# Patient Record
Sex: Male | Born: 1955
Health system: Southern US, Community
[De-identification: ages and names within clinical notes are randomized; demographics above are authoritative.]

## PROBLEM LIST (undated history)

## (undated) DIAGNOSIS — R569 Unspecified convulsions: Secondary | ICD-10-CM

## (undated) DIAGNOSIS — E039 Hypothyroidism, unspecified: Secondary | ICD-10-CM

## (undated) DIAGNOSIS — E109 Type 1 diabetes mellitus without complications: Secondary | ICD-10-CM

## (undated) DIAGNOSIS — N289 Disorder of kidney and ureter, unspecified: Secondary | ICD-10-CM

## (undated) DIAGNOSIS — F329 Major depressive disorder, single episode, unspecified: Secondary | ICD-10-CM

## (undated) DIAGNOSIS — M4800 Spinal stenosis, site unspecified: Secondary | ICD-10-CM

## (undated) DIAGNOSIS — J45909 Unspecified asthma, uncomplicated: Secondary | ICD-10-CM

## (undated) DIAGNOSIS — E785 Hyperlipidemia, unspecified: Secondary | ICD-10-CM

## (undated) DIAGNOSIS — J209 Acute bronchitis, unspecified: Secondary | ICD-10-CM

## (undated) DIAGNOSIS — J189 Pneumonia, unspecified organism: Secondary | ICD-10-CM

## (undated) DIAGNOSIS — N529 Male erectile dysfunction, unspecified: Secondary | ICD-10-CM

## (undated) DIAGNOSIS — G5600 Carpal tunnel syndrome, unspecified upper limb: Secondary | ICD-10-CM

## (undated) DIAGNOSIS — N320 Bladder-neck obstruction: Secondary | ICD-10-CM

## (undated) DIAGNOSIS — M5090 Cervical disc disorder, unspecified, unspecified cervical region: Secondary | ICD-10-CM

## (undated) DIAGNOSIS — I2510 Atherosclerotic heart disease of native coronary artery without angina pectoris: Secondary | ICD-10-CM

## (undated) DIAGNOSIS — Z87442 Personal history of urinary calculi: Secondary | ICD-10-CM

## (undated) DIAGNOSIS — E1029 Type 1 diabetes mellitus with other diabetic kidney complication: Secondary | ICD-10-CM

## (undated) DIAGNOSIS — E1039 Type 1 diabetes mellitus with other diabetic ophthalmic complication: Secondary | ICD-10-CM

## (undated) DIAGNOSIS — F32A Depression, unspecified: Secondary | ICD-10-CM

## (undated) DIAGNOSIS — E08319 Diabetes mellitus due to underlying condition with unspecified diabetic retinopathy without macular edema: Secondary | ICD-10-CM

## (undated) DIAGNOSIS — E1065 Type 1 diabetes mellitus with hyperglycemia: Secondary | ICD-10-CM

## (undated) DIAGNOSIS — F419 Anxiety disorder, unspecified: Secondary | ICD-10-CM

## (undated) DIAGNOSIS — J449 Chronic obstructive pulmonary disease, unspecified: Secondary | ICD-10-CM

## (undated) DIAGNOSIS — F411 Generalized anxiety disorder: Secondary | ICD-10-CM

## (undated) DIAGNOSIS — J42 Unspecified chronic bronchitis: Secondary | ICD-10-CM

## (undated) HISTORY — DX: Disorder of kidney and ureter, unspecified: N28.9

## (undated) HISTORY — DX: Male erectile dysfunction, unspecified: N52.9

## (undated) HISTORY — DX: Carpal tunnel syndrome, unspecified upper limb: G56.00

## (undated) HISTORY — DX: Type 1 diabetes mellitus with other diabetic ophthalmic complication: E10.39

## (undated) HISTORY — PX: LYMPH NODE DISSECTION: SHX5087

## (undated) HISTORY — DX: Cervical disc disorder, unspecified, unspecified cervical region: M50.90

## (undated) HISTORY — PX: VITRECTOMY: SHX106

## (undated) HISTORY — PX: APPENDECTOMY: SHX54

## (undated) HISTORY — PX: CATARACT EXTRACTION W/ INTRAOCULAR LENS  IMPLANT, BILATERAL: SHX1307

## (undated) HISTORY — DX: Major depressive disorder, single episode, unspecified: F32.9

## (undated) HISTORY — DX: Acute bronchitis, unspecified: J20.9

## (undated) HISTORY — DX: Hyperlipidemia, unspecified: E78.5

## (undated) HISTORY — DX: Type 1 diabetes mellitus without complications: E10.9

## (undated) HISTORY — DX: Atherosclerotic heart disease of native coronary artery without angina pectoris: I25.10

## (undated) HISTORY — DX: Bladder-neck obstruction: N32.0

## (undated) HISTORY — DX: Type 1 diabetes mellitus with other diabetic kidney complication: E10.29

## (undated) HISTORY — DX: Type 1 diabetes mellitus with hyperglycemia: E10.65

## (undated) HISTORY — PX: TONSILLECTOMY: SUR1361

## (undated) HISTORY — DX: Generalized anxiety disorder: F41.1

## (undated) HISTORY — PX: BACK SURGERY: SHX140

## (undated) HISTORY — DX: Unspecified asthma, uncomplicated: J45.909

---

## 1989-03-19 HISTORY — PX: CARDIAC CATHETERIZATION: SHX172

## 1998-06-05 ENCOUNTER — Ambulatory Visit (HOSPITAL_COMMUNITY): Admission: RE | Admit: 1998-06-05 | Discharge: 1998-06-05 | Payer: Self-pay | Admitting: Rheumatology

## 1998-06-05 ENCOUNTER — Encounter: Payer: Self-pay | Admitting: Rheumatology

## 1998-07-19 HISTORY — PX: ANTERIOR CERVICAL DECOMP/DISCECTOMY FUSION: SHX1161

## 2000-06-03 ENCOUNTER — Emergency Department (HOSPITAL_COMMUNITY): Admission: EM | Admit: 2000-06-03 | Discharge: 2000-06-03 | Payer: Self-pay | Admitting: Emergency Medicine

## 2000-06-03 ENCOUNTER — Encounter: Payer: Self-pay | Admitting: Emergency Medicine

## 2000-06-09 ENCOUNTER — Encounter: Payer: Self-pay | Admitting: Emergency Medicine

## 2000-06-09 ENCOUNTER — Inpatient Hospital Stay (HOSPITAL_COMMUNITY): Admission: EM | Admit: 2000-06-09 | Discharge: 2000-06-11 | Payer: Self-pay | Admitting: Emergency Medicine

## 2000-06-10 ENCOUNTER — Encounter: Payer: Self-pay | Admitting: Emergency Medicine

## 2000-06-10 ENCOUNTER — Encounter: Payer: Self-pay | Admitting: Internal Medicine

## 2000-06-16 ENCOUNTER — Emergency Department (HOSPITAL_COMMUNITY): Admission: EM | Admit: 2000-06-16 | Discharge: 2000-06-16 | Payer: Self-pay | Admitting: *Deleted

## 2000-06-16 ENCOUNTER — Encounter: Payer: Self-pay | Admitting: Neurological Surgery

## 2000-12-26 ENCOUNTER — Encounter: Admission: RE | Admit: 2000-12-26 | Discharge: 2001-02-15 | Payer: Self-pay | Admitting: Endocrinology

## 2002-09-06 HISTORY — PX: OTHER SURGICAL HISTORY: SHX169

## 2002-09-28 ENCOUNTER — Encounter: Payer: Self-pay | Admitting: Cardiology

## 2002-09-28 ENCOUNTER — Ambulatory Visit (HOSPITAL_COMMUNITY): Admission: RE | Admit: 2002-09-28 | Discharge: 2002-09-28 | Payer: Self-pay | Admitting: Cardiology

## 2002-11-07 ENCOUNTER — Encounter: Admission: RE | Admit: 2002-11-07 | Discharge: 2003-02-05 | Payer: Self-pay | Admitting: Endocrinology

## 2004-06-24 ENCOUNTER — Ambulatory Visit: Payer: Self-pay | Admitting: Endocrinology

## 2004-08-24 ENCOUNTER — Ambulatory Visit: Payer: Self-pay | Admitting: Endocrinology

## 2004-08-25 ENCOUNTER — Ambulatory Visit: Payer: Self-pay | Admitting: Endocrinology

## 2004-08-31 ENCOUNTER — Ambulatory Visit: Payer: Self-pay | Admitting: Endocrinology

## 2004-10-06 ENCOUNTER — Ambulatory Visit: Payer: Self-pay | Admitting: Endocrinology

## 2004-10-08 ENCOUNTER — Ambulatory Visit: Payer: Self-pay | Admitting: Endocrinology

## 2004-10-13 ENCOUNTER — Ambulatory Visit: Payer: Self-pay | Admitting: Endocrinology

## 2004-11-18 ENCOUNTER — Ambulatory Visit: Payer: Self-pay | Admitting: Internal Medicine

## 2004-11-23 ENCOUNTER — Ambulatory Visit: Payer: Self-pay | Admitting: Endocrinology

## 2005-02-23 ENCOUNTER — Ambulatory Visit: Payer: Self-pay | Admitting: Internal Medicine

## 2005-03-10 ENCOUNTER — Ambulatory Visit: Payer: Self-pay | Admitting: Endocrinology

## 2005-03-11 ENCOUNTER — Ambulatory Visit: Payer: Self-pay | Admitting: Endocrinology

## 2005-05-30 ENCOUNTER — Emergency Department (HOSPITAL_COMMUNITY): Admission: EM | Admit: 2005-05-30 | Discharge: 2005-05-30 | Payer: Self-pay | Admitting: Family Medicine

## 2005-06-03 ENCOUNTER — Ambulatory Visit: Payer: Self-pay | Admitting: Pulmonary Disease

## 2005-06-07 ENCOUNTER — Ambulatory Visit: Payer: Self-pay | Admitting: Endocrinology

## 2005-06-21 ENCOUNTER — Ambulatory Visit: Payer: Self-pay | Admitting: Endocrinology

## 2005-07-06 ENCOUNTER — Ambulatory Visit: Payer: Self-pay | Admitting: Critical Care Medicine

## 2005-07-08 ENCOUNTER — Ambulatory Visit: Payer: Self-pay | Admitting: Critical Care Medicine

## 2005-08-24 ENCOUNTER — Ambulatory Visit: Payer: Self-pay | Admitting: Endocrinology

## 2005-08-25 ENCOUNTER — Ambulatory Visit: Payer: Self-pay | Admitting: Endocrinology

## 2005-09-15 ENCOUNTER — Ambulatory Visit: Payer: Self-pay | Admitting: Internal Medicine

## 2005-11-22 ENCOUNTER — Ambulatory Visit: Payer: Self-pay | Admitting: Endocrinology

## 2005-12-08 ENCOUNTER — Ambulatory Visit: Payer: Self-pay | Admitting: Endocrinology

## 2005-12-16 ENCOUNTER — Ambulatory Visit: Payer: Self-pay | Admitting: Endocrinology

## 2005-12-28 ENCOUNTER — Ambulatory Visit: Payer: Self-pay | Admitting: Endocrinology

## 2005-12-30 ENCOUNTER — Ambulatory Visit (HOSPITAL_COMMUNITY): Admission: RE | Admit: 2005-12-30 | Discharge: 2005-12-30 | Payer: Self-pay | Admitting: Endocrinology

## 2006-03-10 ENCOUNTER — Ambulatory Visit: Payer: Self-pay | Admitting: Endocrinology

## 2006-05-06 ENCOUNTER — Ambulatory Visit: Payer: Self-pay | Admitting: Endocrinology

## 2006-05-06 LAB — CONVERTED CEMR LAB: Hgb A1c MFr Bld: 8.6 % — ABNORMAL HIGH (ref 4.6–6.0)

## 2006-10-11 ENCOUNTER — Ambulatory Visit: Payer: Self-pay | Admitting: Endocrinology

## 2006-11-16 ENCOUNTER — Ambulatory Visit: Payer: Self-pay | Admitting: Endocrinology

## 2006-11-16 LAB — CONVERTED CEMR LAB
Basophils Relative: 0.8 % (ref 0.0–1.0)
CO2: 33 meq/L — ABNORMAL HIGH (ref 19–32)
Creatinine, Ser: 0.8 mg/dL (ref 0.4–1.5)
Creatinine,U: 86.2 mg/dL
HCT: 43.5 % (ref 39.0–52.0)
HDL: 38.5 mg/dL — ABNORMAL LOW (ref >39.0)
Hemoglobin, Urine: NEGATIVE
Hemoglobin: 15.2 g/dL (ref 13.0–17.0)
Leukocytes, UA: NEGATIVE
Microalb, Ur: 4.4 mg/dL — ABNORMAL HIGH (ref 0.0–1.9)
Monocytes Absolute: 0.5 10*3/uL (ref 0.2–0.7)
Neutrophils Relative %: 50.7 % (ref 43.0–77.0)
Potassium: 4.5 meq/L (ref 3.5–5.1)
RDW: 11.9 % (ref 11.5–14.6)
Sodium: 138 meq/L (ref 135–145)
Specific Gravity, Urine: 1.02 (ref 1.000–1.03)
TSH: 3.87 microintl units/mL (ref 0.35–5.50)
Total Bilirubin: 1 mg/dL (ref 0.3–1.2)
Total Protein, Urine: NEGATIVE mg/dL
Total Protein: 5.9 g/dL — ABNORMAL LOW (ref 6.0–8.3)
Urobilinogen, UA: 0.2 (ref 0.0–1.0)
VLDL: 9 mg/dL (ref 0–40)
pH: 6 (ref 5.0–8.0)

## 2006-11-29 ENCOUNTER — Ambulatory Visit: Payer: Self-pay | Admitting: Endocrinology

## 2007-03-06 ENCOUNTER — Ambulatory Visit: Payer: Self-pay | Admitting: Endocrinology

## 2007-04-03 ENCOUNTER — Encounter: Payer: Self-pay | Admitting: Endocrinology

## 2007-04-03 DIAGNOSIS — E109 Type 1 diabetes mellitus without complications: Secondary | ICD-10-CM

## 2007-04-03 DIAGNOSIS — F411 Generalized anxiety disorder: Secondary | ICD-10-CM

## 2007-04-03 DIAGNOSIS — E119 Type 2 diabetes mellitus without complications: Secondary | ICD-10-CM

## 2007-04-03 DIAGNOSIS — Z794 Long term (current) use of insulin: Secondary | ICD-10-CM

## 2007-04-03 DIAGNOSIS — F32A Depression, unspecified: Secondary | ICD-10-CM | POA: Insufficient documentation

## 2007-04-03 DIAGNOSIS — E1139 Type 2 diabetes mellitus with other diabetic ophthalmic complication: Secondary | ICD-10-CM | POA: Insufficient documentation

## 2007-04-03 DIAGNOSIS — I2510 Atherosclerotic heart disease of native coronary artery without angina pectoris: Secondary | ICD-10-CM

## 2007-04-03 DIAGNOSIS — L301 Dyshidrosis [pompholyx]: Secondary | ICD-10-CM | POA: Insufficient documentation

## 2007-04-03 DIAGNOSIS — F3289 Other specified depressive episodes: Secondary | ICD-10-CM

## 2007-04-03 DIAGNOSIS — E08319 Diabetes mellitus due to underlying condition with unspecified diabetic retinopathy without macular edema: Secondary | ICD-10-CM

## 2007-04-03 DIAGNOSIS — F329 Major depressive disorder, single episode, unspecified: Secondary | ICD-10-CM

## 2007-04-03 DIAGNOSIS — H9319 Tinnitus, unspecified ear: Secondary | ICD-10-CM | POA: Insufficient documentation

## 2007-04-03 HISTORY — DX: Type 1 diabetes mellitus without complications: E10.9

## 2007-04-03 HISTORY — DX: Atherosclerotic heart disease of native coronary artery without angina pectoris: I25.10

## 2007-04-03 HISTORY — DX: Diabetes mellitus due to underlying condition with unspecified diabetic retinopathy without macular edema: E08.319

## 2007-04-03 HISTORY — DX: Other specified depressive episodes: F32.89

## 2007-04-03 HISTORY — DX: Generalized anxiety disorder: F41.1

## 2007-04-03 HISTORY — DX: Major depressive disorder, single episode, unspecified: F32.9

## 2007-04-04 ENCOUNTER — Ambulatory Visit: Payer: Self-pay | Admitting: Endocrinology

## 2007-04-04 ENCOUNTER — Encounter: Payer: Self-pay | Admitting: Endocrinology

## 2007-04-04 DIAGNOSIS — IMO0002 Reserved for concepts with insufficient information to code with codable children: Secondary | ICD-10-CM | POA: Insufficient documentation

## 2007-04-04 DIAGNOSIS — E11311 Type 2 diabetes mellitus with unspecified diabetic retinopathy with macular edema: Secondary | ICD-10-CM

## 2007-04-04 DIAGNOSIS — E1165 Type 2 diabetes mellitus with hyperglycemia: Secondary | ICD-10-CM

## 2007-04-04 DIAGNOSIS — E785 Hyperlipidemia, unspecified: Secondary | ICD-10-CM | POA: Insufficient documentation

## 2007-04-04 DIAGNOSIS — E1039 Type 1 diabetes mellitus with other diabetic ophthalmic complication: Secondary | ICD-10-CM

## 2007-04-04 DIAGNOSIS — E1029 Type 1 diabetes mellitus with other diabetic kidney complication: Secondary | ICD-10-CM | POA: Insufficient documentation

## 2007-04-04 DIAGNOSIS — E1065 Type 1 diabetes mellitus with hyperglycemia: Secondary | ICD-10-CM | POA: Insufficient documentation

## 2007-04-04 HISTORY — DX: Hyperlipidemia, unspecified: E78.5

## 2007-04-04 HISTORY — DX: Type 1 diabetes mellitus with hyperglycemia: E10.65

## 2007-04-04 HISTORY — DX: Type 1 diabetes mellitus with hyperglycemia: E10.29

## 2007-04-04 HISTORY — DX: Type 1 diabetes mellitus with other diabetic ophthalmic complication: E10.39

## 2007-04-11 ENCOUNTER — Ambulatory Visit (HOSPITAL_COMMUNITY): Admission: RE | Admit: 2007-04-11 | Discharge: 2007-04-12 | Payer: Self-pay | Admitting: Ophthalmology

## 2007-05-11 ENCOUNTER — Telehealth: Payer: Self-pay | Admitting: Endocrinology

## 2007-05-12 ENCOUNTER — Telehealth: Payer: Self-pay | Admitting: Endocrinology

## 2007-05-15 ENCOUNTER — Telehealth (INDEPENDENT_AMBULATORY_CARE_PROVIDER_SITE_OTHER): Payer: Self-pay | Admitting: *Deleted

## 2007-07-31 ENCOUNTER — Ambulatory Visit: Payer: Self-pay | Admitting: Internal Medicine

## 2007-07-31 DIAGNOSIS — J309 Allergic rhinitis, unspecified: Secondary | ICD-10-CM | POA: Insufficient documentation

## 2007-07-31 DIAGNOSIS — G5600 Carpal tunnel syndrome, unspecified upper limb: Secondary | ICD-10-CM

## 2007-07-31 DIAGNOSIS — M65839 Other synovitis and tenosynovitis, unspecified forearm: Secondary | ICD-10-CM | POA: Insufficient documentation

## 2007-07-31 DIAGNOSIS — N259 Disorder resulting from impaired renal tubular function, unspecified: Secondary | ICD-10-CM | POA: Insufficient documentation

## 2007-07-31 DIAGNOSIS — M65849 Other synovitis and tenosynovitis, unspecified hand: Secondary | ICD-10-CM

## 2007-07-31 HISTORY — DX: Carpal tunnel syndrome, unspecified upper limb: G56.00

## 2007-07-31 LAB — CONVERTED CEMR LAB
Creatinine, Ser: 0.8 mg/dL (ref 0.4–1.5)
Glucose, Bld: 287 mg/dL — ABNORMAL HIGH (ref 70–99)
HDL: 37.8 mg/dL — ABNORMAL LOW (ref >39.0)
Hgb A1c MFr Bld: 9.1 % — ABNORMAL HIGH (ref 4.6–6.0)
LDL Cholesterol: 85 mg/dL (ref 0–99)
Potassium: 4.8 meq/L (ref 3.5–5.1)
Sodium: 135 meq/L (ref 135–145)
Triglycerides: 48 mg/dL (ref 0–149)
VLDL: 10 mg/dL (ref 0–40)

## 2007-08-03 ENCOUNTER — Encounter: Payer: Self-pay | Admitting: Endocrinology

## 2007-08-28 ENCOUNTER — Ambulatory Visit: Payer: Self-pay | Admitting: Endocrinology

## 2007-08-28 DIAGNOSIS — R071 Chest pain on breathing: Secondary | ICD-10-CM | POA: Insufficient documentation

## 2007-09-01 ENCOUNTER — Encounter: Payer: Self-pay | Admitting: Endocrinology

## 2007-11-16 ENCOUNTER — Encounter: Payer: Self-pay | Admitting: Endocrinology

## 2007-11-16 ENCOUNTER — Encounter: Payer: Self-pay | Admitting: Internal Medicine

## 2007-12-07 ENCOUNTER — Encounter: Payer: Self-pay | Admitting: Endocrinology

## 2007-12-12 ENCOUNTER — Encounter: Payer: Self-pay | Admitting: Endocrinology

## 2008-01-24 ENCOUNTER — Telehealth (INDEPENDENT_AMBULATORY_CARE_PROVIDER_SITE_OTHER): Payer: Self-pay | Admitting: *Deleted

## 2008-02-05 ENCOUNTER — Telehealth (INDEPENDENT_AMBULATORY_CARE_PROVIDER_SITE_OTHER): Payer: Self-pay | Admitting: *Deleted

## 2008-02-19 ENCOUNTER — Telehealth (INDEPENDENT_AMBULATORY_CARE_PROVIDER_SITE_OTHER): Payer: Self-pay | Admitting: *Deleted

## 2008-02-21 ENCOUNTER — Ambulatory Visit: Payer: Self-pay | Admitting: Endocrinology

## 2008-02-21 ENCOUNTER — Encounter: Payer: Self-pay | Admitting: Endocrinology

## 2008-02-21 DIAGNOSIS — R21 Rash and other nonspecific skin eruption: Secondary | ICD-10-CM | POA: Insufficient documentation

## 2008-04-02 ENCOUNTER — Ambulatory Visit: Payer: Self-pay | Admitting: Endocrinology

## 2008-04-11 ENCOUNTER — Ambulatory Visit (HOSPITAL_COMMUNITY): Admission: RE | Admit: 2008-04-11 | Discharge: 2008-04-12 | Payer: Self-pay | Admitting: Ophthalmology

## 2008-04-15 ENCOUNTER — Ambulatory Visit: Payer: Self-pay | Admitting: Endocrinology

## 2008-04-15 DIAGNOSIS — N320 Bladder-neck obstruction: Secondary | ICD-10-CM | POA: Insufficient documentation

## 2008-04-22 ENCOUNTER — Encounter: Payer: Self-pay | Admitting: Endocrinology

## 2008-06-04 ENCOUNTER — Ambulatory Visit: Payer: Self-pay | Admitting: Endocrinology

## 2008-08-19 ENCOUNTER — Telehealth: Payer: Self-pay | Admitting: Endocrinology

## 2008-08-19 ENCOUNTER — Ambulatory Visit: Payer: Self-pay | Admitting: Endocrinology

## 2008-08-19 DIAGNOSIS — M25519 Pain in unspecified shoulder: Secondary | ICD-10-CM | POA: Insufficient documentation

## 2008-09-03 ENCOUNTER — Telehealth (INDEPENDENT_AMBULATORY_CARE_PROVIDER_SITE_OTHER): Payer: Self-pay | Admitting: *Deleted

## 2008-09-06 ENCOUNTER — Ambulatory Visit: Payer: Self-pay | Admitting: Endocrinology

## 2008-09-06 DIAGNOSIS — R05 Cough: Secondary | ICD-10-CM | POA: Insufficient documentation

## 2008-09-06 DIAGNOSIS — J45909 Unspecified asthma, uncomplicated: Secondary | ICD-10-CM

## 2008-09-06 DIAGNOSIS — R059 Cough, unspecified: Secondary | ICD-10-CM | POA: Insufficient documentation

## 2008-09-06 DIAGNOSIS — J45998 Other asthma: Secondary | ICD-10-CM | POA: Insufficient documentation

## 2008-09-06 HISTORY — DX: Unspecified asthma, uncomplicated: J45.909

## 2008-10-07 ENCOUNTER — Ambulatory Visit: Payer: Self-pay | Admitting: Endocrinology

## 2008-10-22 ENCOUNTER — Emergency Department (HOSPITAL_COMMUNITY): Admission: EM | Admit: 2008-10-22 | Discharge: 2008-10-22 | Payer: Self-pay | Admitting: Emergency Medicine

## 2008-10-23 ENCOUNTER — Telehealth (INDEPENDENT_AMBULATORY_CARE_PROVIDER_SITE_OTHER): Payer: Self-pay | Admitting: *Deleted

## 2008-10-25 ENCOUNTER — Ambulatory Visit: Payer: Self-pay | Admitting: Internal Medicine

## 2008-10-25 DIAGNOSIS — J449 Chronic obstructive pulmonary disease, unspecified: Secondary | ICD-10-CM | POA: Insufficient documentation

## 2008-10-25 DIAGNOSIS — J4489 Other specified chronic obstructive pulmonary disease: Secondary | ICD-10-CM | POA: Insufficient documentation

## 2008-10-25 DIAGNOSIS — J209 Acute bronchitis, unspecified: Secondary | ICD-10-CM

## 2008-10-25 DIAGNOSIS — R079 Chest pain, unspecified: Secondary | ICD-10-CM | POA: Insufficient documentation

## 2008-10-25 HISTORY — DX: Acute bronchitis, unspecified: J20.9

## 2008-11-04 ENCOUNTER — Telehealth (INDEPENDENT_AMBULATORY_CARE_PROVIDER_SITE_OTHER): Payer: Self-pay | Admitting: *Deleted

## 2008-11-11 ENCOUNTER — Ambulatory Visit: Payer: Self-pay | Admitting: Endocrinology

## 2008-11-11 LAB — CONVERTED CEMR LAB: Hgb A1c MFr Bld: 8.7 % — ABNORMAL HIGH (ref 4.6–6.5)

## 2008-11-13 ENCOUNTER — Telehealth: Payer: Self-pay | Admitting: Endocrinology

## 2008-12-01 ENCOUNTER — Emergency Department (HOSPITAL_COMMUNITY): Admission: EM | Admit: 2008-12-01 | Discharge: 2008-12-02 | Payer: Self-pay | Admitting: Emergency Medicine

## 2009-01-30 ENCOUNTER — Telehealth: Payer: Self-pay | Admitting: Endocrinology

## 2009-02-14 ENCOUNTER — Ambulatory Visit: Payer: Self-pay | Admitting: Internal Medicine

## 2009-02-17 ENCOUNTER — Telehealth (INDEPENDENT_AMBULATORY_CARE_PROVIDER_SITE_OTHER): Payer: Self-pay | Admitting: *Deleted

## 2009-04-15 ENCOUNTER — Ambulatory Visit: Payer: Self-pay | Admitting: Endocrinology

## 2009-04-15 ENCOUNTER — Telehealth: Payer: Self-pay | Admitting: Endocrinology

## 2009-04-17 LAB — CONVERTED CEMR LAB
Albumin: 4 g/dL (ref 3.5–5.2)
BUN: 8 mg/dL (ref 6–23)
Basophils Relative: 0.6 % (ref 0.0–3.0)
Bilirubin Urine: NEGATIVE
Calcium: 9.3 mg/dL (ref 8.4–10.5)
Cholesterol: 118 mg/dL (ref 0–200)
Creatinine, Ser: 0.9 mg/dL (ref 0.4–1.5)
Creatinine,U: 65.4 mg/dL
GFR calc non Af Amer: 93.44 mL/min (ref >60)
Glucose, Bld: 252 mg/dL — ABNORMAL HIGH (ref 70–99)
HDL: 35.5 mg/dL — ABNORMAL LOW (ref >39.00)
Hemoglobin, Urine: NEGATIVE
Hemoglobin: 15.7 g/dL (ref 13.0–17.0)
Lymphocytes Relative: 22.8 % (ref 12.0–46.0)
Microalb Creat Ratio: 15.3 mg/g (ref 0.0–30.0)
Microalb, Ur: 1 mg/dL (ref 0.0–1.9)
Monocytes Relative: 6.3 % (ref 3.0–12.0)
Neutro Abs: 4.5 10*3/uL (ref 1.4–7.7)
Neutrophils Relative %: 62.5 % (ref 43.0–77.0)
Nitrite: NEGATIVE
RBC: 4.77 M/uL (ref 4.22–5.81)
Sodium: 139 meq/L (ref 135–145)
TSH: 1.94 microintl units/mL (ref 0.35–5.50)
Triglycerides: 89 mg/dL (ref 0.0–149.0)
Urine Glucose: >=1000 mg/dL
Urobilinogen, UA: 0.2 (ref 0.0–1.0)
VLDL: 17.8 mg/dL (ref 0.0–40.0)
WBC: 7.1 10*3/uL (ref 4.5–10.5)

## 2009-04-23 ENCOUNTER — Telehealth (INDEPENDENT_AMBULATORY_CARE_PROVIDER_SITE_OTHER): Payer: Self-pay | Admitting: *Deleted

## 2009-05-06 ENCOUNTER — Ambulatory Visit: Payer: Self-pay | Admitting: Endocrinology

## 2009-05-09 ENCOUNTER — Telehealth: Payer: Self-pay | Admitting: Endocrinology

## 2009-05-20 ENCOUNTER — Telehealth (INDEPENDENT_AMBULATORY_CARE_PROVIDER_SITE_OTHER): Payer: Self-pay | Admitting: *Deleted

## 2009-07-16 ENCOUNTER — Telehealth (INDEPENDENT_AMBULATORY_CARE_PROVIDER_SITE_OTHER): Payer: Self-pay | Admitting: *Deleted

## 2009-10-21 ENCOUNTER — Encounter: Payer: Self-pay | Admitting: Endocrinology

## 2009-11-27 ENCOUNTER — Emergency Department (HOSPITAL_COMMUNITY): Admission: EM | Admit: 2009-11-27 | Discharge: 2009-11-27 | Payer: Self-pay | Admitting: Family Medicine

## 2009-12-23 ENCOUNTER — Ambulatory Visit: Payer: Self-pay | Admitting: Endocrinology

## 2009-12-24 LAB — CONVERTED CEMR LAB
ALT: 34 units/L (ref 0–53)
AST: 29 units/L (ref 0–37)
Alkaline Phosphatase: 100 units/L (ref 39–117)
CO2: 30 meq/L (ref 19–32)
Calcium: 9.5 mg/dL (ref 8.4–10.5)
Creatinine, Ser: 0.9 mg/dL (ref 0.4–1.5)
GFR calc non Af Amer: 92.02 mL/min (ref >60)
Glucose, Bld: 184 mg/dL — ABNORMAL HIGH (ref 70–99)
Sodium: 140 meq/L (ref 135–145)
Total Bilirubin: 1.1 mg/dL (ref 0.3–1.2)

## 2010-01-02 ENCOUNTER — Encounter: Payer: Self-pay | Admitting: Internal Medicine

## 2010-01-26 ENCOUNTER — Encounter: Payer: Self-pay | Admitting: Internal Medicine

## 2010-01-28 ENCOUNTER — Encounter: Payer: Self-pay | Admitting: Endocrinology

## 2010-01-28 ENCOUNTER — Telehealth: Payer: Self-pay | Admitting: Endocrinology

## 2010-01-29 ENCOUNTER — Telehealth: Payer: Self-pay | Admitting: Endocrinology

## 2010-02-03 ENCOUNTER — Ambulatory Visit: Payer: Self-pay | Admitting: Endocrinology

## 2010-02-05 ENCOUNTER — Telehealth: Payer: Self-pay | Admitting: Endocrinology

## 2010-02-06 ENCOUNTER — Telehealth: Payer: Self-pay | Admitting: Endocrinology

## 2010-02-17 ENCOUNTER — Encounter: Payer: Self-pay | Admitting: Endocrinology

## 2010-02-23 ENCOUNTER — Telehealth: Payer: Self-pay | Admitting: Endocrinology

## 2010-04-02 ENCOUNTER — Emergency Department (HOSPITAL_COMMUNITY): Admission: EM | Admit: 2010-04-02 | Discharge: 2010-04-02 | Payer: Self-pay | Admitting: Family Medicine

## 2010-04-07 ENCOUNTER — Ambulatory Visit: Payer: Self-pay | Admitting: Endocrinology

## 2010-04-07 DIAGNOSIS — S20219A Contusion of unspecified front wall of thorax, initial encounter: Secondary | ICD-10-CM | POA: Insufficient documentation

## 2010-04-22 ENCOUNTER — Telehealth: Payer: Self-pay | Admitting: Internal Medicine

## 2010-05-01 ENCOUNTER — Telehealth: Payer: Self-pay | Admitting: Endocrinology

## 2010-05-08 ENCOUNTER — Encounter: Payer: Self-pay | Admitting: Endocrinology

## 2010-05-08 ENCOUNTER — Ambulatory Visit: Payer: Self-pay | Admitting: Endocrinology

## 2010-05-08 DIAGNOSIS — Z87891 Personal history of nicotine dependence: Secondary | ICD-10-CM | POA: Insufficient documentation

## 2010-05-08 LAB — CONVERTED CEMR LAB
ALT: 41 units/L (ref 0–53)
Albumin: 4.1 g/dL (ref 3.5–5.2)
Basophils Relative: 0.6 % (ref 0.0–3.0)
Bilirubin Urine: NEGATIVE
Bilirubin, Direct: 0.1 mg/dL (ref 0.0–0.3)
CO2: 32 meq/L (ref 19–32)
Chloride: 101 meq/L (ref 96–112)
Creatinine, Ser: 0.8 mg/dL (ref 0.4–1.5)
Creatinine,U: 41.3 mg/dL
Eosinophils Absolute: 0.3 10*3/uL (ref 0.0–0.7)
Eosinophils Relative: 5 % (ref 0.0–5.0)
HCT: 41.7 % (ref 39.0–52.0)
Hemoglobin: 14.6 g/dL (ref 13.0–17.0)
Hgb A1c MFr Bld: 8.5 % — ABNORMAL HIGH (ref 4.6–6.5)
Ketones, ur: NEGATIVE mg/dL
LDL Cholesterol: 62 mg/dL (ref 0–99)
MCHC: 35 g/dL (ref 30.0–36.0)
MCV: 96.7 fL (ref 78.0–100.0)
Microalb, Ur: 2.5 mg/dL — ABNORMAL HIGH (ref 0.0–1.9)
Monocytes Absolute: 0.6 10*3/uL (ref 0.1–1.0)
Neutro Abs: 3.9 10*3/uL (ref 1.4–7.7)
Neutrophils Relative %: 57 % (ref 43.0–77.0)
Potassium: 4.3 meq/L (ref 3.5–5.1)
RBC: 4.32 M/uL (ref 4.22–5.81)
Sodium: 139 meq/L (ref 135–145)
Specific Gravity, Urine: 1.01 (ref 1.000–1.030)
Total CHOL/HDL Ratio: 2
Total Protein: 6.5 g/dL (ref 6.0–8.3)
Triglycerides: 38 mg/dL (ref 0.0–149.0)
Urine Glucose: 500 mg/dL
Urobilinogen, UA: 0.2 (ref 0.0–1.0)
WBC: 6.9 10*3/uL (ref 4.5–10.5)
pH: 6.5 (ref 5.0–8.0)

## 2010-05-26 ENCOUNTER — Telehealth: Payer: Self-pay | Admitting: Endocrinology

## 2010-08-07 ENCOUNTER — Ambulatory Visit
Admission: RE | Admit: 2010-08-07 | Discharge: 2010-08-07 | Payer: Self-pay | Source: Home / Self Care | Attending: Endocrinology | Admitting: Endocrinology

## 2010-08-07 ENCOUNTER — Other Ambulatory Visit: Payer: Self-pay | Admitting: Endocrinology

## 2010-08-07 ENCOUNTER — Telehealth: Payer: Self-pay | Admitting: Endocrinology

## 2010-08-07 LAB — HEMOGLOBIN A1C: Hgb A1c MFr Bld: 8.6 % — ABNORMAL HIGH (ref 4.6–6.5)

## 2010-08-09 ENCOUNTER — Encounter: Payer: Self-pay | Admitting: Endocrinology

## 2010-08-09 ENCOUNTER — Encounter: Payer: Self-pay | Admitting: Critical Care Medicine

## 2010-08-18 NOTE — Progress Notes (Signed)
  Phone Note Outgoing Call Call back at [facility] Phone [phone]   Call placed by: Brenton Grills MA,  January 28, 2010 4:33 PM Call placed to: Patient Details for Reason: Chantix rx Summary of Call: Called pt to inform that Chantix has been increased to two times a Miyamoto per Dr. Hurshel Keys answer--left message on VM to callback office  Follow-up for Phone Call        pt returned call would like rx sent to [facility]. Follow-up by: Brenton Grills MA,  January 29, 2010 9:05 AM    Prescriptions: CHANTIX CONTINUING MONTH PAK 1 MG TABS (VARENICLINE TARTRATE) 1 by mouth two times a [place]  #60 x 11   Entered by:   Brenton Grills MA   Authorized by:   Minus Breeding MD   Signed by:   Brenton Grills MA on 01/29/2010   Method used:   Electronically to        [facility]* (retail)       [address]       [address] Shipping/mailing       [place], [place]  [address]       Ph: [phone]       Fax: [phone]   RxID:   [id number]

## 2010-08-18 NOTE — Progress Notes (Signed)
Summary: medication refill  Phone Note Refill Request Message from:  Fax from Pharmacy on April 22, 2010 1:56 PM  Refills Requested: Medication #1:  ALPRAZOLAM 2 MG  TABS 1 by mouth at bedtime as needed  - pt to please make physical appt for oct 2011 for further refills with Dr Adair Patter   Dosage confirmed as above?Dosage Confirmed   Last Refilled: 02/05/2010   Notes: [facility] [phone] Initial call taken by: Zella Ball Ewing CMA Duncan Dull),  April 22, 2010 1:57 PM  Follow-up for Phone Call        too soon - not due until oct 19 Follow-up by: Corwin Levins MD,  April 22, 2010 5:00 PM

## 2010-08-18 NOTE — Progress Notes (Signed)
  Phone Note Call from Patient Call back at Home Phone [phone]   Caller: Patient Call For: Minus Breeding MD Summary of Call: Pt called requesting refill of ALPRAZOLAM 2 MG as he has 2 days left. Pt sched appt for 10/18 for CPX. Pt requesting 4 pills to get him through till appt for maintainance. Initial call taken by: Verdell Face,  May 01, 2010 3:56 PM  Follow-up for Phone Call        i refilled x 1 Follow-up by: Minus Breeding MD,  May 01, 2010 4:18 PM  Additional Follow-up for Phone Call Additional follow up Details #1::        Rx faxed to pharmacy, pt informed Additional Follow-up by: Margaret Pyle, CMA,  May 01, 2010 4:32 PM    Prescriptions: ALPRAZOLAM 2 MG  TABS (ALPRAZOLAM) 1 by mouth at bedtime as needed  - pt to please make physical appt for oct 2011 for further refills with Dr Adair Patter  #30 x 0   Entered and Authorized by:   Minus Breeding MD   Signed by:   Minus Breeding MD on 05/01/2010   Method used:   Print then Give to Patient   RxID:   [id number]

## 2010-08-18 NOTE — Assessment & Plan Note (Signed)
Summary: cpx/15 min slot ok per dah/cd   Vital Signs:  Patient profile:   55 year old male Height:      69 inches (175.26 cm) Weight:      176.50 pounds (80.23 kg) BMI:     26.16 O2 Sat:      97 % on Room air Temp:     98.6 degrees F (37.00 degrees C) oral Pulse rate:   70 / minute BP sitting:   108 / 64  (left arm) Cuff size:   regular  Vitals Entered By: Brenton Grills MA (May 08, 2010 2:03 PM)  O2 Flow:  Room air CC: Physical/pt is no longer taking Chantix/refill on Alprazolam/aj Is Patient Diabetic? Yes   Referring Alaynah Schutter:  matthews Primary Moritz Lever:  ellison  CC:  Physical/pt is no longer taking Chantix/refill on Alprazolam/aj.  History of Present Illness: no cbg record, but states cbg's are well-controlled, except for steriods given by ortho recently.     Current Medications (verified): 1)  Alprazolam 2 Mg  Tabs (Alprazolam) .[name].. 1 By Mouth At Bedtime As Needed  - Pt To Please Make Physical Appt For Oct 2011 For Further Refills With Dr Adair Patter 2)  Celexa 40 Mg  Tabs (Citalopram Hydrobromide) .[name].. 1 Qd 3)  Bd U/f Iii Short Pen Needle 31g X 8 Mm Misc (Insulin Pen Needle) .... Use As Directed 4)  Zocor 40 Mg  Tabs (Simvastatin) .[name].. 1 By Mouth At Bedtime 5)  Novolog Mix 70/30 Flexpen 70-30 % Susp (Insulin Aspart Prot & Aspart) .... 30 Units Qam    40 Units Qpm 6)  Chantix Continuing Month Pak 1 Mg Tabs (Varenicline Tartrate) .[name].. 1 By Mouth Two Times A Kagel 7)  Onetouch Ultra Test  Strp (Glucose Blood) .... Use 6 Times Daily  To Test Blood Glucose 8)  Onetouch Lancets  Misc (Lancets) .... Use 6 Times Daily To Test Blood Glucose 9)  Hydrocodone-Acetaminophen 5-325 Mg Tabs (Hydrocodone-Acetaminophen) .... 1/2 or 1 Pill Every 4 Hrs As Needed For Pain  Allergies (verified): No Known Drug Allergies  Past History:  Past Medical History: Last updated: 08/19/2008 Renal insufficiency medical non-compliance E.D. 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         chest pain and sob are attributed to injury, and are improving.    Physical Exam  General:  normal appearance.   Pulses:  dorsalis pedis intact bilat.  Extremities:  no deformity.  no ulcer on the feet.  feet are of normal color and temp.  no edema.  Neurologic:  sensation is intact to touch on the legs. Additional Exam:  Hemoglobin A1C       [H]  8.5 %    Impression & Recommendations:  Problem # 1:  DIABETES MELLITUS, TYPE I (ICD-250.01) well-controlled, considering the contribution of the recent steriods to hyperglycemia  Medications Added to Medication List This Visit: 1)  Alprazolam 2 Mg Tabs (Alprazolam) .[name].. 1 by mouth at bedtime as needed for anxiety or sleep  Other  Orders: EKG w/ Interpretation (93000) Admin 1st Vaccine (20254) Flu Vaccine 56yrs + (27062) TLB-Lipid Panel (80061-LIPID) TLB-BMP (Basic Metabolic Panel-BMET) (80048-METABOL) TLB-CBC Platelet - w/Differential (85025-CBCD) TLB-Hepatic/Liver Function Pnl (80076-HEPATIC) TLB-TSH (Thyroid Stimulating Hormone) (84443-TSH) TLB-A1C / Hgb A1C (Glycohemoglobin) (83036-A1C) TLB-PSA (Prostate Specific Antigen) (84153-PSA) TLB-Udip w/ Micro (81001-URINE) TLB-Microalbumin/Creat Ratio, Urine  (82043-MALB) Est. Patient Level III (37628)  Patient Instructions: 1)  blood tests are being ordered for you today.  please call [phone] to hear your test results. 2)  pending the test results, please continue the same medications for now. 3)  Please schedule a physical appointment in 3 months. 4)  (update: i left message on phone-tree:  rx as we discussed) Prescriptions: ALPRAZOLAM 2 MG  TABS (ALPRAZOLAM) 1 by mouth at bedtime as needed for anxiety or sleep  #30 x 5   Entered and Authorized by:   Minus Breeding MD   Signed by:   Minus Breeding MD on 05/08/2010   Method used:   Print then Give to Patient   RxID:   [id number]    Orders Added: 1)  EKG w/ Interpretation [93000] 2)  Admin 1st Vaccine [90471] 3)  Flu Vaccine 32yrs + [27035] 4)  TLB-Lipid Panel [80061-LIPID] 5)  TLB-BMP (Basic Metabolic Panel-BMET) [80048-METABOL] 6)  TLB-CBC Platelet - w/Differential [85025-CBCD] 7)  TLB-Hepatic/Liver Function Pnl [80076-HEPATIC] 8)  TLB-TSH (Thyroid Stimulating Hormone) [84443-TSH] 9)  TLB-A1C / Hgb A1C (Glycohemoglobin) [83036-A1C] 10)  TLB-PSA (Prostate Specific Antigen) [84153-PSA] 11)  TLB-Udip w/ Micro [81001-URINE] 12)  TLB-Microalbumin/Creat Ratio, Urine [82043-MALB] 13)  Est. Patient Level III [00938]  Flu Vaccine Consent Questions     Do you have a history of severe allergic reactions to this vaccine? no    Any prior history of allergic reactions to egg and/or gelatin? no    Do you have a sensitivity to the preservative Thimersol? no    Do you have a past history of Guillan-Barre Syndrome? no    Do you currently have an acute febrile illness? no    Have you ever had a severe reaction to latex? no    Vaccine information given and explained to patient? yes    Are you currently pregnant? no    Lot Number:[id number]   Exp Date:01/16/2011   Site Given  Left Deltoid IM      .[email or web address]

## 2010-08-18 NOTE — Progress Notes (Signed)
Summary: Xanax  ---- Converted from flag ---- ---- 02/04/2010 6:20 PM, Minus Breeding MD wrote: please call [facility].  was xanax rx transferred to a pharmacy at [facility].  if so, which one? ------------------------------  Phone Note Outgoing Call   Call placed by: Margaret Pyle, CMA,  February 05, 2010 10:34 AM Summary of Call: Fax was sent from [facility] stating that pt had Rx for Alprazolam transferred while on Vacation and once a Rx is transferred pt loses remaining refills. Rx was originally written by [facility] 01/01/2010 (SA[facility] out sick) for #30 x 2 and was only filled once on 01/01/2010.  JWJ authorized for a total of 2 fills to [facility] on 01/26/2010, faxed back on 01/27/2010.  *Please see scanned note dated 01/02/2010* Initial call taken by: Margaret Pyle, CMA,  February 05, 2010 10:36 AM  Follow-up for Phone Call        please ask pt where he was getting refills prior to june 2011 --   in retrospect I think I may have given refill on med that I thought he was getting here on regular basis , but may not have been (maybe from psychiatry?)  if was from psychiatry or other provider, I suggest he will need to request ongoing refills from one provider Follow-up by: Corwin Levins MD,  February 05, 2010 12:53 PM  Additional Follow-up for Phone Call Additional follow up Details #1::        pt gets refills here.  i will take care of it. Additional Follow-up by: Minus Breeding MD,  February 05, 2010 1:02 PM

## 2010-08-18 NOTE — Progress Notes (Signed)
Summary: novolog  Phone Note Call from Patient Call back at Home Phone [phone]   Caller: Patient Call For: Dr Everardo All Summary of Call: Pt called and states he has no refills on all of his medications related to his diabetes. [facility]. Pt leaving town tomorrow Initial call taken by: Verdell Face,  February 23, 2010 10:23 AM    Prescriptions: NOVOLOG MIX 70/30 FLEXPEN 70-30 % SUSP (INSULIN ASPART PROT & ASPART) 30 units qam    40 units qpm  #35mo supply x 1   Entered by:   Brenton Grills MA   Authorized by:   Minus Breeding MD   Signed by:   Brenton Grills MA on 02/23/2010   Method used:   Electronically to        [facility]* (retail)       [address]       [address] Shipping/mailing       [place], [place]  [address]       Ph: [phone]       Fax: [phone]   RxID:   [id number] U/F III SHORT PEN NEEDLE 31G X 8 MM MISC (INSULIN PEN NEEDLE) USE AS DIRECTED  #360 x 3   Entered by:   Brenton Grills MA   Authorized by:   Minus Breeding MD   Signed by:   Brenton Grills MA on 02/23/2010   Method used:   Electronically to        [facility]* (retail)       [address]       [address] Shipping/mailing       [place], [place]  [address]       Ph: [phone]       Fax: [phone]   RxID:   [id number]

## 2010-08-18 NOTE — Medication Information (Signed)
Summary: Alprazolam/[place]  Alprazolam/[place]   Imported By: Sherian Rein 02/09/2010 11:20:29  _____________________________________________________________________  External Attachment:    Type:   Image     Comment:   External Document

## 2010-08-18 NOTE — Progress Notes (Signed)
Summary: Chantix  Phone Note From Pharmacy   Caller: [facility]* Summary of Call: Lanora Manis called from pharmacy requesting Rx for Chantix be limited to only a 3 month supply. Pharmacist says this is due to recent study that says use for more that 3 month can increase risks for heart disease and pt has already been using medication for 1 year. I authorized the Rx for a 3 month supply only.  Initial call taken by: Margaret Pyle, CMA,  January 29, 2010 11:19 AM

## 2010-08-18 NOTE — Miscellaneous (Signed)
Summary: Controlled Substances Contract / [facility]  Controlled Substances Contract / [facility]   Imported By: Lennie Odor 05/12/2010 16:03:50  _____________________________________________________________________  External Attachment:    Type:   Image     Comment:   External Document

## 2010-08-18 NOTE — Progress Notes (Signed)
Summary: Rx refill req  Phone Note Refill Request Message from:  Fax from Pharmacy on May 26, 2010 3:40 PM  Refills Requested: Medication #1:  NOVOLOG MIX 70/30 FLEXPEN 70-30 % SUSP 30 units qam    40 units qpm   Dosage confirmed as above?Dosage Confirmed   Last Refilled: 05/26/2010  Medication #2:  ONETOUCH ULTRA TEST  STRP use 6 times daily  to test blood glucose   Dosage confirmed as above?Dosage Confirmed   Last Refilled: 02/23/2010  Method Requested: Electronic Next Appointment Scheduled: 08/17/2010 Initial call taken by: Brenton Grills CMA Duncan Dull),  May 26, 2010 3:42 PM    Prescriptions: NOVOLOG MIX 70/30 FLEXPEN 70-30 % SUSP (INSULIN ASPART PROT & ASPART) 30 units qam    40 units qpm  #50mo supply x 2   Entered by:   Brenton Grills CMA (AAMA)   Authorized by:   Minus Breeding MD   Signed by:   Brenton Grills CMA (AAMA) on 05/26/2010   Method used:   Electronically to        [facility]* (retail)       [address]       [address] Shipping/mailing       [place], [place]  [address]       Ph: [phone]       Fax: [phone]   RxID:   [id number] [place] TEST  STRP (GLUCOSE BLOOD) use 6 times daily  to test blood glucose  #540 x 3   Entered by:   Brenton Grills CMA (AAMA)   Authorized by:   Minus Breeding MD   Signed by:   Brenton Grills CMA (AAMA) on 05/26/2010   Method used:   Electronically to        [facility]* (retail)       [address]       [address] Shipping/mailing       [place], [place]  [address]       Ph: [phone]       Fax: [phone]   RxID:   [id number]
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
## 2010-08-18 NOTE — Assessment & Plan Note (Signed)
Summary: FU-STC   Vital Signs:  Patient profile:   55 year old male Height:      69 inches (175.26 cm) Weight:      169.0 pounds (76.82 kg) BMI:     25.05 O2 Sat:      97 % on Room air Temp:     97.1 degrees F (36.17 degrees C) oral Pulse rate:   76 / minute BP sitting:   102 / 62  (left arm) Cuff size:   regular  Vitals Entered By: Orlan Leavens (December 23, 2009 4:36 PM)  O2 Flow:  Room air CC: follow-up visit Is Patient Diabetic? Yes Did you bring your meter with you today? No   Referring Provider:  matthews Primary Provider:  Quadre Bristol  CC:  follow-up visit.  History of Present Illness: the status of at least 3 ongoing medical problems is addressed today: he is overdue for appointment.   no cbg record, but states cbg's are "pretty good." he has hypoglycemia sxs when he takes extra insulin in the evening.  he does not take his am insulin, as he skips meals often.   hyperkalemia:  he denies muscle cramps. anxiety:  improved recently.  he now works as a Administrator.  Current Medications (verified): 1)  Alprazolam 2 Mg  Tabs (Alprazolam) .[name].. 1 By Mouth At Bedtime Prn 2)  Celexa 40 Mg  Tabs (Citalopram Hydrobromide) .[name].. 1 Qd 3)  Bd U/f Iii Short Pen Needle 31g X 8 Mm Misc (Insulin Pen Needle) .... Use As Directed 4)  Advil 200 Mg  Tabs (Ibuprofen) 5)  Zocor 40 Mg  Tabs (Simvastatin) .[name].. 1 By Mouth At Bedtime 6)  Novolog Mix 70/30 Flexpen 70-30 % Susp (Insulin Aspart Prot & Aspart) .... 30 Units Qam    40 Units Qpm 7)  Chantix Continuing Month Pak 1 Mg Tabs (Varenicline Tartrate) .... Use Asd 1 By Mouth Once Daily 8)  Onetouch Ultra Test  Strp (Glucose Blood) .... Use 6 Times Daily  To Test Blood Glucose 9)  Onetouch Lancets  Misc (Lancets) .... Use 6 Times Daily To Test Blood Glucose 10)  Sudafed .... Twice Daily 11)  Symbicort 160-4.5 Mcg/act Aero (Budesonide-Formoterol Fumarate) .[name].. 1 Puff Bid 12)  Muxinex .[name].. 2 Daily  Allergies (verified): No Known Drug  Allergies  Past History:  Past Medical History: Last updated: 08/19/2008 Renal insufficiency medical non-compliance E.D. 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 brother with DM, CHF, s/p kidney/pancreatic transplant strong FH cerebrovascular disease father with stroke  Review of Systems  The patient denies chest pain and syncope.    Physical Exam  General:  normal appearance.   Pulses:  dorsalis pedis intact bilat.  Extremities:  no deformity.  no ulcer on the feet.  feet are of normal color and temp.  no edema.  Neurologic:  sensation is intact to touch on the feet Psych:  Alert and cooperative; normal mood and affect; normal attention span and concentration.   Additional Exam:  a1c=8.4 at "[facility]" few weeks ago,  per pt.    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  the best remedy for this is to maximally simplify his insulin  schedule.  Problem # 3:  ANXIETY (ICD-300.00) this significantly limits the rx of #1  Problem # 4:  hyperkalemia improved  Other Orders: TLB-Hepatic/Liver Function Pnl (80076-HEPATIC) TLB-BMP (Basic Metabolic Panel-BMET) (80048-METABOL) Est. Patient Level IV (35009)  Patient Instructions: 1)  due to your low-normal blood pressure, the blood pressure medication is not an option for you now.  2)  blood tests are being ordered for you today.  please call [phone] to hear your test results. 3)  for best results, you should eat 3 meals per Stavros, and take insulin as prescribed. 4)  check your blood sugar 4 times a Nesmith--before the 3 meals, and at bedtime.  also check if you have symptoms of your blood sugar being too high or too low.  please keep a record of the readings and bring it to your next appointment here.  please call us sooner if you are having low blood sugar episodes. 5)  good diet and exercise habits significanly improve the control of your diabetes.  please let me know if you wish to be referred to a dietician.  high blood sugar is very risky to your health.  you should see an eye doctor every year. 6)  controlling your cholesterol drastically reduces the damage diabetes does to your body.  this also applies to quitting smoking.  you should take an aspirin every C[place]kowski, unless you have been advised by a doctor not to. 7)  (update: i left message on phone-tree:  rx as we discussed)

## 2010-08-18 NOTE — Progress Notes (Signed)
Summary: Back pain  Phone Note Call from Patient Call back at Home Phone [phone]   Caller: Patient VM OK Summary of Call: Pt called stating that he declined prescription for back pain at last OV but is now having increased pain. Pt is requesting Rx to treat, please advise. Initial call taken by: Margaret Pyle, CMA,  February 06, 2010 10:03 AM  Follow-up for Phone Call        take ibuprofen 3x200 mg three times a Uncapher as needed.  also take prilosec while on this, to protect your stomach.   i chacked several other medications, but you can't take them, as they interact with your other medications Follow-up by: Minus Breeding MD,  February 06, 2010 12:45 PM  Additional Follow-up for Phone Call Additional follow up Details #1::        Pt advised via VM and told to call if any other questions or concerns or is medications don't help. Additional Follow-up by: Margaret Pyle, CMA,  February 06, 2010 3:26 PM

## 2010-08-18 NOTE — Assessment & Plan Note (Signed)
Summary: fall last week/#/cd   Vital Signs:  Patient profile:   55 year old male Height:      69 inches (175.26 cm) Weight:      170 pounds (77.27 kg) BMI:     25.20 O2 Sat:      95 % on Room air Temp:     96.9 degrees F (36.06 degrees C) oral Pulse rate:   65 / minute BP sitting:   100 / 60  (left arm) Cuff size:   regular  Vitals Entered By: Brenton Grills MA (February 03, 2010 1:35 PM)  O2 Flow:  Room air CC: Pt fell last week/lower back pain/aj Is Patient Diabetic? Yes Comments Pt is no longer tkaing Sudafed, Symbicort, or Mucinex   Referring Provider:  matthews Primary Provider:  Kendre Sires  CC:  Pt fell last week/lower back pain/aj.  History of Present Illness: pt states he feels well in general.  no cbg record, but states cbg's are well-controlled.  he takes a varying dosage of insulin. pt says xanax works well, but rx was transferred from [facility] to [facility].   pt states 1 week of pain at lower back, in the context of falling of a riding mower.  no assoc numbness.  he feels better now.     Current Medications (verified): 1)  Alprazolam 2 Mg  Tabs (Alprazolam) .[name].. 1 By Mouth At Bedtime As Needed 2)  Celexa 40 Mg  Tabs (Citalopram Hydrobromide) .[name].. 1 Qd 3)  Bd U/f Iii Short Pen Needle 31g X 8 Mm Misc (Insulin Pen Needle) .... Use As Directed 4)  Advil 200 Mg  Tabs (Ibuprofen) 5)  Zocor 40 Mg  Tabs (Simvastatin) .[name].. 1 By Mouth At Bedtime 6)  Novolog Mix 70/30 Flexpen 70-30 % Susp (Insulin Aspart Prot & Aspart) .... 30 Units Qam    40 Units Qpm 7)  Chantix Continuing Month Pak 1 Mg Tabs (Varenicline Tartrate) .[name].. 1 By Mouth Two Times A Selsor 8)  Onetouch Ultra Test  Strp (Glucose Blood) .... Use 6 Times Daily  To Test Blood Glucose 9)  Onetouch Lancets  Misc (Lancets) .... Use 6 Times Daily To Test Blood Glucose 10)  Sudafed .... Twice Daily 11)  Symbicort 160-4.5 Mcg/act Aero (Budesonide-Formoterol Fumarate) .[name].. 1 Puff Bid 12)  Muxinex .[name].. 2  Daily  Allergies (verified): No Known Drug Allergies  Past History:  Past Medical History: Last updated: 08/19/2008 Renal insufficiency medical non-compliance E.D. 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    Physical Exam  General:  normal appearance.   Msk:  gait is normal and steady back is nontender. Neurologic:  sensation is intact to touch on the legs Skin:  insulin injection sites at anterior abdomen are normal    Impression & Recommendations:  Problem # 1:  DIABETES MELLITUS, TYPE I (ICD-250.01) needs increased rx  Problem # 2:  back contusion new  Problem # 3:  ANXIETY (ICD-300.00) well-controlled  Other Orders: Orthopedic Surgeon Referral (Ortho Surgeon) TLB-A1C / Hgb  A1C (Glycohemoglobin) (83036-A1C) Est. Patient Level IV (82956)  Patient Instructions: 1)  blood tests are being ordered for you today.  please call [phone] to hear your test results. 2)  for best results, you should eat 3 meals per Ruggiero, and take insulin as prescribed. 3)  check your blood sugar 4 times a Kotas--before the 3 meals, and at bedtime.  also check if you have symptoms of your blood sugar being too high or too low.  please keep a record of the readings and bring it to your next  appointment here.  please call us sooner if you are having low blood sugar episodes. 4)  Please schedule a physical appointment in 3 months. 5)  it you take advil, you should take prilosec along with it, to protect your stomach.

## 2010-08-18 NOTE — Letter (Signed)
Summary: [facility]  [facility]   Imported By: Lester Cypress 10/23/2009 10:56:46  _____________________________________________________________________  External Attachment:    Type:   Image     Comment:   External Document

## 2010-08-18 NOTE — Letter (Signed)
Summary: [facility]  [facility]   Imported By: Sherian Rein 02/25/2010 15:02:02  _____________________________________________________________________  External Attachment:    Type:   Image     Comment:   External Document

## 2010-08-18 NOTE — Medication Information (Signed)
Summary: Early refill/[facility]  Early refill/[facility]   Imported By: Lester Brookhaven 01/29/2010 08:12:19  _____________________________________________________________________  External Attachment:    Type:   Image     Comment:   External Document

## 2010-08-18 NOTE — Miscellaneous (Signed)
  Medications Added CHANTIX CONTINUING MONTH PAK 1 MG TABS (VARENICLINE TARTRATE) 1 by mouth two times a Meegan       Clinical Lists Changes  Medications: Changed medication from CHANTIX CONTINUING MONTH PAK 1 MG TABS (VARENICLINE TARTRATE) use asd 1 by mouth once daily to CHANTIX CONTINUING MONTH PAK 1 MG TABS (VARENICLINE TARTRATE) 1 by mouth two times a Morss - Signed Rx of CHANTIX CONTINUING MONTH PAK 1 MG TABS (VARENICLINE TARTRATE) 1 by mouth two times a Widrig;  #60 x 11;  Signed;  Entered by: Minus Breeding MD;  Authorized by: Minus Breeding MD;  Method used: Electronically to [facility]  [phone]*, [address], [place], [place], [place]  [address], Ph: [phone] or [phone], Fax: [phone]    Prescriptions: CHANTIX CONTINUING MONTH PAK 1 MG TABS (VARENICLINE TARTRATE) 1 by mouth two times a Turvey  #60 x 11   Entered and Authorized by:   Minus Breeding MD   Signed by:   Minus Breeding MD on 01/28/2010   Method used:   Electronically to        [facility]  [phone]* (retail)       [address]       [place], [place]  [address]       Ph: [phone] or [phone]       Fax: [phone]   RxID:   [id number]

## 2010-08-20 NOTE — Progress Notes (Signed)
Summary: citalopram  Phone Note Refill Request Message from:  Fax from Pharmacy on August 07, 2010 9:21 AM  Refills Requested: Medication #1:  CELEXA 40 MG  TABS 1 qd [facility]  Initial call taken by: Orlan Leavens RMA,  August 07, 2010 9:21 AM    Prescriptions: CELEXA 40 MG  TABS (CITALOPRAM HYDROBROMIDE) 1 qd  #90 x 2   Entered by:   Orlan Leavens RMA   Authorized by:   Minus Breeding MD   Signed by:   Orlan Leavens RMA on 08/07/2010   Method used:   Electronically to        [facility]* (retail)       [address]       [address] Shipping/mailing       [place], [place]  [address]       Ph: [phone]       Fax: [phone]   RxID:   [id number]

## 2010-08-20 NOTE — Assessment & Plan Note (Signed)
Summary: 3 MTH FU  STC   Vital Signs:  Patient profile:   55 year old male Height:      69 inches (175.26 cm) Weight:      182.13 pounds (82.79 kg) BMI:     26.99 O2 Sat:      93 % on Room air Temp:     98.5 degrees F (36.94 degrees C) oral Pulse rate:   74 / minute Pulse rhythm:   regular BP sitting:   108 / 72  (left arm) Cuff size:   regular  Vitals Entered By: Brenton Grills CMA Duncan Dull) (August 07, 2010 2:56 PM)  O2 Flow:  Room air CC: 3 month F/U/aj Is Patient Diabetic? Yes   Referring Provider:  matthews Primary Provider:  Cecilee Rosner  CC:  3 month F/U/aj.  History of Present Illness: no cbg record, but states cbg's are usually well-controlled.  it is higher in am than leter in the Hutto.  it hits 200 only seldom.  he takes a varying dosage of insulin.   pt states few mos of moderate dry skin of the ankles and hips, and assoc itching.  hc cream works.  no rash at other areas.   celexa works well.  Current Medications (verified): 1)  Alprazolam 2 Mg  Tabs (Alprazolam) .[name].. 1 By Mouth At Bedtime As Needed For Anxiety or Sleep 2)  Celexa 40 Mg  Tabs (Citalopram Hydrobromide) .[name].. 1 Qd 3)  Bd U/f Iii Short Pen Needle 31g X 8 Mm Misc (Insulin Pen Needle) .... Use As Directed 4)  Zocor 40 Mg  Tabs (Simvastatin) .[name].. 1 By Mouth At Bedtime 5)  Novolog Mix 70/30 Flexpen 70-30 % Susp (Insulin Aspart Prot & Aspart) .... 30 Units Qam    40 Units Qpm 6)  Onetouch Ultra Test  Strp (Glucose Blood) .... Use 6 Times Daily  To Test Blood Glucose 7)  Onetouch Lancets  Misc (Lancets) .... Use 6 Times Daily To Test Blood Glucose 8)  Alcohol Preps  Pads (Alcohol Swabs) .... 4/Bruns  Allergies (verified): No Known Drug Allergies  Past History:  Past Medical History: Last updated: 08/19/2008 Renal insufficiency medical non-compliance E.D. 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    Physical Exam  General:  normal appearance.   Pulses:  dorsalis pedis intact bilat.  Extremities:  no deformity.  no ulcer on the feet.  feet are of normal color and temp.  no edema. 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    Neurologic:  sensation is intact to touch on the legs.   Impression & Recommendations:  Problem # 1:  DIABETES MELLITUS, TYPE I (ICD-250.01) therapy limited by pt's need for a simple regimen.  Problem # 2:  RASH AND OTHER NONSPECIFIC SKIN ERUPTION (ICD-782.1) is prob due to dyshidrosis. i don't see any psoriasis,  but this could develop.  Problem # 3:  DEPRESSION (ICD-311) well-controlled  Other Orders: TLB-A1C / Hgb A1C (Glycohemoglobin) (83036-A1C) Est. Patient Level IV ([address])  Patient Instructions: 1)  continue hydrocortisone cream for your rash.   2)  same celexa. 3)  Please schedule a physical appointment in 3 months.   4)  blood tests are being ordered for you today.  please call [phone] to hear your test results. 5)  pending the test results, please continue the same medications for now. 6)  check your  blood sugar 4 times a Gandolfi--before the 3 meals, and at bedtime.  also check if you have symptoms of your blood sugar being too high or too low.  please keep a record of the readings and bring it to your next appointment here.  please call us sooner if you are having low blood sugar episodes. 7)  (update: i left message on phone-tree:  i need cbg info, and consistent insulin dosing in order to safely improve a1c) Prescriptions: CELEXA 40 MG  TABS (CITALOPRAM HYDROBROMIDE) 1 qd  #90 x 3   Entered and Authorized by:   Minus Breeding MD   Signed by:   Minus Breeding MD on 08/07/2010   Method used:   Electronically to        [facility]* (retail)       [address]       [address] Shipping/mailing       [place], [place]  [address]       Ph: [phone]       Fax: [phone]   RxID:   [id number]    Orders Added: 1)  TLB-A1C / Hgb A1C (Glycohemoglobin) [83036-A1C] 2)  Est. Patient Level IV [25366]

## 2010-10-01 LAB — POCT URINALYSIS DIPSTICK
Bilirubin Urine: NEGATIVE
Glucose, UA: 500 mg/dL — AB
Ketones, ur: NEGATIVE mg/dL
Nitrite: NEGATIVE

## 2010-10-12 ENCOUNTER — Encounter: Payer: Self-pay | Admitting: Endocrinology

## 2010-10-12 ENCOUNTER — Ambulatory Visit (INDEPENDENT_AMBULATORY_CARE_PROVIDER_SITE_OTHER): Payer: 59 | Admitting: Endocrinology

## 2010-10-12 VITALS — BP 124/68 | HR 75 | Temp 98.0°F | Ht 69.0 in | Wt 186.4 lb

## 2010-10-12 DIAGNOSIS — J45909 Unspecified asthma, uncomplicated: Secondary | ICD-10-CM

## 2010-10-12 MED ORDER — ALBUTEROL SULFATE HFA 108 (90 BASE) MCG/ACT IN AERS: 2.0000 | INHALATION_SPRAY | RESPIRATORY_TRACT | Status: DC | PRN

## 2010-10-12 MED ORDER — INSULIN ASPART PROT & ASPART (70-30 MIX) 100 UNIT/ML ~~LOC~~ SUSP: 25.0000 [IU] | Freq: Two times a day (BID) | SUBCUTANEOUS | Status: DC

## 2010-10-12 MED ORDER — FLUTICASONE-SALMETEROL 100-50 MCG/DOSE IN AEPB: 1.0000 | INHALATION_SPRAY | Freq: Two times a day (BID) | RESPIRATORY_TRACT | Status: DC

## 2010-10-12 MED ORDER — CITALOPRAM HYDROBROMIDE 20 MG PO TABS: ORAL_TABLET | ORAL | Status: DC

## 2010-10-12 NOTE — Patient Instructions (Addendum)
here is a sample of "advair-250."  take 1 puff 2x a Sotto.  rinse mouth after using.   i have sent prescriptions to your pharmacy for both the advair-100, and for albuterol (as needed inhaler). Please change insulin to 25 units with 1st and last meals of the Lopp.  Call if this causes hypoglycemia. check your blood sugar 5 times a Neuwirth.  vary the time of Kempfer when you check, between before the 3 meals, and at bedtime.  also check if you have symptoms of your blood sugar being too high or too low.  please keep a record of the readings and bring it to your next appointment here.  please call us sooner if you are having low blood sugar episodes. Please return here in 1 month. Increase citalopram to 3x20 mg daily

## 2010-10-12 NOTE — Progress Notes (Signed)
  Subjective:    Patient ID: James [name], male    DOB: 11/28/1955, 55 y.o.   MRN: [id number]  HPI Pt states few weeks of wheezing in the chest, but no assoc nasal congestion.   no cbg record, but states cbg's have increased recently--sometimes as high as the 200's.  He seldom has hypoglycemia, and these episodes are mild.  The hypoglycemia happens in the early hrs of the morning.  He takes 40 units of insulin in the evening, but takes a widely varying dosage in the morning.  Past Medical History  Diagnosis Date  . DIABETES MELLITUS, TYPE I 04/03/2007  . 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 DIABETIC RETINOPATHY, PROLIFERATIVE 04/03/2007  . DM W/EYE MANIFESTATIONS, TYPE I, UNCONTROLLED 04/04/2007  . HYPERLIPIDEMIA 04/04/2007  . ANXIETY 04/03/2007  . DEPRESSION 04/03/2007  . CARPAL TUNNEL SYNDROME, BILATERAL 07/31/2007  . CORONARY ARTERY DISEASE 04/03/2007  . ASTHMATIC BRONCHITIS, ACUTE 10/25/2008  . ASTHMA 09/06/2008  . RENAL INSUFFICIENCY 07/31/2007  . CHEST PAIN 10/25/2008  . Renal insufficiency   . ED (erectile dysfunction)   . Cervical disc disease   . Bladder neck obstruction    Past Surgical History  Procedure Date  . Ptca     stent  . Spine surgery     s/p C-spine disc surgery-?fusion  . Appendectomy   . Tonsillectomy   . Stress cardiolite 09/06/2002    reports that he has quit smoking. He does not have any smokeless tobacco history on file. He reports that he does not drink alcohol or use illicit drugs. family history includes Diabetes in his brother; Heart disease in his brother; and Stroke in his father. Allergies  Allergen Reactions  . Iohexol      Code: HIVES, Desc: PATIENT STATES HE IS ALLERGIC TO IV DYE, HIVES, RASH. 12/28/05 /RM       Review of Systems Denies earache and fever.      Objective:   Physical Exam HEAD: head: no deformity eyes: no periorbital swelling, no proptosis external nose and ears are normal mouth: no lesion seen Chest:  Clear to  a, except for diffuse exp wheezes.    (i reviewed spirometry with pt, and i gave him a copy).      Assessment & Plan:  Asthma, with mild exacerbation. Dm, he needs some adjustment in his therapy.

## 2010-10-13 MED ORDER — INSULIN ASPART PROT & ASPART (70-30 MIX) 100 UNIT/ML ~~LOC~~ SUSP: 25.0000 [IU] | Freq: Two times a day (BID) | SUBCUTANEOUS | Status: DC

## 2010-10-27 LAB — GLUCOSE, CAPILLARY: Glucose-Capillary: 121 mg/dL — ABNORMAL HIGH (ref 70–99)

## 2010-11-06 ENCOUNTER — Other Ambulatory Visit: Payer: Self-pay | Admitting: Endocrinology

## 2010-11-06 MED ORDER — ALPRAZOLAM 2 MG PO TABS: 2.0000 mg | ORAL_TABLET | Freq: Every evening | ORAL | Status: DC | PRN

## 2010-11-06 NOTE — Telephone Encounter (Signed)
i printed 

## 2010-11-06 NOTE — Telephone Encounter (Signed)
Rx faxed to pharmacy  

## 2010-11-06 NOTE — Telephone Encounter (Signed)
Last Filled 05/08/2010 with 5 refills-please advise

## 2010-11-10 ENCOUNTER — Encounter: Payer: Self-pay | Admitting: Endocrinology

## 2010-11-10 ENCOUNTER — Ambulatory Visit (INDEPENDENT_AMBULATORY_CARE_PROVIDER_SITE_OTHER): Payer: 59 | Admitting: Endocrinology

## 2010-11-10 ENCOUNTER — Other Ambulatory Visit (INDEPENDENT_AMBULATORY_CARE_PROVIDER_SITE_OTHER): Payer: 59

## 2010-11-10 DIAGNOSIS — E119 Type 2 diabetes mellitus without complications: Secondary | ICD-10-CM

## 2010-11-10 DIAGNOSIS — E109 Type 1 diabetes mellitus without complications: Secondary | ICD-10-CM

## 2010-11-10 DIAGNOSIS — R1110 Vomiting, unspecified: Secondary | ICD-10-CM

## 2010-11-10 LAB — CBC WITH DIFFERENTIAL/PLATELET
Basophils Absolute: 0.1 10*3/uL (ref 0.0–0.1)
Hemoglobin: 15.8 g/dL (ref 13.0–17.0)
Lymphocytes Relative: 27.3 % (ref 12.0–46.0)
Monocytes Relative: 7.6 % (ref 3.0–12.0)
Neutrophils Relative %: 52.7 % (ref 43.0–77.0)
Platelets: 299 10*3/uL (ref 150.0–400.0)
RDW: 12.1 % (ref 11.5–14.6)

## 2010-11-10 LAB — HEPATIC FUNCTION PANEL
AST: 25 U/L (ref 0–37)
Albumin: 3.9 g/dL (ref 3.5–5.2)
Alkaline Phosphatase: 104 U/L (ref 39–117)
Total Protein: 6.5 g/dL (ref 6.0–8.3)

## 2010-11-10 LAB — AMYLASE: Amylase: 37 U/L (ref 27–131)

## 2010-11-10 MED ORDER — ONDANSETRON HCL 4 MG PO TABS: 4.0000 mg | ORAL_TABLET | Freq: Three times a day (TID) | ORAL | Status: DC | PRN

## 2010-11-10 NOTE — Progress Notes (Signed)
  Subjective:    Patient ID: James [name], male    DOB: Aug 04, 1955, 55 y.o.   MRN: [id number]  HPI Pt states few days of intermittent n/v, and slight pain generalized throughout the abdomen.  He sometimes has assoc diarrhea.  He says this episode lasted just a few days, but he gets it off and on.   no cbg record, but states cbg's are slightly high. Past Medical History  Diagnosis Date  . DIABETES MELLITUS, TYPE I 04/03/2007  . 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 DIABETIC RETINOPATHY, PROLIFERATIVE 04/03/2007  . DM W/EYE MANIFESTATIONS, TYPE I, UNCONTROLLED 04/04/2007  . HYPERLIPIDEMIA 04/04/2007  . ANXIETY 04/03/2007  . DEPRESSION 04/03/2007  . CARPAL TUNNEL SYNDROME, BILATERAL 07/31/2007  . CORONARY ARTERY DISEASE 04/03/2007  . ASTHMATIC BRONCHITIS, ACUTE 10/25/2008  . ASTHMA 09/06/2008  . RENAL INSUFFICIENCY 07/31/2007  . CHEST PAIN 10/25/2008  . Renal insufficiency   . ED (erectile dysfunction)   . Cervical disc disease   . Bladder neck obstruction    Past Surgical History  Procedure Date  . Ptca     stent  . Spine surgery     s/p C-spine disc surgery-?fusion  . Appendectomy   . Tonsillectomy   . Stress cardiolite 09/06/2002    reports that he has quit smoking. He does not have any smokeless tobacco history on file. He reports that he does not drink alcohol or use illicit drugs. family history includes Diabetes in his brother; Heart disease in his brother; and Stroke in his father. Allergies  Allergen Reactions  . Iohexol      Code: HIVES, Desc: PATIENT STATES HE IS ALLERGIC TO IV DYE, HIVES, RASH. 12/28/05 /RM     Review of Systems denies hypoglycemia and brbpr.    Objective:   Physical Exam ABDOMEN: abdomen is soft, nontender.  no hepatosplenomegaly.   not distended.  no hernia    Lab Results  Component Value Date   WBC 6.0 11/10/2010   HGB 15.8 11/10/2010   HCT 45.0 11/10/2010   PLT 299.0 11/10/2010   CHOL 124 05/08/2010   TRIG 38.0 05/08/2010   HDL 54.20  05/08/2010   ALT 36 11/10/2010   AST 25 11/10/2010   NA 139 05/08/2010   K 4.3 05/08/2010   CL 101 05/08/2010   CREATININE 0.8 05/08/2010   BUN 12 05/08/2010   CO2 32 05/08/2010   TSH 3.18 05/08/2010   PSA 0.56 05/08/2010   HGBA1C 9.0* 11/10/2010   MICROALBUR 2.5* 05/08/2010      Assessment & Plan:  abd pain, uncertain etiology.  New problem Dm, needs increased rx

## 2010-11-10 NOTE — Patient Instructions (Addendum)
blood tests are being ordered for you today.  please call [phone] to hear your test results. Refer to gastroenterology.  you will be called with a Patton and time for an appointment Let's also check an ultrasound of the gallbladder.  you will be called with a Blowers and time for an appointment. Please make a regular physical appointment in 3 months. check your blood sugar 4 times a Schmieg--before the 3 meals, and at bedtime.  also check if you have symptoms of your blood sugar being too high or too low.  please keep a record of the readings and bring it to your next appointment here.  please call us sooner if you are having low blood sugar episodes. good diet and exercise habits significanly improve the control of your diabetes.  please let me know if you wish to be referred to a dietician.  high blood sugar is very risky to your health.  you should see an eye doctor every year. controlling your blood pressure and cholesterol drastically reduces the damage diabetes does to your body.  this also applies to quitting smoking.  please discuss these with your doctor.  you should take an aspirin every Mom, unless you have been advised by a doctor not to. Here are some samples of "nexium," to take daily. i have also sent a prescription for anti-nausea medication to your pharmacy.   (update: i left message on phone-tree:  Increase am insulin to 30, if you are not having hypoglycemia)

## 2010-11-17 ENCOUNTER — Ambulatory Visit
Admission: RE | Admit: 2010-11-17 | Discharge: 2010-11-17 | Disposition: A | Discharge status: Home / Self Care | Payer: 59 | Source: Ambulatory Visit | Attending: Endocrinology | Admitting: Endocrinology

## 2010-11-17 DIAGNOSIS — R1110 Vomiting, unspecified: Secondary | ICD-10-CM

## 2010-11-29 ENCOUNTER — Emergency Department (HOSPITAL_COMMUNITY)
Admission: EM | Admit: 2010-11-29 | Discharge: 2010-11-29 | Disposition: A | Discharge status: Home / Self Care | Payer: 59 | Attending: Emergency Medicine | Admitting: Emergency Medicine

## 2010-11-29 DIAGNOSIS — Z794 Long term (current) use of insulin: Secondary | ICD-10-CM | POA: Insufficient documentation

## 2010-11-29 DIAGNOSIS — K089 Disorder of teeth and supporting structures, unspecified: Secondary | ICD-10-CM | POA: Insufficient documentation

## 2010-11-29 DIAGNOSIS — E7800 Pure hypercholesterolemia, unspecified: Secondary | ICD-10-CM | POA: Insufficient documentation

## 2010-11-29 DIAGNOSIS — E119 Type 2 diabetes mellitus without complications: Secondary | ICD-10-CM | POA: Insufficient documentation

## 2010-11-29 DIAGNOSIS — Z79899 Other long term (current) drug therapy: Secondary | ICD-10-CM | POA: Insufficient documentation

## 2010-11-29 LAB — GLUCOSE, CAPILLARY: Glucose-Capillary: 80 mg/dL (ref 70–99)

## 2010-12-01 NOTE — Op Note (Signed)
James [name], James [name]                   ACCOUNT NO.:  [id number]   MEDICAL RECORD NO.:  [id number]          PATIENT TYPE:  OIB   LOCATION:  5731                         FACILITY:  [facility]   PHYSICIAN:  Beulah Gandy. Ashley Royalty, M.D. DATE OF BIRTH:  [birth year]   DATE OF PROCEDURE:  04/11/2007  DATE OF DISCHARGE:                               OPERATIVE REPORT   ADMISSION DIAGNOSIS:  Vitreous hemorrhage, proliferative diabetic  retinopathy, posterior retinal membranes, left eye.   PROCEDURE:  Pars plana vitrectomy with 25 gauge system, retinal  photocoagulation, membrane peel of cellular membrane, left eye.   SURGEON:  Beulah Gandy. Ashley Royalty, M.D.   ASSISTANT:  Rosalie Doctor, MA   ANESTHESIA:  General.   DETAILS:  Usual prep and drape, 25 gauge trocars placed at 10, 2, and 4  o'clock.  Infusion at 4 o'clock.  Provisc placed on the corneal surface.  The Biome viewing system moved into place.  The lighted pick and the  cutter were placed at 10 and 2 o'clock respectively.  Pars plana  vitrectomy was begun just behind the crystalline lens.  Dark red blood  was encountered in the vitreous cavity.  The blood and membranes were  adherent to the macular surface.  The lighted pick was used to engage  the membranes and peel them from their attachment to the macular  surface.  The vitrectomy is carried into the peripheral vitreous area  where clots of dark red and light red blood were encountered.  Scleral  depression was used to identify these areas of bleeding and remove them  from the eye.  The endolaser was positioned in the eye and 342 burns  placed around the retinal periphery and around bleeding points.  The  power was 1000 milliwatts, 1000 microns each, and 0.1 seconds each.  A  washout procedure was performed.  Once all blood was removed, the  instruments were removed from the eye and the trocars were removed.  The  wounds were tested and found to be tight. Polymyxin and gentamicin were  irrigated into  tenon's space.  Atropine solution was applied, Decadron  10 mg was injected into the lower subconjunctival space.  Marcaine was  injected around the globe for postop pain.  TobraDex ophthalmic  ointment, a patch and shield were placed.  Closing pressure was 10 with  a Banker.  Complications none.  Duration 30 minutes.  The  patient is awakened and taken to recovery in satisfactory condition.      Beulah Gandy. Ashley Royalty, M.D.  Electronically Signed     JDM/MEDQ  D:  04/11/2007  T:  04/11/2007  Job:  [id number]

## 2010-12-01 NOTE — Op Note (Signed)
James [name], James [name]                   ACCOUNT NO.:  [id number]   MEDICAL RECORD NO.:  [id number]          PATIENT TYPE:  AMB   LOCATION:  SDS                          FACILITY:  [facility]   PHYSICIAN:  John D. Ashley Royalty, M.D. DATE OF BIRTH:  1956-01-04   DATE OF PROCEDURE:  04/11/2008  DATE OF DISCHARGE:                               OPERATIVE REPORT   ADMISSION DIAGNOSIS:  Proliferative diabetic retinopathy, vitreous  hemorrhage, posterior vitreoretinal membranes, right eye.   PROCEDURES:  Pars plana vitrectomy with membrane peel, retinal  photocoagulation, and gas injection, all right eye.   SURGEON:  Beulah Gandy. Ashley Royalty, MD   ASSISTANT:  Bryan Lemma. Lundquist, PA   ANESTHESIA:  General.   PROCEDURE IN DETAIL:  After usual prep and drape, 25 gauge trocars at 8,  10, and 2 o'clock, infusion at 8 o'clock, the lighted pick and the  cutter were placed at 10 and 2 o'clock respectively.  Provisc was placed  on the corneal surface and the BIOM viewing system was moved into place.  Pars plana vitrectomy was begun just behind the crystalline lens.  Blood  and vitreous was encountered.  This was carefully removed under low  suction and rapid cutting.  Posterior vitreous membranes were attached  to the disk and macula.  These were lifted free with the lighted pick  and removed with the vitreous cutter.  The vitrectomy was carried out  down to the equator where the surface proliferation was removed.  The  vitrectomy was carried down to the vitreous base where the vitreous base  was trimmed per 360 degrees.  Scleral depression was used to gain access  to the extreme vitreous areas.  All blood was removed.  The endolaser  was positioned in the eye, 500 burns were placed around the retinal  periphery with a power of 2000 milliwatts, 1000 microns each and 0.1  seconds each, and 30% gas fluid exchange was performed.  The instruments  were removed from the eye and the trocars were removed from the eye.  Polymyxin and gentamicin were irrigated into [place] space.  Atropine  solution was applied.  Marcaine was injected around the globe for postop  pain.  Decadron 10 mg was injected into the lower subconjunctival space.  The closing pressure was 10 with a Barraquer tonometer.  Polysporin  ophthalmic ointment, patch and shield were placed.  The patient was  awakened and taken to recovery in satisfactory condition.   OPERATIVE TIME:  30 minutes.      Beulah Gandy. Ashley Royalty, M.D.  Electronically Signed     JDM/MEDQ  D:  04/11/2008  T:  04/12/2008  Job:  [id number]

## 2010-12-04 NOTE — Op Note (Signed)
[place]  Patient:    James [name], James [name]                          MRN: [id number] Proc. Date: 06/10/00 Adm. Date:  [id number] Attending:  Jacinta Shoe V                           Operative Report  PREOPERATIVE DIAGNOSIS:  Herniated disk C6-7 left.  POSTOPERATIVE DIAGNOSIS:  Herniated disk C6-7 left.  OPERATION PROCEDURE:  C6-7 anterior cervical diskectomy and fusion.  SURGEON:  Payton Doughty, M.D.  ANESTHESIA:  General endotracheal.  PREP:  Shave and prepped, scrubbed with alcohol wipe.  COMPLICATIONS:  None.  DESCRIPTION OF PROCEDURE:  A 55 year old right-handed, gentleman with left C7 radiculopathy secondary to disk at C6-C7.  He was taken to the operating room, smoothly anesthetized, intubated and placed supine position on the operating table.  Following shave, prepped and draped in usual sterile fashion.  Skin was incised in the midline in the medial border of sternocleidomastoid muscle on the left side.  Platysma was identified, elevated, divided and undermined. The sternocleidomastoid was identified, medial dissection revealed the carotid artery and jugular vein retracted laterally to the left.  Trachea and esophagus retracted laterally to the right exposing the bones of the anterior cervical spine.  Intraoperative x-ray was taken with a marker in place to ensure correctness level.  Diskectomy was then carried out at C6-C7 first under gross observation and then using the operating microscope.  On the left side, there was found to be extensive disk material into the left neural foramen with significant compression of left C7 root.  This was removed without difficulty and the nerve carefully explored in all quadrants to ensure that there were no remaining fragments and the nerve was free.  The right side was similarly explored and found to be unencumbered.  Following complete diskectomy, a 7 mm bone graft was fashioned from patellar  allograft and tapped into place.  An 18 mm tether plate was then placed with 13 mm x 4 mm variable angled screws.  Intraoperative x-ray showed good placement of bone grafts, plate and screws.  The wound was irrigated and hemostasis assured.  The platysma was reapproximated with 3-0 Vicryl interrupted fashion.  Subcutaneous tissue reapproximated with 3-0 Vicryl interrupted fashion.  The skin was closed with 4-0 Vicryl in a running subcuticular fashion.  Benzoin and Steri-Strips were placed and made occlusive with Telfa and OpSite.  The patient then placed in a soft collar and returned recovery room in good condition. DD:  06/10/00 TD:  06/10/00 Job: [id number]

## 2010-12-04 NOTE — Consult Note (Signed)
[place]  Patient:    James [name], James [name]                          MRN: [id number] Proc. Date: 06/10/00 Adm. Date:  [id number] Attending:  Tresa Garter                          Consultation Report  REFERRING PHYSICIAN:  Sonda Primes, M.D.  HISTORY OF PRESENT ILLNESS:  I was called to see this 55 year old, right-handed, white gentleman who was has been hospitalized since yesterday for control of left shoulder pain. He has been having pain for about 2 to 3 weeks in his arm and then over the past 4 or 5 days it has increased intensity. He had had a steroid injection to no avail. He has been on Ultram, Talwin, Vicodin, and Celebrex with no relief, and he is currently in the hospital on a PCA which he is maxing.  PAST MEDICAL HISTORY:  Remarkable for insulin-dependent diabetes. He is on insulin and Ultra Lente 10 units twice a Tegethoff, Humalog 10 units twice a Bhalla, and sliding scale. He also has rheumatoid arthritis.  PAST SURGICAL HISTORY:  Appendectomy and a lymph node in the past.  PHYSICAL EXAMINATION:  HEENT: Within normal limits.  NEUROLOGICAL:  Awake, alert, and oriented. His cranial nerves are intact, except for some mild retinopathy which limits his visual acuity. He has 5/5 strength throughout the upper and lower extremities, except for the left triceps which is 4/5. He has a left C7 sensory deficit. Reflexes are 2 at the biceps bilaterally, 1 at the right triceps, absent at the left triceps, 1 at the brachioradialis. Lower extremities are nonmyopathic and toes downgoing bilaterally. Mikey Bussing is negative.  NECK:  He has limited range of motion in his neck.  CHEST:  Clear.  CARDIAC:  Regular rate and rhythm.  LABORATORY DATA:  MRI shows a herniated disk at C6-C7 eccentric to the left side.  CLINICAL IMPRESSION:  Herniated disk C6-C7 with a severe left C7 radiculopathy and ______ pain. I discussed the risks and benefits of operative  intervention with the patient. He is thinking it over.DD:  06/10/00 TD:  06/10/00 Job: [id number]

## 2010-12-04 NOTE — H&P (Signed)
[place]  Patient:    James [name], James [name]                          MRN: [id number] Adm. Date:  [id number] Disc. Date: [id number] Attending:  Howell Pringle CC:         Justine Null, M.D. [facility]  Lunette Stands, M.D.  Aundra Dubin, M.D.   History and Physical  DATE OF BIRTH:  01/17/1956  CHIEF COMPLAINT:  Severe pain in the left shoulder, left shoulder blade, left arm.  HISTORY OF PRESENT ILLNESS:  The patient is an 55 year old white male who presented to the ER with intractable pain in the described above area.  The patient in the left shoulder started about 2-3 weeks ago and was localized mostly in the shoulder blade and then propels down to arm and hand.  Over the past 4-5 days it has been 10/10 in intensity.  He saw Dr. Charlett Blake on November 19, and received a steroid injection.  He tried Ultram, Talwin, Vicodin, and Celebrex in the past several days with no relief.  He has not been sleeping for three nights.  Today he woke up with pain spreading up to his neck and numbness in his hands and the radial side of his forearm.  CURRENT MEDICATIONS:  Pain medicines, as described above. 1. Insulin and Ultralente 10 units twice a Bello. 2. Humalog 10 units twice a Mickiewicz plus sliding scale.  PAST MEDICAL HISTORY: 1. 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 2. Rheumatoid arthritis five years ago.  FAMILY HISTORY:  Father had CVA.  Brother has diabetes type 1 and rheumatoid arthritis.  SOCIAL HISTORY:  He is married with one son and two stepchildren.  Smokes one pack a Zentner, does not drink alcohol.  He is employed in Airline [employer].  REVIEW OF SYSTEMS:  As above.  The rest is negative.  PHYSICAL EXAMINATION:  VITAL SIGNS:  Blood pressure 154/96, temperature 97.9, O2 saturations 100% on room air.  The patient is in mild distress due to pain.  Respiratory rate 12-16.  HEENT:  With moist mucosa, conjunctivae injected with blood  vessels.  The face is ______ flushed.  NECK:  Supple with full range of motion.  LEFT SHOULDER:  With full range of motion, tender at the extremes.  No muscle atrophy.  Skin without lesions.  Muscles ______ appears to be normal except for the left triceps which has 4/5.  Grip is good bilaterally.  EXTREMITIES:  Deep tendon reflexes within normal limits.  LUNGS:  Bilateral rhonchi.  HEART:  S1, S2, slight tachycardia.  ABDOMEN:  Soft, nontender, no organomegaly, no masses felt.  LOWER EXTREMITIES:  Without edema.  NEUROLOGIC;  He is alert, oriented and is cooperative.  LABORATORY:  Chest x-ray unremarkable.  ASSESSMENT AND PLAN: 1. Intractable left shoulder pain, left arm pain, accompanied by triceps    weakness and forearm paresthesia.  Rule out radiculopathy.  Obtain MRI of    the C-spine, start IV steroids, MS, PCA pump, Vioxx 50 mg today.    Aggressive bowel program. 2. Diabetes mellitus type 1 with retinopathy and nephropathy.  We will use    current regimen of insulin and a sliding scale. 3. Elevated blood pressure with diabetic nephropathy.  Start low-dose Accupril    5 mg a Arrellano. DD:  06/09/00 TD:  06/09/00 Job: [id number]

## 2010-12-04 NOTE — Cardiovascular Report (Signed)
NAME:  JEFFEREY, LIPPMANN                             ACCOUNT NO.:  [id number]   MEDICAL RECORD NO.:  [id number]                   PATIENT TYPE:  OIB   LOCATION:  2856                                 FACILITY:  [facility]   PHYSICIAN:  Charlies Constable, M.D. [facility]              DATE OF BIRTH:  1956-04-11   DATE OF PROCEDURE:  09/28/2002  DATE OF DISCHARGE:                              CARDIAC CATHETERIZATION   PROCEDURE PERFORMED:  Cardiac catheterization and percutaneous coronary  intervention.   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  His  wife is a Engineer, civil (consulting) at [employer].  He has had no past history of known  heart disease but is diabetic and smokes and has positive family history for  coronary heart disease.  He recently had a episode of cough, severe  bronchitis and had shortness of breath with exertion.  Because of his  shortness of breath, he also had a Cardiolite scan which suggested possible  mild inferobasal ischemia.  He was seen in consultation by Guy Franco and  Glennon Hamilton and scheduled for evaluation with angiography.   DESCRIPTION OF PROCEDURE:  The procedure was performed via the right femoral  artery using an arterial sheath and 6 French preformed coronary catheters.  A front wall arterial puncture was performed and Omnipaque contrast was  used.  A descending aortogram was performed to without abdominal aortic  aneurysm.  The right femoral artery angiogram was performed to assess  whether he was suitable for the Matrix closure device.  The patient  tolerated the procedure well and left the laboratory in satisfactory  condition.   RESULTS:  1. The left main coronary artery:  The left main coronary artery was free of     significant disease.  2. Left anterior descending:  The left anterior descending artery gave rise     to two septal perforators and two diagonal branches. There was some     irregularity in the LAD but no significant  obstruction.  3. Circumflex artery:  The circumflex artery was a large dominant vessel     that gave rise to two marginal branches, two posterolateral branches, and     a posterior descending branch.  There was some irregularity in the AV     portion of the circumflex artery but no significant obstruction.  4. Right coronary artery:  The right coronary was a small nondominant vessel     that gave rise to two right ventricular branches.   LEFT VENTRICULOGRAM:  The left ventriculogram performed in the RAO  projection showed good wall motion with no areas of hypokinesis.  The  estimated ejection fraction was 60%.   DISTAL AORTOGRAPHY:  A distal aortogram was performed which showed patent  renal arteries and no significant aortoiliac obstruction.   The aortic pressure was 117/76 with a mean of 94 and  left ventricular  pressure was 117/13.   CONCLUSIONS:  No significant obstructive coronary disease with minimal  irregularities in the LAD and circumflex arteries and normal left  ventricular function.   RECOMMENDATIONS:  Reassurance.                                                    Charlies Constable, M.D. [facility]    BB/MEDQ  D:  09/28/2002  T:  09/29/2002  Job:  [id number]   cc:   Gregary Signs A. Everardo All, M.D. [facility]   Bea Laura Graceann Congress, M.D. [facility]   Cardiopulmonary Lab

## 2010-12-18 ENCOUNTER — Telehealth: Payer: Self-pay

## 2010-12-18 MED ORDER — ESOMEPRAZOLE MAGNESIUM 40 MG PO CPDR: 40.0000 mg | DELAYED_RELEASE_CAPSULE | Freq: Every day | ORAL | Status: DC

## 2010-12-18 NOTE — Telephone Encounter (Signed)
Pt called requesting prescription of Nexium given as sample at last OV, okay to fill?

## 2010-12-18 NOTE — Telephone Encounter (Signed)
i sent rx 

## 2010-12-21 ENCOUNTER — Ambulatory Visit (INDEPENDENT_AMBULATORY_CARE_PROVIDER_SITE_OTHER): Payer: 59 | Admitting: Gastroenterology

## 2010-12-21 ENCOUNTER — Encounter: Payer: Self-pay | Admitting: Gastroenterology

## 2010-12-21 DIAGNOSIS — K219 Gastro-esophageal reflux disease without esophagitis: Secondary | ICD-10-CM | POA: Insufficient documentation

## 2010-12-21 DIAGNOSIS — R1110 Vomiting, unspecified: Secondary | ICD-10-CM

## 2010-12-21 DIAGNOSIS — R197 Diarrhea, unspecified: Secondary | ICD-10-CM

## 2010-12-21 MED ORDER — PEG-KCL-NACL-NASULF-NA ASC-C 100 G PO SOLR: 1.0000 | Freq: Once | ORAL | Status: AC

## 2010-12-21 NOTE — Progress Notes (Signed)
History of Present Illness:  [name] is a pleasant 56 year old white male with type 1 diabetes referred at the request of Dr. Everardo All for evaluation of change in bowel habits. For several months he's noted new onset of diarrhea consisting of loose, poorly formed stools every 2-3 days. He also has noticed excess flatus. Several weeks ago he had somewhat protracted nausea with vomiting. This has subsided. He is without abdominal pain but has noted abdominal distention. He was having regurgitation of gastric contents and pyrosis that resolved after starting Nexium.  He denies melena or hematochezia. There has been no other changes in his medications.    Review of Systems: He has gained 20 pounds in the past year since stopping smoking. Pertinent positive and negative review of systems were noted in the above HPI section. All other review of systems were otherwise negative.    Current Medications, Allergies, Past Medical History, Past Surgical History, Family History and Social History were reviewed in [employer] electronic medical record  Vital signs were reviewed in today's medical record. Physical Exam: General: Well developed , well nourished, no acute distress Head: Normocephalic and atraumatic Eyes:  sclerae anicteric, EOMI Ears: Normal auditory acuity Mouth: No deformity or lesions Lungs: Clear throughout to auscultation Heart: Regular rate and rhythm; no murmurs, rubs or bruits Abdomen: Soft, non tender and non distended. No masses, hepatosplenomegaly or hernias noted. Normal Bowel sounds; there is no succussion splash Rectal:deferred Musculoskeletal: Symmetrical with no gross deformities  Pulses:  Normal pulses noted Extremities: No clubbing, cyanosis, edema or deformities noted Neurological: Alert oriented x 4, grossly nonfocal Psychological:  Alert and cooperative. Normal mood and affect

## 2010-12-21 NOTE — Assessment & Plan Note (Addendum)
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 A structural abnormality of the colon should be ruled out.  Recommendations #1 gastric empty scan #2 colonoscopy

## 2010-12-21 NOTE — Assessment & Plan Note (Signed)
Although this has resolved I am suspicious that he may have gastroparesis.  Recommendations #1 gastric emptying scan

## 2010-12-21 NOTE — Assessment & Plan Note (Signed)
Plan to continue Nexium

## 2010-12-21 NOTE — Patient Instructions (Addendum)
Colonoscopy A colonoscopy is an exam to evaluate your entire colon. In this exam, your colon is cleansed. A long fiberoptic tube is inserted through your rectum and into your colon. The fiberoptic scope (endoscope) is a long bundle of enclosed and very flexible fibers. These fibers transmit light to the area examined and send images from that area to your caregiver. Discomfort is usually minimal. You may be given a drug to help you sleep (sedative) during or prior to the procedure. This exam helps to detect lumps (tumors), polyps, inflammation, and areas of bleeding. Your caregiver may also take a small piece of tissue (biopsy) that will be examined under a microscope. BEFORE THE PROCEDURE  A clear liquid diet may be required for 2 days before the exam.   Liquid injections (enemas) or laxatives may be required.   A large amount of electrolyte solution may be given to you to drink over a short period of time. This solution is used to clean out your colon.   You should be present 100 prior to your procedure or as directed by your caregiver.   Check in at the admissions desk to fill out necessary forms if not preregistered. There will be consent forms to sign prior to the procedure. If accompanied by friends or family, there is a waiting area for them while you are having your procedure.  LET YOUR CAREGIVER KNOW ABOUT:  Allergies to food or medicine.  Medicines taken, including vitamins, herbs, eyedrops, over-the-counter medicines, and creams.   Use of steroids (by mouth or creams).   Previous problems with anesthetics or numbing medicines.   History of bleeding problems or blood clots.  Previous surgery.   Other health problems, including diabetes and kidney problems.   Possibility of pregnancy, if this applies.   AFTER THE PROCEDURE  If you received a sedative and/or pain medicine, you will need to arrange for someone to drive you home.   Occasionally, there is a little blood passed  with the first bowel movement. DO NOT be concerned.  HOME CARE INSTRUCTIONS  It is not unusual to pass moderate amounts of gas and experience mild abdominal cramping following the procedure. This is due to air being used to inflate your colon during the exam. Walking or a warm pack on your belly (abdomen) may help.   You may resume all normal meals and activities after sedatives and medicines have worn off.   Only take over-the-counter or prescription medicines for pain, discomfort, or fever as directed by your caregiver. DO NOT use aspirin or blood thinners if a biopsy was taken. Consult your caregiver for medicine usage if biopsies were taken.  FINDING OUT THE RESULTS OF YOUR TEST Not all test results are available during your visit. If your test results are not back during the visit, make an appointment with your caregiver to find out the results. Do not assume everything is normal if you have not heard from your caregiver or the medical facility. It is important for you to follow up on all of your test results. SEEK IMMEDIATE MEDICAL CARE IF:  You have an oral temperature above 100, not controlled by medicine.   You pass large blood clots or fill a toilet with blood following the procedure. This may also occur 10 to 14 days following the procedure. This is more likely if a biopsy was taken.   You develop abdominal pain that keeps getting worse and cannot be relieved with medicine.  Document Released: 07/02/2000 Document Re-Released: 09/29/2009  [employer] Patient Information 2011 [place], [place]. Your Colonoscopy is scheduled on 12/25/2010 at 4pm We will send in your MoviPrep to your pharmacy today Your Gastric Emptying Scan is scheduled at [facility] on 01/07/2011 at 8am No stomach medications 24 hours prior to your test

## 2010-12-23 ENCOUNTER — Telehealth: Payer: Self-pay | Admitting: Gastroenterology

## 2010-12-23 NOTE — Telephone Encounter (Signed)
Dr Arlyce Dice, pt is having dental procedure tomorrow wants to make sure he is ok for colon on friday

## 2010-12-24 NOTE — Telephone Encounter (Signed)
yes

## 2010-12-24 NOTE — Telephone Encounter (Signed)
RETURNED PTS CALL, OK PER DR KAPLAN TO HAVE COLONOSCOPY ON FRIDAY

## 2010-12-25 ENCOUNTER — Encounter: Payer: Self-pay | Admitting: Gastroenterology

## 2010-12-25 ENCOUNTER — Ambulatory Visit (AMBULATORY_SURGERY_CENTER): Payer: 59 | Admitting: Gastroenterology

## 2010-12-25 VITALS — BP 129/78 | HR 76 | Temp 97.2°F | Resp 20 | Ht 69.0 in | Wt 180.0 lb

## 2010-12-25 DIAGNOSIS — R198 Other specified symptoms and signs involving the digestive system and abdomen: Secondary | ICD-10-CM

## 2010-12-25 DIAGNOSIS — R197 Diarrhea, unspecified: Secondary | ICD-10-CM

## 2010-12-25 DIAGNOSIS — Z1211 Encounter for screening for malignant neoplasm of colon: Secondary | ICD-10-CM

## 2010-12-25 LAB — GLUCOSE, CAPILLARY: Glucose-Capillary: 224 mg/dL — ABNORMAL HIGH (ref 70–99)

## 2010-12-25 MED ORDER — CIPROFLOXACIN HCL 500 MG PO TABS: 500.0000 mg | ORAL_TABLET | Freq: Two times a day (BID) | ORAL | Status: AC

## 2010-12-25 MED ORDER — SODIUM CHLORIDE 0.9 % IV SOLN: 500.0000 mL | INTRAVENOUS | Status: DC

## 2010-12-25 NOTE — Patient Instructions (Signed)
Please refer to blue and green discharge instruction sheets. 

## 2010-12-28 ENCOUNTER — Telehealth: Payer: Self-pay

## 2010-12-28 NOTE — Telephone Encounter (Signed)

## 2011-01-06 ENCOUNTER — Other Ambulatory Visit: Payer: Self-pay | Admitting: Endocrinology

## 2011-01-06 MED ORDER — ONETOUCH ULTRA 2 W/DEVICE KIT: 1.0000 | PACK | Freq: Once | Status: DC

## 2011-01-07 ENCOUNTER — Telehealth: Payer: Self-pay | Admitting: Gastroenterology

## 2011-01-07 NOTE — Telephone Encounter (Signed)
Instructions for GES reviewed with pt, he is to be NPO after midnight and to hold stomach medications 24 hours prior to the scan.

## 2011-01-08 ENCOUNTER — Encounter (HOSPITAL_COMMUNITY): Payer: Self-pay

## 2011-01-08 ENCOUNTER — Telehealth: Payer: Self-pay | Admitting: Gastroenterology

## 2011-01-08 ENCOUNTER — Encounter (HOSPITAL_COMMUNITY)
Admission: RE | Admit: 2011-01-08 | Discharge: 2011-01-08 | Disposition: A | Discharge status: Home / Self Care | Payer: 59 | Source: Ambulatory Visit | Attending: Gastroenterology | Admitting: Gastroenterology

## 2011-01-08 DIAGNOSIS — R142 Eructation: Secondary | ICD-10-CM | POA: Insufficient documentation

## 2011-01-08 DIAGNOSIS — K3189 Other diseases of stomach and duodenum: Secondary | ICD-10-CM | POA: Insufficient documentation

## 2011-01-08 DIAGNOSIS — R1110 Vomiting, unspecified: Secondary | ICD-10-CM

## 2011-01-08 DIAGNOSIS — R141 Gas pain: Secondary | ICD-10-CM | POA: Insufficient documentation

## 2011-01-08 DIAGNOSIS — K219 Gastro-esophageal reflux disease without esophagitis: Secondary | ICD-10-CM

## 2011-01-08 DIAGNOSIS — R109 Unspecified abdominal pain: Secondary | ICD-10-CM | POA: Insufficient documentation

## 2011-01-08 DIAGNOSIS — R197 Diarrhea, unspecified: Secondary | ICD-10-CM

## 2011-01-08 MED ORDER — TECHNETIUM TC 99M SULFUR COLLOID
2.1000 | Freq: Once | INTRAVENOUS | Status: AC | PRN
  Administered 2011-01-08: 2.1 via ORAL

## 2011-01-08 NOTE — Telephone Encounter (Signed)
Noted  

## 2011-01-11 NOTE — Progress Notes (Signed)
Quick Note:  Needs to begin reglan 10mg  1/2 hr ac and hs Instruct pt to contact me immediately if he develops any side effects from her Reglan including paresthesias, tremors, confusion , weakness or muscle spasms. Inform pt of results  ______

## 2011-01-12 ENCOUNTER — Telehealth: Payer: Self-pay

## 2011-01-12 NOTE — Telephone Encounter (Signed)
Message copied by Michele Mcalpine on Tue Jan 12, 2011  8:51 AM ------      Message from: Melvia Heaps MD D      Created: Mon Jan 11, 2011  4:07 PM       Needs to begin reglan 10mg  1/2 hr ac and hs      Instruct pt to contact me immediately  if he develops any side effects from her Reglan including paresthesias, tremors, confusion , weakness or muscle spasms.      Inform pt of results

## 2011-01-13 NOTE — Telephone Encounter (Signed)
Left message for pt to call back  °

## 2011-01-14 NOTE — Telephone Encounter (Signed)
Left message for pt to call back  °

## 2011-01-15 ENCOUNTER — Ambulatory Visit: Payer: 59 | Admitting: Gastroenterology

## 2011-01-18 MED ORDER — METOCLOPRAMIDE HCL 10 MG PO TABS: 10.0000 mg | ORAL_TABLET | Freq: Four times a day (QID) | ORAL | Status: DC

## 2011-01-18 NOTE — Telephone Encounter (Signed)
Spoke with pt and he is aware of results per Dr. Arlyce Dice regarding GES, Rx sent to pharmacy.

## 2011-02-02 ENCOUNTER — Telehealth: Payer: Self-pay | Admitting: *Deleted

## 2011-02-02 NOTE — Telephone Encounter (Signed)
Per MD, pt is due for OV. Left message for pt to callback office 

## 2011-02-02 NOTE — Telephone Encounter (Signed)
Appointment schedule 02/05/2011 9:45am

## 2011-02-05 ENCOUNTER — Encounter: Payer: Self-pay | Admitting: Endocrinology

## 2011-02-05 ENCOUNTER — Ambulatory Visit (INDEPENDENT_AMBULATORY_CARE_PROVIDER_SITE_OTHER): Payer: 59 | Admitting: Endocrinology

## 2011-02-05 DIAGNOSIS — E109 Type 1 diabetes mellitus without complications: Secondary | ICD-10-CM

## 2011-02-05 MED ORDER — INSULIN LISPRO 100 UNIT/ML ~~LOC~~ SOLN: 8.0000 [IU] | Freq: Three times a day (TID) | SUBCUTANEOUS | Status: DC

## 2011-02-05 MED ORDER — INSULIN GLARGINE 100 UNIT/ML ~~LOC~~ SOLN: 20.0000 [IU] | Freq: Every day | SUBCUTANEOUS | Status: DC

## 2011-02-05 NOTE — Progress Notes (Signed)
Subjective:    Patient ID: James [name], male    DOB: 04/10/1956, 55 y.o.   MRN: [id number]  HPI Pt says cbg's continue to fluctuate.  He wants to go back to the qid insulin injections again.  no cbg record. He has hypoglycemia approx 2/week. Past Medical History  Diagnosis Date  . DIABETES MELLITUS, TYPE I 04/03/2007  . 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 DIABETIC RETINOPATHY, PROLIFERATIVE 04/03/2007  . DM W/EYE MANIFESTATIONS, TYPE I, UNCONTROLLED 04/04/2007  . HYPERLIPIDEMIA 04/04/2007  . ANXIETY 04/03/2007  . DEPRESSION 04/03/2007  . CARPAL TUNNEL SYNDROME, BILATERAL 07/31/2007  . CORONARY ARTERY DISEASE 04/03/2007  . ASTHMATIC BRONCHITIS, ACUTE 10/25/2008  . ASTHMA 09/06/2008  . RENAL INSUFFICIENCY 07/31/2007  . CHEST PAIN 10/25/2008  . Renal insufficiency   . ED (erectile dysfunction)   . Cervical disc disease   . Bladder neck obstruction     Past Surgical History  Procedure Date  . Ptca     stent  . Spine surgery     s/p C-spine disc surgery-?fusion  . Appendectomy   . Tonsillectomy   . Stress cardiolite 09/06/2002    History   Social History  . Marital Status: Married    Spouse Name: N/A    Number of Children: 2  . Years of Education: N/A   Occupational History  .      owns a Aeronautical engineer business   Social History Main Topics  . Smoking status: Former Games developer  . Smokeless tobacco: Not on file  . Alcohol Use: No  . Drug Use: No  . Sexually Active: Not on file   Other Topics Concern  . Not on file   Social History Narrative   Insurance coverage for Chantix    Current Outpatient Prescriptions on File Prior to Visit  Medication Sig Dispense Refill  . albuterol (PROVENTIL HFA;VENTOLIN HFA) 108 (90 BASE) MCG/ACT inhaler Inhale 2 puffs into the lungs every 4 (four) hours as needed for wheezing.  1 Inhaler  11  . Alcohol Swabs (ALCOHOL PREPS) PADS 4/[place]       . alprazolam (XANAX) 2 MG tablet Take 1 tablet (2 mg total) by mouth at bedtime as needed.  For anxiety or sleep  30 tablet  5  . Blood Glucose Monitoring Suppl (ONE TOUCH ULTRA 2) W/DEVICE KIT 1 Device by Does not apply route once.  1 each  0  . esomeprazole (NEXIUM) 40 MG capsule Take 1 capsule (40 mg total) by mouth daily before breakfast.  90 capsule  1  . Fluticasone-Salmeterol (ADVAIR DISKUS) 100-50 MCG/DOSE AEPB Inhale 1 puff into the lungs 2 (two) times daily.  1 each  11  . glucose blood (ONE TOUCH ULTRA TEST) test strip Use 6 times daily to test blood glucose dx 250.01       . ibuprofen (ADVIL,MOTRIN) 200 MG tablet Take 200 mg by mouth every 6 (six) hours as needed.        . Insulin Pen Needle (B-D ULTRAFINE III SHORT PEN) 31G X 8 MM MISC Use as directed       . loratadine (CLARITIN) 10 MG tablet Take 10 mg by mouth as needed.       . ondansetron (ZOFRAN) 4 MG tablet Take 1 tablet (4 mg total) by mouth every 8 (eight) hours as needed. For nausea  30 tablet  1  . ONE TOUCH LANCETS MISC Use 6 times daily to test blood glucose dx 250.01       .  simvastatin (ZOCOR) 40 MG tablet Take 40 mg by mouth at bedtime.         Current Facility-Administered Medications on File Prior to Visit  Medication Dose Route Frequency Provider Last Rate Last Dose  . 0.9 %  sodium chloride infusion  500 mL Intravenous Continuous Louis Meckel, MD        Allergies  Allergen Reactions  . Iohexol      Code: HIVES, Desc: PATIENT STATES HE IS ALLERGIC TO IV DYE, HIVES, RASH. 12/28/05 /RM     Family History  Problem Relation Age of Onset  . Stroke Father     strong FH cerebrovascular disease  . Diabetes Brother   . Heart disease Brother     CHF  . Colon cancer Neg Hx     BP 114/62  Pulse 60  Temp(Src) 98 F (36.7 C) (Oral)  Ht 5\' 9"  (1.753 m)  Wt 179 lb 6.4 oz (81.375 kg)  BMI 26.49 kg/m2  SpO2 95%  Review of Systems Denies loc.      Objective:   Physical Exam Pulses: dorsalis pedis intact bilat.   Feet: no deformity.  no ulcer on the feet.  feet are of normal color and temp.  no  edema Neuro: sensation is intact to touch on the feet.     Assessment & Plan:  Dm.  Pt says he is willing to try an aggressive insulin schedule.

## 2011-02-05 NOTE — Patient Instructions (Addendum)
Change current insulin to: humalog 8 units 3x a Amick (just before each meal), and: lantus 20 units at bedtime.  check your blood sugar 4 times a Fabre--before the 3 meals, and at bedtime.  also check if you have symptoms of your blood sugar being too high or too low.  please keep a record of the readings and bring it to your next appointment here.  please call us sooner if you are having low blood sugar episodes. good diet and exercise habits significanly improve the control of your diabetes.  please let me know if you wish to be referred to a dietician.  high blood sugar is very risky to your health.  you should see an eye doctor every year. controlling your blood pressure and cholesterol drastically reduces the damage diabetes does to your body.  this also applies to quitting smoking.  please discuss these with your doctor.  you should take an aspirin every [place], unless you have been advised by a doctor not to. Please make a follow-up appointment in 2 weeks.

## 2011-02-09 ENCOUNTER — Encounter: Payer: 59 | Attending: Endocrinology | Admitting: Dietician

## 2011-02-09 ENCOUNTER — Encounter: Payer: Self-pay | Admitting: Dietician

## 2011-02-09 DIAGNOSIS — E109 Type 1 diabetes mellitus without complications: Secondary | ICD-10-CM | POA: Insufficient documentation

## 2011-02-09 DIAGNOSIS — E7800 Pure hypercholesterolemia, unspecified: Secondary | ICD-10-CM | POA: Insufficient documentation

## 2011-02-09 DIAGNOSIS — Z713 Dietary counseling and surveillance: Secondary | ICD-10-CM | POA: Insufficient documentation

## 2011-02-09 NOTE — Progress Notes (Signed)
Medical Nutrition Therapy:  Appt start time: 1200 end time:  1300.  MEDICATIONS Reviewed medications.  Has not started the Lantus and Novolog regimen. Wishes to take up the 70/30 insulin.  Taking 1 shot per Topel and will at times when high (greater than 170ish will take 5-10 units to help bring his levels down.  Assessment:  Primary concerns today: Blood sugar control and cholesterol levels.  Usual eating pattern includes 1 meal per Thakur and a number of snacks in the evening..  Everyday foods include coffee.  Avoided foods include fried foods/fatty foods..   24-hr recall: B ( AM)-No food as a rule, drinks approximately 4 cups of coffee with lots of half and half and sweet and low; a a rule no lunch and no afternoon snacks.  Has an evening meal around 6:-7:00pm and then frequently snacks in the evenings..  Usual physical activity includes: No set exercise regimen.  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  Progress Towards Goal(s):  In progress.   Nutritional Diagnosis:  [place]-2.1 Inpaired nutrition utilization As related to glucose and cholesterol .  As evidenced by HgA1C level of 7.3% and need for simvastatin to control cholesterol.    Intervention:  Nutrition: See patient education for nutrition review.  Monitoring/Evaluation:  Dietary intake, exercise, blood glucose, cholesterol levels, and body weight. Follow up as needed.

## 2011-02-09 NOTE — Patient Instructions (Addendum)
-  When starting the Lantus and Novolog, make sure you check the blood glucose levels at night.  Keep your glucose records.  Problem solve the need for for snack coverage.   -Monitor blood glucose through-out the [place]. -Try glucose tabs and keep available on the job. -Wear sun glasses! -keep appointment with retinal specialist. -Have snacks available (PB crackers etc. For use during the [place].) -Transition to the Lantus and Novolog if issues Dr. Everardo All can change to Humalog. -Keep working on keeping that A1C down. -Continue to limit the saturated fats those found in animal sources.use the Cholesterol and Triglycerides handout as a reference.   Patient education materials to include handout:"Cholesterol and Triglycerides".

## 2011-02-10 ENCOUNTER — Ambulatory Visit (INDEPENDENT_AMBULATORY_CARE_PROVIDER_SITE_OTHER): Payer: 59 | Admitting: Gastroenterology

## 2011-02-10 ENCOUNTER — Encounter: Payer: Self-pay | Admitting: Gastroenterology

## 2011-02-10 DIAGNOSIS — R197 Diarrhea, unspecified: Secondary | ICD-10-CM

## 2011-02-10 DIAGNOSIS — K3184 Gastroparesis: Secondary | ICD-10-CM | POA: Insufficient documentation

## 2011-02-10 NOTE — Assessment & Plan Note (Addendum)
He has gastroparesis as determined by nuclear medicine gastric emptying scan although I'm not convinced that he is symptomatic from this. He only eats one meal a [place]. He did not tolerate Reglan.  Recommendations #1 if patient appears more symptomatic from gastroparesis  I would try him on erythromycin or domperidone

## 2011-02-10 NOTE — Assessment & Plan Note (Addendum)
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  He seems to have responded to a course of Cipro.  Recommendations #1 expectant management for now. If diarrhea recurs I would consider repeating a short course of Cipro

## 2011-02-10 NOTE — Patient Instructions (Signed)
You will need to follow up in 2-3 months

## 2011-02-10 NOTE — Progress Notes (Signed)
Mr. Twedt has returned following colonoscopy. Random biopsies were negative. A gastric emptying scan demonstrated gastroparesis. He did a short course of Reglan but stopped this because of anxiety. He took a one-week course of Cipro and reports cessation of his diarrhea. He is actually having constipation. He is somewhat unclear but claims that he has diarrhea only a couple times a week and that he also may have episodes of constipation. He has some bloating but denies nausea. Nausea tends to occur in discrete episodes only.

## 2011-02-15 ENCOUNTER — Ambulatory Visit (INDEPENDENT_AMBULATORY_CARE_PROVIDER_SITE_OTHER): Payer: 59 | Admitting: Ophthalmology

## 2011-02-15 DIAGNOSIS — E11359 Type 2 diabetes mellitus with proliferative diabetic retinopathy without macular edema: Secondary | ICD-10-CM

## 2011-02-15 DIAGNOSIS — H43819 Vitreous degeneration, unspecified eye: Secondary | ICD-10-CM

## 2011-02-17 ENCOUNTER — Other Ambulatory Visit: Payer: Self-pay | Admitting: *Deleted

## 2011-02-17 MED ORDER — ONETOUCH DELICA LANCETS MISC: Status: DC

## 2011-02-17 NOTE — Telephone Encounter (Signed)
R'cd fax from [facility] for refill of [facility] Lancets

## 2011-02-19 ENCOUNTER — Ambulatory Visit: Payer: 59 | Admitting: Endocrinology

## 2011-03-08 ENCOUNTER — Ambulatory Visit: Payer: 59 | Admitting: Endocrinology

## 2011-03-10 ENCOUNTER — Encounter: Payer: Self-pay | Admitting: Endocrinology

## 2011-03-10 ENCOUNTER — Ambulatory Visit (INDEPENDENT_AMBULATORY_CARE_PROVIDER_SITE_OTHER): Payer: 59 | Admitting: Endocrinology

## 2011-03-10 DIAGNOSIS — E109 Type 1 diabetes mellitus without complications: Secondary | ICD-10-CM

## 2011-03-10 NOTE — Progress Notes (Signed)
Subjective:    Patient ID: James [name], male    DOB: 1955/11/13, 55 y.o.   MRN: [id number]  HPI  pt had a steroid injection 2 weeks ago.  He says cbg was high for approx 1 week after this, but has since returned to what it was.  no cbg record, but states cbg's vary from 100-200.  He says his meals are irregular.  He is unaware of an pattern of cbg's throughout the [place].  He takes a widely varying dosage of insulin.  He has hypoglycemia approx 2/week.  He missed his appt a few days ago.   Past Medical History  Diagnosis Date  . DIABETES MELLITUS, TYPE I 04/03/2007  . 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 DIABETIC RETINOPATHY, PROLIFERATIVE 04/03/2007  . DM W/EYE MANIFESTATIONS, TYPE I, UNCONTROLLED 04/04/2007  . HYPERLIPIDEMIA 04/04/2007  . ANXIETY 04/03/2007  . DEPRESSION 04/03/2007  . CARPAL TUNNEL SYNDROME, BILATERAL 07/31/2007  . CORONARY ARTERY DISEASE 04/03/2007  . ASTHMATIC BRONCHITIS, ACUTE 10/25/2008  . ASTHMA 09/06/2008  . RENAL INSUFFICIENCY 07/31/2007  . CHEST PAIN 10/25/2008  . Renal insufficiency   . ED (erectile dysfunction)   . Cervical disc disease   . Bladder neck obstruction     Past Surgical History  Procedure Date  . Ptca     stent  . Spine surgery     s/p C-spine disc surgery-?fusion  . Appendectomy   . Tonsillectomy   . Stress cardiolite 09/06/2002    History   Social History  . Marital Status: Married    Spouse Name: N/A    Number of Children: 2  . Years of Education: N/A   Occupational History  .      owns a Aeronautical engineer business   Social History Main Topics  . Smoking status: Former Games developer  . Smokeless tobacco: Not on file  . Alcohol Use: No  . Drug Use: No  . Sexually Active: Not on file   Other Topics Concern  . Not on file   Social History Narrative   Insurance coverage for Chantix    Current Outpatient Prescriptions on File Prior to Visit  Medication Sig Dispense Refill  . albuterol (PROVENTIL HFA;VENTOLIN HFA) 108 (90 BASE)  MCG/ACT inhaler Inhale 2 puffs into the lungs every 4 (four) hours as needed for wheezing.  1 Inhaler  11  . Alcohol Swabs (ALCOHOL PREPS) PADS 4/Jesson       . alprazolam (XANAX) 2 MG tablet Take 1 tablet (2 mg total) by mouth at bedtime as needed. For anxiety or sleep  30 tablet  5  . Blood Glucose Monitoring Suppl (ONE TOUCH ULTRA 2) W/DEVICE KIT 1 Device by Does not apply route once.  1 each  0  . citalopram (CELEXA) 20 MG tablet 2 tabs each morning       . esomeprazole (NEXIUM) 40 MG capsule Take 1 capsule (40 mg total) by mouth daily before breakfast.  90 capsule  1  . Fluticasone-Salmeterol (ADVAIR) 100-50 MCG/DOSE AEPB Inhale 1 puff into the lungs. As needed       . glucose blood (ONE TOUCH ULTRA TEST) test strip Use 6 times daily to test blood glucose dx 250.01       . ibuprofen (ADVIL,MOTRIN) 200 MG tablet Take 200 mg by mouth every 6 (six) hours as needed.        . insulin glargine (LANTUS SOLOSTAR) 100 UNIT/ML injection Inject 20 Units into the skin at bedtime.  15 mL  12  . insulin lispro (HUMALOG KWIKPEN) 100 UNIT/ML injection Inject 8 Units into the skin 3 (three) times daily before meals. And pen needles 4/Guerrant  15 mL  12  . insulin NPH-insulin regular (NOVOLIN 70/30) (70-30) 100 UNIT/ML injection Inject into the skin. 30 units at night, then sliding scale       . Insulin Pen Needle (B-D ULTRAFINE III SHORT PEN) 31G X 8 MM MISC Use as directed       . loratadine (CLARITIN) 10 MG tablet Take 10 mg by mouth as needed.       . ondansetron (ZOFRAN) 4 MG tablet Take 1 tablet (4 mg total) by mouth every 8 (eight) hours as needed. For nausea  30 tablet  1  . ONETOUCH DELICA LANCETS MISC Use as directed four times daily dx 250.01  100 each  5  . simvastatin (ZOCOR) 40 MG tablet Take 40 mg by mouth at bedtime.          Allergies  Allergen Reactions  . Iohexol      Code: HIVES, Desc: PATIENT STATES HE IS ALLERGIC TO IV DYE, HIVES, RASH. 12/28/05 /RM     Family History  Problem Relation  Age of Onset  . Stroke Father     strong FH cerebrovascular disease  . Diabetes Brother   . Heart disease Brother     CHF  . Colon cancer Neg Hx     BP 122/78  Pulse 67  Temp(Src) 98.8 F (37.1 C) (Oral)  Ht 5\' 9"  (1.753 m)  Wt 175 lb 9.6 oz (79.652 kg)  BMI 25.93 kg/m2  SpO2 95%  Review of Systems Denies loc.      Objective:   Physical Exam GENERAL: no distress SKIN: Insulin injection sites at the anterior thighs are normal.    Assessment & Plan:  Ongoing noncompliance with cbg recording, insulin dosing, and f/u appointments.  This schedule may be too complex for him.

## 2011-03-10 NOTE — Patient Instructions (Addendum)
Please take just this insulin schedule, no matter what your blood sugar is: humalog 8 units 3x a Struble (just before each meal), and lantus 20 units at bedtime.  Please take these insulin numbers, no matter what your blood sugar is.  check your blood sugar 4 times a Hollon--before the 3 meals, and at bedtime.  also check if you have symptoms of your blood sugar being too high or too low.  please keep a record of the readings and bring it to your next appointment here.  please call us sooner if you are having low blood sugar episodes. Please make a follow-up appointment in 2 weeks.

## 2011-03-24 ENCOUNTER — Telehealth: Payer: Self-pay | Admitting: Gastroenterology

## 2011-03-24 NOTE — Telephone Encounter (Signed)
Left message for pt to call back  °

## 2011-03-24 NOTE — Telephone Encounter (Signed)
Pt states that when he saw Dr. Arlyce Dice 02/10/11 it was mentioned that he might try a course of Cipro for his diarrhea. Pt is calling asking for a prescription for the cipro. Is it ok to send this rx in for the pt? Please advise.

## 2011-03-25 MED ORDER — CIPROFLOXACIN HCL 500 MG PO TABS: 500.0000 mg | ORAL_TABLET | Freq: Two times a day (BID) | ORAL | Status: AC

## 2011-03-25 NOTE — Telephone Encounter (Signed)
cipro 500 mg bid x5 days   

## 2011-03-25 NOTE — Telephone Encounter (Signed)
Rx sent to pharmacy   

## 2011-03-30 ENCOUNTER — Encounter: Payer: Self-pay | Admitting: Endocrinology

## 2011-03-30 ENCOUNTER — Other Ambulatory Visit (INDEPENDENT_AMBULATORY_CARE_PROVIDER_SITE_OTHER): Payer: 59

## 2011-03-30 ENCOUNTER — Ambulatory Visit (INDEPENDENT_AMBULATORY_CARE_PROVIDER_SITE_OTHER): Payer: 59 | Admitting: Endocrinology

## 2011-03-30 VITALS — BP 120/60 | HR 86 | Temp 97.9°F | Ht 69.0 in | Wt 178.4 lb

## 2011-03-30 DIAGNOSIS — E109 Type 1 diabetes mellitus without complications: Secondary | ICD-10-CM

## 2011-03-30 NOTE — Progress Notes (Signed)
Subjective:    Patient ID: James [name], male    DOB: 23-Oct-1955, 55 y.o.   MRN: [id number]  HPI Pt says he has reverted to "sliding-scale" humalog.  He says cbg's have also been affected by a steroid injections into the lower back.  He says he does not eat breakfast or lunch.  he brings a record of his cbg's which i have reviewed today.  cbg is sometimes low at lunch, when he skips breakfast, but still takes humalog.  He averages 30 units of humalog per Rostron.  Pt says he likes this insulin schedule the best he has been on so far. Past Medical History  Diagnosis Date  . DIABETES MELLITUS, TYPE I 04/03/2007  . 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 DIABETIC RETINOPATHY, PROLIFERATIVE 04/03/2007  . DM W/EYE MANIFESTATIONS, TYPE I, UNCONTROLLED 04/04/2007  . HYPERLIPIDEMIA 04/04/2007  . ANXIETY 04/03/2007  . DEPRESSION 04/03/2007  . CARPAL TUNNEL SYNDROME, BILATERAL 07/31/2007  . CORONARY ARTERY DISEASE 04/03/2007  . ASTHMATIC BRONCHITIS, ACUTE 10/25/2008  . ASTHMA 09/06/2008  . RENAL INSUFFICIENCY 07/31/2007  . CHEST PAIN 10/25/2008  . Renal insufficiency   . ED (erectile dysfunction)   . Cervical disc disease   . Bladder neck obstruction     Past Surgical History  Procedure Date  . Ptca     stent  . Spine surgery     s/p C-spine disc surgery-?fusion  . Appendectomy   . Tonsillectomy   . Stress cardiolite 09/06/2002    History   Social History  . Marital Status: Married    Spouse Name: N/A    Number of Children: 2  . Years of Education: N/A   Occupational History  .      owns a Aeronautical engineer business   Social History Main Topics  . Smoking status: Former Games developer  . Smokeless tobacco: Former Neurosurgeon    Quit date: 02/16/2010  . Alcohol Use: No  . Drug Use: No  . Sexually Active: Not Currently   Other Topics Concern  . Not on file   Social History Narrative   Insurance coverage for Chantix    Current Outpatient Prescriptions on File Prior to Visit  Medication Sig  Dispense Refill  . Alcohol Swabs (ALCOHOL PREPS) PADS 4/[place]       . alprazolam (XANAX) 2 MG tablet Take 1 tablet (2 mg total) by mouth at bedtime as needed. For anxiety or sleep  30 tablet  5  . Blood Glucose Monitoring Suppl (ONE TOUCH ULTRA 2) W/DEVICE KIT 1 Device by Does not apply route once.  1 each  0  . ciprofloxacin (CIPRO) 500 MG tablet Take 1 tablet (500 mg total) by mouth 2 (two) times daily.  10 tablet  0  . citalopram (CELEXA) 20 MG tablet 2 tabs each morning       . esomeprazole (NEXIUM) 40 MG capsule Take 1 capsule (40 mg total) by mouth daily before breakfast.  90 capsule  1  . glucose blood (ONE TOUCH ULTRA TEST) test strip Use 6 times daily to test blood glucose dx 250.01       . ibuprofen (ADVIL,MOTRIN) 200 MG tablet Take 200 mg by mouth every 6 (six) hours as needed.        . insulin glargine (LANTUS SOLOSTAR) 100 UNIT/ML injection Inject 20 Units into the skin at bedtime.  15 mL  12  . insulin lispro (HUMALOG KWIKPEN) 100 UNIT/ML injection Inject 8 Units into the skin 3 (three) times  daily before meals. And pen needles 4/[place]ott  15 mL  12  . Insulin Pen Needle (B-D ULTRAFINE III SHORT PEN) 31G X 8 MM MISC Use as directed       . ONETOUCH DELICA LANCETS MISC Use as directed four times daily dx 250.01  100 each  5  . simvastatin (ZOCOR) 40 MG tablet Take 40 mg by mouth at bedtime.        [name] albuterol (PROVENTIL HFA;VENTOLIN HFA) 108 (90 BASE) MCG/ACT inhaler Inhale 2 puffs into the lungs every 4 (four) hours as needed for wheezing.  1 Inhaler  11  . Fluticasone-Salmeterol (ADVAIR) 100-50 MCG/DOSE AEPB Inhale 1 puff into the lungs. As needed       . loratadine (CLARITIN) 10 MG tablet Take 10 mg by mouth as needed.         Allergies  Allergen Reactions  . Iohexol      Code: HIVES, Desc: PATIENT STATES HE IS ALLERGIC TO IV DYE, HIVES, RASH. 12/28/05 /RM     Family History  Problem Relation Age of Onset  . Stroke Father     strong FH cerebrovascular disease  . Diabetes Brother    . Heart disease Brother     CHF  . Colon cancer Neg Hx     BP 120/60  Pulse 86  Temp(Src) 97.9 F (36.6 C) (Oral)  Ht 5\' 9"  (1.753 m)  Wt 178 lb 6.4 oz (80.922 kg)  BMI 26.35 kg/m2  SpO2 97%    Review of Systems Denies loc.      Objective:   Physical Exam VITAL SIGNS:  See vs page GENERAL: no distress PSYCH: Alert and oriented x 3.  Does not appear anxious nor depressed.  Lab Results  Component Value Date   HGBA1C 8.0* 03/30/2011      Assessment & Plan:  Dm.  Pt has struggled with various insulin schedules, but he likes this one the best.  However, he may do better with a simpler schedule, though.

## 2011-03-30 NOTE — Patient Instructions (Addendum)
Please take just this insulin schedule, no matter what your blood sugar is: humalog 8 units 3x a Kluck (just before each meal), and lantus 20 units at bedtime.  Please take these insulin numbers, no matter what your blood sugar is.  check your blood sugar 4 times a Elpers--before the 3 meals, and at bedtime.  also check if you have symptoms of your blood sugar being too high or too low.  please keep a record of the readings and bring it to your next appointment here.  please call us sooner if you are having low blood sugar episodes. Please make a follow-up appointment in 2 weeks. Also, take extra humalog: < 200----none 200-299: 2 units Over 300: 3 units. Please come back for a follow-up appointment for 1 month.  Please make an appointment. blood tests are being requested for you today.  please call [phone] to hear your test results.  You will be prompted to enter the 9-digit "MRN" number that appears at the top left of this page, followed by #.  Then you will hear the message.

## 2011-03-31 ENCOUNTER — Telehealth: Payer: Self-pay | Admitting: *Deleted

## 2011-03-31 ENCOUNTER — Inpatient Hospital Stay (HOSPITAL_COMMUNITY)
Admission: EM | Admit: 2011-03-31 | Discharge: 2011-04-01 | DRG: 639 | Disposition: A | Discharge status: Home / Self Care | Payer: 59 | Attending: Internal Medicine | Admitting: Internal Medicine

## 2011-03-31 ENCOUNTER — Encounter: Payer: Self-pay | Admitting: Internal Medicine

## 2011-03-31 ENCOUNTER — Telehealth: Payer: Self-pay

## 2011-03-31 ENCOUNTER — Ambulatory Visit (INDEPENDENT_AMBULATORY_CARE_PROVIDER_SITE_OTHER): Payer: 59 | Admitting: Internal Medicine

## 2011-03-31 ENCOUNTER — Other Ambulatory Visit: Payer: Self-pay | Admitting: Endocrinology

## 2011-03-31 VITALS — BP 102/72 | HR 100 | Temp 97.9°F | Resp 16 | Wt 167.0 lb

## 2011-03-31 DIAGNOSIS — F329 Major depressive disorder, single episode, unspecified: Secondary | ICD-10-CM | POA: Diagnosis present

## 2011-03-31 DIAGNOSIS — Z87891 Personal history of nicotine dependence: Secondary | ICD-10-CM

## 2011-03-31 DIAGNOSIS — E785 Hyperlipidemia, unspecified: Secondary | ICD-10-CM | POA: Diagnosis present

## 2011-03-31 DIAGNOSIS — I951 Orthostatic hypotension: Secondary | ICD-10-CM | POA: Diagnosis present

## 2011-03-31 DIAGNOSIS — E860 Dehydration: Secondary | ICD-10-CM | POA: Insufficient documentation

## 2011-03-31 DIAGNOSIS — E1010 Type 1 diabetes mellitus with ketoacidosis without coma: Principal | ICD-10-CM | POA: Diagnosis present

## 2011-03-31 DIAGNOSIS — F411 Generalized anxiety disorder: Secondary | ICD-10-CM | POA: Diagnosis present

## 2011-03-31 DIAGNOSIS — E11319 Type 2 diabetes mellitus with unspecified diabetic retinopathy without macular edema: Secondary | ICD-10-CM | POA: Diagnosis present

## 2011-03-31 DIAGNOSIS — E1039 Type 1 diabetes mellitus with other diabetic ophthalmic complication: Secondary | ICD-10-CM | POA: Diagnosis present

## 2011-03-31 DIAGNOSIS — Z794 Long term (current) use of insulin: Secondary | ICD-10-CM

## 2011-03-31 DIAGNOSIS — F3289 Other specified depressive episodes: Secondary | ICD-10-CM | POA: Diagnosis present

## 2011-03-31 DIAGNOSIS — K5289 Other specified noninfective gastroenteritis and colitis: Secondary | ICD-10-CM | POA: Diagnosis present

## 2011-03-31 LAB — DIFFERENTIAL
Basophils Absolute: 0 10*3/uL (ref 0.0–0.1)
Eosinophils Relative: 0 % (ref 0–5)
Lymphocytes Relative: 7 % — ABNORMAL LOW (ref 12–46)
Monocytes Absolute: 0.4 10*3/uL (ref 0.1–1.0)
Monocytes Relative: 4 % (ref 3–12)

## 2011-03-31 LAB — URINALYSIS, ROUTINE W REFLEX MICROSCOPIC
Bilirubin Urine: NEGATIVE
Ketones, ur: >80 mg/dL — AB
Leukocytes, UA: NEGATIVE
Nitrite: NEGATIVE
Protein, ur: NEGATIVE mg/dL
Urobilinogen, UA: 0.2 mg/dL (ref 0.0–1.0)

## 2011-03-31 LAB — CBC
HCT: 45.3 % (ref 39.0–52.0)
MCH: 32.1 pg (ref 26.0–34.0)
MCHC: 35.1 g/dL (ref 30.0–36.0)
RDW: 12.5 % (ref 11.5–15.5)

## 2011-03-31 LAB — POCT I-STAT, CHEM 8
Calcium, Ion: 1.1 mmol/L — ABNORMAL LOW (ref 1.12–1.32)
Creatinine, Ser: 0.9 mg/dL (ref 0.50–1.35)
Glucose, Bld: 305 mg/dL — ABNORMAL HIGH (ref 70–99)
HCT: 48 % (ref 39.0–52.0)
Hemoglobin: 16.3 g/dL (ref 13.0–17.0)
Potassium: 4.6 mEq/L (ref 3.5–5.1)
TCO2: 14 mmol/L (ref 0–100)

## 2011-03-31 LAB — CK TOTAL AND CKMB (NOT AT ARMC)
CK, MB: 1.7 ng/mL (ref 0.3–4.0)
Relative Index: INVALID (ref 0.0–2.5)
Total CK: 30 U/L (ref 7–232)

## 2011-03-31 MED ORDER — ONDANSETRON HCL 4 MG PO TABS: 4.0000 mg | ORAL_TABLET | Freq: Three times a day (TID) | ORAL | Status: DC | PRN

## 2011-03-31 NOTE — Telephone Encounter (Signed)
Ov today, any dr

## 2011-03-31 NOTE — Telephone Encounter (Signed)
Pt's spouse states that pt cannot keep anything down and is now dry heaving. Pt's spouse is requesting rx for Zofran be changed to suppositories and sent to [facility] [address]

## 2011-03-31 NOTE — Progress Notes (Signed)
  Subjective:    Patient ID: James [name], male    DOB: February 02, 1956, 55 y.o.   MRN: [id number]  HPI New to me he complains of a 4 Ebey history of watery diarrhea, nausea, intractable vomiting despite zofran, weakness, dizziness, high blood sugar, headache, chills, and insomnia.   Review of Systems  Constitutional: Positive for chills, activity change, appetite change and fatigue. Negative for fever, diaphoresis and unexpected weight change.  HENT: Positive for trouble swallowing. Negative for sore throat, facial swelling, neck pain, neck stiffness and voice change.   Eyes: Negative for photophobia, pain, discharge, redness, itching and visual disturbance.  Respiratory: Negative for apnea, cough, choking, chest tightness, shortness of breath, wheezing and stridor.   Cardiovascular: Negative for chest pain and leg swelling.  Gastrointestinal: Positive for nausea, vomiting and diarrhea. Negative for abdominal pain, constipation, blood in stool, abdominal distention, anal bleeding and rectal pain.  Genitourinary: Negative for dysuria, urgency, frequency, hematuria, flank pain, decreased urine volume, enuresis and difficulty urinating.  Skin: Negative for color change, pallor, rash and wound.  Neurological: Positive for dizziness, light-headedness and headaches. Negative for tremors, seizures, syncope, facial asymmetry, speech difficulty and numbness.  Hematological: Negative for adenopathy. Does not bruise/bleed easily.  Psychiatric/Behavioral: Positive for sleep disturbance. Negative for suicidal ideas, hallucinations, behavioral problems, confusion, self-injury, dysphoric mood, decreased concentration and agitation. The patient is not nervous/anxious and is not hyperactive.        Objective:   Physical Exam  Vitals reviewed. Constitutional: He is oriented to person, place, and time. He appears lethargic.  Non-toxic appearance. He has a sickly appearance. He appears ill. He appears distressed.    BP supine 134/78 Pulse=100 BP upright 120/68 Pulse=110  HENT:  Mouth/Throat: No oropharyngeal exudate.  Eyes: Conjunctivae are normal. Right eye exhibits no discharge. Left eye exhibits no discharge. No scleral icterus.  Neck: Normal range of motion. Neck supple. No JVD present. No tracheal deviation present. No thyromegaly present.  Cardiovascular: Normal rate, regular rhythm, normal heart sounds and intact distal pulses.  Exam reveals no gallop and no friction rub.   No murmur heard. Pulmonary/Chest: Effort normal and breath sounds normal. No stridor. No respiratory distress. He has no wheezes. He has no rales. He exhibits no tenderness.  Abdominal: Soft. Bowel sounds are normal. He exhibits no distension and no mass. There is no tenderness. There is no rebound and no guarding.  Musculoskeletal: Normal range of motion. He exhibits no edema and no tenderness.  Lymphadenopathy:    He has no cervical adenopathy.  Neurological: He is oriented to person, place, and time. He appears lethargic.  Skin: Skin is warm. No rash noted. He is diaphoretic. No erythema. No pallor.  Psychiatric: He has a normal mood and affect. His behavior is normal. Judgment and thought content normal.          Assessment & Plan:

## 2011-03-31 NOTE — Telephone Encounter (Signed)
Pt's spouse advised and will call back if needed.

## 2011-03-31 NOTE — Telephone Encounter (Signed)
Pt's spouse called stating pt informed MD at [facility] yesterday that he has been experiencing some nausea but was not Rx'd any medication. Spouse now says that pt has been vomiting since returning from [facility]. Spouse is requesting advisement and Rx for phenergan suppositories for pt, please advise.

## 2011-03-31 NOTE — Telephone Encounter (Signed)
Appointment scheduled today.

## 2011-03-31 NOTE — H&P (Signed)
NAMERASHAD, AULD NO.:  [id number]  MEDICAL RECORD NO.:  [id number]  LOCATION:  WLED                         FACILITY:  [facility]  PHYSICIAN:  Talmage Nap, MD  DATE OF BIRTH:  Apr 06, 1956  DATE OF ADMISSION:  03/31/2011 DATE OF DISCHARGE:                             HISTORY & PHYSICAL   PRIMARY CARE PHYSICIAN:  Sean A. Everardo All, MD, [place]/also Endocrinologist.  History obtainable from the patient.  CHIEF COMPLAINT:  Diarrhea and vomiting of about 2 days' duration.  HISTORY OF PRESENT ILLNESS:  The patient is a very pleasant 55 year old Caucasian male with history of diabetes mellitus, anxiety disorder, and hyperlipidemia, presenting to the emergency room with diarrhea and vomiting of about 2 days' duration and this was said to have been getting progressively worse.  The patient claimed that he had been in stable health until 2 days prior to presenting to the emergency room, he had a meal of brock and thereafter he became nauseated and was having multiple episodes of vomiting.  The vomiting was nonprojectile and was nonbloody.  He also had multiple episodes of diarrhea, which was nonbloody.  He denied any associated abdominal pain.  He had a subjective feeling of fever and chills, but denied any rigor.  He claimed he also had been precordial discomfort, but denied any shortness of breath.  He denied any dysuria or hematuria.  Symptoms were said to be getting progressively worse and the patient was said to be getting progressively weak and tired, and subsequently presented to his primary care physician who now asked the patient to come to the emergency room to be evaluated.  PAST MEDICAL HISTORY:  Positive for: 1. Diabetes mellitus. 2. Anxiety disorder. 3. Hyperlipidemia. 4. Depression. 5. Diabetic retinopathy.  PAST SURGICAL HISTORY: 1. Appendectomy. 2. Cervical diskectomy with screw implant. 3. Tonsillectomy. 4. Groin lymph node  biopsy.  PREADMISSION MEDICATIONS: 1. Lantus insulin 20 units subcu q.h.s. 2. Humalog insulin 8 units t.i.d. with meals. 3. Insulin sliding scale. 4. Zocor 40 mg p.o. daily. 5. Xanax 2 mg p.o. q.h.s. 6. Citalopram 40 mg p.o. daily.  ALLERGIES:  IV DYE.  SOCIAL HISTORY:  The patient smoked about a pack of cigarettes per [place] for the past 25-30 years, quit smoking about 12 months ago.  Denies any history of alcohol use and he used to work in a [employer], now he is self-employed, cutting grasses.  FAMILY HISTORY:  York Spaniel to be positive for diabetes mellitus.  REVIEW OF SYSTEMS:  The patient denies any history of headaches.  Denies any blurred vision.  Complains about nausea, but no vomiting.  Also, complains about dryness of the mouth, weakness, and thirsty.  No chest pain.  No shortness of breath.  Denies any fever, chills, or rigor.  No abdominal discomfort.  Presently, denies any diarrhea or hematochezia. No dysuria or hematuria.  No swelling of the lower extremities.  No intolerance to heat or cold.  No neuropsychiatric disorder.  PHYSICAL EXAMINATION:  GENERAL:  Very pleasant middle-aged man, dehydrated, not in any respiratory distress. PRESENT VITAL SIGNS:  Blood pressure is 149/67, pulse is 93, respiratory rate is 21, temperature is 97.9. HEENT:  Mild pallor, but pupils are reactive to light and extraocular muscles are intact. NECK:  He has no jugular venous distention.  No carotid bruit.  No lymphadenopathy. CHEST:  Clear to auscultation. HEART:  Sounds are 1 and 2. ABDOMEN:  Soft, nontender.  Liver, spleen, kidney not palpable.  Bowel sounds are positive. EXTREMITIES:  No pedal edema. NEUROLOGIC EXAM:  Nonfocal. MUSCULOSKELETAL SYSTEM:  Unremarkable. SKIN:  Shows decreased turgor.  LABORATORY DATA:  Venous blood done showed a pH of 7.29, pCO2 of 33, pO2 of 30.4, bicarb of 10.4.  Chemistry showed sodium of 135, potassium of 4.6, chloride of 107, BUN was  27, creatinine 0.90, glucose was 305. Hematological indices showed WBC of 8.6, hemoglobin of 15.9, hematocrit of 45.3, MCV of 91.2 with a platelet count of 262, neutrophils 88%, and absolute neutrophil count 7.6.  CBG done at the time of this history and physical was 247.  IMPRESSION: 1. Diabetic ketoacidosis. 2. Dehydration. 3. Gastroenteritis, most likely viral in origin. 4. Hyperlipidemia. 5. Anxiety disorder. 6. Depression.  PLAN:  To admit the patient to Telemetry.  The patient will continue with half-normal saline IV to go at rate of 200 cc an hour.  He will also continue with a glucose stabilizer; however, when CBG is less than 250 mg%, IV insulin will be discontinued.  Lantus insulin 20 units subcu will be given at 30 minutes before stopping the IV insulin.  We will also stop q.hourly Accu-Cheks.  Accu-Chek should be done q.4 hourly plus a.c. and h.s. with regular insulin sliding scale and the scale is as follows:  Blood sugar level 150-200, 2 units of regular insulin; 201- 250, 4 units; 251-300, 6 units; 301-350, 8 units; 351-400, 10 units; greater than 400 mg%, call MD.  BMP will be done q.4 hourly.  The patient will also have Lantus insulin 20 units subcu q.h.s.  He will be given Zofran 4 mg IV q.4 p.r.n. for nausea, Xanax 2 mg p.o. q.h.s. for anxiety, citalopram 40 mg p.o. daily for depression, and Zocor 40 mg p.o. daily for hyperlipidemia.  The patient will be on Protonix 40 mg p.o. daily for GI prophylaxis and TED stockings for DVT prophylaxis.  Further workup to be done on this patient will include cardiac enzymes q.6 x3, hemoglobin A1c.  CBC, CMP, and magnesium will be done in a.m.  The patient will be followed and evaluated on a [place] basis.     Talmage Nap, MD     CN/MEDQ  D:  03/31/2011  T:  03/31/2011  Job:  [id number]  Electronically Signed by Talmage Nap  on 03/31/2011 10:51:46 PM

## 2011-03-31 NOTE — Assessment & Plan Note (Signed)
He appears to have had a viral GE complicated by diabetic gastroparesis and now he appears dehydrated +/- DKA, his wife will take him to the ER for eval and IV fluids

## 2011-03-31 NOTE — Telephone Encounter (Signed)
i sent rx for ondansetron. Ov tomorrow if not better.

## 2011-04-01 LAB — CARDIAC PANEL(CRET KIN+CKTOT+MB+TROPI)
CK, MB: 1.6 ng/mL (ref 0.3–4.0)
Relative Index: INVALID (ref 0.0–2.5)
Relative Index: INVALID (ref 0.0–2.5)
Total CK: 25 U/L (ref 7–232)
Troponin I: <0.3 ng/mL (ref <0.30)
Troponin I: <0.3 ng/mL (ref <0.30)

## 2011-04-01 LAB — GLUCOSE, CAPILLARY
Glucose-Capillary: 141 mg/dL — ABNORMAL HIGH (ref 70–99)
Glucose-Capillary: 178 mg/dL — ABNORMAL HIGH (ref 70–99)
Glucose-Capillary: 222 mg/dL — ABNORMAL HIGH (ref 70–99)
Glucose-Capillary: 277 mg/dL — ABNORMAL HIGH (ref 70–99)

## 2011-04-01 LAB — CBC
HCT: 37 % — ABNORMAL LOW (ref 39.0–52.0)
Hemoglobin: 13 g/dL (ref 13.0–17.0)
MCH: 31.9 pg (ref 26.0–34.0)
MCHC: 35.1 g/dL (ref 30.0–36.0)
RDW: 12.8 % (ref 11.5–15.5)

## 2011-04-01 LAB — COMPREHENSIVE METABOLIC PANEL
Albumin: 3.3 g/dL — ABNORMAL LOW (ref 3.5–5.2)
Alkaline Phosphatase: 80 U/L (ref 39–117)
BUN: 20 mg/dL (ref 6–23)
Calcium: 8.2 mg/dL — ABNORMAL LOW (ref 8.4–10.5)
GFR calc Af Amer: >60 mL/min (ref >60)
Glucose, Bld: 176 mg/dL — ABNORMAL HIGH (ref 70–99)
Potassium: 4.2 mEq/L (ref 3.5–5.1)
Total Protein: 5.9 g/dL — ABNORMAL LOW (ref 6.0–8.3)

## 2011-04-01 LAB — BASIC METABOLIC PANEL
BUN: 15 mg/dL (ref 6–23)
CO2: 22 mEq/L (ref 19–32)
Chloride: 104 mEq/L (ref 96–112)
Creatinine, Ser: 0.7 mg/dL (ref 0.50–1.35)
GFR calc Af Amer: >60 mL/min (ref >60)
Glucose, Bld: 138 mg/dL — ABNORMAL HIGH (ref 70–99)
Potassium: 4.2 mEq/L (ref 3.5–5.1)

## 2011-04-01 LAB — DIFFERENTIAL
Basophils Absolute: 0 10*3/uL (ref 0.0–0.1)
Basophils Relative: 0 % (ref 0–1)
Eosinophils Relative: 1 % (ref 0–5)
Lymphocytes Relative: 24 % (ref 12–46)
Monocytes Absolute: 0.5 10*3/uL (ref 0.1–1.0)
Monocytes Relative: 8 % (ref 3–12)

## 2011-04-01 LAB — BLOOD GAS, VENOUS
Acid-base deficit: 9.8 mmol/L — ABNORMAL HIGH (ref 0.0–2.0)
O2 Saturation: 52.9 %
Patient temperature: 37
pO2, Ven: 30.4 mmHg (ref 30.0–45.0)

## 2011-04-01 LAB — HEMOGLOBIN A1C: Mean Plasma Glucose: 183 mg/dL — ABNORMAL HIGH (ref <117)

## 2011-04-01 LAB — MAGNESIUM: Magnesium: 2 mg/dL (ref 1.5–2.5)

## 2011-04-15 ENCOUNTER — Encounter: Payer: 59 | Attending: Endocrinology | Admitting: Dietician

## 2011-04-15 DIAGNOSIS — E109 Type 1 diabetes mellitus without complications: Secondary | ICD-10-CM | POA: Insufficient documentation

## 2011-04-15 DIAGNOSIS — Z713 Dietary counseling and surveillance: Secondary | ICD-10-CM | POA: Insufficient documentation

## 2011-04-15 DIAGNOSIS — E7800 Pure hypercholesterolemia, unspecified: Secondary | ICD-10-CM | POA: Insufficient documentation

## 2011-04-15 NOTE — Patient Instructions (Signed)
   Take the Lantus at the same time each night.  11:00 PM  When [facility] obtains and I-Pro , Continuous Glucose Monitor for use for short times, (3 days) We need to consider working with Dr. Everardo All to let you try wearing one to get a picture of how your glucose really varies.  Carb counting resources:  Your need to read labels if you are eating when your wife is not there to help you.  Use the CalorieKing.com web site.  Google [employer] site.   Set an alarm to take your Lantus

## 2011-04-15 NOTE — Progress Notes (Signed)
  Medical Nutrition Therapy:  Appt start time: 1130 end time:  1200.  Assessment:  Primary concerns today: Recent hospitalization with DKA and insulin changes.  Unsure if" he was sick and developed DKA or he had high blood sugars and developed DKA".  AT the time of hospitalization, he saw Lenor Coffin, CDE and she assisted with changing his insulin regimen.  Her recommendation was to take his Lantus at night  (30 unitsand she advised him to use his Humalog for correction dosing (1 unit Humalog for every 25 mg over 120 mg of blood glucose.)  His meal coverage was dosed at 1 unit Humalog for every 8 gm of carbohydrate.   Mr. Sevigny continues to have a variety of blood glucose levels.  He forgets to take his Lantus and has high morning blood glucose levels.  He will have some low's 60-70 during the [place] and then Laconya Clere experience a higher reading later.  He has had some lows late in the afternoon prior to the evening meal.  He continues his routine of drinking his coffee, but not eating breakfast or lunch and then having a large meal in the evening.  He is not doing his carb counting.  He relies on his wife to carb count for him and "since I don't eat breakfast or lunch, I really don't need to count them when she will count the evening meal and help me figure out my insulin dose."    Agreed that he will try to take the Lantus at 11:00 PM each evening.  Advised him to try setting an alarm in the house to remind him.  We discussed the possibility of getting an order from Dr. Everardo All to allow him to wear an I-Pro (continuious glucose monitoring device) for 2-4 days.  Continued to encourage him to participate in his carbohydrate counting and insulin dosing.  Continued to encourage him to have his glucose meter available on the job for the times he Oriya Kettering be going low or when is high in the morning.  Noted he needed to have a way to call for help when he is on the job alone.  He is to follow-up with [place]ford Mon and if he has  questions or concerns, he is to call me.  When we have the ability to provide continuous glucose monitoring, we will contact him.   MEDICATIONS: Insulin changes:  Humalog for correction dose and carbs: 1 unit for each 8 gm of carbohydrate eaten.  Lantus is at 30 units at bedtime.  Recent physical activity: C/O decreased energy level with the recent illness/hospitalization.  Progress Towards Goal(s):  Some progress.   Nutritional Diagnosis:  [place]-2.1 Inpaired nutrition utilization As related to glucose.  As evidenced by recent HgA1C of 13% (04/01/2011).    Intervention:  Nutrition Wife is working to help him with his carb counting and insulin dosing.  I tried to encourage his participation in his diabetes self-management.  We reviewed the interventions that he could do on a daily basis to help with decreasing his blood glucose.  Handouts given during visit include:  Novo Nordisk carb counting booklet  Yellow card with serving sizes.  Web site resources for portions and carbs (calorieking.com)    Monitoring/Evaluation:  Dietary intake, exercise, blood glucose levels, and body weight when needed.  To call with questions.

## 2011-04-19 LAB — URINE MICROSCOPIC-ADD ON

## 2011-04-19 LAB — URINALYSIS, ROUTINE W REFLEX MICROSCOPIC
Glucose, UA: 500 — AB
Ketones, ur: NEGATIVE
Leukocytes, UA: NEGATIVE
Protein, ur: NEGATIVE
pH: 7.5

## 2011-04-19 LAB — CBC
HCT: 45.2
MCHC: 34.9
MCV: 95.6
Platelets: 274
RBC: 4.73
WBC: 8.5

## 2011-04-19 LAB — GLUCOSE, CAPILLARY
Glucose-Capillary: 142 — ABNORMAL HIGH
Glucose-Capillary: 204 — ABNORMAL HIGH
Glucose-Capillary: 204 — ABNORMAL HIGH
Glucose-Capillary: 251 — ABNORMAL HIGH
Glucose-Capillary: 272 — ABNORMAL HIGH

## 2011-04-19 LAB — BASIC METABOLIC PANEL
BUN: 15
CO2: 27
Chloride: 105
Creatinine, Ser: 0.9
Potassium: 5.1

## 2011-04-23 ENCOUNTER — Other Ambulatory Visit: Payer: Self-pay | Admitting: Endocrinology

## 2011-04-23 ENCOUNTER — Telehealth: Payer: Self-pay | Admitting: *Deleted

## 2011-04-23 MED ORDER — INSULIN GLARGINE 100 UNIT/ML ~~LOC~~ SOLN: 30.0000 [IU] | Freq: Every day | SUBCUTANEOUS | Status: DC

## 2011-04-23 MED ORDER — INSULIN LISPRO 100 UNIT/ML ~~LOC~~ SOLN: 8.0000 [IU] | Freq: Three times a day (TID) | SUBCUTANEOUS | Status: DC

## 2011-04-23 NOTE — Telephone Encounter (Signed)
Pt's wife requesting callback regarding Humalog rx. Left message for pt to callback office

## 2011-04-23 NOTE — Telephone Encounter (Signed)
Rx sent to [facility], spoke with pharmacist, rx received, they will inform pt.

## 2011-04-29 ENCOUNTER — Ambulatory Visit: Payer: 59 | Admitting: Endocrinology

## 2011-04-29 DIAGNOSIS — Z0289 Encounter for other administrative examinations: Secondary | ICD-10-CM

## 2011-04-29 LAB — CBC
MCV: 94.7
Platelets: 315
RBC: 5.02
WBC: 8.7

## 2011-04-29 LAB — BASIC METABOLIC PANEL
BUN: 22
Chloride: 103
Creatinine, Ser: 0.9
GFR calc Af Amer: >60
GFR calc non Af Amer: >60

## 2011-05-03 ENCOUNTER — Other Ambulatory Visit: Payer: Self-pay | Admitting: Endocrinology

## 2011-05-03 NOTE — Telephone Encounter (Signed)
Rx faxed to [facility].

## 2011-05-03 NOTE — Telephone Encounter (Signed)
Last written 11/06/2010 #30 with 5 refills-please advise

## 2011-05-03 NOTE — Telephone Encounter (Signed)
i printed Ov is due 

## 2011-05-04 NOTE — Discharge Summary (Signed)
  NAMEVISHAL, James [name]                   ACCOUNT NO.:  [id number]  MEDICAL RECORD NO.:  [id number]  LOCATION:  1510                         FACILITY:  [facility]  PHYSICIAN:  Keyonta Madrid I Malcome Ambrocio, MD      DATE OF BIRTH:  1955/08/21  DATE OF ADMISSION:  03/31/2011 DATE OF DISCHARGE:  04/01/2011                              DISCHARGE SUMMARY   PRIMARY CARE PHYSICIAN:  Sean A. Everardo All, MD, from [place].  DISCHARGE DIAGNOSES: 1. Nausea and vomiting, thought to be secondary to gastroenteritis,     resolved. 2. Early diabetic ketoacidosis, resolved. 3. Uncontrolled diabetes mellitus. 4. Orthostatic hypotension secondary to dehydration. 5. Diabetes mellitus. 6. Anxiety disorder. 7. Hyperlipidemia. 8. Depression.  DISCHARGE MEDICATIONS: 1. Insulin lantus 20 units subcu q.h.s. 2. Humalog 8 units t.i.d. a.c. 3. Insulin sliding scale. 4. Zocor 40 mg p.o. daily. 5. Xanax 2 mg p.o. q.h.s. 6. Celexa 40 mg p.o. daily.  This is a 55 year old Caucasian male with a history of diabetes mellitus, anxiety disorder, and hyperlipidemia, who presented to the ED with diarrhea and vomiting for about 2 days and this was said to have been getting progressively worse.  He became nauseated and he has subjective feeling of fever and chills.  His symptoms progressively got worse.  He had to skip taking his insulin.  Patient denies any chest pain or shortness of breath.  On evaluation in the ED, patient found to have blood pressure of 149/67, pulse rate 93, respiratory rate 21, temperature 97.9.   Dictation ended at this point.     Aliyana Dlugosz Bosie Helper, MD    HIE/MEDQ  D:  04/01/2011  T:  04/02/2011  Job:  [id number]  Electronically Signed by [name] MD on 05/04/2011 11:48:31 AM

## 2011-05-10 ENCOUNTER — Encounter: Payer: Self-pay | Admitting: Endocrinology

## 2011-05-10 ENCOUNTER — Ambulatory Visit (INDEPENDENT_AMBULATORY_CARE_PROVIDER_SITE_OTHER): Payer: 59 | Admitting: Endocrinology

## 2011-05-10 DIAGNOSIS — E109 Type 1 diabetes mellitus without complications: Secondary | ICD-10-CM

## 2011-05-10 MED ORDER — ALPRAZOLAM 2 MG PO TABS: 2.0000 mg | ORAL_TABLET | Freq: Every evening | ORAL | Status: DC | PRN

## 2011-05-10 MED ORDER — INSULIN ASPART PROT & ASPART (70-30 MIX) 100 UNIT/ML ~~LOC~~ SUSP: 25.0000 [IU] | Freq: Two times a day (BID) | SUBCUTANEOUS | Status: DC

## 2011-05-10 NOTE — Patient Instructions (Signed)
Change both current insulins to novolog 70/20, 25 units 2x a Daniel[place] (with first and last meals of the Monest[place].   check your blood sugar 4 times a [place].  vary the time of [place] when you check, between before the 3 meals, and at bedtime.  also check if you have symptoms of your blood sugar being too high or too low.  please keep a record of the readings and bring it to your next appointment here.  please call us sooner if you are having low blood sugar episodes, or if it stays over 200. Please come back for a follow-up appointment in 2 weeks

## 2011-05-10 NOTE — Progress Notes (Signed)
Subjective:    Patient ID: James [name], male    DOB: Dec 07, 1955, 55 y.o.   MRN: [id number]  HPI Pt says he is missing fewer lantus injections.  He says he does not miss humalog.  Pt states cbg's are low several times per week.  He takes a widely varying dosage of humalog, adding up to 10-24 total units per [place].  he brings a record of his cbg's which i have reviewed today.  cbg varies from 69-350.  There is no trend throughout the [place].   He missed his appointment last week.  He did not call to retrieve phone-tree message about his last a1c.   Past Medical History  Diagnosis Date  . DIABETES MELLITUS, TYPE I 04/03/2007  . 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 DIABETIC RETINOPATHY, PROLIFERATIVE 04/03/2007  . DM W/EYE MANIFESTATIONS, TYPE I, UNCONTROLLED 04/04/2007  . HYPERLIPIDEMIA 04/04/2007  . ANXIETY 04/03/2007  . DEPRESSION 04/03/2007  . CARPAL TUNNEL SYNDROME, BILATERAL 07/31/2007  . CORONARY ARTERY DISEASE 04/03/2007  . ASTHMATIC BRONCHITIS, ACUTE 10/25/2008  . ASTHMA 09/06/2008  . RENAL INSUFFICIENCY 07/31/2007  . CHEST PAIN 10/25/2008  . Renal insufficiency   . ED (erectile dysfunction)   . Cervical disc disease   . Bladder neck obstruction     Past Surgical History  Procedure Date  . Ptca     stent  . Spine surgery     s/p C-spine disc surgery-?fusion  . Appendectomy   . Tonsillectomy   . Stress cardiolite 09/06/2002    History   Social History  . Marital Status: Married    Spouse Name: N/A    Number of Children: 2  . Years of Education: N/A   Occupational History  .      owns a Aeronautical engineer business   Social History Main Topics  . Smoking status: Former Games developer  . Smokeless tobacco: Former Neurosurgeon    Quit date: 02/16/2010  . Alcohol Use: No  . Drug Use: No  . Sexually Active: Not Currently   Other Topics Concern  . Not on file   Social History Narrative   Insurance coverage for Chantix    Current Outpatient Prescriptions on File Prior to Visit    Medication Sig Dispense Refill  . Alcohol Swabs (ALCOHOL PREPS) PADS 4/Seres       . Blood Glucose Monitoring Suppl (ONE TOUCH ULTRA 2) W/DEVICE KIT 1 Device by Does not apply route once.  1 each  0  . citalopram (CELEXA) 20 MG tablet 2 tabs each morning       . esomeprazole (NEXIUM) 40 MG capsule Take 1 capsule (40 mg total) by mouth daily before breakfast.  90 capsule  1  . glucose blood (ONE TOUCH ULTRA TEST) test strip Use 6 times daily to test blood glucose dx 250.01       . ibuprofen (ADVIL,MOTRIN) 200 MG tablet Take 200 mg by mouth every 6 (six) hours as needed.        . Insulin Pen Needle (B-D ULTRAFINE III SHORT PEN) 31G X 8 MM MISC Use as directed       . ONETOUCH DELICA LANCETS MISC Use as directed four times daily dx 250.01  100 each  5  . simvastatin (ZOCOR) 40 MG tablet TAKE 1 TABLET BY MOUTH AT BEDTIME  90 tablet  1    Allergies  Allergen Reactions  . Iohexol      Code: HIVES, Desc: PATIENT STATES HE IS ALLERGIC TO IV  DYE, HIVES, RASH. 12/28/05 /RM     Family History  Problem Relation Age of Onset  . Stroke Father     strong FH cerebrovascular disease  . Diabetes Brother   . Heart disease Brother     CHF  . Colon cancer Neg Hx     BP 102/68  Pulse 72  Temp(Src) 97.5 F (36.4 C) (Oral)  Ht 5\' 9"  (1.753 m)  Wt 182 lb (82.555 kg)  BMI 26.88 kg/m2  SpO2 95%  Review of Systems Pt reports 10 lb weight gain.      Objective:   Physical Exam VITAL SIGNS:  See vs page GENERAL: no distress. Psych: distracted.    Lab Results  Component Value Date   HGBA1C 8.0* 04/01/2011      Assessment & Plan:  Dm.  He is having some functional problems, which complicate the care of his diabetes.  Furthermore, he is receiving diabetes management from multiple individuals.  The recommendations he is receiving are much too complex for him.  This patient needs to set achievable goals for glycemic control, and the simplest possible insulin schedule.

## 2011-05-25 ENCOUNTER — Ambulatory Visit: Payer: 59 | Admitting: Endocrinology

## 2011-05-31 ENCOUNTER — Telehealth: Payer: Self-pay

## 2011-05-31 MED ORDER — CITALOPRAM HYDROBROMIDE 20 MG PO TABS: 40.0000 mg | ORAL_TABLET | ORAL | Status: DC

## 2011-05-31 NOTE — Telephone Encounter (Signed)
Sorry, we can't replace the rx

## 2011-05-31 NOTE — Telephone Encounter (Signed)
Pt's spouse called requesting refill of Xanax. After being advised that pt received hardcopy at last Ov, spouse states that pt did not received Rx for Xanax 1 mg #90 x 1. Pt is requesting Rx for this medication. Control substance registry report printed for MD's review.

## 2011-05-31 NOTE — Telephone Encounter (Signed)
Pt's spouse advised and states she will have pt look through paperwork given at last OV

## 2011-06-18 ENCOUNTER — Encounter (HOSPITAL_COMMUNITY): Payer: Self-pay

## 2011-06-18 ENCOUNTER — Emergency Department (HOSPITAL_COMMUNITY)
Admission: EM | Admit: 2011-06-18 | Discharge: 2011-06-18 | Disposition: A | Discharge status: Home / Self Care | Payer: 59 | Source: Home / Self Care | Attending: Emergency Medicine | Admitting: Emergency Medicine

## 2011-06-18 DIAGNOSIS — J45909 Unspecified asthma, uncomplicated: Secondary | ICD-10-CM

## 2011-06-18 MED ORDER — ALBUTEROL SULFATE (5 MG/ML) 0.5% IN NEBU
INHALATION_SOLUTION | RESPIRATORY_TRACT | Status: AC
  Filled 2011-06-18: qty 1

## 2011-06-18 MED ORDER — ALBUTEROL SULFATE (5 MG/ML) 0.5% IN NEBU
5.0000 mg | INHALATION_SOLUTION | Freq: Once | RESPIRATORY_TRACT | Status: AC
  Administered 2011-06-18: 5 mg via RESPIRATORY_TRACT

## 2011-06-18 MED ORDER — IPRATROPIUM BROMIDE 0.02 % IN SOLN
0.5000 mg | Freq: Once | RESPIRATORY_TRACT | Status: AC
  Administered 2011-06-18: 0.5 mg via RESPIRATORY_TRACT

## 2011-06-18 MED ORDER — AZITHROMYCIN 250 MG PO TABS: ORAL_TABLET | ORAL | Status: AC

## 2011-06-18 MED ORDER — BENZONATATE 200 MG PO CAPS: 200.0000 mg | ORAL_CAPSULE | Freq: Three times a day (TID) | ORAL | Status: AC | PRN

## 2011-06-18 MED ORDER — BECLOMETHASONE DIPROPIONATE 80 MCG/ACT IN AERS: 2.0000 | INHALATION_SPRAY | Freq: Two times a day (BID) | RESPIRATORY_TRACT | Status: DC

## 2011-06-18 NOTE — ED Provider Notes (Signed)
History     CSN: [id number] Arrival date & time: 06/18/2011  3:54 PM   First MD Initiated Contact with Patient 06/18/11 1610      Chief Complaint  Patient presents with  . Wheezing  . Cough    (Consider location/radiation/quality/duration/timing/severity/associated sxs/prior treatment) HPI Comments: James [name] has a one-week history of postnasal drip, headache, sore throat, nasal congestion, rhinorrhea, and then yesterday developed wheezing, shortness of breath, chest pain, cough productive of yellow-brown sputum, and sweats. He has a history of asthma and is using a Ventolin inhaler. He also has diabetes which is controlled with Lantus and Humalog. Right now he's seeing Dr. Romero Belling, but plans to switch to Dr. Ardyth Harps.  Patient is a 55 y.o. male presenting with wheezing and cough.  Wheezing  Associated symptoms include rhinorrhea, sore throat, cough and wheezing. Pertinent negatives include no fever and no shortness of breath.  Cough Associated symptoms include headaches, rhinorrhea, sore throat and wheezing. Pertinent negatives include no chills, no ear pain, no shortness of breath and no eye redness.    Past Medical History  Diagnosis Date  . DIABETES MELLITUS, TYPE I 04/03/2007  . 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 DIABETIC RETINOPATHY, PROLIFERATIVE 04/03/2007  . DM W/EYE MANIFESTATIONS, TYPE I, UNCONTROLLED 04/04/2007  . HYPERLIPIDEMIA 04/04/2007  . ANXIETY 04/03/2007  . DEPRESSION 04/03/2007  . CARPAL TUNNEL SYNDROME, BILATERAL 07/31/2007  . CORONARY ARTERY DISEASE 04/03/2007  . ASTHMATIC BRONCHITIS, ACUTE 10/25/2008  . ASTHMA 09/06/2008  . RENAL INSUFFICIENCY 07/31/2007  . CHEST PAIN 10/25/2008  . Renal insufficiency   . ED (erectile dysfunction)   . Cervical disc disease   . Bladder neck obstruction     Past Surgical History  Procedure Date  . Ptca     stent  . Spine surgery     s/p C-spine disc surgery-?fusion  . Appendectomy   . Tonsillectomy   .  Stress cardiolite 09/06/2002    Family History  Problem Relation Age of Onset  . Stroke Father     strong FH cerebrovascular disease  . Diabetes Brother   . Heart disease Brother     CHF  . Colon cancer Neg Hx     History  Substance Use Topics  . Smoking status: Former Games developer  . Smokeless tobacco: Former Neurosurgeon    Quit date: 02/16/2010  . Alcohol Use: No      Review of Systems  Constitutional: Negative for fever, chills and fatigue.  HENT: Positive for congestion, sore throat and rhinorrhea. Negative for ear pain, sneezing, neck stiffness, voice change and postnasal drip.   Eyes: Negative for pain, discharge and redness.  Respiratory: Positive for cough and wheezing. Negative for chest tightness and shortness of breath.   Gastrointestinal: Negative for nausea, vomiting, abdominal pain and diarrhea.  Skin: Negative for rash.  Neurological: Positive for headaches.    Allergies  Iohexol  Home Medications   Current Outpatient Rx  Name Route Sig Dispense Refill  . VENTOLIN HFA IN Inhalation Inhale into the lungs as needed.      . INSULIN GLARGINE 100 UNIT/ML [place] SOLN Subcutaneous Inject 30 Units into the skin at bedtime.      [name] HUMALOG [place] Subcutaneous Inject into the skin as needed.      . ALCOHOL PREPS PADS  4/[place]     . ALPRAZOLAM 2 MG PO TABS Oral Take 1 tablet (2 mg total) by mouth at bedtime as needed for sleep. 90 tablet 1  . AMOXICILLIN  875 MG PO TABS Oral Take 875 mg by mouth 2 (two) times daily.      . AZITHROMYCIN 250 MG PO TABS  Take as directed. 6 tablet 0  . BECLOMETHASONE DIPROPIONATE 80 MCG/ACT IN AERS Inhalation Inhale 2 puffs into the lungs 2 (two) times daily. 1 Inhaler 0  . BENZONATATE 200 MG PO CAPS Oral Take 1 capsule (200 mg total) by mouth 3 (three) times daily as needed for cough. 30 capsule 0  . ONETOUCH ULTRA 2 W/DEVICE KIT Does not apply 1 Device by Does not apply route once. 1 each 0  . CITALOPRAM HYDROBROMIDE 20 MG PO TABS Oral Take 2 tablets (40  mg total) by mouth every morning. 2 tabs each morning 180 tablet 1  . ESOMEPRAZOLE MAGNESIUM 40 MG PO CPDR Oral Take 1 capsule (40 mg total) by mouth daily before breakfast. 90 capsule 1  . GLUCOSE BLOOD VI STRP  Use 6 times daily to test blood glucose dx 250.01     . IBUPROFEN 200 MG PO TABS Oral Take 200 mg by mouth every 6 (six) hours as needed.      . INSULIN ASPART PROT & ASPART (70-30) 100 UNIT/ML [place] SUSP Subcutaneous Inject 25 Units into the skin 2 (two) times daily with a meal. And pen needles 2/[place]. 15 mL 12  . INSULIN PEN NEEDLE 31G X 8 MM MISC  Use as directed     . ONETOUCH DELICA LANCETS MISC  Use as directed four times daily dx 250.01 100 each 5  . OXYCODONE-ACETAMINOPHEN 5-325 MG PO TABS Oral Take 1 tablet by mouth as needed.      [name] SIMVASTATIN 40 MG PO TABS  TAKE 1 TABLET BY MOUTH AT BEDTIME 90 tablet 1    BP 110/76  Pulse 94  Temp(Src) 98.1 F (36.7 C) (Oral)  Resp 16  SpO2 95%  Physical Exam  Nursing note and vitals reviewed. Constitutional: He appears well-developed and well-nourished. No distress.  HENT:  Head: Normocephalic and atraumatic.  Right Ear: External ear normal.  Left Ear: External ear normal.  Nose: Nose normal.  Mouth/Throat: Oropharynx is clear and moist. No oropharyngeal exudate.  Eyes: Conjunctivae and EOM are normal. Pupils are equal, round, and reactive to light. Right eye exhibits no discharge. Left eye exhibits no discharge.  Neck: Normal range of motion. Neck supple.  Cardiovascular: Normal rate, regular rhythm and normal heart sounds.   Pulmonary/Chest: Effort normal. No stridor. No respiratory distress. He has wheezes (he has bilateral inspiratory and expiratory wheezes). He has no rales. He exhibits no tenderness.  Lymphadenopathy:    He has no cervical adenopathy.  Skin: Skin is warm and dry. No rash noted. He is not diaphoretic.    ED Course  Procedures (including critical care time)  The patient was given the following meds in the  [facility]:   Medications  Insulin Lispro, Human, (HUMALOG [place]) (not administered)  insulin glargine (LANTUS) 100 UNIT/ML injection (not administered)  Albuterol Sulfate (VENTOLIN HFA IN) (not administered)  azithromycin (ZITHROMAX Z-PAK) 250 MG tablet (not administered)  beclomethasone (QVAR) 80 MCG/ACT inhaler (not administered)  benzonatate (TESSALON) 200 MG capsule (not administered)  ipratropium (ATROVENT) nebulizer solution 0.5 mg (0.5 mg Nebulization Given 06/18/11 1711)    And  albuterol (PROVENTIL) (5 MG/ML) 0.5% nebulizer solution 5 mg (5 mg Nebulization Given 06/18/11 1711)     And had the following response:  After a DuoNeb inhaler he felt a little bit better, but still had bilateral inspiratory  and expiratory wheezes.  Labs Reviewed - No data to display No results found.   1. Asthmatic bronchitis       MDM  He has asthmatic bronchitis. I elected to give him steroids because of his history of diabetes. He was given Qvar to use along with antibiotic and cough medicine.        Roque Lias, MD 06/18/11 [phone]

## 2011-06-18 NOTE — ED Notes (Signed)
C/o productive cough of yellowish brown sputum, wheezing, SOB for 5-6 days.  Denies known fever, reports using ventolin inhaler with some improvement.  States he was on advair but stopped using it 5 months ago.

## 2011-06-25 DIAGNOSIS — I739 Peripheral vascular disease, unspecified: Secondary | ICD-10-CM | POA: Insufficient documentation

## 2011-08-18 ENCOUNTER — Ambulatory Visit (INDEPENDENT_AMBULATORY_CARE_PROVIDER_SITE_OTHER): Payer: 59 | Admitting: Ophthalmology

## 2011-08-23 ENCOUNTER — Ambulatory Visit (INDEPENDENT_AMBULATORY_CARE_PROVIDER_SITE_OTHER): Payer: 59 | Admitting: Ophthalmology

## 2011-08-23 DIAGNOSIS — H43819 Vitreous degeneration, unspecified eye: Secondary | ICD-10-CM

## 2011-08-23 DIAGNOSIS — H2510 Age-related nuclear cataract, unspecified eye: Secondary | ICD-10-CM

## 2011-08-23 DIAGNOSIS — E1065 Type 1 diabetes mellitus with hyperglycemia: Secondary | ICD-10-CM

## 2011-08-23 DIAGNOSIS — E1039 Type 1 diabetes mellitus with other diabetic ophthalmic complication: Secondary | ICD-10-CM

## 2011-08-23 DIAGNOSIS — E11359 Type 2 diabetes mellitus with proliferative diabetic retinopathy without macular edema: Secondary | ICD-10-CM

## 2011-08-23 DIAGNOSIS — H26499 Other secondary cataract, unspecified eye: Secondary | ICD-10-CM

## 2011-08-30 ENCOUNTER — Ambulatory Visit (INDEPENDENT_AMBULATORY_CARE_PROVIDER_SITE_OTHER): Payer: 59 | Admitting: Ophthalmology

## 2011-08-30 DIAGNOSIS — H2700 Aphakia, unspecified eye: Secondary | ICD-10-CM

## 2011-10-19 ENCOUNTER — Emergency Department (HOSPITAL_BASED_OUTPATIENT_CLINIC_OR_DEPARTMENT_OTHER)
Admission: EM | Admit: 2011-10-19 | Discharge: 2011-10-19 | Disposition: A | Discharge status: Home / Self Care | Payer: 59 | Attending: Emergency Medicine | Admitting: Emergency Medicine

## 2011-10-19 ENCOUNTER — Emergency Department (INDEPENDENT_AMBULATORY_CARE_PROVIDER_SITE_OTHER): Payer: 59

## 2011-10-19 ENCOUNTER — Encounter (HOSPITAL_BASED_OUTPATIENT_CLINIC_OR_DEPARTMENT_OTHER): Payer: Self-pay | Admitting: *Deleted

## 2011-10-19 DIAGNOSIS — IMO0002 Reserved for concepts with insufficient information to code with codable children: Secondary | ICD-10-CM

## 2011-10-19 DIAGNOSIS — I6529 Occlusion and stenosis of unspecified carotid artery: Secondary | ICD-10-CM

## 2011-10-19 DIAGNOSIS — F341 Dysthymic disorder: Secondary | ICD-10-CM | POA: Insufficient documentation

## 2011-10-19 DIAGNOSIS — Z794 Long term (current) use of insulin: Secondary | ICD-10-CM | POA: Insufficient documentation

## 2011-10-19 DIAGNOSIS — F29 Unspecified psychosis not due to a substance or known physiological condition: Secondary | ICD-10-CM

## 2011-10-19 DIAGNOSIS — M546 Pain in thoracic spine: Secondary | ICD-10-CM | POA: Insufficient documentation

## 2011-10-19 DIAGNOSIS — W19XXXA Unspecified fall, initial encounter: Secondary | ICD-10-CM

## 2011-10-19 DIAGNOSIS — I2510 Atherosclerotic heart disease of native coronary artery without angina pectoris: Secondary | ICD-10-CM | POA: Insufficient documentation

## 2011-10-19 DIAGNOSIS — H538 Other visual disturbances: Secondary | ICD-10-CM

## 2011-10-19 DIAGNOSIS — W1809XA Striking against other object with subsequent fall, initial encounter: Secondary | ICD-10-CM | POA: Insufficient documentation

## 2011-10-19 DIAGNOSIS — Z79899 Other long term (current) drug therapy: Secondary | ICD-10-CM | POA: Insufficient documentation

## 2011-10-19 DIAGNOSIS — E785 Hyperlipidemia, unspecified: Secondary | ICD-10-CM | POA: Insufficient documentation

## 2011-10-19 DIAGNOSIS — M549 Dorsalgia, unspecified: Secondary | ICD-10-CM

## 2011-10-19 DIAGNOSIS — Z981 Arthrodesis status: Secondary | ICD-10-CM

## 2011-10-19 DIAGNOSIS — J45909 Unspecified asthma, uncomplicated: Secondary | ICD-10-CM | POA: Insufficient documentation

## 2011-10-19 DIAGNOSIS — E109 Type 1 diabetes mellitus without complications: Secondary | ICD-10-CM | POA: Insufficient documentation

## 2011-10-19 DIAGNOSIS — S0990XA Unspecified injury of head, initial encounter: Secondary | ICD-10-CM | POA: Insufficient documentation

## 2011-10-19 MED ORDER — OXYCODONE-ACETAMINOPHEN 5-325 MG PO TABS: 2.0000 | ORAL_TABLET | ORAL | Status: AC | PRN

## 2011-10-19 MED ORDER — HYDROCODONE-ACETAMINOPHEN 5-325 MG PO TABS
1.0000 | ORAL_TABLET | Freq: Once | ORAL | Status: DC
  Filled 2011-10-19: qty 1

## 2011-10-19 MED ORDER — OXYCODONE-ACETAMINOPHEN 5-325 MG PO TABS
1.0000 | ORAL_TABLET | Freq: Once | ORAL | Status: AC
  Administered 2011-10-19: 1 via ORAL
  Filled 2011-10-19: qty 1

## 2011-10-19 NOTE — ED Provider Notes (Signed)
Medical screening examination/treatment/procedure(s) were performed by non-physician practitioner and as supervising physician I was immediately available for consultation/collaboration.   Treylin Burtch, MD 10/19/11 1754 

## 2011-10-19 NOTE — Discharge Instructions (Signed)
Chronic Back Pain When back pain lasts longer than 3 months, it is called chronic back pain.This pain can be frustrating, but the cause of the pain is rarely dangerous.People with chronic back pain often go through certain periods that are more intense (flare-ups). CAUSES Chronic back pain can be caused by wear and tear (degeneration) on different structures in your back. These structures may include bones, ligaments, or discs. This degeneration may result in more pressure being placed on the nerves that travel to your legs and feet. This can lead to pain traveling from the low back down the back of the legs. When pain lasts longer than 3 months, it is not unusual for people to experience anxiety or depression. Anxiety and depression can also contribute to low back pain. TREATMENT  Establish a regular exercise plan. This is critical to improving your functional level.   Have a self-management plan for when you flare-up. Flare-ups rarely require a medical visit. Regular exercise will help reduce the intensity and frequency of your flare-ups.   Manage how you feel about your back pain and the rest of your life. Anxiety, depression, and feeling that you cannot alter your back pain have been shown to make back pain more intense and debilitating.   Medicines should never be your only treatment. They should be used along with other treatments to help you return to a more active lifestyle.   Procedures such as injections or surgery may be helpful but are rarely necessary. You may be able to get the same results with physical therapy or chiropractic care.  HOME CARE INSTRUCTIONS  Avoid bending, heavy lifting, prolonged sitting, and activities which make the problem worse.   Continue normal activity as much as possible.   Take brief periods of rest throughout the [place] to reduce your pain during flare-ups.   Follow your back exercise rehabilitation program. This can help reduce symptoms and prevent  more pain.   Only take over-the-counter or prescription medicines as directed by your caregiver. Muscle relaxants are sometimes prescribed. Narcotic pain medicine is discouraged for long-term pain, since addiction is a possible outcome.   If you smoke, quit.   Eat healthy foods and maintain a recommended body weight.  SEEK IMMEDIATE MEDICAL CARE IF:   You have weakness or numbness in one of your legs or feet.   You have trouble controlling your bladder or bowels.   You develop nausea, vomiting, abdominal pain, shortness of breath, or fainting.  Document Released: 08/12/2004 Document Revised: 06/24/2011 Document Reviewed: 06/19/2011 [facility] Patient Information 2012 [place], [place].Head Injury, Adult You have had a head injury that does not appear serious at this time. A concussion is a state of changed mental ability, usually from a blow to the head. You should take clear liquids for the rest of the [place] and then resume your regular diet. You should not take sedatives or alcoholic beverages for as long as directed by your caregiver after discharge. After injuries such as yours, most problems occur within the first 24 hours. SYMPTOMS These minor symptoms may be experienced after discharge:  Memory difficulties.   Dizziness.   Headaches.   Double vision.   Hearing difficulties.   Depression.   Tiredness.   Weakness.   Difficulty with concentration.  If you experience any of these problems, you should not be alarmed. A concussion requires a few days for recovery. Many patients with head injuries frequently experience such symptoms. Usually, these problems disappear without medical care. If symptoms last for  more than one [place], notify your caregiver. See your caregiver sooner if symptoms are becoming worse rather than better. HOME CARE INSTRUCTIONS   During the next 24 hours you must stay with someone who can watch you for the warning signs listed below.  Although it is unlikely  that serious side effects will occur, you should be aware of signs and symptoms which may necessitate your return to this location. Side effects may occur up to 7 - 10 days following the injury. It is important for you to carefully monitor your condition and contact your caregiver or seek immediate medical attention if there is a change in your condition. SEEK IMMEDIATE MEDICAL CARE IF:   There is confusion or drowsiness.   You can not awaken the injured person.   There is nausea (feeling sick to your stomach) or continued, forceful vomiting.   You notice dizziness or unsteadiness which is getting worse, or inability to walk.   You have convulsions or unconsciousness.   You experience severe, persistent headaches not relieved by over-the-counter or prescription medicines for pain. (Do not take aspirin as this impairs clotting abilities). Take other pain medications only as directed.   You can not use arms or legs normally.   There is clear or bloody discharge from the nose or ears.  MAKE SURE YOU:   Understand these instructions.   Will watch your condition.   Will get help right away if you are not doing well or get worse.  Document Released: 07/05/2005 Document Revised: 06/24/2011 Document Reviewed: 05/23/2009 [facility] Patient Information 2012 [place], [place].

## 2011-10-19 NOTE — ED Provider Notes (Signed)
History     CSN: [id number]  Arrival date & time 10/19/11  1539   First MD Initiated Contact with Patient 10/19/11 1540      Chief Complaint  Patient presents with  . Fall    (Consider location/radiation/quality/duration/timing/severity/associated sxs/prior treatment) HPI Comments: Pt states that he was lifting a heavy piece of equipment on to a pick up and it fell knocking him over:pt states that he hit the back of his head and his back:pt denies loc, n/v, or visual change:pt states that he is having back pain:tp state that he has a history of low back pain, but this is a  little higher then normal  Patient is a 56 y.o. male presenting with fall. The history is provided by the patient. No language interpreter was used.  Fall The accident occurred 1 to 2 hours ago. The fall occurred while standing. He landed on concrete. The point of impact was the head. Pain location: back. The pain is moderate. He was ambulatory at the scene. There was no entrapment after the fall. There was no drug use involved in the accident. There was no alcohol use involved in the accident. Pertinent negatives include no fever, no numbness, no bowel incontinence, no vomiting, no loss of consciousness and no tingling. He has tried nothing for the symptoms.    Past Medical History  Diagnosis Date  . DIABETES MELLITUS, TYPE I 04/03/2007  . 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 DIABETIC RETINOPATHY, PROLIFERATIVE 04/03/2007  . DM W/EYE MANIFESTATIONS, TYPE I, UNCONTROLLED 04/04/2007  . HYPERLIPIDEMIA 04/04/2007  . ANXIETY 04/03/2007  . DEPRESSION 04/03/2007  . CARPAL TUNNEL SYNDROME, BILATERAL 07/31/2007  . CORONARY ARTERY DISEASE 04/03/2007  . ASTHMATIC BRONCHITIS, ACUTE 10/25/2008  . ASTHMA 09/06/2008  . RENAL INSUFFICIENCY 07/31/2007  . CHEST PAIN 10/25/2008  . Renal insufficiency   . ED (erectile dysfunction)   . Cervical disc disease   . Bladder neck obstruction     Past Surgical History  Procedure  Date  . Ptca     stent  . Spine surgery     s/p C-spine disc surgery-?fusion  . Appendectomy   . Tonsillectomy   . Stress cardiolite 09/06/2002    Family History  Problem Relation Age of Onset  . Stroke Father     strong FH cerebrovascular disease  . Diabetes Brother   . Heart disease Brother     CHF  . Colon cancer Neg Hx     History  Substance Use Topics  . Smoking status: Former Games developer  . Smokeless tobacco: Former Neurosurgeon    Quit date: 02/16/2010  . Alcohol Use: No      Review of Systems  Constitutional: Negative for fever.  HENT: Negative.   Eyes: Negative.   Respiratory: Negative.   Cardiovascular: Negative.   Gastrointestinal: Negative.  Negative for vomiting and bowel incontinence.  Musculoskeletal: Positive for back pain.  Neurological: Negative for tingling, loss of consciousness and numbness.    Allergies  Iohexol  Home Medications   Current Outpatient Rx  Name Route Sig Dispense Refill  . VENTOLIN HFA IN Inhalation Inhale into the lungs as needed.      . ALCOHOL PREPS PADS  4/[place]     . ALPRAZOLAM 2 MG PO TABS Oral Take 1 tablet (2 mg total) by mouth at bedtime as needed for sleep. 90 tablet 1  . AMOXICILLIN 875 MG PO TABS Oral Take 875 mg by mouth 2 (two) times daily.      [name]  BECLOMETHASONE DIPROPIONATE 80 MCG/ACT IN AERS Inhalation Inhale 2 puffs into the lungs 2 (two) times daily. 1 Inhaler 0  . ONETOUCH ULTRA 2 W/DEVICE KIT Does not apply 1 Device by Does not apply route once. 1 each 0  . CITALOPRAM HYDROBROMIDE 20 MG PO TABS Oral Take 2 tablets (40 mg total) by mouth every morning. 2 tabs each morning 180 tablet 1  . ESOMEPRAZOLE MAGNESIUM 40 MG PO CPDR Oral Take 1 capsule (40 mg total) by mouth daily before breakfast. 90 capsule 1  . GLUCOSE BLOOD VI STRP  Use 6 times daily to test blood glucose dx 250.01     . IBUPROFEN 200 MG PO TABS Oral Take 200 mg by mouth every 6 (six) hours as needed.      . INSULIN ASPART PROT & ASPART (70-30) 100 UNIT/ML  [place] SUSP Subcutaneous Inject 25 Units into the skin 2 (two) times daily with a meal. And pen needles 2/Canner. 15 mL 12  . INSULIN GLARGINE 100 UNIT/ML [place] SOLN Subcutaneous Inject 30 Units into the skin at bedtime.      [name] HUMALOG [place] Subcutaneous Inject into the skin as needed.      . INSULIN PEN NEEDLE 31G X 8 MM MISC  Use as directed     . ONETOUCH DELICA LANCETS MISC  Use as directed four times daily dx 250.01 100 each 5  . OXYCODONE-ACETAMINOPHEN 5-325 MG PO TABS Oral Take 1 tablet by mouth as needed.      [name] SIMVASTATIN 40 MG PO TABS  TAKE 1 TABLET BY MOUTH AT BEDTIME 90 tablet 1    BP 142/77  Pulse 84  Temp(Src) 97.7 F (36.5 C) (Oral)  Resp 20  SpO2 100%  Physical Exam  Nursing note and vitals reviewed. Constitutional: He is oriented to person, place, and time. He appears well-developed and well-nourished.  HENT:  Head: Normocephalic and atraumatic.  Right Ear: External ear normal.  Left Ear: External ear normal.       No sign of injury noted to the scalp  Eyes: Conjunctivae and EOM are normal. Pupils are equal, round, and reactive to light.  Neck: Normal range of motion. Neck supple.  Cardiovascular: Normal rate and regular rhythm.   Pulmonary/Chest: Breath sounds normal.  Musculoskeletal: Normal range of motion.       Cervical back: Normal.       Thoracic back: He exhibits tenderness and bony tenderness.       Lumbar back: He exhibits normal range of motion, no tenderness and no bony tenderness.  Neurological: He is alert and oriented to person, place, and time.  Skin: Skin is warm and dry.  Psychiatric: He has a normal mood and affect.    ED Course  Procedures (including critical care time)  Labs Reviewed - No data to display Dg Thoracic Spine 2 View  10/19/2011  *RADIOLOGY REPORT*  Clinical Data: Fall.  Thoracic back pain.  THORACIC SPINE - 2 VIEW  Comparison: Two-view chest x-ray 02/14/2009.  Findings: The patient is status post ACDF at C6-7.  12 rib-bearing thoracic  type vertebral bodies are present.  The vertebral body heights and alignment are maintained.  The fusion at C6-7 appears mature. The medial ribs are intact.  The visualized lungs are clear.  IMPRESSION:  1.  No acute abnormality of the thoracic spine. 2.  Status post fusion at C6-7.  Original Report Authenticated By: Jamesetta Orleans. MATTERN, M.D.   Dg Lumbar Spine Complete  10/19/2011  *RADIOLOGY REPORT*  Clinical Data: Larey Seat.  Injured back.  LUMBAR SPINE - COMPLETE 4+ VIEW  Comparison: None  Findings: The lateral film demonstrates normal alignment. Vertebral bodies and disc spaces are maintained.  No acute bony findings.  Normal alignment of the facet joints and no pars defects.  The visualized bony pelvis in intact. Vascular calcifications are noted.  IMPRESSION: Normal alignment and no acute bony findings.  Original Report Authenticated By: P. Loralie Champagne, M.D.   Ct Head Wo Contrast  10/19/2011  *RADIOLOGY REPORT*  Clinical Data: Fall.  Blurred vision and confusion.  CT HEAD WITHOUT CONTRAST  Technique:  Contiguous axial images were obtained from the base of the skull through the vertex without contrast.  Comparison: None.  Findings: No acute cortical infarct, hemorrhage, mass lesion is present.  The ventricles are of normal size.  Minimal periventricular white matter hypoattenuation is evident.  No significant extra-axial fluid collection is present.  Mild mucosal thickening is present in the maxillary sinuses bilaterally.  No focal polyp is noted in the posterior right nasal cavity.  Minimal mucosal thickening is noted in the right sphenoid sinus and scattered throughout the ethmoid air cells. The frontal sinuses are clear.  The mastoid air cells are clear.  Atherosclerotic calcifications are evident within the cavernous carotid arteries.  IMPRESSION:  1.  Mild periventricular white matter hypoattenuation suggests microvascular ischemia. 2.  Atherosclerosis. 3.  No acute intracranial abnormality.  Original  Report Authenticated By: Jamesetta Orleans. MATTERN, M.D.     1. Fall   2. Back pain   3. Head injury       MDM  Pt not having any neuro deficits:pt has no acute findings noted on x-ray:will treat symptomatically with something for pain:pt is okay to follow up with his specialist as needed       Teressa Lower, NP 10/19/11 1741

## 2011-10-19 NOTE — ED Notes (Signed)
Putting a piece of equipment onto a truck and fell off with the piece of equipment.  Struck back of head on concrete but denies injury.  Has back pain but states that is chronic.  Denies headache  Nausea or vomiting and no loss of consciousness

## 2011-11-08 ENCOUNTER — Telehealth: Payer: Self-pay | Admitting: Gastroenterology

## 2011-11-08 NOTE — Telephone Encounter (Signed)
Pts wife states he is still having problems with feeling like food is just sitting in his stomach and with constipation. Asking to be seen sooner than June. Pt scheduled to see Dr. Arlyce Dice 12/03/11@2 :30pm. Wife aware of appt date and time. Gastroparesis diet mailed to pt.

## 2011-12-03 ENCOUNTER — Ambulatory Visit: Payer: 59 | Admitting: Gastroenterology

## 2011-12-31 ENCOUNTER — Ambulatory Visit: Payer: 59 | Admitting: Gastroenterology

## 2012-01-06 ENCOUNTER — Other Ambulatory Visit (HOSPITAL_COMMUNITY): Payer: Self-pay | Admitting: Orthopedic Surgery

## 2012-01-06 DIAGNOSIS — M549 Dorsalgia, unspecified: Secondary | ICD-10-CM

## 2012-01-06 DIAGNOSIS — R200 Anesthesia of skin: Secondary | ICD-10-CM

## 2012-01-10 ENCOUNTER — Other Ambulatory Visit (HOSPITAL_COMMUNITY): Payer: 59

## 2012-01-17 ENCOUNTER — Ambulatory Visit (HOSPITAL_COMMUNITY)
Admission: RE | Admit: 2012-01-17 | Discharge: 2012-01-17 | Disposition: A | Discharge status: Home / Self Care | Payer: 59 | Source: Ambulatory Visit | Attending: Orthopedic Surgery | Admitting: Orthopedic Surgery

## 2012-01-17 DIAGNOSIS — M549 Dorsalgia, unspecified: Secondary | ICD-10-CM | POA: Insufficient documentation

## 2012-01-17 DIAGNOSIS — R209 Unspecified disturbances of skin sensation: Secondary | ICD-10-CM | POA: Insufficient documentation

## 2012-01-17 DIAGNOSIS — M48061 Spinal stenosis, lumbar region without neurogenic claudication: Secondary | ICD-10-CM | POA: Insufficient documentation

## 2012-01-17 DIAGNOSIS — R200 Anesthesia of skin: Secondary | ICD-10-CM

## 2012-01-24 ENCOUNTER — Ambulatory Visit: Payer: 59 | Admitting: Gastroenterology

## 2012-02-19 ENCOUNTER — Emergency Department (HOSPITAL_COMMUNITY)
Admission: EM | Admit: 2012-02-19 | Discharge: 2012-02-19 | Disposition: A | Discharge status: Home / Self Care | Payer: 59 | Source: Home / Self Care | Attending: Emergency Medicine | Admitting: Emergency Medicine

## 2012-02-19 ENCOUNTER — Encounter (HOSPITAL_COMMUNITY): Payer: Self-pay | Admitting: *Deleted

## 2012-02-19 DIAGNOSIS — E860 Dehydration: Secondary | ICD-10-CM

## 2012-02-19 DIAGNOSIS — G40909 Epilepsy, unspecified, not intractable, without status epilepticus: Secondary | ICD-10-CM

## 2012-02-19 LAB — POCT I-STAT, CHEM 8
Calcium, Ion: 1.26 mmol/L — ABNORMAL HIGH (ref 1.12–1.23)
Creatinine, Ser: 1 mg/dL (ref 0.50–1.35)
HCT: 45 % (ref 39.0–52.0)
Hemoglobin: 15.3 g/dL (ref 13.0–17.0)
TCO2: 24 mmol/L (ref 0–100)

## 2012-02-19 LAB — RAPID URINE DRUG SCREEN, HOSP PERFORMED
Amphetamines: NOT DETECTED
Cocaine: NOT DETECTED
Opiates: NOT DETECTED
Tetrahydrocannabinol: NOT DETECTED

## 2012-02-19 LAB — POCT URINALYSIS DIP (DEVICE)
Leukocytes, UA: NEGATIVE
Nitrite: NEGATIVE
Protein, ur: NEGATIVE mg/dL
Urobilinogen, UA: 0.2 mg/dL (ref 0.0–1.0)
pH: 6 (ref 5.0–8.0)

## 2012-02-19 MED ORDER — ALPRAZOLAM 2 MG PO TABS: 2.0000 mg | ORAL_TABLET | Freq: Every evening | ORAL | Status: DC | PRN

## 2012-02-19 NOTE — ED Notes (Signed)
Pt'  Reports he woke up this am at 0200 in a cold sweat and may have had a seizure.   He has had a few diabetic seizures in the past.    Later he checked his CBG and it was 289.  Then at 1115 it was 449 and he took 13 units of Humalog.  He has scratches on the left side of his face and he bit his tongue, he also has pain mid anterior  chest and between the scapulae.  His color is good/skin dry.  He denies nausea.  He did eat a small breakfast.

## 2012-02-19 NOTE — ED Provider Notes (Signed)
History     CSN: [id number]  Arrival date & time 02/19/12  1125   First MD Initiated Contact with Patient 02/19/12 1129      Chief Complaint  Patient presents with  . Seizures    (Consider location/radiation/quality/duration/timing/severity/associated sxs/prior treatment) HPI Comments: Patient presents urgent care this morning describing how around 2 AM this morning he woke up sweaty and with discomfort on the right side of his tongue and face, and sweaty. He describes as well that in the past he has had convulsions or seizures related to low sugars. Describes that after he woke up he immediately took about 2-3 fingers of a COLA, feeling better afterwards. But feeling sore on his face and on his back. He does not recall the seizure activity and is resuming he did as he was by himself and also bit his tongue on the right lateral aspect of it. He also seemed to be feeling weak and fatigue, and has been described that he's been sleep deprived for a while and feeling very tired. While he was waiting to be seen in urgent care this morning he took his sugar and was 455 which she proceeded to use 13 units of rapid insulin. When asked about his glucose after this event took place he replied that he did not check his sugar as he was prior to icing getting his sugar up as he suspected that was the problem. At this point he feels somewhat weak tired and sleepy denies any headache, muscular weakness of any given upper lower extremity. Denies any chest pain at rest or shortness of breath or feeling nauseous, and denies abdominal pain.  Patient is a 56 y.o. male presenting with seizures. The history is provided by the patient and the spouse.  Seizures  This is a new problem. The current episode started 6 to 12 hours ago. The problem has been resolved. There was 1 seizure. Duration: Unknown. Associated symptoms include sleepiness. Pertinent negatives include no headaches, no speech difficulty, no neck stiffness,  no chest pain, no cough, no nausea, no vomiting, no diarrhea and no muscle weakness. Possible causes include sleep deprivation. Hypoglycemia There has been no fever.    Past Medical History  Diagnosis Date  . DIABETES MELLITUS, TYPE I 04/03/2007  . 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 DIABETIC RETINOPATHY, PROLIFERATIVE 04/03/2007  . DM W/EYE MANIFESTATIONS, TYPE I, UNCONTROLLED 04/04/2007  . HYPERLIPIDEMIA 04/04/2007  . ANXIETY 04/03/2007  . DEPRESSION 04/03/2007  . CARPAL TUNNEL SYNDROME, BILATERAL 07/31/2007  . CORONARY ARTERY DISEASE 04/03/2007  . ASTHMATIC BRONCHITIS, ACUTE 10/25/2008  . ASTHMA 09/06/2008  . RENAL INSUFFICIENCY 07/31/2007  . CHEST PAIN 10/25/2008  . Renal insufficiency   . ED (erectile dysfunction)   . Cervical disc disease   . Bladder neck obstruction     Past Surgical History  Procedure Date  . Ptca     stent  . Spine surgery     s/p C-spine disc surgery-?fusion  . Appendectomy   . Tonsillectomy   . Stress cardiolite 09/06/2002    Family History  Problem Relation Age of Onset  . Stroke Father     strong FH cerebrovascular disease  . Diabetes Brother   . Heart disease Brother     CHF  . Colon cancer Neg Hx     History  Substance Use Topics  . Smoking status: Former Games developer  . Smokeless tobacco: Former Neurosurgeon    Quit date: 02/16/2010  . Alcohol Use: No  Review of Systems  Constitutional: Positive for activity change, appetite change and fatigue. Negative for fever and unexpected weight change.  HENT: Negative for neck pain and neck stiffness.   Respiratory: Negative for cough, chest tightness and shortness of breath.   Cardiovascular: Negative for chest pain, palpitations and leg swelling.  Gastrointestinal: Negative for nausea, vomiting and diarrhea.  Neurological: Positive for seizures and weakness. Negative for dizziness, speech difficulty, numbness and headaches.    Allergies  Iohexol  Home Medications   Current  Outpatient Rx  Name Route Sig Dispense Refill  . VENTOLIN HFA IN Inhalation Inhale into the lungs as needed.      . ALCOHOL PREPS PADS  4/[place]     . AMOXICILLIN 875 MG PO TABS Oral Take 875 mg by mouth 2 (two) times daily.      Letta Pate ULTRA 2 W/DEVICE KIT Does not apply 1 Device by Does not apply route once. 1 each 0  . CITALOPRAM HYDROBROMIDE 20 MG PO TABS Oral Take 2 tablets (40 mg total) by mouth every morning. 2 tabs each morning 180 tablet 1  . GLUCOSE BLOOD VI STRP  Use 6 times daily to test blood glucose dx 250.01     . HYDROCODONE-ACETAMINOPHEN 5-500 MG PO TABS Oral Take 1 tablet by mouth every 6 (six) hours as needed.    . IBUPROFEN 200 MG PO TABS Oral Take 200 mg by mouth every 6 (six) hours as needed.      . INSULIN ASPART PROT & ASPART (70-30) 100 UNIT/ML [place] SUSP Subcutaneous Inject 25 Units into the skin 2 (two) times daily with a meal. And pen needles 2/Surette. 15 mL 12  . INSULIN GLARGINE 100 UNIT/ML [place] SOLN Subcutaneous Inject 30 Units into the skin at bedtime.      [name] HUMALOG [place] Subcutaneous Inject into the skin as needed.      . INSULIN PEN NEEDLE 31G X 8 MM MISC  Use as directed     . ONETOUCH DELICA LANCETS MISC  Use as directed four times daily dx 250.01 100 each 5  . OXYCODONE-ACETAMINOPHEN 5-325 MG PO TABS Oral Take 1 tablet by mouth as needed.      [name] PREDNISONE 5 MG PO TABS Oral Take 5 mg by mouth daily.    [name] SIMVASTATIN 40 MG PO TABS  TAKE 1 TABLET BY MOUTH AT BEDTIME 90 tablet 1  . VARENICLINE TARTRATE 0.5 MG PO TABS Oral Take 0.5 mg by mouth 2 (two) times daily.    [name] ALPRAZOLAM 2 MG PO TABS Oral Take 1 tablet (2 mg total) by mouth at bedtime as needed for sleep. 2 tablet 1    BP 138/83  Pulse 86  Temp 98.2 F (36.8 C) (Oral)  Resp 20  SpO2 97%  Physical Exam  Nursing note and vitals reviewed. Constitutional: He is oriented to person, place, and time. Vital signs are normal. He appears well-developed and well-nourished. He is active.  Non-toxic appearance. He  does not have a sickly appearance. He does not appear ill. No distress.  HENT:  Head:    Mouth/Throat: Uvula is midline and mucous membranes are normal. No posterior oropharyngeal edema or posterior oropharyngeal erythema.    Eyes: Conjunctivae are normal. Pupils are equal, round, and reactive to light.  Neck: Neck supple. No JVD present.  Cardiovascular: Normal rate, regular rhythm and intact distal pulses.  Exam reveals no gallop and no friction rub.   No murmur heard. Pulmonary/Chest: Effort normal and breath  sounds normal. No respiratory distress. He has no decreased breath sounds.    Musculoskeletal: Normal range of motion.  Lymphadenopathy:    He has no cervical adenopathy.  Neurological: He is alert and oriented to person, place, and time. He displays no tremor. No cranial nerve deficit or sensory deficit. He exhibits normal muscle tone. Coordination normal. GCS eye subscore is 4. GCS verbal subscore is 5. GCS motor subscore is 6.  Skin: Skin is warm. No rash noted. He is not diaphoretic. No erythema.    ED Course  Procedures (including critical care time)  Labs Reviewed  POCT URINALYSIS DIP (DEVICE) - Abnormal; Notable for the following:    Glucose, UA 500 (*)     Ketones, ur TRACE (*)     All other components within normal limits  POCT I-STAT, CHEM 8 - Abnormal; Notable for the following:    Glucose, Bld 255 (*)     Calcium, Ion 1.26 (*)     All other components within normal limits  URINE RAPID DRUG SCREEN (HOSP PERFORMED)   No results found.   1. Seizure disorder, secondary   2. Dehydration    EKG ventricular rate of 81 beats per minute no PR or QRS abnormality no signs of ischemia or injury normal sinus rhythm. Compared with EKG done November 2001, no significant changes.   MDM  James [name] presents urgent care this morning, with symptomatology and history consistent with a hypoglycemic induced abortion or seizure activity. Patient was hypoglycemic to 255 at  urgent care with no significant electrolyte disbalance of an anion gap of 11. Patient is somewhat feeling globally weak and somewhat fatigue and tired. During my evaluation review of systems and exam denies any chest pain at rest and describes interscapular discomfort only with movement. No shortness of breath. Suspect James [name] although not corroborated might have had a hypoglycemic event we discuss what symptoms should wire and further evaluation in the emergency department such as chest pains, sudden weakness or abnormal sensations in the upper or lower extremities, shortness of breath. He was accompanied by his wife which we'll agreed that if any of the symptoms he would go to the emergency department for further evaluation. I have discussed with them that it's most beneficial to be frequently and in small amounts and to monitor his glucose frequently and to call his endocrinologist Monday.        Jimmie Molly, MD 02/19/12 1323

## 2012-02-28 ENCOUNTER — Ambulatory Visit (INDEPENDENT_AMBULATORY_CARE_PROVIDER_SITE_OTHER): Payer: 59 | Admitting: Ophthalmology

## 2012-02-29 ENCOUNTER — Ambulatory Visit (INDEPENDENT_AMBULATORY_CARE_PROVIDER_SITE_OTHER): Payer: 59 | Admitting: Ophthalmology

## 2012-02-29 DIAGNOSIS — E1065 Type 1 diabetes mellitus with hyperglycemia: Secondary | ICD-10-CM

## 2012-02-29 DIAGNOSIS — H2510 Age-related nuclear cataract, unspecified eye: Secondary | ICD-10-CM

## 2012-02-29 DIAGNOSIS — H43819 Vitreous degeneration, unspecified eye: Secondary | ICD-10-CM

## 2012-02-29 DIAGNOSIS — E1039 Type 1 diabetes mellitus with other diabetic ophthalmic complication: Secondary | ICD-10-CM

## 2012-02-29 DIAGNOSIS — E11359 Type 2 diabetes mellitus with proliferative diabetic retinopathy without macular edema: Secondary | ICD-10-CM

## 2012-08-30 ENCOUNTER — Ambulatory Visit (INDEPENDENT_AMBULATORY_CARE_PROVIDER_SITE_OTHER): Payer: 59 | Admitting: Ophthalmology

## 2012-08-31 ENCOUNTER — Ambulatory Visit (INDEPENDENT_AMBULATORY_CARE_PROVIDER_SITE_OTHER): Payer: 59 | Admitting: Ophthalmology

## 2012-10-30 DIAGNOSIS — G479 Sleep disorder, unspecified: Secondary | ICD-10-CM | POA: Insufficient documentation

## 2012-12-09 ENCOUNTER — Encounter (HOSPITAL_COMMUNITY): Payer: Self-pay | Admitting: *Deleted

## 2012-12-09 ENCOUNTER — Emergency Department (HOSPITAL_COMMUNITY)
Admission: EM | Admit: 2012-12-09 | Discharge: 2012-12-09 | Disposition: A | Discharge status: Home / Self Care | Payer: 59 | Attending: Emergency Medicine | Admitting: Emergency Medicine

## 2012-12-09 ENCOUNTER — Emergency Department (HOSPITAL_COMMUNITY): Payer: 59

## 2012-12-09 DIAGNOSIS — Z8679 Personal history of other diseases of the circulatory system: Secondary | ICD-10-CM | POA: Insufficient documentation

## 2012-12-09 DIAGNOSIS — R059 Cough, unspecified: Secondary | ICD-10-CM | POA: Insufficient documentation

## 2012-12-09 DIAGNOSIS — Z794 Long term (current) use of insulin: Secondary | ICD-10-CM | POA: Insufficient documentation

## 2012-12-09 DIAGNOSIS — F329 Major depressive disorder, single episode, unspecified: Secondary | ICD-10-CM | POA: Insufficient documentation

## 2012-12-09 DIAGNOSIS — Z8739 Personal history of other diseases of the musculoskeletal system and connective tissue: Secondary | ICD-10-CM | POA: Insufficient documentation

## 2012-12-09 DIAGNOSIS — R05 Cough: Secondary | ICD-10-CM | POA: Insufficient documentation

## 2012-12-09 DIAGNOSIS — F17200 Nicotine dependence, unspecified, uncomplicated: Secondary | ICD-10-CM | POA: Insufficient documentation

## 2012-12-09 DIAGNOSIS — E1039 Type 1 diabetes mellitus with other diabetic ophthalmic complication: Secondary | ICD-10-CM | POA: Insufficient documentation

## 2012-12-09 DIAGNOSIS — F411 Generalized anxiety disorder: Secondary | ICD-10-CM | POA: Insufficient documentation

## 2012-12-09 DIAGNOSIS — E11319 Type 2 diabetes mellitus with unspecified diabetic retinopathy without macular edema: Secondary | ICD-10-CM | POA: Insufficient documentation

## 2012-12-09 DIAGNOSIS — J3489 Other specified disorders of nose and nasal sinuses: Secondary | ICD-10-CM | POA: Insufficient documentation

## 2012-12-09 DIAGNOSIS — E1029 Type 1 diabetes mellitus with other diabetic kidney complication: Secondary | ICD-10-CM | POA: Insufficient documentation

## 2012-12-09 DIAGNOSIS — E785 Hyperlipidemia, unspecified: Secondary | ICD-10-CM | POA: Insufficient documentation

## 2012-12-09 DIAGNOSIS — J449 Chronic obstructive pulmonary disease, unspecified: Secondary | ICD-10-CM

## 2012-12-09 DIAGNOSIS — J40 Bronchitis, not specified as acute or chronic: Secondary | ICD-10-CM

## 2012-12-09 DIAGNOSIS — F3289 Other specified depressive episodes: Secondary | ICD-10-CM | POA: Insufficient documentation

## 2012-12-09 DIAGNOSIS — Z8669 Personal history of other diseases of the nervous system and sense organs: Secondary | ICD-10-CM | POA: Insufficient documentation

## 2012-12-09 DIAGNOSIS — J441 Chronic obstructive pulmonary disease with (acute) exacerbation: Secondary | ICD-10-CM | POA: Insufficient documentation

## 2012-12-09 DIAGNOSIS — Z87448 Personal history of other diseases of urinary system: Secondary | ICD-10-CM | POA: Insufficient documentation

## 2012-12-09 DIAGNOSIS — R6889 Other general symptoms and signs: Secondary | ICD-10-CM | POA: Insufficient documentation

## 2012-12-09 DIAGNOSIS — E1065 Type 1 diabetes mellitus with hyperglycemia: Secondary | ICD-10-CM | POA: Insufficient documentation

## 2012-12-09 DIAGNOSIS — I2510 Atherosclerotic heart disease of native coronary artery without angina pectoris: Secondary | ICD-10-CM | POA: Insufficient documentation

## 2012-12-09 DIAGNOSIS — J45901 Unspecified asthma with (acute) exacerbation: Secondary | ICD-10-CM | POA: Insufficient documentation

## 2012-12-09 DIAGNOSIS — Z79899 Other long term (current) drug therapy: Secondary | ICD-10-CM | POA: Insufficient documentation

## 2012-12-09 HISTORY — DX: Anxiety disorder, unspecified: F41.9

## 2012-12-09 HISTORY — DX: Spinal stenosis, site unspecified: M48.00

## 2012-12-09 HISTORY — DX: Depression, unspecified: F32.A

## 2012-12-09 HISTORY — DX: Major depressive disorder, single episode, unspecified: F32.9

## 2012-12-09 HISTORY — DX: Chronic obstructive pulmonary disease, unspecified: J44.9

## 2012-12-09 LAB — CBC
MCH: 31.6 pg (ref 26.0–34.0)
Platelets: 294 10*3/uL (ref 150–400)
RBC: 4.65 MIL/uL (ref 4.22–5.81)
WBC: 8.1 10*3/uL (ref 4.0–10.5)

## 2012-12-09 LAB — BASIC METABOLIC PANEL
Calcium: 9.5 mg/dL (ref 8.4–10.5)
GFR calc Af Amer: >90 mL/min (ref >90)
GFR calc non Af Amer: >90 mL/min (ref >90)
Glucose, Bld: 63 mg/dL — ABNORMAL LOW (ref 70–99)
Sodium: 139 mEq/L (ref 135–145)

## 2012-12-09 LAB — POCT I-STAT TROPONIN I: Troponin i, poc: 0 ng/mL (ref 0.00–0.08)

## 2012-12-09 LAB — HEPATIC FUNCTION PANEL
Albumin: 4 g/dL (ref 3.5–5.2)
Total Bilirubin: 0.6 mg/dL (ref 0.3–1.2)

## 2012-12-09 MED ORDER — PREDNISONE 20 MG PO TABS
60.0000 mg | ORAL_TABLET | Freq: Once | ORAL | Status: AC
  Administered 2012-12-09: 60 mg via ORAL
  Filled 2012-12-09: qty 3

## 2012-12-09 MED ORDER — ALBUTEROL SULFATE (5 MG/ML) 0.5% IN NEBU
5.0000 mg | INHALATION_SOLUTION | Freq: Once | RESPIRATORY_TRACT | Status: AC
  Administered 2012-12-09: 5 mg via RESPIRATORY_TRACT
  Filled 2012-12-09: qty 1

## 2012-12-09 MED ORDER — ALBUTEROL SULFATE HFA 108 (90 BASE) MCG/ACT IN AERS: 2.0000 | INHALATION_SPRAY | Freq: Four times a day (QID) | RESPIRATORY_TRACT | Status: DC | PRN

## 2012-12-09 MED ORDER — ALBUTEROL SULFATE (5 MG/ML) 0.5% IN NEBU
2.5000 mg | INHALATION_SOLUTION | Freq: Once | RESPIRATORY_TRACT | Status: AC
  Administered 2012-12-09: 2.5 mg via RESPIRATORY_TRACT
  Filled 2012-12-09: qty 0.5

## 2012-12-09 MED ORDER — PREDNISONE 20 MG PO TABS: 60.0000 mg | ORAL_TABLET | Freq: Every day | ORAL | Status: DC

## 2012-12-09 NOTE — ED Provider Notes (Signed)
History    CSN: [id number] Arrival date & time 12/09/12  1951 First MD Initiated Contact with Patient 12/09/12 2107      Chief Complaint  Patient presents with  . Shortness of Breath    HPI Patient presents to the emergency room with complaints of shortness of breath. Patient has history of COPD and does continue to smoke. Over the last few days he has noticed increasing cough, shortness of breath and congestion. Patient has noticed that he gets more short of breath if he has to do any activity at all. He has had a clear productive cough. The patient tried using his inhalers but it would only give him Very brief. Past Medical History  Diagnosis Date  . DIABETES MELLITUS, TYPE I 04/03/2007  . 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 DIABETIC RETINOPATHY, PROLIFERATIVE 04/03/2007  . DM W/EYE MANIFESTATIONS, TYPE I, UNCONTROLLED 04/04/2007  . HYPERLIPIDEMIA 04/04/2007  . ANXIETY 04/03/2007  . DEPRESSION 04/03/2007  . CARPAL TUNNEL SYNDROME, BILATERAL 07/31/2007  . CORONARY ARTERY DISEASE 04/03/2007  . ASTHMATIC BRONCHITIS, ACUTE 10/25/2008  . ASTHMA 09/06/2008  . RENAL INSUFFICIENCY 07/31/2007  . CHEST PAIN 10/25/2008  . Renal insufficiency   . ED (erectile dysfunction)   . Cervical disc disease   . Bladder neck obstruction   . COPD (chronic obstructive pulmonary disease)   . Spinal stenosis   . Depression   . Anxiety     Past Surgical History  Procedure Laterality Date  . Ptca      stent  . Spine surgery      s/p C-spine disc surgery-?fusion  . Appendectomy    . Tonsillectomy    . Stress cardiolite  09/06/2002    Family History  Problem Relation Age of Onset  . Stroke Father     strong FH cerebrovascular disease  . Diabetes Brother   . Heart disease Brother     CHF  . Colon cancer Neg Hx     History  Substance Use Topics  . Smoking status: Former Games developer  . Smokeless tobacco: Former Neurosurgeon    Quit date: 02/16/2010  . Alcohol Use: No      Review of Systems   Respiratory: Positive for choking and wheezing.   Skin:       No rash but he has been itching last couple of weeks  All other systems reviewed and are negative.    Allergies  Iohexol  Home Medications   Current Outpatient Rx  Name  Route  Sig  Dispense  Refill  . acetaminophen (TYLENOL) 500 MG tablet   Oral   Take 1,000 mg by mouth every 6 (six) hours as needed for pain.         . DULoxetine (CYMBALTA) 60 MG capsule   Oral   Take 60 mg by mouth daily.         [name] esomeprazole (NEXIUM) 40 MG capsule   Oral   Take 40 mg by mouth daily as needed (for stomach).         [name] ibuprofen (ADVIL,MOTRIN) 200 MG tablet   Oral   Take 200 mg by mouth every 6 (six) hours as needed for pain.          [name] insulin glargine (LANTUS) 100 UNIT/ML injection   Subcutaneous   Inject 44 Units into the skin at bedtime.          . insulin lispro (HUMALOG KWIKPEN) 100 unit/mL SOLN   Subcutaneous   Inject 5-18  Units into the skin as directed. Pt uses per sliding scale and checks blood sugar 4-5 times daily         . simvastatin (ZOCOR) 40 MG tablet   Oral   Take 40 mg by mouth every evening.         . traZODone (DESYREL) 100 MG tablet   Oral   Take 100 mg by mouth at bedtime.         . varenicline (CHANTIX) 1 MG tablet   Oral   Take 1 mg by mouth daily.         [name] albuterol (PROVENTIL HFA;VENTOLIN HFA) 108 (90 BASE) MCG/ACT inhaler   Inhalation   Inhale 2 puffs into the lungs every 6 (six) hours as needed for wheezing or shortness of breath.   1 Inhaler   1   . EXPIRED: esomeprazole (NEXIUM) 40 MG capsule   Oral   Take 1 capsule (40 mg total) by mouth daily before breakfast.   90 capsule   1   . predniSONE (DELTASONE) 20 MG tablet   Oral   Take 3 tablets (60 mg total) by mouth daily.   15 tablet   0     BP 109/64  Pulse 100  Temp(Src) 98.3 F (36.8 C) (Oral)  Resp 14  SpO2 93%  Physical Exam  Nursing note and vitals reviewed. Constitutional: He appears  well-developed and well-nourished. No distress.  HENT:  Head: Normocephalic and atraumatic.  Right Ear: External ear normal.  Left Ear: External ear normal.  Eyes: Conjunctivae are normal. Right eye exhibits no discharge. Left eye exhibits no discharge. No scleral icterus.  Neck: Neck supple. No tracheal deviation present.  Cardiovascular: Normal rate, regular rhythm and intact distal pulses.   Pulmonary/Chest: Effort normal. No stridor. No respiratory distress. He has wheezes. He has no rales.  Abdominal: Soft. Bowel sounds are normal. He exhibits no distension. There is no tenderness. There is no rebound and no guarding.  Musculoskeletal: He exhibits no edema and no tenderness.  Neurological: He is alert. He has normal strength. No sensory deficit. Cranial nerve deficit:  no gross defecits noted. He exhibits normal muscle tone. He displays no seizure activity. Coordination normal.  Skin: Skin is warm and dry. No rash noted.  Psychiatric: He has a normal mood and affect.    ED Course  Procedures (including critical care time)  Rate: 97  Rhythm: normal sinus rhythm  QRS Axis: normal  Intervals: normal  ST/T Wave abnormalities: normal  Conduction Disutrbances:none  Narrative Interpretation:   Old EKG Reviewed: none available Medications  predniSONE (DELTASONE) tablet 60 mg (not administered)  albuterol (PROVENTIL) (5 MG/ML) 0.5% nebulizer solution 5 mg (5 mg Nebulization Given 12/09/12 2107)  albuterol (PROVENTIL) (5 MG/ML) 0.5% nebulizer solution 2.5 mg (2.5 mg Nebulization Given 12/09/12 2153)    Labs Reviewed  BASIC METABOLIC PANEL - Abnormal; Notable for the following:    Glucose, Bld 63 (*)    All other components within normal limits  CBC - Abnormal; Notable for the following:    MCHC 36.2 (*)    All other components within normal limits  HEPATIC FUNCTION PANEL - Abnormal; Notable for the following:    Alkaline Phosphatase 123 (*)    All other components within normal  limits  POCT I-STAT TROPONIN I      Dg Chest 2 View (if Patient Has Fever And/or Copd)  12/09/2012   *RADIOLOGY REPORT*  Clinical Data: Chest pain, shortness of breath, cough  CHEST - 2 VIEW  Comparison: Thoracic spine radiograph 10/19/2011, chest radiograph 04/02/2010  Findings: Cervical fusion hardware noted. Cardiac leads overlie the chest.  Heart size is normal.  Lung volumes are low but clear.  No pleural effusion.  No acute osseous finding.  Remote right posterior eighth rib fracture noted.  IMPRESSION: No acute cardiopulmonary process.   Original Report Authenticated By: Christiana Pellant, M.D.   Labs were reviewed: Patient has a slight elevation of his alkaline phosphatase. Compared against old labs this is not a new finding  1. Bronchitis   2. COPD (chronic obstructive pulmonary disease)       MDM  The patient improved significantly with treatment in the emergency department. He did continue to have some slight wheezing but his oxygenation remained in the mid-90s. He was not having any tachypnea. His breathing was not labored. The patient was given oral prednisone. And albuterol Atrovent treatments. He'll be discharged home on a course of prednisone. I discussed smoking cessation.        Celene Kras, MD 12/09/12 [id number]

## 2012-12-09 NOTE — ED Notes (Signed)
Patient is alert and oriented x3.  He was given DC instructions and follow up visit instructions.  Patient gave verbal understanding.  He was DC ambulatory under his own power to home.  V/S stable.  He was not showing any signs of distress on DC 

## 2012-12-09 NOTE — ED Notes (Signed)
Patient is alert and oriented x3.  He is complaining of shortness of breath that has gotten worse of the last few days.  Increased difficulty breathing with activity.

## 2012-12-09 NOTE — ED Notes (Signed)
Pt states that he has history of COPD and Asthma and that he has been feeling congested for 1 week; pt states that the congestion and feeling short of breath has been progressively getting worse over the last few days; pt states that he has had to stop activities to catch his breath over the last 2 days; pt reports a clear productive cough

## 2012-12-14 ENCOUNTER — Emergency Department (HOSPITAL_COMMUNITY)
Admission: EM | Admit: 2012-12-14 | Discharge: 2012-12-14 | Disposition: A | Discharge status: Home / Self Care | Payer: 59 | Attending: Emergency Medicine | Admitting: Emergency Medicine

## 2012-12-14 ENCOUNTER — Emergency Department (HOSPITAL_COMMUNITY): Payer: 59

## 2012-12-14 ENCOUNTER — Encounter (HOSPITAL_COMMUNITY): Payer: Self-pay

## 2012-12-14 DIAGNOSIS — R110 Nausea: Secondary | ICD-10-CM | POA: Insufficient documentation

## 2012-12-14 DIAGNOSIS — N058 Unspecified nephritic syndrome with other morphologic changes: Secondary | ICD-10-CM | POA: Insufficient documentation

## 2012-12-14 DIAGNOSIS — J4489 Other specified chronic obstructive pulmonary disease: Secondary | ICD-10-CM | POA: Insufficient documentation

## 2012-12-14 DIAGNOSIS — E1029 Type 1 diabetes mellitus with other diabetic kidney complication: Secondary | ICD-10-CM | POA: Insufficient documentation

## 2012-12-14 DIAGNOSIS — F329 Major depressive disorder, single episode, unspecified: Secondary | ICD-10-CM | POA: Insufficient documentation

## 2012-12-14 DIAGNOSIS — F3289 Other specified depressive episodes: Secondary | ICD-10-CM | POA: Insufficient documentation

## 2012-12-14 DIAGNOSIS — R61 Generalized hyperhidrosis: Secondary | ICD-10-CM | POA: Insufficient documentation

## 2012-12-14 DIAGNOSIS — E785 Hyperlipidemia, unspecified: Secondary | ICD-10-CM | POA: Insufficient documentation

## 2012-12-14 DIAGNOSIS — Z954 Presence of other heart-valve replacement: Secondary | ICD-10-CM | POA: Insufficient documentation

## 2012-12-14 DIAGNOSIS — E1039 Type 1 diabetes mellitus with other diabetic ophthalmic complication: Secondary | ICD-10-CM | POA: Insufficient documentation

## 2012-12-14 DIAGNOSIS — I2510 Atherosclerotic heart disease of native coronary artery without angina pectoris: Secondary | ICD-10-CM | POA: Insufficient documentation

## 2012-12-14 DIAGNOSIS — Z87891 Personal history of nicotine dependence: Secondary | ICD-10-CM | POA: Insufficient documentation

## 2012-12-14 DIAGNOSIS — Z79899 Other long term (current) drug therapy: Secondary | ICD-10-CM | POA: Insufficient documentation

## 2012-12-14 DIAGNOSIS — F411 Generalized anxiety disorder: Secondary | ICD-10-CM | POA: Insufficient documentation

## 2012-12-14 DIAGNOSIS — E1065 Type 1 diabetes mellitus with hyperglycemia: Secondary | ICD-10-CM | POA: Insufficient documentation

## 2012-12-14 DIAGNOSIS — Z794 Long term (current) use of insulin: Secondary | ICD-10-CM | POA: Insufficient documentation

## 2012-12-14 DIAGNOSIS — Z8679 Personal history of other diseases of the circulatory system: Secondary | ICD-10-CM | POA: Insufficient documentation

## 2012-12-14 DIAGNOSIS — Z8739 Personal history of other diseases of the musculoskeletal system and connective tissue: Secondary | ICD-10-CM | POA: Insufficient documentation

## 2012-12-14 DIAGNOSIS — E11319 Type 2 diabetes mellitus with unspecified diabetic retinopathy without macular edema: Secondary | ICD-10-CM | POA: Insufficient documentation

## 2012-12-14 DIAGNOSIS — Z8709 Personal history of other diseases of the respiratory system: Secondary | ICD-10-CM | POA: Insufficient documentation

## 2012-12-14 DIAGNOSIS — R509 Fever, unspecified: Secondary | ICD-10-CM | POA: Insufficient documentation

## 2012-12-14 DIAGNOSIS — J449 Chronic obstructive pulmonary disease, unspecified: Secondary | ICD-10-CM | POA: Insufficient documentation

## 2012-12-14 DIAGNOSIS — Z87448 Personal history of other diseases of urinary system: Secondary | ICD-10-CM | POA: Insufficient documentation

## 2012-12-14 DIAGNOSIS — N200 Calculus of kidney: Secondary | ICD-10-CM | POA: Insufficient documentation

## 2012-12-14 LAB — CBC WITH DIFFERENTIAL/PLATELET
Eosinophils Relative: 0 % (ref 0–5)
HCT: 40.5 % (ref 39.0–52.0)
Hemoglobin: 14.7 g/dL (ref 13.0–17.0)
Lymphocytes Relative: 9 % — ABNORMAL LOW (ref 12–46)
Lymphs Abs: 0.6 10*3/uL — ABNORMAL LOW (ref 0.7–4.0)
MCV: 88 fL (ref 78.0–100.0)
Monocytes Absolute: 0.1 10*3/uL (ref 0.1–1.0)
RBC: 4.6 MIL/uL (ref 4.22–5.81)
WBC: 6.4 10*3/uL (ref 4.0–10.5)

## 2012-12-14 LAB — BASIC METABOLIC PANEL
CO2: 24 mEq/L (ref 19–32)
Calcium: 9 mg/dL (ref 8.4–10.5)
Creatinine, Ser: 0.91 mg/dL (ref 0.50–1.35)
Glucose, Bld: 150 mg/dL — ABNORMAL HIGH (ref 70–99)

## 2012-12-14 LAB — URINALYSIS, ROUTINE W REFLEX MICROSCOPIC
Bilirubin Urine: NEGATIVE
Hgb urine dipstick: NEGATIVE
Specific Gravity, Urine: 1.02 (ref 1.005–1.030)
pH: 6.5 (ref 5.0–8.0)

## 2012-12-14 MED ORDER — TAMSULOSIN HCL 0.4 MG PO CAPS: 0.4000 mg | ORAL_CAPSULE | Freq: Every day | ORAL | Status: DC

## 2012-12-14 MED ORDER — SODIUM CHLORIDE 0.9 % IV BOLUS (SEPSIS)
1000.0000 mL | Freq: Once | INTRAVENOUS | Status: AC
  Administered 2012-12-14: 1000 mL via INTRAVENOUS

## 2012-12-14 MED ORDER — ONDANSETRON HCL 4 MG/2ML IJ SOLN
4.0000 mg | Freq: Once | INTRAMUSCULAR | Status: AC
  Administered 2012-12-14: 4 mg via INTRAVENOUS
  Filled 2012-12-14: qty 2

## 2012-12-14 MED ORDER — MORPHINE SULFATE 4 MG/ML IJ SOLN: 4.0000 mg | Freq: Once | INTRAMUSCULAR | Status: DC

## 2012-12-14 MED ORDER — KETOROLAC TROMETHAMINE 30 MG/ML IJ SOLN
30.0000 mg | Freq: Once | INTRAMUSCULAR | Status: AC
  Administered 2012-12-14: 30 mg via INTRAVENOUS
  Filled 2012-12-14: qty 1

## 2012-12-14 MED ORDER — MORPHINE SULFATE 4 MG/ML IJ SOLN
4.0000 mg | INTRAMUSCULAR | Status: DC | PRN
  Administered 2012-12-14: 4 mg via INTRAVENOUS
  Filled 2012-12-14: qty 1

## 2012-12-14 MED ORDER — OXYCODONE-ACETAMINOPHEN 5-325 MG PO TABS: 1.0000 | ORAL_TABLET | ORAL | Status: DC | PRN

## 2012-12-14 NOTE — ED Notes (Signed)
Pt complains of left flank pain that acutely started about 1130pm

## 2012-12-14 NOTE — ED Provider Notes (Signed)
History     CSN: [id number]  Arrival date & time 12/14/12  4098   First MD Initiated Contact with Patient 12/14/12 0242      Chief Complaint  Patient presents with  . Flank Pain   HPI  History provided by the patient. Patient is a 57 year old male with history of diabetes, renal insufficiency, COPD who presents with complaints of acute onset left flank pain this. Pain started between 11:15 11:30 last evening. Pain is sharp and radiates down to the left groin testicle and penis. Symptoms are similar to previous kidney stones the patient last had in the 80s. Symptoms are associated slight nausea but he has not had any episodes of vomiting. Does feel some chills and fever. He has not used any treatment for symptoms. Denies any other aggravating or alleviating factors. Denies any other associated symptoms. He has not been able to urinate since symptoms began. Has not noticed any hematuria. No dysuria.   Past Medical History  Diagnosis Date  . DIABETES MELLITUS, TYPE I 04/03/2007  . 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 DIABETIC RETINOPATHY, PROLIFERATIVE 04/03/2007  . DM W/EYE MANIFESTATIONS, TYPE I, UNCONTROLLED 04/04/2007  . HYPERLIPIDEMIA 04/04/2007  . ANXIETY 04/03/2007  . DEPRESSION 04/03/2007  . CARPAL TUNNEL SYNDROME, BILATERAL 07/31/2007  . CORONARY ARTERY DISEASE 04/03/2007  . ASTHMATIC BRONCHITIS, ACUTE 10/25/2008  . ASTHMA 09/06/2008  . RENAL INSUFFICIENCY 07/31/2007  . CHEST PAIN 10/25/2008  . Renal insufficiency   . ED (erectile dysfunction)   . Cervical disc disease   . Bladder neck obstruction   . COPD (chronic obstructive pulmonary disease)   . Spinal stenosis   . Depression   . Anxiety     Past Surgical History  Procedure Laterality Date  . Ptca      stent  . Spine surgery      s/p C-spine disc surgery-?fusion  . Appendectomy    . Tonsillectomy    . Stress cardiolite  09/06/2002    Family History  Problem Relation Age of Onset  . Stroke Father      strong FH cerebrovascular disease  . Diabetes Brother   . Heart disease Brother     CHF  . Colon cancer Neg Hx     History  Substance Use Topics  . Smoking status: Former Games developer  . Smokeless tobacco: Former Neurosurgeon    Quit date: 02/16/2010  . Alcohol Use: No      Review of Systems  Constitutional: Positive for fever, chills and diaphoresis.  Gastrointestinal: Positive for nausea. Negative for vomiting, abdominal pain and constipation.  Genitourinary: Positive for flank pain. Negative for dysuria, frequency and hematuria.  All other systems reviewed and are negative.    Allergies  Iohexol  Home Medications   Current Outpatient Rx  Name  Route  Sig  Dispense  Refill  . acetaminophen (TYLENOL) 500 MG tablet   Oral   Take 1,000 mg by mouth every 6 (six) hours as needed for pain.         [name] albuterol (PROVENTIL HFA;VENTOLIN HFA) 108 (90 BASE) MCG/ACT inhaler   Inhalation   Inhale 2 puffs into the lungs every 6 (six) hours as needed for wheezing or shortness of breath.   1 Inhaler   1   . DULoxetine (CYMBALTA) 60 MG capsule   Oral   Take 60 mg by mouth daily.         [name] esomeprazole (NEXIUM) 40 MG capsule   Oral   Take  40 mg by mouth daily as needed (for stomach).         [name] ibuprofen (ADVIL,MOTRIN) 200 MG tablet   Oral   Take 200 mg by mouth every 6 (six) hours as needed for pain.          [name] insulin glargine (LANTUS) 100 UNIT/ML injection   Subcutaneous   Inject 44 Units into the skin at bedtime.          . insulin lispro (HUMALOG KWIKPEN) 100 unit/mL SOLN   Subcutaneous   Inject 5-18 Units into the skin as directed. Pt uses per sliding scale and checks blood sugar 4-5 times daily         . predniSONE (DELTASONE) 20 MG tablet   Oral   Take 3 tablets (60 mg total) by mouth daily.   15 tablet   0   . simvastatin (ZOCOR) 40 MG tablet   Oral   Take 40 mg by mouth every evening.         . traZODone (DESYREL) 100 MG tablet   Oral   Take 100 mg by  mouth at bedtime.         . varenicline (CHANTIX) 1 MG tablet   Oral   Take 1 mg by mouth daily.           BP 125/64  Pulse 66  Temp(Src) 97.9 F (36.6 C) (Oral)  Resp 20  Ht 5\' 9"  (1.753 m)  Wt 170 lb (77.111 kg)  BMI 25.09 kg/m2  SpO2 96%  Physical Exam  Nursing note and vitals reviewed. Constitutional: He is oriented to person, place, and time. He appears well-developed and well-nourished. No distress.  HENT:  Head: Normocephalic.  Cardiovascular: Normal rate and regular rhythm.   Pulmonary/Chest: Effort normal and breath sounds normal. No respiratory distress. He has no wheezes.  Abdominal: Soft. There is tenderness in the left lower quadrant. There is CVA tenderness. There is no rebound, no guarding, no tenderness at McBurney's point and negative Murphy's sign.  Musculoskeletal: Normal range of motion.  Neurological: He is alert and oriented to person, place, and time.  Skin: Skin is warm.  Psychiatric: He has a normal mood and affect. His behavior is normal.    ED Course  Procedures   Results for orders placed during the hospital encounter of 12/14/12  URINALYSIS, ROUTINE W REFLEX MICROSCOPIC      Result Value Range   Color, Urine YELLOW  YELLOW   APPearance CLEAR  CLEAR   Specific Gravity, Urine 1.020  1.005 - 1.030   pH 6.5  5.0 - 8.0   Glucose, UA 250 (*) NEGATIVE mg/dL   Hgb urine dipstick NEGATIVE  NEGATIVE   Bilirubin Urine NEGATIVE  NEGATIVE   Ketones, ur NEGATIVE  NEGATIVE mg/dL   Protein, ur NEGATIVE  NEGATIVE mg/dL   Urobilinogen, UA 1.0  0.0 - 1.0 mg/dL   Nitrite NEGATIVE  NEGATIVE   Leukocytes, UA NEGATIVE  NEGATIVE  GLUCOSE, CAPILLARY      Result Value Range   Glucose-Capillary 124 (*) 70 - 99 mg/dL  CBC WITH DIFFERENTIAL      Result Value Range   WBC 6.4  4.0 - 10.5 K/uL   RBC 4.60  4.22 - 5.81 MIL/uL   Hemoglobin 14.7  13.0 - 17.0 g/dL   HCT 52.8  41.3 - 24.4 %   MCV 88.0  78.0 - 100.0 fL   MCH 32.0  26.0 - 34.0 pg   MCHC 36.3 (*)  30.0 - 36.0 g/dL   RDW 16.1  09.6 - 04.5 %   Platelets 311  150 - 400 K/uL   Neutrophils Relative % 90 (*) 43 - 77 %   Neutro Abs 5.8  1.7 - 7.7 K/uL   Lymphocytes Relative 9 (*) 12 - 46 %   Lymphs Abs 0.6 (*) 0.7 - 4.0 K/uL   Monocytes Relative 1 (*) 3 - 12 %   Monocytes Absolute 0.1  0.1 - 1.0 K/uL   Eosinophils Relative 0  0 - 5 %   Eosinophils Absolute 0.0  0.0 - 0.7 K/uL   Basophils Relative 0  0 - 1 %   Basophils Absolute 0.0  0.0 - 0.1 K/uL  BASIC METABOLIC PANEL      Result Value Range   Sodium 134 (*) 135 - 145 mEq/L   Potassium 4.0  3.5 - 5.1 mEq/L   Chloride 98  96 - 112 mEq/L   CO2 24  19 - 32 mEq/L   Glucose, Bld 150 (*) 70 - 99 mg/dL   BUN 15  6 - 23 mg/dL   Creatinine, Ser 4.09  0.50 - 1.35 mg/dL   Calcium 9.0  8.4 - 81.1 mg/dL   GFR calc non Af Amer >90  >90 mL/min   GFR calc Af Amer >90  >90 mL/min  GLUCOSE, CAPILLARY      Result Value Range   Glucose-Capillary 180 (*) 70 - 99 mg/dL      Ct Abdomen Pelvis Wo Contrast  12/14/2012   *RADIOLOGY REPORT*  Clinical Data: Left flank pain.  CT ABDOMEN AND PELVIS WITHOUT CONTRAST  Technique:  Multidetector CT imaging of the abdomen and pelvis was performed following the standard protocol without intravenous contrast.  Comparison: Abdominal ultrasound performed 11/17/2010, and MRI of the lumbar spine performed 01/17/2012  Findings: Mild bibasilar atelectasis is noted.  The liver is unremarkable in appearance.  Scattered calcified granulomata are seen within the spleen.  The gallbladder is within normal limits.  The pancreas and adrenal glands are unremarkable.  There is mild left-sided hydronephrosis, with prominence of the left ureter along its entire course, and diffuse left-sided perinephric stranding and fluid.  A 4 mm stone is noted at the left side of the base of the bladder, reflecting a recently passed or barely obstructing stone.  Scattered nonobstructing bilateral renal stones are seen, slightly more prominent on the  left.  Mild nonspecific perinephric stranding is also noted at the right kidney.  No free fluid is identified.  The small bowel is unremarkable in appearance.  The stomach is within normal limits.  No acute vascular abnormalities are seen.  Mild calcification is noted at the origins of the renal arteries, and scattered calcification is seen along the distal abdominal aorta and its branches.  The patient is status post appendectomy.  The colon is partially filled with stool and is unremarkable in appearance.  The bladder is mildly distended and otherwise unremarkable in appearance.  The prostate remains normal in size.  No inguinal lymphadenopathy is seen.  No acute osseous abnormalities are identified.  IMPRESSION:  1.  Mild left-sided hydronephrosis, with diffuse left-sided perinephric stranding and fluid. 4 mm stone at the left side of the base of the bladder, reflecting a recently passed or barely obstructing stone. 2.  Scattered nonobstructing bilateral renal stones, slightly more prominent on the left. 3.  Scattered calcification along the distal abdominal aorta and its branches, and mild calcification at the origins of the renal  arteries.  4.  Mild bibasilar atelectasis noted.   Original Report Authenticated By: Tonia Ghent, M.D.     1. Kidney stone       MDM  2:45AM patient seen and evaluated. Patient appears uncomfortable rocking in the bed.  Patient feeling much better after pain medications.  CT shows likely passed a 4 mm stone. Patient continues to be well. At this time we'll discharge home.   Angus Seller, PA-C 12/14/12 [phone]

## 2012-12-14 NOTE — ED Notes (Signed)
Pt sts he is unable to void at this time  

## 2012-12-15 NOTE — ED Provider Notes (Signed)
Medical screening examination/treatment/procedure(s) were performed by non-physician practitioner and as supervising physician I was immediately available for consultation/collaboration.  Sunnie Nielsen, MD 12/15/12 [phone]

## 2012-12-20 ENCOUNTER — Ambulatory Visit (INDEPENDENT_AMBULATORY_CARE_PROVIDER_SITE_OTHER): Payer: Commercial Managed Care - PPO | Admitting: Ophthalmology

## 2012-12-20 DIAGNOSIS — E1039 Type 1 diabetes mellitus with other diabetic ophthalmic complication: Secondary | ICD-10-CM

## 2012-12-20 DIAGNOSIS — E11359 Type 2 diabetes mellitus with proliferative diabetic retinopathy without macular edema: Secondary | ICD-10-CM

## 2012-12-20 DIAGNOSIS — E1065 Type 1 diabetes mellitus with hyperglycemia: Secondary | ICD-10-CM

## 2013-03-07 ENCOUNTER — Other Ambulatory Visit: Payer: Self-pay | Admitting: Urology

## 2013-03-28 ENCOUNTER — Other Ambulatory Visit (HOSPITAL_COMMUNITY): Payer: Self-pay | Admitting: Urology

## 2013-03-28 ENCOUNTER — Encounter (HOSPITAL_BASED_OUTPATIENT_CLINIC_OR_DEPARTMENT_OTHER): Admission: RE | Payer: Self-pay | Source: Ambulatory Visit

## 2013-03-28 ENCOUNTER — Ambulatory Visit (HOSPITAL_BASED_OUTPATIENT_CLINIC_OR_DEPARTMENT_OTHER): Admission: RE | Admit: 2013-03-28 | Payer: 59 | Source: Ambulatory Visit | Admitting: Urology

## 2013-03-28 SURGERY — CYSTOURETEROSCOPY, WITH RETROGRADE PYELOGRAM AND STENT INSERTION
Anesthesia: General | Laterality: Bilateral

## 2013-03-28 NOTE — Patient Instructions (Addendum)
[address]ller  03/28/2013   Your procedure is scheduled on: 04-06-2013  Report to [facility] at  530 AM.  Call this number if you have problems the morning of surgery [phone]   Remember:TAKE 1/2 DOSE BEDTIME LANTUS INSULIN 04-05-2013   Do not eat food or drink liquids :After Midnight.     Take these medicines the morning of surgery with A SIP OF WATER: chantix,  Bring albuterol inhaler and leave inhaler with your wife, use if needed                                SEE [facility] PREPARING FOR SURGERY SHEET   Do not wear jewelry, make-up.  Do not wear lotions, powders, or perfumes. You may wear deodorant.   Men may shave face and neck.  Do not bring valuables to the hospital. [facility] IS NOT RESPONSIBLE FOR VALUEABLES.  Contacts, dentures or bridgework may not be worn into surgery.  Leave suitcase in the car. After surgery it may be brought to your room.  For patients admitted to the hospital, checkout time is 11:00 AM the [place] of discharge.   Patients discharged the [place] of surgery will not be allowed to drive home.  Name and phone number of your driver:  Special Instructions: N/A   Please read over the following fact sheets that you were given:   Call Cain Sieve RN pre op nurse if needed [phone]    FAILURE TO FOLLOW THESE INSTRUCTIONS MAY RESULT IN THE CANCELLATION OF YOUR SURGERY.  PATIENT SIGNATURE___________________________________________  NURSE SIGNATURE_____________________________________________

## 2013-03-30 ENCOUNTER — Encounter (HOSPITAL_COMMUNITY): Payer: Self-pay | Admitting: Pharmacy Technician

## 2013-03-30 ENCOUNTER — Encounter (HOSPITAL_COMMUNITY)
Admission: RE | Admit: 2013-03-30 | Discharge: 2013-03-30 | Disposition: A | Discharge status: Home / Self Care | Payer: 59 | Source: Ambulatory Visit | Attending: Urology | Admitting: Urology

## 2013-03-30 ENCOUNTER — Encounter (HOSPITAL_COMMUNITY): Payer: Self-pay

## 2013-03-30 DIAGNOSIS — Z01812 Encounter for preprocedural laboratory examination: Secondary | ICD-10-CM | POA: Insufficient documentation

## 2013-03-30 DIAGNOSIS — N200 Calculus of kidney: Secondary | ICD-10-CM | POA: Insufficient documentation

## 2013-03-30 LAB — BASIC METABOLIC PANEL
CO2: 29 mEq/L (ref 19–32)
Calcium: 9.2 mg/dL (ref 8.4–10.5)
Chloride: 100 mEq/L (ref 96–112)
Creatinine, Ser: 0.81 mg/dL (ref 0.50–1.35)
Glucose, Bld: 263 mg/dL — ABNORMAL HIGH (ref 70–99)
Sodium: 134 mEq/L — ABNORMAL LOW (ref 135–145)

## 2013-03-30 LAB — CBC
HCT: 40.5 % (ref 39.0–52.0)
MCH: 32.3 pg (ref 26.0–34.0)
MCV: 87.3 fL (ref 78.0–100.0)
Platelets: 240 10*3/uL (ref 150–400)
RBC: 4.64 MIL/uL (ref 4.22–5.81)

## 2013-03-30 NOTE — Progress Notes (Signed)
bmet results sent by epic fax to dr Berneice Heinrich

## 2013-03-30 NOTE — Progress Notes (Signed)
ekg and 2 view chest xray 12-09-2012

## 2013-04-05 MED ORDER — GENTAMICIN SULFATE 40 MG/ML IJ SOLN
5.0000 mg/kg | INTRAVENOUS | Status: AC
  Administered 2013-04-06: 390 mg via INTRAVENOUS
  Filled 2013-04-05 (×2): qty 9.66

## 2013-04-05 NOTE — Anesthesia Preprocedure Evaluation (Addendum)
Anesthesia Evaluation  Patient identified by MRN, date of birth, ID band Patient awake    Reviewed: Allergy & Precautions, H&P , NPO status , Patient's Chart, lab work & pertinent test results  Airway Mallampati: II TM Distance: >3 FB Neck ROM: Full    Dental  (+) Poor Dentition, Teeth Intact, Chipped and Dental Advisory Given,    Pulmonary asthma , COPDCurrent Smoker,  breath sounds clear to auscultation  Pulmonary exam normal       Cardiovascular + CAD (patient denies CAD) Rhythm:Regular Rate:Normal     Neuro/Psych PSYCHIATRIC DISORDERS Anxiety Depression  Neuromuscular disease negative neurological ROS  negative psych ROS   GI/Hepatic Neg liver ROS, GERD-  ,  Endo/Other  diabetes, Poorly Controlled, Type 1, Insulin Dependent  Renal/GU Renal InsufficiencyRenal disease  negative genitourinary   Musculoskeletal negative musculoskeletal ROS (+)   Abdominal   Peds negative pediatric ROS (+)  Hematology negative hematology ROS (+)   Anesthesia Other Findings   Reproductive/Obstetrics                          Anesthesia Physical Anesthesia Plan  ASA: III  Anesthesia Plan: General   Post-op Pain Management:    Induction: Intravenous  Airway Management Planned: LMA  Additional Equipment:   Intra-op Plan:   Post-operative Plan: Extubation in OR  Informed Consent: I have reviewed the patients History and Physical, chart, labs and discussed the procedure including the risks, benefits and alternatives for the proposed anesthesia with the patient or authorized representative who has indicated his/her understanding and acceptance.   Dental advisory given  Plan Discussed with: CRNA  Anesthesia Plan Comments:        Anesthesia Quick Evaluation

## 2013-04-06 ENCOUNTER — Encounter (HOSPITAL_COMMUNITY)
Admission: RE | Disposition: A | Discharge status: Home / Self Care | Payer: Self-pay | Source: Ambulatory Visit | Attending: Urology

## 2013-04-06 ENCOUNTER — Encounter (HOSPITAL_COMMUNITY): Payer: Self-pay | Admitting: *Deleted

## 2013-04-06 ENCOUNTER — Encounter (HOSPITAL_COMMUNITY): Payer: Self-pay | Admitting: Anesthesiology

## 2013-04-06 ENCOUNTER — Ambulatory Visit (HOSPITAL_COMMUNITY): Payer: 59 | Admitting: Anesthesiology

## 2013-04-06 ENCOUNTER — Ambulatory Visit (HOSPITAL_COMMUNITY)
Admission: RE | Admit: 2013-04-06 | Discharge: 2013-04-06 | Disposition: A | Discharge status: Home / Self Care | Payer: 59 | Source: Ambulatory Visit | Attending: Urology | Admitting: Urology

## 2013-04-06 ENCOUNTER — Other Ambulatory Visit: Payer: Self-pay | Admitting: Urology

## 2013-04-06 DIAGNOSIS — N201 Calculus of ureter: Secondary | ICD-10-CM | POA: Insufficient documentation

## 2013-04-06 DIAGNOSIS — Z794 Long term (current) use of insulin: Secondary | ICD-10-CM | POA: Insufficient documentation

## 2013-04-06 DIAGNOSIS — N200 Calculus of kidney: Secondary | ICD-10-CM | POA: Insufficient documentation

## 2013-04-06 DIAGNOSIS — J449 Chronic obstructive pulmonary disease, unspecified: Secondary | ICD-10-CM | POA: Insufficient documentation

## 2013-04-06 DIAGNOSIS — J4489 Other specified chronic obstructive pulmonary disease: Secondary | ICD-10-CM | POA: Insufficient documentation

## 2013-04-06 DIAGNOSIS — I2510 Atherosclerotic heart disease of native coronary artery without angina pectoris: Secondary | ICD-10-CM | POA: Insufficient documentation

## 2013-04-06 DIAGNOSIS — E109 Type 1 diabetes mellitus without complications: Secondary | ICD-10-CM | POA: Insufficient documentation

## 2013-04-06 HISTORY — PX: CYSTOSCOPY WITH RETROGRADE PYELOGRAM, URETEROSCOPY AND STENT PLACEMENT: SHX5789

## 2013-04-06 HISTORY — PX: HOLMIUM LASER APPLICATION: SHX5852

## 2013-04-06 LAB — GLUCOSE, CAPILLARY: Glucose-Capillary: 132 mg/dL — ABNORMAL HIGH (ref 70–99)

## 2013-04-06 SURGERY — CYSTOURETEROSCOPY, WITH RETROGRADE PYELOGRAM AND STENT INSERTION
Anesthesia: General | Laterality: Left | Wound class: Clean Contaminated

## 2013-04-06 MED ORDER — LACTATED RINGERS IV SOLN: INTRAVENOUS | Status: DC

## 2013-04-06 MED ORDER — LACTATED RINGERS IV SOLN
INTRAVENOUS | Status: DC | PRN
  Administered 2013-04-06 (×2): via INTRAVENOUS

## 2013-04-06 MED ORDER — DEXAMETHASONE SODIUM PHOSPHATE 10 MG/ML IJ SOLN
INTRAMUSCULAR | Status: DC | PRN
  Administered 2013-04-06: 10 mg via INTRAVENOUS

## 2013-04-06 MED ORDER — HYDROMORPHONE HCL PF 1 MG/ML IJ SOLN
0.2500 mg | INTRAMUSCULAR | Status: DC | PRN
  Administered 2013-04-06: 0.5 mg via INTRAVENOUS

## 2013-04-06 MED ORDER — MIDAZOLAM HCL 5 MG/5ML IJ SOLN
INTRAMUSCULAR | Status: DC | PRN
  Administered 2013-04-06: 2 mg via INTRAVENOUS

## 2013-04-06 MED ORDER — 0.9 % SODIUM CHLORIDE (POUR BTL) OPTIME
TOPICAL | Status: DC | PRN
  Administered 2013-04-06: 1000 mL

## 2013-04-06 MED ORDER — PROMETHAZINE HCL 25 MG/ML IJ SOLN
INTRAMUSCULAR | Status: AC
  Filled 2013-04-06: qty 1

## 2013-04-06 MED ORDER — IOHEXOL 300 MG/ML  SOLN
INTRAMUSCULAR | Status: DC | PRN
  Administered 2013-04-06: 32 mL

## 2013-04-06 MED ORDER — DIAZEPAM 5 MG PO TABS
10.0000 mg | ORAL_TABLET | Freq: Once | ORAL | Status: AC
  Administered 2013-04-06: 10 mg via ORAL
  Filled 2013-04-06 (×2): qty 2

## 2013-04-06 MED ORDER — PROMETHAZINE HCL 25 MG/ML IJ SOLN
6.2500 mg | INTRAMUSCULAR | Status: AC | PRN
  Administered 2013-04-06 (×2): 6.25 mg via INTRAVENOUS
  Filled 2013-04-06: qty 1

## 2013-04-06 MED ORDER — SODIUM CHLORIDE 0.9 % IR SOLN
Status: DC | PRN
  Administered 2013-04-06: 5000 mL

## 2013-04-06 MED ORDER — OXYBUTYNIN CHLORIDE 5 MG PO TABS: 5.0000 mg | ORAL_TABLET | Freq: Three times a day (TID) | ORAL | Status: DC | PRN

## 2013-04-06 MED ORDER — OXYCODONE-ACETAMINOPHEN 5-325 MG PO TABS: 1.0000 | ORAL_TABLET | Freq: Four times a day (QID) | ORAL | Status: DC | PRN

## 2013-04-06 MED ORDER — HYDROMORPHONE HCL PF 1 MG/ML IJ SOLN
INTRAMUSCULAR | Status: AC
  Filled 2013-04-06: qty 1

## 2013-04-06 MED ORDER — SENNOSIDES-DOCUSATE SODIUM 8.6-50 MG PO TABS: 1.0000 | ORAL_TABLET | Freq: Two times a day (BID) | ORAL | Status: DC

## 2013-04-06 MED ORDER — OXYCODONE-ACETAMINOPHEN 5-325 MG PO TABS
1.0000 | ORAL_TABLET | Freq: Four times a day (QID) | ORAL | Status: DC | PRN
  Administered 2013-04-06: 1 via ORAL
  Filled 2013-04-06: qty 1

## 2013-04-06 MED ORDER — ONDANSETRON HCL 4 MG/2ML IJ SOLN
INTRAMUSCULAR | Status: DC | PRN
  Administered 2013-04-06: 4 mg via INTRAVENOUS

## 2013-04-06 MED ORDER — FENTANYL CITRATE 0.05 MG/ML IJ SOLN
INTRAMUSCULAR | Status: DC | PRN
  Administered 2013-04-06: 25 ug via INTRAVENOUS
  Administered 2013-04-06: 50 ug via INTRAVENOUS
  Administered 2013-04-06: 100 ug via INTRAVENOUS
  Administered 2013-04-06 (×5): 25 ug via INTRAVENOUS
  Administered 2013-04-06: 50 ug via INTRAVENOUS

## 2013-04-06 MED ORDER — OXYBUTYNIN CHLORIDE 5 MG PO TABS
5.0000 mg | ORAL_TABLET | Freq: Three times a day (TID) | ORAL | Status: DC
  Administered 2013-04-06: 5 mg via ORAL
  Filled 2013-04-06: qty 1

## 2013-04-06 MED ORDER — PROPOFOL 10 MG/ML IV BOLUS
INTRAVENOUS | Status: DC | PRN
  Administered 2013-04-06: 200 mg via INTRAVENOUS

## 2013-04-06 MED ORDER — HYDROCODONE-ACETAMINOPHEN 7.5-325 MG PO TABS: 1.0000 | ORAL_TABLET | ORAL | Status: DC | PRN

## 2013-04-06 SURGICAL SUPPLY — 25 items
BAG URINE DRAINAGE (UROLOGICAL SUPPLIES) ×3 IMPLANT
BASKET LASER NITINOL 1.9FR (BASKET) ×1 IMPLANT
BASKET STNLS GEMINI 4WIRE 3FR (BASKET) IMPLANT
BASKET ZERO TIP NITINOL 2.4FR (BASKET) IMPLANT
BSKT STON RTRVL 120 1.9FR (BASKET) ×2
BSKT STON RTRVL GEM 120X11 3FR (BASKET)
BSKT STON RTRVL ZERO TP 2.4FR (BASKET)
CATH INTERMIT  6FR 70CM (CATHETERS) ×3 IMPLANT
DRAPE CAMERA CLOSED 9X96 (DRAPES) ×3 IMPLANT
ELECT REM PT RETURN 9FT ADLT (ELECTROSURGICAL)
ELECTRODE REM PT RTRN 9FT ADLT (ELECTROSURGICAL) IMPLANT
FIBER LASER FLEXIVA 200 (UROLOGICAL SUPPLIES) IMPLANT
GLOVE BIOGEL M STRL SZ7.5 (GLOVE) ×3 IMPLANT
GOWN PREVENTION PLUS LG XLONG (DISPOSABLE) ×3 IMPLANT
GUIDEWIRE ANG ZIPWIRE 038X150 (WIRE) ×3 IMPLANT
GUIDEWIRE STR DUAL SENSOR (WIRE) ×3 IMPLANT
IV NS IRRIG 3000ML ARTHROMATIC (IV SOLUTION) ×3 IMPLANT
LASER FIBER DISP (UROLOGICAL SUPPLIES) ×1 IMPLANT
PACK CYSTO (CUSTOM PROCEDURE TRAY) ×3 IMPLANT
SHEATH ACCESS URETERAL 38CM (SHEATH) ×1 IMPLANT
STENT CONTOUR 6FRX24X.038 (STENTS) ×2 IMPLANT
STENT CONTOUR 6FRX26X.038 (STENTS) ×2 IMPLANT
SYRINGE 10CC LL (SYRINGE) IMPLANT
SYRINGE IRR TOOMEY STRL 70CC (SYRINGE) IMPLANT
TUBE FEEDING 8FR 16IN STR KANG (MISCELLANEOUS) ×3 IMPLANT

## 2013-04-06 NOTE — Progress Notes (Signed)
Awake c/o some pain after sleeping for approx. 1 hour

## 2013-04-06 NOTE — Transfer of Care (Signed)
Immediate Anesthesia Transfer of Care Note  Patient: James [name]  Procedure(s) Performed: Procedure(s): BILATERAL CYSTOSCOPY WITH RETROGRADE PYELOGRAMS, STENT PLACEMENTS AND LEFT URETEROSCOPY AND STONE REMOVAL (Bilateral) HOLMIUM LASER APPLICATION (Left)  Patient Location: PACU  Anesthesia Type:General  Level of Consciousness: awake, alert  and oriented  Airway & Oxygen Therapy: Patient Spontanous Breathing and Patient connected to face mask oxygen  Post-op Assessment: Report given to PACU RN and Post -op Vital signs reviewed and stable  Post vital signs: Reviewed and stable  Complications: No apparent anesthesia complications

## 2013-04-06 NOTE — Progress Notes (Signed)
Called wife on phone re discharge instructions. Wife verbalizes she understands instructions. No questions.

## 2013-04-06 NOTE — Preoperative (Signed)
Beta Blockers   Reason not to administer Beta Blockers:Not Applicable 

## 2013-04-06 NOTE — Brief Op Note (Signed)
04/06/2013  9:18 AM  PATIENT:  James [name]  57 y.o. male  PRE-OPERATIVE DIAGNOSIS:  BILATERAL KIDNEY STONES  POST-OPERATIVE DIAGNOSIS:  BILATERAL KIDNEY STONES  PROCEDURE:  Procedure(s): BILATERAL CYSTOSCOPY WITH RETROGRADE PYELOGRAMS, STENT PLACEMENTS AND LEFT URETEROSCOPY AND STONE REMOVAL (Bilateral) HOLMIUM LASER APPLICATION (Left)  SURGEON:  Surgeon(s) and Role:    * Sebastian Ache, MD - Primary  PHYSICIAN ASSISTANT:   ASSISTANTS: none   ANESTHESIA:   general  EBL:  Total I/O In: 1000 [I.V.:1000] Out: -   BLOOD ADMINISTERED:none  DRAINS: none   LOCAL MEDICATIONS USED:  NONE  SPECIMEN:  Source of Specimen:  Left Renal and Ureteral Stones  DISPOSITION OF SPECIMEN:  [facility] for Compositional Analysis  COUNTS:  YES  TOURNIQUET:  * No tourniquets in log *  DICTATION: .Other Dictation: Dictation Number [id number]  PLAN OF CARE: Discharge to home after PACU  PATIENT DISPOSITION:  PACU - hemodynamically stable.   Delay start of Pharmacological VTE agent (>24hrs) due to surgical blood loss or risk of bleeding: not applicable

## 2013-04-06 NOTE — Anesthesia Postprocedure Evaluation (Signed)
Anesthesia Post Note  Patient: James [name]  Procedure(s) Performed: Procedure(s) (LRB): BILATERAL CYSTOSCOPY WITH RETROGRADE PYELOGRAMS, STENT PLACEMENTS AND LEFT URETEROSCOPY AND STONE REMOVAL (Bilateral) HOLMIUM LASER APPLICATION (Left)  Anesthesia type: General  Patient location: PACU  Post pain: Pain level controlled  Post assessment: Post-op Vital signs reviewed  Last Vitals:  Filed Vitals:   04/06/13 1026  BP: 136/74  Pulse: 91  Temp: 36.7 C  Resp: 16    Post vital signs: Reviewed  Level of consciousness: sedated  Complications: No apparent anesthesia complications

## 2013-04-06 NOTE — H&P (Signed)
Brolin Dambrosia Hofmann is an 57 y.o. male.    Chief Complaint: Pre Op Bilateral Ureteroscopic Stone Manipulation  HPI:   1 - Nephrolithiasis - Pt with left 4mm UVJ stone + small scattered bilateral intrarenal stones (all <79mm) found on CT in ER 12/14/12 on w/u flank pain. Two prior epsidoes 1984 and 1985 passed spontaneously.  Given trial of medical therapy with tamsulsoin / pain meds and reports no interval passage and continued mild left sided colic.  2 - Microhematuria - Pt with long h/o microhematuria s/p negative complete evals in 1990's as well as early 2000's. No gross episodes.   3 - Lower Urinary Tract Symptoms - Long h/o obstructive LUTS ann even one episode of retention. Was previously managed on tamsulosin QD.  PMH sig for IDDM1, COPD, CAD/Stent (no baseline CP/SOB/activity limitation), Appy, TNA.  Today Finnian is seen to proceed with bilateral ureteroscopic stone manipulation for goal of stone free. Most recent UA w/o infections parameters. No interval fevers.   Past Medical History  Diagnosis Date  . DIABETES MELLITUS, TYPE I 04/03/2007  . DIABETIC RETINOPATHY, PROLIFERATIVE 04/03/2007  . DM W/EYE MANIFESTATIONS, TYPE I, UNCONTROLLED 04/04/2007  . HYPERLIPIDEMIA 04/04/2007  . ANXIETY 04/03/2007  . DEPRESSION 04/03/2007  . ASTHMATIC BRONCHITIS, ACUTE 10/25/2008  . ASTHMA 09/06/2008  . ED (erectile dysfunction)   . Cervical disc disease   . Bladder neck obstruction   . COPD (chronic obstructive pulmonary disease)   . Spinal stenosis   . Depression   . Anxiety   . CORONARY ARTERY DISEASE 04/03/2007  . 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 RENAL INSUFFICIENCY 07/31/2007  . Renal insufficiency   . CARPAL TUNNEL SYNDROME, BILATERAL 07/31/2007    issues resolved, no surgery    Past Surgical History  Procedure Laterality Date  . Ptca    . Spine surgery      s/p C-spine disc surgery-?fusion  . Appendectomy    . Stress cardiolite  09/06/2002  . Tonsillectomy    . Lymph  node removed from groin  age 12    Family History  Problem Relation Age of Onset  . Stroke Father     strong FH cerebrovascular disease  . Diabetes Brother   . Heart disease Brother     CHF  . Colon cancer Neg Hx    Social History:  reports that he has been smoking Cigarettes.  He has a 38 pack-year smoking history. He has never used smokeless tobacco. He reports that he does not drink alcohol or use illicit drugs.  Allergies:  Allergies  Allergen Reactions  . Iohexol      Code: HIVES, Desc: PATIENT STATES HE IS ALLERGIC TO IV DYE, nervousness, dizziness, light sweat     Medications Prior to Admission  Medication Sig Dispense Refill  . albuterol (PROVENTIL HFA;VENTOLIN HFA) 108 (90 BASE) MCG/ACT inhaler Inhale 2 puffs into the lungs every 6 (six) hours as needed for wheezing or shortness of breath.      . escitalopram (LEXAPRO) 20 MG tablet Take 20 mg by mouth every evening.       [name] HYDROcodone-acetaminophen (NORCO) 7.5-325 MG per tablet Take 1 tablet by mouth every 6 (six) hours as needed for pain.      [name] insulin glargine (LANTUS) 100 UNIT/ML injection Inject 44 Units into the skin at bedtime.       . insulin lispro (HUMALOG KWIKPEN) 100 unit/mL SOLN Inject 1-25 Units into the skin as directed. Pt uses per sliding scale  and checks blood sugar 4-5 times daily BS 120 every 20 above 120 give 1 unit.      . simvastatin (ZOCOR) 40 MG tablet Take 40 mg by mouth every evening.      . traZODone (DESYREL) 100 MG tablet Take 150 mg by mouth at bedtime.       . varenicline (CHANTIX) 1 MG tablet Take 1 mg by mouth 2 (two) times daily.       [name] acetaminophen (TYLENOL) 500 MG tablet Take 1,000 mg by mouth every 6 (six) hours as needed for pain.      [name] ibuprofen (ADVIL,MOTRIN) 200 MG tablet Take 200 mg by mouth every 6 (six) hours as needed for pain.       . tamsulosin (FLOMAX) 0.4 MG CAPS capsule Take 0.4 mg by mouth daily after supper.        Results for orders placed during the hospital  encounter of 04/06/13 (from the past 48 hour(s))  GLUCOSE, CAPILLARY     Status: Abnormal   Collection Time    04/06/13  6:12 AM      Result Value Range   Glucose-Capillary 132 (*) 70 - 99 mg/dL   No results found.  Review of Systems  Constitutional: Negative.  Negative for fever and chills.  HENT: Negative.   Eyes: Negative.   Respiratory: Negative.   Cardiovascular: Negative.   Gastrointestinal: Positive for nausea. Negative for vomiting.  Genitourinary: Positive for flank pain.  Musculoskeletal: Negative.   Skin: Negative.   Neurological: Negative.   Endo/Heme/Allergies: Negative.   Psychiatric/Behavioral: Negative.     Blood pressure 137/83, pulse 91, temperature 98.2 F (36.8 C), temperature source Oral, resp. rate 16, weight 77.29 kg (170 lb 6.3 oz), SpO2 100.00%. Physical Exam  Constitutional: He is oriented to person, place, and time. He appears well-developed and well-nourished.  HENT:  Head: Normocephalic and atraumatic.  Eyes: Pupils are equal, round, and reactive to light.  Neck: Normal range of motion. Neck supple.  Cardiovascular: Normal rate.   Respiratory: Effort normal.  GI: Soft. Bowel sounds are normal.  Genitourinary: Penis normal.  Mild Left CVAT  Musculoskeletal: Normal range of motion.  Neurological: He is alert and oriented to person, place, and time.  Skin: Skin is warm and dry.  Psychiatric: He has a normal mood and affect. His behavior is normal. Judgment and thought content normal.     Assessment/Plan  1 - Nephrolithiasis -  We rediscussed ureteroscopic stone manipulation with basketing and laser-lithotripsy in detail.  We rediscussed risks including bleeding, infection, damage to kidney / ureter  bladder, rarely loss of kidney. We rediscussed anesthetic risks and rare but serious surgical complications including DVT, PE, MI, and mortality. We specifically readdressed that in 5-10% of cases a staged approach is required with stenting followed  by re-attempt ureteroscopy if anatomy unfavorable. The patient voiced understanding and wises to proceed today as planned.   I rementioned he will need bilateral stents since bilateral procedure  2 - Microhematuria - s/p multipel negative prior eval's. Reinforced need for repeat eval for future gross episodes.  3 - Lower Urinary Tract Symptoms - continue observation, presently minimal bother.  Archer Vise 04/06/2013, 6:45 AM

## 2013-04-07 NOTE — Op Note (Signed)
[name], [name] NO.:  [id number]  MEDICAL RECORD NO.:  [id number]  LOCATION:  [facility]                         FACILITY:  [facility]  PHYSICIAN:  James Ache, MD     DATE OF BIRTH:  May 13, 1956  DATE OF PROCEDURE: 04/06/2013 DATE OF DISCHARGE:                              OPERATIVE REPORT   DIAGNOSIS:  Left ureteral stone, bilateral renal stones.  PROCEDURE: 1. Cystoscopy, bilateral retrograde pyelograms and  interpretation,     insertion of bilateral ureteral double stents, 6 x 26 upper moiety,     6 x 24 lower moiety, no tether. 2. Left ureteroscopy with laser lithotripsy.  ESTIMATED BLOOD LOSS:  Nil.  COMPLICATIONS:  None.  SPECIMEN:  Left renal ureteral stone for compositional analysis.  FINDINGS: 1. 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 2. Unremarkable urinary bladder.  INDICATION:  James [name] is a pleasant 57 year old gentleman, history of recurrent nephrolithiasis.  He was found on workup of left colicky flank pain to have a left 4-mm ureteral stone as well as bilateral renal stones.  Options were discussed including medical expulsive therapy versus shockwave lithotripsy versus ureteroscopy with goals of treating obstructing stone versus treating for stone free and he wished to proceed with the latter.  Informed consent was obtained and placed in medical record.  DESCRIPTION OF PROCEDURE:  The patient being James [name] was verified. Procedure being bilateral ureteroscopic stone manipulation was confirmed.  Procedure was carried out.  Time-out was performed. Intravenous antibiotics were administered.  General LMA anesthesia was introduced.  The patient was placed into a low lithotomy position. Sterile field was created by prepping and draping the patient's penis, perineum, and proximal thighs using iodine x3.  Next, cystourethroscopy was performed using a 22-French rigid cystoscope with 12-degree offset lens.   Inspection of the bladder revealed no diverticula, calcifications, papular lesions.  He did appear to have 4 ureteral orifices suggesting possible duplication.  This was not evident on previous imaging.  Attention was directed at the left side,  as this was the obstructing side.  The left upper pole moiety ureter which is to the inferior medial ureter was cannulated with a 6-French end-hole catheter and left retrograde pyelogram was obtained.  Left retrograde pyelogram demonstrated upper pole moiety left ureter without filling defects or narrowing.  The 0.038 zip wire was advanced at the level of the upper pole and set aside as a safety wire.  Next, semi-rigid ureteroscopy was performed.  The entire length of the left upper pole moiety ureter, no filling defects or calcifications were found.  Next, a 38 cm 12/14 ureteral access sheath was placed over a sensor working wire to the level of the proximal ureter.  Flexible digital ureteroscopy was performed of the upper pole on the left side. This revealed multifocal papillary tip calcifications, largest of which approximately 3 mm.  All of these were grasped with an escape basket brought out in their entirety and set aside for compositional analysis. Repeat inspection of the entire length of the left upper pole ureter upon withdrawal of the sheath revealed no mucosal abnormalities.  A new 6  x 26 double-J stent was placed over the remaining safety wire.  Good proximal and distal curl were noted.  Attention was directed at the left lower pole ureter.  The left lower pole meatus corresponding to the lateral superior meatus was cannulated with 6-French catheter and left retrograde pyelogram was obtained.  Left retrograde pyelogram demonstrated lower pole left ureter.  There was a filling defect in the ureter consistent with known stone with mild hydroureteronephrosis.  A 0.038 Glidewire was advanced at the level of the lower pole and set aside  for safety wire.  Next, semi-rigid ureteroscopy was performed of the entire length of the left lower pole ureter and no mucosal abnormalities or calcifications were found.  A 38-cm access sheath was placed in the lower pole aspect of the left ureter over a sensor working wire.  Next, flexible digital ureteroscopy was performed of the proximal left lower pole ureter.  This revealed no mucosal abnormalities.  There was evidence of relative narrowing at this location and angulation which did not allow passage to the kidney as such the 8-French digital ureteroscope was exchanged for the 6-French non-digital ureteroscope was then easily navigated.  This area had allowed visualization of the entire lower pole system. Multifocal papillary tip calcifications as well as a larger stone in the renal pelvis corresponding to likely previous ureteral stone.  This appeared to be too large for simple basketing.  As such, holmium laser energy was applied using settings of 0.5 joules and 5 hertz fragmenting the stone into 2 smaller pieces, which was then removed in their entirety.  In the lower pole, papillary tip calcifications were then grasped brought out in their entirety.  In the mid pole, there was 1 papillary tip calcification which could not be grasped due to angulation and this was ablated using a dusting technique and holmium laser lithotripsy.  Following these maneuvers, all stone fragments larger than 1/3 mm in the lower pole was removed.  The entire length of the ureter was once again inspected upon withdrawing of the sheath and indicated abnormalities were found.  Finally, a new 6 x 24 stent was placed in this lower pole moiety using cystoscopic and fluoroscopic guidance. Good proximal and distal curl were noted.  At this point, the patient had been in lithotomy position for one hour and half.  We had achieved the decompression of the obstructing stone given the bilaterality of the stone, it  was felt that a staged approach would be safest in the elective setting and decision was made to stent the right side to allow for passive dilation. We will discuss the patient whether he wished to proceed with second- stage procedure or not, addressing these at a later date.  As such the right upper pole orifice was selected being the inferior medial  orifice and this was cannulated with 6-French end-hole catheter, and right retrograde pyelogram was obtained.  Right retrograde pyelogram demonstrated a right upper pole moiety ureter without filling defects or narrowing.  A 0.038 Glidewire was advanced over which a new 6 x 26 stent was placed into the right upper pole moiety, and the lower pole moiety orifice was then cannulated to 6- French end-hole catheter, and additional right retrograde pyelogram was obtained.  Right lower pole moiety retrograde pyelogram demonstrated a lower pole complete duplex ureter with no filling defects, narrowing, or hydronephrosis at the lower pole.  A 0.038 Glidewire was advanced and a new 6 x 24 double-J stent was placed on the lower pole  moiety, right side.  Final spot fluoroscopic images revealed excellent placement of 4 stents.  Final visualization of the urinary bladder revealed excellent hemostasis.  No evidence of perforation. Bladder was emptied per cystoscope.  Procedure was then terminated.  The patient tolerated the procedure well.  There were no immediate periprocedural complications.  The patient was taken to postanesthesia care unit in stable condition.          ______________________________ James Ache, MD     TM/MEDQ  D:  04/06/2013  T:  04/07/2013  Job:  [id number]

## 2013-04-09 ENCOUNTER — Encounter (HOSPITAL_COMMUNITY): Payer: Self-pay | Admitting: Urology

## 2013-04-10 ENCOUNTER — Encounter (HOSPITAL_COMMUNITY): Payer: Self-pay

## 2013-04-10 ENCOUNTER — Inpatient Hospital Stay (HOSPITAL_COMMUNITY)
Admission: EM | Admit: 2013-04-10 | Discharge: 2013-04-13 | DRG: 988 | Disposition: A | Discharge status: Home / Self Care | Payer: 59 | Attending: Endocrinology | Admitting: Endocrinology

## 2013-04-10 DIAGNOSIS — R319 Hematuria, unspecified: Secondary | ICD-10-CM | POA: Diagnosis present

## 2013-04-10 DIAGNOSIS — E1065 Type 1 diabetes mellitus with hyperglycemia: Secondary | ICD-10-CM | POA: Diagnosis present

## 2013-04-10 DIAGNOSIS — Z833 Family history of diabetes mellitus: Secondary | ICD-10-CM

## 2013-04-10 DIAGNOSIS — Z791 Long term (current) use of non-steroidal anti-inflammatories (NSAID): Secondary | ICD-10-CM

## 2013-04-10 DIAGNOSIS — Z794 Long term (current) use of insulin: Secondary | ICD-10-CM

## 2013-04-10 DIAGNOSIS — Z91041 Radiographic dye allergy status: Secondary | ICD-10-CM

## 2013-04-10 DIAGNOSIS — E46 Unspecified protein-calorie malnutrition: Secondary | ICD-10-CM | POA: Diagnosis present

## 2013-04-10 DIAGNOSIS — I2510 Atherosclerotic heart disease of native coronary artery without angina pectoris: Secondary | ICD-10-CM | POA: Diagnosis present

## 2013-04-10 DIAGNOSIS — Z01812 Encounter for preprocedural laboratory examination: Secondary | ICD-10-CM

## 2013-04-10 DIAGNOSIS — Z9861 Coronary angioplasty status: Secondary | ICD-10-CM

## 2013-04-10 DIAGNOSIS — I10 Essential (primary) hypertension: Secondary | ICD-10-CM | POA: Diagnosis present

## 2013-04-10 DIAGNOSIS — E1039 Type 1 diabetes mellitus with other diabetic ophthalmic complication: Secondary | ICD-10-CM | POA: Diagnosis present

## 2013-04-10 DIAGNOSIS — E11359 Type 2 diabetes mellitus with proliferative diabetic retinopathy without macular edema: Secondary | ICD-10-CM | POA: Diagnosis present

## 2013-04-10 DIAGNOSIS — N320 Bladder-neck obstruction: Secondary | ICD-10-CM | POA: Diagnosis present

## 2013-04-10 DIAGNOSIS — E860 Dehydration: Secondary | ICD-10-CM

## 2013-04-10 DIAGNOSIS — N058 Unspecified nephritic syndrome with other morphologic changes: Secondary | ICD-10-CM | POA: Diagnosis present

## 2013-04-10 DIAGNOSIS — E1029 Type 1 diabetes mellitus with other diabetic kidney complication: Secondary | ICD-10-CM | POA: Diagnosis present

## 2013-04-10 DIAGNOSIS — E1010 Type 1 diabetes mellitus with ketoacidosis without coma: Principal | ICD-10-CM | POA: Diagnosis present

## 2013-04-10 DIAGNOSIS — G5600 Carpal tunnel syndrome, unspecified upper limb: Secondary | ICD-10-CM | POA: Diagnosis present

## 2013-04-10 DIAGNOSIS — D649 Anemia, unspecified: Secondary | ICD-10-CM | POA: Diagnosis present

## 2013-04-10 DIAGNOSIS — F17200 Nicotine dependence, unspecified, uncomplicated: Secondary | ICD-10-CM | POA: Diagnosis present

## 2013-04-10 DIAGNOSIS — E109 Type 1 diabetes mellitus without complications: Secondary | ICD-10-CM

## 2013-04-10 DIAGNOSIS — N529 Male erectile dysfunction, unspecified: Secondary | ICD-10-CM | POA: Diagnosis present

## 2013-04-10 DIAGNOSIS — N200 Calculus of kidney: Secondary | ICD-10-CM | POA: Diagnosis present

## 2013-04-10 DIAGNOSIS — IMO0002 Reserved for concepts with insufficient information to code with codable children: Secondary | ICD-10-CM

## 2013-04-10 DIAGNOSIS — Z79899 Other long term (current) drug therapy: Secondary | ICD-10-CM

## 2013-04-10 DIAGNOSIS — J45909 Unspecified asthma, uncomplicated: Secondary | ICD-10-CM | POA: Diagnosis present

## 2013-04-10 DIAGNOSIS — E871 Hypo-osmolality and hyponatremia: Secondary | ICD-10-CM | POA: Diagnosis present

## 2013-04-10 DIAGNOSIS — F411 Generalized anxiety disorder: Secondary | ICD-10-CM | POA: Diagnosis present

## 2013-04-10 DIAGNOSIS — E785 Hyperlipidemia, unspecified: Secondary | ICD-10-CM | POA: Diagnosis present

## 2013-04-10 DIAGNOSIS — R739 Hyperglycemia, unspecified: Secondary | ICD-10-CM

## 2013-04-10 DIAGNOSIS — N289 Disorder of kidney and ureter, unspecified: Secondary | ICD-10-CM | POA: Diagnosis present

## 2013-04-10 DIAGNOSIS — F329 Major depressive disorder, single episode, unspecified: Secondary | ICD-10-CM | POA: Diagnosis present

## 2013-04-10 DIAGNOSIS — K3184 Gastroparesis: Secondary | ICD-10-CM | POA: Diagnosis present

## 2013-04-10 DIAGNOSIS — F3289 Other specified depressive episodes: Secondary | ICD-10-CM | POA: Diagnosis present

## 2013-04-10 LAB — GLUCOSE, CAPILLARY: Glucose-Capillary: 456 mg/dL — ABNORMAL HIGH (ref 70–99)

## 2013-04-10 NOTE — ED Notes (Signed)
Per EMS: Pt has laser to blast kidney stone Friday 9/19. Since procedure pt has had increase in pain, decrease urine out put, no bowel mvmt. Decrease in intake of fluid. In last 24 hours pt has taken 16 5-325 percocet. Blood sugar of 477. Family reports different times for when last 12 units of insulin was given. Pt also reports nausea.

## 2013-04-10 NOTE — ED Notes (Signed)
Bed: AV40 Expected date: 04/10/13 Expected time: 10:07 PM Means of arrival: Ambulance Comments: 57 yo flank pain s/p lithotripsy

## 2013-04-11 ENCOUNTER — Other Ambulatory Visit: Payer: Self-pay

## 2013-04-11 ENCOUNTER — Encounter (HOSPITAL_COMMUNITY): Payer: Self-pay

## 2013-04-11 ENCOUNTER — Inpatient Hospital Stay (HOSPITAL_COMMUNITY): Payer: 59

## 2013-04-11 LAB — URINALYSIS, ROUTINE W REFLEX MICROSCOPIC
Protein, ur: >300 mg/dL — AB
Urobilinogen, UA: 0.2 mg/dL (ref 0.0–1.0)

## 2013-04-11 LAB — BASIC METABOLIC PANEL
BUN: 19 mg/dL (ref 6–23)
CO2: 14 mEq/L — ABNORMAL LOW (ref 19–32)
CO2: 21 mEq/L (ref 19–32)
CO2: 18 mEq/L — ABNORMAL LOW (ref 19–32)
Calcium: 9.3 mg/dL (ref 8.4–10.5)
Calcium: 8.5 mg/dL (ref 8.4–10.5)
Calcium: 8.3 mg/dL — ABNORMAL LOW (ref 8.4–10.5)
Calcium: 8.1 mg/dL — ABNORMAL LOW (ref 8.4–10.5)
Chloride: 100 mEq/L (ref 96–112)
Chloride: 99 mEq/L (ref 96–112)
Creatinine, Ser: 1.08 mg/dL (ref 0.50–1.35)
Creatinine, Ser: 0.91 mg/dL (ref 0.50–1.35)
Creatinine, Ser: 0.75 mg/dL (ref 0.50–1.35)
GFR calc Af Amer: 86 mL/min — ABNORMAL LOW (ref >90)
GFR calc Af Amer: >90 mL/min (ref >90)
GFR calc Af Amer: >90 mL/min (ref >90)
GFR calc non Af Amer: 53 mL/min — ABNORMAL LOW (ref >90)
GFR calc non Af Amer: 74 mL/min — ABNORMAL LOW (ref >90)
Potassium: 4.1 mEq/L (ref 3.5–5.1)
Sodium: 127 mEq/L — ABNORMAL LOW (ref 135–145)
Sodium: 128 mEq/L — ABNORMAL LOW (ref 135–145)

## 2013-04-11 LAB — GLUCOSE, CAPILLARY
Glucose-Capillary: 340 mg/dL — ABNORMAL HIGH (ref 70–99)
Glucose-Capillary: 297 mg/dL — ABNORMAL HIGH (ref 70–99)
Glucose-Capillary: 260 mg/dL — ABNORMAL HIGH (ref 70–99)
Glucose-Capillary: 225 mg/dL — ABNORMAL HIGH (ref 70–99)
Glucose-Capillary: 144 mg/dL — ABNORMAL HIGH (ref 70–99)
Glucose-Capillary: 104 mg/dL — ABNORMAL HIGH (ref 70–99)
Glucose-Capillary: 134 mg/dL — ABNORMAL HIGH (ref 70–99)
Glucose-Capillary: 163 mg/dL — ABNORMAL HIGH (ref 70–99)
Glucose-Capillary: 239 mg/dL — ABNORMAL HIGH (ref 70–99)
Glucose-Capillary: 298 mg/dL — ABNORMAL HIGH (ref 70–99)

## 2013-04-11 LAB — BLOOD GAS, VENOUS
Acid-base deficit: 3.1 mmol/L — ABNORMAL HIGH (ref 0.0–2.0)
Patient temperature: 37
TCO2: 20 mmol/L (ref 0–100)
pCO2, Ven: 41.8 mmHg — ABNORMAL LOW (ref 45.0–50.0)
pH, Ven: 7.341 — ABNORMAL HIGH (ref 7.250–7.300)
pO2, Ven: 0 mmHg — CL (ref 30.0–45.0)

## 2013-04-11 LAB — CBC
MCH: 32.8 pg (ref 26.0–34.0)
Platelets: 283 10*3/uL (ref 150–400)
RBC: 4.85 MIL/uL (ref 4.22–5.81)

## 2013-04-11 LAB — TROPONIN I: Troponin I: <0.3 ng/mL (ref <0.30)

## 2013-04-11 MED ORDER — INSULIN GLARGINE 100 UNIT/ML ~~LOC~~ SOLN
25.0000 [IU] | Freq: Once | SUBCUTANEOUS | Status: AC
  Administered 2013-04-11: 25 [IU] via SUBCUTANEOUS
  Filled 2013-04-11 (×2): qty 0.25

## 2013-04-11 MED ORDER — SODIUM CHLORIDE 0.9 % IJ SOLN
3.0000 mL | Freq: Two times a day (BID) | INTRAMUSCULAR | Status: DC
  Administered 2013-04-12: 3 mL via INTRAVENOUS

## 2013-04-11 MED ORDER — TAMSULOSIN HCL 0.4 MG PO CAPS
0.4000 mg | ORAL_CAPSULE | Freq: Every day | ORAL | Status: DC
Start: 2013-04-11 — End: 2013-04-13
  Administered 2013-04-11 – 2013-04-12 (×2): 0.4 mg via ORAL
  Filled 2013-04-11 (×4): qty 1

## 2013-04-11 MED ORDER — INSULIN ASPART 100 UNIT/ML ~~LOC~~ SOLN
0.0000 [IU] | Freq: Every day | SUBCUTANEOUS | Status: DC
  Administered 2013-04-11 – 2013-04-12 (×2): 3 [IU] via SUBCUTANEOUS

## 2013-04-11 MED ORDER — SODIUM CHLORIDE 0.9 % IV SOLN
INTRAVENOUS | Status: DC
  Administered 2013-04-11: 2.8 [IU]/h via INTRAVENOUS
  Filled 2013-04-11: qty 1

## 2013-04-11 MED ORDER — OXYBUTYNIN CHLORIDE 5 MG PO TABS
5.0000 mg | ORAL_TABLET | Freq: Three times a day (TID) | ORAL | Status: DC | PRN
  Administered 2013-04-12: 5 mg via ORAL
  Filled 2013-04-11: qty 1

## 2013-04-11 MED ORDER — PANTOPRAZOLE SODIUM 40 MG PO TBEC
40.0000 mg | DELAYED_RELEASE_TABLET | Freq: Every day | ORAL | Status: DC
  Administered 2013-04-11 – 2013-04-13 (×3): 40 mg via ORAL
  Filled 2013-04-11 (×4): qty 1

## 2013-04-11 MED ORDER — DEXTROSE-NACL 5-0.9 % IV SOLN
INTRAVENOUS | Status: DC
  Administered 2013-04-11 (×2): via INTRAVENOUS

## 2013-04-11 MED ORDER — POTASSIUM CHLORIDE IN NACL 20-0.9 MEQ/L-% IV SOLN
INTRAVENOUS | Status: DC
  Administered 2013-04-11: 10:00:00 via INTRAVENOUS
  Filled 2013-04-11 (×2): qty 1000

## 2013-04-11 MED ORDER — ONDANSETRON HCL 4 MG PO TABS: 4.0000 mg | ORAL_TABLET | Freq: Four times a day (QID) | ORAL | Status: DC | PRN

## 2013-04-11 MED ORDER — MECLIZINE HCL 25 MG PO TABS
25.0000 mg | ORAL_TABLET | Freq: Three times a day (TID) | ORAL | Status: DC | PRN
  Filled 2013-04-11: qty 1

## 2013-04-11 MED ORDER — INSULIN GLARGINE 100 UNIT/ML ~~LOC~~ SOLN
20.0000 [IU] | Freq: Every day | SUBCUTANEOUS | Status: DC
  Administered 2013-04-11: 20 [IU] via SUBCUTANEOUS
  Filled 2013-04-11 (×3): qty 0.2

## 2013-04-11 MED ORDER — SODIUM CHLORIDE 0.9 % IV BOLUS (SEPSIS)
1000.0000 mL | Freq: Once | INTRAVENOUS | Status: AC
  Administered 2013-04-11: 1000 mL via INTRAVENOUS

## 2013-04-11 MED ORDER — LORAZEPAM 1 MG PO TABS
1.0000 mg | ORAL_TABLET | Freq: Once | ORAL | Status: AC
  Administered 2013-04-11: 1 mg via ORAL
  Filled 2013-04-11: qty 1

## 2013-04-11 MED ORDER — SODIUM CHLORIDE 0.9 % IV SOLN
INTRAVENOUS | Status: DC
  Administered 2013-04-11: 5 [IU]/h via INTRAVENOUS
  Filled 2013-04-11: qty 1

## 2013-04-11 MED ORDER — GI COCKTAIL ~~LOC~~
30.0000 mL | Freq: Once | ORAL | Status: AC
  Administered 2013-04-11: 30 mL via ORAL
  Filled 2013-04-11: qty 30

## 2013-04-11 MED ORDER — INSULIN ASPART 100 UNIT/ML ~~LOC~~ SOLN
0.0000 [IU] | Freq: Three times a day (TID) | SUBCUTANEOUS | Status: DC
  Administered 2013-04-11: 8 [IU] via SUBCUTANEOUS
  Administered 2013-04-12 – 2013-04-13 (×2): 5 [IU] via SUBCUTANEOUS
  Administered 2013-04-13: 11 [IU] via SUBCUTANEOUS

## 2013-04-11 MED ORDER — ALBUTEROL SULFATE HFA 108 (90 BASE) MCG/ACT IN AERS: 2.0000 | INHALATION_SPRAY | Freq: Four times a day (QID) | RESPIRATORY_TRACT | Status: DC | PRN

## 2013-04-11 MED ORDER — OXYCODONE-ACETAMINOPHEN 5-325 MG PO TABS
1.0000 | ORAL_TABLET | Freq: Four times a day (QID) | ORAL | Status: DC | PRN
  Administered 2013-04-11: 2 via ORAL
  Filled 2013-04-11: qty 2

## 2013-04-11 MED ORDER — ONDANSETRON HCL 4 MG/2ML IJ SOLN
4.0000 mg | Freq: Four times a day (QID) | INTRAMUSCULAR | Status: DC | PRN
  Administered 2013-04-12: 4 mg via INTRAVENOUS
  Filled 2013-04-11: qty 2

## 2013-04-11 MED ORDER — TRAZODONE HCL 150 MG PO TABS
150.0000 mg | ORAL_TABLET | Freq: Every day | ORAL | Status: DC
  Administered 2013-04-11 – 2013-04-12 (×2): 150 mg via ORAL
  Filled 2013-04-11 (×4): qty 1

## 2013-04-11 MED ORDER — INSULIN ASPART 100 UNIT/ML ~~LOC~~ SOLN
3.0000 [IU] | Freq: Three times a day (TID) | SUBCUTANEOUS | Status: DC
  Administered 2013-04-11 – 2013-04-13 (×4): 3 [IU] via SUBCUTANEOUS

## 2013-04-11 MED ORDER — POLYETHYLENE GLYCOL 3350 17 G PO PACK
17.0000 g | PACK | Freq: Every day | ORAL | Status: DC | PRN
  Filled 2013-04-11: qty 1

## 2013-04-11 MED ORDER — SIMVASTATIN 40 MG PO TABS
40.0000 mg | ORAL_TABLET | Freq: Every evening | ORAL | Status: DC
  Administered 2013-04-11 – 2013-04-12 (×2): 40 mg via ORAL
  Filled 2013-04-11 (×3): qty 1

## 2013-04-11 MED ORDER — SODIUM CHLORIDE 0.9 % IV SOLN: INTRAVENOUS | Status: DC

## 2013-04-11 NOTE — Progress Notes (Signed)
His case was discussed with his urologist, Dr. Dwana Curd, who is available to do his planned urologic procedure tomorrow afternoon for nephrolithiasis. Dr. Berneice Heinrich is aware that he took a large amount of valium and narcotics and indicated that he would make sure that the anesthesiologist is well aware of this prior to any planned surgery. We will see what his PCP feels regarding his suitability for this surgery in the morning.

## 2013-04-11 NOTE — H&P (Signed)
Physician Admission History and Physical     PCP:   Julian Hy, MD   Chief Complaint:  DKA  HPI: James [name] is an 57 y.o. male.  Pt w/ Type 1 DM of Dr Lyndle Herrlich who has been having decreased control of his DM since placement of B/L uretal stents and L lithotripsy on Friday. He presents to the ED in DKA w/ dehydration w/ gluc 449, AG 28, HCO3 14, ketonuria, BUN 32, Cr 1.42, GFR 53, lactate 2.85.   Subjectively difficult and conflicting story b/w me and nurses. Question of whether he took 20+ percocet in past 24 hours. Question of whether taking insulin given he is not eating and is in pain (he told me he has not taken any insulin). He has pain in his penis and abdomen. He has decreased urinary output and mainly blood in his urine per his report to me.    Review of Systems:  Neg except as noted above   Past History Past Medical History (reviewed - no changes required): DKA 2012 DM hypoglycemic seizure 08/13 DM Dx at age 57   1. Nausea and vomiting, thought to be secondary to gastroenteritis,      resolved.   2. Early diabetic ketoacidosis, resolved.   [name]  DIABETES MELLITUS, TYPE I  04/03/2007   .  DM W/RENAL MNFST, TYPE I, UNCONTROLLED  04/04/2007   .  DIABETIC RETINOPATHY, PROLIFERATIVE  04/03/2007   .  DM W/EYE MANIFESTATIONS, TYPE I, UNCONTROLLED  04/04/2007   .  HYPERLIPIDEMIA  04/04/2007   .  ANXIETY  04/03/2007   .  DEPRESSION  04/03/2007   .  CARPAL TUNNEL SYNDROME, BILATERAL  07/31/2007   .  CORONARY ARTERY DISEASE  04/03/2007   .  ASTHMATIC BRONCHITIS, ACUTE  10/25/2008   .  ASTHMA  09/06/2008   .  RENAL INSUFFICIENCY  07/31/2007   .  CHEST PAIN  10/25/2008   .  Renal insufficiency     .  ED (erectile dysfunction)     .  Cervical disc disease     .  Bladder neck obstruction   CAD: 2004:  1. The left main coronary artery:  The left main coronary artery was free o f   significant disease.  2. Left anterior descending:  The left anterior descending artery gave ris    to two septal  perforators and two diagonal branches. There was some    irregularity in the LAD but no significant obstruction. 3. Circumflex artery:  The circumflex artery was a large dominant vesse    that gave rise to two marginal branches, two posterolateral branches, and    a posterior descending branch.  There was some irregularity in the AV    portion of the circumflex artery but no significant obstruction. 4. Right coronary artery:  The right coronary was a small nondominant vessel  LEFT VENTRICULOGRAM:  The left ventriculogram performed in the RAO projection showed good wall motion with no areas of hypokinesis.  The estimated ejection fraction was 60%. Gastroparesis 07/12: NM Gastric Emptying -   colon check 12/25/2010, normal mucosa on bx kidney stone 2014  I WILL BE PRIMARY Surgical History (reviewed - no changes required): C-4 Fusion 2000 2001: C6-7 anterior cervical diskectomy and fusion. Appendectomy 1969 Tonsillectomy 1960  1. Appendectomy.   2. Cervical diskectomy with screw implant.   3. Tonsillectomy.   4. Groin lymph node biopsy.  2008:  Pars plana vitrectomy with 25 gauge system, retinal  photocoagulation, membrane  peel of cellular membrane, left eye.   2009:  Pars plana vitrectomy with membrane peel, retinal  photocoagulation, and gas injection, all right eye.  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 Authorizing Provider  acetaminophen (TYLENOL) 500 MG tablet Take  1,000 mg by mouth every 6 (six) hours as needed for pain.   Yes Historical Provider, MD  albuterol (PROVENTIL HFA;VENTOLIN HFA) 108 (90 BASE) MCG/ACT inhaler Inhale 2 puffs into the lungs every 6 (six) hours as needed for wheezing or shortness of breath.   Yes Historical Provider, MD  escitalopram (LEXAPRO) 20 MG tablet Take 20 mg by mouth every evening.    Yes Historical Provider, MD  ibuprofen (ADVIL,MOTRIN) 200 MG tablet Take 200 mg by mouth every 6 (six) hours as needed for pain.    Yes Historical Provider, MD  insulin glargine (LANTUS) 100 UNIT/ML injection Inject 40 Units into the skin at bedtime.    Yes Historical Provider, MD  insulin lispro (HUMALOG KWIKPEN) 100 unit/mL SOLN Inject 1-25 Units into the skin as directed. Pt uses per sliding scale and checks blood sugar 4-5 times daily BS 120 every 20 above 120 give 1 unit.   Yes Historical Provider, MD  ketorolac (TORADOL) 10 MG tablet Take 10 mg by mouth every 6 (six) hours as needed for pain.   Yes Historical Provider, MD  meclizine (ANTIVERT) 25 MG tablet Take 25 mg by mouth 3 (three) times daily as needed for nausea.   Yes Historical Provider, MD  Meth-Hyo-M Bl-Na Phos-Ph Sal (URIBEL) 118 MG CAPS Take by mouth.   Yes Historical Provider, MD  oxyCODONE-acetaminophen (ROXICET) 5-325 MG per tablet Take 1-2 tablets by mouth every 6 (six) hours as needed for pain. Post-operatively 04/06/13  Yes Sebastian Ache, MD  simvastatin (ZOCOR) 40 MG tablet Take 40 mg by mouth every evening.   Yes Historical Provider, MD  traZODone (DESYREL) 100 MG tablet Take 150 mg by mouth at bedtime.    Yes Historical Provider, MD  varenicline (CHANTIX) 1 MG tablet Take 1 mg by mouth 2 (two) times daily.    Yes Historical Provider, MD  oxybutynin (DITROPAN) 5 MG tablet Take 1 tablet (5 mg total) by mouth every 8 (eight) hours as needed. For bladder spasms / stent discomfort. 04/06/13   Sebastian Ache, MD  senna-docusate (SENOKOT-S) 8.6-50 MG per tablet Take 1 tablet by  mouth 2 (two) times daily. While taking pain meds to prevent constipation 04/06/13   Sebastian Ache, MD  tamsulosin (FLOMAX) 0.4 MG CAPS capsule Take 0.4 mg by mouth daily after supper.    Historical Provider, MD    Allergies:   Allergies  Allergen Reactions  . Iohexol      Code: HIVES, Desc: PATIENT STATES HE IS ALLERGIC TO IV DYE, nervousness, dizziness, light sweat     Physical Exam: Filed Vitals:   04/10/13 2234 04/11/13 0010  BP: 132/64   Pulse: 104   Temp: 98.2 F (36.8 C) 98.5 F (36.9 C)  TempSrc: Oral Oral  Resp: 24  SpO2: 98%    Gen: unpleasant WM in NAD. AAOx4  HEENT: anicteric sclera. PERRL. EOMI.  Lungs: CTAB. No wheezes or rales  Cardio: RRR,no MRG. 2+ DP pulses B/L   Abd: TTP in B/L Lower quandrants. No masses. Normal BS  MSK: 5/5 strength in UE/LE  Neuro:  Normal reflexes  Skin: no jaundice. Dry and warm extremities     Labs on Admission:   Recent Labs  04/10/13 2320  NA 127*  K 4.9  CL 85*  CO2 14*  GLUCOSE 449*  BUN 32*  CREATININE 1.42*  CALCIUM 9.3   No results found for this basename: AST, ALT, ALKPHOS, BILITOT, PROT, ALBUMIN,  in the last 72 hours No results found for this basename: LIPASE, AMYLASE,  in the last 72 hours  Recent Labs  04/10/13 2320  WBC 9.4  HGB 15.9  HCT 43.3  MCV 89.3  PLT 283    Recent Labs  04/10/13 2320  TROPONINI <0.30   No results found for this basename: INR, PROTIME   No results found for this basename: TSH, T4TOTAL, FREET3, T3FREE, THYROIDAB,  in the last 72 hours No results found for this basename: VITAMINB12, FOLATE, FERRITIN, TIBC, IRON, RETICCTPCT,  in the last 72 hours  Radiological Exams on Admission: No results found. Orders placed during the hospital encounter of 04/10/13  . EKG 12-LEAD  . EKG 12-LEAD    Assessment/Plan  DKA - check VBG on arrival to floor. NS at 125cc/hr + 20 KCl. Insulin ggt. FSBS qhour w/ BMP q6hrs. Will continue insulin ggt until gap closes. Admit to SDU.    Recent lithotripsy and B/L uretal stent placement- oxycodone prn pain. flomax continued. Given he presents w/ AKI and oliguria after stent placement, will order renal U/S to ensure no worsening hydro from obstruction, although dehydration from DKA is most likely cause.   Dispo - admit for 3-5 days until DM stabilized   PPx - SCDs given hematuria and PPI   Lenton Gendreau 04/11/2013, 1:30 AM

## 2013-04-11 NOTE — ED Notes (Signed)
Dr. South at bedside.  

## 2013-04-11 NOTE — ED Notes (Signed)
Maurine Minister, RN asked that I, Kermit Balo, RN charge nurse go in to speak with the pt as he states he is unhappy with his care  Went into pt's room and introduced myself to pt and asked pt if I could speak with him regarding his concerns  Pt said, "No you can leave"  Pt refused to say anything else

## 2013-04-11 NOTE — ED Provider Notes (Signed)
CSN: [id number]     Arrival date & time 04/10/13  2214 History   First MD Initiated Contact with Patient 04/10/13 2317     Chief Complaint  Patient presents with  . Abdominal Pain  . Nausea  . Hyperglycemia   (Consider location/radiation/quality/duration/timing/severity/associated sxs/prior Treatment) Patient is a 57 y.o. male presenting with abdominal pain and hyperglycemia. The history is provided by the patient and medical records. No language interpreter was used.  Abdominal Pain Associated symptoms: constipation, dysuria, fatigue, hematuria and nausea   Associated symptoms: no chest pain, no cough, no diarrhea, no fever, no shortness of breath and no vomiting   Hyperglycemia Associated symptoms: abdominal pain (suprapubic), dysuria, fatigue and nausea   Associated symptoms: no chest pain, no diaphoresis, no fever, no increased thirst, no polyuria, no shortness of breath and no vomiting     James [name] is a 57 y.o. male  with a hx of diabetes, anxiety, COPD, CAD, renal insufficiency presents to the Emergency Department complaining of gradual, persistent, progressively worsening hyperglycemia and general ill feeling onset this morning. Patient's wife reports that 4 days ago he had lithotripsy and stenting of his ureters to remove a kidney stone. She reports is bilateral kidney stones but only the right was done on Friday.  She reports he has not been eating or in taking much fluid and has been taking many Percocets.  She reports that his blood sugar was high this morning but she does not know how high. Patient reports that his blood sugar was in the mid 400s at 7 PM. At that time he took 12 units of insulin. He reports that at 9 PM when he checks his blood sugar it was 480 therefore he took 12 more units of insulin.  He reports at that time he became concerned about his blood sugar and called 911 because his family would not bring him to the emergency department.. Associated symptoms include  decreased appetite, suprapubic abdominal pain, hematuria, hyperglycemia, nausea, indigestion, constipation.  Nothing makes it better and nothing makes it worse.  Pt denies fever, chills, headache, neck pain, chest pain, shortness of breath, vomiting, diarrhea, dizziness, syncope.     Past Medical History  Diagnosis Date  . DIABETES MELLITUS, TYPE I 04/03/2007  . DIABETIC RETINOPATHY, PROLIFERATIVE 04/03/2007  . DM W/EYE MANIFESTATIONS, TYPE I, UNCONTROLLED 04/04/2007  . HYPERLIPIDEMIA 04/04/2007  . ANXIETY 04/03/2007  . DEPRESSION 04/03/2007  . ASTHMATIC BRONCHITIS, ACUTE 10/25/2008  . ASTHMA 09/06/2008  . ED (erectile dysfunction)   . Cervical disc disease   . Bladder neck obstruction   . COPD (chronic obstructive pulmonary disease)   . Spinal stenosis   . Depression   . Anxiety   . CORONARY ARTERY DISEASE 04/03/2007  . 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 RENAL INSUFFICIENCY 07/31/2007  . Renal insufficiency   . CARPAL TUNNEL SYNDROME, BILATERAL 07/31/2007    issues resolved, no surgery   Past Surgical History  Procedure Laterality Date  . Ptca    . Spine surgery      s/p C-spine disc surgery-?fusion  . Appendectomy    . Stress cardiolite  09/06/2002  . Tonsillectomy    . Lymph node removed from groin  age 2  . Cystoscopy with retrograde pyelogram, ureteroscopy and stent placement Bilateral 04/06/2013    Procedure: BILATERAL CYSTOSCOPY WITH RETROGRADE PYELOGRAMS, STENT PLACEMENTS AND LEFT URETEROSCOPY AND STONE REMOVAL;  Surgeon: Sebastian Ache, MD;  Location: [facility];  Service: Urology;  Laterality: Bilateral;  .  Holmium laser application Left 04/06/2013    Procedure: HOLMIUM LASER APPLICATION;  Surgeon: Sebastian Ache, MD;  Location: [facility];  Service: Urology;  Laterality: Left;   Family History  Problem Relation Age of Onset  . Stroke Father     strong FH cerebrovascular disease  . Diabetes Brother   . Heart disease Brother     CHF  . Colon cancer Neg Hx     History  Substance Use Topics  . Smoking status: Current Every Genet Smoker -- 1.00 packs/Spadaccini for 38 years    Types: Cigarettes  . Smokeless tobacco: Never Used  . Alcohol Use: No    Review of Systems  Constitutional: Positive for fatigue. Negative for fever, diaphoresis, appetite change and unexpected weight change.  HENT: Negative for mouth sores, neck pain and neck stiffness.   Eyes: Negative for visual disturbance.  Respiratory: Negative for cough, chest tightness, shortness of breath and wheezing.   Cardiovascular: Negative for chest pain.  Gastrointestinal: Positive for nausea, abdominal pain (suprapubic) and constipation. Negative for vomiting and diarrhea.  Endocrine: Negative for polydipsia, polyphagia and polyuria.  Genitourinary: Positive for dysuria, hematuria and decreased urine volume. Negative for urgency, frequency, penile swelling, scrotal swelling and testicular pain.  Musculoskeletal: Negative for back pain.  Skin: Negative for rash.  Allergic/Immunologic: Negative for immunocompromised state.  Neurological: Positive for weakness. Negative for syncope, light-headedness and headaches.  Hematological: Does not bruise/bleed easily.  Psychiatric/Behavioral: Negative for sleep disturbance. The patient is not nervous/anxious.     Allergies  Iohexol  Home Medications   Current Outpatient Rx  Name  Route  Sig  Dispense  Refill  . acetaminophen (TYLENOL) 500 MG tablet   Oral   Take 1,000 mg by mouth every 6 (six) hours as needed for pain.         [name] albuterol (PROVENTIL HFA;VENTOLIN HFA) 108 (90 BASE) MCG/ACT inhaler   Inhalation   Inhale 2 puffs into the lungs every 6 (six) hours as needed for wheezing or shortness of breath.         . escitalopram (LEXAPRO) 20 MG tablet   Oral   Take 20 mg by mouth every evening.          [name] ibuprofen (ADVIL,MOTRIN) 200 MG tablet   Oral   Take 200 mg by mouth every 6 (six) hours as needed for pain.          [name]  insulin glargine (LANTUS) 100 UNIT/ML injection   Subcutaneous   Inject 40 Units into the skin at bedtime.          . insulin lispro (HUMALOG KWIKPEN) 100 unit/mL SOLN   Subcutaneous   Inject 1-25 Units into the skin as directed. Pt uses per sliding scale and checks blood sugar 4-5 times daily BS 120 every 20 above 120 give 1 unit.         [name] ketorolac (TORADOL) 10 MG tablet   Oral   Take 10 mg by mouth every 6 (six) hours as needed for pain.         . meclizine (ANTIVERT) 25 MG tablet   Oral   Take 25 mg by mouth 3 (three) times daily as needed for nausea.         . Meth-Hyo-M Bl-Na Phos-Ph Sal (URIBEL) 118 MG CAPS   Oral   Take by mouth.         . oxyCODONE-acetaminophen (ROXICET) 5-325 MG per tablet   Oral   Take 1-2 tablets by  mouth every 6 (six) hours as needed for pain. Post-operatively   50 tablet   0   . simvastatin (ZOCOR) 40 MG tablet   Oral   Take 40 mg by mouth every evening.         . traZODone (DESYREL) 100 MG tablet   Oral   Take 150 mg by mouth at bedtime.          . varenicline (CHANTIX) 1 MG tablet   Oral   Take 1 mg by mouth 2 (two) times daily.          [name] oxybutynin (DITROPAN) 5 MG tablet   Oral   Take 1 tablet (5 mg total) by mouth every 8 (eight) hours as needed. For bladder spasms / stent discomfort.   30 tablet   1   . senna-docusate (SENOKOT-S) 8.6-50 MG per tablet   Oral   Take 1 tablet by mouth 2 (two) times daily. While taking pain meds to prevent constipation   30 tablet   1   . tamsulosin (FLOMAX) 0.4 MG CAPS capsule   Oral   Take 0.4 mg by mouth daily after supper.          BP 132/64  Pulse 104  Temp(Src) 98.5 F (36.9 C) (Oral)  Resp 24  SpO2 98% Physical Exam  Nursing note and vitals reviewed. Constitutional: He is oriented to person, place, and time. He appears well-developed and well-nourished. No distress.  Awake, alert, ill-appearing, uncomfortable  HENT:  Head: Normocephalic and atraumatic.   Mouth/Throat: Oropharynx is clear and moist. No oropharyngeal exudate.  Eyes: Conjunctivae are normal. No scleral icterus.  Neck: Normal range of motion. Neck supple.  Cardiovascular: Normal rate, regular rhythm and intact distal pulses.   Pulmonary/Chest: Effort normal and breath sounds normal. No respiratory distress. He has no wheezes.  Abdominal: Soft. Normal appearance and bowel sounds are normal. He exhibits no distension and no mass. There is tenderness in the suprapubic area. There is no rigidity, no rebound, no guarding and no CVA tenderness.    Suprapubic tenderness No CVA tenderness  Musculoskeletal: Normal range of motion. He exhibits no edema.  Neurological: He is alert and oriented to person, place, and time. He exhibits normal muscle tone. Coordination normal.  Speech is clear and goal oriented Moves extremities without ataxia  Skin: Skin is warm. He is diaphoretic. There is pallor.  Psychiatric: He has a normal mood and affect. His behavior is normal.    ED Course  Procedures (including critical care time) Labs Review Labs Reviewed  GLUCOSE, CAPILLARY - Abnormal; Notable for the following:    Glucose-Capillary 456 (*)    All other components within normal limits  CBC - Abnormal; Notable for the following:    MCHC 36.7 (*)    All other components within normal limits  URINALYSIS, ROUTINE W REFLEX MICROSCOPIC - Abnormal; Notable for the following:    Color, Urine BROWN (*)    APPearance CLOUDY (*)    Glucose, UA >1000 (*)    Hgb urine dipstick LARGE (*)    Ketones, ur >80 (*)    Protein, ur >300 (*)    Leukocytes, UA SMALL (*)    All other components within normal limits  CG4 I-STAT (LACTIC ACID) - Abnormal; Notable for the following:    Lactic Acid, Venous 2.85 (*)    All other components within normal limits  URINE MICROSCOPIC-ADD ON  BASIC METABOLIC PANEL  TROPONIN I   Imaging Review No results found.  ECG:  Date: 04/11/2013  Rate: 95  Rhythm:  normal sinus rhythm  QRS Axis: normal  Intervals: normal  ST/T Wave abnormalities: normal  Conduction Disutrbances:none  Narrative Interpretation: Nonischemic ECG, unchanged from 12/09/2012  Old EKG Reviewed: unchanged    MDM   1. Hyperglycemia   2. Dehydration   3. DIABETES MELLITUS, TYPE I    James [name] presents with dehydration, hyperglycemia and decreased appetite.  Patient reports glucose in the mid 400s at home even after taking 24 units of insulin.    UA with evidence of dehydration but no evidence of urinary tract infection. CBC without leukocytosis and lactic acid 2.5.  BMP pending. Will continued fluid boluses and reassess.  Patient alert and oriented, pain controlled at this time. Afebrile.  1:11 AM Patient with an anion gap of 28 and urine ketones, consistent with DKA. We'll begin glucose stabilizer and admit.  Primary care physician Dr. Evlyn Kanner with [facility] medical.  ECG nonischemic. Troponin negative.  Will admit.  [name]   Dahlia Client Damya Comley, PA-C 04/11/13 [phone]

## 2013-04-11 NOTE — Progress Notes (Addendum)
Inpatient Diabetes Program Recommendations  AACE/ADA: New Consensus Statement on Inpatient Glycemic Control (2013)  Target Ranges:  Prepandial:   less than 140 mg/dL      Peak postprandial:   less than 180 mg/dL (1-2 hours)      Critically ill patients:  140 - 180 mg/dL   Reason for Visit: DKA  57 y.o. male. Pt w/ Type 1 DM of Dr Lyndle Herrlich who has been having decreased control of his DM since placement of B/L uretal stents and L lithotripsy on Friday. Presented to the ED in DKA w/ dehydration w/ gluc 449, AG 28, HCO3 14, ketonuria, BUN 32, Cr 1.42, GFR 53, lactate 2.85.  Question of whether he took 20+ percocet in past 24 hours. Question of whether he was taking insulin given he was not eating and in pain. States he took 12 units of Humalog and blood sugars remained high and then took another 12 units. GlucoStabilizer started in ED and resumed after transfer to floor. Pt states he's hungry because he hasn't eaten in 5 days.  Results for GAELAN, GLENNON (MRN [id number]) as of 04/11/2013 14:22  Ref. Range 04/10/2013 22:33 04/11/2013 01:34 04/11/2013 03:02 04/11/2013 05:14 04/11/2013 06:22 04/11/2013 07:32 04/11/2013 08:39 04/11/2013 09:41 04/11/2013 10:47 04/11/2013 11:44  Glucose-Capillary Latest Range: 70-99 mg/dL 295 (H) 284 (H) 132 (H) 260 (H) 225 (H) 174 (H) 144 (H) 116 (H) 104 (H) 134 (H)  Results for PADDY, NEIS (MRN [id number]) as of 04/11/2013 14:22  Ref. Range 04/11/2013 07:40 04/11/2013 12:30  Sodium Latest Range: 135-145 mEq/L 128 (L) 134 (L)  Potassium Latest Range: 3.5-5.1 mEq/L 4.0 4.1  Chloride Latest Range: 96-112 mEq/L 95 (L) 100  CO2 Latest Range: 19-32 mEq/L 22 21  BUN Latest Range: 6-23 mg/dL 24 (H) 19  Creatinine Latest Range: 0.50-1.35 mg/dL 3.66 4.40  Calcium Latest Range: 8.4-10.5 mg/dL 8.5 8.3 (L)  GFR calc non Af Amer Latest Range: >90 mL/min 74 (L) >90  GFR calc Af Amer Latest Range: >90 mL/min 86 (L) >90  Glucose Latest Range: 70-99 mg/dL 347 (H) 425 (H)  Results for DEUNTA, BENEKE  (MRN [id number]) as of 04/11/2013 14:22  Ref. Range 04/01/2011 05:30  Hemoglobin A1C Latest Range: <5.7 % 8.0 (H)   AG is 13. Lantus given and ready to transition to SQ.   Recommendations: CHO mod diet Meal coverage - Novolog 4 units tidwc Novolog mod tidwc and hs Lantus 40 units Q24 hours. Will likely need Lantus 20 QHS and begin Lantus 40 QHS on 9/25. HgbA1C to assess glycemic control prior to hospitalization  Will f/u in am. Thank you. Ailene Ards, RD, LDN, CDE Inpatient Diabetes Coordinator [phone]

## 2013-04-11 NOTE — Care Management Note (Addendum)
    Page 1 of 1   04/13/2013     12:46:36 PM   CARE MANAGEMENT NOTE 04/13/2013  Patient:  James [name], James [name]   Account Number:  [id number]  Date Initiated:  04/11/2013  Documentation initiated by:  Lanier Clam  Subjective/Objective Assessment:   57 Y/O M ADMITTED W/DM.     Action/Plan:   FROM HOME.HAS PCP,PHARMACY   Anticipated DC Date:  04/13/2013   Anticipated DC Plan:  HOME/SELF CARE      DC Planning Services  CM consult      Choice offered to / List presented to:             Status of service:  Completed, signed off Medicare Important Message given?   (If response is "NO", the following Medicare IM given date fields will be blank) Date Medicare IM given:   Date Additional Medicare IM given:    Discharge Disposition:  HOME/SELF CARE  Per UR Regulation:  Reviewed for med. necessity/level of care/duration of stay  If discussed at Long Length of Stay Meetings, dates discussed:    Comments:  04/13/13 Jerrine Urschel RN,BSN NCM [phone] D/C HOME NO NEEDS OR ORDERS.  04/11/13 Ger Ringenberg RN,BSN NCM [phone]

## 2013-04-11 NOTE — ED Notes (Signed)
Pt's cbg 116, no order for dextrose or lantus as is usually given with insulin drip. Consulted with pharmacist and EDP; waiting for Dr Evlyn Kanner to call back.

## 2013-04-11 NOTE — ED Notes (Signed)
Pt angry and upset stating that he is not receiving good care and wishes to leave. Pt sitting in chair not in bed when last visiting the pt. Pt requested a wheelchair to leave. Explained to pt he was not safe to go home and that he needed medical care. Pt disregarded all attempts to explain why he need to stay and was firm with leaving facility. CN and EDP made aware of pt request.

## 2013-04-11 NOTE — Consult Note (Signed)
Reason for Consult: Nephrolithiasis,  Referring Physician: Adrian Prince MD  James [name] is an 57 y.o. male.   HPI:   1 - Nephrolithiasis - Pt s/p left ureteroscopic stone manipulation of upper and lower pole moeity ureters 04/06/13 and bilateral stenting (4 stents toal) for eft 4mm UVJ stone + small scattered bilateral intrarenal stones (all <21mm) found on CT in ER on w/u flank pain. Two prior epsidoes 1984 and 1985 passed spontaneously.   PT tentatively scheduled for right sided ureteroscopic stone manipulation and left sided stent removal tomorrow (9/25) and now admitted for mild DKA and possible near over-dose from narcotic and benzodiazapine medications prescribed to help with his significant pain from having the 4 stents in situ.   Today [name] is seen in consultation for above, specifically to address timing of next surgery. His metabolic parameters and mental status have all greatly improved throughout the [place].   Past Medical History  Diagnosis Date  . DIABETES MELLITUS, TYPE I 04/03/2007  . DIABETIC RETINOPATHY, PROLIFERATIVE 04/03/2007  . DM W/EYE MANIFESTATIONS, TYPE I, UNCONTROLLED 04/04/2007  . HYPERLIPIDEMIA 04/04/2007  . ANXIETY 04/03/2007  . DEPRESSION 04/03/2007  . ASTHMATIC BRONCHITIS, ACUTE 10/25/2008  . ASTHMA 09/06/2008  . ED (erectile dysfunction)   . Cervical disc disease   . Bladder neck obstruction   . COPD (chronic obstructive pulmonary disease)   . Spinal stenosis   . Depression   . Anxiety   . CORONARY ARTERY DISEASE 04/03/2007  . 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 RENAL INSUFFICIENCY 07/31/2007  . Renal insufficiency   . CARPAL TUNNEL SYNDROME, BILATERAL 07/31/2007    issues resolved, no surgery    Past Surgical History  Procedure Laterality Date  . Ptca    . Spine surgery      s/p C-spine disc surgery-?fusion  . Appendectomy    . Stress cardiolite  09/06/2002  . Tonsillectomy    . Lymph node removed from groin  age 26  . Cystoscopy with  retrograde pyelogram, ureteroscopy and stent placement Bilateral 04/06/2013    Procedure: BILATERAL CYSTOSCOPY WITH RETROGRADE PYELOGRAMS, STENT PLACEMENTS AND LEFT URETEROSCOPY AND STONE REMOVAL;  Surgeon: James Ache, MD;  Location: [facility];  Service: Urology;  Laterality: Bilateral;  . Holmium laser application Left 04/06/2013    Procedure: HOLMIUM LASER APPLICATION;  Surgeon: James Ache, MD;  Location: [facility];  Service: Urology;  Laterality: Left;    Family History  Problem Relation Age of Onset  . Stroke Father     strong FH cerebrovascular disease  . Diabetes Brother   . Heart disease Brother     CHF  . Colon cancer Neg Hx     Social History:  reports that he has been smoking Cigarettes.  He has a 38 pack-year smoking history. He has never used smokeless tobacco. He reports that he does not drink alcohol or use illicit drugs.  Allergies:  Allergies  Allergen Reactions  . Iohexol      Code: HIVES, Desc: PATIENT STATES HE IS ALLERGIC TO IV DYE, nervousness, dizziness, light sweat   . Lexapro [Escitalopram Oxalate] Itching    Medications: I have reviewed the patient's current medications.  Results for orders placed during the hospital encounter of 04/10/13 (from the past 48 hour(s))  GLUCOSE, CAPILLARY     Status: Abnormal   Collection Time    04/10/13 10:33 PM      Result Value Range   Glucose-Capillary 456 (*) 70 - 99 mg/dL   Comment  1 Documented in Chart     Comment 2 Notify RN    CBC     Status: Abnormal   Collection Time    04/10/13 11:20 PM      Result Value Range   WBC 9.4  4.0 - 10.5 K/uL   RBC 4.85  4.22 - 5.81 MIL/uL   Hemoglobin 15.9  13.0 - 17.0 g/dL   HCT 16.1  09.6 - 04.5 %   MCV 89.3  78.0 - 100.0 fL   MCH 32.8  26.0 - 34.0 pg   MCHC 36.7 (*) 30.0 - 36.0 g/dL   RDW 40.9  81.1 - 91.4 %   Platelets 283  150 - 400 K/uL  BASIC METABOLIC PANEL     Status: Abnormal   Collection Time    04/10/13 11:20 PM      Result Value Range   Sodium 127 (*) 135  - 145 mEq/L   Potassium 4.9  3.5 - 5.1 mEq/L   Chloride 85 (*) 96 - 112 mEq/L   CO2 14 (*) 19 - 32 mEq/L   Glucose, Bld 449 (*) 70 - 99 mg/dL   BUN 32 (*) 6 - 23 mg/dL   Creatinine, Ser 7.82 (*) 0.50 - 1.35 mg/dL   Calcium 9.3  8.4 - 95.6 mg/dL   GFR calc non Af Amer 53 (*) >90 mL/min   GFR calc Af Amer 61 (*) >90 mL/min   Comment: (NOTE)     The eGFR has been calculated using the CKD EPI equation.     This calculation has not been validated in all clinical situations.     eGFR's persistently <90 mL/min signify possible Chronic Kidney     Disease.  TROPONIN I     Status: None   Collection Time    04/10/13 11:20 PM      Result Value Range   Troponin I <0.30  <0.30 ng/mL   Comment:            Due to the release kinetics of cTnI,     a negative result within the first hours     of the onset of symptoms does not rule out     myocardial infarction with certainty.     If myocardial infarction is still suspected,     repeat the test at appropriate intervals.  URINALYSIS, ROUTINE W REFLEX MICROSCOPIC     Status: Abnormal   Collection Time    04/11/13 12:08 AM      Result Value Range   Color, Urine BROWN (*) YELLOW   Comment: BIOCHEMICALS MAY BE AFFECTED BY COLOR   APPearance CLOUDY (*) CLEAR   Specific Gravity, Urine 1.030  1.005 - 1.030   pH 5.0  5.0 - 8.0   Glucose, UA >1000 (*) NEGATIVE mg/dL   Hgb urine dipstick LARGE (*) NEGATIVE   Bilirubin Urine NEGATIVE  NEGATIVE   Ketones, ur >80 (*) NEGATIVE mg/dL   Protein, ur >213 (*) NEGATIVE mg/dL   Urobilinogen, UA 0.2  0.0 - 1.0 mg/dL   Nitrite NEGATIVE  NEGATIVE   Leukocytes, UA SMALL (*) NEGATIVE  URINE MICROSCOPIC-ADD ON     Status: None   Collection Time    04/11/13 12:08 AM      Result Value Range   Squamous Epithelial / LPF RARE  RARE   WBC, UA 3-6  <3 WBC/hpf   RBC / HPF TOO NUMEROUS TO COUNT  <3 RBC/hpf   Urine-Other URINALYSIS PERFORMED ON SUPERNATANT  Comment: MICROSCOPIC EXAM PERFORMED ON UNCONCENTRATED URINE   CG4 I-STAT (LACTIC ACID)     Status: Abnormal   Collection Time    04/11/13 12:48 AM      Result Value Range   Lactic Acid, Venous 2.85 (*) 0.5 - 2.2 mmol/L  GLUCOSE, CAPILLARY     Status: Abnormal   Collection Time    04/11/13  1:34 AM      Result Value Range   Glucose-Capillary 340 (*) 70 - 99 mg/dL   Comment 1 Notify RN     Comment 2 Documented in Chart    GLUCOSE, CAPILLARY     Status: Abnormal   Collection Time    04/11/13  3:02 AM      Result Value Range   Glucose-Capillary 297 (*) 70 - 99 mg/dL   Comment 1 Notify RN     Comment 2 Documented in Chart    GLUCOSE, CAPILLARY     Status: Abnormal   Collection Time    04/11/13  5:14 AM      Result Value Range   Glucose-Capillary 260 (*) 70 - 99 mg/dL  GLUCOSE, CAPILLARY     Status: Abnormal   Collection Time    04/11/13  6:22 AM      Result Value Range   Glucose-Capillary 225 (*) 70 - 99 mg/dL   Comment 1 Notify RN     Comment 2 Documented in Chart    GLUCOSE, CAPILLARY     Status: Abnormal   Collection Time    04/11/13  7:32 AM      Result Value Range   Glucose-Capillary 174 (*) 70 - 99 mg/dL  BASIC METABOLIC PANEL     Status: Abnormal   Collection Time    04/11/13  7:40 AM      Result Value Range   Sodium 128 (*) 135 - 145 mEq/L   Potassium 4.0  3.5 - 5.1 mEq/L   Comment: DELTA CHECK NOTED     REPEATED TO VERIFY   Chloride 95 (*) 96 - 112 mEq/L   Comment: DELTA CHECK NOTED     REPEATED TO VERIFY   CO2 22  19 - 32 mEq/L   Glucose, Bld 201 (*) 70 - 99 mg/dL   BUN 24 (*) 6 - 23 mg/dL   Creatinine, Ser 4.78  0.50 - 1.35 mg/dL   Calcium 8.5  8.4 - 29.5 mg/dL   GFR calc non Af Amer 74 (*) >90 mL/min   GFR calc Af Amer 86 (*) >90 mL/min   Comment: (NOTE)     The eGFR has been calculated using the CKD EPI equation.     This calculation has not been validated in all clinical situations.     eGFR's persistently <90 mL/min signify possible Chronic Kidney     Disease.  BLOOD GAS, VENOUS     Status: Abnormal    Collection Time    04/11/13  7:49 AM      Result Value Range   pH, Ven 7.341 (*) 7.250 - 7.300   pCO2, Ven 41.8 (*) 45.0 - 50.0 mmHg   pO2, Ven 00.0 (*) 30.0 - 45.0 mmHg   Comment: CRITICAL RESULT CALLED TO, READ BACK BY AND VERIFIED WITH:      DR.HOLWERDA,MD AT 6213 BY LISA CRADDOCK,RRT,RCP ON 04/11/13   Bicarbonate 22.0  20.0 - 24.0 mEq/L   TCO2 20.0  0 - 100 mmol/L   Acid-base deficit 3.1 (*) 0.0 - 2.0 mmol/L   O2 Saturation  51.4     Patient temperature 37.0     Collection site VENOUS     Drawn by COLLECTED BY LABORATORY     Sample type VENOUS    GLUCOSE, CAPILLARY     Status: Abnormal   Collection Time    04/11/13  8:39 AM      Result Value Range   Glucose-Capillary 144 (*) 70 - 99 mg/dL  GLUCOSE, CAPILLARY     Status: Abnormal   Collection Time    04/11/13  9:41 AM      Result Value Range   Glucose-Capillary 116 (*) 70 - 99 mg/dL  GLUCOSE, CAPILLARY     Status: Abnormal   Collection Time    04/11/13 10:47 AM      Result Value Range   Glucose-Capillary 104 (*) 70 - 99 mg/dL  GLUCOSE, CAPILLARY     Status: Abnormal   Collection Time    04/11/13 11:44 AM      Result Value Range   Glucose-Capillary 134 (*) 70 - 99 mg/dL  BASIC METABOLIC PANEL     Status: Abnormal   Collection Time    04/11/13 12:30 PM      Result Value Range   Sodium 134 (*) 135 - 145 mEq/L   Potassium 4.1  3.5 - 5.1 mEq/L   Chloride 100  96 - 112 mEq/L   CO2 21  19 - 32 mEq/L   Glucose, Bld 135 (*) 70 - 99 mg/dL   BUN 19  6 - 23 mg/dL   Creatinine, Ser 1.61  0.50 - 1.35 mg/dL   Calcium 8.3 (*) 8.4 - 10.5 mg/dL   GFR calc non Af Amer >90  >90 mL/min   GFR calc Af Amer >90  >90 mL/min   Comment: (NOTE)     The eGFR has been calculated using the CKD EPI equation.     This calculation has not been validated in all clinical situations.     eGFR's persistently <90 mL/min signify possible Chronic Kidney     Disease.  GLUCOSE, CAPILLARY     Status: Abnormal   Collection Time    04/11/13 12:48 PM       Result Value Range   Glucose-Capillary 137 (*) 70 - 99 mg/dL  GLUCOSE, CAPILLARY     Status: Abnormal   Collection Time    04/11/13  1:39 PM      Result Value Range   Glucose-Capillary 135 (*) 70 - 99 mg/dL  GLUCOSE, CAPILLARY     Status: Abnormal   Collection Time    04/11/13  2:35 PM      Result Value Range   Glucose-Capillary 163 (*) 70 - 99 mg/dL  GLUCOSE, CAPILLARY     Status: Abnormal   Collection Time    04/11/13  4:39 PM      Result Value Range   Glucose-Capillary 239 (*) 70 - 99 mg/dL    US Renal  0/96/0454   CLINICAL DATA:  Acute renal insufficiency. Recent bilateral ureteral stent placement. Urolithiasis.  EXAM: RENAL/URINARY TRACT ULTRASOUND COMPLETE  COMPARISON:  CT on 12/14/2012  FINDINGS: Right Kidney  Length: 12.5 cm. Echogenicity within normal limits. No mass or hydronephrosis visualized. 1.3 cm simple cyst seen in midpole.  Left Kidney  Length: 12.0 cm. Echogenicity within normal limits. No mass or hydronephrosis visualized.  Bladder: Appears normal for degree of bladder distention. Bilateral ureteral stents seen within urinary bladder.  IMPRESSION: No evidence of hydronephrosis. Bilateral ureteral stents seen within the  urinary bladder.   Electronically Signed   By: Myles Rosenthal   On: 04/11/2013 07:25    Review of Systems  Constitutional: Negative.  Negative for fever and chills.  HENT: Negative.   Eyes: Negative.   Respiratory: Negative.   Cardiovascular: Negative.   Gastrointestinal: Negative.  Negative for nausea and vomiting.  Genitourinary: Positive for urgency and frequency.       Bladder spasms   Musculoskeletal: Negative.   Skin: Negative.   Neurological: Negative.   Endo/Heme/Allergies: Negative.   Psychiatric/Behavioral: Negative.    Blood pressure 121/58, pulse 78, temperature 97.4 F (36.3 C), temperature source Oral, resp. rate 24, height 5\' 9"  (1.753 m), weight 75.297 kg (166 lb), SpO2 99.00%. Physical Exam  Constitutional: He is oriented to  person, place, and time. He appears well-developed and well-nourished.  Wife at bedside  HENT:  Head: Normocephalic and atraumatic.  Eyes: EOM are normal. Pupils are equal, round, and reactive to light.  Neck: Normal range of motion. Neck supple.  Cardiovascular: Normal rate.   Respiratory: Effort normal and breath sounds normal.  GI: Soft. Bowel sounds are normal.  Genitourinary: Penis normal.  Mild SP TTP. No CVAT  Musculoskeletal: Normal range of motion.  Neurological: He is alert and oriented to person, place, and time.  Skin: Skin is warm and dry.  Psychiatric: He has a normal mood and affect. His behavior is normal. Judgment and thought content normal.    Assessment/Plan:  1 - Nephrolithiasis - I have discussed with primary team and agree that proceeding with surgery tomorrow is reasonable as long as AM BMP favorable. I strongly feel that delaying treatment would prolong his current symptomatology and pose more risk of continued metabolic derangement versus proceeding to get him stone and stent free as quickly as safely possible. ER urine w/o infectious parameters.   I will evaluate the patient again in the AM and will elect to proceed as planned if continuing to progress, though we are certainly happy to reschedule if necessary for his safety.  We rediscussed ureteroscopic stone manipulation with basketing and laser-lithotripsy in detail. We rediscussed risks including bleeding, infection, damage to kidney / ureter bladder, rarely loss of kidney. We rediscussed anesthetic risks and rare but serious surgical complications including DVT, PE, MI, and mortality. We specifically readdressed that in 5-10% of cases a staged approach is required with stenting followed by re-attempt ureteroscopy if anatomy unfavorable. The patient voiced understanding and wises to proceed tomorrow as planned if doing well from metabolic standpoint.     Tykeshia Tourangeau 04/11/2013, 6:43 PM

## 2013-04-11 NOTE — Progress Notes (Signed)
,  Inpatient Diabetes Program Recommendations  AACE/ADA: New Consensus Statement on Inpatient Glycemic Control (2013)  Target Ranges:  Prepandial:   less than 140 mg/dL      Peak postprandial:   less than 180 mg/dL (1-2 hours)      Critically ill patients:  140 - 180 mg/dL   Potential need for other half of home basal lantus: Left message for Dr Evlyn Kanner regarding need for 20 units lantus. Pt also requesting an HS snack which i have ordered on carb modified diet.  Inpatient Diabetes Program Recommendations Insulin - Basal: Pt has received half his basal lantus this am. Will need other 20 units this evening (unless he is started on IV insulin dirp tonight before surgery in am?)  Thank you, Lenor Coffin, RN, CNS, Diabetes Coordinator [phone])

## 2013-04-11 NOTE — Progress Notes (Signed)
Dr. Evlyn Kanner via phone given update regarding Pt's blood sugars and new orders given.

## 2013-04-11 NOTE — Progress Notes (Signed)
   CARE MANAGEMENT ED NOTE 04/11/2013  Patient:  James [name], James [name]   Account Number:  [id number]  Date Initiated:  04/11/2013  Documentation initiated by:  Edd Arbour  Subjective/Objective Assessment:     Subjective/Objective Assessment Detail:   Type 1 DM of Dr Lyndle Herrlich who has been having decreased control of his DM since placement of B/L uretal stents and L lithotripsy on Friday. He presents to the ED in DKA w/ dehydration w/ gluc 449, AG 28, HCO3 14, ketonuria, BUN 32, Cr 1.42, GFR 53, lactate 2.85    NS at 125cc/hr + 20 KCl. Insulin ggt. FSBS qhour w/ BMP q6hrs. Will continue insulin ggt until gap closes. Admit to SDU     Action/Plan:   UR completed   Action/Plan Detail:   Anticipated DC Date:  04/14/2013     Status Recommendation to Physician:   Result of Recommendation:    Other ED Services  Consult Working Plan    DC Planning Services  Other    Choice offered to / List presented to:            Status of service:  Completed, signed off  ED Comments:   ED Comments Detail:

## 2013-04-11 NOTE — Progress Notes (Signed)
Subjective: Feeling better. No nausea. No abd pain or back pain   Objective: Vital signs in last 24 hours: Temp:  [98.2 F (36.8 C)-98.5 F (36.9 C)] 98.5 F (36.9 C) (09/24 0010) Pulse Rate:  [83-104] 83 (09/24 0840) Resp:  [18-24] 18 (09/24 0840) BP: (114-132)/(58-64) 114/62 mmHg (09/24 0840) SpO2:  [94 %-98 %] 97 % (09/24 0840)  Intake/Output from previous [place]: 09/23 0701 - 09/24 0700 In: 480 [P.O.:480] Out: 1075 [Urine:1075] Intake/Output this shift: Total I/O In: -  Out: 150 [Urine:150]  Lying on right side, no distress. Oral membranes moist. No Kussmaul resp. Lungs clear ht regular abd protuberant with good BS's. Awake but a bit sleepy. Clear speech, calm mentating well  Lab Results   Recent Labs  04/10/13 2320  WBC 9.4  RBC 4.85  HGB 15.9  HCT 43.3  MCV 89.3  MCH 32.8  RDW 11.8  PLT 283    Recent Labs  04/10/13 2320 04/11/13 0740  NA 127* 128*  K 4.9 4.0  CL 85* 95*  CO2 14* 22  GLUCOSE 449* 201*  BUN 32* 24*  CREATININE 1.42* 1.08  CALCIUM 9.3 8.5    Studies/Results: US Renal  04/11/2013   CLINICAL DATA:  Acute renal insufficiency. Recent bilateral ureteral stent placement. Urolithiasis.  EXAM: RENAL/URINARY TRACT ULTRASOUND COMPLETE  COMPARISON:  CT on 12/14/2012  FINDINGS: Right Kidney  Length: 12.5 cm. Echogenicity within normal limits. No mass or hydronephrosis visualized. 1.3 cm simple cyst seen in midpole.  Left Kidney  Length: 12.0 cm. Echogenicity within normal limits. No mass or hydronephrosis visualized.  Bladder: Appears normal for degree of bladder distention. Bilateral ureteral stents seen within urinary bladder.  IMPRESSION: No evidence of hydronephrosis. Bilateral ureteral stents seen within the urinary bladder.   Electronically Signed   By: Myles Rosenthal   On: 04/11/2013 07:25    Scheduled Meds: . pantoprazole  40 mg Oral Q0600  . simvastatin  40 mg Oral QPM  . sodium chloride  3 mL Intravenous Q12H  . tamsulosin  0.4 mg Oral QPC  supper  . traZODone  150 mg Oral QHS   Continuous Infusions: . 0.9 % NaCl with KCl 20 mEq / L 150 mL/hr at 04/11/13 0951  . insulin (NOVOLIN-R) infusion 2.5 Units/hr (04/11/13 0843)   PRN Meds:albuterol, meclizine, ondansetron (ZOFRAN) IV, ondansetron, oxybutynin, oxyCODONE-acetaminophen, polyethylene glycol  Assessment/Plan: DKA: Acidosis has cleared. BS down. Can advance diet. Will give a bit of Lantus and SS Check serum ketones. Renal Stones: US shows no obstruction   LOS: 1 [place]   Jaice Digioia ALAN 04/11/2013, 10:36 AM

## 2013-04-11 NOTE — ED Provider Notes (Signed)
Medical screening examination/treatment/procedure(s) were performed by non-physician practitioner and as supervising physician I was immediately available for consultation/collaboration.  Dali Kraner, MD 04/11/13 0505 

## 2013-04-12 ENCOUNTER — Inpatient Hospital Stay (HOSPITAL_COMMUNITY): Payer: 59 | Admitting: Anesthesiology

## 2013-04-12 ENCOUNTER — Ambulatory Visit (HOSPITAL_COMMUNITY): Admission: RE | Admit: 2013-04-12 | Payer: 59 | Source: Ambulatory Visit | Admitting: Urology

## 2013-04-12 ENCOUNTER — Encounter (HOSPITAL_COMMUNITY): Payer: Self-pay | Admitting: Anesthesiology

## 2013-04-12 ENCOUNTER — Encounter (HOSPITAL_COMMUNITY)
Admission: EM | Disposition: A | Discharge status: Home / Self Care | Payer: 59 | Source: Home / Self Care | Attending: Endocrinology

## 2013-04-12 HISTORY — PX: CYSTOSCOPY WITH STENT PLACEMENT: SHX5790

## 2013-04-12 HISTORY — PX: CYSTOSCOPY/RETROGRADE/URETEROSCOPY: SHX5316

## 2013-04-12 LAB — COMPREHENSIVE METABOLIC PANEL
ALT: 10 U/L (ref 0–53)
AST: 12 U/L (ref 0–37)
Albumin: 2.6 g/dL — ABNORMAL LOW (ref 3.5–5.2)
Alkaline Phosphatase: 80 U/L (ref 39–117)
Calcium: 7.8 mg/dL — ABNORMAL LOW (ref 8.4–10.5)
Chloride: 101 mEq/L (ref 96–112)
Glucose, Bld: 256 mg/dL — ABNORMAL HIGH (ref 70–99)
Potassium: 3.8 mEq/L (ref 3.5–5.1)
Sodium: 133 mEq/L — ABNORMAL LOW (ref 135–145)
Total Bilirubin: 0.2 mg/dL — ABNORMAL LOW (ref 0.3–1.2)
Total Protein: 4.9 g/dL — ABNORMAL LOW (ref 6.0–8.3)

## 2013-04-12 LAB — GLUCOSE, CAPILLARY
Glucose-Capillary: 246 mg/dL — ABNORMAL HIGH (ref 70–99)
Glucose-Capillary: 134 mg/dL — ABNORMAL HIGH (ref 70–99)
Glucose-Capillary: 118 mg/dL — ABNORMAL HIGH (ref 70–99)
Glucose-Capillary: 109 mg/dL — ABNORMAL HIGH (ref 70–99)

## 2013-04-12 LAB — CBC
HCT: 31.4 % — ABNORMAL LOW (ref 39.0–52.0)
Hemoglobin: 11.5 g/dL — ABNORMAL LOW (ref 13.0–17.0)
MCHC: 36.6 g/dL — ABNORMAL HIGH (ref 30.0–36.0)
Platelets: 231 10*3/uL (ref 150–400)
RDW: 11.9 % (ref 11.5–15.5)
WBC: 4.6 10*3/uL (ref 4.0–10.5)

## 2013-04-12 LAB — KETONES, QUALITATIVE

## 2013-04-12 LAB — SURGICAL PCR SCREEN: MRSA, PCR: INVALID — AB

## 2013-04-12 SURGERY — CYSTOSCOPY/RETROGRADE/URETEROSCOPY
Anesthesia: General | Laterality: Right | Wound class: Clean Contaminated

## 2013-04-12 MED ORDER — SODIUM CHLORIDE 0.9 % IV SOLN
INTRAVENOUS | Status: DC
  Administered 2013-04-12 (×2): via INTRAVENOUS

## 2013-04-12 MED ORDER — BELLADONNA ALKALOIDS-OPIUM 16.2-60 MG RE SUPP
RECTAL | Status: DC | PRN
  Administered 2013-04-12: 1 via RECTAL

## 2013-04-12 MED ORDER — GLUCERNA SHAKE PO LIQD
237.0000 mL | Freq: Every day | ORAL | Status: DC | PRN
  Filled 2013-04-12: qty 237

## 2013-04-12 MED ORDER — INSULIN GLARGINE 100 UNIT/ML ~~LOC~~ SOLN
25.0000 [IU] | Freq: Two times a day (BID) | SUBCUTANEOUS | Status: DC
  Administered 2013-04-12 – 2013-04-13 (×3): 25 [IU] via SUBCUTANEOUS
  Filled 2013-04-12 (×4): qty 0.25

## 2013-04-12 MED ORDER — HYDROMORPHONE HCL PF 1 MG/ML IJ SOLN
INTRAMUSCULAR | Status: DC | PRN
  Administered 2013-04-12: .6 mg via INTRAVENOUS
  Administered 2013-04-12: .2 mg via INTRAVENOUS
  Administered 2013-04-12: .4 mg via INTRAVENOUS
  Administered 2013-04-12 (×2): .2 mg via INTRAVENOUS
  Administered 2013-04-12: .4 mg via INTRAVENOUS

## 2013-04-12 MED ORDER — LACTATED RINGERS IV SOLN
INTRAVENOUS | Status: DC
  Administered 2013-04-12: 1000 mL via INTRAVENOUS

## 2013-04-12 MED ORDER — LIDOCAINE HCL (CARDIAC) 20 MG/ML IV SOLN
INTRAVENOUS | Status: DC | PRN
  Administered 2013-04-12: 100 mg via INTRAVENOUS

## 2013-04-12 MED ORDER — ADULT MULTIVITAMIN W/MINERALS CH
1.0000 | ORAL_TABLET | Freq: Every day | ORAL | Status: DC
  Administered 2013-04-12 – 2013-04-13 (×2): 1 via ORAL
  Filled 2013-04-12 (×2): qty 1

## 2013-04-12 MED ORDER — HYDROMORPHONE HCL PF 1 MG/ML IJ SOLN
0.2500 mg | INTRAMUSCULAR | Status: DC | PRN
  Administered 2013-04-12: 0.5 mg via INTRAVENOUS

## 2013-04-12 MED ORDER — FENTANYL CITRATE 0.05 MG/ML IJ SOLN
INTRAMUSCULAR | Status: DC | PRN
  Administered 2013-04-12 (×2): 50 ug via INTRAVENOUS

## 2013-04-12 MED ORDER — TRAMADOL HCL 50 MG PO TABS
50.0000 mg | ORAL_TABLET | Freq: Four times a day (QID) | ORAL | Status: DC
  Administered 2013-04-12 – 2013-04-13 (×4): 50 mg via ORAL
  Filled 2013-04-12 (×4): qty 1

## 2013-04-12 MED ORDER — DEXAMETHASONE SODIUM PHOSPHATE 10 MG/ML IJ SOLN
INTRAMUSCULAR | Status: DC | PRN
  Administered 2013-04-12: 10 mg via INTRAVENOUS

## 2013-04-12 MED ORDER — KETOROLAC TROMETHAMINE 30 MG/ML IJ SOLN
30.0000 mg | Freq: Three times a day (TID) | INTRAMUSCULAR | Status: DC
  Administered 2013-04-12 – 2013-04-13 (×3): 30 mg via INTRAVENOUS
  Filled 2013-04-12 (×6): qty 1

## 2013-04-12 MED ORDER — SODIUM CHLORIDE 0.9 % IR SOLN
Status: DC | PRN
  Administered 2013-04-12: 3000 mL via INTRAVESICAL

## 2013-04-12 MED ORDER — PROPOFOL 10 MG/ML IV BOLUS
INTRAVENOUS | Status: DC | PRN
  Administered 2013-04-12: 200 mg via INTRAVENOUS

## 2013-04-12 MED ORDER — MIDAZOLAM HCL 5 MG/5ML IJ SOLN
INTRAMUSCULAR | Status: DC | PRN
  Administered 2013-04-12: 1 mg via INTRAVENOUS

## 2013-04-12 MED ORDER — IOHEXOL 300 MG/ML  SOLN
INTRAMUSCULAR | Status: DC | PRN
  Administered 2013-04-12: 25 mL

## 2013-04-12 MED ORDER — PROMETHAZINE HCL 25 MG/ML IJ SOLN: 6.2500 mg | INTRAMUSCULAR | Status: DC | PRN

## 2013-04-12 MED ORDER — KETOROLAC TROMETHAMINE 30 MG/ML IJ SOLN
INTRAMUSCULAR | Status: DC | PRN
  Administered 2013-04-12: 30 mg via INTRAVENOUS

## 2013-04-12 MED ORDER — CIPROFLOXACIN IN D5W 400 MG/200ML IV SOLN
400.0000 mg | INTRAVENOUS | Status: AC
  Administered 2013-04-12: 400 mg via INTRAVENOUS

## 2013-04-12 SURGICAL SUPPLY — 19 items
BAG URO CATCHER STRL LF (DRAPE) ×3 IMPLANT
BASKET LASER NITINOL 1.9FR (BASKET) ×1 IMPLANT
BSKT STON RTRVL 120 1.9FR (BASKET) ×2
CATH INTERMIT  6FR 70CM (CATHETERS) ×1 IMPLANT
CATH URET 5FR 28IN OPEN ENDED (CATHETERS) IMPLANT
CLOTH BEACON ORANGE TIMEOUT ST (SAFETY) ×3 IMPLANT
DRAPE CAMERA CLOSED 9X96 (DRAPES) ×3 IMPLANT
GLOVE BIOGEL M STRL SZ7.5 (GLOVE) ×3 IMPLANT
GOWN PREVENTION PLUS LG XLONG (DISPOSABLE) ×3 IMPLANT
GOWN PREVENTION PLUS XLARGE (GOWN DISPOSABLE) ×3 IMPLANT
GUIDEWIRE ANG ZIPWIRE 038X150 (WIRE) ×4 IMPLANT
GUIDEWIRE STR DUAL SENSOR (WIRE) ×3 IMPLANT
MANIFOLD NEPTUNE II (INSTRUMENTS) ×3 IMPLANT
PACK CYSTO (CUSTOM PROCEDURE TRAY) ×3 IMPLANT
SHEATH ACCESS URETERAL 38CM (SHEATH) ×1 IMPLANT
STENT CONTOUR 6FRX24X.038 (STENTS) ×1 IMPLANT
STENT CONTOUR 6FRX26X.038 (STENTS) ×1 IMPLANT
TUBE FEEDING 8FR 16IN STR KANG (MISCELLANEOUS) ×3 IMPLANT
TUBING CONNECTING 10 (TUBING) ×3 IMPLANT

## 2013-04-12 NOTE — Progress Notes (Signed)
Subjective:  1 - Nephrolithiasis - Pt s/p left ureteroscopic stone manipulation of upper and lower pole moeity ureters 04/06/13 and bilateral stenting (4 stents toal) for eft 4mm UVJ stone + small scattered bilateral intrarenal stones (all <72mm) found on CT in ER on w/u flank pain. Two prior epsidoes 1984 and 1985 passed spontaneously.   PT tentatively scheduled for right sided ureteroscopic stone manipulation and left sided stent removal todayand now admitted for mild DKA and possible near over-dose from narcotic and benzodiazapine medications prescribed to help with his significant pain from having the 4 stents in situ.   Today Ildefonso is seen doing well and back to baseline per family. Pain controlled. Acidosis remains resolved.   Objective: Vital signs in last 24 hours: Temp:  [97.4 F (36.3 C)-98.4 F (36.9 C)] 97.8 F (36.6 C) (09/25 0545) Pulse Rate:  [71-83] 71 (09/25 0545) Resp:  [16-24] 16 (09/25 0545) BP: (109-121)/(57-62) 109/59 mmHg (09/25 0545) SpO2:  [96 %-99 %] 96 % (09/25 0545) Weight:  [75.297 kg (166 lb)-75.524 kg (166 lb 8 oz)] 75.297 kg (166 lb) (09/24 1203) Last BM Date: 04/06/13 (per pt, hasn't had BM in 5 days)  Intake/Output from previous [place]: 09/24 0701 - 09/25 0700 In: 1630 [P.O.:460; I.V.:1170] Out: 650 [Urine:650] Intake/Output this shift:    General appearance: alert, cooperative, appears stated age and wife at bedside Head: Normocephalic, without obvious abnormality, atraumatic Eyes: conjunctivae/corneas clear. PERRL, EOM's intact. Fundi benign. Ears: normal TM's and external ear canals both ears Nose: Nares normal. Septum midline. Mucosa normal. No drainage or sinus tenderness. Throat: lips, mucosa, and tongue normal; teeth and gums normal Neck: no adenopathy, no carotid bruit, no JVD, supple, symmetrical, trachea midline and thyroid not enlarged, symmetric, no tenderness/mass/nodules Back: symmetric, no curvature. ROM normal. No CVA  tenderness. Resp: clear to auscultation bilaterally Chest wall: no tenderness Cardio: regular rate and rhythm, S1, S2 normal, no murmur, click, rub or gallop GI: soft, non-tender; bowel sounds normal; no masses,  no organomegaly Male genitalia: normal Extremities: extremities normal, atraumatic, no cyanosis or edema Pulses: 2+ and symmetric Skin: Skin color, texture, turgor normal. No rashes or lesions Lymph nodes: Cervical, supraclavicular, and axillary nodes normal. Neurologic: Grossly normal  Lab Results:   Recent Labs  04/10/13 2320 04/12/13 0423  WBC 9.4 4.6  HGB 15.9 11.5*  HCT 43.3 31.4*  PLT 283 231   BMET  Recent Labs  04/11/13 1753 04/12/13 0423  NA 132* 133*  K 3.8 3.8  CL 99 101  CO2 18* 23  GLUCOSE 239* 256*  BUN 15 11  CREATININE 0.75 0.69  CALCIUM 8.1* 7.8*   PT/INR No results found for this basename: LABPROT, INR,  in the last 72 hours ABG  Recent Labs  04/11/13 0749  HCO3 22.0    Studies/Results: US Renal  04/11/2013   CLINICAL DATA:  Acute renal insufficiency. Recent bilateral ureteral stent placement. Urolithiasis.  EXAM: RENAL/URINARY TRACT ULTRASOUND COMPLETE  COMPARISON:  CT on 12/14/2012  FINDINGS: Right Kidney  Length: 12.5 cm. Echogenicity within normal limits. No mass or hydronephrosis visualized. 1.3 cm simple cyst seen in midpole.  Left Kidney  Length: 12.0 cm. Echogenicity within normal limits. No mass or hydronephrosis visualized.  Bladder: Appears normal for degree of bladder distention. Bilateral ureteral stents seen within urinary bladder.  IMPRESSION: No evidence of hydronephrosis. Bilateral ureteral stents seen within the urinary bladder.   Electronically Signed   By: Myles Rosenthal   On: 04/11/2013 07:25    Anti-infectives:  Anti-infectives   None      Assessment/Plan:  1 - Nephrolithiasis - Proceed today as planned pending anesthesia eval. Agree with keeping in house overnight tonight and DC tomorrow without narcotic or  benzos.  [facility], Vyctoria Dickman 04/12/2013
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
## 2013-04-12 NOTE — Preoperative (Addendum)
Beta Blockers   Reason not to administer Beta Blockers:Not Applicable 

## 2013-04-12 NOTE — Brief Op Note (Signed)
04/10/2013 - 04/12/2013  1:33 PM  PATIENT:  Barbara Cower R Windle  57 y.o. male  PRE-OPERATIVE DIAGNOSIS:  Right Residual Renal Stone  POST-OPERATIVE DIAGNOSIS:  Right Residual Renal Stone  PROCEDURE:  Procedure(s) with comments: CYSTOSCOPY/RETROGRADE/URETEROSCOPY (Bilateral) - RIGHT RETROGRADE RIGHT URETEROSCOPY STONE EXTRACTION WITH BASKET LEFT STENTS REMOVED RIGHT STENTS REMOVED AND REPLACED    CYSTOSCOPY WITH STENT PLACEMENT (Right)  SURGEON:  Surgeon(s) and Role:    * Sebastian Ache, MD - Primary  PHYSICIAN ASSISTANT:   ASSISTANTS: none   ANESTHESIA:   general  EBL:  Total I/O In: -  Out: 800 [Urine:800]  BLOOD ADMINISTERED:none  DRAINS: none   LOCAL MEDICATIONS USED:  NONE  SPECIMEN:  Source of Specimen:  Rt Upper and Lower Pole Moeity Renal Stones  DISPOSITION OF SPECIMEN:  [facility] for compositional analysis  COUNTS:  YES  TOURNIQUET:  * No tourniquets in log *  DICTATION: .Other Dictation: Dictation Number [id number]  PLAN OF CARE: Admit to inpatient   PATIENT DISPOSITION:  PACU - hemodynamically stable.   Delay start of Pharmacological VTE agent (>24hrs) due to surgical blood loss or risk of bleeding: not applicable

## 2013-04-12 NOTE — Anesthesia Preprocedure Evaluation (Signed)
Anesthesia Evaluation  Patient identified by MRN, date of birth, ID band Patient awake    Reviewed: Allergy & Precautions, H&P , NPO status , Patient's Chart, lab work & pertinent test results  Airway Mallampati: II TM Distance: <3 FB Neck ROM: Full    Dental  (+) Poor Dentition and Loose   Pulmonary COPDCurrent Smoker,  breath sounds clear to auscultation  Pulmonary exam normal       Cardiovascular + CAD Rhythm:Regular Rate:Normal     Neuro/Psych Anxiety negative neurological ROS     GI/Hepatic negative GI ROS, Neg liver ROS,   Endo/Other  negative endocrine ROSdiabetes, Insulin Dependent  Renal/GU negative Renal ROS  negative genitourinary   Musculoskeletal negative musculoskeletal ROS (+)   Abdominal   Peds negative pediatric ROS (+)  Hematology negative hematology ROS (+)   Anesthesia Other Findings   Reproductive/Obstetrics negative OB ROS                           Anesthesia Physical Anesthesia Plan  ASA: III  Anesthesia Plan: General   Post-op Pain Management:    Induction: Intravenous  Airway Management Planned: LMA  Additional Equipment:   Intra-op Plan:   Post-operative Plan:   Informed Consent: I have reviewed the patients History and Physical, chart, labs and discussed the procedure including the risks, benefits and alternatives for the proposed anesthesia with the patient or authorized representative who has indicated his/her understanding and acceptance.   Dental advisory given  Plan Discussed with: CRNA and Surgeon  Anesthesia Plan Comments:         Anesthesia Quick Evaluation

## 2013-04-12 NOTE — Anesthesia Postprocedure Evaluation (Signed)
  Anesthesia Post-op Note  Patient: James [name] R Current  Procedure(s) Performed: Procedure(s) (LRB): CYSTOSCOPY/RETROGRADE/URETEROSCOPY (Bilateral) CYSTOSCOPY WITH STENT PLACEMENT (Right)  Patient Location: PACU  Anesthesia Type: General  Level of Consciousness: awake and alert   Airway and Oxygen Therapy: Patient Spontanous Breathing  Post-op Pain: mild  Post-op Assessment: Post-op Vital signs reviewed, Patient's Cardiovascular Status Stable, Respiratory Function Stable, Patent Airway and No signs of Nausea or vomiting  Last Vitals:  Filed Vitals:   04/12/13 1400  BP:   Pulse:   Temp:   Resp: 12    Post-op Vital Signs: stable   Complications: No apparent anesthesia complications

## 2013-04-12 NOTE — Progress Notes (Signed)
Came to visit patient at bedside. However, he was off the unit for procedure. 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 Will try to come back to visit at later time. Nonetheless, his Link to [facility] Coordinator will follow up with post hospital discharge. Left contact information in patient's room. Raiford Noble, MSN- Ed, RN,BSN- [facility] [phone]

## 2013-04-12 NOTE — Progress Notes (Signed)
INITIAL NUTRITION ASSESSMENT  DOCUMENTATION CODES Per approved criteria  -Not Applicable   INTERVENTION: Add Glucerna Shake po daily prn, each supplement provides 220 kcal and 10 grams of protein. Add MVI. RD to continue to follow nutrition care plan.  NUTRITION DIAGNOSIS: Inadequate oral intake related to poor appetite as evidenced by pt report.   Goal: Intake to meet >90% of estimated nutrition needs.  Monitor:  weight trends, lab trends, I/O's, PO intake, supplement tolerance  Reason for Assessment: Malnutrition Screening Tool  57 y.o. male  Admitting Dx: DKA  ASSESSMENT: PMHx significant for DM1. Recent bilateral uretal stents and L lithotripsy 9/19. Admitted with DKA. Work-up reveals AKI.  Underwent nephrolithiasis today. Pt is unable to discuss nutrition history at this time 2/2 lethargy from surgery. Per chart review, he told the diabetes coordinator that he hasn't eaten in 5 days.  Currently on a Carbohydrate Modified Medium diet. Family at bedside confirm poor oral intake.  Height: Ht Readings from Last 1 Encounters:  04/11/13 5\' 9"  (1.753 m)    Weight: Wt Readings from Last 1 Encounters:  04/11/13 166 lb (75.297 kg)    Ideal Body Weight: 160 lb  % Ideal Body Weight: 104%  Wt Readings from Last 10 Encounters:  04/11/13 166 lb (75.297 kg)  04/11/13 166 lb (75.297 kg)  04/05/13 170 lb 6.3 oz (77.29 kg)  04/05/13 170 lb 6.3 oz (77.29 kg)  03/30/13 170 lb 6.4 oz (77.293 kg)  12/14/12 170 lb (77.111 kg)  05/10/11 182 lb (82.555 kg)  04/15/11 174 lb 6.4 oz (79.107 kg)  03/31/11 167 lb (75.751 kg)  03/30/11 178 lb 6.4 oz (80.922 kg)    Usual Body Weight: 170 lb  % Usual Body Weight: 98%  BMI:  Body mass index is 24.5 kg/(m^2). WNL  Estimated Nutritional Needs: Kcal: 1600 - 1800 Protein: 75 - 90 g  Fluid: 1.6 - 1.8 liters  Skin: intact  Diet Order: Carb Control Medium  EDUCATION NEEDS: -No education needs identified at this  time   Intake/Output Summary (Last 24 hours) at 04/12/13 1621 Last data filed at 04/12/13 1500  Gross per 24 hour  Intake 2666.67 ml  Output   1402 ml  Net 1264.67 ml    Last BM: 9/21  Labs:   Recent Labs Lab 04/11/13 1230 04/11/13 1753 04/12/13 0423  NA 134* 132* 133*  K 4.1 3.8 3.8  CL 100 99 101  CO2 21 18* 23  BUN 19 15 11   CREATININE 0.91 0.75 0.69  CALCIUM 8.3* 8.1* 7.8*  GLUCOSE 135* 239* 256*    CBG (last 3)   Recent Labs  04/12/13 1057 04/12/13 1207 04/12/13 1346  GLUCAP 134* 118* 133*    Scheduled Meds: . insulin aspart  0-15 Units Subcutaneous TID WC  . insulin aspart  0-5 Units Subcutaneous QHS  . insulin aspart  3 Units Subcutaneous TID WC  . insulin glargine  25 Units Subcutaneous BID  . ketorolac  30 mg Intravenous Q8H  . pantoprazole  40 mg Oral Q0600  . simvastatin  40 mg Oral QPM  . sodium chloride  3 mL Intravenous Q12H  . tamsulosin  0.4 mg Oral QPC supper  . traMADol  50 mg Oral Q6H  . traZODone  150 mg Oral QHS    Continuous Infusions: . sodium chloride 100 mL/hr at 04/12/13 1436    Past Medical History  Diagnosis Date  . DIABETES MELLITUS, TYPE I 04/03/2007  . DIABETIC RETINOPATHY, PROLIFERATIVE 04/03/2007  . DM  W/EYE MANIFESTATIONS, TYPE I, UNCONTROLLED 04/04/2007  . HYPERLIPIDEMIA 04/04/2007  . ANXIETY 04/03/2007  . DEPRESSION 04/03/2007  . ASTHMATIC BRONCHITIS, ACUTE 10/25/2008  . ASTHMA 09/06/2008  . ED (erectile dysfunction)   . Cervical disc disease   . Bladder neck obstruction   . COPD (chronic obstructive pulmonary disease)   . Spinal stenosis   . Depression   . Anxiety   . CORONARY ARTERY DISEASE 04/03/2007  . 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 RENAL INSUFFICIENCY 07/31/2007  . Renal insufficiency   . CARPAL TUNNEL SYNDROME, BILATERAL 07/31/2007    issues resolved, no surgery    Past Surgical History  Procedure Laterality Date  . Ptca    . Spine surgery      s/p C-spine disc surgery-?fusion   . Appendectomy    . Stress cardiolite  09/06/2002  . Tonsillectomy    . Lymph node removed from groin  age 52  . Cystoscopy with retrograde pyelogram, ureteroscopy and stent placement Bilateral 04/06/2013    Procedure: BILATERAL CYSTOSCOPY WITH RETROGRADE PYELOGRAMS, STENT PLACEMENTS AND LEFT URETEROSCOPY AND STONE REMOVAL;  Surgeon: Sebastian Ache, MD;  Location: [facility];  Service: Urology;  Laterality: Bilateral;  . Holmium laser application Left 04/06/2013    Procedure: HOLMIUM LASER APPLICATION;  Surgeon: Sebastian Ache, MD;  Location: [facility];  Service: Urology;  Laterality: Left;    Jarold Motto MS, RD, LDN Pager: [phone] After-hours pager: [phone]

## 2013-04-12 NOTE — Transfer of Care (Signed)
Immediate Anesthesia Transfer of Care Note  Patient: James [name]  Procedure(s) Performed: Procedure(s) with comments: CYSTOSCOPY/RETROGRADE/URETEROSCOPY (Bilateral) - RIGHT RETROGRADE RIGHT URETEROSCOPY STONE EXTRACTION WITH BASKET LEFT STENTS REMOVED RIGHT STENTS REMOVED AND REPLACED    CYSTOSCOPY WITH STENT PLACEMENT (Right)  Patient Location: PACU  Anesthesia Type:General  Level of Consciousness: awake and sedated  Airway & Oxygen Therapy: Patient Spontanous Breathing and Patient connected to face mask oxygen  Post-op Assessment: Report given to PACU RN and Post -op Vital signs reviewed and stable  Post vital signs: Reviewed and stable  Complications: No apparent anesthesia complications

## 2013-04-12 NOTE — Progress Notes (Signed)
Subjective: Doing better overall. Some dry mouth. Some back pain and dysuria. No N/V. Some vague abd pain. no SOB   Objective: Vital signs in last 24 hours: Temp:  [97.4 F (36.3 C)-98.4 F (36.9 C)] 97.8 F (36.6 C) (09/25 0545) Pulse Rate:  [71-83] 71 (09/25 0545) Resp:  [16-24] 16 (09/25 0545) BP: (109-121)/(57-62) 109/59 mmHg (09/25 0545) SpO2:  [96 %-99 %] 96 % (09/25 0545) Weight:  [75.297 kg (166 lb)-75.524 kg (166 lb 8 oz)] 75.297 kg (166 lb) (09/24 1203)  Intake/Output from previous [place]: 09/24 0701 - 09/25 0700 In: 1630 [P.O.:460; I.V.:1170] Out: 650 [Urine:650] Intake/Output this shift:    Nontoxic, sl dry oral membranes. Lungs clear no wheeze or rales. Ht regular abd min distention, hypoactive BS's awake, mentating OK  Lab Results   Recent Labs  04/10/13 2320 04/12/13 0423  WBC 9.4 4.6  RBC 4.85 3.60*  HGB 15.9 11.5*  HCT 43.3 31.4*  MCV 89.3 87.2  MCH 32.8 31.9  RDW 11.8 11.9  PLT 283 231    Recent Labs  04/11/13 1753 04/12/13 0423  NA 132* 133*  K 3.8 3.8  CL 99 101  CO2 18* 23  GLUCOSE 239* 256*  BUN 15 11  CREATININE 0.75 0.69  CALCIUM 8.1* 7.8*    Studies/Results: US Renal  04/11/2013   CLINICAL DATA:  Acute renal insufficiency. Recent bilateral ureteral stent placement. Urolithiasis.  EXAM: RENAL/URINARY TRACT ULTRASOUND COMPLETE  COMPARISON:  CT on 12/14/2012  FINDINGS: Right Kidney  Length: 12.5 cm. Echogenicity within normal limits. No mass or hydronephrosis visualized. 1.3 cm simple cyst seen in midpole.  Left Kidney  Length: 12.0 cm. Echogenicity within normal limits. No mass or hydronephrosis visualized.  Bladder: Appears normal for degree of bladder distention. Bilateral ureteral stents seen within urinary bladder.  IMPRESSION: No evidence of hydronephrosis. Bilateral ureteral stents seen within the urinary bladder.   Electronically Signed   By: Myles Rosenthal   On: 04/11/2013 07:25    Scheduled Meds: . insulin aspart  0-15 Units  Subcutaneous TID WC  . insulin aspart  0-5 Units Subcutaneous QHS  . insulin aspart  3 Units Subcutaneous TID WC  . insulin glargine  25 Units Subcutaneous BID  . pantoprazole  40 mg Oral Q0600  . simvastatin  40 mg Oral QPM  . sodium chloride  3 mL Intravenous Q12H  . tamsulosin  0.4 mg Oral QPC supper  . traMADol  50 mg Oral Q6H  . traZODone  150 mg Oral QHS   Continuous Infusions: . sodium chloride     PRN Meds:albuterol, meclizine, ondansetron (ZOFRAN) IV, ondansetron, oxybutynin, polyethylene glycol  Assessment/Plan: DKA: Acidosis cleared. Electrolytes OK. Small serum ketones. NPO for procedure. Will continue split lantus.  RENAL STONES: to have stents changed out. This should reduce pain and reduce risk of recurrent issues. Will proceed since metabolic situation corrected quickly MEDICATION OVERUSE: family very concerned about excess use of narcs and BZD: will minimize use and watch for withdrawal \ RENAL INSUFFIC: sl better  LOS: 2 days   James [name] 04/12/2013, 8:25 AM

## 2013-04-13 ENCOUNTER — Encounter (HOSPITAL_COMMUNITY): Payer: Self-pay | Admitting: Urology

## 2013-04-13 LAB — COMPREHENSIVE METABOLIC PANEL
Albumin: 2.8 g/dL — ABNORMAL LOW (ref 3.5–5.2)
BUN: 16 mg/dL (ref 6–23)
CO2: 22 mEq/L (ref 19–32)
Chloride: 101 mEq/L (ref 96–112)
Creatinine, Ser: 1.04 mg/dL (ref 0.50–1.35)
Total Bilirubin: 0.2 mg/dL — ABNORMAL LOW (ref 0.3–1.2)
Total Protein: 5.1 g/dL — ABNORMAL LOW (ref 6.0–8.3)

## 2013-04-13 LAB — CBC
HCT: 31.8 % — ABNORMAL LOW (ref 39.0–52.0)
Hemoglobin: 11.8 g/dL — ABNORMAL LOW (ref 13.0–17.0)
MCHC: 37.1 g/dL — ABNORMAL HIGH (ref 30.0–36.0)
MCV: 87.4 fL (ref 78.0–100.0)
RBC: 3.64 MIL/uL — ABNORMAL LOW (ref 4.22–5.81)
RDW: 11.6 % (ref 11.5–15.5)

## 2013-04-13 LAB — GLUCOSE, CAPILLARY
Glucose-Capillary: 220 mg/dL — ABNORMAL HIGH (ref 70–99)
Glucose-Capillary: 341 mg/dL — ABNORMAL HIGH (ref 70–99)

## 2013-04-13 LAB — KETONES, QUALITATIVE

## 2013-04-13 MED ORDER — TRAMADOL HCL 50 MG PO TABS: 50.0000 mg | ORAL_TABLET | Freq: Four times a day (QID) | ORAL | Status: DC

## 2013-04-13 NOTE — Op Note (Signed)
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  PROCEDURE: 1. Cystoscopy with left ureteral stent pole upper and lower. 2. Right ureteral stent exchange upper and lower, upper 6 x 26 with     tether, lower 6 x 24 with tether. 3. Right ureteroscopy with basketing of stone in upper and lower pole     moieties.  ESTIMATED BLOOD LOSS:  Nil.  COMPLICATIONS:  None.  SPECIMEN:  Right upper and lower pole moiety renal stone for compositional analysis.  FINDINGS: 1. Unremarkable urinary bladder. 2. Unremarkable right retrograde pyelogram to the upper and lower pole     moieties. 3. Multifocal right upper and lower pole moiety renal stone without     ureteral stone.  INDICATION:  Mr. Stave is a 57 year old with history of recurrent nephrolithiasis, as well as bilateral complete renal duplication.  He was found on workup of acute flank pain to have a left ureteral stone and underwent left-sided procedures last week addressing as such, the upper and lower pole moiety stone as well as his ureteral stone.  He now presents for second-stage procedure to address his right-sided renal stone and removal of his left ureteral stents.  Informed consent was obtained and placed in medical record.  PROCEDURE IN DETAIL:  The patient being Jeter Hizer verified.  Procedure being right ureteroscopic stone manipulation was confirmed.  Procedure was carried out.  Time-out was performed.  Intravenous antibiotics were administered.  General LMA anesthesia was introduced.  The patient was placed into a low lithotomy position.  Sterile field was created  by prepping and draping the patient's penis, perineum, and proximal thighs using iodine x3.  Next, cystourethroscopy was performed using a 22- French rigid cystoscope with 12-degree offset lens.  Inspection of anterior and posterior urethra unremarkable.  Inspection of bladder revealed bilateral double stents in situ distally.  The left upper pole moiety stent was grasped and brought out in its entirety and the left lower pole stent was separately grasped brought out in its entirety.  On the right side, the lower pole moiety stent was grasped brought to the level of urethral meatus through which a 0.038 Glidewire was advanced and the stent was exchanged for a 6-French end-hole catheter.  Right retrograde pyelogram was seen.  Right retrograde pyelogram of the lower pole moiety demonstrated no filling defects or narrowing.  An 0.038 wire was once again advanced and set aside as a safety wire.  Next, semi-rigid ureteroscopy was performed of the distal two-thirds of the right lower pole moiety ureter.  No mucosal abnormalities or calcifications were found.  This was performed alongside a separate Sensor working wire and the semi-rigid ureteroscope was exchanged for the 12/14 38-cm ureteral access sheath over the sensor working wire through the level of proximal ureter.  Next,  flexible digital ureteroscopy was performed using 8-French digital ureteroscope, and this revealed multifocal papillary tip calcifications in the lower pole moiety.  These were separately grasped with an escape basket and brought out, set aside for compositional analysis.  Repeat inspection of this area revealed complete resolution of all stones.  No evidence of perforation.  The sheath was removed under continuous endoscopic vision and no additional stones or mucosal abnormalities were found.  A new 6 x 24 stent was placed using cystoscopic and fluoroscopic guidance into the lower pole moiety with the string left in  place exiting the penis.  Using cystoscope, the upper pole moiety stent was grasped brought to the level of urethral meatus through which a 0.038 Glidewire was advanced. The stent was exchanged for a 6-French end-hole catheter and right upper pole moiety.  Retrograde pyelogram was obtained.  Right upper moiety retrograde pyelogram demonstrated no filling defects or narrowing of the ureter or kidney.  Glidewire was once again advanced to the safety wire.  Semi-rigid ureteroscopy was performed of the distal two-thirds of the right upper pole moiety ureter which revealed no mucosal abnormalities or calcifications.  This performed alongside a separate Sensor working wire.  The semi-rigid ureteroscope was exchanged for the 12/14 38-cm ureteral access sheath under fluoroscopic guidance. Next, flexible digital ureteroscopy performed of the proximal right upper pole moiety ureter and systematic inspection of the right upper pole moiety.  This revealed a single papillary tip calcifications which was grasped with escape basket and brought out in its entirety.  Repeat inspection revealed complete resolution of all stones and dissection of the kidney.  The sheath was removed and no mucosal abnormalities were found.  Finally, a new 6 x 26 stent was placed in the upper pole moiety on the right side using cystoscopic and fluoroscopic guidance.  Good proximal and distal curl were noted.  Tether was also left in situ exiting the penis.  Bladder was emptied per cystoscope.  A B and O suppository was placed per rectum and the distal ends of the tether was fashioned in the dorsum of the penis.  Procedure was terminated.  The patient tolerated the procedure well.  There were no immediate periprocedural complications.  The patient was taken to the postanesthesia care unit in stable condition.          ______________________________ Sebastian Ache, MD     TM/MEDQ  D:  04/12/2013  T:  04/13/2013  Job:   [id number]

## 2013-04-13 NOTE — Discharge Summary (Signed)
DISCHARGE SUMMARY  James [name]  MR#: [id number]  DOB:04/09/1956  Date of Admission: 04/10/2013 Date of Discharge: 04/13/2013  Attending Physician:Precilla Purnell ALAN  Patient's HYQ:MVHQI,ONGEXBM James Diener, MD  Consults:Treatment Team:  Sebastian Ache, MD   Discharge Diagnoses: Active Problems:  Diabetic ketoacidosis, moderate, resolved Known type 1 diabetes with retinopathy Bilateral renal stones, improved after procedure Bladder outlet obstruction, on medications Coronary artery disease, stable Depression, fair Diabetic nephropathy with acute worsening, improved Hypertension, stable Hyperlipidemia, on treatment Gastroparesis, stable Asthma, stable Mild hyponatremia Protein calorie malnutrition Mild anemia  Procedures: IV insulin drip, telemetry, manipulation of ureteral stents  Discharge Medications:   Medication List    STOP taking these medications       oxyCODONE-acetaminophen 5-325 MG per tablet  Commonly known as:  ROXICET      TAKE these medications       acetaminophen 500 MG tablet  Commonly known as:  TYLENOL  Take 1,000 mg by mouth every 6 (six) hours as needed for pain.     albuterol 108 (90 BASE) MCG/ACT inhaler  Commonly known as:  PROVENTIL HFA;VENTOLIN HFA  Inhale 2 puffs into the lungs every 6 (six) hours as needed for wheezing or shortness of breath.     HUMALOG KWIKPEN 100 UNIT/ML Sopn  Generic drug:  insulin lispro  Inject 1-25 Units into the skin as directed. Pt uses per sliding scale and checks blood sugar 4-5 times daily BS 120 every 20 above 120 give 1 unit.     ibuprofen 200 MG tablet  Commonly known as:  ADVIL,MOTRIN  Take 200 mg by mouth every 6 (six) hours as needed for pain.     insulin glargine 100 UNIT/ML injection  Commonly known as:  LANTUS  Inject 40 Units into the skin at bedtime.     ketorolac 10 MG tablet  Commonly known as:  TORADOL  Take 10 mg by mouth every 6 (six) hours as needed for pain.     meclizine 25 MG  tablet  Commonly known as:  ANTIVERT  Take 25 mg by mouth 3 (three) times daily as needed for nausea.     oxybutynin 5 MG tablet  Commonly known as:  DITROPAN  Take 1 tablet (5 mg total) by mouth every 8 (eight) hours as needed. For bladder spasms / stent discomfort.     simvastatin 40 MG tablet  Commonly known as:  ZOCOR  Take 40 mg by mouth every evening.     tamsulosin 0.4 MG Caps capsule  Commonly known as:  FLOMAX  Take 0.4 mg by mouth daily after supper.     traMADol 50 MG tablet  Commonly known as:  ULTRAM  Take 1 tablet (50 mg total) by mouth every 6 (six) hours.     traZODone 100 MG tablet  Commonly known as:  DESYREL  Take 150 mg by mouth at bedtime.     URIBEL 118 MG Caps  Take 1 capsule by mouth daily.     varenicline 1 MG tablet  Commonly known as:  CHANTIX  Take 1 mg by mouth 2 (two) times daily.        Hospital Procedures: US Renal  04/11/2013   CLINICAL DATA:  Acute renal insufficiency. Recent bilateral ureteral stent placement. Urolithiasis.  EXAM: RENAL/URINARY TRACT ULTRASOUND COMPLETE  COMPARISON:  CT on 12/14/2012  FINDINGS: Right Kidney  Length: 12.5 cm. Echogenicity within normal limits. No mass or hydronephrosis visualized. 1.3 cm simple cyst seen in midpole.  Left Kidney  Length: 12.0  cm. Echogenicity within normal limits. No mass or hydronephrosis visualized.  Bladder: Appears normal for degree of bladder distention. Bilateral ureteral stents seen within urinary bladder.  IMPRESSION: No evidence of hydronephrosis. Bilateral ureteral stents seen within the urinary bladder.   Electronically Signed   By: Myles Rosenthal   On: 04/11/2013 07:25    History of Present Illness: High sugars  Hospital Course: This is a 57 year old white male with long-standing type 1 diabetes with multiple complications who presented with diabetic ketoacidosis. He been struggling with the pain of renal stones and had not been eating or drinking well for for 5 days. He been  overtaking Valium and  Percocet and apparently missed some insulin. He is moderately ill at presentation with acidosis and moderately severe hyper glycemia. Fortunately with hydration and IV insulin drip his acidosis resolved fairly quickly. His volume deficits were repleted. His potassium stayed normal. He has some transient hyponatremia that has resolved. He is long had sub optimal diabetes control and actually missed a visit with me last week that was to address this. Now his blood sugars are in the low 200s without hypoglycemia. I believe we can manage this adequately as an outpatient. Recent problems with his renal stones as worsened the situation. Fortunately the procedure as improved this issue. He is to keep the stents until Monday and they'll be removed. Incidentally, he has 4 ureters rather than 2 in all 4 were affected by stone disease. At the moment he is tolerating his diet well. He's had a bowel movement. He has ambulated somewhat. He has no nausea or abdominal pain. He is breathing comfortably without chest pain. No other new problems have arisen. Multiple family members have addressed concerns over overuse of medications. Tramadol will be our choice for severe pain with Toradol for minor to moderate pain. He showed no signs of withdrawal while here.  [place] of Discharge Exam BP 125/65  Pulse 81  Temp(Src) 98.3 F (36.8 C) (Oral)  Resp 18  Ht 5\' 9"  (1.753 m)  Wt 75.297 kg (166 lb)  BMI 24.5 kg/m2  SpO2 98%  Physical Exam: General appearance: Nontoxic white male sitting up in no distress face is symmetric. Oral mucous members are moist. Pharynx is clear.   Resp: Clear without wheezes rales or rhonchi. No excess her muscles are in use. No Kussmaul pattern is present. Cardio: Regular and slightly fast with systolic murmur GI: Slightly distended soft, non-tender; bowel sounds normal; no masses,  no organomegaly Extremities: Pulses are fair, no edema is present Neuro: He is awake alert  and mentating well. He is calm without evidence of hallucinations or delusions. No tremor is present speech is clear and fluent.  Discharge Labs:  Recent Labs  04/12/13 0423 04/13/13 0435  NA 133* 132*  K 3.8 4.5  CL 101 101  CO2 23 22  GLUCOSE 256* 244*  BUN 11 16  CREATININE 0.69 1.04  CALCIUM 7.8* 8.3*    Recent Labs  04/12/13 0423 04/13/13 0435  AST 12 13  ALT 10 11  ALKPHOS 80 84  BILITOT 0.2* 0.2*  PROT 4.9* 5.1*  ALBUMIN 2.6* 2.8*    Recent Labs  04/12/13 0423 04/13/13 0435  WBC 4.6 7.0  HGB 11.5* 11.8*  HCT 31.4* 31.8*  MCV 87.2 87.4  PLT 231 221     Recent Labs  04/10/13 2320  TROPONINI <0.30      Discharge instructions:      Future Appointments Provider Department Dept Phone  09/19/2013 8:00 AM Sherrie George, MD [facility] [phone]      Disposition: To home  Follow-up Appts: Follow-up with Dr. Evlyn Kanner at [facility] in 1-2 weeks.  Call for appointment.  Condition on Discharge: Improved  Tests Needing Follow-up: None  Signed: Jadore Veals ALAN 04/13/2013, 9:16 AM

## 2013-04-13 NOTE — Progress Notes (Signed)
[facility]  Subjective:  1 - Nephrolithiasis - Pt s/p left ureteroscopic stone manipulation of upper and lower pole moeity ureters 04/06/13 and bilateral stenting (4 stents toal) for left 4mm UVJ stone + small scattered bilateral intrarenal stones (all <45mm) found on CT in ER on w/u flank pain. Now s/p second stage procedure addressing rt sided stones 9/25. Two prior epsidoes 1984 and 1985 passed spontaneously.   Today James [name] is seen post-op. He has some mild SP tenderness as expected form stents that is manageable.   Objective: Vital signs in last 24 hours: Temp:  [97.6 F (36.4 C)-98.3 F (36.8 C)] 98.3 F (36.8 C) (09/26 0552) Pulse Rate:  [64-82] 81 (09/26 0552) Resp:  [11-18] 18 (09/26 0552) BP: (121-142)/(51-75) 125/65 mmHg (09/26 0552) SpO2:  [94 %-100 %] 98 % (09/26 0552) Last BM Date: 04/08/13  Intake/Output from previous [place]: 09/25 0701 - 09/26 0700 In: 2336.7 [I.V.:2336.7] Out: 1002 [Urine:1000; Blood:2] Intake/Output this shift: Total I/O In: 921.7 [I.V.:921.7] Out: 100 [Urine:100]  General appearance: alert, cooperative and appears stated age Head: Normocephalic, without obvious abnormality, atraumatic Eyes: conjunctivae/corneas clear. PERRL, EOM's intact. Fundi benign. Ears: normal TM's and external ear canals both ears Nose: Nares normal. Septum midline. Mucosa normal. No drainage or sinus tenderness. Throat: lips, mucosa, and tongue normal; teeth and gums normal Neck: no adenopathy, no carotid bruit, no JVD, supple, symmetrical, trachea midline and thyroid not enlarged, symmetric, no tenderness/mass/nodules Back: symmetric, no curvature. ROM normal. No CVA tenderness. Resp: clear to auscultation bilaterally Chest wall: no tenderness Cardio: regular rate and rhythm, S1, S2 normal, no murmur, click, rub or gallop GI: soft, non-tender; bowel sounds normal; no masses,  no organomegaly Male genitalia: normal, distal tetheres of stents in situ and taped to dorsum of  penis Extremities: extremities normal, atraumatic, no cyanosis or edema Pulses: 2+ and symmetric Skin: Skin color, texture, turgor normal. No rashes or lesions Lymph nodes: Cervical, supraclavicular, and axillary nodes normal. Neurologic: Grossly normal  Lab Results:   Recent Labs  04/12/13 0423 04/13/13 0435  WBC 4.6 7.0  HGB 11.5* 11.8*  HCT 31.4* 31.8*  PLT 231 221   BMET  Recent Labs  04/12/13 0423 04/13/13 0435  NA 133* 132*  K 3.8 4.5  CL 101 101  CO2 23 22  GLUCOSE 256* 244*  BUN 11 16  CREATININE 0.69 1.04  CALCIUM 7.8* 8.3*   PT/INR No results found for this basename: LABPROT, INR,  in the last 72 hours ABG  Recent Labs  04/11/13 0749  HCO3 22.0    Studies/Results: US Renal  04/11/2013   CLINICAL DATA:  Acute renal insufficiency. Recent bilateral ureteral stent placement. Urolithiasis.  EXAM: RENAL/URINARY TRACT ULTRASOUND COMPLETE  COMPARISON:  CT on 12/14/2012  FINDINGS: Right Kidney  Length: 12.5 cm. Echogenicity within normal limits. No mass or hydronephrosis visualized. 1.3 cm simple cyst seen in midpole.  Left Kidney  Length: 12.0 cm. Echogenicity within normal limits. No mass or hydronephrosis visualized.  Bladder: Appears normal for degree of bladder distention. Bilateral ureteral stents seen within urinary bladder.  IMPRESSION: No evidence of hydronephrosis. Bilateral ureteral stents seen within the urinary bladder.   Electronically Signed   By: Myles Rosenthal   On: 04/11/2013 07:25    Anti-infectives: Anti-infectives   Start     Dose/Rate Route Frequency Ordered Stop   04/12/13 1217  ciprofloxacin (CIPRO) IVPB 400 mg     400 mg 200 mL/hr over 60 Minutes Intravenous 30 min pre-op 04/12/13 1213  04/12/13 1225      Assessment/Plan:  1 - Nephrolithiasis - Now stone free. Keep f/u visit 10/9 as listed on DC instructions and pull stents Monday at home as instructed.   Acceptable for DC at any point from GU perspective. I feel pain control should  be adequate with toradol along for home or similar.  Greatly appreciate primary team's comanagment.     LOS: 3 days    [facility], [place] 04/13/2013

## 2013-04-14 LAB — MRSA CULTURE

## 2013-05-24 ENCOUNTER — Other Ambulatory Visit: Payer: Self-pay

## 2013-06-07 ENCOUNTER — Emergency Department (INDEPENDENT_AMBULATORY_CARE_PROVIDER_SITE_OTHER): Payer: 59

## 2013-06-07 ENCOUNTER — Encounter (HOSPITAL_COMMUNITY): Payer: Self-pay | Admitting: Emergency Medicine

## 2013-06-07 ENCOUNTER — Emergency Department (HOSPITAL_COMMUNITY)
Admission: EM | Admit: 2013-06-07 | Discharge: 2013-06-07 | Disposition: A | Discharge status: Home / Self Care | Payer: 59 | Source: Home / Self Care | Attending: Family Medicine | Admitting: Family Medicine

## 2013-06-07 DIAGNOSIS — J441 Chronic obstructive pulmonary disease with (acute) exacerbation: Secondary | ICD-10-CM

## 2013-06-07 DIAGNOSIS — R079 Chest pain, unspecified: Secondary | ICD-10-CM

## 2013-06-07 DIAGNOSIS — R0781 Pleurodynia: Secondary | ICD-10-CM

## 2013-06-07 MED ORDER — IPRATROPIUM BROMIDE 0.02 % IN SOLN
0.5000 mg | RESPIRATORY_TRACT | Status: DC
  Administered 2013-06-07: 0.5 mg via RESPIRATORY_TRACT

## 2013-06-07 MED ORDER — SODIUM CHLORIDE 0.9 % IN NEBU
INHALATION_SOLUTION | RESPIRATORY_TRACT | Status: AC
  Filled 2013-06-07: qty 6

## 2013-06-07 MED ORDER — ALBUTEROL SULFATE (5 MG/ML) 0.5% IN NEBU
INHALATION_SOLUTION | RESPIRATORY_TRACT | Status: AC
  Filled 2013-06-07: qty 1

## 2013-06-07 MED ORDER — HYDROCODONE-ACETAMINOPHEN 5-325 MG PO TABS: 1.0000 | ORAL_TABLET | Freq: Four times a day (QID) | ORAL | Status: DC | PRN

## 2013-06-07 MED ORDER — MOMETASONE FURO-FORMOTEROL FUM 200-5 MCG/ACT IN AERO: 2.0000 | INHALATION_SPRAY | Freq: Two times a day (BID) | RESPIRATORY_TRACT | Status: DC

## 2013-06-07 MED ORDER — IPRATROPIUM BROMIDE 0.02 % IN SOLN
RESPIRATORY_TRACT | Status: AC
  Filled 2013-06-07: qty 2.5

## 2013-06-07 MED ORDER — ALBUTEROL SULFATE (5 MG/ML) 0.5% IN NEBU
5.0000 mg | INHALATION_SOLUTION | RESPIRATORY_TRACT | Status: DC
  Administered 2013-06-07: 5 mg via RESPIRATORY_TRACT

## 2013-06-07 NOTE — ED Provider Notes (Signed)
CSN: [id number]     Arrival date & time 06/07/13  0808 History   None    Chief Complaint  Patient presents with  . Rib Injury   (Consider location/radiation/quality/duration/timing/severity/associated sxs/prior Treatment) HPI Comments: 57 year old male presents complaining of left rib and flank pain. Yesterday at 4 PM he fell onto a leaf blower on his left flank. Since then, he has pain in the left rib cage that is worse with inspiration or coughing. Additionally, he complains of having a cough for the past month. He has COPD and continues to smoke one half a pack of cigarettes daily. He had a cold about one month ago and the cough and shortness of breath that persisted.  He has not seen  anybody about this cough shortness of breath. He is currently only on albuterol for his COPD, he is prescribed Dulera twice daily but he does not use it. he is currently using his albuterol inhaler approximately 3-4 times daily. he denies fever, chills, NVD, abdominal pain, dizziness.    Past Medical History  Diagnosis Date  . DIABETES MELLITUS, TYPE I 04/03/2007  . DIABETIC RETINOPATHY, PROLIFERATIVE 04/03/2007  . DM W/EYE MANIFESTATIONS, TYPE I, UNCONTROLLED 04/04/2007  . HYPERLIPIDEMIA 04/04/2007  . ANXIETY 04/03/2007  . DEPRESSION 04/03/2007  . ASTHMATIC BRONCHITIS, ACUTE 10/25/2008  . ASTHMA 09/06/2008  . ED (erectile dysfunction)   . Cervical disc disease   . Bladder neck obstruction   . COPD (chronic obstructive pulmonary disease)   . Spinal stenosis   . Depression   . Anxiety   . CORONARY ARTERY DISEASE 04/03/2007  . 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 RENAL INSUFFICIENCY 07/31/2007  . Renal insufficiency   . CARPAL TUNNEL SYNDROME, BILATERAL 07/31/2007    issues resolved, no surgery   Past Surgical History  Procedure Laterality Date  . Ptca    . Spine surgery      s/p C-spine disc surgery-?fusion  . Appendectomy    . Stress cardiolite  09/06/2002  . Tonsillectomy    . Lymph  node removed from groin  age 89  . Cystoscopy with retrograde pyelogram, ureteroscopy and stent placement Bilateral 04/06/2013    Procedure: BILATERAL CYSTOSCOPY WITH RETROGRADE PYELOGRAMS, STENT PLACEMENTS AND LEFT URETEROSCOPY AND STONE REMOVAL;  Surgeon: Sebastian Ache, MD;  Location: [facility];  Service: Urology;  Laterality: Bilateral;  . Holmium laser application Left 04/06/2013    Procedure: HOLMIUM LASER APPLICATION;  Surgeon: Sebastian Ache, MD;  Location: [facility];  Service: Urology;  Laterality: Left;  . Cystoscopy/retrograde/ureteroscopy Bilateral 04/12/2013    Procedure: CYSTOSCOPY/RETROGRADE/URETEROSCOPY;  Surgeon: Sebastian Ache, MD;  Location: [facility];  Service: Urology;  Laterality: Bilateral;  RIGHT RETROGRADE RIGHT URETEROSCOPY STONE EXTRACTION WITH BASKET LEFT STENTS REMOVED RIGHT STENTS REMOVED AND REPLACED     . Cystoscopy with stent placement Right 04/12/2013    Procedure: CYSTOSCOPY WITH STENT PLACEMENT;  Surgeon: Sebastian Ache, MD;  Location: [facility];  Service: Urology;  Laterality: Right;   Family History  Problem Relation Age of Onset  . Stroke Father     strong FH cerebrovascular disease  . Diabetes Brother   . Heart disease Brother     CHF  . Colon cancer Neg Hx    History  Substance Use Topics  . Smoking status: Current Every M[place]ormac Smoker -- 1.00 packs/Springer for 38 years    Types: Cigarettes  . Smokeless tobacco: Never Used  . Alcohol Use: No    Review of Systems  Constitutional: Negative for fever,  chills and fatigue.  HENT: Negative for sore throat.   Eyes: Negative for visual disturbance.  Respiratory: Positive for cough and shortness of breath.   Cardiovascular: Negative for chest pain, palpitations and leg swelling.  Gastrointestinal: Negative for nausea, vomiting, abdominal pain, diarrhea and constipation.  Genitourinary: Negative for dysuria, urgency, frequency and hematuria.  Musculoskeletal: Negative for arthralgias, myalgias, neck pain and neck  stiffness.       Lower left ribcage pain  Skin: Negative for rash.  Neurological: Negative for dizziness, weakness and light-headedness.    Allergies  Iohexol and Lexapro  Home Medications   Current Outpatient Rx  Name  Route  Sig  Dispense  Refill  . insulin glargine (LANTUS) 100 UNIT/ML injection   Subcutaneous   Inject 40 Units into the skin at bedtime.          . insulin lispro (HUMALOG KWIKPEN) 100 unit/mL SOLN   Subcutaneous   Inject 1-25 Units into the skin as directed. Pt uses per sliding scale and checks blood sugar 4-5 times daily BS 120 every 20 above 120 give 1 unit.         . simvastatin (ZOCOR) 40 MG tablet   Oral   Take 40 mg by mouth every evening.         . traZODone (DESYREL) 100 MG tablet   Oral   Take 150 mg by mouth at bedtime.          [name] acetaminophen (TYLENOL) 500 MG tablet   Oral   Take 1,000 mg by mouth every 6 (six) hours as needed for pain.         [name] albuterol (PROVENTIL HFA;VENTOLIN HFA) 108 (90 BASE) MCG/ACT inhaler   Inhalation   Inhale 2 puffs into the lungs every 6 (six) hours as needed for wheezing or shortness of breath.         [name] HYDROcodone-acetaminophen (NORCO) 5-325 MG per tablet   Oral   Take 1 tablet by mouth every 6 (six) hours as needed for moderate pain (or for cough).   30 tablet   0   . ibuprofen (ADVIL,MOTRIN) 200 MG tablet   Oral   Take 200 mg by mouth every 6 (six) hours as needed for pain.          [name] ketorolac (TORADOL) 10 MG tablet   Oral   Take 10 mg by mouth every 6 (six) hours as needed for pain.         . meclizine (ANTIVERT) 25 MG tablet   Oral   Take 25 mg by mouth 3 (three) times daily as needed for nausea.         . Meth-Hyo-M Bl-Na Phos-Ph Sal (URIBEL) 118 MG CAPS   Oral   Take 1 capsule by mouth daily.          . mometasone-formoterol (DULERA) 200-5 MCG/ACT AERO   Inhalation   Inhale 2 puffs into the lungs 2 (two) times daily.   1 Inhaler   0   . oxybutynin (DITROPAN) 5 MG  tablet   Oral   Take 1 tablet (5 mg total) by mouth every 8 (eight) hours as needed. For bladder spasms / stent discomfort.   30 tablet   1   . tamsulosin (FLOMAX) 0.4 MG CAPS capsule   Oral   Take 0.4 mg by mouth daily after supper.         . traMADol (ULTRAM) 50 MG tablet   Oral   Take 1 tablet (  50 mg total) by mouth every 6 (six) hours.   30 tablet   0   . varenicline (CHANTIX) 1 MG tablet   Oral   Take 1 mg by mouth 2 (two) times daily.           BP 124/78  Pulse 92  Temp(Src) 98.6 F (37 C) (Oral)  Resp 16  SpO2 95% Physical Exam  Nursing note and vitals reviewed. Constitutional: He is oriented to person, place, and time. He appears well-developed and well-nourished. No distress.  HENT:  Head: Normocephalic.  Neck: No JVD present. No tracheal deviation present.  Cardiovascular: Normal rate, regular rhythm and normal heart sounds.  Exam reveals no gallop and no friction rub.   No murmur heard. Pulmonary/Chest: Effort normal. No respiratory distress. He has wheezes (diffuse). He has rhonchi (diffuse). He has rales (diffuse). He exhibits tenderness (left lower rib cage at anterior axillary line). He exhibits no mass, no crepitus and no retraction.  Abdominal: Soft. He exhibits no mass. There is no splenomegaly. There is no tenderness.  Neurological: He is alert and oriented to person, place, and time. Coordination normal.  Skin: Skin is warm and dry. No rash noted. He is not diaphoretic.  Psychiatric: He has a normal mood and affect. Judgment normal.    ED Course  Procedures (including critical care time) Labs Review Labs Reviewed - No data to display Imaging Review Dg Chest 2 View  06/07/2013   CLINICAL DATA:  Left rib cage pain.  EXAM: CHEST  2 VIEW  COMPARISON:  None.  FINDINGS: There is no focal parenchymal opacity, pleural effusion, or pneumothorax. The heart and mediastinal contours are unremarkable. There is an old healed right posterior rib fracture.   IMPRESSION: No active cardiopulmonary disease.   Electronically Signed   By: Elige Ko   On: 06/07/2013 09:07    Getting chest x-ray and giving nebulizer treatment  MDM   1. Rib pain on left side   2. COPD exacerbation    No fracture.  No PNA.  Instructed to restart his dulera.  Hydrocodone for pain and cough.  F/u with PCP in 1 week to re-check lungs    Meds ordered this encounter  Medications  . ipratropium (ATROVENT) nebulizer solution 0.5 mg    Sig:    And  . albuterol (PROVENTIL) (5 MG/ML) 0.5% nebulizer solution 5 mg    Sig:   . HYDROcodone-acetaminophen (NORCO) 5-325 MG per tablet    Sig: Take 1 tablet by mouth every 6 (six) hours as needed for moderate pain (or for cough).    Dispense:  30 tablet    Refill:  0    Order Specific Question:  Supervising Provider    Answer:  Clementeen Graham, S [id number]  . mometasone-formoterol (DULERA) 200-5 MCG/ACT AERO    Sig: Inhale 2 puffs into the lungs 2 (two) times daily.    Dispense:  1 Inhaler    Refill:  0    Order Specific Question:  Supervising Provider    Answer:  Clementeen Graham, Kathie Rhodes [[id number]]       Graylon Good, PA-C 06/07/13 [phone]

## 2013-06-07 NOTE — ED Notes (Signed)
Pt c/o left ribcage pain onset yest pm around 1600 States he tripped and fell onto a gas leaf blower and landed on left side rib Hurts to breath and to walk Also c/o persistent cough onset 4 weeks... Got over a cold but cough will not go away Hx of COPD... Smokes 0.5 ppd  Alert w/no signs of acute distress.

## 2013-06-08 ENCOUNTER — Emergency Department (HOSPITAL_COMMUNITY)
Admission: EM | Admit: 2013-06-08 | Discharge: 2013-06-08 | Disposition: A | Discharge status: Nursing Home (Includes ICF) | Payer: 59 | Source: Home / Self Care | Attending: Family Medicine | Admitting: Family Medicine

## 2013-06-08 ENCOUNTER — Encounter (HOSPITAL_COMMUNITY): Payer: Self-pay | Admitting: Emergency Medicine

## 2013-06-08 ENCOUNTER — Emergency Department (HOSPITAL_COMMUNITY)
Admission: EM | Admit: 2013-06-08 | Discharge: 2013-06-08 | Disposition: A | Discharge status: Home / Self Care | Payer: 59 | Attending: Emergency Medicine | Admitting: Emergency Medicine

## 2013-06-08 ENCOUNTER — Emergency Department (HOSPITAL_COMMUNITY): Payer: 59

## 2013-06-08 DIAGNOSIS — Z720 Tobacco use: Secondary | ICD-10-CM

## 2013-06-08 DIAGNOSIS — Y9301 Activity, walking, marching and hiking: Secondary | ICD-10-CM | POA: Insufficient documentation

## 2013-06-08 DIAGNOSIS — R079 Chest pain, unspecified: Secondary | ICD-10-CM

## 2013-06-08 DIAGNOSIS — R42 Dizziness and giddiness: Secondary | ICD-10-CM | POA: Insufficient documentation

## 2013-06-08 DIAGNOSIS — Z8739 Personal history of other diseases of the musculoskeletal system and connective tissue: Secondary | ICD-10-CM | POA: Insufficient documentation

## 2013-06-08 DIAGNOSIS — Z79899 Other long term (current) drug therapy: Secondary | ICD-10-CM | POA: Insufficient documentation

## 2013-06-08 DIAGNOSIS — I2510 Atherosclerotic heart disease of native coronary artery without angina pectoris: Secondary | ICD-10-CM

## 2013-06-08 DIAGNOSIS — S2249XA Multiple fractures of ribs, unspecified side, initial encounter for closed fracture: Secondary | ICD-10-CM | POA: Insufficient documentation

## 2013-06-08 DIAGNOSIS — R0602 Shortness of breath: Secondary | ICD-10-CM

## 2013-06-08 DIAGNOSIS — Z794 Long term (current) use of insulin: Secondary | ICD-10-CM | POA: Insufficient documentation

## 2013-06-08 DIAGNOSIS — J45901 Unspecified asthma with (acute) exacerbation: Secondary | ICD-10-CM | POA: Insufficient documentation

## 2013-06-08 DIAGNOSIS — E785 Hyperlipidemia, unspecified: Secondary | ICD-10-CM | POA: Insufficient documentation

## 2013-06-08 DIAGNOSIS — E11319 Type 2 diabetes mellitus with unspecified diabetic retinopathy without macular edema: Secondary | ICD-10-CM | POA: Insufficient documentation

## 2013-06-08 DIAGNOSIS — S2232XA Fracture of one rib, left side, initial encounter for closed fracture: Secondary | ICD-10-CM

## 2013-06-08 DIAGNOSIS — R071 Chest pain on breathing: Secondary | ICD-10-CM

## 2013-06-08 DIAGNOSIS — R002 Palpitations: Secondary | ICD-10-CM | POA: Insufficient documentation

## 2013-06-08 DIAGNOSIS — R296 Repeated falls: Secondary | ICD-10-CM | POA: Insufficient documentation

## 2013-06-08 DIAGNOSIS — Y92009 Unspecified place in unspecified non-institutional (private) residence as the place of occurrence of the external cause: Secondary | ICD-10-CM | POA: Insufficient documentation

## 2013-06-08 DIAGNOSIS — E1039 Type 1 diabetes mellitus with other diabetic ophthalmic complication: Secondary | ICD-10-CM | POA: Insufficient documentation

## 2013-06-08 DIAGNOSIS — Z8659 Personal history of other mental and behavioral disorders: Secondary | ICD-10-CM | POA: Insufficient documentation

## 2013-06-08 DIAGNOSIS — F17200 Nicotine dependence, unspecified, uncomplicated: Secondary | ICD-10-CM | POA: Insufficient documentation

## 2013-06-08 DIAGNOSIS — Z87448 Personal history of other diseases of urinary system: Secondary | ICD-10-CM | POA: Insufficient documentation

## 2013-06-08 DIAGNOSIS — IMO0002 Reserved for concepts with insufficient information to code with codable children: Secondary | ICD-10-CM | POA: Insufficient documentation

## 2013-06-08 DIAGNOSIS — E109 Type 1 diabetes mellitus without complications: Secondary | ICD-10-CM

## 2013-06-08 DIAGNOSIS — J441 Chronic obstructive pulmonary disease with (acute) exacerbation: Secondary | ICD-10-CM

## 2013-06-08 DIAGNOSIS — S298XXA Other specified injuries of thorax, initial encounter: Secondary | ICD-10-CM | POA: Insufficient documentation

## 2013-06-08 LAB — BASIC METABOLIC PANEL
BUN: 12 mg/dL (ref 6–23)
CO2: 26 mEq/L (ref 19–32)
Chloride: 101 mEq/L (ref 96–112)
Creatinine, Ser: 0.85 mg/dL (ref 0.50–1.35)
GFR calc Af Amer: >90 mL/min (ref >90)
GFR calc non Af Amer: >90 mL/min (ref >90)
Glucose, Bld: 69 mg/dL — ABNORMAL LOW (ref 70–99)
Potassium: 4.4 mEq/L (ref 3.5–5.1)

## 2013-06-08 LAB — CBC
HCT: 40.9 % (ref 39.0–52.0)
Hemoglobin: 15.2 g/dL (ref 13.0–17.0)
MCH: 33.4 pg (ref 26.0–34.0)
MCHC: 37.2 g/dL — ABNORMAL HIGH (ref 30.0–36.0)
MCV: 89.9 fL (ref 78.0–100.0)
RBC: 4.55 MIL/uL (ref 4.22–5.81)
RDW: 12.3 % (ref 11.5–15.5)

## 2013-06-08 LAB — D-DIMER, QUANTITATIVE: D-Dimer, Quant: 0.28 ug/mL-FEU (ref 0.00–0.48)

## 2013-06-08 MED ORDER — PREDNISONE 20 MG PO TABS: 40.0000 mg | ORAL_TABLET | Freq: Every day | ORAL | Status: DC

## 2013-06-08 MED ORDER — IPRATROPIUM BROMIDE 0.02 % IN SOLN
RESPIRATORY_TRACT | Status: AC
  Filled 2013-06-08: qty 2.5

## 2013-06-08 MED ORDER — KETOROLAC TROMETHAMINE 30 MG/ML IJ SOLN
30.0000 mg | Freq: Once | INTRAMUSCULAR | Status: AC
  Administered 2013-06-08: 30 mg via INTRAVENOUS
  Filled 2013-06-08: qty 1

## 2013-06-08 MED ORDER — ALBUTEROL SULFATE (5 MG/ML) 0.5% IN NEBU: 5.0000 mg | INHALATION_SOLUTION | RESPIRATORY_TRACT | Status: DC

## 2013-06-08 MED ORDER — IPRATROPIUM BROMIDE 0.02 % IN SOLN: 0.5000 mg | RESPIRATORY_TRACT | Status: DC

## 2013-06-08 MED ORDER — OXYCODONE-ACETAMINOPHEN 5-325 MG PO TABS: 1.0000 | ORAL_TABLET | ORAL | Status: DC | PRN

## 2013-06-08 MED ORDER — PREDNISONE 20 MG PO TABS
60.0000 mg | ORAL_TABLET | Freq: Once | ORAL | Status: AC
  Administered 2013-06-08: 60 mg via ORAL
  Filled 2013-06-08: qty 3

## 2013-06-08 MED ORDER — ALBUTEROL SULFATE (5 MG/ML) 0.5% IN NEBU
INHALATION_SOLUTION | RESPIRATORY_TRACT | Status: AC
  Filled 2013-06-08: qty 1

## 2013-06-08 MED ORDER — SODIUM CHLORIDE 0.9 % IV SOLN
Freq: Once | INTRAVENOUS | Status: AC
  Administered 2013-06-08: 15:00:00 via INTRAVENOUS

## 2013-06-08 MED ORDER — ALBUTEROL SULFATE (5 MG/ML) 0.5% IN NEBU
5.0000 mg | INHALATION_SOLUTION | Freq: Once | RESPIRATORY_TRACT | Status: AC
  Administered 2013-06-08: 5 mg via RESPIRATORY_TRACT
  Filled 2013-06-08: qty 1

## 2013-06-08 MED ORDER — IPRATROPIUM BROMIDE 0.02 % IN SOLN
0.5000 mg | Freq: Once | RESPIRATORY_TRACT | Status: AC
  Administered 2013-06-08: 0.5 mg via RESPIRATORY_TRACT
  Filled 2013-06-08: qty 2.5

## 2013-06-08 MED ORDER — SODIUM CHLORIDE 0.9 % IV BOLUS (SEPSIS)
1000.0000 mL | Freq: Once | INTRAVENOUS | Status: AC
  Administered 2013-06-08: 1000 mL via INTRAVENOUS

## 2013-06-08 MED ORDER — HYDROCODONE-HOMATROPINE 5-1.5 MG/5ML PO SYRP: 5.0000 mL | ORAL_SOLUTION | Freq: Four times a day (QID) | ORAL | Status: DC | PRN

## 2013-06-08 NOTE — ED Notes (Signed)
C/o sob. Hx of copd.  Pt was seen yesterday for a fall hitting left side.  Pt states that he went to work today and sob got worse and having wheezing.

## 2013-06-08 NOTE — ED Provider Notes (Signed)
Medical screening examination/treatment/procedure(s) were performed by resident physician or non-physician practitioner and as supervising physician I was immediately available for consultation/collaboration.   Shelbi Vaccaro DOUGLAS MD.   Zeth Buday D Destiney Sanabia, MD 06/08/13 1704 

## 2013-06-08 NOTE — ED Notes (Signed)
Pt provided [place] sandwich and coke. Pt eating.

## 2013-06-08 NOTE — ED Provider Notes (Signed)
CSN: [id number]     Arrival date & time 06/08/13  1429 History   None    Chief Complaint  Patient presents with  . Shortness of Breath   (Consider location/radiation/quality/duration/timing/severity/associated sxs/prior Treatment) HPI Comments: Patient with history of COPD presents complaining of chest pain radiating to his upper back between the shoulder blades, shortness of breath, wheezing, lightheadedness. She was seen here yesterday for rib pain after having fallen on a leaf blower the [place] before. At that time, he was found to be wheezing. Chest x-ray was negative, treatment for COPD exacerbation limited because he is uncontrolled diabetic but tried to treat with starting back his Dulera inhaler and albuterol. He is back today with worsening symptoms. He attempted to go to work this morning but he became too exhausted and short of breath so he had to quit and came here. His history includes diabetes, coronary artery disease, anxiety, depression, COPD (still smoker), renal insufficiency.  Patient is a 58 y.o. male presenting with shortness of breath.  Shortness of Breath Associated symptoms: chest pain and wheezing   Associated symptoms: no abdominal pain, no cough, no fever, no neck pain, no rash, no sore throat and no vomiting     Past Medical History  Diagnosis Date  . DIABETES MELLITUS, TYPE I 04/03/2007  . DIABETIC RETINOPATHY, PROLIFERATIVE 04/03/2007  . DM W/EYE MANIFESTATIONS, TYPE I, UNCONTROLLED 04/04/2007  . HYPERLIPIDEMIA 04/04/2007  . ANXIETY 04/03/2007  . DEPRESSION 04/03/2007  . ASTHMATIC BRONCHITIS, ACUTE 10/25/2008  . ASTHMA 09/06/2008  . ED (erectile dysfunction)   . Cervical disc disease   . Bladder neck obstruction   . COPD (chronic obstructive pulmonary disease)   . Spinal stenosis   . Depression   . Anxiety   . CORONARY ARTERY DISEASE 04/03/2007  . 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 RENAL INSUFFICIENCY 07/31/2007  . Renal insufficiency   . CARPAL  TUNNEL SYNDROME, BILATERAL 07/31/2007    issues resolved, no surgery   Past Surgical History  Procedure Laterality Date  . Ptca    . Spine surgery      s/p C-spine disc surgery-?fusion  . Appendectomy    . Stress cardiolite  09/06/2002  . Tonsillectomy    . Lymph node removed from groin  age 5  . Cystoscopy with retrograde pyelogram, ureteroscopy and stent placement Bilateral 04/06/2013    Procedure: BILATERAL CYSTOSCOPY WITH RETROGRADE PYELOGRAMS, STENT PLACEMENTS AND LEFT URETEROSCOPY AND STONE REMOVAL;  Surgeon: Sebastian Ache, MD;  Location: [facility];  Service: Urology;  Laterality: Bilateral;  . Holmium laser application Left 04/06/2013    Procedure: HOLMIUM LASER APPLICATION;  Surgeon: Sebastian Ache, MD;  Location: [facility];  Service: Urology;  Laterality: Left;  . Cystoscopy/retrograde/ureteroscopy Bilateral 04/12/2013    Procedure: CYSTOSCOPY/RETROGRADE/URETEROSCOPY;  Surgeon: Sebastian Ache, MD;  Location: [facility];  Service: Urology;  Laterality: Bilateral;  RIGHT RETROGRADE RIGHT URETEROSCOPY STONE EXTRACTION WITH BASKET LEFT STENTS REMOVED RIGHT STENTS REMOVED AND REPLACED     . Cystoscopy with stent placement Right 04/12/2013    Procedure: CYSTOSCOPY WITH STENT PLACEMENT;  Surgeon: Sebastian Ache, MD;  Location: [facility];  Service: Urology;  Laterality: Right;   Family History  Problem Relation Age of Onset  . Stroke Father     strong FH cerebrovascular disease  . Diabetes Brother   . Heart disease Brother     CHF  . Colon cancer Neg Hx    History  Substance Use Topics  . Smoking status: Current Every [place] Smoker --  1.00 packs/[place] for 38 years    Types: Cigarettes  . Smokeless tobacco: Never Used  . Alcohol Use: No    Review of Systems  Constitutional: Positive for fatigue. Negative for fever and chills.  HENT: Negative for sore throat.   Eyes: Negative for visual disturbance.  Respiratory: Positive for chest tightness, shortness of breath and wheezing. Negative for  cough.   Cardiovascular: Positive for chest pain. Negative for palpitations and leg swelling.  Gastrointestinal: Negative for nausea, vomiting, abdominal pain, diarrhea and constipation.  Genitourinary: Negative for dysuria, urgency, frequency and hematuria.  Musculoskeletal: Positive for back pain. Negative for arthralgias, myalgias, neck pain and neck stiffness.  Skin: Negative for rash.  Neurological: Positive for light-headedness. Negative for dizziness and weakness.    Allergies  Iohexol and Lexapro  Home Medications   Current Outpatient Rx  Name  Route  Sig  Dispense  Refill  . insulin glargine (LANTUS) 100 UNIT/ML injection   Subcutaneous   Inject 40 Units into the skin at bedtime.          . insulin lispro (HUMALOG KWIKPEN) 100 unit/mL SOLN   Subcutaneous   Inject 1-25 Units into the skin as directed. Pt uses per sliding scale and checks blood sugar 4-5 times daily BS 120 every 20 above 120 give 1 unit.         . traZODone (DESYREL) 100 MG tablet   Oral   Take 150 mg by mouth at bedtime.          [name] acetaminophen (TYLENOL) 500 MG tablet   Oral   Take 1,000 mg by mouth every 6 (six) hours as needed for pain.         [name] albuterol (PROVENTIL HFA;VENTOLIN HFA) 108 (90 BASE) MCG/ACT inhaler   Inhalation   Inhale 2 puffs into the lungs every 6 (six) hours as needed for wheezing or shortness of breath.         [name] HYDROcodone-acetaminophen (NORCO) 5-325 MG per tablet   Oral   Take 1 tablet by mouth every 6 (six) hours as needed for moderate pain (or for cough).   30 tablet   0   . ibuprofen (ADVIL,MOTRIN) 200 MG tablet   Oral   Take 200 mg by mouth every 6 (six) hours as needed for pain.          [name] ketorolac (TORADOL) 10 MG tablet   Oral   Take 10 mg by mouth every 6 (six) hours as needed for pain.         . meclizine (ANTIVERT) 25 MG tablet   Oral   Take 25 mg by mouth 3 (three) times daily as needed for nausea.         . Meth-Hyo-M Bl-Na Phos-Ph  Sal (URIBEL) 118 MG CAPS   Oral   Take 1 capsule by mouth daily.          . mometasone-formoterol (DULERA) 200-5 MCG/ACT AERO   Inhalation   Inhale 2 puffs into the lungs 2 (two) times daily.   1 Inhaler   0   . oxybutynin (DITROPAN) 5 MG tablet   Oral   Take 1 tablet (5 mg total) by mouth every 8 (eight) hours as needed. For bladder spasms / stent discomfort.   30 tablet   1   . simvastatin (ZOCOR) 40 MG tablet   Oral   Take 40 mg by mouth every evening.         . tamsulosin (FLOMAX) 0.4 MG  CAPS capsule   Oral   Take 0.4 mg by mouth daily after supper.         . traMADol (ULTRAM) 50 MG tablet   Oral   Take 1 tablet (50 mg total) by mouth every 6 (six) hours.   30 tablet   0   . varenicline (CHANTIX) 1 MG tablet   Oral   Take 1 mg by mouth 2 (two) times daily.           BP 122/77  Pulse 120  Resp 20  SpO2 93% Physical Exam  Nursing note and vitals reviewed. Constitutional: He is oriented to person, place, and time. He appears well-developed and well-nourished. He appears ill. No distress.  HENT:  Head: Normocephalic.  Cardiovascular: Regular rhythm and normal pulses.  Tachycardia present.   Pulmonary/Chest: Effort normal. No respiratory distress. He has wheezes (diffuse, audible without stethoscope).  Neurological: He is alert and oriented to person, place, and time. Coordination normal.  Skin: Skin is warm and dry. No rash noted. He is not diaphoretic.  Psychiatric: He has a normal mood and affect. Judgment normal.    ED Course  Procedures (including critical care time) Labs Review Labs Reviewed - No data to display Imaging Review Dg Chest 2 View  06/07/2013   CLINICAL DATA:  Left rib cage pain.  EXAM: CHEST  2 VIEW  COMPARISON:  None.  FINDINGS: There is no focal parenchymal opacity, pleural effusion, or pneumothorax. The heart and mediastinal contours are unremarkable. There is an old healed right posterior rib fracture.  IMPRESSION: No active  cardiopulmonary disease.   Electronically Signed   By: Elige Ko   On: 06/07/2013 09:07    EKG shows sinus tachycardia  MDM   1. Shortness of breath   2. Chest pain    Chest pain radiating to the back with wheezing, shortness of breath, lightheadedness. Differential includes ACS, aortic aneurysm, pneumothorax, severe COPD exacerbation, costochondritis. He needs evaluation in the emergency department and may require admission for glycemic control while treating the COPD exacerbation if that is all that is going on   Graylon Good, PA-C 06/08/13 1534

## 2013-06-08 NOTE — ED Notes (Signed)
Lab called to follow up on d-dimer results. Reports that they will look for blood and call back.

## 2013-06-08 NOTE — ED Provider Notes (Signed)
Medical screening examination/treatment/procedure(s) were performed by non-physician practitioner and as supervising physician I was immediately available for consultation/collaboration.  EKG Interpretation    Date/Time:  Friday June 08 2013 16:04:56 EST Ventricular Rate:  103 PR Interval:  154 QRS Duration: 93 QT Interval:  332 QTC Calculation: 434 R Axis:   94 Text Interpretation:  Sinus tachycardia Borderline right axis deviation ST elev, probable normal early repol pattern Since last tracing EARLIER SAME DATE Rate slower Otherwise no significant change Confirmed by Bernette Mayers  MD, CHARLES [phone]) on 06/08/2013 4:20:05 PM              Leonette Most B. Bernette Mayers, MD 06/08/13 2020

## 2013-06-08 NOTE — ED Notes (Signed)
Mini lab called to redraw

## 2013-06-08 NOTE — ED Notes (Signed)
Pt is a transfer from [facility] via [facility] for SOB and CP. Pt fell yesterday while walking in his house and hit his ribs on something. Pt was seen at [facility] yesterday and dx with bruised ribs. Pt was working today when pt states he felt his heart racing and starting hurting when he tried to breath. Pt went back to [facility] and was given a breathing treatment (0.5 atrovent and  5mg  albuterol) and stated he felt better. Pt is alert and oriented, nad noted at this time. VS 95% RA, 127/82

## 2013-06-08 NOTE — ED Provider Notes (Signed)
CSN: [id number]     Arrival date & time 06/08/13  1558 History   First MD Initiated Contact with Patient 06/08/13 1613     Chief Complaint  Patient presents with  . Shortness of Breath   (Consider location/radiation/quality/duration/timing/severity/associated sxs/prior Treatment) The history is provided by the patient and medical records. No language interpreter was used.    James [name] is a 57 y.o. 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 Pt reports he had a mechanical fall  2 days ago landing on his L side.  Today while at work [employer]) he began to have increased SOB, chest pain, wheezing, lightheadedness, cough. Pt reports he is using his inhaler more often with moderate relief but the SOB returns.  Pt reports lithotripsy 2 mos ago, but no other recent surgeries.  He denies other surgery, immobilization, long trips.  Pt is an everyday smoker (1 pack per [place]).  Albuterol makes it better and activity makes it worse.  Pt denies fever, chills, headache, neck pain, abd pain, N/V/D, syncope, dysuria.      Past Medical History  Diagnosis Date  . DIABETES MELLITUS, TYPE I 04/03/2007  . DIABETIC RETINOPATHY, PROLIFERATIVE 04/03/2007  . DM W/EYE MANIFESTATIONS, TYPE I, UNCONTROLLED 04/04/2007  . HYPERLIPIDEMIA 04/04/2007  . ANXIETY 04/03/2007  . DEPRESSION 04/03/2007  . ASTHMATIC BRONCHITIS, ACUTE 10/25/2008  . ASTHMA 09/06/2008  . ED (erectile dysfunction)   . Cervical disc disease   . Bladder neck obstruction   . COPD (chronic obstructive pulmonary disease)   . Spinal stenosis   . Depression   . Anxiety   . CORONARY ARTERY DISEASE 04/03/2007  . 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 RENAL INSUFFICIENCY 07/31/2007  . Renal insufficiency   . CARPAL TUNNEL  SYNDROME, BILATERAL 07/31/2007    issues resolved, no surgery   Past Surgical History  Procedure Laterality Date  . Ptca    . Spine surgery      s/p C-spine disc surgery-?fusion  . Appendectomy    . Stress cardiolite  09/06/2002  . Tonsillectomy    . Lymph node removed from groin  age 69  . Cystoscopy with retrograde pyelogram, ureteroscopy and stent placement Bilateral 04/06/2013    Procedure: BILATERAL CYSTOSCOPY WITH RETROGRADE PYELOGRAMS, STENT PLACEMENTS AND LEFT URETEROSCOPY AND STONE REMOVAL;  Surgeon: Sebastian Ache, MD;  Location: [facility];  Service: Urology;  Laterality: Bilateral;  . Holmium laser application Left 04/06/2013    Procedure: HOLMIUM LASER APPLICATION;  Surgeon: Sebastian Ache, MD;  Location: [facility];  Service: Urology;  Laterality: Left;  . Cystoscopy/retrograde/ureteroscopy Bilateral 04/12/2013    Procedure: CYSTOSCOPY/RETROGRADE/URETEROSCOPY;  Surgeon: Sebastian Ache, MD;  Location: [facility];  Service: Urology;  Laterality: Bilateral;  RIGHT RETROGRADE RIGHT URETEROSCOPY STONE EXTRACTION WITH BASKET LEFT STENTS REMOVED RIGHT STENTS REMOVED AND REPLACED     . Cystoscopy with stent placement Right 04/12/2013    Procedure: CYSTOSCOPY WITH STENT PLACEMENT;  Surgeon: Sebastian Ache, MD;  Location: [facility];  Service: Urology;  Laterality: Right;   Family History  Problem Relation Age of Onset  . Stroke Father     strong FH cerebrovascular disease  . Diabetes Brother   . Heart disease Brother     CHF  . Colon cancer Neg Hx    History  Substance  Use Topics  . Smoking status: Current Every [place] Smoker -- 1.00 packs/[place] for 38 years    Types: Cigarettes  . Smokeless tobacco: Never Used  . Alcohol Use: No    Review of Systems  Constitutional: Negative for fever, diaphoresis, appetite change, fatigue and unexpected weight change.  HENT: Negative for mouth sores.   Eyes: Negative for visual disturbance.  Respiratory: Positive for cough, chest tightness, shortness of  breath and wheezing.   Cardiovascular: Positive for chest pain and palpitations. Negative for leg swelling.  Gastrointestinal: Negative for nausea, vomiting, abdominal pain, diarrhea and constipation.  Endocrine: Negative for polydipsia, polyphagia and polyuria.  Genitourinary: Negative for dysuria, urgency, frequency and hematuria.  Musculoskeletal: Negative for back pain and neck stiffness.  Skin: Negative for rash.  Allergic/Immunologic: Negative for immunocompromised state.  Neurological: Positive for light-headedness. Negative for syncope and headaches.  Hematological: Does not bruise/bleed easily.  Psychiatric/Behavioral: Negative for sleep disturbance. The patient is not nervous/anxious.     Allergies  Iohexol and Lexapro  Home Medications   Current Outpatient Rx  Name  Route  Sig  Dispense  Refill  . acetaminophen (TYLENOL) 500 MG tablet   Oral   Take 1,000 mg by mouth every 6 (six) hours as needed for pain.         [name] albuterol (PROVENTIL HFA;VENTOLIN HFA) 108 (90 BASE) MCG/ACT inhaler   Inhalation   Inhale 2 puffs into the lungs every 6 (six) hours as needed for wheezing or shortness of breath.         [name] HYDROcodone-acetaminophen (NORCO) 5-325 MG per tablet   Oral   Take 1 tablet by mouth every 6 (six) hours as needed for moderate pain (or for cough).   30 tablet   0   . ibuprofen (ADVIL,MOTRIN) 200 MG tablet   Oral   Take 200 mg by mouth every 6 (six) hours as needed for pain.          [name] insulin glargine (LANTUS) 100 UNIT/ML injection   Subcutaneous   Inject 44 Units into the skin at bedtime.          . insulin lispro (HUMALOG KWIKPEN) 100 unit/mL SOLN   Subcutaneous   Inject 1-25 Units into the skin as directed. Pt uses per sliding scale and checks blood sugar 4-5 times daily BS 120 every 20 above 120 give 1 unit.         . simvastatin (ZOCOR) 40 MG tablet   Oral   Take 40 mg by mouth every evening.         [name] HYDROcodone-homatropine (HYCODAN)  5-1.5 MG/5ML syrup   Oral   Take 5 mLs by mouth every 6 (six) hours as needed for cough.   120 mL   0   . oxyCODONE-acetaminophen (PERCOCET/ROXICET) 5-325 MG per tablet   Oral   Take 1-2 tablets by mouth every 4 (four) hours as needed for severe pain.   21 tablet   0   . predniSONE (DELTASONE) 20 MG tablet   Oral   Take 2 tablets (40 mg total) by mouth daily.   10 tablet   0    BP 136/69  Pulse 103  Temp(Src) 98.5 F (36.9 C) (Oral)  Resp 27  Ht 5\' 9"  (1.753 m)  Wt 170 lb (77.111 kg)  BMI 25.09 kg/m2  SpO2 96% Physical Exam  Nursing note and vitals reviewed. Constitutional: He is oriented to person, place, and time. He appears well-developed and well-nourished. No distress.  Awake, alert, nontoxic appearance  HENT:  Head: Normocephalic and atraumatic.  Mouth/Throat: Oropharynx is clear and moist. No oropharyngeal exudate.  Eyes: Conjunctivae are normal. No scleral icterus.  Neck: Normal range of motion. Neck supple.  Cardiovascular: Normal rate, regular rhythm, normal heart sounds and intact distal pulses.   No murmur heard. Pulmonary/Chest: Accessory muscle usage present. No respiratory distress. He has decreased breath sounds. He has wheezes. He has no rhonchi. He has no rales.   He exhibits tenderness and bony tenderness (L ribs).    Expiratory wheezing Significant coughing Pain palpation of the left ribs in the axillary region, no flail segment, no crepitus, no ecchymosis, no palpable deformity  Abdominal: Soft. Bowel sounds are normal. He exhibits no distension. There is no tenderness. There is no rebound.  Musculoskeletal: Normal range of motion. He exhibits no edema.  Lymphadenopathy:    He has no cervical adenopathy.  Neurological: He is alert and oriented to person, place, and time. He exhibits normal muscle tone. Coordination normal.  Speech is clear and goal oriented Moves extremities without ataxia  Skin: Skin is warm and dry. He is not diaphoretic.  No erythema.  Psychiatric: He has a normal mood and affect. His behavior is normal.    ED Course  Procedures (including critical care time) Labs Review Labs Reviewed  CBC - Abnormal; Notable for the following:    MCHC 37.2 (*)    All other components within normal limits  BASIC METABOLIC PANEL - Abnormal; Notable for the following:    Glucose, Bld 69 (*)    All other components within normal limits  GLUCOSE, CAPILLARY - Abnormal; Notable for the following:    Glucose-Capillary 52 (*)    All other components within normal limits  TROPONIN I  D-DIMER, QUANTITATIVE   Imaging Review Dg Chest 2 View  06/07/2013   CLINICAL DATA:  Left rib cage pain.  EXAM: CHEST  2 VIEW  COMPARISON:  None.  FINDINGS: There is no focal parenchymal opacity, pleural effusion, or pneumothorax. The heart and mediastinal contours are unremarkable. There is an old healed right posterior rib fracture.  IMPRESSION: No active cardiopulmonary disease.   Electronically Signed   By: Elige Ko   On: 06/07/2013 09:07   Dg Ribs Unilateral W/chest Left  06/08/2013   CLINICAL DATA:  Fall two days ago with pain on anterior and posterior left side  EXAM: LEFT RIBS AND CHEST - 3+ VIEW  COMPARISON:  05/2013, 12/09/2012  FINDINGS: There are a few curvilinear lucencies in the left scapula laterally. A nondisplaced scapular fracture is possible. Minimal contour deformity of the anterior left 5th rib is appreciated. This is stable. An old posterior right 8th rib fractures stable. No pneumothorax or effusion. Lungs are clear.  IMPRESSION: The lucency in the scapula could represent an nutrient foramen, but given the history, the possibility of a nondisplaced scapular fracture cannot be excluded.   Electronically Signed   By: Esperanza Heir M.D.   On: 06/08/2013 17:35    EKG Interpretation    Date/Time:  Friday June 08 2013 16:04:56 EST Ventricular Rate:  103 PR Interval:  154 QRS Duration: 93 QT Interval:  332 QTC  Calculation: 434 R Axis:   94 Text Interpretation:  Sinus tachycardia Borderline right axis deviation ST elev, probable normal early repol pattern Since last tracing EARLIER SAME DATE Rate slower Otherwise no significant change Confirmed by Bernette Mayers  MD, CHARLES ([id number]) on 06/08/2013 4:20:05 PM  MDM   1. COPD exacerbation   2. Left rib fracture, closed, initial encounter   3. Tobacco use   4. CHEST WALL PAIN, ACUTE   5. DIABETES MELLITUS, TYPE I   6. CORONARY ARTERY DISEASE      Desi R Coote presents with L sided chest pain, SOB, coughing.  Patient with history of COPD and reports this feels like his usual exacerbations with increased left-sided pain after a fall.  D-dimer negative, he no leukocytosis, CMP unremarkable, troponin negative, ECG nonischemic. Doubt ACS.  Chest x-ray with contour deformity of the anterior left fifth rib and concern for nondisplaced scapular fracture. Clinically patient with full range of motion of the left arm and no pain to palpation, swelling or ecchymosis of the left scapula. Highly doubt scapula fracture.  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 Patient with complete resolution of shortness of breath and wheezing after 2 nebulizer treatments. Patient ambulated in ED with O2 saturations maintained >90, no current signs of respiratory distress. Lung exam improved after nebulizer treatment. Prednisone given in the ED and pt will bd dc with 5 Nolting burst. Discussed this with the patient prior to prescribing and gave my concerns for elevation in his glucose levels due to prednisone. He reports that steroids make the exacerbations that are and he will be able to control his glucose elevations with sliding scale insulin. Pt states they are breathing at baseline. Pt has been instructed to continue using prescribed medications and to speak with PCP about today's exacerbation.   It has been determined that no acute conditions requiring  further emergency intervention are present at this time. The patient/guardian have been advised of the diagnosis and plan. We have discussed signs and symptoms that warrant return to the ED, such as changes or worsening in symptoms.   Vital signs are stable at discharge; patient remains borderline tachycardic likely secondary to pain.  BP 136/69  Pulse 103  Temp(Src) 98.5 F (36.9 C) (Oral)  Resp 27  Ht 5\' 9"  (1.753 m)  Wt 170 lb (77.111 kg)  BMI 25.09 kg/m2  SpO2 96%  Patient/guardian has voiced understanding and agreed to follow-up with the PCP or specialist.         Dierdre Forth, PA-C 06/08/13 2017

## 2013-06-09 NOTE — ED Provider Notes (Signed)
Medical screening examination/treatment/procedure(s) were performed by a resident physician or non-physician practitioner and as the supervising physician I was immediately available for consultation/collaboration.  Clementeen Graham, MD    Rodolph Bong, MD 06/09/13 [phone]

## 2013-08-19 ENCOUNTER — Emergency Department (HOSPITAL_COMMUNITY): Payer: 59

## 2013-08-19 ENCOUNTER — Emergency Department (HOSPITAL_COMMUNITY)
Admission: EM | Admit: 2013-08-19 | Discharge: 2013-08-19 | Disposition: A | Discharge status: Home / Self Care | Payer: 59 | Attending: Emergency Medicine | Admitting: Emergency Medicine

## 2013-08-19 ENCOUNTER — Encounter (HOSPITAL_COMMUNITY): Payer: Self-pay | Admitting: Emergency Medicine

## 2013-08-19 DIAGNOSIS — Z79899 Other long term (current) drug therapy: Secondary | ICD-10-CM | POA: Insufficient documentation

## 2013-08-19 DIAGNOSIS — E109 Type 1 diabetes mellitus without complications: Secondary | ICD-10-CM | POA: Insufficient documentation

## 2013-08-19 DIAGNOSIS — Z23 Encounter for immunization: Secondary | ICD-10-CM | POA: Insufficient documentation

## 2013-08-19 DIAGNOSIS — F3289 Other specified depressive episodes: Secondary | ICD-10-CM | POA: Insufficient documentation

## 2013-08-19 DIAGNOSIS — IMO0002 Reserved for concepts with insufficient information to code with codable children: Secondary | ICD-10-CM | POA: Insufficient documentation

## 2013-08-19 DIAGNOSIS — Z794 Long term (current) use of insulin: Secondary | ICD-10-CM | POA: Insufficient documentation

## 2013-08-19 DIAGNOSIS — E785 Hyperlipidemia, unspecified: Secondary | ICD-10-CM | POA: Insufficient documentation

## 2013-08-19 DIAGNOSIS — J4489 Other specified chronic obstructive pulmonary disease: Secondary | ICD-10-CM | POA: Insufficient documentation

## 2013-08-19 DIAGNOSIS — I2510 Atherosclerotic heart disease of native coronary artery without angina pectoris: Secondary | ICD-10-CM | POA: Insufficient documentation

## 2013-08-19 DIAGNOSIS — Z8669 Personal history of other diseases of the nervous system and sense organs: Secondary | ICD-10-CM | POA: Insufficient documentation

## 2013-08-19 DIAGNOSIS — F329 Major depressive disorder, single episode, unspecified: Secondary | ICD-10-CM | POA: Insufficient documentation

## 2013-08-19 DIAGNOSIS — F17200 Nicotine dependence, unspecified, uncomplicated: Secondary | ICD-10-CM | POA: Insufficient documentation

## 2013-08-19 DIAGNOSIS — Z87448 Personal history of other diseases of urinary system: Secondary | ICD-10-CM | POA: Insufficient documentation

## 2013-08-19 DIAGNOSIS — J449 Chronic obstructive pulmonary disease, unspecified: Secondary | ICD-10-CM | POA: Insufficient documentation

## 2013-08-19 DIAGNOSIS — F411 Generalized anxiety disorder: Secondary | ICD-10-CM | POA: Insufficient documentation

## 2013-08-19 LAB — GLUCOSE, CAPILLARY: Glucose-Capillary: 115 mg/dL — ABNORMAL HIGH (ref 70–99)

## 2013-08-19 MED ORDER — OXYCODONE-ACETAMINOPHEN 5-325 MG PO TABS
1.0000 | ORAL_TABLET | Freq: Once | ORAL | Status: AC
  Administered 2013-08-19: 1 via ORAL
  Filled 2013-08-19: qty 1

## 2013-08-19 MED ORDER — OXYCODONE-ACETAMINOPHEN 5-325 MG PO TABS: 1.0000 | ORAL_TABLET | Freq: Four times a day (QID) | ORAL | Status: DC | PRN

## 2013-08-19 MED ORDER — TETANUS-DIPHTH-ACELL PERTUSSIS 5-2.5-18.5 LF-MCG/0.5 IM SUSP
0.5000 mL | Freq: Once | INTRAMUSCULAR | Status: AC
  Administered 2013-08-19: 0.5 mL via INTRAMUSCULAR
  Filled 2013-08-19: qty 0.5
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
## 2013-08-19 NOTE — ED Provider Notes (Signed)
LACERATION REPAIR Performed by: Larene Pickett Authorized by: Larene Pickett Consent: Verbal consent obtained. Risks and benefits: risks, benefits and alternatives were discussed Consent given by: patient Patient identity confirmed: provided demographic data Prepped and Draped in normal sterile fashion Wound explored  Laceration Location: left cheek  Laceration Length: 3cm  No Foreign Bodies seen or palpated  Anesthesia: local infiltration  Local anesthetic: none  Anesthetic total: 0 ml  Irrigation method: syringe Amount of cleaning: standard  Skin closure: dermbond  Number of sutures: 0  Technique: n/a  Patient tolerance: Patient tolerated the procedure well with no immediate complications.   Larene Pickett, PA-C 08/19/13 [phone]

## 2013-08-19 NOTE — ED Notes (Signed)
Ice chips / ice pack given to pt./ waiting for CT scan and  x-ray.
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
## 2013-08-20 NOTE — ED Provider Notes (Signed)
Medical screening examination/treatment/procedure(s) were conducted as a shared visit with non-physician practitioner(s) and myself.  I personally evaluated the patient during the encounter.  EKG Interpretation   None      Ms Baird Cancer performed laceration repair only  Orlie Dakin, MD 08/20/13 [phone]

## 2013-09-19 ENCOUNTER — Ambulatory Visit (INDEPENDENT_AMBULATORY_CARE_PROVIDER_SITE_OTHER): Payer: Commercial Managed Care - PPO | Admitting: Ophthalmology

## 2013-09-19 DIAGNOSIS — E11359 Type 2 diabetes mellitus with proliferative diabetic retinopathy without macular edema: Secondary | ICD-10-CM

## 2013-09-19 DIAGNOSIS — E1165 Type 2 diabetes mellitus with hyperglycemia: Secondary | ICD-10-CM

## 2013-09-19 DIAGNOSIS — H43819 Vitreous degeneration, unspecified eye: Secondary | ICD-10-CM

## 2013-09-19 DIAGNOSIS — E1139 Type 2 diabetes mellitus with other diabetic ophthalmic complication: Secondary | ICD-10-CM

## 2014-02-12 ENCOUNTER — Emergency Department (HOSPITAL_COMMUNITY): Payer: 59

## 2014-02-12 ENCOUNTER — Encounter (HOSPITAL_COMMUNITY): Payer: Self-pay | Admitting: Emergency Medicine

## 2014-02-12 ENCOUNTER — Inpatient Hospital Stay (HOSPITAL_COMMUNITY)
Admission: EM | Admit: 2014-02-12 | Discharge: 2014-02-16 | DRG: 194 | Disposition: A | Discharge status: Home / Self Care | Payer: 59 | Attending: Endocrinology | Admitting: Endocrinology

## 2014-02-12 ENCOUNTER — Emergency Department (HOSPITAL_COMMUNITY)
Admission: EM | Admit: 2014-02-12 | Discharge: 2014-02-12 | Disposition: A | Discharge status: Home / Self Care | Payer: 59 | Attending: Emergency Medicine | Admitting: Emergency Medicine

## 2014-02-12 DIAGNOSIS — R0902 Hypoxemia: Secondary | ICD-10-CM

## 2014-02-12 DIAGNOSIS — J159 Unspecified bacterial pneumonia: Secondary | ICD-10-CM | POA: Insufficient documentation

## 2014-02-12 DIAGNOSIS — Z87448 Personal history of other diseases of urinary system: Secondary | ICD-10-CM | POA: Insufficient documentation

## 2014-02-12 DIAGNOSIS — E785 Hyperlipidemia, unspecified: Secondary | ICD-10-CM | POA: Diagnosis present

## 2014-02-12 DIAGNOSIS — E1039 Type 1 diabetes mellitus with other diabetic ophthalmic complication: Secondary | ICD-10-CM | POA: Insufficient documentation

## 2014-02-12 DIAGNOSIS — R0602 Shortness of breath: Secondary | ICD-10-CM | POA: Insufficient documentation

## 2014-02-12 DIAGNOSIS — S2249XA Multiple fractures of ribs, unspecified side, initial encounter for closed fracture: Secondary | ICD-10-CM | POA: Insufficient documentation

## 2014-02-12 DIAGNOSIS — I2510 Atherosclerotic heart disease of native coronary artery without angina pectoris: Secondary | ICD-10-CM | POA: Diagnosis present

## 2014-02-12 DIAGNOSIS — F411 Generalized anxiety disorder: Secondary | ICD-10-CM | POA: Diagnosis present

## 2014-02-12 DIAGNOSIS — S2242XA Multiple fractures of ribs, left side, initial encounter for closed fracture: Secondary | ICD-10-CM

## 2014-02-12 DIAGNOSIS — W010XXA Fall on same level from slipping, tripping and stumbling without subsequent striking against object, initial encounter: Secondary | ICD-10-CM | POA: Insufficient documentation

## 2014-02-12 DIAGNOSIS — Z79899 Other long term (current) drug therapy: Secondary | ICD-10-CM | POA: Insufficient documentation

## 2014-02-12 DIAGNOSIS — J189 Pneumonia, unspecified organism: Principal | ICD-10-CM

## 2014-02-12 DIAGNOSIS — N529 Male erectile dysfunction, unspecified: Secondary | ICD-10-CM | POA: Diagnosis present

## 2014-02-12 DIAGNOSIS — F3289 Other specified depressive episodes: Secondary | ICD-10-CM | POA: Diagnosis present

## 2014-02-12 DIAGNOSIS — Z91041 Radiographic dye allergy status: Secondary | ICD-10-CM

## 2014-02-12 DIAGNOSIS — Z794 Long term (current) use of insulin: Secondary | ICD-10-CM

## 2014-02-12 DIAGNOSIS — E11359 Type 2 diabetes mellitus with proliferative diabetic retinopathy without macular edema: Secondary | ICD-10-CM | POA: Diagnosis present

## 2014-02-12 DIAGNOSIS — F17200 Nicotine dependence, unspecified, uncomplicated: Secondary | ICD-10-CM | POA: Insufficient documentation

## 2014-02-12 DIAGNOSIS — J690 Pneumonitis due to inhalation of food and vomit: Secondary | ICD-10-CM | POA: Diagnosis present

## 2014-02-12 DIAGNOSIS — IMO0001 Reserved for inherently not codable concepts without codable children: Secondary | ICD-10-CM | POA: Insufficient documentation

## 2014-02-12 DIAGNOSIS — D649 Anemia, unspecified: Secondary | ICD-10-CM | POA: Diagnosis present

## 2014-02-12 DIAGNOSIS — Y939 Activity, unspecified: Secondary | ICD-10-CM | POA: Insufficient documentation

## 2014-02-12 DIAGNOSIS — R109 Unspecified abdominal pain: Secondary | ICD-10-CM | POA: Insufficient documentation

## 2014-02-12 DIAGNOSIS — IMO0002 Reserved for concepts with insufficient information to code with codable children: Secondary | ICD-10-CM | POA: Insufficient documentation

## 2014-02-12 DIAGNOSIS — J45909 Unspecified asthma, uncomplicated: Secondary | ICD-10-CM | POA: Diagnosis present

## 2014-02-12 DIAGNOSIS — Z9861 Coronary angioplasty status: Secondary | ICD-10-CM | POA: Insufficient documentation

## 2014-02-12 DIAGNOSIS — Y929 Unspecified place or not applicable: Secondary | ICD-10-CM | POA: Insufficient documentation

## 2014-02-12 DIAGNOSIS — J441 Chronic obstructive pulmonary disease with (acute) exacerbation: Secondary | ICD-10-CM | POA: Insufficient documentation

## 2014-02-12 DIAGNOSIS — S2232XS Fracture of one rib, left side, sequela: Secondary | ICD-10-CM

## 2014-02-12 DIAGNOSIS — F329 Major depressive disorder, single episode, unspecified: Secondary | ICD-10-CM | POA: Insufficient documentation

## 2014-02-12 DIAGNOSIS — J45901 Unspecified asthma with (acute) exacerbation: Secondary | ICD-10-CM | POA: Insufficient documentation

## 2014-02-12 DIAGNOSIS — M5090 Cervical disc disorder, unspecified, unspecified cervical region: Secondary | ICD-10-CM | POA: Insufficient documentation

## 2014-02-12 DIAGNOSIS — E11319 Type 2 diabetes mellitus with unspecified diabetic retinopathy without macular edema: Secondary | ICD-10-CM | POA: Insufficient documentation

## 2014-02-12 DIAGNOSIS — E875 Hyperkalemia: Secondary | ICD-10-CM | POA: Diagnosis present

## 2014-02-12 HISTORY — DX: Unspecified convulsions: R56.9

## 2014-02-12 HISTORY — DX: Pneumonia, unspecified organism: J18.9

## 2014-02-12 HISTORY — DX: Diabetes mellitus due to underlying condition with unspecified diabetic retinopathy without macular edema: E08.319

## 2014-02-12 HISTORY — DX: Unspecified chronic bronchitis: J42

## 2014-02-12 LAB — CBC
HEMATOCRIT: 38.5 % — AB (ref 39.0–52.0)
Hemoglobin: 13.8 g/dL (ref 13.0–17.0)
MCH: 32.7 pg (ref 26.0–34.0)
MCHC: 35.8 g/dL (ref 30.0–36.0)
MCV: 91.2 fL (ref 78.0–100.0)
PLATELETS: 245 10*3/uL (ref 150–400)
RBC: 4.22 MIL/uL (ref 4.22–5.81)
RDW: 12.6 % (ref 11.5–15.5)
WBC: 9.3 10*3/uL (ref 4.0–10.5)

## 2014-02-12 LAB — CBC WITH DIFFERENTIAL/PLATELET
BASOS ABS: 0 10*3/uL (ref 0.0–0.1)
Basophils Relative: 0 % (ref 0–1)
EOS PCT: 1 % (ref 0–5)
Eosinophils Absolute: 0.1 10*3/uL (ref 0.0–0.7)
HCT: 39.1 % (ref 39.0–52.0)
Hemoglobin: 14.3 g/dL (ref 13.0–17.0)
Lymphocytes Relative: 5 % — ABNORMAL LOW (ref 12–46)
Lymphs Abs: 0.5 10*3/uL — ABNORMAL LOW (ref 0.7–4.0)
MCH: 33 pg (ref 26.0–34.0)
MCHC: 36.6 g/dL — ABNORMAL HIGH (ref 30.0–36.0)
MCV: 90.3 fL (ref 78.0–100.0)
Monocytes Absolute: 0.5 10*3/uL (ref 0.1–1.0)
Monocytes Relative: 5 % (ref 3–12)
NEUTROS PCT: 90 % — AB (ref 43–77)
Neutro Abs: 8.8 10*3/uL — ABNORMAL HIGH (ref 1.7–7.7)
Platelets: 252 10*3/uL (ref 150–400)
RBC: 4.33 MIL/uL (ref 4.22–5.81)
RDW: 12.4 % (ref 11.5–15.5)
WBC: 9.9 10*3/uL (ref 4.0–10.5)

## 2014-02-12 LAB — BASIC METABOLIC PANEL
ANION GAP: 15 (ref 5–15)
ANION GAP: 18 — AB (ref 5–15)
BUN: 17 mg/dL (ref 6–23)
BUN: 19 mg/dL (ref 6–23)
CALCIUM: 8.5 mg/dL (ref 8.4–10.5)
CHLORIDE: 103 meq/L (ref 96–112)
CO2: 23 mEq/L (ref 19–32)
CO2: 19 meq/L (ref 19–32)
Calcium: 8.5 mg/dL (ref 8.4–10.5)
Chloride: 103 mEq/L (ref 96–112)
Creatinine, Ser: 0.74 mg/dL (ref 0.50–1.35)
Creatinine, Ser: 0.65 mg/dL (ref 0.50–1.35)
GFR calc Af Amer: >90 mL/min (ref >90)
GFR calc non Af Amer: >90 mL/min (ref >90)
Glucose, Bld: 291 mg/dL — ABNORMAL HIGH (ref 70–99)
Glucose, Bld: 216 mg/dL — ABNORMAL HIGH (ref 70–99)
POTASSIUM: 4 meq/L (ref 3.7–5.3)
Potassium: 5.4 mEq/L — ABNORMAL HIGH (ref 3.7–5.3)
SODIUM: 140 meq/L (ref 137–147)
Sodium: 141 mEq/L (ref 137–147)

## 2014-02-12 LAB — GLUCOSE, CAPILLARY
Glucose-Capillary: 225 mg/dL — ABNORMAL HIGH (ref 70–99)
Glucose-Capillary: 181 mg/dL — ABNORMAL HIGH (ref 70–99)

## 2014-02-12 LAB — CBG MONITORING, ED
GLUCOSE-CAPILLARY: 203 mg/dL — AB (ref 70–99)
GLUCOSE-CAPILLARY: 258 mg/dL — AB (ref 70–99)
Glucose-Capillary: 256 mg/dL — ABNORMAL HIGH (ref 70–99)

## 2014-02-12 LAB — MRSA PCR SCREENING: MRSA by PCR: NEGATIVE

## 2014-02-12 MED ORDER — LEVOFLOXACIN 750 MG PO TABS: 750.0000 mg | ORAL_TABLET | Freq: Every day | ORAL | Status: DC

## 2014-02-12 MED ORDER — DIAZEPAM 5 MG/ML IJ SOLN
5.0000 mg | Freq: Once | INTRAMUSCULAR | Status: AC
  Administered 2014-02-12: 5 mg via INTRAVENOUS
  Filled 2014-02-12: qty 2

## 2014-02-12 MED ORDER — ALBUTEROL SULFATE (2.5 MG/3ML) 0.083% IN NEBU: 2.5000 mg | INHALATION_SOLUTION | RESPIRATORY_TRACT | Status: DC | PRN

## 2014-02-12 MED ORDER — INSULIN PUMP
Freq: Three times a day (TID) | SUBCUTANEOUS | Status: DC
Start: 2014-02-12 — End: 2014-02-16
  Administered 2014-02-12: 23:00:00 via SUBCUTANEOUS
  Administered 2014-02-13: 1.2 via SUBCUTANEOUS
  Administered 2014-02-13: 2.3 via SUBCUTANEOUS
  Administered 2014-02-13: 2.4 via SUBCUTANEOUS
  Administered 2014-02-13 (×2): via SUBCUTANEOUS
  Administered 2014-02-14: 2.5 via SUBCUTANEOUS
  Administered 2014-02-14: 1.4 via SUBCUTANEOUS
  Administered 2014-02-15 – 2014-02-16 (×4): via SUBCUTANEOUS
  Filled 2014-02-12: qty 1

## 2014-02-12 MED ORDER — MORPHINE SULFATE 2 MG/ML IJ SOLN
2.0000 mg | INTRAMUSCULAR | Status: DC | PRN
  Administered 2014-02-12 – 2014-02-16 (×8): 2 mg via INTRAVENOUS
  Filled 2014-02-12 (×9): qty 1

## 2014-02-12 MED ORDER — ENOXAPARIN SODIUM 40 MG/0.4ML ~~LOC~~ SOLN
40.0000 mg | SUBCUTANEOUS | Status: DC
  Administered 2014-02-12 – 2014-02-15 (×4): 40 mg via SUBCUTANEOUS
  Filled 2014-02-12 (×5): qty 0.4

## 2014-02-12 MED ORDER — ONDANSETRON HCL 4 MG/2ML IJ SOLN: 4.0000 mg | Freq: Four times a day (QID) | INTRAMUSCULAR | Status: DC | PRN

## 2014-02-12 MED ORDER — FENTANYL CITRATE 0.05 MG/ML IJ SOLN
100.0000 ug | Freq: Once | INTRAMUSCULAR | Status: AC
  Administered 2014-02-12: 100 ug via INTRAVENOUS
  Filled 2014-02-12: qty 2

## 2014-02-12 MED ORDER — LEVOFLOXACIN 750 MG PO TABS
750.0000 mg | ORAL_TABLET | Freq: Once | ORAL | Status: AC
  Administered 2014-02-12: 750 mg via ORAL
  Filled 2014-02-12: qty 1

## 2014-02-12 MED ORDER — HYDROCODONE-ACETAMINOPHEN 5-325 MG PO TABS: 2.0000 | ORAL_TABLET | ORAL | Status: DC | PRN

## 2014-02-12 MED ORDER — HYDROMORPHONE HCL PF 1 MG/ML IJ SOLN
1.0000 mg | Freq: Once | INTRAMUSCULAR | Status: AC
  Administered 2014-02-12: 1 mg via INTRAVENOUS
  Filled 2014-02-12: qty 1

## 2014-02-12 MED ORDER — KETOROLAC TROMETHAMINE 15 MG/ML IJ SOLN
15.0000 mg | Freq: Once | INTRAMUSCULAR | Status: AC
  Administered 2014-02-12: 15 mg via INTRAVENOUS
  Filled 2014-02-12: qty 1

## 2014-02-12 MED ORDER — SODIUM CHLORIDE 0.9 % IJ SOLN
3.0000 mL | Freq: Two times a day (BID) | INTRAMUSCULAR | Status: DC
  Administered 2014-02-12 – 2014-02-16 (×6): 3 mL via INTRAVENOUS

## 2014-02-12 MED ORDER — INSULIN PUMP
SUBCUTANEOUS | Status: DC
  Filled 2014-02-12: qty 1

## 2014-02-12 MED ORDER — SIMVASTATIN 40 MG PO TABS
40.0000 mg | ORAL_TABLET | Freq: Every evening | ORAL | Status: DC
  Administered 2014-02-12 – 2014-02-15 (×4): 40 mg via ORAL
  Filled 2014-02-12 (×5): qty 1

## 2014-02-12 MED ORDER — POTASSIUM CHLORIDE IN NACL 20-0.9 MEQ/L-% IV SOLN
INTRAVENOUS | Status: DC
  Administered 2014-02-12 – 2014-02-14 (×4): via INTRAVENOUS
  Filled 2014-02-12 (×8): qty 1000

## 2014-02-12 MED ORDER — ZOLPIDEM TARTRATE 5 MG PO TABS: 5.0000 mg | ORAL_TABLET | Freq: Every evening | ORAL | Status: DC | PRN

## 2014-02-12 MED ORDER — ALPRAZOLAM 0.5 MG PO TABS
0.5000 mg | ORAL_TABLET | Freq: Three times a day (TID) | ORAL | Status: DC | PRN
  Administered 2014-02-16: 0.5 mg via ORAL
  Filled 2014-02-12: qty 1

## 2014-02-12 MED ORDER — ONDANSETRON HCL 4 MG PO TABS: 4.0000 mg | ORAL_TABLET | Freq: Four times a day (QID) | ORAL | Status: DC | PRN

## 2014-02-12 MED ORDER — SODIUM CHLORIDE 0.9 % IV BOLUS (SEPSIS)
500.0000 mL | Freq: Once | INTRAVENOUS | Status: AC
  Administered 2014-02-12: 500 mL via INTRAVENOUS

## 2014-02-12 MED ORDER — ACETAMINOPHEN 650 MG RE SUPP: 650.0000 mg | Freq: Four times a day (QID) | RECTAL | Status: DC | PRN

## 2014-02-12 MED ORDER — VENLAFAXINE HCL ER 75 MG PO CP24
75.0000 mg | ORAL_CAPSULE | Freq: Every day | ORAL | Status: DC
  Administered 2014-02-13 – 2014-02-16 (×4): 75 mg via ORAL
  Filled 2014-02-12 (×5): qty 1

## 2014-02-12 MED ORDER — MOMETASONE FURO-FORMOTEROL FUM 100-5 MCG/ACT IN AERO
2.0000 | INHALATION_SPRAY | Freq: Two times a day (BID) | RESPIRATORY_TRACT | Status: DC
  Administered 2014-02-13 – 2014-02-16 (×7): 2 via RESPIRATORY_TRACT
  Filled 2014-02-12: qty 8.8

## 2014-02-12 MED ORDER — ACETAMINOPHEN 325 MG PO TABS: 650.0000 mg | ORAL_TABLET | Freq: Four times a day (QID) | ORAL | Status: DC | PRN

## 2014-02-12 MED ORDER — SODIUM CHLORIDE 0.9 % IV BOLUS (SEPSIS)
1000.0000 mL | Freq: Once | INTRAVENOUS | Status: AC
  Administered 2014-02-12: 1000 mL via INTRAVENOUS

## 2014-02-12 MED ORDER — ALBUTEROL SULFATE (2.5 MG/3ML) 0.083% IN NEBU
5.0000 mg | INHALATION_SOLUTION | Freq: Once | RESPIRATORY_TRACT | Status: AC
  Administered 2014-02-12: 5 mg via RESPIRATORY_TRACT
  Filled 2014-02-12: qty 6

## 2014-02-12 MED ORDER — LEVOFLOXACIN 750 MG PO TABS
750.0000 mg | ORAL_TABLET | Freq: Every day | ORAL | Status: DC
  Administered 2014-02-13 – 2014-02-16 (×4): 750 mg via ORAL
  Filled 2014-02-12 (×4): qty 1

## 2014-02-12 MED ORDER — OXYCODONE HCL 5 MG PO TABS
5.0000 mg | ORAL_TABLET | ORAL | Status: DC | PRN
  Administered 2014-02-12 – 2014-02-16 (×14): 5 mg via ORAL
  Filled 2014-02-12 (×14): qty 1

## 2014-02-12 MED ORDER — IPRATROPIUM BROMIDE 0.02 % IN SOLN
0.5000 mg | Freq: Once | RESPIRATORY_TRACT | Status: AC
  Administered 2014-02-12: 0.5 mg via RESPIRATORY_TRACT
  Filled 2014-02-12: qty 2.5

## 2014-02-12 MED ORDER — SENNA 8.6 MG PO TABS
1.0000 | ORAL_TABLET | Freq: Two times a day (BID) | ORAL | Status: DC
  Administered 2014-02-12 – 2014-02-16 (×6): 8.6 mg via ORAL
  Filled 2014-02-12 (×9): qty 1

## 2014-02-12 MED ORDER — IBUPROFEN 400 MG PO TABS
400.0000 mg | ORAL_TABLET | Freq: Four times a day (QID) | ORAL | Status: DC | PRN
  Filled 2014-02-12: qty 1

## 2014-02-12 NOTE — ED Notes (Signed)
Pt O2 increased from 2 - 3L Nasal Cannula.  Upon pt arrival from CT, pt appeared mildly more short of breath.  Pt states "I experienced a period of wheezing, feeling like something came loose".  Pt is alert and oriented and able to speak with mild shortness of breath.  O2 of 92% on 3L.

## 2014-02-12 NOTE — ED Notes (Signed)
Pt refuses to allow his IV to be removed and be discharged until the doctor speaks to him.

## 2014-02-12 NOTE — Discharge Instructions (Signed)
If you were given medicines take as directed.  If you are on coumadin or contraceptives realize their levels and effectiveness is altered by many different medicines.  If you have any reaction (rash, tongues swelling, other) to the medicines stop taking and see a physician.   Use spirometer regularly.  For severe pain take norco or vicodin however realize they have the potential for addiction and it can make you sleepy and has tylenol in it.  No operating machinery while taking. \ Please follow up as directed and return to the ER or see a physician for new or worsening symptoms.  Thank you. Filed Vitals:   02/12/14 0545 02/12/14 0600 02/12/14 0615 02/12/14 0630  BP: 154/91 153/68 148/73 159/70  Pulse: 84 88 92 89  Temp:      TempSrc:      Resp: 16 19 23 22   Height:      Weight:      SpO2: 94% 96% 95% 94%

## 2014-02-12 NOTE — ED Notes (Signed)
Pt placed on 2L Nasal Cannula with an original O2 saturation of 89% after EMS departure.  Pt tolerating Nasal Cannula with 96%.

## 2014-02-12 NOTE — ED Notes (Signed)
CBG 203 

## 2014-02-12 NOTE — ED Provider Notes (Signed)
CSN: [id number]     Arrival date & time 02/12/14  0413 History   First MD Initiated Contact with Patient 02/12/14 0435     Chief Complaint  Patient presents with  . Shortness of Breath  . Muscle Pain     (Consider location/radiation/quality/duration/timing/severity/associated sxs/prior Treatment) HPI Comments: 58 year old male with history of diabetes, asthma, COPD, CAD presents with left posterior flank pain at the site of rib fracture since Wednesday. Patient was seen and had fifth rib fracture after slipped and lost footing causing him to fall his back. Patient's had gradually worsening pain since and Percocet only mildly helping. Patient has mild cough, no fevers or chills. Patient denies chest pain. Patient is mild shortness of breath  Patient is a 58 y.o. male presenting with shortness of breath and musculoskeletal pain. The history is provided by the patient.  Shortness of Breath Associated symptoms: cough   Associated symptoms: no abdominal pain, no chest pain, no fever, no headaches, no neck pain, no rash and no vomiting   Muscle Pain Associated symptoms include shortness of breath. Pertinent negatives include no chest pain, no abdominal pain and no headaches.    Past Medical History  Diagnosis Date  . DIABETES MELLITUS, TYPE I 04/03/2007  . DIABETIC RETINOPATHY, PROLIFERATIVE 04/03/2007  . DM W/EYE MANIFESTATIONS, TYPE I, UNCONTROLLED 04/04/2007  . HYPERLIPIDEMIA 04/04/2007  . ANXIETY 04/03/2007  . DEPRESSION 04/03/2007  . ASTHMATIC BRONCHITIS, ACUTE 10/25/2008  . ASTHMA 09/06/2008  . ED (erectile dysfunction)   . Cervical disc disease   . Bladder neck obstruction   . COPD (chronic obstructive pulmonary disease)   . Spinal stenosis   . Depression   . Anxiety   . CORONARY ARTERY DISEASE 04/03/2007  . 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 RENAL INSUFFICIENCY 07/31/2007  . Renal insufficiency   . CARPAL TUNNEL SYNDROME, BILATERAL 07/31/2007    issues resolved, no  surgery   Past Surgical History  Procedure Laterality Date  . Ptca    . Spine surgery      s/p C-spine disc surgery-?fusion  . Appendectomy    . Stress cardiolite  09/06/2002  . Tonsillectomy    . Lymph node removed from groin  age 65  . Cystoscopy with retrograde pyelogram, ureteroscopy and stent placement Bilateral 04/06/2013    Procedure: BILATERAL CYSTOSCOPY WITH RETROGRADE PYELOGRAMS, STENT PLACEMENTS AND LEFT URETEROSCOPY AND STONE REMOVAL;  Surgeon: Alexis Frock, MD;  Location: [facility];  Service: Urology;  Laterality: Bilateral;  . Holmium laser application Left 5/46/5035    Procedure: HOLMIUM LASER APPLICATION;  Surgeon: Alexis Frock, MD;  Location: [facility];  Service: Urology;  Laterality: Left;  . Cystoscopy/retrograde/ureteroscopy Bilateral 04/12/2013    Procedure: CYSTOSCOPY/RETROGRADE/URETEROSCOPY;  Surgeon: Alexis Frock, MD;  Location: [facility];  Service: Urology;  Laterality: Bilateral;  RIGHT RETROGRADE RIGHT URETEROSCOPY STONE EXTRACTION WITH BASKET LEFT STENTS REMOVED RIGHT STENTS REMOVED AND REPLACED     . Cystoscopy with stent placement Right 04/12/2013    Procedure: CYSTOSCOPY WITH STENT PLACEMENT;  Surgeon: Alexis Frock, MD;  Location: [facility];  Service: Urology;  Laterality: Right;   Family History  Problem Relation Age of Onset  . Stroke Father     strong FH cerebrovascular disease  . Diabetes Brother   . Heart disease Brother     CHF  . Colon cancer Neg Hx    History  Substance Use Topics  . Smoking status: Current Every [place] Smoker -- 1.00 packs/[place] for 38 years    Types:  Cigarettes  . Smokeless tobacco: Never Used  . Alcohol Use: No    Review of Systems  Constitutional: Negative for fever and chills.  HENT: Negative for congestion.   Eyes: Negative for visual disturbance.  Respiratory: Positive for cough and shortness of breath.   Cardiovascular: Negative for chest pain and leg swelling.  Gastrointestinal: Negative for vomiting and abdominal  pain.  Genitourinary: Negative for dysuria and flank pain.  Musculoskeletal: Positive for arthralgias and back pain. Negative for neck pain and neck stiffness.  Skin: Negative for rash.  Neurological: Negative for light-headedness and headaches.      Allergies  Iohexol and Lexapro  Home Medications   Prior to Admission medications   Medication Sig Start Date End Date Taking? Authorizing Provider  acetaminophen (TYLENOL) 500 MG tablet Take 1,000 mg by mouth every 6 (six) hours as needed for pain.    Historical Provider, MD  albuterol (PROVENTIL HFA;VENTOLIN HFA) 108 (90 BASE) MCG/ACT inhaler Inhale 2 puffs into the lungs every 6 (six) hours as needed for wheezing or shortness of breath.    Historical Provider, MD  diphenhydrAMINE (BENADRYL) 25 MG tablet Take 50 mg by mouth at bedtime as needed for sleep.    Historical Provider, MD  ibuprofen (ADVIL,MOTRIN) 200 MG tablet Take 600 mg by mouth every 6 (six) hours as needed for moderate pain.    Historical Provider, MD  insulin glargine (LANTUS) 100 UNIT/ML injection Inject 44 Units into the skin at bedtime.     Historical Provider, MD  insulin lispro (HUMALOG KWIKPEN) 100 unit/mL SOLN Inject 1-25 Units into the skin as directed. Pt uses per sliding scale and checks blood sugar 4-5 times daily BS 120 every 20 above 120 give 1 unit.    Historical Provider, MD  mometasone-formoterol (DULERA) 100-5 MCG/ACT AERO Inhale 2 puffs into the lungs 2 (two) times daily.    Historical Provider, MD  oxyCODONE-acetaminophen (PERCOCET) 5-325 MG per tablet Take 1 tablet by mouth every 6 (six) hours as needed. 08/19/13   Orlie Dakin, MD  simvastatin (ZOCOR) 40 MG tablet Take 40 mg by mouth every evening.    Historical Provider, MD  traZODone (DESYREL) 100 MG tablet Take 100 mg by mouth at bedtime.    Historical Provider, MD  venlafaxine XR (EFFEXOR-XR) 75 MG 24 hr capsule Take 75 mg by mouth daily with breakfast.    Historical Provider, MD   BP 147/79  Pulse  78  Temp(Src) 98.4 F (36.9 C) (Oral)  Resp 20  Ht 5\' 9"  (1.753 m)  Wt 170 lb (77.111 kg)  BMI 25.09 kg/m2  SpO2 95% Physical Exam  Nursing note and vitals reviewed. Constitutional: He is oriented to person, place, and time. He appears well-developed and well-nourished.  HENT:  Head: Normocephalic and atraumatic.  Eyes: Conjunctivae are normal. Right eye exhibits no discharge. Left eye exhibits no discharge.  Neck: Normal range of motion. Neck supple. No tracheal deviation present.  Cardiovascular: Normal rate and regular rhythm.   Pulmonary/Chest: Effort normal. He has wheezes (expiratory mild bilateral, mild tachypnea). He has rales (few at bases).  Abdominal: Soft. He exhibits no distension. There is no tenderness. There is no guarding.  Musculoskeletal: He exhibits tenderness. He exhibits no edema.  Patient has moderate to severe tenderness to palpation left posterior mid thoracic region, no signs of infection.  Neurological: He is alert and oriented to person, place, and time.  Skin: Skin is warm. No rash noted.  Psychiatric: He has a normal mood and affect.  ED Course  Procedures (including critical care time) Labs Review Labs Reviewed  CBC - Abnormal; Notable for the following:    HCT 38.5 (*)    All other components within normal limits  CBG MONITORING, ED - Abnormal; Notable for the following:    Glucose-Capillary 256 (*)    All other components within normal limits  BASIC METABOLIC PANEL    Imaging Review Dg Chest 2 View  02/12/2014   CLINICAL DATA:  Rib fracture, worsening pain.  EXAM: CHEST  2 VIEW  COMPARISON:  Chest radiograph June 08, 2013  FINDINGS: Acute appearing minimally displaced left lateral fifth and seventh (possibly 6th ) rib fractures. Additional chronic left anterior fifth rib fracture, right posterior rib fractures. No pneumothorax. Patchy left lower lobe airspace opacity with trace left pleural effusion. Cardiac silhouette is shifted to left.  Mediastinal silhouette is nonsuspicious, mildly calcified aortic knob.  Status post knee ACDF.  IMPRESSION: Acute left lateral fifth, seventh and possibly sixth rib fractures without radiographic findings of pneumothorax. Trace left pleural effusion versus hemothorax with left lung base atelectasis, less likely contusion.   Electronically Signed   By: Elon Alas   On: 02/12/2014 05:29   Ct Chest Wo Contrast  02/12/2014   CLINICAL DATA:  Recent left rib fractures now with increasing shortness of breath  EXAM: CT CHEST WITHOUT CONTRAST  TECHNIQUE: Multidetector CT imaging of the chest was performed following the standard protocol without IV contrast.  COMPARISON:  PA and lateral chest x-ray of February 12, 2014  FINDINGS: The right lung is well-expanded and clear. On the left there is mild volume loss which use in part congenital. There is consolidation of posterior left lower lobe parenchyma and there is a small left pleural effusion layering posteriorly. There is no pneumothorax or pneumomediastinum.  The cardiac chambers are normal in size. There are coronary artery calcifications. The thoracic esophagus and thoracic aorta are unremarkable. Fractures of the lateral aspects of the seventh, eighth, and ninth ribs are demonstrated. The thoracic vertebral bodies are preserved in height. There is degenerative disc change with osteophytes at T10-T11. The sternum is normal.  Within the upper abdomen the observed portions of the liver and spleen and kidneys exhibit no acute abnormalities. There are calcifications within the liver and spleen consistent with previous granulomatous infection.  IMPRESSION: 1. There is no pneumothorax or pneumomediastinum. 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 There is atelectasis or pneumonia of portions of the left lower lobe posteriorly and there is a small left pleural effusion. 2. The  right lung is clear. The mediastinal structures are unremarkable. 3. The observed portions of the upper abdomen exhibit no acute posttraumatic abnormality.   Electronically Signed   By: David  Martinique   On: 02/12/2014 07:11     EKG Interpretation None      MDM   Final diagnoses:  Fracture of multiple ribs of left side, closed, initial encounter  Community acquired pneumonia  Left flank pain   left pleural effusion Clinically patient presents with musculoskeletal pain since recent rib fracture diagnoses. Plan for chest x-ray to look for signs of pneumonia, breathing treatment. Patient 89% on arrival will reassess afterwards.  X-ray showed multiple rib fractures with significant pain CT scan ordered for further detail showing 3 rib fractures and likely small pneumonia. Small pleural effusion on the left. Oral antibiotics given in ER. Discussed the case with patient and primary  Dr. is patient would like to try outpatient followup. Patient's oxygen on recheck 90-91% without current nasal cannula. Pain control on recheck.  The patients results and plan were reviewed and discussed.   Any x-rays performed were personally reviewed by myself.   Differential diagnosis were considered with the presenting HPI.  Medications  sodium chloride 0.9 % bolus 500 mL (0 mLs Intravenous Stopped 02/12/14 0629)  HYDROmorphone (DILAUDID) injection 1 mg (1 mg Intravenous Given 02/12/14 0506)  ketorolac (TORADOL) 15 MG/ML injection 15 mg (15 mg Intravenous Given 02/12/14 0503)  albuterol (PROVENTIL) (2.5 MG/3ML) 0.083% nebulizer solution 5 mg (5 mg Nebulization Given 02/12/14 0528)  levofloxacin (LEVAQUIN) tablet 750 mg (750 mg Oral Given 02/12/14 0734)     Filed Vitals:   02/12/14 0545 02/12/14 0600 02/12/14 0615 02/12/14 0630  BP: 154/91 153/68 148/73 159/70  Pulse: 84 88 92 89  Temp:      TempSrc:      Resp: 16 19 23 22   Height:      Weight:      SpO2: 94% 96% 95% 94%    Admission/ observation were  discussed with the admitting physician, patient and/or family and they are comfortable with the plan.       Mariea Clonts, MD 02/12/14 [phone]

## 2014-02-12 NOTE — ED Notes (Signed)
Per EMS: Pt broke 3 left ribs last week and started having trouble breathing this morning and called 911.  Pt diagnosed with pneumonia on left side and given antibiotics and pain medication. Pt used inhaler on onset of SOB with no relief.  Pt states he cannot take a good deep breath. Fire started 5mg  albuterol and EMS atrovent (1). Pt on second tx of 5mg  atrovent.  Pt on aerosol mask 10L. Pt sob upon exertion.  Hx of anxiety.  Pt experiences muscle spasms and has had 125 mg solumedrol.  Pt hasnt taken any pain medication. Pt is diaphoretic.

## 2014-02-12 NOTE — ED Notes (Signed)
EMS - Pt coming from home with c/o of increased SOB and pain from a fracture in the back on the left side.  Pt is having increased muscle spasms that are intermittent with movement.  Pt states the fracture occurred last Wednesday and went to work yesterday (Monday).  Pt states he took 1 Percocet and 1 Robaxin prior to EMS arrival.  Pt was able to ambulate from the stretcher to the bed.
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
## 2014-02-12 NOTE — ED Notes (Signed)
Dr Zavitz at bedside  
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
## 2014-02-13 ENCOUNTER — Inpatient Hospital Stay (HOSPITAL_COMMUNITY): Payer: 59

## 2014-02-13 LAB — COMPREHENSIVE METABOLIC PANEL
ALK PHOS: 87 U/L (ref 39–117)
ALT: 12 U/L (ref 0–53)
ANION GAP: 16 — AB (ref 5–15)
AST: 13 U/L (ref 0–37)
Albumin: 3 g/dL — ABNORMAL LOW (ref 3.5–5.2)
BILIRUBIN TOTAL: 0.4 mg/dL (ref 0.3–1.2)
BUN: 17 mg/dL (ref 6–23)
CHLORIDE: 103 meq/L (ref 96–112)
CO2: 20 meq/L (ref 19–32)
Calcium: 8.3 mg/dL — ABNORMAL LOW (ref 8.4–10.5)
Creatinine, Ser: 0.71 mg/dL (ref 0.50–1.35)
GFR calc non Af Amer: >90 mL/min (ref >90)
GLUCOSE: 177 mg/dL — AB (ref 70–99)
Potassium: 4.3 mEq/L (ref 3.7–5.3)
SODIUM: 139 meq/L (ref 137–147)
TOTAL PROTEIN: 5.7 g/dL — AB (ref 6.0–8.3)

## 2014-02-13 LAB — CBC
HEMATOCRIT: 34.9 % — AB (ref 39.0–52.0)
Hemoglobin: 12.2 g/dL — ABNORMAL LOW (ref 13.0–17.0)
MCH: 31.8 pg (ref 26.0–34.0)
MCHC: 35 g/dL (ref 30.0–36.0)
MCV: 90.9 fL (ref 78.0–100.0)
Platelets: 270 10*3/uL (ref 150–400)
RBC: 3.84 MIL/uL — AB (ref 4.22–5.81)
RDW: 12.7 % (ref 11.5–15.5)
WBC: 10.4 10*3/uL (ref 4.0–10.5)

## 2014-02-13 LAB — GLUCOSE, CAPILLARY
GLUCOSE-CAPILLARY: 224 mg/dL — AB (ref 70–99)
GLUCOSE-CAPILLARY: 182 mg/dL — AB (ref 70–99)
GLUCOSE-CAPILLARY: 173 mg/dL — AB (ref 70–99)
Glucose-Capillary: 108 mg/dL — ABNORMAL HIGH (ref 70–99)
Glucose-Capillary: 158 mg/dL — ABNORMAL HIGH (ref 70–99)
Glucose-Capillary: 174 mg/dL — ABNORMAL HIGH (ref 70–99)
Glucose-Capillary: 96 mg/dL (ref 70–99)

## 2014-02-13 MED ORDER — ALBUTEROL SULFATE (2.5 MG/3ML) 0.083% IN NEBU
2.5000 mg | INHALATION_SOLUTION | Freq: Four times a day (QID) | RESPIRATORY_TRACT | Status: DC
  Administered 2014-02-13 – 2014-02-14 (×5): 2.5 mg via RESPIRATORY_TRACT
  Filled 2014-02-13 (×5): qty 3

## 2014-02-13 MED ORDER — METHOCARBAMOL 750 MG PO TABS
750.0000 mg | ORAL_TABLET | Freq: Four times a day (QID) | ORAL | Status: DC | PRN
  Administered 2014-02-13 – 2014-02-16 (×7): 750 mg via ORAL
  Filled 2014-02-13 (×10): qty 1

## 2014-02-13 NOTE — Progress Notes (Signed)
Subjective: Had a reasonable night. Pain is fairly well controlled. Still hurts to breath. Min cough. Not too SOB now with O2 in place   Objective: Vital signs in last 24 hours: Temp:  [97.7 F (36.5 C)-99.3 F (37.4 C)] 98.2 F (36.8 C) (07/29 0423) Pulse Rate:  [80-117] 92 (07/29 0600) Resp:  [17-34] 19 (07/29 0600) BP: (118-153)/(56-81) 140/68 mmHg (07/29 0600) SpO2:  [77 %-100 %] 99 % (07/29 0716) Weight:  [75.8 kg (167 lb 1.7 oz)-77.111 kg (170 lb)] 75.8 kg (167 lb 1.7 oz) (07/29 0423)  Intake/Output from previous [place]: 07/28 0701 - 07/29 0700 In: 732.5 [P.O.:120; I.V.:612.5] Out: 1500 [Urine:1500] Intake/Output this shift:    Tanned sitting up in no distress.O2 in place. Face symmetric. Coarse left rhonchi. Ht regular abd soft NT, extrems no edema. Alert, mentating well  Lab Results   Recent Labs  02/12/14 1517 02/13/14 0336  WBC 9.9 10.4  RBC 4.33 3.84*  HGB 14.3 12.2*  HCT 39.1 34.9*  MCV 90.3 90.9  MCH 33.0 31.8  RDW 12.4 12.7  PLT 252 270    Recent Labs  02/12/14 1517 02/13/14 0336  NA 140 139  K 4.0 4.3  CL 103 103  CO2 19 20  GLUCOSE 216* 177*  BUN 19 17  CREATININE 0.65 0.71  CALCIUM 8.5 8.3*    Studies/Results: Dg Chest 2 View  02/12/2014   CLINICAL DATA:  Rib fracture, worsening pain.  EXAM: CHEST  2 VIEW  COMPARISON:  Chest radiograph June 08, 2013  FINDINGS: Acute appearing minimally displaced left lateral fifth and seventh (possibly 6th ) rib fractures. Additional chronic left anterior fifth rib fracture, right posterior rib fractures. No pneumothorax. Patchy left lower lobe airspace opacity with trace left pleural effusion. Cardiac silhouette is shifted to left. Mediastinal silhouette is nonsuspicious, mildly calcified aortic knob.  Status post knee ACDF.  IMPRESSION: Acute left lateral fifth, seventh and possibly sixth rib fractures without radiographic findings of pneumothorax. Trace left pleural effusion versus hemothorax with left lung  base atelectasis, less likely contusion.   Electronically Signed   By: Elon Alas   On: 02/12/2014 05:29   Ct Chest Wo Contrast  02/12/2014   CLINICAL DATA:  Recent left rib fractures now with increasing shortness of breath  EXAM: CT CHEST WITHOUT CONTRAST  TECHNIQUE: Multidetector CT imaging of the chest was performed following the standard protocol without IV contrast.  COMPARISON:  PA and lateral chest x-ray of February 12, 2014  FINDINGS: The right lung is well-expanded and clear. On the left there is mild volume loss which use in part congenital. There is consolidation of posterior left lower lobe parenchyma and there is a small left pleural effusion layering posteriorly. There is no pneumothorax or pneumomediastinum.  The cardiac chambers are normal in size. There are coronary artery calcifications. The thoracic esophagus and thoracic aorta are unremarkable. Fractures of the lateral aspects of the seventh, eighth, and ninth ribs are demonstrated. The thoracic vertebral bodies are preserved in height. There is degenerative disc change with osteophytes at T10-T11. The sternum is normal.  Within the upper abdomen the observed portions of the liver and spleen and kidneys exhibit no acute abnormalities. There are calcifications within the liver and spleen consistent with previous granulomatous infection.  IMPRESSION: 1. There is no pneumothorax or pneumomediastinum. 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 There is atelectasis or pneumonia of portions of the left  lower lobe posteriorly and there is a small left pleural effusion. 2. The right lung is clear. The mediastinal structures are unremarkable. 3. The observed portions of the upper abdomen exhibit no acute posttraumatic abnormality.   Electronically Signed   By: David  Martinique   On: 02/12/2014 07:11   Dg Chest Portable 1 View  02/12/2014   CLINICAL DATA:   Shortness breath.  EXAM: PORTABLE CHEST - 1 VIEW  COMPARISON:  02/12/2014 5:21 a.m. and 06/08/2013 chest x-ray. 02/04/2014 chest CT.  FINDINGS: Left-sided rib fractures better demonstrated on prior exams.  Progressive consolidation left mid to lower lung zone when compared to the chest x-ray earlier today. This may represent atelectasis and/or infiltrate with small amount of pleural fluid. Mild mediastinal shift to the left suggests component of atelectasis.  Pulmonary vascular prominence most notable centrally.  No gross pneumothorax.  IMPRESSION: Progressive consolidation left mid to lower lung zone when compared to the chest x-ray earlier today. This may represent atelectasis and/or infiltrate with small amount of pleural fluid. Mild mediastinal shift to the left suggests component of atelectasis.  Left-sided rib fractures better demonstrated on prior exams.  No gross pneumothorax.  Central pulmonary vascular prominence.   Electronically Signed   By: Chauncey Cruel M.D.   On: 02/12/2014 16:32    Scheduled Meds: . enoxaparin (LOVENOX) injection  40 mg Subcutaneous Q24H  . insulin pump   Subcutaneous TID AC, HS, 0200  . levofloxacin  750 mg Oral Daily  . mometasone-formoterol  2 puff Inhalation BID  . senna  1 tablet Oral BID  . simvastatin  40 mg Oral QPM  . sodium chloride  3 mL Intravenous Q12H  . venlafaxine XR  75 mg Oral Q breakfast   Continuous Infusions: . 0.9 % NaCl with KCl 20 mEq / L 75 mL/hr at 02/12/14 2300  . insulin pump     PRN Meds:acetaminophen, acetaminophen, albuterol, ALPRAZolam, ibuprofen, morphine injection, ondansetron (ZOFRAN) IV, ondansetron, oxyCODONE, zolpidem  Assessment/Plan: PNEUMONIA/HYPOXIA: doing a bit better. Continue Abx  Add some albuterol RIB FRACTURES: non displaced, no pneumo yesterday. Today's XR pending DM 1: doing fair, some steroid effect ANEMIA: mild DEPRESSON: on Rx HYPERLIPIDEMIA: on Rx   LOS: 1 [name]   James [name] 02/13/2014, 7:52  AM

## 2014-02-13 NOTE — Progress Notes (Signed)
RT discussed with PT concerning deep breathing and coughing- PT demonstrates verbally that he understands importance of compliance.

## 2014-02-13 NOTE — Progress Notes (Signed)
Inpatient Diabetes Program Recommendations  AACE/ADA: New Consensus Statement on Inpatient Glycemic Control (2013)  Target Ranges:  Prepandial:   less than 140 mg/dL      Peak postprandial:   less than 180 mg/dL (1-2 hours)      Critically ill patients:  140 - 180 mg/dL   This coordinator met with patient to obtain and record insulin pump settings.  Pt does not currently have extra pump supplies at bedside but his wife will bring them later this evening.  Current pump settings are as follows: Basal: 12a 1.3 07a 1.4 2pm 1.3 Target 120 Insulin/carb ratio: 1:10 ISF (sensitivity factor) 1:30 Pt is due to change his pump site today.   Will follow. Thank you  Raoul Pitch BSN, RN,CDE Inpatient Diabetes Coordinator [phone] (team pager)

## 2014-02-14 LAB — COMPREHENSIVE METABOLIC PANEL
ALK PHOS: 77 U/L (ref 39–117)
ALT: 11 U/L (ref 0–53)
ANION GAP: 9 (ref 5–15)
AST: 11 U/L (ref 0–37)
Albumin: 2.8 g/dL — ABNORMAL LOW (ref 3.5–5.2)
BUN: 15 mg/dL (ref 6–23)
CALCIUM: 8.3 mg/dL — AB (ref 8.4–10.5)
CO2: 26 mEq/L (ref 19–32)
Chloride: 105 mEq/L (ref 96–112)
Creatinine, Ser: 0.76 mg/dL (ref 0.50–1.35)
GLUCOSE: 72 mg/dL (ref 70–99)
Potassium: 4.9 mEq/L (ref 3.7–5.3)
Sodium: 140 mEq/L (ref 137–147)
Total Bilirubin: 0.5 mg/dL (ref 0.3–1.2)
Total Protein: 5.6 g/dL — ABNORMAL LOW (ref 6.0–8.3)

## 2014-02-14 LAB — GLUCOSE, CAPILLARY
GLUCOSE-CAPILLARY: 91 mg/dL (ref 70–99)
GLUCOSE-CAPILLARY: 221 mg/dL — AB (ref 70–99)
Glucose-Capillary: 72 mg/dL (ref 70–99)
Glucose-Capillary: 133 mg/dL — ABNORMAL HIGH (ref 70–99)
Glucose-Capillary: 205 mg/dL — ABNORMAL HIGH (ref 70–99)
Glucose-Capillary: 158 mg/dL — ABNORMAL HIGH (ref 70–99)
Glucose-Capillary: 191 mg/dL — ABNORMAL HIGH (ref 70–99)

## 2014-02-14 LAB — CBC
HCT: 33.3 % — ABNORMAL LOW (ref 39.0–52.0)
HEMOGLOBIN: 11.4 g/dL — AB (ref 13.0–17.0)
MCH: 31.8 pg (ref 26.0–34.0)
MCHC: 34.2 g/dL (ref 30.0–36.0)
MCV: 92.8 fL (ref 78.0–100.0)
Platelets: 244 10*3/uL (ref 150–400)
RBC: 3.59 MIL/uL — AB (ref 4.22–5.81)
RDW: 12.7 % (ref 11.5–15.5)
WBC: 8.2 10*3/uL (ref 4.0–10.5)

## 2014-02-14 MED ORDER — IPRATROPIUM-ALBUTEROL 0.5-2.5 (3) MG/3ML IN SOLN: 3.0000 mL | RESPIRATORY_TRACT | Status: DC | PRN

## 2014-02-14 MED ORDER — CALCITONIN (SALMON) 200 UNIT/ACT NA SOLN
1.0000 | Freq: Every day | NASAL | Status: DC
  Administered 2014-02-14 – 2014-02-16 (×3): 1 via NASAL
  Filled 2014-02-14: qty 3.7

## 2014-02-14 MED ORDER — IPRATROPIUM-ALBUTEROL 0.5-2.5 (3) MG/3ML IN SOLN
3.0000 mL | Freq: Three times a day (TID) | RESPIRATORY_TRACT | Status: DC
  Administered 2014-02-14: 3 mL via RESPIRATORY_TRACT
  Filled 2014-02-14: qty 3

## 2014-02-14 NOTE — Progress Notes (Signed)
From 2C per bed alert oriented,Bil lungs bil rhonchi IS enc splinting side L non prod cough Insulin pump intact cbg 205

## 2014-02-14 NOTE — Progress Notes (Signed)
Utilization review completed.  

## 2014-02-14 NOTE — Progress Notes (Signed)
Patient is transferred to 5N10 per hospital bed.  Patient is alert and oriented. All his belongings including his insulin and eyeglasses was sent with him.  Message left with [name] Bobe to inform her of her husband's transfer to 5N10.

## 2014-02-14 NOTE — Progress Notes (Signed)
Subjective: Doing fair, still some stabbing pains at rib fx site, Some back spasms, eating OK   Objective: Vital signs in last 24 hours: Temp:  [97.7 F (36.5 C)-99 F (37.2 C)] 98 F (36.7 C) (07/30 0737) Pulse Rate:  [65-90] 78 (07/30 0737) Resp:  [16-27] 16 (07/30 0737) BP: (93-140)/(49-82) 140/61 mmHg (07/30 0737) SpO2:  [96 %-100 %] 96 % (07/30 0737)  Intake/Output from previous [place]: 07/29 0701 - 07/30 0700 In: 2138 [P.O.:410; I.V.:1728] Out: 1825 [Urine:1825] Intake/Output this shift: Total I/O In: 240 [P.O.:240] Out: -   Tired appearing. O2 in place. Face symmetric, diffuse rhonchi left and a bit on the right. Ht regular abd sl distended NT, awake, mentating well  Lab Results   Recent Labs  02/13/14 0336 02/14/14 0421  WBC 10.4 8.2  RBC 3.84* 3.59*  HGB 12.2* 11.4*  HCT 34.9* 33.3*  MCV 90.9 92.8  MCH 31.8 31.8  RDW 12.7 12.7  PLT 270 244    Recent Labs  02/13/14 0336 02/14/14 0421  NA 139 140  K 4.3 4.9  CL 103 105  CO2 20 26  GLUCOSE 177* 72  BUN 17 15  CREATININE 0.71 0.76  CALCIUM 8.3* 8.3*    Studies/Results: Dg Chest 2 View  02/13/2014   CLINICAL DATA:  Chest pain, rib fractures  EXAM: CHEST  2 VIEW  COMPARISON:  02/04/2014  FINDINGS: Cardiomediastinal silhouette is stable. Slight improved aeration. Left rib fracture poorly visualized. Persistent left basilar atelectasis, infiltrate or lung contusion. No pneumothorax.  IMPRESSION: Slight improved aeration. Left rib fracture poorly visualized. Persistent left basilar atelectasis, infiltrate or lung contusion. No pneumothorax.   Electronically Signed   By: Lahoma Crocker M.D.   On: 02/13/2014 08:16   Dg Chest Portable 1 View  02/12/2014   CLINICAL DATA:  Shortness breath.  EXAM: PORTABLE CHEST - 1 VIEW  COMPARISON:  02/12/2014 5:21 a.m. and 06/08/2013 chest x-ray. 02/04/2014 chest CT.  FINDINGS: Left-sided rib fractures better demonstrated on prior exams.  Progressive consolidation left mid to lower  lung zone when compared to the chest x-ray earlier today. This may represent atelectasis and/or infiltrate with small amount of pleural fluid. Mild mediastinal shift to the left suggests component of atelectasis.  Pulmonary vascular prominence most notable centrally.  No gross pneumothorax.  IMPRESSION: Progressive consolidation left mid to lower lung zone when compared to the chest x-ray earlier today. This may represent atelectasis and/or infiltrate with small amount of pleural fluid. Mild mediastinal shift to the left suggests component of atelectasis.  Left-sided rib fractures better demonstrated on prior exams.  No gross pneumothorax.  Central pulmonary vascular prominence.   Electronically Signed   By: Chauncey Cruel M.D.   On: 02/12/2014 16:32    Scheduled Meds: . albuterol  2.5 mg Nebulization QID  . calcitonin (salmon)  1 spray Alternating Nares Daily  . enoxaparin (LOVENOX) injection  40 mg Subcutaneous Q24H  . insulin pump   Subcutaneous TID AC, HS, 0200  . levofloxacin  750 mg Oral Daily  . mometasone-formoterol  2 puff Inhalation BID  . senna  1 tablet Oral BID  . simvastatin  40 mg Oral QPM  . sodium chloride  3 mL Intravenous Q12H  . venlafaxine XR  75 mg Oral Q breakfast   Continuous Infusions: . 0.9 % NaCl with KCl 20 mEq / L 75 mL/hr at 02/13/14 2352  . insulin pump     PRN Meds:acetaminophen, acetaminophen, albuterol, ALPRAZolam, ibuprofen, methocarbamol, morphine injection, ondansetron (ZOFRAN)  IV, ondansetron, oxyCODONE, zolpidem  Assessment/Plan:  PNEUMONIA/HYPOXIA: still fairly compromised. No worsening on XR Will move to telemetry RIB FRACTURES: add some miacalcin for pain  DM 1: down to 72 ANEMIA: mild  DEPRESSON: on Rx , doing fair HYPERLIPIDEMIA: on Rx     LOS: 2 days   Azana Kiesler ALAN 02/14/2014, 7:44 AM

## 2014-02-15 ENCOUNTER — Inpatient Hospital Stay (HOSPITAL_COMMUNITY): Payer: 59

## 2014-02-15 LAB — GLUCOSE, CAPILLARY
GLUCOSE-CAPILLARY: 61 mg/dL — AB (ref 70–99)
GLUCOSE-CAPILLARY: 88 mg/dL (ref 70–99)
Glucose-Capillary: 79 mg/dL (ref 70–99)
Glucose-Capillary: 148 mg/dL — ABNORMAL HIGH (ref 70–99)
Glucose-Capillary: 140 mg/dL — ABNORMAL HIGH (ref 70–99)

## 2014-02-15 NOTE — Progress Notes (Signed)
Hypoglycemic Event  CBG: 61  Treatment: 15 GM carbohydrate snack  Symptoms: None  Follow-up CBG: Time: 1703 CBG Result:81  Possible Reasons for Event: Unknown  Comments/MD notified: 4 oz of juice given in addition to dinner tray; will continue monitor patient    James [name]  Remember to initiate Hypoglycemia Order Set & complete

## 2014-02-15 NOTE — Progress Notes (Signed)
Came to visit patient at bedside. He is currently active with [facility] program for DM management for [employer] [facility] employees/dependents with [employer]. Discussed how [facility] [employer] coordinator had been trying to reach for an appointment. However, he was in the hospital. Provided contact information. [facility] will follow post discharge. Patient appreciative of visit. Made inpatient RNCM aware that patient active with [employer] [facility] program.  Marthenia Rolling, MSN- RN,BSN- [facility] Liaison- [phone]

## 2014-02-15 NOTE — Progress Notes (Signed)
Pt past due for infusion set site change but refused at this time. Infusion set currently in RLQ and without signs of infection or irritation.  Continuous glucose senser in LUQ but does not reqire site change at this time. Pt states he has proper equipment with him at bedside and will do infusion set site change first thing Friday AM. Educated pt on need to change/rotate site every 3 days. Patient stated he understood but wanted to wait. Will pass on to Celia shift RN and will continue to monitor current site for signs of irritation or infection.

## 2014-02-15 NOTE — Progress Notes (Signed)
Subjective: Doing a bit better. Starting to mobilize. Still some pain  Bowels OK   Objective: Vital signs in last 24 hours: Temp:  [97.5 F (36.4 C)-98.5 F (36.9 C)] 97.5 F (36.4 C) (07/31 0504) Pulse Rate:  [83-94] 94 (07/31 0504) Resp:  [16-21] 16 (07/31 0504) BP: (116-145)/(54-68) 139/64 mmHg (07/31 0504) SpO2:  [94 %-98 %] 94 % (07/31 0504)  Intake/Output from previous [place]: 07/30 0701 - 07/31 0700 In: 2283 [P.O.:480; I.V.:1803] Out: 1525 [Urine:1525] Intake/Output this shift: Total I/O In: 240 [P.O.:240] Out: -   Looks a bit better, anicteric, less rhonchi,ht regular abd soft NT, no edema, mentating OK  Lab Results   Recent Labs  02/13/14 0336 02/14/14 0421  WBC 10.4 8.2  RBC 3.84* 3.59*  HGB 12.2* 11.4*  HCT 34.9* 33.3*  MCV 90.9 92.8  MCH 31.8 31.8  RDW 12.7 12.7  PLT 270 244    Recent Labs  02/13/14 0336 02/14/14 0421  NA 139 140  K 4.3 4.9  CL 103 105  CO2 20 26  GLUCOSE 177* 72  BUN 17 15  CREATININE 0.71 0.76  CALCIUM 8.3* 8.3*    Studies/Results: No results found.  Scheduled Meds: . calcitonin (salmon)  1 spray Alternating Nares Daily  . enoxaparin (LOVENOX) injection  40 mg Subcutaneous Q24H  . insulin pump   Subcutaneous TID AC, HS, 0200  . levofloxacin  750 mg Oral Daily  . mometasone-formoterol  2 puff Inhalation BID  . senna  1 tablet Oral BID  . simvastatin  40 mg Oral QPM  . sodium chloride  3 mL Intravenous Q12H  . venlafaxine XR  75 mg Oral Q breakfast   Continuous Infusions: . 0.9 % NaCl with KCl 20 mEq / L 75 mL/hr at 02/14/14 1441  . insulin pump     PRN Meds:acetaminophen, acetaminophen, ALPRAZolam, ibuprofen, ipratropium-albuterol, methocarbamol, morphine injection, ondansetron (ZOFRAN) IV, ondansetron, oxyCODONE, zolpidem  Assessment/Plan:  PNEUMONIA/HYPOXIA: repeat XR today     RIB FRACTURES: add some miacalcin for pain , doing fair,  DM 1: down to 7 ANEMIA: mild  DEPRESSON: on Rx , doing fair   HYPERLIPIDEMIA: on Rx  DISP: Mobilize today, hope to get home tomorrow    LOS: 3 days   Andrea Colglazier ALAN 02/15/2014, 9:44 AM

## 2014-02-16 LAB — BASIC METABOLIC PANEL
ANION GAP: 14 (ref 5–15)
BUN: 15 mg/dL (ref 6–23)
CO2: 24 meq/L (ref 19–32)
Calcium: 8.7 mg/dL (ref 8.4–10.5)
Chloride: 95 mEq/L — ABNORMAL LOW (ref 96–112)
Creatinine, Ser: 0.67 mg/dL (ref 0.50–1.35)
GFR calc Af Amer: >90 mL/min (ref >90)
GFR calc non Af Amer: >90 mL/min (ref >90)
Glucose, Bld: 224 mg/dL — ABNORMAL HIGH (ref 70–99)
POTASSIUM: 5.6 meq/L — AB (ref 3.7–5.3)
Sodium: 133 mEq/L — ABNORMAL LOW (ref 137–147)

## 2014-02-16 LAB — CBC
HCT: 34.9 % — ABNORMAL LOW (ref 39.0–52.0)
Hemoglobin: 12.5 g/dL — ABNORMAL LOW (ref 13.0–17.0)
MCH: 32.1 pg (ref 26.0–34.0)
MCHC: 35.8 g/dL (ref 30.0–36.0)
MCV: 89.7 fL (ref 78.0–100.0)
PLATELETS: 259 10*3/uL (ref 150–400)
RBC: 3.89 MIL/uL — AB (ref 4.22–5.81)
RDW: 12.1 % (ref 11.5–15.5)
WBC: 6.2 10*3/uL (ref 4.0–10.5)

## 2014-02-16 LAB — GLUCOSE, CAPILLARY
Glucose-Capillary: 120 mg/dL — ABNORMAL HIGH (ref 70–99)
Glucose-Capillary: 243 mg/dL — ABNORMAL HIGH (ref 70–99)
Glucose-Capillary: 256 mg/dL — ABNORMAL HIGH (ref 70–99)

## 2014-02-16 MED ORDER — ALBUTEROL SULFATE HFA 108 (90 BASE) MCG/ACT IN AERS: 2.0000 | INHALATION_SPRAY | Freq: Four times a day (QID) | RESPIRATORY_TRACT | Status: DC | PRN

## 2014-02-16 MED ORDER — MOMETASONE FURO-FORMOTEROL FUM 100-5 MCG/ACT IN AERO: 2.0000 | INHALATION_SPRAY | Freq: Two times a day (BID) | RESPIRATORY_TRACT | Status: DC

## 2014-02-16 MED ORDER — LEVOFLOXACIN 750 MG PO TABS: ORAL_TABLET | ORAL | Status: DC

## 2014-02-16 MED ORDER — METHOCARBAMOL 750 MG PO TABS: 750.0000 mg | ORAL_TABLET | Freq: Four times a day (QID) | ORAL | Status: DC | PRN

## 2014-02-16 MED ORDER — HYDROCODONE-HOMATROPINE 5-1.5 MG/5ML PO SYRP: 5.0000 mL | ORAL_SOLUTION | Freq: Four times a day (QID) | ORAL | Status: DC | PRN

## 2014-02-16 MED ORDER — OXYCODONE HCL 5 MG PO TABS: 5.0000 mg | ORAL_TABLET | ORAL | Status: DC | PRN

## 2014-02-16 NOTE — Discharge Summary (Signed)
Physician Discharge Summary    James [name]  MR#: [id number]  DOB:Jan 15, 1956  Date of Admission: 02/12/2014 Date of Discharge: 02/16/2014  Attending Physician:James [name]  Patient's FXT:[id number] James Haste, MD    Discharge Diagnoses: Active Problems:   Pneumonia   Discharge Medications:   Medication List         acetaminophen 500 MG tablet  Commonly known as:  TYLENOL  Take 1,000 mg by mouth every 6 (six) hours as needed for pain.     albuterol 108 (90 BASE) MCG/ACT inhaler  Commonly known as:  PROVENTIL HFA;VENTOLIN HFA  Inhale 2 puffs into the lungs every 6 (six) hours as needed for wheezing or shortness of breath.     EMERGEN-C IMMUNE Pack  Take 1 Package by mouth daily as needed.     HYDROcodone-homatropine 5-1.5 MG/5ML syrup  Commonly known as:  HYDROMET  Take 5 mLs by mouth every 6 (six) hours as needed for cough.     ibuprofen 200 MG tablet  Commonly known as:  ADVIL,MOTRIN  Take 400 mg by mouth every 6 (six) hours as needed for moderate pain.     insulin pump Soln  Inject into the skin continuous. Humalog 100 units/ml     LATUDA 20 MG Tabs  Generic drug:  Lurasidone HCl  Take 1 tablet by mouth daily.     levofloxacin 750 MG tablet  Commonly known as:  LEVAQUIN  Take 1 tablet in the morning on 8/2 and 8/3 , then stop     methocarbamol 750 MG tablet  Commonly known as:  ROBAXIN  Take 1 tablet (750 mg total) by mouth every 6 (six) hours as needed for muscle spasms.     mometasone-formoterol 100-5 MCG/ACT Aero  Commonly known as:  DULERA  Inhale 2 puffs into the lungs 2 (two) times daily.     oxyCODONE 5 MG immediate release tablet  Commonly known as:  Oxy IR/ROXICODONE  Take 1 tablet (5 mg total) by mouth every 4 (four) hours as needed for moderate pain.     simvastatin 40 MG tablet  Commonly known as:  ZOCOR  Take 40 mg by mouth every evening.     venlafaxine XR 75 MG 24 hr capsule  Commonly known as:  EFFEXOR-XR  Take 75 mg by mouth daily  with breakfast.        Hospital Procedures: Dg Chest 2 View  02/15/2014   CLINICAL DATA:  Rib fracture  EXAM: CHEST  2 VIEW  COMPARISON:  02/13/2014  FINDINGS: Cardiomediastinal silhouette is stable. Persistent left basilar atelectasis or infiltrate. Displaced fracture of the left fifth and seventh ribs. No pneumothorax. Right lung is clear. Mild displaced fracture of the left eighth rib.  IMPRESSION: No pneumothorax. Persistent left basilar atelectasis or infiltrate. Displaced fracture of the left fifth and seventh ribs. Minimal displaced fracture of the left eighth rib.   Electronically Signed   By: Lahoma Crocker M.D.   On: 02/15/2014 11:13   Dg Chest 2 View  02/13/2014   CLINICAL DATA:  Chest pain, rib fractures  EXAM: CHEST  2 VIEW  COMPARISON:  02/04/2014  FINDINGS: Cardiomediastinal silhouette is stable. Slight improved aeration. Left rib fracture poorly visualized. Persistent left basilar atelectasis, infiltrate or lung contusion. No pneumothorax.  IMPRESSION: Slight improved aeration. Left rib fracture poorly visualized. Persistent left basilar atelectasis, infiltrate or lung contusion. No pneumothorax.   Electronically Signed   By: Lahoma Crocker M.D.   On: 02/13/2014 08:16   Dg Chest 2  View  02/12/2014   CLINICAL DATA:  Rib fracture, worsening pain.  EXAM: CHEST  2 VIEW  COMPARISON:  Chest radiograph June 08, 2013  FINDINGS: Acute appearing minimally displaced left lateral fifth and seventh (possibly 6th ) rib fractures. Additional chronic left anterior fifth rib fracture, right posterior rib fractures. No pneumothorax. Patchy left lower lobe airspace opacity with trace left pleural effusion. Cardiac silhouette is shifted to left. Mediastinal silhouette is nonsuspicious, mildly calcified aortic knob.  Status post knee ACDF.  IMPRESSION: Acute left lateral fifth, seventh and possibly sixth rib fractures without radiographic findings of pneumothorax. Trace left pleural effusion versus hemothorax  with left lung base atelectasis, less likely contusion.   Electronically Signed   By: Elon Alas   On: 02/12/2014 05:29   Ct Chest Wo Contrast  02/12/2014   CLINICAL DATA:  Recent left rib fractures now with increasing shortness of breath  EXAM: CT CHEST WITHOUT CONTRAST  TECHNIQUE: Multidetector CT imaging of the chest was performed following the standard protocol without IV contrast.  COMPARISON:  PA and lateral chest x-ray of February 12, 2014  FINDINGS: The right lung is well-expanded and clear. On the left there is mild volume loss which use in part congenital. There is consolidation of posterior left lower lobe parenchyma and there is a small left pleural effusion layering posteriorly. There is no pneumothorax or pneumomediastinum.  The cardiac chambers are normal in size. There are coronary artery calcifications. The thoracic esophagus and thoracic aorta are unremarkable. Fractures of the lateral aspects of the seventh, eighth, and ninth ribs are demonstrated. The thoracic vertebral bodies are preserved in height. There is degenerative disc change with osteophytes at T10-T11. The sternum is normal.  Within the upper abdomen the observed portions of the liver and spleen and kidneys exhibit no acute abnormalities. There are calcifications within the liver and spleen consistent with previous granulomatous infection.  IMPRESSION: 1. There is no pneumothorax or pneumomediastinum. 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 There is atelectasis or pneumonia of portions of the left lower lobe posteriorly and there is a small left pleural effusion. 2. The right lung is clear. The mediastinal structures are unremarkable. 3. The observed portions of the upper abdomen exhibit no acute posttraumatic abnormality.   Electronically Signed   By: David  Martinique   On: 02/12/2014 07:11   Dg Chest Portable 1 View  02/12/2014    CLINICAL DATA:  Shortness breath.  EXAM: PORTABLE CHEST - 1 VIEW  COMPARISON:  02/12/2014 5:21 a.m. and 06/08/2013 chest x-ray. 02/04/2014 chest CT.  FINDINGS: Left-sided rib fractures better demonstrated on prior exams.  Progressive consolidation left mid to lower lung zone when compared to the chest x-ray earlier today. This may represent atelectasis and/or infiltrate with small amount of pleural fluid. Mild mediastinal shift to the left suggests component of atelectasis.  Pulmonary vascular prominence most notable centrally.  No gross pneumothorax.  IMPRESSION: Progressive consolidation left mid to lower lung zone when compared to the chest x-ray earlier today. This may represent atelectasis and/or infiltrate with small amount of pleural fluid. Mild mediastinal shift to the left suggests component of atelectasis.  Left-sided rib fractures better demonstrated on prior exams.  No gross pneumothorax.  Central pulmonary vascular prominence.   Electronically Signed   By: Chauncey Cruel M.D.   On: 02/12/2014 16:32    History of Present Illness: Admitted for rib fracture and PNA  Hospital Course: PNA - completed 5 days of levaquin in house . Will be discharged w/ 2 days to complete 7 days abx. Still having mild cough but no increased O2 requirement . Afebrile. No leukocytosis . Ready for dc home   COPD - refilling his dulera and alb inh to use prn . Did have mild wheezing today on exam, but no increased O2 requirement and no distress   Mild hyperkalemia - incidentally noted on [facility] of discharge. K 5.6. No need for med changes. Repeat BMet in office next week   Rib fracture - # 30 of oxycodone and robaxin provided at discharge for pain control . Cough suppressant provided as well to try and prevent furthering of any pain .   He states he is ready to discharge home and feels he is able to care for himself at home. Having BMs and voiding w/o issue. Has help at home today and tomorrow. Will be seen in Dr  Baldwin Crown office this following week in follow up .   [facility] of Discharge Exam BP 126/62  Pulse 82  Temp(Src) 98.1 F (36.7 C) (Oral)  Resp 20  Ht 5\' 9"  (1.753 m)  Wt 167 lb 1.7 oz (75.8 kg)  BMI 24.67 kg/m2  SpO2 95%  Physical Exam: General appearance: WM in NAD  Eyes: no scleral icterus Throat: oropharynx moist without erythema Resp: exp wheezes present throughout  Cardio: RRR, no mRG  GI: soft, non-tender; bowel sounds normal; no masses,  no organomegaly Extremities: no clubbing, cyanosis or edema  Discharge Labs:  Recent Labs  02/14/14 0421 02/16/14 0416  NA 140 133*  K 4.9 5.6*  CL 105 95*  CO2 26 24  GLUCOSE 72 224*  BUN 15 15  CREATININE 0.76 0.67  CALCIUM 8.3* 8.7    Recent Labs  02/14/14 0421  AST 11  ALT 11  ALKPHOS 77  BILITOT 0.5  PROT 5.6*  ALBUMIN 2.8*    Recent Labs  02/14/14 0421 02/16/14 0416  WBC 8.2 6.2  HGB 11.4* 12.5*  HCT 33.3* 34.9*  MCV 92.8 89.7  PLT 244 259   No results found for this basename: INR, PROTIME   No results found for this basename: CKTOTAL, CKMB, CKMBINDEX, TROPONINI,  in the last 72 hours No results found for this basename: TSH, T4TOTAL, FREET3, T3FREE, THYROIDAB,  in the last 72 hours No results found for this basename: VITAMINB12, FOLATE, FERRITIN, TIBC, IRON, RETICCTPCT,  in the last 72 hours  Discharge instructions:     Discharge Instructions   Call MD for:  severe uncontrolled pain    Complete by:  As directed      Call MD for:  temperature >100.4    Complete by:  As directed      Diet - low sodium heart healthy    Complete by:  As directed      Increase activity slowly    Complete by:  As directed           01-Home or Self Care   Disposition: home today  Follow-up Appts: Follow-up with [name] at [facility] in 1 week.   Call for appointment.  Condition on Discharge: stable   Tests Needing Follow-up: BMet   Time spent in discharge (includes decision making &  examination of pt): 45 minutes    Signed: Wanetta Funderburke 02/16/2014, 7:56 AM

## 2014-09-18 ENCOUNTER — Ambulatory Visit (INDEPENDENT_AMBULATORY_CARE_PROVIDER_SITE_OTHER): Payer: Commercial Managed Care - PPO | Admitting: Ophthalmology

## 2014-09-18 DIAGNOSIS — E10319 Type 1 diabetes mellitus with unspecified diabetic retinopathy without macular edema: Secondary | ICD-10-CM

## 2014-09-18 DIAGNOSIS — E10359 Type 1 diabetes mellitus with proliferative diabetic retinopathy without macular edema: Secondary | ICD-10-CM | POA: Diagnosis not present

## 2014-09-18 DIAGNOSIS — H26491 Other secondary cataract, right eye: Secondary | ICD-10-CM

## 2014-09-21 ENCOUNTER — Emergency Department (HOSPITAL_COMMUNITY)
Admission: EM | Admit: 2014-09-21 | Discharge: 2014-09-21 | Disposition: A | Discharge status: Home / Self Care | Payer: 59 | Source: Home / Self Care | Attending: Family Medicine | Admitting: Family Medicine

## 2014-09-21 ENCOUNTER — Encounter (HOSPITAL_COMMUNITY): Payer: Self-pay

## 2014-09-21 ENCOUNTER — Emergency Department (INDEPENDENT_AMBULATORY_CARE_PROVIDER_SITE_OTHER): Payer: 59

## 2014-09-21 DIAGNOSIS — J441 Chronic obstructive pulmonary disease with (acute) exacerbation: Secondary | ICD-10-CM

## 2014-09-21 DIAGNOSIS — J9801 Acute bronchospasm: Secondary | ICD-10-CM

## 2014-09-21 MED ORDER — ALBUTEROL SULFATE (2.5 MG/3ML) 0.083% IN NEBU
2.5000 mg | INHALATION_SOLUTION | Freq: Once | RESPIRATORY_TRACT | Status: AC
  Administered 2014-09-21: 2.5 mg via RESPIRATORY_TRACT

## 2014-09-21 MED ORDER — IPRATROPIUM-ALBUTEROL 0.5-2.5 (3) MG/3ML IN SOLN
3.0000 mL | Freq: Once | RESPIRATORY_TRACT | Status: AC
  Administered 2014-09-21: 3 mL via RESPIRATORY_TRACT

## 2014-09-21 MED ORDER — ALBUTEROL SULFATE (2.5 MG/3ML) 0.083% IN NEBU
5.0000 mg | INHALATION_SOLUTION | Freq: Once | RESPIRATORY_TRACT | Status: AC
  Administered 2014-09-21: 5 mg via RESPIRATORY_TRACT

## 2014-09-21 MED ORDER — IPRATROPIUM-ALBUTEROL 0.5-2.5 (3) MG/3ML IN SOLN
RESPIRATORY_TRACT | Status: AC
  Filled 2014-09-21: qty 3

## 2014-09-21 MED ORDER — ALBUTEROL SULFATE (2.5 MG/3ML) 0.083% IN NEBU
INHALATION_SOLUTION | RESPIRATORY_TRACT | Status: AC
  Filled 2014-09-21: qty 3

## 2014-09-21 MED ORDER — ALBUTEROL SULFATE (2.5 MG/3ML) 0.083% IN NEBU
INHALATION_SOLUTION | RESPIRATORY_TRACT | Status: AC
  Filled 2014-09-21: qty 6

## 2014-09-21 NOTE — ED Notes (Signed)
Concerned about SOB , hard to breathe. Was reportedly Rx tamiflu by his PCP a couple of days ago, not better/worse . NAD at present

## 2014-09-21 NOTE — Discharge Instructions (Signed)
Chronic Obstructive Pulmonary Disease Exacerbation Chronic obstructive pulmonary disease (COPD) is a common lung condition in which airflow from the lungs is limited. COPD is a general term that can be used to describe many different lung problems that limit airflow, including chronic bronchitis and emphysema. COPD exacerbations are episodes when breathing symptoms become much worse and require extra treatment. Without treatment, COPD exacerbations can be life threatening, and frequent COPD exacerbations can cause further damage to your lungs. CAUSES   Respiratory infections.   Exposure to smoke.   Exposure to air pollution, chemical fumes, or dust. Sometimes there is no apparent cause or trigger. RISK FACTORS  Smoking cigarettes.  Older age.  Frequent prior COPD exacerbations. SIGNS AND SYMPTOMS   Increased coughing.   Increased thick spit (sputum) production.   Increased wheezing.   Increased shortness of breath.   Rapid breathing.   Chest tightness. DIAGNOSIS  Your medical history, a physical exam, and tests will help your health care provider make a diagnosis. Tests may include:  A chest X-ray.  Basic lab tests.  Sputum testing.  An arterial blood gas test. TREATMENT  Depending on the severity of your COPD exacerbation, you may need to be admitted to a hospital for treatment. Some of the treatments commonly used to treat COPD exacerbations are:   Antibiotic medicines.   Bronchodilators. These are drugs that expand the air passages. They may be given with an inhaler or nebulizer. Spacer devices may be needed to help improve drug delivery.  Corticosteroid medicines.  Supplemental oxygen therapy.  HOME CARE INSTRUCTIONS  1. Do not smoke. Quitting smoking is very important to prevent COPD from getting worse and exacerbations from happening as often. 2. Avoid exposure to all substances that irritate the airway, especially to tobacco smoke.  3. If you were  prescribed an antibiotic medicine, finish it all even if you start to feel better. 4. Take all medicines as directed by your health care provider.It is important to use correct technique with inhaled medicines. 5. Drink enough fluids to keep your urine clear or pale yellow (unless you have a medical condition that requires fluid restriction). 6. Use a cool mist vaporizer. This makes it easier to clear your chest when you cough.  7. If you have a home nebulizer and oxygen, continue to use them as directed.  8. Maintain all necessary vaccinations to prevent infections.  9. Exercise regularly.  10. Eat a healthy diet.  11. Keep all follow-up appointments as directed by your health care provider. SEEK IMMEDIATE MEDICAL CARE IF:  You have worsening shortness of breath.   You have trouble talking.   You have severe chest pain.  You have blood in your sputum.  You have a fever.  You have weakness, vomit repeatedly, or faint.   You feel confused.   You continue to get worse. MAKE SURE YOU:   Understand these instructions.  Will watch your condition.  Will get help right away if you are not doing well or get worse. Document Released: 05/02/2007 Document Revised: 11/19/2013 Document Reviewed: 03/09/2013 [facility] Patient Information 2015 [place], [place]. This information is not intended to replace advice given to you by your health care provider. Make sure you discuss any questions you have with your health care provider.  Chronic Obstructive Pulmonary Disease Chronic obstructive pulmonary disease (COPD) is a common lung condition in which airflow from the lungs is limited. COPD is a general term that can be used to describe many different lung problems that limit  airflow, including both chronic bronchitis and emphysema. If you have COPD, your lung function will probably never return to normal, but there are measures you can take to improve lung function and make yourself feel  better.  CAUSES   Smoking (common).   Exposure to secondhand smoke.   Genetic problems.  Chronic inflammatory lung diseases or recurrent infections. SYMPTOMS   Shortness of breath, especially with physical activity.   Deep, persistent (chronic) cough with a large amount of thick mucus.   Wheezing.   Rapid breaths (tachypnea).   Gray or bluish discoloration (cyanosis) of the skin, especially in fingers, toes, or lips.   Fatigue.   Weight loss.   Frequent infections or episodes when breathing symptoms become much worse (exacerbations).   Chest tightness. DIAGNOSIS  Your health care provider will take a medical history and perform a physical examination to make the initial diagnosis. Additional tests for COPD may include:   Lung (pulmonary) function tests.  Chest X-ray.  CT scan.  Blood tests. TREATMENT  Treatment available to help you feel better when you have COPD includes:   Inhaler and nebulizer medicines. These help manage the symptoms of COPD and make your breathing more comfortable.  Supplemental oxygen. Supplemental oxygen is only helpful if you have a low oxygen level in your blood.   Exercise and physical activity. These are beneficial for nearly all people with COPD. Some people may also benefit from a pulmonary rehabilitation program. HOME CARE INSTRUCTIONS   Take all medicines (inhaled or pills) as directed by your health care provider.  Avoid over-the-counter medicines or cough syrups that dry up your airway (such as antihistamines) and slow down the elimination of secretions unless instructed otherwise by your health care provider.   If you are a smoker, the most important thing that you can do is stop smoking. Continuing to smoke will cause further lung damage and breathing trouble. Ask your health care provider for help with quitting smoking. He or she can direct you to community resources or hospitals that provide support.  Avoid  exposure to irritants such as smoke, chemicals, and fumes that aggravate your breathing.  Use oxygen therapy and pulmonary rehabilitation if directed by your health care provider. If you require home oxygen therapy, ask your health care provider whether you should purchase a pulse oximeter to measure your oxygen level at home.   Avoid contact with individuals who have a contagious illness.  Avoid extreme temperature and humidity changes.  Eat healthy foods. Eating smaller, more frequent meals and resting before meals may help you maintain your strength.  Stay active, but balance activity with periods of rest. Exercise and physical activity will help you maintain your ability to do things you want to do.  Preventing infection and hospitalization is very important when you have COPD. Make sure to receive all the vaccines your health care provider recommends, especially the pneumococcal and influenza vaccines. Ask your health care provider whether you need a pneumonia vaccine.  Learn and use relaxation techniques to manage stress.  Learn and use controlled breathing techniques as directed by your health care provider. Controlled breathing techniques include:   Pursed lip breathing. Start by breathing in (inhaling) through your nose for 1 second. Then, purse your lips as if you were going to whistle and breathe out (exhale) through the pursed lips for 2 seconds.   Diaphragmatic breathing. Start by putting one hand on your abdomen just above your waist. Inhale slowly through your nose. The hand on  your abdomen should move out. Then purse your lips and exhale slowly. You should be able to feel the hand on your abdomen moving in as you exhale.   Learn and use controlled coughing to clear mucus from your lungs. Controlled coughing is a series of short, progressive coughs. The steps of controlled coughing are:  12. Lean your head slightly forward.  13. Breathe in deeply using diaphragmatic  breathing.  14. Try to hold your breath for 3 seconds.  15. Keep your mouth slightly open while coughing twice.  16. Spit any mucus out into a tissue.  17. Rest and repeat the steps once or twice as needed. SEEK MEDICAL CARE IF:   You are coughing up more mucus than usual.   There is a change in the color or thickness of your mucus.   Your breathing is more labored than usual.   Your breathing is faster than usual.  SEEK IMMEDIATE MEDICAL CARE IF:   You have shortness of breath while you are resting.   You have shortness of breath that prevents you from:  Being able to talk.   Performing your usual physical activities.   You have chest pain lasting longer than 5 minutes.   Your skin color is more cyanotic than usual.  You measure low oxygen saturations for longer than 5 minutes with a pulse oximeter. MAKE SURE YOU:   Understand these instructions.  Will watch your condition.  Will get help right away if you are not doing well or get worse. Document Released: 04/14/2005 Document Revised: 11/19/2013 Document Reviewed: 03/01/2013 [facility] Patient Information 2015 [place], [place]. This information is not intended to replace advice given to you by your health care provider. Make sure you discuss any questions you have with your health care provider.

## 2014-09-21 NOTE — ED Provider Notes (Signed)
CSN: [id number]     Arrival date & time 09/21/14  1650 History   First MD Initiated Contact with Patient 09/21/14 1809     Chief Complaint  Patient presents with  . Shortness of Breath   (Consider location/radiation/quality/duration/timing/severity/associated sxs/prior Treatment) HPI Comments: 59 year old male smoker is complaining of shortness of breath today. He says it started around noon. It is associated with cough and excited. 4 days ago he was diagnosed with flu and treated with tetanus saw and Tamiflu. Denies fever or chills. He has a history of pneumonia for which she was admitted almost a year ago.  Patient is a 59 y.o. male presenting with shortness of breath.  Shortness of Breath Associated symptoms: cough   Associated symptoms: no chest pain and no fever     Past Medical History  Diagnosis Date  . HYPERLIPIDEMIA 04/04/2007  . ANXIETY 04/03/2007  . DEPRESSION 04/03/2007  . ASTHMATIC BRONCHITIS, ACUTE 10/25/2008  . ASTHMA 09/06/2008  . ED (erectile dysfunction)   . Cervical disc disease   . Bladder neck obstruction   . COPD (chronic obstructive pulmonary disease)   . Spinal stenosis   . Depression   . Anxiety   . CORONARY ARTERY DISEASE 04/03/2007  . CARPAL TUNNEL SYNDROME, BILATERAL 07/31/2007    issues resolved, no surgery  . DIABETES MELLITUS, TYPE I 04/03/2007  . Diabetic retinopathy associated with diabetes mellitus due to underlying condition 04/03/2007  . DM W/EYE MANIFESTATIONS, TYPE I, UNCONTROLLED 04/04/2007  . Pneumonia     "several times and again today" (02/13/2104)  . Chronic bronchitis     "get it about q yr" (02/12/2014)  . Seizures     "insulin seizure from time to time; none in the last couple years" (02/12/2014)  . Renal insufficiency   . DM W/RENAL MNFST, TYPE I, UNCONTROLLED 04/04/2007   Past Surgical History  Procedure Laterality Date  . Anterior cervical decomp/discectomy fusion  2000    "couple screws and a plate"  . Appendectomy    . Stress  cardiolite  09/06/2002  . Tonsillectomy    . Lymph node dissection  ~ 1960    groin  . Cystoscopy with retrograde pyelogram, ureteroscopy and stent placement Bilateral 04/06/2013    Procedure: BILATERAL CYSTOSCOPY WITH RETROGRADE PYELOGRAMS, STENT PLACEMENTS AND LEFT URETEROSCOPY AND STONE REMOVAL;  Surgeon: Alexis Frock, MD;  Location: [facility];  Service: Urology;  Laterality: Bilateral;  . Holmium laser application Left 08/06/4172    Procedure: HOLMIUM LASER APPLICATION;  Surgeon: Alexis Frock, MD;  Location: [facility];  Service: Urology;  Laterality: Left;  . Cystoscopy/retrograde/ureteroscopy Bilateral 04/12/2013    Procedure: CYSTOSCOPY/RETROGRADE/URETEROSCOPY;  Surgeon: Alexis Frock, MD;  Location: [facility];  Service: Urology;  Laterality: Bilateral;  RIGHT RETROGRADE   . Cystoscopy with stent placement Right 04/12/2013    Procedure: CYSTOSCOPY WITH STENT PLACEMENT;  Surgeon: Alexis Frock, MD;  Location: [facility];  Service: Urology;  Laterality: Right;  . Back surgery    . Cardiac catheterization  1990's  . Vitrectomy Bilateral   . Cataract extraction w/ intraocular lens  implant, bilateral Bilateral    Family History  Problem Relation Age of Onset  . Stroke Father     strong FH cerebrovascular disease  . Diabetes Brother   . Heart disease Brother     CHF  . Colon cancer Neg Hx    History  Substance Use Topics  . Smoking status: Current Every [place] Smoker -- 0.25 packs/[place] for 41 years    Types: Cigarettes  .  Smokeless tobacco: Never Used  . Alcohol Use: No    Review of Systems  Constitutional: Positive for activity change. Negative for fever and chills.  Respiratory: Positive for cough and shortness of breath.   Cardiovascular: Negative for chest pain and palpitations.  Gastrointestinal: Negative.   Skin: Negative.   Psychiatric/Behavioral: The patient is nervous/anxious.     Allergies  Iohexol and Lexapro  Home Medications   Prior to Admission medications    Medication Sig Start Date End Date Taking? Authorizing Provider  acetaminophen (TYLENOL) 500 MG tablet Take 1,000 mg by mouth every 6 (six) hours as needed for pain.    Historical Provider, MD  albuterol (PROVENTIL HFA;VENTOLIN HFA) 108 (90 BASE) MCG/ACT inhaler Inhale 2 puffs into the lungs every 6 (six) hours as needed for wheezing or shortness of breath. 02/16/14   Velna Hatchet, MD  HYDROcodone-homatropine (HYDROMET) 5-1.5 MG/5ML syrup Take 5 mLs by mouth every 6 (six) hours as needed for cough. 02/16/14   Velna Hatchet, MD  ibuprofen (ADVIL,MOTRIN) 200 MG tablet Take 400 mg by mouth every 6 (six) hours as needed for moderate pain.     Historical Provider, MD  Insulin Human (INSULIN PUMP) SOLN Inject into the skin continuous. Humalog 100 units/ml    Historical Provider, MD  levofloxacin (LEVAQUIN) 750 MG tablet Take 1 tablet in the morning on 8/2 and 8/3 , then stop 02/16/14   Velna Hatchet, MD  Lurasidone HCl (LATUDA) 20 MG TABS Take 1 tablet by mouth daily.    Historical Provider, MD  methocarbamol (ROBAXIN) 750 MG tablet Take 1 tablet (750 mg total) by mouth every 6 (six) hours as needed for muscle spasms. 02/16/14   Velna Hatchet, MD  mometasone-formoterol (DULERA) 100-5 MCG/ACT AERO Inhale 2 puffs into the lungs 2 (two) times daily. 02/16/14   Velna Hatchet, MD  Multiple Vitamins-Minerals (EMERGEN-C IMMUNE) PACK Take 1 Package by mouth daily as needed.    Historical Provider, MD  oxyCODONE (OXY IR/ROXICODONE) 5 MG immediate release tablet Take 1 tablet (5 mg total) by mouth every 4 (four) hours as needed for moderate pain. 02/16/14   Velna Hatchet, MD  simvastatin (ZOCOR) 40 MG tablet Take 40 mg by mouth every evening.    Historical Provider, MD  venlafaxine XR (EFFEXOR-XR) 75 MG 24 hr capsule Take 75 mg by mouth daily with breakfast.    Historical Provider, MD   BP 133/77 mmHg  Pulse 95  Temp(Src) 98.5 F (36.9 C) (Oral)  Resp 22  SpO2 95% Physical Exam  Constitutional: He is oriented to  person, place, and time. He appears well-developed and well-nourished. No distress.  HENT:  Mouth/Throat: No oropharyngeal exudate.  Bilateral TMs are normal Oropharynx minor injection. No exudates. Tongue, buccal mucosa and oropharynx discolored due to daily smoking.  Eyes: Conjunctivae and EOM are normal.  Neck: Normal range of motion.  Cardiovascular: Normal rate, regular rhythm and normal heart sounds.   Pulmonary/Chest: Effort normal.  Laterality bilateral coarseness, wheezing and with, productive cough.  Musculoskeletal: He exhibits no edema.  Lymphadenopathy:    He has no cervical adenopathy.  Neurological: He is alert and oriented to person, place, and time.  Skin: Skin is warm and dry.  Psychiatric: He has a normal mood and affect.  Nursing note and vitals reviewed.   ED Course  Procedures (including critical care time) Labs Review Labs Reviewed - No data to display  Imaging Review Dg Chest 2 View  09/21/2014   CLINICAL DATA:  Dyspnea. Wheezing and coarsened  breath sounds bilaterally. COPD. Cough and shortness of breath.  EXAM: CHEST  2 VIEW  COMPARISON:  02/15/2014  FINDINGS: Mild hyperinflation. Lower cervical spine fixation. Remote bilateral rib trauma. Midline trachea. Normal heart size and mediastinal contours. No pleural effusion or pneumothorax. Mild pulmonary interstitial thickening. No lobar consolidation.  IMPRESSION: 1.  No acute cardiopulmonary disease. 2. Mild peribronchial thickening which may relate to chronic bronchitis or smoking.   Electronically Signed   By: Abigail Miyamoto M.D.   On: 09/21/2014 19:58     MDM   1. COPD with exacerbation   2. Bronchospasm    After the DuoNeb and additional albuterol neb the patient states he is feeling much better. He does have better air movement and decrease in wheezing coarseness although they are still present. Patient states he feels well enough to go home. He will continue to use the albuterol HFA and he is encouraged  call his physician to obtain a nebulizer and albuterol solution for home use. He is on a very low-dose of prednisone due to his diabetes and has a couple more doses remaining. Continue the  came [facility] If worse especially with fever, worsening breathing or other problems may return or go to emergency department.   Janne Napoleon, NP 09/21/14 2017

## 2014-09-26 ENCOUNTER — Ambulatory Visit (INDEPENDENT_AMBULATORY_CARE_PROVIDER_SITE_OTHER): Payer: Commercial Managed Care - PPO | Admitting: Ophthalmology

## 2014-09-27 ENCOUNTER — Ambulatory Visit (INDEPENDENT_AMBULATORY_CARE_PROVIDER_SITE_OTHER): Payer: Commercial Managed Care - PPO | Admitting: Ophthalmology

## 2014-10-29 ENCOUNTER — Encounter: Payer: Self-pay | Admitting: Pulmonary Disease

## 2014-10-29 ENCOUNTER — Ambulatory Visit (INDEPENDENT_AMBULATORY_CARE_PROVIDER_SITE_OTHER): Payer: 59 | Admitting: Pulmonary Disease

## 2014-10-29 VITALS — BP 122/76 | HR 92 | Ht 69.0 in | Wt 162.0 lb

## 2014-10-29 DIAGNOSIS — J449 Chronic obstructive pulmonary disease, unspecified: Secondary | ICD-10-CM

## 2014-10-29 DIAGNOSIS — R042 Hemoptysis: Secondary | ICD-10-CM

## 2014-10-29 MED ORDER — PREDNISONE 10 MG PO TABS
ORAL_TABLET | ORAL | Status: DC
Start: 2014-10-29 — End: 2015-03-28

## 2014-10-29 NOTE — Assessment & Plan Note (Signed)
The patient has chronic asthmatic bronchitis related to ongoing airway inflammation from ongoing tobacco abuse. He did not have significant COPD by spirometry in 2012, but suspect that he does have significant air trapping. I agree with treating him with a LABA/ICS for now in order to minimize his acute exacerbations, however, he will not improve until he totally quit smoking.

## 2014-10-29 NOTE — Assessment & Plan Note (Signed)
The patient has had 2 episodes of small quantity hemoptysis during his 4 week course of acute bronchitis, but now has totally resolved. He has had 2 x-rays during this time which have not shown an acute process. He is also had a CT chest last August that did not show any significant parenchymal abnormality. I suspect the episode was related to his infection, I have discussed with him the possibility of an occult endobronchial lesion given his long-standing smoking history. If he has another episode this year, I would consider an airway exam with bronchoscopy. The patient states that he will call us if this occurs again.

## 2014-10-29 NOTE — Progress Notes (Signed)
   Subjective:    Patient ID: Schyler Counsell Haste, male    DOB: 1956/04/10, 59 y.o.   MRN: [id number]  HPI The patient is a 59 year old male who I've been asked to see for breathing issues as well as hemoptysis. She has a long-standing history of tobacco abuse, as well as recurrent episodes of acute asthmatic bronchitis. He has had spirometry in 2012 that showed no significant obstruction, but he did have a significant reduction in his forced vital capacity that was probably secondary to air trapping. His flow volume loop at the time appeared to be totally normal. The patient is currently on an LABA/ICS for his ongoing symptoms, but unfortunately continues to smoke. He had an episode of possibly 4 weeks ago with a flulike illness, and was felt to be complicated by an acute bronchitis and flare of his airway inflammation. He is been treated with Tamiflu and doxycycline, as well as a course of prednisone. We'll set he is more than 50% back to his usual baseline, but continues to have some wheezing and rattling. Approximately 2 weeks ago, he had an episode of bright red blood that was approximately a teaspoon and admixed with some purulence. This occurred only one time on that [place], and one other time the following [place]. He has not had any since. He has had 2 x-rays during this 4 week period, with the one in March being reviewed and showed no acute process.  The patient denies any pleuritic chest pain or significant lower extremity edema. He has not been having unexplained weight loss.   Review of Systems  Constitutional: Positive for unexpected weight change. Negative for fever.  HENT: Positive for congestion and dental problem. Negative for ear pain, nosebleeds, postnasal drip, rhinorrhea, sinus pressure, sneezing, sore throat and trouble swallowing.   Eyes: Negative for redness and itching.  Respiratory: Positive for cough and shortness of breath. Negative for chest tightness and wheezing.   Cardiovascular:  Positive for chest pain. Negative for palpitations and leg swelling.  Gastrointestinal: Negative for nausea and vomiting.  Genitourinary: Negative for dysuria.  Musculoskeletal: Negative for joint swelling.  Skin: Negative for rash.  Neurological: Positive for headaches.  Hematological: Does not bruise/bleed easily.  Psychiatric/Behavioral: Positive for dysphoric mood. The patient is nervous/anxious.        Objective:   Physical Exam Constitutional:  Well developed, no acute distress  HENT:  Nares patent without discharge  Oropharynx without exudate, palate and uvula are normal  Eyes:  Perrla, eomi, no scleral icterus  Neck:  No JVD, no TMG  Cardiovascular:  Normal rate, regular rhythm, no rubs or gallops.  No murmurs        Intact distal pulses  Pulmonary :  Mildly decreased bs, no stridor or respiratory distress   +diffuse rhonchi, no wheezing or crackles.  Abdominal:  Soft, nondistended, bowel sounds present.  No tenderness noted.   Musculoskeletal:  No lower extremity edema noted.  Lymph Nodes:  No cervical lymphadenopathy noted  Skin:  No cyanosis noted  Neurologic:  Alert, appropriate, moves all 4 extremities without obvious deficit.         Assessment & Plan:

## 2014-10-29 NOTE — Patient Instructions (Signed)
Stay on your current inhaler. You really need to stop smoking in order to stop your ongoing flareups. Will treat with an 8 Schmid course of prednisone to get you thru this episode. If you cough up blood again in next 46mos, let us know.  Would consider an airway exam at that time, as we discussed.  followup again as needed.

## 2014-11-15 ENCOUNTER — Other Ambulatory Visit: Payer: Self-pay | Admitting: *Deleted

## 2014-11-15 NOTE — Patient Outreach (Signed)
Left message on home phone requesting that [place] call Arville Care, care management assistant, at [phone] to schedule Link To Wellness follow up appointment. [name] RN,CCM,CDE [place] Management Coordinator Office Phone [phone] Office Fax [phone]

## 2014-11-26 ENCOUNTER — Other Ambulatory Visit: Payer: Self-pay | Admitting: *Deleted

## 2014-11-28 NOTE — Patient Outreach (Signed)
[facility]) Care Management   11/26/14  James [name] 1956/02/25 [id number]  James [name] is an 59 y.o. male who presents for routine Link To Wellness follow up for self management assistance with Type I DM.  Subjective:  Complains of chronic fatigue, states James [name] saw Juliann Pulse at [name] office last week on 5/3 and she made changes to his insulin pump settings and drew labs. Says his meter/pump download showed James [name] was having lows from 2-6 am. States his hypoglycemic threshold is around 60. Says James [name] sees Juliann Pulse every 6 weeks and sees [name] every 3 months. 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  Objective:   Review of Systems  Constitutional: Negative.     Physical Exam  Constitutional: James [name] is oriented to person, place, and time. James [name] appears well-developed and well-nourished.  Neurological: James [name] is alert and oriented to person, place, and time.  Psychiatric: James [name] has a normal mood and affect. His behavior is normal. Judgment and thought content normal.   Filed Weights   11/26/14 1410  Weight: 161 lb 12.8 oz (73.392 kg)   Filed Vitals:   11/26/14 1410  BP: 122/70   James [name]  wears an Animas insulin pump and continuous glucose monitor.  Current Medications:   Current Outpatient Prescriptions  Medication Sig Dispense Refill  . acetaminophen (TYLENOL) 500 MG tablet Take 1,000 mg by mouth every 6 (six) hours as needed for pain.    [name] albuterol (PROVENTIL HFA;VENTOLIN HFA) 108 (90 BASE) MCG/ACT inhaler Inhale 2 puffs into the lungs every 6 (six) hours as needed for wheezing or shortness of breath. 1 Inhaler 1  . budesonide-formoterol (SYMBICORT) 160-4.5 MCG/ACT inhaler Inhale 2 puffs into the lungs 2 (two) times daily.    [name] HYDROcodone-homatropine (HYDROMET) 5-1.5 MG/5ML syrup Take 5 mLs by mouth every 6 (six) hours as needed for cough. 120 mL 0  . ibuprofen (ADVIL,MOTRIN) 200 MG tablet Take 400 mg by mouth  every 6 (six) hours as needed for moderate pain.     . Insulin Human (INSULIN PUMP) SOLN Inject into the skin continuous. Humalog 100 units/ml    . predniSONE (DELTASONE) 10 MG tablet Take 4 tabs daily x 2 days, 3 tabs daily x 2 days, 2 tabs daily x 2 days, 1 tab daily x 2 days 20 tablet 0  . traZODone (DESYREL) 50 MG tablet Take 50 mg by mouth at bedtime.     No current facility-administered medications for this visit.    Functional Status:   In your present state of health, do you have any difficulty performing the following activities: 11/26/2014 02/12/2014  Hearing? Tempie Donning  Vision? N Y  Difficulty concentrating or making decisions? N N  Walking or climbing stairs? N N  Dressing or bathing? N N  Doing errands, shopping? N N    Fall/Depression Screening:    PHQ 2/9 Scores 11/26/2014  PHQ - 2 Score 1  . [facility] CM Care Plan Problem One        Patient Outreach from 11/26/2014 in [place] Problem One  Type I DM with most recent A1C= 7.4% on 10/08/13   Care Plan for Problem One  Active   [facility] Long Term Goal (31-90 days)  Improved glycemic control as evidenced by A1C< 7.4% at next check    [facility] Long Term Goal Start Date  11/26/14   Interventions for  Problem One Long Term Goal  reviewed pathophysiology of Type I DM, reviewed recent visits with healthcare providers, reviewed treatment of hypoglycemia using the rule of 15s, discussed importance of yearly eye exams and  twice yearly dental exams,  arranged for Link To Wellness follow up      Assessment:   Link To Wellness member with Type I DM . Most recent A1C= 7.4%.  Plan:  Will fax today's note to [name]. Will continue to see patient but will decrease Link To Wellness visits to 2-3 times yearly to assess progress toward mutually set goals asa  James [name] sees James [name] sees his endocrinologist every 3 months and a pharmacist every 6 weeks for DM self management.   [name] RN,CCM,CDE [place]  Management Coordinator Link To Wellness Office Phone [phone] Office Fax [phone]

## 2015-01-13 ENCOUNTER — Other Ambulatory Visit: Payer: Self-pay

## 2015-01-16 ENCOUNTER — Telehealth: Payer: Self-pay | Admitting: Pulmonary Disease

## 2015-01-16 NOTE — Telephone Encounter (Signed)
lmtcb x1 for pt. 

## 2015-01-17 NOTE — Telephone Encounter (Signed)
Pt calling stating that he no longer need ov, he went to see his pcp and he took care of what he needed, he says  To tell you thank you for all you did to make this appointment for him.Hillery Hunter

## 2015-01-17 NOTE — Telephone Encounter (Signed)
LMTCB x2  

## 2015-02-12 DIAGNOSIS — F329 Major depressive disorder, single episode, unspecified: Secondary | ICD-10-CM | POA: Insufficient documentation

## 2015-03-28 ENCOUNTER — Encounter: Payer: Self-pay | Admitting: Pulmonary Disease

## 2015-03-28 ENCOUNTER — Ambulatory Visit (INDEPENDENT_AMBULATORY_CARE_PROVIDER_SITE_OTHER): Payer: 59 | Admitting: Pulmonary Disease

## 2015-03-28 ENCOUNTER — Telehealth: Payer: Self-pay | Admitting: Pulmonary Disease

## 2015-03-28 ENCOUNTER — Encounter: Payer: Self-pay | Admitting: *Deleted

## 2015-03-28 VITALS — BP 134/68 | HR 66 | Temp 98.6°F | Ht 69.0 in | Wt 157.8 lb

## 2015-03-28 DIAGNOSIS — Z720 Tobacco use: Secondary | ICD-10-CM | POA: Diagnosis not present

## 2015-03-28 DIAGNOSIS — J449 Chronic obstructive pulmonary disease, unspecified: Secondary | ICD-10-CM | POA: Diagnosis not present

## 2015-03-28 DIAGNOSIS — J45901 Unspecified asthma with (acute) exacerbation: Secondary | ICD-10-CM | POA: Diagnosis not present

## 2015-03-28 MED ORDER — PREDNISONE 20 MG PO TABS: ORAL_TABLET | ORAL | Status: DC

## 2015-03-28 MED ORDER — ALBUTEROL SULFATE (2.5 MG/3ML) 0.083% IN NEBU
2.5000 mg | INHALATION_SOLUTION | Freq: Once | RESPIRATORY_TRACT | Status: AC
  Administered 2015-03-28: 2.5 mg via RESPIRATORY_TRACT

## 2015-03-28 MED ORDER — HYDROCODONE-HOMATROPINE 5-1.5 MG/5ML PO SYRP: 5.0000 mL | ORAL_SOLUTION | Freq: Four times a day (QID) | ORAL | Status: DC | PRN

## 2015-03-28 NOTE — Progress Notes (Signed)
Chief Complaint  Patient presents with  . Acute Visit    former [place] pt - chest soreness, wheezing, increased SOB, body aches, PND, chest congestion, cough, and weakness.  Mucus now clear.  Chills qhs, no fever.  Symptoms started x 3 days ago.  Started on zpak x 2 days ago and was seen by Endocrinology office yesterday for symptoms - was given steroid shot.    History of Present Illness: James [name] is a 59 y.o. male smoker with COPD with asthma.  He was previously followed by Dr. Joya Gaskins and Dr. Gwenette Greet.  He was treated for pneumonia a few months ago, and had recovered.  He has stopped using his inhalers because he didn't feel like he needed them anymore.  He still smokes about 1/2 pack per [place].  Two days ago he developed fever, headache, fatigue, sinus congestion, post-nasal drip, sore throat, cough with yellow sputum, and aches.  He has also been getting wheezing.  He started using Breo and Respiclick again.  He was seen in his PCP office.  CXR reported to be negative for pneumonia.  He was started on Zpak.  He went back to his PCP office and got a steroid shot >> this helped.  He did not get any steroid pills.  He quit smoking several years ago, but started again.  He has script for chantix, but has not started this yet.   TESTS: Spirometry 2012 >> FEV1 1.94 (52%)  PMhx >> HLD, Anxiety/depression, Spinal stenosis, CAD, DM type I with retinopathy, CKD  Past surgical hx, Medications, Allergies, Family hx, Social hx all reviewed.   Physical Exam: BP 134/68 mmHg  Pulse 66  Temp(Src) 98.6 F (37 C) (Oral)  Ht 5\' 9"  (1.753 m)  Wt 157 lb 12.8 oz (71.578 kg)  BMI 23.29 kg/m2  SpO2 95%  General - No distress, thin ENT - No sinus tenderness, no oral exudate, no LAN, clear nasal drainage Cardiac - s1s2 regular, no murmur Chest - b/l wheeze with scattered rhonchi Back - No focal tenderness Abd - Soft, non-tender Ext - No edema Neuro - Normal strength Skin - No rashes Psych - normal  mood, and behavior   Assessment/Plan:  Acute asthmatic bronchitis. Plan: - complete course of zithromax from PCP - will give him course of prednisone >> advised him to d/w [name] about his insulin regimen - will give him hydromet to help with cough - continue Breo, and albuterol for now - letter written him to stay out of work through 03/31/15 >> he is to call if he needs work leave stretched out further  Tobacco abuse. Plan: - he will start chantix once he has recovered from acute illness - reviewed dosing schedule and side effects to monitor for from chantix   Chesley Mires, MD [place] [facility]/Critical [facility]/Sleep Pager:  [phone]

## 2015-03-28 NOTE — Patient Instructions (Addendum)
Prednisone 20 mg pills >> 2 pills daily for 2 days, 1.5 pills daily for 2 days, 1 pill daily for 2 days, 0.5 pills daily for 2 days  Hydromet every 6 hours as needed for cough  Follow up in 2 to 3 weeks with Dr. Halford Chessman or Tammy Parrett

## 2015-03-28 NOTE — Telephone Encounter (Signed)
Called pt. He has not been seen since April and by [facility].  Advised pt will need OV. appt scheduled to see VS at 11:15 this AM. Nothing further needed

## 2015-04-01 ENCOUNTER — Telehealth: Payer: Self-pay | Admitting: Pulmonary Disease

## 2015-04-01 NOTE — Telephone Encounter (Signed)
lmtcb for pt.  

## 2015-04-02 MED ORDER — DOXYCYCLINE HYCLATE 100 MG PO TABS: 100.0000 mg | ORAL_TABLET | Freq: Two times a day (BID) | ORAL | Status: DC

## 2015-04-02 MED ORDER — PREDNISONE 10 MG PO TABS: 10.0000 mg | ORAL_TABLET | Freq: Every day | ORAL | Status: DC

## 2015-04-02 MED ORDER — HYDROCODONE-HOMATROPINE 5-1.5 MG/5ML PO SYRP: 5.0000 mL | ORAL_SOLUTION | Freq: Four times a day (QID) | ORAL | Status: DC | PRN

## 2015-04-02 NOTE — Telephone Encounter (Signed)
Spoke with the pt's spouse and notified of recs per VS  She verbalized understanding  Doxy and Pred sent to [facility] pharm  PW singed hydromet rx and it was placed up front for pick up

## 2015-04-02 NOTE — Telephone Encounter (Signed)
Send script for doxycycline 100 mg, 1 pill bid for 7 days, dispense 14  Send script for prednisone 10 mg pills >> 2 pills daily for 3 days, 1 pill daily for 3 days.  Dispense 9 pills.  Send refill for hydromet.

## 2015-04-02 NOTE — Telephone Encounter (Signed)
Spoke with the pt  He states he is not feeling much better since his last visit here 03/28/15 He is still cough with yellow sputum, SOB and wheezing  He has 2 days left of pred taper and is already out of hydromet (120 ml should have lasted 6 days but this is Rufo 5) I advised needs to come in for eval and he refused, stating that he already returned to work  VS please advise thanks!

## 2015-04-04 ENCOUNTER — Ambulatory Visit (INDEPENDENT_AMBULATORY_CARE_PROVIDER_SITE_OTHER)
Admission: RE | Admit: 2015-04-04 | Discharge: 2015-04-04 | Disposition: A | Discharge status: Home / Self Care | Payer: 59 | Source: Ambulatory Visit | Attending: Internal Medicine | Admitting: Internal Medicine

## 2015-04-04 ENCOUNTER — Ambulatory Visit (INDEPENDENT_AMBULATORY_CARE_PROVIDER_SITE_OTHER): Payer: 59 | Admitting: Internal Medicine

## 2015-04-04 ENCOUNTER — Telehealth: Payer: Self-pay | Admitting: Internal Medicine

## 2015-04-04 ENCOUNTER — Encounter: Payer: Self-pay | Admitting: Internal Medicine

## 2015-04-04 VITALS — BP 124/68 | HR 87 | Temp 97.6°F | Ht 69.0 in | Wt 154.0 lb

## 2015-04-04 DIAGNOSIS — J449 Chronic obstructive pulmonary disease, unspecified: Secondary | ICD-10-CM

## 2015-04-04 DIAGNOSIS — F17210 Nicotine dependence, cigarettes, uncomplicated: Secondary | ICD-10-CM

## 2015-04-04 DIAGNOSIS — Z720 Tobacco use: Secondary | ICD-10-CM

## 2015-04-04 MED ORDER — PREDNISONE 10 MG PO TABS: ORAL_TABLET | ORAL | Status: DC

## 2015-04-04 MED ORDER — HYDROCODONE-HOMATROPINE 5-1.5 MG/5ML PO SYRP
5.0000 mL | ORAL_SOLUTION | Freq: Four times a day (QID) | ORAL | Status: DC | PRN
Start: 2015-04-04 — End: 2015-05-22

## 2015-04-04 MED ORDER — BUDESONIDE-FORMOTEROL FUMARATE 160-4.5 MCG/ACT IN AERO: INHALATION_SPRAY | RESPIRATORY_TRACT | Status: DC

## 2015-04-04 NOTE — Patient Instructions (Addendum)
Prednisone 10 mg Take 4 for three days 3 for three days 2 for three days 1 for three days and stop   Stop BREO and start symbicort 160 Take 2 puffs first thing in am and then another 2 puffs about 12 hours later.   Only use your albuterol as a rescue medication to be used if you can't catch your breath by resting or doing a relaxed purse lip breathing pattern.  - The less you use it, the better it will work when you need it. - Ok to use up to 2 puffs  every 4 hours if you must but call for immediate appointment if use goes up over your usual need - Don't leave home without it !!  (think of it like the spare tire for your car)  Use cough medication   GERD (REFLUX)  is an extremely common cause of respiratory symptoms just like yours , many times with no obvious heartburn at all.    It can be treated with medication, but also with lifestyle changes including elevation of the head of your bed (ideally with 6 inch  bed blocks),  Smoking cessation, avoidance of late meals, excessive alcohol, and avoid fatty foods, chocolate, peppermint, colas, red wine, and acidic juices such as orange juice.  NO MINT OR MENTHOL PRODUCTS SO NO COUGH DROPS  USE SUGARLESS CANDY INSTEAD (Jolley ranchers or Stover's or Life Savers) or even ice chips will also do - the key is to swallow to prevent all throat clearing. NO OIL BASED VITAMINS - use powdered substitutes.  Rec also chantix now    Keep follow up appts

## 2015-04-04 NOTE — Telephone Encounter (Signed)
Spoke with pt and notified of results per Dr. Wert. Pt verbalized understanding and denied any questions. 

## 2015-04-04 NOTE — Progress Notes (Signed)
History of Present Illness: James [name] is a 60 y.o. male active smoker with COPD with asthma.   Ov 03/28/15 James [name]: He was treated for pneumonia a few months prior to [place]   and had recovered.  He has stopped using his inhalers because he didn't feel like he needed them anymore.  He still smokes about 1/2 pack per James [name].  Two days ago he developed fever, headache, fatigue, sinus congestion, post-nasal drip, sore throat, cough with yellow sputum, and aches.  He has also been getting wheezing.  He started using Breo and Respiclick again.  He was seen in his James [name] office.  CXR reported to be negative for pneumonia.  He was started on Zpak.  He went back to his James [name] office and got a steroid shot >> this helped.  He did not get any steroid pills.  He quit smoking several years ago, but started again.  He has script for chantix, but has not started this yet. rec pred/ hydromet / added doxy  rec Prednisone 20 mg pills >> 2 pills daily for 2 days, 1.5 pills daily for 2 days, 1 pill daily for 2 days, 0.5 pills daily for 2 days Hydromet every 6 hours as needed for cough    04/04/2015 acute extended ov/James [name] re: aecopd/ still smoking / on breo and saba and presently on doxy Chief Complaint  Patient presents with  . Acute Visit    Pt seen for acute visit 1 wk ago- cough seems worse and woke up in the night and could not catch his breath.  His cough is prod with clear sputum.     says he did not want to start Chantix until he was feeling better because of all the side effects that have been reported but the patient tells me he's actually been on Chantix before without any significant side effects.  Maintained on Breo and lots of saba and not better since last ov and convinced he has pna  No obvious James [name] to James [name] or daytime variability or assoc   cp or chest tightness, subjective wheeze or overt sinus or hb symptoms. No unusual exp hx or h/o childhood pna/ asthma or knowledge of premature birth.    Also denies any  obvious fluctuation of symptoms with weather or environmental changes or other aggravating or alleviating factors except as outlined above   Current Medications, Allergies, Complete Past Medical History, Past Surgical History, Family History, and Social History were reviewed in [employer] record.  ROS  The following are not active complaints unless bolded sore throat, dysphagia, dental problems, itching, sneezing,  nasal congestion or excess/ purulent secretions, ear ache,   fever, chills, sweats, unintended wt loss, classically pleuritic or exertional cp, hemoptysis,  orthopnea pnd or leg swelling, presyncope, palpitations, abdominal pain, anorexia, nausea, vomiting, diarrhea  or change in bowel or bladder habits, change in stools or urine, dysuria,hematuria,  rash, arthralgias, visual complaints, headache, numbness, weakness or ataxia or problems with walking or coordination,  change in mood/affect or memory.          TESTS: Spirometry 2012 >> FEV1 1.94 (52%)  PMhx >> HLD, Anxiety/depression, Spinal stenosis, CAD, DM type I with retinopathy, CKD      Physical Exam:  Wt Readings from Last 3 Encounters:  04/04/15 154 lb (69.854 kg)  03/28/15 157 lb 12.8 oz (71.578 kg)  11/26/14 161 lb 12.8 oz (73.392 kg)    Vital signs reviewed   amb wm with congested sounding  cough/ no increased wob   General - No distress, thin ENT - No sinus tenderness, no oral exudate, no LAN, clear nasal drainage Cardiac - s1s2 regular, no murmur Chest - severe bilateral insp and exp rhonchi Back - No focal tenderness Abd - Soft, non-tender Ext - No edema Neuro - Normal strength Skin - No rashes Psych - normal mood, and behavior       CXR PA and Lateral:   04/04/2015 :     I personally reviewed images and agree with radiology impression as follows:    No radiographic evidence of acute cardiopulmonary disease, with background of chronic lung changes.

## 2015-04-04 NOTE — Telephone Encounter (Signed)
No acute changes so no change in rx

## 2015-04-04 NOTE — Telephone Encounter (Signed)
Patient wants to know results of his CXR.   Do not see that Dr. Melvyn Novas has resulted this out yet Can talk to wife or leave results on voicemail.

## 2015-04-06 ENCOUNTER — Encounter: Payer: Self-pay | Admitting: Internal Medicine

## 2015-04-06 DIAGNOSIS — F17210 Nicotine dependence, cigarettes, uncomplicated: Secondary | ICD-10-CM | POA: Insufficient documentation

## 2015-04-06 NOTE — Assessment & Plan Note (Addendum)
Arlyce Harman 2012:  FEV1 1.94 (52 %) but ratio normal at 84% >>suspect has airtrapping rather than restriction to explain low FVC.   Symptoms remain severe/ refractory and difficult to control this point. DDX of  difficult airways management all start with A and  include Adherence, Ace Inhibitors, Acid Reflux, Active Sinus Disease, Alpha 1 Antitripsin deficiency, Anxiety masquerading as Airways dz,  ABPA,  allergy(esp in young), Aspiration (esp in elderly), Adverse effects of meds,  Active smokers, A bunch of PE's (a small clot burden can't cause this syndrome unless there is already severe underlying pulm or vascular dz with poor reserve) plus two Bs  = Bronchiectasis and Beta blocker use..and one C= CHF   Adherence is always the initial "prime suspect" and is a multilayered concern that requires a "trust but verify" approach in every patient - starting with knowing how to use medications, especially inhalers, correctly, keeping up with refills and understanding the fundamental difference between maintenance and prns vs those medications only taken for a very short course and then stopped and not refilled.  The proper method of use, as well as anticipated side effects, of a metered-dose inhaler are discussed and demonstrated to the patient. Improved effectiveness after extensive coaching during this visit to a level of approximately  75% so try symbicort 160 2bid    Active smoking greatest concern > see sep a/p   ? Adverse effects of DPI > try off BREO  ? Acid (or non-acid) GERD > always difficult to exclude as up to 75% of pts in some series report no assoc GI/ Heartburn symptoms> rec max   diet restrictions/ reviewed and instructions given in writing > consider trial of max ppi/h2 hs if not improving   ? Allergy > doubt but should cover with symbicort 160 and Prednisone 10 mg take  4 each am x 2 days,   2 each am x 2 days,  1 each am x 2 days and stop   ? active sinus dz/ doubt but should be covered on  doxy > consider sinus ct if remains refractory   Once he is improved he needs repeat PFTs to clarify his underlying pulmonary function as I doubt the above spirometry is reflective of his airways disease at present  I had an extended discussion with the patient reviewing all relevant studies completed to date and  lasting 15 to 20 minutes of a 25 minute visit    Each maintenance medication was reviewed in detail including most importantly the difference between maintenance and prns and under what circumstances the prns are to be triggered using an action plan format that is not reflected in the computer generated alphabetically organized AVS.    Please see instructions for details which were reviewed in writing and the patient given a copy highlighting the part that I personally wrote and discussed at today's ov.

## 2015-04-06 NOTE — Assessment & Plan Note (Signed)

## 2015-04-11 ENCOUNTER — Encounter: Payer: Self-pay | Admitting: Adult Health

## 2015-04-11 ENCOUNTER — Telehealth: Payer: Self-pay | Admitting: Adult Health

## 2015-04-11 ENCOUNTER — Ambulatory Visit (INDEPENDENT_AMBULATORY_CARE_PROVIDER_SITE_OTHER): Payer: 59 | Admitting: Adult Health

## 2015-04-11 VITALS — BP 122/70 | HR 80 | Ht 69.0 in | Wt 155.2 lb

## 2015-04-11 DIAGNOSIS — J449 Chronic obstructive pulmonary disease, unspecified: Secondary | ICD-10-CM

## 2015-04-11 MED ORDER — LEVALBUTEROL HCL 0.63 MG/3ML IN NEBU
0.6300 mg | INHALATION_SOLUTION | Freq: Once | RESPIRATORY_TRACT | Status: AC
  Administered 2015-04-11: 0.63 mg via RESPIRATORY_TRACT

## 2015-04-11 MED ORDER — ALBUTEROL SULFATE HFA 108 (90 BASE) MCG/ACT IN AERS: 2.0000 | INHALATION_SPRAY | Freq: Four times a day (QID) | RESPIRATORY_TRACT | Status: DC | PRN

## 2015-04-11 NOTE — Telephone Encounter (Signed)
Discussed with TP who reported that since MW refilled this medication on 9.16.16, it is too soon to be refilled Called spoke with patient and discussed the above Pt voiced his understanding and denied any questions/concerns Will sign off

## 2015-04-11 NOTE — Telephone Encounter (Signed)
Patient says that he saw James [name] today and he requested Hydromet, but he did not get the prescription before he left.   He says that he doesn't know why I am calling him because he asked to speak to James [name] only...  He said that [name] knows what he needs.  To [employer]

## 2015-04-11 NOTE — Patient Instructions (Addendum)
Continue on Symbicort 2 puffs Twice daily  , rinse after use.  Keep working on the not smoking  follow up Dr. Melvyn Novas  In 4 weeks with PFT and As needed   Please contact office for sooner follow up if symptoms do not improve or worsen or seek emergency care

## 2015-04-11 NOTE — Progress Notes (Signed)
   Subjective:    Patient ID: James [name], male    DOB: 10/06/1955, 59 y.o.   MRN: [id number]  HPI 59 yo smoker with COPD   TESTS: Spirometry 2012 >> FEV1 1.94 (52%)  PMhx >> HLD, Anxiety/depression, Spinal stenosis, CAD, DM type I with retinopathy, CKD  04/11/2015 Follow up : COPD  Pt returns for 1 week follow up . Seen last ov with AECOPD . Tx w/  pred taper.  CXR showed COPD changes with no sign of PNA.  He is starting to feel better but still has ongoing cough .   Still smoking but has started chantix, discussed smoking cessation.  He denies fever, discolored mucus , chest pain, orthopnea or edema.  Pt has DM type 1 . Has had a couple courses of prednisone .    Review of Systems  Constitutional:   No  weight loss, night sweats,  Fevers, chills, + fatigue, or  lassitude.  HEENT:   No headaches,  Difficulty swallowing,  Tooth/dental problems, or  Sore throat,                No sneezing, itching, ear ache,  +nasal congestion, post nasal drip,   CV:  No chest pain,  Orthopnea, PND, swelling in lower extremities, anasarca, dizziness, palpitations, syncope.   GI  No heartburn, indigestion, abdominal pain, nausea, vomiting, diarrhea, change in bowel habits, loss of appetite, bloody stools.   Resp:   No chest wall deformity  Skin: no rash or lesions.  GU: no dysuria, change in color of urine, no urgency or frequency.  No flank pain, no hematuria   MS:  No joint pain or swelling.  No decreased range of motion.  No back pain.  Psych:  No change in mood or affect. No depression or anxiety.  No memory loss.          Objective:   Physical Exam GEN: A/Ox3; pleasant , NAD Vitals signs reviewed   HEENT:  [place]/AT,  EACs-clear, TMs-wnl, NOSE-clear, THROAT-clear, no lesions, no postnasal drip or exudate noted.   NECK:  Supple w/ fair ROM; no JVD; normal carotid impulses w/o bruits; no thyromegaly or nodules palpated; no lymphadenopathy.  RESP  Few trace rhonchi .no accessory  muscle use, no dullness to percussion  CARD:  RRR, no m/r/g  , no peripheral edema, pulses intact, no cyanosis or clubbing.  GI:   Soft & nt; nml bowel sounds; no organomegaly or masses detected.  Musco: Warm bil, no deformities or joint swelling noted.   Neuro: alert, no focal deficits noted.    Skin: Warm, no lesions or rashes        Assessment & Plan:

## 2015-04-15 ENCOUNTER — Telehealth: Payer: Self-pay | Admitting: Internal Medicine

## 2015-04-15 NOTE — Progress Notes (Signed)
Chart and office note reviewed in detail  > agree with a/p as outlined    

## 2015-04-15 NOTE — Assessment & Plan Note (Addendum)
Slow to resolve flare  Hold on additional steroids for now  Smoking cessation is key.  Needs PFT  xopenex neb x 1   Plan  Continue on Symbicort 2 puffs Twice daily  , rinse after use.  Keep working on the not smoking  follow up Dr. Melvyn Novas  In 4 weeks with PFT and As needed   Please contact office for sooner follow up if symptoms do not improve or worsen or seek emergency care

## 2015-04-15 NOTE — Telephone Encounter (Signed)
lmomtcb x1 for pt 

## 2015-04-16 NOTE — Telephone Encounter (Signed)
lmomtcb x2 on VM

## 2015-04-17 NOTE — Telephone Encounter (Signed)
Spoke with the pt  He states at the time of original call on 04/15/15 he has felt his cough had been getting worse  Today he feels he has improved and feels that he can wait until 05/22/15 for evaluation  He will call again if needed

## 2015-04-29 ENCOUNTER — Ambulatory Visit: Payer: 59 | Admitting: *Deleted

## 2015-04-30 ENCOUNTER — Other Ambulatory Visit: Payer: Self-pay | Admitting: *Deleted

## 2015-04-30 NOTE — Patient Outreach (Signed)
Message left on James [name]'s home phone advising that he missed his Link To Wellness appointment at 2:00 pm on 10/11 and requested that he call [facility], Care Management assistant, at [phone], to reschedule as soon as possible. [name] RN,CCM,CDE [place] Management Coordinator Link To Wellness Office Phone [phone] Office Fax [phone]

## 2015-05-21 ENCOUNTER — Other Ambulatory Visit: Payer: Self-pay | Admitting: Internal Medicine

## 2015-05-21 DIAGNOSIS — R0600 Dyspnea, unspecified: Secondary | ICD-10-CM

## 2015-05-22 ENCOUNTER — Encounter: Payer: Self-pay | Admitting: Internal Medicine

## 2015-05-22 ENCOUNTER — Ambulatory Visit (INDEPENDENT_AMBULATORY_CARE_PROVIDER_SITE_OTHER): Payer: 59 | Admitting: Internal Medicine

## 2015-05-22 ENCOUNTER — Telehealth: Payer: Self-pay

## 2015-05-22 VITALS — BP 122/60 | HR 74 | Ht 69.0 in | Wt 163.0 lb

## 2015-05-22 DIAGNOSIS — J45909 Unspecified asthma, uncomplicated: Secondary | ICD-10-CM | POA: Diagnosis not present

## 2015-05-22 DIAGNOSIS — J449 Chronic obstructive pulmonary disease, unspecified: Secondary | ICD-10-CM

## 2015-05-22 DIAGNOSIS — R0600 Dyspnea, unspecified: Secondary | ICD-10-CM | POA: Diagnosis not present

## 2015-05-22 LAB — PULMONARY FUNCTION TEST
DL/VA % pred: 97 %
DL/VA: 4.45 ml/min/mmHg/L
DLCO UNC % PRED: 73 %
DLCO unc: 22.74 ml/min/mmHg
FEF 25-75 PRE: 1.92 L/s
FEF 25-75 Post: 1.89 L/sec
FEF2575-%Change-Post: -1 %
FEF2575-%Pred-Post: 65 %
FEF2575-%Pred-Pre: 66 %
FEV1-%Change-Post: 0 %
FEV1-%PRED-POST: 72 %
FEV1-%Pred-Pre: 72 %
FEV1-POST: 2.53 L
FEV1-PRE: 2.52 L
FEV1FVC-%Change-Post: 4 %
FEV1FVC-%PRED-PRE: 97 %
FEV6-%Change-Post: -3 %
FEV6-%Pred-Post: 74 %
FEV6-%Pred-Pre: 77 %
FEV6-Post: 3.28 L
FEV6-Pre: 3.4 L
FEV6FVC-%Change-Post: 0 %
FEV6FVC-%PRED-POST: 105 %
FEV6FVC-%PRED-PRE: 105 %
FVC-%Change-Post: -3 %
FVC-%Pred-Post: 71 %
FVC-%Pred-Pre: 74 %
FVC-PRE: 3.41 L
FVC-Post: 3.28 L
POST FEV1/FVC RATIO: 77 %
POST FEV6/FVC RATIO: 100 %
Pre FEV1/FVC ratio: 74 %
Pre FEV6/FVC Ratio: 100 %
RV % PRED: 113 %
RV: 2.49 L
TLC % PRED: 80 %
TLC: 5.5 L

## 2015-05-22 NOTE — Patient Instructions (Signed)
You have no copd and never will unless you resume smoking  You are eligible for lung cancer CT screening yearly x the next 15 years if you desire    If you are satisfied with your treatment plan,  let your doctor know and he/she can either refill your medications or you can return here when your prescription runs out.     If in any way you are not 100% satisfied,  please tell us.  If 100% better, tell your friends!  Pulmonary follow up is as needed

## 2015-05-22 NOTE — Progress Notes (Signed)
Subjective:    Patient ID: James [name], male    DOB: 15-Apr-1956,     MRN: [id number]    Brief patient profile:  32 yowm quit smoking 03/2015 with nl pfts 05/22/2015 off all rx  C/w AB / not copd   TESTS: Spirometry 2012 >> FEV1 1.94 (52%)  PMhx >> HLD, Anxiety/depression, Spinal stenosis, CAD, DM type I with retinopathy, CKD  04/11/2015 Follow up : COPD  Pt returns for 1 week follow up . Seen last ov with AECOPD . Tx w/  pred taper.  CXR showed COPD changes with no sign of PNA.  He is starting to feel better but still has ongoing cough .   Still smoking but has started chantix, discussed smoking cessation.  He denies fever, discolored mucus , chest pain, orthopnea or edema.  Pt has DM type 1 . Has had a couple courses of prednisone  rec Continue on Symbicort 2 puffs Twice daily  , rinse after use.  Keep working on the not smoking    05/22/2015  f/u ov/Wert re:  AB/ resolved off rx with nl pfs Chief Complaint  Patient presents with  . Follow-up    PFT done today. Cough has improved and he is breathing some better. Stopped smoking approx 2 months ago.      Not limited by breathing from desired activities    No obvious [place] to [place] or daytime variability or assoc chronic cough or cp or chest tightness, subjective wheeze or overt sinus or hb symptoms. No unusual exp hx or h/o childhood pna/ asthma or knowledge of premature birth.  Sleeping ok without nocturnal  or early am exacerbation  of respiratory  c/o's or need for noct saba. Also denies any obvious fluctuation of symptoms with weather or environmental changes or other aggravating or alleviating factors except as outlined above   Current Medications, Allergies, Complete Past Medical History, Past Surgical History, Family History, and Social History were reviewed in [employer] record.  ROS  The following are not active complaints unless bolded sore throat, dysphagia, dental problems, itching, sneezing,   nasal congestion or excess/ purulent secretions, ear ache,   fever, chills, sweats, unintended wt loss, classically pleuritic or exertional cp, hemoptysis,  orthopnea pnd or leg swelling, presyncope, palpitations, abdominal pain, anorexia, nausea, vomiting, diarrhea  or change in bowel or bladder habits, change in stools or urine, dysuria,hematuria,  rash, arthralgias, visual complaints, headache, numbness, weakness or ataxia or problems with walking or coordination,  change in mood/affect or memory.               Objective:   Physical Exam GEN: A/Ox3; pleasant , NAD   05/22/2015        163  Wt Readings from Last 3 Encounters:  04/11/15 155 lb 3.2 oz (70.398 kg)  04/04/15 154 lb (69.854 kg)  03/28/15 157 lb 12.8 oz (71.578 kg)    Vital signs reviewed     HEENT:  [place]/AT,  EACs-clear, TMs-wnl, NOSE-clear, THROAT-clear, no lesions, no postnasal drip or exudate noted.   NECK:  Supple w/ fair ROM; no JVD; normal carotid impulses w/o bruits; no thyromegaly or nodules palpated; no lymphadenopathy.  RESP   Clear to auscultation /no accessory muscle use, no dullness to percussion  CARD:  RRR, no m/r/g  , no peripheral edema, pulses intact, no cyanosis or clubbing.  GI:   Soft & nt; nml bowel sounds; no organomegaly or masses detected.  Musco: Warm bil, no  deformities or joint swelling noted.   Neuro: alert, no focal deficits noted.    Skin: Warm, no lesions or rashes    I personally reviewed images and agree with radiology impression as follows:  CXR:  04/04/15 No radiographic evidence of acute cardiopulmonary disease, with background of chronic lung changes.            Assessment & Plan:

## 2015-05-22 NOTE — Progress Notes (Signed)
PFT done today. 

## 2015-05-22 NOTE — Assessment & Plan Note (Addendum)
James [name] 2012:  FEV1 1.94 (52 %) but ratio normal at 84%   - pft's 05/22/2015  No airflow obst/ nl dlco and no meds at all prior   I had an extended final summary discussion with the patient reviewing all relevant studies completed to date and  lasting 15 to 20 minutes of a 25 minute visit on the following issues:    1) this is not copd I reviewed the Fletcher curve with the patient that basically indicates  if you quit smoking when your best [name] FEV1 is still well preserved (as is clearly  the case here)  it is highly unlikely you will progress to severe disease and informed the patient there was no medication on the market that has proven to alter the curve/ its downward trajectory  or the likelihood of progression of their disease.  Therefore stopping smoking and maintaining abstinence is the most important aspect of care, not choice of inhalers or for that matter, doctors.    2) it also isn't active ab/ though he is at risk and should keep the saba on hand for uri's but no maint rx  3) pulmonary f/u is prn

## 2015-06-02 ENCOUNTER — Other Ambulatory Visit: Payer: Self-pay | Admitting: *Deleted

## 2015-06-02 ENCOUNTER — Encounter: Payer: Self-pay | Admitting: *Deleted

## 2015-06-02 ENCOUNTER — Ambulatory Visit: Payer: 59 | Admitting: *Deleted

## 2015-06-02 VITALS — BP 118/78 | Ht 69.0 in | Wt 164.2 lb

## 2015-06-02 DIAGNOSIS — E10319 Type 1 diabetes mellitus with unspecified diabetic retinopathy without macular edema: Secondary | ICD-10-CM

## 2015-06-03 ENCOUNTER — Encounter: Payer: Self-pay | Admitting: *Deleted

## 2015-06-03 NOTE — Patient Outreach (Signed)
[facility]) Care Management   06/02/15  James [name] Journey Sep 02, 1955 [id number]  James [name] is an 59 y.o. male who presents to the [place] Management office for routine Link To Wellness follow up for self management assistance with Type I DM and hyperlipidemia. With patient's verbal permission, [place] [facility] [place] [facility] [place], observed James [name]'s Link To Wellness appointment.  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 Says he has noticed his blood sugar variability has increased with more highs and lows ( in the 40's). Says he is aware when he is low and will suspend his pump until he can get something to eat or drink. He has not required help in treating the lows. He usually eats no food until dinner and says he has done this most of his adult life. He does not know the date of  next appointment with his endocrinologist or the pharmacist at the endocrinologist's office. He says his Mom died at age 106 in 01/22/23 from cancer a month after her initial diagnosis and he is still coping to deal with her death. His father is 74 years old and still lives alone out of state despite having a stroke with residual weakness. James [name] says he stopped smoking in September at the advice of Dr. Melvyn Novas and because he stayed sick with a cough for several weeks. He is using Chantix and denies significant side effects.  Objective:   Review of Systems  Constitutional: Negative.     Physical Exam  Constitutional: He is oriented to person, place, and time. He appears well-developed and well-nourished.  Neurological: He is alert and oriented to person, place, and time.  Skin: Skin is warm and dry.  Psychiatric: He has a normal mood and affect. His behavior is  normal. Judgment and thought content normal.   Filed Weights   06/02/15 1439  Weight: 164 lb 3.2 oz (74.481 kg)   Filed Vitals:   06/02/15 1439  BP: 118/78  Fasting CBG= 107- checked by patient on his meter  Current Medications:   Current Outpatient Prescriptions  Medication Sig Dispense Refill  . acetaminophen (TYLENOL) 500 MG tablet Take 1,000 mg by mouth every 6 (six) hours as needed for pain.    [name] albuterol (VENTOLIN HFA) 108 (90 BASE) MCG/ACT inhaler Inhale 2 puffs into the lungs every 6 (six) hours as needed for wheezing or shortness of breath. 1 Inhaler 5  . budesonide-formoterol (SYMBICORT) 160-4.5 MCG/ACT inhaler Take 2 puffs first thing in am and then another 2 puffs about 12 hours later. 1 Inhaler 12  . CHANTIX CONTINUING MONTH PAK 1 MG tablet Take 1 tablet by mouth 2 (two) times daily.  2  . ibuprofen (ADVIL,MOTRIN) 200 MG tablet Take 400 mg by mouth every 6 (six) hours as needed for moderate pain.     . Insulin Human (INSULIN PUMP) SOLN Inject into the skin continuous. Humalog 100 units/ml    . SIMVASTATIN PO Take 1 tablet by mouth at bedtime.    . traZODone (DESYREL) 50 MG tablet Take 50 mg by mouth at bedtime.     No current facility-administered medications for this visit.    Functional Status:   In your present state of health, do you have any  difficulty performing the following activities: 06/02/2015 06/02/2015  Hearing? Tempie Donning  Vision? N N  Difficulty concentrating or making decisions? N N  Walking or climbing stairs? N N  Dressing or bathing? N N  Doing errands, shopping? N N    Fall/Depression Screening:    PHQ 2/9 Scores 11/26/2014  PHQ - 2 Score 1    Assessment:   Spouse of [employer] employee and Link To Wellness member with Type I DM and hyperlipidemia. Last A1C= 7.0% and lipid panel was normal on 02/12/15.  Plan:  . [facility] CM Care Plan Problem One        Most Recent Value   Care Plan Problem One  Type I DM with most recent A1C= 7.0% on 02/12/15    Role Documenting the Problem One  Care Management [place] for Problem One  Active   [facility] Long Term Goal (31-90 days)  Improved glycemic control as evidenced by A1C< 7.0% at next check    [facility] Long Term Goal Start Date  06/02/15   [facility] Long Term Goal Met Date     Interventions for Problem One Long Term Goal  Congratulated James [name] on smoking cessation, briefly reviewed pathophysiology of Type I DM, attempted to download [name] ' meter but was not successful, reviewed CBG history, advised James [name] to call to arrange office visit at [name] since he is having an average of one low <50 daily,  reviewed treatment of hypoglycemia using the rule of 15s, discussed importance of yearly eye exams and  twice yearly dental exams,  will arrange for Link To Wellness follow up in February 2017      RNCM to fax today's office visit note to [name] and Oren Section Pharm D at [name] office. RNCM will meet quarterly and as needed with patient per Link To Wellness program guidelines to assist with Type I DM and hyperlipidemia self-management and assess patient's progress toward mutually set goals.  [name] RN,CCM,CDE [place] Management Coordinator Link To Wellness Office Phone [phone] Office Fax [phone]

## 2015-06-05 ENCOUNTER — Ambulatory Visit: Payer: 59 | Admitting: *Deleted

## 2015-07-25 MED FILL — AMOX-CLAV 875-125 MG TABLET: 875-125 | 7 days supply | Qty: 14 | Fill #0

## 2015-07-25 MED FILL — HYDROCODONE-HOMATROPINE SYR: 5-1.5 | 9 days supply | Qty: 180 | Fill #0

## 2015-08-05 MED FILL — VENTOLIN HFA 90 MCG INHALER: 108 (90 BAS | 25 days supply | Qty: 18 | Fill #1

## 2015-08-05 MED FILL — SYMBICORT 160-4.5 MCG INH: 160-4.5 | 30 days supply | Qty: 10 | Fill #1

## 2015-08-06 ENCOUNTER — Telehealth: Payer: Self-pay | Admitting: Internal Medicine

## 2015-08-06 NOTE — Telephone Encounter (Signed)
Spoke with pt's wife, states that pt is wanting to switch providers-since christmas c/o URI symptoms-chest tightness, cough, PND, wheezing.  Pt has been put on zpak, augmentin, pred taper through PCP for this.  Pt has also been taking albuterol neb prn, but dislikes using this d/t causing "burning" in chest.   Pt's wife requesting acute visit this week-does not want to see MW.  Acute ov scheduled with RA in [facility] office tomorrow.  Nothing further needed at this time.

## 2015-08-06 NOTE — Telephone Encounter (Signed)
lmtcb x1 for pt. 

## 2015-08-07 ENCOUNTER — Ambulatory Visit (INDEPENDENT_AMBULATORY_CARE_PROVIDER_SITE_OTHER): Payer: 59 | Admitting: Pulmonary Disease

## 2015-08-07 ENCOUNTER — Ambulatory Visit (HOSPITAL_BASED_OUTPATIENT_CLINIC_OR_DEPARTMENT_OTHER)
Admission: RE | Admit: 2015-08-07 | Discharge: 2015-08-07 | Disposition: A | Discharge status: Home / Self Care | Payer: 59 | Source: Ambulatory Visit | Attending: Pulmonary Disease | Admitting: Pulmonary Disease

## 2015-08-07 ENCOUNTER — Encounter: Payer: Self-pay | Admitting: Pulmonary Disease

## 2015-08-07 VITALS — BP 109/74 | HR 92 | Ht 69.0 in | Wt 166.0 lb

## 2015-08-07 DIAGNOSIS — Z87891 Personal history of nicotine dependence: Secondary | ICD-10-CM

## 2015-08-07 DIAGNOSIS — J449 Chronic obstructive pulmonary disease, unspecified: Secondary | ICD-10-CM | POA: Insufficient documentation

## 2015-08-07 DIAGNOSIS — J45909 Unspecified asthma, uncomplicated: Secondary | ICD-10-CM

## 2015-08-07 DIAGNOSIS — R05 Cough: Secondary | ICD-10-CM | POA: Diagnosis not present

## 2015-08-07 MED ORDER — PREDNISONE 10 MG PO TABS: ORAL_TABLET | ORAL | Status: DC

## 2015-08-07 MED ORDER — LEVOFLOXACIN 500 MG PO TABS: 500.0000 mg | ORAL_TABLET | Freq: Every day | ORAL | Status: DC

## 2015-08-07 MED FILL — levoFLOXacin 500 MG TABS: 500 | 7 days supply | Qty: 7 | Fill #0

## 2015-08-07 MED FILL — predniSONE 10 MG TABS: 10 | 16 days supply | Qty: 40 | Fill #0

## 2015-08-07 NOTE — Progress Notes (Signed)
   Subjective:    Patient ID: James [name], male    DOB: [birth year], 60 y.o.   MRN: [id number]  HPI   49 yowm quit smoking 03/2015 with nl pfts 05/22/2015 off all rx C/w AB / not copd  Turns out he has chronic volume loss in his left lung on imaging, ? Congenital  He was previously followed by Dr. Joya [name] and Dr. Gwenette [name]  PMhx >> HLD, Anxiety/depression, Spinal stenosis, CAD, DM type I with retinopathy, CKD   08/07/2015  Chief Complaint  Patient presents with  . Acute Visit    MW pt here c/o increased SOB, wheezing, chest tightness/congestion, prod cough with tan colored mucus, nausesa at times, sinus drainage and pressure x 3 weeks. Denies any fever or vomiting. Completed zpak and prednisone 20mg  x 5 days.    He had recurrent attacks of bronchitis labored as COPD flare Saw MW in 05/2015 >> told he does not have COPD He has questions about this  Got sick around [place] - his father was down with a cold Given Z-pak, pred dose pak >. No relief then augmentin Keeps getting sick per wife James [name]  He was finally able to quit smoking in 03/2015 with Chantix   Pt has DM type 1 . Had multiple courses of prednisone   Chest x-ray today-no pneumonia  TESTS: Spirometry 2012 >> FEV1 1.94 (52%)  PFTs 05/2015 ratio 74, FEV1 72%, no BD response, mild restriction  CT sinus 08/2013 neg  Ct chest 01/2014 - left volume loss ? congenital   Review of Systems neg for any significant sore throat, dysphagia, itching, sneezing, nasal congestion or excess/ purulent secretions, fever, chills, sweats, unintended wt loss, pleuritic or exertional cp, hempoptysis, orthopnea pnd or change in chronic leg swelling. Also denies presyncope, palpitations, heartburn, abdominal pain, nausea, vomiting, diarrhea or change in bowel or urinary habits, dysuria,hematuria, rash, arthralgias, visual complaints, headache, numbness weakness or ataxia.     Objective:   Physical Exam  Gen. Pleasant, well-nourished, in no  distress, normal affect ENT - no lesions, no post nasal drip Neck: No JVD, no thyromegaly, no carotid bruits Lungs: no use of accessory muscles, no dullness to percussion,Bl scattered rhonchi  Cardiovascular: Rhythm regular, heart sounds  normal, no murmurs or gallops, no peripheral edema Abdomen: soft and non-tender, no hepatosplenomegaly, BS normal. Musculoskeletal: No deformities, no cyanosis or clubbing Neuro:  alert, non focal       Assessment & Plan:

## 2015-08-07 NOTE — Patient Instructions (Signed)
Levaquin 500 daily x 7 days Prednisone 10 mg tabs Take 4 tabs  daily with food x 4 days, then 3 tabs daily x 4 days, then 2 tabs daily x 4 days, then 1 tab daily x4 days then stop. #40   CXR today  Get back on symbicort 160 twice daily Use albuterol as needed

## 2015-08-07 NOTE — Assessment & Plan Note (Addendum)
Feel recurrent bronchitis related to volume loss in left lung and smoking  Levaquin 500 daily x 7 days Prednisone 10 mg tabs Take 4 tabs  daily with food x 4 days, then 3 tabs daily x 4 days, then 2 tabs daily x 4 days, then 1 tab daily x4 days then stop. #40    Get back on symbicort 160 twice daily Use albuterol as needed

## 2015-08-08 ENCOUNTER — Telehealth: Payer: Self-pay | Admitting: Pulmonary Disease

## 2015-08-08 NOTE — Telephone Encounter (Signed)
Notes Recorded by Rigoberto Noel, MD on 08/08/2015 at 8:35 AM No evidence of pneumonia ---------------- Spoke with pt, aware of results/recs.  Nothing further needed.

## 2015-08-08 NOTE — Assessment & Plan Note (Signed)
High risk for relapse Smoking cessation again emphasized

## 2015-08-27 MED FILL — SIMVASTATIN 40 MG TABLET: 40 | 90 days supply | Qty: 90 | Fill #2

## 2015-08-27 MED FILL — traZODone HCL 100 MG TABS: 100 | 90 days supply | Qty: 90 | Fill #1

## 2015-08-29 MED FILL — CONTOUR NEXT STRIPS: 37 days supply | Qty: 300 | Fill #3

## 2015-09-10 MED FILL — HumaLOG 100 UNIT/ML SOLN: 100 | 40 days supply | Qty: 40 | Fill #4

## 2015-09-10 MED FILL — CHANTIX 1 MG CONT MONTH BOX: 1 | 28 days supply | Qty: 56 | Fill #1

## 2015-09-25 MED FILL — SYMBICORT 160-4.5 MCG INH: 160-4.5 | 30 days supply | Qty: 10 | Fill #2

## 2015-10-05 ENCOUNTER — Emergency Department (HOSPITAL_COMMUNITY)
Admission: EM | Admit: 2015-10-05 | Discharge: 2015-10-05 | Disposition: A | Discharge status: Home / Self Care | Payer: 59 | Source: Home / Self Care | Attending: Emergency Medicine | Admitting: Emergency Medicine

## 2015-10-05 ENCOUNTER — Encounter (HOSPITAL_COMMUNITY): Payer: Self-pay | Admitting: *Deleted

## 2015-10-05 DIAGNOSIS — K047 Periapical abscess without sinus: Secondary | ICD-10-CM

## 2015-10-05 MED ORDER — AMOXICILLIN-POT CLAVULANATE 875-125 MG PO TABS: 1.0000 | ORAL_TABLET | Freq: Two times a day (BID) | ORAL | Status: DC

## 2015-10-05 NOTE — Discharge Instructions (Signed)
Dental Care and Dentist Visits Dental care supports good overall health. Regular dental visits can also help you avoid dental pain, bleeding, infection, and other more serious health problems in the future. It is important to keep the mouth healthy because diseases in the teeth, gums, and other oral tissues can spread to other areas of the body. Some problems, such as diabetes, heart disease, and pre-term labor have been associated with poor oral health.  See your dentist every 6 months. If you experience emergency problems such as a toothache or broken tooth, go to the dentist right away. If you see your dentist regularly, you may catch problems early. It is easier to be treated for problems in the early stages.  WHAT TO EXPECT AT A DENTIST VISIT  Your dentist will look for many common oral health problems and recommend proper treatment. At your regular dental visit, you can expect:  Gentle cleaning of the teeth and gums. This includes scraping and polishing. This helps to remove the sticky substance around the teeth and gums (plaque). Plaque forms in the mouth shortly after eating. Over time, plaque hardens on the teeth as tartar. If tartar is not removed regularly, it can cause problems. Cleaning also helps remove stains.  Periodic X-rays. These pictures of the teeth and supporting bone will help your dentist assess the health of your teeth.  Periodic fluoride treatments. Fluoride is a natural mineral shown to help strengthen teeth. Fluoride treatmentinvolves applying a fluoride gel or varnish to the teeth. It is most commonly done in children.  Examination of the mouth, tongue, jaws, teeth, and gums to look for any oral health problems, such as:  Cavities (dental caries). This is decay on the tooth caused by plaque, sugar, and acid in the mouth. It is best to catch a cavity when it is small.  Inflammation of the gums caused by plaque buildup (gingivitis).  Problems with the mouth or malformed  or misaligned teeth.  Oral cancer or other diseases of the soft tissues or jaws. KEEP YOUR TEETH AND GUMS HEALTHY For healthy teeth and gums, follow these general guidelines as well as your dentist's specific advice:  Have your teeth professionally cleaned at the dentist every 6 months.  Brush twice daily with a fluoride toothpaste.  Floss your teeth daily.  Ask your dentist if you need fluoride supplements, treatments, or fluoride toothpaste.  Eat a healthy diet. Reduce foods and drinks with added sugar.  Avoid smoking. TREATMENT FOR ORAL HEALTH PROBLEMS If you have oral health problems, treatment varies depending on the conditions present in your teeth and gums.  Your caregiver will most likely recommend good oral hygiene at each visit.  For cavities, gingivitis, or other oral health disease, your caregiver will perform a procedure to treat the problem. This is typically done at a separate appointment. Sometimes your caregiver will refer you to another dental specialist for specific tooth problems or for surgery. SEEK IMMEDIATE DENTAL CARE IF:  You have pain, bleeding, or soreness in the gum, tooth, jaw, or mouth area.  A permanent tooth becomes loose or separated from the gum socket.  You experience a blow or injury to the mouth or jaw area.   This information is not intended to replace advice given to you by your health care provider. Make sure you discuss any questions you have with your health care provider.   Document Released: 03/17/2011 Document Revised: 09/27/2011 Document Reviewed: 03/17/2011 Elsevier Interactive Patient Education 2016 [place] Abscess A  dental abscess is pus in or around a tooth. HOME CARE  Take medicines only as told by your dentist.  If you were prescribed antibiotic medicine, finish all of it even if you start to feel better.  Rinse your mouth (gargle) often with salt water.  Do not drive or use heavy machinery, like a lawn  mower, while taking pain medicine.  Do not apply heat to the outside of your mouth.  Keep all follow-up visits as told by your dentist. This is important. GET HELP IF:  Your pain is worse, and medicine does not help. GET HELP RIGHT AWAY IF:  You have a fever or chills.  Your symptoms suddenly get worse.  You have a very bad headache.  You have problems breathing or swallowing.  You have trouble opening your mouth.  You have puffiness (swelling) in your neck or around your eye.   This information is not intended to replace advice given to you by your health care provider. Make sure you discuss any questions you have with your health care provider.   Document Released: 11/19/2014 Document Reviewed: 11/19/2014 Elsevier Interactive Patient Education [employer].

## 2015-10-05 NOTE — ED Provider Notes (Signed)
CSN: [id number]     Arrival date & time 10/05/15  1303 History   None    Chief Complaint  Patient presents with  . Dental Pain   (Consider location/radiation/quality/duration/timing/severity/associated sxs/prior Treatment) HPI James [name] is a 60 year old Caucasian male who presents with right upper dental pain that began 3 days ago. The pain is a 6/10, and does not radiate. Ibuprofen 800 mg q8h and Tylenol help with the pain. He has had dental problems before, about 6 months ago, that were treated with antibiotics. He denies fever or chills, ear pain, rhinorrhea, sore throat, shortness of breath, chest pain. He is looking for a new dentist. Pertinent PMH includes diabetes mellitus type 1. His sugars are usually below 150 and have been maintained except for one reading of 250 yesterday.   Past Medical History  Diagnosis Date  . HYPERLIPIDEMIA 04/04/2007  . ANXIETY 04/03/2007  . DEPRESSION 04/03/2007  . ASTHMATIC BRONCHITIS, ACUTE 10/25/2008  . ASTHMA 09/06/2008  . ED (erectile dysfunction)   . Cervical disc disease   . Bladder neck obstruction   . COPD (chronic obstructive pulmonary disease) ([place])   . Spinal stenosis   . Depression   . Anxiety   . CORONARY ARTERY DISEASE 04/03/2007  . CARPAL TUNNEL SYNDROME, BILATERAL 07/31/2007    issues resolved, no surgery  . DIABETES MELLITUS, TYPE I 04/03/2007  . Diabetic retinopathy associated with diabetes mellitus due to underlying condition ([place]) 04/03/2007  . DM W/EYE MANIFESTATIONS, TYPE I, UNCONTROLLED 04/04/2007  . Pneumonia     "several times and again today" (02/13/2104)  . Chronic bronchitis ([place])     "get it about q yr" (02/12/2014)  . Seizures ([place])     "insulin seizure from time to time; none in the last couple years" (02/12/2014)  . Renal insufficiency   . DM W/RENAL MNFST, TYPE I, UNCONTROLLED 04/04/2007   Past Surgical History  Procedure Laterality Date  . Anterior cervical decomp/discectomy fusion  2000    "couple screws and a plate"   . Appendectomy    . Stress cardiolite  09/06/2002  . Tonsillectomy    . Lymph node dissection  ~ 1960    groin  . Cystoscopy with retrograde pyelogram, ureteroscopy and stent placement Bilateral 04/06/2013    Procedure: BILATERAL CYSTOSCOPY WITH RETROGRADE PYELOGRAMS, STENT PLACEMENTS AND LEFT URETEROSCOPY AND STONE REMOVAL;  Surgeon: Alexis Frock, MD;  Location: [facility];  Service: Urology;  Laterality: Bilateral;  . Holmium laser application Left [id number]    Procedure: HOLMIUM LASER APPLICATION;  Surgeon: Alexis Frock, MD;  Location: [facility];  Service: Urology;  Laterality: Left;  . Cystoscopy/retrograde/ureteroscopy Bilateral 04/12/2013    Procedure: CYSTOSCOPY/RETROGRADE/URETEROSCOPY;  Surgeon: Alexis Frock, MD;  Location: [facility];  Service: Urology;  Laterality: Bilateral;  RIGHT RETROGRADE   . Cystoscopy with stent placement Right 04/12/2013    Procedure: CYSTOSCOPY WITH STENT PLACEMENT;  Surgeon: Alexis Frock, MD;  Location: [facility];  Service: Urology;  Laterality: Right;  . Back surgery    . Cardiac catheterization  1990's  . Vitrectomy Bilateral   . Cataract extraction w/ intraocular lens  implant, bilateral Bilateral    Family History  Problem Relation Age of Onset  . Stroke Father     strong FH cerebrovascular disease  . Diabetes Brother   . Heart disease Brother     CHF  . Colon cancer Neg Hx   . Cancer Mother    Social History  Substance Use Topics  . Smoking status: Former Smoker -- 1.00  packs/[employer] for 41 years    Types: Cigarettes    Quit date: 04/02/2015  . Smokeless tobacco: Never Used  . Alcohol Use: No    Review of Systems See HPI.  Allergies  Iohexol and Lexapro  Home Medications   Prior to Admission medications   Medication Sig Start Date End Date Taking? Authorizing Provider  acetaminophen (TYLENOL) 500 MG tablet Take 1,000 mg by mouth every 6 (six) hours as needed for pain.    Historical Provider, MD  albuterol (VENTOLIN HFA) 108 (90 BASE)  MCG/ACT inhaler Inhale 2 puffs into the lungs every 6 (six) hours as needed for wheezing or shortness of breath. 04/11/15   Tammy S Parrett, NP  budesonide-formoterol (SYMBICORT) 160-4.5 MCG/ACT inhaler Take 2 puffs first thing in am and then another 2 puffs about 12 hours later. 04/04/15   Tanda Rockers, MD  CHANTIX CONTINUING MONTH PAK 1 MG tablet Take 1 tablet by mouth 2 (two) times daily. 05/09/15   Historical Provider, MD  ibuprofen (ADVIL,MOTRIN) 200 MG tablet Take 400 mg by mouth every 6 (six) hours as needed for moderate pain.     Historical Provider, MD  Insulin Human (INSULIN PUMP) SOLN Inject into the skin continuous. Humalog 100 units/ml    Historical Provider, MD  levofloxacin (LEVAQUIN) 500 MG tablet Take 1 tablet (500 mg total) by mouth daily. 08/07/15   Rigoberto Noel, MD  predniSONE (DELTASONE) 10 MG tablet Take 4 tab daily with food x 4 days, 3 tabs x 4 days, 2 tab x 4 days, 1 tab x 4 days then stop 08/07/15   Rigoberto Noel, MD  SIMVASTATIN PO Take 1 tablet by mouth at bedtime.    Historical Provider, MD  traZODone (DESYREL) 50 MG tablet Take 50 mg by mouth at bedtime.    Historical Provider, MD   Meds Ordered and Administered this Visit  Medications - No data to display  BP 144/64 mmHg  Pulse 68  Temp(Src) 97.9 F (36.6 C) (Oral)  Resp 18  SpO2 99% No data found.   Physical Exam General: Well-appearing, well-nourished male, pleasant and cooperative HEENT: TM's gray and intact; nasal turbinates non-swollen and pink without discharge; no tenderness to palpation over sinuses; oropharynx non-erythematous Mouth: Several teeth missing, poor dentition; right upper molar is tender to percussion, central crown erosion, mobile with light manipulation by tongue depressor; other teeth are not tender to percussion; No clicking, popping or temporal tenderness with opening and closing of jaw. Neck: No cervical lymphadenopathy appreciated CV: RRR, no murmurs/rubs/gallops Lungs:  CTAB, no  wheezes/rales/rhonchi Psych: Normal mood and affect  ED Course  Procedures (including critical care time)  Labs Review Labs Reviewed - No data to display  Imaging Review No results found.   Visual Acuity Review  Right Eye Distance:   Left Eye Distance:   Bilateral Distance:    Right Eye Near:   Left Eye Near:    Bilateral Near:        RX Augmentin 875 tid for 7 days. We discussed the need for follow-up with a dentist and he agreed to look for a dentist; he is considering seeing [facility].  MDM   1. Dental infection       Konrad Felix, [place] 10/05/15 1416

## 2015-10-05 NOTE — ED Notes (Signed)
C/O right upper toothache x 3 days without fever.  Has been taking Tyl & IBU.

## 2015-10-21 DIAGNOSIS — N08 Glomerular disorders in diseases classified elsewhere: Secondary | ICD-10-CM | POA: Diagnosis not present

## 2015-10-21 DIAGNOSIS — D225 Melanocytic nevi of trunk: Secondary | ICD-10-CM | POA: Diagnosis not present

## 2015-10-21 DIAGNOSIS — L570 Actinic keratosis: Secondary | ICD-10-CM | POA: Diagnosis not present

## 2015-10-21 DIAGNOSIS — D485 Neoplasm of uncertain behavior of skin: Secondary | ICD-10-CM | POA: Diagnosis not present

## 2015-10-21 DIAGNOSIS — Z6826 Body mass index (BMI) 26.0-26.9, adult: Secondary | ICD-10-CM | POA: Diagnosis not present

## 2015-10-21 DIAGNOSIS — Z1389 Encounter for screening for other disorder: Secondary | ICD-10-CM | POA: Diagnosis not present

## 2015-10-21 DIAGNOSIS — F329 Major depressive disorder, single episode, unspecified: Secondary | ICD-10-CM | POA: Diagnosis not present

## 2015-10-21 DIAGNOSIS — I2510 Atherosclerotic heart disease of native coronary artery without angina pectoris: Secondary | ICD-10-CM | POA: Diagnosis not present

## 2015-10-21 DIAGNOSIS — E784 Other hyperlipidemia: Secondary | ICD-10-CM | POA: Diagnosis not present

## 2015-10-21 DIAGNOSIS — E1029 Type 1 diabetes mellitus with other diabetic kidney complication: Secondary | ICD-10-CM | POA: Diagnosis not present

## 2015-10-21 DIAGNOSIS — D6489 Other specified anemias: Secondary | ICD-10-CM | POA: Diagnosis not present

## 2015-10-21 DIAGNOSIS — L821 Other seborrheic keratosis: Secondary | ICD-10-CM | POA: Diagnosis not present

## 2015-10-21 MED FILL — traZODone HCL 150 MG TABS: 150 | 30 days supply | Qty: 30 | Fill #0

## 2015-10-24 DIAGNOSIS — E1065 Type 1 diabetes mellitus with hyperglycemia: Secondary | ICD-10-CM | POA: Diagnosis not present

## 2015-10-24 MED FILL — CHANTIX 1 MG CONT MONTH BOX: 1 | 28 days supply | Qty: 56 | Fill #2

## 2015-10-28 DIAGNOSIS — Z4681 Encounter for fitting and adjustment of insulin pump: Secondary | ICD-10-CM | POA: Diagnosis not present

## 2015-10-28 DIAGNOSIS — Z6826 Body mass index (BMI) 26.0-26.9, adult: Secondary | ICD-10-CM | POA: Diagnosis not present

## 2015-10-28 DIAGNOSIS — N08 Glomerular disorders in diseases classified elsewhere: Secondary | ICD-10-CM | POA: Diagnosis not present

## 2015-10-28 DIAGNOSIS — I2510 Atherosclerotic heart disease of native coronary artery without angina pectoris: Secondary | ICD-10-CM | POA: Diagnosis not present

## 2015-10-28 DIAGNOSIS — E1029 Type 1 diabetes mellitus with other diabetic kidney complication: Secondary | ICD-10-CM | POA: Diagnosis not present

## 2015-10-29 DIAGNOSIS — E1065 Type 1 diabetes mellitus with hyperglycemia: Secondary | ICD-10-CM | POA: Diagnosis not present

## 2015-11-06 ENCOUNTER — Encounter: Payer: Self-pay | Admitting: Pulmonary Disease

## 2015-11-06 ENCOUNTER — Ambulatory Visit (INDEPENDENT_AMBULATORY_CARE_PROVIDER_SITE_OTHER): Payer: 59 | Admitting: Pulmonary Disease

## 2015-11-06 VITALS — BP 103/70 | HR 79 | Ht 69.0 in | Wt 176.0 lb

## 2015-11-06 DIAGNOSIS — J45909 Unspecified asthma, uncomplicated: Secondary | ICD-10-CM

## 2015-11-06 DIAGNOSIS — Z87891 Personal history of nicotine dependence: Secondary | ICD-10-CM

## 2015-11-06 DIAGNOSIS — J449 Chronic obstructive pulmonary disease, unspecified: Secondary | ICD-10-CM | POA: Diagnosis not present

## 2015-11-06 NOTE — Assessment & Plan Note (Signed)
Feel recurrent bronchitis related to volume loss in left lung and smoking Hopefully now that he has quit smoking, he should improve We discussed early signs of bronchitis-and he will call us for antibiotics should this occur

## 2015-11-06 NOTE — Assessment & Plan Note (Signed)
Counseling-he remains high risk for relapse

## 2015-11-06 NOTE — Patient Instructions (Signed)
Call us for early signs of bronchitis

## 2015-11-06 NOTE — Progress Notes (Signed)
   Subjective:    Patient ID: James [name], male    DOB: 1956-02-17, 60 y.o.   MRN: [id number]  HPI  50 yowm quit smoking 03/2015 with nl pfts 05/22/2015 off all rx C/w AB / not copd  Turns out he has chronic volume loss in his left lung on imaging, ? Congenital  He was previously followed by Dr. Joya Gaskins and Dr. Gwenette Greet  PMhx >> HLD, Anxiety/depression, Spinal stenosis, CAD, DM type I with retinopathy, CKD  11/06/2015  Chief Complaint  Patient presents with  . Follow-up    breathing doing fine. no concerns.   110m FU  He had recurrent attacks of bronchitis labelled as COPD flare Was given Levaquin and prednisone during his last visit on 07/2015-he is much improved now  He was able to quit smoking in 03/2015 with Chantix He has stayed off cigarettes   Pt has DM type 1 . Had multiple courses of prednisone    TESTS: Spirometry 2012 >> FEV1 1.94 (52%)  PFTs 05/2015 ratio 74, FEV1 72%, no BD response, mild restriction  CT sinus 08/2013 neg  Ct chest 01/2014 - left volume loss ? congenital    Review of Systems Patient denies significant dyspnea,cough, hemoptysis,  chest pain, palpitations, pedal edema, orthopnea, paroxysmal nocturnal dyspnea, lightheadedness, nausea, vomiting, abdominal or  leg pains      Objective:   Physical Exam  Gen. Pleasant, well-nourished, in no distress ENT - no lesions, no post nasal drip Neck: No JVD, no thyromegaly, no carotid bruits Lungs: no use of accessory muscles, no dullness to percussion, clear without rales or rhonchi  Cardiovascular: Rhythm regular, heart sounds  normal, no murmurs or gallops, no peripheral edema Musculoskeletal: No deformities, no cyanosis or clubbing        Assessment & Plan:

## 2015-11-07 MED FILL — HumaLOG 100 UNIT/ML SOLN: 100 | 40 days supply | Qty: 40 | Fill #5

## 2015-11-07 MED FILL — CONTOUR NEXT STRIPS: 37 days supply | Qty: 300 | Fill #4

## 2015-11-18 DIAGNOSIS — D485 Neoplasm of uncertain behavior of skin: Secondary | ICD-10-CM | POA: Diagnosis not present

## 2015-11-18 DIAGNOSIS — D225 Melanocytic nevi of trunk: Secondary | ICD-10-CM | POA: Diagnosis not present

## 2015-11-18 MED FILL — MUPIROCIN 2% OINTMENT: 2 | 10 days supply | Qty: 22 | Fill #0

## 2015-11-24 MED FILL — traZODone HCL 150 MG TABS: 150 | 30 days supply | Qty: 30 | Fill #1

## 2015-11-24 MED FILL — SIMVASTATIN 40 MG TABLET: 40 | 30 days supply | Qty: 30 | Fill #0

## 2015-11-25 MED FILL — SYMBICORT 160-4.5 MCG INH: 160-4.5 | 30 days supply | Qty: 10 | Fill #3

## 2015-12-03 MED FILL — CHANTIX 1 MG CONT MONTH BOX: 1 | 28 days supply | Qty: 56 | Fill #3

## 2015-12-10 ENCOUNTER — Telehealth: Payer: Self-pay | Admitting: Pulmonary Disease

## 2015-12-10 NOTE — Telephone Encounter (Signed)
Notes from November indicate a similar problem that did not respond to multiple abx so this time we'll need to see him before committing to rx that didn't work the last time  Needs w/in with NP by weekend and in meantime for cough mucinex dm 1200 mg every 12 hours and advil cold and sinus for stuffy nose the best we can do

## 2015-12-10 NOTE — Telephone Encounter (Signed)
Spoke with patient-he has been added on to TP's schedule in [facility] office at 11:15am. Nothing more needed at this time.

## 2015-12-10 NOTE — Telephone Encounter (Signed)
Patient states that he has sore throat, stuffy nose, non-productive cough, wheezing.  Allergies  Allergen Reactions  . Iohexol      Code: HIVES, Desc: PATIENT STATES HE IS ALLERGIC TO IV DYE, nervousness, dizziness, light sweat   . Lexapro [Escitalopram Oxalate] Itching       Patient Instructions     Call us for early signs of bronchitis

## 2015-12-11 ENCOUNTER — Encounter: Payer: Self-pay | Admitting: Adult Health

## 2015-12-11 ENCOUNTER — Ambulatory Visit (INDEPENDENT_AMBULATORY_CARE_PROVIDER_SITE_OTHER): Payer: 59 | Admitting: Adult Health

## 2015-12-11 VITALS — BP 131/69 | HR 73 | Temp 97.8°F | Ht 69.0 in | Wt 179.0 lb

## 2015-12-11 DIAGNOSIS — J45909 Unspecified asthma, uncomplicated: Secondary | ICD-10-CM | POA: Diagnosis not present

## 2015-12-11 DIAGNOSIS — J449 Chronic obstructive pulmonary disease, unspecified: Secondary | ICD-10-CM | POA: Diagnosis not present

## 2015-12-11 MED ORDER — AMOXICILLIN-POT CLAVULANATE 875-125 MG PO TABS: 1.0000 | ORAL_TABLET | Freq: Two times a day (BID) | ORAL | Status: AC

## 2015-12-11 MED ORDER — PREDNISONE 10 MG PO TABS: ORAL_TABLET | ORAL | Status: DC

## 2015-12-11 MED ORDER — HYDROCODONE-HOMATROPINE 5-1.5 MG/5ML PO SYRP: 5.0000 mL | ORAL_SOLUTION | Freq: Four times a day (QID) | ORAL | Status: DC | PRN

## 2015-12-11 MED FILL — HYDROCODONE-HOMATROPINE SYR: 5-1.5 | 12 days supply | Qty: 240 | Fill #0

## 2015-12-11 MED FILL — predniSONE 10 MG TABS: 10 | 8 days supply | Qty: 20 | Fill #0

## 2015-12-11 MED FILL — AMOX-CLAV 875-125 MG TABLET: 875-125 | 7 days supply | Qty: 14 | Fill #0

## 2015-12-11 NOTE — Assessment & Plan Note (Signed)
Flare with URI  Steroid education begin , pt has SSI (bolus) if BS increase on steroids  Plan  Augmentin 875mg  Twice daily for 1 week - to have on hold if symptoms worsen with discolored mucus.  Mucinex DM Twice daily  As needed  Cough/congestion  . Prednisone taper over next week.  Hydromet 1 tsp every 6hr as needed for cough, may make you sleepy  Please contact office for sooner follow up if symptoms do not improve or worsen or seek emergency care  Follow up Dr. Elsworth Soho  In 3 months and As needed

## 2015-12-11 NOTE — Progress Notes (Signed)
Subjective:    Patient ID: James [name], male    DOB: 06/13/1956, 60 y.o.   MRN: [id number]  HPI 60 yo former smoker with COPD   TESTS: Spirometry 2012 >> FEV1 1.94 (52%) PFTs 05/2015 ratio 74, FEV1 72%, no BD response, mild restriction  CT sinus 08/2013 neg  Ct chest 01/2014 - left volume loss ? congenital  PMhx >> HLD, Anxiety/depression, Spinal stenosis, CAD, DM type I with retinopathy, CKD    12/11/2015 Acute OV : COPD  Pt presents for an acute office visit.  Complains of 1 week of cough, congestion , sore throat , nasal drainage and wheezing .  He denies fever,  Discolored mucus, chest pain, orthopnea or edema. No recent travel or abx.  Wife sick with similar symptoms.  Remains on Symbicort Twice daily  .  Took some sudafed , advil and zyrtec.  No recent travel or abx use.     Past Medical History  Diagnosis Date  . HYPERLIPIDEMIA 04/04/2007  . ANXIETY 04/03/2007  . DEPRESSION 04/03/2007  . ASTHMATIC BRONCHITIS, ACUTE 10/25/2008  . ASTHMA 09/06/2008  . ED (erectile dysfunction)   . Cervical disc disease   . Bladder neck obstruction   . COPD (chronic obstructive pulmonary disease) ([place])   . Spinal stenosis   . Depression   . Anxiety   . CORONARY ARTERY DISEASE 04/03/2007  . CARPAL TUNNEL SYNDROME, BILATERAL 07/31/2007    issues resolved, no surgery  . DIABETES MELLITUS, TYPE I 04/03/2007  . Diabetic retinopathy associated with diabetes mellitus due to underlying condition ([place]) 04/03/2007  . DM W/EYE MANIFESTATIONS, TYPE I, UNCONTROLLED 04/04/2007  . Pneumonia     "several times and again today" (02/13/2104)  . Chronic bronchitis ([place])     "get it about q yr" (02/12/2014)  . Seizures ([place])     "insulin seizure from time to time; none in the last couple years" (02/12/2014)  . Renal insufficiency   . DM W/RENAL MNFST, TYPE I, UNCONTROLLED 04/04/2007   Current Outpatient Prescriptions on File Prior to Visit  Medication Sig Dispense Refill  . acetaminophen (TYLENOL) 500 MG  tablet Take 1,000 mg by mouth every 6 (six) hours as needed for pain.    [name] albuterol (VENTOLIN HFA) 108 (90 BASE) MCG/ACT inhaler Inhale 2 puffs into the lungs every 6 (six) hours as needed for wheezing or shortness of breath. 1 Inhaler 5  . budesonide-formoterol (SYMBICORT) 160-4.5 MCG/ACT inhaler Take 2 puffs first thing in am and then another 2 puffs about 12 hours later. 1 Inhaler 12  . CHANTIX CONTINUING MONTH PAK 1 MG tablet Take 1 tablet by mouth 2 (two) times daily.  2  . ibuprofen (ADVIL,MOTRIN) 200 MG tablet Take 400 mg by mouth every 6 (six) hours as needed for moderate pain.     . Insulin Human (INSULIN PUMP) SOLN Inject into the skin continuous. Humalog 100 units/ml    . loratadine (CLARITIN) 10 MG tablet Take 10 mg by mouth daily.    [name] SIMVASTATIN PO Take 1 tablet by mouth at bedtime. 40mg  Tablet    . traZODone (DESYREL) 50 MG tablet Take 50 mg by mouth at bedtime.     No current facility-administered medications on file prior to visit.      Review of Systems  Constitutional:   No  weight loss, night sweats,  Fevers, chills, + fatigue, or  lassitude.  HEENT:   No headaches,  Difficulty swallowing,  Tooth/dental problems, or  Sore throat,  No sneezing, itching, ear ache,  +nasal congestion, post nasal drip,   CV:  No chest pain,  Orthopnea, PND, swelling in lower extremities, anasarca, dizziness, palpitations, syncope.   GI  No heartburn, indigestion, abdominal pain, nausea, vomiting, diarrhea, change in bowel habits, loss of appetite, bloody stools.   Resp:   No chest wall deformity  Skin: no rash or lesions.  GU: no dysuria, change in color of urine, no urgency or frequency.  No flank pain, no hematuria   MS:  No joint pain or swelling.  No decreased range of motion.  No back pain.  Psych:  No change in mood or affect. No depression or anxiety.  No memory loss.          Objective:   Physical Exam   Filed Vitals:   12/11/15 1137  BP: 131/69    Pulse: 73  Temp: 97.8 F (36.6 C)  TempSrc: Oral  Height: 5\' 9"  (1.753 m)  Weight: 179 lb (81.194 kg)  SpO2: 97%     GEN: A/Ox3; pleasant , NAD Vitals signs reviewed   HEENT:  [place]/AT,  EACs-clear, TMs-wnl, NOSE-clear, THROAT-clear, no lesions, no postnasal drip or exudate noted.   NECK:  Supple w/ fair ROM; no JVD; normal carotid impulses w/o bruits; no thyromegaly or nodules palpated; no lymphadenopathy.  RESP  Few trace rhonchi .no accessory muscle use, no dullness to percussion  CARD:  RRR, no m/r/g  , no peripheral edema, pulses intact, no cyanosis or clubbing.  GI:   Soft & nt; nml bowel sounds; no organomegaly or masses detected.  Musco: Warm bil, no deformities or joint swelling noted.   Neuro: alert, no focal deficits noted.    Skin: Warm, no lesions or rashes   Tammy Parrett NP-C  [facility]  12/11/2015      Assessment & Plan:

## 2015-12-11 NOTE — Progress Notes (Signed)
Reviewed & agree with plan  

## 2015-12-11 NOTE — Patient Instructions (Addendum)
Augmentin 875mg  Twice daily for 1 week - to have on hold if symptoms worsen with discolored mucus.  Mucinex DM Twice daily  As needed  Cough/congestion  . Prednisone taper over next week.  Hydromet 1 tsp every 6hr as needed for cough, may make you sleepy  Please contact office for sooner follow up if symptoms do not improve or worsen or seek emergency care  Follow up Dr. Elsworth Soho  In 3 months and As needed

## 2015-12-12 ENCOUNTER — Telehealth: Payer: Self-pay | Admitting: Adult Health

## 2015-12-12 MED ORDER — PROMETHAZINE HCL 25 MG PO TABS: 25.0000 mg | ORAL_TABLET | Freq: Four times a day (QID) | ORAL | Status: DC | PRN

## 2015-12-12 MED FILL — PROMETHAZINE 25 MG TABLET: 25 | 5 days supply | Qty: 20 | Fill #0

## 2015-12-12 NOTE — Telephone Encounter (Signed)
lmtcb x1 for pt. 

## 2015-12-12 NOTE — Telephone Encounter (Signed)
Rx was sent  [facility] informing pt and asked that he call if not improving or seek emergent Will call next wk to be sure he received the [facility] [address]

## 2015-12-12 NOTE — Telephone Encounter (Signed)
Spoke with pt's wife. Pt is having spouts of nausea. She does not know if the meds that were given to him yesterday are causing this. States that Zofran does not work for him. They would like to have something sent in.  TP - please advise. Thanks.

## 2015-12-12 NOTE — Telephone Encounter (Signed)
Phenergan 25mg  #20 1 every 6hr as needed for nausea  Please contact office for sooner follow up if symptoms do not improve or worsen or seek emergency care  No refills

## 2015-12-17 NOTE — Telephone Encounter (Signed)
LM x 2

## 2015-12-18 NOTE — Telephone Encounter (Signed)
lmtcb X3 for pt.   Will close message, requested rx has already been sent to pharmacy.

## 2015-12-19 ENCOUNTER — Telehealth: Payer: Self-pay | Admitting: Pulmonary Disease

## 2015-12-19 MED ORDER — PREDNISONE 10 MG PO TABS: ORAL_TABLET | ORAL | Status: DC

## 2015-12-19 MED FILL — predniSONE 10 MG TABS: 10 | 6 days supply | Qty: 14 | Fill #0

## 2015-12-19 NOTE — Telephone Encounter (Signed)
Prednisone 10 mg take  4 each am x 2 days,   2 each am x 2 days,  1 each am x 2 days and stop  Will need re-eval next week add on to my schedule if no one else can see him - bring all meds

## 2015-12-19 NOTE — Telephone Encounter (Signed)
Spoke with pt's wife and she states that pt does not seem to be improving. Pt is stating that he feels worse than when he saw TP last week. He has been taking abx and oral pred as directed. He did have some nausea but that has subsided. He c/o cough with white/tan mucus, wheeze, SOB and chest tightness. Pt has also been taking Hydromet and using albuterol HFA with some symptom relief. Pt's wife states that RA had mentioned to them that due to pt's condition, when he has a flare he likely will need a stronger/longer pred taper than usual. Pt's wife is requesting more prednisone. Pt does have appt with RA on Monday.  RA is on vacation.  MW please advise.   LOV  12/11/15  Patient Instructions     Augmentin 875mg  Twice daily for 1 week - to have on hold if symptoms worsen with discolored mucus.  Mucinex DM Twice daily As needed Cough/congestion . Prednisone taper over next week.  Hydromet 1 tsp every 6hr as needed for cough, may make you sleepy  Please contact office for sooner follow up if symptoms do not improve or worsen or seek emergency care  Follow up Dr. Elsworth Soho In 3 months and As needed

## 2015-12-19 NOTE — Telephone Encounter (Signed)
Spoke with pt and gave recommendations. Pt has appt with RA on Monday. Rx sent. Nothing further needed.

## 2015-12-22 ENCOUNTER — Other Ambulatory Visit: Payer: 59

## 2015-12-22 ENCOUNTER — Encounter: Payer: Self-pay | Admitting: Pulmonary Disease

## 2015-12-22 ENCOUNTER — Encounter (HOSPITAL_COMMUNITY): Payer: Self-pay | Admitting: *Deleted

## 2015-12-22 ENCOUNTER — Ambulatory Visit (INDEPENDENT_AMBULATORY_CARE_PROVIDER_SITE_OTHER)
Admission: RE | Admit: 2015-12-22 | Discharge: 2015-12-22 | Disposition: A | Discharge status: Home / Self Care | Payer: 59 | Source: Ambulatory Visit | Attending: Pulmonary Disease | Admitting: Pulmonary Disease

## 2015-12-22 ENCOUNTER — Observation Stay (HOSPITAL_COMMUNITY)
Admission: EM | Admit: 2015-12-22 | Discharge: 2015-12-23 | Disposition: A | Discharge status: Home / Self Care | Payer: 59 | Attending: Internal Medicine | Admitting: Internal Medicine

## 2015-12-22 ENCOUNTER — Ambulatory Visit (INDEPENDENT_AMBULATORY_CARE_PROVIDER_SITE_OTHER): Payer: 59 | Admitting: Pulmonary Disease

## 2015-12-22 VITALS — BP 150/82 | HR 78 | Temp 97.4°F | Ht 69.0 in | Wt 179.0 lb

## 2015-12-22 DIAGNOSIS — F329 Major depressive disorder, single episode, unspecified: Secondary | ICD-10-CM | POA: Insufficient documentation

## 2015-12-22 DIAGNOSIS — R069 Unspecified abnormalities of breathing: Secondary | ICD-10-CM | POA: Diagnosis not present

## 2015-12-22 DIAGNOSIS — E119 Type 2 diabetes mellitus without complications: Secondary | ICD-10-CM | POA: Diagnosis not present

## 2015-12-22 DIAGNOSIS — J45909 Unspecified asthma, uncomplicated: Secondary | ICD-10-CM | POA: Insufficient documentation

## 2015-12-22 DIAGNOSIS — J189 Pneumonia, unspecified organism: Secondary | ICD-10-CM | POA: Insufficient documentation

## 2015-12-22 DIAGNOSIS — E785 Hyperlipidemia, unspecified: Secondary | ICD-10-CM | POA: Diagnosis present

## 2015-12-22 DIAGNOSIS — J449 Chronic obstructive pulmonary disease, unspecified: Secondary | ICD-10-CM | POA: Insufficient documentation

## 2015-12-22 DIAGNOSIS — E1029 Type 1 diabetes mellitus with other diabetic kidney complication: Secondary | ICD-10-CM | POA: Insufficient documentation

## 2015-12-22 DIAGNOSIS — R059 Cough, unspecified: Secondary | ICD-10-CM

## 2015-12-22 DIAGNOSIS — E1039 Type 1 diabetes mellitus with other diabetic ophthalmic complication: Secondary | ICD-10-CM | POA: Insufficient documentation

## 2015-12-22 DIAGNOSIS — J4489 Other specified chronic obstructive pulmonary disease: Secondary | ICD-10-CM | POA: Diagnosis present

## 2015-12-22 DIAGNOSIS — R05 Cough: Secondary | ICD-10-CM

## 2015-12-22 DIAGNOSIS — Z794 Long term (current) use of insulin: Secondary | ICD-10-CM | POA: Insufficient documentation

## 2015-12-22 DIAGNOSIS — I2510 Atherosclerotic heart disease of native coronary artery without angina pectoris: Secondary | ICD-10-CM | POA: Diagnosis present

## 2015-12-22 DIAGNOSIS — F418 Other specified anxiety disorders: Secondary | ICD-10-CM | POA: Diagnosis not present

## 2015-12-22 DIAGNOSIS — Z87891 Personal history of nicotine dependence: Secondary | ICD-10-CM | POA: Diagnosis not present

## 2015-12-22 DIAGNOSIS — K219 Gastro-esophageal reflux disease without esophagitis: Secondary | ICD-10-CM | POA: Diagnosis present

## 2015-12-22 DIAGNOSIS — R0600 Dyspnea, unspecified: Secondary | ICD-10-CM | POA: Diagnosis present

## 2015-12-22 LAB — CBC
HEMATOCRIT: 39.2 % (ref 39.0–52.0)
HEMOGLOBIN: 14.4 g/dL (ref 13.0–17.0)
MCH: 32.5 pg (ref 26.0–34.0)
MCHC: 36.7 g/dL — AB (ref 30.0–36.0)
MCV: 88.5 fL (ref 78.0–100.0)
Platelets: 227 10*3/uL (ref 150–400)
RBC: 4.43 MIL/uL (ref 4.22–5.81)
RDW: 12.4 % (ref 11.5–15.5)
WBC: 15 10*3/uL — ABNORMAL HIGH (ref 4.0–10.5)

## 2015-12-22 LAB — COMPREHENSIVE METABOLIC PANEL
ALT: 24 U/L (ref 17–63)
AST: 20 U/L (ref 15–41)
Albumin: 4.2 g/dL (ref 3.5–5.0)
Alkaline Phosphatase: 75 U/L (ref 38–126)
Anion gap: 9 (ref 5–15)
BUN: 15 mg/dL (ref 6–20)
CALCIUM: 8.5 mg/dL — AB (ref 8.9–10.3)
CO2: 22 mmol/L (ref 22–32)
CREATININE: 0.99 mg/dL (ref 0.61–1.24)
Chloride: 106 mmol/L (ref 101–111)
Glucose, Bld: 137 mg/dL — ABNORMAL HIGH (ref 65–99)
Potassium: 3.5 mmol/L (ref 3.5–5.1)
Sodium: 137 mmol/L (ref 135–145)
Total Bilirubin: 1.5 mg/dL — ABNORMAL HIGH (ref 0.3–1.2)
Total Protein: 6.4 g/dL — ABNORMAL LOW (ref 6.5–8.1)

## 2015-12-22 LAB — GLUCOSE, CAPILLARY
GLUCOSE-CAPILLARY: 331 mg/dL — AB (ref 65–99)
Glucose-Capillary: 214 mg/dL — ABNORMAL HIGH (ref 65–99)

## 2015-12-22 LAB — I-STAT CG4 LACTIC ACID, ED: LACTIC ACID, VENOUS: 1.74 mmol/L (ref 0.5–2.0)

## 2015-12-22 LAB — PROCALCITONIN: Procalcitonin: 0.14 ng/mL

## 2015-12-22 MED ORDER — TRAZODONE HCL 50 MG PO TABS
150.0000 mg | ORAL_TABLET | Freq: Every day | ORAL | Status: DC
  Administered 2015-12-23: 150 mg via ORAL
  Filled 2015-12-22: qty 3

## 2015-12-22 MED ORDER — DEXTROSE 5 % IV SOLN
1.0000 g | INTRAVENOUS | Status: DC
  Filled 2015-12-22: qty 10

## 2015-12-22 MED ORDER — SIMVASTATIN 40 MG PO TABS
40.0000 mg | ORAL_TABLET | Freq: Every day | ORAL | Status: DC
  Administered 2015-12-23: 40 mg via ORAL
  Filled 2015-12-22: qty 1

## 2015-12-22 MED ORDER — ENOXAPARIN SODIUM 40 MG/0.4ML ~~LOC~~ SOLN
40.0000 mg | SUBCUTANEOUS | Status: DC
  Administered 2015-12-23: 40 mg via SUBCUTANEOUS
  Filled 2015-12-22 (×2): qty 0.4

## 2015-12-22 MED ORDER — POTASSIUM CHLORIDE IN NACL 20-0.9 MEQ/L-% IV SOLN
INTRAVENOUS | Status: AC
  Administered 2015-12-23: 1000 mL via INTRAVENOUS
  Filled 2015-12-22: qty 1000

## 2015-12-22 MED ORDER — AZITHROMYCIN 500 MG IV SOLR
500.0000 mg | Freq: Once | INTRAVENOUS | Status: DC
  Filled 2015-12-22: qty 500

## 2015-12-22 MED ORDER — DEXTROSE 5 % IV SOLN
500.0000 mg | INTRAVENOUS | Status: DC
  Filled 2015-12-22: qty 500

## 2015-12-22 MED ORDER — INSULIN PUMP
Freq: Three times a day (TID) | SUBCUTANEOUS | Status: DC
  Administered 2015-12-23: 7 via SUBCUTANEOUS
  Administered 2015-12-23: 3 via SUBCUTANEOUS
  Filled 2015-12-22: qty 1

## 2015-12-22 MED ORDER — LEVOFLOXACIN 750 MG PO TABS: 750.0000 mg | ORAL_TABLET | Freq: Every day | ORAL | Status: DC

## 2015-12-22 MED ORDER — MOMETASONE FURO-FORMOTEROL FUM 200-5 MCG/ACT IN AERO
2.0000 | INHALATION_SPRAY | Freq: Two times a day (BID) | RESPIRATORY_TRACT | Status: DC
  Filled 2015-12-22 (×2): qty 8.8

## 2015-12-22 MED ORDER — INSULIN PUMP
SUBCUTANEOUS | Status: DC
  Filled 2015-12-22: qty 1

## 2015-12-22 MED ORDER — IPRATROPIUM-ALBUTEROL 0.5-2.5 (3) MG/3ML IN SOLN: 3.0000 mL | RESPIRATORY_TRACT | Status: DC | PRN

## 2015-12-22 MED ORDER — PROMETHAZINE HCL 25 MG PO TABS: 25.0000 mg | ORAL_TABLET | Freq: Four times a day (QID) | ORAL | Status: DC | PRN

## 2015-12-22 MED ORDER — METHYLPREDNISOLONE SODIUM SUCC 125 MG IJ SOLR
60.0000 mg | Freq: Four times a day (QID) | INTRAMUSCULAR | Status: DC
  Administered 2015-12-23 (×3): 60 mg via INTRAVENOUS
  Filled 2015-12-22 (×4): qty 2

## 2015-12-22 MED ORDER — HYDROCOD POLST-CPM POLST ER 10-8 MG/5ML PO SUER
5.0000 mL | Freq: Once | ORAL | Status: DC
  Filled 2015-12-22: qty 5

## 2015-12-22 MED ORDER — DEXTROSE 5 % IV SOLN
1.0000 g | Freq: Once | INTRAVENOUS | Status: AC
  Administered 2015-12-22: 1 g via INTRAVENOUS
  Filled 2015-12-22: qty 10

## 2015-12-22 MED FILL — levoFLOXacin 750 MG TABS: 750 | 7 days supply | Qty: 7 | Fill #0

## 2015-12-22 NOTE — Patient Instructions (Addendum)
Levaquin 750 mg daily 7 days Take course of prednisone Follow-up chest x-ray in one week Call us if no better by mid week  Use Mucinex 600 mg twice daily

## 2015-12-22 NOTE — Progress Notes (Signed)
   Subjective:    Patient ID: James [name], male    DOB: 06/16/1956, 60 y.o.   MRN: [id number]  HPI   1 yowm quit smoking 03/2015 with nl pfts 05/22/2015 off all rx C/w AB / not copd  Turns out he has chronic volume loss in his left lung on imaging, ? Congenital   PMhx >> HLD, Anxiety/depression, Spinal stenosis, CAD, DM type I with retinopathy, CKD  12/22/2015  Chief Complaint  Patient presents with  . Acute Visit    pt c/o increased sob, non prod cough, wheezing, cp, chillsX2w    He was seen on 5/25 as an acute visit with 1 week of cough congestion and nasal drainage and wheezing He was given Augmentin and prednisone-he called over the weekend again since he was no better and was given another course of prednisone He admits that he did not complete the course of Augmentin since he has GI side effects He now reports a dry cough, feels chills without recorded fevers and has dyspnea and wheezing  Chest x-ray reported as right lower lobe infiltrate but on my review of imaging not so convincing  Significant tests/ events  Spirometry 2012 >> FEV1 1.94 (52%) PFTs 05/2015 ratio 74, FEV1 72%, no BD response, mild restriction  CT sinus 08/2013 neg  Ct chest 01/2014 - left volume loss ? congenital     Review of Systems neg for any significant sore throat, dysphagia, itching, sneezing, nasal congestion or excess/ purulent secretions, fever, sweats, unintended wt loss, pleuritic or exertional cp, hempoptysis, orthopnea pnd or change in chronic leg swelling.   Also denies presyncope, palpitations, heartburn, abdominal pain, nausea, vomiting, diarrhea or change in bowel or urinary habits, dysuria,hematuria, rash, arthralgias, visual complaints, headache, numbness weakness or ataxia.     Objective:   Physical Exam   Gen. Pleasant, well-nourished, in mild distress, feels chilly ENT - no lesions, no post nasal drip Neck: No JVD, no thyromegaly, no carotid bruits Lungs: no use of  accessory muscles, no dullness to percussion,  Rales On left and right base, scattered rhonchi on right Cardiovascular: Rhythm regular, heart sounds  normal, no murmurs or gallops, no peripheral edema Musculoskeletal: No deformities, no cyanosis or clubbing         Assessment & Plan:

## 2015-12-22 NOTE — H&P (Signed)
History and Physical    Bernadette Tirella Hendon [id number] DOB: 07-Aug-1955 DOA: 12/22/2015  PCP: Sheela Stack, MD   Patient coming from: Home  Chief Complaint: SOB, fever, malaise  HPI: James [name] is a 60 y.o. male with medical history significant for chronic bronchitis, type 1 diabetes mellitus on insulin pump, coronary artery disease, depression, and anxiety presents to the ED with approximately 10 days of dyspnea, productive cough, and malaise. Patient reports a history of chronic bronchitis and remote tobacco abuse who was in his usual state of health until the insidious development of worsening dyspnea and cough productive of thick white sputum approximately 10 days ago. He was evaluated by his pulmonologist at that time and treated with a course of Augmentin and prednisone. Unfortunately, the patient was unable to tolerate the GI side effects from Augmentin and did not complete the course. Patient called in to his pulmonology office 2 days ago to report that he had not gotten any better and today second course of prednisone was prescribed. He was evaluated in the pulmonology clinic earlier today with chest x-ray suggestive of a right lower lobe pneumonia. He was given a course of Levaquin, but unfortunately, upon returning home, his condition worsened and he activated EMS for severe dyspnea. Patient was given DuoNeb 2 en route to the hospital and 125 mg IV Solu-Medrol was also administered by EMS.  ED Course: Upon arrival to the ED, patient is found to be febrile to 38 C, saturating adequately on room air, tachycardic in the 110s and with stable blood pressure. EKG features a sinus rhythm with right axis deviation. Chemistry panel is notable for a mild hyperbilirubinemia and mild hypocalcemia. CBC features a leukocytosis to 15,000. Lactic acid is reassuring at 1.74.  Blood cultures were obtained in the emergency department and the patient was treated with empiric Rocephin and azithromycin. He  remained hemodynamically stable in the emergency department and will be admitted to the hospital for ongoing evaluation and management of CPAP.  Review of Systems:  All other systems reviewed and apart from HPI, are negative.  Past Medical History  Diagnosis Date  . HYPERLIPIDEMIA 04/04/2007  . ANXIETY 04/03/2007  . DEPRESSION 04/03/2007  . ASTHMATIC BRONCHITIS, ACUTE 10/25/2008  . ASTHMA 09/06/2008  . ED (erectile dysfunction)   . Cervical disc disease   . Bladder neck obstruction   . COPD (chronic obstructive pulmonary disease) ([place])   . Spinal stenosis   . Depression   . Anxiety   . CORONARY ARTERY DISEASE 04/03/2007  . CARPAL TUNNEL SYNDROME, BILATERAL 07/31/2007    issues resolved, no surgery  . DIABETES MELLITUS, TYPE I 04/03/2007  . Diabetic retinopathy associated with diabetes mellitus due to underlying condition ([place]) 04/03/2007  . DM W/EYE MANIFESTATIONS, TYPE I, UNCONTROLLED 04/04/2007  . Pneumonia     "several times and again today" (02/13/2104)  . Chronic bronchitis ([place])     "get it about q yr" (02/12/2014)  . Seizures ([place])     "insulin seizure from time to time; none in the last couple years" (02/12/2014)  . Renal insufficiency   . DM W/RENAL MNFST, TYPE I, UNCONTROLLED 04/04/2007    Past Surgical History  Procedure Laterality Date  . Anterior cervical decomp/discectomy fusion  2000    "couple screws and a plate"  . Appendectomy    . Stress cardiolite  09/06/2002  . Tonsillectomy    . Lymph node dissection  ~ 1960    groin  . Cystoscopy with retrograde pyelogram, ureteroscopy  and stent placement Bilateral 04/06/2013    Procedure: BILATERAL CYSTOSCOPY WITH RETROGRADE PYELOGRAMS, STENT PLACEMENTS AND LEFT URETEROSCOPY AND STONE REMOVAL;  Surgeon: Alexis Frock, MD;  Location: [facility];  Service: Urology;  Laterality: Bilateral;  . Holmium laser application Left [id number]    Procedure: HOLMIUM LASER APPLICATION;  Surgeon: Alexis Frock, MD;  Location: [facility];  Service:  Urology;  Laterality: Left;  . Cystoscopy/retrograde/ureteroscopy Bilateral 04/12/2013    Procedure: CYSTOSCOPY/RETROGRADE/URETEROSCOPY;  Surgeon: Alexis Frock, MD;  Location: [facility];  Service: Urology;  Laterality: Bilateral;  RIGHT RETROGRADE   . Cystoscopy with stent placement Right 04/12/2013    Procedure: CYSTOSCOPY WITH STENT PLACEMENT;  Surgeon: Alexis Frock, MD;  Location: [facility];  Service: Urology;  Laterality: Right;  . Back surgery    . Cardiac catheterization  1990's  . Vitrectomy Bilateral   . Cataract extraction w/ intraocular lens  implant, bilateral Bilateral      reports that he quit smoking about 9 months ago. His smoking use included Cigarettes. He has a 41 pack-year smoking history. He has never used smokeless tobacco. He reports that he does not drink alcohol or use illicit drugs.  Allergies  Allergen Reactions  . Iohexol      Code: HIVES, Desc: PATIENT STATES HE IS ALLERGIC TO IV DYE, nervousness, dizziness, light sweat   . Lexapro [Escitalopram Oxalate] Itching    Family History  Problem Relation Age of Onset  . Stroke Father     strong FH cerebrovascular disease  . Diabetes Brother   . Heart disease Brother     CHF  . Colon cancer Neg Hx   . Cancer Mother      Prior to Admission medications   Medication Sig Start Date End Date Taking? Authorizing Provider  albuterol (VENTOLIN HFA) 108 (90 BASE) MCG/ACT inhaler Inhale 2 puffs into the lungs every 6 (six) hours as needed for wheezing or shortness of breath. 04/11/15  Yes Tammy S Parrett, NP  budesonide-formoterol (SYMBICORT) 160-4.5 MCG/ACT inhaler Take 2 puffs first thing in am and then another 2 puffs about 12 hours later. Patient taking differently: Inhale 2 puffs into the lungs every 12 (twelve) hours.  04/04/15  Yes Tanda Rockers, MD  CHANTIX CONTINUING MONTH PAK 1 MG tablet Take 1 mg by mouth 2 (two) times daily.  05/09/15  Yes Historical Provider, MD  ibuprofen (ADVIL,MOTRIN) 200 MG tablet Take  400 mg by mouth every 6 (six) hours as needed for moderate pain.    Yes Historical Provider, MD  Insulin Human (INSULIN PUMP) SOLN Inject into the skin continuous. Humalog 100 units/ml   Yes Historical Provider, MD  promethazine (PHENERGAN) 25 MG tablet Take 1 tablet (25 mg total) by mouth every 6 (six) hours as needed for nausea or vomiting. 12/12/15  Yes Tammy S Parrett, NP  simvastatin (ZOCOR) 40 MG tablet Take 40 mg by mouth daily. 11/24/15  Yes Historical Provider, MD  traZODone (DESYREL) 150 MG tablet Take 150 mg by mouth at bedtime. 11/24/15  Yes Historical Provider, MD  HYDROcodone-homatropine (HYDROMET) 5-1.5 MG/5ML syrup Take 5 mLs by mouth every 6 (six) hours as needed. Patient not taking: Reported on 12/22/2015 12/11/15   Fonnie Mu Parrett, NP  levofloxacin (LEVAQUIN) 750 MG tablet Take 1 tablet (750 mg total) by mouth daily. Patient not taking: Reported on 12/22/2015 12/22/15   Rigoberto Noel, MD    Physical Exam: Filed Vitals:   12/22/15 1846 12/22/15 1930 12/22/15 2000 12/22/15 2030  BP:  111/59 126/62  122/62  Pulse:  110 101 95  Temp:      TempSrc:      Resp:  23 22 25   Height:      Weight:      SpO2: 96% 96% 95% 95%      Constitutional: Mild respiratory distress with accessory muscle use, appears stated age Eyes: PERTLA, lids and conjunctivae normal ENMT: Mucous membranes are moist. Posterior pharynx clear of any exudate or lesions.   Neck: normal, supple, no masses, no thyromegaly Respiratory: Rhonchi at right base. Expiratory wheezes. Accessory muscle use. No pallor or cyanosis.  Cardiovascular: S1 & S2 heard, regular rate and rhythm, no significant murmurs / rubs / gallops. No significant JVD. Abdomen: No distension, no tenderness, no masses palpated. Bowel sounds normal. Insulin pump noted Musculoskeletal: no clubbing / cyanosis. No joint deformity upper and lower extremities. Normal muscle tone.  Skin: no significant rashes, lesions, ulcers. Warm, dry,  well-perfused. Neurologic: CN 2-12 grossly intact. Sensation intact, DTR normal. Strength 5/5 in all 4 limbs.  Psychiatric: Normal judgment and insight. Alert and oriented x 3. Normal mood and affect.     Labs on Admission: I have personally reviewed following labs and imaging studies  CBC:  Recent Labs Lab 12/22/15 1907  WBC 15.0*  HGB 14.4  HCT 39.2  MCV 88.5  PLT [id number]   Basic Metabolic Panel:  Recent Labs Lab 12/22/15 1907  NA 137  K 3.5  CL 106  CO2 22  GLUCOSE 137*  BUN 15  CREATININE 0.99  CALCIUM 8.5*   GFR: Estimated Creatinine Clearance: 79.3 mL/min (by C-G formula based on Cr of 0.99). Liver Function Tests:  Recent Labs Lab 12/22/15 1907  AST 20  ALT 24  ALKPHOS 75  BILITOT 1.5*  PROT 6.4*  ALBUMIN 4.2   No results for input(s): LIPASE, AMYLASE in the last 168 hours. No results for input(s): AMMONIA in the last 168 hours. Coagulation Profile: No results for input(s): INR, PROTIME in the last 168 hours. Cardiac Enzymes: No results for input(s): CKTOTAL, CKMB, CKMBINDEX, TROPONINI in the last 168 hours. BNP (last 3 results) No results for input(s): PROBNP in the last 8760 hours. HbA1C: No results for input(s): HGBA1C in the last 72 hours. CBG: No results for input(s): GLUCAP in the last 168 hours. Lipid Profile: No results for input(s): CHOL, HDL, LDLCALC, TRIG, CHOLHDL, LDLDIRECT in the last 72 hours. Thyroid Function Tests: No results for input(s): TSH, T4TOTAL, FREET4, T3FREE, THYROIDAB in the last 72 hours. Anemia Panel: No results for input(s): VITAMINB12, FOLATE, FERRITIN, TIBC, IRON, RETICCTPCT in the last 72 hours. Urine analysis:    Component Value Date/Time   COLORURINE BROWN* 04/11/2013 0008   APPEARANCEUR CLOUDY* 04/11/2013 0008   LABSPEC 1.030 04/11/2013 0008   PHURINE 5.0 04/11/2013 0008   GLUCOSEU >1000* 04/11/2013 0008   GLUCOSEU 500 (?) 05/08/2010 1434   HGBUR LARGE* 04/11/2013 0008   BILIRUBINUR NEGATIVE 04/11/2013  0008   KETONESUR >80* 04/11/2013 0008   PROTEINUR >300* 04/11/2013 0008   UROBILINOGEN 0.2 04/11/2013 0008   NITRITE NEGATIVE 04/11/2013 0008   LEUKOCYTESUR SMALL* 04/11/2013 0008   Sepsis Labs: @LABRCNTIP (procalcitonin:4,lacticidven:4) )No results found for this or any previous visit (from the past 240 hour(s)).   Radiological Exams on Admission: Dg Chest 2 View  12/22/2015  CLINICAL DATA:  Cough, low-grade fever, chills. EXAM: CHEST  2 VIEW COMPARISON:  08/07/2015 chest radiograph. FINDINGS: Partially visualized surgical hardware overlying the lower cervical spine from ACDF. Stable cardiomediastinal silhouette with normal  heart size. No pneumothorax. No pleural effusion. There is patchy consolidation in the basilar right lower lobe, new. No pulmonary edema. IMPRESSION: Patchy basilar right lower lobe consolidation, most suggestive of a right lower lobe pneumonia. Recommend follow-up PA and lateral post treatment chest radiographs in 4-6 weeks. Electronically Signed   By: Ilona Sorrel M.D.   On: 12/22/2015 14:20    EKG: Independently reviewed. Sinus rhythm, RAD  Assessment/Plan  1. CAP  - RLL infiltrate on CXR, leukocytosis, fever, dyspnea  - Maintaining adequate saturation on rm air  - Treating empirically with Rocephin and azithromycin  - Blood cultures incubating, sputum culture requested  - Check urine for antigens to strep pneumo and legionella  - Supplemental O2 prn sat <93%    2. Chronic bronchitis with acute exacerbation  - Exacerbation precipitated by infection  - Received DuoNeb x2 and 125 mg IV Solu-Medrol en route to ED  - Continue DuoNeb q4h prn, Solu-Medrol 60 mg IV q6h  - Continue LABA/ICS with Dulera while in hospital  - Continuous pulse oximetry with titration of FiO2 to maintain sats >92%    3. Type I DM  - A1c was 8.0% in 2012, reflecting sub-optimal control at that time  - Managed with insulin pump, will continue  - Check CBG with meals and qHS to ensure  adequate control  - Carb-modified diet when appropriate   4. CAD - No anginal complaints  - No ischemic features on admission EKG  - Continue Zocor qHS    5. Depression, anxiety   - Stable; pt denies SI, HI, or hallucinations  - Continue management with trazodone    DVT prophylaxis: sq Lovenox Code Status: Full  Family Communication: Wife updated at the bedside  Disposition Plan: Observe on med-surg  Consults called: None Admission status: Observation    Vianne Bulls, MD [facility] Pager [phone]  If 7PM-7AM, please contact night-coverage [email or web address] Password [email or web address]  12/22/2015, 9:19 PM

## 2015-12-22 NOTE — Progress Notes (Signed)
Arrived on unit. Pt able to stand and step to bed.Wife at bedside with pt.Pt awake,alert and oriented x4.Pt oriented to room,call bell and safety plan.Yellow socks given,pt will apply when he gets up.No distress noted.wbb

## 2015-12-22 NOTE — ED Provider Notes (Signed)
CSN: [id number]     Arrival date & time 12/22/15  1825 History   First MD Initiated Contact with Patient 12/22/15 1838     Chief Complaint  Patient presents with  . Respiratory Distress   (Consider location/radiation/quality/duration/timing/severity/associated sxs/prior Treatment) HPI 60 y.o. male with a hx of Bronchitis, presents to the Emergency Department today complaining of shortness of breath. Pt states that he saw his Pulmonologist earlier today and was diagnosed with CAP. Given Rx. Levaquin and follow up if symptoms did not improve. No CP/ABD pain. No fevers at home. Noted one bout of emesis today. Chills for several days. En route with EMS, pt was given solumedrol and duoneb x 2 with improvement of symptoms. No other symptoms noted.    Past Medical History  Diagnosis Date  . HYPERLIPIDEMIA 04/04/2007  . ANXIETY 04/03/2007  . DEPRESSION 04/03/2007  . ASTHMATIC BRONCHITIS, ACUTE 10/25/2008  . ASTHMA 09/06/2008  . ED (erectile dysfunction)   . Cervical disc disease   . Bladder neck obstruction   . COPD (chronic obstructive pulmonary disease) ([place])   . Spinal stenosis   . Depression   . Anxiety   . CORONARY ARTERY DISEASE 04/03/2007  . CARPAL TUNNEL SYNDROME, BILATERAL 07/31/2007    issues resolved, no surgery  . DIABETES MELLITUS, TYPE I 04/03/2007  . Diabetic retinopathy associated with diabetes mellitus due to underlying condition ([place]) 04/03/2007  . DM W/EYE MANIFESTATIONS, TYPE I, UNCONTROLLED 04/04/2007  . Pneumonia     "several times and again today" (02/13/2104)  . Chronic bronchitis ([place])     "get it about q yr" (02/12/2014)  . Seizures ([place])     "insulin seizure from time to time; none in the last couple years" (02/12/2014)  . Renal insufficiency   . DM W/RENAL MNFST, TYPE I, UNCONTROLLED 04/04/2007   Past Surgical History  Procedure Laterality Date  . Anterior cervical decomp/discectomy fusion  2000    "couple screws and a plate"  . Appendectomy    . Stress cardiolite   09/06/2002  . Tonsillectomy    . Lymph node dissection  ~ 1960    groin  . Cystoscopy with retrograde pyelogram, ureteroscopy and stent placement Bilateral 04/06/2013    Procedure: BILATERAL CYSTOSCOPY WITH RETROGRADE PYELOGRAMS, STENT PLACEMENTS AND LEFT URETEROSCOPY AND STONE REMOVAL;  Surgeon: Alexis Frock, MD;  Location: [facility];  Service: Urology;  Laterality: Bilateral;  . Holmium laser application Left [id number]    Procedure: HOLMIUM LASER APPLICATION;  Surgeon: Alexis Frock, MD;  Location: [facility];  Service: Urology;  Laterality: Left;  . Cystoscopy/retrograde/ureteroscopy Bilateral 04/12/2013    Procedure: CYSTOSCOPY/RETROGRADE/URETEROSCOPY;  Surgeon: Alexis Frock, MD;  Location: [facility];  Service: Urology;  Laterality: Bilateral;  RIGHT RETROGRADE   . Cystoscopy with stent placement Right 04/12/2013    Procedure: CYSTOSCOPY WITH STENT PLACEMENT;  Surgeon: Alexis Frock, MD;  Location: [facility];  Service: Urology;  Laterality: Right;  . Back surgery    . Cardiac catheterization  1990's  . Vitrectomy Bilateral   . Cataract extraction w/ intraocular lens  implant, bilateral Bilateral    Family History  Problem Relation Age of Onset  . Stroke Father     strong FH cerebrovascular disease  . Diabetes Brother   . Heart disease Brother     CHF  . Colon cancer Neg Hx   . Cancer Mother    Social History  Substance Use Topics  . Smoking status: Former Smoker -- 1.00 packs/Thune for 41 years    Types: Cigarettes  Quit date: 03/02/2015  . Smokeless tobacco: Never Used  . Alcohol Use: No    Review of Systems ROS reviewed and all are negative for acute change except as noted in the HPI.  Allergies  Iohexol and Lexapro  Home Medications   Prior to Admission medications   Medication Sig Start Date End Date Taking? Authorizing Provider  albuterol (VENTOLIN HFA) 108 (90 BASE) MCG/ACT inhaler Inhale 2 puffs into the lungs every 6 (six) hours as needed for wheezing or shortness of  breath. 04/11/15  Yes Tammy S Parrett, NP  budesonide-formoterol (SYMBICORT) 160-4.5 MCG/ACT inhaler Take 2 puffs first thing in am and then another 2 puffs about 12 hours later. Patient taking differently: Inhale 2 puffs into the lungs every 12 (twelve) hours.  04/04/15  Yes Tanda Rockers, MD  CHANTIX CONTINUING MONTH PAK 1 MG tablet Take 1 mg by mouth 2 (two) times daily.  05/09/15  Yes Historical Provider, MD  ibuprofen (ADVIL,MOTRIN) 200 MG tablet Take 400 mg by mouth every 6 (six) hours as needed for moderate pain.    Yes Historical Provider, MD  Insulin Human (INSULIN PUMP) SOLN Inject into the skin continuous. Humalog 100 units/ml   Yes Historical Provider, MD  promethazine (PHENERGAN) 25 MG tablet Take 1 tablet (25 mg total) by mouth every 6 (six) hours as needed for nausea or vomiting. 12/12/15  Yes Tammy S Parrett, NP  simvastatin (ZOCOR) 40 MG tablet Take 40 mg by mouth daily. 11/24/15  Yes Historical Provider, MD  traZODone (DESYREL) 150 MG tablet Take 150 mg by mouth at bedtime. 11/24/15  Yes Historical Provider, MD  HYDROcodone-homatropine (HYDROMET) 5-1.5 MG/5ML syrup Take 5 mLs by mouth every 6 (six) hours as needed. Patient not taking: Reported on 12/22/2015 12/11/15   Fonnie Mu Parrett, NP  levofloxacin (LEVAQUIN) 750 MG tablet Take 1 tablet (750 mg total) by mouth daily. Patient not taking: Reported on 12/22/2015 12/22/15   Rigoberto Noel, MD   BP 112/45 mmHg  Pulse 121  Temp(Src) 100.4 F (38 C) (Oral)  Resp 20  Ht 5\' 9"  (1.753 m)  Wt 80.74 kg  BMI 26.27 kg/m2  SpO2 96%   Physical Exam  Constitutional: He is oriented to person, place, and time. He appears well-developed and well-nourished.  HENT:  Head: Normocephalic and atraumatic.  Eyes: EOM are normal. Pupils are equal, round, and reactive to light.  Neck: Normal range of motion. Neck supple. No tracheal deviation present.  Cardiovascular: Regular rhythm, normal heart sounds and intact distal pulses.   No murmur  heard. Pulmonary/Chest: Effort normal. He has wheezes in the right upper field, the right lower field, the left upper field and the left lower field. He has rhonchi in the right upper field and the right lower field.  Abdominal: Soft. There is no tenderness.  Musculoskeletal: Normal range of motion.  Neurological: He is alert and oriented to person, place, and time.  Skin: Skin is warm and dry.  Psychiatric: He has a normal mood and affect. His behavior is normal. Thought content normal.  Nursing note and vitals reviewed.  ED Course  Procedures (including critical care time) Labs Review Labs Reviewed  CBC - Abnormal; Notable for the following:    WBC 15.0 (*)    MCHC 36.7 (*)    All other components within normal limits  COMPREHENSIVE METABOLIC PANEL - Abnormal; Notable for the following:    Glucose, Bld 137 (*)    Calcium 8.5 (*)    Total Protein  6.4 (*)    Total Bilirubin 1.5 (*)    All other components within normal limits  CULTURE, BLOOD (ROUTINE X 2)  CULTURE, BLOOD (ROUTINE X 2)  I-STAT CG4 LACTIC ACID, ED  Randolm Idol, ED   Imaging Review Dg Chest 2 View  12/22/2015  CLINICAL DATA:  Cough, low-grade fever, chills. EXAM: CHEST  2 VIEW COMPARISON:  08/07/2015 chest radiograph. FINDINGS: Partially visualized surgical hardware overlying the lower cervical spine from ACDF. Stable cardiomediastinal silhouette with normal heart size. No pneumothorax. No pleural effusion. There is patchy consolidation in the basilar right lower lobe, new. No pulmonary edema. IMPRESSION: Patchy basilar right lower lobe consolidation, most suggestive of a right lower lobe pneumonia. Recommend follow-up PA and lateral post treatment chest radiographs in 4-6 weeks. Electronically Signed   By: Ilona Sorrel M.D.   On: 12/22/2015 14:20   I have personally reviewed and evaluated these images and lab results as part of my medical decision-making.   EKG Interpretation None      MDM  I have reviewed  and evaluated the relevant laboratory values I have reviewed and evaluated the relevant imaging studies. I have reviewed the relevant previous healthcare records. I obtained HPI from historian. Patient discussed with supervising physician  ED Course:  Assessment: Pt is a 59yM with hx Bronchitis who presents with shortness of breath. No chest pain. Seen by Pulmonologist today and Dx CAP. Given Levaquin. Worsening symptoms throughout the [place]. EMS called and given solumedrol and duonebx2. On exam, pt in Nontoxic/nonseptic appearing. VS with tachycardia and low grade temp. Lungs with bilateral wheeze and rhonchi right side. Abdomen nontender soft. iStat Lactate 1.74. WBC 15. Blood Cx drawn. Given IV rocephin and azithro abx to cover CAP. Previous CXR at outpatient showed RLL PNA. Current EKG unremarkable with no acute abnormalities. iStat Trop pending. Plan is to Admit to medicine. Sepsis reassessment was completed at this time. Plan is to admit to medicine.  Disposition/Plan:  Admit Pt acknowledges and agrees with plan  Supervising Physician Charlesetta Shanks, MD   Final diagnoses:  Community acquired pneumonia      Shary Decamp, PA-C 12/22/15 2100  Charlesetta Shanks, MD 01/04/16 [phone]

## 2015-12-22 NOTE — Assessment & Plan Note (Signed)
Levaquin 750 mg daily 7 days Take course of prednisone Follow-up chest x-ray in one week Call us if no better by mid week  Use Mucinex 600 mg twice daily  Low threshold to admit for IV antibiotics if he does not improve within a few days

## 2015-12-22 NOTE — Progress Notes (Signed)
Nursing Note: Talked with on-all,will give orders for ssi.wbb

## 2015-12-22 NOTE — Progress Notes (Addendum)
Nursing Note: Pt has insulin pump that he manages at home himself.Pt checked sugar as he as concerned since he received solumedrol earlier.Cbg was 362 mg/dl with his machine.Pt gave himself 5 units of insulin using his insulin pump.Re-checked with our machine and cbg was 331.Paged on-call and made aware.wbb

## 2015-12-22 NOTE — ED Notes (Addendum)
Per EMS, pt from home, reports dx of PNA by his PCP today, went home became really SOB.  2 rounds of neb tx and 157mcg given en route.  Pt is A&O x 4.

## 2015-12-22 NOTE — ED Notes (Signed)
Pt reports going to see his pulmonologist today and had a CXR done.  Was found to have PNA and was sent home with abx.  Pt reports he became severely short of breath and shaky and vomited x 1.  Pt's face appears flushed.  Pt is A&Ox 4.  Pt also reports non-productive cough.  Denies any cough at this time.

## 2015-12-22 NOTE — Progress Notes (Signed)
PHARMACY NOTE -  ANTIBIOTIC RENAL DOSE ADJUSTMENT    Request received for Pharmacy to assist with antibiotic renal dose adjustment.   Patient has been initiated on azithromycin and ceftriaxone for CAP.  Neither agent requires renal adjustment  Current dosage is appropriate and need for further dosage adjustment appears unlikely at present.  Will sign off at this time.  Please reconsult if a change in clinical status warrants re-evaluation of dosage.  Reuel Boom, PharmD, BCPS Pager: [phone] 12/22/2015, 9:27 PM

## 2015-12-23 DIAGNOSIS — E785 Hyperlipidemia, unspecified: Secondary | ICD-10-CM | POA: Diagnosis not present

## 2015-12-23 DIAGNOSIS — E119 Type 2 diabetes mellitus without complications: Secondary | ICD-10-CM | POA: Diagnosis not present

## 2015-12-23 DIAGNOSIS — E1039 Type 1 diabetes mellitus with other diabetic ophthalmic complication: Secondary | ICD-10-CM | POA: Diagnosis not present

## 2015-12-23 DIAGNOSIS — Z794 Long term (current) use of insulin: Secondary | ICD-10-CM

## 2015-12-23 DIAGNOSIS — J45909 Unspecified asthma, uncomplicated: Secondary | ICD-10-CM | POA: Diagnosis not present

## 2015-12-23 DIAGNOSIS — F418 Other specified anxiety disorders: Secondary | ICD-10-CM | POA: Diagnosis not present

## 2015-12-23 DIAGNOSIS — F329 Major depressive disorder, single episode, unspecified: Secondary | ICD-10-CM | POA: Diagnosis not present

## 2015-12-23 DIAGNOSIS — J189 Pneumonia, unspecified organism: Secondary | ICD-10-CM | POA: Diagnosis not present

## 2015-12-23 DIAGNOSIS — E1029 Type 1 diabetes mellitus with other diabetic kidney complication: Secondary | ICD-10-CM | POA: Diagnosis not present

## 2015-12-23 DIAGNOSIS — J449 Chronic obstructive pulmonary disease, unspecified: Secondary | ICD-10-CM | POA: Diagnosis not present

## 2015-12-23 DIAGNOSIS — I2510 Atherosclerotic heart disease of native coronary artery without angina pectoris: Secondary | ICD-10-CM | POA: Diagnosis not present

## 2015-12-23 LAB — BASIC METABOLIC PANEL
ANION GAP: 6 (ref 5–15)
BUN: 18 mg/dL (ref 6–20)
CALCIUM: 8.4 mg/dL — AB (ref 8.9–10.3)
CO2: 25 mmol/L (ref 22–32)
Chloride: 107 mmol/L (ref 101–111)
Creatinine, Ser: 0.81 mg/dL (ref 0.61–1.24)
GFR calc Af Amer: >60 mL/min (ref >60)
GFR calc non Af Amer: >60 mL/min (ref >60)
GLUCOSE: 125 mg/dL — AB (ref 65–99)
POTASSIUM: 3.7 mmol/L (ref 3.5–5.1)
Sodium: 138 mmol/L (ref 135–145)

## 2015-12-23 LAB — GLUCOSE, CAPILLARY
GLUCOSE-CAPILLARY: 124 mg/dL — AB (ref 65–99)
GLUCOSE-CAPILLARY: 215 mg/dL — AB (ref 65–99)
Glucose-Capillary: 131 mg/dL — ABNORMAL HIGH (ref 65–99)

## 2015-12-23 LAB — STREP PNEUMONIAE URINARY ANTIGEN: Strep Pneumo Urinary Antigen: NEGATIVE

## 2015-12-23 LAB — HIV ANTIBODY (ROUTINE TESTING W REFLEX): HIV SCREEN 4TH GENERATION: NONREACTIVE

## 2015-12-23 MED ORDER — PREDNISONE 10 MG PO TABS: ORAL_TABLET | ORAL | Status: DC

## 2015-12-23 MED FILL — predniSONE 10 MG (21) TBPK: 10 | 6 days supply | Qty: 21 | Fill #0

## 2015-12-23 NOTE — Progress Notes (Signed)
Patient's d/c instructions and prescriptions given, verbalized understanding. Denies pain.  No c/o SOB.

## 2015-12-23 NOTE — Progress Notes (Signed)
Patient d/c home. Stable on d/c.

## 2015-12-23 NOTE — Progress Notes (Signed)
Nursing Note: cbg-214.No insulin delivered.wbb

## 2015-12-23 NOTE — Discharge Summary (Signed)
Discharge Summary  Frazier Metzker Wurster [id number] DOB: 09-16-1955  PCP: Sheela Stack, MD  Admit date: 12/22/2015 Discharge date: 12/23/2015  Time spent: <21mins  Recommendations for Outpatient Follow-up:  1. F/u with pulmonology within a week  for hospital discharge follow up, repeat cbc/bmp at follow up, repeat cxr in three weeks 2. F/u with pmd Dr Forde Dandy for blood sugar control  Discharge Diagnoses:  Active Hospital Problems   Diagnosis Date Noted  . CAP (community acquired pneumonia) 12/22/2015  . Community acquired pneumonia   . Esophageal reflux 12/21/2010  . Chronic asthmatic bronchitis ([place]) 10/25/2008  . HLD (hyperlipidemia) 04/04/2007  . Insulin dependent diabetes mellitus ([place]) 04/03/2007  . CAD (coronary artery disease) 04/03/2007    Resolved Hospital Problems   Diagnosis Date Noted Date Resolved  No resolved problems to display.    Discharge Condition: stable  Diet recommendation: heart healthy/carb modified  Filed Weights   12/22/15 1843 12/22/15 2212  Weight: 80.74 kg (178 lb) 81.103 kg (178 lb 12.8 oz)    History of present illness:  Chief Complaint: SOB, fever, malaise  HPI: Jachai Kenner Nix is a 60 y.o. male with medical history significant for chronic bronchitis, type 1 diabetes mellitus on insulin pump, coronary artery disease, depression, and anxiety presents to the ED with approximately 10 days of dyspnea, productive cough, and malaise. Patient reports a history of chronic bronchitis and remote tobacco abuse who was in his usual state of health until the insidious development of worsening dyspnea and cough productive of thick white sputum approximately 10 days ago. He was evaluated by his pulmonologist at that time and treated with a course of Augmentin and prednisone. Unfortunately, the patient was unable to tolerate the GI side effects from Augmentin and did not complete the course. Patient called in to his pulmonology office 2 days ago to report that he had  not gotten any better and today second course of prednisone was prescribed. He was evaluated in the pulmonology clinic earlier today with chest x-ray suggestive of a right lower lobe pneumonia. He was given a course of Levaquin, but unfortunately, upon returning home, his condition worsened and he activated EMS for severe dyspnea. Patient was given DuoNeb 2 en route to the hospital and 125 mg IV Solu-Medrol was also administered by EMS.  ED Course: Upon arrival to the ED, patient is found to be febrile to 38 C, saturating adequately on room air, tachycardic in the 110s and with stable blood pressure. EKG features a sinus rhythm with right axis deviation. Chemistry panel is notable for a mild hyperbilirubinemia and mild hypocalcemia. CBC features a leukocytosis to 15,000. Lactic acid is reassuring at 1.74. Blood cultures were obtained in the emergency department and the patient was treated with empiric Rocephin and azithromycin. He remained hemodynamically stable in the emergency department and will be admitted to the hospital for ongoing evaluation and management of CPAP.  Hospital Course:  Principal Problem:   CAP (community acquired pneumonia) Active Problems:   Insulin dependent diabetes mellitus ([facility])   HLD (hyperlipidemia)   CAD (coronary artery disease)   Chronic asthmatic bronchitis ([facility])   Esophageal reflux   Community acquired pneumonia   1. CAP  - RLL infiltrate on CXR, leukocytosis, fever, dyspnea  - Maintaining adequate saturation on rm air  - Treating empirically with Rocephin and azithromycin  - Blood cultures in process, sputum culture not collected  -  urine for antigens to strep pneumo and legionella in process - patient feeling much better ,  on room air, lung exam has much improved, no wheezing, good aeration, no rhonchi, patient requested to be discharge home due to an important test for job tomorrow, he is discharged on levaquin which was prescribed by his  pulmonologist yesterday  2. Chronic bronchitis with acute exacerbation  - Exacerbation precipitated by infection  - Received DuoNeb x2 and 125 mg IV Solu-Medrol en route to ED  - Continue DuoNeb q4h prn, Solu-Medrol 60 mg IV q6h  - Continue LABA/ICS with [place] while in hospital  - much better, he is discharged home with steroid taper, abx and continue home nebs  3. Type I DM , on insulin pump, reported recent a1c was 7%, followed  By Dr Forde Dandy - Managed with insulin pump,  - Carb-modified diet when appropriate   4. CAD - No anginal complaints  - No ischemic features on admission EKG  - Continue Zocor qHS   5. Depression, anxiety  - Stable; pt denies SI, HI, or hallucinations  - Continue management with trazodone     Code Status: Full  Family Communication: patient Disposition Plan: home  Consults called: None   Discharge Exam: BP 125/67 mmHg  Pulse 70  Temp(Src) 97.8 F (36.6 C) (Oral)  Resp 18  Ht 5\' 9"  (1.753 m)  Wt 81.103 kg (178 lb 12.8 oz)  BMI 26.39 kg/m2  SpO2 97%  General: aaox3, NAD Cardiovascular: RRR Respiratory: CTABL  Discharge Instructions You were cared for by a hospitalist during your hospital stay. If you have any questions about your discharge medications or the care you received while you were in the hospital after you are discharged, you can call the unit and asked to speak with the hospitalist on call if the hospitalist that took care of you is not available. Once you are discharged, your primary care physician will handle any further medical issues. Please note that NO REFILLS for any discharge medications will be authorized once you are discharged, as it is imperative that you return to your primary care physician (or establish a relationship with a primary care physician if you do not have one) for your aftercare needs so that they can reassess your need for medications and monitor your lab values.      Discharge Instructions     Diet - low sodium heart healthy    Complete by:  As directed   Carb modified     Increase activity slowly    Complete by:  As directed             Medication List    TAKE these medications        albuterol 108 (90 Base) MCG/ACT inhaler  Commonly known as:  VENTOLIN HFA  Inhale 2 puffs into the lungs every 6 (six) hours as needed for wheezing or shortness of breath.     budesonide-formoterol 160-4.5 MCG/ACT inhaler  Commonly known as:  SYMBICORT  Take 2 puffs first thing in am and then another 2 puffs about 12 hours later.     CHANTIX CONTINUING MONTH PAK 1 MG tablet  Generic drug:  varenicline  Take 1 mg by mouth 2 (two) times daily.     HYDROcodone-homatropine 5-1.5 MG/5ML syrup  Commonly known as:  HYDROMET  Take 5 mLs by mouth every 6 (six) hours as needed.     ibuprofen 200 MG tablet  Commonly known as:  ADVIL,MOTRIN  Take 400 mg by mouth every 6 (six) hours as needed for moderate pain.     insulin  pump Soln  Inject into the skin continuous. Humalog 100 units/ml     levofloxacin 750 MG tablet  Commonly known as:  LEVAQUIN  Take 1 tablet (750 mg total) by mouth daily.     predniSONE 10 MG tablet  Commonly known as:  DELTASONE  Take 6tabs on [place] one, 5tabs on [place] two, 4tabs on [place] three, 3tabs on [place] four, 2tabs on [place] five, 1tab on [place] six, then stop.     promethazine 25 MG tablet  Commonly known as:  PHENERGAN  Take 1 tablet (25 mg total) by mouth every 6 (six) hours as needed for nausea or vomiting.     simvastatin 40 MG tablet  Commonly known as:  ZOCOR  Take 40 mg by mouth daily.     traZODone 150 MG tablet  Commonly known as:  DESYREL  Take 150 mg by mouth at bedtime.       Allergies  Allergen Reactions  . Iohexol      Code: HIVES, Desc: PATIENT STATES HE IS ALLERGIC TO IV DYE, nervousness, dizziness, light sweat   . Lexapro [Escitalopram Oxalate] Itching   Follow-up Information    Follow up with Rigoberto Noel., MD In 1 week.   Specialty:   Pulmonary Disease   Why:  hospital discharge follow up, repeat cxr in three weeks   Contact information:   [address] [place] [address] [phone]        The results of significant diagnostics from this hospitalization (including imaging, microbiology, ancillary and laboratory) are listed below for reference.    Significant Diagnostic Studies: Dg Chest 2 View  12/22/2015  CLINICAL DATA:  Cough, low-grade fever, chills. EXAM: CHEST  2 VIEW COMPARISON:  08/07/2015 chest radiograph. FINDINGS: Partially visualized surgical hardware overlying the lower cervical spine from ACDF. Stable cardiomediastinal silhouette with normal heart size. No pneumothorax. No pleural effusion. There is patchy consolidation in the basilar right lower lobe, new. No pulmonary edema. IMPRESSION: Patchy basilar right lower lobe consolidation, most suggestive of a right lower lobe pneumonia. Recommend follow-up PA and lateral post treatment chest radiographs in 4-6 weeks. Electronically Signed   By: Ilona Sorrel M.D.   On: 12/22/2015 14:20    Microbiology: No results found for this or any previous visit (from the past 240 hour(s)).   Labs: Basic Metabolic Panel:  Recent Labs Lab 12/22/15 1907 12/23/15 0355  NA 137 138  K 3.5 3.7  CL 106 107  CO2 22 25  GLUCOSE 137* 125*  BUN 15 18  CREATININE 0.99 0.81  CALCIUM 8.5* 8.4*   Liver Function Tests:  Recent Labs Lab 12/22/15 1907  AST 20  ALT 24  ALKPHOS 75  BILITOT 1.5*  PROT 6.4*  ALBUMIN 4.2   No results for input(s): LIPASE, AMYLASE in the last 168 hours. No results for input(s): AMMONIA in the last 168 hours. CBC:  Recent Labs Lab 12/22/15 1907  WBC 15.0*  HGB 14.4  HCT 39.2  MCV 88.5  PLT 227   Cardiac Enzymes: No results for input(s): CKTOTAL, CKMB, CKMBINDEX, TROPONINI in the last 168 hours. BNP: BNP (last 3 results) No results for input(s): BNP in the last 8760 hours.  ProBNP (last 3 results) No results for input(s):  PROBNP in the last 8760 hours.  CBG:  Recent Labs Lab 12/22/15 2228 12/22/15 2349 12/23/15 0206 12/23/15 0733 12/23/15 1204  GLUCAP 331* 214* 131* 124* 215*       Signed:  Duquan Gillooly MD, PhD  [facility] 12/23/2015, 1:17 PM

## 2015-12-23 NOTE — Progress Notes (Signed)
Inpatient Diabetes Program Recommendations  AACE/ADA: New Consensus Statement on Inpatient Glycemic Control (2015)  Target Ranges:  Prepandial:   less than 140 mg/dL      Peak postprandial:   less than 180 mg/dL (1-2 hours)      Critically ill patients:  140 - 180 mg/dL   Lab Results  Component Value Date   GLUCAP 215* 12/23/2015   HGBA1C 8.0* 04/01/2011    Review of Glycemic Control  Diabetes history: DM1 Outpatient Diabetes medications: Insulin Pump Current orders for Inpatient glycemic control: Insulin Pump per home use  Pt states HgbA1C is usually in the 7's. Sees CDE at [name] office Q3 months and his insulin pump settings are adjusted.  Inpatient Diabetes Program Recommendations:    Continue with insulin pump per home settings.  Will follow while inpatient. Thank you. Lorenda Peck, RD, LDN, CDE Inpatient Diabetes Coordinator [phone]

## 2015-12-23 NOTE — Progress Notes (Signed)
Nursing Note: cbg-131.No insulin delivered.wbb

## 2015-12-24 LAB — HEMOGLOBIN A1C
HEMOGLOBIN A1C: 6.4 % — AB (ref 4.8–5.6)
Mean Plasma Glucose: 137 mg/dL

## 2015-12-24 LAB — LEGIONELLA PNEUMOPHILA SEROGP 1 UR AG: L. pneumophila Serogp 1 Ur Ag: NEGATIVE

## 2015-12-26 ENCOUNTER — Telehealth: Payer: Self-pay | Admitting: Pulmonary Disease

## 2015-12-26 NOTE — Telephone Encounter (Signed)
lmtcb x1 for pt. 

## 2015-12-27 LAB — CULTURE, BLOOD (ROUTINE X 2)
CULTURE: NO GROWTH
Culture: NO GROWTH

## 2015-12-29 ENCOUNTER — Ambulatory Visit (INDEPENDENT_AMBULATORY_CARE_PROVIDER_SITE_OTHER)
Admission: RE | Admit: 2015-12-29 | Discharge: 2015-12-29 | Disposition: A | Discharge status: Home / Self Care | Payer: 59 | Source: Ambulatory Visit | Attending: Pulmonary Disease | Admitting: Pulmonary Disease

## 2015-12-29 ENCOUNTER — Ambulatory Visit (INDEPENDENT_AMBULATORY_CARE_PROVIDER_SITE_OTHER): Payer: 59 | Admitting: Adult Health

## 2015-12-29 ENCOUNTER — Encounter: Payer: Self-pay | Admitting: Adult Health

## 2015-12-29 VITALS — BP 142/76 | HR 61 | Temp 98.0°F | Ht 69.0 in | Wt 175.0 lb

## 2015-12-29 DIAGNOSIS — J189 Pneumonia, unspecified organism: Secondary | ICD-10-CM

## 2015-12-29 DIAGNOSIS — J449 Chronic obstructive pulmonary disease, unspecified: Secondary | ICD-10-CM

## 2015-12-29 DIAGNOSIS — R05 Cough: Secondary | ICD-10-CM | POA: Diagnosis not present

## 2015-12-29 DIAGNOSIS — R0602 Shortness of breath: Secondary | ICD-10-CM | POA: Diagnosis not present

## 2015-12-29 DIAGNOSIS — R059 Cough, unspecified: Secondary | ICD-10-CM

## 2015-12-29 DIAGNOSIS — J45909 Unspecified asthma, uncomplicated: Secondary | ICD-10-CM

## 2015-12-29 NOTE — Progress Notes (Signed)
Subjective:    Patient ID: James [name], male    DOB: Jul 11, 1956, 60 y.o.   MRN: [id number]  HPI 31 yowm quit smoking 03/2015 with nl pfts 05/22/2015 off all rx C/w AB / not copd  Turns out he has chronic volume loss in his left lung on imaging, ? Congenital   PMhx >> HLD, Anxiety/depression, Spinal stenosis, CAD, DM type I with retinopathy, CKD  12/29/2015 [facility] follow up  Pt returns for a post hospital follow up .  Patient was admitted with right lower lobe pneumonia. He was started on IV antibiotics and discharged on Levaquin. Patient is feeling improved. Her to admission. Patient had been having a slow to resolve COPD exacerbation that was unresponsive to Augmentin. Fer[place] of admission. Patient was seen in the office and given Levaquin. However he worsened upon returning home and went to ER .  Since discharge he is feeling better. Still feels weak but much better.  CXR today shows clear lungs. .  Patient denies any chest pain, orthopnea, PND, or increased leg swelling.  Past Medical History  Diagnosis Date  . HYPERLIPIDEMIA 04/04/2007  . ANXIETY 04/03/2007  . DEPRESSION 04/03/2007  . ASTHMATIC BRONCHITIS, ACUTE 10/25/2008  . ASTHMA 09/06/2008  . ED (erectile dysfunction)   . Cervical disc disease   . Bladder neck obstruction   . COPD (chronic obstructive pulmonary disease) ([place])   . Spinal stenosis   . Depression   . Anxiety   . CORONARY ARTERY DISEASE 04/03/2007  . CARPAL TUNNEL SYNDROME, BILATERAL 07/31/2007    issues resolved, no surgery  . DIABETES MELLITUS, TYPE I 04/03/2007  . Diabetic retinopathy associated with diabetes mellitus due to underlying condition ([place]) 04/03/2007  . DM W/EYE MANIFESTATIONS, TYPE I, UNCONTROLLED 04/04/2007  . Pneumonia     "several times and again today" (02/13/2104)  . Chronic bronchitis ([place])     "get it about q yr" (02/12/2014)  . Seizures ([place])     "insulin seizure from time to time; none in the last couple years" (02/12/2014)  . Renal  insufficiency   . DM W/RENAL MNFST, TYPE I, UNCONTROLLED 04/04/2007   Current Outpatient Prescriptions on File Prior to Visit  Medication Sig Dispense Refill  . albuterol (VENTOLIN HFA) 108 (90 BASE) MCG/ACT inhaler Inhale 2 puffs into the lungs every 6 (six) hours as needed for wheezing or shortness of breath. 1 Inhaler 5  . budesonide-formoterol (SYMBICORT) 160-4.5 MCG/ACT inhaler Take 2 puffs first thing in am and then another 2 puffs about 12 hours later. (Patient taking differently: Inhale 2 puffs into the lungs every 12 (twelve) hours. ) 1 Inhaler 12  . CHANTIX CONTINUING MONTH PAK 1 MG tablet Take 1 mg by mouth 2 (two) times daily.   2  . ibuprofen (ADVIL,MOTRIN) 200 MG tablet Take 400 mg by mouth every 6 (six) hours as needed for moderate pain.     . Insulin Human (INSULIN PUMP) SOLN Inject into the skin continuous. Humalog 100 units/ml    . levofloxacin (LEVAQUIN) 750 MG tablet Take 1 tablet (750 mg total) by mouth daily. 7 tablet 0  . simvastatin (ZOCOR) 40 MG tablet Take 40 mg by mouth daily.  5  . traZODone (DESYREL) 150 MG tablet Take 150 mg by mouth at bedtime.  5  . HYDROcodone-homatropine (HYDROMET) 5-1.5 MG/5ML syrup Take 5 mLs by mouth every 6 (six) hours as needed. (Patient not taking: Reported on 12/29/2015) 240 mL 0  . promethazine (PHENERGAN) 25 MG tablet Take  1 tablet (25 mg total) by mouth every 6 (six) hours as needed for nausea or vomiting. (Patient not taking: Reported on 12/29/2015) 20 tablet 0   No current facility-administered medications on file prior to visit.     Significant tests/ events  Spirometry 2012 >> FEV1 1.94 (52%) PFTs 05/2015 ratio 74, FEV1 72%, no BD response, mild restriction  CT sinus 08/2013 neg  Ct chest 01/2014 - left volume loss ? congenital     Review of Systems  Constitutional:   No  weight loss, night sweats,  Fevers, chills, + fatigue, or  lassitude.  HEENT:   No headaches,  Difficulty swallowing,  Tooth/dental problems, or  Sore  throat,                No sneezing, itching, ear ache, nasal congestion, post nasal drip,   CV:  No chest pain,  Orthopnea, PND, swelling in lower extremities, anasarca, dizziness, palpitations, syncope.   GI  No heartburn, indigestion, abdominal pain, nausea, vomiting, diarrhea, change in bowel habits, loss of appetite, bloody stools.   Resp:  No excess mucus, no productive cough,  No non-productive cough,  No coughing up of blood.  No change in color of mucus.  No wheezing.  No chest wall deformity  Skin: no rash or lesions.  GU: no dysuria, change in color of urine, no urgency or frequency.  No flank pain, no hematuria   MS:  No joint pain or swelling.  No decreased range of motion.  No back pain.  Psych:  No change in mood or affect. No depression or anxiety.  No memory loss.         Objective:   Physical Exam  Filed Vitals:   12/29/15 1636  BP: 142/76  Pulse: 61  Temp: 98 F (36.7 C)  TempSrc: Oral  Height: 5\' 9"  (1.753 m)  Weight: 175 lb (79.379 kg)  SpO2: 97%    Gen. Pleasant, well-nourished ENT - no lesions, no post nasal drip Neck: No JVD, no thyromegaly, no carotid bruits Lungs: no use of accessory muscles, no dullness to percussion,  No wheezing noted.  Cardiovascular: Rhythm regular, heart sounds  normal, no murmurs or gallops, no peripheral edema Musculoskeletal: No deformities, no cyanosis or clubbing    CXR 12/29/2015  Mild hyperinflation without acute finding. reViewed independently  Tammy Parrett NP-C  [facility]  12/29/2015

## 2015-12-29 NOTE — Assessment & Plan Note (Signed)
RLL PNA -CAP resolved with abx  cxr is clear   Plan  Continue on Symbicort 2 puffs Twice daily  , rinse after use.  Finish Levaquin today .  Continue on Probiotic.  Follow up with Dr. Elsworth Soho  In 2 months and As needed   Please contact office for sooner follow up if symptoms do not improve or worsen or seek emergency care

## 2015-12-29 NOTE — Telephone Encounter (Signed)
lmtcb x2 for pt. 

## 2015-12-29 NOTE — Assessment & Plan Note (Signed)
Recent flare with associated PNA now resolving   Plan  Continue on Symbicort 2 puffs Twice daily  , rinse after use.  Finish Levaquin today .  Continue on Probiotic.  Follow up with Dr. Elsworth Soho  In 2 months and As needed   Please contact office for sooner follow up if symptoms do not improve or worsen or seek emergency care

## 2015-12-29 NOTE — Patient Instructions (Signed)
Continue on Symbicort 2 puffs Twice daily  , rinse after use.  Finish Levaquin today .  Continue on Probiotic.  Follow up with Dr. Elsworth Soho  In 2 months and As needed   Please contact office for sooner follow up if symptoms do not improve or worsen or seek emergency care

## 2015-12-30 NOTE — Progress Notes (Signed)
Reviewed & agree with plan  

## 2015-12-30 NOTE — Telephone Encounter (Signed)
Pt seen 6/12 by TP. Nothing further needed.

## 2015-12-31 MED FILL — traZODone HCL 150 MG TABS: 150 | 30 days supply | Qty: 30 | Fill #2

## 2015-12-31 MED FILL — SIMVASTATIN 40 MG TABLET: 40 | 90 days supply | Qty: 90 | Fill #1

## 2016-01-01 MED FILL — CONTOUR NEXT STRIPS: 87 days supply | Qty: 700 | Fill #0

## 2016-01-01 MED FILL — HumaLOG 100 UNIT/ML SOLN: 100 | 90 days supply | Qty: 90 | Fill #0

## 2016-01-02 ENCOUNTER — Telehealth: Payer: Self-pay | Admitting: Pulmonary Disease

## 2016-01-02 NOTE — Telephone Encounter (Signed)
Per patient's request, results have been left on voicemail. Nothing further needed.

## 2016-01-08 MED FILL — IBUPROFEN 800 MG TABLET: 800 | 10 days supply | Qty: 30 | Fill #0

## 2016-01-08 MED FILL — AMOXICILLIN 250 MG CAPSULE: 250 | 10 days supply | Qty: 30 | Fill #0

## 2016-01-20 DIAGNOSIS — E1065 Type 1 diabetes mellitus with hyperglycemia: Secondary | ICD-10-CM | POA: Diagnosis not present

## 2016-01-21 ENCOUNTER — Emergency Department (HOSPITAL_COMMUNITY): Payer: 59

## 2016-01-21 ENCOUNTER — Emergency Department (HOSPITAL_COMMUNITY)
Admission: EM | Admit: 2016-01-21 | Discharge: 2016-01-22 | Disposition: A | Discharge status: Home / Self Care | Payer: 59 | Attending: Emergency Medicine | Admitting: Emergency Medicine

## 2016-01-21 ENCOUNTER — Encounter (HOSPITAL_COMMUNITY): Payer: Self-pay | Admitting: Emergency Medicine

## 2016-01-21 DIAGNOSIS — Y999 Unspecified external cause status: Secondary | ICD-10-CM | POA: Insufficient documentation

## 2016-01-21 DIAGNOSIS — Z87891 Personal history of nicotine dependence: Secondary | ICD-10-CM | POA: Diagnosis not present

## 2016-01-21 DIAGNOSIS — Y939 Activity, unspecified: Secondary | ICD-10-CM | POA: Diagnosis not present

## 2016-01-21 DIAGNOSIS — W11XXXA Fall on and from ladder, initial encounter: Secondary | ICD-10-CM | POA: Insufficient documentation

## 2016-01-21 DIAGNOSIS — E785 Hyperlipidemia, unspecified: Secondary | ICD-10-CM | POA: Diagnosis not present

## 2016-01-21 DIAGNOSIS — I2510 Atherosclerotic heart disease of native coronary artery without angina pectoris: Secondary | ICD-10-CM | POA: Diagnosis not present

## 2016-01-21 DIAGNOSIS — R519 Headache, unspecified: Secondary | ICD-10-CM

## 2016-01-21 DIAGNOSIS — M542 Cervicalgia: Secondary | ICD-10-CM | POA: Diagnosis not present

## 2016-01-21 DIAGNOSIS — S299XXA Unspecified injury of thorax, initial encounter: Secondary | ICD-10-CM | POA: Diagnosis not present

## 2016-01-21 DIAGNOSIS — J449 Chronic obstructive pulmonary disease, unspecified: Secondary | ICD-10-CM | POA: Insufficient documentation

## 2016-01-21 DIAGNOSIS — S0081XA Abrasion of other part of head, initial encounter: Secondary | ICD-10-CM | POA: Insufficient documentation

## 2016-01-21 DIAGNOSIS — Y929 Unspecified place or not applicable: Secondary | ICD-10-CM | POA: Diagnosis not present

## 2016-01-21 DIAGNOSIS — S199XXA Unspecified injury of neck, initial encounter: Secondary | ICD-10-CM | POA: Diagnosis not present

## 2016-01-21 DIAGNOSIS — R51 Headache: Secondary | ICD-10-CM | POA: Diagnosis not present

## 2016-01-21 DIAGNOSIS — S0990XA Unspecified injury of head, initial encounter: Secondary | ICD-10-CM | POA: Diagnosis not present

## 2016-01-21 DIAGNOSIS — M25552 Pain in left hip: Secondary | ICD-10-CM | POA: Diagnosis not present

## 2016-01-21 DIAGNOSIS — M546 Pain in thoracic spine: Secondary | ICD-10-CM | POA: Diagnosis not present

## 2016-01-21 DIAGNOSIS — S3993XA Unspecified injury of pelvis, initial encounter: Secondary | ICD-10-CM | POA: Diagnosis not present

## 2016-01-21 DIAGNOSIS — M25551 Pain in right hip: Secondary | ICD-10-CM | POA: Diagnosis not present

## 2016-01-21 DIAGNOSIS — M549 Dorsalgia, unspecified: Secondary | ICD-10-CM

## 2016-01-21 DIAGNOSIS — E10319 Type 1 diabetes mellitus with unspecified diabetic retinopathy without macular edema: Secondary | ICD-10-CM | POA: Insufficient documentation

## 2016-01-21 NOTE — ED Notes (Signed)
Patient endorses that he fell from a ladder that was extended to approximately 20 feet. (estimated 18 foot fall per patient and family) Hit head after falling.  Patient is NOT on blood thinners

## 2016-01-21 NOTE — ED Notes (Addendum)
Pt back from radiology. Pt took his blood sugar with his own meter and it is 80 at this time. Pt states that is a little low for him and that he should drink something with sugar preferably coke. Lattie Haw PA notified and ordered to give him a coke.

## 2016-01-21 NOTE — ED Notes (Signed)
Pt. lost his balance and fell from a ladder while trimming a tree this afternoon , denies LOC , alert and oriented , reports pain at right occipital area , anterior neck pain and right upper back pain . C- collar applied at triage . Alert and oriented / respirations unlabored .

## 2016-01-21 NOTE — ED Provider Notes (Signed)
CSN: [id number]     Arrival date & time 01/21/16  2131 History   First MD Initiated Contact with Patient 01/21/16 2155     Chief Complaint  Patient presents with  . Fall     (Consider location/radiation/quality/duration/timing/severity/associated sxs/prior Treatment) Patient is a 60 y.o. male presenting with fall. The history is provided by the patient and medical records.  Fall Associated symptoms include headaches and neck pain.    60 year old male with history of hyperlipidemia, anxiety, depression, asthma, type 1 diabetes, COPD, diabetic retinopathy, renal insufficiency, presenting to the ED after a fall from a ladder.  He states he was on an extended ladder that extends approx 20 feet.  Ladder was leaned up against a tree while he was cutting branches.  He states he was about 2/3 way up ladder (so approx 12-14 feet off the ground).  He cut a few branches and went to cut another but it gave way to the weight of the ladder causing him to fall.  He states he remembers the initial part of the fall, however does not remember exactly how he landed on the ground. He thinks that he stayed awake, does not recall a period of unconsciousness. He was able to get up and walk across the street to the neighbors house to get help. He is currently having a headache, neck pain, and back pain. He denies any dizziness, visual disturbance, numbness, weakness, confusion, or changes in speech. He has remained ambulatory without difficulty. No bowel or bladder incontinence. Patient is not currently on any type of anticoagulation.  Tetanus is UTD.  No chest or abdominal pain. No pain of his extremities.  Past Medical History  Diagnosis Date  . HYPERLIPIDEMIA 04/04/2007  . ANXIETY 04/03/2007  . DEPRESSION 04/03/2007  . ASTHMATIC BRONCHITIS, ACUTE 10/25/2008  . ASTHMA 09/06/2008  . ED (erectile dysfunction)   . Cervical disc disease   . Bladder neck obstruction   . COPD (chronic obstructive pulmonary disease) ([place])    . Spinal stenosis   . Depression   . Anxiety   . CORONARY ARTERY DISEASE 04/03/2007  . CARPAL TUNNEL SYNDROME, BILATERAL 07/31/2007    issues resolved, no surgery  . DIABETES MELLITUS, TYPE I 04/03/2007  . Diabetic retinopathy associated with diabetes mellitus due to underlying condition ([place]) 04/03/2007  . DM W/EYE MANIFESTATIONS, TYPE I, UNCONTROLLED 04/04/2007  . Pneumonia     "several times and again today" (02/13/2104)  . Chronic bronchitis ([place])     "get it about q yr" (02/12/2014)  . Seizures ([place])     "insulin seizure from time to time; none in the last couple years" (02/12/2014)  . Renal insufficiency   . DM W/RENAL MNFST, TYPE I, UNCONTROLLED 04/04/2007   Past Surgical History  Procedure Laterality Date  . Anterior cervical decomp/discectomy fusion  2000    "couple screws and a plate"  . Appendectomy    . Stress cardiolite  09/06/2002  . Tonsillectomy    . Lymph node dissection  ~ 1960    groin  . Cystoscopy with retrograde pyelogram, ureteroscopy and stent placement Bilateral 04/06/2013    Procedure: BILATERAL CYSTOSCOPY WITH RETROGRADE PYELOGRAMS, STENT PLACEMENTS AND LEFT URETEROSCOPY AND STONE REMOVAL;  Surgeon: Alexis Frock, MD;  Location: [facility];  Service: Urology;  Laterality: Bilateral;  . Holmium laser application Left [id number]    Procedure: HOLMIUM LASER APPLICATION;  Surgeon: Alexis Frock, MD;  Location: [facility];  Service: Urology;  Laterality: Left;  . Cystoscopy/retrograde/ureteroscopy Bilateral 04/12/2013  Procedure: CYSTOSCOPY/RETROGRADE/URETEROSCOPY;  Surgeon: Alexis Frock, MD;  Location: [facility];  Service: Urology;  Laterality: Bilateral;  RIGHT RETROGRADE   . Cystoscopy with stent placement Right 04/12/2013    Procedure: CYSTOSCOPY WITH STENT PLACEMENT;  Surgeon: Alexis Frock, MD;  Location: [facility];  Service: Urology;  Laterality: Right;  . Back surgery    . Cardiac catheterization  1990's  . Vitrectomy Bilateral   . Cataract extraction w/ intraocular  lens  implant, bilateral Bilateral    Family History  Problem Relation Age of Onset  . Stroke Father     strong FH cerebrovascular disease  . Diabetes Brother   . Heart disease Brother     CHF  . Colon cancer Neg Hx   . Cancer Mother    Social History  Substance Use Topics  . Smoking status: Former Smoker -- 0.00 packs/Couser for 41 years    Types: Cigarettes    Quit date: 03/02/2015  . Smokeless tobacco: Never Used  . Alcohol Use: No    Review of Systems  Musculoskeletal: Positive for back pain and neck pain.  Neurological: Positive for headaches.  All other systems reviewed and are negative.     Allergies  Iohexol and Lexapro  Home Medications   Prior to Admission medications   Medication Sig Start Date End Date Taking? Authorizing Provider  albuterol (VENTOLIN HFA) 108 (90 BASE) MCG/ACT inhaler Inhale 2 puffs into the lungs every 6 (six) hours as needed for wheezing or shortness of breath. 04/11/15   Tammy S Parrett, NP  budesonide-formoterol (SYMBICORT) 160-4.5 MCG/ACT inhaler Take 2 puffs first thing in am and then another 2 puffs about 12 hours later. Patient taking differently: Inhale 2 puffs into the lungs every 12 (twelve) hours.  04/04/15   Tanda Rockers, MD  CHANTIX CONTINUING MONTH PAK 1 MG tablet Take 1 mg by mouth 2 (two) times daily.  05/09/15   Historical Provider, MD  HYDROcodone-homatropine (HYDROMET) 5-1.5 MG/5ML syrup Take 5 mLs by mouth every 6 (six) hours as needed. Patient not taking: Reported on 12/29/2015 12/11/15   Fonnie Mu Parrett, NP  ibuprofen (ADVIL,MOTRIN) 200 MG tablet Take 400 mg by mouth every 6 (six) hours as needed for moderate pain.     Historical Provider, MD  Insulin Human (INSULIN PUMP) SOLN Inject into the skin continuous. Humalog 100 units/ml    Historical Provider, MD  levofloxacin (LEVAQUIN) 750 MG tablet Take 1 tablet (750 mg total) by mouth daily. 12/22/15   Rigoberto Noel, MD  promethazine (PHENERGAN) 25 MG tablet Take 1 tablet (25 mg  total) by mouth every 6 (six) hours as needed for nausea or vomiting. Patient not taking: Reported on 12/29/2015 12/12/15   Fonnie Mu Parrett, NP  simvastatin (ZOCOR) 40 MG tablet Take 40 mg by mouth daily. 11/24/15   Historical Provider, MD  traZODone (DESYREL) 150 MG tablet Take 150 mg by mouth at bedtime. 11/24/15   Historical Provider, MD   BP 125/72 mmHg  Pulse 83  Temp(Src) 98.4 F (36.9 C) (Oral)  Resp 23  Ht 5\' 8"  (1.727 m)  Wt 79.379 kg  BMI 26.61 kg/m2  SpO2 97%   Physical Exam  Constitutional: He is oriented to person, place, and time. He appears well-developed and well-nourished. No distress.  HENT:  Head: Normocephalic and atraumatic.  Mouth/Throat: Oropharynx is clear and moist.  Small abrasion noted to right forehead and right cheek, midface stable, dentition intact  Eyes: Conjunctivae and EOM are normal. Pupils are equal, round,  and reactive to light.  Pupils symmetric and reactive bilaterally, EOMs fully intact, no nystagmus, normal confrontation  Neck:  c-collar in place  Cardiovascular: Normal rate, regular rhythm and normal heart sounds.   Pulmonary/Chest: Effort normal and breath sounds normal. No respiratory distress. He has no wheezes.  No chest wall deformities or crepitus, lungs clear bilaterally, no distress  Abdominal: Soft. Bowel sounds are normal. There is no tenderness. There is no guarding.  Musculoskeletal: Normal range of motion. He exhibits no edema.       Cervical back: He exhibits tenderness, bony tenderness and pain.       Thoracic back: He exhibits tenderness, bony tenderness and pain.       Lumbar back: Normal.  Pelvis stable, nontender, no leg shortening Tenderness of midline cervical and thoracic back, c-collar in place; no acute deformity or step-off, full range of motion maintained of thoracic spine Lumbar spine nontender  Neurological: He is alert and oriented to person, place, and time.  AAOx3, answering questions and following commands  appropriately; equal strength UE and LE bilaterally; CN grossly intact; moves all extremities appropriately without ataxia; no focal neuro deficits or facial asymmetry appreciated  Skin: Skin is warm and dry. He is not diaphoretic.  Psychiatric: He has a normal mood and affect.  Nursing note and vitals reviewed.   ED Course  Procedures (including critical care time) Labs Review Labs Reviewed - No data to display  Imaging Review Dg Chest 2 View  01/21/2016  CLINICAL DATA:  [place] 4 feet from a ladder landing on the back. Back pain. EXAM: CHEST  2 VIEW COMPARISON:  Lumbar spine radiography same [facility]. Chest radiography 12/29/2015 and multiple previous FINDINGS: Heart size is normal. Mediastinal shadows are normal. The lungs are clear. No pneumothorax or hemothorax. Old healed rib fractures on the left. Old superior endplate fracture at T5. No acute finding. IMPRESSION: No active cardiopulmonary disease.  No acute traumatic finding. Electronically Signed   By: Nelson Chimes M.D.   On: 01/21/2016 22:46   Dg Thoracic Spine 2 View  01/21/2016  CLINICAL DATA:  [place] 4 feet off a ladder today landing on the back. Back pain. EXAM: THORACIC SPINE 2 VIEWS COMPARISON:  12/29/2015 chest radiography.  CT chest 02/12/2014 FINDINGS: Again demonstrated is an old superior endplate partial compression fracture at T5. No other thoracic region fracture. Posterior medial ribs appear intact IMPRESSION: Old superior endplate compression deformity at T5, unchanged. No acute injury by plain radiography. Electronically Signed   By: Nelson Chimes M.D.   On: 01/21/2016 22:44   Dg Pelvis 1-2 Views  01/21/2016  CLINICAL DATA:  Hip pain after fall from ladder. EXAM: PELVIS - 1-2 VIEW COMPARISON:  None. FINDINGS: The cortical margins of the bony pelvis are intact. No fracture. Pubic symphysis and sacroiliac joints are congruent. Both femoral heads are well-seated in the respective acetabula. Mild to moderate degenerative change of both  hips, right greater than left. Seminal vesicle and vascular calcifications. IMPRESSION: No evidence of pelvic fracture. Electronically Signed   By: Jeb Levering M.D.   On: 01/21/2016 23:55   Ct Head Wo Contrast  01/21/2016  CLINICAL DATA:  Lost balance and fell from a ladder while trimming a tree this afternoon. Trauma to the head and neck. Right occipital pain. EXAM: CT HEAD WITHOUT CONTRAST CT CERVICAL SPINE WITHOUT CONTRAST TECHNIQUE: Multidetector CT imaging of the head and cervical spine was performed following the standard protocol without intravenous contrast. Multiplanar CT image reconstructions of the  cervical spine were also generated. COMPARISON:  08/19/2013 FINDINGS: CT HEAD FINDINGS The brain has a normal appearance without evidence of malformation, atrophy, old or acute infarction, mass lesion, hemorrhage, hydrocephalus or extra-axial collection. The calvarium is unremarkable. The paranasal sinuses, middle ears and mastoids are clear. There is atherosclerotic calcification of the major vessels at the base of the brain. CT CERVICAL SPINE FINDINGS Alignment is normal. No fracture. No soft tissue swelling. Old ACDF C6-7 is solid. No adjacent segment degenerative disease. Mild osteoarthritis of the C1-2 articulation. IMPRESSION: Head CT: No acute or traumatic finding. Normal except for atherosclerotic calcification of the major vessels at the base of the brain. Cervical spine CT:  No acute or traumatic finding.  Old ACDF C6-7. Electronically Signed   By: Nelson Chimes M.D.   On: 01/21/2016 23:02   Ct Cervical Spine Wo Contrast  01/21/2016  CLINICAL DATA:  Lost balance and fell from a ladder while trimming a tree this afternoon. Trauma to the head and neck. Right occipital pain. EXAM: CT HEAD WITHOUT CONTRAST CT CERVICAL SPINE WITHOUT CONTRAST TECHNIQUE: Multidetector CT imaging of the head and cervical spine was performed following the standard protocol without intravenous contrast. Multiplanar CT  image reconstructions of the cervical spine were also generated. COMPARISON:  08/19/2013 FINDINGS: CT HEAD FINDINGS The brain has a normal appearance without evidence of malformation, atrophy, old or acute infarction, mass lesion, hemorrhage, hydrocephalus or extra-axial collection. The calvarium is unremarkable. The paranasal sinuses, middle ears and mastoids are clear. There is atherosclerotic calcification of the major vessels at the base of the brain. CT CERVICAL SPINE FINDINGS Alignment is normal. No fracture. No soft tissue swelling. Old ACDF C6-7 is solid. No adjacent segment degenerative disease. Mild osteoarthritis of the C1-2 articulation. IMPRESSION: Head CT: No acute or traumatic finding. Normal except for atherosclerotic calcification of the major vessels at the base of the brain. Cervical spine CT:  No acute or traumatic finding.  Old ACDF C6-7. Electronically Signed   By: Nelson Chimes M.D.   On: 01/21/2016 23:02   I have personally reviewed and evaluated these images and lab results as part of my medical decision-making.   EKG Interpretation None      MDM   Final diagnoses:  Fall from ladder, initial encounter  Headache, unspecified headache type  Neck pain  Back pain, unspecified location   60 year old male here after a fall from ladder approx 12 feet in the air. Patient with head injury but no loss of consciousness. He was able to annually after the fall across the street to neighbors house. Currently complains of headache, neck pain, and back pain. He denies any chest pain, shortness of breath, or abdominal pain. He is awake, alert, fully oriented to baseline. His vital signs are stable. Neurologic exam is nonfocal. Pelvis is stable, nontender, no leg shortening. No bruising of the chest or abdomen. Lungs are clear bilaterally. C-collar in place. Tenderness of the midline cervical and thoracic back without acute deformity. Lumbar spine is nontender.  Neurologic exam is nonfocal. No  deficits to suggest central cord syndrome. CT head and cervical spine obtained, no acute findings.  Thoracic spine with old T5 endplate compression deformity, unchanged.  CXR and pelvis films normal.  Tetanus is UTD.  Patient remains neurologically intact. Feel he is stable for discharge. Rx Percocet. Recommended to follow-up with PCP.  Discussed plan with patient, he/she acknowledged understanding and agreed with plan of care.  Return precautions given for new or worsening symptoms.  Case discussed with attending physician, Dr. Laneta Simmers, who evaluated patient and agrees with assessment and plan of care.  Larene Pickett, PA-C 01/22/16 0031  Leo Grosser, MD 01/22/16 [phone]

## 2016-01-21 NOTE — ED Notes (Signed)
Patient transported to X-ray 

## 2016-01-21 NOTE — ED Notes (Signed)
MD at bedside. 

## 2016-01-21 NOTE — ED Notes (Signed)
Patient returned from xray and CT.

## 2016-01-22 MED ORDER — OXYCODONE-ACETAMINOPHEN 5-325 MG PO TABS: 1.0000 | ORAL_TABLET | ORAL | Status: DC | PRN

## 2016-01-22 MED FILL — OXYCODONE/APAP 5/325MG: 5-325 | 3 days supply | Qty: 20 | Fill #0

## 2016-01-22 NOTE — Discharge Instructions (Signed)
Your imaging today was normal for any acute injuries. Take the prescribed medication as directed.  Use caution when taking this, it can make you sleepy/drowsy. Follow-up with your primary care doctor. Return to the ED for new or worsening symptoms.

## 2016-01-22 NOTE — ED Notes (Signed)
Patient Alert and oriented X4. Stable and ambulatory. Patient verbalized understanding of the discharge instructions.  Patient belongings were taken by the patient.  

## 2016-01-26 DIAGNOSIS — E1065 Type 1 diabetes mellitus with hyperglycemia: Secondary | ICD-10-CM | POA: Diagnosis not present

## 2016-01-29 ENCOUNTER — Other Ambulatory Visit (HOSPITAL_COMMUNITY): Payer: Self-pay | Admitting: Endocrinology

## 2016-01-29 ENCOUNTER — Ambulatory Visit (HOSPITAL_COMMUNITY)
Admission: EM | Admit: 2016-01-29 | Discharge: 2016-01-29 | Disposition: A | Discharge status: Home / Self Care | Payer: 59

## 2016-01-29 ENCOUNTER — Ambulatory Visit (HOSPITAL_COMMUNITY)
Admission: RE | Admit: 2016-01-29 | Discharge: 2016-01-29 | Disposition: A | Discharge status: Home / Self Care | Payer: 59 | Source: Ambulatory Visit | Attending: Endocrinology | Admitting: Endocrinology

## 2016-01-29 DIAGNOSIS — N08 Glomerular disorders in diseases classified elsewhere: Secondary | ICD-10-CM | POA: Diagnosis not present

## 2016-01-29 DIAGNOSIS — S299XXA Unspecified injury of thorax, initial encounter: Secondary | ICD-10-CM | POA: Diagnosis not present

## 2016-01-29 DIAGNOSIS — Z4681 Encounter for fitting and adjustment of insulin pump: Secondary | ICD-10-CM | POA: Diagnosis not present

## 2016-01-29 DIAGNOSIS — E1029 Type 1 diabetes mellitus with other diabetic kidney complication: Secondary | ICD-10-CM | POA: Diagnosis not present

## 2016-01-29 DIAGNOSIS — W11XXXA Fall on and from ladder, initial encounter: Secondary | ICD-10-CM | POA: Insufficient documentation

## 2016-01-29 DIAGNOSIS — R0781 Pleurodynia: Secondary | ICD-10-CM | POA: Insufficient documentation

## 2016-01-29 DIAGNOSIS — W19XXXA Unspecified fall, initial encounter: Secondary | ICD-10-CM

## 2016-02-01 ENCOUNTER — Emergency Department (HOSPITAL_COMMUNITY): Payer: 59

## 2016-02-01 ENCOUNTER — Encounter (HOSPITAL_COMMUNITY): Payer: Self-pay | Admitting: Emergency Medicine

## 2016-02-01 ENCOUNTER — Emergency Department (HOSPITAL_COMMUNITY)
Admission: EM | Admit: 2016-02-01 | Discharge: 2016-02-01 | Disposition: A | Discharge status: Home / Self Care | Payer: 59 | Attending: Emergency Medicine | Admitting: Emergency Medicine

## 2016-02-01 DIAGNOSIS — J45909 Unspecified asthma, uncomplicated: Secondary | ICD-10-CM | POA: Insufficient documentation

## 2016-02-01 DIAGNOSIS — J449 Chronic obstructive pulmonary disease, unspecified: Secondary | ICD-10-CM | POA: Diagnosis not present

## 2016-02-01 DIAGNOSIS — E785 Hyperlipidemia, unspecified: Secondary | ICD-10-CM | POA: Insufficient documentation

## 2016-02-01 DIAGNOSIS — Z791 Long term (current) use of non-steroidal anti-inflammatories (NSAID): Secondary | ICD-10-CM | POA: Diagnosis not present

## 2016-02-01 DIAGNOSIS — W11XXXA Fall on and from ladder, initial encounter: Secondary | ICD-10-CM | POA: Insufficient documentation

## 2016-02-01 DIAGNOSIS — Z794 Long term (current) use of insulin: Secondary | ICD-10-CM | POA: Insufficient documentation

## 2016-02-01 DIAGNOSIS — F329 Major depressive disorder, single episode, unspecified: Secondary | ICD-10-CM | POA: Diagnosis not present

## 2016-02-01 DIAGNOSIS — Z87891 Personal history of nicotine dependence: Secondary | ICD-10-CM | POA: Diagnosis not present

## 2016-02-01 DIAGNOSIS — Y999 Unspecified external cause status: Secondary | ICD-10-CM | POA: Insufficient documentation

## 2016-02-01 DIAGNOSIS — S2241XA Multiple fractures of ribs, right side, initial encounter for closed fracture: Secondary | ICD-10-CM | POA: Diagnosis not present

## 2016-02-01 DIAGNOSIS — Z8669 Personal history of other diseases of the nervous system and sense organs: Secondary | ICD-10-CM | POA: Insufficient documentation

## 2016-02-01 DIAGNOSIS — Y939 Activity, unspecified: Secondary | ICD-10-CM | POA: Insufficient documentation

## 2016-02-01 DIAGNOSIS — E119 Type 2 diabetes mellitus without complications: Secondary | ICD-10-CM | POA: Diagnosis not present

## 2016-02-01 DIAGNOSIS — Z79899 Other long term (current) drug therapy: Secondary | ICD-10-CM | POA: Diagnosis not present

## 2016-02-01 DIAGNOSIS — S279XXA Injury of unspecified intrathoracic organ, initial encounter: Secondary | ICD-10-CM | POA: Diagnosis not present

## 2016-02-01 DIAGNOSIS — R0781 Pleurodynia: Secondary | ICD-10-CM | POA: Diagnosis not present

## 2016-02-01 DIAGNOSIS — I2510 Atherosclerotic heart disease of native coronary artery without angina pectoris: Secondary | ICD-10-CM | POA: Insufficient documentation

## 2016-02-01 DIAGNOSIS — Y929 Unspecified place or not applicable: Secondary | ICD-10-CM | POA: Diagnosis not present

## 2016-02-01 DIAGNOSIS — S2231XA Fracture of one rib, right side, initial encounter for closed fracture: Secondary | ICD-10-CM

## 2016-02-01 MED ORDER — MORPHINE SULFATE (PF) 4 MG/ML IV SOLN
4.0000 mg | Freq: Once | INTRAVENOUS | Status: AC
Start: 2016-02-01 — End: 2016-02-01
  Administered 2016-02-01: 4 mg via INTRAVENOUS
  Filled 2016-02-01: qty 1

## 2016-02-01 MED ORDER — OXYCODONE HCL 5 MG PO TABS: 5.0000 mg | ORAL_TABLET | Freq: Four times a day (QID) | ORAL | Status: DC | PRN

## 2016-02-01 NOTE — ED Notes (Signed)
EDP at bedside  

## 2016-02-01 NOTE — ED Notes (Signed)
Bed: WA07 Expected date: 02/01/16 Expected time: 6:11 PM Means of arrival: Ambulance Comments: Fall

## 2016-02-01 NOTE — ED Provider Notes (Signed)
CSN: [id number]     Arrival date & time 02/01/16  1808 History   First MD Initiated Contact with Patient 02/01/16 1840     Chief Complaint  Patient presents with  . Rib Injury   (Consider location/radiation/quality/duration/timing/severity/associated sxs/prior Treatment) HPI 60 y.o. male with a hx of hyperlipidemia, anxiety, depression, asthma, type 1 diabetes, COPD, diabetic retinopathy, renal insufficiency, presents to the Emergency Department today complaining of right posterior rib pain since fall from 12 foot ladder on 01-22-16. No fractures noted on CXR. Additional imaging done on 01-29-16 via CXR with no new fractures. Pt worried that he has worsening symptoms. Noted coughing makes pain worse and sneezing as well. No new fevers. No N/V. No CP/SOB. Pt concerned for PNA. Has hx of previous. Pt pain 10/10 when coughing. No pain currently. No other symptoms noted.    Past Medical History  Diagnosis Date  . HYPERLIPIDEMIA 04/04/2007  . ANXIETY 04/03/2007  . DEPRESSION 04/03/2007  . ASTHMATIC BRONCHITIS, ACUTE 10/25/2008  . ASTHMA 09/06/2008  . ED (erectile dysfunction)   . Cervical disc disease   . Bladder neck obstruction   . COPD (chronic obstructive pulmonary disease) ([place])   . Spinal stenosis   . Depression   . Anxiety   . CORONARY ARTERY DISEASE 04/03/2007  . CARPAL TUNNEL SYNDROME, BILATERAL 07/31/2007    issues resolved, no surgery  . DIABETES MELLITUS, TYPE I 04/03/2007  . Diabetic retinopathy associated with diabetes mellitus due to underlying condition ([place]) 04/03/2007  . DM W/EYE MANIFESTATIONS, TYPE I, UNCONTROLLED 04/04/2007  . Pneumonia     "several times and again today" (02/13/2104)  . Chronic bronchitis ([place])     "get it about q yr" (02/12/2014)  . Seizures ([place])     "insulin seizure from time to time; none in the last couple years" (02/12/2014)  . Renal insufficiency   . DM W/RENAL MNFST, TYPE I, UNCONTROLLED 04/04/2007   Past Surgical History  Procedure Laterality Date  .  Anterior cervical decomp/discectomy fusion  2000    "couple screws and a plate"  . Appendectomy    . Stress cardiolite  09/06/2002  . Tonsillectomy    . Lymph node dissection  ~ 1960    groin  . Cystoscopy with retrograde pyelogram, ureteroscopy and stent placement Bilateral 04/06/2013    Procedure: BILATERAL CYSTOSCOPY WITH RETROGRADE PYELOGRAMS, STENT PLACEMENTS AND LEFT URETEROSCOPY AND STONE REMOVAL;  Surgeon: Alexis Frock, MD;  Location: [facility];  Service: Urology;  Laterality: Bilateral;  . Holmium laser application Left [id number]    Procedure: HOLMIUM LASER APPLICATION;  Surgeon: Alexis Frock, MD;  Location: [facility];  Service: Urology;  Laterality: Left;  . Cystoscopy/retrograde/ureteroscopy Bilateral 04/12/2013    Procedure: CYSTOSCOPY/RETROGRADE/URETEROSCOPY;  Surgeon: Alexis Frock, MD;  Location: [facility];  Service: Urology;  Laterality: Bilateral;  RIGHT RETROGRADE   . Cystoscopy with stent placement Right 04/12/2013    Procedure: CYSTOSCOPY WITH STENT PLACEMENT;  Surgeon: Alexis Frock, MD;  Location: [facility];  Service: Urology;  Laterality: Right;  . Back surgery    . Cardiac catheterization  1990's  . Vitrectomy Bilateral   . Cataract extraction w/ intraocular lens  implant, bilateral Bilateral    Family History  Problem Relation Age of Onset  . Stroke Father     strong FH cerebrovascular disease  . Diabetes Brother   . Heart disease Brother     CHF  . Colon cancer Neg Hx   . Cancer Mother    Social History  Substance Use Topics  .  Smoking status: Former Smoker -- 0.00 packs/Boom for 41 years    Types: Cigarettes    Quit date: 03/02/2015  . Smokeless tobacco: Never Used  . Alcohol Use: No    Review of Systems ROS reviewed and all are negative for acute change except as noted in the HPI.  Allergies  Augmentin; Iohexol; and Lexapro  Home Medications   Prior to Admission medications   Medication Sig Start Date End Date Taking? Authorizing Provider   acetaminophen (TYLENOL) 500 MG tablet Take 1,000 mg by mouth every 6 (six) hours as needed for moderate pain.     Historical Provider, MD  albuterol (VENTOLIN HFA) 108 (90 BASE) MCG/ACT inhaler Inhale 2 puffs into the lungs every 6 (six) hours as needed for wheezing or shortness of breath. 04/11/15   Tammy S Parrett, NP  budesonide-formoterol (SYMBICORT) 160-4.5 MCG/ACT inhaler Take 2 puffs first thing in am and then another 2 puffs about 12 hours later. Patient taking differently: Inhale 2 puffs into the lungs every 12 (twelve) hours.  04/04/15   Tanda Rockers, MD  cetirizine (ZYRTEC) 10 MG tablet Take 10 mg by mouth daily as needed for allergies.    Historical Provider, MD  CHANTIX CONTINUING MONTH PAK 1 MG tablet Take 1 mg by mouth 2 (two) times daily.  05/09/15   Historical Provider, MD  HYDROcodone-homatropine (HYDROMET) 5-1.5 MG/5ML syrup Take 5 mLs by mouth every 6 (six) hours as needed. Patient not taking: Reported on 12/29/2015 12/11/15   Fonnie Mu Parrett, NP  ibuprofen (ADVIL,MOTRIN) 200 MG tablet Take 400 mg by mouth every 6 (six) hours as needed for moderate pain.     Historical Provider, MD  Insulin Human (INSULIN PUMP) SOLN Inject into the skin continuous. Humalog 100 units/ml    Historical Provider, MD  levofloxacin (LEVAQUIN) 750 MG tablet Take 1 tablet (750 mg total) by mouth daily. 12/22/15   Rigoberto Noel, MD  oxyCODONE-acetaminophen (PERCOCET/ROXICET) 5-325 MG tablet Take 1 tablet by mouth every 4 (four) hours as needed. 01/22/16   Larene Pickett, PA-C  promethazine (PHENERGAN) 25 MG tablet Take 1 tablet (25 mg total) by mouth every 6 (six) hours as needed for nausea or vomiting. Patient not taking: Reported on 12/29/2015 12/12/15   Fonnie Mu Parrett, NP  pseudoephedrine (SUDAFED) 30 MG tablet Take 30 mg by mouth every 4 (four) hours as needed for congestion.    Historical Provider, MD  simvastatin (ZOCOR) 40 MG tablet Take 40 mg by mouth daily. 11/24/15   Historical Provider, MD  traZODone  (DESYREL) 150 MG tablet Take 150 mg by mouth at bedtime. 11/24/15   Historical Provider, MD   BP 156/75 mmHg  Pulse 82  Temp(Src) 98.2 F (36.8 C) (Oral)  Resp 18  SpO2 94%   Physical Exam  Constitutional: He is oriented to person, place, and time. He appears well-developed and well-nourished.  HENT:  Head: Normocephalic and atraumatic.  Eyes: EOM are normal. Pupils are equal, round, and reactive to light.  Neck: Normal range of motion. Neck supple. No tracheal deviation present.  Cardiovascular: Normal rate, regular rhythm, normal heart sounds and intact distal pulses.   No murmur heard. Pulmonary/Chest: Effort normal. No accessory muscle usage. No bradypnea. No respiratory distress. He has wheezes in the right upper field and the left upper field. He has no rales.  TTP posterior right rib space. No visible deformities. No palpable deformities.   Abdominal: Soft.  Musculoskeletal: Normal range of motion.  Neurological: He is alert  and oriented to person, place, and time.  Skin: Skin is warm and dry.  Psychiatric: He has a normal mood and affect. His behavior is normal. Thought content normal.  Nursing note and vitals reviewed.  ED Course  Procedures (including critical care time) Labs Review Labs Reviewed - No data to display  Imaging Review Ct Chest Wo Contrast  02/01/2016  CLINICAL DATA:  The patient fell last week and has right rib pain. EXAM: CT CHEST WITHOUT CONTRAST TECHNIQUE: Multidetector CT imaging of the chest was performed following the standard protocol without IV contrast. COMPARISON:  February 12, 2014 CT scan FINDINGS: The central airways are within limits. No pneumothorax. There is a 3 mm nodule in the right apex on series 5, image 35, not seen previously, possibly due to difference in slice selection. There is atelectasis in the left lung base. There is calcified granuloma on the left as well. There is a small nodule in the right upper lobe on series 5, image 37 measuring  3.8 mm. Again, this was not seen previously but the difference could be due to difference in slice selection. No suspicious masses or infiltrates. Atherosclerosis is seen in the non aneurysmal aorta. The central pulmonary arteries are normal. There are coronary artery calcifications. No effusions. The heart is normal in appearance. No adenopathy. Evaluation of the upper abdomen demonstrates a few tiny stones in the upper pole of the right kidney, incompletely characterized. No other abnormalities. There are fractures of the posterior right ninth and tenth ribs. There is a healed rib fracture seen on the left. No other acute abnormalities. IMPRESSION: 1. Acute fractures through the posterior right ninth and tenth ribs with no pneumothorax. 2. Tiny pulmonary nodules as described above. See below for follow-up recommendations. Electronically Signed   By: Dorise Bullion III M.D   On: 02/01/2016 20:55   I have personally reviewed and evaluated these images and lab results as part of my medical decision-making.   EKG Interpretation None      MDM  I have reviewed and evaluated the relevant laboratory values I have reviewed and evaluated the relevant imaging studies.  I have reviewed the relevant previous healthcare records. I obtained HPI from historian.  ED Course:  Assessment: Pt is a 59yM with hx hyperlipidemia, anxiety, depression, asthma, type 1 diabetes, COPD, diabetic retinopathy, renal insufficiency who presents with right posterior rib pain. On exam, pt in NAD. Nontoxic/nonseptic appearing. VSS. Afebrile. Lungs CTA. Heart RRR. TTP right posterior rib space. No visible/palpable deformities. Recent CXR on 01-29-16 with no evidence of PNA or fractures. CT Chest ordered to evaluate, which showed showed posterior 9th and 10th rib fractures. No infiltrate. No Pneumothorax. Given analgesia in ED. Reviewed [place] drug database and pt received Percocet #20 on 01-22-16. Pt notes that he is all out of medication.  Given spirometer in ED. Given Oxycodone #5. Counseled on NSAID use and Narcotic abuse. Low suspicion for PNA as patient is afebrile with lungs sounds unremarkable. Plan is to [place] with follow up to PCP for further pain management of rib fracture. At time of discharge, Patient is in no acute distress. Vital Signs are stable. Patient is able to ambulate. Patient able to tolerate PO.    Disposition/Plan:  DC Home Additional Verbal discharge instructions given and discussed with patient.  Pt Instructed to f/u with PCP in the next week for evaluation and treatment of symptoms. Return precautions given Pt acknowledges and agrees with plan  Supervising Physician Quintella Reichert, MD  Final diagnoses:  Rib fracture, right, closed, initial encounter     Shary Decamp, PA-C 02/01/16 2104  Quintella Reichert, MD 02/02/16 1451

## 2016-02-01 NOTE — ED Notes (Signed)
Pt fell last week and was seen for right rib pain following a fall. Pt's xray was negative for rib fractures. Pt states he sneezed once yesterday and once today and felt a "pop" in his ribs both times. Pt has swelling in posterior right rib area. Pt was given 250 mcg of fentanyl in route.

## 2016-02-01 NOTE — Discharge Instructions (Signed)
Please read and follow all provided instructions.  Your diagnoses today include:  1. Rib fracture, right, closed, initial encounter     Tests performed today include:  Vital signs. See below for your results today.   Medications prescribed:   Take as prescribed   You can use Ibuprofen 400mg  combined with Tylenol 1000mg  for pain relief every 6 hours. Do not exceed 4g of Tylenol in one 24 hour period. Use narcotics if pain uncontrolled with the aforementioned regiment. Do not exceed 10 days of this regiment.   You have been prescribed a narcotic medication on an "as needed" basis. Take only as prescribed. Do not drive, operate any machinery or make any important decisions while taking this medication as it is sedating. It may cause constipation take over the counter stool softeners or add fiber to your diet to treat this (Metamucil, Psyllium Fiber, Colace, Miralax) Further refills will need to be obtained from your primary care doctor and will not be prescribed through the Emergency Department. You will test positive on most drug tests while taking this medciation.   Home care instructions:  Follow any educational materials contained in this packet.  Follow-up instructions: Please follow-up with your primary care provider for further evaluation of symptoms and treatment   Return instructions:   Please return to the Emergency Department if you do not get better, if you get worse, or new symptoms OR  - Fever (temperature greater than 101.79F)  - Bleeding that does not stop with holding pressure to the area    -Severe pain (please note that you may be more sore the [place] after your accident)  - Chest Pain  - Difficulty breathing  - Severe nausea or vomiting  - Inability to tolerate food and liquids  - Passing out  - Skin becoming red around your wounds  - Change in mental status (confusion or lethargy)  - New numbness or weakness     Please return if you have any other emergent  concerns.  Additional Information:  Your vital signs today were: BP 156/75 mmHg   Pulse 82   Temp(Src) 98.2 F (36.8 C) (Oral)   Resp 18   SpO2 94% If your blood pressure (BP) was elevated above 135/85 this visit, please have this repeated by your doctor within one month. ---------------

## 2016-02-01 NOTE — ED Notes (Signed)
Pt requesting more pain medication.

## 2016-02-03 MED FILL — OXYCODONE/APAP 5/325MG: 5-325 | 30 days supply | Qty: 90 | Fill #0

## 2016-02-18 DIAGNOSIS — E113593 Type 2 diabetes mellitus with proliferative diabetic retinopathy without macular edema, bilateral: Secondary | ICD-10-CM | POA: Diagnosis not present

## 2016-02-18 DIAGNOSIS — N08 Glomerular disorders in diseases classified elsewhere: Secondary | ICD-10-CM | POA: Diagnosis not present

## 2016-02-18 DIAGNOSIS — I2510 Atherosclerotic heart disease of native coronary artery without angina pectoris: Secondary | ICD-10-CM | POA: Diagnosis not present

## 2016-02-18 DIAGNOSIS — E784 Other hyperlipidemia: Secondary | ICD-10-CM | POA: Diagnosis not present

## 2016-02-18 DIAGNOSIS — M898X8 Other specified disorders of bone, other site: Secondary | ICD-10-CM | POA: Diagnosis not present

## 2016-02-18 DIAGNOSIS — K3184 Gastroparesis: Secondary | ICD-10-CM | POA: Diagnosis not present

## 2016-02-18 DIAGNOSIS — D6489 Other specified anemias: Secondary | ICD-10-CM | POA: Diagnosis not present

## 2016-02-18 DIAGNOSIS — E1029 Type 1 diabetes mellitus with other diabetic kidney complication: Secondary | ICD-10-CM | POA: Diagnosis not present

## 2016-02-18 DIAGNOSIS — I7389 Other specified peripheral vascular diseases: Secondary | ICD-10-CM | POA: Diagnosis not present

## 2016-02-19 MED FILL — OXYCODONE/APAP 5/325MG: 5-325 | 30 days supply | Qty: 90 | Fill #0

## 2016-02-24 ENCOUNTER — Encounter: Payer: Self-pay | Admitting: Pharmacist

## 2016-02-24 ENCOUNTER — Other Ambulatory Visit: Payer: Self-pay | Admitting: Pharmacist

## 2016-02-24 MED FILL — SYMBICORT 160-4.5 MCG INH: 160-4.5 | 30 days supply | Qty: 10 | Fill #4

## 2016-02-24 MED FILL — traZODone HCL 150 MG TABS: 150 | 30 days supply | Qty: 30 | Fill #3

## 2016-02-24 NOTE — Patient Outreach (Addendum)
[name]) Care Management  [place]   02/24/2016  James [name] 1956-04-01 [id number]  Subjective: Patient presents today for 3 month diabetes follow-up as part of the employer-sponsored Link to Wellness program.  Current type 1 diabetes regimen includes Insulin Pump ([employer]tronic) with continuous blood glucose monitor.  Patient also continues on simvastatin.  Most recent MD follow-up was around 02/19/16 with Dr Forde Dandy.  Patient is followed by endocrinologist every 3 months.  Patient reports falling off a ladder on 01/21/16 which resulted in a rib fracture.  Today he states his pain is improving but he does still have limited mobility and cannot do strenuous activity such as exercising. He states he has recently lost his job and this has increased his stress level.   Patient reports hypoglycemic events around 2-3 times per week.  The events usually occur overnight at around 3-4 am.  He reports using the pump bolus wizard and only adds to correction when his blood glucose stays high.    Patient reported dietary habits: Eats only 1 meal/[place] on most days. Patient states he has been losing his appetite and does have some nausea with his oxycodone (not taking with food)  Patient reported exercise habits: limited due to rib fracture   Patient denies nocturia.  Patient denies pain/burning upon urination.  Patient denies neuropathy. Patient denies visual changes. Patient reports self foot exams.   Objective:  Lab Results  Component Value Date   HGBA1C 6.4 (H) 12/22/2015   14 [place] Average CBG: 148 mg/dL    Encounter Medications: Outpatient Encounter Prescriptions as of 02/24/2016  Medication Sig  . acetaminophen (TYLENOL) 500 MG tablet Take 1,000 mg by mouth every 6 (six) hours as needed for mild pain or headache.   . albuterol (VENTOLIN HFA) 108 (90 BASE) MCG/ACT inhaler Inhale 2 puffs into the lungs every 6 (six) hours as needed for wheezing or shortness of breath.  .  cetirizine (ZYRTEC) 10 MG tablet Take 10 mg by mouth daily as needed for allergies.  [name] ibuprofen (ADVIL,MOTRIN) 200 MG tablet Take 800 mg by mouth every 8 (eight) hours as needed for headache, mild pain or moderate pain.   . Insulin Human (INSULIN PUMP) SOLN Pt uses Humalog 100 units/mL.  [name] oxyCODONE (ROXICODONE) 5 MG immediate release tablet Take 1 tablet (5 mg total) by mouth every 6 (six) hours as needed for severe pain.  . promethazine (PHENERGAN) 25 MG tablet Take 1 tablet (25 mg total) by mouth every 6 (six) hours as needed for nausea or vomiting.  . pseudoephedrine (SUDAFED) 30 MG tablet Take 30 mg by mouth every 4 (four) hours as needed for congestion.  . simvastatin (ZOCOR) 40 MG tablet Take 40 mg by mouth at bedtime.   . traZODone (DESYREL) 150 MG tablet Take 150 mg by mouth at bedtime.  . budesonide-formoterol (SYMBICORT) 160-4.5 MCG/ACT inhaler Inhale 2 puffs into the lungs 2 (two) times daily.  . [DISCONTINUED] HYDROcodone-homatropine (HYDROMET) 5-1.5 MG/5ML syrup Take 5 mLs by mouth every 6 (six) hours as needed. (Patient not taking: Reported on 12/29/2015)  . [DISCONTINUED] levofloxacin (LEVAQUIN) 750 MG tablet Take 1 tablet (750 mg total) by mouth daily. (Patient not taking: Reported on 02/01/2016)   No facility-administered encounter medications on file as of 02/24/2016.     Functional Status: In your present state of health, do you have any difficulty performing the following activities: 02/24/2016 12/22/2015  Hearing? Tempie Donning  Vision? N N  Difficulty concentrating or making decisions? N  N  Walking or climbing stairs? N N  Dressing or bathing? N N  Doing errands, shopping? N N  Some recent data might be hidden    Fall/Depression Screening: PHQ 2/9 Scores 02/24/2016 11/26/2014  PHQ - 2 Score 0 1    Assessment:  Diabetes: Most recent A1C was 6.4% which is at at goal of less than 7%. Weight is increased from last visit with the  Link to Wellness program.  Patient reports using the  bolus wizard on his pump and only adds to the recommended bolus when his blood glucose stays high. Currently only changing his infusion sets every 3 to 5 days. Reports nocturnal hypoglycemia 2-3 times per week.   Lifestyle improvements: Physical Activity- Exercise limited by rib fracture status post fall from ladder on 01/21/16. Patient plans to start walking once his recovery of 6-8 weeks is complete  Nutrition- Currently only eating 1 meal per [place] which may be leading to more frequent hypoglycemia  COPD: Currently non-adherent to his Symbicort.  States he does have some shortness of breath occasionally.   Plan/Goals for Next Visit: -Counseled on signs/symptoms and treatment of hypoglycemia using the rule of 15s -Counseled on importance of changing infusion sets to prevent variable insulin absorption. Instructed patient to make sure he changes his infusion sets every 3 days and to change the location of the sites -Discussed importance of eating more than 1 meal per [place] to reduce risk of hypoglycemia.  Counseled on low carbohydrate diet. Patient set goal to eat more small meals throughout the [place].  -Patient plans to schedule annual eye exam this week -Counseled on adherence with his COPD medications. Patient will refill Symbicort today  Patient will followup with Ralph Dowdy, RN in 3 to 6 months.   Bennye Alm, PharmD [facility] PGY2 Pharmacy Resident [phone]  [facility] CM Care Plan Problem One   Flowsheet Row Most Recent Value  Care Plan Problem One  Knowledge deficit of causes of hypoglycemia as evidenced by daily nutrition and hypoglycemic episodes  Role Documenting the Problem One  Clinical Pharmacist  Care Plan for Problem One  Active  [facility] Long Term Goal (31-90 days)  Reduced hypoglycemic episodes over the next 90 days as evidenced by patient report and continous blood glucose report  [facility] Long Term Goal Start Date  02/24/16  Interventions for Problem One Long Term Goal  Reviewed treatment of  hypoglycemia using rule of 15s, instructed patient to utilize bolus wizard on his pump, counseled on importance of changing his infusion sets to prevent variable insulin absorption, and counseled on the need for patient to eat more small meals throughout the [place] to prevent hypoglycemia  [facility] CM Short Term Goal #1 (0-30 days)  Patient to increase the number of meals he eats over the next 30 days to reduce the risk of hypoglycemia  [facility] CM Short Term Goal #1 Start Date  02/24/16  Interventions for Short Term Goal #1  Counseled on causes of hypoglycemia and strategies to reduce their reoccurence including eating regular meals.  Discussed low carbohydrate diet and portion sizes

## 2016-03-18 ENCOUNTER — Ambulatory Visit: Payer: 59 | Admitting: Pulmonary Disease

## 2016-04-02 MED FILL — traZODone HCL 150 MG TABS: 150 | 30 days supply | Qty: 30 | Fill #4

## 2016-04-02 MED FILL — SIMVASTATIN 40 MG TABLET: 40 | 60 days supply | Qty: 60 | Fill #2

## 2016-04-05 MED FILL — CHANTIX STARTING MONTH BOX: 0.5 MG X 11 | 28 days supply | Qty: 53 | Fill #0

## 2016-04-26 DIAGNOSIS — Z6826 Body mass index (BMI) 26.0-26.9, adult: Secondary | ICD-10-CM | POA: Diagnosis not present

## 2016-04-26 DIAGNOSIS — R202 Paresthesia of skin: Secondary | ICD-10-CM | POA: Diagnosis not present

## 2016-04-26 DIAGNOSIS — E1029 Type 1 diabetes mellitus with other diabetic kidney complication: Secondary | ICD-10-CM | POA: Diagnosis not present

## 2016-04-27 ENCOUNTER — Other Ambulatory Visit: Payer: Self-pay | Admitting: Endocrinology

## 2016-04-27 ENCOUNTER — Ambulatory Visit
Admission: RE | Admit: 2016-04-27 | Discharge: 2016-04-27 | Disposition: A | Discharge status: Home / Self Care | Payer: 59 | Source: Ambulatory Visit | Attending: Endocrinology | Admitting: Endocrinology

## 2016-04-27 DIAGNOSIS — R202 Paresthesia of skin: Secondary | ICD-10-CM

## 2016-04-27 DIAGNOSIS — M4802 Spinal stenosis, cervical region: Secondary | ICD-10-CM | POA: Diagnosis not present

## 2016-04-27 MED ORDER — GADOBENATE DIMEGLUMINE 529 MG/ML IV SOLN
16.0000 mL | Freq: Once | INTRAVENOUS | Status: AC | PRN
  Administered 2016-04-27: 16 mL via INTRAVENOUS

## 2016-04-28 ENCOUNTER — Other Ambulatory Visit: Payer: Self-pay | Admitting: Endocrinology

## 2016-04-28 DIAGNOSIS — R202 Paresthesia of skin: Secondary | ICD-10-CM

## 2016-05-07 DIAGNOSIS — E1065 Type 1 diabetes mellitus with hyperglycemia: Secondary | ICD-10-CM | POA: Diagnosis not present

## 2016-05-12 ENCOUNTER — Telehealth: Payer: Self-pay

## 2016-05-12 NOTE — Patient Outreach (Signed)
Called Mr. Gettel to schedule a Link to Wellness follow up appointment with Kelli Churn RN.  There was no answer left message for him to call me back.

## 2016-05-14 ENCOUNTER — Other Ambulatory Visit: Payer: Self-pay | Admitting: Neurosurgery

## 2016-05-14 DIAGNOSIS — G5603 Carpal tunnel syndrome, bilateral upper limbs: Secondary | ICD-10-CM | POA: Diagnosis not present

## 2016-05-14 DIAGNOSIS — M4722 Other spondylosis with radiculopathy, cervical region: Secondary | ICD-10-CM | POA: Diagnosis not present

## 2016-05-14 DIAGNOSIS — M5030 Other cervical disc degeneration, unspecified cervical region: Secondary | ICD-10-CM | POA: Diagnosis not present

## 2016-05-14 DIAGNOSIS — Z6828 Body mass index (BMI) 28.0-28.9, adult: Secondary | ICD-10-CM | POA: Diagnosis not present

## 2016-05-14 DIAGNOSIS — M542 Cervicalgia: Secondary | ICD-10-CM | POA: Diagnosis not present

## 2016-05-14 DIAGNOSIS — M5020 Other cervical disc displacement, unspecified cervical region: Secondary | ICD-10-CM | POA: Diagnosis not present

## 2016-05-14 MED FILL — traZODone HCL 150 MG TABS: 150 | 30 days supply | Qty: 30 | Fill #5

## 2016-05-14 MED FILL — CONTOUR NEXT STRIPS: 87 days supply | Qty: 700 | Fill #1

## 2016-05-14 MED FILL — CHANTIX 1 MG CONT MONTH BOX: 1 | 28 days supply | Qty: 56 | Fill #4

## 2016-05-20 ENCOUNTER — Emergency Department (HOSPITAL_COMMUNITY): Payer: 59

## 2016-05-20 ENCOUNTER — Observation Stay (HOSPITAL_COMMUNITY)
Admission: EM | Admit: 2016-05-20 | Discharge: 2016-05-21 | Disposition: A | Discharge status: Home / Self Care | Payer: 59 | Attending: Family Medicine | Admitting: Family Medicine

## 2016-05-20 ENCOUNTER — Encounter (HOSPITAL_COMMUNITY): Payer: Self-pay | Admitting: Emergency Medicine

## 2016-05-20 DIAGNOSIS — Z79899 Other long term (current) drug therapy: Secondary | ICD-10-CM | POA: Insufficient documentation

## 2016-05-20 DIAGNOSIS — E1043 Type 1 diabetes mellitus with diabetic autonomic (poly)neuropathy: Secondary | ICD-10-CM | POA: Insufficient documentation

## 2016-05-20 DIAGNOSIS — E103519 Type 1 diabetes mellitus with proliferative diabetic retinopathy with macular edema, unspecified eye: Secondary | ICD-10-CM | POA: Insufficient documentation

## 2016-05-20 DIAGNOSIS — Z794 Long term (current) use of insulin: Secondary | ICD-10-CM | POA: Insufficient documentation

## 2016-05-20 DIAGNOSIS — Z720 Tobacco use: Secondary | ICD-10-CM | POA: Diagnosis present

## 2016-05-20 DIAGNOSIS — E10649 Type 1 diabetes mellitus with hypoglycemia without coma: Secondary | ICD-10-CM | POA: Diagnosis not present

## 2016-05-20 DIAGNOSIS — R031 Nonspecific low blood-pressure reading: Secondary | ICD-10-CM | POA: Diagnosis not present

## 2016-05-20 DIAGNOSIS — J40 Bronchitis, not specified as acute or chronic: Secondary | ICD-10-CM | POA: Diagnosis not present

## 2016-05-20 DIAGNOSIS — M5010 Cervical disc disorder with radiculopathy, unspecified cervical region: Secondary | ICD-10-CM | POA: Insufficient documentation

## 2016-05-20 DIAGNOSIS — Z9641 Presence of insulin pump (external) (internal): Secondary | ICD-10-CM | POA: Diagnosis not present

## 2016-05-20 DIAGNOSIS — I959 Hypotension, unspecified: Secondary | ICD-10-CM | POA: Diagnosis present

## 2016-05-20 DIAGNOSIS — I2510 Atherosclerotic heart disease of native coronary artery without angina pectoris: Secondary | ICD-10-CM | POA: Diagnosis not present

## 2016-05-20 DIAGNOSIS — R55 Syncope and collapse: Principal | ICD-10-CM | POA: Insufficient documentation

## 2016-05-20 DIAGNOSIS — E11311 Type 2 diabetes mellitus with unspecified diabetic retinopathy with macular edema: Secondary | ICD-10-CM | POA: Diagnosis present

## 2016-05-20 DIAGNOSIS — F17210 Nicotine dependence, cigarettes, uncomplicated: Secondary | ICD-10-CM | POA: Diagnosis not present

## 2016-05-20 DIAGNOSIS — K3184 Gastroparesis: Secondary | ICD-10-CM | POA: Diagnosis not present

## 2016-05-20 DIAGNOSIS — Z7951 Long term (current) use of inhaled steroids: Secondary | ICD-10-CM | POA: Diagnosis not present

## 2016-05-20 DIAGNOSIS — E162 Hypoglycemia, unspecified: Secondary | ICD-10-CM

## 2016-05-20 DIAGNOSIS — E1165 Type 2 diabetes mellitus with hyperglycemia: Secondary | ICD-10-CM

## 2016-05-20 DIAGNOSIS — IMO0002 Reserved for concepts with insufficient information to code with codable children: Secondary | ICD-10-CM | POA: Diagnosis present

## 2016-05-20 DIAGNOSIS — J449 Chronic obstructive pulmonary disease, unspecified: Secondary | ICD-10-CM | POA: Insufficient documentation

## 2016-05-20 DIAGNOSIS — E103513 Type 1 diabetes mellitus with proliferative diabetic retinopathy with macular edema, bilateral: Secondary | ICD-10-CM

## 2016-05-20 DIAGNOSIS — R05 Cough: Secondary | ICD-10-CM | POA: Diagnosis not present

## 2016-05-20 DIAGNOSIS — R0602 Shortness of breath: Secondary | ICD-10-CM | POA: Diagnosis not present

## 2016-05-20 DIAGNOSIS — E785 Hyperlipidemia, unspecified: Secondary | ICD-10-CM | POA: Diagnosis not present

## 2016-05-20 LAB — URINALYSIS, ROUTINE W REFLEX MICROSCOPIC
Bilirubin Urine: NEGATIVE
GLUCOSE, UA: 500 mg/dL — AB
HGB URINE DIPSTICK: NEGATIVE
KETONES UR: 15 mg/dL — AB
Leukocytes, UA: NEGATIVE
Nitrite: NEGATIVE
PROTEIN: NEGATIVE mg/dL
Specific Gravity, Urine: 1.009 (ref 1.005–1.030)
pH: 6 (ref 5.0–8.0)

## 2016-05-20 LAB — COMPREHENSIVE METABOLIC PANEL
ALT: 25 U/L (ref 17–63)
AST: 21 U/L (ref 15–41)
Albumin: 3.8 g/dL (ref 3.5–5.0)
Alkaline Phosphatase: 92 U/L (ref 38–126)
Anion gap: 6 (ref 5–15)
BUN: 14 mg/dL (ref 6–20)
CHLORIDE: 108 mmol/L (ref 101–111)
CO2: 23 mmol/L (ref 22–32)
CREATININE: 0.92 mg/dL (ref 0.61–1.24)
Calcium: 8.2 mg/dL — ABNORMAL LOW (ref 8.9–10.3)
GFR calc Af Amer: >60 mL/min (ref >60)
GFR calc non Af Amer: >60 mL/min (ref >60)
GLUCOSE: 172 mg/dL — AB (ref 65–99)
Potassium: 3.9 mmol/L (ref 3.5–5.1)
SODIUM: 137 mmol/L (ref 135–145)
Total Bilirubin: 0.8 mg/dL (ref 0.3–1.2)
Total Protein: 6 g/dL — ABNORMAL LOW (ref 6.5–8.1)

## 2016-05-20 LAB — CBC
HCT: 38.8 % — ABNORMAL LOW (ref 39.0–52.0)
Hemoglobin: 13.9 g/dL (ref 13.0–17.0)
MCH: 32.6 pg (ref 26.0–34.0)
MCHC: 35.8 g/dL (ref 30.0–36.0)
MCV: 90.9 fL (ref 78.0–100.0)
PLATELETS: 200 10*3/uL (ref 150–400)
RBC: 4.27 MIL/uL (ref 4.22–5.81)
RDW: 12.3 % (ref 11.5–15.5)
WBC: 6.6 10*3/uL (ref 4.0–10.5)

## 2016-05-20 LAB — CBG MONITORING, ED: Glucose-Capillary: 215 mg/dL — ABNORMAL HIGH (ref 65–99)

## 2016-05-20 LAB — INFLUENZA PANEL BY PCR (TYPE A & B)
INFLAPCR: NEGATIVE
Influenza B By PCR: NEGATIVE

## 2016-05-20 LAB — GLUCOSE, CAPILLARY
GLUCOSE-CAPILLARY: 86 mg/dL (ref 65–99)
Glucose-Capillary: 181 mg/dL — ABNORMAL HIGH (ref 65–99)

## 2016-05-20 LAB — I-STAT TROPONIN, ED
Troponin i, poc: 0.01 ng/mL (ref 0.00–0.08)
Troponin i, poc: 0.01 ng/mL (ref 0.00–0.08)

## 2016-05-20 LAB — I-STAT CG4 LACTIC ACID, ED: Lactic Acid, Venous: 1.4 mmol/L (ref 0.5–1.9)

## 2016-05-20 LAB — D-DIMER, QUANTITATIVE (NOT AT ARMC): D DIMER QUANT: 0.38 ug{FEU}/mL (ref 0.00–0.50)

## 2016-05-20 LAB — LIPASE, BLOOD: Lipase: 24 U/L (ref 11–51)

## 2016-05-20 MED ORDER — SODIUM CHLORIDE 0.9 % IV SOLN
1000.0000 mL | Freq: Once | INTRAVENOUS | Status: AC
  Administered 2016-05-20: 1000 mL via INTRAVENOUS

## 2016-05-20 MED ORDER — ONDANSETRON HCL 4 MG PO TABS: 4.0000 mg | ORAL_TABLET | Freq: Four times a day (QID) | ORAL | Status: DC | PRN

## 2016-05-20 MED ORDER — TRAZODONE HCL 50 MG PO TABS
150.0000 mg | ORAL_TABLET | Freq: Every day | ORAL | Status: DC
  Administered 2016-05-20: 150 mg via ORAL
  Filled 2016-05-20: qty 3

## 2016-05-20 MED ORDER — INSULIN ASPART 100 UNIT/ML ~~LOC~~ SOLN: 0.0000 [IU] | Freq: Three times a day (TID) | SUBCUTANEOUS | Status: DC

## 2016-05-20 MED ORDER — AZITHROMYCIN 250 MG PO TABS
500.0000 mg | ORAL_TABLET | Freq: Every day | ORAL | Status: DC
  Administered 2016-05-20 – 2016-05-21 (×2): 500 mg via ORAL
  Filled 2016-05-20 (×3): qty 2

## 2016-05-20 MED ORDER — OXYCODONE HCL 5 MG PO TABS
5.0000 mg | ORAL_TABLET | ORAL | Status: DC | PRN
Start: 2016-05-20 — End: 2016-05-21
  Administered 2016-05-20 – 2016-05-21 (×3): 5 mg via ORAL
  Filled 2016-05-20 (×3): qty 1

## 2016-05-20 MED ORDER — SODIUM CHLORIDE 0.9 % IV SOLN
INTRAVENOUS | Status: DC
  Administered 2016-05-20 – 2016-05-21 (×2): via INTRAVENOUS

## 2016-05-20 MED ORDER — ONDANSETRON HCL 4 MG/2ML IJ SOLN
4.0000 mg | Freq: Four times a day (QID) | INTRAMUSCULAR | Status: DC | PRN
Start: 2016-05-20 — End: 2016-05-21

## 2016-05-20 MED ORDER — POLYETHYLENE GLYCOL 3350 17 G PO PACK: 17.0000 g | PACK | Freq: Every day | ORAL | Status: DC | PRN

## 2016-05-20 MED ORDER — PSEUDOEPHEDRINE HCL 30 MG PO TABS
30.0000 mg | ORAL_TABLET | ORAL | Status: DC | PRN
  Filled 2016-05-20: qty 1

## 2016-05-20 MED ORDER — SODIUM CHLORIDE 0.9 % IV SOLN
1000.0000 mL | INTRAVENOUS | Status: DC
  Administered 2016-05-20: 1000 mL via INTRAVENOUS

## 2016-05-20 MED ORDER — SODIUM CHLORIDE 0.9 % IV SOLN
1000.0000 mL | Freq: Once | INTRAVENOUS | Status: AC
Start: 2016-05-20 — End: 2016-05-20
  Administered 2016-05-20: 1000 mL via INTRAVENOUS

## 2016-05-20 MED ORDER — MOMETASONE FURO-FORMOTEROL FUM 200-5 MCG/ACT IN AERO
2.0000 | INHALATION_SPRAY | Freq: Two times a day (BID) | RESPIRATORY_TRACT | Status: DC
  Administered 2016-05-20 – 2016-05-21 (×2): 2 via RESPIRATORY_TRACT
  Filled 2016-05-20: qty 8.8

## 2016-05-20 MED ORDER — KETOROLAC TROMETHAMINE 30 MG/ML IJ SOLN
15.0000 mg | Freq: Once | INTRAMUSCULAR | Status: AC
  Administered 2016-05-20: 15 mg via INTRAVENOUS
  Filled 2016-05-20: qty 1

## 2016-05-20 MED ORDER — ACETAMINOPHEN 500 MG PO TABS: 1000.0000 mg | ORAL_TABLET | Freq: Four times a day (QID) | ORAL | Status: DC | PRN

## 2016-05-20 MED ORDER — LEVALBUTEROL HCL 0.63 MG/3ML IN NEBU
0.6300 mg | INHALATION_SOLUTION | Freq: Four times a day (QID) | RESPIRATORY_TRACT | Status: DC
  Administered 2016-05-20 – 2016-05-21 (×2): 0.63 mg via RESPIRATORY_TRACT
  Filled 2016-05-20 (×2): qty 3

## 2016-05-20 MED ORDER — GABAPENTIN 100 MG PO CAPS
100.0000 mg | ORAL_CAPSULE | Freq: Three times a day (TID) | ORAL | Status: DC
  Administered 2016-05-20 – 2016-05-21 (×3): 100 mg via ORAL
  Filled 2016-05-20 (×3): qty 1

## 2016-05-20 MED ORDER — SIMVASTATIN 40 MG PO TABS
40.0000 mg | ORAL_TABLET | Freq: Every day | ORAL | Status: DC
  Administered 2016-05-20: 40 mg via ORAL
  Filled 2016-05-20: qty 1

## 2016-05-20 MED ORDER — LORATADINE 10 MG PO TABS
10.0000 mg | ORAL_TABLET | Freq: Every day | ORAL | Status: DC
  Administered 2016-05-20 – 2016-05-21 (×2): 10 mg via ORAL
  Filled 2016-05-20 (×2): qty 1

## 2016-05-20 MED ORDER — SODIUM CHLORIDE 0.9% FLUSH: 3.0000 mL | Freq: Two times a day (BID) | INTRAVENOUS | Status: DC

## 2016-05-20 MED ORDER — SALINE SPRAY 0.65 % NA SOLN
1.0000 | NASAL | Status: DC | PRN
  Filled 2016-05-20: qty 44

## 2016-05-20 MED ORDER — ALBUTEROL SULFATE (2.5 MG/3ML) 0.083% IN NEBU: 3.0000 mL | INHALATION_SOLUTION | Freq: Four times a day (QID) | RESPIRATORY_TRACT | Status: DC | PRN

## 2016-05-20 MED ORDER — ENOXAPARIN SODIUM 40 MG/0.4ML ~~LOC~~ SOLN
40.0000 mg | SUBCUTANEOUS | Status: DC
  Filled 2016-05-20: qty 0.4

## 2016-05-20 NOTE — ED Notes (Signed)
Pt again notified of need for urine sample. Pt states he is still unable to void at this time.

## 2016-05-20 NOTE — ED Triage Notes (Signed)
Patient is from home.  Patient has wheezing and hypotension.  Type 1 diabetic.  Has a insulin pump.  One episode of n/v.  Given 4 mg of Zofran by EMS   Initial  CBG was 118 and repeat was 142  BP:  Initially 62/28  Now with 500 ml of fluids 110/52 HR: 78-86  O2: 94-97 room air and nebulizer  Temp:  96.8 typamic  10 of albuterol and 0.5 [place] given by EMS

## 2016-05-20 NOTE — ED Notes (Signed)
14:20 to floor. 

## 2016-05-20 NOTE — Plan of Care (Signed)
Problem: Safety: Goal: Ability to remain free from injury will improve Outcome: Completed/Met Date Met: 05/20/16 Patient is aware of safety prevention precaution plan, bed alarm set

## 2016-05-20 NOTE — ED Notes (Signed)
First I-stat troponin was drawn at 10:36, waiting for appropiate time frame to draw next one

## 2016-05-20 NOTE — ED Notes (Signed)
Pt coughing up lots of mucus like substance

## 2016-05-20 NOTE — ED Notes (Signed)
Bed: [id number] Expected date:  Expected time:  Means of arrival:  Comments: EMS,hypotension/sepsis

## 2016-05-20 NOTE — ED Provider Notes (Signed)
[facility] DEPT Provider Note   CSN: [id number] Arrival date & time: 05/20/16  0754     History   Chief Complaint Chief Complaint  Patient presents with  . Hypotension    HPI James [name] is a 60 y.o. male.  HPI  Pt felt like he was developing a cold on Monday.   Mild sore throat and he was having lots of rhinitis.  He was having a pain, throbbing sensation in his left arm throughout the night.  He does have a herniated disc in his neck causing this problem, this is not new.  Pt woke up this am.  He felt very weak.  He called his wife for help and asked for a coke.  He thought like his sugar was low.  He was able to drink a coke at home but still felt bad so he called his wife.   When she went to check on him he initially tried to drink something but he became unresponsive and he would only groan.  No seizure activity but kept on groaning.  Wife did not have a chance to check the blood sugar and called EMS.   When EMS arrived it was 118.  Blood pressure was low in the 80s.  His meter registered a blood sugar of 48.  Pt was eventually able to communicate with EMS but he felt very weak.  Pt remembers his wife giving him something to drink and then remembers EMS arriving.  He denies chest pain.  Continues to have some cough.  No known fevers.   Past Medical History:  Diagnosis Date  . ANXIETY 04/03/2007  . Anxiety   . ASTHMA 09/06/2008  . ASTHMATIC BRONCHITIS, ACUTE 10/25/2008  . Bladder neck obstruction   . CARPAL TUNNEL SYNDROME, BILATERAL 07/31/2007   issues resolved, no surgery  . Cervical disc disease   . Chronic bronchitis ([place])    "get it about q yr" (02/12/2014)  . COPD (chronic obstructive pulmonary disease) ([place])   . CORONARY ARTERY DISEASE 04/03/2007  . DEPRESSION 04/03/2007  . Depression   . DIABETES MELLITUS, TYPE I 04/03/2007  . Diabetic retinopathy associated with diabetes mellitus due to underlying condition ([place]) 04/03/2007  . DM W/EYE MANIFESTATIONS, TYPE I,  UNCONTROLLED 04/04/2007  . 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 ED (erectile dysfunction)   . HYPERLIPIDEMIA 04/04/2007  . Pneumonia    "several times and again today" (02/13/2104)  . Renal insufficiency   . Seizures ([place])    "insulin seizure from time to time; none in the last couple years" (02/12/2014)  . Spinal stenosis     Patient Active Problem List   Diagnosis Date Noted  . CAP (community acquired pneumonia) 12/22/2015  . Community acquired pneumonia   . Gastroparesis 02/10/2011  . Esophageal reflux 12/21/2010  . TOBACCO USE, QUIT 05/08/2010  . CONTUSION, RIGHT CHEST WALL 04/07/2010  . Chronic asthmatic bronchitis ([place]) 10/25/2008  . ASTHMA 09/06/2008  . COUGH 09/06/2008  . SHOULDER PAIN, RIGHT 08/19/2008  . BLADDER OUTLET OBSTRUCTION 04/15/2008  . RASH AND OTHER NONSPECIFIC SKIN ERUPTION 02/21/2008  . CARPAL TUNNEL SYNDROME, BILATERAL 07/31/2007  . ALLERGIC RHINITIS 07/31/2007  . RENAL INSUFFICIENCY 07/31/2007  . OTHER TENOSYNOVITIS OF HAND AND WRIST 07/31/2007  . 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 DM W/EYE MANIFESTATIONS, TYPE I, UNCONTROLLED 04/04/2007  . HLD (hyperlipidemia) 04/04/2007  . Insulin dependent diabetes mellitus ([place]) 04/03/2007  . DIABETIC RETINOPATHY, PROLIFERATIVE 04/03/2007  . ANXIETY 04/03/2007  .  DEPRESSION 04/03/2007  . 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 CAD (coronary artery disease) 04/03/2007  . DYSHIDROSIS 04/03/2007    Past Surgical History:  Procedure Laterality Date  . ANTERIOR CERVICAL DECOMP/DISCECTOMY FUSION  2000   "couple screws and a plate"  . APPENDECTOMY    . BACK SURGERY    . CARDIAC CATHETERIZATION  1990's  . CATARACT EXTRACTION W/ INTRAOCULAR LENS  IMPLANT, BILATERAL Bilateral   . CYSTOSCOPY WITH RETROGRADE PYELOGRAM, URETEROSCOPY AND STENT PLACEMENT Bilateral 04/06/2013   Procedure: BILATERAL CYSTOSCOPY WITH RETROGRADE PYELOGRAMS, STENT PLACEMENTS AND LEFT URETEROSCOPY AND STONE REMOVAL;  Surgeon:  Alexis Frock, MD;  Location: [facility];  Service: Urology;  Laterality: Bilateral;  . CYSTOSCOPY WITH STENT PLACEMENT Right 04/12/2013   Procedure: CYSTOSCOPY WITH STENT PLACEMENT;  Surgeon: Alexis Frock, MD;  Location: [facility];  Service: Urology;  Laterality: Right;  . CYSTOSCOPY/RETROGRADE/URETEROSCOPY Bilateral 04/12/2013   Procedure: CYSTOSCOPY/RETROGRADE/URETEROSCOPY;  Surgeon: Alexis Frock, MD;  Location: [facility];  Service: Urology;  Laterality: Bilateral;  RIGHT RETROGRADE   . HOLMIUM LASER APPLICATION Left [id number]   Procedure: HOLMIUM LASER APPLICATION;  Surgeon: Alexis Frock, MD;  Location: [facility];  Service: Urology;  Laterality: Left;  . LYMPH NODE DISSECTION  ~ 1960   groin  . stress cardiolite  09/06/2002  . TONSILLECTOMY    . VITRECTOMY Bilateral        Home Medications    Prior to Admission medications   Medication Sig Start Date End Date Taking? Authorizing Provider  acetaminophen (TYLENOL) 500 MG tablet Take 1,000 mg by mouth every 6 (six) hours as needed for mild pain or headache.    Yes Historical Provider, MD  albuterol (VENTOLIN HFA) 108 (90 BASE) MCG/ACT inhaler Inhale 2 puffs into the lungs every 6 (six) hours as needed for wheezing or shortness of breath. 04/11/15  Yes Tammy S Parrett, NP  cetirizine (ZYRTEC) 10 MG tablet Take 10 mg by mouth daily as needed for allergies.   Yes Historical Provider, MD  ibuprofen (ADVIL,MOTRIN) 200 MG tablet Take 800 mg by mouth every 8 (eight) hours as needed for headache, mild pain or moderate pain.    Yes Historical Provider, MD  Insulin Human (INSULIN PUMP) SOLN Pt uses Humalog 100 units/mL.   Yes Historical Provider, MD  pseudoephedrine (SUDAFED) 30 MG tablet Take 30 mg by mouth every 4 (four) hours as needed for congestion.   Yes Historical Provider, MD  simvastatin (ZOCOR) 40 MG tablet Take 40 mg by mouth at bedtime.    Yes Historical Provider, MD  traZODone (DESYREL) 150 MG tablet Take 150 mg by mouth at bedtime.   Yes  Historical Provider, MD  budesonide-formoterol (SYMBICORT) 160-4.5 MCG/ACT inhaler Inhale 2 puffs into the lungs 2 (two) times daily.    Historical Provider, MD  oxyCODONE (ROXICODONE) 5 MG immediate release tablet Take 1 tablet (5 mg total) by mouth every 6 (six) hours as needed for severe pain. Patient not taking: Reported on 05/20/2016 02/01/16   Shary Decamp, PA-C  promethazine (PHENERGAN) 25 MG tablet Take 1 tablet (25 mg total) by mouth every 6 (six) hours as needed for nausea or vomiting. Patient not taking: Reported on 05/20/2016 12/12/15   Melvenia Needles, NP    Family History Family History  Problem Relation Age of Onset  . Stroke Father     strong FH cerebrovascular disease  . Diabetes Brother   . Heart disease Brother     CHF  . Cancer Mother   . Colon cancer Neg Hx  Social History Social History  Substance Use Topics  . Smoking status: Former Smoker    Packs/Class: 0.00    Years: 41.00    Types: Cigarettes    Quit date: 03/02/2015  . Smokeless tobacco: Never Used  . Alcohol use No     Allergies   Augmentin [amoxicillin-pot clavulanate]; Iohexol; Ivp dye [iodinated diagnostic agents]; and Lexapro [escitalopram oxalate]   Review of Systems Review of Systems  Constitutional: Negative for fever.  Respiratory: Positive for cough.   Gastrointestinal: Positive for nausea. Negative for vomiting.     Physical Exam Updated Vital Signs BP 160/68   Pulse 113   Temp 97.5 F (36.4 C) (Oral)   Resp 20   Ht 5\' 9"  (1.753 m)   Wt 79.4 kg   SpO2 96%   BMI 25.84 kg/m   Physical Exam  Constitutional: He appears well-developed and well-nourished. No distress.  HENT:  Head: Normocephalic and atraumatic.  Right Ear: External ear normal.  Left Ear: External ear normal.  Eyes: Conjunctivae are normal. Right eye exhibits no discharge. Left eye exhibits no discharge. No scleral icterus.  Neck: Neck supple. No tracheal deviation present.  Cardiovascular: Regular rhythm and  intact distal pulses.  Tachycardia present.   Pulmonary/Chest: Effort normal and breath sounds normal. No stridor. No respiratory distress. He has no wheezes. He has no rales.  Abdominal: Soft. Bowel sounds are normal. He exhibits no distension. There is no tenderness. There is no rebound and no guarding.  Musculoskeletal: He exhibits no edema or tenderness.  Neurological: He is alert. He has normal strength. No cranial nerve deficit (no facial droop, extraocular movements intact, no slurred speech) or sensory deficit. He exhibits normal muscle tone. He displays no seizure activity. Coordination normal.  Skin: Skin is warm and dry. No rash noted.  Psychiatric: He has a normal mood and affect.  Nursing note and vitals reviewed.    ED Treatments / Results  Labs (all labs ordered are listed, but only abnormal results are displayed) Labs Reviewed  COMPREHENSIVE METABOLIC PANEL - Abnormal; Notable for the following:       Result Value   Glucose, Bld 172 (*)    Calcium 8.2 (*)    Total Protein 6.0 (*)    All other components within normal limits  CBC - Abnormal; Notable for the following:    HCT 38.8 (*)    All other components within normal limits  URINALYSIS, ROUTINE W REFLEX MICROSCOPIC (NOT AT [facility]) - Abnormal; Notable for the following:    Glucose, UA 500 (*)    Ketones, ur 15 (*)    All other components within normal limits  CBG MONITORING, ED - Abnormal; Notable for the following:    Glucose-Capillary 215 (*)    All other components within normal limits  LIPASE, BLOOD  D-DIMER, QUANTITATIVE (NOT AT [facility])  I-STAT CG4 LACTIC ACID, ED  Randolm Idol, ED  Randolm Idol, ED    EKG  EKG Interpretation  Date/Time:  Thursday May 20 2016 07:56:53 EDT Ventricular Rate:  92 PR Interval:    QRS Duration: 89 QT Interval:  395 QTC Calculation: 489 R Axis:   93 Text Interpretation:  Sinus rhythm Right axis deviation Probable anteroseptal infarct, old Minimal ST elevation,  inferior leads No significant change since last tracing Confirmed by Jamilex Bohnsack  MD-J, Taniya Dasher [id number]) on 05/20/2016 9:46:16 AM       Radiology Dg Chest 2 View  Result Date: 05/20/2016 CLINICAL DATA:  Productive cough, wheezing, and shortness  of breath for the past 2 days. History of asthma/COPD, CAD. Current smoker. EXAM: CHEST  2 VIEW COMPARISON:  PA chest x-ray of January 29, 2016 FINDINGS: The lungs are well-expanded. The interstitial markings are slightly more conspicuous overall than on the previous study. There is no alveolar infiltrate. There is no pleural effusion. The heart and pulmonary vascularity are normal. The mediastinum is normal in width. There are old deformities of the posterior aspects of the left seventh and eighth ribs. The thoracic vertebral bodies are preserved in height. There is calcification in the wall of the aortic arch. IMPRESSION: Mild increased interstitial prominence likely reflects peribronchial cuffing and/or subsegmental atelectasis associated with acute bronchitis superimposed upon chronic lung disease. No overt evidence of pneumonia or CHF. Aortic atherosclerosis. Electronically Signed   By: David  Martinique M.D.   On: 05/20/2016 09:22    Procedures Procedures (including critical care time)  Medications Ordered in ED Medications  0.9 %  sodium chloride infusion (0 mLs Intravenous Stopped 05/20/16 1011)    Followed by  0.9 %  sodium chloride infusion (0 mLs Intravenous Stopped 05/20/16 1116)    Followed by  0.9 %  sodium chloride infusion (1,000 mLs Intravenous New Bag/Given 05/20/16 1030)    Followed by  0.9 %  sodium chloride infusion (1,000 mLs Intravenous New Bag/Given 05/20/16 1115)  ketorolac (TORADOL) 30 MG/ML injection 15 mg (not administered)     Initial Impression / Assessment and Plan / ED Course  I have reviewed the triage vital signs and the nursing notes.  Pertinent labs & imaging results that were available during my care of the patient were reviewed by  me and considered in my medical decision making (see chart for details).  Clinical Course  Comment By Time  Pt requests something for his cervical disc impingement pain.  Will give toradol Dorie Rank, MD 11/02 1251   Pt presented to the ED with a syncopal episode.  Appears to have been associated with his low blood sugar however he was found to be hypotensive in the field.  Pt has improved with IV fluids but remains tachycardic on EKG.  No signs of pna.  Doubt sepsis.  2 sets of cardiac enzymes negative.  No chest pain. Will add on d dimer to screen for PE. Consult with medical service for observation considering comorbidities and the persistent tachcyardia  Final Clinical Impressions(s) / ED Diagnoses   Final diagnoses:  Syncope, unspecified syncope type  Hypoglycemia      Dorie Rank, MD 05/20/16 1255

## 2016-05-20 NOTE — H&P (Addendum)
History and Physical  James [name] [id number] DOB: [birth year] DOA: 05/20/2016  Referring physician: Marye Round, ER physician  PCP: Sheela Stack, MD  Outpatient Specialists: Dr. Sherwood Gambler, neurosurgery Patient coming from: Home & is able to ambulate without assistance  Chief Complaint:  Passed out  HPI: James [name] is a 60 y.o. male with medical history significant of diabetes mellitus1 on an insulin pump who was brought to the emergency room after having an unresponsive episode. Per patient and his wife, his baseline is that he has about 1 hypoglycemic event per Vent. It usually described as lightheadedness and his insulin pump was off telling him he has a low blood sugar so he'll drink Coke. Patient's wife states that he is not very good about his diet oftentimes may go all [place] without eating and really the only time that he eats is when she prepares food for him.  Patient has never had a syncopal event or seizure due to hypoglycemia. He has been on insulin pump now for about a year and a half and prior to this, his sugars would get very high and he had 2 hospitalizations for DKA. None since insulin pump.  He also suffers from herniated disc leading to left arm pain in his cervical spine that he is planning to have surgery for next month.  The last few days, patient has been having a cold with congestion, some cough with occasional production of clear sputum and some wheezing. No shortness of breath.  This morning, patient woke up with complaints of left arm pain caused by his cervical disc-not new, but then he started having lightheadedness and felt like his sugar was low. His pump meter went off and he drank a Coke. However the symptoms did not resolve. He called for his wife who attended to him and she tried to give him more soda. at this point, patient's wife stated that he passed out. She could not wake him so she called paramedics. Patient had no seizure activity no tremors no loss of bowel  or bladder. When paramedics arrived, patient's blood sugar was in the 150s, however his blood pressure was very low in the 80s. He was able to be awoken and then brought over to the ambulance. Patient started on IV fluids and reportedly pressures got as low as 70s en route to the emergency room.  ED Course: In emergency, patient's blood pressures improved into the 100s. Sugar stayed stable as well. He was awake and alert and interactive. He still felt very weak. Labwork was unremarkable. Chest x-ray showed some signs of early bronchitis. Hospitalists were called for further evaluation and admission  Review of Systems: Patient seen after arrival to floor . Pt complains of generalized weakness as well as some left arm pain starting after his elbow and into his right hand.  Patient also complains of cough with occasional whitish sputum as well as some wheezing. Complains of a running nose and congestion  Pt denies any headache, vision changes, dysphagia, chest pain, palpitations, shortness of breath, abdominal pain, hematuria, dysuria, constipation, diarrhea, focal extremity numbness weakness or pain .  Review of systems are otherwise negative   Past Medical History:  Diagnosis Date  . ANXIETY 04/03/2007  . Anxiety   . ASTHMA 09/06/2008  . ASTHMATIC BRONCHITIS, ACUTE 10/25/2008  . Bladder neck obstruction   . CARPAL TUNNEL SYNDROME, BILATERAL 07/31/2007   issues resolved, no surgery  . Cervical disc disease   . Chronic bronchitis ([place])    "  get it about q yr" (02/12/2014)  . COPD (chronic obstructive pulmonary disease) ([place])   . CORONARY ARTERY DISEASE 04/03/2007  . DEPRESSION 04/03/2007  . Depression   . DIABETES MELLITUS, TYPE I 04/03/2007  . Diabetic retinopathy associated with diabetes mellitus due to underlying condition ([place]) 04/03/2007  . DM W/EYE MANIFESTATIONS, TYPE I, UNCONTROLLED 04/04/2007  . 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 ED (erectile dysfunction)   . HYPERLIPIDEMIA  04/04/2007  . Pneumonia    "several times and again today" (02/13/2104)  . Renal insufficiency   . Seizures ([place])    "insulin seizure from time to time; none in the last couple years" (02/12/2014)  . Spinal stenosis    Past Surgical History:  Procedure Laterality Date  . ANTERIOR CERVICAL DECOMP/DISCECTOMY FUSION  2000   "couple screws and a plate"  . APPENDECTOMY    . BACK SURGERY    . CARDIAC CATHETERIZATION  1990's  . CATARACT EXTRACTION W/ INTRAOCULAR LENS  IMPLANT, BILATERAL Bilateral   . CYSTOSCOPY WITH RETROGRADE PYELOGRAM, URETEROSCOPY AND STENT PLACEMENT Bilateral 04/06/2013   Procedure: BILATERAL CYSTOSCOPY WITH RETROGRADE PYELOGRAMS, STENT PLACEMENTS AND LEFT URETEROSCOPY AND STONE REMOVAL;  Surgeon: Alexis Frock, MD;  Location: [facility];  Service: Urology;  Laterality: Bilateral;  . CYSTOSCOPY WITH STENT PLACEMENT Right 04/12/2013   Procedure: CYSTOSCOPY WITH STENT PLACEMENT;  Surgeon: Alexis Frock, MD;  Location: [facility];  Service: Urology;  Laterality: Right;  . CYSTOSCOPY/RETROGRADE/URETEROSCOPY Bilateral 04/12/2013   Procedure: CYSTOSCOPY/RETROGRADE/URETEROSCOPY;  Surgeon: Alexis Frock, MD;  Location: [facility];  Service: Urology;  Laterality: Bilateral;  RIGHT RETROGRADE   . HOLMIUM LASER APPLICATION Left [id number]   Procedure: HOLMIUM LASER APPLICATION;  Surgeon: Alexis Frock, MD;  Location: [facility];  Service: Urology;  Laterality: Left;  . LYMPH NODE DISSECTION  ~ 1960   groin  . stress cardiolite  09/06/2002  . TONSILLECTOMY    . VITRECTOMY Bilateral     Social History:  reports that he quit smoking about 14 months ago, But recently restarted in the last few months after getting a new job. His smoking use included Cigarettes. He smoked 0.00 packs per [place] for 41.00 years. He has never used smokeless tobacco. He reports that he does not drink alcohol or use drugs.  Patient lives at home with his wife   Allergies  Allergen Reactions  . Augmentin [Amoxicillin-Pot  Clavulanate] Nausea And Vomiting and Other (See Comments)    Has patient had a PCN reaction causing immediate rash, facial/tongue/throat swelling, SOB or lightheadedness with hypotension:  No  Has patient had a PCN reaction causing severe rash involving mucus membranes or skin necrosis:  No Has patient had a PCN reaction that required hospitalization No Has patient had a PCN reaction occurring within the last 10 years: No If all of the above answers are "NO", then may proceed with Cephalosporin use.  . Iohexol Hives and Other (See Comments)    Reaction:  Dizziness/sweating    . Ivp Dye [Iodinated Diagnostic Agents] Hives and Other (See Comments)    Reaction:  Dizziness/sweating   . Lexapro [Escitalopram Oxalate] Itching    Family History  Problem Relation Age of Onset  . Stroke Father     strong FH cerebrovascular disease  . Diabetes Brother   . Heart disease Brother     CHF  . Cancer Mother   . Colon cancer Neg Hx       Prior to Admission medications   Medication Sig Start Date End Date  Taking? Authorizing Provider  acetaminophen (TYLENOL) 500 MG tablet Take 1,000 mg by mouth every 6 (six) hours as needed for mild pain or headache.    Yes Historical Provider, MD  albuterol (VENTOLIN HFA) 108 (90 BASE) MCG/ACT inhaler Inhale 2 puffs into the lungs every 6 (six) hours as needed for wheezing or shortness of breath. 04/11/15  Yes Tammy S Parrett, NP  cetirizine (ZYRTEC) 10 MG tablet Take 10 mg by mouth daily as needed for allergies.   Yes Historical Provider, MD  ibuprofen (ADVIL,MOTRIN) 200 MG tablet Take 800 mg by mouth every 8 (eight) hours as needed for headache, mild pain or moderate pain.    Yes Historical Provider, MD  Insulin Human (INSULIN PUMP) SOLN Pt uses Humalog 100 units/mL.   Yes Historical Provider, MD  pseudoephedrine (SUDAFED) 30 MG tablet Take 30 mg by mouth every 4 (four) hours as needed for congestion.   Yes Historical Provider, MD  simvastatin (ZOCOR) 40 MG tablet  Take 40 mg by mouth at bedtime.    Yes Historical Provider, MD  traZODone (DESYREL) 150 MG tablet Take 150 mg by mouth at bedtime.   Yes Historical Provider, MD  budesonide-formoterol (SYMBICORT) 160-4.5 MCG/ACT inhaler Inhale 2 puffs into the lungs 2 (two) times daily.    Historical Provider, MD    Physical Exam: BP 135/67   Pulse (!) 103   Temp 99.2 F (37.3 C) (Oral)   Resp (!) 22   Ht 5\' 9"  (1.753 m)   Wt 79.4 kg (175 lb)   SpO2 97%   BMI 25.84 kg/m   General: Alert and oriented 3, no acute distress, fatigued    Eyes: Sclera nonicteric, asked ocular movements are intact  ENT: Normocephalic major medical mucous members are slightly dry  Neck: Supple, no JVD  Cardiovascular: Regular rate and rhythm, borderline tachycardia  Respiratory: Bilateral wheezing, some rhonchi  Abdomen: Soft, nontender, nondistended, positive bowel sounds  Skin: No skin breaks, tears or lesions  Musculoskeletal: No clubbing or cyanosis or edema, no foot ulcerations mild callus formation bilaterally decreased grip strength of left upper extremity secondary to pain from cervical radiculopathy.  Psychiatric: Patient is appropriate, no evidence of psychoses  Neurologic: No focal deficits. Sensation intact in lower extremities          Labs on Admission:  Basic Metabolic Panel:  Recent Labs Lab 05/20/16 0808  NA 137  K 3.9  CL 108  CO2 23  GLUCOSE 172*  BUN 14  CREATININE 0.92  CALCIUM 8.2*   Liver Function Tests:  Recent Labs Lab 05/20/16 0808  AST 21  ALT 25  ALKPHOS 92  BILITOT 0.8  PROT 6.0*  ALBUMIN 3.8    Recent Labs Lab 05/20/16 0808  LIPASE 24   No results for input(s): AMMONIA in the last 168 hours. CBC:  Recent Labs Lab 05/20/16 0808  WBC 6.6  HGB 13.9  HCT 38.8*  MCV 90.9  PLT 200   Cardiac Enzymes: No results for input(s): CKTOTAL, CKMB, CKMBINDEX, TROPONINI in the last 168 hours.  BNP (last 3 results) No results for input(s): BNP in the last 8760  hours.  ProBNP (last 3 results) No results for input(s): PROBNP in the last 8760 hours.  CBG:  Recent Labs Lab 05/20/16 0943 05/20/16 1636  GLUCAP 215* 86    Radiological Exams on Admission: Dg Chest 2 View  Result Date: 05/20/2016 CLINICAL DATA:  Productive cough, wheezing, and shortness of breath for the past 2 days. History of asthma/COPD,  CAD. Current smoker. EXAM: CHEST  2 VIEW COMPARISON:  PA chest x-ray of January 29, 2016 FINDINGS: The lungs are well-expanded. The interstitial markings are slightly more conspicuous overall than on the previous study. There is no alveolar infiltrate. There is no pleural effusion. The heart and pulmonary vascularity are normal. The mediastinum is normal in width. There are old deformities of the posterior aspects of the left seventh and eighth ribs. The thoracic vertebral bodies are preserved in height. There is calcification in the wall of the aortic arch. IMPRESSION: Mild increased interstitial prominence likely reflects peribronchial cuffing and/or subsegmental atelectasis associated with acute bronchitis superimposed upon chronic lung disease. No overt evidence of pneumonia or CHF. Aortic atherosclerosis. Electronically Signed   By: David  Martinique M.D.   On: 05/20/2016 09:22    EKG: Independently reviewed. Normal sinus rhythm, unchanged from previous with minimal ST elevation in inferior leads   Assessment/Plan Present on Admission: . Syncope: Likely caused by hypoglycemia. See below . 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 See below.  . Tobacco abuse: Patient recently restarted smoking. Urged him to quit. He declined nicotine patch.  . Cervical disc disorder with radiculopathy of cervical region: Plan is for neurosurgery in the next month.  In the meantime, patient requested some kind of medicine for pain. Have started some Neurontin checks plan to  patient would not help right away, but with slow titration up to give him some benefit. He is going to try. Has started Neurontin 100 3 times a H[place] and if he tolerates this, we'll discharge with a prescription  . Hypoglycemia due to type 1 diabetes mellitus ([place]): Suspect issue is that patient has frequent hypoglycemic episodes and with acute bronchitis likely pushed him over the edge. We had a long discussion about the importance of not going long period without eating as he was lucky that his wife was present today otherwise patient risk death or coma. Monitor sugars overnight. Encourage by mouth on meals.  Bronchitis: Given symptomatic, will treat with Xopenex and Zithromax plus nasal spray  DVT prophylaxis: Lovenox   Code Status: Full code as discussed with patient and wife   Family Communication: Wife at the bedside   Disposition Plan: Home tomorrow as long as CBG stable and patient feeling better   Consults called: None   Admission status: Patient will likely be able to go home tomorrow so we will place him under observation  Annita Brod MD [facility] Pager [phone]  If 7PM-7AM, please contact night-coverage [email or web address] Password [email or web address]  05/20/2016, 7:24 PM

## 2016-05-20 NOTE — ED Notes (Signed)
Pt informed of need for urine sample.

## 2016-05-21 DIAGNOSIS — Z9641 Presence of insulin pump (external) (internal): Secondary | ICD-10-CM | POA: Diagnosis not present

## 2016-05-21 DIAGNOSIS — J449 Chronic obstructive pulmonary disease, unspecified: Secondary | ICD-10-CM | POA: Diagnosis not present

## 2016-05-21 DIAGNOSIS — E162 Hypoglycemia, unspecified: Secondary | ICD-10-CM

## 2016-05-21 DIAGNOSIS — E10649 Type 1 diabetes mellitus with hypoglycemia without coma: Secondary | ICD-10-CM | POA: Diagnosis not present

## 2016-05-21 DIAGNOSIS — F17210 Nicotine dependence, cigarettes, uncomplicated: Secondary | ICD-10-CM | POA: Diagnosis not present

## 2016-05-21 DIAGNOSIS — R55 Syncope and collapse: Secondary | ICD-10-CM | POA: Diagnosis not present

## 2016-05-21 DIAGNOSIS — J40 Bronchitis, not specified as acute or chronic: Secondary | ICD-10-CM | POA: Diagnosis not present

## 2016-05-21 DIAGNOSIS — M5010 Cervical disc disorder with radiculopathy, unspecified cervical region: Secondary | ICD-10-CM

## 2016-05-21 DIAGNOSIS — Z720 Tobacco use: Secondary | ICD-10-CM

## 2016-05-21 DIAGNOSIS — Z794 Long term (current) use of insulin: Secondary | ICD-10-CM | POA: Diagnosis not present

## 2016-05-21 DIAGNOSIS — I2510 Atherosclerotic heart disease of native coronary artery without angina pectoris: Secondary | ICD-10-CM | POA: Diagnosis not present

## 2016-05-21 LAB — BASIC METABOLIC PANEL
ANION GAP: 6 (ref 5–15)
BUN: 12 mg/dL (ref 6–20)
CALCIUM: 8.2 mg/dL — AB (ref 8.9–10.3)
CO2: 24 mmol/L (ref 22–32)
Chloride: 112 mmol/L — ABNORMAL HIGH (ref 101–111)
Creatinine, Ser: 0.74 mg/dL (ref 0.61–1.24)
Glucose, Bld: 62 mg/dL — ABNORMAL LOW (ref 65–99)
Potassium: 3.8 mmol/L (ref 3.5–5.1)
SODIUM: 142 mmol/L (ref 135–145)

## 2016-05-21 LAB — CBC
HCT: 37.9 % — ABNORMAL LOW (ref 39.0–52.0)
Hemoglobin: 13.1 g/dL (ref 13.0–17.0)
MCH: 31.8 pg (ref 26.0–34.0)
MCHC: 34.6 g/dL (ref 30.0–36.0)
MCV: 92 fL (ref 78.0–100.0)
PLATELETS: 211 10*3/uL (ref 150–400)
RBC: 4.12 MIL/uL — ABNORMAL LOW (ref 4.22–5.81)
RDW: 12.7 % (ref 11.5–15.5)
WBC: 5.3 10*3/uL (ref 4.0–10.5)

## 2016-05-21 LAB — GLUCOSE, CAPILLARY
GLUCOSE-CAPILLARY: 106 mg/dL — AB (ref 65–99)
GLUCOSE-CAPILLARY: 68 mg/dL (ref 65–99)
Glucose-Capillary: 53 mg/dL — ABNORMAL LOW (ref 65–99)
Glucose-Capillary: 140 mg/dL — ABNORMAL HIGH (ref 65–99)

## 2016-05-21 MED ORDER — AZITHROMYCIN 250 MG PO TABS: 250.0000 mg | ORAL_TABLET | Freq: Every day | ORAL | 0 refills | Status: AC

## 2016-05-21 MED ORDER — SALINE SPRAY 0.65 % NA SOLN: 1.0000 | NASAL | 0 refills | Status: DC | PRN

## 2016-05-21 MED ORDER — LEVALBUTEROL HCL 0.63 MG/3ML IN NEBU: 0.6300 mg | INHALATION_SOLUTION | Freq: Three times a day (TID) | RESPIRATORY_TRACT | Status: DC

## 2016-05-21 MED ORDER — GUAIFENESIN ER 600 MG PO TB12: 600.0000 mg | ORAL_TABLET | Freq: Two times a day (BID) | ORAL | 0 refills | Status: DC

## 2016-05-21 MED ORDER — DEXAMETHASONE SODIUM PHOSPHATE 4 MG/ML IJ SOLN
2.0000 mg | Freq: Once | INTRAMUSCULAR | Status: AC
  Administered 2016-05-21: 2 mg via INTRAVENOUS
  Filled 2016-05-21: qty 1

## 2016-05-21 MED ORDER — INSULIN PUMP
Freq: Three times a day (TID) | SUBCUTANEOUS | Status: DC
  Administered 2016-05-21: 4.1 via SUBCUTANEOUS
  Administered 2016-05-21: 4.3 via SUBCUTANEOUS
  Filled 2016-05-21: qty 1

## 2016-05-21 MED ORDER — GABAPENTIN 100 MG PO CAPS: 100.0000 mg | ORAL_CAPSULE | Freq: Three times a day (TID) | ORAL | 0 refills | Status: DC

## 2016-05-21 MED FILL — GABAPENTIN 100 MG CAPSULE: 100 | 30 days supply | Qty: 90 | Fill #0

## 2016-05-21 MED FILL — AZITHROMYCIN 250 MG TABLET: 250 | 3 days supply | Qty: 3 | Fill #0

## 2016-05-21 NOTE — Progress Notes (Signed)
Hypoglycemic Event  CBG: 53  Treatment: 15 GM carbohydrate snack  Symptoms: None  Follow-up CBG: Time:0521 CBG Result:106  Possible Reasons for Event: Unknown  Comments/MD notified:     Grafton Folk

## 2016-05-21 NOTE — Care Management Note (Signed)
Case Management Note  Patient Details  Name: Theordore Volbrecht Antos MRN: [id number] Date of Birth: [birth year]  Subjective/Objective:  60 y/o m admitted w/Syncope.Hx: DM, has insulin pump, has glucometer Patient has concerns whether insulin pump is working properly-DM Ed has seen patient-see note-advised patient to f/u w/CDE-regard to insulin pump.No further CM needs.                  Action/Plan:d/c plan home.   Expected Discharge Date:   (unknown)               Expected Discharge Plan:  Home/Self Care  In-House Referral:     Discharge planning Services  CM Consult  Post Acute Care Choice:    Choice offered to:     DME Arranged:    DME Agency:     HH Arranged:    HH Agency:     Status of Service:  In process, will continue to follow  If discussed at Long Length of Stay Meetings, dates discussed:    Additional Comments:  Dessa Phi, RN 05/21/2016, 1:28 PM

## 2016-05-21 NOTE — Progress Notes (Signed)
Inpatient Diabetes Program Recommendations  AACE/ADA: New Consensus Statement on Inpatient Glycemic Control (2015)  Target Ranges:  Prepandial:   less than 140 mg/dL      Peak postprandial:   less than 180 mg/dL (1-2 hours)      Critically ill patients:  140 - 180 mg/dL   Lab Results  Component Value Date   GLUCAP 68 05/21/2016   HGBA1C 6.4 (H) 12/22/2015    Review of Glycemic Control  Diabetes history: DM1 Outpatient Diabetes medications: Insulin Pump Current orders for Inpatient glycemic control: Insulin Pump per home use  Hypoglycemia on admission. Pt states he has had low blood sugars quite frequently within the past month or so. Had insulin pump settings changed recently and did not f/u with PharmD, CDE at endo's office. Advised pt to f/u on [place] of discharge with CDE to decrease his pump settings to prevent hypoglycemia.   Discussed above with MD and RN.   Continue to follow.  Thank you. Lorenda Peck, RD, LDN, CDE Inpatient Diabetes Coordinator [phone]

## 2016-05-21 NOTE — Discharge Summary (Addendum)
Physician Discharge Summary  James [name]  [id number]  DOB: July 03, 1956  DOA: 05/20/2016 PCP: Sheela Stack, MD  Admit date: 05/20/2016 Discharge date: 05/21/2016  Admitted From: Home  Disposition: Home  Recommendations for Outpatient Follow-up:  1. Follow up with PCP in 1-2 weeks 2. Please obtain BMP/CBC in one week 3. Please follow up with Pharm D for insulin pump setting adjustment 4. Follow up with Endocrinologist  Home Health: None Equipment/Devices: Insulin pump   Discharge Condition: Stable  CODE STATUS: Full  Diet recommendation: Heart Healthy / Carb Modified   Brief/Interim Summary: James [name] is a 60 y.o. male with medical history significant of diabetes mellitus1 on an insulin pump who was brought to the emergency room after having a syncopal. Pt reports he has had low blood sugars quite frequently within the past month or so. Had insulin pump settings changed recently and did not f/u.   Patient reports that he start feeling dizzy and lightheaded felt like sugar was low, he drank some coke, but symptoms persist and subsequently passed out. When paramedics arrived patient blood sugar was 150 however because pressure was low patient was awake, but during transportation blood pressure continued to be low. Patient was given IV fluid via EMS. Upon arrival to the ED blood pressure was improved sugar stayed stable and he was awake, alert and interactive.   Patient has some symptoms of upper respiratory infection, complaining of runny nose congestion with wheezing. Chest x-ray was done showed signs of early bronchitis. No increasing white count or fever denies shortness of breath. Patient was started on azithromycin, nebulizer treatments at home medications. Sugars have been stable, breathing has improved one dose of steroid was given. Patient will be discharged to follow-up with his primary care physician and with pharm D to adjust settings of insulin  pump.  Subjective: Patient seen and examined this am, patient report feeling good, want to go home. Denies chest pain, SOB, dizziness and palpitation.   Discharge Diagnoses:  Syncope - secondary to hypoglycemia - resolved Patient report having long periods without eating. -Advice on frequent small meals - given insulin pump -Need to follow-up with practitioner to adjust setting of insulin -Call [employer] to troubleshoot insulin pump -patient reports some discrepancy on fingersticks and insulin pump readings  Tobacco abuse  -Tobacco cessation discussed  Bronchitis - setting of COPD - one dose of steroid given, O2 sats normal, no white count or fever -We will treat with azithromycin for 5 days -Resume home inhalers -Follow-up with PMD in 1 week  Cervical disc disorder with radiculopathy of cervical region: Plan is for neurosurgery in the next month.  In the meantime, patient requested some kind of medicine for pain. Have started on Neurontin, with slow titration up to give him some benefit. Neurontin 100 3 times a [place]. - Follow-up with neurosurgery  Diabetes type 1 on insulin pump at home - initially hypoglycemic causing syncopal episode -See above  Discharge Instructions  Discharge Instructions    Call MD for:  difficulty breathing, headache or visual disturbances    Complete by:  As directed    Call MD for:  extreme fatigue    Complete by:  As directed    Call MD for:  hives    Complete by:  As directed    Call MD for:  persistant dizziness or light-headedness    Complete by:  As directed    Call MD for:  persistant nausea and vomiting    Complete by:  As directed    Call MD for:  redness, tenderness, or signs of infection (pain, swelling, redness, odor or green/yellow discharge around incision site)    Complete by:  As directed    Call MD for:  severe uncontrolled pain    Complete by:  As directed    Call MD for:  temperature >100.4    Complete by:  As directed    Diet -  low sodium heart healthy    Complete by:  As directed    Discharge instructions    Complete by:  As directed    Discharge instructions    Complete by:  As directed    Urgent follow up with practitioner that manage insulin pump Call [employer] for Insulin pump troubleshouting   Check finger stick every 6 hrs  DO NOT SKIP MEALS   Increase activity slowly    Complete by:  As directed        Medication List    STOP taking these medications   pseudoephedrine 30 MG tablet Commonly known as:  SUDAFED     TAKE these medications   acetaminophen 500 MG tablet Commonly known as:  TYLENOL Take 1,000 mg by mouth every 6 (six) hours as needed for mild pain or headache.   albuterol 108 (90 Base) MCG/ACT inhaler Commonly known as:  VENTOLIN HFA Inhale 2 puffs into the lungs every 6 (six) hours as needed for wheezing or shortness of breath.   azithromycin 250 MG tablet Commonly known as:  ZITHROMAX Take 1 tablet (250 mg total) by mouth daily.   budesonide-formoterol 160-4.5 MCG/ACT inhaler Commonly known as:  SYMBICORT Inhale 2 puffs into the lungs 2 (two) times daily.   cetirizine 10 MG tablet Commonly known as:  ZYRTEC Take 10 mg by mouth daily as needed for allergies.   gabapentin 100 MG capsule Commonly known as:  NEURONTIN Take 1 capsule (100 mg total) by mouth 3 (three) times daily.   guaiFENesin 600 MG 12 hr tablet Commonly known as:  MUCINEX Take 1 tablet (600 mg total) by mouth 2 (two) times daily.   ibuprofen 200 MG tablet Commonly known as:  ADVIL,MOTRIN Take 800 mg by mouth every 8 (eight) hours as needed for headache, mild pain or moderate pain.   insulin pump Soln Pt uses Humalog 100 units/mL.   simvastatin 40 MG tablet Commonly known as:  ZOCOR Take 40 mg by mouth at bedtime.   sodium chloride 0.65 % Soln nasal spray Commonly known as:  OCEAN Place 1 spray into both nostrils as needed for congestion.   traZODone 150 MG tablet Commonly known as:   DESYREL Take 150 mg by mouth at bedtime.       Allergies  Allergen Reactions  . Augmentin [Amoxicillin-Pot Clavulanate] Nausea And Vomiting and Other (See Comments)    Has patient had a PCN reaction causing immediate rash, facial/tongue/throat swelling, SOB or lightheadedness with hypotension:  No  Has patient had a PCN reaction causing severe rash involving mucus membranes or skin necrosis:  No Has patient had a PCN reaction that required hospitalization No Has patient had a PCN reaction occurring within the last 10 years: No If all of the above answers are "NO", then may proceed with Cephalosporin use.  . Iohexol Hives and Other (See Comments)    Reaction:  Dizziness/sweating    . Ivp Dye [Iodinated Diagnostic Agents] Hives and Other (See Comments)    Reaction:  Dizziness/sweating   . Lexapro [Escitalopram Oxalate] Itching  Consultations:  None   Procedures/Studies: Dg Chest 2 View  Result Date: 05/20/2016 CLINICAL DATA:  Productive cough, wheezing, and shortness of breath for the past 2 days. History of asthma/COPD, CAD. Current smoker. EXAM: CHEST  2 VIEW COMPARISON:  PA chest x-ray of January 29, 2016 FINDINGS: The lungs are well-expanded. The interstitial markings are slightly more conspicuous overall than on the previous study. There is no alveolar infiltrate. There is no pleural effusion. The heart and pulmonary vascularity are normal. The mediastinum is normal in width. There are old deformities of the posterior aspects of the left seventh and eighth ribs. The thoracic vertebral bodies are preserved in height. There is calcification in the wall of the aortic arch. IMPRESSION: Mild increased interstitial prominence likely reflects peribronchial cuffing and/or subsegmental atelectasis associated with acute bronchitis superimposed upon chronic lung disease. No overt evidence of pneumonia or CHF. Aortic atherosclerosis. Electronically Signed   By: David  Martinique M.D.   On: 05/20/2016  09:22   Mr Cervical Spine W Wo Contrast  Result Date: 04/27/2016 CLINICAL DATA:  Paresthesias.  Prior surgery in 2000. EXAM: MRI CERVICAL SPINE WITHOUT AND WITH CONTRAST TECHNIQUE: Multiplanar and multiecho pulse sequences of the cervical spine, to include the craniocervical junction and cervicothoracic junction, were obtained without and with intravenous contrast. CONTRAST:  32mL MULTIHANCE GADOBENATE DIMEGLUMINE 529 MG/ML IV SOLN COMPARISON:  CT C-spine 08/19/2013 FINDINGS: Alignment: Physiologic. Vertebrae: No fracture, evidence of discitis, or bone lesion. Cord: Normal signal and morphology. Posterior Fossa, vertebral arteries, paraspinal tissues: Negative. Disc levels: Discs: Anterior cervical fusion with solid osseous bridging across the C6-7 disc space. No hardware failure or complication. Remainder the disc heights are maintained. C2-3: No significant disc bulge. No neural foraminal stenosis. No central canal stenosis. C3-4: No significant disc bulge. No neural foraminal stenosis. No central canal stenosis. C4-5: Left paracentral disc protrusion contacting the ventral left paracentral cervical spinal cord. No neural foraminal stenosis. No central canal stenosis. C5-6: Large central disc protrusion mildly impressing upon the ventral central cervical spinal cord. Mild central canal stenosis. No neural foraminal stenosis. C6-7: Interbody fusion. No neural foraminal stenosis. No central canal stenosis. C7-T1: No significant disc bulge. No neural foraminal stenosis. No central canal stenosis. IMPRESSION: 1. At C5-6 there is a large central disc protrusion mildly impressing upon the ventral central cervical spinal cord. Mild central canal stenosis. 2. At C4-5 there is a left paracentral disc protrusion contacting the ventral left paracentral cervical spinal cord. 3. Anterior cervical fusion at C6-7 with solid osseous bridging. No foraminal or central canal stenosis. Electronically Signed   By: Kathreen Devoid    On: 04/27/2016 12:23      Discharge Exam: Vitals:   05/21/16 0517 05/21/16 0749  BP: (!) 140/57   Pulse: 89 89  Resp: 20 17  Temp: 98.4 F (36.9 C)    Vitals:   05/20/16 2111 05/20/16 2117 05/21/16 0517 05/21/16 0749  BP: (!) 135/53  (!) 140/57   Pulse: 88  89 89  Resp: 20  20 17   Temp: 98.6 F (37 C)  98.4 F (36.9 C)   TempSrc: Oral  Oral   SpO2: 94% 94% 95% 96%  Weight:      Height:        General: Pt is alert, awake, not in acute distress Cardiovascular: RRR, S1/S2 +, no rubs, no gallops Respiratory: Mild Wheezing some rhonchi Abdominal: Soft, NT, ND, bowel sounds + Extremities: no edema, no cyanosis   The results of significant diagnostics from  this hospitalization (including imaging, microbiology, ancillary and laboratory) are listed below for reference.     Microbiology: No results found for this or any previous visit (from the past 240 hour(s)).   Labs: BNP (last 3 results) No results for input(s): BNP in the last 8760 hours. Basic Metabolic Panel:  Recent Labs Lab 05/20/16 0808 05/21/16 0447  NA 137 142  K 3.9 3.8  CL 108 112*  CO2 23 24  GLUCOSE 172* 62*  BUN 14 12  CREATININE 0.92 0.74  CALCIUM 8.2* 8.2*   Liver Function Tests:  Recent Labs Lab 05/20/16 0808  AST 21  ALT 25  ALKPHOS 92  BILITOT 0.8  PROT 6.0*  ALBUMIN 3.8    Recent Labs Lab 05/20/16 0808  LIPASE 24   No results for input(s): AMMONIA in the last 168 hours. CBC:  Recent Labs Lab 05/20/16 0808 05/21/16 0447  WBC 6.6 5.3  HGB 13.9 13.1  HCT 38.8* 37.9*  MCV 90.9 92.0  PLT 200 211   Cardiac Enzymes: No results for input(s): CKTOTAL, CKMB, CKMBINDEX, TROPONINI in the last 168 hours. BNP: Invalid input(s): POCBNP CBG:  Recent Labs Lab 05/20/16 2116 05/21/16 0447 05/21/16 0521 05/21/16 0800 05/21/16 1150  GLUCAP 181* 53* 106* 68 140*   D-Dimer  Recent Labs  05/20/16 1250  DDIMER 0.38   Hgb A1c No results for input(s): HGBA1C in the last  72 hours. Lipid Profile No results for input(s): CHOL, HDL, LDLCALC, TRIG, CHOLHDL, LDLDIRECT in the last 72 hours. Thyroid function studies No results for input(s): TSH, T4TOTAL, T3FREE, THYROIDAB in the last 72 hours.  Invalid input(s): FREET3 Anemia work up No results for input(s): VITAMINB12, FOLATE, FERRITIN, TIBC, IRON, RETICCTPCT in the last 72 hours. Urinalysis    Component Value Date/Time   COLORURINE YELLOW 05/20/2016 [place] 05/20/2016 1134   LABSPEC 1.009 05/20/2016 1134   PHURINE 6.0 05/20/2016 1134   GLUCOSEU 500 (A) 05/20/2016 1134   GLUCOSEU 500 (?) 05/08/2010 1434   HGBUR NEGATIVE 05/20/2016 1134   BILIRUBINUR NEGATIVE 05/20/2016 1134   KETONESUR 15 (A) 05/20/2016 1134   PROTEINUR NEGATIVE 05/20/2016 1134   UROBILINOGEN 0.2 04/11/2013 0008   NITRITE NEGATIVE 05/20/2016 1134   LEUKOCYTESUR NEGATIVE 05/20/2016 1134   Sepsis Labs Invalid input(s): PROCALCITONIN,  WBC,  LACTICIDVEN Microbiology No results found for this or any previous visit (from the past 240 hour(s)).   Time coordinating discharge: Over 30 minutes  SIGNED:  Chipper Oman, MD  [facility] 05/21/2016, 2:02 PM Pager   If 7PM-7AM, please contact night-coverage [email or web address] Password [email or web address]

## 2016-06-04 MED FILL — HumaLOG 100 UNIT/ML SOLN: 100 | 90 days supply | Qty: 90 | Fill #1

## 2016-06-09 ENCOUNTER — Other Ambulatory Visit: Payer: Self-pay | Admitting: *Deleted

## 2016-06-14 ENCOUNTER — Encounter: Payer: Self-pay | Admitting: *Deleted

## 2016-06-14 NOTE — Patient Outreach (Signed)
[facility]) Care Management   06/09/16  James [name] Apr 02, 1956 [id number]  James [name] is an 60 y.o. male who presents to the [facility] office for routine Link To Wellness follow up for self management assistance with Type I DM and hyperlipidemia.   Subjective:  James [name] says he is doing OK, he says he is scheduled for cervical spine surgery on 12/7 as he is having numbness and tingling in both hands due to a herniated disk. He says he is considering canceling the surgery because he will lose his job as he has not accrued enough personal time. He also says he needs carpel tunnel surgery.  He says he was in the hospital overnight from 11/2-11/3 for syncope and low blood sugar. He was not able to identify what caused the low other than he usually eats no food until dinner and says he has done this most of his adult life. He says the incident happened in the early morning around 6 am and he reports he ate his usual dinner . He wonders if his insulin pump settings need to be adjusted.  He does not know the date of next appointment with his endocrinologist or the pharmacist at the endocrinologist's office; says he has everything written on a calendar at home.  He says he would be interested in trying the Medtronic MiniMed Hybrid Closed Loop 670G insulin pump system if he is a candidate.   Objective:   Review of Systems  Constitutional: Negative.     Physical Exam  Constitutional: He is oriented to person, place, and time. He appears well-developed and well-nourished.  Neurological: He is alert and oriented to person, place, and time.  Skin: Skin is warm and dry.  Psychiatric: He has a normal mood and affect. His behavior is normal. Judgment and thought content normal.   Filed Weights   06/09/16 0959  Weight: 177 lb 6.4 oz (80.5 kg)   Vitals:   06/09/16 0959  BP: 118/80  Fasting CBG= 128- checked by patient on his meter  Current  Medications:   Current Outpatient Prescriptions  Medication Sig Dispense Refill  . acetaminophen (TYLENOL) 500 MG tablet Take 1,000 mg by mouth every 6 (six) hours as needed for mild pain or headache.     . albuterol (PROVENTIL) (2.5 MG/3ML) 0.083% nebulizer solution Take 2.5 mg by nebulization every 6 (six) hours as needed for wheezing or shortness of breath.    [name] albuterol (VENTOLIN HFA) 108 (90 BASE) MCG/ACT inhaler Inhale 2 puffs into the lungs every 6 (six) hours as needed for wheezing or shortness of breath. 1 Inhaler 5  . cetirizine (ZYRTEC) 10 MG tablet Take 10 mg by mouth daily as needed for allergies.    [name] gabapentin (NEURONTIN) 100 MG capsule Take 1 capsule (100 mg total) by mouth 3 (three) times daily. 90 capsule 0  . ibuprofen (ADVIL,MOTRIN) 200 MG tablet Take 800 mg by mouth every 8 (eight) hours as needed for headache, mild pain or moderate pain.     . Insulin Human (INSULIN PUMP) SOLN Pt uses Humalog 100 units/mL.    [name] simvastatin (ZOCOR) 40 MG tablet Take 40 mg by mouth at bedtime.   5  . sodium chloride (OCEAN) 0.65 % SOLN nasal spray Place 1 spray into both nostrils as needed for congestion. 15 mL 0  . traZODone (DESYREL) 150 MG tablet Take 150 mg by mouth at bedtime.  5  . budesonide-formoterol (SYMBICORT) 160-4.5 MCG/ACT  inhaler Inhale 2 puffs into the lungs 2 (two) times daily.    [name] guaiFENesin (MUCINEX) 600 MG 12 hr tablet Take 1 tablet (600 mg total) by mouth 2 (two) times daily. (Patient not taking: Reported on 06/09/2016) 30 tablet 0   No current facility-administered medications for this visit.     Functional Status:   In your present state of health, do you have any difficulty performing the following activities: 06/09/2016 05/20/2016  Hearing? N N  Vision? N N  Difficulty concentrating or making decisions? N N  Walking or climbing stairs? N N  Dressing or bathing? N N  Doing errands, shopping? N N  Preparing Food and eating ? N -  Using the Toilet? N -  In the  past six months, have you accidently leaked urine? 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 Most recent Hgb  A1C= 6.2% on 01/29/16 and lipid panel was normal on 02/12/15.  Plan:  . [facility] CM Care Plan Problem One        Most Recent Value   Care Plan Problem One  Type I DM with most recent A1C= 6.2% on 01/29/16 and normal lipid panel on 02/12/15   Role Documenting the Problem One  Care Management [place] for Problem One  Active   [facility] Long Term Goal (31-90 days)  Ongoing good glycemic control as evidenced by A1C< 7.0% at next check without increased incidents of hypoglycemia, ongoing good control of hyperlipidemia as evidenced by normal lipid panel at next check   [facility] Long Term Goal Start Date  06/09/16   [facility] Long Term Goal Met Date     Interventions for Problem One Long Term Goal  Reviewed cause for overnight hospital stay in early November and strategies to prevent future ED visits/hospital admissions including a trial of the Medtronic 670 G insulin system, watched video with Jaasiel on the system and stressed the potential advantages of reducing hypoglycemic events especially at night, discussed the benefit coverage of the 670 G system under the Link To Wellness program,  will arrange for Link To Wellness follow up in February 2018      RNCM to fax today's office visit note to [name] and Oren Section Pharm D at [name] office. RNCM will meet quarterly and as needed with patient per Link To Wellness program guidelines to assist with Type I DM and hyperlipidemia self-management and assess patient's progress toward mutually set goals.  [name] RN,CCM,CDE [place] [name] Management Coordinator Link To Wellness Office Phone [phone] Office Fax  [phone]

## 2016-06-17 ENCOUNTER — Inpatient Hospital Stay (HOSPITAL_COMMUNITY)
Admission: RE | Admit: 2016-06-17 | Discharge: 2016-06-17 | Disposition: A | Discharge status: Home / Self Care | Payer: 59 | Source: Ambulatory Visit

## 2016-06-21 MED FILL — traZODone HCL 150 MG TABS: 150 | 30 days supply | Qty: 30 | Fill #0

## 2016-06-21 MED FILL — SIMVASTATIN 40 MG TABLET: 40 | 90 days supply | Qty: 90 | Fill #0

## 2016-06-23 ENCOUNTER — Encounter (HOSPITAL_COMMUNITY): Payer: Self-pay | Admitting: *Deleted

## 2016-06-23 NOTE — Progress Notes (Signed)
Spoke with pt for pre-op call. Pt denies cardiac history or chest pain. Pt states he does have sob at times, is followed by a pulmonologist at [facility]. Pt is a type 1 diabetic. Last A1C was 6.4 in June. Pt states his fasting blood sugar is usually around 107. Pt is on an Insulin pump, but does not know his basal rate. I instructed pt to call his endocrinologist, [name] and ask them how they want him to adjust his insulin tonight. He states he will do that. I have spoken with Lelan Pons, RN, Diabetic Coordinator and she is aware that pt will be having surgery tomorrow AM. Instructed pt to check blood sugar in AM. If blood sugar is 70 or below, treat with 1/2 cup of clear juice (apple or cranberry) and recheck blood sugar 15 minutes after drinking juice. Pt voiced understanding.

## 2016-06-24 ENCOUNTER — Ambulatory Visit (HOSPITAL_COMMUNITY): Payer: 59 | Admitting: Anesthesiology

## 2016-06-24 ENCOUNTER — Observation Stay (HOSPITAL_COMMUNITY)
Admission: RE | Admit: 2016-06-24 | Discharge: 2016-06-24 | Disposition: A | Discharge status: Home / Self Care | Payer: 59 | Source: Ambulatory Visit | Attending: Neurosurgery | Admitting: Neurosurgery

## 2016-06-24 ENCOUNTER — Encounter (HOSPITAL_COMMUNITY)
Admission: RE | Disposition: A | Discharge status: Home / Self Care | Payer: Self-pay | Source: Ambulatory Visit | Attending: Neurosurgery

## 2016-06-24 ENCOUNTER — Encounter (HOSPITAL_COMMUNITY): Payer: Self-pay | Admitting: Anesthesiology

## 2016-06-24 ENCOUNTER — Ambulatory Visit (HOSPITAL_COMMUNITY): Payer: 59

## 2016-06-24 DIAGNOSIS — I2510 Atherosclerotic heart disease of native coronary artery without angina pectoris: Secondary | ICD-10-CM | POA: Insufficient documentation

## 2016-06-24 DIAGNOSIS — M5020 Other cervical disc displacement, unspecified cervical region: Secondary | ICD-10-CM | POA: Diagnosis present

## 2016-06-24 DIAGNOSIS — E785 Hyperlipidemia, unspecified: Secondary | ICD-10-CM | POA: Insufficient documentation

## 2016-06-24 DIAGNOSIS — F419 Anxiety disorder, unspecified: Secondary | ICD-10-CM | POA: Diagnosis not present

## 2016-06-24 DIAGNOSIS — K219 Gastro-esophageal reflux disease without esophagitis: Secondary | ICD-10-CM | POA: Diagnosis not present

## 2016-06-24 DIAGNOSIS — M50221 Other cervical disc displacement at C4-C5 level: Secondary | ICD-10-CM | POA: Diagnosis not present

## 2016-06-24 DIAGNOSIS — Z881 Allergy status to other antibiotic agents status: Secondary | ICD-10-CM | POA: Diagnosis not present

## 2016-06-24 DIAGNOSIS — M50122 Cervical disc disorder at C5-C6 level with radiculopathy: Secondary | ICD-10-CM | POA: Diagnosis not present

## 2016-06-24 DIAGNOSIS — Z888 Allergy status to other drugs, medicaments and biological substances status: Secondary | ICD-10-CM | POA: Insufficient documentation

## 2016-06-24 DIAGNOSIS — Z91041 Radiographic dye allergy status: Secondary | ICD-10-CM | POA: Insufficient documentation

## 2016-06-24 DIAGNOSIS — M4722 Other spondylosis with radiculopathy, cervical region: Secondary | ICD-10-CM | POA: Insufficient documentation

## 2016-06-24 DIAGNOSIS — E1143 Type 2 diabetes mellitus with diabetic autonomic (poly)neuropathy: Secondary | ICD-10-CM | POA: Insufficient documentation

## 2016-06-24 DIAGNOSIS — Z87891 Personal history of nicotine dependence: Secondary | ICD-10-CM | POA: Diagnosis not present

## 2016-06-24 DIAGNOSIS — E11311 Type 2 diabetes mellitus with unspecified diabetic retinopathy with macular edema: Secondary | ICD-10-CM | POA: Insufficient documentation

## 2016-06-24 DIAGNOSIS — M50121 Cervical disc disorder at C4-C5 level with radiculopathy: Secondary | ICD-10-CM | POA: Diagnosis not present

## 2016-06-24 DIAGNOSIS — J449 Chronic obstructive pulmonary disease, unspecified: Secondary | ICD-10-CM | POA: Insufficient documentation

## 2016-06-24 DIAGNOSIS — Z794 Long term (current) use of insulin: Secondary | ICD-10-CM | POA: Diagnosis not present

## 2016-06-24 DIAGNOSIS — F329 Major depressive disorder, single episode, unspecified: Secondary | ICD-10-CM | POA: Diagnosis not present

## 2016-06-24 DIAGNOSIS — K3184 Gastroparesis: Secondary | ICD-10-CM | POA: Diagnosis not present

## 2016-06-24 DIAGNOSIS — M5030 Other cervical disc degeneration, unspecified cervical region: Secondary | ICD-10-CM | POA: Diagnosis not present

## 2016-06-24 DIAGNOSIS — Z981 Arthrodesis status: Secondary | ICD-10-CM | POA: Diagnosis not present

## 2016-06-24 HISTORY — DX: Personal history of urinary calculi: Z87.442

## 2016-06-24 HISTORY — PX: ANTERIOR CERVICAL DECOMP/DISCECTOMY FUSION: SHX1161

## 2016-06-24 HISTORY — DX: Unspecified asthma, uncomplicated: J45.909

## 2016-06-24 LAB — TYPE AND SCREEN
ABO/RH(D): O POS
Antibody Screen: NEGATIVE

## 2016-06-24 LAB — HEMOGLOBIN A1C
Hgb A1c MFr Bld: 6.5 % — ABNORMAL HIGH (ref 4.8–5.6)
Mean Plasma Glucose: 140 mg/dL

## 2016-06-24 LAB — BASIC METABOLIC PANEL
Anion gap: 8 (ref 5–15)
BUN: 13 mg/dL (ref 6–20)
CALCIUM: 8.9 mg/dL (ref 8.9–10.3)
CO2: 24 mmol/L (ref 22–32)
CREATININE: 0.94 mg/dL (ref 0.61–1.24)
Chloride: 105 mmol/L (ref 101–111)
GFR calc Af Amer: >60 mL/min (ref >60)
GLUCOSE: 218 mg/dL — AB (ref 65–99)
Potassium: 4.4 mmol/L (ref 3.5–5.1)
Sodium: 137 mmol/L (ref 135–145)

## 2016-06-24 LAB — CBC
HCT: 42.8 % (ref 39.0–52.0)
Hemoglobin: 15.6 g/dL (ref 13.0–17.0)
MCH: 32.4 pg (ref 26.0–34.0)
MCHC: 36.4 g/dL — AB (ref 30.0–36.0)
MCV: 88.8 fL (ref 78.0–100.0)
PLATELETS: 236 10*3/uL (ref 150–400)
RBC: 4.82 MIL/uL (ref 4.22–5.81)
RDW: 12.1 % (ref 11.5–15.5)
WBC: 7.7 10*3/uL (ref 4.0–10.5)

## 2016-06-24 LAB — SURGICAL PCR SCREEN
MRSA, PCR: NEGATIVE
STAPHYLOCOCCUS AUREUS: POSITIVE — AB

## 2016-06-24 LAB — GLUCOSE, CAPILLARY
Glucose-Capillary: 209 mg/dL — ABNORMAL HIGH (ref 65–99)
Glucose-Capillary: 185 mg/dL — ABNORMAL HIGH (ref 65–99)
Glucose-Capillary: 208 mg/dL — ABNORMAL HIGH (ref 65–99)
Glucose-Capillary: 216 mg/dL — ABNORMAL HIGH (ref 65–99)
Glucose-Capillary: 217 mg/dL — ABNORMAL HIGH (ref 65–99)
Glucose-Capillary: 219 mg/dL — ABNORMAL HIGH (ref 65–99)
Glucose-Capillary: 177 mg/dL — ABNORMAL HIGH (ref 65–99)

## 2016-06-24 LAB — ABO/RH: ABO/RH(D): O POS

## 2016-06-24 SURGERY — ANTERIOR CERVICAL DECOMPRESSION/DISCECTOMY FUSION 2 LEVEL/HARDWARE REMOVAL
Anesthesia: General

## 2016-06-24 MED ORDER — ONDANSETRON HCL 4 MG/2ML IJ SOLN: 4.0000 mg | Freq: Once | INTRAMUSCULAR | Status: DC | PRN

## 2016-06-24 MED ORDER — EPHEDRINE SULFATE 50 MG/ML IJ SOLN
INTRAMUSCULAR | Status: DC | PRN
  Administered 2016-06-24: 15 mg via INTRAVENOUS

## 2016-06-24 MED ORDER — ONDANSETRON HCL 4 MG PO TABS: 4.0000 mg | ORAL_TABLET | Freq: Four times a day (QID) | ORAL | Status: DC | PRN

## 2016-06-24 MED ORDER — KETOROLAC TROMETHAMINE 30 MG/ML IJ SOLN
INTRAMUSCULAR | Status: AC
  Filled 2016-06-24: qty 1

## 2016-06-24 MED ORDER — LIDOCAINE-EPINEPHRINE (PF) 2 %-1:200000 IJ SOLN
INTRAMUSCULAR | Status: DC | PRN
  Administered 2016-06-24: 10 mL via INTRADERMAL

## 2016-06-24 MED ORDER — VANCOMYCIN HCL IN DEXTROSE 1-5 GM/200ML-% IV SOLN
INTRAVENOUS | Status: AC
  Filled 2016-06-24: qty 200

## 2016-06-24 MED ORDER — ALUM & MAG HYDROXIDE-SIMETH 200-200-20 MG/5ML PO SUSP: 30.0000 mL | Freq: Four times a day (QID) | ORAL | Status: DC | PRN

## 2016-06-24 MED ORDER — MUPIROCIN 2 % EX OINT
1.0000 | TOPICAL_OINTMENT | Freq: Once | CUTANEOUS | Status: AC
Start: 2016-06-24 — End: 2016-06-24
  Administered 2016-06-24: 1 via TOPICAL

## 2016-06-24 MED ORDER — HYDROXYZINE HCL 50 MG/ML IM SOLN: 50.0000 mg | INTRAMUSCULAR | Status: DC | PRN

## 2016-06-24 MED ORDER — ARTIFICIAL TEARS OP OINT
TOPICAL_OINTMENT | OPHTHALMIC | Status: AC
  Filled 2016-06-24: qty 7

## 2016-06-24 MED ORDER — FENTANYL CITRATE (PF) 100 MCG/2ML IJ SOLN
INTRAMUSCULAR | Status: DC | PRN
  Administered 2016-06-24: 200 ug via INTRAVENOUS
  Administered 2016-06-24 (×2): 50 ug via INTRAVENOUS

## 2016-06-24 MED ORDER — PHENYLEPHRINE HCL 10 MG/ML IJ SOLN
INTRAMUSCULAR | Status: DC | PRN
  Administered 2016-06-24 (×2): 80 ug via INTRAVENOUS

## 2016-06-24 MED ORDER — MIDAZOLAM HCL 5 MG/5ML IJ SOLN
INTRAMUSCULAR | Status: DC | PRN
  Administered 2016-06-24: 2 mg via INTRAVENOUS

## 2016-06-24 MED ORDER — ONDANSETRON HCL 4 MG/2ML IJ SOLN
INTRAMUSCULAR | Status: AC
  Filled 2016-06-24: qty 4

## 2016-06-24 MED ORDER — ROCURONIUM BROMIDE 100 MG/10ML IV SOLN
INTRAVENOUS | Status: DC | PRN
  Administered 2016-06-24: 50 mg via INTRAVENOUS

## 2016-06-24 MED ORDER — PHENYLEPHRINE 40 MCG/ML (10ML) SYRINGE FOR IV PUSH (FOR BLOOD PRESSURE SUPPORT)
PREFILLED_SYRINGE | INTRAVENOUS | Status: AC
  Filled 2016-06-24: qty 10

## 2016-06-24 MED ORDER — PROPOFOL 10 MG/ML IV BOLUS
INTRAVENOUS | Status: AC
  Filled 2016-06-24: qty 20

## 2016-06-24 MED ORDER — BUPIVACAINE HCL (PF) 0.5 % IJ SOLN
INTRAMUSCULAR | Status: DC | PRN
  Administered 2016-06-24: 10 mL via PERINEURAL

## 2016-06-24 MED ORDER — ZOLPIDEM TARTRATE 5 MG PO TABS: 5.0000 mg | ORAL_TABLET | Freq: Every evening | ORAL | Status: DC | PRN

## 2016-06-24 MED ORDER — ACETAMINOPHEN 650 MG RE SUPP: 650.0000 mg | RECTAL | Status: DC | PRN

## 2016-06-24 MED ORDER — SUGAMMADEX SODIUM 200 MG/2ML IV SOLN
INTRAVENOUS | Status: DC | PRN
  Administered 2016-06-24: 160.6 mg via INTRAVENOUS

## 2016-06-24 MED ORDER — VARENICLINE TARTRATE 1 MG PO TABS
1.0000 mg | ORAL_TABLET | Freq: Two times a day (BID) | ORAL | Status: DC
Start: 2016-06-24 — End: 2016-06-25
  Administered 2016-06-24: 1 mg via ORAL
  Filled 2016-06-24 (×2): qty 1

## 2016-06-24 MED ORDER — LIDOCAINE-EPINEPHRINE (PF) 2 %-1:200000 IJ SOLN
INTRAMUSCULAR | Status: AC
Start: 2016-06-24 — End: 2016-06-24
  Filled 2016-06-24: qty 20

## 2016-06-24 MED ORDER — ALBUTEROL SULFATE (2.5 MG/3ML) 0.083% IN NEBU: 2.5000 mg | INHALATION_SOLUTION | Freq: Four times a day (QID) | RESPIRATORY_TRACT | Status: DC | PRN

## 2016-06-24 MED ORDER — HYDROCODONE-ACETAMINOPHEN 5-325 MG PO TABS: 1.0000 | ORAL_TABLET | ORAL | Status: DC | PRN

## 2016-06-24 MED ORDER — DIAZEPAM 5 MG/ML IJ SOLN
INTRAMUSCULAR | Status: DC
Start: 2016-06-24 — End: 2016-06-25
  Filled 2016-06-24: qty 2

## 2016-06-24 MED ORDER — SODIUM CHLORIDE 0.9 % IR SOLN
Status: DC | PRN
  Administered 2016-06-24: 09:00:00

## 2016-06-24 MED ORDER — GELATIN ABSORBABLE MT POWD
OROMUCOSAL | Status: DC | PRN
  Administered 2016-06-24: 09:00:00 via TOPICAL

## 2016-06-24 MED ORDER — SUCCINYLCHOLINE CHLORIDE 20 MG/ML IJ SOLN
INTRAMUSCULAR | Status: DC | PRN
  Administered 2016-06-24: 120 mg via INTRAVENOUS

## 2016-06-24 MED ORDER — ACETAMINOPHEN 10 MG/ML IV SOLN
INTRAVENOUS | Status: AC
  Filled 2016-06-24: qty 100

## 2016-06-24 MED ORDER — KETOROLAC TROMETHAMINE 30 MG/ML IJ SOLN
30.0000 mg | Freq: Once | INTRAMUSCULAR | Status: AC
  Administered 2016-06-24: 30 mg via INTRAVENOUS

## 2016-06-24 MED ORDER — DIAZEPAM 5 MG/ML IJ SOLN
2.5000 mg | INTRAMUSCULAR | Status: AC | PRN
  Administered 2016-06-24 (×2): 2.5 mg via INTRAVENOUS

## 2016-06-24 MED ORDER — CEFAZOLIN SODIUM-DEXTROSE 2-3 GM-% IV SOLR
INTRAVENOUS | Status: DC | PRN
  Administered 2016-06-24: 2 g via INTRAVENOUS

## 2016-06-24 MED ORDER — MUPIROCIN 2 % EX OINT: 1.0000 "application " | TOPICAL_OINTMENT | Freq: Two times a day (BID) | CUTANEOUS | Status: DC

## 2016-06-24 MED ORDER — ACETAMINOPHEN 325 MG PO TABS: 650.0000 mg | ORAL_TABLET | ORAL | Status: DC | PRN

## 2016-06-24 MED ORDER — CHLORHEXIDINE GLUCONATE CLOTH 2 % EX PADS: 6.0000 | MEDICATED_PAD | Freq: Once | CUTANEOUS | Status: DC

## 2016-06-24 MED ORDER — MUPIROCIN 2 % EX OINT
TOPICAL_OINTMENT | CUTANEOUS | Status: AC
  Administered 2016-06-24: 1 via TOPICAL
  Filled 2016-06-24: qty 22

## 2016-06-24 MED ORDER — LIDOCAINE HCL (CARDIAC) 20 MG/ML IV SOLN
INTRAVENOUS | Status: DC | PRN
  Administered 2016-06-24: 100 mg via INTRAVENOUS

## 2016-06-24 MED ORDER — SIMVASTATIN 40 MG PO TABS
40.0000 mg | ORAL_TABLET | Freq: Every day | ORAL | Status: DC
  Filled 2016-06-24: qty 1

## 2016-06-24 MED ORDER — THROMBIN 20000 UNITS EX SOLR
CUTANEOUS | Status: DC | PRN
  Administered 2016-06-24: 09:00:00 via TOPICAL

## 2016-06-24 MED ORDER — INSULIN PUMP
Freq: Three times a day (TID) | SUBCUTANEOUS | Status: DC
  Filled 2016-06-24: qty 1

## 2016-06-24 MED ORDER — HYDROMORPHONE HCL 1 MG/ML IJ SOLN: 0.2500 mg | INTRAMUSCULAR | Status: DC | PRN

## 2016-06-24 MED ORDER — 0.9 % SODIUM CHLORIDE (POUR BTL) OPTIME
TOPICAL | Status: DC | PRN
  Administered 2016-06-24: 1000 mL

## 2016-06-24 MED ORDER — VANCOMYCIN HCL IN DEXTROSE 1-5 GM/200ML-% IV SOLN: 1000.0000 mg | INTRAVENOUS | Status: DC

## 2016-06-24 MED ORDER — MEPERIDINE HCL 25 MG/ML IJ SOLN: 6.2500 mg | INTRAMUSCULAR | Status: DC | PRN

## 2016-06-24 MED ORDER — ARTIFICIAL TEARS OP OINT
TOPICAL_OINTMENT | OPHTHALMIC | Status: DC | PRN
  Administered 2016-06-24: 1 via OPHTHALMIC

## 2016-06-24 MED ORDER — ONDANSETRON HCL 4 MG/2ML IJ SOLN: 4.0000 mg | Freq: Four times a day (QID) | INTRAMUSCULAR | Status: DC | PRN

## 2016-06-24 MED ORDER — TRAZODONE HCL 150 MG PO TABS
150.0000 mg | ORAL_TABLET | Freq: Every day | ORAL | Status: DC
  Filled 2016-06-24: qty 1

## 2016-06-24 MED ORDER — MORPHINE SULFATE (PF) 4 MG/ML IV SOLN
4.0000 mg | INTRAVENOUS | Status: DC | PRN
  Administered 2016-06-24: 4 mg via INTRAMUSCULAR
  Filled 2016-06-24: qty 1

## 2016-06-24 MED ORDER — CYCLOBENZAPRINE HCL 10 MG PO TABS
10.0000 mg | ORAL_TABLET | Freq: Three times a day (TID) | ORAL | Status: DC | PRN
  Administered 2016-06-24: 10 mg via ORAL
  Filled 2016-06-24: qty 1

## 2016-06-24 MED ORDER — KETOROLAC TROMETHAMINE 30 MG/ML IJ SOLN
30.0000 mg | Freq: Four times a day (QID) | INTRAMUSCULAR | Status: DC
  Administered 2016-06-24: 30 mg via INTRAVENOUS
  Filled 2016-06-24: qty 1

## 2016-06-24 MED ORDER — ACETAMINOPHEN 10 MG/ML IV SOLN
INTRAVENOUS | Status: DC | PRN
  Administered 2016-06-24: 1000 mg via INTRAVENOUS

## 2016-06-24 MED ORDER — HYDROXYZINE HCL 25 MG PO TABS: 50.0000 mg | ORAL_TABLET | ORAL | Status: DC | PRN

## 2016-06-24 MED ORDER — ONDANSETRON HCL 4 MG/2ML IJ SOLN
INTRAMUSCULAR | Status: DC | PRN
  Administered 2016-06-24: 4 mg via INTRAVENOUS

## 2016-06-24 MED ORDER — OXYCODONE-ACETAMINOPHEN 5-325 MG PO TABS: 1.0000 | ORAL_TABLET | ORAL | 0 refills | Status: DC | PRN

## 2016-06-24 MED ORDER — ALBUTEROL SULFATE HFA 108 (90 BASE) MCG/ACT IN AERS: 2.0000 | INHALATION_SPRAY | Freq: Four times a day (QID) | RESPIRATORY_TRACT | Status: DC | PRN

## 2016-06-24 MED ORDER — GLYCOPYRROLATE 0.2 MG/ML IJ SOLN
INTRAMUSCULAR | Status: DC | PRN
  Administered 2016-06-24: .2 mg via INTRAVENOUS

## 2016-06-24 MED ORDER — DEXTROSE 5 % IV SOLN
INTRAVENOUS | Status: DC | PRN
  Administered 2016-06-24: 50 ug/min via INTRAVENOUS

## 2016-06-24 MED ORDER — MENTHOL 3 MG MT LOZG: 1.0000 | LOZENGE | OROMUCOSAL | Status: DC | PRN

## 2016-06-24 MED ORDER — OXYCODONE-ACETAMINOPHEN 5-325 MG PO TABS
1.0000 | ORAL_TABLET | ORAL | Status: DC | PRN
  Administered 2016-06-24 (×2): 2 via ORAL
  Filled 2016-06-24 (×2): qty 2

## 2016-06-24 MED ORDER — THROMBIN 5000 UNITS EX SOLR
CUTANEOUS | Status: AC
  Filled 2016-06-24: qty 5000

## 2016-06-24 MED ORDER — MAGNESIUM HYDROXIDE 400 MG/5ML PO SUSP
30.0000 mL | Freq: Every day | ORAL | Status: DC | PRN
Start: 2016-06-24 — End: 2016-06-25

## 2016-06-24 MED ORDER — LIDOCAINE 2% (20 MG/ML) 5 ML SYRINGE
INTRAMUSCULAR | Status: AC
  Filled 2016-06-24: qty 10

## 2016-06-24 MED ORDER — PROPOFOL 10 MG/ML IV BOLUS
INTRAVENOUS | Status: DC | PRN
  Administered 2016-06-24: 200 mg via INTRAVENOUS

## 2016-06-24 MED ORDER — FENTANYL CITRATE (PF) 100 MCG/2ML IJ SOLN
INTRAMUSCULAR | Status: AC
  Filled 2016-06-24: qty 2

## 2016-06-24 MED ORDER — GLYCOPYRROLATE 0.2 MG/ML IV SOSY
PREFILLED_SYRINGE | INTRAVENOUS | Status: AC
  Filled 2016-06-24: qty 3

## 2016-06-24 MED ORDER — BISACODYL 10 MG RE SUPP: 10.0000 mg | Freq: Every day | RECTAL | Status: DC | PRN

## 2016-06-24 MED ORDER — SUCCINYLCHOLINE CHLORIDE 200 MG/10ML IV SOSY
PREFILLED_SYRINGE | INTRAVENOUS | Status: AC
  Filled 2016-06-24: qty 10

## 2016-06-24 MED ORDER — SODIUM CHLORIDE 0.9% FLUSH: 3.0000 mL | INTRAVENOUS | Status: DC | PRN

## 2016-06-24 MED ORDER — FENTANYL CITRATE (PF) 100 MCG/2ML IJ SOLN
INTRAMUSCULAR | Status: AC
  Filled 2016-06-24: qty 4

## 2016-06-24 MED ORDER — ROCURONIUM BROMIDE 10 MG/ML (PF) SYRINGE
PREFILLED_SYRINGE | INTRAVENOUS | Status: AC
  Filled 2016-06-24: qty 20

## 2016-06-24 MED ORDER — SODIUM CHLORIDE 0.9% FLUSH
3.0000 mL | Freq: Two times a day (BID) | INTRAVENOUS | Status: DC
  Administered 2016-06-24: 3 mL via INTRAVENOUS

## 2016-06-24 MED ORDER — SODIUM CHLORIDE 0.9 % IV SOLN: INTRAVENOUS | Status: DC

## 2016-06-24 MED ORDER — CEFAZOLIN SODIUM-DEXTROSE 2-4 GM/100ML-% IV SOLN
INTRAVENOUS | Status: AC
  Filled 2016-06-24: qty 100

## 2016-06-24 MED ORDER — GABAPENTIN 100 MG PO CAPS
100.0000 mg | ORAL_CAPSULE | Freq: Three times a day (TID) | ORAL | Status: DC
  Administered 2016-06-24 (×2): 100 mg via ORAL
  Filled 2016-06-24: qty 1

## 2016-06-24 MED ORDER — THROMBIN 20000 UNITS EX SOLR
CUTANEOUS | Status: AC
Start: 2016-06-24 — End: 2016-06-24
  Filled 2016-06-24: qty 20000

## 2016-06-24 MED ORDER — MIDAZOLAM HCL 2 MG/2ML IJ SOLN
INTRAMUSCULAR | Status: AC
  Filled 2016-06-24: qty 2

## 2016-06-24 MED ORDER — DIAZEPAM 5 MG PO TABS: 2.5000 mg | ORAL_TABLET | Freq: Two times a day (BID) | ORAL | 0 refills | Status: DC | PRN

## 2016-06-24 MED ORDER — PHENOL 1.4 % MT LIQD: 1.0000 | OROMUCOSAL | Status: DC | PRN

## 2016-06-24 MED ORDER — EPHEDRINE 5 MG/ML INJ
INTRAVENOUS | Status: AC
  Filled 2016-06-24: qty 10

## 2016-06-24 MED ORDER — LACTATED RINGERS IV SOLN
INTRAVENOUS | Status: DC | PRN
  Administered 2016-06-24 (×2): via INTRAVENOUS

## 2016-06-24 SURGICAL SUPPLY — 60 items
ADH SKN CLS APL DERMABOND .7 (GAUZE/BANDAGES/DRESSINGS) ×1
ALLOGRAFT CA 6X14X11 (Bone Implant) ×2 IMPLANT
BAG DECANTER FOR FLEXI CONT (MISCELLANEOUS) ×2 IMPLANT
BIT DRILL 14X2.5XNS TI ANT (BIT) IMPLANT
BIT DRILL AVIATOR 14 (BIT) ×2
BIT DRILL NEURO 2X3.1 SFT TUCH (MISCELLANEOUS) ×1 IMPLANT
BIT DRL 14X2.5XNS TI ANT (BIT) ×1
BLADE ULTRA TIP 2M (BLADE) ×2 IMPLANT
CANISTER SUCT 3000ML PPV (MISCELLANEOUS) ×2 IMPLANT
CARTRIDGE OIL MAESTRO DRILL (MISCELLANEOUS) ×1 IMPLANT
COVER MAYO STAND STRL (DRAPES) ×2 IMPLANT
DECANTER SPIKE VIAL GLASS SM (MISCELLANEOUS) ×1 IMPLANT
DERMABOND ADVANCED (GAUZE/BANDAGES/DRESSINGS) ×1
DERMABOND ADVANCED .7 DNX12 (GAUZE/BANDAGES/DRESSINGS) ×1 IMPLANT
DIFFUSER DRILL AIR PNEUMATIC (MISCELLANEOUS) ×2 IMPLANT
DRAPE HALF SHEET 40X57 (DRAPES) IMPLANT
DRAPE LAPAROTOMY 100X72 PEDS (DRAPES) ×2 IMPLANT
DRAPE MICROSCOPE LEICA (MISCELLANEOUS) ×2 IMPLANT
DRAPE POUCH INSTRU U-SHP 10X18 (DRAPES) ×2 IMPLANT
DRILL NEURO 2X3.1 SOFT TOUCH (MISCELLANEOUS) ×2
ELECT COATED BLADE 2.86 ST (ELECTRODE) ×2 IMPLANT
ELECT REM PT RETURN 9FT ADLT (ELECTROSURGICAL) ×2
ELECTRODE REM PT RTRN 9FT ADLT (ELECTROSURGICAL) ×1 IMPLANT
GAUZE SPONGE 4X4 16PLY XRAY LF (GAUZE/BANDAGES/DRESSINGS) ×1 IMPLANT
GLOVE BIOGEL PI IND STRL 8 (GLOVE) ×1 IMPLANT
GLOVE BIOGEL PI INDICATOR 8 (GLOVE) ×1
GLOVE ECLIPSE 7.5 STRL STRAW (GLOVE) ×2 IMPLANT
GLOVE EXAM NITRILE LRG STRL (GLOVE) IMPLANT
GLOVE EXAM NITRILE XL STR (GLOVE) IMPLANT
GLOVE EXAM NITRILE XS STR PU (GLOVE) IMPLANT
GOWN STRL REUS W/ TWL LRG LVL3 (GOWN DISPOSABLE) IMPLANT
GOWN STRL REUS W/ TWL XL LVL3 (GOWN DISPOSABLE) IMPLANT
GOWN STRL REUS W/TWL 2XL LVL3 (GOWN DISPOSABLE) IMPLANT
GOWN STRL REUS W/TWL LRG LVL3 (GOWN DISPOSABLE) ×2
GOWN STRL REUS W/TWL XL LVL3 (GOWN DISPOSABLE) ×2
HALTER HD/CHIN CERV TRACTION D (MISCELLANEOUS) ×2 IMPLANT
HEMOSTAT POWDER KIT SURGIFOAM (HEMOSTASIS) ×2 IMPLANT
HEMOSTAT POWDER SURGIFOAM 1G (HEMOSTASIS) ×1 IMPLANT
KIT BASIN OR (CUSTOM PROCEDURE TRAY) ×2 IMPLANT
KIT ROOM TURNOVER OR (KITS) ×2 IMPLANT
NDL HYPO 25X1 1.5 SAFETY (NEEDLE) ×1 IMPLANT
NDL SPNL 22GX3.5 QUINCKE BK (NEEDLE) ×1 IMPLANT
NEEDLE HYPO 25X1 1.5 SAFETY (NEEDLE) ×2 IMPLANT
NEEDLE SPNL 22GX3.5 QUINCKE BK (NEEDLE) ×2 IMPLANT
NS IRRIG 1000ML POUR BTL (IV SOLUTION) ×2 IMPLANT
OIL CARTRIDGE MAESTRO DRILL (MISCELLANEOUS) ×2
PACK LAMINECTOMY NEURO (CUSTOM PROCEDURE TRAY) ×2 IMPLANT
PAD ARMBOARD 7.5X6 YLW CONV (MISCELLANEOUS) ×6 IMPLANT
PLATE AVIATOR ASSY 2LVL SZ 37 (Plate) ×1 IMPLANT
RUBBERBAND STERILE (MISCELLANEOUS) ×4 IMPLANT
SCREW AVIAT VAR SLFTAP 4.35X14 (Screw) ×2 IMPLANT
SCREW AVIATOR VAR SELFTAP 4X14 (Screw) ×4 IMPLANT
SPONGE INTESTINAL PEANUT (DISPOSABLE) ×2 IMPLANT
SPONGE SURGIFOAM ABS GEL 100 (HEMOSTASIS) ×2 IMPLANT
STAPLER SKIN PROX WIDE 3.9 (STAPLE) IMPLANT
SUT VIC AB 2-0 CP2 18 (SUTURE) ×2 IMPLANT
SUT VIC AB 3-0 SH 8-18 (SUTURE) ×2 IMPLANT
TOWEL OR 17X24 6PK STRL BLUE (TOWEL DISPOSABLE) ×2 IMPLANT
TOWEL OR 17X26 10 PK STRL BLUE (TOWEL DISPOSABLE) ×2 IMPLANT
WATER STERILE IRR 1000ML POUR (IV SOLUTION) ×2 IMPLANT

## 2016-06-24 NOTE — Anesthesia Postprocedure Evaluation (Signed)
Anesthesia Post Note  Patient: James [name]  Procedure(s) Performed: Procedure(s) (LRB): ANTERIOR CERVICAL DECOMPRESSION/DISCECTOMY FUSION CERVICAL FOUR - CERVICAL FIVE, CERVICAL FIVE - CERVICAL SIX; REMOVAL TETHER CERVICAL PLATE (N/A)  Patient location during evaluation: PACU Anesthesia Type: General Level of consciousness: awake and alert Pain management: pain level controlled Vital Signs Assessment: post-procedure vital signs reviewed and stable Respiratory status: spontaneous breathing, nonlabored ventilation, respiratory function stable and patient connected to nasal cannula oxygen Cardiovascular status: blood pressure returned to baseline and stable Postop Assessment: no signs of nausea or vomiting Anesthetic complications: no    Last Vitals:  Vitals:   06/24/16 1145 06/24/16 1211  BP: 130/72 136/66  Pulse: (!) 101 100  Resp: 18 18  Temp:  36.7 C    Last Pain:  Vitals:   06/24/16 1117  TempSrc:   PainSc: [name]

## 2016-06-24 NOTE — Discharge Instructions (Signed)
Wound Care °Leave incision open to air. °You may shower. °Do not scrub directly on incision.  °Do not put any creams, lotions, or ointments on incision. °Activity °Walk each and every [place], increasing distance each [place]. °No lifting greater than 5 lbs.  Avoid excessive neck motion. °No driving for 2 weeks; may ride as a passenger locally. °Wear neck brace at all times except when showering.  If provided soft collar, may wear for comfort unless otherwise instructed. °Diet °Resume your normal diet.  °Return to Work °Will be discussed at you follow up appointment. °Call Your Doctor If Any of These Occur °Redness, drainage, or swelling at the wound.  °Temperature greater than 101 degrees. °Severe pain not relieved by pain medication. °Increased difficulty swallowing. °Incision starts to come apart. °Follow Up Appt °Call today for appointment in 3 weeks ([phone]) or for problems.  If you have any hardware placed in your spine, you will need an x-ray before your appointment. °  ° °

## 2016-06-24 NOTE — Progress Notes (Signed)
Patient's CBG on arrival to PACU was 216.  Per Dr. Sherwood Gambler, patient is to be hooked back up to insulin pump on arrival to floor.  No additional treatment ordered.

## 2016-06-24 NOTE — Progress Notes (Signed)
Discharge instructions/education/AVS given to patient with wife at bedside and they both expressed understanding. Patient voiding well, MAE, and walked  in hallway with no c/o. Patient swallowing well and ate his dinner with no problem. No swelling, no redness, no drainage noted on incision site. Discharge to home, patient still waiting for his transport.

## 2016-06-24 NOTE — H&P (Signed)
Subjective: Patient is a 60 y.o. right handed white male who is admitted for treatment of C4-5 and C5-6 cervical disc herniations. Patient had previous C6-7 ACDF in 2001 is healed well. He has presented with pain to his upper extremities bilaterally, with associated numbness.  He is admitted now for 2 level C4-5 and C5-6 anterior cervical decompression arthrodesis with structural allograft and cervical plating.   Patient Active Problem List   Diagnosis Date Noted  . Hypoglycemia   . Syncope 05/20/2016  . Tobacco abuse 05/20/2016  . Cervical disc disorder with radiculopathy of cervical region 05/20/2016  . Hypoglycemia due to type 1 diabetes mellitus ([place]) 05/20/2016  . CAP (community acquired pneumonia) 12/22/2015  . Community acquired pneumonia   . Gastroparesis 02/10/2011  . Esophageal reflux 12/21/2010  . TOBACCO USE, QUIT 05/08/2010  . CONTUSION, RIGHT CHEST WALL 04/07/2010  . Chronic asthmatic bronchitis ([place]) 10/25/2008  . ASTHMA 09/06/2008  . COUGH 09/06/2008  . SHOULDER PAIN, RIGHT 08/19/2008  . BLADDER OUTLET OBSTRUCTION 04/15/2008  . RASH AND OTHER NONSPECIFIC SKIN ERUPTION 02/21/2008  . CARPAL TUNNEL SYNDROME, BILATERAL 07/31/2007  . ALLERGIC RHINITIS 07/31/2007  . RENAL INSUFFICIENCY 07/31/2007  . OTHER TENOSYNOVITIS OF HAND AND WRIST 07/31/2007  . 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 HLD (hyperlipidemia) 04/04/2007  . Insulin dependent diabetes mellitus ([place]) 04/03/2007  . DIABETIC RETINOPATHY, PROLIFERATIVE 04/03/2007  . ANXIETY 04/03/2007  . DEPRESSION 04/03/2007  . 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 CAD (coronary artery disease) 04/03/2007  . DYSHIDROSIS 04/03/2007   Past Medical History:  Diagnosis Date  . ANXIETY 04/03/2007  . Anxiety   . ASTHMA 09/06/2008  . Asthma   . ASTHMATIC BRONCHITIS, ACUTE 10/25/2008  . Bladder neck  obstruction   . CARPAL TUNNEL SYNDROME, BILATERAL 07/31/2007   issues resolved, no surgery  . Cervical disc disease   . Chronic bronchitis ([place])    "get it about q yr" (02/12/2014)  . COPD (chronic obstructive pulmonary disease) ([place])   . CORONARY ARTERY DISEASE 04/03/2007  . DEPRESSION 04/03/2007  . Depression   . DIABETES MELLITUS, TYPE I 04/03/2007  . Diabetic retinopathy associated with diabetes mellitus due to underlying condition ([place]) 04/03/2007  . DM W/EYE MANIFESTATIONS, TYPE I, UNCONTROLLED 04/04/2007  . 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 ED (erectile dysfunction)   . History of kidney stones   . HYPERLIPIDEMIA 04/04/2007  . Pneumonia    "several times and again today" (02/13/2104)  . Renal insufficiency   . Seizures ([place])    "insulin seizure from time to time; none in the last couple years" (02/12/2014)  . Spinal stenosis     Past Surgical History:  Procedure Laterality Date  . ANTERIOR CERVICAL DECOMP/DISCECTOMY FUSION  2000   "couple screws and a plate"  . APPENDECTOMY    . BACK SURGERY    . CARDIAC CATHETERIZATION  1990's  . CATARACT EXTRACTION W/ INTRAOCULAR LENS  IMPLANT, BILATERAL Bilateral   . CYSTOSCOPY WITH RETROGRADE PYELOGRAM, URETEROSCOPY AND STENT PLACEMENT Bilateral 04/06/2013   Procedure: BILATERAL CYSTOSCOPY WITH RETROGRADE PYELOGRAMS, STENT PLACEMENTS AND LEFT URETEROSCOPY AND STONE REMOVAL;  Surgeon: Alexis Frock, MD;  Location: [facility];  Service: Urology;  Laterality: Bilateral;  . CYSTOSCOPY WITH STENT PLACEMENT Right 04/12/2013   Procedure: CYSTOSCOPY WITH STENT PLACEMENT;  Surgeon: Alexis Frock, MD;  Location: [facility];  Service: Urology;  Laterality: Right;  . CYSTOSCOPY/RETROGRADE/URETEROSCOPY Bilateral 04/12/2013  Procedure: CYSTOSCOPY/RETROGRADE/URETEROSCOPY;  Surgeon: Alexis Frock, MD;  Location: [facility];  Service: Urology;  Laterality: Bilateral;  RIGHT RETROGRADE   . HOLMIUM LASER APPLICATION Left [id number]   Procedure: HOLMIUM  LASER APPLICATION;  Surgeon: Alexis Frock, MD;  Location: [facility];  Service: Urology;  Laterality: Left;  . LYMPH NODE DISSECTION  ~ 1960   groin  . stress cardiolite  09/06/2002  . TONSILLECTOMY    . VITRECTOMY Bilateral     Prescriptions Prior to Admission  Medication Sig Dispense Refill Last Dose  . albuterol (PROVENTIL) (2.5 MG/3ML) 0.083% nebulizer solution Take 2.5 mg by nebulization every 6 (six) hours as needed for wheezing or shortness of breath.   Past Week at Unknown time  . albuterol (VENTOLIN HFA) 108 (90 BASE) MCG/ACT inhaler Inhale 2 puffs into the lungs every 6 (six) hours as needed for wheezing or shortness of breath. 1 Inhaler 5 Past Week at Unknown time  . cetirizine (ZYRTEC) 10 MG tablet Take 10 mg by mouth daily as needed for allergies.   Past Week at Unknown time  . gabapentin (NEURONTIN) 100 MG capsule Take 1 capsule (100 mg total) by mouth 3 (three) times daily. 90 capsule 0 06/23/2016 at Unknown time  . ibuprofen (ADVIL,MOTRIN) 200 MG tablet Take 800 mg by mouth every 8 (eight) hours as needed for headache, mild pain or moderate pain.    Past Week at Unknown time  . Insulin Human (INSULIN PUMP) SOLN Pt uses Humalog 100 units/mL.   Taking  . naproxen sodium (ANAPROX) 220 MG tablet Take 440 mg by mouth 2 (two) times daily as needed (pain).   Past Week at Unknown time  . simvastatin (ZOCOR) 40 MG tablet Take 40 mg by mouth at bedtime.   5 06/23/2016 at Unknown time  . sodium chloride (OCEAN) 0.65 % SOLN nasal spray Place 1 spray into both nostrils as needed for congestion. 15 mL 0 Past Week at Unknown time  . traZODone (DESYREL) 150 MG tablet Take 150 mg by mouth at bedtime.  5 Past Week at Unknown time  . varenicline (CHANTIX) 1 MG tablet Take 1 mg by mouth 2 (two) times daily.   Past Week at Unknown time  . acetaminophen (TYLENOL) 500 MG tablet Take 1,000 mg by mouth every 6 (six) hours as needed for mild pain or headache.    More than a month at Unknown time  . guaiFENesin  (MUCINEX) 600 MG 12 hr tablet Take 1 tablet (600 mg total) by mouth 2 (two) times daily. (Patient not taking: Reported on 06/09/2016) 30 tablet 0 Not Taking   Allergies  Allergen Reactions  . Augmentin [Amoxicillin-Pot Clavulanate] Nausea And Vomiting and Other (See Comments)    Has patient had a PCN reaction causing immediate rash, facial/tongue/throat swelling, SOB or lightheadedness with hypotension:  No  Has patient had a PCN reaction causing severe rash involving mucus membranes or skin necrosis:  No Has patient had a PCN reaction that required hospitalization No Has patient had a PCN reaction occurring within the last 10 years: No If all of the above answers are "NO", then may proceed with Cephalosporin use.  . Iohexol Hives and Other (See Comments)    Reaction:  Dizziness/sweating    . Ivp Dye [Iodinated Diagnostic Agents] Hives and Other (See Comments)    Reaction:  Dizziness/sweating   . Lexapro [Escitalopram Oxalate] Itching    Social History  Substance Use Topics  . Smoking status: Former Smoker    Packs/[place]: 0.00  Types: Cigarettes    Quit date: 03/02/2015  . Smokeless tobacco: Never Used     Comment: resumed smoking in fall of 2017  . Alcohol use No    Family History  Problem Relation Age of Onset  . Stroke Father     strong FH cerebrovascular disease  . Diabetes Brother   . Heart disease Brother     CHF  . Cancer Mother   . Colon cancer Neg Hx      Review of Systems A comprehensive review of systems was negative.  Objective: Vital signs in last 24 hours: Temp:  [97.8 F (36.6 C)] 97.8 F (36.6 C) (12/07 0628) Pulse Rate:  [78] 78 (12/07 0628) Resp:  [20] 20 (12/07 0628) BP: (102)/(64) 102/64 (12/07 0628) SpO2:  [96 %] 96 % (12/07 0628) Weight:  [80.3 kg (177 lb)] 80.3 kg (177 lb) (12/07 [id number])  EXAM: Patient well-developed well-nourished white male in no acute distress. Lungs are clear to auscultation , the patient has symmetrical respiratory  excursion. Heart has a regular rate and rhythm normal S1 and S2 no murmur.   Abdomen is soft nontender nondistended bowel sounds are present. Extremity examination shows no clubbing cyanosis or edema. Motor examination shows 5 over 5 strength in the upper extremities including the deltoid biceps triceps and intrinsics and grip. Sensation is intact to pinprick throughout the digits of the upper extremities. Reflexes are symmetrical and without evidence of pathologic reflexes. Patient has a normal gait and stance.   Data Review:CBC    Component Value Date/Time   WBC 7.7 06/24/2016 0645   RBC 4.82 06/24/2016 0645   HGB 15.6 06/24/2016 0645   HCT 42.8 06/24/2016 0645   PLT 236 06/24/2016 0645   MCV 88.8 06/24/2016 0645   MCH 32.4 06/24/2016 0645   MCHC 36.4 (H) 06/24/2016 0645   RDW 12.1 06/24/2016 0645   LYMPHSABS 0.5 (L) 02/12/2014 1517   MONOABS 0.5 02/12/2014 1517   EOSABS 0.1 02/12/2014 1517   BASOSABS 0.0 02/12/2014 1517                          BMET    Component Value Date/Time   NA 142 05/21/2016 0447   K 3.8 05/21/2016 0447   CL 112 (H) 05/21/2016 0447   CO2 24 05/21/2016 0447   GLUCOSE 62 (L) 05/21/2016 0447   BUN 12 05/21/2016 0447   CREATININE 0.74 05/21/2016 0447   CALCIUM 8.2 (L) 05/21/2016 0447   GFRNONAA >60 05/21/2016 0447   GFRAA >60 05/21/2016 0447     Assessment/Plan: Patient with neck pain and bowel cervical radiculopathy secondary to cervical discolorations at the C4-5 and C5-6 levels, who is admitted now for a 2 level ACDF. We will plan on removing his existing anterior cervical plate at the D34-534 level.  I've discussed with the patient the nature of his condition, the nature the surgical procedure, the typical length of surgery, hospital stay, and overall recuperation. We discussed limitations postoperatively. I discussed risks of surgery including risks of infection, bleeding, possibly need for transfusion, the risk of nerve root dysfunction with pain,  weakness, numbness, or paresthesias, the risk of spinal cord dysfunction with paralysis of all 4 limbs and quadriplegia, and the risk of dural tear and CSF leakage and possible need for further surgery, the risk of esophageal dysfunction causing dysphagia and the risk of laryngeal dysfunction causing hoarseness of the voice, the risk of failure of the arthrodesis and the  possible need for further surgery, and the risk of anesthetic complications including myocardial infarction, stroke, pneumonia, and death. We also discussed the need for postoperative immobilization in a cervical collar. Understanding all this the patient does wish to proceed with surgery and is admitted for such.    Hosie Spangle, MD 06/24/2016 7:19 AM

## 2016-06-24 NOTE — Anesthesia Procedure Notes (Signed)
Procedure Name: Intubation Date/Time: 06/24/2016 7:35 AM Performed by: Neldon Newport Pre-anesthesia Checklist: Timeout performed, Patient being monitored, Suction available, Emergency Drugs available and Patient identified Patient Re-evaluated:Patient Re-evaluated prior to inductionOxygen Delivery Method: Circle system utilized Preoxygenation: Pre-oxygenation with 100% oxygen Intubation Type: IV induction, Rapid sequence and Cricoid Pressure applied Laryngoscope Size: Mac and 3 Grade View: Grade I Tube type: Oral Tube size: 7.5 mm Number of attempts: 1 Placement Confirmation: breath sounds checked- equal and bilateral,  positive ETCO2 and ETT inserted through vocal cords under direct vision Secured at: 23 cm Tube secured with: Tape Dental Injury: Teeth and Oropharynx as per pre-operative assessment

## 2016-06-24 NOTE — Op Note (Signed)
06/24/2016  10:38 AM  PATIENT:  James [name]  60 y.o. male  PRE-OPERATIVE DIAGNOSIS:  C4-5 and C5-6 herniated nucleus pulposus, cervical; cervical spondylosis; cervical degenerative disease; cervical radiculopathy; cervicalgia  POST-OPERATIVE DIAGNOSIS:  C4-5 and C5-6 herniated nucleus pulposus, cervical; cervical spondylosis; cervical degenerative disease; cervical radiculopathy; cervicalgia  PROCEDURE:  Procedure(s):  1)  REMOVAL TETHER CERVICAL PLATE CERVICAL SIX - CERVICAL SEVEN;  2) ANTERIOR CERVICAL DECOMPRESSION/DISCECTOMY AND ARTHRODESIS CERVICAL FOUR - CERVICAL FIVE & CERVICAL FIVE - CERVICAL SIX;   SURGEON:  Surgeon(s): Jovita Gamma, MD Kary Kos, MD  ASSISTANTS: Kary Kos, M.D.  ANESTHESIA:   general  EBL:  Total I/O In: 1600 [I.V.:1600] Out: 100 [Blood:100]  BLOOD ADMINISTERED:none  COUNT: Correct per nursing staff  DICTATION: Patient was brought to the operating room placed under general endotracheal anesthesia. Patient was placed in 10 pounds of halter traction. The neck was prepped with Betadine soap and solution and draped in a sterile fashion. A oblique incision was made on the left side of the neck, paralleling the anterior border of the left sternocleidomastoid. The line of the incision was infiltrated with local anesthetic with epinephrine. Dissection was carried down thru the subcutaneous tissue and platysma, bipolar cautery was used to maintain hemostasis. Dissection was then carried out thru an avascular plane leaving the sternocleidomastoid carotid artery and jugular vein laterally and the trachea and esophagus medially. The ventral aspect of the vertebral column was identified and the patient's existing C6-7 anterior cervical plate was identified. We were thereby able to identify the C4-5 and C5-6 levels.   Prevertebral scar tissue overlying the existing anterior cervical plate was carefully divided, and then we removed the existing tether cervical plate. Each  of the 4 screws was removed, and then the plate itself was removed. Each screw hole was filled with Surgifoam to establish hemostasis. Bipolar cautery and electrocautery were used to coagulate bleeding points from the scar tissue.  We then proceeded with the 2 level anterior cervical decompression arthrodesis. The annulus at the C4-5 and C5-6 levels was incised and each disc space entered. Discectomy was performed with micro-curettes and pituitary rongeurs. We removed anterior osteophytic overgrowth at each level. The operating microscope was draped and brought into the field provided additional magnification illumination and visualization. Discectomy was continued posteriorly thru the disc space and then the cartilaginous endplate was removed using micro-curettes along with the high-speed drill. Posterior osteophytic overgrowth was removed each level using the high-speed drill along with a 2 mm thin footplated Kerrison punch. Posterior longitudinal ligament along with disc herniation was carefully removed, decompressing the spinal canal and thecal sac. We then continued to remove osteophytic overgrowth and disc material decompressing the neural foramina and exiting nerve roots bilaterally. Once the decompression was completed hemostasis was established at each level with the use of Gelfoam with thrombin and bipolar cautery. The Gelfoam was removed, a thin layer of Surgifoam was applied, the wound irrigated and hemostasis confirmed. We then measured the height of the intravertebral disc space level and selected a 6 millimeter in height structural allograft for the C4-5 level and a 6 millimeter in height structural allograft for the C5-6 level . Each was hydrated and saline solution and then gently positioned in the intravertebral disc space and countersunk. We then selected a 37 millimeter in height Aviator cervical plate. It was positioned over the fusion construct and secured to the vertebra. We used the  existing screw holes at C6, with a pair of 4.35 x 14 mm self-tapping  variable screws at the C6 level, a pair of 4 x 14 mm self-tapping variable screws at the C5 level, and a pair of 4 x 14 mm self-tapping variable screws at the C4 level. The screw holes at C4 and C5 were hand drilled, and the screws placed. Once all the screws were placed, final tightening was performed, and then the locking system was secured. The wound was irrigated with bacitracin solution checked for hemostasis which was established and confirmed. An x-ray was taken which showed the grafts in good position, the plate and screws in good position, and the overall alignment to be good. We then proceeded with closure. The platysma was closed with interrupted inverted 2-0 undyed Vicryl suture, the subcutaneous and subcuticular closed with interrupted inverted 3-0 undyed Vicryl suture. The skin edges were approximated with Dermabond. Following surgery the patient was taken out of cervical traction. To be reversed and the anesthetic and taken to the recovery room for further care.  PLAN OF CARE: Admit for overnight observation  PATIENT DISPOSITION:  PACU - hemodynamically stable.   Delay start of Pharmacological VTE agent (>24hrs) due to surgical blood loss or risk of bleeding:  yes

## 2016-06-24 NOTE — Transfer of Care (Signed)
Immediate Anesthesia Transfer of Care Note  Patient: James [name]  Procedure(s) Performed: Procedure(s) with comments: ANTERIOR CERVICAL DECOMPRESSION/DISCECTOMY FUSION CERVICAL FOUR - CERVICAL FIVE, CERVICAL FIVE - CERVICAL SIX; REMOVAL TETHER CERVICAL PLATE (N/A) - ANTERIOR CERVICAL DECOMPRESSION/DISCECTOMY FUSION CERVICAL FOUR - CERVICAL FIVE, CERVICAL FIVE - CERVICAL SIX; REMOVAL TETHER CERVICAL PLATE  Patient Location: PACU  Anesthesia Type:General  Level of Consciousness: awake, alert  and oriented  Airway & Oxygen Therapy: Patient Spontanous Breathing and Patient connected to face mask oxygen  Post-op Assessment: Report given to RN, Post -op Vital signs reviewed and stable and Patient moving all extremities X 4  Post vital signs: Reviewed and stable  Last Vitals:  Vitals:   06/24/16 0628  BP: 102/64  Pulse: 78  Resp: 20  Temp: 36.6 C    Last Pain:  Vitals:   06/24/16 0628  TempSrc: Oral      Patients Stated Pain Goal: 2 ([id number])  Complications: No apparent anesthesia complications

## 2016-06-24 NOTE — Anesthesia Preprocedure Evaluation (Addendum)
Anesthesia Evaluation  Patient identified by MRN, date of birth, ID band Patient awake    Reviewed: Allergy & Precautions, NPO status , Patient's Chart, lab work & pertinent test results  Airway Mallampati: I  TM Distance: >3 FB Neck ROM: Full    Dental  (+) Poor Dentition, Dental Advidsory Given, Missing   Pulmonary asthma , COPD,  COPD inhaler, former smoker,    Pulmonary exam normal  + wheezing      Cardiovascular + CAD  Normal cardiovascular exam Rhythm:regular     Neuro/Psych PSYCHIATRIC DISORDERS Anxiety Depression  Neuromuscular disease    GI/Hepatic GERD  Medicated and Poorly Controlled,  Endo/Other  diabetes, Type 1, Insulin Dependent  Renal/GU      Musculoskeletal   Abdominal   Peds  Hematology   Anesthesia Other Findings   Reproductive/Obstetrics                           Anesthesia Physical Anesthesia Plan  ASA: III  Anesthesia Plan: General   Post-op Pain Management:    Induction: Intravenous  Airway Management Planned: Oral ETT  Additional Equipment:   Intra-op Plan:   Post-operative Plan: Extubation in OR  Informed Consent: I have reviewed the patients History and Physical, chart, labs and discussed the procedure including the risks, benefits and alternatives for the proposed anesthesia with the patient or authorized representative who has indicated his/her understanding and acceptance.   Dental Advisory Given  Plan Discussed with: CRNA and Surgeon  Anesthesia Plan Comments:        Anesthesia Quick Evaluation

## 2016-06-24 NOTE — Discharge Summary (Signed)
Physician Discharge Summary  Patient ID: James [name] MRN: [id number] DOB/AGE: Feb 16, 1956 61 y.o.  Admit date: 06/24/2016 Discharge date: 06/24/2016  Admission Diagnoses:  C4-5 and C5-6 herniated nucleus pulposus, cervical; cervical spondylosis; cervical degenerative disease; cervical radiculopathy; cervicalgia  Discharge Diagnoses:  C4-5 and C5-6 herniated nucleus pulposus, cervical; cervical spondylosis; cervical degenerative disease; cervical radiculopathy; cervicalgia Active Problems:   HNP (herniated nucleus pulposus), cervical   Discharged Condition: good  Hospital Course: Patient was admitted, underwent a C4-5 and C5-6 ACDF. We also removed the existing C6-7 anterior cervical plate. Postoperatively he has done well. He is up and ambulating actively. He is voiding well. His wound is healing nicely. It is clean and dry; there is no erythema, swelling, or drainage. He is wearing his soft cervical collar. He is moving all 4 extremities well. He is insulin pump was restarted, and his most recent Accu-Chek was 177. He is asking to be discharged home. We have given him instructions regarding wound care and activities following discharge. He is scheduled to follow-up with me in the office in about 3 weeks.  Discharge Exam: Blood pressure 117/66, pulse 94, temperature 98.6 F (37 C), resp. rate 18, height 5\' 9"  (1.753 m), weight 80.3 kg (177 lb), SpO2 97 %.  Disposition: 01-Home or Self Care     Medication List    STOP taking these medications   guaiFENesin 600 MG 12 hr tablet Commonly known as:  MUCINEX     TAKE these medications   acetaminophen 500 MG tablet Commonly known as:  TYLENOL Take 1,000 mg by mouth every 6 (six) hours as needed for mild pain or headache.   albuterol (2.5 MG/3ML) 0.083% nebulizer solution Commonly known as:  PROVENTIL Take 2.5 mg by nebulization every 6 (six) hours as needed for wheezing or shortness of breath.   albuterol 108 (90 Base) MCG/ACT  inhaler Commonly known as:  VENTOLIN HFA Inhale 2 puffs into the lungs every 6 (six) hours as needed for wheezing or shortness of breath.   cetirizine 10 MG tablet Commonly known as:  ZYRTEC Take 10 mg by mouth daily as needed for allergies.   diazepam 5 MG tablet Commonly known as:  VALIUM Take 0.5-1 tablets (2.5-5 mg total) by mouth every 12 (twelve) hours as needed for anxiety.   gabapentin 100 MG capsule Commonly known as:  NEURONTIN Take 1 capsule (100 mg total) by mouth 3 (three) times daily.   ibuprofen 200 MG tablet Commonly known as:  ADVIL,MOTRIN Take 800 mg by mouth every 8 (eight) hours as needed for headache, mild pain or moderate pain.   insulin pump Soln Pt uses Humalog 100 units/mL.   naproxen sodium 220 MG tablet Commonly known as:  ANAPROX Take 440 mg by mouth 2 (two) times daily as needed (pain).   oxyCODONE-acetaminophen 5-325 MG tablet Commonly known as:  PERCOCET/ROXICET Take 1-2 tablets by mouth every 4 (four) hours as needed (pain).   simvastatin 40 MG tablet Commonly known as:  ZOCOR Take 40 mg by mouth at bedtime.   sodium chloride 0.65 % Soln nasal spray Commonly known as:  OCEAN Place 1 spray into both nostrils as needed for congestion.   traZODone 150 MG tablet Commonly known as:  DESYREL Take 150 mg by mouth at bedtime.   varenicline 1 MG tablet Commonly known as:  CHANTIX Take 1 mg by mouth 2 (two) times daily.        SignedHosie Spangle 06/24/2016, 6:34 PM

## 2016-06-25 ENCOUNTER — Ambulatory Visit: Payer: Self-pay | Admitting: *Deleted

## 2016-06-25 ENCOUNTER — Encounter (HOSPITAL_COMMUNITY): Payer: Self-pay | Admitting: Neurosurgery

## 2016-06-25 ENCOUNTER — Other Ambulatory Visit: Payer: Self-pay | Admitting: *Deleted

## 2016-06-25 NOTE — Patient Outreach (Signed)
First attempt for transition of care phone outreach. Duval had C4-5 C5-6 surgery on 12/7 due to herniated disk and degenerative disease and was discharged home the same Mikhail. Left message on patient's home number requesting a return call with an update. This RNCM will call patient again on 12/11 for another attempt to reach patient to assess recovery from surgery.  [name] RN,CCM,CDE [place] Management Coordinator [facility] Office Phone [phone] Office Fax [phone]

## 2016-06-28 ENCOUNTER — Other Ambulatory Visit: Payer: Self-pay | Admitting: *Deleted

## 2016-06-28 ENCOUNTER — Ambulatory Visit: Payer: Self-pay | Admitting: *Deleted

## 2016-06-28 NOTE — Patient Outreach (Signed)
[name], Nikolay's wife and  RN,  returned this RNCM's call to report that Demitrious is doing well post-op. States pain is well management with po analgesia. [name] says the only thing that is bothersome is Vontae's ongoing cough from lingering bronchitis and his inability to cough as forcefully as he'd like.  Will plan to call Carlton again in one week to assess his ongoing recovery from cervical spine surgery that occurred on 06/24/16. [name] RN,CCM,CDE [place] Management Coordinator [facility] Office Phone [phone] Office Fax [phone]

## 2016-07-02 ENCOUNTER — Other Ambulatory Visit: Payer: Self-pay | Admitting: *Deleted

## 2016-07-02 NOTE — Patient Outreach (Signed)
Spoke with James [name] via home phone to complete the  second transition of care call and to ensure that James [name] called him to advise him that they will proceed with initiating the 670 G Medtronic closed hybrid insulin pump delivery system. HE says he has an appointment with James [name] on 08/04/16 for pump placement. James [name] says he is doing very well from surgery, has no complaints, has regained full feeling and strength in his hands. He expressed appreciation for the assistance of this RNCM in contacting  James [name] and for the follow up transition of care calls. Will plan to call him again next week for 3 rd transition of are call.  [name] RN,CCM,CDE [place] Management Coordinator [facility] Office Phone [phone] Office Fax [phone]

## 2016-07-02 NOTE — Patient Outreach (Signed)
Per Devynn's voice mail request this morning, called [name] office and left message for [facility] nurse, advising that Xayden would like to know if [name] agrees with prescribing the 670 G Insulin pump system since he could receive it at no cost since he is an active participant of the [facility] program. Await return call form [name] office, [name] RN,CCM,CDE [place] Management Coordinator [facility] Office Phone [phone] Office Fax [phone]

## 2016-07-04 ENCOUNTER — Encounter (HOSPITAL_COMMUNITY): Payer: Self-pay | Admitting: Emergency Medicine

## 2016-07-04 ENCOUNTER — Emergency Department (HOSPITAL_COMMUNITY): Payer: 59

## 2016-07-04 ENCOUNTER — Emergency Department (HOSPITAL_COMMUNITY)
Admission: EM | Admit: 2016-07-04 | Discharge: 2016-07-04 | Disposition: A | Discharge status: Home / Self Care | Payer: 59 | Attending: Emergency Medicine | Admitting: Emergency Medicine

## 2016-07-04 DIAGNOSIS — R05 Cough: Secondary | ICD-10-CM | POA: Diagnosis not present

## 2016-07-04 DIAGNOSIS — R509 Fever, unspecified: Secondary | ICD-10-CM | POA: Diagnosis not present

## 2016-07-04 DIAGNOSIS — Z87891 Personal history of nicotine dependence: Secondary | ICD-10-CM | POA: Insufficient documentation

## 2016-07-04 DIAGNOSIS — Z79899 Other long term (current) drug therapy: Secondary | ICD-10-CM | POA: Insufficient documentation

## 2016-07-04 DIAGNOSIS — I2510 Atherosclerotic heart disease of native coronary artery without angina pectoris: Secondary | ICD-10-CM | POA: Insufficient documentation

## 2016-07-04 DIAGNOSIS — M791 Myalgia, unspecified site: Secondary | ICD-10-CM

## 2016-07-04 DIAGNOSIS — R110 Nausea: Secondary | ICD-10-CM | POA: Insufficient documentation

## 2016-07-04 DIAGNOSIS — E10319 Type 1 diabetes mellitus with unspecified diabetic retinopathy without macular edema: Secondary | ICD-10-CM | POA: Diagnosis not present

## 2016-07-04 DIAGNOSIS — R0989 Other specified symptoms and signs involving the circulatory and respiratory systems: Secondary | ICD-10-CM | POA: Diagnosis not present

## 2016-07-04 DIAGNOSIS — J449 Chronic obstructive pulmonary disease, unspecified: Secondary | ICD-10-CM | POA: Diagnosis not present

## 2016-07-04 DIAGNOSIS — R059 Cough, unspecified: Secondary | ICD-10-CM

## 2016-07-04 LAB — URINALYSIS, ROUTINE W REFLEX MICROSCOPIC
Bacteria, UA: NONE SEEN
Bilirubin Urine: NEGATIVE
Glucose, UA: NEGATIVE mg/dL
Hgb urine dipstick: NEGATIVE
KETONES UR: 80 mg/dL — AB
Leukocytes, UA: NEGATIVE
Nitrite: NEGATIVE
PROTEIN: 100 mg/dL — AB
SPECIFIC GRAVITY, URINE: 1.018 (ref 1.005–1.030)
pH: 5 (ref 5.0–8.0)

## 2016-07-04 LAB — CBC WITH DIFFERENTIAL/PLATELET
BASOS ABS: 0 10*3/uL (ref 0.0–0.1)
BASOS PCT: 0 %
EOS ABS: 0.2 10*3/uL (ref 0.0–0.7)
Eosinophils Relative: 1 %
HCT: 45.2 % (ref 39.0–52.0)
Hemoglobin: 16.2 g/dL (ref 13.0–17.0)
Lymphocytes Relative: 7 %
Lymphs Abs: 0.9 10*3/uL (ref 0.7–4.0)
MCH: 32.5 pg (ref 26.0–34.0)
MCHC: 35.8 g/dL (ref 30.0–36.0)
MCV: 90.8 fL (ref 78.0–100.0)
MONO ABS: 0.6 10*3/uL (ref 0.1–1.0)
MONOS PCT: 4 %
NEUTROS PCT: 88 %
Neutro Abs: 11.5 10*3/uL — ABNORMAL HIGH (ref 1.7–7.7)
Platelets: 311 10*3/uL (ref 150–400)
RBC: 4.98 MIL/uL (ref 4.22–5.81)
RDW: 12.6 % (ref 11.5–15.5)
WBC: 13.2 10*3/uL — ABNORMAL HIGH (ref 4.0–10.5)

## 2016-07-04 LAB — CBG MONITORING, ED
GLUCOSE-CAPILLARY: 120 mg/dL — AB (ref 65–99)
GLUCOSE-CAPILLARY: 137 mg/dL — AB (ref 65–99)

## 2016-07-04 LAB — COMPREHENSIVE METABOLIC PANEL
ALBUMIN: 4 g/dL (ref 3.5–5.0)
ALK PHOS: 103 U/L (ref 38–126)
ALT: 31 U/L (ref 17–63)
ANION GAP: 10 (ref 5–15)
AST: 27 U/L (ref 15–41)
BILIRUBIN TOTAL: 1 mg/dL (ref 0.3–1.2)
BUN: 12 mg/dL (ref 6–20)
CALCIUM: 9.4 mg/dL (ref 8.9–10.3)
CO2: 24 mmol/L (ref 22–32)
Chloride: 103 mmol/L (ref 101–111)
Creatinine, Ser: 0.94 mg/dL (ref 0.61–1.24)
GFR calc Af Amer: >60 mL/min (ref >60)
GLUCOSE: 129 mg/dL — AB (ref 65–99)
Potassium: 4 mmol/L (ref 3.5–5.1)
Sodium: 137 mmol/L (ref 135–145)
TOTAL PROTEIN: 6.7 g/dL (ref 6.5–8.1)

## 2016-07-04 LAB — PROTIME-INR
INR: 0.94
PROTHROMBIN TIME: 12.6 s (ref 11.4–15.2)

## 2016-07-04 LAB — INFLUENZA PANEL BY PCR (TYPE A & B)
INFLBPCR: NEGATIVE
Influenza A By PCR: NEGATIVE

## 2016-07-04 LAB — I-STAT CG4 LACTIC ACID, ED: LACTIC ACID, VENOUS: 1.25 mmol/L (ref 0.5–1.9)

## 2016-07-04 MED ORDER — ACETAMINOPHEN 325 MG PO TABS
650.0000 mg | ORAL_TABLET | Freq: Once | ORAL | Status: AC
  Administered 2016-07-04: 650 mg via ORAL
  Filled 2016-07-04: qty 2

## 2016-07-04 MED ORDER — SODIUM CHLORIDE 0.9 % IV BOLUS (SEPSIS)
1000.0000 mL | Freq: Once | INTRAVENOUS | Status: AC
  Administered 2016-07-04: 1000 mL via INTRAVENOUS

## 2016-07-04 MED ORDER — ONDANSETRON HCL 4 MG/2ML IJ SOLN
4.0000 mg | Freq: Once | INTRAMUSCULAR | Status: AC
  Administered 2016-07-04: 4 mg via INTRAVENOUS
  Filled 2016-07-04: qty 2

## 2016-07-04 NOTE — ED Triage Notes (Signed)
C/o generalized pain all over, nausea, and chills that started this morning.  Pt had neck surgery 1 week ago.

## 2016-07-04 NOTE — ED Notes (Signed)
Dr. Lavena Bullion made aware of the pts rectal temperature and that the pt recently had surgery on his neck. No new orders at this time.

## 2016-07-04 NOTE — Discharge Instructions (Signed)
Continue to drink fluids in order to stay hydrated. He can take Tylenol or Motrin for symptoms is related to fever. If areas of your surgical site become increasingly painful or you have neck pain, call your neurosurgeon or return to the emergency room immediately.

## 2016-07-04 NOTE — ED Provider Notes (Signed)
[facility] DEPT Provider Note   CSN: [id number] Arrival date & time: 07/04/16  1621     History   Chief Complaint Chief Complaint  Patient presents with  . generalized pain  . Nausea    HPI James [name] is a 60 y.o. male.  The history is provided by the patient and medical records. No language interpreter was used.    Patient is a 60 year old male who presents today with fever, chills, congestion, cough that started earlier today. Patient states that this is gradually worsened throughout the [place]. He has not been wanting to drink or eat lunch as well. Patient notably underwent C-spine surgery on 06/24/2016. This facility. He denies any pain in his [place] or pain in his neck. No drainage from his wound site as well. He denies any hematuria or dysuria. Denies any rash. No increased tone in his lower extremity bilaterally. Denies any known sick contacts.    Past Medical History:  Diagnosis Date  . ANXIETY 04/03/2007  . Anxiety   . ASTHMA 09/06/2008  . Asthma   . ASTHMATIC BRONCHITIS, ACUTE 10/25/2008  . Bladder neck obstruction   . CARPAL TUNNEL SYNDROME, BILATERAL 07/31/2007   issues resolved, no surgery  . Cervical disc disease   . Chronic bronchitis ([place])    "get it about q yr" (02/12/2014)  . COPD (chronic obstructive pulmonary disease) ([place])   . CORONARY ARTERY DISEASE 04/03/2007  . DEPRESSION 04/03/2007  . Depression   . DIABETES MELLITUS, TYPE I 04/03/2007  . Diabetic retinopathy associated with diabetes mellitus due to underlying condition ([place]) 04/03/2007  . DM W/EYE MANIFESTATIONS, TYPE I, UNCONTROLLED 04/04/2007  . 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 ED (erectile dysfunction)   . History of kidney stones   . HYPERLIPIDEMIA 04/04/2007  . Pneumonia    "several times and again today" (02/13/2104)  . Renal insufficiency   . Seizures ([place])    "insulin seizure from time to time; none in the last couple years" (02/12/2014)  . Spinal stenosis      Patient Active Problem List   Diagnosis Date Noted  . HNP (herniated nucleus pulposus), cervical 06/24/2016  . Hypoglycemia   . Syncope 05/20/2016  . Tobacco abuse 05/20/2016  . Cervical disc disorder with radiculopathy of cervical region 05/20/2016  . Hypoglycemia due to type 1 diabetes mellitus ([place]) 05/20/2016  . CAP (community acquired pneumonia) 12/22/2015  . Community acquired pneumonia   . Gastroparesis 02/10/2011  . Esophageal reflux 12/21/2010  . TOBACCO USE, QUIT 05/08/2010  . CONTUSION, RIGHT CHEST WALL 04/07/2010  . Chronic asthmatic bronchitis ([place]) 10/25/2008  . ASTHMA 09/06/2008  . COUGH 09/06/2008  . SHOULDER PAIN, RIGHT 08/19/2008  . BLADDER OUTLET OBSTRUCTION 04/15/2008  . RASH AND OTHER NONSPECIFIC SKIN ERUPTION 02/21/2008  . CARPAL TUNNEL SYNDROME, BILATERAL 07/31/2007  . ALLERGIC RHINITIS 07/31/2007  . RENAL INSUFFICIENCY 07/31/2007  . OTHER TENOSYNOVITIS OF HAND AND WRIST 07/31/2007  . 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 HLD (hyperlipidemia) 04/04/2007  . Insulin dependent diabetes mellitus ([place]) 04/03/2007  . DIABETIC RETINOPATHY, PROLIFERATIVE 04/03/2007  . ANXIETY 04/03/2007  . DEPRESSION 04/03/2007  . 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 CAD (coronary artery disease) 04/03/2007  . DYSHIDROSIS 04/03/2007    Past Surgical History:  Procedure Laterality Date  . ANTERIOR CERVICAL DECOMP/DISCECTOMY FUSION  2000   "couple screws and a plate"  . ANTERIOR CERVICAL DECOMP/DISCECTOMY  FUSION N/A 06/24/2016   Procedure: ANTERIOR CERVICAL DECOMPRESSION/DISCECTOMY FUSION CERVICAL FOUR - CERVICAL FIVE, CERVICAL FIVE - CERVICAL SIX; REMOVAL TETHER CERVICAL PLATE;  Surgeon: Jovita Gamma, MD;  Location: [facility];  Service: Neurosurgery;  Laterality: N/A;  ANTERIOR CERVICAL DECOMPRESSION/DISCECTOMY FUSION CERVICAL FOUR - CERVICAL FIVE,  CERVICAL FIVE - CERVICAL SIX; REMOVAL TETHER CERVICAL PLATE  . APPENDECTOMY    . BACK SURGERY    . CARDIAC CATHETERIZATION  1990's  . CATARACT EXTRACTION W/ INTRAOCULAR LENS  IMPLANT, BILATERAL Bilateral   . CYSTOSCOPY WITH RETROGRADE PYELOGRAM, URETEROSCOPY AND STENT PLACEMENT Bilateral 04/06/2013   Procedure: BILATERAL CYSTOSCOPY WITH RETROGRADE PYELOGRAMS, STENT PLACEMENTS AND LEFT URETEROSCOPY AND STONE REMOVAL;  Surgeon: Alexis Frock, MD;  Location: [facility];  Service: Urology;  Laterality: Bilateral;  . CYSTOSCOPY WITH STENT PLACEMENT Right 04/12/2013   Procedure: CYSTOSCOPY WITH STENT PLACEMENT;  Surgeon: Alexis Frock, MD;  Location: [facility];  Service: Urology;  Laterality: Right;  . CYSTOSCOPY/RETROGRADE/URETEROSCOPY Bilateral 04/12/2013   Procedure: CYSTOSCOPY/RETROGRADE/URETEROSCOPY;  Surgeon: Alexis Frock, MD;  Location: [facility];  Service: Urology;  Laterality: Bilateral;  RIGHT RETROGRADE   . HOLMIUM LASER APPLICATION Left [id number]   Procedure: HOLMIUM LASER APPLICATION;  Surgeon: Alexis Frock, MD;  Location: [facility];  Service: Urology;  Laterality: Left;  . LYMPH NODE DISSECTION  ~ 1960   groin  . stress cardiolite  09/06/2002  . TONSILLECTOMY    . VITRECTOMY Bilateral        Home Medications    Prior to Admission medications   Medication Sig Start Date End Date Taking? Authorizing Provider  albuterol (PROVENTIL) (2.5 MG/3ML) 0.083% nebulizer solution Take 2.5 mg by nebulization every 6 (six) hours as needed for wheezing or shortness of breath.   Yes Historical Provider, MD  albuterol (VENTOLIN HFA) 108 (90 BASE) MCG/ACT inhaler Inhale 2 puffs into the lungs every 6 (six) hours as needed for wheezing or shortness of breath. 04/11/15  Yes Tammy S Parrett, NP  cetirizine (ZYRTEC) 10 MG tablet Take 10 mg by mouth daily as needed for allergies.   Yes Historical Provider, MD  diazepam (VALIUM) 5 MG tablet Take 0.5-1 tablets (2.5-5 mg total) by mouth every 12 (twelve) hours as  needed for anxiety. 06/24/16  Yes Jovita Gamma, MD  gabapentin (NEURONTIN) 100 MG capsule Take 1 capsule (100 mg total) by mouth 3 (three) times daily. 05/21/16  Yes Doreatha Lew, MD  ibuprofen (ADVIL,MOTRIN) 200 MG tablet Take 800 mg by mouth every 8 (eight) hours as needed for headache, mild pain or moderate pain.    Yes Historical Provider, MD  insulin lispro (HUMALOG) 100 UNIT/ML injection MedTronic pump   Yes Historical Provider, MD  naproxen sodium (ANAPROX) 220 MG tablet Take 440 mg by mouth 2 (two) times daily as needed (pain).   Yes Historical Provider, MD  simvastatin (ZOCOR) 40 MG tablet Take 40 mg by mouth at bedtime.    Yes Historical Provider, MD  sodium chloride (OCEAN) 0.65 % SOLN nasal spray Place 1 spray into both nostrils as needed for congestion. 05/21/16  Yes Doreatha Lew, MD  traZODone (DESYREL) 150 MG tablet Take 150 mg by mouth at bedtime.   Yes Historical Provider, MD  oxyCODONE-acetaminophen (PERCOCET/ROXICET) 5-325 MG tablet Take 1-2 tablets by mouth every 4 (four) hours as needed (pain). Patient not taking: Reported on 07/04/2016 06/24/16   Jovita Gamma, MD    Family History Family History  Problem Relation Age of Onset  . Stroke Father     strong FH  cerebrovascular disease  . Diabetes Brother   . Heart disease Brother     CHF  . Cancer Mother   . Colon cancer Neg Hx     Social History Social History  Substance Use Topics  . Smoking status: Former Smoker    Packs/Dunaway: 0.00    Types: Cigarettes    Quit date: 03/02/2015  . Smokeless tobacco: Never Used     Comment: resumed smoking in fall of 2017  . Alcohol use No     Allergies   Augmentin [amoxicillin-pot clavulanate]; Iohexol; Ivp dye [iodinated diagnostic agents]; and Lexapro [escitalopram oxalate]   Review of Systems Review of Systems  Constitutional: Positive for appetite change (decreased), chills and fever.  HENT: Positive for congestion. Negative for rhinorrhea.     Respiratory: Positive for cough, shortness of breath and wheezing.   Cardiovascular: Negative for chest pain.  Gastrointestinal: Negative for diarrhea, nausea and vomiting.  Genitourinary: Negative for dysuria and hematuria.  Musculoskeletal: Negative for neck pain and neck stiffness.  Skin: Positive for wound (L surgical wound, pt denies increased pain here).  Neurological: Negative for weakness and numbness.  Psychiatric/Behavioral: Negative for agitation and confusion.  All other systems reviewed and are negative.    Physical Exam Updated Vital Signs BP 102/61   Pulse 96   Temp 101.5 F (38.6 C) (Rectal)   Resp 19   Ht 5\' 9"  (1.753 m)   Wt 79.4 kg   SpO2 95%   BMI 25.84 kg/m   Physical Exam  Constitutional: He is oriented to person, place, and time. He appears ill.  HENT:  Head: Normocephalic and atraumatic.  Right Ear: External ear normal.  Left Ear: External ear normal.  Eyes: Conjunctivae and EOM are normal.  Neck: Normal range of motion. Neck supple.  L anterior surgical wound appears clean without erythema or drainage. No ttp. No midline C spine ttp.  Cardiovascular: Regular rhythm.  Tachycardia present.   Pulmonary/Chest: Effort normal and breath sounds normal. No respiratory distress. He has no wheezes.  Abdominal: Soft. He exhibits no distension. There is no tenderness.  Musculoskeletal: Normal range of motion. He exhibits no edema.  Neurological: He is alert and oriented to person, place, and time.  Skin: Skin is warm and dry. He is not diaphoretic.  Nursing note and vitals reviewed.    ED Treatments / Results  Labs (all labs ordered are listed, but only abnormal results are displayed) Labs Reviewed  COMPREHENSIVE METABOLIC PANEL - Abnormal; Notable for the following:       Result Value   Glucose, Bld 129 (*)    All other components within normal limits  CBC WITH DIFFERENTIAL/PLATELET - Abnormal; Notable for the following:    WBC 13.2 (*)    Neutro  Abs 11.5 (*)    All other components within normal limits  URINALYSIS, ROUTINE W REFLEX MICROSCOPIC - Abnormal; Notable for the following:    APPearance HAZY (*)    Ketones, ur 80 (*)    Protein, ur 100 (*)    Squamous Epithelial / LPF 0-5 (*)    All other components within normal limits  CBG MONITORING, ED - Abnormal; Notable for the following:    Glucose-Capillary 120 (*)    All other components within normal limits  CBG MONITORING, ED - Abnormal; Notable for the following:    Glucose-Capillary 137 (*)    All other components within normal limits  RESPIRATORY PANEL BY PCR  URINE CULTURE  PROTIME-INR  INFLUENZA PANEL BY  PCR (TYPE A & B, H1N1)  I-STAT CG4 LACTIC ACID, ED    EKG  EKG Interpretation None       Radiology Dg Chest 2 View  Result Date: 07/04/2016 CLINICAL DATA:  Chills with body aches, cough, nausea and headache for 1 week. Recent cervical spine fusion. Possible sepsis. EXAM: CHEST  2 VIEW COMPARISON:  Radiographs 05/20/2016. FINDINGS: The heart size and mediastinal contours are normal. The lungs are clear. There is no pleural effusion or pneumothorax. No acute osseous findings are identified. Old rib fractures on the left appear stable. Interval revision of lower cervical fusion now extending from C4-6. IMPRESSION: No active cardiopulmonary process. Electronically Signed   By: Richardean Sale M.D.   On: 07/04/2016 17:37    Procedures Procedures (including critical care time)  Medications Ordered in ED Medications  sodium chloride 0.9 % bolus 1,000 mL (0 mLs Intravenous Stopped 07/04/16 2102)  ondansetron (ZOFRAN) injection 4 mg (4 mg Intravenous Given 07/04/16 1840)  acetaminophen (TYLENOL) tablet 650 mg (650 mg Oral Given 07/04/16 1841)  sodium chloride 0.9 % bolus 1,000 mL (0 mLs Intravenous Stopped 07/04/16 2228)     Initial Impression / Assessment and Plan / ED Course  I have reviewed the triage vital signs and the nursing notes.  Pertinent labs &  imaging results that were available during my care of the patient were reviewed by me and considered in my medical decision making (see chart for details).  Clinical Course     Patient is a 60 year old male who presents today with fever, chills, cough, congestion. Exam seems consistent with viral-like process. No focal findings to suggest bacterial illness. I did obtain a respiratory viral panel which will take several hours to come back. Patient was given 2 L IV fluid and did have significant improvement in his overall symptoms. No evidence of urinary tract infection or lower respiratory tract infection. His surgical wounds appear clean and dry. He has no tenderness at the surgical site. I doubt that his current presentation is related to this.  Patient agreeable with discharge home with symptomatic care and return if his symptoms significantly worsen or he develops focal pain at the surgical site. Patient was discharged home in good condition.  Final Clinical Impressions(s) / ED Diagnoses   Final diagnoses:  Fever in adult  Cough  Nausea  Chest congestion  Myalgia    New Prescriptions Discharge Medication List as of 07/04/2016 10:32 PM       Theodosia Quay, MD [id number]    Delora Fuel, MD [id number]    David Glick, MD [id number]

## 2016-07-04 NOTE — ED Notes (Signed)
CBG 120 

## 2016-07-05 LAB — RESPIRATORY PANEL BY PCR
ADENOVIRUS-RVPPCR: NOT DETECTED
Bordetella pertussis: NOT DETECTED
CORONAVIRUS NL63-RVPPCR: NOT DETECTED
CORONAVIRUS OC43-RVPPCR: NOT DETECTED
Chlamydophila pneumoniae: NOT DETECTED
Coronavirus 229E: NOT DETECTED
Coronavirus HKU1: NOT DETECTED
Influenza A: NOT DETECTED
Influenza B: NOT DETECTED
METAPNEUMOVIRUS-RVPPCR: NOT DETECTED
MYCOPLASMA PNEUMONIAE-RVPPCR: NOT DETECTED
PARAINFLUENZA VIRUS 1-RVPPCR: NOT DETECTED
PARAINFLUENZA VIRUS 2-RVPPCR: NOT DETECTED
PARAINFLUENZA VIRUS 3-RVPPCR: NOT DETECTED
PARAINFLUENZA VIRUS 4-RVPPCR: NOT DETECTED
RHINOVIRUS / ENTEROVIRUS - RVPPCR: NOT DETECTED
Respiratory Syncytial Virus: NOT DETECTED

## 2016-07-06 ENCOUNTER — Other Ambulatory Visit: Payer: Self-pay | Admitting: *Deleted

## 2016-07-06 LAB — URINE CULTURE: CULTURE: NO GROWTH

## 2016-07-06 MED FILL — HYDROCODON-APAP 5-325: 5-325 | 5 days supply | Qty: 40 | Fill #0

## 2016-07-06 NOTE — Patient Outreach (Signed)
James [name] called to thank this RNCM with assisting him in getting another glucometer yesterday. He says he bought one at [name] but will pick up the glucometer [name] wants him to use today at the [name]. He also says he will meet with [name], pharmacist at [name]  office on 08/04/16 to get the 670 G insulin delivery system placed. [name] RN,CCM,CDE [place] Management Coordinator [facility] Office Phone [phone] Office Fax [phone]

## 2016-07-06 NOTE — Patient Outreach (Signed)
Returned call to James [name] after he left message stating he went to ED yesterday evening for a fever and cough and inadvertently left his glucometer and the ED staff is unable to locate it. Since he is on an insulin pump,  he is asking for this RNCM's help in obtaining him another one ASAP. Marland KitchenAfter consulting with the pharmacist at [facility], left message for James [name] to go to the [facility] pharmacy and obtain a new glucometer at no cost.   [name] RN,CCM,CDE [place] Management Coordinator [facility] [employer] [phone] Office Fax [phone]

## 2016-07-09 ENCOUNTER — Encounter (HOSPITAL_COMMUNITY): Payer: Self-pay | Admitting: Neurosurgery

## 2016-07-13 ENCOUNTER — Ambulatory Visit: Payer: Self-pay | Admitting: *Deleted

## 2016-07-13 ENCOUNTER — Other Ambulatory Visit: Payer: Self-pay | Admitting: *Deleted

## 2016-07-13 NOTE — Patient Outreach (Addendum)
4th transition of care call. Left message on Ladarrian's home number requesting return call for update on his ongoing recovery from cervical spine surgery on 06/24/16. Fleetwood returned call and stated he was doing OK  Regarding his recovery from cervical s[pine surgery. He states he is smoking again but wants to stop and needs the help of Chantix. He said the last time he tried to get the Chantix refilled it was denied by [employer] pharmacy benefit manager. Secure e-mail sent to Glade Nurse at the [facility] to determine how this RNCM might facilitate getting the Chantix refilled for [place]. Also, Jarod states his 59 year old son who is unemployed and uninsured, is a heroin addict and [facility]  for [facility] into their inpatient substance abuse program and is waiting to hear if he will be accepted. Zakaria asked if there are any other community resources available.  This RNCM contacted [facility] CM LCSW Scott Forrest and additional Theme park manager given to [employer] via [employer] on his home answering machine. Emotional support also provided to Ramah as he states this is adding a tremendous amount of stress to his life. [name] RN,CCM,CDE [place] Management Coordinator [facility] Office Phone [phone] Office Fax [phone]

## 2016-07-16 ENCOUNTER — Other Ambulatory Visit: Payer: Self-pay | Admitting: *Deleted

## 2016-07-16 MED FILL — CHANTIX 1 MG CONT MONTH BOX: 1 | 28 days supply | Qty: 56 | Fill #0

## 2016-07-16 NOTE — Patient Outreach (Signed)
Spoke with James [name] by phone to notify him that his Chantix refill is available at no cost at the [facility].  Also provided James [name] with the toll free number to Narcotics Anonymous [phone] to help him deal with the stress of his son who is struggling with heroin addiction and did not keep his appointment [facility] this morning at 9:00 am.  [name] RN,CCM,CDE [place] Management Coordinator [facility] Office Phone [phone] Office Fax [phone]

## 2016-07-20 DIAGNOSIS — M5030 Other cervical disc degeneration, unspecified cervical region: Secondary | ICD-10-CM | POA: Diagnosis not present

## 2016-07-20 DIAGNOSIS — Z9889 Other specified postprocedural states: Secondary | ICD-10-CM | POA: Diagnosis not present

## 2016-07-20 DIAGNOSIS — Z6826 Body mass index (BMI) 26.0-26.9, adult: Secondary | ICD-10-CM | POA: Diagnosis not present

## 2016-07-20 DIAGNOSIS — R030 Elevated blood-pressure reading, without diagnosis of hypertension: Secondary | ICD-10-CM | POA: Diagnosis not present

## 2016-07-20 DIAGNOSIS — M5020 Other cervical disc displacement, unspecified cervical region: Secondary | ICD-10-CM | POA: Diagnosis not present

## 2016-07-20 DIAGNOSIS — M4722 Other spondylosis with radiculopathy, cervical region: Secondary | ICD-10-CM | POA: Diagnosis not present

## 2016-07-27 DIAGNOSIS — E1065 Type 1 diabetes mellitus with hyperglycemia: Secondary | ICD-10-CM | POA: Diagnosis not present

## 2016-08-06 MED FILL — traZODone HCL 150 MG TABS: 150 | 30 days supply | Qty: 30 | Fill #1

## 2016-08-25 DIAGNOSIS — E1021 Type 1 diabetes mellitus with diabetic nephropathy: Secondary | ICD-10-CM | POA: Diagnosis not present

## 2016-08-25 DIAGNOSIS — E103599 Type 1 diabetes mellitus with proliferative diabetic retinopathy without macular edema, unspecified eye: Secondary | ICD-10-CM | POA: Diagnosis not present

## 2016-08-25 DIAGNOSIS — E1029 Type 1 diabetes mellitus with other diabetic kidney complication: Secondary | ICD-10-CM | POA: Diagnosis not present

## 2016-08-25 DIAGNOSIS — Z4681 Encounter for fitting and adjustment of insulin pump: Secondary | ICD-10-CM | POA: Diagnosis not present

## 2016-08-25 DIAGNOSIS — Z6824 Body mass index (BMI) 24.0-24.9, adult: Secondary | ICD-10-CM | POA: Diagnosis not present

## 2016-08-30 MED FILL — CHANTIX 1 MG CONT MONTH BOX: 1 | 28 days supply | Qty: 56 | Fill #1

## 2016-09-08 ENCOUNTER — Encounter (INDEPENDENT_AMBULATORY_CARE_PROVIDER_SITE_OTHER): Payer: Commercial Managed Care - PPO | Admitting: Ophthalmology

## 2016-09-08 ENCOUNTER — Encounter (INDEPENDENT_AMBULATORY_CARE_PROVIDER_SITE_OTHER): Payer: 59 | Admitting: Ophthalmology

## 2016-09-08 DIAGNOSIS — E11319 Type 2 diabetes mellitus with unspecified diabetic retinopathy without macular edema: Secondary | ICD-10-CM | POA: Diagnosis not present

## 2016-09-08 DIAGNOSIS — E113593 Type 2 diabetes mellitus with proliferative diabetic retinopathy without macular edema, bilateral: Secondary | ICD-10-CM | POA: Diagnosis not present

## 2016-09-08 MED FILL — traZODone HCL 150 MG TABS: 150 | 30 days supply | Qty: 30 | Fill #2

## 2016-09-17 DIAGNOSIS — Z9889 Other specified postprocedural states: Secondary | ICD-10-CM | POA: Diagnosis not present

## 2016-09-17 DIAGNOSIS — M4722 Other spondylosis with radiculopathy, cervical region: Secondary | ICD-10-CM | POA: Diagnosis not present

## 2016-09-17 DIAGNOSIS — M5030 Other cervical disc degeneration, unspecified cervical region: Secondary | ICD-10-CM | POA: Diagnosis not present

## 2016-09-17 DIAGNOSIS — M542 Cervicalgia: Secondary | ICD-10-CM | POA: Diagnosis not present

## 2016-09-17 DIAGNOSIS — R030 Elevated blood-pressure reading, without diagnosis of hypertension: Secondary | ICD-10-CM | POA: Diagnosis not present

## 2016-09-17 DIAGNOSIS — Z6827 Body mass index (BMI) 27.0-27.9, adult: Secondary | ICD-10-CM | POA: Diagnosis not present

## 2016-09-22 MED FILL — CONTOUR NEXT STRIPS: 30 days supply | Qty: 200 | Fill #0

## 2016-09-23 DIAGNOSIS — Z1389 Encounter for screening for other disorder: Secondary | ICD-10-CM | POA: Diagnosis not present

## 2016-09-23 DIAGNOSIS — R062 Wheezing: Secondary | ICD-10-CM | POA: Diagnosis not present

## 2016-09-23 DIAGNOSIS — E1029 Type 1 diabetes mellitus with other diabetic kidney complication: Secondary | ICD-10-CM | POA: Diagnosis not present

## 2016-09-23 DIAGNOSIS — I739 Peripheral vascular disease, unspecified: Secondary | ICD-10-CM | POA: Diagnosis not present

## 2016-09-23 DIAGNOSIS — N08 Glomerular disorders in diseases classified elsewhere: Secondary | ICD-10-CM | POA: Diagnosis not present

## 2016-09-23 DIAGNOSIS — I2510 Atherosclerotic heart disease of native coronary artery without angina pectoris: Secondary | ICD-10-CM | POA: Diagnosis not present

## 2016-09-23 DIAGNOSIS — D649 Anemia, unspecified: Secondary | ICD-10-CM | POA: Diagnosis not present

## 2016-09-23 DIAGNOSIS — E784 Other hyperlipidemia: Secondary | ICD-10-CM | POA: Diagnosis not present

## 2016-09-23 DIAGNOSIS — M50122 Cervical disc disorder at C5-C6 level with radiculopathy: Secondary | ICD-10-CM | POA: Diagnosis not present

## 2016-09-23 DIAGNOSIS — F329 Major depressive disorder, single episode, unspecified: Secondary | ICD-10-CM | POA: Diagnosis not present

## 2016-09-23 DIAGNOSIS — E113599 Type 2 diabetes mellitus with proliferative diabetic retinopathy without macular edema, unspecified eye: Secondary | ICD-10-CM | POA: Diagnosis not present

## 2016-09-23 MED FILL — AZITHROMYCIN 250 MG TABLET: 250 | 5 days supply | Qty: 6 | Fill #0

## 2016-09-23 MED FILL — HYDROCODONE-HOMATROPINE SYR: 5-1.5 | 30 days supply | Qty: 150 | Fill #0

## 2016-09-27 ENCOUNTER — Ambulatory Visit (INDEPENDENT_AMBULATORY_CARE_PROVIDER_SITE_OTHER): Payer: 59 | Admitting: Pulmonary Disease

## 2016-09-27 ENCOUNTER — Encounter: Payer: Self-pay | Admitting: Pulmonary Disease

## 2016-09-27 VITALS — BP 118/82 | HR 83 | Ht 69.0 in | Wt 167.4 lb

## 2016-09-27 DIAGNOSIS — R05 Cough: Secondary | ICD-10-CM

## 2016-09-27 DIAGNOSIS — J4541 Moderate persistent asthma with (acute) exacerbation: Secondary | ICD-10-CM

## 2016-09-27 DIAGNOSIS — R059 Cough, unspecified: Secondary | ICD-10-CM

## 2016-09-27 LAB — NITRIC OXIDE: Nitric Oxide: 56

## 2016-09-27 MED ORDER — PREDNISONE 10 MG PO TABS: ORAL_TABLET | ORAL | 0 refills | Status: DC

## 2016-09-27 MED ORDER — BUDESONIDE-FORMOTEROL FUMARATE 160-4.5 MCG/ACT IN AERO: 2.0000 | INHALATION_SPRAY | Freq: Two times a day (BID) | RESPIRATORY_TRACT | 6 refills | Status: DC

## 2016-09-27 MED FILL — predniSONE 10 MG TABS: 10 | 6 days supply | Qty: 12 | Fill #0

## 2016-09-27 NOTE — Patient Instructions (Signed)
Prednisone 10 mg pill >> 3 pills daily for 2 days, 2 pills daily for 2 days, 1 pill daily for 2 days  Continue symbicort 2 puffs twice per [place] >> call if you need a refill  Follow up with Dr. Elsworth Soho in 2 months

## 2016-09-27 NOTE — Progress Notes (Signed)
Current Outpatient Prescriptions on File Prior to Visit  Medication Sig  . albuterol (PROVENTIL) (2.5 MG/3ML) 0.083% nebulizer solution Take 2.5 mg by nebulization every 6 (six) hours as needed for wheezing or shortness of breath.  [name] albuterol (VENTOLIN HFA) 108 (90 BASE) MCG/ACT inhaler Inhale 2 puffs into the lungs every 6 (six) hours as needed for wheezing or shortness of breath.  . cetirizine (ZYRTEC) 10 MG tablet Take 10 mg by mouth daily as needed for allergies.  [name] insulin lispro (HUMALOG) 100 UNIT/ML injection MedTronic pump  . simvastatin (ZOCOR) 40 MG tablet Take 40 mg by mouth at bedtime.   . traZODone (DESYREL) 150 MG tablet Take 150 mg by mouth at bedtime.  . naproxen sodium (ANAPROX) 220 MG tablet Take 440 mg by mouth 2 (two) times daily as needed (pain).   No current facility-administered medications on file prior to visit.      Chief Complaint  Patient presents with  . Follow-up    Pt c/o increased productive cough with milky white mucus, SOB x 1-2 weeks ago. Saw PCP 09/23/16 and was given Depo and cough syrup Rx. Pt states that he has cough spasms and cannot breathe and SOB with very little exertion. Using Albuterol frequently throughout the [place].  Denies chest tightness/discomfort    Past medical history, Past surgical history, Family history, Social history, Allergies all reviewed.  Vital Signs BP 118/82 (BP Location: Left Arm, Cuff Size: Normal)   Pulse 83   Ht 5\' 9"  (1.753 m)   Wt 167 lb 6.4 oz (75.9 kg)   SpO2 96%   BMI 24.72 kg/m   History of Present Illness James [name] is a 61 y.o. male with asthma.  He is followed by Dr. Elsworth Soho.  He was seen by his PCP about 1 week ago with cough, wheeze, and congestion.  He was given steroid shot and cough medicine.  He was also started on symbicort sample.  He was given script for Abx but didn't feel he needed this, and didn't fill it.  He is still having cough, chest tightness, and wheeze.  He denies sinus congestion, sore  throat, fever, abdominal pain, swelling, or rash.  He did have nausea yesterday and was feeling hot for a while last night.  FeNO elevated today.  Physical Exam  General - No distress ENT - No sinus tenderness, no oral exudate, no LAN Cardiac - s1s2 regular, no murmur Chest - b/l expiratory wheeze Back - No focal tenderness Abd - Soft, non-tender Ext - No edema Neuro - Normal strength Skin - No rashes Psych - normal mood, and behavior   Assessment/Plan  Acute asthmatic bronchitis. - will give prednisone taper - continue symbicort for now - prn albuterol - defer CXR, Abx for now   Patient Instructions  Prednisone 10 mg pill >> 3 pills daily for 2 days, 2 pills daily for 2 days, 1 pill daily for 2 days  Continue symbicort 2 puffs twice per [place] >> call if you need a refill  Follow up with Dr. Elsworth Soho in 2 months     Chesley Mires, MD [place]/Sleep Pager:  [phone] 09/27/2016, 11:46 AM

## 2016-10-04 MED FILL — SIMVASTATIN 40 MG TABLET: 40 | 90 days supply | Qty: 90 | Fill #1

## 2016-10-04 MED FILL — CHANTIX 1 MG CONT MONTH BOX: 1 | 28 days supply | Qty: 56 | Fill #2

## 2016-10-11 ENCOUNTER — Other Ambulatory Visit: Payer: Self-pay | Admitting: Adult Health

## 2016-10-11 MED FILL — traZODone HCL 150 MG TABS: 150 | 30 days supply | Qty: 30 | Fill #3

## 2016-10-11 MED FILL — SYMBICORT 160-4.5 MCG INH: 160-4.5 | 30 days supply | Qty: 10 | Fill #0

## 2016-10-13 MED FILL — HumaLOG 100 UNIT/ML SOLN: 100 | 20 days supply | Qty: 20 | Fill #2

## 2016-10-14 MED FILL — VENTOLIN HFA 90 MCG INHALER: 108 (90 BAS | 25 days supply | Qty: 18 | Fill #0

## 2016-10-18 DIAGNOSIS — E1065 Type 1 diabetes mellitus with hyperglycemia: Secondary | ICD-10-CM | POA: Diagnosis not present

## 2016-11-04 DIAGNOSIS — E1065 Type 1 diabetes mellitus with hyperglycemia: Secondary | ICD-10-CM | POA: Diagnosis not present

## 2016-11-18 MED FILL — CHANTIX 1 MG CONT MONTH BOX: 1 | 28 days supply | Qty: 56 | Fill #3

## 2016-11-18 MED FILL — traZODone HCL 150 MG TABS: 150 | 30 days supply | Qty: 30 | Fill #4

## 2016-11-18 MED FILL — CONTOUR NEXT STRIPS: 30 days supply | Qty: 200 | Fill #1

## 2016-11-18 MED FILL — HumaLOG 100 UNIT/ML SOLN: 100 | 40 days supply | Qty: 40 | Fill #0

## 2016-11-29 ENCOUNTER — Ambulatory Visit (INDEPENDENT_AMBULATORY_CARE_PROVIDER_SITE_OTHER): Payer: 59 | Admitting: Pulmonary Disease

## 2016-11-29 ENCOUNTER — Encounter: Payer: Self-pay | Admitting: Pulmonary Disease

## 2016-11-29 DIAGNOSIS — Z720 Tobacco use: Secondary | ICD-10-CM | POA: Diagnosis not present

## 2016-11-29 DIAGNOSIS — J449 Chronic obstructive pulmonary disease, unspecified: Secondary | ICD-10-CM | POA: Diagnosis not present

## 2016-11-29 MED ORDER — BUDESONIDE-FORMOTEROL FUMARATE 160-4.5 MCG/ACT IN AERO: 1.0000 | INHALATION_SPRAY | Freq: Two times a day (BID) | RESPIRATORY_TRACT | 0 refills | Status: DC

## 2016-11-29 NOTE — Progress Notes (Signed)
   Subjective:    Patient ID: James [name], male    DOB: [birth year], 60 y.o.   MRN: [id number]  HPI  34 yowm For follow-up of chronic asthmatic bronchitis  He quit smoking 03/2015 with nl pfts 05/22/2015 off all rx C/w AB / not copd  Turns out he has chronic volume loss in his left lung on imaging, ? Congenital   PMhx >> HLD, Anxiety/depression, Spinal stenosis, CAD, DM type I with retinopathy, CKD   He had 2 flareups in the last year, 12/2015 and again in 09/2008 blood times needing antibiotic and prednisone He has done well since then. Breathing is at his baseline. He quit smoking in 2016. Denies sputum production Symbicort has become very expensive for him, his co-pay has doubled, but alternative brands did not work well He remains on an insulin pump, sugars running high today   Significant tests/ events  Spirometry 2012 >> FEV1 1.94 (52%) PFTs 05/2015 ratio 74, FEV1 72%, no BD response, mild restriction  CT sinus 08/2013 neg  Ct chest 01/2014 - left volume loss ? congenital   Review of Systems neg for any significant sore throat, dysphagia, itching, sneezing, nasal congestion or excess/ purulent secretions, fever, chills, sweats, unintended wt loss, pleuritic or exertional cp, hempoptysis, orthopnea pnd or change in chronic leg swelling. Also denies presyncope, palpitations, heartburn, abdominal pain, nausea, vomiting, diarrhea or change in bowel or urinary habits, dysuria,hematuria, rash, arthralgias, visual complaints, headache, numbness weakness or ataxia.     Objective:   Physical Exam   Gen. Pleasant, well-nourished, in no distress ENT - no thrush, no post nasal drip Neck: No JVD, no thyromegaly, no carotid bruits Lungs: no use of accessory muscles, no dullness to percussion, clear without rales or rhonchi  Cardiovascular: Rhythm regular, heart sounds  normal, no murmurs or gallops, no peripheral edema Musculoskeletal: No deformities, no cyanosis or clubbing          Assessment & Plan:

## 2016-11-29 NOTE — Assessment & Plan Note (Signed)
Refills on Symbicort-sample Can decrease to one puff twice daily.  We discussed plans for flare-call us and we can prescribe early antibiotic and prednisone

## 2016-11-29 NOTE — Assessment & Plan Note (Signed)
Counseling

## 2016-11-29 NOTE — Addendum Note (Signed)
Addended by: Valerie Salts on: 11/29/2016 12:12 PM   Modules accepted: Orders

## 2016-11-29 NOTE — Patient Instructions (Signed)
Refills on Symbicort-sample Can decrease to one puff twice daily.  We discussed plans for flare-call us and we can prescribe early antibiotic and prednisone

## 2016-12-01 DIAGNOSIS — E1021 Type 1 diabetes mellitus with diabetic nephropathy: Secondary | ICD-10-CM | POA: Diagnosis not present

## 2016-12-01 DIAGNOSIS — Z4681 Encounter for fitting and adjustment of insulin pump: Secondary | ICD-10-CM | POA: Diagnosis not present

## 2016-12-01 DIAGNOSIS — N08 Glomerular disorders in diseases classified elsewhere: Secondary | ICD-10-CM | POA: Diagnosis not present

## 2016-12-01 DIAGNOSIS — E1029 Type 1 diabetes mellitus with other diabetic kidney complication: Secondary | ICD-10-CM | POA: Diagnosis not present

## 2016-12-17 ENCOUNTER — Other Ambulatory Visit: Payer: Self-pay | Admitting: *Deleted

## 2016-12-17 NOTE — Patient Outreach (Signed)
Left message for patient requesting he call and arrange [facility] follow up as soon as possible. [name] RN,CCM,CDE [place] Management Coordinator [facility] Office Phone [phone] Office Fax [phone]

## 2016-12-24 MED FILL — SYMBICORT 160-4.5 MCG INH: 160-4.5 | 30 days supply | Qty: 10 | Fill #1

## 2016-12-24 MED FILL — CONTOUR NEXT STRIPS: 30 days supply | Qty: 200 | Fill #2

## 2016-12-24 MED FILL — traZODone HCL 150 MG TABS: 150 | 30 days supply | Qty: 30 | Fill #5

## 2016-12-24 MED FILL — CHANTIX 1 MG CONT MONTH BOX: 1 | 28 days supply | Qty: 56 | Fill #4

## 2016-12-24 MED FILL — SIMVASTATIN 40 MG TABLET: 40 | 30 days supply | Qty: 30 | Fill #2

## 2017-01-07 DIAGNOSIS — E1065 Type 1 diabetes mellitus with hyperglycemia: Secondary | ICD-10-CM | POA: Diagnosis not present

## 2017-01-11 ENCOUNTER — Other Ambulatory Visit: Payer: Self-pay | Admitting: *Deleted

## 2017-01-11 ENCOUNTER — Encounter: Payer: Self-pay | Admitting: *Deleted

## 2017-01-11 NOTE — Patient Outreach (Signed)
Second message (first message was left on 6/1) left at [place] home number requesting he schedule a [facility] follow up as he has not bee seen since 06/09/16. Will also mail a reminder letter to his home address. [name] RN,CCM,CDE [place] Management Coordinator [facility] and [employer] [phone] Office Fax [phone]

## 2017-01-11 NOTE — Patient Outreach (Signed)
Elihu has not responded to calls requesting her schedule a Link To wellness office visit so a letter will be mailed to hsi home requesting same or he will be terminated form the program with loss of pharmacy benefits. [name] RN,CCM,CDE [place] Management Coordinator [facility] and [employer] [phone] Office Fax [phone]

## 2017-01-14 MED FILL — HumaLOG 100 UNIT/ML SOLN: 100 | 90 days supply | Qty: 90 | Fill #1

## 2017-01-20 ENCOUNTER — Other Ambulatory Visit (HOSPITAL_COMMUNITY): Payer: Self-pay | Admitting: Acute Care

## 2017-01-20 ENCOUNTER — Encounter: Payer: Self-pay | Admitting: Acute Care

## 2017-01-20 ENCOUNTER — Telehealth: Payer: Self-pay | Admitting: Acute Care

## 2017-01-20 ENCOUNTER — Ambulatory Visit (INDEPENDENT_AMBULATORY_CARE_PROVIDER_SITE_OTHER): Payer: 59 | Admitting: Acute Care

## 2017-01-20 ENCOUNTER — Ambulatory Visit (INDEPENDENT_AMBULATORY_CARE_PROVIDER_SITE_OTHER)
Admission: RE | Admit: 2017-01-20 | Discharge: 2017-01-20 | Disposition: A | Discharge status: Home / Self Care | Payer: 59 | Source: Ambulatory Visit | Attending: Acute Care | Admitting: Acute Care

## 2017-01-20 ENCOUNTER — Ambulatory Visit: Payer: Self-pay | Admitting: *Deleted

## 2017-01-20 VITALS — BP 118/74 | HR 90 | Ht 69.0 in | Wt 176.2 lb

## 2017-01-20 DIAGNOSIS — J449 Chronic obstructive pulmonary disease, unspecified: Secondary | ICD-10-CM

## 2017-01-20 DIAGNOSIS — R059 Cough, unspecified: Secondary | ICD-10-CM

## 2017-01-20 DIAGNOSIS — R0602 Shortness of breath: Secondary | ICD-10-CM | POA: Diagnosis not present

## 2017-01-20 DIAGNOSIS — J690 Pneumonitis due to inhalation of food and vomit: Secondary | ICD-10-CM | POA: Diagnosis not present

## 2017-01-20 DIAGNOSIS — R1310 Dysphagia, unspecified: Secondary | ICD-10-CM

## 2017-01-20 DIAGNOSIS — R05 Cough: Secondary | ICD-10-CM

## 2017-01-20 DIAGNOSIS — R918 Other nonspecific abnormal finding of lung field: Secondary | ICD-10-CM

## 2017-01-20 MED ORDER — CEFDINIR 300 MG PO CAPS: 300.0000 mg | ORAL_CAPSULE | Freq: Two times a day (BID) | ORAL | 0 refills | Status: DC

## 2017-01-20 MED ORDER — PREDNISONE 10 MG PO TABS: ORAL_TABLET | ORAL | 0 refills | Status: DC

## 2017-01-20 MED ORDER — HYDROCODONE-HOMATROPINE 5-1.5 MG/5ML PO SYRP: 5.0000 mL | ORAL_SOLUTION | Freq: Four times a day (QID) | ORAL | 0 refills | Status: DC | PRN

## 2017-01-20 MED FILL — HYDROCODONE-HOMATROPINE SYR: 5-1.5 | 12 days supply | Qty: 240 | Fill #0

## 2017-01-20 MED FILL — predniSONE 10 MG TABS: 10 | 8 days supply | Qty: 20 | Fill #0

## 2017-01-20 MED FILL — CEFDINIR 300 MG CAPSULE: 300 | 7 days supply | Qty: 14 | Fill #0

## 2017-01-20 NOTE — Patient Instructions (Addendum)
It is nice to meet you today. We will do a CXR today. We will call you with results. Prednisone taper; 10 mg tablets: 4 tabs x 2 days, 3 tabs x 2 days, 2 tabs x 2 days 1 tab x 2 days then stop. Hydromet cough syrup 5 cc's up to every 6 hours as needed for cough. Omeprazole 20 mg daily at bedtime. Do not drive if sleepy. We will schedule you for a swallow study today. Follow up in 4 weeks to evaluate swallow study and re-asess cough. Please contact office for sooner follow up if symptoms do not improve or worsen or seek emergency care

## 2017-01-20 NOTE — Telephone Encounter (Signed)
I called pt and advised results from the x ray. Pt understood and I will call in the Rx to [facility]. I made him an appointment for 7/16 at 9am. Chest x ray ordered so pt can have this done prior. Nothing further is needed.

## 2017-01-20 NOTE — Assessment & Plan Note (Addendum)
Aspiration pneumonia secondary to choking on hamburger Patient states he has been having issues with this swallow Plan: We will do a CXR today./ Chest x-ray indicates mild right lower lobe infiltrate Prednisone taper; 10 mg tablets: 4 tabs x 2 days, 3 tabs x 2 days, 2 tabs x 2 days 1 tab x 2 days then stop. Omnicef 300 mg twice a [place] 7 days Take probiotic with antibiotic (culturelle, or align) Hydromet cough syrup 5 cc's up to every 6 hours as needed for cough. Omeprazole 20 mg daily at night for reflux. Do not drive if sleepy. We will schedule you for a swallow study today. Follow up in 1 weeks with chest x-ray prior to  re-asess cough/ infiltrate. Please contact office for sooner follow up if symptoms do not improve or worsen or seek emergency care

## 2017-01-20 NOTE — Assessment & Plan Note (Addendum)
Flare secondary to aspiration pneumonia Plan Continue Symbicort 2 puffs twice daily Continue rescue inhaler as needed Prednisone taper; 10 mg tablets: 4 tabs x 2 days, 3 tabs x 2 days, 2 tabs x 2 days 1 tab x 2 days then stop. Hydromet cough syrup 5 mL up to every 6 hours as needed for cough Do not drive if sleepy Follow-up in one week with chest x-ray prior Please contact office for sooner follow up if symptoms do not improve or worsen or seek emergency care

## 2017-01-20 NOTE — Progress Notes (Addendum)
History of Present Illness James [name] is a 61 y.o. male former smoker ( Quit 2016) with chronic asthmatic bronchitis. He is followed by Dr. Elsworth Soho.  01/25/2017 Acute OV: Pt. Presents with acute onset of cough, wheezing and shortness of breath that started yesterday after choking on lunch.( A [employer] thick burger). He states that he has had episodes of choking 2-3 times in the last 6 months.He is worried about his swallow.He is unable to take a deep breath without a very strong cough. The cough is non-productive for the most part . He states his mother has a history of difficulty with swallow.He denies any fever. He states his chest does hurt from coughing, and with deep breaths.He denies fever, orthopnea , or hemoptysis.  Test Results: CXR 01/20/2017: Mild right lower lobe infiltrate. Films reviewed personally by me and Dr. Melvyn Novas. Additional notation of slight right hemidiaphragm  elevation   PMhx >> HLD, Anxiety/depression, Spinal stenosis, CAD, DM type I with retinopathy, CKD  CBC Latest Ref Rng & Units 07/04/2016 06/24/2016 05/21/2016  WBC 4.0 - 10.5 K/uL 13.2(H) 7.7 5.3  Hemoglobin 13.0 - 17.0 g/dL 16.2 15.6 13.1  Hematocrit 39.0 - 52.0 % 45.2 42.8 37.9(L)  Platelets 150 - 400 K/uL 311 236 211    BMP Latest Ref Rng & Units 07/04/2016 06/24/2016 05/21/2016  Glucose 65 - 99 mg/dL 129(H) 218(H) 62(L)  BUN 6 - 20 mg/dL 12 13 12   Creatinine 0.61 - 1.24 mg/dL 0.94 0.94 0.74  Sodium 135 - 145 mmol/L 137 137 142  Potassium 3.5 - 5.1 mmol/L 4.0 4.4 3.8  Chloride 101 - 111 mmol/L 103 105 112(H)  CO2 22 - 32 mmol/L 24 24 24   Calcium 8.9 - 10.3 mg/dL 9.4 8.9 8.2(L)     PFT    Component Value Date/Time   FEV1PRE 2.52 05/22/2015 1001   FEV1POST 2.53 05/22/2015 1001   FVCPRE 3.41 05/22/2015 1001   FVCPOST 3.28 05/22/2015 1001   TLC 5.50 05/22/2015 1001   DLCOUNC 22.74 05/22/2015 1001   PREFEV1FVCRT 74 05/22/2015 1001   PSTFEV1FVCRT 77 05/22/2015 1001    Dg Chest 2 View  Result  Date: 01/20/2017 CLINICAL DATA:  Cough and shortness of Breath EXAM: CHEST  2 VIEW COMPARISON:  07/04/2016 FINDINGS: Cardiac shadow is within normal limits. The lungs are well aerated bilaterally. Right lower lobe infiltrate is noted. No bony abnormality is seen. Calcified granuloma is again seen in the mid lung on the left. IMPRESSION: Mild right lower lobe infiltrate. Electronically Signed   By: Inez Catalina M.D.   On: 01/20/2017 11:27     Past medical hx Past Medical History:  Diagnosis Date  . ANXIETY 04/03/2007  . Anxiety   . ASTHMA 09/06/2008  . Asthma   . ASTHMATIC BRONCHITIS, ACUTE 10/25/2008  . Bladder neck obstruction   . CARPAL TUNNEL SYNDROME, BILATERAL 07/31/2007   issues resolved, no surgery  . Cervical disc disease   . Chronic bronchitis ([place])    "get it about q yr" (02/12/2014)  . COPD (chronic obstructive pulmonary disease) ([place])   . CORONARY ARTERY DISEASE 04/03/2007  . DEPRESSION 04/03/2007  . Depression   . DIABETES MELLITUS, TYPE I 04/03/2007  . Diabetic retinopathy associated with diabetes mellitus due to underlying condition ([place]) 04/03/2007  . DM W/EYE MANIFESTATIONS, TYPE I, UNCONTROLLED 04/04/2007  . 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 ED (erectile dysfunction)   . History of kidney stones   . HYPERLIPIDEMIA 04/04/2007  . Pneumonia    "  several times and again today" (02/13/2104)  . Renal insufficiency   . 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 Spinal stenosis      Social History  Substance Use Topics  . Smoking status: Former Smoker    Packs/Kroon: 0.00    Types: Cigarettes    Quit date: 03/02/2015  . Smokeless tobacco: Never Used     Comment: resumed smoking in fall of 2017  . Alcohol use No    Tobacco Cessation: Former smoker quit in 2016  Past surgical hx, Family hx, Social hx all reviewed.  Current Outpatient Prescriptions on File Prior to Visit  Medication Sig  . albuterol  (PROVENTIL) (2.5 MG/3ML) 0.083% nebulizer solution Take 2.5 mg by nebulization every 6 (six) hours as needed for wheezing or shortness of breath.  . budesonide-formoterol (SYMBICORT) 160-4.5 MCG/ACT inhaler Inhale 2 puffs into the lungs 2 (two) times daily.  . cetirizine (ZYRTEC) 10 MG tablet Take 10 mg by mouth daily as needed for allergies.  [name] insulin lispro (HUMALOG) 100 UNIT/ML injection MedTronic pump  . naproxen sodium (ANAPROX) 220 MG tablet Take 440 mg by mouth 2 (two) times daily as needed (pain).  . simvastatin (ZOCOR) 40 MG tablet Take 40 mg by mouth at bedtime.   . traZODone (DESYREL) 150 MG tablet Take 150 mg by mouth at bedtime.  . varenicline (CHANTIX) 1 MG tablet Take 1 mg by mouth 2 (two) times daily.  . VENTOLIN HFA 108 (90 Base) MCG/ACT inhaler INHALE 2 PUFFS BY MOUTH INTO THE LUNGS EVERY 6 HOURS AS NEEDED FOR WHEEZING OR SHORTNESS OF BREATH.   No current facility-administered medications on file prior to visit.      Allergies  Allergen Reactions  . Augmentin [Amoxicillin-Pot Clavulanate] Nausea And Vomiting and Other (See Comments)    Has patient had a PCN reaction causing immediate rash, facial/tongue/throat swelling, SOB or lightheadedness with hypotension:  No  Has patient had a PCN reaction causing severe rash involving mucus membranes or skin necrosis:  No Has patient had a PCN reaction that required hospitalization No Has patient had a PCN reaction occurring within the last 10 years: No If all of the above answers are "NO", then may proceed with Cephalosporin use.  . Iohexol Hives and Other (See Comments)    Reaction:  Dizziness/sweating    . Ivp Dye [Iodinated Diagnostic Agents] Hives and Other (See Comments)    Dizziness/sweating ALSO  . Lexapro [Escitalopram Oxalate] Itching    Review Of Systems:  Constitutional:   No  weight loss, night sweats,  Fevers, chills, fatigue, or  lassitude.  HEENT:   No headaches,  Difficulty swallowing,  Tooth/dental  problems, or  Sore throat,                No sneezing, itching, ear ache, nasal congestion, post nasal drip,   CV:  No chest pain,  Orthopnea, PND, swelling in lower extremities, anasarca, dizziness, palpitations, syncope.   GI  No heartburn, indigestion, abdominal pain, nausea, vomiting, diarrhea, change in bowel habits, loss of appetite, bloody stools.   Resp: + shortness of breath with exertion less at rest.  No excess mucus, no productive cough,  + non-productive cough,  No coughing up of blood.  No change in color of mucus.  + wheezing.  No chest wall deformity  Skin: no rash or lesions.  GU: no dysuria, change in color of urine, no urgency or frequency.  No flank pain, no hematuria   MS:  No joint pain or swelling.  No decreased range of motion.  No back pain.  Psych:  No change in mood or affect. No depression or anxiety.  No memory loss.   Vital Signs BP 118/74 (BP Location: Left Arm, Cuff Size: Normal)   Pulse 90   Ht 5\' 9"  (1.753 m)   Wt 176 lb 3.2 oz (79.9 kg)   SpO2 94%   BMI 26.02 kg/m   Body mass index is 26.02 kg/m.  Physical Exam:  General- No distress,  A&Ox3, pleasant ENT: No sinus tenderness, TM clear, pale nasal mucosa, no oral exudate,no post nasal drip, no LAN, Cardiac: S1, S2, regular rate and rhythm, no murmur Chest: No wheeze/ rales/ dullness; no accessory muscle use, no nasal flaring, no sternal retractions, harsh cough with inspiration, coarse throughout and diminished in the right lower lobe. Abd.: Soft Non-tender, bowel sounds positive, nondistended Ext: No clubbing cyanosis, edema Neuro:  normal strength Skin: No rashes, warm and dry Psych: normal mood and behavior   Assessment/Plan  Chronic asthmatic bronchitis (HCC) Flare secondary to aspiration pneumonia Plan Continue Symbicort 2 puffs twice daily Continue rescue inhaler as needed Prednisone taper; 10 mg tablets: 4 tabs x 2 days, 3 tabs x 2 days, 2 tabs x 2 days 1 tab x 2 days then  stop. Hydromet cough syrup 5 mL up to every 6 hours as needed for cough Do not drive if sleepy Follow-up in one week with chest x-ray prior Please contact office for sooner follow up if symptoms do not improve or worsen or seek emergency care    Aspiration pneumonia ([place]) Aspiration pneumonia secondary to choking on hamburger Patient states he has been having issues with this swallow Plan: We will do a CXR today./ Chest x-ray indicates mild right lower lobe infiltrate Prednisone taper; 10 mg tablets: 4 tabs x 2 days, 3 tabs x 2 days, 2 tabs x 2 days 1 tab x 2 days then stop. Omnicef 300 mg twice a [place] 7 days Take probiotic with antibiotic (culturelle, or align) Hydromet cough syrup 5 cc's up to every 6 hours as needed for cough. Omeprazole 20 mg daily at night for reflux. Do not drive if sleepy. We will schedule you for a swallow study today. Follow up in 1 weeks with chest x-ray prior to  re-asess cough/ infiltrate. Please contact office for sooner follow up if symptoms do not improve or worsen or seek emergency care      Magdalen Spatz, NP 01/25/2017  1:04 PM

## 2017-01-20 NOTE — Telephone Encounter (Signed)
Please call patient and let him know that his chest x-ray showed a mild infiltrate in his right lower lobe. Let him know that I want him to start Omnicef 300 mg twice daily for 7 days. Tell him that I would like for him to either start a probiotic, or eat yogurt with probiotic twice daily while he is on the antibiotics. Please phone in prescription for Omnicef 300 mg twice daily for 7 days. We will need to see him back in the office in one week with chest x-ray prior. Please schedule this as we had originally scheduled him for a four-week follow-up.Please let him know to contact us sooner if he is not getting better on the antibiotic. Have him continue the prednisone taper and the cough syrup as we discussed at the office visit.  Thank you so much.

## 2017-01-24 ENCOUNTER — Ambulatory Visit: Payer: 59 | Admitting: Acute Care

## 2017-01-25 ENCOUNTER — Telehealth: Payer: Self-pay | Admitting: Acute Care

## 2017-01-25 DIAGNOSIS — R918 Other nonspecific abnormal finding of lung field: Secondary | ICD-10-CM

## 2017-01-25 NOTE — Telephone Encounter (Signed)
Spoke with pt, and attempted to make him aware of SG's recommendations. During our phone call pt had another call come in and pt asked that I call him back in 15 minutes.  wcb

## 2017-01-25 NOTE — Telephone Encounter (Signed)
Pt is aware of SG's recommendation and voiced his understanding. Pt has been scheduled with SG for 01/26/17 @ 9:00 with CXR prior. CXR has been ordered. Nothing further needed.

## 2017-01-25 NOTE — Telephone Encounter (Signed)
Please call patient and let him know it is most likely the antibiotic that is causing the nausea. Please have him start Zantac 150 mg daily. Have him start a bland diet. He needs to continue taking the antibiotic. Please set him up for a 9:00 appointment with me in the morning, have him come in 20 minutes early and have a chest x-ray done prior to the 9:00 appointment. This is a shared decision-making slot but it is not filled. Please explained to him this is the only opening I have before his appointment that is already scheduled on Monday, and I want to see him before then. Thanks so much.

## 2017-01-25 NOTE — Telephone Encounter (Signed)
Pt c/o increased nausea in the mornings which lasts about 1/2 [place] and spirts throughout the [place]oth[place].  Pt not sure if this is d/t the Cefdinir 300mg  -- pt has 2.5 days left Pt is taking medication with food and full glass of water.   Pt having a lot of wheezing, cough with "dirty" brown/tan mucus.   Pred prescribed 08/07/15 --  Pred 10 mg tabs - Take 4 tabs  daily with food x 4 days, then 3 tabs daily x 4 days, then 2 tabs daily x 4 days, then 1 tab daily x4 days then stop. #40  Patient states that this worked great at the higher dose more so than what he is doing right now.  Current dose was 4x2, 2x2, 1x2 stop  [facility]  Please advise [name] on recommendations. Thanks.

## 2017-01-26 ENCOUNTER — Other Ambulatory Visit: Payer: Self-pay | Admitting: Acute Care

## 2017-01-26 ENCOUNTER — Encounter: Payer: Self-pay | Admitting: Acute Care

## 2017-01-26 ENCOUNTER — Ambulatory Visit (INDEPENDENT_AMBULATORY_CARE_PROVIDER_SITE_OTHER)
Admission: RE | Admit: 2017-01-26 | Discharge: 2017-01-26 | Disposition: A | Discharge status: Home / Self Care | Payer: 59 | Source: Ambulatory Visit | Attending: Acute Care | Admitting: Acute Care

## 2017-01-26 ENCOUNTER — Ambulatory Visit (INDEPENDENT_AMBULATORY_CARE_PROVIDER_SITE_OTHER): Payer: 59 | Admitting: Acute Care

## 2017-01-26 VITALS — BP 120/70 | HR 71 | Ht 69.0 in | Wt 174.8 lb

## 2017-01-26 DIAGNOSIS — R05 Cough: Secondary | ICD-10-CM

## 2017-01-26 DIAGNOSIS — R918 Other nonspecific abnormal finding of lung field: Secondary | ICD-10-CM

## 2017-01-26 DIAGNOSIS — R059 Cough, unspecified: Secondary | ICD-10-CM

## 2017-01-26 LAB — NITRIC OXIDE: Nitric Oxide: 11

## 2017-01-26 MED ORDER — PANTOPRAZOLE SODIUM 20 MG PO TBEC: 20.0000 mg | DELAYED_RELEASE_TABLET | Freq: Every day | ORAL | 1 refills | Status: DC

## 2017-01-26 MED ORDER — PREDNISONE 10 MG PO TABS: ORAL_TABLET | ORAL | 0 refills | Status: DC

## 2017-01-26 MED ORDER — LEVALBUTEROL HCL 0.63 MG/3ML IN NEBU
0.6300 mg | INHALATION_SOLUTION | Freq: Once | RESPIRATORY_TRACT | Status: AC
  Administered 2017-01-26: 0.63 mg via RESPIRATORY_TRACT

## 2017-01-26 MED FILL — PANTOPRAZOLE SOD DR 20 MG T: 20 | 30 days supply | Qty: 30 | Fill #0

## 2017-01-26 MED FILL — predniSONE 10 MG TABS: 10 | 12 days supply | Qty: 30 | Fill #0

## 2017-01-26 NOTE — Assessment & Plan Note (Signed)
Continued flare Pneumonia clearing per CXR Plan: We will do a FENO in the office today to check for airway inflammation. CXR today shows clearing of pneumonia Xopenex treatment now for wheezing. Continue Symbicort 2 puffs twice daily Continue rescue inhaler as needed Prednisone taper; 10 mg tablets: 4 tabs x 3 days, 3 tabs x 3 days, 2 tabs x 3 days 1 tab x 3 days then stop. Hydromet cough syrup 5 mL up to every 6 hours as needed for cough Do not drive if sleepy Continue Omnicef until gone. Continue Mucinex 1200 mg once daily. Add probiotic ( Align or Culturelle) or eat yogurt with probiotic while on antibiotic Do your neb treatments twice daily while you are wheezing. We will give you a prescription for protonix 20 mg daily. Zantac 150 mg daily at bedtime Sugat free hard candies for throat soothing Avoid throat clearing, take sips of water instead. Swallow study as is already scheduled. Follow up appointment in 1 week. ( Cancel Monday 7/16) appointment Please contact office for sooner follow up if symptoms do not improve or worsen or seek emergency care

## 2017-01-26 NOTE — Progress Notes (Signed)
History of Present Illness James [name] is a 61 y.o. male former smoker ( Quit 2016) with chronic asthmatic bronchitis. He is followed by Dr. Elsworth Soho.  HPI: Pt. Presented 01/20/2017 with acute onset of cough, wheezing and SOB after aspirating 01/19/2017 on a [employer]. CXR revealed mild RLL infiltrate. He was treated with  a prednisone  taper and Omnicef 300 mg BID x 7 days.     01/26/2017 Acute Ov: Pt. Presents early for follow up. He called the office 01/25/2017 with complaints of nausea and wheezing with cough  after the higher dose prednisone was tapered down. He also had complaints of coughing up tan to brown mucus.He denied fever or chest pain. He was told to add zantac for nausea, and return to the office today for CXR and sooner follow up. Pt. Presents today with continued cough and wheezing.He denies fever or chest pain.He states he is coughing up tan secretions. He is not using his neb treatments ( He has these at home). He states he is compliant with his Symbicort. He denies any orthopnea or hemoptysis. No leg or calf pain.   Test Results: 01/26/2017: IMPRESSION: 1. Interval clearing of right lower lobe airspace disease. 2. No acute cardiopulmonary disease  FENO  01/26/2017 = 11  CBC Latest Ref Rng & Units 07/04/2016 06/24/2016 05/21/2016  WBC 4.0 - 10.5 K/uL 13.2(H) 7.7 5.3  Hemoglobin 13.0 - 17.0 g/dL 16.2 15.6 13.1  Hematocrit 39.0 - 52.0 % 45.2 42.8 37.9(L)  Platelets 150 - 400 K/uL 311 236 211    BMP Latest Ref Rng & Units 07/04/2016 06/24/2016 05/21/2016  Glucose 65 - 99 mg/dL 129(H) 218(H) 62(L)  BUN 6 - 20 mg/dL 12 13 12   Creatinine 0.61 - 1.24 mg/dL 0.94 0.94 0.74  Sodium 135 - 145 mmol/L 137 137 142  Potassium 3.5 - 5.1 mmol/L 4.0 4.4 3.8  Chloride 101 - 111 mmol/L 103 105 112(H)  CO2 22 - 32 mmol/L 24 24 24   Calcium 8.9 - 10.3 mg/dL 9.4 8.9 8.2(L)     PFT    Component Value Date/Time   FEV1PRE 2.52 05/22/2015 1001   FEV1POST 2.53 05/22/2015 1001   FVCPRE 3.41 05/22/2015 1001   FVCPOST 3.28 05/22/2015 1001   TLC 5.50 05/22/2015 1001   DLCOUNC 22.74 05/22/2015 1001   PREFEV1FVCRT 74 05/22/2015 1001   PSTFEV1FVCRT 77 05/22/2015 1001    Dg Chest 2 View  Result Date: 01/26/2017 CLINICAL DATA:  Cough and congestion. Low-grade fever for 1 week. Dizziness. EXAM: CHEST  2 VIEW COMPARISON:  Two-view chest x-ray 07/23/2016. FINDINGS: The heart size is normal. The lungs are clear. Previously noted airspace opacity at the right lung base has cleared. There is no residual airspace consolidation. No edema or effusion is present. The lungs are mildly hyperexpanded. Remote left-sided rib fractures are present. Visualized soft tissues and bony thorax are otherwise unremarkable. IMPRESSION: 1. Interval clearing of right lower lobe airspace disease. 2. No acute cardiopulmonary disease. Electronically Signed   By: San Morelle M.D.   On: 01/26/2017 09:06   Dg Chest 2 View  Result Date: 01/20/2017 CLINICAL DATA:  Cough and shortness of Breath EXAM: CHEST  2 VIEW COMPARISON:  07/04/2016 FINDINGS: Cardiac shadow is within normal limits. The lungs are well aerated bilaterally. Right lower lobe infiltrate is noted. No bony abnormality is seen. Calcified granuloma is again seen in the mid lung on the left. IMPRESSION: Mild right lower lobe infiltrate. Electronically Signed   By: Elta Guadeloupe  Lukens M.D.   On: 01/20/2017 11:27     Past medical hx Past Medical History:  Diagnosis Date  . ANXIETY 04/03/2007  . Anxiety   . ASTHMA 09/06/2008  . Asthma   . ASTHMATIC BRONCHITIS, ACUTE 10/25/2008  . Bladder neck obstruction   . CARPAL TUNNEL SYNDROME, BILATERAL 07/31/2007   issues resolved, no surgery  . Cervical disc disease   . Chronic bronchitis ([place])    "get it about q yr" (02/12/2014)  . COPD (chronic obstructive pulmonary disease) ([place])   . CORONARY ARTERY DISEASE 04/03/2007  . DEPRESSION 04/03/2007  . Depression   . DIABETES MELLITUS, TYPE I 04/03/2007  .  Diabetic retinopathy associated with diabetes mellitus due to underlying condition ([place]) 04/03/2007  . DM W/EYE MANIFESTATIONS, TYPE I, UNCONTROLLED 04/04/2007  . 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 ED (erectile dysfunction)   . History of kidney stones   . HYPERLIPIDEMIA 04/04/2007  . Pneumonia    "several times and again today" (02/13/2104)  . Renal insufficiency   . 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 Spinal stenosis      Social History  Substance Use Topics  . Smoking status: Former Smoker    Packs/Nolde: 0.00    Types: Cigarettes    Quit date: 03/02/2015  . Smokeless tobacco: Never Used     Comment: resumed smoking in fall of 2017  . Alcohol use No    Tobacco Cessation: Former smoker quit 2016  Past surgical hx, Family hx, Social hx all reviewed.  Current Outpatient Prescriptions on File Prior to Visit  Medication Sig  . albuterol (PROVENTIL) (2.5 MG/3ML) 0.083% nebulizer solution Take 2.5 mg by nebulization every 6 (six) hours as needed for wheezing or shortness of breath.  . budesonide-formoterol (SYMBICORT) 160-4.5 MCG/ACT inhaler Inhale 2 puffs into the lungs 2 (two) times daily.  . cefdinir (OMNICEF) 300 MG capsule Take 1 capsule (300 mg total) by mouth 2 (two) times daily.  [name] HYDROcodone-homatropine (HYCODAN) 5-1.5 MG/5ML syrup Take 5 mLs by mouth every 6 (six) hours as needed for cough.  . insulin lispro (HUMALOG) 100 UNIT/ML injection MedTronic pump  . naproxen sodium (ANAPROX) 220 MG tablet Take 440 mg by mouth 2 (two) times daily as needed (pain).  . predniSONE (DELTASONE) 10 MG tablet 40mg X2 days, 30mg  X2 days, 20mg  X2 days, 10mg X2 days, then stop.  . simvastatin (ZOCOR) 40 MG tablet Take 40 mg by mouth at bedtime.   . traZODone (DESYREL) 150 MG tablet Take 150 mg by mouth at bedtime.  . varenicline (CHANTIX) 1 MG tablet Take 1 mg by mouth 2 (two) times daily.  . VENTOLIN HFA 108 (90  Base) MCG/ACT inhaler INHALE 2 PUFFS BY MOUTH INTO THE LUNGS EVERY 6 HOURS AS NEEDED FOR WHEEZING OR SHORTNESS OF BREATH.   No current facility-administered medications on file prior to visit.      Allergies  Allergen Reactions  . Augmentin [Amoxicillin-Pot Clavulanate] Nausea And Vomiting and Other (See Comments)    Has patient had a PCN reaction causing immediate rash, facial/tongue/throat swelling, SOB or lightheadedness with hypotension:  No  Has patient had a PCN reaction causing severe rash involving mucus membranes or skin necrosis:  No Has patient had a PCN reaction that required hospitalization No Has patient had a PCN reaction occurring within the last 10 years: No If all of the above answers are "NO", then may proceed with Cephalosporin  use.  . Iohexol Hives and Other (See Comments)    Reaction:  Dizziness/sweating    . Ivp Dye [Iodinated Diagnostic Agents] Hives and Other (See Comments)    Dizziness/sweating ALSO  . Lexapro [Escitalopram Oxalate] Itching    Review Of Systems:  Constitutional:   No  weight loss, night sweats,  Fevers, chills, fatigue, or  lassitude.  HEENT:   No headaches,  Difficulty swallowing,  Tooth/dental problems, or  Sore throat,                No sneezing, itching, ear ache, nasal congestion, post nasal drip,   CV:  No chest pain,  Orthopnea, PND, swelling in lower extremities, anasarca, dizziness, palpitations, syncope.   GI  No heartburn, indigestion, abdominal pain, nausea, vomiting, diarrhea, change in bowel habits, loss of appetite, bloody stools.   Resp: No shortness of breath with exertion or at rest.  + excess mucus, + productive cough,  + non-productive cough,  No coughing up of blood.  + change in color of mucus.  + wheezing.  No chest wall deformity  Skin: no rash or lesions.  GU: no dysuria, change in color of urine, no urgency or frequency.  No flank pain, no hematuria   MS:  No joint pain or swelling.  No decreased range of  motion.  No back pain.  Psych:  No change in mood or affect. No depression or anxiety.  No memory loss.   Vital Signs BP 120/70 (BP Location: Left Arm, Cuff Size: Normal)   Pulse 71   Ht 5\' 9"  (1.753 m)   Wt 174 lb 12.8 oz (79.3 kg)   SpO2 96%   BMI 25.81 kg/m    Physical Exam:  General- No distress,  A&Ox3 ENT: No sinus tenderness, TM clear, pale nasal mucosa, no oral exudate,no post nasal drip, no LAN Cardiac: S1, S2, regular rate and rhythm, no murmur Chest: +  Wheeze throughout/ no  rales/ dullness; no accessory muscle use, no nasal flaring, no sternal retractions Abd.: Soft Non-tender,non-distended, BS+ Ext: No clubbing cyanosis, edema Neuro:  normal strength Skin: No rashes, warm and dry Psych: normal mood and behavior   Assessment/Plan  Chronic asthmatic bronchitis (HCC) Continued flare Pneumonia clearing per CXR Plan: We will do a FENO in the office today to check for airway inflammation. CXR today shows clearing of pneumonia Xopenex treatment now for wheezing. Continue Symbicort 2 puffs twice daily Continue rescue inhaler as needed Prednisone taper; 10 mg tablets: 4 tabs x 3 days, 3 tabs x 3 days, 2 tabs x 3 days 1 tab x 3 days then stop. Hydromet cough syrup 5 mL up to every 6 hours as needed for cough Do not drive if sleepy Continue Omnicef until gone. Continue Mucinex 1200 mg once daily. Add probiotic ( Align or Culturelle) or eat yogurt with probiotic while on antibiotic Do your neb treatments twice daily while you are wheezing. We will give you a prescription for protonix 20 mg daily. Zantac 150 mg daily at bedtime Sugat free hard candies for throat soothing Avoid throat clearing, take sips of water instead. Swallow study as is already scheduled. Follow up appointment in 1 week. ( Cancel Monday 7/16) appointment Please contact office for sooner follow up if symptoms do not improve or worsen or seek emergency care      Magdalen Spatz, NP 01/26/2017   9:33 AM

## 2017-01-26 NOTE — Patient Instructions (Addendum)
We will do a FENO in the office today to check for airway inflammation. CXR today shows clearing of pneumonia Xopenex treatment now for wheezing. Continue Symbicort 2 puffs twice daily Continue rescue inhaler as needed Prednisone taper; 10 mg tablets: 4 tabs x 3 days, 3 tabs x 3 days, 2 tabs x 3 days 1 tab x 3 days then stop. Hydromet cough syrup 5 mL up to every 6 hours as needed for cough Do not drive if sleepy Continue Omnicef until gone. Continue Mucinex 1200 mg once daily. Add probiotic ( Align or Culturelle) or eat yogurt with probiotic while on antibiotic Do your neb treatments twice daily while you are wheezing. We will give you a prescription for protonix 20 mg daily. Zantac 150 mg daily at bedtime Sugat free hard candies for throat soothing Avoid throat clearing, take sips of water instead. Swallow study as is already scheduled. Follow up appointment in 1 week. ( Cancel Monday 7/16) appointment Please contact office for sooner follow up if symptoms do not improve or worsen or seek emergency care

## 2017-01-26 NOTE — Telephone Encounter (Signed)
Per James [name] she is working on this, and will discuss this with SG. Forwarding the message to her to follow up on

## 2017-01-26 NOTE — Telephone Encounter (Signed)
Spoke with SG and she ok'd to have patient to be seen on 02/02/17 @ 9am - left message on pt vm to call us back if appointment time doesn't work for his schedule -pr

## 2017-01-26 NOTE — Telephone Encounter (Signed)
Patient seen today - per instructions on AVS patient to cancel appt on 01/31/17 and be seen in one week - SG has no availabilities in one week- Can pt keep appt on 01/31/17 -pr

## 2017-01-28 ENCOUNTER — Other Ambulatory Visit: Payer: Self-pay | Admitting: Acute Care

## 2017-01-28 ENCOUNTER — Ambulatory Visit (HOSPITAL_COMMUNITY)
Admission: RE | Admit: 2017-01-28 | Discharge: 2017-01-28 | Disposition: A | Discharge status: Home / Self Care | Payer: 59 | Source: Ambulatory Visit | Attending: Acute Care | Admitting: Acute Care

## 2017-01-28 ENCOUNTER — Other Ambulatory Visit: Payer: Self-pay

## 2017-01-28 DIAGNOSIS — T17908A Unspecified foreign body in respiratory tract, part unspecified causing other injury, initial encounter: Secondary | ICD-10-CM

## 2017-01-28 DIAGNOSIS — R1310 Dysphagia, unspecified: Secondary | ICD-10-CM

## 2017-01-28 DIAGNOSIS — R059 Cough, unspecified: Secondary | ICD-10-CM

## 2017-01-28 DIAGNOSIS — R05 Cough: Secondary | ICD-10-CM | POA: Insufficient documentation

## 2017-01-28 MED FILL — traZODone HCL 150 MG TABS: 150 | 90 days supply | Qty: 90 | Fill #0

## 2017-01-28 MED FILL — CONTOUR NEXT STRIPS: 30 days supply | Qty: 200 | Fill #3

## 2017-01-28 NOTE — Telephone Encounter (Signed)
Pt is currently sitting in the lobby and is wanting to get a refill of the cough med that was given by SG.  I spoke with SG about the refill and she stated that the med was given on 7/5 and that this med should last at least 12 days if he is taking correctly.  I went out to speak to the pt and he stated that he is feeling better but still coughing.  He is aware of SG recs about not being able to fill the cough meds as of today.  Sample of the delsym was given her SG and pt will try that.  He will contact us later on if he still needs the cough med.

## 2017-01-28 NOTE — Telephone Encounter (Signed)
Patient waiting in the lobby, patient came into clinic following up on the message that was left, pt states not feeling well, and needs something for the cough, pt requesting a written prescription.... Pt contact # [phone]...ert

## 2017-01-31 ENCOUNTER — Ambulatory Visit: Payer: 59 | Admitting: Acute Care

## 2017-02-01 ENCOUNTER — Other Ambulatory Visit: Payer: Self-pay

## 2017-02-01 DIAGNOSIS — R059 Cough, unspecified: Secondary | ICD-10-CM

## 2017-02-01 DIAGNOSIS — R05 Cough: Secondary | ICD-10-CM

## 2017-02-01 NOTE — Telephone Encounter (Signed)
Spoke with patient and reminded him about 7/18 at 9 with SG. Pt was also informed of xray needing to be done prior to appointment; he is going to complete xray at 830 and then come up for appointment. Pt verbalized understanding and did not have any questions. Nothing further is needed.

## 2017-02-02 ENCOUNTER — Encounter: Payer: Self-pay | Admitting: Acute Care

## 2017-02-02 ENCOUNTER — Telehealth: Payer: Self-pay | Admitting: Acute Care

## 2017-02-02 ENCOUNTER — Other Ambulatory Visit: Payer: Self-pay | Admitting: Acute Care

## 2017-02-02 ENCOUNTER — Ambulatory Visit (INDEPENDENT_AMBULATORY_CARE_PROVIDER_SITE_OTHER): Payer: 59 | Admitting: Acute Care

## 2017-02-02 ENCOUNTER — Ambulatory Visit (INDEPENDENT_AMBULATORY_CARE_PROVIDER_SITE_OTHER)
Admission: RE | Admit: 2017-02-02 | Discharge: 2017-02-02 | Disposition: A | Discharge status: Home / Self Care | Payer: 59 | Source: Ambulatory Visit | Attending: Acute Care | Admitting: Acute Care

## 2017-02-02 VITALS — BP 128/74 | HR 81 | Ht 69.0 in | Wt 175.0 lb

## 2017-02-02 DIAGNOSIS — J449 Chronic obstructive pulmonary disease, unspecified: Secondary | ICD-10-CM | POA: Diagnosis not present

## 2017-02-02 DIAGNOSIS — J690 Pneumonitis due to inhalation of food and vomit: Secondary | ICD-10-CM | POA: Diagnosis not present

## 2017-02-02 DIAGNOSIS — R059 Cough, unspecified: Secondary | ICD-10-CM

## 2017-02-02 DIAGNOSIS — J189 Pneumonia, unspecified organism: Secondary | ICD-10-CM | POA: Diagnosis not present

## 2017-02-02 DIAGNOSIS — R05 Cough: Secondary | ICD-10-CM

## 2017-02-02 MED ORDER — PREDNISONE 10 MG PO TABS: ORAL_TABLET | ORAL | 0 refills | Status: DC

## 2017-02-02 MED ORDER — LEVOFLOXACIN 750 MG PO TABS: 750.0000 mg | ORAL_TABLET | Freq: Every day | ORAL | 0 refills | Status: DC

## 2017-02-02 MED ORDER — PANTOPRAZOLE SODIUM 20 MG PO TBEC: 20.0000 mg | DELAYED_RELEASE_TABLET | Freq: Every day | ORAL | 1 refills | Status: DC

## 2017-02-02 NOTE — Assessment & Plan Note (Signed)
Slow to resolve flare after aspirating solid food. Plan: Prednisone taper; 10 mg tablets: 4 tabs x 2 days, 3 tabs x 2 days, 2 tabs x 2 days 1 tab x 2 days then resume your 10 mg daily dose. Follow up in 1 week with [name] of Dr. Melvyn Novas. Continue Mucinex 1200 mg in the morning Follow with a full glass of water. Aggressive pulmonary toilet  Do your neb treatments twice daily while you are wheezing, without fail. We will give you a prescription for protonix 20 mg daily. Continue Symbicort 2 puffs twice daily. Rinse mouth after use. Please contact office for sooner follow up if symptoms do not improve or worsen or seek emergency care

## 2017-02-02 NOTE — Assessment & Plan Note (Addendum)
Resolved per chest x-ray 02/02/2017 Plan Referral to SLP to help strengthen muscles and subsequently decrease residuals/aspiration risk and reinforce effective compensation strategies.  Education regarding the fact patient is at risk for aspiration, and to utilize strong cough, and strong swallow as was taught during swallow eval to help protect airway until he can be seen by speech therapy. Encouraged to seek care early if he has additional symptoms of pneumonia. Follow-up in one week. Please contact office for sooner follow up if symptoms do not improve or worsen or seek emergency care

## 2017-02-02 NOTE — Progress Notes (Signed)
History of Present Illness James [name] is a 61 y.o. male former smoker with  ( Quit 2016) with chronic asthmatic bronchitis. He is followed by Dr. Elsworth Soho.  HPI: Pt. Presented 01/20/2017 with acute onset of cough, wheezing and SOB after aspirating 01/19/2017 on a [employer]. CXR revealed mild RLL infiltrate. He was treated with  a prednisone  taper and Omnicef 300 mg BID x 7 days. He remains symptomatic and is here for follow up. Of note, the patient had surgery for spinal stenosis 06/2016. He states that at this point in time was when he noticed he started having issues with his swallow. Swallow study done 01/28/2017 indicated moderate aspiration.  7/18/2018Follow up OV 02/02/2017 Pt. Presents for follow up:  Pt. States he continues to feel poorly.He states he is weak with shortness of breath with chest pain, that he feels is from his cough. He has significantly decreased his activity level due to his fatigue.He says he feels like his lungs are " burned". He continues to have a cough that is productive for white secretions. He states he has been wheezing.Swallow evaluation was positive for moderate aspiration. He denies fever, or hemoptysis. He states he has been sleeping at a 30 degree angle x 6 years. He cannot tell me why. He denies leg or calf pain.No recent car travel or airline travel. He denies any swelling in his lower extremities.He is compliant with his Symbicort daily. He is using his rescue inhalers about twice daily.He states he is not using the Mucinex at all. He is using his neb treatments about once daily instead of twice.He did not use the Zantac as was suggested in the last OV.He is compliant with his Protonix. Today patient has a flat affect and appears to be depressed. He states he has "a lot going on" at present.   Test Results: CXR 02/02/2017 FINDINGS: Cardiac shadow is within normal limits. The lungs are well aerated bilaterally without focal infiltrate or sizable  effusion. No acute bony abnormality is noted. No active cardiopulmonary disease  01/28/2017>> Swallow Study: Moderate Aspiration Risk  Pt demonstrates impaired timing of oral transit and laryngeal closure allowing aspiration of thin liquids which was largely silent in nature. Epiglottic deflection is impaired resulting in inadequate airway protection and allowing vallecular space/epiglottic retention of barium.    Recommend follow up SLP to help strengthen muscles and subsequently decrease residuals/aspiration risk and reinforce effective compensation strategies Pt is likely chronically aspirating - and has managed until this most recent event when he aspirated solids.      CBC Latest Ref Rng & Units 07/04/2016 06/24/2016 05/21/2016  WBC 4.0 - 10.5 K/uL 13.2(H) 7.7 5.3  Hemoglobin 13.0 - 17.0 g/dL 16.2 15.6 13.1  Hematocrit 39.0 - 52.0 % 45.2 42.8 37.9(L)  Platelets 150 - 400 K/uL 311 236 211    BMP Latest Ref Rng & Units 07/04/2016 06/24/2016 05/21/2016  Glucose 65 - 99 mg/dL 129(H) 218(H) 62(L)  BUN 6 - 20 mg/dL 12 13 12   Creatinine 0.61 - 1.24 mg/dL 0.94 0.94 0.74  Sodium 135 - 145 mmol/L 137 137 142  Potassium 3.5 - 5.1 mmol/L 4.0 4.4 3.8  Chloride 101 - 111 mmol/L 103 105 112(H)  CO2 22 - 32 mmol/L 24 24 24   Calcium 8.9 - 10.3 mg/dL 9.4 8.9 8.2(L)     PFT    Component Value Date/Time   FEV1PRE 2.52 05/22/2015 1001   FEV1POST 2.53 05/22/2015 1001   FVCPRE 3.41 05/22/2015 1001  FVCPOST 3.28 05/22/2015 1001   TLC 5.50 05/22/2015 1001   DLCOUNC 22.74 05/22/2015 1001   PREFEV1FVCRT 74 05/22/2015 1001   PSTFEV1FVCRT 77 05/22/2015 1001    Dg Chest 2 View  Result Date: 02/02/2017 CLINICAL DATA:  Follow-up pneumonia EXAM: CHEST  2 VIEW COMPARISON:  01/26/2017 FINDINGS: Cardiac shadow is within normal limits. The lungs are well aerated bilaterally without focal infiltrate or sizable effusion. No acute bony abnormality is noted. IMPRESSION: No active cardiopulmonary disease.  Electronically Signed   By: Inez Catalina M.D.   On: 02/02/2017 10:22   Dg Chest 2 View  Result Date: 01/26/2017 CLINICAL DATA:  Cough and congestion. Low-grade fever for 1 week. Dizziness. EXAM: CHEST  2 VIEW COMPARISON:  Two-view chest x-ray 07/23/2016. FINDINGS: The heart size is normal. The lungs are clear. Previously noted airspace opacity at the right lung base has cleared. There is no residual airspace consolidation. No edema or effusion is present. The lungs are mildly hyperexpanded. Remote left-sided rib fractures are present. Visualized soft tissues and bony thorax are otherwise unremarkable. IMPRESSION: 1. Interval clearing of right lower lobe airspace disease. 2. No acute cardiopulmonary disease. Electronically Signed   By: San Morelle M.D.   On: 01/26/2017 09:06   Dg Chest 2 View  Result Date: 01/20/2017 CLINICAL DATA:  Cough and shortness of Breath EXAM: CHEST  2 VIEW COMPARISON:  07/04/2016 FINDINGS: Cardiac shadow is within normal limits. The lungs are well aerated bilaterally. Right lower lobe infiltrate is noted. No bony abnormality is seen. Calcified granuloma is again seen in the mid lung on the left. IMPRESSION: Mild right lower lobe infiltrate. Electronically Signed   By: Inez Catalina M.D.   On: 01/20/2017 11:27   Dg Swallowing Func-speech Pathology  Result Date: 01/28/2017 Objective Swallowing Evaluation: Type of Study: MBS-Modified Barium Swallow Study Patient Details Name: James [name] MRN: [id number] Date of Birth: 04/28/1956 Today's Date: 01/28/2017 Time: SLP Start Time (ACUTE ONLY): 1300-SLP Stop Time (ACUTE ONLY): 1349 SLP Time Calculation (min) (ACUTE ONLY): 49 min Past Medical History: Past Medical History: Diagnosis Date . ANXIETY 04/03/2007 . Anxiety  . ASTHMA 09/06/2008 . Asthma  . ASTHMATIC BRONCHITIS, ACUTE 10/25/2008 . Bladder neck obstruction  . CARPAL TUNNEL SYNDROME, BILATERAL 07/31/2007  issues resolved, no surgery . Cervical disc disease  . Chronic bronchitis ([place])    "get it about q yr" (02/12/2014) . COPD (chronic obstructive pulmonary disease) ([place])  . CORONARY ARTERY DISEASE 04/03/2007 . DEPRESSION 04/03/2007 . Depression  . DIABETES MELLITUS, TYPE I 04/03/2007 . Diabetic retinopathy associated with diabetes mellitus due to underlying condition ([place]) 04/03/2007 . DM W/EYE MANIFESTATIONS, TYPE I, UNCONTROLLED 04/04/2007 . DM W/RENAL MNFST, TYPE I, UNCONTROLLED 04/04/2007 . ED (erectile dysfunction)  . History of kidney stones  . HYPERLIPIDEMIA 04/04/2007 . Pneumonia   "several times and again today" (02/13/2104) . Renal insufficiency  . Seizures ([place])   "insulin seizure from time to time; none in the last couple years" (02/12/2014) . Spinal stenosis  Past Surgical History: Past Surgical History: Procedure Laterality Date . ANTERIOR CERVICAL DECOMP/DISCECTOMY FUSION  2000  "couple screws and a plate" . ANTERIOR CERVICAL DECOMP/DISCECTOMY FUSION N/A 06/24/2016  Procedure: ANTERIOR CERVICAL DECOMPRESSION/DISCECTOMY FUSION CERVICAL FOUR - CERVICAL FIVE, CERVICAL FIVE - CERVICAL SIX; REMOVAL TETHER CERVICAL PLATE;  Surgeon: Jovita Gamma, MD;  Location: [place];  Service: Neurosurgery;  Laterality: N/A;  ANTERIOR CERVICAL DECOMPRESSION/DISCECTOMY FUSION CERVICAL FOUR - CERVICAL FIVE, CERVICAL FIVE - CERVICAL SIX; REMOVAL TETHER CERVICAL PLATE . APPENDECTOMY   .  BACK SURGERY   . CARDIAC CATHETERIZATION  1990's . CATARACT EXTRACTION W/ INTRAOCULAR LENS  IMPLANT, BILATERAL Bilateral  . CYSTOSCOPY WITH RETROGRADE PYELOGRAM, URETEROSCOPY AND STENT PLACEMENT Bilateral 04/06/2013  Procedure: BILATERAL CYSTOSCOPY WITH RETROGRADE PYELOGRAMS, STENT PLACEMENTS AND LEFT URETEROSCOPY AND STONE REMOVAL;  Surgeon: Alexis Frock, MD;  Location: [facility];  Service: Urology;  Laterality: Bilateral; . CYSTOSCOPY WITH STENT PLACEMENT Right 04/12/2013  Procedure: CYSTOSCOPY WITH STENT PLACEMENT;  Surgeon: Alexis Frock, MD;  Location: [facility];  Service: Urology;  Laterality: Right; .  CYSTOSCOPY/RETROGRADE/URETEROSCOPY Bilateral 04/12/2013  Procedure: CYSTOSCOPY/RETROGRADE/URETEROSCOPY;  Surgeon: Alexis Frock, MD;  Location: [facility];  Service: Urology;  Laterality: Bilateral;  RIGHT RETROGRADE  . HOLMIUM LASER APPLICATION Left 7/74/1287  Procedure: HOLMIUM LASER APPLICATION;  Surgeon: Alexis Frock, MD;  Location: [facility];  Service: Urology;  Laterality: Left; . LYMPH NODE DISSECTION  ~ 1960  groin . stress cardiolite  09/06/2002 . TONSILLECTOMY   . VITRECTOMY Bilateral  HPI: 61 yo male referred for MBS due to pt recently aspirating hamburger and acquiring aspiration pna - on ABX.  Pt PMH + for prior smoker, DM, depression, CAD, GERD, cervical disc disease.  Pt is s/p ACDF 06/24/2016 C4-C5, C5-C6 with removal of C6-C7 hardware that was placed in 2000.  He reports dysphagia started after his surgery.  He admits to a little weight loss due to his dysphagia causing him to stop eating.  Pt denies requiring heimlich manuever and states he has more problems with foods than liquids.  He had not had pneumonia until recently.  CXR showed mild right lower lobe infiltrate.  He reports he coughs with nearly every meal - at times having to cough back up food.  .  Subjective: pt awake in chair Assessment / Plan / Recommendation CHL IP CLINICAL IMPRESSIONS 01/28/2017 Clinical Impression Mild oral (discoordination) and moderately severe pharyngeal dysphagia with sensorimotor impairments.  Pt demonstrates impaired timing of oral transit and laryngeal closure allowing aspiration of thin liquids which was largely silent in nature.  Small single bolus was not aspirated with 1/3 trials but pt chronically aspirates thin. In addition, epiglottic deflection is impaired resulting in inadequate airway protection and allowing vallecular space/epiglottic retention of barium.  Supraglottic swallow impaired swallow timing and did not prevent aspiration- worsened swallow. Cues to "swallow hard" were helpful to prevent retention  of pudding with barium tablet.  In addition, cues to "hock" and expectorate helpful to clear pudding residuals from vallecular region.  Reflexive dry swallows helpful to decrease pharyngeal residuals - which pt senses larger amount of residuals.  Did not test chin tuck posture due to pt's cervical spine issues.  Of note, pt reports dysphagia onset was sudden and after his ACDF surgery in December.  Recommend follow up SLP to help strengthen muscles and subsequently decrease residuals/aspiration risk and reinforce effective compensation strategies.  Educated pt at length regarding his test and reinforced effective strategies.   Pt seemed rather overwhelmed at end of testing and was mentioning concerns about weight loss, his mother's dysphagia, etc.   Pt is likely chronically aspirating - and has managed until this most recent event when he aspirated solids.  He aspirates liquids chronically based on this test result.   Advised him to multiple risk factors for aspiration pneumonias and importance of ambulatory status, strength of cough etc for airway protection/maintenance. Advised pt to consume thicker drinks (V8, tomato juice) during meals if helpful to decrease coughing/increase comfort. Otherwise recommend WATER consumption especially with meals due to pH neutral  quality and increased pna risk with other aspirates. Thanks for this referral.  SLP Visit Diagnosis Dysphagia, oropharyngeal phase (R13.12);Dysphagia, pharyngoesophageal phase (R13.14) Attention and concentration deficit following -- Frontal lobe and executive function deficit following -- Impact on safety and function Moderate aspiration risk   No flowsheet data found.  No flowsheet data found. CHL IP DIET RECOMMENDATION 01/28/2017 SLP Diet Recommendations Dysphagia 3 (Mech soft) solids;Thin liquid Liquid Administration via Cup;No straw Medication Administration Whole meds with puree Compensations Minimize environmental distractions;Slow rate;Small  sips/bites;Multiple dry swallows after each bite/sip;Effortful swallow;Other (Comment), "hock" and expectorate, small frequent meals, avoid particulate foods Postural Changes Remain semi-upright after after feeds/meals (Comment);Seated upright at 90 degrees   CHL IP OTHER RECOMMENDATIONS 01/28/2017 Recommended Consults -- Oral Care Recommendations Oral care BID Other Recommendations --   CHL IP FOLLOW UP RECOMMENDATIONS 01/28/2017 Follow up Recommendations Outpatient SLP   No flowsheet data found.     CHL IP ORAL PHASE 01/28/2017 Oral Phase WFL Oral - Pudding Teaspoon -- Oral - Pudding Cup -- Oral - Honey Teaspoon -- Oral - Honey Cup -- Oral - Nectar Teaspoon -- Oral - Nectar Cup Premature spillage Oral - Nectar Straw -- Oral - Thin Teaspoon -- Oral - Thin Cup Premature spillage Oral - Thin Straw Premature spillage Oral - Puree Premature spillage Oral - Mech Soft -- Oral - Regular WFL Oral - Multi-Consistency -- Oral - Pill Other (Comment) Oral Phase - Comment --  CHL IP PHARYNGEAL PHASE 01/28/2017 Pharyngeal Phase Impaired Pharyngeal- Pudding Teaspoon -- Pharyngeal -- Pharyngeal- Pudding Cup -- Pharyngeal -- Pharyngeal- Honey Teaspoon -- Pharyngeal -- Pharyngeal- Honey Cup -- Pharyngeal -- Pharyngeal- Nectar Teaspoon -- Pharyngeal -- Pharyngeal- Nectar Cup Delayed swallow initiation-vallecula;Reduced epiglottic inversion;Reduced laryngeal elevation;Reduced airway/laryngeal closure;Reduced tongue base retraction;Penetration/Aspiration during swallow;Pharyngeal residue - valleculae;Pharyngeal residue - pyriform Pharyngeal Material enters airway, remains ABOVE vocal cords and not ejected out Pharyngeal- Nectar Straw -- Pharyngeal -- Pharyngeal- Thin Teaspoon -- Pharyngeal -- Pharyngeal- Thin Cup Reduced epiglottic inversion;Reduced anterior laryngeal mobility;Reduced laryngeal elevation;Reduced airway/laryngeal closure;Reduced tongue base retraction;Penetration/Aspiration before swallow;Penetration/Aspiration during  swallow;Moderate aspiration;Pharyngeal residue - valleculae;Pharyngeal residue - pyriform Pharyngeal Material enters airway, passes BELOW cords without attempt by patient to eject out (silent aspiration) Pharyngeal- Thin Straw Reduced epiglottic inversion;Reduced anterior laryngeal mobility;Reduced laryngeal elevation;Reduced airway/laryngeal closure;Reduced tongue base retraction;Penetration/Aspiration during swallow;Moderate aspiration;Pharyngeal residue - valleculae;Pharyngeal residue - pyriform Pharyngeal Material enters airway, passes BELOW cords and not ejected out despite cough attempt by patient Pharyngeal- Puree Reduced epiglottic inversion;Reduced tongue base retraction;Pharyngeal residue - valleculae Pharyngeal -- Pharyngeal- Mechanical Soft -- Pharyngeal -- Pharyngeal- Regular Reduced epiglottic inversion;Reduced tongue base retraction;Pharyngeal residue - valleculae Pharyngeal -- Pharyngeal- Multi-consistency -- Pharyngeal -- Pharyngeal- Pill Reduced tongue base retraction;Pharyngeal residue - valleculae Pharyngeal -- Pharyngeal Comment Supraglottic swallow did not prevent aspiration and worsened it due to impairing timing of swallow, dry swallows helping/effective to decrease residuals, "hocking" effective to expectorate vallecular residuals, pharyngeal residuals of solids on epiglottis as poor deflec  CHL IP CERVICAL ESOPHAGEAL PHASE 01/28/2017 Cervical Esophageal Phase Impaired Pudding Teaspoon -- Pudding Cup -- Honey Teaspoon -- Honey Cup -- Nectar Teaspoon -- Nectar Cup -- Nectar Straw -- Thin Teaspoon -- Thin Cup -- Thin Straw -- Puree -- Mechanical Soft -- Regular -- Multi-consistency -- Pill -- Cervical Esophageal Comment -- CHL IP GO 01/28/2017 Functional Assessment Tool Used mbs Functional Limitations Swallowing Swallow Current Status (J6967) CJ Swallow Goal Status (E9381) CJ Swallow Discharge Status [phone]) CJ Luanna Salk, MS [facility] SLP [phone] Macario Golds 01/28/2017, 3:15 PM  Past medical hx Past Medical History:  Diagnosis Date  . ANXIETY 04/03/2007  . Anxiety   . ASTHMA 09/06/2008  . Asthma   . ASTHMATIC BRONCHITIS, ACUTE 10/25/2008  . Bladder neck obstruction   . CARPAL TUNNEL SYNDROME, BILATERAL 07/31/2007   issues resolved, no surgery  . Cervical disc disease   . Chronic bronchitis ([place])    "get it about q yr" (02/12/2014)  . COPD (chronic obstructive pulmonary disease) ([place])   . CORONARY ARTERY DISEASE 04/03/2007  . DEPRESSION 04/03/2007  . Depression   . DIABETES MELLITUS, TYPE I 04/03/2007  . Diabetic retinopathy associated with diabetes mellitus due to underlying condition ([place]) 04/03/2007  . DM W/EYE MANIFESTATIONS, TYPE I, UNCONTROLLED 04/04/2007  . 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 ED (erectile dysfunction)   . History of kidney stones   . HYPERLIPIDEMIA 04/04/2007  . Pneumonia    "several times and again today" (02/13/2104)  . Renal insufficiency   . Seizures ([place])    "insulin seizure from time to time; none in the last couple years" (02/12/2014)  . Spinal stenosis      Social History  Substance Use Topics  . Smoking status: Former Smoker    Packs/Vollmer: 0.00    Types: Cigarettes    Quit date: 03/02/2015  . Smokeless tobacco: Never Used     Comment: resumed smoking in fall of 2017  . Alcohol use No    Tobacco Cessation: Former smoker quit date 03/02/2015  Past surgical hx, Family hx, Social hx all reviewed.  Current Outpatient Prescriptions on File Prior to Visit  Medication Sig  . albuterol (PROVENTIL) (2.5 MG/3ML) 0.083% nebulizer solution Take 2.5 mg by nebulization every 6 (six) hours as needed for wheezing or shortness of breath.  . budesonide-formoterol (SYMBICORT) 160-4.5 MCG/ACT inhaler Inhale 2 puffs into the lungs 2 (two) times daily.  . insulin lispro (HUMALOG) 100 UNIT/ML injection MedTronic pump  . naproxen sodium (ANAPROX) 220 MG tablet Take 440 mg by mouth 2 (two) times daily as needed (pain).  .  pantoprazole (PROTONIX) 20 MG tablet Take 1 tablet (20 mg total) by mouth daily.  . predniSONE (DELTASONE) 10 MG tablet Take 4 tabs by mouth for 3 days, then 3 for 3 days, 2 for 3 days, 1 for 3 days and stop  . simvastatin (ZOCOR) 40 MG tablet Take 40 mg by mouth at bedtime.   . traZODone (DESYREL) 150 MG tablet Take 150 mg by mouth at bedtime.  . VENTOLIN HFA 108 (90 Base) MCG/ACT inhaler INHALE 2 PUFFS BY MOUTH INTO THE LUNGS EVERY 6 HOURS AS NEEDED FOR WHEEZING OR SHORTNESS OF BREATH.  . cefdinir (OMNICEF) 300 MG capsule Take 1 capsule (300 mg total) by mouth 2 (two) times daily. (Patient not taking: Reported on 02/02/2017)  . HYDROcodone-homatropine (HYCODAN) 5-1.5 MG/5ML syrup Take 5 mLs by mouth every 6 (six) hours as needed for cough. (Patient not taking: Reported on 02/02/2017)  . predniSONE (DELTASONE) 10 MG tablet 40mg X2 days, 30mg  X2 days, 20mg  X2 days, 10mg X2 days, then stop. (Patient not taking: Reported on 02/02/2017)  . varenicline (CHANTIX) 1 MG tablet Take 1 mg by mouth 2 (two) times daily.   No current facility-administered medications on file prior to visit.      Allergies  Allergen Reactions  . Augmentin [Amoxicillin-Pot Clavulanate] Nausea And Vomiting and Other (See Comments)    Has patient had a PCN reaction causing immediate rash, facial/tongue/throat swelling, SOB or lightheadedness with hypotension:  No  Has patient had a PCN reaction causing severe rash involving mucus membranes or skin necrosis:  No Has patient had a PCN reaction that required hospitalization No Has patient had a PCN reaction occurring within the last 10 years: No If all of the above answers are "NO", then may proceed with Cephalosporin use.  . Iohexol Hives and Other (See Comments)    Reaction:  Dizziness/sweating    . Ivp Dye [Iodinated Diagnostic Agents] Hives and Other (See Comments)    Dizziness/sweating ALSO  . Lexapro [Escitalopram Oxalate] Itching    Review Of Systems:  Constitutional:    No  weight loss, night sweats,  Fevers, chills,+fatigue, or  lassitude.  HEENT:   No headaches,  +Difficulty swallowing,  Tooth/dental problems, or  Sore throat,                No sneezing, itching, ear ache, nasal congestion, post nasal drip,   CV:  + chest pain, + Orthopnea ( x 6 years) ,No  PND, swelling in lower extremities, anasarca, dizziness, palpitations, syncope.   GI  No heartburn, indigestion, abdominal pain, nausea, vomiting, diarrhea, change in bowel habits, loss of appetite, bloody stools.   Resp: + shortness of breath with exertion less at rest.  + excess mucus, + productive cough,  No non-productive cough,  No coughing up of blood.  No change in color of mucus.  + wheezing.  No chest wall deformity  Skin: no rash or lesions, intact, warm and dry.  GU: no dysuria, change in color of urine, no urgency or frequency.  No flank pain, no hematuria   MS:  No joint pain or swelling.  No decreased range of motion.  No back pain.  Psych:  + change in mood or affect. + depression or anxiety.  No memory loss.   Vital Signs BP 128/74 (BP Location: Left Arm, Patient Position: Sitting, Cuff Size: Normal)   Pulse 81   Ht 5\' 9"  (1.753 m)   Wt 175 lb (79.4 kg)   SpO2 94%   BMI 25.84 kg/m    Physical Exam:  General- No distress,  A&Ox3, moving all extremities 4, flat affect ENT: No sinus tenderness, TM clear, pale nasal mucosa, no oral exudate,no post nasal drip, no LAN Cardiac: S1, S2, regular rate and rhythm, no murmur Chest: + wheeze/no  rales/ dullness; no accessory muscle use, no nasal flaring, no sternal retractions Abd.: Soft Non-tender, nondistended, bowel sounds positive Ext: No clubbing cyanosis, edema Neuro:  normal strength, alert and oriented 3, moving all extremities 4 Skin: No rashes, warm and dry, intact Psych: Flat affect, appears depressed, states he has a lot going on right now.   Assessment/Plan  Chronic asthmatic bronchitis (HCC) Slow to resolve  flare after aspirating solid food. Plan: Prednisone taper; 10 mg tablets: 4 tabs x 2 days, 3 tabs x 2 days, 2 tabs x 2 days 1 tab x 2 days then resume your 10 mg daily dose. Follow up in 1 week with [name] of Dr. Melvyn Novas. Continue Mucinex 1200 mg in the morning Follow with a full glass of water. Aggressive pulmonary toilet  Do your neb treatments twice daily while you are wheezing, without fail. We will give you a prescription for protonix 20 mg daily. Continue Symbicort 2 puffs twice daily. Rinse mouth after use. Please contact office for sooner follow up if symptoms do not improve or worsen or seek emergency care    Aspiration pneumonia [facility]) Resolved per chest x-ray 02/02/2017 Plan Referral  to SLP to help strengthen muscles and subsequently decrease residuals/aspiration risk and reinforce effective compensation strategies.  Education regarding the fact patient is at risk for aspiration, and to utilize strong cough, and strong swallow as was taught during swallow eval to help protect airway until he can be seen by speech therapy. Encouraged to seek care early if he has additional symptoms of pneumonia. Follow-up in one week. Please contact office for sooner follow up if symptoms do not improve or worsen or seek emergency care     Magdalen Spatz, NP 02/02/2017  11:51 AM

## 2017-02-02 NOTE — Patient Instructions (Addendum)
CXR today We will call you with results. Prednisone taper; 10 mg tablets: 4 tabs x 2 days, 3 tabs x 2 days, 2 tabs x 2 days 1 tab x 2 days then stop. Take probiotic while on antibiotic. We will have the care coordinators contact Speech Therapy to ensure start of therapy to improve swallow. Follow up in 1 week with [name] of Dr. Melvyn Novas. Continue Mucinex 1200 mg in the morning Follow with a full glass of water. Aggressive pulmonary toilet  Do your neb treatments twice daily while you are wheezing, without fail. We will give you a prescription for protonix 20 mg daily. Continue Symbicort 2 puffs twice daily. Rinse mouth after use. Please contact office for sooner follow up if symptoms do not improve or worsen or seek emergency care

## 2017-02-02 NOTE — Telephone Encounter (Signed)
Spoke with SG in the office who gave the verbal to use her 1130am Shared Decision appt slot on 7/25 for patient to follow up on.   LMOM TCB x1 to pt to make him aware of his appt

## 2017-02-03 ENCOUNTER — Inpatient Hospital Stay: Admission: RE | Admit: 2017-02-03 | Payer: 59 | Source: Ambulatory Visit

## 2017-02-03 MED FILL — predniSONE 10 MG TABS: 10 | 8 days supply | Qty: 20 | Fill #0

## 2017-02-03 NOTE — Telephone Encounter (Signed)
Called and spoke with pt and he stated that he will put this on his calendar, but he thinks that he already has an appt that [place].  He will call back if he needs to reschedule.

## 2017-02-04 MED FILL — SYMBICORT 160-4.5 MCG INH: 160-4.5 | 30 days supply | Qty: 10 | Fill #2

## 2017-02-04 MED FILL — SIMVASTATIN 40 MG TABLET: 40 | 90 days supply | Qty: 90 | Fill #0

## 2017-02-07 MED FILL — CHANTIX 1 MG CONT MONTH BOX: 1 | 28 days supply | Qty: 56 | Fill #5

## 2017-02-08 DIAGNOSIS — Z9889 Other specified postprocedural states: Secondary | ICD-10-CM | POA: Diagnosis not present

## 2017-02-09 ENCOUNTER — Encounter: Payer: Self-pay | Admitting: Acute Care

## 2017-02-09 ENCOUNTER — Ambulatory Visit: Payer: 59 | Admitting: Adult Health

## 2017-02-09 ENCOUNTER — Ambulatory Visit (INDEPENDENT_AMBULATORY_CARE_PROVIDER_SITE_OTHER): Payer: 59 | Admitting: Acute Care

## 2017-02-09 DIAGNOSIS — F17210 Nicotine dependence, cigarettes, uncomplicated: Secondary | ICD-10-CM | POA: Diagnosis not present

## 2017-02-09 DIAGNOSIS — J690 Pneumonitis due to inhalation of food and vomit: Secondary | ICD-10-CM

## 2017-02-09 DIAGNOSIS — J4489 Other specified chronic obstructive pulmonary disease: Secondary | ICD-10-CM

## 2017-02-09 DIAGNOSIS — J449 Chronic obstructive pulmonary disease, unspecified: Secondary | ICD-10-CM

## 2017-02-09 NOTE — Assessment & Plan Note (Addendum)
Resolved per chest x-ray 02/02/2017 Plan Continue use of chin tuck maneuver as you have been doing Referral for speech therapy for muscle strengthening and teaching/reinforcement of effective compensation strategies to prevent aspiration. Patient has not yet heard from speech therapy, we will have our PCPs reach out to them again. Please call the office if you aspirate and become symptomatic so that we can  initiate early treatment.

## 2017-02-09 NOTE — Progress Notes (Signed)
History of Present Illness James [name] is a 61 y.o. male former smoker quit 2016 with chronic asthmatic bronchitis. He is followed by Dr. Elsworth Soho.  HPI: Pt. Presented 01/20/2017 with acute onset of cough, wheezing and SOB after aspirating 01/19/2017 on a [employer]. CXR revealed mild RLL infiltrate. He was treated with aprednisone taper and Omnicef 300 mg BID x 7 days.  Of note, the patient had surgery for spinal stenosis 06/2016. He states that at this point in time was when he noticed he started having issues with his swallow. Swallow study done 01/28/2017 indicated moderate aspiration. At follow up on 02/02/2017 pt. continued to complain of  Dyspnea and cough.He had been compliant with his antibiotic and prednisone taper. He was using his neb treatments once daily instead of twice as prescribed, and he has not been using Zantac he was prescribed at the previous visit. Chest x-ray at that visit showed well aerated lungs bilaterally without focal infiltrate or effusion. He was treated again with a prednisone taper for continued flare of his chronic asthmatic bronchitis, and reminded to use his neb treatments twice daily while he was wheezing. He had remained compliant with his Symbicort. He was referred to SLP at this time for treatment to strengthen muscles and decrease residuals/aspiration risk, and to reinforce  effective compensation strategies while eating and drinking.  02/09/2017 Follow up of chronic asthmatic bronchitis: Patient presents today for follow-up. He states he was compliant with his prednisone taper.He states he is much better. He is using the chin tuck to decrease aspiration. Wheezing is gone, and cough is better. He still has some sputum that is slightly yellow tinged.He has no complaints.He has not had to use either his rescue inhaler or his nebs this last week. He denies fever, chest pain, orthopnea, or hemoptysis. He states that he still has not heard from speech therapy  regarding an appointment for muscle strengthening and teaching/reinforcement of effective compensation strategies to prevent aspiration.  Test Results:  CXR 02/02/2017 FINDINGS: Cardiac shadow is within normal limits. The lungs are well aerated bilaterally without focal infiltrate or sizable effusion. No acute bony abnormality is noted. No active cardiopulmonary disease  01/28/2017>> Swallow Study: Moderate Aspiration Risk  Pt demonstrates impaired timing of oral transit and laryngeal closure allowing aspiration of thin liquids which was largely silent in nature. Epiglottic deflection is impaired resulting in inadequate airway protection and allowing vallecular space/epiglottic retention of barium.   Recommend follow up SLP to help strengthen muscles and subsequently decrease residuals/aspiration risk and reinforce effective compensation strategies Pt is likely chronically aspirating - and has managed until this most recent event when he aspirated solids.   CBC Latest Ref Rng & Units 07/04/2016 06/24/2016 05/21/2016  WBC 4.0 - 10.5 K/uL 13.2(H) 7.7 5.3  Hemoglobin 13.0 - 17.0 g/dL 16.2 15.6 13.1  Hematocrit 39.0 - 52.0 % 45.2 42.8 37.9(L)  Platelets 150 - 400 K/uL 311 236 211    BMP Latest Ref Rng & Units 07/04/2016 06/24/2016 05/21/2016  Glucose 65 - 99 mg/dL 129(H) 218(H) 62(L)  BUN 6 - 20 mg/dL 12 13 12   Creatinine 0.61 - 1.24 mg/dL 0.94 0.94 0.74  Sodium 135 - 145 mmol/L 137 137 142  Potassium 3.5 - 5.1 mmol/L 4.0 4.4 3.8  Chloride 101 - 111 mmol/L 103 105 112(H)  CO2 22 - 32 mmol/L 24 24 24   Calcium 8.9 - 10.3 mg/dL 9.4 8.9 8.2(L)     PFT    Component Value Date/Time  FEV1PRE 2.52 05/22/2015 1001   FEV1POST 2.53 05/22/2015 1001   FVCPRE 3.41 05/22/2015 1001   FVCPOST 3.28 05/22/2015 1001   TLC 5.50 05/22/2015 1001   DLCOUNC 22.74 05/22/2015 1001   PREFEV1FVCRT 74 05/22/2015 1001   PSTFEV1FVCRT 77 05/22/2015 1001    Dg Chest 2 View  Result Date:  02/02/2017 CLINICAL DATA:  Follow-up pneumonia EXAM: CHEST  2 VIEW COMPARISON:  01/26/2017 FINDINGS: Cardiac shadow is within normal limits. The lungs are well aerated bilaterally without focal infiltrate or sizable effusion. No acute bony abnormality is noted. IMPRESSION: No active cardiopulmonary disease. Electronically Signed   By: Inez Catalina M.D.   On: 02/02/2017 10:22   Dg Chest 2 View  Result Date: 01/26/2017 CLINICAL DATA:  Cough and congestion. Low-grade fever for 1 week. Dizziness. EXAM: CHEST  2 VIEW COMPARISON:  Two-view chest x-ray 07/23/2016. FINDINGS: The heart size is normal. The lungs are clear. Previously noted airspace opacity at the right lung base has cleared. There is no residual airspace consolidation. No edema or effusion is present. The lungs are mildly hyperexpanded. Remote left-sided rib fractures are present. Visualized soft tissues and bony thorax are otherwise unremarkable. IMPRESSION: 1. Interval clearing of right lower lobe airspace disease. 2. No acute cardiopulmonary disease. Electronically Signed   By: San Morelle M.D.   On: 01/26/2017 09:06   Dg Chest 2 View  Result Date: 01/20/2017 CLINICAL DATA:  Cough and shortness of Breath EXAM: CHEST  2 VIEW COMPARISON:  07/04/2016 FINDINGS: Cardiac shadow is within normal limits. The lungs are well aerated bilaterally. Right lower lobe infiltrate is noted. No bony abnormality is seen. Calcified granuloma is again seen in the mid lung on the left. IMPRESSION: Mild right lower lobe infiltrate. Electronically Signed   By: Inez Catalina M.D.   On: 01/20/2017 11:27   Dg Swallowing Func-speech Pathology  Result Date: 01/28/2017 Objective Swallowing Evaluation: Type of Study: MBS-Modified Barium Swallow Study Patient Details Name: James [name] MRN: [id number] Date of Birth: 1956/02/02 Today's Date: 01/28/2017 Time: SLP Start Time (ACUTE ONLY): 1300-SLP Stop Time (ACUTE ONLY): 1349 SLP Time Calculation (min) (ACUTE ONLY): 49 min Past  Medical History: Past Medical History: Diagnosis Date . ANXIETY 04/03/2007 . Anxiety  . ASTHMA 09/06/2008 . Asthma  . ASTHMATIC BRONCHITIS, ACUTE 10/25/2008 . Bladder neck obstruction  . CARPAL TUNNEL SYNDROME, BILATERAL 07/31/2007  issues resolved, no surgery . Cervical disc disease  . Chronic bronchitis ([place])   "get it about q yr" (02/12/2014) . COPD (chronic obstructive pulmonary disease) ([place])  . CORONARY ARTERY DISEASE 04/03/2007 . DEPRESSION 04/03/2007 . Depression  . DIABETES MELLITUS, TYPE I 04/03/2007 . Diabetic retinopathy associated with diabetes mellitus due to underlying condition ([place]) 04/03/2007 . DM W/EYE MANIFESTATIONS, TYPE I, UNCONTROLLED 04/04/2007 . DM W/RENAL MNFST, TYPE I, UNCONTROLLED 04/04/2007 . ED (erectile dysfunction)  . History of kidney stones  . HYPERLIPIDEMIA 04/04/2007 . Pneumonia   "several times and again today" (02/13/2104) . Renal insufficiency  . Seizures ([place])   "insulin seizure from time to time; none in the last couple years" (02/12/2014) . Spinal stenosis  Past Surgical History: Past Surgical History: Procedure Laterality Date . ANTERIOR CERVICAL DECOMP/DISCECTOMY FUSION  2000  "couple screws and a plate" . ANTERIOR CERVICAL DECOMP/DISCECTOMY FUSION N/A 06/24/2016  Procedure: ANTERIOR CERVICAL DECOMPRESSION/DISCECTOMY FUSION CERVICAL FOUR - CERVICAL FIVE, CERVICAL FIVE - CERVICAL SIX; REMOVAL TETHER CERVICAL PLATE;  Surgeon: Jovita Gamma, MD;  Location: [place];  Service: Neurosurgery;  Laterality: N/A;  ANTERIOR CERVICAL DECOMPRESSION/DISCECTOMY FUSION CERVICAL  FOUR - CERVICAL FIVE, CERVICAL FIVE - CERVICAL SIX; REMOVAL TETHER CERVICAL PLATE . APPENDECTOMY   . BACK SURGERY   . CARDIAC CATHETERIZATION  1990's . CATARACT EXTRACTION W/ INTRAOCULAR LENS  IMPLANT, BILATERAL Bilateral  . CYSTOSCOPY WITH RETROGRADE PYELOGRAM, URETEROSCOPY AND STENT PLACEMENT Bilateral 04/06/2013  Procedure: BILATERAL CYSTOSCOPY WITH RETROGRADE PYELOGRAMS, STENT PLACEMENTS AND LEFT URETEROSCOPY AND STONE REMOVAL;   Surgeon: Alexis Frock, MD;  Location: [facility];  Service: Urology;  Laterality: Bilateral; . CYSTOSCOPY WITH STENT PLACEMENT Right 04/12/2013  Procedure: CYSTOSCOPY WITH STENT PLACEMENT;  Surgeon: Alexis Frock, MD;  Location: [facility];  Service: Urology;  Laterality: Right; . CYSTOSCOPY/RETROGRADE/URETEROSCOPY Bilateral 04/12/2013  Procedure: CYSTOSCOPY/RETROGRADE/URETEROSCOPY;  Surgeon: Alexis Frock, MD;  Location: [facility];  Service: Urology;  Laterality: Bilateral;  RIGHT RETROGRADE  . HOLMIUM LASER APPLICATION Left 0/98/1191  Procedure: HOLMIUM LASER APPLICATION;  Surgeon: Alexis Frock, MD;  Location: [facility];  Service: Urology;  Laterality: Left; . LYMPH NODE DISSECTION  ~ 1960  groin . stress cardiolite  09/06/2002 . TONSILLECTOMY   . VITRECTOMY Bilateral  HPI: 61 yo male referred for MBS due to pt recently aspirating hamburger and acquiring aspiration pna - on ABX.  Pt PMH + for prior smoker, DM, depression, CAD, GERD, cervical disc disease.  Pt is s/p ACDF 06/24/2016 C4-C5, C5-C6 with removal of C6-C7 hardware that was placed in 2000.  He reports dysphagia started after his surgery.  He admits to a little weight loss due to his dysphagia causing him to stop eating.  Pt denies requiring heimlich manuever and states he has more problems with foods than liquids.  He had not had pneumonia until recently.  CXR showed mild right lower lobe infiltrate.  He reports he coughs with nearly every meal - at times having to cough back up food.  .  Subjective: pt awake in chair Assessment / Plan / Recommendation CHL IP CLINICAL IMPRESSIONS 01/28/2017 Clinical Impression Mild oral (discoordination) and moderately severe pharyngeal dysphagia with sensorimotor impairments.  Pt demonstrates impaired timing of oral transit and laryngeal closure allowing aspiration of thin liquids which was largely silent in nature.  Small single bolus was not aspirated with 1/3 trials but pt chronically aspirates thin. In addition, epiglottic  deflection is impaired resulting in inadequate airway protection and allowing vallecular space/epiglottic retention of barium.  Supraglottic swallow impaired swallow timing and did not prevent aspiration- worsened swallow. Cues to "swallow hard" were helpful to prevent retention of pudding with barium tablet.  In addition, cues to "hock" and expectorate helpful to clear pudding residuals from vallecular region.  Reflexive dry swallows helpful to decrease pharyngeal residuals - which pt senses larger amount of residuals.  Did not test chin tuck posture due to pt's cervical spine issues.  Of note, pt reports dysphagia onset was sudden and after his ACDF surgery in December.  Recommend follow up SLP to help strengthen muscles and subsequently decrease residuals/aspiration risk and reinforce effective compensation strategies.  Educated pt at length regarding his test and reinforced effective strategies.   Pt seemed rather overwhelmed at end of testing and was mentioning concerns about weight loss, his mother's dysphagia, etc.   Pt is likely chronically aspirating - and has managed until this most recent event when he aspirated solids.  He aspirates liquids chronically based on this test result.   Advised him to multiple risk factors for aspiration pneumonias and importance of ambulatory status, strength of cough etc for airway protection/maintenance. Advised pt to consume thicker drinks (V8, tomato juice) during  meals if helpful to decrease coughing/increase comfort. Otherwise recommend WATER consumption especially with meals due to pH neutral quality and increased pna risk with other aspirates. Thanks for this referral.  SLP Visit Diagnosis Dysphagia, oropharyngeal phase (R13.12);Dysphagia, pharyngoesophageal phase (R13.14) Attention and concentration deficit following -- Frontal lobe and executive function deficit following -- Impact on safety and function Moderate aspiration risk   No flowsheet data found.  No  flowsheet data found. CHL IP DIET RECOMMENDATION 01/28/2017 SLP Diet Recommendations Dysphagia 3 (Mech soft) solids;Thin liquid Liquid Administration via Cup;No straw Medication Administration Whole meds with puree Compensations Minimize environmental distractions;Slow rate;Small sips/bites;Multiple dry swallows after each bite/sip;Effortful swallow;Other (Comment), "hock" and expectorate, small frequent meals, avoid particulate foods Postural Changes Remain semi-upright after after feeds/meals (Comment);Seated upright at 90 degrees   CHL IP OTHER RECOMMENDATIONS 01/28/2017 Recommended Consults -- Oral Care Recommendations Oral care BID Other Recommendations --   CHL IP FOLLOW UP RECOMMENDATIONS 01/28/2017 Follow up Recommendations Outpatient SLP   No flowsheet data found.     CHL IP ORAL PHASE 01/28/2017 Oral Phase WFL Oral - Pudding Teaspoon -- Oral - Pudding Cup -- Oral - Honey Teaspoon -- Oral - Honey Cup -- Oral - Nectar Teaspoon -- Oral - Nectar Cup Premature spillage Oral - Nectar Straw -- Oral - Thin Teaspoon -- Oral - Thin Cup Premature spillage Oral - Thin Straw Premature spillage Oral - Puree Premature spillage Oral - Mech Soft -- Oral - Regular WFL Oral - Multi-Consistency -- Oral - Pill Other (Comment) Oral Phase - Comment --  CHL IP PHARYNGEAL PHASE 01/28/2017 Pharyngeal Phase Impaired Pharyngeal- Pudding Teaspoon -- Pharyngeal -- Pharyngeal- Pudding Cup -- Pharyngeal -- Pharyngeal- Honey Teaspoon -- Pharyngeal -- Pharyngeal- Honey Cup -- Pharyngeal -- Pharyngeal- Nectar Teaspoon -- Pharyngeal -- Pharyngeal- Nectar Cup Delayed swallow initiation-vallecula;Reduced epiglottic inversion;Reduced laryngeal elevation;Reduced airway/laryngeal closure;Reduced tongue base retraction;Penetration/Aspiration during swallow;Pharyngeal residue - valleculae;Pharyngeal residue - pyriform Pharyngeal Material enters airway, remains ABOVE vocal cords and not ejected out Pharyngeal- Nectar Straw -- Pharyngeal -- Pharyngeal-  Thin Teaspoon -- Pharyngeal -- Pharyngeal- Thin Cup Reduced epiglottic inversion;Reduced anterior laryngeal mobility;Reduced laryngeal elevation;Reduced airway/laryngeal closure;Reduced tongue base retraction;Penetration/Aspiration before swallow;Penetration/Aspiration during swallow;Moderate aspiration;Pharyngeal residue - valleculae;Pharyngeal residue - pyriform Pharyngeal Material enters airway, passes BELOW cords without attempt by patient to eject out (silent aspiration) Pharyngeal- Thin Straw Reduced epiglottic inversion;Reduced anterior laryngeal mobility;Reduced laryngeal elevation;Reduced airway/laryngeal closure;Reduced tongue base retraction;Penetration/Aspiration during swallow;Moderate aspiration;Pharyngeal residue - valleculae;Pharyngeal residue - pyriform Pharyngeal Material enters airway, passes BELOW cords and not ejected out despite cough attempt by patient Pharyngeal- Puree Reduced epiglottic inversion;Reduced tongue base retraction;Pharyngeal residue - valleculae Pharyngeal -- Pharyngeal- Mechanical Soft -- Pharyngeal -- Pharyngeal- Regular Reduced epiglottic inversion;Reduced tongue base retraction;Pharyngeal residue - valleculae Pharyngeal -- Pharyngeal- Multi-consistency -- Pharyngeal -- Pharyngeal- Pill Reduced tongue base retraction;Pharyngeal residue - valleculae Pharyngeal -- Pharyngeal Comment Supraglottic swallow did not prevent aspiration and worsened it due to impairing timing of swallow, dry swallows helping/effective to decrease residuals, "hocking" effective to expectorate vallecular residuals, pharyngeal residuals of solids on epiglottis as poor deflec  CHL IP CERVICAL ESOPHAGEAL PHASE 01/28/2017 Cervical Esophageal Phase Impaired Pudding Teaspoon -- Pudding Cup -- Honey Teaspoon -- Honey Cup -- Nectar Teaspoon -- Nectar Cup -- Nectar Straw -- Thin Teaspoon -- Thin Cup -- Thin Straw -- Puree -- Mechanical Soft -- Regular -- Multi-consistency -- Pill -- Cervical Esophageal Comment --  CHL IP GO 01/28/2017 Functional Assessment Tool Used mbs Functional Limitations Swallowing Swallow Current Status (E2800) CJ Swallow Goal Status (L4917)  [place] Discharge Status [phone]) CJ Luanna Salk, MS [facility] SLP [phone] Macario Golds 01/28/2017, 3:15 PM                Past medical hx Past Medical History:  Diagnosis Date  . ANXIETY 04/03/2007  . Anxiety   . ASTHMA 09/06/2008  . Asthma   . ASTHMATIC BRONCHITIS, ACUTE 10/25/2008  . Bladder neck obstruction   . CARPAL TUNNEL SYNDROME, BILATERAL 07/31/2007   issues resolved, no surgery  . Cervical disc disease   . Chronic bronchitis ([place])    "get it about q yr" (02/12/2014)  . COPD (chronic obstructive pulmonary disease) ([place])   . CORONARY ARTERY DISEASE 04/03/2007  . DEPRESSION 04/03/2007  . Depression   . DIABETES MELLITUS, TYPE I 04/03/2007  . Diabetic retinopathy associated with diabetes mellitus due to underlying condition ([place]) 04/03/2007  . DM W/EYE MANIFESTATIONS, TYPE I, UNCONTROLLED 04/04/2007  . 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 ED (erectile dysfunction)   . History of kidney stones   . HYPERLIPIDEMIA 04/04/2007  . Pneumonia    "several times and again today" (02/13/2104)  . Renal insufficiency   . Seizures ([place])    "insulin seizure from time to time; none in the last couple years" (02/12/2014)  . Spinal stenosis      Social History  Substance Use Topics  . Smoking status: Current Every [place] Smoker    Packs/Gray: 0.00    Types: Cigarettes    Last attempt to quit: 03/02/2015  . Smokeless tobacco: Never Used     Comment: resumed smoking in fall of 2017  . Alcohol use No    Tobacco Cessation: Ready to quit: Yes Counseling given: Yes Patient is a current every [place] smoker  I have spent 4 minutes counseling patient on smoking cessation this visit. Patient was provided with a be stronger than your excuses card which provides resources in the community to assist in smoking cessation.  Past surgical  hx, Family hx, Social hx all reviewed.  Current Outpatient Prescriptions on File Prior to Visit  Medication Sig  . albuterol (PROVENTIL) (2.5 MG/3ML) 0.083% nebulizer solution Take 2.5 mg by nebulization every 6 (six) hours as needed for wheezing or shortness of breath.  . budesonide-formoterol (SYMBICORT) 160-4.5 MCG/ACT inhaler Inhale 2 puffs into the lungs 2 (two) times daily.  [name] HYDROcodone-homatropine (HYCODAN) 5-1.5 MG/5ML syrup Take 5 mLs by mouth every 6 (six) hours as needed for cough. (Patient not taking: Reported on 02/02/2017)  . insulin lispro (HUMALOG) 100 UNIT/ML injection MedTronic pump  . naproxen sodium (ANAPROX) 220 MG tablet Take 440 mg by mouth 2 (two) times daily as needed (pain).  . pantoprazole (PROTONIX) 20 MG tablet Take 1 tablet (20 mg total) by mouth daily.  . simvastatin (ZOCOR) 40 MG tablet Take 40 mg by mouth at bedtime.   . traZODone (DESYREL) 150 MG tablet Take 150 mg by mouth at bedtime.  . varenicline (CHANTIX) 1 MG tablet Take 1 mg by mouth 2 (two) times daily.  . VENTOLIN HFA 108 (90 Base) MCG/ACT inhaler INHALE 2 PUFFS BY MOUTH INTO THE LUNGS EVERY 6 HOURS AS NEEDED FOR WHEEZING OR SHORTNESS OF BREATH.   No current facility-administered medications on file prior to visit.      Allergies  Allergen Reactions  . Augmentin [Amoxicillin-Pot Clavulanate] Nausea And Vomiting and Other (See Comments)    Has patient had a PCN reaction causing immediate rash, facial/tongue/throat swelling, SOB or lightheadedness with hypotension:  No  Has patient had a PCN reaction causing severe rash involving mucus membranes or skin necrosis:  No Has patient had a PCN reaction that required hospitalization No Has patient had a PCN reaction occurring within the last 10 years: No If all of the above answers are "NO", then may proceed with Cephalosporin use.  . Iohexol Hives and Other (See Comments)    Reaction:  Dizziness/sweating    . Ivp Dye [Iodinated Diagnostic Agents]  Hives and Other (See Comments)    Dizziness/sweating ALSO  . Lexapro [Escitalopram Oxalate] Itching    Review Of Systems:  Constitutional:   No  weight loss, night sweats,  Fevers, chills, fatigue, or  lassitude.  HEENT:   No headaches,  Difficulty swallowing,  Tooth/dental problems, or  Sore throat,                No sneezing, itching, ear ache, nasal congestion, +post nasal drip,   CV:  No chest pain,  Orthopnea, PND, swelling in lower extremities, anasarca, dizziness, palpitations, syncope.   GI  No heartburn, indigestion, abdominal pain, nausea, vomiting, diarrhea, change in bowel habits, loss of appetite, bloody stools.   Resp: No shortness of breath with exertion or at rest.  + excess mucus, no productive cough,  No non-productive cough,  No coughing up of blood.  No change in color of mucus.  No wheezing.  No chest wall deformity  Skin: no rash or lesions.  GU: no dysuria, change in color of urine, no urgency or frequency.  No flank pain, no hematuria   MS:  No joint pain or swelling.  No decreased range of motion.  No back pain.  Psych:  No change in mood or affect. No depression or anxiety.  No memory loss.   Vital Signs BP 132/62 (BP Location: Left Arm, Cuff Size: Normal)   Pulse 80   Ht 5\' 9"  (1.753 m)   Wt 176 lb (79.8 kg)   SpO2 97%   BMI 25.99 kg/m    Physical Exam:  General- No distress,  A&Ox3, pleasant ENT: No sinus tenderness, TM clear, pale nasal mucosa, no oral exudate,no post nasal drip, no LAN Cardiac: S1, S2, regular rate and rhythm, no murmur Chest: No wheeze/ rales/ dullness; no accessory muscle use, no nasal flaring, no sternal retractions Abd.: Soft Non-tender, nondistended, bowel sounds positive Ext: No clubbing cyanosis, edema Neuro:  normal strength Skin: No rashes, warm and dry Psych: normal mood and behavior   Assessment/Plan  Chronic asthmatic bronchitis (HCC) Resolved flare that was secondary to aspiration Plan We will reach out  to speech and see what is taking so long to get you in to see the therapists. Continue your Symbicort daily as you have been doing Rinse mouth after use. Rescue inhaler and nebs as needed Cancel appointment 02/23/2017 as you are better. Please work on quitting smoking as this is the single most powerful action you can take to decrease your risk of lung disease, and lung cancer.( Given Be Stronger than your excuses card) Follow up with PCP re:  Wellbutrin treatment ( Already taking Chantix) Follow up in 3 months with Dr. Elsworth Soho. Please contact office for sooner follow up if symptoms do not improve or worsen or seek emergency care     Aspiration pneumonia [facility]) Resolved per chest x-ray 02/02/2017 Plan Continue use of chin tuck maneuver as you have been doing Referral for speech therapy for muscle strengthening and teaching/reinforcement of effective compensation strategies to prevent aspiration. Patient has not  yet heard from speech therapy, we will have our PCPs reach out to them again. Please call the office if you aspirate and become symptomatic so that we can  initiate early treatment.    Magdalen Spatz, NP 02/09/2017  11:53 AM

## 2017-02-09 NOTE — Assessment & Plan Note (Signed)
Resolved flare that was secondary to aspiration Plan We will reach out to speech and see what is taking so long to get you in to see the therapists. Continue your Symbicort daily as you have been doing Rinse mouth after use. Rescue inhaler and nebs as needed Cancel appointment 02/23/2017 as you are better. Please work on quitting smoking as this is the single most powerful action you can take to decrease your risk of lung disease, and lung cancer.( Given Be Stronger than your excuses card) Follow up with PCP re:  Wellbutrin treatment ( Already taking Chantix) Follow up in 3 months with Dr. Elsworth Soho. Please contact office for sooner follow up if symptoms do not improve or worsen or seek emergency care

## 2017-02-09 NOTE — Patient Instructions (Addendum)
It is good to see you today. I am glad you are better. We will reach out to speech and see what is taking so long to get you in to see the therapists. Continue your Symbicort daily as you have been doing Rinse mouth after use. Rescue inhaler and nebs as needed Cancel appointment 02/23/2017 as you are better. Please work on quitting smoking as this is the single most powerful action you can take to decrease your risk of lung disease, and lung cancer.( Given Be Stronger than your excuses card) Follow up with PCP re:  Wellbutrin treatment ( Already taking Chantix) Follow up in 3 months with Dr. Elsworth Soho. Please contact office for sooner follow up if symptoms do not improve or worsen or seek emergency care

## 2017-02-14 ENCOUNTER — Ambulatory Visit: Payer: 59

## 2017-02-15 ENCOUNTER — Other Ambulatory Visit: Payer: Self-pay | Admitting: *Deleted

## 2017-02-15 NOTE — Patient Outreach (Signed)
Returned call to [place] after his wife Levander Campion left a message asking to rescheduled his appointment on 02/21/17 to an earlier time or [place] due to [place] new job schedule. Left message on home answering machine advising of other dates and times this RNCM is available in early morning. Await return call from [place] or [place]. [name] RN,CCM,CDE [place] Management Coordinator [facility] and [employer] [phone] Office Fax [phone]

## 2017-02-21 ENCOUNTER — Ambulatory Visit: Payer: Self-pay | Admitting: *Deleted

## 2017-02-22 ENCOUNTER — Other Ambulatory Visit: Payer: Self-pay | Admitting: *Deleted

## 2017-02-22 ENCOUNTER — Encounter: Payer: Self-pay | Admitting: *Deleted

## 2017-02-22 NOTE — Patient Outreach (Signed)
[facility]) Care Management   02/22/17  James [name] [birth year] [id number]  James [name] is an 61 y.o. male who presents to the [place] [facility] office for routine Link To Wellness follow up for self management assistance with Type I DM and hyperlipidemia.   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  He reports he is working 6 days per week for 12 hours each [place].  He reports hypoglycemia occurs most often during work hours and he treats with glucose tablets. He continues to wear the Medtronic insulin pump and Guardian sensor. He says he sees [name] and [name] Pharm D every 3-4 months for pump adjustments and diabetes management assistance. [name]  He says he was diagnoses with aspiration pneumonia in July and was told he is chronically aspirating so he is to start outpatient speech therapy. In the meantime, he uses the chin tuck maneuver to assist with swallowing. He attributes the swallowing difficulty to the cervical neck surgery he had in December. He says he is starting to get numbness and tingling in both hands and was advised to see hand surgeon as he likely needs carpal tunnel surgery. He says he stopped smoking with the help of Chantix when he was ill with pneumonia in July.  Objective:   Review of Systems  Constitutional: Negative.   Respiratory: Positive for cough.   Cough is loose but nonproductive.   Physical Exam  Constitutional: He is oriented to person, place, and time. He appears well-developed and well-nourished.  Neurological: He is alert and oriented to person, place, and time.  Skin: Skin is warm and dry.  Psychiatric: He has a normal mood and affect. His behavior is normal. Judgment and thought content normal.   Filed Weights   02/22/17 0804  Weight: 177 lb 3.2 oz (80.4 kg)   Vitals:    02/22/17 0804  BP: 112/70  Fasting CBG= 100- and falling  per Medtronic Guardian sensor- [place] treated with 4 glucose tablets.  Current Medications:   Current Outpatient Prescriptions  Medication Sig Dispense Refill  . albuterol (PROVENTIL) (2.5 MG/3ML) 0.083% nebulizer solution Take 2.5 mg by nebulization every 6 (six) hours as needed for wheezing or shortness of breath.    . budesonide-formoterol (SYMBICORT) 160-4.5 MCG/ACT inhaler Inhale 2 puffs into the lungs 2 (two) times daily. 1 Inhaler 6  . insulin lispro (HUMALOG) 100 UNIT/ML injection MedTronic pump    . naproxen sodium (ANAPROX) 220 MG tablet Take 440 mg by mouth 2 (two) times daily as needed (pain).    . pantoprazole (PROTONIX) 20 MG tablet Take 1 tablet (20 mg total) by mouth daily. 30 tablet 1  . simvastatin (ZOCOR) 40 MG tablet Take 40 mg by mouth at bedtime.   5  . traZODone (DESYREL) 150 MG tablet Take 150 mg by mouth at bedtime.  5  . varenicline (CHANTIX) 1 MG tablet Take 1 mg by mouth 2 (two) times daily.    . VENTOLIN HFA 108 (90 Base) MCG/ACT inhaler INHALE 2 PUFFS BY MOUTH INTO THE LUNGS EVERY 6 HOURS AS NEEDED FOR WHEEZING OR SHORTNESS OF BREATH. 18 g 5  . HYDROcodone-homatropine (HYCODAN) 5-1.5 MG/5ML syrup Take 5 mLs by mouth every 6 (six) hours as needed for cough. (Patient not taking: Reported on 02/02/2017) 240 mL 0   No current facility-administered  medications for this visit.     Functional Status:   In your present state of health, do you have any difficulty performing the following activities: 02/22/2017 06/09/2016  Hearing? Y [place]? N N  Difficulty concentrating or making decisions? N N  Walking or climbing stairs? N N  Dressing or bathing? N N  Doing errands, shopping? N N  Preparing Food and eating ? N N  Using the Toilet? N N  In the past six months, have you accidently leaked urine? N N  Do you have problems with loss of bowel control? N -  Managing your Medications? N -   Managing your Finances? N -  Housekeeping or managing your Housekeeping? N -  Some recent data might be hidden    Fall/Depression Screening:    PHQ 2/9 Scores 02/22/2017 02/24/2016 11/26/2014  PHQ - 2 Score 2 0 1  PHQ- 9 Score 9 - -    Assessment:   Spouse of [employer] employee and [facility] member with Type I DM and hyperlipidemia, now wearing insulin pump.  Most recent Hgb  A1C= 6.3% on 12/01/16 and lipid panel was normal on 09/24/16.  Plan:  . [facility] CM Care Plan Problem One        Most Recent Value   Care Plan Problem One  Type I DM with most recent A1C= 6.3% on 12/01/16- on insulin pump, hyperlipidemia  with normal lipid panel on    Role Documenting the Problem One  Care Management [place] for Problem One  Active   [facility] Long Term Goal (31-90 days)  Ongoing good glycemic control as evidenced by A1C< 7.0% at next check without increased incidents of hypoglycemia, ongoing good control of hyperlipidemia as evidenced by normal lipid panel at next check   [facility] Long Term Goal Start Date  02/22/17   [facility] Long Term Goal Met Date     Interventions for Problem One Long Term Goal  Reviewed recent diagnosis of aspiration pneumonia and treatment,   congratulated James [name] on success with smoking cessation and discussed option of Wellbutrin if he needs ongoing assistance once his Chantix prescription is exhausted, reviewed blood sugar readings and pump[settings, reviewed results of Hgb A1C drawn on 12/01/16 and lipid profile drawn 09/24/16 and discussed targets, will arrange for [facility] To Wellness follow up in November 2018      RNCM to fax today's office visit note to [name] and [name] Pharm D at [name] office. RNCM will meet quarterly and as needed with patient per Link To Wellness program guidelines to assist with Type I DM and hyperlipidemia self-management and assess patient's progress toward mutually set goals.  [name] RN,CCM,CDE [place]  Management Coordinator [facility] To [facility]ness Office Phone [phone] Office Fax [phone]

## 2017-02-23 ENCOUNTER — Ambulatory Visit: Payer: 59 | Admitting: Acute Care

## 2017-02-24 ENCOUNTER — Ambulatory Visit: Payer: 59 | Attending: Acute Care

## 2017-02-24 DIAGNOSIS — R1312 Dysphagia, oropharyngeal phase: Secondary | ICD-10-CM | POA: Diagnosis not present

## 2017-02-24 MED FILL — PANTOPRAZOLE SOD DR 20 MG T: 20 | 30 days supply | Qty: 30 | Fill #1

## 2017-02-24 NOTE — Patient Instructions (Signed)
   When eating: Swallow hard, 2-3 times for each bite or sip Take smaller bites and sips "Hock" periodically during your meal and reswallow, to clear built-up food   Put pills in puree (applesauce, yogurt, pudding)

## 2017-02-25 NOTE — Therapy (Signed)
[facility] [address] [place], [place], [address] Phone: [phone]   Fax:  [phone]  Speech Language Pathology Evaluation  Patient Details  Name: James [name] MRN: [id number] Date of Birth: 1955/09/09 Referring Provider: Danae Chen, NP  Encounter Date: 02/24/2017      End of Session - 02/25/17 1643    Visit Number 1   Number of Visits 9   Date for SLP Re-Evaluation 04/29/17   SLP Start Time 70   SLP Stop Time  1617   SLP Time Calculation (min) 43 min   Activity Tolerance Patient tolerated treatment well      Past Medical History:  Diagnosis Date  . ANXIETY 04/03/2007  . Anxiety   . ASTHMA 09/06/2008  . Asthma   . ASTHMATIC BRONCHITIS, ACUTE 10/25/2008  . Bladder neck obstruction   . CARPAL TUNNEL SYNDROME, BILATERAL 07/31/2007   issues resolved, no surgery  . Cervical disc disease   . Chronic bronchitis ([place])    "get it about q yr" (02/12/2014)  . COPD (chronic obstructive pulmonary disease) ([place])   . CORONARY ARTERY DISEASE 04/03/2007  . DEPRESSION 04/03/2007  . Depression   . DIABETES MELLITUS, TYPE I 04/03/2007  . Diabetic retinopathy associated with diabetes mellitus due to underlying condition ([place]) 04/03/2007  . DM W/EYE MANIFESTATIONS, TYPE I, UNCONTROLLED 04/04/2007  . 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 ED (erectile dysfunction)   . History of kidney stones   . HYPERLIPIDEMIA 04/04/2007  . Pneumonia    "several times and again today" (02/13/2104)  . Renal insufficiency   . Seizures ([place])    "insulin seizure from time to time; none in the last couple years" (02/12/2014)  . Spinal stenosis     Past Surgical History:  Procedure Laterality Date  . ANTERIOR CERVICAL DECOMP/DISCECTOMY FUSION  2000   "couple screws and a plate"  . ANTERIOR CERVICAL DECOMP/DISCECTOMY FUSION N/A 06/24/2016   Procedure: ANTERIOR CERVICAL DECOMPRESSION/DISCECTOMY FUSION CERVICAL FOUR - CERVICAL FIVE,  CERVICAL FIVE - CERVICAL SIX; REMOVAL TETHER CERVICAL PLATE;  Surgeon: Jovita Gamma, MD;  Location: [facility];  Service: Neurosurgery;  Laterality: N/A;  ANTERIOR CERVICAL DECOMPRESSION/DISCECTOMY FUSION CERVICAL FOUR - CERVICAL FIVE, CERVICAL FIVE - CERVICAL SIX; REMOVAL TETHER CERVICAL PLATE  . APPENDECTOMY    . BACK SURGERY    . CARDIAC CATHETERIZATION  1990's  . CATARACT EXTRACTION W/ INTRAOCULAR LENS  IMPLANT, BILATERAL Bilateral   . CYSTOSCOPY WITH RETROGRADE PYELOGRAM, URETEROSCOPY AND STENT PLACEMENT Bilateral 04/06/2013   Procedure: BILATERAL CYSTOSCOPY WITH RETROGRADE PYELOGRAMS, STENT PLACEMENTS AND LEFT URETEROSCOPY AND STONE REMOVAL;  Surgeon: Alexis Frock, MD;  Location: [facility];  Service: Urology;  Laterality: Bilateral;  . CYSTOSCOPY WITH STENT PLACEMENT Right 04/12/2013   Procedure: CYSTOSCOPY WITH STENT PLACEMENT;  Surgeon: Alexis Frock, MD;  Location: [facility];  Service: Urology;  Laterality: Right;  . CYSTOSCOPY/RETROGRADE/URETEROSCOPY Bilateral 04/12/2013   Procedure: CYSTOSCOPY/RETROGRADE/URETEROSCOPY;  Surgeon: Alexis Frock, MD;  Location: [facility];  Service: Urology;  Laterality: Bilateral;  RIGHT RETROGRADE   . HOLMIUM LASER APPLICATION Left 9/47/0962   Procedure: HOLMIUM LASER APPLICATION;  Surgeon: Alexis Frock, MD;  Location: [facility];  Service: Urology;  Laterality: Left;  . LYMPH NODE DISSECTION  ~ 1960   groin  . stress cardiolite  09/06/2002  . TONSILLECTOMY    . VITRECTOMY Bilateral     There were no vitals filed for this visit.          SLP Evaluation [facility] -  02/25/17 0001      SLP Visit Information   SLP Received On 02/24/17   Referring Provider Danae Chen, NP   Onset Date December 2017   Medical Diagnosis Aspiration PNA     Subjective   Subjective Pt with modified (MBSS) due to aspirating hamburger and subsequent aspiration PNAPt with ACDF sx 06-24-16 C4-C5, C-5-C6 with removal of C6-C7 hardware placed in 2000. MBSS revealed silent aspiration  with liquids due to impaired timing of laryngeal closure and oral transit. Dys III with thin liquids recommedned with whole meds in puree, slow rate, small sips/bites, multiple swallows, effortful swallow, "hock" and expecotrate, avoid particulate foods.      Prior Functional Status   Cognitive/Linguistic Baseline Within functional limits     Cognition   Overall Cognitive Status Within Functional Limits for tasks assessed     Auditory Comprehension   Overall Auditory Comprehension Appears within functional limits for tasks assessed     Verbal Expression   Overall Verbal Expression Appears within functional limits for tasks assessed     Oral Motor/Sensory Function   Overall Oral Motor/Sensory Function Appears within functional limits for tasks assessed     Motor Speech   Overall Motor Speech Appears within functional limits for tasks assessed                        SLP Education - 02/25/17 1642    Education provided Yes   Education Details swallow precautions, dysphagia diet III (soft), HEP and rationale   Person(s) Educated Patient   Methods Explanation;Demonstration;Verbal cues;Handout   Comprehension Verbalized understanding;Returned demonstration;Verbal cues required;Need further instruction            SLP Long Term Goals - 02/25/17 1657      SLP LONG TERM GOAL #1   Title pt will complete HEP with modified independence x3 sessions   Time 4   Period Weeks  or 8 visits, for all LTGs   Status New     SLP LONG TERM GOAL #2   Title pt will follow swallow precautions with POs over two sessions (modified  independence)   Time 4   Period Weeks   Status New     SLP LONG TERM GOAL #3   Title pt will demo knowledge of dys III diet    Time 4   Period Weeks   Status New          Plan - 02/25/17 1656  Pt with mild oral and mod-severe pharyngeal dysphagia. See MBSS for more details and "Subjective" for precautions. Dys III/thin recommended. V8/tomato  juice or WATER suggested for meals. Possible motor component to dysphagia, SLP recommeded short course of outpatient ST for swallow exercises and adherence to precautions.Pt would benefit from skilled ST to ensure procedure with HEP remains excellent, and to monitor for PO safety and diet tolerance.    Speech Therapy Frequency 2x / week   Duration 4 weeks  (or 8 sessions)   Treatment/Interventions Aspiration precaution training;Pharyngeal strengthening exercises;Diet toleration management by SLP;Compensatory techniques;Environmental controls;Trials of upgraded texture/liquids;Compensatory strategies;Patient/family education;SLP instruction and feedback;Internal/external aids   Potential to Achieve Goals Fair   Potential Considerations Severity of impairments      Patient will benefit from skilled therapeutic intervention in order to improve the following deficits and impairments:   Dysphagia, oropharyngeal phase    Problem List Patient Active Problem List   Diagnosis Date Noted  . HNP (herniated nucleus pulposus), cervical 06/24/2016  .  Hypoglycemia   . Syncope 05/20/2016  . Tobacco abuse 05/20/2016  . Cervical disc disorder with radiculopathy of cervical region 05/20/2016  . Hypoglycemia due to type 1 diabetes mellitus ([place]) 05/20/2016  . Aspiration pneumonia ([place]) 02/12/2014  . Gastroparesis 02/10/2011  . Esophageal reflux 12/21/2010  . TOBACCO USE, QUIT 05/08/2010  . CONTUSION, RIGHT CHEST WALL 04/07/2010  . Chronic asthmatic bronchitis ([place]) 10/25/2008  . ASTHMA 09/06/2008  . COUGH 09/06/2008  . SHOULDER PAIN, RIGHT 08/19/2008  . BLADDER OUTLET OBSTRUCTION 04/15/2008  . RASH AND OTHER NONSPECIFIC SKIN ERUPTION 02/21/2008  . CARPAL TUNNEL SYNDROME, BILATERAL 07/31/2007  . ALLERGIC RHINITIS 07/31/2007  . RENAL INSUFFICIENCY 07/31/2007  . OTHER TENOSYNOVITIS OF HAND AND WRIST 07/31/2007  . 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 HLD (hyperlipidemia) 04/04/2007  . Insulin dependent diabetes mellitus ([place]) 04/03/2007  . DIABETIC RETINOPATHY, PROLIFERATIVE 04/03/2007  . ANXIETY 04/03/2007  . DEPRESSION 04/03/2007  . 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 CAD (coronary artery disease) 04/03/2007  . DYSHIDROSIS 04/03/2007    [facility] ,[place], CCC-SLP  02/25/2017, 5:00 PM  [facility] [address] [place], [place], [address] Phone: [phone]   Fax:  [phone]  Name: James [name] MRN: [id number] Date of Birth: [birth year]

## 2017-02-28 ENCOUNTER — Ambulatory Visit: Payer: 59

## 2017-03-07 ENCOUNTER — Encounter: Payer: 59 | Admitting: Speech Pathology

## 2017-03-09 MED FILL — CHANTIX 1 MG CONT MONTH BOX: 1 | 28 days supply | Qty: 56 | Fill #6

## 2017-03-09 MED FILL — CONTOUR NEXT STRIPS: 30 days supply | Qty: 200 | Fill #4

## 2017-03-22 ENCOUNTER — Encounter: Payer: 59 | Admitting: Speech Pathology

## 2017-03-24 ENCOUNTER — Ambulatory Visit: Payer: 59 | Attending: Acute Care

## 2017-04-01 MED FILL — SYMBICORT 160-4.5 MCG INH: 160-4.5 | 30 days supply | Qty: 10 | Fill #3

## 2017-04-10 ENCOUNTER — Emergency Department (HOSPITAL_COMMUNITY): Payer: 59

## 2017-04-10 ENCOUNTER — Encounter (HOSPITAL_COMMUNITY): Payer: Self-pay | Admitting: Emergency Medicine

## 2017-04-10 ENCOUNTER — Emergency Department (HOSPITAL_COMMUNITY)
Admission: EM | Admit: 2017-04-10 | Discharge: 2017-04-10 | Disposition: A | Discharge status: Home / Self Care | Payer: 59 | Attending: Emergency Medicine | Admitting: Emergency Medicine

## 2017-04-10 DIAGNOSIS — E10319 Type 1 diabetes mellitus with unspecified diabetic retinopathy without macular edema: Secondary | ICD-10-CM | POA: Insufficient documentation

## 2017-04-10 DIAGNOSIS — M25532 Pain in left wrist: Secondary | ICD-10-CM | POA: Diagnosis not present

## 2017-04-10 DIAGNOSIS — M79603 Pain in arm, unspecified: Secondary | ICD-10-CM | POA: Diagnosis not present

## 2017-04-10 DIAGNOSIS — J449 Chronic obstructive pulmonary disease, unspecified: Secondary | ICD-10-CM | POA: Insufficient documentation

## 2017-04-10 DIAGNOSIS — Z79899 Other long term (current) drug therapy: Secondary | ICD-10-CM | POA: Diagnosis not present

## 2017-04-10 DIAGNOSIS — R52 Pain, unspecified: Secondary | ICD-10-CM | POA: Diagnosis not present

## 2017-04-10 DIAGNOSIS — R55 Syncope and collapse: Secondary | ICD-10-CM | POA: Insufficient documentation

## 2017-04-10 DIAGNOSIS — Z87891 Personal history of nicotine dependence: Secondary | ICD-10-CM | POA: Insufficient documentation

## 2017-04-10 DIAGNOSIS — E785 Hyperlipidemia, unspecified: Secondary | ICD-10-CM | POA: Insufficient documentation

## 2017-04-10 DIAGNOSIS — M79601 Pain in right arm: Secondary | ICD-10-CM | POA: Diagnosis not present

## 2017-04-10 DIAGNOSIS — Z794 Long term (current) use of insulin: Secondary | ICD-10-CM | POA: Insufficient documentation

## 2017-04-10 DIAGNOSIS — M25531 Pain in right wrist: Secondary | ICD-10-CM | POA: Diagnosis not present

## 2017-04-10 DIAGNOSIS — I2510 Atherosclerotic heart disease of native coronary artery without angina pectoris: Secondary | ICD-10-CM | POA: Insufficient documentation

## 2017-04-10 LAB — COMPREHENSIVE METABOLIC PANEL
ALK PHOS: 77 U/L (ref 38–126)
ALT: 24 U/L (ref 17–63)
ANION GAP: 5 (ref 5–15)
AST: 25 U/L (ref 15–41)
Albumin: 3.3 g/dL — ABNORMAL LOW (ref 3.5–5.0)
BILIRUBIN TOTAL: 0.7 mg/dL (ref 0.3–1.2)
BUN: 15 mg/dL (ref 6–20)
CALCIUM: 8 mg/dL — AB (ref 8.9–10.3)
CO2: 22 mmol/L (ref 22–32)
Chloride: 107 mmol/L (ref 101–111)
Creatinine, Ser: 0.94 mg/dL (ref 0.61–1.24)
Glucose, Bld: 339 mg/dL — ABNORMAL HIGH (ref 65–99)
POTASSIUM: 3.6 mmol/L (ref 3.5–5.1)
Sodium: 134 mmol/L — ABNORMAL LOW (ref 135–145)
TOTAL PROTEIN: 5 g/dL — AB (ref 6.5–8.1)

## 2017-04-10 LAB — URINALYSIS, ROUTINE W REFLEX MICROSCOPIC
Bilirubin Urine: NEGATIVE
HGB URINE DIPSTICK: NEGATIVE
KETONES UR: NEGATIVE mg/dL
LEUKOCYTES UA: NEGATIVE
NITRITE: NEGATIVE
PH: 6 (ref 5.0–8.0)
Protein, ur: NEGATIVE mg/dL
Specific Gravity, Urine: 1.015 (ref 1.005–1.030)
Squamous Epithelial / LPF: NONE SEEN

## 2017-04-10 LAB — CBC
HCT: 35.9 % — ABNORMAL LOW (ref 39.0–52.0)
HEMOGLOBIN: 13 g/dL (ref 13.0–17.0)
MCH: 32.7 pg (ref 26.0–34.0)
MCHC: 36.2 g/dL — ABNORMAL HIGH (ref 30.0–36.0)
MCV: 90.4 fL (ref 78.0–100.0)
Platelets: 198 10*3/uL (ref 150–400)
RBC: 3.97 MIL/uL — AB (ref 4.22–5.81)
RDW: 12.8 % (ref 11.5–15.5)
WBC: 5.3 10*3/uL (ref 4.0–10.5)

## 2017-04-10 LAB — I-STAT TROPONIN, ED
TROPONIN I, POC: 0 ng/mL (ref 0.00–0.08)
Troponin i, poc: 0 ng/mL (ref 0.00–0.08)

## 2017-04-10 LAB — CBG MONITORING, ED
GLUCOSE-CAPILLARY: 360 mg/dL — AB (ref 65–99)
GLUCOSE-CAPILLARY: 271 mg/dL — AB (ref 65–99)

## 2017-04-10 MED ORDER — SODIUM CHLORIDE 0.9 % IV BOLUS (SEPSIS): 500.0000 mL | Freq: Once | INTRAVENOUS | Status: DC

## 2017-04-10 MED ORDER — SODIUM CHLORIDE 0.9 % IV BOLUS (SEPSIS): 1000.0000 mL | Freq: Once | INTRAVENOUS | Status: DC

## 2017-04-10 NOTE — ED Provider Notes (Addendum)
[facility] DEPT Provider Note   CSN: [id number] Arrival date & time: 04/10/17  6270     History   Chief Complaint Chief Complaint  Patient presents with  . Near Syncope    HPI James [name] is a 61 y.o. male.  Patient indicates this AM felt diaphoretic, bil arm pain, and got nauseated, felt weak. EMS encoded as possible code stemi. Patient indicates that many nights, when lies down to rest has bilateral wrist pain that goes up arm spouse adds pt has carpal tunnel, and they think that may be cause of his pain. Tonight was having that pain, and trouble sleeping, which he indicates is normal for him. Pt states pain got worse, felt nausea, then weak/faint. Called ems. No exertional cp or discomfort. No unusual doe. No pleuritic pain. No leg pain or swelling. Denies heartburn. No palpitations. No recent change in meds. Denies blood loss or melena. No fever or chills.    The history is provided by the patient, the EMS personnel and the spouse.  Near Syncope  Pertinent negatives include no chest pain, no abdominal pain, no headaches and no shortness of breath.    Past Medical History:  Diagnosis Date  . ANXIETY 04/03/2007  . Anxiety   . ASTHMA 09/06/2008  . Asthma   . ASTHMATIC BRONCHITIS, ACUTE 10/25/2008  . Bladder neck obstruction   . CARPAL TUNNEL SYNDROME, BILATERAL 07/31/2007   issues resolved, no surgery  . Cervical disc disease   . Chronic bronchitis ([place])    "get it about q yr" (02/12/2014)  . COPD (chronic obstructive pulmonary disease) ([place])   . CORONARY ARTERY DISEASE 04/03/2007  . DEPRESSION 04/03/2007  . Depression   . DIABETES MELLITUS, TYPE I 04/03/2007  . Diabetic retinopathy associated with diabetes mellitus due to underlying condition ([place]) 04/03/2007  . DM W/EYE MANIFESTATIONS, TYPE I, UNCONTROLLED 04/04/2007  . 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 ED (erectile dysfunction)   . History of kidney stones   . HYPERLIPIDEMIA 04/04/2007  . Pneumonia    "several times and again today" (02/13/2104)  . Renal insufficiency   . Seizures ([place])    "insulin seizure from time to time; none in the last couple years" (02/12/2014)  . Spinal stenosis     Patient Active Problem List   Diagnosis Date Noted  . HNP (herniated nucleus pulposus), cervical 06/24/2016  . Hypoglycemia   . Syncope 05/20/2016  . Tobacco abuse 05/20/2016  . Cervical disc disorder with radiculopathy of cervical region 05/20/2016  . Hypoglycemia due to type 1 diabetes mellitus ([place]) 05/20/2016  . Aspiration pneumonia ([place]) 02/12/2014  . Gastroparesis 02/10/2011  . Esophageal reflux 12/21/2010  . TOBACCO USE, QUIT 05/08/2010  . CONTUSION, RIGHT CHEST WALL 04/07/2010  . Chronic asthmatic bronchitis ([place]) 10/25/2008  . ASTHMA 09/06/2008  . COUGH 09/06/2008  . SHOULDER PAIN, RIGHT 08/19/2008  . BLADDER OUTLET OBSTRUCTION 04/15/2008  . RASH AND OTHER NONSPECIFIC SKIN ERUPTION 02/21/2008  . CARPAL TUNNEL SYNDROME, BILATERAL 07/31/2007  . ALLERGIC RHINITIS 07/31/2007  . RENAL INSUFFICIENCY 07/31/2007  . OTHER TENOSYNOVITIS OF HAND AND WRIST 07/31/2007  . 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 HLD (hyperlipidemia) 04/04/2007  . Insulin dependent diabetes mellitus ([place]) 04/03/2007  . DIABETIC RETINOPATHY, PROLIFERATIVE 04/03/2007  . ANXIETY 04/03/2007  . DEPRESSION 04/03/2007  . 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 CAD (coronary artery disease) 04/03/2007  . DYSHIDROSIS  04/03/2007    Past Surgical History:  Procedure Laterality Date  . ANTERIOR CERVICAL DECOMP/DISCECTOMY FUSION  2000   "couple screws and a plate"  . ANTERIOR CERVICAL DECOMP/DISCECTOMY FUSION N/A 06/24/2016   Procedure: ANTERIOR CERVICAL DECOMPRESSION/DISCECTOMY FUSION CERVICAL FOUR - CERVICAL FIVE, CERVICAL FIVE - CERVICAL SIX; REMOVAL TETHER CERVICAL PLATE;  Surgeon: Jovita Gamma, MD;  Location: [facility];  Service: Neurosurgery;  Laterality: N/A;  ANTERIOR CERVICAL DECOMPRESSION/DISCECTOMY FUSION CERVICAL FOUR - CERVICAL FIVE, CERVICAL FIVE - CERVICAL SIX; REMOVAL TETHER CERVICAL PLATE  . APPENDECTOMY    . BACK SURGERY    . CARDIAC CATHETERIZATION  1990's  . CATARACT EXTRACTION W/ INTRAOCULAR LENS  IMPLANT, BILATERAL Bilateral   . CYSTOSCOPY WITH RETROGRADE PYELOGRAM, URETEROSCOPY AND STENT PLACEMENT Bilateral 04/06/2013   Procedure: BILATERAL CYSTOSCOPY WITH RETROGRADE PYELOGRAMS, STENT PLACEMENTS AND LEFT URETEROSCOPY AND STONE REMOVAL;  Surgeon: Alexis Frock, MD;  Location: [facility];  Service: Urology;  Laterality: Bilateral;  . CYSTOSCOPY WITH STENT PLACEMENT Right 04/12/2013   Procedure: CYSTOSCOPY WITH STENT PLACEMENT;  Surgeon: Alexis Frock, MD;  Location: [facility];  Service: Urology;  Laterality: Right;  . CYSTOSCOPY/RETROGRADE/URETEROSCOPY Bilateral 04/12/2013   Procedure: CYSTOSCOPY/RETROGRADE/URETEROSCOPY;  Surgeon: Alexis Frock, MD;  Location: [facility];  Service: Urology;  Laterality: Bilateral;  RIGHT RETROGRADE   . HOLMIUM LASER APPLICATION Left 01/31/9677   Procedure: HOLMIUM LASER APPLICATION;  Surgeon: Alexis Frock, MD;  Location: [facility];  Service: Urology;  Laterality: Left;  . LYMPH NODE DISSECTION  ~ 1960   groin  . stress cardiolite  09/06/2002  . TONSILLECTOMY    . VITRECTOMY Bilateral        Home Medications    Prior to Admission medications   Medication Sig Start Date End Date Taking? Authorizing Provider  acetaminophen (TYLENOL) 325 MG tablet Take 650 mg by mouth every 6 (six) hours as needed for mild pain.   Yes [provider]  cetirizine (ZYRTEC) 10 MG tablet Take 10 mg by mouth daily.   Yes [provider]  ibuprofen (ADVIL,MOTRIN) 200 MG tablet Take 800 mg by mouth every 6 (six) hours as needed for mild pain.   Yes [provider]  Insulin Human (INSULIN PUMP) SOLN Inject into the skin continuous.  Humalog   Yes [provider]  naproxen sodium (ANAPROX) 220 MG tablet Take 440 mg by mouth 2 (two) times daily as needed (pain).   Yes [provider]  simvastatin (ZOCOR) 40 MG tablet Take 40 mg by mouth at bedtime.    Yes [provider]  traZODone (DESYREL) 150 MG tablet Take 150 mg by mouth at bedtime.   Yes [provider]  varenicline (CHANTIX) 1 MG tablet Take 1 mg by mouth 2 (two) times daily.   Yes [provider]  budesonide-formoterol (SYMBICORT) 160-4.5 MCG/ACT inhaler Inhale 2 puffs into the lungs 2 (two) times daily. Patient not taking: Reported on 04/10/2017 09/27/16   Chesley Mires, MD  HYDROcodone-homatropine [facility]) 5-1.5 MG/5ML syrup Take 5 mLs by mouth every 6 (six) hours as needed for cough. Patient not taking: Reported on 02/02/2017 01/20/17   Magdalen Spatz, NP  insulin lispro (HUMALOG) 100 UNIT/ML injection Med[employer]ronic pump    [provider]  pantoprazole (PROTONIX) 20 MG tablet Take 1 tablet (20 mg total) by mouth daily. Patient not taking: Reported on 04/10/2017 01/26/17   Magdalen Spatz, NP  VENTOLIN HFA 108 (90 Base) MCG/ACT inhaler INHALE 2 PUFFS BY MOUTH INTO THE LUNGS EVERY 6 HOURS AS NEEDED FOR  WHEEZING OR SHORTNESS OF BREATH. Patient not taking: Reported on 04/10/2017 10/14/16   Rigoberto Noel, MD    Family History Family History  Problem Relation Age of Onset  . Stroke Father        strong FH cerebrovascular disease  . Diabetes Brother   . Heart disease Brother        CHF  . Cancer Mother   . Colon cancer Neg Hx     Social History Social History  Substance Use Topics  . Smoking status: Former Smoker    Packs/Marcos: 0.00    Types: Cigarettes    Quit date: 02/09/2017  . Smokeless tobacco: Never Used     Comment: resumed smoking in fall of 2017  . Alcohol use No     Allergies   Augmentin [amoxicillin-pot clavulanate]; Iohexol; Ivp dye [iodinated diagnostic agents]; and Lexapro [escitalopram  oxalate]   Review of Systems Review of Systems  Constitutional: Negative for chills and fever.  HENT: Negative for sore throat.   Eyes: Negative for redness.  Respiratory: Negative for shortness of breath.   Cardiovascular: Positive for near-syncope. Negative for chest pain and palpitations.  Gastrointestinal: Negative for abdominal pain, blood in stool and vomiting.  Genitourinary: Negative for flank pain.  Musculoskeletal: Negative for back pain and neck pain.  Skin: Negative for rash.  Neurological: Negative for headaches.  Hematological: Does not bruise/bleed easily.  Psychiatric/Behavioral: Negative for confusion.     Physical Exam Updated Vital Signs BP 123/68 (BP Location: Right Arm)   Pulse 80   Resp 17   SpO2 97%   Physical Exam  Constitutional: He is oriented to person, place, and time. He appears well-developed and well-nourished. No distress.  HENT:  Head: Atraumatic.  Mouth/Throat: Oropharynx is clear and moist.  Eyes: Conjunctivae are normal.  Neck: Neck supple. No tracheal deviation present. No thyromegaly present.  Cardiovascular: Normal rate, regular rhythm, normal heart sounds and intact distal pulses.  Exam reveals no gallop and no friction rub.   No murmur heard. Pulmonary/Chest: Effort normal and breath sounds normal. No accessory muscle usage. No respiratory distress.  Abdominal: Soft. Bowel sounds are normal. He exhibits no distension. There is no tenderness.  Genitourinary:  Genitourinary Comments: No cva tenderness  Musculoskeletal: He exhibits no edema or tenderness.  Radial pulse 2+ bil. No arm swelling. No focal bony tenderness  Neurological: He is alert and oriented to person, place, and time.  Speech normal/fluent. Motor intact bil.   Skin: Skin is warm and dry. No rash noted. He is not diaphoretic.  Psychiatric: He has a normal mood and affect.  Nursing note and vitals reviewed.    ED Treatments / Results  Labs (all labs ordered are  listed, but only abnormal results are displayed) Results for orders placed or performed during the hospital encounter of 04/10/17  Comprehensive metabolic panel  Result Value Ref Range   Sodium 134 (L) 135 - 145 mmol/L   Potassium 3.6 3.5 - 5.1 mmol/L   Chloride 107 101 - 111 mmol/L   CO2 22 22 - 32 mmol/L   Glucose, Bld 339 (H) 65 - 99 mg/dL   BUN 15 6 - 20 mg/dL   Creatinine, Ser 0.94 0.61 - 1.24 mg/dL   Calcium 8.0 (L) 8.9 - 10.3 mg/dL   Total Protein 5.0 (L) 6.5 - 8.1 g/dL   Albumin 3.3 (L) 3.5 - 5.0 g/dL   AST 25 15 - 41 U/L   ALT 24 17 - 63 U/L  Alkaline Phosphatase 77 38 - 126 U/L   Total Bilirubin 0.7 0.3 - 1.2 mg/dL   GFR calc non Af Amer >60 >60 mL/min   GFR calc Af Amer >60 >60 mL/min   Anion gap 5 5 - 15  CBC  Result Value Ref Range   WBC 5.3 4.0 - 10.5 K/uL   RBC 3.97 (L) 4.22 - 5.81 MIL/uL   Hemoglobin 13.0 13.0 - 17.0 g/dL   HCT 35.9 (L) 39.0 - 52.0 %   MCV 90.4 78.0 - 100.0 fL   MCH 32.7 26.0 - 34.0 pg   MCHC 36.2 (H) 30.0 - 36.0 g/dL   RDW 12.8 11.5 - 15.5 %   Platelets 198 150 - 400 K/uL  CBG monitoring, ED  Result Value Ref Range   Glucose-Capillary 360 (H) 65 - 99 mg/dL  I-stat troponin, ED  Result Value Ref Range   Troponin i, poc 0.00 0.00 - 0.08 ng/mL   Comment 3           Dg Chest Port 1 View  Result Date: 04/10/2017 CLINICAL DATA:  Bilateral arm pain EXAM: PORTABLE CHEST 1 VIEW COMPARISON:  Chest radiograph 02/02/2017 FINDINGS: Shallow lung inflation with mild vascular congestion. No focal airspace consolidation or overt pulmonary edema. No pleural effusion or pneumothorax. IMPRESSION: No active disease. Electronically Signed   By: Ulyses Jarred M.D.   On: 04/10/2017 05:29    EKG  EKG Interpretation  Date/Time:  Sunday April 10 2017 04:33:15 EDT Ventricular Rate:  70 PR Interval:    QRS Duration: 99 QT Interval:  447 QTC Calculation: 483 R Axis:   85 Text Interpretation:  Sinus rhythm Nonspecific ST abnormality No significant change  since last tracing Confirmed by Lajean Saver [id number]) on 04/10/2017 6:38:19 AM       Radiology Dg Chest Port 1 View  Result Date: 04/10/2017 CLINICAL DATA:  Bilateral arm pain EXAM: PORTABLE CHEST 1 VIEW COMPARISON:  Chest radiograph 02/02/2017 FINDINGS: Shallow lung inflation with mild vascular congestion. No focal airspace consolidation or overt pulmonary edema. No pleural effusion or pneumothorax. IMPRESSION: No active disease. Electronically Signed   By: Ulyses Jarred M.D.   On: 04/10/2017 05:29    Procedures Procedures (including critical care time)  Medications Ordered in ED Medications  sodium chloride 0.9 % bolus 500 mL (not administered)  sodium chloride 0.9 % bolus 1,000 mL (not administered)     Initial Impression / Assessment and Plan / ED Course  I have reviewed the triage vital signs and the nursing notes.  Pertinent labs & imaging results that were available during my care of the patient were reviewed by me and considered in my medical decision making (see chart for details).  Pt was initially evaluated by cardiology/code stemi per ems incode.    Cardiology compares current ecg to old, and cancelled code stemi.   Labs sent. Iv ns bolus.   Pt indicates feels improved.   Reviewed nursing notes and prior charts for additional history.   Given additional hx from patient/spouse, pts symptoms seem most likely related to vasovagal event s/p having his recurrent bil wrist pain and forearm pain.   Pt continues to feel improved.  Delta trop neg. No chest pain.      Final Clinical Impressions(s) / ED Diagnoses   Final diagnoses:  None    New Prescriptions New Prescriptions   No medications on file       Lajean Saver, MD 04/12/17 1126

## 2017-04-10 NOTE — Discharge Instructions (Signed)
It was our pleasure to provide your ER care today - we hope that you feel better.  Rest. Drink adequate fluids.   Take acetaminophen and/or ibuprofen as need for pain.  Your blood sugar is high this AM - continue insulin and blood sugar monitoring - follow up closely with your doctor.   Follow up with your doctor in the next couple days for recheck.  Return to ER if worse, new symptoms, fevers, weak/fainting, chest pain, trouble breathing, other concern.

## 2017-04-10 NOTE — ED Triage Notes (Signed)
Patient is from home, patient awoke around 1am, having bilateral arm pain, he was found pale, diaphoretic and hypotensive.  Patient is pain free upon arrival to ED.  Patient is a diabetic, CBG was 66 upon EMS arrival.  Started D10 and patient felt better and CBG was 451 and then dropped to 310 upon arrival.  Patient is a brittle diabetic.

## 2017-04-10 NOTE — ED Notes (Signed)
Pt bolus self with insulin pump

## 2017-04-12 DIAGNOSIS — Z4681 Encounter for fitting and adjustment of insulin pump: Secondary | ICD-10-CM | POA: Diagnosis not present

## 2017-04-12 DIAGNOSIS — Z23 Encounter for immunization: Secondary | ICD-10-CM | POA: Diagnosis not present

## 2017-04-12 DIAGNOSIS — Z6826 Body mass index (BMI) 26.0-26.9, adult: Secondary | ICD-10-CM | POA: Diagnosis not present

## 2017-04-12 DIAGNOSIS — E1029 Type 1 diabetes mellitus with other diabetic kidney complication: Secondary | ICD-10-CM | POA: Diagnosis not present

## 2017-04-12 DIAGNOSIS — N08 Glomerular disorders in diseases classified elsewhere: Secondary | ICD-10-CM | POA: Diagnosis not present

## 2017-04-12 DIAGNOSIS — M50122 Cervical disc disorder at C5-C6 level with radiculopathy: Secondary | ICD-10-CM | POA: Diagnosis not present

## 2017-04-26 MED FILL — CONTOUR NEXT STRIPS: 30 days supply | Qty: 200 | Fill #5

## 2017-04-26 MED FILL — PANTOPRAZOLE SOD DR 20 MG T: 20 | 30 days supply | Qty: 30 | Fill #0

## 2017-04-26 MED FILL — HumaLOG 100 UNIT/ML SOLN: 100 | 70 days supply | Qty: 70 | Fill #2

## 2017-04-29 MED FILL — METHYLPREDNISOLONE 4 MG TAB: 4 | 6 days supply | Qty: 21 | Fill #0

## 2017-04-29 MED FILL — GABAPENTIN 300 MG CAPSULE: 300 | 60 days supply | Qty: 60 | Fill #0

## 2017-05-10 ENCOUNTER — Ambulatory Visit: Payer: 59 | Admitting: Pulmonary Disease

## 2017-05-16 DIAGNOSIS — M5030 Other cervical disc degeneration, unspecified cervical region: Secondary | ICD-10-CM | POA: Diagnosis not present

## 2017-05-16 DIAGNOSIS — M546 Pain in thoracic spine: Secondary | ICD-10-CM | POA: Diagnosis not present

## 2017-05-16 DIAGNOSIS — G5603 Carpal tunnel syndrome, bilateral upper limbs: Secondary | ICD-10-CM | POA: Diagnosis not present

## 2017-05-16 DIAGNOSIS — M549 Dorsalgia, unspecified: Secondary | ICD-10-CM | POA: Diagnosis not present

## 2017-05-16 DIAGNOSIS — M542 Cervicalgia: Secondary | ICD-10-CM | POA: Diagnosis not present

## 2017-05-16 DIAGNOSIS — M4722 Other spondylosis with radiculopathy, cervical region: Secondary | ICD-10-CM | POA: Diagnosis not present

## 2017-05-20 ENCOUNTER — Other Ambulatory Visit (HOSPITAL_COMMUNITY): Payer: Self-pay | Admitting: Neurosurgery

## 2017-05-20 DIAGNOSIS — M4722 Other spondylosis with radiculopathy, cervical region: Secondary | ICD-10-CM

## 2017-05-27 MED FILL — AMOXICILLIN 500 MG CAPSULE: 500 | 7 days supply | Qty: 21 | Fill #0

## 2017-05-27 MED FILL — CHLORHEXIDINE 0.12% RINSE: 0.12 | 16 days supply | Qty: 473 | Fill #0

## 2017-05-30 ENCOUNTER — Other Ambulatory Visit: Payer: Self-pay | Admitting: *Deleted

## 2017-05-30 MED FILL — SIMVASTATIN 40 MG TABLET: 40 | 90 days supply | Qty: 90 | Fill #1

## 2017-05-30 NOTE — Patient Outreach (Signed)
Left message on home number requesting return phone call so that the information regarding the transition of diabetes disease self managment services from [facility] to [facility] can be discussed. Await return call from patient. [name] RN,CCM,CDE [place] Management Coordinator [facility] [employer] [phone] Office Fax [phone]

## 2017-05-31 ENCOUNTER — Other Ambulatory Visit: Payer: Self-pay | Admitting: *Deleted

## 2017-05-31 NOTE — Patient Outreach (Signed)
Returning call to patient's wife [name]. Requested she call this RNCM after 2:30 pm today to discuss changes to the [facility] program. Await return call. [name] RN,CCM,CDE [place] Management Coordinator [facility] [employer] [phone] Office Fax [phone]

## 2017-05-31 NOTE — Patient Outreach (Addendum)
Spoke with patient's wife [name] via phone to discuss the changes to the [facility] program for 2019. Advised her to complete the health risk assessment for herself and Karron at the [email or web address] website before the deadline of 06/02/17. Also informed [name] that [place] will receive a letter mailed to their home address with details of the transition. Will close case to the diabetes [facility] RN,CCM,CDE [place] Management Coordinator [facility] [employer] [phone] Office Fax [phone]

## 2017-06-04 ENCOUNTER — Encounter (HOSPITAL_COMMUNITY): Payer: Self-pay | Admitting: Emergency Medicine

## 2017-06-04 ENCOUNTER — Other Ambulatory Visit: Payer: Self-pay

## 2017-06-04 ENCOUNTER — Emergency Department (HOSPITAL_COMMUNITY)
Admission: EM | Admit: 2017-06-04 | Discharge: 2017-06-04 | Disposition: A | Discharge status: Home / Self Care | Payer: 59 | Attending: Emergency Medicine | Admitting: Emergency Medicine

## 2017-06-04 ENCOUNTER — Ambulatory Visit (HOSPITAL_COMMUNITY): Payer: 59

## 2017-06-04 DIAGNOSIS — G5603 Carpal tunnel syndrome, bilateral upper limbs: Secondary | ICD-10-CM | POA: Insufficient documentation

## 2017-06-04 DIAGNOSIS — J45909 Unspecified asthma, uncomplicated: Secondary | ICD-10-CM | POA: Insufficient documentation

## 2017-06-04 DIAGNOSIS — M25531 Pain in right wrist: Secondary | ICD-10-CM | POA: Diagnosis not present

## 2017-06-04 DIAGNOSIS — J449 Chronic obstructive pulmonary disease, unspecified: Secondary | ICD-10-CM | POA: Diagnosis not present

## 2017-06-04 DIAGNOSIS — M25532 Pain in left wrist: Secondary | ICD-10-CM | POA: Diagnosis not present

## 2017-06-04 DIAGNOSIS — Z79899 Other long term (current) drug therapy: Secondary | ICD-10-CM | POA: Insufficient documentation

## 2017-06-04 DIAGNOSIS — I2510 Atherosclerotic heart disease of native coronary artery without angina pectoris: Secondary | ICD-10-CM | POA: Diagnosis not present

## 2017-06-04 DIAGNOSIS — Z880 Allergy status to penicillin: Secondary | ICD-10-CM | POA: Insufficient documentation

## 2017-06-04 DIAGNOSIS — Z87891 Personal history of nicotine dependence: Secondary | ICD-10-CM | POA: Insufficient documentation

## 2017-06-04 DIAGNOSIS — E1065 Type 1 diabetes mellitus with hyperglycemia: Secondary | ICD-10-CM | POA: Insufficient documentation

## 2017-06-04 DIAGNOSIS — E103299 Type 1 diabetes mellitus with mild nonproliferative diabetic retinopathy without macular edema, unspecified eye: Secondary | ICD-10-CM | POA: Diagnosis not present

## 2017-06-04 DIAGNOSIS — Z8669 Personal history of other diseases of the nervous system and sense organs: Secondary | ICD-10-CM

## 2017-06-04 DIAGNOSIS — E162 Hypoglycemia, unspecified: Secondary | ICD-10-CM | POA: Diagnosis not present

## 2017-06-04 DIAGNOSIS — E1029 Type 1 diabetes mellitus with other diabetic kidney complication: Secondary | ICD-10-CM | POA: Diagnosis not present

## 2017-06-04 DIAGNOSIS — R9431 Abnormal electrocardiogram [ECG] [EKG]: Secondary | ICD-10-CM | POA: Diagnosis not present

## 2017-06-04 DIAGNOSIS — E161 Other hypoglycemia: Secondary | ICD-10-CM | POA: Diagnosis not present

## 2017-06-04 LAB — CBG MONITORING, ED
GLUCOSE-CAPILLARY: 244 mg/dL — AB (ref 65–99)
Glucose-Capillary: 128 mg/dL — ABNORMAL HIGH (ref 65–99)

## 2017-06-04 MED ORDER — OXYCODONE-ACETAMINOPHEN 5-325 MG PO TABS
1.0000 | ORAL_TABLET | Freq: Once | ORAL | Status: AC
  Administered 2017-06-04: 1 via ORAL
  Filled 2017-06-04: qty 1

## 2017-06-04 MED ORDER — OXYCODONE-ACETAMINOPHEN 5-325 MG PO TABS: 1.0000 | ORAL_TABLET | ORAL | 0 refills | Status: DC | PRN

## 2017-06-04 NOTE — ED Provider Notes (Signed)
[facility] EMERGENCY DEPARTMENT Provider Note   CSN: [id number] Arrival date & time: 06/04/17  1607     History   Chief Complaint Chief Complaint  Patient presents with  . Hyperglycemia    HPI James [name] is a 61 y.o. male.  Patient presents with bilateral wrist pain that woke him from sleep tonight. He has a history of bilateral carpal tunnel and he feels the pain is consistent with this history. No swelling, redness or fever. No numbness or weakness. He reports he was seen by Dr. Sherwood Gambler recently and was scheduled for a cervical MRI. He has a history of cervical surgery by Dr. Sherwood Gambler but is not having any neck pain tonight. He denies other symptoms. He does not take anything at home on a regular basis for pain.   The history is provided by the patient. No language interpreter was used.  Hyperglycemia  Associated symptoms: no fever and no weakness     Past Medical History:  Diagnosis Date  . ANXIETY 04/03/2007  . Anxiety   . ASTHMA 09/06/2008  . Asthma   . ASTHMATIC BRONCHITIS, ACUTE 10/25/2008  . Bladder neck obstruction   . CARPAL TUNNEL SYNDROME, BILATERAL 07/31/2007   issues resolved, no surgery  . Cervical disc disease   . Chronic bronchitis ([place])    "get it about q yr" (02/12/2014)  . COPD (chronic obstructive pulmonary disease) ([place])   . CORONARY ARTERY DISEASE 04/03/2007  . DEPRESSION 04/03/2007  . Depression   . DIABETES MELLITUS, TYPE I 04/03/2007  . Diabetic retinopathy associated with diabetes mellitus due to underlying condition ([place]) 04/03/2007  . DM W/EYE MANIFESTATIONS, TYPE I, UNCONTROLLED 04/04/2007  . 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 ED (erectile dysfunction)   . History of kidney stones   . HYPERLIPIDEMIA 04/04/2007  . Pneumonia    "several times and again today" (02/13/2104)  . Renal insufficiency   . Seizures ([place])    "insulin seizure from time to time; none in the last couple years" (02/12/2014)  . Spinal  stenosis     Patient Active Problem List   Diagnosis Date Noted  . HNP (herniated nucleus pulposus), cervical 06/24/2016  . Hypoglycemia   . Syncope 05/20/2016  . Tobacco abuse 05/20/2016  . Cervical disc disorder with radiculopathy of cervical region 05/20/2016  . Hypoglycemia due to type 1 diabetes mellitus ([place]) 05/20/2016  . Aspiration pneumonia ([place]) 02/12/2014  . Gastroparesis 02/10/2011  . Esophageal reflux 12/21/2010  . TOBACCO USE, QUIT 05/08/2010  . CONTUSION, RIGHT CHEST WALL 04/07/2010  . Chronic asthmatic bronchitis ([place]) 10/25/2008  . ASTHMA 09/06/2008  . COUGH 09/06/2008  . SHOULDER PAIN, RIGHT 08/19/2008  . BLADDER OUTLET OBSTRUCTION 04/15/2008  . RASH AND OTHER NONSPECIFIC SKIN ERUPTION 02/21/2008  . CARPAL TUNNEL SYNDROME, BILATERAL 07/31/2007  . ALLERGIC RHINITIS 07/31/2007  . RENAL INSUFFICIENCY 07/31/2007  . OTHER TENOSYNOVITIS OF HAND AND WRIST 07/31/2007  . 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 HLD (hyperlipidemia) 04/04/2007  . Insulin dependent diabetes mellitus ([place]) 04/03/2007  . DIABETIC RETINOPATHY, PROLIFERATIVE 04/03/2007  . ANXIETY 04/03/2007  . DEPRESSION 04/03/2007  . 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 CAD (coronary artery disease) 04/03/2007  . DYSHIDROSIS 04/03/2007    Past Surgical History:  Procedure Laterality Date  . ANTERIOR CERVICAL DECOMP/DISCECTOMY FUSION  2000   "couple screws and a plate"  . ANTERIOR CERVICAL DECOMPRESSION/DISCECTOMY FUSION  CERVICAL FOUR - CERVICAL FIVE, CERVICAL FIVE - CERVICAL SIX; REMOVAL TETHER CERVICAL PLATE N/A 27/01/8241   Performed by Jovita Gamma, MD at [facility]    . BACK SURGERY    . BILATERAL CYSTOSCOPY WITH RETROGRADE PYELOGRAMS, STENT PLACEMENTS AND LEFT URETEROSCOPY AND STONE REMOVAL Bilateral 04/06/2013   Performed by Phebe Colla, MD at [facility] ORS  . CARDIAC  CATHETERIZATION  1990's  . CATARACT EXTRACTION W/ INTRAOCULAR LENS  IMPLANT, BILATERAL Bilateral   . CYSTOSCOPY WITH STENT PLACEMENT Right 04/12/2013   Performed by Phebe Colla, MD at [facility] ORS  . CYSTOSCOPY/RETROGRADE/URETEROSCOPY Bilateral 04/12/2013   Performed by Phebe Colla, MD at [facility] ORS  . HOLMIUM LASER APPLICATION Left 3/53/6144   Performed by Phebe Colla, MD at [facility] ORS  . LYMPH NODE DISSECTION  ~ 1960   groin  . stress cardiolite  09/06/2002  . TONSILLECTOMY    . VITRECTOMY Bilateral        Home Medications    Prior to Admission medications   Medication Sig Start Date End Date Taking? Authorizing Provider  acetaminophen (TYLENOL) 325 MG tablet Take 650 mg by mouth every 6 (six) hours as needed for mild pain.   Yes [provider]  cetirizine (ZYRTEC) 10 MG tablet Take 10 mg by mouth daily.   Yes [provider]  ibuprofen (ADVIL,MOTRIN) 200 MG tablet Take 800 mg by mouth every 6 (six) hours as needed for mild pain.   Yes [provider]  Insulin Human (INSULIN PUMP) SOLN Inject into the skin continuous. Humalog   Yes [provider]  naproxen sodium (ANAPROX) 220 MG tablet Take 440 mg by mouth 2 (two) times daily as needed (pain).   Yes [provider]  simvastatin (ZOCOR) 40 MG tablet Take 40 mg by mouth at bedtime.    Yes [provider]  traZODone (DESYREL) 150 MG tablet Take 150 mg by mouth at bedtime.   Yes [provider]  varenicline (CHANTIX) 1 MG tablet Take 1 mg by mouth 2 (two) times daily.   Yes [provider]  budesonide-formoterol (SYMBICORT) 160-4.5 MCG/ACT inhaler Inhale 2 puffs into the lungs 2 (two) times daily. Patient not taking: Reported on 04/10/2017 09/27/16   Chesley Mires, MD  HYDROcodone-homatropine [facility]) 5-1.5 MG/5ML syrup Take 5 mLs by mouth every 6 (six) hours as needed for cough. Patient not taking: Reported on 02/02/2017 01/20/17   Magdalen Spatz, NP  pantoprazole (PROTONIX) 20 MG  tablet Take 1 tablet (20 mg total) by mouth daily. Patient not taking: Reported on 04/10/2017 01/26/17   Magdalen Spatz, NP  VENTOLIN HFA 108 (90 Base) MCG/ACT inhaler INHALE 2 PUFFS BY MOUTH INTO THE LUNGS EVERY 6 HOURS AS NEEDED FOR WHEEZING OR SHORTNESS OF BREATH. Patient not taking: Reported on 04/10/2017 10/14/16   Rigoberto Noel, MD    Family History Family History  Problem Relation Age of Onset  . Stroke Father        strong FH cerebrovascular disease  . Diabetes Brother   . Heart disease Brother        CHF  . Cancer Mother   . Colon cancer Neg Hx     Social History Social History   Tobacco Use  . Smoking status: Former Smoker    Packs/Caroll: 0.00    Types: Cigarettes    Last attempt to quit: 02/09/2017    Years since quitting: 0.3  . Smokeless tobacco: Never Used  . Tobacco comment: resumed smoking  in fall of 2017  Substance Use Topics  . Alcohol use: No  . Drug use: No     Allergies   Augmentin [amoxicillin-pot clavulanate]; Iohexol; Ivp dye [iodinated diagnostic agents]; and Lexapro [escitalopram oxalate]   Review of Systems Review of Systems  Constitutional: Negative for chills and fever.  Respiratory: Negative.   Cardiovascular: Negative.   Genitourinary: Negative for enuresis.  Musculoskeletal: Negative for neck pain.       See HPI  Skin: Negative.  Negative for color change and wound.  Neurological: Negative.  Negative for weakness and numbness.     Physical Exam Updated Vital Signs BP (!) 104/55   Pulse 71   Temp (!) 96.3 F (35.7 C) (Oral)   Resp 12   SpO2 94%   Physical Exam  Constitutional: He is oriented to person, place, and time. He appears well-developed and well-nourished.  HENT:  Head: Normocephalic and atraumatic.  Eyes: Pupils are equal, round, and reactive to light.  Neck: Normal range of motion. Neck supple.  Cardiovascular: Intact distal pulses.  Pulmonary/Chest: Effort normal.  Abdominal: There is no tenderness.    Musculoskeletal: Normal range of motion. He exhibits no deformity.  No midline cervical tenderness. Preserved ROM of neck. Bilateral UE's unremarkable in appearance, without redness or swelling. FROM hands and wrists with pain. Wrists and distal forearms generally tender.  No weakness on grips.   Neurological: He is alert and oriented to person, place, and time. He displays normal reflexes. No sensory deficit.  Skin: Skin is warm and dry.  Psychiatric: He has a normal mood and affect.     ED Treatments / Results  Labs (all labs ordered are listed, but only abnormal results are displayed) Labs Reviewed  CBG MONITORING, ED - Abnormal; Notable for the following components:      Result Value   Glucose-Capillary 128 (*)    All other components within normal limits  CBG MONITORING, ED    EKG  EKG Interpretation  Date/Time:  Saturday June 04 2017 03:39:21 EST Ventricular Rate:  68 PR Interval:    QRS Duration: 94 QT Interval:  426 QTC Calculation: 026 R Axis:   91 Text Interpretation:  Sinus rhythm Right axis deviation Probable anteroseptal infarct, old Minimal ST elevation, inferior leads No significant change since last tracing Confirmed by Pryor Curia [phone]) on 06/04/2017 4:03:49 AM       Radiology No results found.  Procedures Procedures (including critical care time)  Medications Ordered in ED Medications  oxyCODONE-acetaminophen (PERCOCET/ROXICET) 5-325 MG per tablet 1 tablet (1 tablet Oral Given 06/04/17 0407)     Initial Impression / Assessment and Plan / ED Course  I have reviewed the triage vital signs and the nursing notes.  Pertinent labs & imaging results that were available during my care of the patient were reviewed by me and considered in my medical decision making (see chart for details).     Patient here with pain he feels is related to his carpal tunnel in hands/wrists bilaterally. No fever, redness or swelling. Pain woke him from sleep and  was severe.   Percocet x 1 in ED with relief of pain. His movement has increased on re-examination. He continues to deny any numbness or weakness of the extremities. No bowel/bladder incontinence. No neck pain.   Urgent MRI is not felt necessary tonight. He can have the scheduled study on Monday.   Per EMS, he was hypoglycemic on their arrival to 39. Amp D50 given. He was 128  on arrival here. He is observed over time. Repeat CBG is 244.  Final Clinical Impressions(s) / ED Diagnoses   Final diagnoses:  None   1. Bilateral wrist pain 2. History of carpal tunnel   ED Discharge Orders    None       Charlann Lange, PA-C 06/04/17 6067    Ward, Delice Bison, DO 06/04/17 0630

## 2017-06-04 NOTE — Discharge Instructions (Signed)
Monitor your blood sugar frequently throughout today. Continue your regular medications.   Take ibuprofen for wrist pain and percocet for severe pain. Follow up with hand orthopedics as planned for further management of carpal tunnel.

## 2017-06-04 NOTE — ED Triage Notes (Signed)
Patient here from home with bilateral wrist pain.  Patient woke up with excruciating pain due to his carpal tunnel.  Patient usually is hypoglycemic when his hands do this, which it was 84 with EMS.  Patient was given D10 en route to ED, 156ml.  Patient was given 4 81mg  ASA due to some elevation on EKG.  No history of CAD.

## 2017-06-04 NOTE — ED Notes (Signed)
Checked CBG 128. RN Gretta Cool informed

## 2017-06-05 ENCOUNTER — Ambulatory Visit (HOSPITAL_COMMUNITY): Payer: 59

## 2017-06-06 ENCOUNTER — Ambulatory Visit (HOSPITAL_COMMUNITY)
Admission: RE | Admit: 2017-06-06 | Discharge: 2017-06-06 | Disposition: A | Discharge status: Home / Self Care | Payer: 59 | Source: Ambulatory Visit | Attending: Neurosurgery | Admitting: Neurosurgery

## 2017-06-06 DIAGNOSIS — M542 Cervicalgia: Secondary | ICD-10-CM | POA: Diagnosis not present

## 2017-06-06 DIAGNOSIS — M4722 Other spondylosis with radiculopathy, cervical region: Secondary | ICD-10-CM | POA: Insufficient documentation

## 2017-06-06 DIAGNOSIS — Z981 Arthrodesis status: Secondary | ICD-10-CM | POA: Diagnosis not present

## 2017-06-06 DIAGNOSIS — E1065 Type 1 diabetes mellitus with hyperglycemia: Secondary | ICD-10-CM | POA: Diagnosis not present

## 2017-06-10 MED FILL — CONTOUR NEXT STRIPS: 30 days supply | Qty: 200 | Fill #6

## 2017-06-13 DIAGNOSIS — G5603 Carpal tunnel syndrome, bilateral upper limbs: Secondary | ICD-10-CM | POA: Diagnosis not present

## 2017-06-13 DIAGNOSIS — M5030 Other cervical disc degeneration, unspecified cervical region: Secondary | ICD-10-CM | POA: Diagnosis not present

## 2017-06-13 DIAGNOSIS — M4722 Other spondylosis with radiculopathy, cervical region: Secondary | ICD-10-CM | POA: Diagnosis not present

## 2017-07-04 MED FILL — traZODone HCL 150 MG TABS: 150 | 90 days supply | Qty: 90 | Fill #1

## 2017-07-07 DIAGNOSIS — E1029 Type 1 diabetes mellitus with other diabetic kidney complication: Secondary | ICD-10-CM | POA: Diagnosis not present

## 2017-07-07 DIAGNOSIS — Z794 Long term (current) use of insulin: Secondary | ICD-10-CM | POA: Diagnosis not present

## 2017-07-07 DIAGNOSIS — Z6825 Body mass index (BMI) 25.0-25.9, adult: Secondary | ICD-10-CM | POA: Diagnosis not present

## 2017-07-07 DIAGNOSIS — Z4681 Encounter for fitting and adjustment of insulin pump: Secondary | ICD-10-CM | POA: Diagnosis not present

## 2017-07-07 DIAGNOSIS — F17200 Nicotine dependence, unspecified, uncomplicated: Secondary | ICD-10-CM | POA: Diagnosis not present

## 2017-07-07 MED FILL — CONTOUR NEXT STRIPS: 88 days supply | Qty: 700 | Fill #0

## 2017-07-07 MED FILL — BUPROPION SR 150 MG TABLET: 150 | 90 days supply | Qty: 180 | Fill #0

## 2017-07-08 MED FILL — SYMBICORT 160-4.5 MCG INH: 160-4.5 | 30 days supply | Qty: 10 | Fill #4

## 2017-07-18 DIAGNOSIS — G629 Polyneuropathy, unspecified: Secondary | ICD-10-CM | POA: Diagnosis not present

## 2017-07-18 DIAGNOSIS — G5623 Lesion of ulnar nerve, bilateral upper limbs: Secondary | ICD-10-CM | POA: Diagnosis not present

## 2017-07-18 DIAGNOSIS — G5603 Carpal tunnel syndrome, bilateral upper limbs: Secondary | ICD-10-CM | POA: Diagnosis not present

## 2017-07-18 MED FILL — GABAPENTIN 300 MG CAPS: 300 | 60 days supply | Qty: 60 | Fill #0

## 2017-08-26 DIAGNOSIS — E1065 Type 1 diabetes mellitus with hyperglycemia: Secondary | ICD-10-CM | POA: Diagnosis not present

## 2017-08-26 MED FILL — SYMBICORT 160-4.5 MCG INH: 160-4.5 | 30 days supply | Qty: 10 | Fill #5

## 2017-09-02 MED FILL — SIMVASTATIN 40 MG TABLET: 40 | 90 days supply | Qty: 90 | Fill #2

## 2017-09-02 MED FILL — HumaLOG 100 UNIT/ML SOLN: 100 | 90 days supply | Qty: 90 | Fill #0

## 2017-09-02 MED FILL — CHANTIX 1 MG CONT MONTH BOX: 1 | 28 days supply | Qty: 56 | Fill #0

## 2017-09-07 ENCOUNTER — Ambulatory Visit: Payer: 59 | Admitting: Pulmonary Disease

## 2017-09-07 ENCOUNTER — Ambulatory Visit (INDEPENDENT_AMBULATORY_CARE_PROVIDER_SITE_OTHER)
Admission: RE | Admit: 2017-09-07 | Discharge: 2017-09-07 | Disposition: A | Discharge status: Home / Self Care | Payer: 59 | Source: Ambulatory Visit | Attending: Pulmonary Disease | Admitting: Pulmonary Disease

## 2017-09-07 ENCOUNTER — Encounter: Payer: Self-pay | Admitting: Pulmonary Disease

## 2017-09-07 VITALS — BP 114/72 | HR 88 | Ht 69.0 in | Wt 167.0 lb

## 2017-09-07 DIAGNOSIS — J690 Pneumonitis due to inhalation of food and vomit: Secondary | ICD-10-CM

## 2017-09-07 DIAGNOSIS — J189 Pneumonia, unspecified organism: Secondary | ICD-10-CM | POA: Diagnosis not present

## 2017-09-07 MED ORDER — PREDNISONE 10 MG PO TABS: ORAL_TABLET | ORAL | 0 refills | Status: DC

## 2017-09-07 MED ORDER — METHYLPREDNISOLONE ACETATE 80 MG/ML IJ SUSP
80.0000 mg | Freq: Once | INTRAMUSCULAR | Status: AC
  Administered 2017-09-07: 80 mg via INTRAMUSCULAR

## 2017-09-07 MED ORDER — HYDROCODONE-HOMATROPINE 5-1.5 MG/5ML PO SYRP: 5.0000 mL | ORAL_SOLUTION | Freq: Two times a day (BID) | ORAL | 0 refills | Status: DC | PRN

## 2017-09-07 MED FILL — predniSONE 10 MG TABS: 10 | 16 days supply | Qty: 40 | Fill #0

## 2017-09-07 MED FILL — HYDROCODONE-HOMATROPINE SOL: 5-1.5 | 12 days supply | Qty: 120 | Fill #0

## 2017-09-07 NOTE — Patient Instructions (Signed)
Solu-Medrol 80 mg IM x1  Chest x-ray today.-Based on this we will decide about antibiotic.  Prednisone 10 mg tabs Take 4 tabs  daily with food x 4 days, then 3 tabs daily x 4 days, then 2 tabs daily x 4 days, then 1 tab daily x4 days then stop. #40  Hycodan cough syrup 5 mL twice daily as needed for cough #120 ml  Stay on Symbicort

## 2017-09-07 NOTE — Progress Notes (Signed)
   Subjective:    Patient ID: James [name], male    DOB: 1956/01/23, 62 y.o.   MRN: [id number]  HPI  68 yowm For follow-up of chronic asthmatic bronchitis   He quit smoking 03/2015  Turns out he has chronic volume loss in his left lung on imaging, ? Congenital He underwent C-spine surgery 05/2016 and was noted to have moderate aspiration risk and a swallow study 01/2017  PMhx >> HLD, Anxiety/depression, Spinal stenosis, CAD, IDDM with retinopathy on insulin pump, CKD   He reports feeling under the weather for 2 weeks, due to change in weather, he may have had a head cold at onset, he then had a choking episode one night which she is able to pinpoint and then the next Turner he was wheezing and short of breath with paroxysms of cough which has persisted now for the last few days.  Albuterol MDI provide some relief and he has had to use his nebs.  He has been compliant with Symbicort which she was able to obtain from the company directly.  He had some leftover amoxicillin prescribed for to take which he took and the sputum turned from green to clear  He remains on an insulin pump and his HbA1c has been running low says that he is able to self adjust insulin with steroids  Significant tests/ events  Spirometry 2012 >> FEV1 1.94 (52%) PFTs 05/2015 ratio 74, FEV1 72%, no BD response, mild restriction  CT sinus 08/2013 neg  Ct chest 01/2014 - left volume loss ? congenital  Review of Systems neg for any significant sore throat, dysphagia, itching, sneezing, nasal congestion or excess/ purulent secretions, fever, chills, sweats, unintended wt loss, pleuritic or exertional cp, hempoptysis, orthopnea pnd or change in chronic leg swelling. Also denies presyncope, palpitations, heartburn, abdominal pain, nausea, vomiting, diarrhea or change in bowel or urinary habits, dysuria,hematuria, rash, arthralgias, visual complaints, headache, numbness weakness or ataxia.     Objective:   Physical  Exam  Gen. Pleasant, well-nourished, in no distress ENT - no thrush, no post nasal drip Neck: No JVD, no thyromegaly, no carotid bruits, no acc muscle use Lungs: no use of accessory muscles, no dullness to percussion, BL diffuse  rhonchi  Cardiovascular: Rhythm regular, heart sounds  normal, no murmurs or gallops, no peripheral edema Musculoskeletal: No deformities, no cyanosis or clubbing        Assessment & Plan:

## 2017-09-07 NOTE — Assessment & Plan Note (Signed)
Chest x-ray today.-Based on this we will decide about antibiotic.

## 2017-09-07 NOTE — Assessment & Plan Note (Signed)
Treat as acute flare, may have been triggered by head cold or aspiration episode  Solu-Medrol 80 mg IM x1  Prednisone 10 mg tabs Take 4 tabs  daily with food x 4 days, then 3 tabs daily x 4 days, then 2 tabs daily x 4 days, then 1 tab daily x4 days then stop. #40  Hycodan cough syrup 5 mL twice daily as needed for cough #120 ml  Stay on [employer]

## 2017-09-07 NOTE — Assessment & Plan Note (Signed)
He will adjust his insulin should the steroids cause hyperglycemia

## 2017-09-08 ENCOUNTER — Telehealth: Payer: Self-pay | Admitting: Pulmonary Disease

## 2017-09-08 ENCOUNTER — Ambulatory Visit (INDEPENDENT_AMBULATORY_CARE_PROVIDER_SITE_OTHER): Payer: Commercial Managed Care - PPO | Admitting: Ophthalmology

## 2017-09-08 NOTE — Telephone Encounter (Signed)
lmtcb for pt's spouse. 2 phone notes opened today on this patient.

## 2017-09-08 NOTE — Progress Notes (Signed)
LMTCB on preferred phone number listed for patient. 

## 2017-09-08 NOTE — Telephone Encounter (Signed)
Pt wife Shauna Hugh is calling back [phone]    [name] said if she doesnt answer leave a detailed message.

## 2017-09-08 NOTE — Telephone Encounter (Signed)
Spoke with pt's wife. She is aware of the pt's CXR results. Per RA, the pt doesn't need any additional antibiotics at this time. Nothing further was needed.

## 2017-09-08 NOTE — Telephone Encounter (Signed)
lmtcb x1 for pt. 

## 2017-09-09 ENCOUNTER — Telehealth: Payer: Self-pay | Admitting: Pulmonary Disease

## 2017-09-09 MED ORDER — CEFDINIR 300 MG PO CAPS: 300.0000 mg | ORAL_CAPSULE | Freq: Two times a day (BID) | ORAL | 0 refills | Status: DC

## 2017-09-09 MED FILL — CEFDINIR 300 MG CAPS: 300 | 7 days supply | Qty: 14 | Fill #0

## 2017-09-09 NOTE — Telephone Encounter (Signed)
He was given 40 mg of prednisone and should still be at this dose- Okay to take 40 mg twice daily for 2 days, then taper as ordered before  Please also call in Omnicef 300 mg twice daily x 7 days

## 2017-09-09 NOTE — Telephone Encounter (Signed)
Pt was here 09/07/17 for an OV with Dr. Elsworth Soho. Pt states his symptoms have worsened since that visit.  He is having worsening SOB and is having to stop every 50 feet to stop to catch his breath, coughing with greenish tint to it yesterday, 09/08/17 when he last checked it, and is also wheezing.  Pt is wanting a Rx of strong strength of prednisone to see if he can get symptoms under control.  Dr. Elsworth Soho, please advise on recommendations for pt.  Thanks!

## 2017-09-09 NOTE — Telephone Encounter (Signed)
Spoke with [name] since I could not reach the patient. She stated that she is pretty sure that the patient is still taking the prednisone. When he gets sick like this, it normally takes prednisone and abx to get rid of the sickness.   Advised her that RA would like for him to take [place]. She verbalized understanding. RX will be called into [facility]. Nothing else needed at ttime of call.

## 2017-09-09 NOTE — Telephone Encounter (Signed)
I spoke with the pt's wife yesterday. She was made aware of this pt's CXR results. Nothing further was needed.

## 2017-09-15 ENCOUNTER — Telehealth: Payer: Self-pay | Admitting: Pulmonary Disease

## 2017-09-15 MED ORDER — PREDNISONE 10 MG PO TABS: ORAL_TABLET | ORAL | 0 refills | Status: DC

## 2017-09-15 NOTE — Telephone Encounter (Signed)
callled and spoke with pt and he stated that he has 1 capsule left of the omnicef that he will take today.  He stated that he is still coughing with the foamy white sputum that he can get up and sometimes this is a little yellow but he is having trouble getting this stuff out of his airways.  He still has the nasal congestion that is also clear, wheezing and SOB and this is worse when he has the coughing spells. When these come, he normally has to sit down and rest since he is lightheaded when these spells happen. Pt wanted to see what RA recs are.  Please advise. Thanks  , Allergies  Allergen Reactions  . Augmentin [Amoxicillin-Pot Clavulanate] Nausea And Vomiting and Other (See Comments)    Has patient had a PCN reaction causing immediate rash, facial/tongue/throat swelling, SOB or lightheadedness with hypotension:  No  Has patient had a PCN reaction causing severe rash involving mucus membranes or skin necrosis:  No Has patient had a PCN reaction that required hospitalization No Has patient had a PCN reaction occurring within the last 10 years: No If all of the above answers are "NO", then may proceed with Cephalosporin use.  . Iohexol Hives and Other (See Comments)    Reaction:  Dizziness/sweating    . Ivp Dye [Iodinated Diagnostic Agents] Hives and Other (See Comments)    Dizziness/sweating ALSO  . Lexapro [Escitalopram Oxalate] Itching

## 2017-09-15 NOTE — Telephone Encounter (Signed)
Okay to refill Hycodan cough syrup x1. He does not feel like he needs more antibiotics since chest x-ray was clear Can give him more prednisone if wheezing persists Ideally should get another office visit if no better

## 2017-09-15 NOTE — Telephone Encounter (Signed)
RA, are you aware that the patient received a Hycodan RX from you on 2/20 for #189mL? Are you still ok with another refill so soon?

## 2017-09-16 NOTE — Telephone Encounter (Signed)
ATC pt, no answer. Left message for pt to call back.  

## 2017-09-16 NOTE — Telephone Encounter (Signed)
Spoke with patient-he is aware that he is not able to have refill of Hycodan until March 4,2019 as he should have enough left until then. Also, patient was offered another follow up OV since he is not feeling better.  Pt declined stating he would call back Monday if apt is needed. Nothing more needed at this time.

## 2017-09-16 NOTE — Telephone Encounter (Signed)
Patient in the lobby wanting rx -pr

## 2017-09-16 NOTE — Telephone Encounter (Signed)
5 ml bid should last 12 days  - let him know that we can refill after 3/4

## 2017-09-28 ENCOUNTER — Encounter (INDEPENDENT_AMBULATORY_CARE_PROVIDER_SITE_OTHER): Payer: Commercial Managed Care - PPO | Admitting: Ophthalmology

## 2017-10-10 ENCOUNTER — Other Ambulatory Visit: Payer: Self-pay | Admitting: Pulmonary Disease

## 2017-10-10 MED FILL — SYMBICORT 160-4.5 MCG INH: 160-4.5 | 30 days supply | Qty: 10 | Fill #0

## 2017-10-10 MED FILL — CHANTIX 1 MG CONT MONTH BOX: 1 | 28 days supply | Qty: 56 | Fill #1

## 2017-11-03 DIAGNOSIS — E1065 Type 1 diabetes mellitus with hyperglycemia: Secondary | ICD-10-CM | POA: Diagnosis not present

## 2017-11-04 DIAGNOSIS — E1065 Type 1 diabetes mellitus with hyperglycemia: Secondary | ICD-10-CM | POA: Diagnosis not present

## 2017-11-18 DIAGNOSIS — Z4681 Encounter for fitting and adjustment of insulin pump: Secondary | ICD-10-CM | POA: Diagnosis not present

## 2017-11-18 DIAGNOSIS — E1065 Type 1 diabetes mellitus with hyperglycemia: Secondary | ICD-10-CM | POA: Diagnosis not present

## 2017-11-18 DIAGNOSIS — N08 Glomerular disorders in diseases classified elsewhere: Secondary | ICD-10-CM | POA: Diagnosis not present

## 2017-11-18 DIAGNOSIS — E7849 Other hyperlipidemia: Secondary | ICD-10-CM | POA: Diagnosis not present

## 2017-11-18 DIAGNOSIS — Z1389 Encounter for screening for other disorder: Secondary | ICD-10-CM | POA: Diagnosis not present

## 2017-11-18 DIAGNOSIS — Z6826 Body mass index (BMI) 26.0-26.9, adult: Secondary | ICD-10-CM | POA: Diagnosis not present

## 2017-11-18 DIAGNOSIS — I2510 Atherosclerotic heart disease of native coronary artery without angina pectoris: Secondary | ICD-10-CM | POA: Diagnosis not present

## 2017-11-18 DIAGNOSIS — N401 Enlarged prostate with lower urinary tract symptoms: Secondary | ICD-10-CM | POA: Diagnosis not present

## 2017-11-18 DIAGNOSIS — E1029 Type 1 diabetes mellitus with other diabetic kidney complication: Secondary | ICD-10-CM | POA: Diagnosis not present

## 2017-11-18 DIAGNOSIS — D649 Anemia, unspecified: Secondary | ICD-10-CM | POA: Diagnosis not present

## 2017-11-18 DIAGNOSIS — Z794 Long term (current) use of insulin: Secondary | ICD-10-CM | POA: Diagnosis not present

## 2017-11-30 MED FILL — traZODone HCL 150 MG TABS: 150 | 90 days supply | Qty: 90 | Fill #2

## 2017-11-30 MED FILL — SIMVASTATIN 40 MG TABLET: 40 | 90 days supply | Qty: 90 | Fill #0

## 2017-11-30 MED FILL — CHANTIX 1 MG CONT MONTH BOX: 1 | 28 days supply | Qty: 56 | Fill #2

## 2017-12-05 ENCOUNTER — Ambulatory Visit (INDEPENDENT_AMBULATORY_CARE_PROVIDER_SITE_OTHER): Payer: 59 | Admitting: Pulmonary Disease

## 2017-12-05 ENCOUNTER — Other Ambulatory Visit (INDEPENDENT_AMBULATORY_CARE_PROVIDER_SITE_OTHER): Payer: 59

## 2017-12-05 ENCOUNTER — Telehealth: Payer: Self-pay | Admitting: Pulmonary Disease

## 2017-12-05 ENCOUNTER — Encounter: Payer: Self-pay | Admitting: Pulmonary Disease

## 2017-12-05 ENCOUNTER — Ambulatory Visit (INDEPENDENT_AMBULATORY_CARE_PROVIDER_SITE_OTHER)
Admission: RE | Admit: 2017-12-05 | Discharge: 2017-12-05 | Disposition: A | Discharge status: Home / Self Care | Payer: 59 | Source: Ambulatory Visit | Attending: Pulmonary Disease | Admitting: Pulmonary Disease

## 2017-12-05 VITALS — BP 118/68 | HR 97 | Temp 99.1°F | Ht 69.0 in | Wt 174.0 lb

## 2017-12-05 DIAGNOSIS — J40 Bronchitis, not specified as acute or chronic: Secondary | ICD-10-CM

## 2017-12-05 DIAGNOSIS — R05 Cough: Secondary | ICD-10-CM | POA: Diagnosis not present

## 2017-12-05 LAB — COMPREHENSIVE METABOLIC PANEL
ALT: 15 U/L (ref 0–53)
AST: 11 U/L (ref 0–37)
Albumin: 4.2 g/dL (ref 3.5–5.2)
Alkaline Phosphatase: 82 U/L (ref 39–117)
BUN: 12 mg/dL (ref 6–23)
CALCIUM: 8.7 mg/dL (ref 8.4–10.5)
CHLORIDE: 102 meq/L (ref 96–112)
CO2: 25 meq/L (ref 19–32)
CREATININE: 0.91 mg/dL (ref 0.40–1.50)
GFR: 89.52 mL/min (ref >60.00)
Glucose, Bld: 234 mg/dL — ABNORMAL HIGH (ref 70–99)
Potassium: 4.1 mEq/L (ref 3.5–5.1)
Sodium: 135 mEq/L (ref 135–145)
Total Bilirubin: 0.9 mg/dL (ref 0.2–1.2)
Total Protein: 6.7 g/dL (ref 6.0–8.3)

## 2017-12-05 LAB — CBC WITH DIFFERENTIAL/PLATELET
BASOS PCT: 0.6 % (ref 0.0–3.0)
Basophils Absolute: 0 10*3/uL (ref 0.0–0.1)
Eosinophils Absolute: 0.1 10*3/uL (ref 0.0–0.7)
Eosinophils Relative: 1.5 % (ref 0.0–5.0)
HEMATOCRIT: 43.1 % (ref 39.0–52.0)
Hemoglobin: 15.3 g/dL (ref 13.0–17.0)
LYMPHS PCT: 12.4 % (ref 12.0–46.0)
Lymphs Abs: 0.9 10*3/uL (ref 0.7–4.0)
MCHC: 35.5 g/dL (ref 30.0–36.0)
MCV: 94.1 fl (ref 78.0–100.0)
Monocytes Absolute: 0.5 10*3/uL (ref 0.1–1.0)
Monocytes Relative: 7.4 % (ref 3.0–12.0)
NEUTROS ABS: 5.6 10*3/uL (ref 1.4–7.7)
Neutrophils Relative %: 78.1 % — ABNORMAL HIGH (ref 43.0–77.0)
PLATELETS: 234 10*3/uL (ref 150.0–400.0)
RBC: 4.58 Mil/uL (ref 4.22–5.81)
RDW: 12.6 % (ref 11.5–15.5)
WBC: 7.2 10*3/uL (ref 4.0–10.5)

## 2017-12-05 MED ORDER — DOXYCYCLINE HYCLATE 100 MG PO TABS: 100.0000 mg | ORAL_TABLET | Freq: Two times a day (BID) | ORAL | 0 refills | Status: DC

## 2017-12-05 MED ORDER — METHYLPREDNISOLONE ACETATE 80 MG/ML IJ SUSP
80.0000 mg | Freq: Once | INTRAMUSCULAR | Status: AC
Start: 2017-12-05 — End: 2017-12-05
  Administered 2017-12-05: 80 mg via INTRAMUSCULAR

## 2017-12-05 MED ORDER — HYDROCODONE-HOMATROPINE 5-1.5 MG/5ML PO SYRP: 5.0000 mL | ORAL_SOLUTION | Freq: Two times a day (BID) | ORAL | 0 refills | Status: DC | PRN

## 2017-12-05 MED ORDER — PREDNISONE 10 MG PO TABS: ORAL_TABLET | ORAL | 0 refills | Status: DC

## 2017-12-05 MED FILL — DOXYCYCLINE HYCLATE 100 MG: 100 | 7 days supply | Qty: 14 | Fill #0

## 2017-12-05 MED FILL — predniSONE 10 MG TABS: 10 | 16 days supply | Qty: 40 | Fill #0

## 2017-12-05 NOTE — Assessment & Plan Note (Signed)
Acute flare due to bronchitis ? Viral  Blood work and chest x-ray today. Solu-Medrol 80 mg IM x1 Prednisone 10 mg tabs Take 4 tabs  daily with food x 4 days, then 3 tabs daily x 4 days, then 2 tabs daily x 4 days, then 1 tab daily x4 days then stop. #40  Doxycycline 100 mg twice daily for 7 days. Hycodan cough syrup Call if no better

## 2017-12-05 NOTE — Patient Instructions (Signed)
Blood work and chest x-ray today. Solu-Medrol 80 mg IM x1 Prednisone 10 mg tabs Take 4 tabs  daily with food x 4 days, then 3 tabs daily x 4 days, then 2 tabs daily x 4 days, then 1 tab daily x4 days then stop. #40  Doxycycline 100 mg twice daily for 7 days. Call if no better

## 2017-12-05 NOTE — Progress Notes (Signed)
   Subjective:    Patient ID: James [name], male    DOB: [birth year], 62 y.o.   MRN: [id number]  HPI  38 yowm  With IDDM on insulin pump for follow-up of chronic asthmatic bronchitis   Hequit smoking 03/2015  Turns out he has chronic volume loss in his left lung on imaging, ? Congenital He underwent C-spine surgery 05/2016 and was noted to have moderate aspiration risk on a swallow study 01/2017  PMhx >> HLD, Anxiety/depression, Spinal stenosis, CAD, IDDM with retinopathy on insulin pump, CKD   Chief Complaint  Patient presents with  . Acute Visit    chest pain and tightness, cough with clear mucous since sunday. Body aches and chills x 2 days. Nausea and vomiting x1.    Acute office visit, started with low-grade temperature, body aches and chills, nausea, then progressed into coughing with clear mucus for 2 days and wheezing  Denies specific aspiration episodes. No sick contacts   Significant tests/ events  Spirometry 2012 >> FEV1 1.94 (52%) PFTs 05/2015 ratio 74, FEV1 72%, no BD response, mild restriction  CT sinus 08/2013 neg  Ct chest 01/2014 - left volume loss ? congenital  Review of Systems neg for any significant sore throat, dysphagia, itching, sneezing, nasal congestion or excess/ purulent secretions, fever, chills, sweats, unintended wt loss, pleuritic or exertional cp, hempoptysis, orthopnea pnd or change in chronic leg swelling.   Also denies presyncope, palpitations, heartburn, abdominal pain, nausea, vomiting, diarrhea or change in bowel or urinary habits, dysuria,hematuria, rash, arthralgias, visual complaints, headache, numbness weakness or ataxia.     Objective:   Physical Exam  Gen. Pleasant, well-nourished, in mild distress, sneezing & coughing ENT - no thrush, no post nasal drip, dry mucosa Neck: No JVD, no thyromegaly, no carotid bruits Lungs: no use of accessory muscles, no dullness to percussion, BL diffuse exp rhonchi  Cardiovascular: Rhythm  regular, heart sounds  normal, no murmurs or gallops, no peripheral edema Musculoskeletal: No deformities, no cyanosis or clubbing         Assessment & Plan:

## 2017-12-05 NOTE — Telephone Encounter (Signed)
Called and spoke to pt. Pt reports of chest congestion, vomiting, bilateral rib and chest discomfort, headache, chills, prod cough with clear mucus, increased sob & wheezing x1d. Pt states in the last 24hr he has uses ventolin 5-6 times with mild relief.  Pt feels that he has a fever, but has not measures temp. Pt has been scheduled for acute visit with RA 12/05/17 at 2:30p. Nothing further is needed.

## 2017-12-05 NOTE — Telephone Encounter (Signed)
Called by James [name] d/t continued SOB and chest pain that he relates to coughing. Patient seen in office today by Dr. Elsworth Soho - Diagnosis - Bronchitis. Rx Doxycycline and Prednisone taper. The patient has not picked up his prescriptions and his pharmacy is closed now. I offered to call new prescriptions to a pharmacy that is currently open and he declined this offer. He has decided to pick up the prescriptions in the morning. I instructed the patient that should has respiratory status get worse overnight, that he should be evaluated in the Emergency Department for possible hospital admission.

## 2017-12-05 NOTE — Assessment & Plan Note (Signed)
Ct symbicort bid

## 2017-12-06 MED FILL — HYDROCODONE-HOMATROPINE SYR: 5-1.5 | 12 days supply | Qty: 120 | Fill #0

## 2017-12-06 NOTE — Telephone Encounter (Signed)
Please follow-up on him to check if he is doing better

## 2017-12-06 NOTE — Progress Notes (Signed)
Was able to talk to patient regarding patient's results.  They verbalized an understanding of what was discussed. No further questions at this time. 

## 2017-12-06 NOTE — Telephone Encounter (Signed)
Spoke with pt, he states he is doing better. FYI Dr. Elsworth Soho

## 2017-12-09 MED FILL — SYMBICORT 160-4.5 MCG INH: 160-4.5 | 30 days supply | Qty: 10 | Fill #1

## 2017-12-09 MED FILL — CONTOUR NEXT STRIPS: 88 days supply | Qty: 700 | Fill #1

## 2017-12-13 ENCOUNTER — Telehealth: Payer: Self-pay | Admitting: Pulmonary Disease

## 2017-12-13 MED ORDER — HYDROCODONE-HOMATROPINE 5-1.5 MG/5ML PO SYRP: 5.0000 mL | ORAL_SOLUTION | Freq: Two times a day (BID) | ORAL | 0 refills | Status: DC | PRN

## 2017-12-13 NOTE — Telephone Encounter (Signed)
Thanks. . Will do. Please bring 5:14 PM to my office.   Dr. Brand Males, M.D., [facility].C.P Pulmonary and Critical Care Medicine Staff Physician, [place] Director - Interstitial Lung Disease  Program  Pulmonary Register at [place], [place], [address]  Pager: [phone], If no answer or between  15:00h - 7:00h: call [phone] Telephone: [phone]

## 2017-12-13 NOTE — Telephone Encounter (Signed)
ATC pt, no answer. Left message for pt to call back.  Dr. Elsworth Soho can we refill Hycodan cough medication?  Patient Instructions by Rigoberto Noel, MD at 12/05/2017 2:30 PM  Author: Rigoberto Noel, MD Author Type: Physician Filed: 12/05/2017 2:40 PM  Note Status: Signed Cosign: Cosign Not Required Encounter Date: 12/05/2017  Editor: Rigoberto Noel, MD (Physician)    Blood work and chest x-ray today. Solu-Medrol 80 mg IM x1 Prednisone 10 mg tabs Take 4 tabs  daily with food x 4 days, then 3 tabs daily x 4 days, then 2 tabs daily x 4 days, then 1 tab daily x4 days then stop. #40  Doxycycline 100 mg twice daily for 7 days. Call if no better

## 2017-12-13 NOTE — Telephone Encounter (Signed)
Refill x1 

## 2017-12-13 NOTE — Telephone Encounter (Signed)
Attempted to call patient, no answer, left message to call back.  Rx signed and ready for pickup. Placed up front in accordion folder.

## 2017-12-13 NOTE — Telephone Encounter (Signed)
Dr. Elsworth Soho is not here to sign rx this week. Sending to DOD- MR please advise if you're willing to sign hycodan rx in RA's absence.  Thanks!

## 2017-12-14 ENCOUNTER — Encounter (INDEPENDENT_AMBULATORY_CARE_PROVIDER_SITE_OTHER): Payer: 59 | Admitting: Ophthalmology

## 2017-12-14 ENCOUNTER — Other Ambulatory Visit: Payer: Self-pay | Admitting: Pulmonary Disease

## 2017-12-14 NOTE — Telephone Encounter (Signed)
Patient returned call, he was advised RX is here for pick up.  He states he will be by today to get this.  No call back is necessary.

## 2017-12-15 MED FILL — VENTOLIN HFA 90 MCG INHALER: 108 (90 BAS | 25 days supply | Qty: 18 | Fill #0

## 2017-12-16 MED FILL — HYDROCODONE-HOMATROPINE SYR: 5-1.5 | 12 days supply | Qty: 120 | Fill #0

## 2017-12-20 ENCOUNTER — Telehealth: Payer: Self-pay | Admitting: Pulmonary Disease

## 2017-12-20 MED ORDER — ALBUTEROL SULFATE (2.5 MG/3ML) 0.083% IN NEBU: 2.5000 mg | INHALATION_SOLUTION | Freq: Four times a day (QID) | RESPIRATORY_TRACT | 3 refills | Status: DC | PRN

## 2017-12-20 MED FILL — ALBUTEROL 0.083% INHAL SOLN: (2.5 MG/3ML | 15 days supply | Qty: 180 | Fill #0

## 2017-12-20 NOTE — Telephone Encounter (Signed)
Albuterol nebs every 6 hours as needed #60

## 2017-12-20 NOTE — Telephone Encounter (Signed)
[name] returned call.  James [name] was taking Albuterol nebs prescribed in 09/2014 by his PCP.  Patient just ran completely out this week and PCP would like RA to prescribe it to him now, since he is seeing him for pulmonary.   RA please advis

## 2017-12-20 NOTE — Telephone Encounter (Signed)
Rx sent in and wife notified. Nothing further is needed.

## 2017-12-20 NOTE — Telephone Encounter (Signed)
Returned call from [place], Patients wife.  Albuterol nebs not listed on current med list.  Called to clarify it was albuterol nebs requesting and not albuterol/ventolin inhaler. Left detailed message to call back to clarify.

## 2018-01-03 MED FILL — CHANTIX 1 MG CONT MONTH BOX: 1 | 28 days supply | Qty: 56 | Fill #3

## 2018-01-04 ENCOUNTER — Ambulatory Visit (HOSPITAL_COMMUNITY)
Admission: EM | Admit: 2018-01-04 | Discharge: 2018-01-04 | Disposition: A | Discharge status: Home / Self Care | Payer: 59 | Attending: Urgent Care | Admitting: Urgent Care

## 2018-01-04 ENCOUNTER — Encounter (HOSPITAL_COMMUNITY): Payer: Self-pay | Admitting: Emergency Medicine

## 2018-01-04 DIAGNOSIS — J0101 Acute recurrent maxillary sinusitis: Secondary | ICD-10-CM | POA: Diagnosis not present

## 2018-01-04 DIAGNOSIS — R51 Headache: Secondary | ICD-10-CM

## 2018-01-04 DIAGNOSIS — R05 Cough: Secondary | ICD-10-CM

## 2018-01-04 DIAGNOSIS — R059 Cough, unspecified: Secondary | ICD-10-CM

## 2018-01-04 DIAGNOSIS — R519 Headache, unspecified: Secondary | ICD-10-CM

## 2018-01-04 DIAGNOSIS — J3089 Other allergic rhinitis: Secondary | ICD-10-CM

## 2018-01-04 DIAGNOSIS — J3489 Other specified disorders of nose and nasal sinuses: Secondary | ICD-10-CM

## 2018-01-04 MED ORDER — AMOXICILLIN 500 MG PO CAPS: 500.0000 mg | ORAL_CAPSULE | Freq: Three times a day (TID) | ORAL | 0 refills | Status: DC

## 2018-01-04 MED FILL — AMOXICILLIN 500 MG CAPSULE: 500 | 10 days supply | Qty: 30 | Fill #0

## 2018-01-04 NOTE — ED Provider Notes (Signed)
MRN: [id number] DOB: June 15, 1956  Subjective:   James [name] is a 62 y.o. male presenting for 3 week history of headaches.  Reports that he has had long-standing difficulty with sinus infections due to his allergies and asthma.  He does have a pulmonologist at [facility] pulmonology and was seen for his similar symptoms and in May 2019.  He underwent a steroid injection, oral steroid course and doxycycline.  He reports that his lower respiratory symptoms improved but he has had persistent sinus congestion, sinus pain, sinus headaches.  He takes an antihistamine daily and uses his Symbicort daily.  Reports that he hydrates very well and has been using pseudoephedrine 2-3 times a [place].   No current facility-administered medications for this encounter.   Current Outpatient Medications:  .  acetaminophen (TYLENOL) 325 MG tablet, Take 650 mg by mouth every 6 (six) hours as needed for mild pain., Disp: , Rfl:  .  albuterol (PROVENTIL) (2.5 MG/3ML) 0.083% nebulizer solution, Take 3 mLs (2.5 mg total) by nebulization every 6 (six) hours as needed for wheezing or shortness of breath., Disp: 60 vial, Rfl: 3 .  budesonide-formoterol (SYMBICORT) 160-4.5 MCG/ACT inhaler, Inhale 2 puffs into the lungs 2 (two) times daily., Disp: , Rfl:  .  cefdinir (OMNICEF) 300 MG capsule, Take 1 capsule (300 mg total) by mouth 2 (two) times daily. (Patient not taking: Reported on 12/05/2017), Disp: 14 capsule, Rfl: 0 .  doxycycline (VIBRA-TABS) 100 MG tablet, Take 1 tablet (100 mg total) by mouth 2 (two) times daily., Disp: 14 tablet, Rfl: 0 .  gabapentin (NEURONTIN) 300 MG capsule, Take 300 mg by mouth daily., Disp: , Rfl: 0 .  HYDROcodone-homatropine (HYCODAN) 5-1.5 MG/5ML syrup, Take 5 mLs by mouth 2 (two) times daily as needed for cough., Disp: 120 mL, Rfl: 0 .  ibuprofen (ADVIL,MOTRIN) 200 MG tablet, Take 800 mg by mouth every 6 (six) hours as needed for mild pain., Disp: , Rfl:  .  Insulin Human (INSULIN PUMP) SOLN, Inject into  the skin continuous. Humalog, Disp: , Rfl:  .  naproxen sodium (ANAPROX) 220 MG tablet, Take 440 mg by mouth 2 (two) times daily as needed (pain)., Disp: , Rfl:  .  predniSONE (DELTASONE) 10 MG tablet, 4 tablets daily with food x4days, then 3 tablets dialy x4days, then 2 tablets daily x4days, 1 tabletx4 days then STOP, Disp: 40 tablet, Rfl: 0 .  simvastatin (ZOCOR) 40 MG tablet, Take 40 mg by mouth at bedtime. , Disp: , Rfl: 5 .  SYMBICORT 160-4.5 MCG/ACT inhaler, INHALE 2 PUFFS INTO THE LUNGS 2 (TWO) TIMES DAILY., Disp: 1 Inhaler, Rfl: 6 .  traZODone (DESYREL) 150 MG tablet, Take 150 mg by mouth at bedtime., Disp: , Rfl: 5 .  varenicline (CHANTIX) 1 MG tablet, Take 1 mg by mouth 2 (two) times daily., Disp: , Rfl:  .  VENTOLIN HFA 108 (90 Base) MCG/ACT inhaler, INHALE 2 PUFFS BY MOUTH INTO THE LUNGS EVERY 6 HOURS AS NEEDED FOR WHEEZING OR SHORTNESS OF BREATH., Disp: 18 g, Rfl: 2   Allergies  Allergen Reactions  . Augmentin [Amoxicillin-Pot Clavulanate] Nausea And Vomiting and Other (See Comments)    Has patient had a PCN reaction causing immediate rash, facial/tongue/throat swelling, SOB or lightheadedness with hypotension:  No  Has patient had a PCN reaction causing severe rash involving mucus membranes or skin necrosis:  No Has patient had a PCN reaction that required hospitalization No Has patient had a PCN reaction occurring within the last 10 years: No  If all of the above answers are "NO", then may proceed with Cephalosporin use.  . Iohexol Hives and Other (See Comments)    Reaction:  Dizziness/sweating    . Ivp Dye [Iodinated Diagnostic Agents] Hives and Other (See Comments)    Dizziness/sweating ALSO  . Lexapro [Escitalopram Oxalate] Itching    Past Medical History:  Diagnosis Date  . ANXIETY 04/03/2007  . Anxiety   . ASTHMA 09/06/2008  . Asthma   . ASTHMATIC BRONCHITIS, ACUTE 10/25/2008  . Bladder neck obstruction   . CARPAL TUNNEL SYNDROME, BILATERAL 07/31/2007   issues  resolved, no surgery  . Cervical disc disease   . Chronic bronchitis ([place])    "get it about q yr" (02/12/2014)  . COPD (chronic obstructive pulmonary disease) ([place])   . CORONARY ARTERY DISEASE 04/03/2007  . DEPRESSION 04/03/2007  . Depression   . DIABETES MELLITUS, TYPE I 04/03/2007  . Diabetic retinopathy associated with diabetes mellitus due to underlying condition ([place]) 04/03/2007  . DM W/EYE MANIFESTATIONS, TYPE I, UNCONTROLLED 04/04/2007  . 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 ED (erectile dysfunction)   . History of kidney stones   . HYPERLIPIDEMIA 04/04/2007  . Pneumonia    "several times and again today" (02/13/2104)  . Renal insufficiency   . Seizures ([place])    "insulin seizure from time to time; none in the last couple years" (02/12/2014)  . Spinal stenosis      Past Surgical History:  Procedure Laterality Date  . ANTERIOR CERVICAL DECOMP/DISCECTOMY FUSION  2000   "couple screws and a plate"  . ANTERIOR CERVICAL DECOMP/DISCECTOMY FUSION N/A 06/24/2016   Procedure: ANTERIOR CERVICAL DECOMPRESSION/DISCECTOMY FUSION CERVICAL FOUR - CERVICAL FIVE, CERVICAL FIVE - CERVICAL SIX; REMOVAL TETHER CERVICAL PLATE;  Surgeon: Jovita Gamma, MD;  Location: [place];  Service: Neurosurgery;  Laterality: N/A;  ANTERIOR CERVICAL DECOMPRESSION/DISCECTOMY FUSION CERVICAL FOUR - CERVICAL FIVE, CERVICAL FIVE - CERVICAL SIX; REMOVAL TETHER CERVICAL PLATE  . APPENDECTOMY    . BACK SURGERY    . CARDIAC CATHETERIZATION  1990's  . CATARACT EXTRACTION W/ INTRAOCULAR LENS  IMPLANT, BILATERAL Bilateral   . CYSTOSCOPY WITH RETROGRADE PYELOGRAM, URETEROSCOPY AND STENT PLACEMENT Bilateral 04/06/2013   Procedure: BILATERAL CYSTOSCOPY WITH RETROGRADE PYELOGRAMS, STENT PLACEMENTS AND LEFT URETEROSCOPY AND STONE REMOVAL;  Surgeon: Alexis Frock, MD;  Location: [facility];  Service: Urology;  Laterality: Bilateral;  . CYSTOSCOPY WITH STENT PLACEMENT Right 04/12/2013   Procedure: CYSTOSCOPY WITH STENT  PLACEMENT;  Surgeon: Alexis Frock, MD;  Location: [facility];  Service: Urology;  Laterality: Right;  . CYSTOSCOPY/RETROGRADE/URETEROSCOPY Bilateral 04/12/2013   Procedure: CYSTOSCOPY/RETROGRADE/URETEROSCOPY;  Surgeon: Alexis Frock, MD;  Location: [facility];  Service: Urology;  Laterality: Bilateral;  RIGHT RETROGRADE   . HOLMIUM LASER APPLICATION Left 6/60/6301   Procedure: HOLMIUM LASER APPLICATION;  Surgeon: Alexis Frock, MD;  Location: [facility];  Service: Urology;  Laterality: Left;  . LYMPH NODE DISSECTION  ~ 1960   groin  . stress cardiolite  09/06/2002  . TONSILLECTOMY    . VITRECTOMY Bilateral     Objective:   Vitals: BP (!) 157/95 (BP Location: Left Arm)   Pulse 88   Temp 98 F (36.7 C) (Oral)   Resp 18   SpO2 95%   BP Readings from Last 3 Encounters:  01/04/18 (!) 157/95  12/05/17 118/68  09/07/17 114/72    Physical Exam  Constitutional: He is oriented to person, place, and time. He appears well-developed and well-nourished.  HENT:  Right Ear: Tympanic membrane normal.  Left Ear: Tympanic membrane normal.  Nose: Mucosal edema, rhinorrhea and sinus tenderness (right maxillary by report only) present.  Mouth/Throat: No oropharyngeal exudate, posterior oropharyngeal edema, posterior oropharyngeal erythema or tonsillar abscesses.  Cardiovascular: Normal rate.  Pulmonary/Chest: Effort normal.  Neurological: He is alert and oriented to person, place, and time.   Assessment and Plan :   Acute recurrent maxillary sinusitis  Sinus pain  Sinus headache  Allergic rhinitis due to other allergic trigger, unspecified seasonality  Cough  Counseled patient that we should not use steroids again and he is in agreement.  Will use amoxicillin which she reports he can take as he does not have a true allergy just gets an upset stomach with it.  He plans on continuing his probiotic Activa.  Recommended he follow-up with his primary care doctor or pulmonologist, consider starting  Singulair.  Follow-up as needed.     Jaynee Eagles, [place] 01/04/18 1818

## 2018-01-04 NOTE — ED Triage Notes (Signed)
Pt here for HA x 3 weeks

## 2018-01-06 ENCOUNTER — Ambulatory Visit: Payer: 59 | Admitting: Adult Health

## 2018-01-06 ENCOUNTER — Ambulatory Visit (INDEPENDENT_AMBULATORY_CARE_PROVIDER_SITE_OTHER)
Admission: RE | Admit: 2018-01-06 | Discharge: 2018-01-06 | Disposition: A | Discharge status: Home / Self Care | Payer: 59 | Source: Ambulatory Visit | Attending: Adult Health | Admitting: Adult Health

## 2018-01-06 ENCOUNTER — Encounter: Payer: Self-pay | Admitting: Adult Health

## 2018-01-06 DIAGNOSIS — J449 Chronic obstructive pulmonary disease, unspecified: Secondary | ICD-10-CM

## 2018-01-06 DIAGNOSIS — J302 Other seasonal allergic rhinitis: Secondary | ICD-10-CM

## 2018-01-06 DIAGNOSIS — J45901 Unspecified asthma with (acute) exacerbation: Secondary | ICD-10-CM | POA: Diagnosis not present

## 2018-01-06 MED ORDER — MONTELUKAST SODIUM 10 MG PO TABS: 10.0000 mg | ORAL_TABLET | Freq: Every day | ORAL | 11 refills | Status: DC

## 2018-01-06 MED FILL — MONTELUKAST SOD 10 MG TAB: 10 | 30 days supply | Qty: 30 | Fill #0

## 2018-01-06 NOTE — Assessment & Plan Note (Signed)
Flare  Plan  Patient Instructions  Finish amoxicillin as directed Mucinex DM twice daily as needed for cough congestion Zyrtec 10mg  At bedtime as needed for drainage Begin Singulair 10 mg at bedtime Continue on Symbicort 2 puffs twice daily, rinse after use Chest x-ray today Follow-up Dr. Elsworth Soho in 3 months and as needed

## 2018-01-06 NOTE — Assessment & Plan Note (Signed)
Flare at last visit with questionable early pneumonia.  Check a chest x-ray today.  May add Mucinex as needed.  Continue on Zyrtec as needed.  Trial of Singulair to see if can prevent triggers.  Plan  Patient Instructions  Finish amoxicillin as directed Mucinex DM twice daily as needed for cough congestion Zyrtec 10mg  At bedtime as needed for drainage Begin Singulair 10 mg at bedtime Continue on Symbicort 2 puffs twice daily, rinse after use Chest x-ray today Follow-up Dr. Elsworth Soho in 3 months and as needed

## 2018-01-06 NOTE — Progress Notes (Signed)
@Patient  ID: James [name], male    DOB: 1955-12-26, 62 y.o.   MRN: [id number]  Chief Complaint  Patient presents with  . Follow-up    AB     Referring provider: [name], MD  HPI: 62 year old male former smoker followed for chronic asthmatic bronchitis. 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 Past medical history significant for insulin-dependent diabetes on insulin pump C-spine surgery 2017 with moderate aspiration risk on swallow study  Significant tests/ events  Spirometry 2012 >> FEV1 1.94 (52%) PFTs 05/2015 ratio 74, FEV1 72%, no BD response, mild restriction  CT sinus 08/2013 neg  Ct chest 01/2014 - left volume loss ? congenital  01/06/2018 Follow up ; AB  Patient presents for a follow-up.  Patient was seen 1 month ago with an asthmatic bronchitic exacerbation.  Chest x-ray showed increase left basilar atelectasis versus infiltrate . He was treated with doxycycline and a prednisone taper . Says he got better from this .   Patient was seen in the emergency room 2 days ago for acute sinusitis treated with amoxicillin. Started to have sinus sx few days ago. Amoxicillin is starting to help . No increased cough or wheezing . Continues on Symbicort Twice daily  .  Denies any fever chest pain orthopnea PND or leg splint.  Appetite is fair.  No nausea vomiting or diarrhea    Allergies  Allergen Reactions  . Augmentin [Amoxicillin-Pot Clavulanate] Nausea And Vomiting and Other (See Comments)    Has patient had a PCN reaction causing immediate rash, facial/tongue/throat swelling, SOB or lightheadedness with hypotension:  No  Has patient had a PCN reaction causing severe rash involving mucus membranes or skin necrosis:  No Has patient had a PCN reaction that required hospitalization No Has patient had a PCN reaction occurring within the last 10 years: No If all of the above answers are "NO", then may proceed with Cephalosporin use.  . Iohexol Hives and  Other (See Comments)    Reaction:  Dizziness/sweating    . Ivp Dye [Iodinated Diagnostic Agents] Hives and Other (See Comments)    Dizziness/sweating ALSO  . Lexapro [Escitalopram Oxalate] Itching    Immunization History  Administered Date(s) Administered  . Influenza Split 04/18/2017  . Influenza Whole 07/31/2007, 04/15/2009, 05/08/2010  . Influenza,inj,Quad PF,6+ Mos 05/19/2016  . Influenza-Unspecified 03/19/2015  . Pneumococcal Polysaccharide-23 04/19/2003  . Td 12/22/2000  . Tdap 08/19/2013    Past Medical History:  Diagnosis Date  . ANXIETY 04/03/2007  . Anxiety   . ASTHMA 09/06/2008  . Asthma   . ASTHMATIC BRONCHITIS, ACUTE 63/03/2009  . Bladder neck obstruction   . CARPAL TUNNEL SYNDROME, BILATERAL 07/31/2007   issues resolved, no surgery  . Cervical disc disease   . Chronic bronchitis ([place])    "get it about q yr" (02/12/2014)  . COPD (chronic obstructive pulmonary disease) ([place])   . CORONARY ARTERY DISEASE 04/03/2007  . DEPRESSION 04/03/2007  . Depression   . DIABETES MELLITUS, TYPE I 04/03/2007  . Diabetic retinopathy associated with diabetes mellitus due to underlying condition ([place]) 04/03/2007  . DM W/EYE MANIFESTATIONS, TYPE I, UNCONTROLLED 04/04/2007  . 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 ED (erectile dysfunction)   . History of kidney stones   . HYPERLIPIDEMIA 04/04/2007  . Pneumonia    "several times and again today" (02/13/2104)  . Renal insufficiency   . Seizures ([place])    "insulin seizure from time to time; none in the last couple years" (  02/12/2014)  . Spinal stenosis     Tobacco History: Social History   Tobacco Use  Smoking Status Former Smoker  . Packs/[place]: 0.00  . Types: Cigarettes  . Last attempt to quit: 02/09/2017  . Years since quitting: 0.9  Smokeless Tobacco Never Used  Tobacco Comment   resumed smoking in fall of 2017   Counseling given: Not Answered Comment: resumed smoking in fall of 2017   Outpatient Encounter  Medications as of 01/06/2018  Medication Sig  . acetaminophen (TYLENOL) 325 MG tablet Take 650 mg by mouth every 6 (six) hours as needed for mild pain.  [name] albuterol (PROVENTIL) (2.5 MG/3ML) 0.083% nebulizer solution Take 3 mLs (2.5 mg total) by nebulization every 6 (six) hours as needed for wheezing or shortness of breath.  [name] amoxicillin (AMOXIL) 500 MG capsule Take 1 capsule (500 mg total) by mouth 3 (three) times daily.  . budesonide-formoterol (SYMBICORT) 160-4.5 MCG/ACT inhaler Inhale 2 puffs into the lungs 2 (two) times daily.  [name] ibuprofen (ADVIL,MOTRIN) 200 MG tablet Take 800 mg by mouth every 6 (six) hours as needed for mild pain.  . Insulin Human (INSULIN PUMP) SOLN Inject into the skin continuous. Humalog  . naproxen sodium (ANAPROX) 220 MG tablet Take 440 mg by mouth 2 (two) times daily as needed (pain).  . simvastatin (ZOCOR) 40 MG tablet Take 40 mg by mouth at bedtime.   . SYMBICORT 160-4.5 MCG/ACT inhaler INHALE 2 PUFFS INTO THE LUNGS 2 (TWO) TIMES DAILY.  . traZODone (DESYREL) 150 MG tablet Take 150 mg by mouth at bedtime.  . varenicline (CHANTIX) 1 MG tablet Take 1 mg by mouth 2 (two) times daily.  . VENTOLIN HFA 108 (90 Base) MCG/ACT inhaler INHALE 2 PUFFS BY MOUTH INTO THE LUNGS EVERY 6 HOURS AS NEEDED FOR WHEEZING OR SHORTNESS OF BREATH.  . gabapentin (NEURONTIN) 300 MG capsule Take 300 mg by mouth daily.  . montelukast (SINGULAIR) 10 MG tablet Take 1 tablet (10 mg total) by mouth at bedtime.  . [DISCONTINUED] cefdinir (OMNICEF) 300 MG capsule Take 1 capsule (300 mg total) by mouth 2 (two) times daily. (Patient not taking: Reported on 12/05/2017)  . [DISCONTINUED] doxycycline (VIBRA-TABS) 100 MG tablet Take 1 tablet (100 mg total) by mouth 2 (two) times daily. (Patient not taking: Reported on 01/06/2018)  . [DISCONTINUED] HYDROcodone-homatropine (HYCODAN) 5-1.5 MG/5ML syrup Take 5 mLs by mouth 2 (two) times daily as needed for cough. (Patient not taking: Reported on 01/06/2018)  .  [DISCONTINUED] predniSONE (DELTASONE) 10 MG tablet 4 tablets daily with food x4days, then 3 tablets dialy x4days, then 2 tablets daily x4days, 1 tabletx4 days then STOP (Patient not taking: Reported on 01/06/2018)   No facility-administered encounter medications on file as of 01/06/2018.      Review of Systems  Constitutional:   No  weight loss, night sweats,  Fevers, chills, fatigue, or  lassitude.  HEENT:   No headaches,  Difficulty swallowing,  Tooth/dental problems, or  Sore throat,                No sneezing, itching, ear ache,  +nasal congestion, post nasal drip,   CV:  No chest pain,  Orthopnea, PND, swelling in lower extremities, anasarca, dizziness, palpitations, syncope.   GI  No heartburn, indigestion, abdominal pain, nausea, vomiting, diarrhea, change in bowel habits, loss of appetite, bloody stools.   Resp:   No excess mucus, no productive cough,  No non-productive cough,  No coughing up of blood.  No change in  color of mucus.  No wheezing.  No chest wall deformity  Skin: no rash or lesions.  GU: no dysuria, change in color of urine, no urgency or frequency.  No flank pain, no hematuria   MS:  No joint pain or swelling.  No decreased range of motion.  No back pain.    Physical Exam  BP 132/74 (BP Location: Left Arm, Cuff Size: Normal)   Pulse 87   Ht 5\' 9"  (1.753 m)   Wt 170 lb 6.4 oz (77.3 kg)   SpO2 96%   BMI 25.16 kg/m   GEN: A/Ox3; pleasant , NAD, well nourished    HEENT:  [place]/AT,  EACs-clear, TMs-wnl, NOSE-clear drainage , THROAT-clear, no lesions, no postnasal drip or exudate noted.   NECK:  Supple w/ fair ROM; no JVD; normal carotid impulses w/o bruits; no thyromegaly or nodules palpated; no lymphadenopathy.    RESP  Clear  P & A; w/o, wheezes/ rales/ or rhonchi. no accessory muscle use, no dullness to percussion  CARD:  RRR, no m/r/g, no peripheral edema, pulses intact, no cyanosis or clubbing.  GI:   Soft & nt; nml bowel sounds; no organomegaly or  masses detected.   Musco: Warm bil, no deformities or joint swelling noted.   Neuro: alert, no focal deficits noted.    Skin: Warm, no lesions or rashes    Lab Results:  CBC    Component Value Date/Time   WBC 7.2 12/05/2017 1504   RBC 4.58 12/05/2017 1504   HGB 15.3 12/05/2017 1504   HCT 43.1 12/05/2017 1504   PLT 234.0 12/05/2017 1504   MCV 94.1 12/05/2017 1504   MCH 32.7 04/10/2017 0440   MCHC 35.5 12/05/2017 1504   RDW 12.6 12/05/2017 1504   LYMPHSABS 0.9 12/05/2017 1504   MONOABS 0.5 12/05/2017 1504   EOSABS 0.1 12/05/2017 1504   BASOSABS 0.0 12/05/2017 1504    BMET    Component Value Date/Time   NA 135 12/05/2017 1504   K 4.1 12/05/2017 1504   CL 102 12/05/2017 1504   CO2 25 12/05/2017 1504   GLUCOSE 234 (H) 12/05/2017 1504   BUN 12 12/05/2017 1504   CREATININE 0.91 12/05/2017 1504   CALCIUM 8.7 12/05/2017 1504   GFRNONAA >60 04/10/2017 0440   GFRAA >60 04/10/2017 0440    BNP No results found for: BNP  ProBNP No results found for: PROBNP  Imaging: No results found.   Assessment & Plan:   Chronic asthmatic bronchitis ([place]) Flare at last visit with questionable early pneumonia.  Check a chest x-ray today.  May add Mucinex as needed.  Continue on Zyrtec as needed.  Trial of Singulair to see if can prevent triggers.  Plan  Patient Instructions  Finish amoxicillin as directed Mucinex DM twice daily as needed for cough congestion Zyrtec 10mg  At bedtime as needed for drainage Begin Singulair 10 mg at bedtime Continue on Symbicort 2 puffs twice daily, rinse after use Chest x-ray today Follow-up Dr. Elsworth Soho in 3 months and as needed      Allergic rhinitis Flare  Plan  Patient Instructions  Finish amoxicillin as directed Mucinex DM twice daily as needed for cough congestion Zyrtec 10mg  At bedtime as needed for drainage Begin Singulair 10 mg at bedtime Continue on Symbicort 2 puffs twice daily, rinse after use Chest x-ray today Follow-up Dr.  Elsworth Soho in 3 months and as needed         Rexene Edison, NP 01/06/2018

## 2018-01-06 NOTE — Patient Instructions (Signed)
Finish amoxicillin as directed Mucinex DM twice daily as needed for cough congestion Zyrtec 10mg  At bedtime as needed for drainage Begin Singulair 10 mg at bedtime Continue on Symbicort 2 puffs twice daily, rinse after use Chest x-ray today Follow-up Dr. Elsworth Soho in 3 months and as needed

## 2018-01-06 NOTE — Addendum Note (Signed)
Addended by: Parke Poisson E on: 01/06/2018 04:54 PM   Modules accepted: Orders

## 2018-01-09 NOTE — Progress Notes (Signed)
LMOMTCB x 1 

## 2018-01-11 NOTE — Progress Notes (Signed)
LMTCB

## 2018-01-12 ENCOUNTER — Telehealth: Payer: Self-pay | Admitting: Pulmonary Disease

## 2018-01-12 NOTE — Telephone Encounter (Signed)
Pt is returning call about results. CB is [phone]

## 2018-01-12 NOTE — Telephone Encounter (Signed)
Attempted to call patient today regarding results. I did not receive an answer at time of call. I have left a voicemail message for pt to return call. X1  

## 2018-01-13 NOTE — Telephone Encounter (Signed)
Notes recorded by Dolores Lory, RN on 01/12/2018 at 10:23 AM EDT Attempted to call patient, no answer, left message to call back. ------  Notes recorded by Rosana Berger, CMA on 01/11/2018 at 9:35 AM EDT LMTCB ------  Notes recorded by Rinaldo Ratel, CMA on 01/09/2018 at 5:07 PM EDT LMOM TCB x1. ------  Notes recorded by Melvenia Needles, NP on 01/08/2018 at 10:36 AM EDT No sign of PNA  Cont w/ ov recs Please contact office for sooner follow up if symptoms do not improve or worsen or seek emergency care     Left detailed message on VM as requested per patient regarding results.

## 2018-01-16 NOTE — Progress Notes (Signed)
Letter sent due to patient not receiving calls for lab work.

## 2018-01-18 ENCOUNTER — Telehealth: Payer: Self-pay | Admitting: Pulmonary Disease

## 2018-01-18 DIAGNOSIS — E1029 Type 1 diabetes mellitus with other diabetic kidney complication: Secondary | ICD-10-CM | POA: Diagnosis not present

## 2018-01-18 DIAGNOSIS — Z6825 Body mass index (BMI) 25.0-25.9, adult: Secondary | ICD-10-CM | POA: Diagnosis not present

## 2018-01-18 DIAGNOSIS — R51 Headache: Secondary | ICD-10-CM | POA: Diagnosis not present

## 2018-01-18 NOTE — Telephone Encounter (Signed)
  Left detailed message advising pt of results as he requested. Nothing further is needed.   Notes recorded by Melvenia Needles, NP on 01/08/2018 at 10:36 AM EDT No sign of PNA  Cont w/ ov recs Please contact office for sooner follow up if symptoms do not improve or worsen or seek emergency care

## 2018-02-09 DIAGNOSIS — E1065 Type 1 diabetes mellitus with hyperglycemia: Secondary | ICD-10-CM | POA: Diagnosis not present

## 2018-02-09 MED FILL — MONTELUKAST SOD 10 MG TAB: 10 | 30 days supply | Qty: 30 | Fill #1

## 2018-02-09 MED FILL — CHANTIX 1 MG CONT MONTH BOX: 1 | 28 days supply | Qty: 56 | Fill #4

## 2018-02-09 MED FILL — HumaLOG 100 UNIT/ML SOLN: 100 | 90 days supply | Qty: 90 | Fill #0

## 2018-02-09 MED FILL — predniSONE 20 MG TABS: 20 | 5 days supply | Qty: 8 | Fill #0

## 2018-03-13 MED FILL — SIMVASTATIN 40 MG TABS: 40 | 90 days supply | Qty: 30 | Fill #0

## 2018-03-13 MED FILL — MONTELUKAST SOD 10 MG TAB: 10 | 30 days supply | Qty: 30 | Fill #2

## 2018-03-13 MED FILL — SYMBICORT 160-4.5 MCG INH: 160-4.5 | 30 days supply | Qty: 10 | Fill #2

## 2018-03-22 DIAGNOSIS — E113599 Type 2 diabetes mellitus with proliferative diabetic retinopathy without macular edema, unspecified eye: Secondary | ICD-10-CM | POA: Diagnosis not present

## 2018-03-22 DIAGNOSIS — N08 Glomerular disorders in diseases classified elsewhere: Secondary | ICD-10-CM | POA: Diagnosis not present

## 2018-03-22 DIAGNOSIS — K3184 Gastroparesis: Secondary | ICD-10-CM | POA: Diagnosis not present

## 2018-03-22 DIAGNOSIS — D6489 Other specified anemias: Secondary | ICD-10-CM | POA: Diagnosis not present

## 2018-03-22 DIAGNOSIS — F3289 Other specified depressive episodes: Secondary | ICD-10-CM | POA: Diagnosis not present

## 2018-03-22 DIAGNOSIS — Z794 Long term (current) use of insulin: Secondary | ICD-10-CM | POA: Diagnosis not present

## 2018-03-22 DIAGNOSIS — J449 Chronic obstructive pulmonary disease, unspecified: Secondary | ICD-10-CM | POA: Diagnosis not present

## 2018-03-22 DIAGNOSIS — E1029 Type 1 diabetes mellitus with other diabetic kidney complication: Secondary | ICD-10-CM | POA: Diagnosis not present

## 2018-03-22 DIAGNOSIS — E7849 Other hyperlipidemia: Secondary | ICD-10-CM | POA: Diagnosis not present

## 2018-03-29 MED FILL — CONTOUR NEXT STRIPS: 88 days supply | Qty: 700 | Fill #2

## 2018-03-31 DIAGNOSIS — Z23 Encounter for immunization: Secondary | ICD-10-CM | POA: Diagnosis not present

## 2018-03-31 DIAGNOSIS — R51 Headache: Secondary | ICD-10-CM | POA: Diagnosis not present

## 2018-03-31 DIAGNOSIS — Z6825 Body mass index (BMI) 25.0-25.9, adult: Secondary | ICD-10-CM | POA: Diagnosis not present

## 2018-03-31 MED FILL — METHOCARBAMOL 500 MG TABLET: 500 | 10 days supply | Qty: 30 | Fill #0

## 2018-04-12 ENCOUNTER — Ambulatory Visit: Payer: 59 | Admitting: Pulmonary Disease

## 2018-04-17 MED FILL — traZODone HCL 150 MG TABS: 150 | 90 days supply | Qty: 90 | Fill #0

## 2018-04-17 MED FILL — SIMVASTATIN 40 MG TABS: 40 | 90 days supply | Qty: 90 | Fill #1

## 2018-04-17 MED FILL — MONTELUKAST SOD 10 MG TAB: 10 | 90 days supply | Qty: 90 | Fill #3

## 2018-04-17 MED FILL — CHANTIX 1 MG CONT MONTH BOX: 1 | 28 days supply | Qty: 56 | Fill #5

## 2018-04-18 ENCOUNTER — Encounter (INDEPENDENT_AMBULATORY_CARE_PROVIDER_SITE_OTHER): Payer: 59 | Admitting: Ophthalmology

## 2018-04-18 DIAGNOSIS — E103593 Type 1 diabetes mellitus with proliferative diabetic retinopathy without macular edema, bilateral: Secondary | ICD-10-CM | POA: Diagnosis not present

## 2018-04-18 DIAGNOSIS — E10319 Type 1 diabetes mellitus with unspecified diabetic retinopathy without macular edema: Secondary | ICD-10-CM

## 2018-05-01 DIAGNOSIS — E1065 Type 1 diabetes mellitus with hyperglycemia: Secondary | ICD-10-CM | POA: Diagnosis not present

## 2018-05-11 ENCOUNTER — Ambulatory Visit: Payer: 59 | Admitting: Pulmonary Disease

## 2018-05-26 MED FILL — SYMBICORT 160-4.5 MCG INH: 160-4.5 | 30 days supply | Qty: 10 | Fill #3

## 2018-06-13 MED FILL — HumaLOG 100 UNIT/ML SOLN: 100 | 90 days supply | Qty: 90 | Fill #1

## 2018-06-13 MED FILL — CONTOUR NEXT STRIPS: 88 days supply | Qty: 700 | Fill #3

## 2018-06-28 DIAGNOSIS — F418 Other specified anxiety disorders: Secondary | ICD-10-CM | POA: Diagnosis not present

## 2018-06-28 DIAGNOSIS — F3289 Other specified depressive episodes: Secondary | ICD-10-CM | POA: Diagnosis not present

## 2018-06-28 DIAGNOSIS — Z6825 Body mass index (BMI) 25.0-25.9, adult: Secondary | ICD-10-CM | POA: Diagnosis not present

## 2018-06-28 DIAGNOSIS — M25561 Pain in right knee: Secondary | ICD-10-CM | POA: Diagnosis not present

## 2018-06-28 MED FILL — ESCITALOPRAM 10 MG TABLET: 10 | 30 days supply | Qty: 30 | Fill #0

## 2018-07-03 MED FILL — AMOXICILLIN 500 MG CAPSULE: 500 | 7 days supply | Qty: 21 | Fill #0

## 2018-07-21 MED FILL — CHANTIX 1 MG CONT MONTH BOX: 1 | 28 days supply | Qty: 56 | Fill #0

## 2018-07-21 MED FILL — SYMBICORT 160-4.5 MCG INH: 160-4.5 | 30 days supply | Qty: 10 | Fill #4

## 2018-07-31 DIAGNOSIS — E1065 Type 1 diabetes mellitus with hyperglycemia: Secondary | ICD-10-CM | POA: Diagnosis not present

## 2018-08-10 MED FILL — SIMVASTATIN 40 MG TABLET: 40 | 90 days supply | Qty: 90 | Fill #2

## 2018-08-21 DIAGNOSIS — E7849 Other hyperlipidemia: Secondary | ICD-10-CM | POA: Diagnosis not present

## 2018-08-21 DIAGNOSIS — N08 Glomerular disorders in diseases classified elsewhere: Secondary | ICD-10-CM | POA: Diagnosis not present

## 2018-08-21 DIAGNOSIS — I2510 Atherosclerotic heart disease of native coronary artery without angina pectoris: Secondary | ICD-10-CM | POA: Diagnosis not present

## 2018-08-21 DIAGNOSIS — N401 Enlarged prostate with lower urinary tract symptoms: Secondary | ICD-10-CM | POA: Diagnosis not present

## 2018-08-21 DIAGNOSIS — E1029 Type 1 diabetes mellitus with other diabetic kidney complication: Secondary | ICD-10-CM | POA: Diagnosis not present

## 2018-08-21 DIAGNOSIS — Z794 Long term (current) use of insulin: Secondary | ICD-10-CM | POA: Diagnosis not present

## 2018-08-21 DIAGNOSIS — I739 Peripheral vascular disease, unspecified: Secondary | ICD-10-CM | POA: Diagnosis not present

## 2018-08-21 DIAGNOSIS — N200 Calculus of kidney: Secondary | ICD-10-CM | POA: Diagnosis not present

## 2018-08-21 DIAGNOSIS — F3289 Other specified depressive episodes: Secondary | ICD-10-CM | POA: Diagnosis not present

## 2018-08-25 MED FILL — MONTELUKAST SOD 10 MG TAB: 10 | 90 days supply | Qty: 90 | Fill #4

## 2018-08-25 MED FILL — CHANTIX 1 MG CONT MONTH BOX: 1 | 28 days supply | Qty: 56 | Fill #1

## 2018-08-29 ENCOUNTER — Telehealth: Payer: Self-pay | Admitting: Pulmonary Disease

## 2018-08-29 NOTE — Telephone Encounter (Signed)
Called and left message to call back.

## 2018-08-29 NOTE — Telephone Encounter (Signed)
Pt returning call CB# [phone]//kob

## 2018-08-29 NOTE — Telephone Encounter (Signed)
Patient has been scheduled to see RA tomorrow at 915am.

## 2018-08-29 NOTE — Telephone Encounter (Signed)
lmtcb for pt. We need to offer appt to pt since he has not been seen since 12/2017.

## 2018-08-30 ENCOUNTER — Encounter: Payer: Self-pay | Admitting: Pulmonary Disease

## 2018-08-30 ENCOUNTER — Ambulatory Visit: Payer: 59 | Admitting: Pulmonary Disease

## 2018-08-30 DIAGNOSIS — J45998 Other asthma: Secondary | ICD-10-CM | POA: Diagnosis not present

## 2018-08-30 DIAGNOSIS — J449 Chronic obstructive pulmonary disease, unspecified: Secondary | ICD-10-CM | POA: Diagnosis not present

## 2018-08-30 MED ORDER — BUDESONIDE-FORMOTEROL FUMARATE 160-4.5 MCG/ACT IN AERO
INHALATION_SPRAY | RESPIRATORY_TRACT | 6 refills | Status: DC
Start: 2018-08-30 — End: 2019-08-13

## 2018-08-30 MED ORDER — DOXYCYCLINE HYCLATE 100 MG PO TABS: 100.0000 mg | ORAL_TABLET | Freq: Two times a day (BID) | ORAL | 0 refills | Status: DC

## 2018-08-30 MED FILL — DOXYCYCLINE HYCLATE 100 MG: 100 | 7 days supply | Qty: 14 | Fill #0

## 2018-08-30 MED FILL — SYMBICORT 160-4.5 MCG INH: 160-4.5 | 30 days supply | Qty: 10 | Fill #0

## 2018-08-30 NOTE — Progress Notes (Signed)
   Subjective:    Patient ID: James [name], male    DOB: Apr 07, 1956, 63 y.o.   MRN: [id number]  HPI  63 yo With IDDM on insulin pump for follow-up of chronic asthmatic bronchitis   Hequit smoking 03/2015  He had volume loss in his left lung on imaging in 2015 which appeared improved on a follow-up CT in 2017 and may have been due to rib fractures He underwent C-spine surgery 05/2016 and was noted to have moderate aspiration risk on a swallow study 01/2017  PMhx >> HLD, Anxiety/depression, Spinal stenosis, CAD,IDDMwith retinopathy on insulin pump, CKD   Chief Complaint  Patient presents with  . Acute Visit    Symptoms started about 5 days ago. Chest congestion, coughing up green mucus. Denies any fevers. Increased fatigue.    He got sick about 5 days ago, started with URI symptoms, no fevers, sore throat that progressed to a chest cold, not bringing up green sputum unable to sleep at night, occasional wheezing.  Has been compliant with his Symbicort also use a nebulizer. Wants to catch it before it gets worse.  Sugars are controlled on insulin pump   Significant tests/ events reviewed  Spirometry 2012 >> FEV1 1.94 (52%) PFTs 05/2015 ratio 74, FEV1 72%, no BD response, mild restriction  CT sinus 08/2013 neg  Ct chest 01/2014 - left volume loss and consolidation left lower lobe-rib fractures CT chest 2017-improved aeration left lung,?  Mild bronchiectasis  Review of Systems neg for any significant sore throat, dysphagia, itching, sneezing, nasal congestion or excess/ purulent secretions, fever, chills, sweats, unintended wt loss, pleuritic or exertional cp, hempoptysis, orthopnea pnd or change in chronic leg swelling. Also denies presyncope, palpitations, heartburn, abdominal pain, nausea, vomiting, diarrhea or change in bowel or urinary habits, dysuria,hematuria, rash, arthralgias, visual complaints, headache, numbness weakness or ataxia.     Objective:   Physical Exam  Gen.  Pleasant, well-nourished, in no distress ENT - no thrush, no pallor/icterus,no post nasal drip Neck: No JVD, no thyromegaly, no carotid bruits Lungs: no use of accessory muscles, no dullness to percussion, clear without rales or rhonchi  Cardiovascular: Rhythm regular, heart sounds  normal, no murmurs or gallops, no peripheral edema Musculoskeletal: No deformities, no cyanosis or clubbing        Assessment & Plan:

## 2018-08-30 NOTE — Patient Instructions (Signed)
Doxycycline 100 mg daily for 7 days. Refills on Symbicort.  Call if wheezing or if worse.  Schedule spirometry pre-and post 1 month

## 2018-08-30 NOTE — Assessment & Plan Note (Addendum)
He is having an infective episode of bronchitis, no bronchospasm at present so does not need steroids Doxycycline 100 mg daily for 7 days.  Consider HRCT in the future for bronchiectasis given his repeated episodes of asthmatic bronchitis

## 2018-08-30 NOTE — Assessment & Plan Note (Signed)
Refills on Symbicort.  Call if wheezing or if worse.  Schedule spirometry pre-and post 1 month

## 2018-09-01 ENCOUNTER — Telehealth: Payer: Self-pay | Admitting: Pulmonary Disease

## 2018-09-01 NOTE — Telephone Encounter (Signed)
Called and spoke with patient, he stated that he was seen by Dr. Elsworth Soho on 08/30/18 for chest congestion, nasal congestion, and productive cough with green mucus. (see phone note 08/29/18) instructions from OV with Dr. Elsworth Soho from 08/30/18 are listed below     Return in about 6 months (around 02/28/2019) for TP.  Doxycycline 100 mg daily for 7 days. Refills on Symbicort.  Call if wheezing or if worse.  Schedule spirometry pre-and post 1 month     _____________________________________________________________________________________  Patient stated that his congestion is a little better but not subsided, his cough however has gotten worse, he is now coughing up brownish tinged mucus, and he is coughing to the point where he has to excuse himself from the room at work. Patient is requesting medication that was prescribed to him in the past to help with his cough. Patient denies fever, he has no chills, no body aches. In the past the patient has been on both tessalon perles and hycodan.   Aaron Edelman please advise, thank you.

## 2018-09-01 NOTE — Telephone Encounter (Signed)
Called patient, unable to reach on number provided. Received automated voice message. Left message to give [place] a call back.

## 2018-09-01 NOTE — Telephone Encounter (Signed)
Called and spoke with patient, advised patient of response recorded by Aaron Edelman below. Patient stated that he cannot use mint products anyway due to him being type 1 diabetic. Patient stated that he doesn't consume much candy. Patient was upset and stated that even though he has taken Tessalon in the past, it does not take the cough completely away for him. Patient also stated that he would like for someone to "thoroughly" look through this chart to see what is best for him and his cough. Apologized to patient and stated that I could send a message back to our APP of the afternoon as Dr. Elsworth Soho is not in the office. Patient stated that he would take recommendations given but would like something else. Patient verbalized understanding and stated to me he had no further questions or concerns. Will route this back to [place] for further recommendations.

## 2018-09-01 NOTE — Telephone Encounter (Signed)
Started here the patient is not feeling well.  He is only started antibiotic therapy 2 days ago.  Is not unusual that his sputum color is still discolored as he still has not completed a course of antibiotics.Can offer the patient:Cough Home Instructions:  Goal is to not Cough or clear throat.  Avoid coughing or clearing throat by using:  non-mint products/sugarless candy Water ice chips Remember NO MINT PRODUCTS  Medications to use:  Mucinex DM 1-2 every 12 hrs or Delsym 2 tsp every 12 hrs for cough (These are Over the counter) Tessalon Three times a [place]  As needed  Cough.  >>>200 mg dose   Please place the order for Tessalon 200mg  q8 hours  prn for cough #30, 0 refill   If pt is not improving he will need to be seen for [place] FNP

## 2018-09-01 NOTE — Telephone Encounter (Signed)
lmtcb x1 for pt. 

## 2018-09-01 NOTE — Telephone Encounter (Signed)
Is the patient using any over-the-counter cough medicines?  Patient's persistent complaint is a cough that is worsened at work I do not believe that a sedative cough syrup would be appropriate in addressing that.  As I do not recommend during the [place] use of Hycodan cough syrup.  If the patient disagrees with that we can route this message to Dr. Elsworth Soho and he can address with the patient when he is back in office.   If the patient is having worsening cough at night then we could consider a sedative cough syrup but I do not believe that is appropriate to be using during the [place].  Patient currently refusing Ladona Ridgel as he reports that those do not work for him.  I am sorry the patient is frustrated that we did not know what has not worked for him in the past.  The patient needs to understand that we are managing his triage calls the best to his ability.  If he does not like our recommendations when managing his triage calls we can route to his primary pulmonologist is familiar with him and it can be addressed whenever the primary pulmonologist is in office.  As always the patient is more than welcome to have an office visit to further evaluate his cough.  Wyn Quaker FNP

## 2018-09-01 NOTE — Telephone Encounter (Signed)
Patient calling states saw Dr. Elsworth Soho on Wednesday and his condition has progressed to a dry cough and is disrupting him at work and would like cough syrup called into his [place] or he can come pick up a hand written Rx if needed. He would like whatever has been used in past . Looks like hycodan.

## 2018-09-07 DIAGNOSIS — Z794 Long term (current) use of insulin: Secondary | ICD-10-CM | POA: Diagnosis not present

## 2018-09-07 DIAGNOSIS — N08 Glomerular disorders in diseases classified elsewhere: Secondary | ICD-10-CM | POA: Diagnosis not present

## 2018-09-07 DIAGNOSIS — Z4681 Encounter for fitting and adjustment of insulin pump: Secondary | ICD-10-CM | POA: Diagnosis not present

## 2018-09-07 DIAGNOSIS — E1029 Type 1 diabetes mellitus with other diabetic kidney complication: Secondary | ICD-10-CM | POA: Diagnosis not present

## 2018-09-07 NOTE — Telephone Encounter (Signed)
Called and spoke with Patient.  He stated that his cough has improved greatly, and does not feel that he will need any further cough medications. Nothing further needed at this time.

## 2018-09-18 ENCOUNTER — Ambulatory Visit (INDEPENDENT_AMBULATORY_CARE_PROVIDER_SITE_OTHER): Payer: 59 | Admitting: Pulmonary Disease

## 2018-09-18 DIAGNOSIS — J449 Chronic obstructive pulmonary disease, unspecified: Secondary | ICD-10-CM | POA: Diagnosis not present

## 2018-09-18 LAB — PULMONARY FUNCTION TEST
FEF 25-75 PRE: 1.91 L/s
FEF 25-75 Post: 2.56 L/sec
FEF2575-%Change-Post: 34 %
FEF2575-%PRED-PRE: 69 %
FEF2575-%Pred-Post: 93 %
FEV1-%Change-Post: 7 %
FEV1-%PRED-POST: 75 %
FEV1-%Pred-Pre: 70 %
FEV1-Post: 2.56 L
FEV1-Pre: 2.39 L
FEV1FVC-%CHANGE-POST: 1 %
FEV1FVC-%Pred-Pre: 100 %
FEV6-%Change-Post: 6 %
FEV6-%PRED-POST: 78 %
FEV6-%PRED-PRE: 73 %
FEV6-Post: 3.34 L
FEV6-Pre: 3.14 L
FEV6FVC-%CHANGE-POST: 0 %
FEV6FVC-%PRED-POST: 104 %
FEV6FVC-%Pred-Pre: 104 %
FVC-%CHANGE-POST: 5 %
FVC-%PRED-POST: 74 %
FVC-%Pred-Pre: 70 %
FVC-POST: 3.35 L
FVC-Pre: 3.17 L
POST FEV6/FVC RATIO: 100 %
PRE FEV1/FVC RATIO: 75 %
Post FEV1/FVC ratio: 76 %
Pre FEV6/FVC Ratio: 99 %

## 2018-09-18 NOTE — Progress Notes (Signed)
Spirometry pre and post done today. 

## 2018-09-19 NOTE — Progress Notes (Signed)
LMTCB

## 2018-09-20 ENCOUNTER — Telehealth: Payer: Self-pay | Admitting: Pulmonary Disease

## 2018-09-20 NOTE — Telephone Encounter (Addendum)
Notes recorded by Rigoberto Noel, MD on 09/18/2018 at 11:49 AM EST Stable lung function at 70%  Attempted to call pt but unable to reach. Left detailed message on pt's home voicemail as requested by pt with the results. Nothing further needed.

## 2018-10-04 DIAGNOSIS — E1065 Type 1 diabetes mellitus with hyperglycemia: Secondary | ICD-10-CM | POA: Diagnosis not present

## 2018-10-05 MED FILL — CHANTIX 1 MG CONT MONTH BOX: 1 | 28 days supply | Qty: 56 | Fill #2

## 2018-10-05 MED FILL — traZODone HCL 150 MG TABS: 150 | 90 days supply | Qty: 90 | Fill #1

## 2018-10-09 MED FILL — CONTOUR NEXT STRIPS: 25 days supply | Qty: 200 | Fill #0

## 2018-10-09 MED FILL — HumaLOG 100 UNIT/ML SOLN: 100 | 90 days supply | Qty: 90 | Fill #0

## 2018-10-11 MED FILL — ALBUTEROL SULFATE HFA 108 (: 108 (90 BAS | 25 days supply | Qty: 18 | Fill #1

## 2018-10-11 MED FILL — UNIFINE PENTIPS 8MM 31G: 31G X 8 MM | 90 days supply | Qty: 100 | Fill #0

## 2018-10-17 ENCOUNTER — Ambulatory Visit (INDEPENDENT_AMBULATORY_CARE_PROVIDER_SITE_OTHER): Payer: 59 | Admitting: Adult Health

## 2018-10-17 ENCOUNTER — Other Ambulatory Visit: Payer: Self-pay

## 2018-10-17 ENCOUNTER — Encounter: Payer: Self-pay | Admitting: Adult Health

## 2018-10-17 ENCOUNTER — Telehealth: Payer: Self-pay | Admitting: Pulmonary Disease

## 2018-10-17 DIAGNOSIS — T17900A Unspecified foreign body in respiratory tract, part unspecified causing asphyxiation, initial encounter: Secondary | ICD-10-CM

## 2018-10-17 DIAGNOSIS — J4541 Moderate persistent asthma with (acute) exacerbation: Secondary | ICD-10-CM

## 2018-10-17 NOTE — Progress Notes (Signed)
Virtual Visit via Telephone Note  I connected with James [name] on 10/17/18 at  1:30 PM EDT by telephone and verified that I am speaking with the correct person using two identifiers.   I discussed the limitations, risks, security and privacy concerns of performing an evaluation and management service by telephone and the availability of in person appointments. I also discussed with the patient that there may be a patient responsible charge related to this service. The patient expressed understanding and agreed to proceed.   History of Present Illness: 63 year old male former smoker followed for chronic asthmatic bronchitis. 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 Past medical history significant for insulin-dependent diabetes on insulin pump C-spine surgery 2017 with moderate aspiration risk on swallow study Depression/Anxiety , CAD , Retinopathy   Today Tele-visit is for an acute office visit. He complains of throat irritation , cough , dyspnea , sinus drainage. Says he  Choked on vitamin pill yesterday around 11 am . Says he had a severe coughing fit that lasted about 20-10min . Since then he is feeling better but still has scratchy throat, decreased dry cough and intermittent dyspnea.  Used Albuterol yesterday . Has not used albuterol today . No wheezing today and no dysphagia. No drooling. No fever or discolored mucus .  Has been drinking warm fluids seems to help.   He has chronic asthma, on Symbicort . Was doing well until yesterday .  No n/v.d.   Observations/Objective:  Spirometry 2012 >> FEV1 1.94 (52%) PFTs 05/2015 ratio 74, FEV1 72%, no BD response, mild restriction  CT sinus 08/2013 neg  Ct chest 01/2014 - left volume loss ? Congenital CT chest 2017-improved aeration left lung,?  Mild bronchiectasis   Assessment and Plan: Choking episode while taking pill in the setting of chronic dysphagia. Appears to be improving . Advised on voice rest. Cough control  . Use of warm liquids and frequent sips of water to soothe throat  Advised on proper swalloing .   Asthma - mild flare with upper airway irritation from choking -RAD episode Seems to be improving   Plan   Patient Instructions  Voice rest. Delsym 2 teaspoons twice daily for cough control as needed Luke warm liquids to help soothe the throat Frequent sips of water Continue on Symbicort 2 puffs twice daily, rinse after use Albuterol as needed for wheezing Aspiration precautions Follow-up in our office as planned and as needed Please contact office for sooner follow up if symptoms do not improve or worsen or seek emergency care       Follow Up Instructions: Follow up as planned and As needed   Please contact office for sooner follow up if symptoms do not improve or worsen or seek emergency care      I discussed the assessment and treatment plan with the patient. The patient was provided an opportunity to ask questions and all were answered. The patient agreed with the plan and demonstrated an understanding of the instructions.   The patient was advised to call back or seek an in-person evaluation if the symptoms worsen or if the condition fails to improve as anticipated.  I provided  23 minutes of non-face-to-face time during this encounter.  Patient was present at home and myself present at office for visit.   Rexene Edison, NP

## 2018-10-17 NOTE — Telephone Encounter (Signed)
Patient has been seen by TP before.  I would recommend the patient get scheduled for a tele-visit to further assess and address.  Wyn Quaker, FNP

## 2018-10-17 NOTE — Telephone Encounter (Signed)
Called and spoke with pt stating to him that Wyn Quaker, NP wants [place] to schedule him a televisit with TP for further evaluation. Pt expressed understanding. televisit has been scheduled for pt with TP at 1:30 today, 3/31. Nothing further needed.

## 2018-10-17 NOTE — Patient Instructions (Signed)
Voice rest. Delsym 2 teaspoons twice daily for cough control as needed Luke warm liquids to help soothe the throat Frequent sips of water Continue on Symbicort 2 puffs twice daily, rinse after use Albuterol as needed for wheezing Aspiration precautions Follow-up in our office as planned and as needed Please contact office for sooner follow up if symptoms do not improve or worsen or seek emergency care

## 2018-10-17 NOTE — Telephone Encounter (Signed)
Primary Pulmonologist: Elsworth Soho Last office visit and with whom: 08/30/2018 with [facility] What do we see them for (pulmonary problems): chronic asthmatic bronchitis Last OV assessment/plan: Instructions   Return in about 6 months (around 02/28/2019) for TP.  Doxycycline 100 mg daily for 7 days. Refills on Symbicort.  Call if wheezing or if worse.  Schedule spirometry pre-and post 1 month       Was appointment offered to patient (explain)?  Pt wants to know recs   Reason for call: Called and spoke with pt who stated he choked on vitamin pill yesterday and after the choking, he began to cough. Pt stated he coughed for about 38min afterwards and stated he has been very SOB due to the episode that happened.  Pt stated about 1-2 hours after the coughing spell, he began to have some wheezing and stated he is still SOB from it. Pt stated that at times he does have problems swallowing certain things (pills) after his neck surgery.  Pt states he was coughing up white phlegm when he had the coughing spell and is still occ coughing up some white phlegm.  Pt denies any fever and has not travelled anywhere and has not been around anyone who has been sick.  Aaron Edelman, please advise recs for pt. Thanks!

## 2018-10-18 ENCOUNTER — Ambulatory Visit (INDEPENDENT_AMBULATORY_CARE_PROVIDER_SITE_OTHER): Payer: 59 | Admitting: Adult Health

## 2018-10-18 ENCOUNTER — Other Ambulatory Visit: Payer: Self-pay

## 2018-10-18 ENCOUNTER — Telehealth: Payer: Self-pay | Admitting: Adult Health

## 2018-10-18 ENCOUNTER — Ambulatory Visit (INDEPENDENT_AMBULATORY_CARE_PROVIDER_SITE_OTHER): Payer: 59

## 2018-10-18 ENCOUNTER — Encounter: Payer: Self-pay | Admitting: Adult Health

## 2018-10-18 DIAGNOSIS — J45901 Unspecified asthma with (acute) exacerbation: Secondary | ICD-10-CM

## 2018-10-18 DIAGNOSIS — J449 Chronic obstructive pulmonary disease, unspecified: Secondary | ICD-10-CM

## 2018-10-18 DIAGNOSIS — R05 Cough: Secondary | ICD-10-CM | POA: Diagnosis not present

## 2018-10-18 MED ORDER — PREDNISONE 10 MG PO TABS: ORAL_TABLET | ORAL | 0 refills | Status: DC

## 2018-10-18 MED ORDER — HYDROCODONE-HOMATROPINE 5-1.5 MG/5ML PO SYRP: 5.0000 mL | ORAL_SOLUTION | Freq: Four times a day (QID) | ORAL | 0 refills | Status: DC | PRN

## 2018-10-18 MED ORDER — AZITHROMYCIN 250 MG PO TABS: ORAL_TABLET | ORAL | 0 refills | Status: AC

## 2018-10-18 MED FILL — AZITHROMYCIN 250 MG TABLET: 250 | 5 days supply | Qty: 6 | Fill #0

## 2018-10-18 MED FILL — HYDROCODONE-HOMATROPINE SOL: 5-1.5 | 6 days supply | Qty: 120 | Fill #0

## 2018-10-18 MED FILL — predniSONE 10 MG TABS: 10 | 4 days supply | Qty: 9 | Fill #0

## 2018-10-18 NOTE — Telephone Encounter (Signed)
Pt to come for ov , please place on my schedule for 4:00 office with stat Cxr

## 2018-10-18 NOTE — Telephone Encounter (Signed)
Called and spoke with patient, he wanted to know if we have gotten response back from TP. Advised patient that we are waiting on response and once received we will contact him.

## 2018-10-18 NOTE — Assessment & Plan Note (Signed)
Exacerbation - probable trigger with getting choked on multivitamin .  Does not appear to have aspirated this but has caused a COPD exacerbation  CXR shows no acute process, no evidence of aspiration PNA or volume loss/post obstructive process.  Exam shows no stridor or increased wob. O2 sats are 97%.  Will continue with OP treatment  Advised of red flags to report in of worsening breathing , fever, dysphagia , etc  Will given hycodan short term for acute episode advised of potential side effects of hycodan . Cautious use.   Plan  Patient Instructions  Zpack to have on hold if symptoms worsen with discolored mucus .  Prednisone 30mg  today ,then 20mg  daily for 3 days and stop .  Hycodan 1 tsp At bedtime  For severe cough , may make you sleepy .  Call if blood sugar >250.  Voice rest. Mucinex liquid  Twice daily  As needed  Cough /congestion  Delsym 2 teaspoons twice daily for cough control as needed Luke warm liquids to help soothe the throat Frequent sips of water  Continue on Symbicort 2 puffs twice daily, rinse after use Albuterol inhaler or Neb as needed for wheezing Aspiration precautions Follow-up in our office as planned and as needed Please contact office for sooner follow up if symptoms do not improve or worsen or seek emergency care

## 2018-10-18 NOTE — Telephone Encounter (Signed)
Pt is calling back & said that he wants Vida Roller only to call him back.

## 2018-10-18 NOTE — Telephone Encounter (Signed)
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 Delsym 2 teaspoons twice daily for cough control as needed Luke warm liquids to help soothe the throat Frequent sips of water Continue on Symbicort 2 puffs twice daily, rinse after use Albuterol as needed for wheezing Aspiration precautions Follow-up in our office as planned and as needed Please contact office for sooner follow up if symptoms do not improve or worsen or seek emergency care      Was appointment offered to patient (explain)?  Pt had appt yesterday over the phone with TP   Reason for call: Pt states that he had choked on a multivitamin on 10/16/2018 and discussed this in depth with TP yesterday on a televisit.  Pt stated that last night between 6-8pm he's breathing had worsen, increase SOB. Pt felt that his breathing felt restricted, the coughing increase; it was not productive. Pt states that during the time period of 6-8pm he used the ventolin inhaler 2 puffs two separate times during the two hours of breathing troubles.  Pt stated that he could hardly eat dinner at 8pm last night, they had lasagna and salad he could hardly get it down. The coughing and difficultly breathing had worsen more about 9pm. Pt stated that at 930pm he took old prescriptions that he had on hand of 30ml of hycodan that was dated 12/16/17 and one tab of 20mg  prednisone from 02/13/18, he felt some better. Then he took another dose of hycodan 65ml and 1 tab 20mg  of prednisone at 3am. Pt doesn't have anymore of the hycodan nor prednisone left. He is wanting recommendations on feeling better with his breathing. He doesn't want to have this spell again without a plan. He stated that he is doing TP recommendations from [place]. Nothing seems to be helping. He would like to know should he go to the ED or come in the office for a visit.  TP please advise. Thank you.

## 2018-10-18 NOTE — Telephone Encounter (Signed)
Per Tammy Parrett patient is on his way and on his schedule. Nothing further needed.

## 2018-10-18 NOTE — Telephone Encounter (Signed)
Called patient x2. Unable to reach [facility]

## 2018-10-18 NOTE — Progress Notes (Signed)
@Patient  ID: James [name], male    DOB: 26-Apr-1956, 63 y.o.   MRN: [id number]  Chief Complaint  Patient presents with  . Acute Visit    cough     Referring provider: [name], MD  HPI: 63 yo With IDDM on insulin pump followed for chronic asthmatic bronchitis   Hequit smoking 03/2015  He had volume loss in his left lung on imaging in 2015 which appeared improved on a follow-up CT in 2017 and may have been due to rib fractures He underwent C-spine surgery 05/2016 and was noted to have moderate aspiration riskona swallow study 01/2017  PMhx >> HLD, Anxiety/depression, Spinal stenosis, CAD,IDDMwith retinopathy on insulin pump, CKD  TEST/EVENTS :  Spirometry 2012 >> FEV1 1.94 (52%) PFTs 05/2015 ratio 74, FEV1 72%, no BD response, mild restriction  CT sinus 08/2013 neg  Ct chest 01/2014 - left volume loss and consolidation left lower lobe-rib fractures CT chest 2017-improved aeration left lung,?  Mild bronchiectasis  10/18/2018 Acute OV : Cough James [name]  Patient presents for an acute office visit.  Patient called our office yesterday for a tele-visit. He had an episode yesterday around 11 AM where he was swallowing a vitamin p.o. and become choked.  He said he had a severe coughing fit that lasted about 20 to 30 minutes afterwards.  He was starting to feel better with a scratchy throat and residual cough.  He was not having any vomiting or drooling.  He was recommended on voice rest cough control with Delsym.  And lukewarm liquids to help soothe the throat.  Patient says that last night he had another episode of severe coughing fit that lasted for couple hours.  He took some leftover prednisone and use his albuterol inhaler.  It took him a few hours to get better.  Patient says this morning was feeling better.  But then earlier today he had another episode where he started having coughing and wheezing. On arrival to office today , he is feeling much better. O2 sats are 97% on room  air.  Chest xray today shows no acute process, bronchitic changes. Coughing up thick white mucus. No fever. No recent travel .  Eating okay with no n/v.d.  Ate peanut butter sandwich today with no issues.  Drinking fluids well . No dysphagia .  Has IDDM - says BS have been good.  Requests hycodan cough syrup to help with severe coughing fits and help him rest . Says mucinex , delsym or tessalon does not work   Allergies  Allergen Reactions  . Augmentin [Amoxicillin-Pot Clavulanate] Nausea And Vomiting and Other (See Comments)    Has patient had a PCN reaction causing immediate rash, facial/tongue/throat swelling, SOB or lightheadedness with hypotension:  No  Has patient had a PCN reaction causing severe rash involving mucus membranes or skin necrosis:  No Has patient had a PCN reaction that required hospitalization No Has patient had a PCN reaction occurring within the last 10 years: No If all of the above answers are "NO", then may proceed with Cephalosporin use.  . Iohexol Hives and Other (See Comments)    Reaction:  Dizziness/sweating    . Ivp Dye [Iodinated Diagnostic Agents] Hives and Other (See Comments)    Dizziness/sweating ALSO  . Lexapro [Escitalopram Oxalate] Itching    Immunization History  Administered Date(s) Administered  . Influenza Split 04/18/2017  . Influenza Whole 07/31/2007, 04/15/2009, 05/08/2010  . Influenza,inj,Quad PF,6+ Mos 05/19/2016  . Influenza-Unspecified 03/19/2015  . Pneumococcal Polysaccharide-23  04/19/2003  . Td 12/22/2000  . Tdap 08/19/2013    Past Medical History:  Diagnosis Date  . ANXIETY 04/03/2007  . Anxiety   . ASTHMA 09/06/2008  . Asthma   . ASTHMATIC BRONCHITIS, ACUTE 10/25/2008  . Bladder neck obstruction   . CARPAL TUNNEL SYNDROME, BILATERAL 07/31/2007   issues resolved, no surgery  . Cervical disc disease   . Chronic bronchitis ([place])    "get it about q yr" (02/12/2014)  . COPD (chronic obstructive pulmonary disease) ([place])   .  CORONARY ARTERY DISEASE 04/03/2007  . DEPRESSION 04/03/2007  . Depression   . DIABETES MELLITUS, TYPE I 04/03/2007  . Diabetic retinopathy associated with diabetes mellitus due to underlying condition ([place]) 04/03/2007  . DM W/EYE MANIFESTATIONS, TYPE I, UNCONTROLLED 04/04/2007  . 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 ED (erectile dysfunction)   . History of kidney stones   . HYPERLIPIDEMIA 04/04/2007  . Pneumonia    "several times and again today" (02/13/2104)  . Renal insufficiency   . Seizures ([place])    "insulin seizure from time to time; none in the last couple years" (02/12/2014)  . Spinal stenosis     Tobacco History: Social History   Tobacco Use  Smoking Status Former Smoker  . Packs/[place]: 0.00  . Types: Cigarettes  . Last attempt to quit: 02/09/2017  . Years since quitting: 1.6  Smokeless Tobacco Never Used  Tobacco Comment   resumed smoking in fall of 2017   Counseling given: Not Answered Comment: resumed smoking in fall of 2017   Outpatient Medications Prior to Visit  Medication Sig Dispense Refill  . acetaminophen (TYLENOL) 325 MG tablet Take 650 mg by mouth every 6 (six) hours as needed for mild pain.    [name] albuterol (PROVENTIL) (2.5 MG/3ML) 0.083% nebulizer solution Take 3 mLs (2.5 mg total) by nebulization every 6 (six) hours as needed for wheezing or shortness of breath. 60 vial 3  . budesonide-formoterol (SYMBICORT) 160-4.5 MCG/ACT inhaler INHALE 2 PUFFS INTO THE LUNGS 2 (TWO) TIMES DAILY. 1 Inhaler 6  . ibuprofen (ADVIL,MOTRIN) 200 MG tablet Take 800 mg by mouth every 6 (six) hours as needed for mild pain.    . Insulin Human (INSULIN PUMP) SOLN Inject into the skin continuous. Humalog    . simvastatin (ZOCOR) 40 MG tablet Take 40 mg by mouth at bedtime.   5  . traZODone (DESYREL) 150 MG tablet Take 150 mg by mouth at bedtime.  5  . varenicline (CHANTIX) 1 MG tablet Take 1 mg by mouth 2 (two) times daily.    . VENTOLIN HFA 108 (90 Base) MCG/ACT inhaler  INHALE 2 PUFFS BY MOUTH INTO THE LUNGS EVERY 6 HOURS AS NEEDED FOR WHEEZING OR SHORTNESS OF BREATH. 18 g 2   No facility-administered medications prior to visit.      Review of Systems:   Constitutional:   No  weight loss, night sweats,  Fevers, chills,  +fatigue, or  lassitude.  HEENT:   No headaches,  Difficulty swallowing,  Tooth/dental problems, or  Sore throat,                No sneezing, itching, ear ache, nasal congestion, post nasal drip,   CV:  No chest pain,  Orthopnea, PND, swelling in lower extremities, anasarca, dizziness, palpitations, syncope.   GI  No heartburn, indigestion, abdominal pain, nausea, vomiting, diarrhea, change in bowel habits, loss of appetite, bloody stools.   Resp:   No chest wall deformity  Skin: no rash or lesions.  GU: no dysuria, change in color of urine, no urgency or frequency.  No flank pain, no hematuria   MS:  No joint pain or swelling.  No decreased range of motion.  No back pain.    Physical Exam  BP 112/68   Pulse 94   Temp 98.1 F (36.7 C) (Oral)   SpO2 97%   GEN: A/Ox3; pleasant , NAD, elderly ,frail male    HEENT:  [place]/AT,  EACs-clear, TMs-wnl, NOSE-clear, THROAT-clear, no lesions, no postnasal drip or exudate noted.   NECK:  Supple w/ fair ROM; no JVD; normal carotid impulses w/o bruits; no thyromegaly or nodules palpated; no lymphadenopathy.  NO stridor   RESP  Scattered rhonchi , few exp wheezes . no accessory muscle use, no dullness to percussion, speaks in full sentences, no increased wob.   CARD:  RRR, no m/r/g, no peripheral edema, pulses intact, no cyanosis or clubbing.  GI:   Soft & nt; nml bowel sounds; no organomegaly or masses detected.   Musco: Warm bil, no deformities or joint swelling noted.   Neuro: alert, no focal deficits noted.    Skin: Warm, no lesions or rashes    Lab Results:  CBC  BMET    Component Value Date/Time   NA 135 12/05/2017 1504   K 4.1 12/05/2017 1504   CL 102 12/05/2017  1504   CO2 25 12/05/2017 1504   GLUCOSE 234 (H) 12/05/2017 1504   BUN 12 12/05/2017 1504   CREATININE 0.91 12/05/2017 1504   CALCIUM 8.7 12/05/2017 1504   GFRNONAA >60 04/10/2017 0440   GFRAA >60 04/10/2017 0440    BNP No results found for: BNP  ProBNP No results found for: PROBNP  Imaging: Dg Chest 2 View  Result Date: 10/18/2018 CLINICAL DATA:  Cough and history of asthmatic bronchitis with recent choking incident EXAM: CHEST - 2 VIEW COMPARISON:  01/06/2018 FINDINGS: Cardiac shadows within normal limits. The lungs are well aerated bilaterally. Mild interstitial changes are seen slightly increased from the prior exam likely related to a degree of bronchitis. No focal confluent infiltrate is seen. No effusion is noted. No acute bony abnormality is seen. Changes of cervical fusion are noted. IMPRESSION: Mild increased interstitial markings likely related to a degree of bronchitis. No focal confluent infiltrate is seen. Electronically Signed   By: Inez Catalina M.D.   On: 10/18/2018 16:30   Reviewed indepenently myself.    PFT Results Latest Ref Rng & Units 09/18/2018 05/22/2015  FVC-Pre L 3.17 3.41  FVC-Predicted Pre % 70 74  FVC-Post L 3.35 3.28  FVC-Predicted Post % 74 71  Pre FEV1/FVC % % 75 74  Post FEV1/FCV % % 76 77  FEV1-Pre L 2.39 2.52  FEV1-Predicted Pre % 70 72  FEV1-Post L 2.56 2.53  DLCO UNC% % - 73  DLCO COR %Predicted % - 97  TLC L - 5.50  TLC % Predicted % - 80  RV % Predicted % - 113    Lab Results  Component Value Date   NITRICOXIDE 11 01/26/2017        Assessment & Plan:   Chronic asthmatic bronchitis (HCC) Exacerbation - probable trigger with getting choked on multivitamin .  Does not appear to have aspirated this but has caused a COPD exacerbation  CXR shows no acute process, no evidence of aspiration PNA or volume loss/post obstructive process.  Exam shows no stridor or increased wob. O2 sats are 97%.  Will continue  with OP treatment  Advised  of red flags to report in of worsening breathing , fever, dysphagia , etc  Will given hycodan short term for acute episode advised of potential side effects of hycodan . Cautious use.   Plan  Patient Instructions  Zpack to have on hold if symptoms worsen with discolored mucus .  Prednisone 30mg  today ,then 20mg  daily for 3 days and stop .  Hycodan 1 tsp At bedtime  For severe cough , may make you sleepy .  Call if blood sugar >250.  Voice rest. Mucinex liquid  Twice daily  As needed  Cough /congestion  Delsym 2 teaspoons twice daily for cough control as needed Luke warm liquids to help soothe the throat Frequent sips of water  Continue on Symbicort 2 puffs twice daily, rinse after use Albuterol inhaler or Neb as needed for wheezing Aspiration precautions Follow-up in our office as planned and as needed Please contact office for sooner follow up if symptoms do not improve or worsen or seek emergency care          Rexene Edison, NP 10/18/2018

## 2018-10-18 NOTE — Patient Instructions (Addendum)
Zpack to have on hold if symptoms worsen with discolored mucus .  Prednisone 30mg  today ,then 20mg  daily for 3 days and stop .  Hycodan 1 tsp At bedtime  For severe cough , may make you sleepy .  Call if blood sugar >250.  Voice rest. Mucinex liquid  Twice daily  As needed  Cough /congestion  Delsym 2 teaspoons twice daily for cough control as needed Luke warm liquids to help soothe the throat Frequent sips of water  Continue on Symbicort 2 puffs twice daily, rinse after use Albuterol inhaler or Neb as needed for wheezing Aspiration precautions Follow-up in 3 months with Dr. Elsworth Soho  And As needed   Please contact office for sooner follow up if symptoms do not improve or worsen or seek emergency care

## 2018-10-20 MED FILL — HUMALOG 100 UNITS/ML KWIKPE: 100 | 15 days supply | Qty: 15 | Fill #0

## 2018-10-20 MED FILL — BD UF INS SYR 1 ML 31GX5/16: 31G X 5/16" | 90 days supply | Qty: 200 | Fill #0

## 2018-10-23 ENCOUNTER — Telehealth: Payer: Self-pay | Admitting: Pulmonary Disease

## 2018-10-23 MED ORDER — PREDNISONE 10 MG PO TABS: 10.0000 mg | ORAL_TABLET | Freq: Every day | ORAL | 0 refills | Status: DC

## 2018-10-23 MED FILL — predniSONE 10 MG TABS: 10 | 6 days supply | Qty: 9 | Fill #0

## 2018-10-23 NOTE — Telephone Encounter (Signed)
LMOMTCB on home and cell 10:00 .

## 2018-10-23 NOTE — Telephone Encounter (Signed)
Primary Pulmonologist: Elsworth Soho Last office visit and with whom: 10/18/2018 with TP What do we see them for (pulmonary problems): chronic asthmatic bronchitis Last OV assessment/plan: Instructions  Return in about 3 months (around 01/17/2019) for Follow up with Dr. Elsworth Soho.  Zpack to have on hold if symptoms worsen with discolored mucus .  Prednisone 30mg  today ,then 20mg  daily for 3 days and stop .  Hycodan 1 tsp At bedtime  For severe cough , may make you sleepy .  Call if blood sugar >250.  Voice rest. Mucinex liquid  Twice daily  As needed  Cough /congestion  Delsym 2 teaspoons twice daily for cough control as needed Luke warm liquids to help soothe the throat Frequent sips of water  Continue on Symbicort 2 puffs twice daily, rinse after use Albuterol inhaler or Neb as needed for wheezing Aspiration precautions Follow-up in 3 months with Dr. Elsworth Soho  And As needed   Please contact office for sooner follow up if symptoms do not improve or worsen or seek emergency care        Was appointment offered to patient (explain)?  Pt is wanting something prescribed   Reason for call: Called and spoke with pt who states he is still having SOB, has a headache, pain in his back and ribs, has a sore throat in the mornings, has wheezing and rattling when he breathes, and is coughing with yellow, white-brown phlegm.  Asked pt if he began the abx that was prescribed by TP but pt stated he has not taken it yet.  Pt stated he did take the prednisone Rx and stated he finished it about 2 days ago.  Asked pt if symptoms are worse than they were at last visit with TP 4/1 and pt stated they are about the same but the things that are bothering him the most is the SOB and wheezing.  Pt has had to use his rescue inhaler about twice a [place] and has also been using neb sol as directed by TP and states he has been doing that about once daily.  Pt denies any fever.  Tammy, please advise recs for pt. Thanks!

## 2018-10-23 NOTE — Telephone Encounter (Signed)
RX has been called into pharmacy.

## 2018-10-23 NOTE — Telephone Encounter (Signed)
Pt is feeling some better. No further coughing fits or loosing breath . Still has wheezing and now coughing up thick dark white/yellow mucus  Has not started Zpack  Prednisone really helped., would like more of this .  No fever, Temp check today 98.1  Eating well. Trying to walk around .  No hemoptysis or edema.    A/P  Slow to resolve Asthmatic Bronchitis  Plan is to  Zpack take as directed. -  extend prednisone 20 mg daily for 3 days then 10 mg daily for 3 days and stop. Mucinex liquid twice daily as needed for cough and congestion Delsym 2 teaspoons twice daily for cough control Continue on Symbicort 2 puffs twice daily, rinse after use.  Albuterol inhaler or nebulizer as needed for wheezing.  Aspiration precautions.  Patient is to call if blood sugars are greater than 250.  Continue on his insulin as directed  Follow-up in 6 weeks with Dr. Elsworth Soho or Arlester Keehan NP  And As needed   Please contact office for sooner follow up if symptoms do not improve or worsen or seek emergency care

## 2018-10-24 ENCOUNTER — Telehealth: Payer: Self-pay | Admitting: Adult Health

## 2018-10-24 MED FILL — SYMBICORT 160-4.5 MCG INH: 160-4.5 | 30 days supply | Qty: 10 | Fill #1

## 2018-10-24 NOTE — Telephone Encounter (Signed)
Called patient and gave verbal instruction on how he is to take prednisone. Pt was upset b/c he thought he was getting another taper. Pt was also upset b/c he is out of prednisone and rx sent yesterday is being mailed via [facility]. I informed patient that we have no control over how the pharmacy dispense medication. Nothing further needed.

## 2018-10-26 ENCOUNTER — Other Ambulatory Visit: Payer: Self-pay | Admitting: Adult Health

## 2018-10-26 MED ORDER — HYDROCODONE-HOMATROPINE 5-1.5 MG/5ML PO SYRP: 5.0000 mL | ORAL_SOLUTION | Freq: Four times a day (QID) | ORAL | 0 refills | Status: DC | PRN

## 2018-10-26 NOTE — Telephone Encounter (Signed)
TP please advise on refill of Hydromet. Order has been pended.

## 2018-10-26 NOTE — Telephone Encounter (Signed)
Pt returned call & states he did not receive a 30 [place] supply.

## 2018-10-26 NOTE — Telephone Encounter (Signed)
Call made to [facility], as it appears the medication was sent to the pharmacy 10/18/2018. Per rep script has been filled and picked up on the on the 1st. Need to know if patient hs already used the supply sent. 17ml was sent in which is a 30 [place] supply.

## 2018-10-30 DIAGNOSIS — E1065 Type 1 diabetes mellitus with hyperglycemia: Secondary | ICD-10-CM | POA: Diagnosis not present

## 2018-11-13 MED FILL — CHANTIX 1 MG CONT MONTH BOX: 1 | 28 days supply | Qty: 56 | Fill #3

## 2018-11-13 MED FILL — SIMVASTATIN 40 MG TABLET: 40 | 60 days supply | Qty: 60 | Fill #3

## 2018-11-14 DIAGNOSIS — E1029 Type 1 diabetes mellitus with other diabetic kidney complication: Secondary | ICD-10-CM | POA: Diagnosis not present

## 2018-11-14 DIAGNOSIS — R202 Paresthesia of skin: Secondary | ICD-10-CM | POA: Diagnosis not present

## 2018-11-14 DIAGNOSIS — Z794 Long term (current) use of insulin: Secondary | ICD-10-CM | POA: Diagnosis not present

## 2018-11-14 DIAGNOSIS — M50122 Cervical disc disorder at C5-C6 level with radiculopathy: Secondary | ICD-10-CM | POA: Diagnosis not present

## 2018-11-14 MED FILL — predniSONE 10 MG TABS: 10 | 6 days supply | Qty: 21 | Fill #0

## 2018-11-21 MED FILL — CONTOUR NEXT STRIPS: 25 days supply | Qty: 200 | Fill #1

## 2018-12-07 DIAGNOSIS — E1029 Type 1 diabetes mellitus with other diabetic kidney complication: Secondary | ICD-10-CM | POA: Diagnosis not present

## 2018-12-07 DIAGNOSIS — Z794 Long term (current) use of insulin: Secondary | ICD-10-CM | POA: Diagnosis not present

## 2018-12-07 DIAGNOSIS — N08 Glomerular disorders in diseases classified elsewhere: Secondary | ICD-10-CM | POA: Diagnosis not present

## 2018-12-07 DIAGNOSIS — Z4681 Encounter for fitting and adjustment of insulin pump: Secondary | ICD-10-CM | POA: Diagnosis not present

## 2018-12-07 DIAGNOSIS — R202 Paresthesia of skin: Secondary | ICD-10-CM | POA: Diagnosis not present

## 2018-12-26 DIAGNOSIS — E1065 Type 1 diabetes mellitus with hyperglycemia: Secondary | ICD-10-CM | POA: Diagnosis not present

## 2018-12-27 MED FILL — CHANTIX 1 MG CONT MONTH BOX: 1 | 28 days supply | Qty: 56 | Fill #4

## 2018-12-27 MED FILL — SYMBICORT 160-4.5 MCG INH: 160-4.5 | 30 days supply | Qty: 10 | Fill #2

## 2019-01-05 DIAGNOSIS — N401 Enlarged prostate with lower urinary tract symptoms: Secondary | ICD-10-CM | POA: Diagnosis not present

## 2019-01-05 DIAGNOSIS — N08 Glomerular disorders in diseases classified elsewhere: Secondary | ICD-10-CM | POA: Diagnosis not present

## 2019-01-05 DIAGNOSIS — N200 Calculus of kidney: Secondary | ICD-10-CM | POA: Diagnosis not present

## 2019-01-05 DIAGNOSIS — E113593 Type 2 diabetes mellitus with proliferative diabetic retinopathy without macular edema, bilateral: Secondary | ICD-10-CM | POA: Diagnosis not present

## 2019-01-05 DIAGNOSIS — G5600 Carpal tunnel syndrome, unspecified upper limb: Secondary | ICD-10-CM | POA: Diagnosis not present

## 2019-01-05 DIAGNOSIS — I2510 Atherosclerotic heart disease of native coronary artery without angina pectoris: Secondary | ICD-10-CM | POA: Diagnosis not present

## 2019-01-05 DIAGNOSIS — E1029 Type 1 diabetes mellitus with other diabetic kidney complication: Secondary | ICD-10-CM | POA: Diagnosis not present

## 2019-01-05 DIAGNOSIS — D649 Anemia, unspecified: Secondary | ICD-10-CM | POA: Diagnosis not present

## 2019-01-05 DIAGNOSIS — J449 Chronic obstructive pulmonary disease, unspecified: Secondary | ICD-10-CM | POA: Diagnosis not present

## 2019-01-23 DIAGNOSIS — G5603 Carpal tunnel syndrome, bilateral upper limbs: Secondary | ICD-10-CM | POA: Diagnosis not present

## 2019-01-23 DIAGNOSIS — G5623 Lesion of ulnar nerve, bilateral upper limbs: Secondary | ICD-10-CM | POA: Diagnosis not present

## 2019-01-23 MED FILL — SIMVASTATIN 40 MG TABLET: 40 | 90 days supply | Qty: 90 | Fill #0

## 2019-01-26 DIAGNOSIS — E1065 Type 1 diabetes mellitus with hyperglycemia: Secondary | ICD-10-CM | POA: Diagnosis not present

## 2019-01-29 MED FILL — CONTOUR NEXT STRIPS: 25 days supply | Qty: 200 | Fill #2

## 2019-02-12 DIAGNOSIS — M79642 Pain in left hand: Secondary | ICD-10-CM | POA: Diagnosis not present

## 2019-02-12 DIAGNOSIS — M79641 Pain in right hand: Secondary | ICD-10-CM | POA: Diagnosis not present

## 2019-02-12 DIAGNOSIS — G5603 Carpal tunnel syndrome, bilateral upper limbs: Secondary | ICD-10-CM | POA: Diagnosis not present

## 2019-02-12 DIAGNOSIS — E1140 Type 2 diabetes mellitus with diabetic neuropathy, unspecified: Secondary | ICD-10-CM | POA: Diagnosis not present

## 2019-02-21 MED FILL — traZODone HCL 150 MG TABS: 150 | 90 days supply | Qty: 90 | Fill #2

## 2019-02-21 MED FILL — CONTOUR NEXT STRIPS: 25 days supply | Qty: 200 | Fill #3

## 2019-02-21 MED FILL — HUMALOG 100 UNITS/ML KWIKPE: 100 | 15 days supply | Qty: 15 | Fill #1

## 2019-02-23 MED FILL — CHANTIX 1 MG CONT MONTH BOX: 1 | 28 days supply | Qty: 56 | Fill #5

## 2019-03-01 ENCOUNTER — Ambulatory Visit: Payer: 59 | Admitting: Adult Health

## 2019-03-06 MED FILL — HumaLOG 100 UNIT/ML SOLN: 100 | 90 days supply | Qty: 90 | Fill #1

## 2019-03-07 MED FILL — METHOCARBAMOL 500 MG TABS: 500 | 30 days supply | Qty: 60 | Fill #0

## 2019-03-09 DIAGNOSIS — E7849 Other hyperlipidemia: Secondary | ICD-10-CM | POA: Diagnosis not present

## 2019-03-09 DIAGNOSIS — M545 Low back pain: Secondary | ICD-10-CM | POA: Diagnosis not present

## 2019-03-09 DIAGNOSIS — Z125 Encounter for screening for malignant neoplasm of prostate: Secondary | ICD-10-CM | POA: Diagnosis not present

## 2019-03-09 DIAGNOSIS — Z79899 Other long term (current) drug therapy: Secondary | ICD-10-CM | POA: Diagnosis not present

## 2019-03-09 DIAGNOSIS — M5136 Other intervertebral disc degeneration, lumbar region: Secondary | ICD-10-CM | POA: Diagnosis not present

## 2019-03-09 DIAGNOSIS — E1029 Type 1 diabetes mellitus with other diabetic kidney complication: Secondary | ICD-10-CM | POA: Diagnosis not present

## 2019-03-09 DIAGNOSIS — M4696 Unspecified inflammatory spondylopathy, lumbar region: Secondary | ICD-10-CM | POA: Diagnosis not present

## 2019-03-09 MED FILL — predniSONE 10 MG TABS: 10 | 12 days supply | Qty: 21 | Fill #0

## 2019-03-12 DIAGNOSIS — E1029 Type 1 diabetes mellitus with other diabetic kidney complication: Secondary | ICD-10-CM | POA: Diagnosis not present

## 2019-03-13 MED FILL — CYCLOBENZAPRINE HCL 5 MG TA: 5 | 30 days supply | Qty: 60 | Fill #0

## 2019-03-19 DIAGNOSIS — E1065 Type 1 diabetes mellitus with hyperglycemia: Secondary | ICD-10-CM | POA: Diagnosis not present

## 2019-03-24 DIAGNOSIS — M545 Low back pain: Secondary | ICD-10-CM | POA: Diagnosis not present

## 2019-03-27 DIAGNOSIS — M545 Low back pain: Secondary | ICD-10-CM | POA: Diagnosis not present

## 2019-03-27 MED FILL — OXYCODONE-ACETAMINOPHEN 5-3: 5-325 | 13 days supply | Qty: 40 | Fill #0

## 2019-04-03 ENCOUNTER — Ambulatory Visit: Payer: 59 | Admitting: Adult Health

## 2019-04-03 MED FILL — HYDROCODON-APAP 7.5-325: 7.5-325 | 14 days supply | Qty: 40 | Fill #0

## 2019-04-03 MED FILL — CONTOUR NEXT STRIPS: 25 days supply | Qty: 200 | Fill #4

## 2019-04-09 ENCOUNTER — Telehealth: Payer: Self-pay

## 2019-04-09 MED FILL — SYMBICORT 160-4.5 MCG INH: 160-4.5 | 30 days supply | Qty: 10 | Fill #3

## 2019-04-09 NOTE — Telephone Encounter (Signed)
Error.  Made appt for pt.

## 2019-04-11 ENCOUNTER — Encounter: Payer: Self-pay | Admitting: Adult Health

## 2019-04-11 ENCOUNTER — Ambulatory Visit (INDEPENDENT_AMBULATORY_CARE_PROVIDER_SITE_OTHER): Payer: 59 | Admitting: Adult Health

## 2019-04-11 ENCOUNTER — Other Ambulatory Visit: Payer: Self-pay

## 2019-04-11 VITALS — BP 122/74 | HR 79 | Temp 97.1°F | Ht 69.0 in | Wt 174.4 lb

## 2019-04-11 DIAGNOSIS — J45998 Other asthma: Secondary | ICD-10-CM

## 2019-04-11 DIAGNOSIS — Z23 Encounter for immunization: Secondary | ICD-10-CM | POA: Diagnosis not present

## 2019-04-11 MED FILL — CYCLOBENZAPRINE HCL 5 MG TA: 5 | 30 days supply | Qty: 60 | Fill #0

## 2019-04-11 NOTE — Progress Notes (Signed)
@Patient  ID: James [name], male    DOB: 14-Jul-1956, 63 y.o.   MRN: [id number]  Chief Complaint  Patient presents with  . Follow-up    Asthma     Referring provider: [name], MD  HPI: 68 yoWith IDDM on insulin pump followed for chronic asthmatic bronchitis   Hequit smoking 03/2015 He hadvolume loss in his left lung on imaging in 2015 which appeared improved on a follow-up CT in 2017 and may have been due to rib fractures He underwent C-spine surgery 05/2016 and was noted to have moderate aspiration riskona swallow study 01/2017  PMhx >> HLD, Anxiety/depression, Spinal stenosis, CAD,IDDMwith retinopathy on insulin pump, CKD  TEST  Spirometry 2012 >> FEV1 1.94 (52%) PFTs 05/2015 ratio 74, FEV1 72%, no BD response, mild restriction  CT sinus 08/2013 neg  Ct chest 01/2014 - left volume lossand consolidation left lower lobe-rib fractures CT chest 2017-improved aeration left lung,? Mild bronchiectasis  04/11/2019 Follow up : Asthma  Patient presents for a 45-month follow-up.  Patient has underlying chronic asthmatic bronchitis.  Says over the last several months he has been doing well with his breathing he said no flare of cough or wheezing.  He says he does get winded with activity but is been very sedentary.  He has been having a lot of back issues and is planning for a back surgery in the near future.  Patient is on Symbicort twice daily but admits that he is not been using this on a consistent basis.  He denies any increased albuterol use.   Allergies  Allergen Reactions  . Augmentin [Amoxicillin-Pot Clavulanate] Nausea And Vomiting and Other (See Comments)    Has patient had a PCN reaction causing immediate rash, facial/tongue/throat swelling, SOB or lightheadedness with hypotension:  No  Has patient had a PCN reaction causing severe rash involving mucus membranes or skin necrosis:  No Has patient had a PCN reaction that required hospitalization No Has patient had  a PCN reaction occurring within the last 10 years: No If all of the above answers are "NO", then may proceed with Cephalosporin use.  . Iohexol Hives and Other (See Comments)    Reaction:  Dizziness/sweating    . Ivp Dye [Iodinated Diagnostic Agents] Hives and Other (See Comments)    Dizziness/sweating ALSO  . Lexapro [Escitalopram Oxalate] Itching    Immunization History  Administered Date(s) Administered  . Influenza Split 04/18/2017, 03/28/2019  . Influenza Whole 07/31/2007, 04/15/2009, 05/08/2010  . Influenza,inj,Quad PF,6+ Mos 05/19/2016  . Influenza-Unspecified 03/19/2015  . Pneumococcal Polysaccharide-23 04/19/2003  . Td 12/22/2000  . Tdap 08/19/2013    Past Medical History:  Diagnosis Date  . ANXIETY 04/03/2007  . Anxiety   . ASTHMA 09/06/2008  . Asthma   . ASTHMATIC BRONCHITIS, ACUTE 10/25/2008  . Bladder neck obstruction   . CARPAL TUNNEL SYNDROME, BILATERAL 07/31/2007   issues resolved, no surgery  . Cervical disc disease   . Chronic bronchitis ([place])    "get it about q yr" (02/12/2014)  . COPD (chronic obstructive pulmonary disease) ([place])   . CORONARY ARTERY DISEASE 04/03/2007  . DEPRESSION 04/03/2007  . Depression   . DIABETES MELLITUS, TYPE I 04/03/2007  . Diabetic retinopathy associated with diabetes mellitus due to underlying condition ([place]) 04/03/2007  . DM W/EYE MANIFESTATIONS, TYPE I, UNCONTROLLED 04/04/2007  . 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 ED (erectile dysfunction)   . History of kidney stones   . HYPERLIPIDEMIA 04/04/2007  . Pneumonia    "  several times and again today" (02/13/2104)  . Renal insufficiency   . Seizures ([place])    "insulin seizure from time to time; none in the last couple years" (02/12/2014)  . Spinal stenosis     Tobacco History: Social History   Tobacco Use  Smoking Status Former Smoker  . Packs/[place]: 0.00  . Types: Cigarettes  . Quit date: 02/09/2017  . Years since quitting: 2.1  Smokeless Tobacco Never Used   Tobacco Comment   resumed smoking in fall of 2017   Counseling given: Not Answered Comment: resumed smoking in fall of 2017   Outpatient Medications Prior to Visit  Medication Sig Dispense Refill  . acetaminophen (TYLENOL) 325 MG tablet Take 650 mg by mouth every 6 (six) hours as needed for mild pain.    [name] albuterol (PROVENTIL) (2.5 MG/3ML) 0.083% nebulizer solution Take 3 mLs (2.5 mg total) by nebulization every 6 (six) hours as needed for wheezing or shortness of breath. 60 vial 3  . budesonide-formoterol (SYMBICORT) 160-4.5 MCG/ACT inhaler INHALE 2 PUFFS INTO THE LUNGS 2 (TWO) TIMES DAILY. 1 Inhaler 6  . cyclobenzaprine (FLEXERIL) 10 MG tablet Take 10 mg by mouth 3 (three) times daily as needed for muscle spasms.    [name] ibuprofen (ADVIL,MOTRIN) 200 MG tablet Take 800 mg by mouth every 6 (six) hours as needed for mild pain.    . Insulin Human (INSULIN PUMP) SOLN Inject into the skin continuous. Humalog    . oxyCODONE-acetaminophen (PERCOCET) 5-325 MG tablet Take 1 tablet by mouth every 8 (eight) hours as needed for severe pain.    . simvastatin (ZOCOR) 40 MG tablet Take 40 mg by mouth at bedtime.   5  . traZODone (DESYREL) 150 MG tablet Take 150 mg by mouth at bedtime.  5  . varenicline (CHANTIX) 1 MG tablet Take 1 mg by mouth 2 (two) times daily.    . VENTOLIN HFA 108 (90 Base) MCG/ACT inhaler INHALE 2 PUFFS BY MOUTH INTO THE LUNGS EVERY 6 HOURS AS NEEDED FOR WHEEZING OR SHORTNESS OF BREATH. 18 g 2  . HYDROcodone-homatropine (HYDROMET) 5-1.5 MG/5ML syrup Take 5 mLs by mouth every 6 (six) hours as needed. (Patient not taking: Reported on 04/11/2019) 120 mL 0  . predniSONE (DELTASONE) 10 MG tablet 3 tabs for 1 days  , 2 tabs for 3 days and stop (Patient not taking: Reported on 04/11/2019) 9 tablet 0  . predniSONE (DELTASONE) 10 MG tablet Take 1 tablet (10 mg total) by mouth daily with breakfast. Take 2 tablets daily for 3 days, then 1 tablet daily for 3 days then STOP. (Patient not taking:  Reported on 04/11/2019) 9 tablet 0   No facility-administered medications prior to visit.      Review of Systems:   Constitutional:   No  weight loss, night sweats,  Fevers, chills,  +fatigue, or  lassitude.  HEENT:   No headaches,  Difficulty swallowing,  Tooth/dental problems, or  Sore throat,                No sneezing, itching, ear ache, nasal congestion, post nasal drip,   CV:  No chest pain,  Orthopnea, PND, swelling in lower extremities, anasarca, dizziness, palpitations, syncope.   GI  No heartburn, indigestion, abdominal pain, nausea, vomiting, diarrhea, change in bowel habits, loss of appetite, bloody stools.   Resp:  No excess mucus, no productive cough,  No non-productive cough,  No coughing up of blood.  No change in color of mucus.  No wheezing.  No chest wall deformity  Skin: no rash or lesions.  GU: no dysuria, change in color of urine, no urgency or frequency.  No flank pain, no hematuria   MS: Back pain    Physical Exam  BP 122/74 (BP Location: Left Arm, Cuff Size: Normal)   Pulse 79   Temp (!) 97.1 F (36.2 C) (Temporal)   Ht 5\' 9"  (1.753 m)   Wt 174 lb 6.4 oz (79.1 kg)   SpO2 97%   BMI 25.75 kg/m   GEN: A/Ox3; pleasant , NAD,   HEENT:  [place]/AT,  NOSE-clear, THROAT-clear, no lesions, no postnasal drip or exudate noted.   NECK:  Supple w/ fair ROM; no JVD; normal carotid impulses w/o bruits; no thyromegaly or nodules palpated; no lymphadenopathy.    RESP  Clear  P & A; w/o, wheezes/ rales/ or rhonchi. no accessory muscle use, no dullness to percussion  CARD:  RRR, no m/r/g, no peripheral edema, pulses intact, no cyanosis or clubbing.  GI:   Soft & nt; nml bowel sounds; no organomegaly or masses detected.   Musco: Warm bil, no deformities or joint swelling noted.   Neuro: alert, no focal deficits noted.    Skin: Warm, no lesions or rashes    Lab Results:  CBC  BNP No results found for: BNP  ProBNP No results found for: PROBNP   Imaging: No results found.    PFT Results Latest Ref Rng & Units 09/18/2018 05/22/2015  FVC-Pre L 3.17 3.41  FVC-Predicted Pre % 70 74  FVC-Post L 3.35 3.28  FVC-Predicted Post % 74 71  Pre FEV1/FVC % % 75 74  Post FEV1/FCV % % 76 77  FEV1-Pre L 2.39 2.52  FEV1-Predicted Pre % 70 72  FEV1-Post L 2.56 2.53  DLCO UNC% % - 73  DLCO COR %Predicted % - 97  TLC L - 5.50  TLC % Predicted % - 80  RV % Predicted % - 113    Lab Results  Component Value Date   NITRICOXIDE 11 01/26/2017        Assessment & Plan:   No problem-specific Assessment & Plan notes found for this encounter.     Rexene Edison, NP 04/11/2019

## 2019-04-11 NOTE — Patient Instructions (Addendum)
Continue on Symbicort 2 puffs twice daily, rinse after use- do not skip .  Albuterol inhaler or Neb as needed for wheezing Pneumovax today  Activity as tolerated.  Follow-up in 3 -4 months with Dr. Elsworth Soho  And as needed   Please contact office for sooner follow up if symptoms do not improve or worsen or seek emergency care

## 2019-04-12 MED FILL — ONDANSETRON HCL 4 MG TABLET: 4 | 10 days supply | Qty: 30 | Fill #0

## 2019-04-12 MED FILL — OXYCODONE-ACETAMINOPHEN 5-3: 5-325 | 7 days supply | Qty: 28 | Fill #0

## 2019-04-12 NOTE — Assessment & Plan Note (Signed)
Mild to moderate persistent asthma-appears to be controlled.  Have encouraged patient on Symbicort compliance.  Patient is quite sedentary due to back issues.  Physical deconditioning is an ongoing problem.  Hopefully after back surgery will be able to do physical therapy and this will be improved.  Plan  Patient Instructions  Continue on Symbicort 2 puffs twice daily, rinse after use- do not skip .  Albuterol inhaler or Neb as needed for wheezing Pneumovax today  Activity as tolerated.  Follow-up in 3 -4 months with Dr. Elsworth Soho  And as needed   Please contact office for sooner follow up if symptoms do not improve or worsen or seek emergency care

## 2019-04-16 NOTE — Patient Instructions (Addendum)
DUE TO COVID-19 ONLY ONE VISITOR IS ALLOWED TO COME WITH YOU AND STAY IN THE WAITING ROOM ONLY DURING PRE OP AND PROCEDURE [place] OF SURGERY.  THE 1 VISITOR MAY VISIT WITH YOU AFTER SURGERY IN YOUR PRIVATE ROOM DURING VISITING HOURS ONLY!   ONCE YOUR COVID TEST IS COMPLETED, PLEASE BEGIN THE QUARANTINE INSTRUCTIONS AS OUTLINED IN YOUR HANDOUT.                James [name]   Your procedure is scheduled on: 04/20/19   Report to [facility] Main  Entrance   Report to admitting at   1100 AM     Call this number if you have problems the morning of surgery [phone]   . BRUSH YOUR TEETH MORNING OF SURGERY AND RINSE YOUR MOUTH OUT, NO CHEWING GUM CANDY OR MINTS.  Do not eat food After Midnight  . YOU MAY HAVE CLEAR LIQUIDS FROM MIDNIGHT UNTIL 10  AM.   At 10 AM Please finish the prescribed Pre-Surgery Gatorade drink  . Nothing by mouth after you finish the Gatorade drink !    Take these medicines the morning of surgery with A SIP OF WATER: Trazadone  DO NOT TAKE ANY DIABETIC MEDICATIONS [place] OF YOUR SURGERY    How to Manage Your Diabetes Before and After Surgery  Why is it important to control my blood sugar before and after surgery? . Improving blood sugar levels before and after surgery helps healing and can limit problems. . A way of improving blood sugar control is eating a healthy diet by: o  Eating less sugar and carbohydrates o  Increasing activity/exercise o  Talking with your doctor about reaching your blood sugar goals . High blood sugars (greater than 180 mg/dL) can raise your risk of infections and slow your recovery, so you will need to focus on controlling your diabetes during the weeks before surgery. . Make sure that the doctor who takes care of your diabetes knows about your planned surgery including the date and location.  How do I manage my blood sugar before surgery? . Check your blood sugar at least 4 times a [place], starting 2 days before surgery, to make  sure that the level is not too high or low. o Check your blood sugar the morning of your surgery when you wake up and every 2 hours until you get to the Short Stay unit. . If your blood sugar is less than 70 mg/dL, you will need to treat for low blood sugar: o Do not take insulin. o Treat a low blood sugar (less than 70 mg/dL) with  cup of clear juice (cranberry or apple), 4 glucose tablets, OR glucose gel. o Recheck blood sugar in 15 minutes after treatment (to make sure it is greater than 70 mg/dL). If your blood sugar is not greater than 70 mg/dL on recheck, call [phone] for further instructions. . Report your blood sugar to the short stay nurse when you get to Short Stay.  . If you are admitted to the hospital after surgery: o Your blood sugar will be checked by the staff and you will probably be given insulin after surgery (instead of oral diabetes medicines) to make sure you have good blood sugar levels. o The goal for blood sugar control after surgery is 80-180 mg/dL.   WHAT DO I DO ABOUT MY DIABETES MEDICATION?  [name] Do not take oral diabetes medicines (pills) the morning of surgery.  . THE NIGHT BEFORE SURGERY, take  units of  Humolog     insulin.       . THE MORNING OF SURGERY, take   units of         insulin.  . The [place] of surgery, do not take other diabetes injectables, including Byetta (exenatide), Bydureon (exenatide ER), Victoza (liraglutide), or Trulicity (dulaglutide).  . If your CBG is greater than 220 mg/dL, you may take  of your sliding scale  . (correction) dose of insulin.    For patients with insulin pumps: Contact your diabetes doctor for specific instructions before surgery. Decrease basal rates by 20% at midnight the night before your surgery. Note that if your surgery is planned to be longer than 2 hours, your insulin pump will be removed and intravenous (IV) insulin will be started and managed by the nurses and the anesthesiologist. You will be able to  restart your insulin pump once you are awake and able to manage it.  Make sure to bring insulin pump supplies to the hospital with you in case the  site needs to be changed.  Patient Signature:  Date:   Nurse Signature:  Date:   Reviewed and Endorsed by [facility] Patient Education Committee, August 2015                             You may not have any metal on your body including hair pins and              piercings  Do not wear jewelry, make-up, lotions, powders or perfumes, deodorant             Do not wear nail polish on your fingernails.  Do not shave  48 hours prior to surgery.              Men may shave face and neck.   Do not bring valuables to the hospital. [place].  Contacts, dentures or bridgework may not be worn into surgery.  Leave suitcase in the car. After surgery it may be brought to your room.     Patients discharged the [place] of surgery will not be allowed to drive home. IF YOU ARE HAVING SURGERY AND GOING HOME THE SAME [place], YOU MUST HAVE AN ADULT TO DRIVE YOU HOME AND BE WITH YOU FOR 24 HOURS. YOU MAY GO HOME BY TAXI OR UBER OR ORTHERWISE, BUT AN ADULT MUST ACCOMPANY YOU HOME AND STAY WITH YOU FOR 24 HOURS.  Name and phone number of your driver:  Special Instructions: N/A              Please read over the following fact sheets you were given: _____________________________________________________________________             [facility] - Preparing for Surgery Before surgery, you can play an important role.  Because skin is not sterile, your skin needs to be as free of germs as possible.  You can reduce the number of germs on your skin by washing with CHG (chlorahexidine gluconate) soap before surgery.  CHG is an antiseptic cleaner which kills germs and bonds with the skin to continue killing germs even after washing. Please DO NOT use if you have an allergy to CHG or antibacterial soaps.  If your skin becomes  reddened/irritated stop using the CHG and inform your nurse when you arrive  at Short Stay. Do not shave (including legs and underarms) for at least 48 hours prior to the first CHG shower.  You may shave your face/neck. Please follow these instructions carefully:  1.  Shower with CHG Soap the night before surgery and the  morning of Surgery.  2.  If you choose to wash your hair, wash your hair first as usual with your  normal  shampoo.  3.  After you shampoo, rinse your hair and body thoroughly to remove the  shampoo.                           4.  Use CHG as you would any other liquid soap.  You can apply chg directly  to the skin and wash                       Gently with a scrungie or clean washcloth.  5.  Apply the CHG Soap to your body ONLY FROM THE NECK DOWN.   Do not use on face/ open                           Wound or open sores. Avoid contact with eyes, ears mouth and genitals (private parts).                       Wash face,  Genitals (private parts) with your normal soap.             6.  Wash thoroughly, paying special attention to the area where your surgery  will be performed.  7.  Thoroughly rinse your body with warm water from the neck down.  8.  DO NOT shower/wash with your normal soap after using and rinsing off  the CHG Soap.                9.  Pat yourself dry with a clean towel.            10.  Wear clean pajamas.            11.  Place clean sheets on your bed the night of your first shower and do not  sleep with pets. [place] of Surgery : Do not apply any lotions/deodorants the morning of surgery.  Please wear clean clothes to the hospital/surgery center.  FAILURE TO FOLLOW THESE INSTRUCTIONS MAY RESULT IN THE CANCELLATION OF YOUR SURGERY PATIENT SIGNATURE_________________________________  NURSE SIGNATURE__________________________________  ________________________________________________________________________   James [name]  An incentive spirometer is a tool that  can help keep your lungs clear and active. This tool measures how well you are filling your lungs with each breath. Taking long deep breaths may help reverse or decrease the chance of developing breathing (pulmonary) problems (especially infection) following:  A long period of time when you are unable to move or be active. BEFORE THE PROCEDURE   If the spirometer includes an indicator to show your best effort, your nurse or respiratory therapist will set it to a desired goal.  If possible, sit up straight or lean slightly forward. Try not to slouch.  Hold the incentive spirometer in an upright position. INSTRUCTIONS FOR USE  1. Sit on the edge of your bed if possible, or sit up as far as you can in bed or on a chair. 2. Hold the incentive spirometer in an upright position. 3. Breathe out normally. 4. Place  the mouthpiece in your mouth and seal your lips tightly around it. 5. Breathe in slowly and as deeply as possible, raising the piston or the ball toward the top of the column. 6. Hold your breath for 3-5 seconds or for as long as possible. Allow the piston or ball to fall to the bottom of the column. 7. Remove the mouthpiece from your mouth and breathe out normally. 8. Rest for a few seconds and repeat Steps 1 through 7 at least 10 times every 1-2 hours when you are awake. Take your time and take a few normal breaths between deep breaths. 9. The spirometer may include an indicator to show your best effort. Use the indicator as a goal to work toward during each repetition. 10. After each set of 10 deep breaths, practice coughing to be sure your lungs are clear. If you have an incision (the cut made at the time of surgery), support your incision when coughing by placing a pillow or rolled up towels firmly against it. Once you are able to get out of bed, walk around indoors and cough well. You may stop using the incentive spirometer when instructed by your caregiver.  RISKS AND  COMPLICATIONS  Take your time so you do not get dizzy or light-headed.  If you are in pain, you may need to take or ask for pain medication before doing incentive spirometry. It is harder to take a deep breath if you are having pain. AFTER USE  Rest and breathe slowly and easily.  It can be helpful to keep track of a log of your progress. Your caregiver can provide you with a simple table to help with this. If you are using the spirometer at home, follow these instructions: [place] IF:   You are having difficultly using the spirometer.  You have trouble using the spirometer as often as instructed.  Your pain medication is not giving enough relief while using the spirometer.  You develop fever of 100.5 F (38.1 C) or higher. SEEK IMMEDIATE MEDICAL CARE IF:   You cough up bloody sputum that had not been present before.  You develop fever of 102 F (38.9 C) or greater.  You develop worsening pain at or near the incision site. MAKE SURE YOU:   Understand these instructions.  Will watch your condition.  Will get help right away if you are not doing well or get worse. Document Released: 11/15/2006 Document Revised: 09/27/2011 Document Reviewed: 01/16/2007 [facility] Patient Information 2014 [place], [place].   ________________________________________________________________________

## 2019-04-17 ENCOUNTER — Encounter (HOSPITAL_COMMUNITY)
Admission: RE | Admit: 2019-04-17 | Discharge: 2019-04-17 | Disposition: A | Discharge status: Home / Self Care | Payer: 59 | Source: Ambulatory Visit | Attending: Orthopedic Surgery | Admitting: Orthopedic Surgery

## 2019-04-17 ENCOUNTER — Encounter (HOSPITAL_COMMUNITY): Payer: Self-pay

## 2019-04-17 ENCOUNTER — Other Ambulatory Visit: Payer: Self-pay

## 2019-04-17 ENCOUNTER — Other Ambulatory Visit (HOSPITAL_COMMUNITY)
Admission: RE | Admit: 2019-04-17 | Discharge: 2019-04-17 | Disposition: A | Discharge status: Home / Self Care | Payer: 59 | Source: Ambulatory Visit | Attending: Orthopedic Surgery | Admitting: Orthopedic Surgery

## 2019-04-17 DIAGNOSIS — I2510 Atherosclerotic heart disease of native coronary artery without angina pectoris: Secondary | ICD-10-CM | POA: Diagnosis not present

## 2019-04-17 DIAGNOSIS — J449 Chronic obstructive pulmonary disease, unspecified: Secondary | ICD-10-CM | POA: Diagnosis not present

## 2019-04-17 DIAGNOSIS — E10319 Type 1 diabetes mellitus with unspecified diabetic retinopathy without macular edema: Secondary | ICD-10-CM | POA: Insufficient documentation

## 2019-04-17 DIAGNOSIS — Z794 Long term (current) use of insulin: Secondary | ICD-10-CM | POA: Insufficient documentation

## 2019-04-17 DIAGNOSIS — Z20828 Contact with and (suspected) exposure to other viral communicable diseases: Secondary | ICD-10-CM | POA: Diagnosis not present

## 2019-04-17 DIAGNOSIS — M48061 Spinal stenosis, lumbar region without neurogenic claudication: Secondary | ICD-10-CM | POA: Insufficient documentation

## 2019-04-17 DIAGNOSIS — E785 Hyperlipidemia, unspecified: Secondary | ICD-10-CM | POA: Diagnosis not present

## 2019-04-17 DIAGNOSIS — Z79899 Other long term (current) drug therapy: Secondary | ICD-10-CM | POA: Insufficient documentation

## 2019-04-17 DIAGNOSIS — Z87891 Personal history of nicotine dependence: Secondary | ICD-10-CM | POA: Diagnosis not present

## 2019-04-17 DIAGNOSIS — Z01818 Encounter for other preprocedural examination: Secondary | ICD-10-CM | POA: Diagnosis not present

## 2019-04-17 DIAGNOSIS — Z9641 Presence of insulin pump (external) (internal): Secondary | ICD-10-CM | POA: Insufficient documentation

## 2019-04-17 LAB — COMPREHENSIVE METABOLIC PANEL
ALT: 27 U/L (ref 0–44)
AST: 22 U/L (ref 15–41)
Albumin: 4.2 g/dL (ref 3.5–5.0)
Alkaline Phosphatase: 79 U/L (ref 38–126)
Anion gap: 6 (ref 5–15)
BUN: 15 mg/dL (ref 8–23)
CO2: 29 mmol/L (ref 22–32)
Calcium: 9.3 mg/dL (ref 8.9–10.3)
Chloride: 102 mmol/L (ref 98–111)
Creatinine, Ser: 0.88 mg/dL (ref 0.61–1.24)
GFR calc Af Amer: >60 mL/min (ref >60)
GFR calc non Af Amer: >60 mL/min (ref >60)
Glucose, Bld: 102 mg/dL — ABNORMAL HIGH (ref 70–99)
Potassium: 4.7 mmol/L (ref 3.5–5.1)
Sodium: 137 mmol/L (ref 135–145)
Total Bilirubin: 0.5 mg/dL (ref 0.3–1.2)
Total Protein: 6.5 g/dL (ref 6.5–8.1)

## 2019-04-17 LAB — PROTIME-INR
INR: 0.9 (ref 0.8–1.2)
Prothrombin Time: 12.4 seconds (ref 11.4–15.2)

## 2019-04-17 LAB — CBC WITH DIFFERENTIAL/PLATELET
Abs Immature Granulocytes: 0.02 10*3/uL (ref 0.00–0.07)
Basophils Absolute: 0.1 10*3/uL (ref 0.0–0.1)
Basophils Relative: 1 %
Eosinophils Absolute: 0.4 10*3/uL (ref 0.0–0.5)
Eosinophils Relative: 6 %
HCT: 44.1 % (ref 39.0–52.0)
Hemoglobin: 15.4 g/dL (ref 13.0–17.0)
Immature Granulocytes: 0 %
Lymphocytes Relative: 23 %
Lymphs Abs: 1.4 10*3/uL (ref 0.7–4.0)
MCH: 31.8 pg (ref 26.0–34.0)
MCHC: 34.9 g/dL (ref 30.0–36.0)
MCV: 91.1 fL (ref 80.0–100.0)
Monocytes Absolute: 0.6 10*3/uL (ref 0.1–1.0)
Monocytes Relative: 9 %
Neutro Abs: 3.6 10*3/uL (ref 1.7–7.7)
Neutrophils Relative %: 61 %
Platelets: 289 10*3/uL (ref 150–400)
RBC: 4.84 MIL/uL (ref 4.22–5.81)
RDW: 11.9 % (ref 11.5–15.5)
WBC: 6 10*3/uL (ref 4.0–10.5)
nRBC: 0 % (ref 0.0–0.2)

## 2019-04-17 LAB — HEMOGLOBIN A1C
Hgb A1c MFr Bld: 6.5 % — ABNORMAL HIGH (ref 4.8–5.6)
Mean Plasma Glucose: 139.85 mg/dL

## 2019-04-17 LAB — APTT: aPTT: 26 seconds (ref 24–36)

## 2019-04-17 LAB — GLUCOSE, CAPILLARY: Glucose-Capillary: 110 mg/dL — ABNORMAL HIGH (ref 70–99)

## 2019-04-17 LAB — SURGICAL PCR SCREEN
MRSA, PCR: NEGATIVE
Staphylococcus aureus: NEGATIVE

## 2019-04-18 LAB — NOVEL CORONAVIRUS, NAA (HOSP ORDER, SEND-OUT TO REF LAB; TAT 18-24 HRS): SARS-CoV-2, NAA: NOT DETECTED

## 2019-04-18 LAB — ABO/RH: ABO/RH(D): O POS

## 2019-04-18 NOTE — Progress Notes (Signed)
Anesthesia Chart Review   Case: [id number] Date/Time: 04/20/19 1245   Procedure: Lumbar microdisectomy and decompression L5-S1 left (N/A ) - 32min   Anesthesia type: General   Pre-op diagnosis: herniated nucleus polposus L5-S1 left lumbar   [facility] ROOM 07 / [facility]   Surgeon: Latanya Maudlin, MD      DISCUSSION:63 y.o. former smoker (quit 02/09/17) with h/o HLD, asthma, COPD, CAD (nonobstructive on cath 2004), DM I (insulin pump, DOS instructions given from PAT nurse and diabetic coordinator), herniated disc left L5-S1 scheduled for above procedure 04/20/2019 with Dr. Latanya Maudlin.   Pt last seen by pulmonology 04/12/2019.  Seen by Rexene Edison, NP.  Per OV note mild to moderate persistent asthma appears controlled.  He underwent C-spine surgery 05/2016 and was noted to have moderate aspiration riskona swallow study 01/2017    Anticipate pt can proceed with planned procedure barring acute status change.   VS: BP 140/74 (BP Location: Left Arm)   Pulse 72   Temp 37.2 C (Oral)   Resp 18   Ht 5\' 9"  (1.753 m)   Wt 79.5 kg   SpO2 98%   BMI 25.89 kg/m   PROVIDERS: [name], MD is PCP   Kara Mead, MD is Pulmonologist  LABS: Labs reviewed: Acceptable for surgery. (all labs ordered are listed, but only abnormal results are displayed)  Labs Reviewed  GLUCOSE, CAPILLARY - Abnormal; Notable for the following components:      Result Value   Glucose-Capillary 110 (*)    All other components within normal limits  COMPREHENSIVE METABOLIC PANEL - Abnormal; Notable for the following components:   Glucose, Bld 102 (*)    All other components within normal limits  HEMOGLOBIN A1C - Abnormal; Notable for the following components:   Hgb A1c MFr Bld 6.5 (*)    All other components within normal limits  SURGICAL PCR SCREEN  APTT  CBC WITH DIFFERENTIAL/PLATELET  PROTIME-INR  TYPE AND SCREEN  ABO/RH     IMAGES:   EKG: 04/17/2019 Rate 70 bpm Normal sinus rhythm  No  significant change since 2018  CV: Cardiac Cath 09/28/2002  RESULTS:  1. The left main coronary artery:  The left main coronary artery was free of     significant disease.  2. Left anterior descending:  The left anterior descending artery gave rise     to two septal perforators and two diagonal branches. There was some     irregularity in the LAD but no significant obstruction.  3. Circumflex artery:  The circumflex artery was a large dominant vessel     that gave rise to two marginal branches, two posterolateral branches, and     a posterior descending branch.  There was some irregularity in the AV     portion of the circumflex artery but no significant obstruction.  4. Right coronary artery:  The right coronary was a small nondominant vessel     that gave rise to two right ventricular branches. Past Medical History:  Diagnosis Date  . ANXIETY 04/03/2007  . Anxiety   . ASTHMA 09/06/2008  . Asthma   . ASTHMATIC BRONCHITIS, ACUTE 10/25/2008  . Bladder neck obstruction   . CARPAL TUNNEL SYNDROME, BILATERAL 07/31/2007   issues resolved, no surgery  . Cervical disc disease   . Chronic bronchitis ([place])    "get it about q yr" (02/12/2014)  . COPD (chronic obstructive pulmonary disease) ([place])   . CORONARY ARTERY DISEASE 04/03/2007  . DEPRESSION 04/03/2007  .  Depression   . DIABETES MELLITUS, TYPE I 04/03/2007  . Diabetic retinopathy associated with diabetes mellitus due to underlying condition ([place]) 04/03/2007  . DM W/EYE MANIFESTATIONS, TYPE I, UNCONTROLLED 04/04/2007  . 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 ED (erectile dysfunction)   . History of kidney stones   . HYPERLIPIDEMIA 04/04/2007  . Pneumonia    "several times and again today" (02/13/2104)  . Renal insufficiency   . Seizures ([place])    "insulin seizure from time to time; none in the last couple years" (02/12/2014)  . Spinal stenosis     Past Surgical History:  Procedure Laterality Date  . ANTERIOR CERVICAL  DECOMP/DISCECTOMY FUSION  2000   "couple screws and a plate"  . ANTERIOR CERVICAL DECOMP/DISCECTOMY FUSION N/A 06/24/2016   Procedure: ANTERIOR CERVICAL DECOMPRESSION/DISCECTOMY FUSION CERVICAL FOUR - CERVICAL FIVE, CERVICAL FIVE - CERVICAL SIX; REMOVAL TETHER CERVICAL PLATE;  Surgeon: Jovita Gamma, MD;  Location: [facility];  Service: Neurosurgery;  Laterality: N/A;  ANTERIOR CERVICAL DECOMPRESSION/DISCECTOMY FUSION CERVICAL FOUR - CERVICAL FIVE, CERVICAL FIVE - CERVICAL SIX; REMOVAL TETHER CERVICAL PLATE  . APPENDECTOMY    . BACK SURGERY    . CARDIAC CATHETERIZATION  1990's  . CATARACT EXTRACTION W/ INTRAOCULAR LENS  IMPLANT, BILATERAL Bilateral   . CYSTOSCOPY WITH RETROGRADE PYELOGRAM, URETEROSCOPY AND STENT PLACEMENT Bilateral 04/06/2013   Procedure: BILATERAL CYSTOSCOPY WITH RETROGRADE PYELOGRAMS, STENT PLACEMENTS AND LEFT URETEROSCOPY AND STONE REMOVAL;  Surgeon: Alexis Frock, MD;  [facility];  Service: Urology;  Laterality: Bilateral;  . CYSTOSCOPY WITH STENT PLACEMENT Right 04/12/2013   Procedure: CYSTOSCOPY WITH STENT PLACEMENT;  Surgeon: Alexis Frock, MD;  [facility];  Service: Urology;  Laterality: Right;  . CYSTOSCOPY/RETROGRADE/URETEROSCOPY Bilateral 04/12/2013   Procedure: CYSTOSCOPY/RETROGRADE/URETEROSCOPY;  Surgeon: Alexis Frock, MD;  [facility];  Service: Urology;  Laterality: Bilateral;  RIGHT RETROGRADE   . HOLMIUM LASER APPLICATION Left [id number]   Procedure: HOLMIUM LASER APPLICATION;  Surgeon: Alexis Frock, MD;  [facility];  Service: Urology;  Laterality: Left;  . LYMPH NODE DISSECTION  ~ 1960   groin  . stress cardiolite  09/06/2002  . TONSILLECTOMY    . VITRECTOMY Bilateral     MEDICATIONS: . acetaminophen (TYLENOL) 500 MG tablet  . albuterol (PROVENTIL) (2.5 MG/3ML) 0.083% nebulizer solution  . bismuth subsalicylate (PEPTO BISMOL) 262 MG/15ML suspension  . budesonide-formoterol (SYMBICORT) 160-4.5 MCG/ACT inhaler  . cyclobenzaprine  (FLEXERIL) 10 MG tablet  . ibuprofen (ADVIL,MOTRIN) 200 MG tablet  . Insulin Human (INSULIN PUMP) SOLN  . insulin lispro (HUMALOG) 100 UNIT/ML injection  . Multiple Vitamins-Minerals (AIRBORNE PO)  . oxyCODONE-acetaminophen (PERCOCET) 5-325 MG tablet  . simvastatin (ZOCOR) 40 MG tablet  . traZODone (DESYREL) 150 MG tablet  . varenicline (CHANTIX) 1 MG tablet  . VENTOLIN HFA 108 (90 Base) MCG/ACT inhaler   No current facility-administered medications for this encounter.    Maia Plan [facility]Surgical Testing [phone] 04/18/19  12:00 PM

## 2019-04-18 NOTE — Anesthesia Preprocedure Evaluation (Addendum)
Anesthesia Evaluation  Patient identified by MRN, date of birth, ID band Patient awake    Reviewed: Allergy & Precautions, NPO status , Patient's Chart, lab work & pertinent test results  Airway Mallampati: II  TM Distance: >3 FB Neck ROM: Full    Dental no notable dental hx.    Pulmonary asthma , COPD, former smoker,    Pulmonary exam normal breath sounds clear to auscultation       Cardiovascular + CAD  Normal cardiovascular exam Rhythm:Regular Rate:Normal     Neuro/Psych Seizures -,  Anxiety Depression negative psych ROS   GI/Hepatic Neg liver ROS, GERD  ,  Endo/Other  negative endocrine ROSdiabetes, Type 2  Renal/GU Renal InsufficiencyRenal disease  negative genitourinary   Musculoskeletal negative musculoskeletal ROS (+)   Abdominal   Peds negative pediatric ROS (+)  Hematology negative hematology ROS (+)   Anesthesia Other Findings   Reproductive/Obstetrics negative OB ROS                            Anesthesia Physical Anesthesia Plan  ASA: III  Anesthesia Plan: General   Post-op Pain Management:    Induction: Intravenous  PONV Risk Score and Plan: 2 and Ondansetron, Midazolam and Treatment may vary due to age or medical condition  Airway Management Planned: Oral ETT  Additional Equipment:   Intra-op Plan:   Post-operative Plan: Extubation in OR  Informed Consent: I have reviewed the patients History and Physical, chart, labs and discussed the procedure including the risks, benefits and alternatives for the proposed anesthesia with the patient or authorized representative who has indicated his/her understanding and acceptance.     Dental advisory given  Plan Discussed with: CRNA  Anesthesia Plan Comments: (See PAT note 04/17/2019, Konrad Felix, PA-C)       Anesthesia Quick Evaluation

## 2019-04-19 MED ORDER — BUPIVACAINE LIPOSOME 1.3 % IJ SUSP
20.0000 mL | Freq: Once | INTRAMUSCULAR | Status: DC
  Filled 2019-04-19: qty 20

## 2019-04-20 ENCOUNTER — Encounter (HOSPITAL_COMMUNITY): Payer: Self-pay | Admitting: General Practice

## 2019-04-20 ENCOUNTER — Ambulatory Visit (HOSPITAL_COMMUNITY): Payer: 59

## 2019-04-20 ENCOUNTER — Encounter (HOSPITAL_COMMUNITY)
Admission: RE | Disposition: A | Discharge status: Home / Self Care | Payer: Self-pay | Source: Home / Self Care | Attending: Orthopedic Surgery

## 2019-04-20 ENCOUNTER — Ambulatory Visit (HOSPITAL_COMMUNITY): Payer: 59 | Admitting: Certified Registered Nurse Anesthetist

## 2019-04-20 ENCOUNTER — Observation Stay (HOSPITAL_COMMUNITY)
Admission: RE | Admit: 2019-04-20 | Discharge: 2019-04-21 | Disposition: A | Discharge status: Home / Self Care | Payer: 59 | Attending: Orthopedic Surgery | Admitting: Orthopedic Surgery

## 2019-04-20 ENCOUNTER — Other Ambulatory Visit: Payer: Self-pay

## 2019-04-20 ENCOUNTER — Ambulatory Visit (HOSPITAL_COMMUNITY): Payer: 59 | Admitting: Physician Assistant

## 2019-04-20 DIAGNOSIS — M48062 Spinal stenosis, lumbar region with neurogenic claudication: Secondary | ICD-10-CM | POA: Diagnosis present

## 2019-04-20 DIAGNOSIS — E160 Drug-induced hypoglycemia without coma: Secondary | ICD-10-CM | POA: Diagnosis not present

## 2019-04-20 DIAGNOSIS — J449 Chronic obstructive pulmonary disease, unspecified: Secondary | ICD-10-CM | POA: Diagnosis not present

## 2019-04-20 DIAGNOSIS — Z91041 Radiographic dye allergy status: Secondary | ICD-10-CM | POA: Insufficient documentation

## 2019-04-20 DIAGNOSIS — E109 Type 1 diabetes mellitus without complications: Secondary | ICD-10-CM

## 2019-04-20 DIAGNOSIS — Z8249 Family history of ischemic heart disease and other diseases of the circulatory system: Secondary | ICD-10-CM | POA: Insufficient documentation

## 2019-04-20 DIAGNOSIS — T383X5A Adverse effect of insulin and oral hypoglycemic [antidiabetic] drugs, initial encounter: Secondary | ICD-10-CM | POA: Diagnosis not present

## 2019-04-20 DIAGNOSIS — Z794 Long term (current) use of insulin: Secondary | ICD-10-CM | POA: Insufficient documentation

## 2019-04-20 DIAGNOSIS — Z809 Family history of malignant neoplasm, unspecified: Secondary | ICD-10-CM | POA: Diagnosis not present

## 2019-04-20 DIAGNOSIS — Z823 Family history of stroke: Secondary | ICD-10-CM | POA: Diagnosis not present

## 2019-04-20 DIAGNOSIS — E10319 Type 1 diabetes mellitus with unspecified diabetic retinopathy without macular edema: Secondary | ICD-10-CM | POA: Insufficient documentation

## 2019-04-20 DIAGNOSIS — Z87891 Personal history of nicotine dependence: Secondary | ICD-10-CM | POA: Diagnosis not present

## 2019-04-20 DIAGNOSIS — Z7951 Long term (current) use of inhaled steroids: Secondary | ICD-10-CM | POA: Insufficient documentation

## 2019-04-20 DIAGNOSIS — M545 Low back pain, unspecified: Secondary | ICD-10-CM

## 2019-04-20 DIAGNOSIS — Z888 Allergy status to other drugs, medicaments and biological substances status: Secondary | ICD-10-CM | POA: Diagnosis not present

## 2019-04-20 DIAGNOSIS — Z9841 Cataract extraction status, right eye: Secondary | ICD-10-CM | POA: Insufficient documentation

## 2019-04-20 DIAGNOSIS — N289 Disorder of kidney and ureter, unspecified: Secondary | ICD-10-CM | POA: Diagnosis not present

## 2019-04-20 DIAGNOSIS — Z9842 Cataract extraction status, left eye: Secondary | ICD-10-CM | POA: Insufficient documentation

## 2019-04-20 DIAGNOSIS — I2510 Atherosclerotic heart disease of native coronary artery without angina pectoris: Secondary | ICD-10-CM | POA: Diagnosis not present

## 2019-04-20 DIAGNOSIS — Z87442 Personal history of urinary calculi: Secondary | ICD-10-CM | POA: Insufficient documentation

## 2019-04-20 DIAGNOSIS — E785 Hyperlipidemia, unspecified: Secondary | ICD-10-CM | POA: Diagnosis not present

## 2019-04-20 DIAGNOSIS — Z881 Allergy status to other antibiotic agents status: Secondary | ICD-10-CM | POA: Insufficient documentation

## 2019-04-20 DIAGNOSIS — Z961 Presence of intraocular lens: Secondary | ICD-10-CM | POA: Diagnosis not present

## 2019-04-20 DIAGNOSIS — R569 Unspecified convulsions: Secondary | ICD-10-CM | POA: Insufficient documentation

## 2019-04-20 DIAGNOSIS — M4807 Spinal stenosis, lumbosacral region: Secondary | ICD-10-CM | POA: Insufficient documentation

## 2019-04-20 DIAGNOSIS — Z79899 Other long term (current) drug therapy: Secondary | ICD-10-CM | POA: Insufficient documentation

## 2019-04-20 DIAGNOSIS — F419 Anxiety disorder, unspecified: Secondary | ICD-10-CM | POA: Diagnosis not present

## 2019-04-20 DIAGNOSIS — M5117 Intervertebral disc disorders with radiculopathy, lumbosacral region: Secondary | ICD-10-CM | POA: Diagnosis not present

## 2019-04-20 DIAGNOSIS — Z419 Encounter for procedure for purposes other than remedying health state, unspecified: Secondary | ICD-10-CM

## 2019-04-20 DIAGNOSIS — M48061 Spinal stenosis, lumbar region without neurogenic claudication: Secondary | ICD-10-CM | POA: Diagnosis not present

## 2019-04-20 DIAGNOSIS — Z833 Family history of diabetes mellitus: Secondary | ICD-10-CM | POA: Insufficient documentation

## 2019-04-20 DIAGNOSIS — E1069 Type 1 diabetes mellitus with other specified complication: Secondary | ICD-10-CM | POA: Diagnosis not present

## 2019-04-20 DIAGNOSIS — M5126 Other intervertebral disc displacement, lumbar region: Secondary | ICD-10-CM | POA: Diagnosis not present

## 2019-04-20 DIAGNOSIS — E1021 Type 1 diabetes mellitus with diabetic nephropathy: Secondary | ICD-10-CM | POA: Insufficient documentation

## 2019-04-20 DIAGNOSIS — F329 Major depressive disorder, single episode, unspecified: Secondary | ICD-10-CM | POA: Diagnosis not present

## 2019-04-20 DIAGNOSIS — M4326 Fusion of spine, lumbar region: Secondary | ICD-10-CM | POA: Diagnosis not present

## 2019-04-20 DIAGNOSIS — Z981 Arthrodesis status: Secondary | ICD-10-CM | POA: Diagnosis not present

## 2019-04-20 DIAGNOSIS — K219 Gastro-esophageal reflux disease without esophagitis: Secondary | ICD-10-CM | POA: Diagnosis not present

## 2019-04-20 DIAGNOSIS — M5136 Other intervertebral disc degeneration, lumbar region: Secondary | ICD-10-CM | POA: Diagnosis not present

## 2019-04-20 DIAGNOSIS — M5127 Other intervertebral disc displacement, lumbosacral region: Secondary | ICD-10-CM | POA: Diagnosis not present

## 2019-04-20 HISTORY — PX: LUMBAR LAMINECTOMY/DECOMPRESSION MICRODISCECTOMY: SHX5026

## 2019-04-20 LAB — GLUCOSE, CAPILLARY
Glucose-Capillary: 85 mg/dL (ref 70–99)
Glucose-Capillary: 45 mg/dL — ABNORMAL LOW (ref 70–99)
Glucose-Capillary: 76 mg/dL (ref 70–99)
Glucose-Capillary: 21 mg/dL — CL (ref 70–99)
Glucose-Capillary: 128 mg/dL — ABNORMAL HIGH (ref 70–99)
Glucose-Capillary: 184 mg/dL — ABNORMAL HIGH (ref 70–99)
Glucose-Capillary: 78 mg/dL (ref 70–99)
Glucose-Capillary: 89 mg/dL (ref 70–99)
Glucose-Capillary: 137 mg/dL — ABNORMAL HIGH (ref 70–99)
Glucose-Capillary: 310 mg/dL — ABNORMAL HIGH (ref 70–99)

## 2019-04-20 LAB — TYPE AND SCREEN
ABO/RH(D): O POS
Antibody Screen: NEGATIVE

## 2019-04-20 SURGERY — LUMBAR LAMINECTOMY/DECOMPRESSION MICRODISCECTOMY 1 LEVEL
Anesthesia: General

## 2019-04-20 MED ORDER — LIDOCAINE 2% (20 MG/ML) 5 ML SYRINGE
INTRAMUSCULAR | Status: AC
  Filled 2019-04-20: qty 5

## 2019-04-20 MED ORDER — ALBUTEROL SULFATE HFA 108 (90 BASE) MCG/ACT IN AERS: 2.0000 | INHALATION_SPRAY | Freq: Four times a day (QID) | RESPIRATORY_TRACT | Status: DC | PRN

## 2019-04-20 MED ORDER — VARENICLINE TARTRATE 1 MG PO TABS
1.0000 mg | ORAL_TABLET | Freq: Two times a day (BID) | ORAL | Status: DC
  Administered 2019-04-20 – 2019-04-21 (×2): 1 mg via ORAL
  Filled 2019-04-20 (×2): qty 1

## 2019-04-20 MED ORDER — ACETAMINOPHEN 325 MG PO TABS: 650.0000 mg | ORAL_TABLET | ORAL | Status: DC | PRN

## 2019-04-20 MED ORDER — DEXTROSE 50 % IV SOLN
INTRAVENOUS | Status: AC
  Filled 2019-04-20: qty 50

## 2019-04-20 MED ORDER — CYCLOBENZAPRINE HCL 10 MG PO TABS: 10.0000 mg | ORAL_TABLET | Freq: Three times a day (TID) | ORAL | 0 refills | Status: DC | PRN

## 2019-04-20 MED ORDER — PHENYLEPHRINE 40 MCG/ML (10ML) SYRINGE FOR IV PUSH (FOR BLOOD PRESSURE SUPPORT)
PREFILLED_SYRINGE | INTRAVENOUS | Status: AC
  Filled 2019-04-20: qty 10

## 2019-04-20 MED ORDER — THROMBIN (RECOMBINANT) 5000 UNITS EX SOLR
CUTANEOUS | Status: DC | PRN
  Administered 2019-04-20: 5000 [IU] via TOPICAL

## 2019-04-20 MED ORDER — ONDANSETRON HCL 4 MG/2ML IJ SOLN
INTRAMUSCULAR | Status: AC
  Filled 2019-04-20: qty 2

## 2019-04-20 MED ORDER — METHOCARBAMOL 500 MG IVPB - SIMPLE MED
INTRAVENOUS | Status: AC
  Filled 2019-04-20: qty 50

## 2019-04-20 MED ORDER — BISACODYL 5 MG PO TBEC: 5.0000 mg | DELAYED_RELEASE_TABLET | Freq: Every day | ORAL | Status: DC | PRN

## 2019-04-20 MED ORDER — FENTANYL CITRATE (PF) 250 MCG/5ML IJ SOLN
INTRAMUSCULAR | Status: DC | PRN
  Administered 2019-04-20 (×2): 50 ug via INTRAVENOUS
  Administered 2019-04-20: 100 ug via INTRAVENOUS
  Administered 2019-04-20: 50 ug via INTRAVENOUS

## 2019-04-20 MED ORDER — LIDOCAINE 2% (20 MG/ML) 5 ML SYRINGE
INTRAMUSCULAR | Status: DC | PRN
  Administered 2019-04-20: 100 mg via INTRAVENOUS

## 2019-04-20 MED ORDER — MENTHOL 3 MG MT LOZG: 1.0000 | LOZENGE | OROMUCOSAL | Status: DC | PRN

## 2019-04-20 MED ORDER — SODIUM CHLORIDE 0.9 % IV SOLN
INTRAVENOUS | Status: DC
  Administered 2019-04-20 (×2): via INTRAVENOUS

## 2019-04-20 MED ORDER — FLEET ENEMA 7-19 GM/118ML RE ENEM: 1.0000 | ENEMA | Freq: Once | RECTAL | Status: DC | PRN

## 2019-04-20 MED ORDER — THROMBIN (RECOMBINANT) 5000 UNITS EX SOLR
CUTANEOUS | Status: AC
  Filled 2019-04-20: qty 5000

## 2019-04-20 MED ORDER — ONDANSETRON HCL 4 MG PO TABS
4.0000 mg | ORAL_TABLET | Freq: Four times a day (QID) | ORAL | Status: DC | PRN
  Administered 2019-04-20 – 2019-04-21 (×2): 4 mg via ORAL
  Filled 2019-04-20 (×3): qty 1

## 2019-04-20 MED ORDER — LACTATED RINGERS IV SOLN
INTRAVENOUS | Status: DC
  Administered 2019-04-20: 12:00:00 via INTRAVENOUS

## 2019-04-20 MED ORDER — BUPIVACAINE-EPINEPHRINE 0.5% -1:200000 IJ SOLN
INTRAMUSCULAR | Status: AC
  Filled 2019-04-20: qty 1

## 2019-04-20 MED ORDER — ROCURONIUM BROMIDE 10 MG/ML (PF) SYRINGE
PREFILLED_SYRINGE | INTRAVENOUS | Status: DC | PRN
  Administered 2019-04-20: 60 mg via INTRAVENOUS
  Administered 2019-04-20 (×3): 10 mg via INTRAVENOUS

## 2019-04-20 MED ORDER — ALBUTEROL SULFATE (2.5 MG/3ML) 0.083% IN NEBU: 2.5000 mg | INHALATION_SOLUTION | Freq: Four times a day (QID) | RESPIRATORY_TRACT | Status: DC | PRN

## 2019-04-20 MED ORDER — BUPIVACAINE LIPOSOME 1.3 % IJ SUSP
INTRAMUSCULAR | Status: DC | PRN
  Administered 2019-04-20: 20 mL

## 2019-04-20 MED ORDER — BACITRACIN ZINC 500 UNIT/GM EX OINT
TOPICAL_OINTMENT | CUTANEOUS | Status: AC
  Filled 2019-04-20: qty 28.35

## 2019-04-20 MED ORDER — LACTATED RINGERS IV SOLN
INTRAVENOUS | Status: DC
  Administered 2019-04-20: 23:00:00 via INTRAVENOUS

## 2019-04-20 MED ORDER — METHOCARBAMOL 500 MG PO TABS
500.0000 mg | ORAL_TABLET | Freq: Four times a day (QID) | ORAL | Status: DC | PRN
  Administered 2019-04-21 (×2): 500 mg via ORAL
  Filled 2019-04-20 (×3): qty 1

## 2019-04-20 MED ORDER — HYDROCODONE-ACETAMINOPHEN 5-325 MG PO TABS
1.0000 | ORAL_TABLET | ORAL | Status: DC | PRN
  Administered 2019-04-20: 1 via ORAL
  Filled 2019-04-20: qty 1

## 2019-04-20 MED ORDER — BISMUTH SUBSALICYLATE 262 MG/15ML PO SUSP: 30.0000 mL | Freq: Four times a day (QID) | ORAL | Status: DC | PRN

## 2019-04-20 MED ORDER — PHENYLEPHRINE HCL (PRESSORS) 10 MG/ML IV SOLN
INTRAVENOUS | Status: AC
  Filled 2019-04-20: qty 1

## 2019-04-20 MED ORDER — HYDROMORPHONE HCL 1 MG/ML IJ SOLN: 0.5000 mg | INTRAMUSCULAR | Status: DC | PRN

## 2019-04-20 MED ORDER — SODIUM CHLORIDE 0.9 % IV SOLN
INTRAVENOUS | Status: AC
  Filled 2019-04-20: qty 500000

## 2019-04-20 MED ORDER — HYDROMORPHONE HCL 1 MG/ML IJ SOLN
0.2500 mg | INTRAMUSCULAR | Status: DC | PRN
  Administered 2019-04-20 (×4): 0.5 mg via INTRAVENOUS

## 2019-04-20 MED ORDER — BUPIVACAINE-EPINEPHRINE 0.5% -1:200000 IJ SOLN
INTRAMUSCULAR | Status: DC | PRN
  Administered 2019-04-20: 20 mL

## 2019-04-20 MED ORDER — CEFAZOLIN SODIUM-DEXTROSE 1-4 GM/50ML-% IV SOLN
1.0000 g | Freq: Three times a day (TID) | INTRAVENOUS | Status: DC
  Administered 2019-04-20 – 2019-04-21 (×2): 1 g via INTRAVENOUS
  Filled 2019-04-20 (×3): qty 50

## 2019-04-20 MED ORDER — MIDAZOLAM HCL 2 MG/2ML IJ SOLN
INTRAMUSCULAR | Status: DC | PRN
  Administered 2019-04-20: 2 mg via INTRAVENOUS

## 2019-04-20 MED ORDER — HYDROCODONE-ACETAMINOPHEN 7.5-325 MG PO TABS
2.0000 | ORAL_TABLET | ORAL | Status: DC | PRN
  Administered 2019-04-20 (×2): 1 via ORAL
  Administered 2019-04-21 (×2): 2 via ORAL
  Filled 2019-04-20 (×3): qty 2

## 2019-04-20 MED ORDER — PHENOL 1.4 % MT LIQD: 1.0000 | OROMUCOSAL | Status: DC | PRN

## 2019-04-20 MED ORDER — OXYCODONE HCL 5 MG/5ML PO SOLN: 5.0000 mg | Freq: Once | ORAL | Status: DC | PRN

## 2019-04-20 MED ORDER — CHLORHEXIDINE GLUCONATE 4 % EX LIQD: 60.0000 mL | Freq: Once | CUTANEOUS | Status: DC

## 2019-04-20 MED ORDER — METHOCARBAMOL 500 MG IVPB - SIMPLE MED
500.0000 mg | Freq: Four times a day (QID) | INTRAVENOUS | Status: DC | PRN
  Administered 2019-04-20: 500 mg via INTRAVENOUS
  Filled 2019-04-20: qty 50

## 2019-04-20 MED ORDER — HYDROMORPHONE HCL 1 MG/ML IJ SOLN
INTRAMUSCULAR | Status: AC
  Filled 2019-04-20: qty 2

## 2019-04-20 MED ORDER — FENTANYL CITRATE (PF) 250 MCG/5ML IJ SOLN
INTRAMUSCULAR | Status: AC
  Filled 2019-04-20: qty 5

## 2019-04-20 MED ORDER — SUGAMMADEX SODIUM 200 MG/2ML IV SOLN
INTRAVENOUS | Status: DC | PRN
  Administered 2019-04-20: 200 mg via INTRAVENOUS

## 2019-04-20 MED ORDER — DEXAMETHASONE SODIUM PHOSPHATE 10 MG/ML IJ SOLN
INTRAMUSCULAR | Status: DC | PRN
  Administered 2019-04-20: 10 mg via INTRAVENOUS

## 2019-04-20 MED ORDER — ONDANSETRON HCL 4 MG/2ML IJ SOLN
INTRAMUSCULAR | Status: DC | PRN
  Administered 2019-04-20: 4 mg via INTRAVENOUS

## 2019-04-20 MED ORDER — FLUTICASONE FUROATE-VILANTEROL 200-25 MCG/INH IN AEPB
1.0000 | INHALATION_SPRAY | Freq: Every day | RESPIRATORY_TRACT | Status: DC
  Administered 2019-04-21: 1 via RESPIRATORY_TRACT
  Filled 2019-04-20: qty 28

## 2019-04-20 MED ORDER — PROPOFOL 10 MG/ML IV BOLUS
INTRAVENOUS | Status: DC | PRN
  Administered 2019-04-20: 110 mg via INTRAVENOUS

## 2019-04-20 MED ORDER — ONDANSETRON HCL 4 MG PO TABS: 4.0000 mg | ORAL_TABLET | Freq: Four times a day (QID) | ORAL | 0 refills | Status: DC | PRN

## 2019-04-20 MED ORDER — ROCURONIUM BROMIDE 10 MG/ML (PF) SYRINGE
PREFILLED_SYRINGE | INTRAVENOUS | Status: AC
  Filled 2019-04-20: qty 10

## 2019-04-20 MED ORDER — DEXTROSE-NACL 5-0.9 % IV SOLN
INTRAVENOUS | Status: DC
  Administered 2019-04-20: 15:00:00 via INTRAVENOUS

## 2019-04-20 MED ORDER — DEXTROSE 50 % IV SOLN
25.0000 mL | Freq: Once | INTRAVENOUS | Status: DC
  Filled 2019-04-20: qty 50

## 2019-04-20 MED ORDER — MIDAZOLAM HCL 2 MG/2ML IJ SOLN
INTRAMUSCULAR | Status: AC
  Filled 2019-04-20: qty 2

## 2019-04-20 MED ORDER — POLYETHYLENE GLYCOL 3350 17 G PO PACK: 17.0000 g | PACK | Freq: Every day | ORAL | Status: DC | PRN

## 2019-04-20 MED ORDER — PHENYLEPHRINE 40 MCG/ML (10ML) SYRINGE FOR IV PUSH (FOR BLOOD PRESSURE SUPPORT)
PREFILLED_SYRINGE | INTRAVENOUS | Status: DC | PRN
  Administered 2019-04-20 (×4): 80 ug via INTRAVENOUS

## 2019-04-20 MED ORDER — ACETAMINOPHEN 500 MG PO TABS: 1000.0000 mg | ORAL_TABLET | Freq: Four times a day (QID) | ORAL | Status: DC | PRN

## 2019-04-20 MED ORDER — SODIUM CHLORIDE 0.9 % IV SOLN
INTRAVENOUS | Status: DC | PRN
  Administered 2019-04-20: 500 mL

## 2019-04-20 MED ORDER — DEXTROSE 50 % IV SOLN
INTRAVENOUS | Status: AC
  Administered 2019-04-20: 13:00:00
  Filled 2019-04-20: qty 50

## 2019-04-20 MED ORDER — TRAZODONE HCL 50 MG PO TABS
150.0000 mg | ORAL_TABLET | Freq: Every day | ORAL | Status: DC
  Administered 2019-04-20: 150 mg via ORAL
  Filled 2019-04-20: qty 1

## 2019-04-20 MED ORDER — INSULIN ASPART 100 UNIT/ML ~~LOC~~ SOLN: 0.0000 [IU] | Freq: Three times a day (TID) | SUBCUTANEOUS | Status: DC

## 2019-04-20 MED ORDER — PROPOFOL 10 MG/ML IV BOLUS
INTRAVENOUS | Status: AC
  Filled 2019-04-20: qty 20

## 2019-04-20 MED ORDER — HYDROCODONE-ACETAMINOPHEN 7.5-325 MG PO TABS
ORAL_TABLET | ORAL | Status: AC
  Filled 2019-04-20: qty 1

## 2019-04-20 MED ORDER — ONDANSETRON HCL 4 MG/2ML IJ SOLN: 4.0000 mg | Freq: Four times a day (QID) | INTRAMUSCULAR | Status: DC | PRN

## 2019-04-20 MED ORDER — CEFAZOLIN SODIUM-DEXTROSE 2-4 GM/100ML-% IV SOLN
2.0000 g | INTRAVENOUS | Status: AC
  Administered 2019-04-20: 2 g via INTRAVENOUS
  Filled 2019-04-20: qty 100

## 2019-04-20 MED ORDER — OXYCODONE HCL 5 MG PO TABS: 5.0000 mg | ORAL_TABLET | Freq: Once | ORAL | Status: DC | PRN

## 2019-04-20 MED ORDER — OXYCODONE-ACETAMINOPHEN 5-325 MG PO TABS: 1.0000 | ORAL_TABLET | Freq: Four times a day (QID) | ORAL | 0 refills | Status: DC | PRN

## 2019-04-20 MED ORDER — DEXTROSE 50 % IV SOLN
INTRAVENOUS | Status: DC | PRN
  Administered 2019-04-20: 12.5 g via INTRAVENOUS
  Administered 2019-04-20: 25 g via INTRAVENOUS

## 2019-04-20 MED ORDER — SODIUM CHLORIDE 0.9 % IV SOLN
INTRAVENOUS | Status: DC | PRN
  Administered 2019-04-20: 50 ug/min via INTRAVENOUS

## 2019-04-20 MED ORDER — ACETAMINOPHEN 650 MG RE SUPP
650.0000 mg | RECTAL | Status: DC | PRN
  Filled 2019-04-20: qty 1

## 2019-04-20 MED ORDER — DEXAMETHASONE SODIUM PHOSPHATE 10 MG/ML IJ SOLN
INTRAMUSCULAR | Status: AC
  Filled 2019-04-20: qty 1

## 2019-04-20 MED ORDER — PROMETHAZINE HCL 25 MG/ML IJ SOLN: 6.2500 mg | INTRAMUSCULAR | Status: DC | PRN

## 2019-04-20 SURGICAL SUPPLY — 43 items
AGENT HMST SPONGE THK3/8 (HEMOSTASIS) ×1
BAG DECANTER FOR FLEXI CONT (MISCELLANEOUS) ×2 IMPLANT
BAG SPEC THK2 15X12 ZIP CLS (MISCELLANEOUS)
BAG ZIPLOCK 12X15 (MISCELLANEOUS) IMPLANT
CLEANER TIP ELECTROSURG 2X2 (MISCELLANEOUS) ×2 IMPLANT
COVER SURGICAL LIGHT HANDLE (MISCELLANEOUS) ×2 IMPLANT
COVER WAND RF STERILE (DRAPES) IMPLANT
DRAPE MICROSCOPE LEICA (MISCELLANEOUS) ×2 IMPLANT
DRAPE POUCH INSTRU U-SHP 10X18 (DRAPES) ×2 IMPLANT
DRAPE SHEET LG 3/4 BI-LAMINATE (DRAPES) ×2 IMPLANT
DRAPE SURG 17X11 SM STRL (DRAPES) ×2 IMPLANT
DRSG ADAPTIC 3X8 NADH LF (GAUZE/BANDAGES/DRESSINGS) ×2 IMPLANT
DRSG PAD ABDOMINAL 8X10 ST (GAUZE/BANDAGES/DRESSINGS) ×3 IMPLANT
DURAPREP 26ML APPLICATOR (WOUND CARE) ×2 IMPLANT
ELECT BLADE TIP CTD 4 INCH (ELECTRODE) ×2 IMPLANT
ELECT REM PT RETURN 15FT ADLT (MISCELLANEOUS) ×2 IMPLANT
GAUZE SPONGE 4X4 12PLY STRL (GAUZE/BANDAGES/DRESSINGS) ×2 IMPLANT
GLOVE BIOGEL PI IND STRL 8.5 (GLOVE) ×1 IMPLANT
GLOVE BIOGEL PI INDICATOR 8.5 (GLOVE) ×1
GLOVE ECLIPSE 8.0 STRL XLNG CF (GLOVE) ×2 IMPLANT
GOWN STRL REUS W/TWL XL LVL3 (GOWN DISPOSABLE) ×2 IMPLANT
HEMOSTAT SPONGE AVITENE ULTRA (HEMOSTASIS) ×2 IMPLANT
KIT BASIN OR (CUSTOM PROCEDURE TRAY) ×2 IMPLANT
KIT TURNOVER KIT A (KITS) ×1 IMPLANT
MANIFOLD NEPTUNE II (INSTRUMENTS) ×2 IMPLANT
NDL SPNL 18GX3.5 QUINCKE PK (NEEDLE) ×2 IMPLANT
NEEDLE HYPO 22GX1.5 SAFETY (NEEDLE) ×3 IMPLANT
NEEDLE SPNL 18GX3.5 QUINCKE PK (NEEDLE) ×4 IMPLANT
PACK LAMINECTOMY ORTHO (CUSTOM PROCEDURE TRAY) ×2 IMPLANT
PATTIES SURGICAL .5 X.5 (GAUZE/BANDAGES/DRESSINGS) IMPLANT
PATTIES SURGICAL .75X.75 (GAUZE/BANDAGES/DRESSINGS) ×2 IMPLANT
PATTIES SURGICAL 1X1 (DISPOSABLE) ×2 IMPLANT
SPONGE LAP 4X18 RFD (DISPOSABLE) IMPLANT
STAPLER VISISTAT 35W (STAPLE) ×2 IMPLANT
STRIP CLOSURE SKIN 1/2X4 (GAUZE/BANDAGES/DRESSINGS) ×2 IMPLANT
SUT VIC AB 1 CT1 27 (SUTURE) ×4
SUT VIC AB 1 CT1 27XBRD ANTBC (SUTURE) ×2 IMPLANT
SUT VIC AB 2-0 CT1 27 (SUTURE) ×4
SUT VIC AB 2-0 CT1 TAPERPNT 27 (SUTURE) ×2 IMPLANT
SYR 20ML LL LF (SYRINGE) ×4 IMPLANT
TAPE CLOTH SURG 6X10 WHT LF (GAUZE/BANDAGES/DRESSINGS) ×1 IMPLANT
TOWEL OR 17X26 10 PK STRL BLUE (TOWEL DISPOSABLE) ×2 IMPLANT
TOWEL OR NON WOVEN STRL DISP B (DISPOSABLE) ×2 IMPLANT

## 2019-04-20 NOTE — Consult Note (Signed)
Medical Consult  James [name] [id number] DOB: 09-28-1955 DOA: 04/20/2019  PCP: [name], MD  Requesting physician: Dr. Gladstone Lighter Reason for consultation: Hypoglycemia  Chief Complaint: Hypoglycemia  HPI:  63 year old man PMH diabetes mellitus type 1 since age 50, managed with insulin pump for 7 years, underwent elective lumbar microdiscectomy today and was noted to have perioperative hypoglycemia.  TRH consulted for recommendations and further evaluation of hypoglycemia.  Patient has been on insulin pump for 7 years and generally is quite well controlled by report.  He has had no issues with his pump and has no difficulty managing it.  He does have occasional lows but knows how to handle these.  They tend to occur when he does not eat properly.  Last evening he took his insulin pump off and fell asleep.  This morning his blood sugar was over 300 so he took 13 units of Humalog.  He thinks this is why he subsequently had hypoglycemia perioperatively.  Postoperatively he is feeling fairly well and is preparing to eat dinner.  He is anxious to resume his pump.  Wife at bedside corroborates his history and she is confident in his use of it.  Patient also brought his glucometer from home and checks up to 10 times per Xiao per wife.  Review of Systems:  Negative for fever, visual changes, sore throat, rash, new muscle aches, chest pain, SOB, dysuria, bleeding, n/v/abdominal pain.  Past Medical History:  Diagnosis Date  . ANXIETY 04/03/2007  . Anxiety   . ASTHMA 09/06/2008  . Asthma   . ASTHMATIC BRONCHITIS, ACUTE 10/25/2008  . Bladder neck obstruction   . CARPAL TUNNEL SYNDROME, BILATERAL 07/31/2007   issues resolved, no surgery  . Cervical disc disease   . Chronic bronchitis ([place])    "get it about q yr" (02/12/2014)  . COPD (chronic obstructive pulmonary disease) ([place])   . CORONARY ARTERY DISEASE 04/03/2007  . DEPRESSION 04/03/2007  . Depression   . DIABETES MELLITUS, TYPE I 04/03/2007  .  Diabetic retinopathy associated with diabetes mellitus due to underlying condition ([place]) 04/03/2007  . DM W/EYE MANIFESTATIONS, TYPE I, UNCONTROLLED 04/04/2007  . 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 ED (erectile dysfunction)   . History of kidney stones   . HYPERLIPIDEMIA 04/04/2007  . Pneumonia    "several times and again today" (02/13/2104)  . Renal insufficiency   . Seizures ([place])    "insulin seizure from time to time; none in the last couple years" (02/12/2014)  . Spinal stenosis     Past Surgical History:  Procedure Laterality Date  . ANTERIOR CERVICAL DECOMP/DISCECTOMY FUSION  2000   "couple screws and a plate"  . ANTERIOR CERVICAL DECOMP/DISCECTOMY FUSION N/A 06/24/2016   Procedure: ANTERIOR CERVICAL DECOMPRESSION/DISCECTOMY FUSION CERVICAL FOUR - CERVICAL FIVE, CERVICAL FIVE - CERVICAL SIX; REMOVAL TETHER CERVICAL PLATE;  Surgeon: Jovita Gamma, MD;  Location: [facility];  Service: Neurosurgery;  Laterality: N/A;  ANTERIOR CERVICAL DECOMPRESSION/DISCECTOMY FUSION CERVICAL FOUR - CERVICAL FIVE, CERVICAL FIVE - CERVICAL SIX; REMOVAL TETHER CERVICAL PLATE  . APPENDECTOMY    . BACK SURGERY    . CARDIAC CATHETERIZATION  1990's  . CATARACT EXTRACTION W/ INTRAOCULAR LENS  IMPLANT, BILATERAL Bilateral   . CYSTOSCOPY WITH RETROGRADE PYELOGRAM, URETEROSCOPY AND STENT PLACEMENT Bilateral 04/06/2013   Procedure: BILATERAL CYSTOSCOPY WITH RETROGRADE PYELOGRAMS, STENT PLACEMENTS AND LEFT URETEROSCOPY AND STONE REMOVAL;  Surgeon: Alexis Frock, MD;  Location[facility];  Service: Urology;  Laterality: Bilateral;  . [place]  Right 04/12/2013   Procedure: CYSTOSCOPY WITH STENT PLACEMENT;  Surgeon: Alexis Frock, MD;  [facility];  Service: Urology;  Laterality: Right;  . CYSTOSCOPY/RETROGRADE/URETEROSCOPY Bilateral 04/12/2013   Procedure: CYSTOSCOPY/RETROGRADE/URETEROSCOPY;  Surgeon: Alexis Frock, MD;  [facility];  Service: Urology;  Laterality: Bilateral;   RIGHT RETROGRADE   . HOLMIUM LASER APPLICATION Left [id number]   Procedure: HOLMIUM LASER APPLICATION;  Surgeon: Alexis Frock, MD;  [facility];  Service: Urology;  Laterality: Left;  . LYMPH NODE DISSECTION  ~ 1960   groin  . stress cardiolite  09/06/2002  . TONSILLECTOMY    . VITRECTOMY Bilateral      reports that he quit smoking about 2 years ago. His smoking use included cigarettes. He smoked 0.00 packs per [place]. He has never used smokeless tobacco. He reports that he does not drink alcohol or use drugs. Mobility: Ambulatory  Allergies  Allergen Reactions  . Augmentin [Amoxicillin-Pot Clavulanate] Nausea And Vomiting and Other (See Comments)    Did it involve swelling of the face/tongue/throat, SOB, or low BP? No Did it involve sudden or severe rash/hives, skin peeling, or any reaction on the inside of your mouth or nose? No Did you need to seek medical attention at a hospital or doctor's office? No When did it last happen?5+ years If all above answers are "NO", may proceed with cephalosporin use.   . Iohexol Other (See Comments)    Dizziness/sweating    . Ivp Dye [Iodinated Diagnostic Agents] Other (See Comments)    Dizziness/sweating ALSO  . Lexapro [Escitalopram Oxalate] Itching    Family History  Problem Relation Age of Onset  . Stroke Father        strong FH cerebrovascular disease  . Diabetes Brother   . Heart disease Brother        CHF  . Cancer Mother   . Colon cancer Neg Hx      Prior to Admission medications   Medication Sig Start Date End Date Taking? Authorizing Provider  acetaminophen (TYLENOL) 500 MG tablet Take 1,000 mg by mouth every 6 (six) hours as needed for moderate pain or headache.   Yes [provider]  bismuth subsalicylate (PEPTO BISMOL) 262 MG/15ML suspension Take 30 mLs by mouth every 6 (six) hours as needed for indigestion or diarrhea or loose stools.   Yes [provider]  budesonide-formoterol (SYMBICORT)  160-4.5 MCG/ACT inhaler INHALE 2 PUFFS INTO THE LUNGS 2 (TWO) TIMES DAILY. Patient taking differently: Inhale 2 puffs into the lungs 2 (two) times daily.  08/30/18  Yes Rigoberto Noel, MD  ibuprofen (ADVIL,MOTRIN) 200 MG tablet Take 400-800 mg by mouth every 8 (eight) hours as needed for headache or moderate pain.    Yes [provider]  Insulin Human (INSULIN PUMP) SOLN Inject into the skin continuous. Humalog   Yes [provider]  insulin lispro (HUMALOG) 100 UNIT/ML injection Used in insulin pump   Yes [provider]  Multiple Vitamins-Minerals (AIRBORNE PO) Take 1 tablet by mouth 3 (three) times daily.   Yes [provider]  simvastatin (ZOCOR) 40 MG tablet Take 40 mg by mouth at bedtime.    Yes [provider]  traZODone (DESYREL) 150 MG tablet Take 150 mg by mouth at bedtime.   Yes [provider]  varenicline (CHANTIX) 1 MG tablet Take 1 mg by mouth 2 (two) times daily.   Yes [provider]  albuterol (PROVENTIL) (2.5 MG/3ML) 0.083% nebulizer solution Take 3 mLs (2.5 mg total)  by nebulization every 6 (six) hours as needed for wheezing or shortness of breath. 12/20/17   Rigoberto Noel, MD  cyclobenzaprine (FLEXERIL) 10 MG tablet Take 1 tablet (10 mg total) by mouth 3 (three) times daily as needed for muscle spasms. 04/20/19   Constable, Amber, PA-C  ondansetron (ZOFRAN) 4 MG tablet Take 1 tablet (4 mg total) by mouth every 6 (six) hours as needed for nausea or vomiting. 04/20/19   Constable, Amber, PA-C  oxyCODONE-acetaminophen (PERCOCET) 5-325 MG tablet Take 1 tablet by mouth every 6 (six) hours as needed for severe pain. 04/20/19   Constable, Amber, PA-C  VENTOLIN HFA 108 (90 Base) MCG/ACT inhaler INHALE 2 PUFFS BY MOUTH INTO THE LUNGS EVERY 6 HOURS AS NEEDED FOR WHEEZING OR SHORTNESS OF BREATH. Patient taking differently: Inhale 2 puffs into the lungs every 6 (six) hours as needed for wheezing or shortness of breath.  12/15/17   Rigoberto Noel, MD    Physical Exam: Vitals:   04/20/19 1654 04/20/19 1838  BP: (!) 142/79 (!) 146/84  Pulse: 93 92  Resp: 16 16  Temp: 97.6 F (36.4 C) 98.1 F (36.7 C)  SpO2: 100% 100%    Constitutional:   . Appears calm and comfortable Eyes:  . pupils and irises appear normal . Normal lids  ENMT:  . grossly normal hearing  . Lips appear normal Neck:  . neck appears normal, no masses . no thyromegaly Respiratory:  . CTA bilaterally, no w/r/r.  . Respiratory effort normal.  Cardiovascular:  . RRR, no m/r/g . No LE extremity edema   Abdomen:  . Soft, nontender Musculoskeletal:  . Grossly unremarkable Skin:  . No rashes, lesions, ulcers . palpation of skin: no induration or nodules Psychiatric:  . Mental status o Mood, affect appropriate  I have personally reviewed following labs and imaging studies  Labs:  Blood sugar initially was 85, however several episodes of hypoglycemia, lowest 21.  No hypoglycemia since 1359.    Active Problems:   Spinal stenosis, lumbar region with neurogenic claudication   Hypoglycemia due to insulin   DM type 1 (diabetes mellitus, type 1) (HCC)   Assessment/Plan Hypoglycemia, diabetes mellitus type 1 managed with insulin pump --Secondary to insulin injection this morning in the absence of food intake, over correction accidental, resulting in perioperative hypoglycemia. --Now resolved.  About to eat dinner.  Has been using pump for 7 years and has no difficulty managing.  Anticipate will do very well resuming pump immediately.  Wife at bedside agrees. --Initiate pump usage per protocol.  RN to follow blood sugars per protocol.  Status post spine surgery --Per orthopedics  We will follow along with you.  If blood sugars remain stable, anticipate he will be stable for discharge tomorrow.  Family Communication: Discussed in detail with wife  Time spent: 27 minutes  Murray Hodgkins, MD  [facility] Direct contact: see  [email or web address]  7PM-7AM contact night coverage as below   1. Check the care team in [facility] and look for a) attending/consulting [facility] provider listed and b) the [facility] team listed 2. Log into [email or web address] and use [facility] universal password to access. If you do not have the password, please contact the hospital operator. 3. Locate the [facility] provider you are looking for under [facility] and page to a number that you can be directly reached. 4. If you still have difficulty reaching the provider, please page the [facility] (Director on Call) for the Hospitalists listed on amion for assistance.  04/20/2019, 7:43 PM

## 2019-04-20 NOTE — H&P (Signed)
James [name] is an 63 y.o. male.   Chief Complaint: low back pain HPI: James [name] presented to the office for low back pain after incorrectly lifting a mower into his truck. He was leaning forward to get his arm around it and lifted with his back 5 times. He notes that the last time he lifted the mower down he noticed increased pain. No radiation of pain into his legs at first but is now having pain into the left leg. No numbness or tingling into his legs. He states his pain is worse on the left. He is taking Advil right now, his PCP gave him methocarbamol which does not help. He received an injection by his PCP that had Toradol in it, it helped some.  He is diabetic but has an insulin pump. No change in bladder or bowel function. He did not improve with oral prednisone. MRI showed a disc herniation at L5-S1 on the left.   Past Medical History:  Diagnosis Date  . ANXIETY 04/03/2007  . Anxiety   . ASTHMA 09/06/2008  . Asthma   . ASTHMATIC BRONCHITIS, ACUTE 10/25/2008  . Bladder neck obstruction   . CARPAL TUNNEL SYNDROME, BILATERAL 07/31/2007   issues resolved, no surgery  . Cervical disc disease   . Chronic bronchitis ([place])    "get it about q yr" (02/12/2014)  . COPD (chronic obstructive pulmonary disease) ([place])   . CORONARY ARTERY DISEASE 04/03/2007  . DEPRESSION 04/03/2007  . Depression   . DIABETES MELLITUS, TYPE I 04/03/2007  . Diabetic retinopathy associated with diabetes mellitus due to underlying condition ([place]) 04/03/2007  . DM W/EYE MANIFESTATIONS, TYPE I, UNCONTROLLED 04/04/2007  . 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 ED (erectile dysfunction)   . History of kidney stones   . HYPERLIPIDEMIA 04/04/2007  . Pneumonia    "several times and again today" (02/13/2104)  . Renal insufficiency   . Seizures ([place])    "insulin seizure from time to time; none in the last couple years" (02/12/2014)  . Spinal stenosis     Past Surgical History:  Procedure Laterality Date  . ANTERIOR  CERVICAL DECOMP/DISCECTOMY FUSION  2000   "couple screws and a plate"  . ANTERIOR CERVICAL DECOMP/DISCECTOMY FUSION N/A 06/24/2016   Procedure: ANTERIOR CERVICAL DECOMPRESSION/DISCECTOMY FUSION CERVICAL FOUR - CERVICAL FIVE, CERVICAL FIVE - CERVICAL SIX; REMOVAL TETHER CERVICAL PLATE;  Surgeon: Jovita Gamma, MD;  Location: [facility];  Service: Neurosurgery;  Laterality: N/A;  ANTERIOR CERVICAL DECOMPRESSION/DISCECTOMY FUSION CERVICAL FOUR - CERVICAL FIVE, CERVICAL FIVE - CERVICAL SIX; REMOVAL TETHER CERVICAL PLATE  . APPENDECTOMY    . BACK SURGERY    . CARDIAC CATHETERIZATION  1990's  . CATARACT EXTRACTION W/ INTRAOCULAR LENS  IMPLANT, BILATERAL Bilateral   . CYSTOSCOPY WITH RETROGRADE PYELOGRAM, URETEROSCOPY AND STENT PLACEMENT Bilateral 04/06/2013   Procedure: BILATERAL CYSTOSCOPY WITH RETROGRADE PYELOGRAMS, STENT PLACEMENTS AND LEFT URETEROSCOPY AND STONE REMOVAL;  Surgeon: Alexis Frock, MD;  [facility];  Service: Urology;  Laterality: Bilateral;  . CYSTOSCOPY WITH STENT PLACEMENT Right 04/12/2013   Procedure: CYSTOSCOPY WITH STENT PLACEMENT;  Surgeon: Alexis Frock, MD;  [facility];  Service: Urology;  Laterality: Right;  . CYSTOSCOPY/RETROGRADE/URETEROSCOPY Bilateral 04/12/2013   Procedure: CYSTOSCOPY/RETROGRADE/URETEROSCOPY;  Surgeon: Alexis Frock, MD;  [facility];  Service: Urology;  Laterality: Bilateral;  RIGHT RETROGRADE   . HOLMIUM LASER APPLICATION Left [id number]   Procedure: HOLMIUM LASER APPLICATION;  Surgeon: Alexis Frock, MD;  [facility];  Service: Urology;  Laterality: Left;  .  LYMPH NODE DISSECTION  ~ 1960   groin  . stress cardiolite  09/06/2002  . TONSILLECTOMY    . VITRECTOMY Bilateral     Family History  Problem Relation Age of Onset  . Stroke Father        strong FH cerebrovascular disease  . Diabetes Brother   . Heart disease Brother        CHF  . Cancer Mother   . Colon cancer Neg Hx    Social History:  reports that he quit smoking  about 2 years ago. His smoking use included cigarettes. He smoked 0.00 packs per [place]. He has never used smokeless tobacco. He reports that he does not drink alcohol or use drugs.  Allergies:  Allergies  Allergen Reactions  . Augmentin [Amoxicillin-Pot Clavulanate] Nausea And Vomiting and Other (See Comments)    Did it involve swelling of the face/tongue/throat, SOB, or low BP? No Did it involve sudden or severe rash/hives, skin peeling, or any reaction on the inside of your mouth or nose? No Did you need to seek medical attention at a hospital or doctor's office? No When did it last happen?5+ years If all above answers are "NO", may proceed with cephalosporin use.   . Iohexol Other (See Comments)    Dizziness/sweating    . Ivp Dye [Iodinated Diagnostic Agents] Other (See Comments)    Dizziness/sweating ALSO  . Lexapro [Escitalopram Oxalate] Itching     Current Outpatient Medications:  .  acetaminophen (TYLENOL) 500 MG tablet, Take 1,000 mg by mouth every 6 (six) hours as needed for moderate pain or headache., Disp: , Rfl:  .  albuterol (PROVENTIL) (2.5 MG/3ML) 0.083% nebulizer solution, Take 3 mLs (2.5 mg total) by nebulization every 6 (six) hours as needed for wheezing or shortness of breath., Disp: 60 vial, Rfl: 3 .  bismuth subsalicylate (PEPTO BISMOL) 262 MG/15ML suspension, Take 30 mLs by mouth every 6 (six) hours as needed for indigestion or diarrhea or loose stools., Disp: , Rfl:  .  budesonide-formoterol (SYMBICORT) 160-4.5 MCG/ACT inhaler, INHALE 2 PUFFS INTO THE LUNGS 2 (TWO) TIMES DAILY. (Patient taking differently: Inhale 2 puffs into the lungs 2 (two) times daily. ), Disp: 1 Inhaler, Rfl: 6 .  cyclobenzaprine (FLEXERIL) 10 MG tablet, Take 10 mg by mouth 3 (three) times daily as needed for muscle spasms., Disp: , Rfl:  .  ibuprofen (ADVIL,MOTRIN) 200 MG tablet, Take 400-800 mg by mouth every 8 (eight) hours as needed for headache or moderate pain. , Disp: , Rfl:  .   Insulin Human (INSULIN PUMP) SOLN, Inject into the skin continuous. Humalog, Disp: , Rfl:  .  insulin lispro (HUMALOG) 100 UNIT/ML injection, Used in insulin pump, Disp: , Rfl:  .  Multiple Vitamins-Minerals (AIRBORNE PO), Take 1 tablet by mouth 3 (three) times daily., Disp: , Rfl:  .  oxyCODONE-acetaminophen (PERCOCET) 5-325 MG tablet, Take 1 tablet by mouth every 8 (eight) hours as needed for severe pain., Disp: , Rfl:  .  simvastatin (ZOCOR) 40 MG tablet, Take 40 mg by mouth at bedtime. , Disp: , Rfl: 5 .  traZODone (DESYREL) 150 MG tablet, Take 150 mg by mouth at bedtime., Disp: , Rfl: 5 .  varenicline (CHANTIX) 1 MG tablet, Take 1 mg by mouth 2 (two) times daily., Disp: , Rfl:  .  VENTOLIN HFA 108 (90 Base) MCG/ACT inhaler, INHALE 2 PUFFS BY MOUTH INTO THE LUNGS EVERY 6 HOURS AS NEEDED FOR WHEEZING OR SHORTNESS OF BREATH. (Patient taking differently: Inhale  2 puffs into the lungs every 6 (six) hours as needed for wheezing or shortness of breath. ), Disp: 18 g, Rfl: 2  Review of Systems  Constitutional: Negative.   Eyes: Negative.   Respiratory: Negative.   Cardiovascular: Negative.   Gastrointestinal: Negative.   Genitourinary: Negative.   Musculoskeletal: Positive for back pain, joint pain and myalgias. Negative for falls and neck pain.  Skin: Negative.   Neurological: Negative.   Endo/Heme/Allergies: Negative.   Psychiatric/Behavioral: Negative.    Vitals Temp (F)   97.6      Pulse Rate   91      Resp   18      BP   116/71      SpO2 (%)   97         Physical Exam  Constitutional: He is oriented to person, place, and time. He appears well-developed and well-nourished. No distress.  HENT:  Head: Normocephalic and atraumatic.  Right Ear: External ear normal.  Left Ear: External ear normal.  Nose: Nose normal.  Mouth/Throat: Oropharynx is clear and moist.  Eyes: Conjunctivae and EOM are normal.  Neck: Normal range of motion. Neck supple.  Cardiovascular: Normal rate,  regular rhythm, normal heart sounds and intact distal pulses.  No murmur heard. Respiratory: Effort normal and breath sounds normal. No respiratory distress. He has no wheezes.  GI: Soft. Bowel sounds are normal. He exhibits no distension. There is no abdominal tenderness.  Musculoskeletal:     Comments: Patient has pain with motion of the lumbar spine, which does not radiate into the right and left legs. No tenderness to palpation about the paralumbar musculature. No masses or tumors palpated about the lumbar spine. Sensation to light touch is intact in the LE. Motor strength 5/5 in the LE. Negative SLR on the right, strongly positive on the left.   Neurological: He is alert and oriented to person, place, and time. He has normal strength. No sensory deficit.  Skin: No rash noted. He is not diaphoretic. No erythema.  Psychiatric: He has a normal mood and affect. His behavior is normal.     Assessment/Plan Lumbar disc herniation L5-S1 left He needs a decompressive lumbar laminectomy and microdiscectomy L5-S1 on the left. Risks and benefits of the procedure discussed with the patient and Dr. Gladstone Lighter. Will stay overnight for observation.   H&P performed by Dr. Leland Johns, PA-C 04/20/2019, 9:34 AM

## 2019-04-20 NOTE — Brief Op Note (Signed)
04/20/2019  3:06 PM  PATIENT:  James [name]  63 y.o. male  PRE-OPERATIVE DIAGNOSIS:  herniated nucleus polposus L5-S1 left lumbar and Spinal Stenosis.  POST-OPERATIVE DIAGNOSIS:  herniated nucleus polposus L5-S1 left lumbar and spinal Stenosis and Foraminal Stenosis involving the L-5 and S-1 nerve roots on the left.  PROCEDURE:  Procedure(s) with comments: Lumbar microdisectomy and decompression L5-S1 left (N/A) - 61min for Spinal Stenosis at L-5-S-1. Foraminotomies for the L-5 and S-1 Nerve roots on the Left.  SURGEON:  Surgeon(s) and Role:    * Latanya Maudlin, MD - Primary  PHYSICIAN ASSISTANT: Ardeen Jourdain PA  ASSISTANTS: Ardeen Jourdain PA   ANESTHESIA:   general  EBL:  50 mL   BLOOD ADMINISTERED:none  DRAINS: none   LOCAL MEDICATIONS USED:  MARCAINE 20cc of 0.50% with Epinephrine at the start of the case and 20cc of Exparel at the end of the case.     SPECIMEN:  Source of Specimen:  L-5-S-1 Disc Material.  DISPOSITION OF SPECIMEN:  PATHOLOGY  COUNTS:  YES  TOURNIQUET:  * No tourniquets in log *  DICTATION: .Other Dictation: Dictation Number [id number]  PLAN OF CARE: Admit for overnight observation  PATIENT DISPOSITION:  PACU - hemodynamically stable.   Delay start of Pharmacological VTE agent (>24hrs) due to surgical blood loss or risk of bleeding: yes

## 2019-04-20 NOTE — Transfer of Care (Signed)
Immediate Anesthesia Transfer of Care Note  Patient: Ralphael Trame Marlin  Procedure(s) Performed: Lumbar microdisectomy and decompression L5-S1 left (N/A )  Patient Location: PACU  Anesthesia Type:General  Level of Consciousness: drowsy  Airway & Oxygen Therapy: Patient Spontanous Breathing and Patient connected to face mask oxygen  Post-op Assessment: Report given to RN and Post -op Vital signs reviewed and stable  Post vital signs: Reviewed and stable  Last Vitals:  Vitals Value Taken Time  BP 128/93 04/20/19 1527  Temp    Pulse 85 04/20/19 1530  Resp 16 04/20/19 1530  SpO2 100 % 04/20/19 1530  Vitals shown include unvalidated device data.  Last Pain:  Vitals:   04/20/19 1155  TempSrc:   PainSc: 7          Complications: No apparent anesthesia complications

## 2019-04-20 NOTE — Progress Notes (Signed)
Inpatient Diabetes Program Recommendations  AACE/ADA: New Consensus Statement on Inpatient Glycemic Control (2015)  Target Ranges:  Prepandial:   less than 140 mg/dL      Peak postprandial:   less than 180 mg/dL (1-2 hours)      Critically ill patients:  140 - 180 mg/dL   Lab Results  Component Value Date   GLUCAP 78 04/20/2019   HGBA1C 6.5 (H) 04/17/2019    Review of Glycemic Control  Diabetes history: DM1 Outpatient Diabetes medications: Insulin pump ([name]) Current orders for Inpatient glycemic control: Novolog 0-15 units tidwc  HgbA1C - 6.5% - good control. Had hypo of 21 mg/dL at 1359. Received Decadron 10 mg @ [id number].   Inpatient Diabetes Program Recommendations:     D/C Novolog 0-15 units tidwc Please order Insulin Pump Order Set for pt to manage his insulin pump while inpatient.  Will need to have pt sign contract and give copy of worksheet for CHOs and units bolused.  Will attempt to speak with RN on floor that pt will likely be transferred.  Thank you. Lorenda Peck, RD, LDN, CDE Inpatient Diabetes Coordinator [phone]

## 2019-04-20 NOTE — Progress Notes (Signed)
Per Clifton Custard in PACU, medicine consult placed for patient due to hypoglycemia in the OR, [id number] to remain infusing until medications reviewed and revised.

## 2019-04-20 NOTE — Progress Notes (Signed)
Inpatient Diabetes Program Recommendations  AACE/ADA: New Consensus Statement on Inpatient Glycemic Control (2015)  Target Ranges:  Prepandial:   less than 140 mg/dL      Peak postprandial:   less than 180 mg/dL (1-2 hours)      Critically ill patients:  140 - 180 mg/dL   Lab Results  Component Value Date   GLUCAP 137 (H) 04/20/2019   HGBA1C 6.5 (H) 04/17/2019    Review of Glycemic Control  Pt states he injected 13 units of Humalog this morning for blood sugar of 330 mg/dL. This was not a bolus from pump, this was from vial/syringe. Likely reason for CBG of 21 in OR.   Spoke with Dr. Gladstone Lighter and RN or Hospitalist will place orders for Insulin Pump. Pt has all supplies. Instructed to let RN know when he gives a bolus to chart on flowsheet. Will need to sign contract.  Thank you. Lorenda Peck, RD, LDN, CDE Inpatient Diabetes Coordinator [phone]

## 2019-04-20 NOTE — Anesthesia Procedure Notes (Signed)
Procedure Name: Intubation Date/Time: 04/20/2019 1:11 PM Performed by: Sharlette Dense, CRNA Patient Re-evaluated:Patient Re-evaluated prior to induction Oxygen Delivery Method: Circle system utilized Preoxygenation: Pre-oxygenation with 100% oxygen Induction Type: IV induction Ventilation: Mask ventilation without difficulty and Oral airway inserted - appropriate to patient size Laryngoscope Size: Miller and 3 Grade View: Grade I Tube type: Oral Tube size: 8.0 mm Number of attempts: 1 Airway Equipment and Method: Stylet Placement Confirmation: ETT inserted through vocal cords under direct vision,  positive ETCO2 and breath sounds checked- equal and bilateral Secured at: 22 cm Tube secured with: Tape Dental Injury: Teeth and Oropharynx as per pre-operative assessment

## 2019-04-20 NOTE — Discharge Instructions (Signed)
For the first three days, remove your dressing, and tape a piece of saran wrap over your incision. °Take your shower, then remove the saran wrap and put a clean dressing on. °After three days you can shower without the saran wrap.  °No lifting or excessive bending  °No driving while taking pain medications.  °Call Dr. Gioffre if any wound complications or temperature of 101 degrees F or over.  °Call the office for an appointment to see Dr. Gioffre in two weeks: [phone] and ask for Dr. Gioffre's nurse, Tammy Johnson.  °

## 2019-04-20 NOTE — Interval H&P Note (Signed)
History and Physical Interval Note:  04/20/2019 12:10 PM  James [name]  has presented today for surgery, with the diagnosis of herniated nucleus polposus L5-S1 left lumbar.  The various methods of treatment have been discussed with the patient and family. After consideration of risks, benefits and other options for treatment, the patient has consented to  Procedure(s) with comments: Lumbar microdisectomy and decompression L5-S1 left (N/A) - 37min as a surgical intervention.  The patient's history has been reviewed, patient examined, no change in status, stable for surgery.  I have reviewed the patient's chart and labs.  Questions were answered to the patient's satisfaction.     Latanya Maudlin

## 2019-04-21 DIAGNOSIS — K219 Gastro-esophageal reflux disease without esophagitis: Secondary | ICD-10-CM | POA: Diagnosis not present

## 2019-04-21 DIAGNOSIS — Z87891 Personal history of nicotine dependence: Secondary | ICD-10-CM | POA: Diagnosis not present

## 2019-04-21 DIAGNOSIS — F419 Anxiety disorder, unspecified: Secondary | ICD-10-CM | POA: Diagnosis not present

## 2019-04-21 DIAGNOSIS — M48062 Spinal stenosis, lumbar region with neurogenic claudication: Secondary | ICD-10-CM

## 2019-04-21 DIAGNOSIS — T383X5A Adverse effect of insulin and oral hypoglycemic [antidiabetic] drugs, initial encounter: Secondary | ICD-10-CM | POA: Diagnosis not present

## 2019-04-21 DIAGNOSIS — M5117 Intervertebral disc disorders with radiculopathy, lumbosacral region: Secondary | ICD-10-CM | POA: Diagnosis not present

## 2019-04-21 DIAGNOSIS — E160 Drug-induced hypoglycemia without coma: Secondary | ICD-10-CM | POA: Diagnosis not present

## 2019-04-21 DIAGNOSIS — J449 Chronic obstructive pulmonary disease, unspecified: Secondary | ICD-10-CM | POA: Diagnosis not present

## 2019-04-21 DIAGNOSIS — M4807 Spinal stenosis, lumbosacral region: Secondary | ICD-10-CM | POA: Diagnosis not present

## 2019-04-21 DIAGNOSIS — I2510 Atherosclerotic heart disease of native coronary artery without angina pectoris: Secondary | ICD-10-CM | POA: Diagnosis not present

## 2019-04-21 DIAGNOSIS — F329 Major depressive disorder, single episode, unspecified: Secondary | ICD-10-CM | POA: Diagnosis not present

## 2019-04-21 DIAGNOSIS — R569 Unspecified convulsions: Secondary | ICD-10-CM | POA: Diagnosis not present

## 2019-04-21 DIAGNOSIS — E1069 Type 1 diabetes mellitus with other specified complication: Secondary | ICD-10-CM | POA: Diagnosis not present

## 2019-04-21 LAB — GLUCOSE, CAPILLARY: Glucose-Capillary: 158 mg/dL — ABNORMAL HIGH (ref 70–99)

## 2019-04-21 NOTE — Progress Notes (Signed)
PROGRESS NOTE    James [name]  [id number] DOB: July 18, 1956 DOA: 04/20/2019 PCP: [name], MD    Brief Narrative:   63 year old man PMH diabetes mellitus type 1 since age 71, managed with insulin pump for 7 years, underwent elective lumbar microdiscectomy today and was noted to have perioperative hypoglycemia. TRH consulted for recommendations and further evaluation of hypoglycemia.  Patient has been on insulin pump for 7 years and generally is quite well controlled by report.  He has had no issues with his pump and has no difficulty managing it.  He does have occasional lows but knows how to handle these.  They tend to occur when he does not eat properly.  Last evening he took his insulin pump off and fell asleep.  The morning of his surgery, his blood sugar was over 300 so he took 13 units of Humalog.  He thinks this is why he subsequently had hypoglycemia perioperatively with a glucose of 21.  Assessment & Plan:   Active Problems:   Spinal stenosis, lumbar region with neurogenic claudication   Hypoglycemia due to insulin   DM type 1 (diabetes mellitus, type 1) (HCC)   Hypoglycemia, diabetes mellitus type 1 managed with insulin pump Patient with perioperative hypoglycemic event with a glucose of 21 during his spine surgery yesterday.  Etiology likely secondary to insulin injection of 13 units prior to surgery while on n.p.o. status with likely overcorrection that was accidental.  Patient had restarted his insulin pump postoperatively without any further recurrences of hypoglycemic events.  At baseline, his diabetes is well controlled with last hemoglobin A1c 6.5%.  She is currently tolerating diet without issue.  No other issues or concerns at this time.  Okay for discharge home with continued outpatient follow-up.  L5/S1 disc herniation with spinal stenosis Postoperative Justen #1 for L5-S1 lumbar microdiscectomy with decompression.  Patient states leg pain and paresthesias improved.  Continue treatment and plan per orthopedics.   Subjective: Patient seen and examined at bedside, orthopedics Dr. Gladstone Lighter present.  Patient states pain is improving following surgery.  Had restarted his insulin pump postoperatively with no further hypoglycemic events.  Blood sugar this morning 157.  Okay for discharge home per medicine standpoint.  Objective: Vitals:   04/20/19 2053 04/20/19 2212 04/21/19 0119 04/21/19 0548  BP: 120/66 107/64 (!) 118/59 120/64  Pulse: (!) 103 96 96 77  Resp: 16 16 16 16   Temp: 97.8 F (36.6 C) 98.1 F (36.7 C) 98.2 F (36.8 C) 97.8 F (36.6 C)  TempSrc: Oral Oral Oral Oral  SpO2: 97% 97% 98% 98%  Weight:      Height:        Intake/Output Summary (Last 24 hours) at 04/21/2019 0818 Last data filed at 04/21/2019 0741 Gross per 24 hour  Intake 2534.88 ml  Output 1250 ml  Net 1284.88 ml   Filed Weights   04/20/19 1155  Weight: 79.5 kg    Examination:  General exam: Appears calm and comfortable  Respiratory system: Clear to auscultation. Respiratory effort normal. Cardiovascular system: S1 & S2 heard, RRR. No JVD, murmurs, rubs, gallops or clicks. No pedal edema. Gastrointestinal system: Abdomen is nondistended, soft and nontender. No organomegaly or masses felt. Normal bowel sounds heard. Central nervous system: Alert and oriented. No focal neurological deficits. Extremities: Symmetric 5 x 5 power. Skin: No rashes, lesions or ulcers Psychiatry: Judgement and insight appear normal. Mood & affect appropriate.     Data Reviewed: I have personally reviewed following labs and imaging  studies  CBC: Recent Labs  Lab 04/17/19 1358  WBC 6.0  NEUTROABS 3.6  HGB 15.4  HCT 44.1  MCV 91.1  PLT [id number]   Basic Metabolic Panel: Recent Labs  Lab 04/17/19 1358  NA 137  K 4.7  CL 102  CO2 29  GLUCOSE 102*  BUN 15  CREATININE 0.88  CALCIUM 9.3   GFR: Estimated Creatinine Clearance: 84.8 mL/min (by C-G formula based on SCr of 0.88 mg/dL).  Liver Function Tests: Recent Labs  Lab 04/17/19 1358  AST 22  ALT 27  ALKPHOS 79  BILITOT 0.5  PROT 6.5  ALBUMIN 4.2   No results for input(s): LIPASE, AMYLASE in the last 168 hours. No results for input(s): AMMONIA in the last 168 hours. Coagulation Profile: Recent Labs  Lab 04/17/19 1358  INR 0.9   Cardiac Enzymes: No results for input(s): CKTOTAL, CKMB, CKMBINDEX, TROPONINI in the last 168 hours. BNP (last 3 results) No results for input(s): PROBNP in the last 8760 hours. HbA1C: No results for input(s): HGBA1C in the last 72 hours. CBG: Recent Labs  Lab 04/20/19 1530 04/20/19 1609 04/20/19 1839 04/20/19 2217 04/21/19 0721  GLUCAP 89 78 137* 310* 158*   Lipid Profile: No results for input(s): CHOL, HDL, LDLCALC, TRIG, CHOLHDL, LDLDIRECT in the last 72 hours. Thyroid Function Tests: No results for input(s): TSH, T4TOTAL, FREET4, T3FREE, THYROIDAB in the last 72 hours. Anemia Panel: No results for input(s): VITAMINB12, FOLATE, FERRITIN, TIBC, IRON, RETICCTPCT in the last 72 hours. Sepsis Labs: No results for input(s): PROCALCITON, LATICACIDVEN in the last 168 hours.  Recent Results (from the past 240 hour(s))  Novel Coronavirus, NAA (Hosp order, Send-out to Ref Lab; TAT 18-24 hrs     Status: None   Collection Time: 04/17/19 12:32 PM   Specimen: Nasopharyngeal Swab; Respiratory  Result Value Ref Range Status   SARS-CoV-2, NAA NOT DETECTED NOT DETECTED Final    Comment: (NOTE) This nucleic acid amplification test was developed and its performance characteristics determined by [employer]. Nucleic acid amplification tests include PCR and TMA. This test has not been FDA cleared or approved. This test has been authorized by FDA under an Emergency Use Authorization (EUA). This test is only authorized for the duration of time the declaration that circumstances exist justifying the authorization of the emergency use of in vitro diagnostic tests for  detection of SARS-CoV-2 virus and/or diagnosis of COVID-19 infection under section 564(b)(1) of the Act, 21 U.S.C. [id number]) (1), unless the authorization is terminated or revoked sooner. When diagnostic testing is negative, the possibility of a false negative result should be considered in the context of a patient's recent exposures and the presence of clinical signs and symptoms consistent with COVID-19. An individual without symptoms of COVID- 19 and who is not shedding SARS-CoV-2 vi rus would expect to have a negative (not detected) result in this assay. Performed At: [facility] [address] [place], [place] [id number] Rush Farmer MD [id number]    Burna  Final    Comment: Performed at [facility], [place] [address], [place], [place] [address]  Surgical pcr screen     Status: None   Collection Time: 04/17/19  1:58 PM   Specimen: Nasal Mucosa; Nasal Swab  Result Value Ref Range Status   MRSA, PCR NEGATIVE NEGATIVE Final   Staphylococcus aureus NEGATIVE NEGATIVE Final    Comment: (NOTE) The Xpert SA Assay (FDA approved for NASAL specimens in patients 69 years of age and older),  is one component of a comprehensive surveillance program. It is not intended to diagnose infection nor to guide or monitor treatment. Performed at [facility], [place] [address], [place], [place] [address]          Radiology Studies: Dg Lumbar Spine 2-3 Views  Addendum Date: 04/20/2019   ADDENDUM REPORT: 04/20/2019 12:35 ADDENDUM: Spine labeling was provided. Electronically Signed   By: Zetta Bills M.D.   On: 04/20/2019 12:35   Result Date: 04/20/2019 CLINICAL DATA:  Lumbar laminectomy, preoperative evaluation. EXAM: LUMBAR SPINE - 2-3 VIEW COMPARISON:  Studies dating back to April of 2013 FINDINGS: No signs of acute fracture or subluxation. Spinal degenerative changes are evident worse at L4-5 and L5-S1 with facet disease at  L5-S1. Dense atherosclerotic calcifications in the abdominal aorta. IMPRESSION: No acute findings with signs of degenerative change worse than in 2015. Electronically Signed: By: Zetta Bills M.D. On: 04/20/2019 12:13   Dg Spine Portable 1 View  Result Date: 04/20/2019 CLINICAL DATA:  Intraoperative localization film for patient undergoing L5-S1 surgery. EXAM: PORTABLE SPINE - 1 VIEW COMPARISON:  Localization film earlier today. FINDINGS: Probe is at the level of the L5-S1 interspace. New clamp is in place on the L5 and likely S1 spinous processes. IMPRESSION: As above. Electronically Signed   By: Inge Rise M.D.   On: 04/20/2019 14:28   Dg Spine Portable 1 View  Result Date: 04/20/2019 CLINICAL DATA:  Lumbar laminectomy EXAM: PORTABLE SPINE - 1 VIEW COMPARISON:  Lumbar spine radiographs from earlier in the same [place] FINDINGS: A single cross-table lateral view demonstrates surgical hardware overlying the L5 and S1 posterior elements. The alignment is normal. Vertebral body heights are normal. IMPRESSION: Intraoperative localization as described. Electronically Signed   By: Zerita Boers M.D.   On: 04/20/2019 14:08   Dg Spine Portable 1 View  Result Date: 04/20/2019 CLINICAL DATA:  Lumbar laminectomy. EXAM: PORTABLE SPINE - 1 VIEW COMPARISON:  Lumbar spine radiographs earlier same date. FINDINGS: 1338 hours. Cross-table lateral view demonstrates posterior localizing needles adjacent to the L5 spinous process and posterior to the upper sacrum. The alignment is normal. IMPRESSION: Intraoperative localization as described. Electronically Signed   By: Richardean Sale M.D.   On: 04/20/2019 13:48        Scheduled Meds: . bupivacaine liposome  20 mL Infiltration Once  . fluticasone furoate-vilanterol  1 puff Inhalation Daily  . traZODone  150 mg Oral QHS  . varenicline  1 mg Oral BID   Continuous Infusions: . sodium chloride 50 mL/hr at 04/20/19 2235  .  ceFAZolin (ANCEF) IV 1 g (04/21/19  0532)  . lactated ringers Stopped (04/21/19 0741)  . methocarbamol (ROBAXIN) IV 500 mg (04/20/19 1609)     LOS: 0 days    Time spent: 31 minutes spent on chart review, discussion with nursing staff, consultants, updating family and interview/physical exam; more than 50% of that time was spent in counseling and/or coordination of care.    Chi Woodham J [place] DO [facility] Pager [phone]  If 7PM-7AM, please contact night-coverage [email or web address] Password [email or web address] 04/21/2019, 8:18 AM

## 2019-04-21 NOTE — Op Note (Signed)
NAME: James [name], LAPPE MEDICAL RECORD [id number] ACCOUNT [id number] DATE OF BIRTH:21-Mar-1956 FACILITY: [facility] LOCATION: [id number] PHYSICIAN:Ladamien Rammel A. Damien Cisar, MD  OPERATIVE REPORT  DATE OF PROCEDURE:  04/20/2019  PREOPERATIVE DIAGNOSES: 1.  Spinal stenosis L5-S1. 2.  Foraminal stenosis involving the L5 nerve root on the left. 3.  Foraminal stenosis involving the S1 nerve root on the left. 4.  Herniated lumbar disk at L5-S1 on the left.  All his pain was on the left lower extremity.  POSTOPERATIVE DIAGNOSES: 1.  Spinal stenosis L5-S1. 2.  Foraminal stenosis involving the L5 nerve root on the left. 3.  Foraminal stenosis involving the S1 nerve root on the left. 4.  Herniated lumbar disk at L5-S1 on the left.  All his pain was on the left lower extremity.  OPERATION: 1.  Central decompressive lumbar laminectomy for spinal stenosis, L5-S1. 2.  Foraminotomy for the L5 root on the left. 3.  Foraminotomy for the S1 root on the left. 4.  Microdiskectomy L5-S1 on the left.  DESCRIPTION OF PROCEDURE:  Under general anesthesia, routine orthopedic prep and draping was carried out with the patient on a Wilson frame.  At this time, the appropriate timeout was called.  Earlier in the holding area, I marked the left side of his  back even though we were going central.  At this time, 2 needles were placed in the back for localization purposes.  X-rays were taken.  An incision then was made over the L5-S1 interspace and was extended proximally and distally.  At this time, the  muscle was stripped from the lamina and spinous processes bilaterally.  Another x-ray was taken with a Kocher clamp and the [place] for the x-ray.  Following that, we then began our central decompressive lumbar laminectomy at L5-S1.  Note the  canal was quite tight.  He had extremely tight lateral recess on the left.  I first identified the ligamentum flavum.  I protected the dura and then went out laterally first and  decompressed the lateral recess.  Once this was done, the microscope was  brought in.  I then removed the ligamentum flavum in the usual fashion.  I easily identified the S1 root.  It was quite large.  I gently retracted that.  I cauterized the lateral recess veins with a bipolar.  Once we had good hemostasis, I then gently  retracted the dura and the S1 root with a D'Errico retractor.  I then identified the L5-S1 disk space by putting a spinal needle in.  Note, prior to this, another x-ray was taken to verify the space.  At this time, an incision was made in the posterior  longitudinal ligament.  I removed a portion of that.  I went down and utilized the Epstein curettes and decompressed the central part of the disk into the canal.  I then did a microdiskectomy.  I went out laterally as well.  I then utilized a nerve hook  to make sure there were no other loose fragments noted.  At that time, the nerve now was extremely easily movable.  As I mentioned, it was quite swollen.  I utilized the hockey stick and went down and did a foraminotomy for the S1 root and proximally for  the L5 root.  Once the foraminotomies were carried out and a microdiskectomy was completed, I was able to easily take a hockey stick and go out the foramina.  The root now was freely movable.  The dura was freely movable as well.  There was a sufficient  amount of fatty material, which was epidural fat.  I left that in place.  We thoroughly irrigated out the area several times.  We then loosely applied some thrombin-soaked Gelfoam and closed the wound in layers in the usual fashion except I left a small  distal deep and proximal part open for drainage purposes.  He will be admitted to the hospital overnight for observation.  There was an issue with his blood sugar during the procedure, but we controlled that.  LN/NUANCE  D:04/20/2019 T:04/21/2019 JOB:[id number]

## 2019-04-21 NOTE — Progress Notes (Signed)
Subjective: 1 James [name] Post-Op Procedure(s) (LRB): Lumbar microdisectomy and decompression L5-S1 left (N/A) Patient reports pain as 2 on 0-10 scale.  His left leg pain is less this morning.His Blood Sugar is now under control. I spoke with his Hospitalist and we both agree that he is ready for DC.  Objective: Vital signs in last 24 hours: Temp:  [97.6 F (36.4 C)-98.2 F (36.8 C)] 97.8 F (36.6 C) (10/03 0548) Pulse Rate:  [77-103] 77 (10/03 0548) Resp:  [11-18] 16 (10/03 0548) BP: (107-157)/(59-93) 120/64 (10/03 0548) SpO2:  [97 %-100 %] 98 % (10/03 0548) Weight:  [79.5 kg] 79.5 kg (10/02 1155)  Intake/Output from previous [place]: 10/02 0701 - 10/03 0700 In: 2216.9 [I.V.:2016.9; IV Piggyback:200] Out: 500 [Urine:450; Blood:50] Intake/Output this shift: Total I/O In: 318 [I.V.:318] Out: 750 [Urine:750]  No results for input(s): HGB in the last 72 hours. No results for input(s): WBC, RBC, HCT, PLT in the last 72 hours. No results for input(s): NA, K, CL, CO2, BUN, CREATININE, GLUCOSE, CALCIUM in the last 72 hours. No results for input(s): LABPT, INR in the last 72 hours.  Neurologically intact Dorsiflexion/Plantar flexion intact   Assessment/Plan: 1 Marker Post-Op Procedure(s) (LRB): Lumbar microdisectomy and decompression L5-S1 left (N/A) Up with therapy.Will DC after PT.      Latanya Maudlin 04/21/2019, 8:18 AM

## 2019-04-21 NOTE — Evaluation (Signed)
Physical Therapy Evaluation Patient Details Name: James [name] MRN: [id number] DOB: 05-Aug-1955 Today's Date: 04/21/2019   History of Present Illness  decompression L4/5 left  Clinical Impression  The patient has hiccups. Patient instructed in back precautions, reviewed  Handout. Patient ambulated without need for RW. Encouraged ambulation at home, change position frequently. No further  PT needs. TO Dc to home.    Follow Up Recommendations No PT follow up    Equipment Recommendations  None recommended by PT    Recommendations for Other Services       Precautions / Restrictions Precautions Precautions: Back Precaution Booklet Issued: Yes (comment) Precaution Comments: handout reviewed.      Mobility  Bed Mobility Overal bed mobility: Needs Assistance Bed Mobility: Rolling;Sidelying to Sit Rolling: Supervision Sidelying to sit: Supervision       General bed mobility comments: cues for technique  Transfers Overall transfer level: Needs assistance Equipment used: None Transfers: Sit to/from Stand Sit to Stand: Supervision         General transfer comment: cues for hand placement  Ambulation/Gait Ambulation/Gait assistance: Min guard Gait Distance (Feet): 300 Feet Assistive device: Rolling walker (2 wheeled);None Gait Pattern/deviations: Step-to pattern;Step-through pattern;Drifts right/left Gait velocity: decr   General Gait Details: ambulated x 100' w/ RW and 200' with out. Guarded gait,improved with distance  Stairs            Wheelchair Mobility    Modified Rankin (Stroke Patients Only)       Balance Overall balance assessment: Mild deficits observed, not formally tested(due to guarding  trunk)                                           Pertinent Vitals/Pain Pain Assessment: Faces Pain Score: 6  Pain Location: back of left knee Pain Descriptors / Indicators: Aching;Pressure Pain Intervention(s): Monitored during  session;Premedicated before session    Home Living Family/patient expects to be discharged to:: Private residence Living Arrangements: Spouse/significant other Available Help at Discharge: Family Type of Home: House Home Access: Stairs to enter   Technical brewer of Steps: 1 Home Layout: One level Home Equipment: None      Prior Function Level of Independence: Independent               Hand Dominance        Extremity/Trunk Assessment   Upper Extremity Assessment Upper Extremity Assessment: Overall WFL for tasks assessed    Lower Extremity Assessment Lower Extremity Assessment: LLE deficits/detail LLE Deficits / Details: wfl       Communication   Communication: No difficulties  Cognition Arousal/Alertness: Awake/alert Behavior During Therapy: WFL for tasks assessed/performed Overall Cognitive Status: Within Functional Limits for tasks assessed                                        General Comments      Exercises     Assessment/Plan    PT Assessment Patent does not need any further PT services  PT Problem List         PT Treatment Interventions      PT Goals (Current goals can be found in the Care Plan section)  Acute Rehab PT Goals Patient Stated Goal: go home, not be in pain PT Goal Formulation: Patient unable to participate  in goal setting    Frequency     Barriers to discharge        Co-evaluation               AM-PAC PT "6 Clicks" Mobility  Outcome Measure Help needed turning from your back to your side while in a flat bed without using bedrails?: None Help needed moving from lying on your back to sitting on the side of a flat bed without using bedrails?: None Help needed moving to and from a bed to a chair (including a wheelchair)?: None Help needed standing up from a chair using your arms (e.g., wheelchair or bedside chair)?: None Help needed to walk in hospital room?: None Help needed climbing 3-5  steps with a railing? : A Little 6 Click Score: 23    End of Session   Activity Tolerance: Patient tolerated treatment well Patient left: in chair;with call bell/phone within reach Nurse Communication: Mobility status PT Visit Diagnosis: Difficulty in walking, not elsewhere classified (R26.2);Pain    Time: [id number] PT Time Calculation (min) (ACUTE ONLY): 24 min   Charges:   PT Evaluation $PT Eval Low Complexity: 1 Low PT Treatments $Gait Training: 8-22 mins        Tresa Endo PT Acute Rehabilitation Services Pager [phone] Office [phone]   Claretha Cooper 04/21/2019, 9:21 AM

## 2019-04-21 NOTE — Progress Notes (Signed)
Pt was provided with d/c instructions. After discussing the pt's plan of care upon d/c home, the pt reported no further questions or concerns.  

## 2019-04-21 NOTE — Plan of Care (Signed)

## 2019-04-23 ENCOUNTER — Encounter (HOSPITAL_COMMUNITY): Payer: Self-pay | Admitting: Orthopedic Surgery

## 2019-04-23 ENCOUNTER — Encounter (INDEPENDENT_AMBULATORY_CARE_PROVIDER_SITE_OTHER): Payer: 59 | Admitting: Ophthalmology

## 2019-04-23 LAB — GLUCOSE, CAPILLARY: Glucose-Capillary: 24 mg/dL — CL (ref 70–99)

## 2019-04-23 LAB — SURGICAL PATHOLOGY

## 2019-04-23 NOTE — Discharge Summary (Addendum)
Physician Discharge Summary   Patient ID: James [name] MRN: [id number] DOB/AGE: 1955-12-27 63 y.o.  Admit date: 04/20/2019 Discharge date: 04/23/2019  Primary Diagnosis:  Herniated nucleus polposus L5-S1 left lumbar  Admission Diagnoses:  Past Medical History:  Diagnosis Date  . ANXIETY 04/03/2007  . Anxiety   . ASTHMA 09/06/2008  . Asthma   . ASTHMATIC BRONCHITIS, ACUTE 10/25/2008  . Bladder neck obstruction   . CARPAL TUNNEL SYNDROME, BILATERAL 07/31/2007   issues resolved, no surgery  . Cervical disc disease   . Chronic bronchitis ([place])    "get it about q yr" (02/12/2014)  . COPD (chronic obstructive pulmonary disease) ([place])   . CORONARY ARTERY DISEASE 04/03/2007  . DEPRESSION 04/03/2007  . Depression   . DIABETES MELLITUS, TYPE I 04/03/2007  . Diabetic retinopathy associated with diabetes mellitus due to underlying condition ([place]) 04/03/2007  . DM W/EYE MANIFESTATIONS, TYPE I, UNCONTROLLED 04/04/2007  . 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 ED (erectile dysfunction)   . History of kidney stones   . HYPERLIPIDEMIA 04/04/2007  . Pneumonia    "several times and again today" (02/13/2104)  . Renal insufficiency   . Seizures ([place])    "insulin seizure from time to time; none in the last couple years" (02/12/2014)  . Spinal stenosis    Discharge Diagnoses:   Active Problems:   Spinal stenosis, lumbar region with neurogenic claudication   Hypoglycemia due to insulin   DM type 1 (diabetes mellitus, type 1) ([place])  Procedure:  Procedure(s) (LRB): Lumbar microdisectomy and decompression L5-S1 left (N/A)   Consults: None  HPI: James [name] presented to the office for low back pain after incorrectly lifting a mower into his truck. He was leaning forward to get his arm around it and lifted with his back 5 times. He notes that the last time he lifted the mower down he noticed increased pain. No radiation of pain into his legs at first but is now having pain into the left leg. No  numbness or tingling into his legs. He states his pain is worse on the left. He is taking Advil right now, his PCP gave him methocarbamol which does not help. He received an injection by his PCP that had Toradol in it, it helped some.  He is diabetic but has an insulin pump. No change in bladder or bowel function. He did not improve with oral prednisone. MRI showed a disc herniation at L5-S1 on the left.       Laboratory Data: Hospital Outpatient Visit on 04/17/2019  Component Date Value Ref Range Status  . Glucose-Capillary 04/17/2019 110* 70 - 99 mg/dL Final  . aPTT 04/17/2019 26  24 - 36 seconds Final   Performed at [facility], [place] [address] [place], [place] [address]  . WBC 04/17/2019 6.0  4.0 - 10.5 K/uL Final  . RBC 04/17/2019 4.84  4.22 - 5.81 MIL/uL Final  . Hemoglobin 04/17/2019 15.4  13.0 - 17.0 g/dL Final  . HCT 04/17/2019 44.1  39.0 - 52.0 % Final  . MCV 04/17/2019 91.1  80.0 - 100.0 fL Final  . MCH 04/17/2019 31.8  26.0 - 34.0 pg Final  . MCHC 04/17/2019 34.9  30.0 - 36.0 g/dL Final  . RDW 04/17/2019 11.9  11.5 - 15.5 % Final  . Platelets 04/17/2019 289  150 - 400 K/uL Final  . nRBC 04/17/2019 0.0  0.0 - 0.2 % Final  . Neutrophils Relative % 04/17/2019 61  % Final  .  Neutro Abs 04/17/2019 3.6  1.7 - 7.7 K/uL Final  . Lymphocytes Relative 04/17/2019 23  % Final  . Lymphs Abs 04/17/2019 1.4  0.7 - 4.0 K/uL Final  . Monocytes Relative 04/17/2019 9  % Final  . Monocytes Absolute 04/17/2019 0.6  0.1 - 1.0 K/uL Final  . Eosinophils Relative 04/17/2019 6  % Final  . Eosinophils Absolute 04/17/2019 0.4  0.0 - 0.5 K/uL Final  . Basophils Relative 04/17/2019 1  % Final  . Basophils Absolute 04/17/2019 0.1  0.0 - 0.1 K/uL Final  . Immature Granulocytes 04/17/2019 0  % Final  . Abs Immature Granulocytes 04/17/2019 0.02  0.00 - 0.07 K/uL Final   Performed at [facility], [place] [address] [place], [place] [address]  . Sodium 04/17/2019 137   135 - 145 mmol/L Final  . Potassium 04/17/2019 4.7  3.5 - 5.1 mmol/L Final  . Chloride 04/17/2019 102  98 - 111 mmol/L Final  . CO2 04/17/2019 29  22 - 32 mmol/L Final  . Glucose, Bld 04/17/2019 102* 70 - 99 mg/dL Final  . BUN 04/17/2019 15  8 - 23 mg/dL Final  . Creatinine, Ser 04/17/2019 0.88  0.61 - 1.24 mg/dL Final  . Calcium 04/17/2019 9.3  8.9 - 10.3 mg/dL Final  . Total Protein 04/17/2019 6.5  6.5 - 8.1 g/dL Final  . Albumin 04/17/2019 4.2  3.5 - 5.0 g/dL Final  . AST 04/17/2019 22  15 - 41 U/L Final  . ALT 04/17/2019 27  0 - 44 U/L Final  . Alkaline Phosphatase 04/17/2019 79  38 - 126 U/L Final  . Total Bilirubin 04/17/2019 0.5  0.3 - 1.2 mg/dL Final  . GFR calc non Af Amer 04/17/2019 >60  >60 mL/min Final  . GFR calc Af Amer 04/17/2019 >60  >60 mL/min Final  . Anion gap 04/17/2019 6  5 - 15 Final   Performed at [facility], [place] [address] [place], [place] [address]  . Prothrombin Time 04/17/2019 12.4  11.4 - 15.2 seconds Final  . INR 04/17/2019 0.9  0.8 - 1.2 Final   Comment: (NOTE) INR goal varies based on device and disease states. Performed at [facility], [place] [address], [place], [place] [address]   . ABO/RH(D) 04/17/2019 O POS   Final  . Antibody Screen 04/17/2019 NEG   Final  . Sample Expiration 04/17/2019 04/23/2019,2359   Final  . Extend sample reason 04/17/2019    Final                   Value:NO TRANSFUSIONS OR PREGNANCY IN THE PAST 3 MONTHS Performed at [facility], [place] [address] [place], [place] [address]   . Hgb A1c MFr Bld 04/17/2019 6.5* 4.8 - 5.6 % Final   Comment: (NOTE) Pre diabetes:          5.7%-6.4% Diabetes:              >6.4% Glycemic control for   <7.0% adults with diabetes   . Mean Plasma Glucose 04/17/2019 139.85  mg/dL Final   Performed at [place] [address] [place], [place] [address]  . MRSA, PCR 04/17/2019 NEGATIVE  NEGATIVE Final  . Staphylococcus aureus  04/17/2019 NEGATIVE  NEGATIVE Final   Comment: (NOTE) The Xpert SA Assay (FDA approved for NASAL specimens in patients 22 years of age and older), is one component of a comprehensive surveillance program. It is not intended to  diagnose infection nor to guide or monitor treatment. Performed at [facility], [place] [address] [place], [place] [address]   . ABO/RH(D) 04/17/2019    Final                   Value:O POS Performed at [facility], [place] [address], [place], [place] [address]    X-Rays:Dg Lumbar Spine 2-3 Views  Addendum Date: 04/20/2019   ADDENDUM REPORT: 04/20/2019 12:35 ADDENDUM: Spine labeling was provided. Electronically Signed   By: Zetta Bills M.D.   On: 04/20/2019 12:35   Result Date: 04/20/2019 CLINICAL DATA:  Lumbar laminectomy, preoperative evaluation. EXAM: LUMBAR SPINE - 2-3 VIEW COMPARISON:  Studies dating back to April of 2013 FINDINGS: No signs of acute fracture or subluxation. Spinal degenerative changes are evident worse at L4-5 and L5-S1 with facet disease at L5-S1. Dense atherosclerotic calcifications in the abdominal aorta. IMPRESSION: No acute findings with signs of degenerative change worse than in 2015. Electronically Signed: By: Zetta Bills M.D. On: 04/20/2019 12:13   Dg Spine Portable 1 View  Result Date: 04/20/2019 CLINICAL DATA:  Intraoperative localization film for patient undergoing L5-S1 surgery. EXAM: PORTABLE SPINE - 1 VIEW COMPARISON:  Localization film earlier today. FINDINGS: Probe is at the level of the L5-S1 interspace. New clamp is in place on the L5 and likely S1 spinous processes. IMPRESSION: As above. Electronically Signed   By: Inge Rise M.D.   On: 04/20/2019 14:28   Dg Spine Portable 1 View  Result Date: 04/20/2019 CLINICAL DATA:  Lumbar laminectomy EXAM: PORTABLE SPINE - 1 VIEW COMPARISON:  Lumbar spine radiographs from earlier in the same [place] FINDINGS: A single cross-table lateral view  demonstrates surgical hardware overlying the L5 and S1 posterior elements. The alignment is normal. Vertebral body heights are normal. IMPRESSION: Intraoperative localization as described. Electronically Signed   By: Zerita Boers M.D.   On: 04/20/2019 14:08   Dg Spine Portable 1 View  Result Date: 04/20/2019 CLINICAL DATA:  Lumbar laminectomy. EXAM: PORTABLE SPINE - 1 VIEW COMPARISON:  Lumbar spine radiographs earlier same date. FINDINGS: 1338 hours. Cross-table lateral view demonstrates posterior localizing needles adjacent to the L5 spinous process and posterior to the upper sacrum. The alignment is normal. IMPRESSION: Intraoperative localization as described. Electronically Signed   By: Richardean Sale M.D.   On: 04/20/2019 13:48    EKG: Orders placed or performed during the hospital encounter of 04/17/19  . EKG  . EKG  . EKG 12 lead  . EKG 12 lead     Hospital Course: Patient was admitted to [facility] and taken to the OR and underwent the above state procedure without complications.  Patient tolerated the procedure well and was later transferred to the recovery room and then to the orthopaedic floor for postoperative care.  They were given PO and IV analgesics for pain control following their surgery.  They were given 24 hours of postoperative antibiotics. Required medicine consult for regulation of blood glucose and insulin dosage.  PT was consulted postop to assist with mobility and transfers.  The patient was allowed to be WBAT with therapy and was taught back precautions. Discharge planning was consulted to help with postop disposition and equipment needs.  Patient had a fair night on the evening of surgery and started to get up OOB with therapy on [place] one. Patient was seen in rounds and was ready to go home on [place] one.  They were given discharge instructions  and dressing directions.  They were instructed on when to follow up in the office with Dr.Gioffre   Diet: Cardiac diet  and Diabetic diet Activity:WBAT Follow-up:in 2 weeks Disposition - Home Discharged Condition: stable   Discharge Instructions    Call MD / Call 911   Complete by: As directed    If you experience chest pain or shortness of breath, CALL 911 and be transported to the hospital emergency room.  If you develope a fever above 101 F, pus (white drainage) or increased drainage or redness at the wound, or calf pain, call your surgeon's office.   Constipation Prevention   Complete by: As directed    Drink plenty of fluids.  Prune juice may be helpful.  You may use a stool softener, such as Colace (over the counter) 100 mg twice a [place].  Use MiraLax (over the counter) for constipation as needed.   Diet - low sodium heart healthy   Complete by: As directed    Diet Carb Modified   Complete by: As directed    Discharge instructions   Complete by: As directed    For the first three days, remove your dressing, and tape a piece of saran wrap over your incision. Take your shower, then remove the saran wrap and put a clean dressing on. After three days you can shower without the saran wrap.  No lifting or excessive bending No driving while taking pain medications Call Dr. Gladstone Lighter if any wound complications or temperature of 101 degrees F or over.  Call the office for an appointment to see Dr. Gladstone Lighter in two weeks: [phone] and ask for Dr. Charlestine Night nurse, Brunilda Payor.   Increase activity slowly as tolerated   Complete by: As directed      Allergies as of 04/21/2019      Reactions   Augmentin [amoxicillin-pot Clavulanate] Nausea And Vomiting, Other (See Comments)   Did it involve swelling of the face/tongue/throat, SOB, or low BP? No Did it involve sudden or severe rash/hives, skin peeling, or any reaction on the inside of your mouth or nose? No Did you need to seek medical attention at a hospital or doctor's office? No When did it last happen?5+ years If all above answers are "NO", may  proceed with cephalosporin use.   Iohexol Other (See Comments)   Dizziness/sweating    Ivp Dye [iodinated Diagnostic Agents] Other (See Comments)   Dizziness/sweating ALSO   Lexapro [escitalopram Oxalate] Itching      Medication List    TAKE these medications   acetaminophen 500 MG tablet Commonly known as: TYLENOL Take 1,000 mg by mouth every 6 (six) hours as needed for moderate pain or headache.   AIRBORNE PO Take 1 tablet by mouth 3 (three) times daily.   bismuth subsalicylate [id number] suspension Commonly known as: PEPTO BISMOL Take 30 mLs by mouth every 6 (six) hours as needed for indigestion or diarrhea or loose stools.   budesonide-formoterol 160-4.5 MCG/ACT inhaler Commonly known as: Symbicort INHALE 2 PUFFS INTO THE LUNGS 2 (TWO) TIMES DAILY. What changed:   how much to take  how to take this  when to take this  additional instructions   cyclobenzaprine 10 MG tablet Commonly known as: FLEXERIL Take 1 tablet (10 mg total) by mouth 3 (three) times daily as needed for muscle spasms.   ibuprofen 200 MG tablet Commonly known as: ADVIL Take 400-800 mg by mouth every 8 (eight) hours as needed for headache or moderate pain.  insulin lispro 100 UNIT/ML injection Commonly known as: HUMALOG Used in insulin pump   insulin pump Soln Inject into the skin continuous. Humalog   ondansetron 4 MG tablet Commonly known as: ZOFRAN Take 1 tablet (4 mg total) by mouth every 6 (six) hours as needed for nausea or vomiting.   oxyCODONE-acetaminophen 5-325 MG tablet Commonly known as: Percocet Take 1 tablet by mouth every 6 (six) hours as needed for severe pain. What changed: when to take this   simvastatin 40 MG tablet Commonly known as: ZOCOR Take 40 mg by mouth at bedtime.   traZODone 150 MG tablet Commonly known as: DESYREL Take 150 mg by mouth at bedtime.   varenicline 1 MG tablet Commonly known as: CHANTIX Take 1 mg by mouth 2 (two) times daily.    Ventolin HFA 108 (90 Base) MCG/ACT inhaler Generic drug: albuterol INHALE 2 PUFFS BY MOUTH INTO THE LUNGS EVERY 6 HOURS AS NEEDED FOR WHEEZING OR SHORTNESS OF BREATH. What changed: See the new instructions.   albuterol (2.5 MG/3ML) 0.083% nebulizer solution Commonly known as: PROVENTIL Take 3 mLs (2.5 mg total) by nebulization every 6 (six) hours as needed for wheezing or shortness of breath. What changed: Another medication with the same name was changed. Make sure you understand how and when to take each.      Follow-up Information    Latanya Maudlin, MD. Schedule an appointment as soon as possible for a visit in 2 week(s).   Specialty: Orthopedic Surgery Contact information: [address] [place] [address] [id number]           Signed: Ardeen Jourdain, PA-C Orthopaedic Surgery 04/23/2019, 6:59 AM

## 2019-04-23 NOTE — Anesthesia Postprocedure Evaluation (Signed)
Anesthesia Post Note  Patient: James [name]  Procedure(s) Performed: Lumbar microdisectomy and decompression L5-S1 left (N/A )     Patient location during evaluation: PACU Anesthesia Type: General Level of consciousness: awake and alert Pain management: pain level controlled Vital Signs Assessment: post-procedure vital signs reviewed and stable Respiratory status: spontaneous breathing, nonlabored ventilation and respiratory function stable Cardiovascular status: blood pressure returned to baseline and stable Postop Assessment: no apparent nausea or vomiting Anesthetic complications: no    Last Vitals:  Vitals:   04/21/19 0119 04/21/19 0548  BP: (!) 118/59 120/64  Pulse: 96 77  Resp: 16 16  Temp: 36.8 C 36.6 C  SpO2: 98% 98%    Last Pain:  Vitals:   04/21/19 0842  TempSrc:   PainSc: 3                  [name]

## 2019-04-26 DIAGNOSIS — E1065 Type 1 diabetes mellitus with hyperglycemia: Secondary | ICD-10-CM | POA: Diagnosis not present

## 2019-05-03 MED FILL — OXYCODONE-ACETAMINOPHEN 5-3: 5-325 | 10 days supply | Qty: 40 | Fill #0

## 2019-05-07 MED FILL — CONTOUR NEXT STRIPS: 25 days supply | Qty: 200 | Fill #5

## 2019-05-15 MED FILL — traMADol HCL 50 MG TABS: 50 | 10 days supply | Qty: 40 | Fill #0

## 2019-05-16 ENCOUNTER — Other Ambulatory Visit: Payer: Self-pay

## 2019-05-16 ENCOUNTER — Encounter: Payer: Self-pay | Admitting: Endocrinology

## 2019-05-16 ENCOUNTER — Other Ambulatory Visit (HOSPITAL_COMMUNITY): Payer: Self-pay | Admitting: Endocrinology

## 2019-05-16 ENCOUNTER — Other Ambulatory Visit: Payer: Self-pay | Admitting: Endocrinology

## 2019-05-16 ENCOUNTER — Ambulatory Visit (HOSPITAL_COMMUNITY)
Admission: RE | Admit: 2019-05-16 | Discharge: 2019-05-16 | Disposition: A | Discharge status: Home / Self Care | Payer: 59 | Source: Ambulatory Visit | Attending: Endocrinology | Admitting: Endocrinology

## 2019-05-16 DIAGNOSIS — R0600 Dyspnea, unspecified: Secondary | ICD-10-CM

## 2019-05-16 DIAGNOSIS — R0789 Other chest pain: Secondary | ICD-10-CM | POA: Diagnosis not present

## 2019-05-16 DIAGNOSIS — J449 Chronic obstructive pulmonary disease, unspecified: Secondary | ICD-10-CM | POA: Diagnosis not present

## 2019-05-16 DIAGNOSIS — M5136 Other intervertebral disc degeneration, lumbar region: Secondary | ICD-10-CM | POA: Diagnosis not present

## 2019-05-16 DIAGNOSIS — R0602 Shortness of breath: Secondary | ICD-10-CM | POA: Diagnosis not present

## 2019-05-16 MED ORDER — IOHEXOL 350 MG/ML SOLN
50.0000 mL | Freq: Once | INTRAVENOUS | Status: AC | PRN
  Administered 2019-05-16: 12:00:00 50 mL via INTRAVENOUS

## 2019-05-18 MED FILL — GABAPENTIN 300 MG CAPSULE: 300 | 20 days supply | Qty: 60 | Fill #0

## 2019-05-25 MED FILL — SIMVASTATIN 40 MG TABLET: 40 | 90 days supply | Qty: 90 | Fill #1

## 2019-05-28 MED FILL — CONTOUR NEXT STRIPS: 75 days supply | Qty: 600 | Fill #6

## 2019-06-07 DIAGNOSIS — E1065 Type 1 diabetes mellitus with hyperglycemia: Secondary | ICD-10-CM | POA: Diagnosis not present

## 2019-06-11 ENCOUNTER — Other Ambulatory Visit: Payer: Self-pay | Admitting: Pulmonary Disease

## 2019-06-11 ENCOUNTER — Telehealth: Payer: Self-pay | Admitting: Adult Health

## 2019-06-11 MED FILL — ALBUTEROL SULFATE HFA 108 (: 108 (90 BAS | 25 days supply | Qty: 18 | Fill #0

## 2019-06-11 NOTE — Telephone Encounter (Signed)
rx Albuterol sent Pt notified Nothing further needed.

## 2019-06-25 ENCOUNTER — Other Ambulatory Visit: Payer: Self-pay

## 2019-06-25 ENCOUNTER — Encounter: Payer: Self-pay | Admitting: Primary Care

## 2019-06-25 ENCOUNTER — Ambulatory Visit (INDEPENDENT_AMBULATORY_CARE_PROVIDER_SITE_OTHER): Payer: 59 | Admitting: Primary Care

## 2019-06-25 DIAGNOSIS — J45909 Unspecified asthma, uncomplicated: Secondary | ICD-10-CM

## 2019-06-25 MED ORDER — HYDROCODONE-HOMATROPINE 5-1.5 MG/5ML PO SYRP: 5.0000 mL | ORAL_SOLUTION | Freq: Four times a day (QID) | ORAL | 0 refills | Status: DC | PRN

## 2019-06-25 MED ORDER — PREDNISONE 10 MG PO TABS: ORAL_TABLET | ORAL | 0 refills | Status: DC

## 2019-06-25 MED ORDER — DOXYCYCLINE HYCLATE 100 MG PO TABS: 100.0000 mg | ORAL_TABLET | Freq: Two times a day (BID) | ORAL | 0 refills | Status: DC

## 2019-06-25 MED ORDER — MOMETASONE FURO-FORMOTEROL FUM 100-5 MCG/ACT IN AERO: 2.0000 | INHALATION_SPRAY | Freq: Two times a day (BID) | RESPIRATORY_TRACT | 0 refills | Status: DC

## 2019-06-25 MED FILL — DOXYCYCLINE HYCLATE 100 MG: 100 | 7 days supply | Qty: 14 | Fill #0

## 2019-06-25 MED FILL — PROMETHAZINE 25 MG TABLET: 25 | 6 days supply | Qty: 20 | Fill #0

## 2019-06-25 MED FILL — predniSONE 10 MG TABS: 10 | 12 days supply | Qty: 30 | Fill #0

## 2019-06-25 MED FILL — HYDROCODONE-HOMATROPINE SYR: 5-1.5 | 12 days supply | Qty: 240 | Fill #0

## 2019-06-25 NOTE — Patient Instructions (Addendum)
Acute asthmatic bronchitis: - Rx Doxycycline 1 tab twice daily x 7 days - Prednisone taper as prescribed (monitor BS closely) - Hycodan cough syrup 29ml q 6 hours prn (safe precautions reviewed)  - Please call insurance company and find out what ICS/LABA in formulary on plan (let us know and will send in prescription) - For now sample Dulera 100- take two puffs twice daily  - Use albuterol nebulizer scheduled 3-4 times a [place]  Follow-up: 1-2 weeks with Beth NP or sooner if symptoms do not improve/worsen ED

## 2019-06-25 NOTE — Progress Notes (Signed)
Virtual Visit via Telephone Note  I connected with James [name] on 06/25/19 at 10:30 AM EST by telephone and verified that I am speaking with the correct person using two identifiers.  Location: Patient: Home Provider: Office   I discussed the limitations, risks, security and privacy concerns of performing an evaluation and management service by telephone and the availability of in person appointments. I also discussed with the patient that there may be a patient responsible charge related to this service. The patient expressed understanding and agreed to proceed.   History of Present Illness: 63 year old male, former smoker. PMH significant for chronic asthmatic bronchitis, allergic rhinitis, CAD, GERD, DM type 1, HLD. Patient of Dr. Elsworth Soho, last seen by pulmonary NP on 04/11/19 and was doing well. Maintained on Symbicort 160 and prn albuterol.   Asthma/bronchitis exacerbations: April 2020- Pred taper, zpack February 2020- doxycyline   06/25/2019 Patient contacted today for acute televisit. Reports increased cough x 1 week. Associated shortness of breath, wheezing and chest tightness. He ran out of his Symbicort 6 weeks ago, has been using Albuterol rescue inhaler 8-10x [place]. States that he has not filled Symbicort prescription d/t Cost of medication which is approx $70/month. Sounds like he may have been getting some type of patient assistance previously. He is diabetic and has an insulin pump, states that he can take prednisone safely. Experiences some nausea in the morning, unclear reason may be due to PND. No known sick contacts. Denies fever, chills, sweats, chest pain, abdominal pain, vomiting.   Observations/Objective:  - Mild-mod dyspnea with speaking - No audible wheezing or cough  TEST  Spirometry 2012 >> FEV1 1.94 (52%) PFTs 05/2015 ratio 74, FEV1 72%, no BD response, mild restriction  CT sinus 08/2013 neg  Ct chest 01/2014 - left volume lossand consolidation left lower  lobe-rib fractures CT chest 2017-improved aeration left lung,? Mild bronchiectasis  Assessment and Plan:  Acute asthmatic bronchitis: - Increased sob/wheezing with cough x 1 week, ran out of Symbicort 6 weeks ago d/t cost  - Rx Doxycycline 1 tab twice daily x 7 days - Prednisone taper as prescribed (monitor BS closely) - Hycodan cough syrup 35ml q 6 hours prn (safe precautions reviewed)  - Please call insurance company and find out what ICS/LABA in formulary on plan (let us know and will send in prescription) - For now sample Dulera 100- take two puffs twice daily - Advised patient used albuterol nebulizer scheduled q6-8 hours for the next week   Follow Up Instructions:  Televisit in 1-2 weeks with NP    I discussed the assessment and treatment plan with the patient. The patient was provided an opportunity to ask questions and all were answered. The patient agreed with the plan and demonstrated an understanding of the instructions.   The patient was advised to call back or seek an in-person evaluation if the symptoms worsen or if the condition fails to improve as anticipated.  I provided 25 minutes of non-face-to-face time during this encounter.   Martyn Ehrich, NP

## 2019-06-26 ENCOUNTER — Other Ambulatory Visit: Payer: Self-pay | Admitting: Pulmonary Disease

## 2019-06-26 MED FILL — ALBUTEROL 0.083 MG/ML SOLN: (2.5 MG/3ML | 15 days supply | Qty: 180 | Fill #0

## 2019-06-27 ENCOUNTER — Other Ambulatory Visit: Payer: Self-pay

## 2019-06-27 ENCOUNTER — Telehealth: Payer: Self-pay | Admitting: Primary Care

## 2019-06-27 DIAGNOSIS — Z20822 Contact with and (suspected) exposure to covid-19: Secondary | ICD-10-CM

## 2019-06-27 NOTE — Telephone Encounter (Signed)
Theres not much more I can do outpatient. I would recommend ED eval and needs COVID testing/ likey CXR and labs

## 2019-06-27 NOTE — Telephone Encounter (Signed)
Spoke with pt. States that he has not improved since his visit with Beth on 06/25/2019. Reports cough and shortness of breath. Denies chest tightness, wheezing. He is having to use his nebulizer 5-8 times a [place]. Ruthe Mannan seems to be helping some but has not made a significant impact. The cough is so violent that it cuts his air off, per the pt. Pt would like to know if Eustaquio Maize has any other recommendations.  Beth - please advise. Thanks.

## 2019-06-27 NOTE — Telephone Encounter (Signed)
Spoke with pt. He is aware of Beth's response. Stated, "I will take that under advisement." Nothing further was needed at this time.

## 2019-06-28 ENCOUNTER — Emergency Department (HOSPITAL_COMMUNITY): Payer: 59

## 2019-06-28 ENCOUNTER — Inpatient Hospital Stay (HOSPITAL_COMMUNITY)
Admission: EM | Admit: 2019-06-28 | Discharge: 2019-07-08 | DRG: 202 | Disposition: A | Discharge status: Home / Self Care | Payer: 59 | Attending: Internal Medicine | Admitting: Internal Medicine

## 2019-06-28 ENCOUNTER — Other Ambulatory Visit: Payer: Self-pay

## 2019-06-28 ENCOUNTER — Encounter (HOSPITAL_COMMUNITY): Payer: Self-pay

## 2019-06-28 DIAGNOSIS — Z8249 Family history of ischemic heart disease and other diseases of the circulatory system: Secondary | ICD-10-CM

## 2019-06-28 DIAGNOSIS — Z794 Long term (current) use of insulin: Secondary | ICD-10-CM

## 2019-06-28 DIAGNOSIS — Z9842 Cataract extraction status, left eye: Secondary | ICD-10-CM

## 2019-06-28 DIAGNOSIS — Z7951 Long term (current) use of inhaled steroids: Secondary | ICD-10-CM

## 2019-06-28 DIAGNOSIS — F17210 Nicotine dependence, cigarettes, uncomplicated: Secondary | ICD-10-CM | POA: Diagnosis present

## 2019-06-28 DIAGNOSIS — Z9049 Acquired absence of other specified parts of digestive tract: Secondary | ICD-10-CM

## 2019-06-28 DIAGNOSIS — Z20828 Contact with and (suspected) exposure to other viral communicable diseases: Secondary | ICD-10-CM | POA: Diagnosis present

## 2019-06-28 DIAGNOSIS — R0902 Hypoxemia: Secondary | ICD-10-CM | POA: Diagnosis not present

## 2019-06-28 DIAGNOSIS — K219 Gastro-esophageal reflux disease without esophagitis: Secondary | ICD-10-CM | POA: Diagnosis present

## 2019-06-28 DIAGNOSIS — Z9641 Presence of insulin pump (external) (internal): Secondary | ICD-10-CM | POA: Diagnosis present

## 2019-06-28 DIAGNOSIS — Z9089 Acquired absence of other organs: Secondary | ICD-10-CM

## 2019-06-28 DIAGNOSIS — Z0184 Encounter for antibody response examination: Secondary | ICD-10-CM

## 2019-06-28 DIAGNOSIS — R059 Cough, unspecified: Secondary | ICD-10-CM

## 2019-06-28 DIAGNOSIS — E103599 Type 1 diabetes mellitus with proliferative diabetic retinopathy without macular edema, unspecified eye: Secondary | ICD-10-CM | POA: Diagnosis present

## 2019-06-28 DIAGNOSIS — Z888 Allergy status to other drugs, medicaments and biological substances status: Secondary | ICD-10-CM

## 2019-06-28 DIAGNOSIS — R05 Cough: Secondary | ICD-10-CM | POA: Diagnosis not present

## 2019-06-28 DIAGNOSIS — J441 Chronic obstructive pulmonary disease with (acute) exacerbation: Secondary | ICD-10-CM | POA: Diagnosis not present

## 2019-06-28 DIAGNOSIS — J45901 Unspecified asthma with (acute) exacerbation: Secondary | ICD-10-CM | POA: Diagnosis present

## 2019-06-28 DIAGNOSIS — Z8701 Personal history of pneumonia (recurrent): Secondary | ICD-10-CM

## 2019-06-28 DIAGNOSIS — J4541 Moderate persistent asthma with (acute) exacerbation: Secondary | ICD-10-CM | POA: Diagnosis not present

## 2019-06-28 DIAGNOSIS — J9601 Acute respiratory failure with hypoxia: Secondary | ICD-10-CM | POA: Diagnosis present

## 2019-06-28 DIAGNOSIS — Z833 Family history of diabetes mellitus: Secondary | ICD-10-CM

## 2019-06-28 DIAGNOSIS — I2510 Atherosclerotic heart disease of native coronary artery without angina pectoris: Secondary | ICD-10-CM | POA: Diagnosis not present

## 2019-06-28 DIAGNOSIS — Z9841 Cataract extraction status, right eye: Secondary | ICD-10-CM

## 2019-06-28 DIAGNOSIS — Z79899 Other long term (current) drug therapy: Secondary | ICD-10-CM

## 2019-06-28 DIAGNOSIS — F410 Panic disorder [episodic paroxysmal anxiety] without agoraphobia: Secondary | ICD-10-CM | POA: Diagnosis not present

## 2019-06-28 DIAGNOSIS — E785 Hyperlipidemia, unspecified: Secondary | ICD-10-CM | POA: Diagnosis not present

## 2019-06-28 DIAGNOSIS — R0602 Shortness of breath: Secondary | ICD-10-CM

## 2019-06-28 DIAGNOSIS — Z961 Presence of intraocular lens: Secondary | ICD-10-CM | POA: Diagnosis present

## 2019-06-28 DIAGNOSIS — Z823 Family history of stroke: Secondary | ICD-10-CM

## 2019-06-28 DIAGNOSIS — Z880 Allergy status to penicillin: Secondary | ICD-10-CM

## 2019-06-28 DIAGNOSIS — Z981 Arthrodesis status: Secondary | ICD-10-CM

## 2019-06-28 DIAGNOSIS — R000 Tachycardia, unspecified: Secondary | ICD-10-CM | POA: Diagnosis not present

## 2019-06-28 LAB — TROPONIN I (HIGH SENSITIVITY)
Troponin I (High Sensitivity): 2 ng/L (ref <18)
Troponin I (High Sensitivity): 2 ng/L (ref <18)

## 2019-06-28 LAB — RESPIRATORY PANEL BY PCR

## 2019-06-28 LAB — CBC
HCT: 43.1 % (ref 39.0–52.0)
Hemoglobin: 14.6 g/dL (ref 13.0–17.0)
MCH: 32.2 pg (ref 26.0–34.0)
MCHC: 33.9 g/dL (ref 30.0–36.0)
MCV: 94.9 fL (ref 80.0–100.0)
Platelets: 272 10*3/uL (ref 150–400)
RBC: 4.54 MIL/uL (ref 4.22–5.81)
RDW: 12.3 % (ref 11.5–15.5)
WBC: 5.7 10*3/uL (ref 4.0–10.5)
nRBC: 0 % (ref 0.0–0.2)

## 2019-06-28 LAB — CBC WITH DIFFERENTIAL/PLATELET
Abs Immature Granulocytes: 0.03 10*3/uL (ref 0.00–0.07)
Basophils Absolute: 0 10*3/uL (ref 0.0–0.1)
Basophils Relative: 0 %
Eosinophils Absolute: 0 10*3/uL (ref 0.0–0.5)
Eosinophils Relative: 0 %
HCT: 44 % (ref 39.0–52.0)
Hemoglobin: 15.1 g/dL (ref 13.0–17.0)
Immature Granulocytes: 0 %
Lymphocytes Relative: 4 %
Lymphs Abs: 0.4 10*3/uL — ABNORMAL LOW (ref 0.7–4.0)
MCH: 32 pg (ref 26.0–34.0)
MCHC: 34.3 g/dL (ref 30.0–36.0)
MCV: 93.2 fL (ref 80.0–100.0)
Monocytes Absolute: 0.1 10*3/uL (ref 0.1–1.0)
Monocytes Relative: 2 %
Neutro Abs: 8.3 10*3/uL — ABNORMAL HIGH (ref 1.7–7.7)
Neutrophils Relative %: 94 %
Platelets: 253 10*3/uL (ref 150–400)
RBC: 4.72 MIL/uL (ref 4.22–5.81)
RDW: 12.4 % (ref 11.5–15.5)
WBC: 8.8 10*3/uL (ref 4.0–10.5)
nRBC: 0 % (ref 0.0–0.2)

## 2019-06-28 LAB — CREATININE, SERUM
Creatinine, Ser: 0.85 mg/dL (ref 0.61–1.24)
GFR calc Af Amer: >60 mL/min (ref >60)
GFR calc non Af Amer: >60 mL/min (ref >60)

## 2019-06-28 LAB — BRAIN NATRIURETIC PEPTIDE: B Natriuretic Peptide: 46.6 pg/mL (ref 0.0–100.0)

## 2019-06-28 LAB — BASIC METABOLIC PANEL
Anion gap: 9 (ref 5–15)
BUN: 22 mg/dL (ref 8–23)
CO2: 24 mmol/L (ref 22–32)
Calcium: 8.8 mg/dL — ABNORMAL LOW (ref 8.9–10.3)
Chloride: 104 mmol/L (ref 98–111)
Creatinine, Ser: 0.81 mg/dL (ref 0.61–1.24)
GFR calc Af Amer: >60 mL/min (ref >60)
GFR calc non Af Amer: >60 mL/min (ref >60)
Glucose, Bld: 198 mg/dL — ABNORMAL HIGH (ref 70–99)
Potassium: 3.7 mmol/L (ref 3.5–5.1)
Sodium: 137 mmol/L (ref 135–145)

## 2019-06-28 LAB — SARS CORONAVIRUS 2 (TAT 6-24 HRS): SARS Coronavirus 2: NEGATIVE

## 2019-06-28 LAB — D-DIMER, QUANTITATIVE: D-Dimer, Quant: 0.68 ug/mL-FEU — ABNORMAL HIGH (ref 0.00–0.50)

## 2019-06-28 LAB — HIV ANTIBODY (ROUTINE TESTING W REFLEX): HIV Screen 4th Generation wRfx: NONREACTIVE

## 2019-06-28 LAB — POC SARS CORONAVIRUS 2 AG -  ED: SARS Coronavirus 2 Ag: NEGATIVE

## 2019-06-28 LAB — NOVEL CORONAVIRUS, NAA: SARS-CoV-2, NAA: NOT DETECTED

## 2019-06-28 MED ORDER — MOMETASONE FURO-FORMOTEROL FUM 200-5 MCG/ACT IN AERO
2.0000 | INHALATION_SPRAY | Freq: Two times a day (BID) | RESPIRATORY_TRACT | Status: DC
  Administered 2019-06-29 – 2019-07-08 (×19): 2 via RESPIRATORY_TRACT
  Filled 2019-06-28: qty 8.8

## 2019-06-28 MED ORDER — METHYLPREDNISOLONE SODIUM SUCC 125 MG IJ SOLR
60.0000 mg | Freq: Once | INTRAMUSCULAR | Status: AC
  Administered 2019-06-28: 60 mg via INTRAVENOUS
  Filled 2019-06-28: qty 2

## 2019-06-28 MED ORDER — ONDANSETRON HCL 4 MG PO TABS: 4.0000 mg | ORAL_TABLET | Freq: Four times a day (QID) | ORAL | Status: DC | PRN

## 2019-06-28 MED ORDER — GUAIFENESIN-CODEINE 100-10 MG/5ML PO SOLN
5.0000 mL | Freq: Once | ORAL | Status: AC
  Administered 2019-06-28: 19:00:00 5 mL via ORAL
  Filled 2019-06-28: qty 5

## 2019-06-28 MED ORDER — ACETAMINOPHEN 325 MG PO TABS: 650.0000 mg | ORAL_TABLET | Freq: Four times a day (QID) | ORAL | Status: DC | PRN

## 2019-06-28 MED ORDER — OXYCODONE-ACETAMINOPHEN 5-325 MG PO TABS: 1.0000 | ORAL_TABLET | Freq: Four times a day (QID) | ORAL | Status: DC | PRN

## 2019-06-28 MED ORDER — SENNOSIDES-DOCUSATE SODIUM 8.6-50 MG PO TABS: 1.0000 | ORAL_TABLET | Freq: Every evening | ORAL | Status: DC | PRN

## 2019-06-28 MED ORDER — LORAZEPAM 0.5 MG PO TABS
0.5000 mg | ORAL_TABLET | Freq: Once | ORAL | Status: AC | PRN
  Administered 2019-06-28: 23:00:00 0.5 mg via ORAL
  Filled 2019-06-28: qty 1

## 2019-06-28 MED ORDER — ACETAMINOPHEN 650 MG RE SUPP: 650.0000 mg | Freq: Four times a day (QID) | RECTAL | Status: DC | PRN

## 2019-06-28 MED ORDER — MAGNESIUM SULFATE 2 GM/50ML IV SOLN
2.0000 g | Freq: Once | INTRAVENOUS | Status: AC
  Administered 2019-06-28: 12:00:00 2 g via INTRAVENOUS
  Filled 2019-06-28: qty 50

## 2019-06-28 MED ORDER — DOXYCYCLINE HYCLATE 100 MG PO TABS
100.0000 mg | ORAL_TABLET | Freq: Two times a day (BID) | ORAL | Status: DC
  Administered 2019-06-28 – 2019-07-03 (×10): 100 mg via ORAL
  Filled 2019-06-28 (×10): qty 1

## 2019-06-28 MED ORDER — IPRATROPIUM-ALBUTEROL 0.5-2.5 (3) MG/3ML IN SOLN
3.0000 mL | Freq: Three times a day (TID) | RESPIRATORY_TRACT | Status: DC | PRN
  Administered 2019-06-28 – 2019-06-29 (×4): 3 mL via RESPIRATORY_TRACT
  Filled 2019-06-28: qty 6
  Filled 2019-06-28: qty 3

## 2019-06-28 MED ORDER — BENZONATATE 100 MG PO CAPS
100.0000 mg | ORAL_CAPSULE | Freq: Two times a day (BID) | ORAL | Status: DC | PRN
  Administered 2019-06-28: 100 mg via ORAL
  Filled 2019-06-28: qty 1

## 2019-06-28 MED ORDER — ALBUTEROL SULFATE HFA 108 (90 BASE) MCG/ACT IN AERS
4.0000 | INHALATION_SPRAY | Freq: Once | RESPIRATORY_TRACT | Status: AC
  Administered 2019-06-28: 4 via RESPIRATORY_TRACT
  Filled 2019-06-28: qty 6.7

## 2019-06-28 MED ORDER — FLUTICASONE PROPIONATE 50 MCG/ACT NA SUSP
2.0000 | Freq: Every day | NASAL | Status: DC
  Administered 2019-06-28 – 2019-07-08 (×11): 2 via NASAL
  Filled 2019-06-28: qty 16

## 2019-06-28 MED ORDER — PANTOPRAZOLE SODIUM 40 MG PO TBEC
40.0000 mg | DELAYED_RELEASE_TABLET | Freq: Every day | ORAL | Status: DC
  Administered 2019-06-29 – 2019-07-08 (×10): 40 mg via ORAL
  Filled 2019-06-28 (×10): qty 1

## 2019-06-28 MED ORDER — ALBUTEROL SULFATE HFA 108 (90 BASE) MCG/ACT IN AERS
4.0000 | INHALATION_SPRAY | Freq: Once | RESPIRATORY_TRACT | Status: AC
  Administered 2019-06-28: 4 via RESPIRATORY_TRACT

## 2019-06-28 MED ORDER — IPRATROPIUM-ALBUTEROL 0.5-2.5 (3) MG/3ML IN SOLN
3.0000 mL | RESPIRATORY_TRACT | Status: DC | PRN
  Administered 2019-06-29: 3 mL via RESPIRATORY_TRACT
  Filled 2019-06-28: qty 3

## 2019-06-28 MED ORDER — METHYLPREDNISOLONE SODIUM SUCC 125 MG IJ SOLR
125.0000 mg | Freq: Three times a day (TID) | INTRAMUSCULAR | Status: DC
  Administered 2019-06-28 – 2019-06-29 (×3): 125 mg via INTRAVENOUS
  Filled 2019-06-28 (×3): qty 2

## 2019-06-28 MED ORDER — SIMVASTATIN 40 MG PO TABS
40.0000 mg | ORAL_TABLET | Freq: Every day | ORAL | Status: DC
  Administered 2019-06-29 – 2019-07-07 (×10): 40 mg via ORAL
  Filled 2019-06-28 (×11): qty 1

## 2019-06-28 MED ORDER — TRAZODONE HCL 50 MG PO TABS
150.0000 mg | ORAL_TABLET | Freq: Every day | ORAL | Status: DC
  Administered 2019-06-29 – 2019-07-06 (×8): 150 mg via ORAL
  Filled 2019-06-28 (×5): qty 3
  Filled 2019-06-28: qty 1
  Filled 2019-06-28 (×3): qty 3

## 2019-06-28 MED ORDER — SODIUM CHLORIDE 0.9 % IV SOLN
100.0000 mg | Freq: Once | INTRAVENOUS | Status: AC
  Administered 2019-06-28: 100 mg via INTRAVENOUS
  Filled 2019-06-28: qty 100

## 2019-06-28 MED ORDER — ENOXAPARIN SODIUM 40 MG/0.4ML ~~LOC~~ SOLN
40.0000 mg | SUBCUTANEOUS | Status: DC
  Administered 2019-06-28 – 2019-07-07 (×10): 40 mg via SUBCUTANEOUS
  Filled 2019-06-28 (×10): qty 0.4

## 2019-06-28 MED ORDER — GUAIFENESIN-DM 100-10 MG/5ML PO SYRP
5.0000 mL | ORAL_SOLUTION | ORAL | Status: DC | PRN
  Administered 2019-06-28 – 2019-06-29 (×3): 5 mL via ORAL
  Filled 2019-06-28 (×3): qty 10

## 2019-06-28 NOTE — H&P (Addendum)
History and Physical    Keyron Rosasco Terwilliger [id number] DOB: 19-Aug-1955 DOA: 06/28/2019  PCP: [name], MD  Patient coming from: Home  I have personally briefly reviewed patient's old medical records in [place]  Chief Complaint: SOB  HPI: Dekker Samborski Burgener is a 63 y.o. male with medical history significant of Asthma, CAD, T1DM (on insulin pump), HLD, GERD, Depression, and Tobacco abuse who presents with shortness of breath.  Patient reports he was in [place] up until about 12 days ago.  He states prior to that (about three weeks from today) and about 10 days prior to onset of symptoms he had resuming smoking tobacco.  He reports he has a prior 40 pack year history, had quit for two years, and without an acute stressor picked up smoking about 2 cigarettes a [place] for the past three weeks.  He states he had been on symbicort but ran out of that 2 months ago.  He states he began having cough, chest tightness, wheezing about 12 days ago.  He denies any fevers or chills and reports that he has not been able to bring up any sputum.  He has an albuterol inhaler which he had been using 8-10 x a Samples.  His symptoms had been worsening so he reports doing a telephone visit with Pulmonology, NP [name], who had prescribed him [facility] and [facility] steroids.  He has been using that regimen of 40 mg daily x 3 days and now 30 mg x 2 days but has had worsening symptoms.  He states his inhaler/nebulizer use has gone up to 12 x a [place] in the past three days.    Patient had PFTs done in March, mild restriction, normal ratios.  Review of Systems: As per HPI otherwise 10 point review of systems negative.    Past Medical History:  Diagnosis Date  . ANXIETY 04/03/2007  . Anxiety   . ASTHMA 09/06/2008  . Asthma   . ASTHMATIC BRONCHITIS, ACUTE 10/25/2008  . Bladder neck obstruction   . CARPAL TUNNEL SYNDROME, BILATERAL 07/31/2007   issues resolved, no surgery  . Cervical disc disease   . Chronic bronchitis ([place])     "get it about q yr" (02/12/2014)  . COPD (chronic obstructive pulmonary disease) ([facility])   . CORONARY ARTERY DISEASE 04/03/2007  . DEPRESSION 04/03/2007  . Depression   . DIABETES MELLITUS, TYPE I 04/03/2007  . Diabetic retinopathy associated with diabetes mellitus due to underlying condition ([place]) 04/03/2007  . DM W/EYE MANIFESTATIONS, TYPE I, UNCONTROLLED 04/04/2007  . 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 ED (erectile dysfunction)   . History of kidney stones   . HYPERLIPIDEMIA 04/04/2007  . Pneumonia    "several times and again today" (02/13/2104)  . Renal insufficiency   . Seizures ([place])    "insulin seizure from time to time; none in the last couple years" (02/12/2014)  . Spinal stenosis     Past Surgical History:  Procedure Laterality Date  . ANTERIOR CERVICAL DECOMP/DISCECTOMY FUSION  2000   "couple screws and a plate"  . ANTERIOR CERVICAL DECOMP/DISCECTOMY FUSION N/A 06/24/2016   Procedure: ANTERIOR CERVICAL DECOMPRESSION/DISCECTOMY FUSION CERVICAL FOUR - CERVICAL FIVE, CERVICAL FIVE - CERVICAL SIX; REMOVAL TETHER CERVICAL PLATE;  Surgeon: Jovita Gamma, MD;  Location: [place];  Service: Neurosurgery;  Laterality: N/A;  ANTERIOR CERVICAL DECOMPRESSION/DISCECTOMY FUSION CERVICAL FOUR - CERVICAL FIVE, CERVICAL FIVE - CERVICAL SIX; REMOVAL TETHER CERVICAL PLATE  . APPENDECTOMY    . BACK SURGERY    .  CARDIAC CATHETERIZATION  1990's  . CATARACT EXTRACTION W/ INTRAOCULAR LENS  IMPLANT, BILATERAL Bilateral   . CYSTOSCOPY WITH RETROGRADE PYELOGRAM, URETEROSCOPY AND STENT PLACEMENT Bilateral 04/06/2013   Procedure: BILATERAL CYSTOSCOPY WITH RETROGRADE PYELOGRAMS, STENT PLACEMENTS AND LEFT URETEROSCOPY AND STONE REMOVAL;  Surgeon: Alexis Frock, MD;  Location: [facility];  Service: Urology;  Laterality: Bilateral;  . CYSTOSCOPY WITH STENT PLACEMENT Right 04/12/2013   Procedure: CYSTOSCOPY WITH STENT PLACEMENT;  Surgeon: Alexis Frock, MD;  Location: [facility];  Service: Urology;   Laterality: Right;  . CYSTOSCOPY/RETROGRADE/URETEROSCOPY Bilateral 04/12/2013   Procedure: CYSTOSCOPY/RETROGRADE/URETEROSCOPY;  Surgeon: Alexis Frock, MD;  Location: [facility];  Service: Urology;  Laterality: Bilateral;  RIGHT RETROGRADE   . HOLMIUM LASER APPLICATION Left [id number]   Procedure: HOLMIUM LASER APPLICATION;  Surgeon: Alexis Frock, MD;  Location: [facility];  Service: Urology;  Laterality: Left;  . LUMBAR LAMINECTOMY/DECOMPRESSION MICRODISCECTOMY N/A 04/20/2019   Procedure: Lumbar microdisectomy and decompression L5-S1 left;  Surgeon: Latanya Maudlin, MD;  Location: [facility];  Service: Orthopedics;  Laterality: N/A;  37min  . LYMPH NODE DISSECTION  ~ 1960   groin  . stress cardiolite  09/06/2002  . TONSILLECTOMY    . VITRECTOMY Bilateral      reports that he has been smoking cigarettes. He has been smoking about 0.00 packs per [place]. He has never used smokeless tobacco. He reports that he does not drink alcohol or use drugs.  Allergies  Allergen Reactions  . Augmentin [Amoxicillin-Pot Clavulanate] Nausea And Vomiting and Other (See Comments)    Did it involve swelling of the face/tongue/throat, SOB, or low BP? No Did it involve sudden or severe rash/hives, skin peeling, or any reaction on the inside of your mouth or nose? No Did you need to seek medical attention at a hospital or doctor's office? No When did it last happen?5+ years If all above answers are "NO", may proceed with cephalosporin use.   [name] [Escitalopram Oxalate] Itching    Family History  Problem Relation Age of Onset  . Stroke Father        strong FH cerebrovascular disease  . Diabetes Brother   . Heart disease Brother        CHF  . Cancer Mother   . Colon cancer Neg Hx      Prior to Admission medications   Medication Sig Start Date End Date Taking? Authorizing Provider  acetaminophen (TYLENOL) 500 MG tablet Take 1,000 mg by mouth every 6 (six) hours as needed for moderate pain or headache.    Yes [provider]  albuterol (PROVENTIL) (2.5 MG/3ML) 0.083% nebulizer solution USE 1 VIAL VIA NEBULIZER EVERY 6 HOURS AS NEEDED FOR WHEEZING OR SHORTNESS OF BREATH Patient taking differently: Take 2.5 mg by nebulization every 6 (six) hours as needed for wheezing or shortness of breath.  06/26/19  Yes Rigoberto Noel, MD  albuterol (VENTOLIN HFA) 108 (90 Base) MCG/ACT inhaler INHALE 2 PUFFS BY MOUTH INTO THE LUNGS EVERY 6 HOURS AS NEEDED FOR WHEEZING OR SHORTNESS OF BREATH. Patient taking differently: Inhale 2 puffs into the lungs every 6 (six) hours as needed for wheezing or shortness of breath.  06/11/19  Yes Parrett, Tammy S, NP  doxycycline (VIBRA-TABS) 100 MG tablet Take 1 tablet (100 mg total) by mouth 2 (two) times daily. 06/25/19  Yes Martyn Ehrich, NP  fexofenadine (ALLEGRA) 180 MG tablet Take 180 mg by mouth daily.   Yes [provider]  HYDROcodone-homatropine (HYCODAN) 5-1.5 MG/5ML syrup Take 5 mLs by  mouth every 6 (six) hours as needed for cough. 06/25/19  Yes Martyn Ehrich, NP  insulin lispro (HUMALOG) 100 UNIT/ML injection Used in insulin pump   Yes [provider]  mometasone-formoterol (DULERA) 100-5 MCG/ACT AERO Inhale 2 puffs into the lungs 2 (two) times daily. 06/25/19  Yes Martyn Ehrich, NP  polyvinyl alcohol (LIQUIFILM TEARS) 1.4 % ophthalmic solution Place 1 drop into both eyes as needed for dry eyes.   Yes [provider]  predniSONE (DELTASONE) 10 MG tablet Take 4 tabs po daily x 3 days; then 3 tabs daily x3 days; then 2 tabs daily x3 days; then 1 tab daily x 3 days; then stop Patient taking differently: Take 10-40 mg by mouth See admin instructions. Take 40mg  po daily x 3 days; then 30 mg daily x3 days; then 20 mg daily x3 days; then 10 mg daily x 3 days; then stop 06/25/19  Yes Martyn Ehrich, NP  simvastatin (ZOCOR) 40 MG tablet Take 40 mg by mouth at bedtime.    Yes [provider]  traZODone (DESYREL) 150 MG  tablet Take 150 mg by mouth at bedtime.   Yes [provider]  varenicline (CHANTIX) 1 MG tablet Take 1 mg by mouth 2 (two) times daily.   Yes [provider]  budesonide-formoterol (SYMBICORT) 160-4.5 MCG/ACT inhaler INHALE 2 PUFFS INTO THE LUNGS 2 (TWO) TIMES DAILY. Patient not taking: Reported on 06/25/2019 08/30/18   Rigoberto Noel, MD  cyclobenzaprine (FLEXERIL) 10 MG tablet Take 1 tablet (10 mg total) by mouth 3 (three) times daily as needed for muscle spasms. Patient not taking: Reported on 06/25/2019 04/20/19   Ardeen Jourdain, PA-C  ondansetron (ZOFRAN) 4 MG tablet Take 1 tablet (4 mg total) by mouth every 6 (six) hours as needed for nausea or vomiting. Patient not taking: Reported on 06/25/2019 04/20/19   Ardeen Jourdain, PA-C  oxyCODONE-acetaminophen (PERCOCET) 5-325 MG tablet Take 1 tablet by mouth every 6 (six) hours as needed for severe pain. Patient not taking: Reported on 06/25/2019 04/20/19   Ardeen Jourdain, PA-C    Physical Exam: Vitals:   06/28/19 1700 06/28/19 1730 06/28/19 1750 06/28/19 1758  BP: 136/80 135/74  131/77  Pulse: 90 (!) 102 96 (!) 110  Resp: (!) 21 14 (!) 32 (!) 28  Temp:      TempSrc:      SpO2: 92% 93% 91% 95%  Weight:      Height:        Constitutional: NAD, calm, comfortable Vitals:   06/28/19 1700 06/28/19 1730 06/28/19 1750 06/28/19 1758  BP: 136/80 135/74  131/77  Pulse: 90 (!) 102 96 (!) 110  Resp: (!) 21 14 (!) 32 (!) 28  Temp:      TempSrc:      SpO2: 92% 93% 91% 95%  Weight:      Height:       Eyes: PERRL, lids and conjunctivae normal ENMT: Mucous membranes are moist. Posterior pharynx clear of any exudate or lesions.Normal dentition.  Neck: normal, supple, no masses, no thyromegaly Respiratory: diffuse inspiratory and expiratory wheezes  Cardiovascular: Regular rate and rhythm, no murmurs / rubs / gallops. No extremity edema. 2+ pedal pulses. No carotid bruits.  Abdomen: no tenderness, no masses palpated. No  hepatosplenomegaly. Bowel sounds positive.  Musculoskeletal: no clubbing / cyanosis. No joint deformity upper and lower extremities. Good ROM, no contractures. Normal muscle tone.  Skin: no rashes, lesions, ulcers. No induration Neurologic: CN 2-12 grossly intact. Sensation  and strength intact Psychiatric: Normal judgment and insight. Alert and oriented x 3. Normal mood.    Labs on Admission: I have personally reviewed following labs and imaging studies  CBC: Recent Labs  Lab 06/28/19 1030  WBC 8.8  NEUTROABS 8.3*  HGB 15.1  HCT 44.0  MCV 93.2  PLT 123456   Basic Metabolic Panel: Recent Labs  Lab 06/28/19 1030  NA 137  K 3.7  CL 104  CO2 24  GLUCOSE 198*  BUN 22  CREATININE 0.81  CALCIUM 8.8*   GFR: Estimated Creatinine Clearance: 92.1 mL/min (by C-G formula based on SCr of 0.81 mg/dL). Liver Function Tests: No results for input(s): AST, ALT, ALKPHOS, BILITOT, PROT, ALBUMIN in the last 168 hours. No results for input(s): LIPASE, AMYLASE in the last 168 hours. No results for input(s): AMMONIA in the last 168 hours. Coagulation Profile: No results for input(s): INR, PROTIME in the last 168 hours. Cardiac Enzymes: No results for input(s): CKTOTAL, CKMB, CKMBINDEX, TROPONINI in the last 168 hours. BNP (last 3 results) No results for input(s): PROBNP in the last 8760 hours. HbA1C: No results for input(s): HGBA1C in the last 72 hours. CBG: No results for input(s): GLUCAP in the last 168 hours. Lipid Profile: No results for input(s): CHOL, HDL, LDLCALC, TRIG, CHOLHDL, LDLDIRECT in the last 72 hours. Thyroid Function Tests: No results for input(s): TSH, T4TOTAL, FREET4, T3FREE, THYROIDAB in the last 72 hours. Anemia Panel: No results for input(s): VITAMINB12, FOLATE, FERRITIN, TIBC, IRON, RETICCTPCT in the last 72 hours. Urine analysis:    Component Value Date/Time   COLORURINE YELLOW 04/10/2017 0743   APPEARANCEUR CLEAR 04/10/2017 0743   LABSPEC 1.015 04/10/2017 0743    PHURINE 6.0 04/10/2017 0743   GLUCOSEU >=500 (A) 04/10/2017 0743   GLUCOSEU 500 (?) 05/08/2010 [place] 04/10/2017 0743   BILIRUBINUR NEGATIVE 04/10/2017 0743   KETONESUR NEGATIVE 04/10/2017 0743   PROTEINUR NEGATIVE 04/10/2017 0743   UROBILINOGEN 0.2 04/11/2013 0008   NITRITE NEGATIVE 04/10/2017 0743   LEUKOCYTESUR NEGATIVE 04/10/2017 0743    Radiological Exams on Admission: DG Chest Port 1 View  Result Date: 06/28/2019 CLINICAL DATA:  Shortness of breath EXAM: PORTABLE CHEST 1 VIEW COMPARISON:  October 18, 2018 FINDINGS: The heart size and mediastinal contours are within normal limits. Both lungs are clear. The visualized skeletal structures are unremarkable. IMPRESSION: No active disease. Electronically Signed   By: Prudencio Pair M.D.   On: 06/28/2019 12:29    Assessment/Plan Carmyne Rudy Totino is a 63 y.o. male with medical history significant of Asthma, CAD, T1DM (on insulin pump), HLD, GERD, Depression, and Tobacco abuse who presents with shortness of breath 2/2 acute asthma exacerbation  # Acute asthma exacerbation - hx of smoking, but PFTs not c/w COPD.  Patient has recently resumed smoking and with cold weather may be precipitating exacerbation - rapid covid neg, will obtain PCR and Respiratory Viral Panel; CXR is clear - due to failure on prednisone burst, will transition to high dose IV solumedrol 125 mg q8H for few doses and then transition back down and eventually taper off - continue Doxycycline  - continue dulera, duonebs - in the event there is a component of post-nasal gtt, reflux contributing will add flonase and PPI  # T1DM - continue insulin pump in house - will schedule accuchecks - if issues with pump, patient can turn off and we'd need to transition to Lantus 20 U qHS and 4 units with meals with ISS and monitor closely  and titrate  # HLD - continue simvastatin   DVT prophylaxis: Lovenox Code Status: Full Disposition Plan: Admit to Obs   Truddie Hidden MD [facility] Pager [phone]  If 7PM-7AM, please contact night-coverage [email or web address] Password [email or web address]  06/28/2019, 6:26 PM

## 2019-06-28 NOTE — ED Triage Notes (Signed)
Patient states increased SOB and a cough that is uncontrollable x 3-4 days. Patient states he has a history of pneumonia. Sats in triage 90% on room air.

## 2019-06-28 NOTE — ED Notes (Signed)
Patients wife, Shauna Hugh would like the nurse to call her, [phone].

## 2019-06-28 NOTE — ED Notes (Addendum)
Pt ambulated around room, O2 reading 87%-89% RA while ambulating. Pt c/o feeling SOB, pt needed to sit down. Pt back in bed, 2L [place] applied. Pt now reading 95% on 2L [place]. Dr. Wyvonnia Dusky aware.

## 2019-06-28 NOTE — ED Provider Notes (Signed)
[facility] DEPT Provider Note   CSN: [id number] Arrival date & time: 06/28/19  [id number]     History Chief Complaint  Patient presents with  . Shortness of Breath  . Cough    James [name] is a 63 y.o. male.  Patient presents with increased shortness of breath and cough for the past 5 days.  She has been having difficulty breathing for overall the past 11 days.  He was seen by his pulmonologist and had a virtual visit, was given steroids and doxycycline for suspected bronchitis on 12/7.  He does have a history of bronchitis and previous pneumonia.   He has a history of possible COPD and is a former smoker.  He does not smoke currently.  States he is been coughing to the point where "a "felt my air".  He did notice some blood in his sputum this morning.  He denies any fevers.  He denies any chest pain or leg pain or leg swelling.  Some sore throat from coughing.  Has been using his nebulizer 10 or 11 times a [place].  He was also restarted on Dulera by his PCP after being out of Symbicort for several weeks.  States he came in today because he was told he needed a chest x-ray and a Covid test.  He was tested yesterday at a [place] clinic but does not know the results.  No known coronavirus exposures.  No leg pain or leg swelling.  Does have a history of type 1 diabetes with insulin pump.  The history is provided by the patient.  Shortness of Breath Associated symptoms: cough and wheezing   Associated symptoms: no abdominal pain, no chest pain, no fever, no headaches and no vomiting   Cough Associated symptoms: rhinorrhea, shortness of breath and wheezing   Associated symptoms: no chest pain, no fever, no headaches and no myalgias        Past Medical History:  Diagnosis Date  . ANXIETY 04/03/2007  . Anxiety   . ASTHMA 09/06/2008  . Asthma   . ASTHMATIC BRONCHITIS, ACUTE 10/25/2008  . Bladder neck obstruction   . CARPAL TUNNEL SYNDROME, BILATERAL 07/31/2007   issues resolved, no surgery  . Cervical disc disease   . Chronic bronchitis ([place])    "get it about q yr" (02/12/2014)  . COPD (chronic obstructive pulmonary disease) ([place])   . CORONARY ARTERY DISEASE 04/03/2007  . DEPRESSION 04/03/2007  . Depression   . DIABETES MELLITUS, TYPE I 04/03/2007  . Diabetic retinopathy associated with diabetes mellitus due to underlying condition ([place]) 04/03/2007  . DM W/EYE MANIFESTATIONS, TYPE I, UNCONTROLLED 04/04/2007  . 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 ED (erectile dysfunction)   . History of kidney stones   . HYPERLIPIDEMIA 04/04/2007  . Pneumonia    "several times and again today" (02/13/2104)  . Renal insufficiency   . Seizures ([place])    "insulin seizure from time to time; none in the last couple years" (02/12/2014)  . Spinal stenosis     Patient Active Problem List   Diagnosis Date Noted  . 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 Hypoglycemia due to insulin 04/20/2019  . DM type 1 (diabetes mellitus, type 1) ([place]) 04/20/2019  . HNP (herniated nucleus pulposus), cervical 06/24/2016  . Syncope 05/20/2016  . Tobacco abuse 05/20/2016  . Cervical disc disorder with radiculopathy of cervical region 05/20/2016  . Aspiration pneumonia ([place]) 02/12/2014  . Gastroparesis 02/10/2011  . Esophageal  reflux 12/21/2010  . TOBACCO USE, QUIT 05/08/2010  . CONTUSION, RIGHT CHEST WALL 04/07/2010  . Chronic asthmatic bronchitis ([place]) 10/25/2008  . Asthma, persistent controlled 09/06/2008  . COUGH 09/06/2008  . SHOULDER PAIN, RIGHT 08/19/2008  . BLADDER OUTLET OBSTRUCTION 04/15/2008  . RASH AND OTHER NONSPECIFIC SKIN ERUPTION 02/21/2008  . CARPAL TUNNEL SYNDROME, BILATERAL 07/31/2007  . Allergic rhinitis 07/31/2007  . RENAL INSUFFICIENCY 07/31/2007  . OTHER TENOSYNOVITIS OF HAND AND WRIST 07/31/2007  . 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 HLD (hyperlipidemia) 04/04/2007  . DIABETIC RETINOPATHY, PROLIFERATIVE 04/03/2007  . ANXIETY 04/03/2007  . DEPRESSION 04/03/2007  . 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 CAD (coronary artery disease) 04/03/2007  . DYSHIDROSIS 04/03/2007    Past Surgical History:  Procedure Laterality Date  . ANTERIOR CERVICAL DECOMP/DISCECTOMY FUSION  2000   "couple screws and a plate"  . ANTERIOR CERVICAL DECOMP/DISCECTOMY FUSION N/A 06/24/2016   Procedure: ANTERIOR CERVICAL DECOMPRESSION/DISCECTOMY FUSION CERVICAL FOUR - CERVICAL FIVE, CERVICAL FIVE - CERVICAL SIX; REMOVAL TETHER CERVICAL PLATE;  Surgeon: Jovita Gamma, MD;  Location: [facility];  Service: Neurosurgery;  Laterality: N/A;  ANTERIOR CERVICAL DECOMPRESSION/DISCECTOMY FUSION CERVICAL FOUR - CERVICAL FIVE, CERVICAL FIVE - CERVICAL SIX; REMOVAL TETHER CERVICAL PLATE  . APPENDECTOMY    . BACK SURGERY    . CARDIAC CATHETERIZATION  1990's  . CATARACT EXTRACTION W/ INTRAOCULAR LENS  IMPLANT, BILATERAL Bilateral   . CYSTOSCOPY WITH RETROGRADE PYELOGRAM, URETEROSCOPY AND STENT PLACEMENT Bilateral 04/06/2013   Procedure: BILATERAL CYSTOSCOPY WITH RETROGRADE PYELOGRAMS, STENT PLACEMENTS AND LEFT URETEROSCOPY AND STONE REMOVAL;  Surgeon: Alexis Frock, MD;  Location: [facility];  Service: Urology;  Laterality: Bilateral;  . CYSTOSCOPY WITH STENT PLACEMENT Right 04/12/2013   Procedure: CYSTOSCOPY WITH STENT PLACEMENT;  Surgeon: Alexis Frock, MD;  Location: [facility];  Service: Urology;  Laterality: Right;  . CYSTOSCOPY/RETROGRADE/URETEROSCOPY Bilateral 04/12/2013   Procedure: CYSTOSCOPY/RETROGRADE/URETEROSCOPY;  Surgeon: Alexis Frock, MD;  Location: [facility];  Service: Urology;  Laterality: Bilateral;  RIGHT RETROGRADE   . HOLMIUM LASER APPLICATION Left [id number]   Procedure: HOLMIUM LASER APPLICATION;  Surgeon: Alexis Frock, MD;  Location: [facility];  Service: Urology;  Laterality: Left;  . LUMBAR  LAMINECTOMY/DECOMPRESSION MICRODISCECTOMY N/A 04/20/2019   Procedure: Lumbar microdisectomy and decompression L5-S1 left;  Surgeon: Latanya Maudlin, MD;  Location: [facility];  Service: Orthopedics;  Laterality: N/A;  63min  . LYMPH NODE DISSECTION  ~ 1960   groin  . stress cardiolite  09/06/2002  . TONSILLECTOMY    . VITRECTOMY Bilateral        Family History  Problem Relation Age of Onset  . Stroke Father        strong FH cerebrovascular disease  . Diabetes Brother   . Heart disease Brother        CHF  . Cancer Mother   . Colon cancer Neg Hx     Social History   Tobacco Use  . Smoking status: Current Every Bauch Smoker    Packs/Broski: 0.00    Types: Cigarettes    Last attempt to quit: 02/09/2017    Years since quitting: 2.3  . Smokeless tobacco: Never Used  . Tobacco comment: resumed smoking in fall of 2017  Substance Use Topics  . Alcohol use: No  . Drug use: No    Home Medications Prior to Admission medications   Medication Sig Start Date End Date Taking? Authorizing Provider  acetaminophen (TYLENOL)  500 MG tablet Take 1,000 mg by mouth every 6 (six) hours as needed for moderate pain or headache.    [provider]  albuterol (PROVENTIL) (2.5 MG/3ML) 0.083% nebulizer solution USE 1 VIAL VIA NEBULIZER EVERY 6 HOURS AS NEEDED FOR WHEEZING OR SHORTNESS OF BREATH 06/26/19   Rigoberto Noel, MD  albuterol (VENTOLIN HFA) 108 (90 Base) MCG/ACT inhaler INHALE 2 PUFFS BY MOUTH INTO THE LUNGS EVERY 6 HOURS AS NEEDED FOR WHEEZING OR SHORTNESS OF BREATH. 06/11/19   Parrett, Fonnie Mu, NP  bismuth subsalicylate (PEPTO BISMOL) 262 MG/15ML suspension Take 30 mLs by mouth every 6 (six) hours as needed for indigestion or diarrhea or loose stools.    [provider]  budesonide-formoterol (SYMBICORT) 160-4.5 MCG/ACT inhaler INHALE 2 PUFFS INTO THE LUNGS 2 (TWO) TIMES DAILY. Patient not taking: Reported on 06/25/2019 08/30/18   Rigoberto Noel, MD  cetirizine (ZYRTEC) 10 MG tablet Take  10 mg by mouth daily.    [provider]  cyclobenzaprine (FLEXERIL) 10 MG tablet Take 1 tablet (10 mg total) by mouth 3 (three) times daily as needed for muscle spasms. Patient not taking: Reported on 06/25/2019 04/20/19   Ardeen Jourdain, PA-C  doxycycline (VIBRA-TABS) 100 MG tablet Take 1 tablet (100 mg total) by mouth 2 (two) times daily. 06/25/19   Martyn Ehrich, NP  HYDROcodone-homatropine [facility]) 5-1.5 MG/5ML syrup Take 5 mLs by mouth every 6 (six) hours as needed for cough. 06/25/19   Martyn Ehrich, NP  ibuprofen (ADVIL,MOTRIN) 200 MG tablet Take 400-800 mg by mouth every 8 (eight) hours as needed for headache or moderate pain.     [provider]  Insulin Human (INSULIN PUMP) SOLN Inject into the skin continuous. Humalog    [provider]  insulin lispro (HUMALOG) 100 UNIT/ML injection Used in insulin pump    [provider]  mometasone-formoterol (DULERA) 100-5 MCG/ACT AERO Inhale 2 puffs into the lungs 2 (two) times daily. 06/25/19   Martyn Ehrich, NP  Multiple Vitamins-Minerals (AIRBORNE PO) Take 1 tablet by mouth 3 (three) times daily.    [provider]  ondansetron (ZOFRAN) 4 MG tablet Take 1 tablet (4 mg total) by mouth every 6 (six) hours as needed for nausea or vomiting. Patient not taking: Reported on 06/25/2019 04/20/19   Ardeen Jourdain, PA-C  oxyCODONE-acetaminophen (PERCOCET) 5-325 MG tablet Take 1 tablet by mouth every 6 (six) hours as needed for severe pain. Patient not taking: Reported on 06/25/2019 04/20/19   Ardeen Jourdain, PA-C  predniSONE (DELTASONE) 10 MG tablet Take 4 tabs po daily x 3 days; then 3 tabs daily x3 days; then 2 tabs daily x3 days; then 1 tab daily x 3 days; then stop 06/25/19   Martyn Ehrich, NP  simvastatin (ZOCOR) 40 MG tablet Take 40 mg by mouth at bedtime.     [provider]  traZODone (DESYREL) 150 MG tablet Take 150 mg by mouth at bedtime.    [provider]   varenicline (CHANTIX) 1 MG tablet Take 1 mg by mouth 2 (two) times daily.    [provider]    Allergies    Augmentin [amoxicillin-pot clavulanate] and Lexapro [escitalopram oxalate]  Review of Systems   Review of Systems  Constitutional: Positive for fatigue. Negative for activity change, appetite change and fever.  HENT: Positive for congestion and rhinorrhea.   Respiratory: Positive for cough, shortness of breath and wheezing. Negative for chest tightness.   Cardiovascular: Negative for chest pain.  Gastrointestinal: Negative for abdominal pain, nausea and vomiting.  Genitourinary: Negative for dysuria and hematuria.  Musculoskeletal: Negative for arthralgias and myalgias.  Neurological: Negative for dizziness, weakness and headaches.   all other systems are negative except as noted in the HPI and PMH.    Physical Exam Updated Vital Signs BP 140/84 (BP Location: Right Arm)   Pulse (!) 115   Temp 98.4 F (36.9 C) (Oral)   Resp 20   Ht 5\' 9"  (1.753 m)   Wt 77.1 kg   SpO2 90%   BMI 25.10 kg/m   Physical Exam Vitals and nursing note reviewed.  Constitutional:      General: He is in acute distress.     Appearance: He is well-developed.     Comments: Mild respiratory distress, speaking short sentences  HENT:     Head: Normocephalic and atraumatic.     Nose: No rhinorrhea.     Mouth/Throat:     Mouth: Mucous membranes are moist.     Pharynx: No oropharyngeal exudate.  Eyes:     Conjunctiva/sclera: Conjunctivae normal.     Pupils: Pupils are equal, round, and reactive to light.  Neck:     Comments: No meningismus. Cardiovascular:     Rate and Rhythm: Normal rate and regular rhythm.     Heart sounds: Normal heart sounds. No murmur.  Pulmonary:     Effort: Respiratory distress present.     Breath sounds: Wheezing present.     Comments: Diffuse inspiratory expiratory wheezing Abdominal:     Palpations: Abdomen is soft.     Tenderness: There is no abdominal  tenderness. There is no guarding or rebound.  Musculoskeletal:        General: No tenderness. Normal range of motion.     Cervical back: Normal range of motion and neck supple.     Right lower leg: No edema.     Left lower leg: No edema.  Skin:    General: Skin is warm.     Capillary Refill: Capillary refill takes less than 2 seconds.  Neurological:     General: No focal deficit present.     Mental Status: He is alert and oriented to person, place, and time. Mental status is at baseline.     Cranial Nerves: No cranial nerve deficit.     Motor: No abnormal muscle tone.     Coordination: Coordination normal.     Comments:  5/5 strength throughout. CN 2-12 intact.Equal grip strength.   Psychiatric:        Behavior: Behavior normal.     ED Results / Procedures / Treatments   Labs (all labs ordered are listed, but only abnormal results are displayed) Labs Reviewed  CBC WITH DIFFERENTIAL/PLATELET - Abnormal; Notable for the following components:      Result Value   Neutro Abs 8.3 (*)    Lymphs Abs 0.4 (*)    All other components within normal limits  BASIC METABOLIC PANEL - Abnormal; Notable for the following components:   Glucose, Bld 198 (*)    Calcium 8.8 (*)    All other components within normal limits  D-DIMER, QUANTITATIVE (NOT AT [facility]) - Abnormal; Notable for the following components:   D-Dimer, Quant 0.68 (*)    All other components within normal limits  SARS CORONAVIRUS 2 (TAT 6-24 HRS)  BRAIN NATRIURETIC PEPTIDE  POC SARS CORONAVIRUS 2 AG -  ED  TROPONIN I (HIGH SENSITIVITY)  TROPONIN I (HIGH SENSITIVITY)    EKG EKG  Interpretation  Date/Time:  Thursday June 28 2019 10:17:35 EST Ventricular Rate:  109 PR Interval:    QRS Duration: 94 QT Interval:  339 QTC Calculation: 457 R Axis:   93 Text Interpretation: Sinus tachycardia Right axis deviation Anteroseptal infarct, old Baseline wander in lead(s) III aVL aVF Rate faster Confirmed by Ezequiel Essex [phone])  on 06/28/2019 10:24:37 AM   Radiology DG Chest Port 1 View  Result Date: 06/28/2019 CLINICAL DATA:  Shortness of breath EXAM: PORTABLE CHEST 1 VIEW COMPARISON:  October 18, 2018 FINDINGS: The heart size and mediastinal contours are within normal limits. Both lungs are clear. The visualized skeletal structures are unremarkable. IMPRESSION: No active disease. Electronically Signed   By: Prudencio Pair M.D.   On: 06/28/2019 12:29    Procedures .Critical Care Performed by: Ezequiel Essex, MD Authorized by: Ezequiel Essex, MD   Critical care provider statement:    Critical care time (minutes):  35   Critical care was necessary to treat or prevent imminent or life-threatening deterioration of the following conditions:  Respiratory failure   Critical care was time spent personally by me on the following activities:  Discussions with consultants, evaluation of patient's response to treatment, examination of patient, ordering and performing treatments and interventions, ordering and review of laboratory studies, ordering and review of radiographic studies, pulse oximetry, re-evaluation of patient's condition, obtaining history from patient or surrogate and review of old charts   (including critical care time)  Medications Ordered in ED Medications - No data to display  ED Course  I have reviewed the triage vital signs and the nursing notes.  Pertinent labs & imaging results that were available during my care of the patient were reviewed by me and considered in my medical decision making (see chart for details).    MDM Rules/Calculators/A&P                      11 days of shortness of breath with cough.  History of COPD and bronchitis.  Currently completing treatment for bronchitis with doxycycline and prednisone.  Patient given bronchodilators and steroids as well as IV magnesium.  X-rays negative for infiltrate.  Age-adjusted D-dimer is slightly positive but favor COPD exacerbation given  patient's wheezing.  He has some increased shortness of breath  And with attempted ambulation desaturates to 86%.  Complaint of increasing shortness of breath and chest tightness.  We will plan admission to the hospital for suspected COPD exacerbation with coronavirus test pending thought the initial antigen chest is negative.   Admission d/w Dr. Andria Frames.  Final Clinical Impression(s) / ED Diagnoses Final diagnoses:  COPD exacerbation ([place])  Hypoxia    Rx / DC Orders ED Discharge Orders    None       Anneli Bing, Annie Main, MD 06/28/19 1753

## 2019-06-29 DIAGNOSIS — Z9842 Cataract extraction status, left eye: Secondary | ICD-10-CM | POA: Diagnosis not present

## 2019-06-29 DIAGNOSIS — R0602 Shortness of breath: Secondary | ICD-10-CM

## 2019-06-29 DIAGNOSIS — Z961 Presence of intraocular lens: Secondary | ICD-10-CM | POA: Diagnosis present

## 2019-06-29 DIAGNOSIS — J9601 Acute respiratory failure with hypoxia: Secondary | ICD-10-CM | POA: Diagnosis present

## 2019-06-29 DIAGNOSIS — J441 Chronic obstructive pulmonary disease with (acute) exacerbation: Secondary | ICD-10-CM | POA: Diagnosis not present

## 2019-06-29 DIAGNOSIS — Z79899 Other long term (current) drug therapy: Secondary | ICD-10-CM | POA: Diagnosis not present

## 2019-06-29 DIAGNOSIS — Z794 Long term (current) use of insulin: Secondary | ICD-10-CM | POA: Diagnosis not present

## 2019-06-29 DIAGNOSIS — Z8701 Personal history of pneumonia (recurrent): Secondary | ICD-10-CM | POA: Diagnosis not present

## 2019-06-29 DIAGNOSIS — E785 Hyperlipidemia, unspecified: Secondary | ICD-10-CM | POA: Diagnosis present

## 2019-06-29 DIAGNOSIS — Z7951 Long term (current) use of inhaled steroids: Secondary | ICD-10-CM | POA: Diagnosis not present

## 2019-06-29 DIAGNOSIS — Z981 Arthrodesis status: Secondary | ICD-10-CM | POA: Diagnosis not present

## 2019-06-29 DIAGNOSIS — I2510 Atherosclerotic heart disease of native coronary artery without angina pectoris: Secondary | ICD-10-CM | POA: Diagnosis present

## 2019-06-29 DIAGNOSIS — F17210 Nicotine dependence, cigarettes, uncomplicated: Secondary | ICD-10-CM | POA: Diagnosis present

## 2019-06-29 DIAGNOSIS — Z9089 Acquired absence of other organs: Secondary | ICD-10-CM | POA: Diagnosis not present

## 2019-06-29 DIAGNOSIS — J45901 Unspecified asthma with (acute) exacerbation: Secondary | ICD-10-CM | POA: Diagnosis not present

## 2019-06-29 DIAGNOSIS — Z8249 Family history of ischemic heart disease and other diseases of the circulatory system: Secondary | ICD-10-CM | POA: Diagnosis not present

## 2019-06-29 DIAGNOSIS — Z0184 Encounter for antibody response examination: Secondary | ICD-10-CM | POA: Diagnosis not present

## 2019-06-29 DIAGNOSIS — R0902 Hypoxemia: Secondary | ICD-10-CM | POA: Diagnosis not present

## 2019-06-29 DIAGNOSIS — J4541 Moderate persistent asthma with (acute) exacerbation: Secondary | ICD-10-CM | POA: Diagnosis present

## 2019-06-29 DIAGNOSIS — Z9841 Cataract extraction status, right eye: Secondary | ICD-10-CM | POA: Diagnosis not present

## 2019-06-29 DIAGNOSIS — Z823 Family history of stroke: Secondary | ICD-10-CM | POA: Diagnosis not present

## 2019-06-29 DIAGNOSIS — K219 Gastro-esophageal reflux disease without esophagitis: Secondary | ICD-10-CM | POA: Diagnosis present

## 2019-06-29 DIAGNOSIS — R05 Cough: Secondary | ICD-10-CM | POA: Diagnosis not present

## 2019-06-29 DIAGNOSIS — Z9049 Acquired absence of other specified parts of digestive tract: Secondary | ICD-10-CM | POA: Diagnosis not present

## 2019-06-29 DIAGNOSIS — Z20828 Contact with and (suspected) exposure to other viral communicable diseases: Secondary | ICD-10-CM | POA: Diagnosis present

## 2019-06-29 DIAGNOSIS — Z833 Family history of diabetes mellitus: Secondary | ICD-10-CM | POA: Diagnosis not present

## 2019-06-29 DIAGNOSIS — Z9641 Presence of insulin pump (external) (internal): Secondary | ICD-10-CM | POA: Diagnosis present

## 2019-06-29 DIAGNOSIS — E103599 Type 1 diabetes mellitus with proliferative diabetic retinopathy without macular edema, unspecified eye: Secondary | ICD-10-CM | POA: Diagnosis present

## 2019-06-29 DIAGNOSIS — F410 Panic disorder [episodic paroxysmal anxiety] without agoraphobia: Secondary | ICD-10-CM | POA: Diagnosis not present

## 2019-06-29 LAB — CBC
HCT: 43.5 % (ref 39.0–52.0)
Hemoglobin: 14.7 g/dL (ref 13.0–17.0)
MCH: 31.7 pg (ref 26.0–34.0)
MCHC: 33.8 g/dL (ref 30.0–36.0)
MCV: 94 fL (ref 80.0–100.0)
Platelets: 254 10*3/uL (ref 150–400)
RBC: 4.63 MIL/uL (ref 4.22–5.81)
RDW: 12.2 % (ref 11.5–15.5)
WBC: 7.1 10*3/uL (ref 4.0–10.5)
nRBC: 0 % (ref 0.0–0.2)

## 2019-06-29 LAB — BASIC METABOLIC PANEL
Anion gap: 12 (ref 5–15)
BUN: 19 mg/dL (ref 8–23)
CO2: 20 mmol/L — ABNORMAL LOW (ref 22–32)
Calcium: 8.5 mg/dL — ABNORMAL LOW (ref 8.9–10.3)
Chloride: 104 mmol/L (ref 98–111)
Creatinine, Ser: 0.73 mg/dL (ref 0.61–1.24)
GFR calc Af Amer: >60 mL/min (ref >60)
GFR calc non Af Amer: >60 mL/min (ref >60)
Glucose, Bld: 221 mg/dL — ABNORMAL HIGH (ref 70–99)
Potassium: 4.1 mmol/L (ref 3.5–5.1)
Sodium: 136 mmol/L (ref 135–145)

## 2019-06-29 LAB — C-REACTIVE PROTEIN: CRP: 0.5 mg/dL (ref <1.0)

## 2019-06-29 LAB — GLUCOSE, CAPILLARY: Glucose-Capillary: 136 mg/dL — ABNORMAL HIGH (ref 70–99)

## 2019-06-29 LAB — D-DIMER, QUANTITATIVE: D-Dimer, Quant: 0.62 ug/mL-FEU — ABNORMAL HIGH (ref 0.00–0.50)

## 2019-06-29 LAB — PROCALCITONIN: Procalcitonin: <0.1 ng/mL

## 2019-06-29 LAB — FERRITIN: Ferritin: 41 ng/mL (ref 24–336)

## 2019-06-29 MED ORDER — LORATADINE 10 MG PO TABS
10.0000 mg | ORAL_TABLET | Freq: Every day | ORAL | Status: DC
  Administered 2019-06-29 – 2019-07-08 (×10): 10 mg via ORAL
  Filled 2019-06-29 (×10): qty 1

## 2019-06-29 MED ORDER — GUAIFENESIN-CODEINE 100-10 MG/5ML PO SOLN
5.0000 mL | Freq: Four times a day (QID) | ORAL | Status: DC | PRN
  Administered 2019-06-29 – 2019-07-06 (×8): 5 mL via ORAL
  Filled 2019-06-29 (×10): qty 5

## 2019-06-29 MED ORDER — INSULIN PUMP
Freq: Three times a day (TID) | SUBCUTANEOUS | Status: DC
  Administered 2019-06-29: 18:00:00 via SUBCUTANEOUS
  Administered 2019-06-30: 6.3 via SUBCUTANEOUS
  Administered 2019-06-30: 14:00:00 8.3 via SUBCUTANEOUS
  Administered 2019-06-30: 4 via SUBCUTANEOUS
  Administered 2019-07-01: 08:00:00 8 via SUBCUTANEOUS
  Administered 2019-07-01: 12:00:00 3.7 via SUBCUTANEOUS
  Filled 2019-06-29: qty 1

## 2019-06-29 MED ORDER — HYDROCODONE-HOMATROPINE 5-1.5 MG/5ML PO SYRP
5.0000 mL | ORAL_SOLUTION | Freq: Four times a day (QID) | ORAL | Status: DC | PRN
  Administered 2019-06-29 – 2019-07-08 (×22): 5 mL via ORAL
  Filled 2019-06-29 (×23): qty 5

## 2019-06-29 MED ORDER — ALBUTEROL SULFATE (2.5 MG/3ML) 0.083% IN NEBU
2.5000 mg | INHALATION_SOLUTION | RESPIRATORY_TRACT | Status: DC | PRN
  Administered 2019-06-29 – 2019-07-05 (×6): 2.5 mg via RESPIRATORY_TRACT
  Filled 2019-06-29 (×7): qty 3

## 2019-06-29 MED ORDER — ADULT MULTIVITAMIN W/MINERALS CH
1.0000 | ORAL_TABLET | Freq: Every day | ORAL | Status: DC
  Administered 2019-06-29 – 2019-07-08 (×10): 1 via ORAL
  Filled 2019-06-29 (×10): qty 1

## 2019-06-29 MED ORDER — DM-GUAIFENESIN ER 30-600 MG PO TB12
1.0000 | ORAL_TABLET | Freq: Two times a day (BID) | ORAL | Status: DC
  Administered 2019-06-29 – 2019-07-08 (×18): 1 via ORAL
  Filled 2019-06-29 (×17): qty 1

## 2019-06-29 MED ORDER — METHYLPREDNISOLONE SODIUM SUCC 125 MG IJ SOLR
60.0000 mg | Freq: Three times a day (TID) | INTRAMUSCULAR | Status: DC
  Administered 2019-06-29 – 2019-07-06 (×20): 60 mg via INTRAVENOUS
  Filled 2019-06-29 (×21): qty 2

## 2019-06-29 MED ORDER — HYDROCODONE-HOMATROPINE 5-1.5 MG/5ML PO SYRP: 5.0000 mL | ORAL_SOLUTION | Freq: Four times a day (QID) | ORAL | Status: DC | PRN

## 2019-06-29 MED ORDER — IPRATROPIUM-ALBUTEROL 0.5-2.5 (3) MG/3ML IN SOLN
3.0000 mL | Freq: Three times a day (TID) | RESPIRATORY_TRACT | Status: DC
  Administered 2019-06-29 – 2019-07-03 (×14): 3 mL via RESPIRATORY_TRACT
  Filled 2019-06-29 (×14): qty 3

## 2019-06-29 MED ORDER — PRO-STAT SUGAR FREE PO LIQD
30.0000 mL | Freq: Three times a day (TID) | ORAL | Status: DC
  Administered 2019-06-29 (×2): 30 mL via ORAL
  Filled 2019-06-29 (×8): qty 30

## 2019-06-29 NOTE — Progress Notes (Signed)
Pt checked his own HS CBG with his machine. Result was 106. No insulin given via pump for this value. Hortencia Conradi RN

## 2019-06-29 NOTE — ED Notes (Signed)
Patient on insulin pump and took blood sugar around 0800 and it was 212. Patient's pump tells him that he was given 4 units insulin.

## 2019-06-29 NOTE — Significant Event (Signed)
Rapid Response Event Note  Overview: Time Called: [id number] Arrival Time: 1853 Event Type: Respiratory  Initial Focused Assessment:  RRT called due to new onset respiratory distress. Patient admitted with [facility]. Patient reportedly had a coughing spell and was having difficulty recovering. On arrival patient labored and tachypnic. Oxygenation on 8L neb treatment 93%. PRN Albuterol neb being administered. Wheezing heard throughout. Patient diaphoretic and tachycardic. After Albuterol administration patient able to speak in full sentences and respiratory effort unlabored. Patients main concern is cough medication not being effective and wheezing. O2 95 on 5L [place] post Albuterol. Plan to page for alternative cough medication and respiratory to administer scheduled nebulizer and inhaler. Wheezing dimminshed but still present.   Event Summary: Name of Physician Notified: [place] at [facility]  Outcome: Stayed in room and stabalized  Event End Time: [place]

## 2019-06-29 NOTE — ED Notes (Addendum)
PT began coughing and became SOB.  Pt o2 decreased to 86% on 4 L pt was anxious after coughing and becoming SOB. Pt given robitussin for cough and Duoneb for SOB. Pt not resting comfortably on 4L [place] with an o2 of 93%

## 2019-06-29 NOTE — Progress Notes (Signed)
Initial Nutrition Assessment  DOCUMENTATION CODES:   Not applicable  INTERVENTION:  - will Will order 30 mL Prostat BID, each supplement provides 100 kcal and 15 grams of protein. - will order daily multivitamin with minerals.    NUTRITION DIAGNOSIS:   Increased nutrient needs related to acute illness as evidenced by estimated needs.  GOAL:   Patient will meet greater than or equal to 90% of their needs  MONITOR:   PO intake, Supplement acceptance, Labs, Weight trends  REASON FOR ASSESSMENT:   Consult Assessment of nutrition requirement/status  ASSESSMENT:   63 y.o. male with medical history significant of asthma, CAD, type 1 DM (on insulin pump), HLD, GERD, depression, and tobacco abuse. He presented to the ED with SOB, cough, and chest tightness. He was in his normal state of health until 12 days PTA. He had previously stopped smoking but began again about 3 weeks ago, smoking 2 cigarettes/Aungst. He ran out of Symbicort two months ago. He was using his inhaler/nebulizer x12/[place] for the 3 days PTA.  Patient did not eat breakfast this AM due to being moved from the ED to [place] at [facility]. He usually has a great appetite at baseline and has no difficulty chewing or swallowing, but he has had decreased appetite and intermittent increase in SOB with PO intakes over the past 1-2 weeks. Chewing and swallowing have been unaffected.   Per chart review, current weight is 170 lb and weight on 9/29 was 175 lb. This indicates 5 lb weight loss (2.8% body weight) in the past 2.5 months; not significant for time frame. Weight last winter consistent with current weight.   Per notes: - acute asthma exacerbation - type 1 DM with insulin pump - patient is OBS   Labs reviewed; Ca: 8.5 mg/dl. Medications reviewed; 125 mg solu-medrol TID, 40 mg oral protonix/Mak.      NUTRITION - FOCUSED PHYSICAL EXAM:  completed; no muscle or fat wasting.   Diet Order:   Diet Order            Diet Carb  Modified Fluid consistency: Thin; Room service appropriate? Yes  Diet effective now              EDUCATION NEEDS:   No education needs have been identified at this time  Skin:  Skin Assessment: Reviewed RN Assessment  Last BM:  12/9  Height:   Ht Readings from Last 1 Encounters:  06/28/19 5\' 9"  (1.753 m)    Weight:   Wt Readings from Last 1 Encounters:  06/28/19 77.1 kg    Ideal Body Weight:  72.7 kg  BMI:  Body mass index is 25.1 kg/m.  Estimated Nutritional Needs:   Kcal:  1900-2100 kcal  Protein:  95-105 grams  Fluid:  >/= 2 L/[place]      Jarome Matin, MS, RD, LDN, [facility] Inpatient Clinical Dietitian Pager # [phone] After hours/weekend pager # [phone]

## 2019-06-29 NOTE — Progress Notes (Signed)
Patient c/o Shortness of breath and felt like this throat was closing. 02 @ 4L via n/c. Sat's 93%. Writer gave patient PRN albuterol breathing treatment. Rapid and Respiratory paged.

## 2019-06-29 NOTE — Progress Notes (Signed)
James NOTE  TRUE Cowdin Mainwaring [id number] DOB: 1955-10-16 DOA: 06/28/2019 PCP: [name], James  HPI/Recap of past 24 hours: HPI from Dr [name] R Kertesz is a 63 y.o. male with medical history significant of [name], James [name], James (on insulin pump), James [name], James [name], James [name], James [name] abuse who presents with worsening shortness of breath, cough, chest tightness/wheezing for about 2 weeks. Pt initially quit smoking [name] for 2 years, (was on Chantix), but recently started smoking about 3 weeks ago.  Due to worsening symptoms, patient had a telephone visit with Pulmonology, NP [name], who had prescribed him [facility] James Tapered steroids, without any significant improvement. Patient had PFTs done in March, mild restriction, normal ratios. Pt admitted for further management.    Today, pt reported still feeling SOB, with coughing spells James some chills. Denies any chest pain, abdominal pain, N/V/D, fever.   Assessment/Plan: Active Problems:   [name] exacerbation   Acute [name] exacerbation  Possible acute [name] exacerbation/??COPD Currently afebrile, with no leukocytosis Requiring about 3 L of supplemental oxygen to maintain sats above 90% Inflammatory markers unremarkable, procalcitonin negative D-dimer mildly elevated at 0.62 Covid test/respiratory viral panel negative Chest x-ray unremarkable PFTs done in March showed mild restriction, with normal ratios Continue IV Solu-Medrol, doxycycline, cough suppressant, duo nebs Continue supplemental oxygen Monitor closely  Type 1 diabetes mellitus A1c 6.5 on 03/2019 Continue home insulin pump Diabetes coordinator on board  James [name] Continue PPI  Hyperlipidemia Continue Zocor        Malnutrition Type:  Nutrition Problem: Increased nutrient needs Etiology: acute illness   Malnutrition Characteristics:  Signs/Symptoms: estimated needs   Nutrition Interventions:  Interventions: Prostat, MVI    Estimated body mass  index is 25.1 kg/m as calculated from the following:   Height as of this encounter: 5\' 9"  (1.753 m).   Weight as of this encounter: 77.1 kg.     Code Status: Full  Family Communication: None at bedside  Disposition Plan: Likely home   Consultants:  None  Procedures:  None  Antimicrobials:  Doxycycline  DVT prophylaxis: Lovenox   Objective: Vitals:   06/29/19 0830 06/29/19 0918 06/29/19 1042 06/29/19 1326  BP: (!) 151/73 (!) 160/74  (!) 147/62  Pulse: 99 97  (!) 102  Resp: 18     Temp:    99 F (37.2 C)  TempSrc:    Oral  SpO2: 97% 95% 94% 91%  Weight:      Height:        Intake/Output Summary (Last 24 hours) at 06/29/2019 1331 Last data filed at 06/29/2019 1000 Gross per 24 hour  Intake 240 ml  Output --  Net 240 ml   Filed Weights   06/28/19 1008  Weight: 77.1 kg    Exam:  General: NAD   Cardiovascular: S1, S2 present  Respiratory:  Diminished breath sounds bilaterally with mild wheezing  Abdomen: Soft, nontender, nondistended, bowel sounds present  Musculoskeletal: No bilateral pedal edema noted  Skin: Normal  Psychiatry: Normal mood    Data Reviewed: CBC: Recent Labs  Lab 06/28/19 1030 06/28/19 1934 06/29/19 0514  WBC 8.8 5.7 7.1  NEUTROABS 8.3*  --   --   HGB 15.1 14.6 14.7  HCT 44.0 43.1 43.5  MCV 93.2 94.9 94.0  PLT 253 272 0000000   Basic Metabolic Panel: Recent Labs  Lab 06/28/19 1030 06/28/19 1934 06/29/19 0514  NA 137  --  136  K 3.7  --  4.1  CL 104  --  104  CO2 24  --  20*  GLUCOSE 198*  --  221*  BUN 22  --  19  CREATININE 0.81 0.85 0.73  CALCIUM 8.8*  --  8.5*   GFR: Estimated Creatinine Clearance: 93.3 mL/min (by C-G formula based on SCr of 0.73 mg/dL). Liver Function Tests: No results for input(s): AST, ALT, ALKPHOS, BILITOT, PROT, ALBUMIN in the last 168 hours. No results for input(s): LIPASE, AMYLASE in the last 168 hours. No results for input(s): AMMONIA in the last 168 hours. Coagulation  Profile: No results for input(s): INR, PROTIME in the last 168 hours. Cardiac Enzymes: No results for input(s): CKTOTAL, CKMB, CKMBINDEX, TROPONINI in the last 168 hours. BNP (last 3 results) No results for input(s): PROBNP in the last 8760 hours. HbA1C: No results for input(s): HGBA1C in the last 72 hours. CBG: No results for input(s): GLUCAP in the last 168 hours. Lipid Profile: No results for input(s): CHOL, HDL, LDLCALC, TRIG, CHOLHDL, LDLDIRECT in the last 72 hours. Thyroid Function Tests: No results for input(s): TSH, T4TOTAL, FREET4, T3FREE, THYROIDAB in the last 72 hours. Anemia Panel: Recent Labs    06/29/19 0938  FERRITIN 41   Urine analysis:    Component Value Date/Time   COLORURINE YELLOW 04/10/2017 0743   APPEARANCEUR CLEAR 04/10/2017 0743   LABSPEC 1.015 04/10/2017 0743   PHURINE 6.0 04/10/2017 0743   GLUCOSEU >=500 (A) 04/10/2017 0743   GLUCOSEU 500 (?) 05/08/2010 [place] 04/10/2017 0743   BILIRUBINUR NEGATIVE 04/10/2017 0743   KETONESUR NEGATIVE 04/10/2017 0743   PROTEINUR NEGATIVE 04/10/2017 0743   UROBILINOGEN 0.2 04/11/2013 0008   NITRITE NEGATIVE 04/10/2017 0743   LEUKOCYTESUR NEGATIVE 04/10/2017 0743   Sepsis Labs: @LABRCNTIP (procalcitonin:4,lacticidven:4)  ) Recent Results (from the past 240 hour(s))  Novel Coronavirus, NAA ([facility])     Status: None   Collection Time: 06/27/19  1:34 PM   Specimen: Nasopharyngeal(NP) swabs in vial transport medium   NASOPHARYNGE  TESTING  Result Value Ref Range Status   SARS-CoV-2, NAA Not Detected Not Detected Final    Comment: This nucleic acid amplification test was developed James its performance characteristics determined by [employer]. Nucleic acid amplification tests include PCR James TMA. This test has not been FDA cleared or approved. This test has been authorized by FDA under an Emergency Use Authorization (EUA). This test is only authorized for the duration of time the  declaration that circumstances exist justifying the authorization of the emergency use of in vitro diagnostic tests for detection of SARS-CoV-2 virus James/or diagnosis of COVID-19 infection under section 564(b)(1) of the Act, 21 U.S.C. [id number]) (1), unless the authorization is terminated or revoked sooner. When diagnostic testing is negative, the possibility of a false negative result should be considered in the context of a patient's recent exposures James the presence of clinical signs James symptoms consistent with COVID-19. An individual without symptoms of COVID-19 James who is not shedding SARS-CoV-2 virus would  expect to have a negative (not detected) result in this assay.   SARS CORONAVIRUS 2 (TAT 6-24 HRS) Nasopharyngeal Nasopharyngeal Swab     Status: None   Collection Time: 06/28/19 12:20 PM   Specimen: Nasopharyngeal Swab  Result Value Ref Range Status   SARS Coronavirus 2 NEGATIVE NEGATIVE Final    Comment: (NOTE) SARS-CoV-2 target nucleic acids are NOT DETECTED. The SARS-CoV-2 RNA is generally detectable in upper James lower respiratory specimens during the acute phase of infection. Negative results do not preclude SARS-CoV-2 infection, do not rule out  co-infections with other pathogens, James should not be used as the sole basis for treatment or other patient management decisions. Negative results must be combined with clinical observations, patient history, James epidemiological information. The expected result is Negative. Fact Sheet for Patients: SugarRoll.be Fact Sheet for Healthcare Providers: https://www.woods-mathews.com/ This test is not yet approved or cleared by the [place] FDA James  has been authorized for detection James/or diagnosis of SARS-CoV-2 by FDA under an Emergency Use Authorization (EUA). This EUA will remain  in effect (meaning this test can be used) for the duration of the COVID-19 declaration under Section 56  4(b)(1) of the Act, 21 U.S.C. section 360bbb-3(b)(1), unless the authorization is terminated or revoked sooner. Performed at [facility], [place] [address], [place], [place] [address]   Respiratory Panel by PCR     Status: None   Collection Time: 06/28/19  6:47 PM   Specimen: Nasopharyngeal Swab; Respiratory  Result Value Ref Range Status   Adenovirus NOT DETECTED NOT DETECTED Final   Coronavirus 229E NOT DETECTED NOT DETECTED Final    Comment: (NOTE) The Coronavirus on the Respiratory Panel, DOES NOT test for the novel  Coronavirus (2019 nCoV)    Coronavirus HKU1 NOT DETECTED NOT DETECTED Final   Coronavirus NL63 NOT DETECTED NOT DETECTED Final   Coronavirus OC43 NOT DETECTED NOT DETECTED Final   Metapneumovirus NOT DETECTED NOT DETECTED Final   Rhinovirus / Enterovirus NOT DETECTED NOT DETECTED Final   Influenza A NOT DETECTED NOT DETECTED Final   Influenza B NOT DETECTED NOT DETECTED Final   Parainfluenza Virus 1 NOT DETECTED NOT DETECTED Final   Parainfluenza Virus 2 NOT DETECTED NOT DETECTED Final   Parainfluenza Virus 3 NOT DETECTED NOT DETECTED Final   Parainfluenza Virus 4 NOT DETECTED NOT DETECTED Final   Respiratory Syncytial Virus NOT DETECTED NOT DETECTED Final   Bordetella pertussis NOT DETECTED NOT DETECTED Final   Chlamydophila pneumoniae NOT DETECTED NOT DETECTED Final   Mycoplasma pneumoniae NOT DETECTED NOT DETECTED Final    Comment: Performed at [facility], [place]. [address], [place], [place] [address]      Studies: No results found.  Scheduled Meds: . doxycycline  100 mg Oral BID  . enoxaparin (LOVENOX) injection  40 mg Subcutaneous Q24H  . feeding supplement (PRO-STAT SUGAR FREE 64)  30 mL Oral TID BM  . fluticasone  2 spray Each Nare Daily  . insulin pump   Subcutaneous TID WC, HS, 0200  . ipratropium-albuterol  3 mL Nebulization TID  . methylPREDNISolone (SOLU-MEDROL) injection  125 mg Intravenous Q8H  . mometasone-formoterol  2 puff  Inhalation BID  . multivitamin with minerals  1 tablet Oral Daily  . pantoprazole  40 mg Oral Daily  . simvastatin  40 mg Oral QHS  . traZODone  150 mg Oral QHS    Continuous Infusions:   LOS: 0 days     Alma Friendly, James [facility]  If 7PM-7AM, please contact night-coverage [email or web address] 06/29/2019, 1:31 PM

## 2019-06-29 NOTE — Progress Notes (Signed)
Inpatient Diabetes Program Recommendations  AACE/ADA: New Consensus Statement on Inpatient Glycemic Control (2015)  Target Ranges:  Prepandial:   less than 140 mg/dL      Peak postprandial:   less than 180 mg/dL (1-2 hours)      Critically ill patients:  140 - 180 mg/dL   Lab Results  Component Value Date   GLUCAP 136 (H) 06/29/2019   HGBA1C 6.5 (H) 04/17/2019    Review of Glycemic Control   Review of Glycemic Control  Diabetes history: DM1 Outpatient Diabetes medications: Insulin Pump Current orders for Inpatient glycemic control: Insulin Pump per home use  Pt states HgbA1C is usually in the 7's. Sees CDE at [name] office Q3 months and his insulin pump settings are adjusted.  Inpatient Diabetes Program Recommendations:     Continue with insulin pump per home settings.  Will follow while inpatient.  Secure text to MD.  Thank you. Lorenda Peck, RD, LDN, CDE Inpatient Diabetes Coordinator [phone]

## 2019-06-30 DIAGNOSIS — J45901 Unspecified asthma with (acute) exacerbation: Secondary | ICD-10-CM

## 2019-06-30 LAB — BRAIN NATRIURETIC PEPTIDE: B Natriuretic Peptide: 48.7 pg/mL (ref 0.0–100.0)

## 2019-06-30 LAB — BASIC METABOLIC PANEL
Anion gap: 8 (ref 5–15)
BUN: 33 mg/dL — ABNORMAL HIGH (ref 8–23)
CO2: 24 mmol/L (ref 22–32)
Calcium: 8.7 mg/dL — ABNORMAL LOW (ref 8.9–10.3)
Chloride: 104 mmol/L (ref 98–111)
Creatinine, Ser: 0.9 mg/dL (ref 0.61–1.24)
GFR calc Af Amer: >60 mL/min (ref >60)
GFR calc non Af Amer: >60 mL/min (ref >60)
Glucose, Bld: 269 mg/dL — ABNORMAL HIGH (ref 70–99)
Potassium: 4.9 mmol/L (ref 3.5–5.1)
Sodium: 136 mmol/L (ref 135–145)

## 2019-06-30 LAB — CBC WITH DIFFERENTIAL/PLATELET
Abs Immature Granulocytes: 0.06 10*3/uL (ref 0.00–0.07)
Basophils Absolute: 0 10*3/uL (ref 0.0–0.1)
Basophils Relative: 0 %
Eosinophils Absolute: 0 10*3/uL (ref 0.0–0.5)
Eosinophils Relative: 0 %
HCT: 42.9 % (ref 39.0–52.0)
Hemoglobin: 14.6 g/dL (ref 13.0–17.0)
Immature Granulocytes: 1 %
Lymphocytes Relative: 4 %
Lymphs Abs: 0.3 10*3/uL — ABNORMAL LOW (ref 0.7–4.0)
MCH: 32.2 pg (ref 26.0–34.0)
MCHC: 34 g/dL (ref 30.0–36.0)
MCV: 94.5 fL (ref 80.0–100.0)
Monocytes Absolute: 0.3 10*3/uL (ref 0.1–1.0)
Monocytes Relative: 3 %
Neutro Abs: 9.2 10*3/uL — ABNORMAL HIGH (ref 1.7–7.7)
Neutrophils Relative %: 92 %
Platelets: 242 10*3/uL (ref 150–400)
RBC: 4.54 MIL/uL (ref 4.22–5.81)
RDW: 12.1 % (ref 11.5–15.5)
WBC: 9.9 10*3/uL (ref 4.0–10.5)
nRBC: 0 % (ref 0.0–0.2)

## 2019-06-30 LAB — GLUCOSE, CAPILLARY
Glucose-Capillary: 223 mg/dL — ABNORMAL HIGH (ref 70–99)
Glucose-Capillary: 239 mg/dL — ABNORMAL HIGH (ref 70–99)
Glucose-Capillary: 113 mg/dL — ABNORMAL HIGH (ref 70–99)
Glucose-Capillary: 143 mg/dL — ABNORMAL HIGH (ref 70–99)

## 2019-06-30 MED ORDER — HYDRALAZINE HCL 10 MG PO TABS
10.0000 mg | ORAL_TABLET | Freq: Four times a day (QID) | ORAL | Status: DC | PRN
  Filled 2019-06-30: qty 1

## 2019-06-30 MED ORDER — LORAZEPAM 2 MG/ML IJ SOLN
0.5000 mg | Freq: Once | INTRAMUSCULAR | Status: AC
  Administered 2019-06-30: 0.5 mg via INTRAVENOUS
  Filled 2019-06-30: qty 1

## 2019-06-30 NOTE — Progress Notes (Signed)
PROGRESS NOTE  James [name] [id number] DOB: 1956-07-07 DOA: 06/28/2019 PCP: James Bowen, MD  HPI/Recap of past 24 hours: HPI from Dr James [name] James [name] is a 63 y.o. male with medical history significant of Asthma, CAD, T1DM (on insulin pump), HLD, GERD, Depression, and Tobacco abuse who presents with worsening shortness of breath, cough, chest tightness/wheezing for about 2 weeks. Pt initially quit smoking tobacco for 2 years, (was on Chantix), but recently started smoking about 3 weeks ago.  Due to worsening symptoms, patient had a telephone visit with Pulmonology, NP James [name], who had prescribed him [facility] and Tapered steroids, without any significant improvement. Patient had PFTs done in March, mild restriction, normal ratios. Pt admitted for further management.     Overnight, patient developed significant respiratory distress with coughing spell/wheezing and subsequently went into panic attack.  Patient improved after nebulizer treatment and bumping up oxygen to 6 L.  Today, patient reports feeling better compared to overnight, still with cough, shortness of breath, denies any chest pain, fever/chills, abdominal pain, nausea/vomiting/diarrhea.   Assessment/Plan: Active Problems:   Asthma exacerbation   Acute asthma exacerbation  Possible acute asthma exacerbation/??COPD Currently afebrile, with no leukocytosis Requiring about 6 L of supplemental oxygen to maintain sats above 90% Inflammatory markers unremarkable, procalcitonin negative D-dimer mildly elevated at 0.62, James consider CTA chest if no significant improvement in the next [place] or so Covid test/respiratory viral panel negative Chest x-ray unremarkable PFTs done in March showed mild restriction, with normal ratios Continue IV Solu-Medrol, doxycycline, cough suppressant, duo nebs Continue supplemental oxygen Monitor closely  Type 1 diabetes mellitus A1c 6.5 on 03/2019 Continue home insulin pump Diabetes  coordinator on board  GERD Continue PPI  Hyperlipidemia Continue Zocor        Malnutrition Type:  Nutrition Problem: Increased nutrient needs Etiology: acute illness   Malnutrition Characteristics:  Signs/Symptoms: estimated needs   Nutrition Interventions:  Interventions: Prostat, MVI    Estimated body mass index is 25.1 kg/m as calculated from the following:   Height as of this encounter: 5\' 9"  (1.753 m).   Weight as of this encounter: 77.1 kg.     Code Status: Full  Family Communication: None at bedside  Disposition Plan: Likely home   Consultants:  None  Procedures:  None  Antimicrobials:  Doxycycline  DVT prophylaxis: Lovenox   Objective: Vitals:   06/30/19 0412 06/30/19 0612 06/30/19 0849 06/30/19 1335  BP:  136/69  (!) 139/115  Pulse:  80  96  Resp:  18  19  Temp:  (!) 97.5 F (36.4 C)  98.2 F (36.8 C)  TempSrc:  Oral  Oral  SpO2: 95% 94% 92% 92%  Weight:      Height:        Intake/Output Summary (Last 24 hours) at 06/30/2019 1409 Last data filed at 06/30/2019 1246 Gross per 24 hour  Intake 960 ml  Output 925 ml  Net 35 ml   Filed Weights   06/28/19 1008  Weight: 77.1 kg    Exam:  General: NAD   Cardiovascular: S1, S2 present  Respiratory:  Diminished breath sounds bilaterally  Abdomen: Soft, nontender, nondistended, bowel sounds present  Musculoskeletal: No bilateral pedal edema noted  Skin: Normal  Psychiatry: Normal mood   Data Reviewed: CBC: Recent Labs  Lab 06/28/19 1030 06/28/19 1934 06/29/19 0514 06/30/19 0619  WBC 8.8 5.7 7.1 9.9  NEUTROABS 8.3*  --   --  9.2*  HGB 15.1 14.6 14.7 14.6  HCT  44.0 43.1 43.5 42.9  MCV 93.2 94.9 94.0 94.5  PLT 253 272 254 [id number]   Basic Metabolic Panel: Recent Labs  Lab 06/28/19 1030 06/28/19 1934 06/29/19 0514 06/30/19 0619  NA 137  --  136 136  K 3.7  --  4.1 4.9  CL 104  --  104 104  CO2 24  --  20* 24  GLUCOSE 198*  --  221* 269*  BUN 22  --   19 33*  CREATININE 0.81 0.85 0.73 0.90  CALCIUM 8.8*  --  8.5* 8.7*   GFR: Estimated Creatinine Clearance: 82.9 mL/min (by C-G formula based on SCr of 0.9 mg/dL). Liver Function Tests: No results for input(s): AST, ALT, ALKPHOS, BILITOT, PROT, ALBUMIN in the last 168 hours. No results for input(s): LIPASE, AMYLASE in the last 168 hours. No results for input(s): AMMONIA in the last 168 hours. Coagulation Profile: No results for input(s): INR, PROTIME in the last 168 hours. Cardiac Enzymes: No results for input(s): CKTOTAL, CKMB, CKMBINDEX, TROPONINI in the last 168 hours. BNP (last 3 results) No results for input(s): PROBNP in the last 8760 hours. HbA1C: No results for input(s): HGBA1C in the last 72 hours. CBG: Recent Labs  Lab 06/29/19 1625 06/30/19 0743 06/30/19 1128  GLUCAP 136* 223* 239*   Lipid Profile: No results for input(s): CHOL, HDL, LDLCALC, TRIG, CHOLHDL, LDLDIRECT in the last 72 hours. Thyroid Function Tests: No results for input(s): TSH, T4TOTAL, FREET4, T3FREE, THYROIDAB in the last 72 hours. Anemia Panel: Recent Labs    06/29/19 0938  FERRITIN 41   Urine analysis:    Component Value Date/Time   COLORURINE YELLOW 04/10/2017 0743   APPEARANCEUR CLEAR 04/10/2017 0743   LABSPEC 1.015 04/10/2017 0743   PHURINE 6.0 04/10/2017 0743   GLUCOSEU >=500 (A) 04/10/2017 0743   GLUCOSEU 500 (?) 05/08/2010 [place] 04/10/2017 0743   BILIRUBINUR NEGATIVE 04/10/2017 0743   KETONESUR NEGATIVE 04/10/2017 0743   PROTEINUR NEGATIVE 04/10/2017 0743   UROBILINOGEN 0.2 04/11/2013 0008   NITRITE NEGATIVE 04/10/2017 0743   LEUKOCYTESUR NEGATIVE 04/10/2017 0743   Sepsis Labs: @LABRCNTIP (procalcitonin:4,lacticidven:4)  ) Recent Results (from the past 240 hour(s))  Novel Coronavirus, NAA ([facility])     Status: None   Collection Time: 06/27/19  1:34 PM   Specimen: Nasopharyngeal(NP) swabs in vial transport medium   NASOPHARYNGE  TESTING  Result Value Ref  Range Status   SARS-CoV-2, NAA Not Detected Not Detected Final    Comment: This nucleic acid amplification test was developed and its performance characteristics determined by [employer]. Nucleic acid amplification tests include PCR and TMA. This test has not been FDA cleared or approved. This test has been authorized by FDA under an Emergency Use Authorization (EUA). This test is only authorized for the duration of time the declaration that circumstances exist justifying the authorization of the emergency use of in vitro diagnostic tests for detection of SARS-CoV-2 virus and/or diagnosis of COVID-19 infection under section 564(b)(1) of the Act, 21 U.S.C. [id number]) (1), unless the authorization is terminated or revoked sooner. When diagnostic testing is negative, the possibility of a false negative result should be considered in the context of a patient's recent exposures and the presence of clinical signs and symptoms consistent with COVID-19. An individual without symptoms of COVID-19 and who is not shedding SARS-CoV-2 virus would  expect to have a negative (not detected) result in this assay.   SARS CORONAVIRUS 2 (TAT 6-24 HRS) Nasopharyngeal Nasopharyngeal Swab  Status: None   Collection Time: 06/28/19 12:20 PM   Specimen: Nasopharyngeal Swab  Result Value Ref Range Status   SARS Coronavirus 2 NEGATIVE NEGATIVE Final    Comment: (NOTE) SARS-CoV-2 target nucleic acids are NOT DETECTED. The SARS-CoV-2 RNA is generally detectable in upper and lower respiratory specimens during the acute phase of infection. Negative results do not preclude SARS-CoV-2 infection, do not rule out co-infections with other pathogens, and should not be used as the sole basis for treatment or other patient management decisions. Negative results must be combined with clinical observations, patient history, and epidemiological information. The expected result is Negative. Fact Sheet for  Patients: SugarRoll.be Fact Sheet for Healthcare Providers: https://www.woods-mathews.com/ This test is not yet approved or cleared by the [place] FDA and  has been authorized for detection and/or diagnosis of SARS-CoV-2 by FDA under an Emergency Use Authorization (EUA). This EUA James remain  in effect (meaning this test can be used) for the duration of the COVID-19 declaration under Section 56 4(b)(1) of the Act, 21 U.S.C. section 360bbb-3(b)(1), unless the authorization is terminated or revoked sooner. Performed at [facility], [place] [address], [place], [place] [address]   Respiratory Panel by PCR     Status: None   Collection Time: 06/28/19  6:47 PM   Specimen: Nasopharyngeal Swab; Respiratory  Result Value Ref Range Status   Adenovirus NOT DETECTED NOT DETECTED Final   Coronavirus 229E NOT DETECTED NOT DETECTED Final    Comment: (NOTE) The Coronavirus on the Respiratory Panel, DOES NOT test for the novel  Coronavirus (2019 nCoV)    Coronavirus HKU1 NOT DETECTED NOT DETECTED Final   Coronavirus NL63 NOT DETECTED NOT DETECTED Final   Coronavirus OC43 NOT DETECTED NOT DETECTED Final   Metapneumovirus NOT DETECTED NOT DETECTED Final   Rhinovirus / Enterovirus NOT DETECTED NOT DETECTED Final   Influenza A NOT DETECTED NOT DETECTED Final   Influenza B NOT DETECTED NOT DETECTED Final   Parainfluenza Virus 1 NOT DETECTED NOT DETECTED Final   Parainfluenza Virus 2 NOT DETECTED NOT DETECTED Final   Parainfluenza Virus 3 NOT DETECTED NOT DETECTED Final   Parainfluenza Virus 4 NOT DETECTED NOT DETECTED Final   Respiratory Syncytial Virus NOT DETECTED NOT DETECTED Final   Bordetella pertussis NOT DETECTED NOT DETECTED Final   Chlamydophila pneumoniae NOT DETECTED NOT DETECTED Final   Mycoplasma pneumoniae NOT DETECTED NOT DETECTED Final    Comment: Performed at [facility], [place]. [address] [place], [place] [address]       Studies: No results found.  Scheduled Meds: . dextromethorphan-guaiFENesin  1 tablet Oral BID  . doxycycline  100 mg Oral BID  . enoxaparin (LOVENOX) injection  40 mg Subcutaneous Q24H  . feeding supplement (PRO-STAT SUGAR FREE 64)  30 mL Oral TID BM  . fluticasone  2 spray Each Nare Daily  . insulin pump   Subcutaneous TID WC, HS, 0200  . ipratropium-albuterol  3 mL Nebulization TID  . loratadine  10 mg Oral Daily  . methylPREDNISolone (SOLU-MEDROL) injection  60 mg Intravenous Q8H  . mometasone-formoterol  2 puff Inhalation BID  . multivitamin with minerals  1 tablet Oral Daily  . pantoprazole  40 mg Oral Daily  . simvastatin  40 mg Oral QHS  . traZODone  150 mg Oral QHS    Continuous Infusions:   LOS: 1 [place]     Alma Friendly, MD [facility]  If 7PM-7AM, please contact night-coverage [email or web address] 06/30/2019, 2:09  PM

## 2019-06-30 NOTE — Plan of Care (Signed)
  Problem: Education: ?Goal: Knowledge of disease or condition will improve ?Outcome: Progressing ?Goal: Knowledge of the prescribed therapeutic regimen will improve ?Outcome: Progressing ?Goal: Individualized Educational Video(s) ?Outcome: Progressing ?  ?Problem: Activity: ?Goal: Ability to tolerate increased activity will improve ?Outcome: Progressing ?Goal: Will verbalize the importance of balancing activity with adequate rest periods ?Outcome: Progressing ?  ?Problem: Respiratory: ?Goal: Ability to maintain a clear airway will improve ?Outcome: Progressing ?  ?

## 2019-07-01 LAB — GLUCOSE, CAPILLARY
Glucose-Capillary: 96 mg/dL (ref 70–99)
Glucose-Capillary: 101 mg/dL — ABNORMAL HIGH (ref 70–99)
Glucose-Capillary: 216 mg/dL — ABNORMAL HIGH (ref 70–99)
Glucose-Capillary: 87 mg/dL (ref 70–99)
Glucose-Capillary: 234 mg/dL — ABNORMAL HIGH (ref 70–99)

## 2019-07-01 MED ORDER — ALPRAZOLAM 0.25 MG PO TABS
0.2500 mg | ORAL_TABLET | Freq: Three times a day (TID) | ORAL | Status: DC | PRN
  Administered 2019-07-01 – 2019-07-08 (×12): 0.25 mg via ORAL
  Filled 2019-07-01 (×12): qty 1

## 2019-07-01 MED ORDER — INSULIN ASPART 100 UNIT/ML ~~LOC~~ SOLN
0.0000 [IU] | SUBCUTANEOUS | Status: DC
  Administered 2019-07-01: 22:00:00 5 [IU] via SUBCUTANEOUS
  Administered 2019-07-02: 3 [IU] via SUBCUTANEOUS
  Administered 2019-07-02: 01:00:00 8 [IU] via SUBCUTANEOUS
  Administered 2019-07-02: 5 [IU] via SUBCUTANEOUS
  Administered 2019-07-02: 21:00:00 3 [IU] via SUBCUTANEOUS
  Administered 2019-07-02: 2 [IU] via SUBCUTANEOUS
  Administered 2019-07-02: 11 [IU] via SUBCUTANEOUS
  Administered 2019-07-03 (×3): 3 [IU] via SUBCUTANEOUS

## 2019-07-01 MED ORDER — INSULIN GLARGINE 100 UNIT/ML ~~LOC~~ SOLN
5.0000 [IU] | Freq: Every day | SUBCUTANEOUS | Status: DC
  Administered 2019-07-01 – 2019-07-03 (×3): 5 [IU] via SUBCUTANEOUS
  Filled 2019-07-01 (×3): qty 0.05

## 2019-07-01 MED ORDER — INSULIN ASPART 100 UNIT/ML ~~LOC~~ SOLN: 0.0000 [IU] | Freq: Every day | SUBCUTANEOUS | Status: DC

## 2019-07-01 MED ORDER — LORAZEPAM 2 MG/ML IJ SOLN
0.5000 mg | Freq: Every evening | INTRAMUSCULAR | Status: DC | PRN
  Administered 2019-07-01 – 2019-07-08 (×7): 0.5 mg via INTRAVENOUS
  Filled 2019-07-01 (×7): qty 1

## 2019-07-01 MED ORDER — INSULIN ASPART 100 UNIT/ML ~~LOC~~ SOLN: 0.0000 [IU] | Freq: Three times a day (TID) | SUBCUTANEOUS | Status: DC

## 2019-07-01 NOTE — Progress Notes (Signed)
PROGRESS NOTE  James [name] [id number] DOB: [birth year] DOA: 06/28/2019 PCP: [name], MD  HPI/Recap of past 24 hours: HPI from Dr [name] James [name] is a 63 y.o. male with medical history significant of Asthma, CAD, T1DM (on insulin pump), HLD, GERD, Depression, and Tobacco abuse who presents with worsening shortness of breath, cough, chest tightness/wheezing for about 2 weeks. Pt initially quit smoking tobacco for 2 years, (was on Chantix), but recently started smoking about 3 weeks ago.  Due to worsening symptoms, patient had a telephone visit with Pulmonology, NP [name], who had prescribed him [facility] and Tapered steroids, without any significant improvement. Patient had PFTs done in March, mild restriction, normal ratios. Pt admitted for further management.  On the second [place] after his admission, patient developed significant respiratory distress with coughing spell/wheezing and subsequently went into panic attack.  Patient improved after nebulizer treatment and bumping up oxygen to 6 L.     Subjective:  He still feels short of breath with a cough not as productive but he feels like he has chest congestion. His O2 supplementation requirement is improving, he is down to 4L/min via [place] from 6L.   Assessment/Plan: Active Problems:   Asthma exacerbation   Acute asthma exacerbation  Possible acute asthma exacerbation/??COPD Currently afebrile, with no leukocytosis Requiring up to 6 L of supplemental oxygen to maintain sats above 90% but being weaned off. He does not use supplementation O2 at baseline.  Inflammatory markers unremarkable, procalcitonin negative D-dimer mildly elevated at 0.62, may consider CTA.  Covid test/respiratory viral panel negative Chest x-ray unremarkable PFTs done in March showed mild restriction, with normal ratios Continue IV Solu-Medrol, doxycycline, cough suppressant, duo nebs Continue supplemental oxygen Start low dose xanax po as needed for  anxiety and for shortness of breath.  Start IS, chest percussion with RT consultation.  Patient would like to contact his Pulmonologist's office tomorrow, he sees Dr. Elsworth Soho. He declines formal consultation for inpatient Pulmonology at this time.   Type 1 diabetes mellitus A1c 6.5 on 03/2019 Continue home insulin pump Diabetes coordinator on board  GERD Continue PPI  Hyperlipidemia Continue Zocor   Malnutrition Type:  Nutrition Problem: Increased nutrient needs Etiology: acute illness   Malnutrition Characteristics:  Signs/Symptoms: estimated needs   Nutrition Interventions:  Interventions: Prostat, MVI    Estimated body mass index is 26.14 kg/m as calculated from the following:   Height as of this encounter: 5\' 9"  (1.753 m).   Weight as of this encounter: 80.3 kg.     Code Status: Full  Family Communication: None at bedside  Disposition Plan: Likely home   Consultants:  None  Procedures:  None  Antimicrobials:  Doxycycline  DVT prophylaxis: Lovenox   Objective: Vitals:   07/01/19 1157 07/01/19 1356 07/01/19 1356 07/01/19 1500  BP:  134/71    Pulse:  79    Resp:  20    Temp:   97.8 F (36.6 C)   TempSrc:   Oral   SpO2: (!) 88% 94%  91%  Weight:      Height:        Intake/Output Summary (Last 24 hours) at 07/01/2019 1550 Last data filed at 07/01/2019 1421 Gross per 24 hour  Intake 1434 ml  Output 1291 ml  Net 143 ml   Filed Weights   06/28/19 1008 07/01/19 0556  Weight: 77.1 kg 80.3 kg    Exam:  General: NAD   Cardiovascular: S1, S2 present  Respiratory:  Diminished breath  sounds bilaterally  Abdomen: Soft, nontender, nondistended, bowel sounds present  Musculoskeletal: No bilateral pedal edema noted  Skin: Normal  Psychiatry: Normal mood   Data Reviewed: CBC: Recent Labs  Lab 06/28/19 1030 06/28/19 1934 06/29/19 0514 06/30/19 0619  WBC 8.8 5.7 7.1 9.9  NEUTROABS 8.3*  --   --  9.2*  HGB 15.1 14.6 14.7 14.6   HCT 44.0 43.1 43.5 42.9  MCV 93.2 94.9 94.0 94.5  PLT 253 272 254 [id number]   Basic Metabolic Panel: Recent Labs  Lab 06/28/19 1030 06/28/19 1934 06/29/19 0514 06/30/19 0619  NA 137  --  136 136  K 3.7  --  4.1 4.9  CL 104  --  104 104  CO2 24  --  20* 24  GLUCOSE 198*  --  221* 269*  BUN 22  --  19 33*  CREATININE 0.81 0.85 0.73 0.90  CALCIUM 8.8*  --  8.5* 8.7*   GFR: Estimated Creatinine Clearance: 82.9 mL/min (by C-G formula based on SCr of 0.9 mg/dL). Liver Function Tests: No results for input(s): AST, ALT, ALKPHOS, BILITOT, PROT, ALBUMIN in the last 168 hours. No results for input(s): LIPASE, AMYLASE in the last 168 hours. No results for input(s): AMMONIA in the last 168 hours. Coagulation Profile: No results for input(s): INR, PROTIME in the last 168 hours. Cardiac Enzymes: No results for input(s): CKTOTAL, CKMB, CKMBINDEX, TROPONINI in the last 168 hours. BNP (last 3 results) No results for input(s): PROBNP in the last 8760 hours. HbA1C: No results for input(s): HGBA1C in the last 72 hours. CBG: Recent Labs  Lab 06/30/19 1649 06/30/19 2047 07/01/19 0252 07/01/19 0757 07/01/19 1144  GLUCAP 113* 143* 96 101* 216*   Lipid Profile: No results for input(s): CHOL, HDL, LDLCALC, TRIG, CHOLHDL, LDLDIRECT in the last 72 hours. Thyroid Function Tests: No results for input(s): TSH, T4TOTAL, FREET4, T3FREE, THYROIDAB in the last 72 hours. Anemia Panel: Recent Labs    06/29/19 0938  FERRITIN 41   Urine analysis:    Component Value Date/Time   COLORURINE YELLOW 04/10/2017 0743   APPEARANCEUR CLEAR 04/10/2017 0743   LABSPEC 1.015 04/10/2017 0743   PHURINE 6.0 04/10/2017 0743   GLUCOSEU >=500 (A) 04/10/2017 0743   GLUCOSEU 500 (?) 05/08/2010 [place] 04/10/2017 0743   BILIRUBINUR NEGATIVE 04/10/2017 0743   KETONESUR NEGATIVE 04/10/2017 0743   PROTEINUR NEGATIVE 04/10/2017 0743   UROBILINOGEN 0.2 04/11/2013 0008   NITRITE NEGATIVE 04/10/2017 0743    LEUKOCYTESUR NEGATIVE 04/10/2017 0743   Sepsis Labs: @LABRCNTIP (procalcitonin:4,lacticidven:4)  ) Recent Results (from the past 240 hour(s))  Novel Coronavirus, NAA ([facility])     Status: None   Collection Time: 06/27/19  1:34 PM   Specimen: Nasopharyngeal(NP) swabs in vial transport medium   NASOPHARYNGE  TESTING  Result Value Ref Range Status   SARS-CoV-2, NAA Not Detected Not Detected Final    Comment: This nucleic acid amplification test was developed and its performance characteristics determined by [employer]. Nucleic acid amplification tests include PCR and TMA. This test has not been FDA cleared or approved. This test has been authorized by FDA under an Emergency Use Authorization (EUA). This test is only authorized for the duration of time the declaration that circumstances exist justifying the authorization of the emergency use of in vitro diagnostic tests for detection of SARS-CoV-2 virus and/or diagnosis of COVID-19 infection under section 564(b)(1) of the Act, 21 U.S.C. [id number]) (1), unless the authorization is terminated or revoked sooner. When diagnostic testing  is negative, the possibility of a false negative result should be considered in the context of a patient's recent exposures and the presence of clinical signs and symptoms consistent with COVID-19. An individual without symptoms of COVID-19 and who is not shedding SARS-CoV-2 virus would  expect to have a negative (not detected) result in this assay.   SARS CORONAVIRUS 2 (TAT 6-24 HRS) Nasopharyngeal Nasopharyngeal Swab     Status: None   Collection Time: 06/28/19 12:20 PM   Specimen: Nasopharyngeal Swab  Result Value Ref Range Status   SARS Coronavirus 2 NEGATIVE NEGATIVE Final    Comment: (NOTE) SARS-CoV-2 target nucleic acids are NOT DETECTED. The SARS-CoV-2 RNA is generally detectable in upper and lower respiratory specimens during the acute phase of infection. Negative results do not  preclude SARS-CoV-2 infection, do not rule out co-infections with other pathogens, and should not be used as the sole basis for treatment or other patient management decisions. Negative results must be combined with clinical observations, patient history, and epidemiological information. The expected result is Negative. Fact Sheet for Patients: SugarRoll.be Fact Sheet for Healthcare Providers: https://www.woods-mathews.com/ This test is not yet approved or cleared by the [place] FDA and  has been authorized for detection and/or diagnosis of SARS-CoV-2 by FDA under an Emergency Use Authorization (EUA). This EUA will remain  in effect (meaning this test can be used) for the duration of the COVID-19 declaration under Section 56 4(b)(1) of the Act, 21 U.S.C. section 360bbb-3(b)(1), unless the authorization is terminated or revoked sooner. Performed at [facility], [place] [address], [place], [place] [address]   Respiratory Panel by PCR     Status: None   Collection Time: 06/28/19  6:47 PM   Specimen: Nasopharyngeal Swab; Respiratory  Result Value Ref Range Status   Adenovirus NOT DETECTED NOT DETECTED Final   Coronavirus 229E NOT DETECTED NOT DETECTED Final    Comment: (NOTE) The Coronavirus on the Respiratory Panel, DOES NOT test for the novel  Coronavirus (2019 nCoV)    Coronavirus HKU1 NOT DETECTED NOT DETECTED Final   Coronavirus NL63 NOT DETECTED NOT DETECTED Final   Coronavirus OC43 NOT DETECTED NOT DETECTED Final   Metapneumovirus NOT DETECTED NOT DETECTED Final   Rhinovirus / Enterovirus NOT DETECTED NOT DETECTED Final   Influenza A NOT DETECTED NOT DETECTED Final   Influenza B NOT DETECTED NOT DETECTED Final   Parainfluenza Virus 1 NOT DETECTED NOT DETECTED Final   Parainfluenza Virus 2 NOT DETECTED NOT DETECTED Final   Parainfluenza Virus 3 NOT DETECTED NOT DETECTED Final   Parainfluenza Virus 4 NOT DETECTED NOT DETECTED  Final   Respiratory Syncytial Virus NOT DETECTED NOT DETECTED Final   Bordetella pertussis NOT DETECTED NOT DETECTED Final   Chlamydophila pneumoniae NOT DETECTED NOT DETECTED Final   Mycoplasma pneumoniae NOT DETECTED NOT DETECTED Final    Comment: Performed at [facility], [place]. [address], [place], [place] [address]      Studies: No results found.  Scheduled Meds: . dextromethorphan-guaiFENesin  1 tablet Oral BID  . doxycycline  100 mg Oral BID  . enoxaparin (LOVENOX) injection  40 mg Subcutaneous Q24H  . feeding supplement (PRO-STAT SUGAR FREE 64)  30 mL Oral TID BM  . fluticasone  2 spray Each Nare Daily  . insulin pump   Subcutaneous TID WC, HS, 0200  . ipratropium-albuterol  3 mL Nebulization TID  . loratadine  10 mg Oral Daily  . methylPREDNISolone (SOLU-MEDROL) injection  60 mg Intravenous  Q8H  . mometasone-formoterol  2 puff Inhalation BID  . multivitamin with minerals  1 tablet Oral Daily  . pantoprazole  40 mg Oral Daily  . simvastatin  40 mg Oral QHS  . traZODone  150 mg Oral QHS    Continuous Infusions:   LOS: 2 days     Blain Pais, MD [facility]  If 7PM-7AM, please contact night-coverage [email or web address] 07/01/2019, 3:50 PM

## 2019-07-01 NOTE — Plan of Care (Signed)
Called by RN that insulin pump broke. Placed on SSI sensitive per request.     Estill Cotta M.D. [facility] 07/01/2019, 7:20 PM  Pager: [phone]

## 2019-07-02 ENCOUNTER — Telehealth: Payer: Self-pay | Admitting: *Deleted

## 2019-07-02 ENCOUNTER — Ambulatory Visit: Payer: 59 | Admitting: Primary Care

## 2019-07-02 LAB — GLUCOSE, CAPILLARY
Glucose-Capillary: 141 mg/dL — ABNORMAL HIGH (ref 70–99)
Glucose-Capillary: 190 mg/dL — ABNORMAL HIGH (ref 70–99)
Glucose-Capillary: 192 mg/dL — ABNORMAL HIGH (ref 70–99)
Glucose-Capillary: 236 mg/dL — ABNORMAL HIGH (ref 70–99)
Glucose-Capillary: 262 mg/dL — ABNORMAL HIGH (ref 70–99)
Glucose-Capillary: 314 mg/dL — ABNORMAL HIGH (ref 70–99)

## 2019-07-02 NOTE — Telephone Encounter (Signed)
Tried to contact x 3 for a Televisit this am. No answer. Please reschedule if he calls back.

## 2019-07-02 NOTE — Progress Notes (Addendum)
PROGRESS NOTE  James [name] [id number] DOB: Oct 10, 1955 DOA: 06/28/2019 PCP: James Bowen, MD  HPI/Recap of past 24 hours: HPI from James [name] is a 63 y.o. male with medical history significant of Asthma, CAD, T1DM (on insulin pump), HLD, GERD, Depression, and Tobacco abuse who presents with worsening shortness of breath, cough, chest tightness/wheezing for about 2 weeks. Pt initially quit smoking tobacco for 2 years, (was on Chantix), but recently started smoking about 3 weeks ago.  Due to worsening symptoms, patient had a telephone visit with Pulmonology, NP [name], who had prescribed him [facility] and Tapered steroids, without any significant improvement. Patient had PFTs done in March, mild restriction, normal ratios. Pt admitted for further management.  On the second [place] after his admission, patient developed significant respiratory distress with coughing spell/wheezing and subsequently went into panic attack.  Patient improved after nebulizer treatment and bumping up oxygen to 6 L.     Subjective:  His insulin pump stopped working last night but he is on SSI now. Reports that he continues to have DOE.   Assessment/Plan: Active Problems:   Asthma exacerbation   Acute asthma exacerbation  Acute respiratory failure with hypoxia Asthma exacerbation  Currently afebrile, with no leukocytosis Requiring up to 6 L of supplemental oxygen to maintain sats above 90% but being weaned off. He does not use supplementation O2 at baseline.  Inflammatory markers unremarkable, procalcitonin negative D-dimer mildly elevated at 0.62, will order CTA chest.  Covid test/respiratory viral panel negative Chest x-ray unremarkable PFTs done in March showed mild restriction, with normal ratios Continue IV Solu-Medrol, doxycycline, cough suppressant, duo nebs Continue supplemental oxygen Start low dose xanax po as needed for anxiety and for shortness of breath.  Start IS, chest  percussion with RT consultation.  -May need Pulmonology consultation   Type 1 diabetes mellitus A1c 6.5 on 03/2019 Home insulin pump malfunctioned last night.  Continue SSI and Lantus. May need to change SSI to sensitive and start meal coverage insulin but will monitor CBG for now.  Diabetes coordinator on board  GERD Continue PPI  Hyperlipidemia Continue Zocor   Malnutrition Type:  Nutrition Problem: Increased nutrient needs Etiology: acute illness   Malnutrition Characteristics:  Signs/Symptoms: estimated needs   Nutrition Interventions:  Interventions: Prostat, MVI    Estimated body mass index is 26.24 kg/m as calculated from the following:   Height as of this encounter: 5\' 9"  (1.753 m).   Weight as of this encounter: 80.6 kg.     Code Status: Full  Family Communication: None at bedside  Disposition Plan: Likely home   Consultants:  None  Procedures:  None  Antimicrobials:  Doxycycline  DVT prophylaxis: Lovenox   Objective: Vitals:   07/02/19 1355 07/02/19 1601 07/02/19 1903 07/02/19 2001  BP:  (!) 120/57  118/64  Pulse:  90  78  Resp:  18  18  Temp:  97.8 F (36.6 C)  97.7 F (36.5 C)  TempSrc:  Oral  Oral  SpO2: 92% 93% 95% 91%  Weight:      Height:        Intake/Output Summary (Last 24 hours) at 07/02/2019 2043 Last data filed at 07/02/2019 1832 Gross per 24 hour  Intake 855 ml  Output 1150 ml  Net -295 ml   Filed Weights   06/28/19 1008 07/01/19 0556 07/02/19 0532  Weight: 77.1 kg 80.3 kg 80.6 kg    Exam:  General: NAD   Cardiovascular: S1, S2 present  Respiratory:  Diminished breath sounds bilaterally  Abdomen: Soft, nontender, nondistended, bowel sounds present  Musculoskeletal: No bilateral pedal edema noted  Skin: Normal  Psychiatry: Normal mood   Data Reviewed: CBC: Recent Labs  Lab 06/28/19 1030 06/28/19 1934 06/29/19 0514 06/30/19 0619  WBC 8.8 5.7 7.1 9.9  NEUTROABS 8.3*  --   --  9.2*    HGB 15.1 14.6 14.7 14.6  HCT 44.0 43.1 43.5 42.9  MCV 93.2 94.9 94.0 94.5  PLT 253 272 254 [id number]   Basic Metabolic Panel: Recent Labs  Lab 06/28/19 1030 06/28/19 1934 06/29/19 0514 06/30/19 0619  NA 137  --  136 136  K 3.7  --  4.1 4.9  CL 104  --  104 104  CO2 24  --  20* 24  GLUCOSE 198*  --  221* 269*  BUN 22  --  19 33*  CREATININE 0.81 0.85 0.73 0.90  CALCIUM 8.8*  --  8.5* 8.7*   GFR: Estimated Creatinine Clearance: 82.9 mL/min (by C-G formula based on SCr of 0.9 mg/dL). Liver Function Tests: No results for input(s): AST, ALT, ALKPHOS, BILITOT, PROT, ALBUMIN in the last 168 hours. No results for input(s): LIPASE, AMYLASE in the last 168 hours. No results for input(s): AMMONIA in the last 168 hours. Coagulation Profile: No results for input(s): INR, PROTIME in the last 168 hours. Cardiac Enzymes: No results for input(s): CKTOTAL, CKMB, CKMBINDEX, TROPONINI in the last 168 hours. BNP (last 3 results) No results for input(s): PROBNP in the last 8760 hours. HbA1C: No results for input(s): HGBA1C in the last 72 hours. CBG: Recent Labs  Lab 07/02/19 0351 07/02/19 0725 07/02/19 1227 07/02/19 1621 07/02/19 1958  GLUCAP 190* 141* 314* 236* 192*   Lipid Profile: No results for input(s): CHOL, HDL, LDLCALC, TRIG, CHOLHDL, LDLDIRECT in the last 72 hours. Thyroid Function Tests: No results for input(s): TSH, T4TOTAL, FREET4, T3FREE, THYROIDAB in the last 72 hours. Anemia Panel: No results for input(s): VITAMINB12, FOLATE, FERRITIN, TIBC, IRON, RETICCTPCT in the last 72 hours. Urine analysis:    Component Value Date/Time   COLORURINE YELLOW 04/10/2017 [place] 04/10/2017 0743   LABSPEC 1.015 04/10/2017 0743   PHURINE 6.0 04/10/2017 0743   GLUCOSEU >=500 (A) 04/10/2017 0743   GLUCOSEU 500 (?) 05/08/2010 [place] 04/10/2017 0743   BILIRUBINUR NEGATIVE 04/10/2017 0743   KETONESUR NEGATIVE 04/10/2017 0743   PROTEINUR NEGATIVE 04/10/2017  0743   UROBILINOGEN 0.2 04/11/2013 0008   NITRITE NEGATIVE 04/10/2017 0743   LEUKOCYTESUR NEGATIVE 04/10/2017 0743   Sepsis Labs: @LABRCNTIP (procalcitonin:4,lacticidven:4)  ) Recent Results (from the past 240 hour(s))  Novel Coronavirus, NAA ([facility])     Status: None   Collection Time: 06/27/19  1:34 PM   Specimen: Nasopharyngeal(NP) swabs in vial transport medium   NASOPHARYNGE  TESTING  Result Value Ref Range Status   SARS-CoV-2, NAA Not Detected Not Detected Final    Comment: This nucleic acid amplification test was developed and its performance characteristics determined by [employer]. Nucleic acid amplification tests include PCR and TMA. This test has not been FDA cleared or approved. This test has been authorized by FDA under an Emergency Use Authorization (EUA). This test is only authorized for the duration of time the declaration that circumstances exist justifying the authorization of the emergency use of in vitro diagnostic tests for detection of SARS-CoV-2 virus and/or diagnosis of COVID-19 infection under section 564(b)(1) of the Act, 21 U.S.C. [id number]) (1), unless the authorization  is terminated or revoked sooner. When diagnostic testing is negative, the possibility of a false negative result should be considered in the context of a patient's recent exposures and the presence of clinical signs and symptoms consistent with COVID-19. An individual without symptoms of COVID-19 and who is not shedding SARS-CoV-2 virus would  expect to have a negative (not detected) result in this assay.   SARS CORONAVIRUS 2 (TAT 6-24 HRS) Nasopharyngeal Nasopharyngeal Swab     Status: None   Collection Time: 06/28/19 12:20 PM   Specimen: Nasopharyngeal Swab  Result Value Ref Range Status   SARS Coronavirus 2 NEGATIVE NEGATIVE Final    Comment: (NOTE) SARS-CoV-2 target nucleic acids are NOT DETECTED. The SARS-CoV-2 RNA is generally detectable in upper and  lower respiratory specimens during the acute phase of infection. Negative results do not preclude SARS-CoV-2 infection, do not rule out co-infections with other pathogens, and should not be used as the sole basis for treatment or other patient management decisions. Negative results must be combined with clinical observations, patient history, and epidemiological information. The expected result is Negative. Fact Sheet for Patients: SugarRoll.be Fact Sheet for Healthcare Providers: https://www.woods-mathews.com/ This test is not yet approved or cleared by the [place] FDA and  has been authorized for detection and/or diagnosis of SARS-CoV-2 by FDA under an Emergency Use Authorization (EUA). This EUA will remain  in effect (meaning this test can be used) for the duration of the COVID-19 declaration under Section 56 4(b)(1) of the Act, 21 U.S.C. section 360bbb-3(b)(1), unless the authorization is terminated or revoked sooner. Performed at [facility], [place] [address], [place], [place] [address]   Respiratory Panel by PCR     Status: None   Collection Time: 06/28/19  6:47 PM   Specimen: Nasopharyngeal Swab; Respiratory  Result Value Ref Range Status   Adenovirus NOT DETECTED NOT DETECTED Final   Coronavirus 229E NOT DETECTED NOT DETECTED Final    Comment: (NOTE) The Coronavirus on the Respiratory Panel, DOES NOT test for the novel  Coronavirus (2019 nCoV)    Coronavirus HKU1 NOT DETECTED NOT DETECTED Final   Coronavirus NL63 NOT DETECTED NOT DETECTED Final   Coronavirus OC43 NOT DETECTED NOT DETECTED Final   Metapneumovirus NOT DETECTED NOT DETECTED Final   Rhinovirus / Enterovirus NOT DETECTED NOT DETECTED Final   Influenza A NOT DETECTED NOT DETECTED Final   Influenza B NOT DETECTED NOT DETECTED Final   Parainfluenza Virus 1 NOT DETECTED NOT DETECTED Final   Parainfluenza Virus 2 NOT DETECTED NOT DETECTED Final   Parainfluenza  Virus 3 NOT DETECTED NOT DETECTED Final   Parainfluenza Virus 4 NOT DETECTED NOT DETECTED Final   Respiratory Syncytial Virus NOT DETECTED NOT DETECTED Final   Bordetella pertussis NOT DETECTED NOT DETECTED Final   Chlamydophila pneumoniae NOT DETECTED NOT DETECTED Final   Mycoplasma pneumoniae NOT DETECTED NOT DETECTED Final    Comment: Performed at [facility], [place]. [address], [place], [place] [address]      Studies: No results found.  Scheduled Meds: . dextromethorphan-guaiFENesin  1 tablet Oral BID  . doxycycline  100 mg Oral BID  . enoxaparin (LOVENOX) injection  40 mg Subcutaneous Q24H  . feeding supplement (PRO-STAT SUGAR FREE 64)  30 mL Oral TID BM  . fluticasone  2 spray Each Nare Daily  . insulin aspart  0-15 Units Subcutaneous Q4H  . insulin glargine  5 Units Subcutaneous QHS  . ipratropium-albuterol  3 mL Nebulization TID  .  loratadine  10 mg Oral Daily  . methylPREDNISolone (SOLU-MEDROL) injection  60 mg Intravenous Q8H  . mometasone-formoterol  2 puff Inhalation BID  . multivitamin with minerals  1 tablet Oral Daily  . pantoprazole  40 mg Oral Daily  . simvastatin  40 mg Oral QHS  . traZODone  150 mg Oral QHS    Continuous Infusions:   LOS: 3 days     Blain Pais, MD [facility]  If 7PM-7AM, please contact night-coverage [email or web address] 07/02/2019, 8:43 PM

## 2019-07-02 NOTE — Evaluation (Addendum)
Physical Therapy One Time Evaluation Patient Details Name: James [name] MRN: [id number] DOB: 13-Sep-1955 Today's Date: 07/02/2019   History of Present Illness  James [name] is a 63 y.o. male with medical history significant of Asthma, CAD, T1DM (on insulin pump), HLD, GERD, Depression, and Tobacco abuse who presents with shortness of breath. Patient reports he was in [place] up until about 12 days ago.  He states prior to that (about three weeks from today) and about 10 days prior to onset of symptoms he had resuming smoking tobacco.  He reports he has a prior 40 pack year history, had quit for two years, and without an acute stressor picked up smoking about 2 cigarettes a [place] for the past three weeks.  He states he had been on symbicort but ran out of that 2 months ago.  He states he began having cough, chest tightness, wheezing about 12 days ago.  He denies any fevers or chills and reports that he has not been able to bring up any sputum.  He has an albuterol inhaler which he had been using 8-10 x a [place].  His symptoms had been worsening so he reports doing a telephone visit with Pulmonology, NP [name], who had prescribed him [facility] and Tapered steroids.  He has been using that regimen of 40 mg daily x 3 days and now 30 mg x 2 days but has had worsening symptoms.  He states his inhaler/nebulizer use has gone up to 12 x a [place] in the past three days.    Clinical Impression  Pt admitted with above diagnosis.  Physical therapy evaluation and gait training completed, patient is independent with functional activities and no further PT services recommended at this time. Pt not normally on supplemental home O2, but currently requiring 4LPM O2 for entire evaluation. Pt able to complete transfers and ambulation on 4LPM O2 with O2 sat 86-91%, mainly staying 88-89%. Pt's O2 sat decreased to 86% during ambulation, but able to recover to 89% with pursed lip breathing cues. Pt able to continue conversation throughout  mobility, demonstrating no to mild SOB. Educated pt on pursed lip breathing with activity to improve O2 sat. Did not trial pt on room air due to O2 saturation with 4LPM O2 hovering 88-89% during evaluation. Pt discharged to care of nursing and family for ambulation daily as tolerated for length of stay.       Follow Up Recommendations No PT follow up    Equipment Recommendations  Other (comment)(Supplemental O2)    Recommendations for Other Services       Precautions / Restrictions Precautions Precaution Comments: monitor O2 sat Restrictions Weight Bearing Restrictions: No      Mobility  Bed Mobility Overal bed mobility: Independent      General bed mobility comments: use of bedrail to upright trunk and come to seated at EOB  Transfers Overall transfer level: Independent Equipment used: None      General transfer comment: no unsteadiness noted, no SOB upon standing  Ambulation/Gait Ambulation/Gait assistance: Independent Gait Distance (Feet): 100 Feet Assistive device: None Gait Pattern/deviations: WFL(Within Functional Limits) Gait velocity: slightly decreased   General Gait Details: on 4LPM O2, O2 sat 88-89% with occasional decrease to 86%, able to rebound to 89% with pursed lip breathing cues while ambulating; after ambulation O2 sat increased to 91% with seated pursed lip breathing  Stairs     Wheelchair Mobility    Modified Rankin (Stroke Patients Only)       Balance  Overall balance assessment: Independent            Pertinent Vitals/Pain Pain Assessment: No/denies pain    Home Living Family/patient expects to be discharged to:: Private residence Living Arrangements: Spouse/significant other(Spouse works Monday-Friday 8-5 at [employer]) Available Help at Discharge: Family;Available PRN/intermittently   Home Access: Stairs to enter Entrance Stairs-Rails: None Entrance Stairs-Number of Steps: 2 Home Layout: One level Home Equipment: None       Prior Function Level of Independence: Independent         Comments: Pt reports he is independent with ADLs, community ambulation without AD, drives, 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 Pt denies home O2 use.     Hand Dominance   Dominant Hand: Right    Extremity/Trunk Assessment   Upper Extremity Assessment Upper Extremity Assessment: Overall WFL for tasks assessed    Lower Extremity Assessment Lower Extremity Assessment: Overall WFL for tasks assessed    Cervical / Trunk Assessment Cervical / Trunk Assessment: Normal  Communication   Communication: No difficulties  Cognition Arousal/Alertness: Awake/alert Behavior During Therapy: WFL for tasks assessed/performed Overall Cognitive Status: Within Functional Limits for tasks assessed             General Comments      Exercises     Assessment/Plan    PT Assessment Patent does not need any further PT services  PT Problem List         PT Treatment Interventions      PT Goals (Current goals can be found in the Care Plan section)  Acute Rehab PT Goals Patient Stated Goal: go home PT Goal Formulation: With patient Time For Goal Achievement: 07/02/19 Potential to Achieve Goals: Good    Frequency     Barriers to discharge        Co-evaluation        AM-PAC PT "6 Clicks" Mobility  Outcome Measure Help needed turning from your back to your side while in a flat bed without using bedrails?: None Help needed moving from lying on your back to sitting on the side of a flat bed without using bedrails?: None Help needed moving to and from a bed to a chair (including a wheelchair)?: None Help needed standing up from a chair using your arms (e.g., wheelchair or bedside chair)?: None Help needed to walk in hospital room?: None Help needed climbing 3-5 steps with a railing? : None 6 Click Score: 24    End of Session Equipment Utilized During Treatment: Oxygen(4LPM  O2) Activity Tolerance: Patient tolerated treatment well Patient left: in bed;with call bell/phone within reach;with nursing/sitter in room(seated EOB) Nurse Communication: Mobility status;Other (comment)(O2 sat with ambulation) PT Visit Diagnosis: Other abnormalities of gait and mobility (R26.89)    Time: [id number] PT Time Calculation (min) (ACUTE ONLY): 27 min   Charges:   PT Evaluation $PT Eval Moderate Complexity: 1 Mod PT Treatments $Therapeutic Activity: 8-22 mins         Tori Adam Demary PT, DPT 07/02/19, 12:55 PM [phone]

## 2019-07-02 NOTE — Progress Notes (Addendum)
Inpatient Diabetes Program Recommendations  AACE/ADA: New Consensus Statement on Inpatient Glycemic Control (2015)  Target Ranges:  Prepandial:   less than 140 mg/dL      Peak postprandial:   less than 180 mg/dL (1-2 hours)      Critically ill patients:  140 - 180 mg/dL   Lab Results  Component Value Date   GLUCAP 141 (H) 07/02/2019   HGBA1C 6.5 (H) 04/17/2019    Review of Glycemic Control  Diabetes history: DM 1 Outpatient Diabetes medications: insulin pump Current orders for Inpatient glycemic control:  Lantus 5 units Novolog 0-15 units Q4 hours  Inpatient Diabetes Program Recommendations:    Noted by MD note last night insulin pump not functioning properly and "broke".   Patient may benefit from Novolog 4-5 units meal coverage tid with parameters in addition to correction scale.  Patients with Type 1 DM are very sensitive to insulin. Pt may need to have Novolog Correction adjusted to sensitive 0-9 units Q4 hours.  (eating at least 50% of meals and glucose is at least 80 mg/dl.)  Thanks,  Tama Headings RN, MSN, BC-ADM Inpatient Diabetes Coordinator Team Pager [phone] (8a-5p)

## 2019-07-02 NOTE — Progress Notes (Signed)
  Progress Note   Date: 07/02/2019  Patient Name: James [name]        MRN#: [id number]   Clarification of diagnosis:   Acute respiratory failure

## 2019-07-02 NOTE — Telephone Encounter (Signed)
Pt. In hospital. Appt. For today cancelled-please do not charge for office visit.

## 2019-07-03 ENCOUNTER — Inpatient Hospital Stay (HOSPITAL_COMMUNITY): Payer: 59

## 2019-07-03 LAB — SARS CORONAVIRUS 2 (TAT 6-24 HRS): SARS Coronavirus 2: NEGATIVE

## 2019-07-03 LAB — GLUCOSE, CAPILLARY
Glucose-Capillary: 154 mg/dL — ABNORMAL HIGH (ref 70–99)
Glucose-Capillary: 171 mg/dL — ABNORMAL HIGH (ref 70–99)
Glucose-Capillary: 193 mg/dL — ABNORMAL HIGH (ref 70–99)
Glucose-Capillary: 348 mg/dL — ABNORMAL HIGH (ref 70–99)
Glucose-Capillary: 204 mg/dL — ABNORMAL HIGH (ref 70–99)
Glucose-Capillary: 222 mg/dL — ABNORMAL HIGH (ref 70–99)

## 2019-07-03 LAB — SAR COV2 SEROLOGY (COVID19)AB(IGG),IA: SARS-CoV-2 Ab, IgG: NONREACTIVE

## 2019-07-03 MED ORDER — SODIUM CHLORIDE (PF) 0.9 % IJ SOLN
INTRAMUSCULAR | Status: AC
  Filled 2019-07-03: qty 50

## 2019-07-03 MED ORDER — IPRATROPIUM-ALBUTEROL 0.5-2.5 (3) MG/3ML IN SOLN
3.0000 mL | Freq: Four times a day (QID) | RESPIRATORY_TRACT | Status: DC
  Administered 2019-07-04 – 2019-07-08 (×18): 3 mL via RESPIRATORY_TRACT
  Filled 2019-07-03 (×17): qty 3

## 2019-07-03 MED ORDER — INSULIN ASPART 100 UNIT/ML ~~LOC~~ SOLN
0.0000 [IU] | Freq: Every day | SUBCUTANEOUS | Status: DC
  Administered 2019-07-03: 2 [IU] via SUBCUTANEOUS

## 2019-07-03 MED ORDER — INSULIN ASPART 100 UNIT/ML ~~LOC~~ SOLN
3.0000 [IU] | Freq: Three times a day (TID) | SUBCUTANEOUS | Status: DC
  Administered 2019-07-03 – 2019-07-04 (×3): 3 [IU] via SUBCUTANEOUS

## 2019-07-03 MED ORDER — INSULIN ASPART 100 UNIT/ML ~~LOC~~ SOLN: 3.0000 [IU] | Freq: Three times a day (TID) | SUBCUTANEOUS | Status: DC

## 2019-07-03 MED ORDER — INSULIN ASPART 100 UNIT/ML ~~LOC~~ SOLN: 0.0000 [IU] | Freq: Three times a day (TID) | SUBCUTANEOUS | Status: DC

## 2019-07-03 MED ORDER — LEVOFLOXACIN 750 MG PO TABS
750.0000 mg | ORAL_TABLET | Freq: Every day | ORAL | Status: DC
  Administered 2019-07-03 – 2019-07-05 (×3): 750 mg via ORAL
  Filled 2019-07-03 (×3): qty 1

## 2019-07-03 MED ORDER — INSULIN ASPART 100 UNIT/ML ~~LOC~~ SOLN
0.0000 [IU] | Freq: Three times a day (TID) | SUBCUTANEOUS | Status: DC
  Administered 2019-07-03: 7 [IU] via SUBCUTANEOUS
  Administered 2019-07-03: 3 [IU] via SUBCUTANEOUS
  Administered 2019-07-04: 5 [IU] via SUBCUTANEOUS

## 2019-07-03 MED ORDER — IOHEXOL 350 MG/ML SOLN
100.0000 mL | Freq: Once | INTRAVENOUS | Status: AC | PRN
  Administered 2019-07-03: 09:00:00 100 mL via INTRAVENOUS

## 2019-07-03 NOTE — Progress Notes (Signed)
PROGRESS NOTE  James [name] James [name] DOB: 1955-10-01 DOA: 06/28/2019 PCP: [name], MD  HPI/Recap of past 24 hours: HPI from Dr [name] R James [name] is a 63 y.o. male with medical history significant of Asthma, CAD, T1DM (on insulin pump), HLD, GERD, Depression, and Tobacco abuse who presents with worsening shortness of breath, cough, chest tightness/wheezing for about 2 weeks. Pt initially quit smoking tobacco for 2 years, (was on Chantix), but recently started smoking about 3 weeks ago.  Due to worsening symptoms, patient had a telephone visit with Pulmonology, NP [name], who had prescribed him [facility] and Tapered steroids, without any significant improvement. Patient had PFTs done in March, mild restriction, normal ratios. Pt admitted for further management.  On the second [place] after his admission, patient developed significant respiratory distress with coughing spell/wheezing and subsequently went into panic attack.  Patient improved after nebulizer treatment and bumping up oxygen to 6 L.     Subjective:  Patient with CBG trending up (insulin pump stopped working on evening of 12/13). O2 requirement is improving.   Assessment/Plan: Active Problems:   Asthma exacerbation   Acute asthma exacerbation  Acute respiratory failure with hypoxia Asthma exacerbation  Currently afebrile, with no leukocytosis Requiring up to 6 L of supplemental oxygen to maintain sats above 90% but being weaned off. He does not use supplementation O2 at baseline.  Inflammatory markers unremarkable, procalcitonin negative D-dimer mildly elevated at 0.62. CTA chest ordered with no PE, showing bronchitis v pna.  Covid test/respiratory viral panel negative Chest x-ray unremarkable PFTs done in March showed mild restriction, with normal ratios Continue IV Solu-Medrol, doxycycline, cough suppressant, duo nebs Continue supplemental oxygen Started low dose xanax po as needed for anxiety and for  shortness of breath.  Start IS, chest percussion with RT consultation.  -Rechecking COVID due to small focus of ground glass opacity on CTA,  -Changed Abx to Levaquin from doxycycline.  -Ambulate patient and monitor O2 sats, unclear if he will need home O2 supplementation.    Type 1 diabetes mellitus A1c 6.5 on 03/2019 Home insulin pump malfunctioned last night.  Continue SSI and Lantus. May need to change SSI, start meal coverage insulin but will monitor CBG for now.  Diabetes coordinator on board  GERD Continue PPI  Hyperlipidemia Continue Zocor   Malnutrition Type:  Nutrition Problem: Increased nutrient needs Etiology: acute illness   Malnutrition Characteristics:  Signs/Symptoms: estimated needs   Nutrition Interventions:  Interventions: Prostat, MVI    Estimated body mass index is 26.24 kg/m as calculated from the following:   Height as of this encounter: 5\' 9"  (1.753 m).   Weight as of this encounter: 80.6 kg.     Code Status: Full  Family Communication: None at bedside  Disposition Plan: Likely home   Consultants:  None  Procedures:  None  Antimicrobials:  Doxycycline 12/10>>12/15  Levaquin 12/15>>  DVT prophylaxis: Lovenox   Objective: Vitals:   07/02/19 1903 07/02/19 2001 07/03/19 0543 07/03/19 0707  BP:  118/64 127/79   Pulse:  78 71   Resp:  18 18   Temp:  97.7 F (36.5 C) 98.1 F (36.7 C)   TempSrc:  Oral Oral   SpO2: 95% 91% 94% 94%  Weight:      Height:        Intake/Output Summary (Last 24 hours) at 07/03/2019 1123 Last data filed at 07/03/2019 0733 Gross per 24 hour  Intake 615 ml  Output 1050 ml  Net -435 ml  Filed Weights   06/28/19 1008 07/01/19 0556 07/02/19 0532  Weight: 77.1 kg 80.3 kg 80.6 kg    Exam:  General: NAD   Cardiovascular: S1, S2 present  Respiratory:  Diminished breath sounds bilaterally  Abdomen: Soft, nontender, nondistended, bowel sounds present  Musculoskeletal: No bilateral  pedal edema noted  Skin: Normal  Psychiatry: Normal mood   Data Reviewed: CBC: Recent Labs  Lab 06/28/19 1030 06/28/19 1934 06/29/19 0514 06/30/19 0619  WBC 8.8 5.7 7.1 9.9  NEUTROABS 8.3*  --   --  9.2*  HGB 15.1 14.6 14.7 14.6  HCT 44.0 43.1 43.5 42.9  MCV 93.2 94.9 94.0 94.5  PLT 253 272 254 [id number]   Basic Metabolic Panel: Recent Labs  Lab 06/28/19 1030 06/28/19 1934 06/29/19 0514 06/30/19 0619  NA 137  --  136 136  K 3.7  --  4.1 4.9  CL 104  --  104 104  CO2 24  --  20* 24  GLUCOSE 198*  --  221* 269*  BUN 22  --  19 33*  CREATININE 0.81 0.85 0.73 0.90  CALCIUM 8.8*  --  8.5* 8.7*   GFR: Estimated Creatinine Clearance: 82.9 mL/min (by C-G formula based on SCr of 0.9 mg/dL). Liver Function Tests: No results for input(s): AST, ALT, ALKPHOS, BILITOT, PROT, ALBUMIN in the last 168 hours. No results for input(s): LIPASE, AMYLASE in the last 168 hours. No results for input(s): AMMONIA in the last 168 hours. Coagulation Profile: No results for input(s): INR, PROTIME in the last 168 hours. Cardiac Enzymes: No results for input(s): CKTOTAL, CKMB, CKMBINDEX, TROPONINI in the last 168 hours. BNP (last 3 results) No results for input(s): PROBNP in the last 8760 hours. HbA1C: No results for input(s): HGBA1C in the last 72 hours. CBG: Recent Labs  Lab 07/02/19 1621 07/02/19 1958 07/02/19 2350 07/03/19 0346 07/03/19 0750  GLUCAP 236* 192* 154* 171* 193*   Lipid Profile: No results for input(s): CHOL, HDL, LDLCALC, TRIG, CHOLHDL, LDLDIRECT in the last 72 hours. Thyroid Function Tests: No results for input(s): TSH, T4TOTAL, FREET4, T3FREE, THYROIDAB in the last 72 hours. Anemia Panel: No results for input(s): VITAMINB12, FOLATE, FERRITIN, TIBC, IRON, RETICCTPCT in the last 72 hours. Urine analysis:    Component Value Date/Time   COLORURINE YELLOW 04/10/2017 [place] 04/10/2017 0743   LABSPEC 1.015 04/10/2017 0743   PHURINE 6.0 04/10/2017  0743   GLUCOSEU >=500 (A) 04/10/2017 0743   GLUCOSEU 500 (?) 05/08/2010 [place] 04/10/2017 0743   BILIRUBINUR NEGATIVE 04/10/2017 0743   KETONESUR NEGATIVE 04/10/2017 0743   PROTEINUR NEGATIVE 04/10/2017 0743   UROBILINOGEN 0.2 04/11/2013 0008   NITRITE NEGATIVE 04/10/2017 0743   LEUKOCYTESUR NEGATIVE 04/10/2017 0743   Sepsis Labs: @LABRCNTIP (procalcitonin:4,lacticidven:4)  ) Recent Results (from the past 240 hour(s))  Novel Coronavirus, NAA ([facility])     Status: None   Collection Time: 06/27/19  1:34 PM   Specimen: Nasopharyngeal(NP) swabs in vial transport medium   NASOPHARYNGE  TESTING  Result Value Ref Range Status   SARS-CoV-2, NAA Not Detected Not Detected Final    Comment: This nucleic acid amplification test was developed and its performance characteristics determined by [employer]. Nucleic acid amplification tests include PCR and TMA. This test has not been FDA cleared or approved. This test has been authorized by FDA under an Emergency Use Authorization (EUA). This test is only authorized for the duration of time the declaration that circumstances exist justifying the authorization of  the emergency use of in vitro diagnostic tests for detection of SARS-CoV-2 virus and/or diagnosis of COVID-19 infection under section 564(b)(1) of the Act, 21 U.S.C. [id number]) (1), unless the authorization is terminated or revoked sooner. When diagnostic testing is negative, the possibility of a false negative result should be considered in the context of a patient's recent exposures and the presence of clinical signs and symptoms consistent with COVID-19. An individual without symptoms of COVID-19 and who is not shedding SARS-CoV-2 virus would  expect to have a negative (not detected) result in this assay.   SARS CORONAVIRUS 2 (TAT 6-24 HRS) Nasopharyngeal Nasopharyngeal Swab     Status: None   Collection Time: 06/28/19 12:20 PM   Specimen: Nasopharyngeal  Swab  Result Value Ref Range Status   SARS Coronavirus 2 NEGATIVE NEGATIVE Final    Comment: (NOTE) SARS-CoV-2 target nucleic acids are NOT DETECTED. The SARS-CoV-2 RNA is generally detectable in upper and lower respiratory specimens during the acute phase of infection. Negative results do not preclude SARS-CoV-2 infection, do not rule out co-infections with other pathogens, and should not be used as the sole basis for treatment or other patient management decisions. Negative results must be combined with clinical observations, patient history, and epidemiological information. The expected result is Negative. Fact Sheet for Patients: SugarRoll.be Fact Sheet for Healthcare Providers: https://www.woods-mathews.com/ This test is not yet approved or cleared by the [place] FDA and  has been authorized for detection and/or diagnosis of SARS-CoV-2 by FDA under an Emergency Use Authorization (EUA). This EUA will remain  in effect (meaning this test can be used) for the duration of the COVID-19 declaration under Section 56 4(b)(1) of the Act, 21 U.S.C. section 360bbb-3(b)(1), unless the authorization is terminated or revoked sooner. Performed at [facility], [place] [address] [place], [place] [address]   Respiratory Panel by PCR     Status: None   Collection Time: 06/28/19  6:47 PM   Specimen: Nasopharyngeal Swab; Respiratory  Result Value Ref Range Status   Adenovirus NOT DETECTED NOT DETECTED Final   Coronavirus 229E NOT DETECTED NOT DETECTED Final    Comment: (NOTE) The Coronavirus on the Respiratory Panel, DOES NOT test for the novel  Coronavirus (2019 nCoV)    Coronavirus HKU1 NOT DETECTED NOT DETECTED Final   Coronavirus NL63 NOT DETECTED NOT DETECTED Final   Coronavirus OC43 NOT DETECTED NOT DETECTED Final   Metapneumovirus NOT DETECTED NOT DETECTED Final   Rhinovirus / Enterovirus NOT DETECTED NOT DETECTED Final   Influenza A  NOT DETECTED NOT DETECTED Final   Influenza B NOT DETECTED NOT DETECTED Final   Parainfluenza Virus 1 NOT DETECTED NOT DETECTED Final   Parainfluenza Virus 2 NOT DETECTED NOT DETECTED Final   Parainfluenza Virus 3 NOT DETECTED NOT DETECTED Final   Parainfluenza Virus 4 NOT DETECTED NOT DETECTED Final   Respiratory Syncytial Virus NOT DETECTED NOT DETECTED Final   Bordetella pertussis NOT DETECTED NOT DETECTED Final   Chlamydophila pneumoniae NOT DETECTED NOT DETECTED Final   Mycoplasma pneumoniae NOT DETECTED NOT DETECTED Final    Comment: Performed at [facility], [place]. [address], [place], [place] [address]      Studies: CT ANGIO CHEST PE W OR WO CONTRAST  Result Date: 07/03/2019 CLINICAL DATA:  Shortness of breath EXAM: CT ANGIOGRAPHY CHEST WITH CONTRAST TECHNIQUE: Multidetector CT imaging of the chest was performed using the standard protocol during bolus administration of intravenous contrast. Multiplanar CT image reconstructions and MIPs were obtained  to evaluate the vascular anatomy. CONTRAST:  123mL OMNIPAQUE IOHEXOL 350 MG/ML SOLN COMPARISON:  CT angiogram chest May 16, 2019; chest radiograph June 28, 2019 FINDINGS: Cardiovascular: There is no demonstrable pulmonary embolus. There is no thoracic aortic aneurysm or dissection. There are occasional foci of calcification in the proximal major great vessels. Note that the right innominate and left common carotid arteries arise as a common trunk, an anatomic variant. There are foci of aortic atherosclerosis as well as foci of coronary artery calcification. There is no pericardial effusion or pericardial thickening. Mediastinum/Nodes: Thyroid appears unremarkable. There are scattered foci of lymph node calcification consistent with prior granulomatous disease. No adenopathy is evident. No esophageal lesions are appreciable. Flattening of the posterior mid and lower trachea is again noted, unchanged in appearance and suspicious for a  degree of tracheomalacia. Lungs/Pleura: There is central peribronchial thickening. There are scattered areas of atelectatic change in both lower lobes. There may be a degree of superimposed airspace consolidation in these areas. There is a calcified granuloma in the superior segment left lower lobe, stable. On axial slice 33 series 6, there is a new area of focal ground-glass type opacity in the anterior segment of the left upper lobe measuring 1.2 x 0.8 cm. No pleural effusions are evident. Upper Abdomen: 2-3 mm calculi are noted in the upper pole of the right kidney. There are calcified splenic granulomas. Visualized upper abdominal structures otherwise appear unremarkable. Musculoskeletal: There is postoperative change in the lower cervical region. There is degenerative change in the thoracic spine. There are no blastic or lytic bone lesions. No chest wall lesions evident. Review of the MIP images confirms the above findings. IMPRESSION: 1. No demonstrable pulmonary embolus. No thoracic aortic aneurysm or dissection. There are foci aortic atherosclerosis as well as great vessel and coronary artery calcifications. 2. Central bronchitis bilaterally. Areas of atelectatic change in each lower lobe with suspected superimposed foci of pneumonia in these areas. No pleural effusions. 3. Focal new area of ground-glass type opacity in the anterior segment of the left upper lobe. This area measures 1.2 x 0.8 cm. Initial follow-up with CT at 6-12 months is recommended to confirm persistence. If persistent, repeat CT is recommended every 2 years until 5 years of stability has been established. This recommendation follows the consensus statement: Guidelines for Management of Incidental Pulmonary Nodules Detected on CT Images: From the Fleischner Society 2017; Radiology 2017; 284:228-243. 4.  No appreciable adenopathy. 5. 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 6.  Nonobstructing calculi in upper pole  right kidney. Aortic Atherosclerosis (ICD10-I70.0). Electronically Signed   By: Lowella Grip III M.D.   On: 07/03/2019 09:30    Scheduled Meds: . dextromethorphan-guaiFENesin  1 tablet Oral BID  . doxycycline  100 mg Oral BID  . enoxaparin (LOVENOX) injection  40 mg Subcutaneous Q24H  . feeding supplement (PRO-STAT SUGAR FREE 64)  30 mL Oral TID BM  . fluticasone  2 spray Each Nare Daily  . insulin aspart  0-15 Units Subcutaneous Q4H  . insulin glargine  5 Units Subcutaneous QHS  . ipratropium-albuterol  3 mL Nebulization TID  . loratadine  10 mg Oral Daily  . methylPREDNISolone (SOLU-MEDROL) injection  60 mg Intravenous Q8H  . mometasone-formoterol  2 puff Inhalation BID  . multivitamin with minerals  1 tablet Oral Daily  . pantoprazole  40 mg Oral Daily  . simvastatin  40 mg Oral QHS  . sodium chloride (PF)      .  traZODone  150 mg Oral QHS    Continuous Infusions:   LOS: 4 days     Blain Pais, MD [facility]  If 7PM-7AM, please contact night-coverage [email or web address] 07/03/2019, 11:23 AM

## 2019-07-03 NOTE — Progress Notes (Signed)
SATURATION QUALIFICATIONS: (This note is used to comply with regulatory documentation for home oxygen)  Patient Saturations on Room Air at Rest = 93%  Patient Saturations on Room Air while Ambulating = 91%  Patient Saturations on 3 Liters of oxygen while Ambulating = 96%  Please briefly explain why patient needs home oxygen: Patient with history of asthma and tobacco abuse.  Admitted with significant respiratory distress.

## 2019-07-04 DIAGNOSIS — J441 Chronic obstructive pulmonary disease with (acute) exacerbation: Secondary | ICD-10-CM

## 2019-07-04 LAB — GLUCOSE, CAPILLARY
Glucose-Capillary: 343 mg/dL — ABNORMAL HIGH (ref 70–99)
Glucose-Capillary: 546 mg/dL (ref 70–99)
Glucose-Capillary: 408 mg/dL — ABNORMAL HIGH (ref 70–99)
Glucose-Capillary: 351 mg/dL — ABNORMAL HIGH (ref 70–99)
Glucose-Capillary: 211 mg/dL — ABNORMAL HIGH (ref 70–99)
Glucose-Capillary: 134 mg/dL — ABNORMAL HIGH (ref 70–99)
Glucose-Capillary: 60 mg/dL — ABNORMAL LOW (ref 70–99)
Glucose-Capillary: 61 mg/dL — ABNORMAL LOW (ref 70–99)
Glucose-Capillary: 107 mg/dL — ABNORMAL HIGH (ref 70–99)
Glucose-Capillary: 62 mg/dL — ABNORMAL LOW (ref 70–99)
Glucose-Capillary: 126 mg/dL — ABNORMAL HIGH (ref 70–99)
Glucose-Capillary: 163 mg/dL — ABNORMAL HIGH (ref 70–99)
Glucose-Capillary: 160 mg/dL — ABNORMAL HIGH (ref 70–99)
Glucose-Capillary: 157 mg/dL — ABNORMAL HIGH (ref 70–99)

## 2019-07-04 LAB — CBC
HCT: 40.9 % (ref 39.0–52.0)
Hemoglobin: 14.5 g/dL (ref 13.0–17.0)
MCH: 31.9 pg (ref 26.0–34.0)
MCHC: 35.5 g/dL (ref 30.0–36.0)
MCV: 90.1 fL (ref 80.0–100.0)
Platelets: 209 10*3/uL (ref 150–400)
RBC: 4.54 MIL/uL (ref 4.22–5.81)
RDW: 11.6 % (ref 11.5–15.5)
WBC: 6.9 10*3/uL (ref 4.0–10.5)
nRBC: 0 % (ref 0.0–0.2)

## 2019-07-04 LAB — BASIC METABOLIC PANEL
Anion gap: 8 (ref 5–15)
BUN: 25 mg/dL — ABNORMAL HIGH (ref 8–23)
CO2: 24 mmol/L (ref 22–32)
Calcium: 8.3 mg/dL — ABNORMAL LOW (ref 8.9–10.3)
Chloride: 100 mmol/L (ref 98–111)
Creatinine, Ser: 0.84 mg/dL (ref 0.61–1.24)
GFR calc Af Amer: >60 mL/min (ref >60)
GFR calc non Af Amer: >60 mL/min (ref >60)
Glucose, Bld: 320 mg/dL — ABNORMAL HIGH (ref 70–99)
Potassium: 4.7 mmol/L (ref 3.5–5.1)
Sodium: 132 mmol/L — ABNORMAL LOW (ref 135–145)

## 2019-07-04 LAB — MRSA PCR SCREENING: MRSA by PCR: NEGATIVE

## 2019-07-04 LAB — GLUCOSE, RANDOM: Glucose, Bld: 542 mg/dL (ref 70–99)

## 2019-07-04 MED ORDER — CHLORHEXIDINE GLUCONATE CLOTH 2 % EX PADS
6.0000 | MEDICATED_PAD | Freq: Every day | CUTANEOUS | Status: DC
  Administered 2019-07-04 – 2019-07-07 (×4): 6 via TOPICAL

## 2019-07-04 MED ORDER — INSULIN REGULAR(HUMAN) IN NACL 100-0.9 UT/100ML-% IV SOLN
INTRAVENOUS | Status: DC
  Administered 2019-07-04: 15 [IU]/h via INTRAVENOUS
  Filled 2019-07-04 (×2): qty 100

## 2019-07-04 MED ORDER — DEXTROSE-NACL 5-0.45 % IV SOLN: INTRAVENOUS | Status: DC

## 2019-07-04 MED ORDER — INSULIN ASPART 100 UNIT/ML ~~LOC~~ SOLN
9.0000 [IU] | Freq: Once | SUBCUTANEOUS | Status: AC
  Administered 2019-07-04: 9 [IU] via SUBCUTANEOUS

## 2019-07-04 MED ORDER — SODIUM CHLORIDE 0.9 % IV SOLN: INTRAVENOUS | Status: DC

## 2019-07-04 MED ORDER — DEXTROSE 50 % IV SOLN: 0.0000 mL | INTRAVENOUS | Status: DC | PRN

## 2019-07-04 NOTE — Progress Notes (Signed)
CBG 546. MD notified. Orders for stat glucose placed

## 2019-07-04 NOTE — Progress Notes (Signed)
PROGRESS NOTE    James [name]  [id number] DOB: 09/24/1955 DOA: 06/28/2019 PCP: [name], MD    Brief Narrative:  63 y.o.malewith medical history significant ofAsthma, CAD, T1DM (on insulin pump), HLD, GERD, Depression, and Tobacco abuse who presents with worsening shortness of breath, cough, chest tightness/wheezing for about 2 weeks. Pt initially quit smoking tobacco for 2 years, (was on Chantix), but recently started smoking about 3 weeks ago.  Due to worsening symptoms, patient had a telephone visit with Pulmonology, NP [name], who had prescribed him [facility] and [facility] steroids, without any significant improvement. Patient had PFTs done in March, mild restriction, normal ratios. Pt admitted for further management.  Assessment & Plan:   Active Problems:   Asthma exacerbation   Acute asthma exacerbation   Acute respiratory failure with hypoxia Asthma exacerbation  -Initially required up to 6 L of supplemental oxygen to maintain sats above 90%, now weaned to [facility] -Inflammatory markers unremarkable, procalcitonin negative -D-dimer mildly elevated at 0.62. CTA chest ordered with no PE, showing bronchitis v pna.  -Covid test/respiratory viral panel negative -Chest x-ray unremarkable -PFTs done in March showed mild restriction, with normal ratios -Continue IV Solu-Medrol, doxycycline, cough suppressant, duo nebs -Repeat COVID test is neg -Currently on PO levaquin -Remains visibly sob with wheezing in all fields, thus will continue current solumedrol despite labile glucose, see below   Type 1 diabetes mellitus -A1c 6.5 on 03/2019 -Home insulin pump reportedly malfunctioned, not present currently -Had initially continued SSI and Lantus however glucose noted to be very difficult to control with glucose to just under 550 -Discussed with diabetic coordinator. Will plan to cont on insulin gtt overnight to help establish pt's insulin requirement prior to transitioning  back to subq insulin  GERD -Continue PPI as tolerated  Hyperlipidemia -Continue Zocor as tolerated  Malnutrition Type:  Nutrition Problem: Increased nutrient needs Etiology: acute illness   Malnutrition Characteristics:  Signs/Symptoms: estimated needs   Nutrition Interventions:  Interventions: Prostat, MVI    Estimated body mass index is 26.24 kg/m as calculated from the following:   Height as of this encounter: 5\' 9"  (1.753 m).   Weight as of this encounter: 80.6 kg.     DVT prophylaxis: Lovenox subQ Code Status: Full Family Communication: Pt in room, family not at bedside Disposition Plan: Uncertain at this time  Consultants:     Procedures:     Antimicrobials: Anti-infectives (From admission, onward)   Start     Dose/Rate Route Frequency Ordered Stop   07/03/19 1230  levofloxacin (LEVAQUIN) tablet 750 mg     750 mg Oral Daily 07/03/19 1220     06/28/19 2200  doxycycline (VIBRA-TABS) tablet 100 mg  Status:  Discontinued     100 mg Oral 2 times daily 06/28/19 1852 07/03/19 1216   06/28/19 1400  doxycycline (VIBRAMYCIN) 100 mg in sodium chloride 0.9 % 250 mL IVPB     100 mg 125 mL/hr over 120 Minutes Intravenous  Once 06/28/19 1332 06/28/19 1627       Subjective: Still sob and coughing this AM  Objective: Vitals:   07/04/19 1500 07/04/19 1510 07/04/19 1600 07/04/19 1700  BP: (!) 152/80  (!) 151/72 (!) 142/70  Pulse: 90  80 61  Resp: (!) 23  17 (!) 24  Temp:  98.1 F (36.7 C)    TempSrc:  Oral    SpO2: 96%  96% 94%  Weight:      Height:        Intake/Output  Summary (Last 24 hours) at 07/04/2019 1810 Last data filed at 07/04/2019 1800 Gross per 24 hour  Intake 1063.18 ml  Output 1200 ml  Net -136.82 ml   Filed Weights   06/28/19 1008 07/01/19 0556 07/02/19 0532  Weight: 77.1 kg 80.3 kg 80.6 kg    Examination:  General exam: Appears calm and alert Respiratory system: Decreased BS, wheezing, increasd resp effort. Pt  coughing Cardiovascular system: S1 & S2 heard, Regular Gastrointestinal system: Abdomen is nondistended, soft and nontender. No organomegaly or masses felt. Normal bowel sounds heard. Central nervous system: no tremors. No focal neurological deficits. Extremities: Symmetric 5 x 5 power. Skin: No rashes, lesions  Psychiatry: Judgement and insight appear normal. Mood & affect appropriate.   Data Reviewed: I have personally reviewed following labs and imaging studies  CBC: Recent Labs  Lab 06/28/19 1030 06/28/19 1934 06/29/19 0514 06/30/19 0619 07/04/19 0516  WBC 8.8 5.7 7.1 9.9 6.9  NEUTROABS 8.3*  --   --  9.2*  --   HGB 15.1 14.6 14.7 14.6 14.5  HCT 44.0 43.1 43.5 42.9 40.9  MCV 93.2 94.9 94.0 94.5 90.1  PLT 253 272 254 242 [id number]   Basic Metabolic Panel: Recent Labs  Lab 06/28/19 1030 06/28/19 1934 06/29/19 0514 06/30/19 0619 07/04/19 0516 07/04/19 1112  NA 137  --  136 136 132*  --   K 3.7  --  4.1 4.9 4.7  --   CL 104  --  104 104 100  --   CO2 24  --  20* 24 24  --   GLUCOSE 198*  --  221* 269* 320* 542*  BUN 22  --  19 33* 25*  --   CREATININE 0.81 0.85 0.73 0.90 0.84  --   CALCIUM 8.8*  --  8.5* 8.7* 8.3*  --    GFR: Estimated Creatinine Clearance: 88.8 mL/min (by C-G formula based on SCr of 0.84 mg/dL). Liver Function Tests: No results for input(s): AST, ALT, ALKPHOS, BILITOT, PROT, ALBUMIN in the last 168 hours. No results for input(s): LIPASE, AMYLASE in the last 168 hours. No results for input(s): AMMONIA in the last 168 hours. Coagulation Profile: No results for input(s): INR, PROTIME in the last 168 hours. Cardiac Enzymes: No results for input(s): CKTOTAL, CKMB, CKMBINDEX, TROPONINI in the last 168 hours. BNP (last 3 results) No results for input(s): PROBNP in the last 8760 hours. HbA1C: No results for input(s): HGBA1C in the last 72 hours. CBG: Recent Labs  Lab 07/04/19 1620 07/04/19 1622 07/04/19 1639 07/04/19 1701 07/04/19 1804  GLUCAP 61*  60* 62* 107* 126*   Lipid Profile: No results for input(s): CHOL, HDL, LDLCALC, TRIG, CHOLHDL, LDLDIRECT in the last 72 hours. Thyroid Function Tests: No results for input(s): TSH, T4TOTAL, FREET4, T3FREE, THYROIDAB in the last 72 hours. Anemia Panel: No results for input(s): VITAMINB12, FOLATE, FERRITIN, TIBC, IRON, RETICCTPCT in the last 72 hours. Sepsis Labs: Recent Labs  Lab 06/29/19 0938  PROCALCITON <0.10    Recent Results (from the past 240 hour(s))  Novel Coronavirus, NAA ([facility])     Status: None   Collection Time: 06/27/19  1:34 PM   Specimen: Nasopharyngeal(NP) swabs in vial transport medium   NASOPHARYNGE  TESTING  Result Value Ref Range Status   SARS-CoV-2, NAA Not Detected Not Detected Final    Comment: This nucleic acid amplification test was developed and its performance characteristics determined by [employer]. Nucleic acid amplification tests include PCR and TMA. This test has not  been FDA cleared or approved. This test has been authorized by FDA under an Emergency Use Authorization (EUA). This test is only authorized for the duration of time the declaration that circumstances exist justifying the authorization of the emergency use of in vitro diagnostic tests for detection of SARS-CoV-2 virus and/or diagnosis of COVID-19 infection under section 564(b)(1) of the Act, 21 U.S.C. [id number]) (1), unless the authorization is terminated or revoked sooner. When diagnostic testing is negative, the possibility of a false negative result should be considered in the context of a patient's recent exposures and the presence of clinical signs and symptoms consistent with COVID-19. An individual without symptoms of COVID-19 and who is not shedding SARS-CoV-2 virus would  expect to have a negative (not detected) result in this assay.   SARS CORONAVIRUS 2 (TAT 6-24 HRS) Nasopharyngeal Nasopharyngeal Swab     Status: None   Collection Time: 06/28/19 12:20 PM    Specimen: Nasopharyngeal Swab  Result Value Ref Range Status   SARS Coronavirus 2 NEGATIVE NEGATIVE Final    Comment: (NOTE) SARS-CoV-2 target nucleic acids are NOT DETECTED. The SARS-CoV-2 RNA is generally detectable in upper and lower respiratory specimens during the acute phase of infection. Negative results do not preclude SARS-CoV-2 infection, do not rule out co-infections with other pathogens, and should not be used as the sole basis for treatment or other patient management decisions. Negative results must be combined with clinical observations, patient history, and epidemiological information. The expected result is Negative. Fact Sheet for Patients: SugarRoll.be Fact Sheet for Healthcare Providers: https://www.woods-mathews.com/ This test is not yet approved or cleared by the [place] FDA and  has been authorized for detection and/or diagnosis of SARS-CoV-2 by FDA under an Emergency Use Authorization (EUA). This EUA will remain  in effect (meaning this test can be used) for the duration of the COVID-19 declaration under Section 56 4(b)(1) of the Act, 21 U.S.C. section 360bbb-3(b)(1), unless the authorization is terminated or revoked sooner. Performed at [facility], [place] [address], [place], [place] [address]   Respiratory Panel by PCR     Status: None   Collection Time: 06/28/19  6:47 PM   Specimen: Nasopharyngeal Swab; Respiratory  Result Value Ref Range Status   Adenovirus NOT DETECTED NOT DETECTED Final   Coronavirus 229E NOT DETECTED NOT DETECTED Final    Comment: (NOTE) The Coronavirus on the Respiratory Panel, DOES NOT test for the novel  Coronavirus (2019 nCoV)    Coronavirus HKU1 NOT DETECTED NOT DETECTED Final   Coronavirus NL63 NOT DETECTED NOT DETECTED Final   Coronavirus OC43 NOT DETECTED NOT DETECTED Final   Metapneumovirus NOT DETECTED NOT DETECTED Final   Rhinovirus / Enterovirus NOT DETECTED NOT  DETECTED Final   Influenza A NOT DETECTED NOT DETECTED Final   Influenza B NOT DETECTED NOT DETECTED Final   Parainfluenza Virus 1 NOT DETECTED NOT DETECTED Final   Parainfluenza Virus 2 NOT DETECTED NOT DETECTED Final   Parainfluenza Virus 3 NOT DETECTED NOT DETECTED Final   Parainfluenza Virus 4 NOT DETECTED NOT DETECTED Final   Respiratory Syncytial Virus NOT DETECTED NOT DETECTED Final   Bordetella pertussis NOT DETECTED NOT DETECTED Final   Chlamydophila pneumoniae NOT DETECTED NOT DETECTED Final   Mycoplasma pneumoniae NOT DETECTED NOT DETECTED Final    Comment: Performed at [facility], [place] [address], [place], [place] [address]  SARS CORONAVIRUS 2 (TAT 6-24 HRS) Nasopharyngeal Nasopharyngeal Swab     Status: None   Collection Time:  07/03/19  1:28 PM   Specimen: Nasopharyngeal Swab  Result Value Ref Range Status   SARS Coronavirus 2 NEGATIVE NEGATIVE Final    Comment: (NOTE) SARS-CoV-2 target nucleic acids are NOT DETECTED. The SARS-CoV-2 RNA is generally detectable in upper and lower respiratory specimens during the acute phase of infection. Negative results do not preclude SARS-CoV-2 infection, do not rule out co-infections with other pathogens, and should not be used as the sole basis for treatment or other patient management decisions. Negative results must be combined with clinical observations, patient history, and epidemiological information. The expected result is Negative. Fact Sheet for Patients: SugarRoll.be Fact Sheet for Healthcare Providers: https://www.woods-mathews.com/ This test is not yet approved or cleared by the [place] FDA and  has been authorized for detection and/or diagnosis of SARS-CoV-2 by FDA under an Emergency Use Authorization (EUA). This EUA will remain  in effect (meaning this test can be used) for the duration of the COVID-19 declaration under Section 56 4(b)(1) of the Act, 21  U.S.C. section 360bbb-3(b)(1), unless the authorization is terminated or revoked sooner. Performed at [facility], [place] [address], [place], [place] [address]   MRSA PCR Screening     Status: None   Collection Time: 07/04/19  3:56 PM   Specimen: Nasal Mucosa; Nasopharyngeal  Result Value Ref Range Status   MRSA by PCR NEGATIVE NEGATIVE Final    Comment:        The GeneXpert MRSA Assay (FDA approved for NASAL specimens only), is one component of a comprehensive MRSA colonization surveillance program. It is not intended to diagnose MRSA infection nor to guide or monitor treatment for MRSA infections. Performed at [facility], [place] [address] [place], [place] [address]      Radiology Studies: CT ANGIO CHEST PE W OR WO CONTRAST  Result Date: 07/03/2019 CLINICAL DATA:  Shortness of breath EXAM: CT ANGIOGRAPHY CHEST WITH CONTRAST TECHNIQUE: Multidetector CT imaging of the chest was performed using the standard protocol during bolus administration of intravenous contrast. Multiplanar CT image reconstructions and MIPs were obtained to evaluate the vascular anatomy. CONTRAST:  137mL OMNIPAQUE IOHEXOL 350 MG/ML SOLN COMPARISON:  CT angiogram chest May 16, 2019; chest radiograph June 28, 2019 FINDINGS: Cardiovascular: There is no demonstrable pulmonary embolus. There is no thoracic aortic aneurysm or dissection. There are occasional foci of calcification in the proximal major great vessels. Note that the right innominate and left common carotid arteries arise as a common trunk, an anatomic variant. There are foci of aortic atherosclerosis as well as foci of coronary artery calcification. There is no pericardial effusion or pericardial thickening. Mediastinum/Nodes: Thyroid appears unremarkable. There are scattered foci of lymph node calcification consistent with prior granulomatous disease. No adenopathy is evident. No esophageal lesions are appreciable. Flattening  of the posterior mid and lower trachea is again noted, unchanged in appearance and suspicious for a degree of tracheomalacia. Lungs/Pleura: There is central peribronchial thickening. There are scattered areas of atelectatic change in both lower lobes. There may be a degree of superimposed airspace consolidation in these areas. There is a calcified granuloma in the superior segment left lower lobe, stable. On axial slice 33 series 6, there is a new area of focal ground-glass type opacity in the anterior segment of the left upper lobe measuring 1.2 x 0.8 cm. No pleural effusions are evident. Upper Abdomen: 2-3 mm calculi are noted in the upper pole of the right kidney. There are calcified splenic granulomas. Visualized upper abdominal structures otherwise  appear unremarkable. Musculoskeletal: There is postoperative change in the lower cervical region. There is degenerative change in the thoracic spine. There are no blastic or lytic bone lesions. No chest wall lesions evident. Review of the MIP images confirms the above findings. IMPRESSION: 1. No demonstrable pulmonary embolus. No thoracic aortic aneurysm or dissection. There are foci aortic atherosclerosis as well as great vessel and coronary artery calcifications. 2. Central bronchitis bilaterally. Areas of atelectatic change in each lower lobe with suspected superimposed foci of pneumonia in these areas. No pleural effusions. 3. Focal new area of ground-glass type opacity in the anterior segment of the left upper lobe. This area measures 1.2 x 0.8 cm. Initial follow-up with CT at 6-12 months is recommended to confirm persistence. If persistent, repeat CT is recommended every 2 years until 5 years of stability has been established. This recommendation follows the consensus statement: Guidelines for Management of Incidental Pulmonary Nodules Detected on CT Images: From the Fleischner Society 2017; Radiology 2017; 284:228-243. 4.  No appreciable adenopathy. 5. 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 6.  Nonobstructing calculi in upper pole right kidney. Aortic Atherosclerosis (ICD10-I70.0). Electronically Signed   By: Lowella Grip III M.D.   On: 07/03/2019 09:30    Scheduled Meds:  Chlorhexidine Gluconate Cloth  6 each Topical Daily   dextromethorphan-guaiFENesin  1 tablet Oral BID   enoxaparin (LOVENOX) injection  40 mg Subcutaneous Q24H   feeding supplement (PRO-STAT SUGAR FREE 64)  30 mL Oral TID BM   fluticasone  2 spray Each Nare Daily   ipratropium-albuterol  3 mL Nebulization Q6H   levofloxacin  750 mg Oral Daily   loratadine  10 mg Oral Daily   methylPREDNISolone (SOLU-MEDROL) injection  60 mg Intravenous Q8H   mometasone-formoterol  2 puff Inhalation BID   multivitamin with minerals  1 tablet Oral Daily   pantoprazole  40 mg Oral Daily   simvastatin  40 mg Oral QHS   traZODone  150 mg Oral QHS   Continuous Infusions:  sodium chloride Stopped (07/04/19 1422)   dextrose 5 % and 0.45% NaCl 75 mL/hr at 07/04/19 1800   insulin Stopped (07/04/19 1621)     LOS: 5 days   Marylu Lund, MD [facility] Pager On Amion  If 7PM-7AM, please contact night-coverage 07/04/2019, 6:10 PM

## 2019-07-04 NOTE — Progress Notes (Addendum)
Inpatient Diabetes Program Recommendations  AACE/ADA: New Consensus Statement on Inpatient Glycemic Control (2015)  Target Ranges:  Prepandial:   less than 140 mg/dL      Peak postprandial:   less than 180 mg/dL (1-2 hours)      Critically ill patients:  140 - 180 mg/dL   Lab Results  Component Value Date   GLUCAP 343 (H) 07/04/2019   HGBA1C 6.5 (H) 04/17/2019    Review of Glycemic Control Results for James [name], James [name] (MRN [id number]) as of 07/04/2019 08:38  Ref. Range 07/03/2019 07:50 07/03/2019 11:33 07/03/2019 16:05 07/03/2019 20:09 07/04/2019 08:00  Glucose-Capillary Latest Ref Range: 70 - 99 mg/dL 193 (H)  Novolog 3 units 348 (H)  Novolog 10 units  Solumedrol 60 mg 204 (H)  Novolog 6 units  Solumedrol 60 mg 222 (H)  Novolog 2 units  Lantus 5 units  Solumedrol 60 mg given 0332 am 343 (H)  Novolog 8 units    Review of Glycemic Control  Diabetes history: DM1 Outpatient Diabetes medications: Insulin Pump Current orders for Inpatient glycemic control: Lantus 5 units qhs Novolog 0-9 units tid + hs Novolog 3 units tid meal coverage  Solumedrol 60 mg Q8 hour  Inpatient Diabetes Program Recommendations:      Glucose trends 300-500 range. IV insulin being started by MD. Will see what insulin needs the pt requires at this time.  Spoke with James [name], CDE pump trainer over the phone. She was called by wife who was concerned over the glucose trends.  I called James [name] office (pt's Endocrinologist) to get in touch with James [name], the pump trainer the pt sees to get insulin pump settings to match inpatient SQ regimen to. Left voicemail at 0931 am.  Addendum  1105 am: Spoke to James [name], Environmental consultant to [place], CDE pump trainer. Pt's last visit was Feb 2020  Pump settings Basal rate  12A  1.5 units/hour  2A  1.4 units/hour  7A  1.7 units/hour  2P  1.15 units/hour  6P  1.4 units/hour  Total of 34.9 units of basal insulin in 24 hours  Carbohydrate  coverage: 1 unit for very 8 grams of carbs Sensitivity: 1 unit drops glucose 25 points Target glucose: 120 mg/dl  Addendum 1152 am:  James [name] called again after speaking with James [name]. If pt transitions to insulin pump while on steroids, pt will need to take the insulin pump off Auto mode and turn to manual mode and will need to do temporary basal of 125%.   Thanks,  James Headings RN, MSN, BC-ADM Inpatient Diabetes Coordinator Team Pager [phone] (8a-5p)

## 2019-07-04 NOTE — Progress Notes (Signed)
Report given to Bailey, RN

## 2019-07-04 NOTE — Progress Notes (Signed)
CRITICAL VALUE ALERT  Critical Value:  CBG 542  Date & Time Notied:  07/04/2019  Provider Notified: Dr. Wyline Copas  Orders Received/Actions taken: Pt to tx to [place]

## 2019-07-05 LAB — GLUCOSE, CAPILLARY
Glucose-Capillary: 116 mg/dL — ABNORMAL HIGH (ref 70–99)
Glucose-Capillary: 133 mg/dL — ABNORMAL HIGH (ref 70–99)
Glucose-Capillary: 146 mg/dL — ABNORMAL HIGH (ref 70–99)
Glucose-Capillary: 153 mg/dL — ABNORMAL HIGH (ref 70–99)
Glucose-Capillary: 154 mg/dL — ABNORMAL HIGH (ref 70–99)
Glucose-Capillary: 161 mg/dL — ABNORMAL HIGH (ref 70–99)
Glucose-Capillary: 262 mg/dL — ABNORMAL HIGH (ref 70–99)
Glucose-Capillary: 181 mg/dL — ABNORMAL HIGH (ref 70–99)
Glucose-Capillary: 237 mg/dL — ABNORMAL HIGH (ref 70–99)
Glucose-Capillary: 200 mg/dL — ABNORMAL HIGH (ref 70–99)
Glucose-Capillary: 172 mg/dL — ABNORMAL HIGH (ref 70–99)
Glucose-Capillary: 229 mg/dL — ABNORMAL HIGH (ref 70–99)
Glucose-Capillary: 250 mg/dL — ABNORMAL HIGH (ref 70–99)
Glucose-Capillary: 311 mg/dL — ABNORMAL HIGH (ref 70–99)
Glucose-Capillary: 142 mg/dL — ABNORMAL HIGH (ref 70–99)
Glucose-Capillary: 179 mg/dL — ABNORMAL HIGH (ref 70–99)
Glucose-Capillary: 247 mg/dL — ABNORMAL HIGH (ref 70–99)
Glucose-Capillary: 264 mg/dL — ABNORMAL HIGH (ref 70–99)
Glucose-Capillary: 130 mg/dL — ABNORMAL HIGH (ref 70–99)
Glucose-Capillary: 227 mg/dL — ABNORMAL HIGH (ref 70–99)

## 2019-07-05 LAB — BASIC METABOLIC PANEL
Anion gap: 8 (ref 5–15)
BUN: 25 mg/dL — ABNORMAL HIGH (ref 8–23)
CO2: 24 mmol/L (ref 22–32)
Calcium: 8.2 mg/dL — ABNORMAL LOW (ref 8.9–10.3)
Chloride: 101 mmol/L (ref 98–111)
Creatinine, Ser: 0.79 mg/dL (ref 0.61–1.24)
GFR calc Af Amer: >60 mL/min (ref >60)
GFR calc non Af Amer: >60 mL/min (ref >60)
Glucose, Bld: 135 mg/dL — ABNORMAL HIGH (ref 70–99)
Potassium: 4.2 mmol/L (ref 3.5–5.1)
Sodium: 133 mmol/L — ABNORMAL LOW (ref 135–145)

## 2019-07-05 MED ORDER — INSULIN ASPART 100 UNIT/ML ~~LOC~~ SOLN: 0.0000 [IU] | Freq: Every day | SUBCUTANEOUS | Status: DC

## 2019-07-05 MED ORDER — INSULIN GLARGINE 100 UNIT/ML ~~LOC~~ SOLN
20.0000 [IU] | Freq: Every day | SUBCUTANEOUS | Status: DC
  Administered 2019-07-05: 20 [IU] via SUBCUTANEOUS
  Filled 2019-07-05 (×2): qty 0.2

## 2019-07-05 MED ORDER — INSULIN ASPART 100 UNIT/ML ~~LOC~~ SOLN
0.0000 [IU] | Freq: Three times a day (TID) | SUBCUTANEOUS | Status: DC
  Administered 2019-07-06: 3 [IU] via SUBCUTANEOUS
  Administered 2019-07-06: 9 [IU] via SUBCUTANEOUS

## 2019-07-05 MED ORDER — INSULIN ASPART 100 UNIT/ML ~~LOC~~ SOLN
5.0000 [IU] | Freq: Three times a day (TID) | SUBCUTANEOUS | Status: DC
  Administered 2019-07-06 – 2019-07-08 (×8): 5 [IU] via SUBCUTANEOUS

## 2019-07-05 NOTE — Progress Notes (Signed)
PROGRESS NOTE    James [name]  [id number] DOB: October 29, 1955 DOA: 06/28/2019 PCP: [name], MD    Brief Narrative:  63 y.o.malewith medical history significant ofAsthma, CAD, T1DM (on insulin pump), HLD, GERD, Depression, and Tobacco abuse who presents with worsening shortness of breath, cough, chest tightness/wheezing for about 2 weeks. Pt initially quit smoking tobacco for 2 years, (was on Chantix), but recently started smoking about 3 weeks ago.  Due to worsening symptoms, patient had a telephone visit with Pulmonology, NP [name], who had prescribed him [facility] and Tapered steroids, without any significant improvement. Patient had PFTs done in March, mild restriction, normal ratios. Pt admitted for further management.  Assessment & Plan:   Active Problems:   Asthma exacerbation   Acute asthma exacerbation   Acute respiratory failure with hypoxia Asthma exacerbation  -Initially required up to 6 L of supplemental oxygen to maintain sats above 90%, now weaned to [facility] -Inflammatory markers unremarkable, procalcitonin negative -D-dimer mildly elevated at 0.62. CTA chest ordered with no PE, showing bronchitis v pna.  -Covid test/respiratory viral panel negative -Chest x-ray unremarkable -PFTs done in March showed mild restriction, with normal ratios -Continue IV Solu-Medrol, doxycycline, cough suppressant, duo nebs -Repeat COVID test is neg -Currently on PO levaquin -Weaned to room air   Type 1 diabetes mellitus -A1c 6.5 on 03/2019 -Home insulin pump reportedly malfunctioned, not present currently -Had initially continued SSI and Lantus however glucose noted to be very difficult to control with glucose to just under 550 -Improved with insulin gtt overnight -Appreciate input by diabetic coordinator. Recommendation to transition to lantus 20 units with 5 units meal coverage and sensitive scale SSI. Ordered -Would plan to d/c on this regimen and have pt f/u with  endocrinologist to transition back to insulin pump as outpatient. Pt agrees.  GERD -Continue PPI as tolerated  Hyperlipidemia -Continue Zocor as tolerated -Stable at this time  Malnutrition Type:  Nutrition Problem: Increased nutrient needs Etiology: acute illness   Malnutrition Characteristics:  Signs/Symptoms: estimated needs   Nutrition Interventions:  Interventions: Prostat, MVI    Estimated body mass index is 26.24 kg/m as calculated from the following:   Height as of this encounter: 5\' 9"  (1.753 m).   Weight as of this encounter: 80.6 kg.     DVT prophylaxis: Lovenox subQ Code Status: Full Family Communication: Pt in room, family not at bedside Disposition Plan: Uncertain at this time  Consultants:     Procedures:     Antimicrobials: Anti-infectives (From admission, onward)   Start     Dose/Rate Route Frequency Ordered Stop   07/03/19 1230  levofloxacin (LEVAQUIN) tablet 750 mg  Status:  Discontinued     750 mg Oral Daily 07/03/19 1220 07/05/19 1028   06/28/19 2200  doxycycline (VIBRA-TABS) tablet 100 mg  Status:  Discontinued     100 mg Oral 2 times daily 06/28/19 1852 07/03/19 1216   06/28/19 1400  doxycycline (VIBRAMYCIN) 100 mg in sodium chloride 0.9 % 250 mL IVPB     100 mg 125 mL/hr over 120 Minutes Intravenous  Once 06/28/19 1332 06/28/19 1627      Subjective: Without complaints this AM  Objective: Vitals:   07/05/19 1500 07/05/19 1600 07/05/19 1726 07/05/19 1800  BP: (!) 139/57 (!) 150/60 (!) 146/75 (!) 163/76  Pulse: 83 83 86 81  Resp: (!) 22 (!) 22 (!) 27 (!) 24  Temp:  97.9 F (36.6 C)    TempSrc:  Oral    SpO2: [name])  89% 91% 93% 93%  Weight:      Height:        Intake/Output Summary (Last 24 hours) at 07/05/2019 1839 Last data filed at 07/05/2019 1700 Gross per 24 hour  Intake 2818.4 ml  Output 1990 ml  Net 828.4 ml   Filed Weights   06/28/19 1008 07/01/19 0556 07/02/19 0532  Weight: 77.1 kg 80.3 kg 80.6 kg      Examination: General exam: Awake, laying in bed, in nad Respiratory system: Normal respiratory effort, no wheezing Cardiovascular system: regular rate, s1, s2 Gastrointestinal system: Soft, nondistended, positive BS Central nervous system: CN2-12 grossly intact, strength intact Extremities: Perfused, no clubbing Skin: Normal skin turgor, no notable skin lesions seen Psychiatry: Mood normal // no visual hallucinations   Data Reviewed: I have personally reviewed following labs and imaging studies  CBC: Recent Labs  Lab 06/28/19 1934 06/29/19 0514 06/30/19 0619 07/04/19 0516  WBC 5.7 7.1 9.9 6.9  NEUTROABS  --   --  9.2*  --   HGB 14.6 14.7 14.6 14.5  HCT 43.1 43.5 42.9 40.9  MCV 94.9 94.0 94.5 90.1  PLT 272 254 242 [id number]   Basic Metabolic Panel: Recent Labs  Lab 06/28/19 1934 06/29/19 0514 06/30/19 0619 07/04/19 0516 07/04/19 1112 07/05/19 0201  NA  --  136 136 132*  --  133*  K  --  4.1 4.9 4.7  --  4.2  CL  --  104 104 100  --  101  CO2  --  20* 24 24  --  24  GLUCOSE  --  221* 269* 320* 542* 135*  BUN  --  19 33* 25*  --  25*  CREATININE 0.85 0.73 0.90 0.84  --  0.79  CALCIUM  --  8.5* 8.7* 8.3*  --  8.2*   GFR: Estimated Creatinine Clearance: 93.3 mL/min (by C-G formula based on SCr of 0.79 mg/dL). Liver Function Tests: No results for input(s): AST, ALT, ALKPHOS, BILITOT, PROT, ALBUMIN in the last 168 hours. No results for input(s): LIPASE, AMYLASE in the last 168 hours. No results for input(s): AMMONIA in the last 168 hours. Coagulation Profile: No results for input(s): INR, PROTIME in the last 168 hours. Cardiac Enzymes: No results for input(s): CKTOTAL, CKMB, CKMBINDEX, TROPONINI in the last 168 hours. BNP (last 3 results) No results for input(s): PROBNP in the last 8760 hours. HbA1C: No results for input(s): HGBA1C in the last 72 hours. CBG: Recent Labs  Lab 07/05/19 1440 07/05/19 1510 07/05/19 1615 07/05/19 1725 07/05/19 1826  GLUCAP 200*  250* 229* 179* 142*   Lipid Profile: No results for input(s): CHOL, HDL, LDLCALC, TRIG, CHOLHDL, LDLDIRECT in the last 72 hours. Thyroid Function Tests: No results for input(s): TSH, T4TOTAL, FREET4, T3FREE, THYROIDAB in the last 72 hours. Anemia Panel: No results for input(s): VITAMINB12, FOLATE, FERRITIN, TIBC, IRON, RETICCTPCT in the last 72 hours. Sepsis Labs: Recent Labs  Lab 06/29/19 0938  PROCALCITON <0.10    Recent Results (from the past 240 hour(s))  Novel Coronavirus, NAA ([facility])     Status: None   Collection Time: 06/27/19  1:34 PM   Specimen: Nasopharyngeal(NP) swabs in vial transport medium   NASOPHARYNGE  TESTING  Result Value Ref Range Status   SARS-CoV-2, NAA Not Detected Not Detected Final    Comment: This nucleic acid amplification test was developed and its performance characteristics determined by [employer]. Nucleic acid amplification tests include PCR and TMA. This test has not been FDA cleared  or approved. This test has been authorized by FDA under an Emergency Use Authorization (EUA). This test is only authorized for the duration of time the declaration that circumstances exist justifying the authorization of the emergency use of in vitro diagnostic tests for detection of SARS-CoV-2 virus and/or diagnosis of COVID-19 infection under section 564(b)(1) of the Act, 21 U.S.C. [id number]) (1), unless the authorization is terminated or revoked sooner. When diagnostic testing is negative, the possibility of a false negative result should be considered in the context of a patient's recent exposures and the presence of clinical signs and symptoms consistent with COVID-19. An individual without symptoms of COVID-19 and who is not shedding SARS-CoV-2 virus would  expect to have a negative (not detected) result in this assay.   SARS CORONAVIRUS 2 (TAT 6-24 HRS) Nasopharyngeal Nasopharyngeal Swab     Status: None   Collection Time: 06/28/19 12:20 PM    Specimen: Nasopharyngeal Swab  Result Value Ref Range Status   SARS Coronavirus 2 NEGATIVE NEGATIVE Final    Comment: (NOTE) SARS-CoV-2 target nucleic acids are NOT DETECTED. The SARS-CoV-2 RNA is generally detectable in upper and lower respiratory specimens during the acute phase of infection. Negative results do not preclude SARS-CoV-2 infection, do not rule out co-infections with other pathogens, and should not be used as the sole basis for treatment or other patient management decisions. Negative results must be combined with clinical observations, patient history, and epidemiological information. The expected result is Negative. Fact Sheet for Patients: SugarRoll.be Fact Sheet for Healthcare Providers: https://www.woods-mathews.com/ This test is not yet approved or cleared by the [place] FDA and  has been authorized for detection and/or diagnosis of SARS-CoV-2 by FDA under an Emergency Use Authorization (EUA). This EUA will remain  in effect (meaning this test can be used) for the duration of the COVID-19 declaration under Section 56 4(b)(1) of the Act, 21 U.S.C. section 360bbb-3(b)(1), unless the authorization is terminated or revoked sooner. Performed at [facility], [place] [address] [place], [place] [address]   Respiratory Panel by PCR     Status: None   Collection Time: 06/28/19  6:47 PM   Specimen: Nasopharyngeal Swab; Respiratory  Result Value Ref Range Status   Adenovirus NOT DETECTED NOT DETECTED Final   Coronavirus 229E NOT DETECTED NOT DETECTED Final    Comment: (NOTE) The Coronavirus on the Respiratory Panel, DOES NOT test for the novel  Coronavirus (2019 nCoV)    Coronavirus HKU1 NOT DETECTED NOT DETECTED Final   Coronavirus NL63 NOT DETECTED NOT DETECTED Final   Coronavirus OC43 NOT DETECTED NOT DETECTED Final   Metapneumovirus NOT DETECTED NOT DETECTED Final   Rhinovirus / Enterovirus NOT DETECTED NOT  DETECTED Final   Influenza A NOT DETECTED NOT DETECTED Final   Influenza B NOT DETECTED NOT DETECTED Final   Parainfluenza Virus 1 NOT DETECTED NOT DETECTED Final   Parainfluenza Virus 2 NOT DETECTED NOT DETECTED Final   Parainfluenza Virus 3 NOT DETECTED NOT DETECTED Final   Parainfluenza Virus 4 NOT DETECTED NOT DETECTED Final   Respiratory Syncytial Virus NOT DETECTED NOT DETECTED Final   Bordetella pertussis NOT DETECTED NOT DETECTED Final   Chlamydophila pneumoniae NOT DETECTED NOT DETECTED Final   Mycoplasma pneumoniae NOT DETECTED NOT DETECTED Final    Comment: Performed at [facility], [place]. [address], [place], [place] [address]  SARS CORONAVIRUS 2 (TAT 6-24 HRS) Nasopharyngeal Nasopharyngeal Swab     Status: None   Collection Time: 07/03/19  1:28  PM   Specimen: Nasopharyngeal Swab  Result Value Ref Range Status   SARS Coronavirus 2 NEGATIVE NEGATIVE Final    Comment: (NOTE) SARS-CoV-2 target nucleic acids are NOT DETECTED. The SARS-CoV-2 RNA is generally detectable in upper and lower respiratory specimens during the acute phase of infection. Negative results do not preclude SARS-CoV-2 infection, do not rule out co-infections with other pathogens, and should not be used as the sole basis for treatment or other patient management decisions. Negative results must be combined with clinical observations, patient history, and epidemiological information. The expected result is Negative. Fact Sheet for Patients: SugarRoll.be Fact Sheet for Healthcare Providers: https://www.woods-mathews.com/ This test is not yet approved or cleared by the [place] FDA and  has been authorized for detection and/or diagnosis of SARS-CoV-2 by FDA under an Emergency Use Authorization (EUA). This EUA will remain  in effect (meaning this test can be used) for the duration of the COVID-19 declaration under Section 56 4(b)(1) of the Act, 21  U.S.C. section 360bbb-3(b)(1), unless the authorization is terminated or revoked sooner. Performed at [facility], [place] [address] [place], [place] [address]   MRSA PCR Screening     Status: None   Collection Time: 07/04/19  3:56 PM   Specimen: Nasal Mucosa; Nasopharyngeal  Result Value Ref Range Status   MRSA by PCR NEGATIVE NEGATIVE Final    Comment:        The GeneXpert MRSA Assay (FDA approved for NASAL specimens only), is one component of a comprehensive MRSA colonization surveillance program. It is not intended to diagnose MRSA infection nor to guide or monitor treatment for MRSA infections. Performed at [facility], [place] [address], [place], [place] [address]      Radiology Studies: No results found.  Scheduled Meds: . Chlorhexidine Gluconate Cloth  6 each Topical Daily  . dextromethorphan-guaiFENesin  1 tablet Oral BID  . enoxaparin (LOVENOX) injection  40 mg Subcutaneous Q24H  . fluticasone  2 spray Each Nare Daily  . insulin aspart  0-5 Units Subcutaneous QHS  . [START ON 07/06/2019] insulin aspart  0-9 Units Subcutaneous TID WC  . [START ON 07/06/2019] insulin aspart  5 Units Subcutaneous TID WC  . insulin glargine  20 Units Subcutaneous QHS  . ipratropium-albuterol  3 mL Nebulization Q6H  . loratadine  10 mg Oral Daily  . methylPREDNISolone (SOLU-MEDROL) injection  60 mg Intravenous Q8H  . mometasone-formoterol  2 puff Inhalation BID  . multivitamin with minerals  1 tablet Oral Daily  . pantoprazole  40 mg Oral Daily  . simvastatin  40 mg Oral QHS  . traZODone  150 mg Oral QHS   Continuous Infusions: . insulin 4.8 mL/hr at 07/05/19 1700     LOS: 6 days   Marylu Lund, MD [facility] Pager On Amion  If 7PM-7AM, please contact night-coverage 07/05/2019, 6:39 PM

## 2019-07-05 NOTE — Progress Notes (Signed)
Attempted to weigh patient, unable to weigh do to bed malfunction. Will attempt to weigh in AM when patient is awake.

## 2019-07-05 NOTE — Progress Notes (Signed)
Nutrition Follow-up  DOCUMENTATION CODES:   Not applicable  INTERVENTION:  - will d/c prostat per patient preference. - will monitor for needs prior to d/c.   NUTRITION DIAGNOSIS:   Increased nutrient needs related to acute illness as evidenced by estimated needs. -ongoing  GOAL:   Patient will meet greater than or equal to 90% of their needs - minimally met   MONITOR:   PO intake, Supplement acceptance, Labs, Weight trends  ASSESSMENT:   63 y.o. male with medical history significant of asthma, CAD, type 1 DM (on insulin pump), HLD, GERD, depression, and tobacco abuse. He presented to the ED with SOB, cough, and chest tightness. He was in his normal state of health until 12 days PTA. He had previously stopped smoking but began again about 3 weeks ago, smoking 2 cigarettes/Mowrer. He ran out of Symbicort two months ago. He was using his inhaler/nebulizer x12/Divita for the 3 days PTA.  Weight up from 12/10-12/13 and stable from 12/13-12/14; no new weight since 12/14 and night shift RN note states that bed scale is malfunctioning.   Patient with ongoing good appetite, now back to baseline. Per flow sheet documentation, he recently consumed the following at meals: 12/13- 100% of breakfast and dinner (total of 1059 kcal, 33 grams protein) 12/14- 100% of breakfast, 75% of lunch, and 100% of dinner (total of 1434 kcal, 65 grams protein) 12/15- 100% of breakfast and dinner (total of 1131 kcal, 33 grams protein) 12/16- 100% of breakfast (518 kcal, 17 grams protein)  Patient does not like prostat and has only consumed 2 packets of the supplement over the past 6 days.  Per notes: - acute respiratory failure with hypoxia, asthma exacerbation - type 1 DM with A1c of 6.5% in September    Labs reviewed; CBGs: 116-161 mg/dl since 0100, Na: 133 mmol/l, BUN: 25 mg/dl, Ca: 8.2 mg/dl,  Medications reviewed; 9 units novolog x1 dose 12/16, 60 mg solu-medrol TID, daily multivitamin with minerals, 40 mg  oral protonix/Wedel. IVF; D5-1/2 NS @ 75 ml/hr (306 kcal).   Diet Order:   Diet Order            Diet Carb Modified Fluid consistency: Thin; Room service appropriate? Yes  Diet effective now              EDUCATION NEEDS:   No education needs have been identified at this time  Skin:  Skin Assessment: Reviewed RN Assessment  Last BM:  12/13  Height:   Ht Readings from Last 1 Encounters:  06/28/19 '5\' 9"'$  (1.753 m)    Weight:   Wt Readings from Last 1 Encounters:  07/02/19 80.6 kg    Ideal Body Weight:  72.7 kg  BMI:  Body mass index is 26.24 kg/m.  Estimated Nutritional Needs:   Kcal:  1900-2100 kcal  Protein:  95-105 grams  Fluid:  >/= 2 L/Standre     Jarome Matin, MS, RD, LDN, [facility] Inpatient Clinical Dietitian Pager # [phone] After hours/weekend pager # [phone]

## 2019-07-05 NOTE — Progress Notes (Addendum)
Spoke with Dr. Wyline Copas on the phone. Noted that patient's drip rates on the Endotool for last 24 hours has been around 33.9 units total, not counting the 15 units at the beginning when starting IV insulin. Total drip rate= 33.9 units.   Recommend starting with Lantus 15 units daily(weight based 80.6 kg x 0.2 units/kg) or Lantus 20 units (80.6 kg x 0.25 units/kg), Novolog SENSITIVE correction scale TID & HS scale, and Novolog 5 units TID with meals if patient eats at least 50% of meal. Titrate dosages as needed.  Recommend continuing IV insulin for 2-3 more hours. Give Lantus dose 2 hours before stopping drip for transition. Will continue to monitor blood sugars while in the hospital.   Harvel Ricks RN BSN CDE Diabetes Coordinator Pager: [phone]  8am-5pm

## 2019-07-06 LAB — GLUCOSE, CAPILLARY
Glucose-Capillary: 141 mg/dL — ABNORMAL HIGH (ref 70–99)
Glucose-Capillary: 243 mg/dL — ABNORMAL HIGH (ref 70–99)
Glucose-Capillary: 370 mg/dL — ABNORMAL HIGH (ref 70–99)
Glucose-Capillary: 201 mg/dL — ABNORMAL HIGH (ref 70–99)
Glucose-Capillary: 149 mg/dL — ABNORMAL HIGH (ref 70–99)

## 2019-07-06 LAB — BASIC METABOLIC PANEL
Anion gap: 9 (ref 5–15)
BUN: 27 mg/dL — ABNORMAL HIGH (ref 8–23)
CO2: 23 mmol/L (ref 22–32)
Calcium: 8.2 mg/dL — ABNORMAL LOW (ref 8.9–10.3)
Chloride: 97 mmol/L — ABNORMAL LOW (ref 98–111)
Creatinine, Ser: 0.98 mg/dL (ref 0.61–1.24)
GFR calc Af Amer: >60 mL/min (ref >60)
GFR calc non Af Amer: >60 mL/min (ref >60)
Glucose, Bld: 399 mg/dL — ABNORMAL HIGH (ref 70–99)
Potassium: 4.5 mmol/L (ref 3.5–5.1)
Sodium: 129 mmol/L — ABNORMAL LOW (ref 135–145)

## 2019-07-06 LAB — HEMOGLOBIN A1C
Hgb A1c MFr Bld: 6.9 % — ABNORMAL HIGH (ref 4.8–5.6)
Mean Plasma Glucose: 151.33 mg/dL

## 2019-07-06 MED ORDER — INSULIN ASPART 100 UNIT/ML ~~LOC~~ SOLN
0.0000 [IU] | Freq: Three times a day (TID) | SUBCUTANEOUS | Status: DC
  Administered 2019-07-06: 5 [IU] via SUBCUTANEOUS
  Administered 2019-07-07: 8 [IU] via SUBCUTANEOUS
  Administered 2019-07-07: 11 [IU] via SUBCUTANEOUS
  Administered 2019-07-07: 3 [IU] via SUBCUTANEOUS
  Administered 2019-07-08: 5 [IU] via SUBCUTANEOUS
  Administered 2019-07-08: 8 [IU] via SUBCUTANEOUS

## 2019-07-06 MED ORDER — INSULIN ASPART 100 UNIT/ML ~~LOC~~ SOLN: 0.0000 [IU] | Freq: Every day | SUBCUTANEOUS | Status: DC

## 2019-07-06 MED ORDER — METHYLPREDNISOLONE SODIUM SUCC 40 MG IJ SOLR
40.0000 mg | Freq: Three times a day (TID) | INTRAMUSCULAR | Status: DC
  Administered 2019-07-06 – 2019-07-07 (×3): 40 mg via INTRAVENOUS
  Filled 2019-07-06 (×3): qty 1

## 2019-07-06 MED ORDER — INSULIN GLARGINE 100 UNIT/ML ~~LOC~~ SOLN
25.0000 [IU] | Freq: Every day | SUBCUTANEOUS | Status: DC
  Administered 2019-07-06 – 2019-07-07 (×2): 25 [IU] via SUBCUTANEOUS
  Filled 2019-07-06 (×4): qty 0.25

## 2019-07-06 NOTE — Progress Notes (Signed)
Results for RURY, NISBETT (MRN [id number]) as of 07/06/2019 14:07  Ref. Range 07/05/2019 21:35 07/05/2019 22:42 07/05/2019 23:50 07/06/2019 08:06 07/06/2019 11:58  Glucose-Capillary Latest Ref Range: 70 - 99 mg/dL 264 (H) 227 (H) 141 (H) 243 (H) 370 (H)  Noted that blood sugars have been greater than 180 mg/dl today.  Recommend increasing Novolog correction scale to MODERATE TID & HS scale if blood sugars continue to be elevated. May need to titrate Lantus dosage to 25 units at HS. Continue other insulin orders as written.   Harvel Ricks RN BSN CDE Diabetes Coordinator Pager: [phone]  8am-5pm

## 2019-07-06 NOTE — Progress Notes (Signed)
PROGRESS NOTE    James [name]  [id number] DOB: 02/15/1956 DOA: 06/28/2019 PCP: [name], MD    Brief Narrative:  63 y.o.malewith medical history significant ofAsthma, CAD, T1DM (on insulin pump), HLD, GERD, Depression, and Tobacco abuse who presents with worsening shortness of breath, cough, chest tightness/wheezing for about 2 weeks. Pt initially quit smoking tobacco for 2 years, (was on Chantix), but recently started smoking about 3 weeks ago.  Due to worsening symptoms, patient had a telephone visit with Pulmonology, NP [name], who had prescribed him [facility] and Tapered steroids, without any significant improvement. Patient had PFTs done in March, mild restriction, normal ratios. Pt admitted for further management.  Assessment & Plan:   Active Problems:   Asthma exacerbation   Acute asthma exacerbation   Acute respiratory failure with hypoxia Asthma exacerbation  -Initially required up to 6 L of supplemental oxygen to maintain sats above 90%, now weaned to [facility] -Inflammatory markers unremarkable, procalcitonin negative -D-dimer mildly elevated at 0.62. CTA chest ordered with no PE, showing bronchitis v pna.  -Covid test/respiratory viral panel negative -Chest x-ray unremarkable -PFTs done in March showed mild restriction, with normal ratios -Continue IV Solu-Medrol, doxycycline, cough suppressant, duo nebs -Repeat COVID test is neg -Completed course of levaquin -Weaned to room air however pt remains with wheezing  Type 1 diabetes mellitus -A1c 6.5 on 03/2019 -Home insulin pump reportedly malfunctioned, not present currently -Had initially continued SSI and Lantus however glucose noted to be very difficult to control with glucose to just under 550 -Improved with insulin gtt and now transitioned to subq insulin -Appreciate input by diabetic coordinator. Recommendation to increase lantus to 25 units, continue 5 units meal coverage, increase SSI to  moderate -Plan of d/c home with subQ insulin and have pt f/u with endocrinologist to transition back to pump  GERD -Continue PPI as tolerated  Hyperlipidemia -Continue Zocor as tolerated -Stable at this time  Malnutrition Type: Nutrition Problem: Increased nutrient needs Etiology: acute illness  Malnutrition Characteristics: Signs/Symptoms: estimated needs   Nutrition Interventions:  Interventions: Prostat, MVI    Estimated body mass index is 26.24 kg/m as calculated from the following:   Height as of this encounter: 5\' 9"  (1.753 m).   Weight as of this encounter: 80.6 kg.     DVT prophylaxis: Lovenox subQ Code Status: Full Family Communication: Pt in room, family not at bedside Disposition Plan: Uncertain at this time  Consultants:     Procedures:     Antimicrobials: Anti-infectives (From admission, onward)   Start     Dose/Rate Route Frequency Ordered Stop   07/03/19 1230  levofloxacin (LEVAQUIN) tablet 750 mg  Status:  Discontinued     750 mg Oral Daily 07/03/19 1220 07/05/19 1028   06/28/19 2200  doxycycline (VIBRA-TABS) tablet 100 mg  Status:  Discontinued     100 mg Oral 2 times daily 06/28/19 1852 07/03/19 1216   06/28/19 1400  doxycycline (VIBRAMYCIN) 100 mg in sodium chloride 0.9 % 250 mL IVPB     100 mg 125 mL/hr over 120 Minutes Intravenous  Once 06/28/19 1332 06/28/19 1627      Subjective: Eager to move to medical floor  Objective: Vitals:   07/06/19 0400 07/06/19 0600 07/06/19 0800 07/06/19 1200  BP: (!) 144/68 (!) 146/63 (!) 154/72   Pulse: 69 70 (!) 58   Resp: 18 20 16    Temp:   98.1 F (36.7 C) 98.5 F (36.9 C)  TempSrc:   Oral Oral  SpO2:  94% 90% 94%   Weight:      Height:        Intake/Output Summary (Last 24 hours) at 07/06/2019 1518 Last data filed at 07/06/2019 1103 Gross per 24 hour  Intake 879.13 ml  Output 2300 ml  Net -1420.87 ml   Filed Weights   06/28/19 1008 07/01/19 0556 07/02/19 0532  Weight:  77.1 kg 80.3 kg 80.6 kg    Examination: General exam: Conversant, in no acute distress Respiratory system: normal chest rise, clear, wheezing Cardiovascular system: regular rhythm, s1-s2 Gastrointestinal system: Nondistended, nontender, pos BS Central nervous system: No seizures, no tremors Extremities: No cyanosis, no joint deformities Skin: No rashes, no pallor Psychiatry: Affect normal // no auditory hallucinations   Data Reviewed: I have personally reviewed following labs and imaging studies  CBC: Recent Labs  Lab 06/30/19 0619 07/04/19 0516  WBC 9.9 6.9  NEUTROABS 9.2*  --   HGB 14.6 14.5  HCT 42.9 40.9  MCV 94.5 90.1  PLT 242 [id number]   Basic Metabolic Panel: Recent Labs  Lab 06/30/19 0619 07/04/19 0516 07/04/19 1112 07/05/19 0201 07/06/19 1032  NA 136 132*  --  133* 129*  K 4.9 4.7  --  4.2 4.5  CL 104 100  --  101 97*  CO2 24 24  --  24 23  GLUCOSE 269* 320* 542* 135* 399*  BUN 33* 25*  --  25* 27*  CREATININE 0.90 0.84  --  0.79 0.98  CALCIUM 8.7* 8.3*  --  8.2* 8.2*   GFR: Estimated Creatinine Clearance: 76.2 mL/min (by C-G formula based on SCr of 0.98 mg/dL). Liver Function Tests: No results for input(s): AST, ALT, ALKPHOS, BILITOT, PROT, ALBUMIN in the last 168 hours. No results for input(s): LIPASE, AMYLASE in the last 168 hours. No results for input(s): AMMONIA in the last 168 hours. Coagulation Profile: No results for input(s): INR, PROTIME in the last 168 hours. Cardiac Enzymes: No results for input(s): CKTOTAL, CKMB, CKMBINDEX, TROPONINI in the last 168 hours. BNP (last 3 results) No results for input(s): PROBNP in the last 8760 hours. HbA1C: Recent Labs    07/06/19 1032  HGBA1C 6.9*   CBG: Recent Labs  Lab 07/05/19 2135 07/05/19 2242 07/05/19 2350 07/06/19 0806 07/06/19 1158  GLUCAP 264* 227* 141* 243* 370*   Lipid Profile: No results for input(s): CHOL, HDL, LDLCALC, TRIG, CHOLHDL, LDLDIRECT in the last 72 hours. Thyroid Function  Tests: No results for input(s): TSH, T4TOTAL, FREET4, T3FREE, THYROIDAB in the last 72 hours. Anemia Panel: No results for input(s): VITAMINB12, FOLATE, FERRITIN, TIBC, IRON, RETICCTPCT in the last 72 hours. Sepsis Labs: No results for input(s): PROCALCITON, LATICACIDVEN in the last 168 hours.  Recent Results (from the past 240 hour(s))  Novel Coronavirus, NAA ([facility])     Status: None   Collection Time: 06/27/19  1:34 PM   Specimen: Nasopharyngeal(NP) swabs in vial transport medium   NASOPHARYNGE  TESTING  Result Value Ref Range Status   SARS-CoV-2, NAA Not Detected Not Detected Final    Comment: This nucleic acid amplification test was developed and its performance characteristics determined by [employer]. Nucleic acid amplification tests include PCR and TMA. This test has not been FDA cleared or approved. This test has been authorized by FDA under an Emergency Use Authorization (EUA). This test is only authorized for the duration of time the declaration that circumstances exist justifying the authorization of the emergency use of in vitro diagnostic tests for detection of SARS-CoV-2 virus and/or  diagnosis of COVID-19 infection under section 564(b)(1) of the Act, 21 U.S.C. [id number]) (1), unless the authorization is terminated or revoked sooner. When diagnostic testing is negative, the possibility of a false negative result should be considered in the context of a patient's recent exposures and the presence of clinical signs and symptoms consistent with COVID-19. An individual without symptoms of COVID-19 and who is not shedding SARS-CoV-2 virus would  expect to have a negative (not detected) result in this assay.   SARS CORONAVIRUS 2 (TAT 6-24 HRS) Nasopharyngeal Nasopharyngeal Swab     Status: None   Collection Time: 06/28/19 12:20 PM   Specimen: Nasopharyngeal Swab  Result Value Ref Range Status   SARS Coronavirus 2 NEGATIVE NEGATIVE Final    Comment:  (NOTE) SARS-CoV-2 target nucleic acids are NOT DETECTED. The SARS-CoV-2 RNA is generally detectable in upper and lower respiratory specimens during the acute phase of infection. Negative results do not preclude SARS-CoV-2 infection, do not rule out co-infections with other pathogens, and should not be used as the sole basis for treatment or other patient management decisions. Negative results must be combined with clinical observations, patient history, and epidemiological information. The expected result is Negative. Fact Sheet for Patients: SugarRoll.be Fact Sheet for Healthcare Providers: https://www.woods-mathews.com/ This test is not yet approved or cleared by the [place] FDA and  has been authorized for detection and/or diagnosis of SARS-CoV-2 by FDA under an Emergency Use Authorization (EUA). This EUA will remain  in effect (meaning this test can be used) for the duration of the COVID-19 declaration under Section 56 4(b)(1) of the Act, 21 U.S.C. section 360bbb-3(b)(1), unless the authorization is terminated or revoked sooner. Performed at [facility], [place] [address] [place], [place] [address]   Respiratory Panel by PCR     Status: None   Collection Time: 06/28/19  6:47 PM   Specimen: Nasopharyngeal Swab; Respiratory  Result Value Ref Range Status   Adenovirus NOT DETECTED NOT DETECTED Final   Coronavirus 229E NOT DETECTED NOT DETECTED Final    Comment: (NOTE) The Coronavirus on the Respiratory Panel, DOES NOT test for the novel  Coronavirus (2019 nCoV)    Coronavirus HKU1 NOT DETECTED NOT DETECTED Final   Coronavirus NL63 NOT DETECTED NOT DETECTED Final   Coronavirus OC43 NOT DETECTED NOT DETECTED Final   Metapneumovirus NOT DETECTED NOT DETECTED Final   Rhinovirus / Enterovirus NOT DETECTED NOT DETECTED Final   Influenza A NOT DETECTED NOT DETECTED Final   Influenza B NOT DETECTED NOT DETECTED Final   Parainfluenza  Virus 1 NOT DETECTED NOT DETECTED Final   Parainfluenza Virus 2 NOT DETECTED NOT DETECTED Final   Parainfluenza Virus 3 NOT DETECTED NOT DETECTED Final   Parainfluenza Virus 4 NOT DETECTED NOT DETECTED Final   Respiratory Syncytial Virus NOT DETECTED NOT DETECTED Final   Bordetella pertussis NOT DETECTED NOT DETECTED Final   Chlamydophila pneumoniae NOT DETECTED NOT DETECTED Final   Mycoplasma pneumoniae NOT DETECTED NOT DETECTED Final    Comment: Performed at [facility], [place]. [address] [place], [place] [address]  SARS CORONAVIRUS 2 (TAT 6-24 HRS) Nasopharyngeal Nasopharyngeal Swab     Status: None   Collection Time: 07/03/19  1:28 PM   Specimen: Nasopharyngeal Swab  Result Value Ref Range Status   SARS Coronavirus 2 NEGATIVE NEGATIVE Final    Comment: (NOTE) SARS-CoV-2 target nucleic acids are NOT DETECTED. The SARS-CoV-2 RNA is generally detectable in upper and lower respiratory specimens during the acute phase  of infection. Negative results do not preclude SARS-CoV-2 infection, do not rule out co-infections with other pathogens, and should not be used as the sole basis for treatment or other patient management decisions. Negative results must be combined with clinical observations, patient history, and epidemiological information. The expected result is Negative. Fact Sheet for Patients: SugarRoll.be Fact Sheet for Healthcare Providers: https://www.woods-mathews.com/ This test is not yet approved or cleared by the [place] FDA and  has been authorized for detection and/or diagnosis of SARS-CoV-2 by FDA under an Emergency Use Authorization (EUA). This EUA will remain  in effect (meaning this test can be used) for the duration of the COVID-19 declaration under Section 56 4(b)(1) of the Act, 21 U.S.C. section 360bbb-3(b)(1), unless the authorization is terminated or revoked sooner. Performed at [facility], [place]  [address], [place], [place] [address]   MRSA PCR Screening     Status: None   Collection Time: 07/04/19  3:56 PM   Specimen: Nasal Mucosa; Nasopharyngeal  Result Value Ref Range Status   MRSA by PCR NEGATIVE NEGATIVE Final    Comment:        The GeneXpert MRSA Assay (FDA approved for NASAL specimens only), is one component of a comprehensive MRSA colonization surveillance program. It is not intended to diagnose MRSA infection nor to guide or monitor treatment for MRSA infections. Performed at [facility], [place] [address] [place],  [address]      Radiology Studies: No results found.  Scheduled Meds: . Chlorhexidine Gluconate Cloth  6 each Topical Daily  . dextromethorphan-guaiFENesin  1 tablet Oral BID  . enoxaparin (LOVENOX) injection  40 mg Subcutaneous Q24H  . fluticasone  2 spray Each Nare Daily  . insulin aspart  0-15 Units Subcutaneous TID WC  . insulin aspart  0-5 Units Subcutaneous QHS  . insulin aspart  5 Units Subcutaneous TID WC  . insulin glargine  25 Units Subcutaneous QHS  . ipratropium-albuterol  3 mL Nebulization Q6H  . loratadine  10 mg Oral Daily  . methylPREDNISolone (SOLU-MEDROL) injection  40 mg Intravenous Q8H  . mometasone-formoterol  2 puff Inhalation BID  . multivitamin with minerals  1 tablet Oral Daily  . pantoprazole  40 mg Oral Daily  . simvastatin  40 mg Oral QHS  . traZODone  150 mg Oral QHS   Continuous Infusions:    LOS: 7 days   Marylu Lund, MD [facility] Pager On Amion  If 7PM-7AM, please contact night-coverage 07/06/2019, 3:18 PM

## 2019-07-06 NOTE — Progress Notes (Signed)
Pt admitted to floor with all personal belongings at bedside. No c/o pain. No distress noted. See assessment as charted. Will continue to monitor.

## 2019-07-07 LAB — BASIC METABOLIC PANEL
Anion gap: 7 (ref 5–15)
BUN: 25 mg/dL — ABNORMAL HIGH (ref 8–23)
CO2: 22 mmol/L (ref 22–32)
Calcium: 7.9 mg/dL — ABNORMAL LOW (ref 8.9–10.3)
Chloride: 102 mmol/L (ref 98–111)
Creatinine, Ser: 0.82 mg/dL (ref 0.61–1.24)
GFR calc Af Amer: >60 mL/min (ref >60)
GFR calc non Af Amer: >60 mL/min (ref >60)
Glucose, Bld: 177 mg/dL — ABNORMAL HIGH (ref 70–99)
Potassium: 5.3 mmol/L — ABNORMAL HIGH (ref 3.5–5.1)
Sodium: 131 mmol/L — ABNORMAL LOW (ref 135–145)

## 2019-07-07 LAB — GLUCOSE, CAPILLARY
Glucose-Capillary: 159 mg/dL — ABNORMAL HIGH (ref 70–99)
Glucose-Capillary: 282 mg/dL — ABNORMAL HIGH (ref 70–99)
Glucose-Capillary: 317 mg/dL — ABNORMAL HIGH (ref 70–99)
Glucose-Capillary: 231 mg/dL — ABNORMAL HIGH (ref 70–99)
Glucose-Capillary: 117 mg/dL — ABNORMAL HIGH (ref 70–99)
Glucose-Capillary: 51 mg/dL — ABNORMAL LOW (ref 70–99)
Glucose-Capillary: 73 mg/dL (ref 70–99)

## 2019-07-07 MED ORDER — METHYLPREDNISOLONE SODIUM SUCC 40 MG IJ SOLR
40.0000 mg | Freq: Two times a day (BID) | INTRAMUSCULAR | Status: DC
  Administered 2019-07-07 – 2019-07-08 (×2): 40 mg via INTRAVENOUS
  Filled 2019-07-07 (×2): qty 1

## 2019-07-07 NOTE — Progress Notes (Signed)
Patient checked his blood sugar with his insulin pump and wanted this RN to recheck it. Blood sugar reading was 317. MD made a ware and wanted this RN to give sliding scale now based on reading and then continue with meal coverage as scheduled. Will continue to monitor.

## 2019-07-07 NOTE — Progress Notes (Signed)
PROGRESS NOTE    James [name]  [id number] DOB: 1956/05/20 DOA: 06/28/2019 PCP: [name], MD    Brief Narrative:  63 y.o.malewith medical history significant ofAsthma, CAD, T1DM (on insulin pump), HLD, GERD, Depression, and Tobacco abuse who presents with worsening shortness of breath, cough, chest tightness/wheezing for about 2 weeks. Pt initially quit smoking tobacco for 2 years, (was on Chantix), but recently started smoking about 3 weeks ago.  Due to worsening symptoms, patient had a telephone visit with Pulmonology, NP [name], who had prescribed him [facility] and [facility] steroids, without any significant improvement. Patient had PFTs done in March, mild restriction, normal ratios. Pt admitted for further management.  Assessment & Plan:   Active Problems:   Asthma exacerbation   Acute asthma exacerbation   Acute respiratory failure with hypoxia Asthma exacerbation  -Initially required up to 6 L of supplemental oxygen to maintain sats above 90%, now weaned to [facility] -Inflammatory markers unremarkable, procalcitonin negative -D-dimer mildly elevated at 0.62. CTA chest ordered with no PE, showing bronchitis v pna.  -Covid test/respiratory viral panel negative -Chest x-ray unremarkable -PFTs done in March showed mild restriction, with normal ratios -Repeat COVID test is neg -Completed course of levaquin -Weaned to room air however pt still with wheezing -Continue nebs. Weaning IV steroids, wean to 40mg  IV BID  Type 1 diabetes mellitus -A1c 6.5 on 03/2019 -Home insulin pump reportedly malfunctioned, not present currently -Had initially continued SSI and Lantus however glucose noted to be very difficult to control with glucose to just under 550 -Improved with insulin gtt and now transitioned to subq insulin -Appreciate input by diabetic coordinator. Recommendation to increase lantus to 25 units, continue 5 units meal coverage, increase SSI to moderate -Plan of d/c  home with subQ insulin and have pt f/u with endocrinologist to transition back to pump -Continue with SSI as needed  GERD -Continue PPI as tolerated  Hyperlipidemia -Continue Zocor as tolerated -Stable currently  Malnutrition Type: Nutrition Problem: Increased nutrient needs Etiology: acute illness  Malnutrition Characteristics: Signs/Symptoms: estimated needs   Nutrition Interventions:  Interventions: Prostat, MVI    Estimated body mass index is 26.24 kg/m as calculated from the following:   Height as of this encounter: 5\' 9"  (1.753 m).   Weight as of this encounter: 80.6 kg.     DVT prophylaxis: Lovenox subQ Code Status: Full Family Communication: Pt in room, family not at bedside Disposition Plan: Uncertain at this time  Consultants:     Procedures:     Antimicrobials: Anti-infectives (From admission, onward)   Start     Dose/Rate Route Frequency Ordered Stop   07/03/19 1230  levofloxacin (LEVAQUIN) tablet 750 mg  Status:  Discontinued     750 mg Oral Daily 07/03/19 1220 07/05/19 1028   06/28/19 2200  doxycycline (VIBRA-TABS) tablet 100 mg  Status:  Discontinued     100 mg Oral 2 times daily 06/28/19 1852 07/03/19 1216   06/28/19 1400  doxycycline (VIBRAMYCIN) 100 mg in sodium chloride 0.9 % 250 mL IVPB     100 mg 125 mL/hr over 120 Minutes Intravenous  Once 06/28/19 1332 06/28/19 1627      Subjective: Eager to go home soon  Objective: Vitals:   07/07/19 0517 07/07/19 0802 07/07/19 1350 07/07/19 1514  BP: 133/75  133/78   Pulse: 67 65 81 80  Resp: 20 18 17 16   Temp: 98.4 F (36.9 C)  (!) 97.5 F (36.4 C)   TempSrc: Oral  Oral  SpO2: 94% 94% 94% 95%  Weight:      Height:        Intake/Output Summary (Last 24 hours) at 07/07/2019 1533 Last data filed at 07/07/2019 1222 Gross per 24 hour  Intake 590 ml  Output -  Net 590 ml   Filed Weights   06/28/19 1008 07/01/19 0556 07/02/19 0532  Weight: 77.1 kg 80.3 kg 80.6 kg     Examination: General exam: Awake, laying in bed, in nad Respiratory system: Improved air movement, end expiratory wheezing R>L Cardiovascular system: regular rate, s1, s2 Gastrointestinal system: Soft, nondistended, positive BS Central nervous system: CN2-12 grossly intact, strength intact Extremities: Perfused, no clubbing Skin: Normal skin turgor, no notable skin lesions seen Psychiatry: Mood normal // no visual hallucinations   Data Reviewed: I have personally reviewed following labs and imaging studies  CBC: Recent Labs  Lab 07/04/19 0516  WBC 6.9  HGB 14.5  HCT 40.9  MCV 90.1  PLT [id number]   Basic Metabolic Panel: Recent Labs  Lab 07/04/19 0516 07/04/19 1112 07/05/19 0201 07/06/19 1032 07/07/19 0553  NA 132*  --  133* 129* 131*  K 4.7  --  4.2 4.5 5.3*  CL 100  --  101 97* 102  CO2 24  --  24 23 22   GLUCOSE 320* 542* 135* 399* 177*  BUN 25*  --  25* 27* 25*  CREATININE 0.84  --  0.79 0.98 0.82  CALCIUM 8.3*  --  8.2* 8.2* 7.9*   GFR: Estimated Creatinine Clearance: 91 mL/min (by C-G formula based on SCr of 0.82 mg/dL). Liver Function Tests: No results for input(s): AST, ALT, ALKPHOS, BILITOT, PROT, ALBUMIN in the last 168 hours. No results for input(s): LIPASE, AMYLASE in the last 168 hours. No results for input(s): AMMONIA in the last 168 hours. Coagulation Profile: No results for input(s): INR, PROTIME in the last 168 hours. Cardiac Enzymes: No results for input(s): CKTOTAL, CKMB, CKMBINDEX, TROPONINI in the last 168 hours. BNP (last 3 results) No results for input(s): PROBNP in the last 8760 hours. HbA1C: Recent Labs    07/06/19 1032  HGBA1C 6.9*   CBG: Recent Labs  Lab 07/06/19 1158 07/06/19 1751 07/06/19 2042 07/07/19 0736 07/07/19 1210  GLUCAP 370* 201* 149* 159* 282*   Lipid Profile: No results for input(s): CHOL, HDL, LDLCALC, TRIG, CHOLHDL, LDLDIRECT in the last 72 hours. Thyroid Function Tests: No results for input(s): TSH, T4TOTAL,  FREET4, T3FREE, THYROIDAB in the last 72 hours. Anemia Panel: No results for input(s): VITAMINB12, FOLATE, FERRITIN, TIBC, IRON, RETICCTPCT in the last 72 hours. Sepsis Labs: No results for input(s): PROCALCITON, LATICACIDVEN in the last 168 hours.  Recent Results (from the past 240 hour(s))  SARS CORONAVIRUS 2 (TAT 6-24 HRS) Nasopharyngeal Nasopharyngeal Swab     Status: None   Collection Time: 06/28/19 12:20 PM   Specimen: Nasopharyngeal Swab  Result Value Ref Range Status   SARS Coronavirus 2 NEGATIVE NEGATIVE Final    Comment: (NOTE) SARS-CoV-2 target nucleic acids are NOT DETECTED. The SARS-CoV-2 RNA is generally detectable in upper and lower respiratory specimens during the acute phase of infection. Negative results do not preclude SARS-CoV-2 infection, do not rule out co-infections with other pathogens, and should not be used as the sole basis for treatment or other patient management decisions. Negative results must be combined with clinical observations, patient history, and epidemiological information. The expected result is Negative. Fact Sheet for Patients: SugarRoll.be Fact Sheet for Healthcare Providers: https://www.woods-mathews.com/ This test is not  yet approved or cleared by the [place] and  has been authorized for detection and/or diagnosis of SARS-CoV-2 by FDA under an Emergency Use Authorization (EUA). This EUA will remain  in effect (meaning this test can be used) for the duration of the COVID-19 declaration under Section 56 4(b)(1) of the Act, 21 U.S.C. section 360bbb-3(b)(1), unless the authorization is terminated or revoked sooner. Performed at [facility], [place] [address] [place], [place] [address]   Respiratory Panel by PCR     Status: None   Collection Time: 06/28/19  6:47 PM   Specimen: Nasopharyngeal Swab; Respiratory  Result Value Ref Range Status   Adenovirus NOT DETECTED NOT DETECTED Final    Coronavirus 229E NOT DETECTED NOT DETECTED Final    Comment: (NOTE) The Coronavirus on the Respiratory Panel, DOES NOT test for the novel  Coronavirus (2019 nCoV)    Coronavirus HKU1 NOT DETECTED NOT DETECTED Final   Coronavirus NL63 NOT DETECTED NOT DETECTED Final   Coronavirus OC43 NOT DETECTED NOT DETECTED Final   Metapneumovirus NOT DETECTED NOT DETECTED Final   Rhinovirus / Enterovirus NOT DETECTED NOT DETECTED Final   Influenza A NOT DETECTED NOT DETECTED Final   Influenza B NOT DETECTED NOT DETECTED Final   Parainfluenza Virus 1 NOT DETECTED NOT DETECTED Final   Parainfluenza Virus 2 NOT DETECTED NOT DETECTED Final   Parainfluenza Virus 3 NOT DETECTED NOT DETECTED Final   Parainfluenza Virus 4 NOT DETECTED NOT DETECTED Final   Respiratory Syncytial Virus NOT DETECTED NOT DETECTED Final   Bordetella pertussis NOT DETECTED NOT DETECTED Final   Chlamydophila pneumoniae NOT DETECTED NOT DETECTED Final   Mycoplasma pneumoniae NOT DETECTED NOT DETECTED Final    Comment: Performed at [facility], [place]. [address] [place], [place] [address]  SARS CORONAVIRUS 2 (TAT 6-24 HRS) Nasopharyngeal Nasopharyngeal Swab     Status: None   Collection Time: 07/03/19  1:28 PM   Specimen: Nasopharyngeal Swab  Result Value Ref Range Status   SARS Coronavirus 2 NEGATIVE NEGATIVE Final    Comment: (NOTE) SARS-CoV-2 target nucleic acids are NOT DETECTED. The SARS-CoV-2 RNA is generally detectable in upper and lower respiratory specimens during the acute phase of infection. Negative results do not preclude SARS-CoV-2 infection, do not rule out co-infections with other pathogens, and should not be used as the sole basis for treatment or other patient management decisions. Negative results must be combined with clinical observations, patient history, and epidemiological information. The expected result is Negative. Fact Sheet for Patients: SugarRoll.be Fact Sheet  for Healthcare Providers: https://www.woods-mathews.com/ This test is not yet approved or cleared by the [place] FDA and  has been authorized for detection and/or diagnosis of SARS-CoV-2 by FDA under an Emergency Use Authorization (EUA). This EUA will remain  in effect (meaning this test can be used) for the duration of the COVID-19 declaration under Section 56 4(b)(1) of the Act, 21 U.S.C. section 360bbb-3(b)(1), unless the authorization is terminated or revoked sooner. Performed at [facility], [place] [address], [place], [place] [address]   MRSA PCR Screening     Status: None   Collection Time: 07/04/19  3:56 PM   Specimen: Nasal Mucosa; Nasopharyngeal  Result Value Ref Range Status   MRSA by PCR NEGATIVE NEGATIVE Final    Comment:        The GeneXpert MRSA Assay (FDA approved for NASAL specimens only), is one component of a comprehensive MRSA colonization surveillance program. It is not  intended to diagnose MRSA infection nor to guide or monitor treatment for MRSA infections. Performed at [facility], [place] [address] [place], [place] [address]      Radiology Studies: No results found.  Scheduled Meds: . Chlorhexidine Gluconate Cloth  6 each Topical Daily  . dextromethorphan-guaiFENesin  1 tablet Oral BID  . enoxaparin (LOVENOX) injection  40 mg Subcutaneous Q24H  . fluticasone  2 spray Each Nare Daily  . insulin aspart  0-15 Units Subcutaneous TID WC  . insulin aspart  0-5 Units Subcutaneous QHS  . insulin aspart  5 Units Subcutaneous TID WC  . insulin glargine  25 Units Subcutaneous QHS  . ipratropium-albuterol  3 mL Nebulization Q6H  . loratadine  10 mg Oral Daily  . methylPREDNISolone (SOLU-MEDROL) injection  40 mg Intravenous Q12H  . mometasone-formoterol  2 puff Inhalation BID  . multivitamin with minerals  1 tablet Oral Daily  . pantoprazole  40 mg Oral Daily  . simvastatin  40 mg Oral QHS  . traZODone  150 mg Oral  QHS   Continuous Infusions:    LOS: 8 days   Marylu Lund, MD [facility] Pager On Amion  If 7PM-7AM, please contact night-coverage 07/07/2019, 3:33 PM

## 2019-07-07 NOTE — Progress Notes (Signed)
Pts HS CBG was 51. He ate a snack and rechecked for 73. Paged on call NP about lantus dose tonight, she ordered to reduce the dose from 25 units to 12 units. Will recheck CBG at 2am per pts request. Hortencia Conradi RN

## 2019-07-08 LAB — BASIC METABOLIC PANEL
Anion gap: 8 (ref 5–15)
BUN: 28 mg/dL — ABNORMAL HIGH (ref 8–23)
CO2: 24 mmol/L (ref 22–32)
Calcium: 8.1 mg/dL — ABNORMAL LOW (ref 8.9–10.3)
Chloride: 98 mmol/L (ref 98–111)
Creatinine, Ser: 0.84 mg/dL (ref 0.61–1.24)
GFR calc Af Amer: >60 mL/min (ref >60)
GFR calc non Af Amer: >60 mL/min (ref >60)
Glucose, Bld: 261 mg/dL — ABNORMAL HIGH (ref 70–99)
Potassium: 4.2 mmol/L (ref 3.5–5.1)
Sodium: 130 mmol/L — ABNORMAL LOW (ref 135–145)

## 2019-07-08 LAB — GLUCOSE, CAPILLARY
Glucose-Capillary: 256 mg/dL — ABNORMAL HIGH (ref 70–99)
Glucose-Capillary: 274 mg/dL — ABNORMAL HIGH (ref 70–99)
Glucose-Capillary: 267 mg/dL — ABNORMAL HIGH (ref 70–99)
Glucose-Capillary: 241 mg/dL — ABNORMAL HIGH (ref 70–99)
Glucose-Capillary: 260 mg/dL — ABNORMAL HIGH (ref 70–99)

## 2019-07-08 MED ORDER — INSULIN LISPRO (0.5 UNIT DIAL) 100 UNIT/ML (KWIKPEN JR): 5.0000 [IU] | PEN_INJECTOR | Freq: Three times a day (TID) | SUBCUTANEOUS | 0 refills | Status: DC

## 2019-07-08 MED ORDER — IPRATROPIUM-ALBUTEROL 0.5-2.5 (3) MG/3ML IN SOLN: 3.0000 mL | Freq: Two times a day (BID) | RESPIRATORY_TRACT | Status: DC

## 2019-07-08 MED ORDER — INSULIN PEN NEEDLE 31G X 5 MM MISC: 1.0000 | Freq: Four times a day (QID) | 0 refills | Status: DC

## 2019-07-08 MED ORDER — INSULIN LISPRO (1 UNIT DIAL) 100 UNIT/ML (KWIKPEN): 5.0000 [IU] | PEN_INJECTOR | Freq: Three times a day (TID) | SUBCUTANEOUS | 0 refills | Status: DC

## 2019-07-08 MED ORDER — PREDNISONE 10 MG PO TABS: ORAL_TABLET | ORAL | 0 refills | Status: DC

## 2019-07-08 MED ORDER — INSULIN LISPRO (0.5 UNIT DIAL) 100 UNIT/ML (KWIKPEN JR): PEN_INJECTOR | SUBCUTANEOUS | 0 refills | Status: DC

## 2019-07-08 MED ORDER — HYDROXYZINE PAMOATE 25 MG PO CAPS: 25.0000 mg | ORAL_CAPSULE | ORAL | 0 refills | Status: DC | PRN

## 2019-07-08 MED ORDER — INSULIN GLARGINE 100 UNIT/ML ~~LOC~~ SOLN: 20.0000 [IU] | Freq: Every day | SUBCUTANEOUS | 0 refills | Status: DC

## 2019-07-08 NOTE — Progress Notes (Signed)
Pt discharged home with spouse in stable condition. Discharge instructions and scripts given. No immediate questions or concerns at this time. Pt discharged from unit via wheelchair.

## 2019-07-08 NOTE — Discharge Summary (Signed)
Physician Discharge Summary  James [name] [id number] DOB: [birth year] DOA: 06/28/2019  PCP: [name], MD  Admit date: 06/28/2019 Discharge date: 07/08/2019  Admitted From: Home Disposition:  Home  Recommendations for Outpatient Follow-up:  1. Follow up with PCP in 1-2 weeks 2. Follow up with Endocrinology as scheduled 3. Follow up with Dr. Elsworth Soho as scheduled 4. Patient to be discharged on subQ insulin and will have pt follow up with Endocrine to transition back to insulin pump as outpatient  Discharge Condition:Improved CODE STATUS:Full Diet recommendation: Diabetic   Brief/Interim Summary: 63 y.o.malewith medical history significant ofAsthma, CAD, T1DM (on insulin pump), HLD, GERD, Depression, and Tobacco abuse who presents with worsening shortness of breath, cough, chest tightness/wheezing for about 2 weeks. Pt initially quit smoking tobacco for 2 years, (was on Chantix), but recently started smoking about 3 weeks ago. Due to worsening symptoms, patient had a telephone visit with Pulmonology, NP [name], who had prescribed him [facility] and Tapered steroids, without any significant improvement. Patient had PFTs done in March, mild restriction, normal ratios. Pt admitted for further management.  Discharge Diagnoses:  Active Problems:   Asthma exacerbation   Acute asthma exacerbation   Acute respiratory failure with hypoxia Asthma exacerbation  -Initially required up to 6 L of supplemental oxygen to maintain sats above 90%, ultimately weaned to room air -Inflammatory markers unremarkable, procalcitonin negative -D-dimer mildly elevated at 0.62.CTA chest ordered with no PE, showing bronchitis v pna. -Covid test/respiratory viral panel negative -Chest x-ray unremarkable -PFTs done in March showed mild restriction, with normal ratios -Repeat COVID test is neg -Completed course of levaquin -Improved with nebs and IV steroids -Prescribe prednisone taper on d/c and  have pt f/u with Pulmonologist as scheduled  Type 1 diabetes mellitus -A1c 6.5 on 03/2019 -Home insulin pump reportedly malfunctioned, not present currently -Had initially continued SSI and Lantus however glucose noted to be very difficult to control with glucose to just under 550 -Improved with insulin gtt and now transitioned to subq insulin -Appreciate input by diabetic coordinator. Will d/c on lantus to 20 units, continue 5 units meal coverage with SSI coverage -Plan of d/c home with subQ insulin and have pt f/u with endocrinologist to transition back to pump  GERD -Continue PPI as tolerated  Hyperlipidemia -Continue Zocor as tolerated -Stable currently   Discharge Instructions   Allergies as of 07/08/2019      Reactions   Augmentin [amoxicillin-pot Clavulanate] Nausea And Vomiting, Other (See Comments)   Did it involve swelling of the face/tongue/throat, SOB, or low BP? No Did it involve sudden or severe rash/hives, skin peeling, or any reaction on the inside of your mouth or nose? No Did you need to seek medical attention at a hospital or doctor's office? No When did it last happen?5+ years If all above answers are "NO", may proceed with cephalosporin use.   Lexapro [escitalopram Oxalate] Itching      Medication List    STOP taking these medications   cyclobenzaprine 10 MG tablet Commonly known as: FLEXERIL   doxycycline 100 MG tablet Commonly known as: VIBRA-TABS   insulin lispro 100 UNIT/ML injection Commonly known as: HUMALOG Replaced by: insulin lispro 100 UNIT/ML KwikPen Junior     TAKE these medications   acetaminophen 500 MG tablet Commonly known as: TYLENOL Take 1,000 mg by mouth every 6 (six) hours as needed for moderate pain or headache.   albuterol 108 (90 Base) MCG/ACT inhaler Commonly known as: VENTOLIN HFA INHALE 2 PUFFS BY MOUTH  INTO THE LUNGS EVERY 6 HOURS AS NEEDED FOR WHEEZING OR SHORTNESS OF BREATH. What changed: See the new  instructions.   albuterol (2.5 MG/3ML) 0.083% nebulizer solution Commonly known as: PROVENTIL USE 1 VIAL VIA NEBULIZER EVERY 6 HOURS AS NEEDED FOR WHEEZING OR SHORTNESS OF BREATH What changed: See the new instructions.   budesonide-formoterol 160-4.5 MCG/ACT inhaler Commonly known as: Symbicort INHALE 2 PUFFS INTO THE LUNGS 2 (TWO) TIMES DAILY.   fexofenadine 180 MG tablet Commonly known as: ALLEGRA Take 180 mg by mouth daily.   HYDROcodone-homatropine 5-1.5 MG/5ML syrup Commonly known as: HYCODAN Take 5 mLs by mouth every 6 (six) hours as needed for cough.   insulin glargine 100 UNIT/ML injection Commonly known as: Lantus Inject 0.2 mLs (20 Units total) into the skin at bedtime.   insulin lispro 100 UNIT/ML KwikPen Junior Commonly known as: HUMALOG Inject 0.05 mLs (5 Units total) into the skin 3 (three) times daily before meals. Replaces: insulin lispro 100 UNIT/ML injection   insulin lispro 100 UNIT/ML KwikPen Junior Commonly known as: HUMALOG Sliding scale: CBG 70 - 120: 0 units  CBG 121 - 150: 2 units  CBG 151 - 200: 3 units  CBG 201 - 250: 5 units  CBG 251 - 300: 8 units  CBG 301 - 350: 11 units  CBG 351 - 400: 15 units   Insulin Pen Needle 31G X 5 MM Misc 1 Device by Does not apply route 4 (four) times daily. For use with insulin pens   mometasone-formoterol 100-5 MCG/ACT Aero Commonly known as: DULERA Inhale 2 puffs into the lungs 2 (two) times daily.   ondansetron 4 MG tablet Commonly known as: ZOFRAN Take 1 tablet (4 mg total) by mouth every 6 (six) hours as needed for nausea or vomiting.   oxyCODONE-acetaminophen 5-325 MG tablet Commonly known as: Percocet Take 1 tablet by mouth every 6 (six) hours as needed for severe pain.   polyvinyl alcohol 1.4 % ophthalmic solution Commonly known as: LIQUIFILM TEARS Place 1 drop into both eyes as needed for dry eyes.   predniSONE 10 MG tablet Commonly known as: DELTASONE Taper dose: 60mg  po daily x 3 days,  then 40mg  po daily x 3 days, then 20mg  po daily x 3 days, then 10mg  po daily x 3 days, then 5mg  po daily x 3 days, then stop. Zero refills What changed: additional instructions   simvastatin 40 MG tablet Commonly known as: ZOCOR Take 40 mg by mouth at bedtime.   traZODone 150 MG tablet Commonly known as: DESYREL Take 150 mg by mouth at bedtime.   varenicline 1 MG tablet Commonly known as: CHANTIX Take 1 mg by mouth 2 (two) times daily.            Durable Medical Equipment  (From admission, onward)         Start     Ordered   07/03/19 1040  For home use only DME oxygen  Once    Question Answer Comment  Length of Need 6 Months   Mode or (Route) Nasal cannula   Liters per Minute 3   Frequency Continuous (stationary and portable oxygen unit needed)   Oxygen conserving device Yes   Oxygen delivery system Gas      07/03/19 1039         Follow-up Information    [name], MD. Schedule an appointment as soon as possible for a visit in 2 week(s).   Specialty: Endocrinology Contact information: [place]  RC:[id number]        Rigoberto Noel, MD Follow up.   Specialty: Pulmonary Disease Why: as scheduled Contact information: [place] [address] [place] [address] [phone]        Follow up with your endocrinologist at next available appointment Follow up.          Allergies  Allergen Reactions  . Augmentin [Amoxicillin-Pot Clavulanate] Nausea And Vomiting and Other (See Comments)    Did it involve swelling of the face/tongue/throat, SOB, or low BP? No Did it involve sudden or severe rash/hives, skin peeling, or any reaction on the inside of your mouth or nose? No Did you need to seek medical attention at a hospital or doctor's office? No When did it last happen?5+ years If all above answers are "NO", may proceed with cephalosporin use.   [name] [Escitalopram Oxalate] Itching      Procedures/Studies: CT ANGIO CHEST PE W OR WO CONTRAST  Result Date: 07/03/2019 CLINICAL DATA:  Shortness of breath EXAM: CT ANGIOGRAPHY CHEST WITH CONTRAST TECHNIQUE: Multidetector CT imaging of the chest was performed using the standard protocol during bolus administration of intravenous contrast. Multiplanar CT image reconstructions and MIPs were obtained to evaluate the vascular anatomy. CONTRAST:  126mL OMNIPAQUE IOHEXOL 350 MG/ML SOLN COMPARISON:  CT angiogram chest May 16, 2019; chest radiograph June 28, 2019 FINDINGS: Cardiovascular: There is no demonstrable pulmonary embolus. There is no thoracic aortic aneurysm or dissection. There are occasional foci of calcification in the proximal major great vessels. Note that the right innominate and left common carotid arteries arise as a common trunk, an anatomic variant. There are foci of aortic atherosclerosis as well as foci of coronary artery calcification. There is no pericardial effusion or pericardial thickening. Mediastinum/Nodes: Thyroid appears unremarkable. There are scattered foci of lymph node calcification consistent with prior granulomatous disease. No adenopathy is evident. No esophageal lesions are appreciable. Flattening of the posterior mid and lower trachea is again noted, unchanged in appearance and suspicious for a degree of tracheomalacia. Lungs/Pleura: There is central peribronchial thickening. There are scattered areas of atelectatic change in both lower lobes. There may be a degree of superimposed airspace consolidation in these areas. There is a calcified granuloma in the superior segment left lower lobe, stable. On axial slice 33 series 6, there is a new area of focal ground-glass type opacity in the anterior segment of the left upper lobe measuring 1.2 x 0.8 cm. No pleural effusions are evident. Upper Abdomen: 2-3 mm calculi are noted in the upper pole of the right kidney. There are calcified splenic granulomas.  Visualized upper abdominal structures otherwise appear unremarkable. Musculoskeletal: There is postoperative change in the lower cervical region. There is degenerative change in the thoracic spine. There are no blastic or lytic bone lesions. No chest wall lesions evident. Review of the MIP images confirms the above findings. IMPRESSION: 1. No demonstrable pulmonary embolus. No thoracic aortic aneurysm or dissection. There are foci aortic atherosclerosis as well as great vessel and coronary artery calcifications. 2. Central bronchitis bilaterally. Areas of atelectatic change in each lower lobe with suspected superimposed foci of pneumonia in these areas. No pleural effusions. 3. Focal new area of ground-glass type opacity in the anterior segment of the left upper lobe. This area measures 1.2 x 0.8 cm. Initial follow-up with CT at 6-12 months is recommended to confirm persistence. If persistent, repeat CT is recommended every 2 years until 5 years of stability has been established. This recommendation  follows the consensus statement: Guidelines for Management of Incidental Pulmonary Nodules Detected on CT Images: From the Fleischner Society 2017; Radiology 2017; 284:228-243. 4.  No appreciable adenopathy. 5. 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 6.  Nonobstructing calculi in upper pole right kidney. Aortic Atherosclerosis (ICD10-I70.0). Electronically Signed   By: Lowella Grip III M.D.   On: 07/03/2019 09:30   DG Chest Port 1 View  Result Date: 06/28/2019 CLINICAL DATA:  Shortness of breath EXAM: PORTABLE CHEST 1 VIEW COMPARISON:  October 18, 2018 FINDINGS: The heart size and mediastinal contours are within normal limits. Both lungs are clear. The visualized skeletal structures are unremarkable. IMPRESSION: No active disease. Electronically Signed   By: Prudencio Pair M.D.   On: 06/28/2019 12:29     Subjective: Eager to go home  Discharge Exam: Vitals:   07/08/19 0528  07/08/19 0750  BP: (!) 148/79   Pulse: 79 77  Resp: 20 18  Temp: 98.2 F (36.8 C)   SpO2: 93% 94%   Vitals:   07/07/19 2037 07/07/19 2058 07/08/19 0528 07/08/19 0750  BP:  126/79 (!) 148/79   Pulse:  72 79 77  Resp:  18 20 18   Temp:  98.3 F (36.8 C) 98.2 F (36.8 C)   TempSrc:  Oral Oral   SpO2: 94% 95% 93% 94%  Weight:   79.1 kg   Height:        General: Pt is alert, awake, not in acute distress Cardiovascular: RRR, S1/S2 +, no rubs, no gallops Respiratory: CTA bilaterally, no wheezing, no rhonchi Abdominal: Soft, NT, ND, bowel sounds + Extremities: no edema, no cyanosis   The results of significant diagnostics from this hospitalization (including imaging, microbiology, ancillary and laboratory) are listed below for reference.     Microbiology: Recent Results (from the past 240 hour(s))  SARS CORONAVIRUS 2 (TAT 6-24 HRS) Nasopharyngeal Nasopharyngeal Swab     Status: None   Collection Time: 06/28/19 12:20 PM   Specimen: Nasopharyngeal Swab  Result Value Ref Range Status   SARS Coronavirus 2 NEGATIVE NEGATIVE Final    Comment: (NOTE) SARS-CoV-2 target nucleic acids are NOT DETECTED. The SARS-CoV-2 RNA is generally detectable in upper and lower respiratory specimens during the acute phase of infection. Negative results do not preclude SARS-CoV-2 infection, do not rule out co-infections with other pathogens, and should not be used as the sole basis for treatment or other patient management decisions. Negative results must be combined with clinical observations, patient history, and epidemiological information. The expected result is Negative. Fact Sheet for Patients: SugarRoll.be Fact Sheet for Healthcare Providers: https://www.woods-mathews.com/ This test is not yet approved or cleared by the [place] FDA and  has been authorized for detection and/or diagnosis of SARS-CoV-2 by FDA under an Emergency Use Authorization  (EUA). This EUA will remain  in effect (meaning this test can be used) for the duration of the COVID-19 declaration under Section 56 4(b)(1) of the Act, 21 U.S.C. section 360bbb-3(b)(1), unless the authorization is terminated or revoked sooner. Performed at [facility], [place] [address], [place], [place] [address]   Respiratory Panel by PCR     Status: None   Collection Time: 06/28/19  6:47 PM   Specimen: Nasopharyngeal Swab; Respiratory  Result Value Ref Range Status   Adenovirus NOT DETECTED NOT DETECTED Final   Coronavirus 229E NOT DETECTED NOT DETECTED Final    Comment: (NOTE) The Coronavirus on the Respiratory Panel, DOES NOT test for the novel  Coronavirus (2019 nCoV)    Coronavirus HKU1 NOT DETECTED NOT DETECTED Final   Coronavirus NL63 NOT DETECTED NOT DETECTED Final   Coronavirus OC43 NOT DETECTED NOT DETECTED Final   Metapneumovirus NOT DETECTED NOT DETECTED Final   Rhinovirus / Enterovirus NOT DETECTED NOT DETECTED Final   Influenza A NOT DETECTED NOT DETECTED Final   Influenza B NOT DETECTED NOT DETECTED Final   Parainfluenza Virus 1 NOT DETECTED NOT DETECTED Final   Parainfluenza Virus 2 NOT DETECTED NOT DETECTED Final   Parainfluenza Virus 3 NOT DETECTED NOT DETECTED Final   Parainfluenza Virus 4 NOT DETECTED NOT DETECTED Final   Respiratory Syncytial Virus NOT DETECTED NOT DETECTED Final   Bordetella pertussis NOT DETECTED NOT DETECTED Final   Chlamydophila pneumoniae NOT DETECTED NOT DETECTED Final   Mycoplasma pneumoniae NOT DETECTED NOT DETECTED Final    Comment: Performed at [facility], [place] [address], [place], [place] [address]  SARS CORONAVIRUS 2 (TAT 6-24 HRS) Nasopharyngeal Nasopharyngeal Swab     Status: None   Collection Time: 07/03/19  1:28 PM   Specimen: Nasopharyngeal Swab  Result Value Ref Range Status   SARS Coronavirus 2 NEGATIVE NEGATIVE Final    Comment: (NOTE) SARS-CoV-2 target nucleic acids are NOT DETECTED. The SARS-CoV-2 RNA  is generally detectable in upper and lower respiratory specimens during the acute phase of infection. Negative results do not preclude SARS-CoV-2 infection, do not rule out co-infections with other pathogens, and should not be used as the sole basis for treatment or other patient management decisions. Negative results must be combined with clinical observations, patient history, and epidemiological information. The expected result is Negative. Fact Sheet for Patients: SugarRoll.be Fact Sheet for Healthcare Providers: https://www.woods-mathews.com/ This test is not yet approved or cleared by the [place] FDA and  has been authorized for detection and/or diagnosis of SARS-CoV-2 by FDA under an Emergency Use Authorization (EUA). This EUA will remain  in effect (meaning this test can be used) for the duration of the COVID-19 declaration under Section 56 4(b)(1) of the Act, 21 U.S.C. section 360bbb-3(b)(1), unless the authorization is terminated or revoked sooner. Performed at [facility], [place] [address] [place], [place] [address]   MRSA PCR Screening     Status: None   Collection Time: 07/04/19  3:56 PM   Specimen: Nasal Mucosa; Nasopharyngeal  Result Value Ref Range Status   MRSA by PCR NEGATIVE NEGATIVE Final    Comment:        The GeneXpert MRSA Assay (FDA approved for NASAL specimens only), is one component of a comprehensive MRSA colonization surveillance program. It is not intended to diagnose MRSA infection nor to guide or monitor treatment for MRSA infections. Performed at [facility], [place] [address] [place], [place] [address]      Labs: BNP (last 3 results) Recent Labs    06/28/19 1030 06/30/19 0619  BNP 46.6 [id number]   Basic Metabolic Panel: Recent Labs  Lab 07/04/19 0516 07/04/19 1112 07/05/19 0201 07/06/19 1032 07/07/19 0553 07/08/19 0504  NA 132*  --  133* 129* 131* 130*  K 4.7   --  4.2 4.5 5.3* 4.2  CL 100  --  101 97* 102 98  CO2 24  --  24 23 22 24   GLUCOSE 320* 542* 135* 399* 177* 261*  BUN 25*  --  25* 27* 25* 28*  CREATININE 0.84  --  0.79 0.98 0.82 0.84  CALCIUM 8.3*  --  8.2* 8.2*  7.9* 8.1*   Liver Function Tests: No results for input(s): AST, ALT, ALKPHOS, BILITOT, PROT, ALBUMIN in the last 168 hours. No results for input(s): LIPASE, AMYLASE in the last 168 hours. No results for input(s): AMMONIA in the last 168 hours. CBC: Recent Labs  Lab 07/04/19 0516  WBC 6.9  HGB 14.5  HCT 40.9  MCV 90.1  PLT 209   Cardiac Enzymes: No results for input(s): CKTOTAL, CKMB, CKMBINDEX, TROPONINI in the last 168 hours. BNP: Invalid input(s): POCBNP CBG: Recent Labs  Lab 07/07/19 2124 07/08/19 0101 07/08/19 0209 07/08/19 0532 07/08/19 0742  GLUCAP 73 256* 274* 267* 241*   D-Dimer No results for input(s): DDIMER in the last 72 hours. Hgb A1c Recent Labs    07/06/19 1032  HGBA1C 6.9*   Lipid Profile No results for input(s): CHOL, HDL, LDLCALC, TRIG, CHOLHDL, LDLDIRECT in the last 72 hours. Thyroid function studies No results for input(s): TSH, T4TOTAL, T3FREE, THYROIDAB in the last 72 hours.  Invalid input(s): FREET3 Anemia work up No results for input(s): VITAMINB12, FOLATE, FERRITIN, TIBC, IRON, RETICCTPCT in the last 72 hours. Urinalysis    Component Value Date/Time   COLORURINE YELLOW 04/10/2017 [place] 04/10/2017 0743   LABSPEC 1.015 04/10/2017 0743   PHURINE 6.0 04/10/2017 0743   GLUCOSEU >=500 (A) 04/10/2017 0743   GLUCOSEU 500 (?) 05/08/2010 1434   HGBUR NEGATIVE 04/10/2017 0743   BILIRUBINUR NEGATIVE 04/10/2017 0743   KETONESUR NEGATIVE 04/10/2017 0743   PROTEINUR NEGATIVE 04/10/2017 0743   UROBILINOGEN 0.2 04/11/2013 0008   NITRITE NEGATIVE 04/10/2017 0743   LEUKOCYTESUR NEGATIVE 04/10/2017 0743   Sepsis Labs Invalid input(s): PROCALCITONIN,  WBC,  LACTICIDVEN Microbiology Recent Results (from the past  240 hour(s))  SARS CORONAVIRUS 2 (TAT 6-24 HRS) Nasopharyngeal Nasopharyngeal Swab     Status: None   Collection Time: 06/28/19 12:20 PM   Specimen: Nasopharyngeal Swab  Result Value Ref Range Status   SARS Coronavirus 2 NEGATIVE NEGATIVE Final    Comment: (NOTE) SARS-CoV-2 target nucleic acids are NOT DETECTED. The SARS-CoV-2 RNA is generally detectable in upper and lower respiratory specimens during the acute phase of infection. Negative results do not preclude SARS-CoV-2 infection, do not rule out co-infections with other pathogens, and should not be used as the sole basis for treatment or other patient management decisions. Negative results must be combined with clinical observations, patient history, and epidemiological information. The expected result is Negative. Fact Sheet for Patients: SugarRoll.be Fact Sheet for Healthcare Providers: https://www.woods-mathews.com/ This test is not yet approved or cleared by the [place] FDA and  has been authorized for detection and/or diagnosis of SARS-CoV-2 by FDA under an Emergency Use Authorization (EUA). This EUA will remain  in effect (meaning this test can be used) for the duration of the COVID-19 declaration under Section 56 4(b)(1) of the Act, 21 U.S.C. section 360bbb-3(b)(1), unless the authorization is terminated or revoked sooner. Performed at [facility], [place] [address], [place], [place] [address]   Respiratory Panel by PCR     Status: None   Collection Time: 06/28/19  6:47 PM   Specimen: Nasopharyngeal Swab; Respiratory  Result Value Ref Range Status   Adenovirus NOT DETECTED NOT DETECTED Final   Coronavirus 229E NOT DETECTED NOT DETECTED Final    Comment: (NOTE) The Coronavirus on the Respiratory Panel, DOES NOT test for the novel  Coronavirus (2019 nCoV)    Coronavirus HKU1 NOT DETECTED NOT DETECTED Final   Coronavirus NL63 NOT DETECTED NOT  DETECTED Final    Coronavirus OC43 NOT DETECTED NOT DETECTED Final   Metapneumovirus NOT DETECTED NOT DETECTED Final   Rhinovirus / Enterovirus NOT DETECTED NOT DETECTED Final   Influenza A NOT DETECTED NOT DETECTED Final   Influenza B NOT DETECTED NOT DETECTED Final   Parainfluenza Virus 1 NOT DETECTED NOT DETECTED Final   Parainfluenza Virus 2 NOT DETECTED NOT DETECTED Final   Parainfluenza Virus 3 NOT DETECTED NOT DETECTED Final   Parainfluenza Virus 4 NOT DETECTED NOT DETECTED Final   Respiratory Syncytial Virus NOT DETECTED NOT DETECTED Final   Bordetella pertussis NOT DETECTED NOT DETECTED Final   Chlamydophila pneumoniae NOT DETECTED NOT DETECTED Final   Mycoplasma pneumoniae NOT DETECTED NOT DETECTED Final    Comment: Performed at [facility], [place] [address] [place], [place] [address]  SARS CORONAVIRUS 2 (TAT 6-24 HRS) Nasopharyngeal Nasopharyngeal Swab     Status: None   Collection Time: 07/03/19  1:28 PM   Specimen: Nasopharyngeal Swab  Result Value Ref Range Status   SARS Coronavirus 2 NEGATIVE NEGATIVE Final    Comment: (NOTE) SARS-CoV-2 target nucleic acids are NOT DETECTED. The SARS-CoV-2 RNA is generally detectable in upper and lower respiratory specimens during the acute phase of infection. Negative results do not preclude SARS-CoV-2 infection, do not rule out co-infections with other pathogens, and should not be used as the sole basis for treatment or other patient management decisions. Negative results must be combined with clinical observations, patient history, and epidemiological information. The expected result is Negative. Fact Sheet for Patients: SugarRoll.be Fact Sheet for Healthcare Providers: https://www.woods-mathews.com/ This test is not yet approved or cleared by the [place] FDA and  has been authorized for detection and/or diagnosis of SARS-CoV-2 by FDA under an Emergency Use Authorization (EUA). This EUA will  remain  in effect (meaning this test can be used) for the duration of the COVID-19 declaration under Section 56 4(b)(1) of the Act, 21 U.S.C. section 360bbb-3(b)(1), unless the authorization is terminated or revoked sooner. Performed at [facility], [place] [address], [place], [place] [address]   MRSA PCR Screening     Status: None   Collection Time: 07/04/19  3:56 PM   Specimen: Nasal Mucosa; Nasopharyngeal  Result Value Ref Range Status   MRSA by PCR NEGATIVE NEGATIVE Final    Comment:        The GeneXpert MRSA Assay (FDA approved for NASAL specimens only), is one component of a comprehensive MRSA colonization surveillance program. It is not intended to diagnose MRSA infection nor to guide or monitor treatment for MRSA infections. Performed at [facility], [place] [address] [place], [place] [address]    Time spent: 30 min  SIGNED:   Marylu Lund, MD  [facility] 07/08/2019, 10:22 AM  If 7PM-7AM, please contact night-coverage

## 2019-07-09 ENCOUNTER — Other Ambulatory Visit: Payer: Self-pay | Admitting: *Deleted

## 2019-07-09 ENCOUNTER — Encounter: Payer: Self-pay | Admitting: *Deleted

## 2019-07-09 MED FILL — UNIFINE PENTIPS 31GX3/16: 31G X 5 MM | 25 days supply | Qty: 100 | Fill #0

## 2019-07-09 MED FILL — predniSONE 10 MG TABS: 10 | 15 days supply | Qty: 41 | Fill #0

## 2019-07-09 MED FILL — HYDROXYZINE PAMOATE 25 MG C: 25 | 5 days supply | Qty: 30 | Fill #0

## 2019-07-09 MED FILL — UNIFINE PENTIPS 31GX3/16": 31G X 5 MM | 25 days supply | Qty: 100 | Fill #0

## 2019-07-09 MED FILL — LANTUS SOLOSTAR 100 UNITS/M: 100 | 75 days supply | Qty: 15 | Fill #0

## 2019-07-09 MED FILL — HumaLOG JUNIOR KWIKPEN 100: 100 | 20 days supply | Qty: 3 | Fill #0

## 2019-07-09 MED FILL — HumaLOG 100 UNIT/ML SOLN: 100 | 90 days supply | Qty: 90 | Fill #0

## 2019-07-09 NOTE — Patient Outreach (Signed)
[facility]) Care Management  07/09/2019  James [name] 1955/08/07 [id number]   Transition of care call  Referral received:07/02/19 Initial outreach:07/09/19 Insurance: [place] [employer]    Subjective: Initial successful telephone call to patient's preferred number in order to complete transition of care assessment; 2 HIPAA identifiers verified. Explained purpose of call and completed transition of care assessment.  States he is doing fairly well , he reports breathing is doing much better, denies shortness of breath at this time, reports continuing respiratory treatments as prescribed. He reports  tolerating diet well,  denies bowel or bladder problems.  His spouse is  assisting with his recovery. He states she will pick up his prescriptions on today from pharmacy.  He discussed being active with Active health management chronic condition thinks he missed and telephone appointment while hospitalized and will call his health coach to reschedule.   He states that he is unsure regarding the Hospital indemnity plan and he prefers that I speak with his wife,states he will have her return call to me to provide information, my contact number provided.  Patient discussed that he now has his insulin pump back on and using in manual mode, he has an appointment with [name] on tomorrow for recalibration, . He reports most recent blood sugar today was 160 and highest reading at home has been 200, denies having a low blood sugar reading.  Patient discussed previously resuming smoking , but now he now longer smoking he discussed cost of chantix with insurance plan for next year will be almost $500 per patient, states he plans to discuss with [name] on tomorrow. He reports receiving information on the new diabetes pharmacy benefits and rewards program, and being unsure of how it will benefit him having to offering benefit number to offer further explanation he declined, states he has already  spoken to pharmacy.  Patient requested that he prefers that I speak with his wife regarding benefits of FMLA and hospital indemnity contact information he requested my contact information and states he will have his wife contact me.   He says he  uses a [facility] outpatient pharmacy at [facility]  He denies educational needs related to staying safe during the COVID 19 pandemic.    Objective:  James [name]  was hospitalized at [facility] 12/10-12/20/20 for Acute Asthma exacerbation  Comorbidities include: Diabetes type 1 on insulin pump, Asthma, GERD, Hyperlipidemia.  He  was discharged to home on 07/08/19  without the need for home health services or DME.   Assessment:  Patient voices good understanding of all discharge instructions.  See transition of care flowsheet for assessment details.   Plan:  Reviewed hospital discharge diagnosis of Asthma Exacerbation  and discharge treatment plan using hospital discharge instructions, assessing medication adherence, reviewing problems requiring provider notification, and discussing the importance of follow up with surgeon, primary care provider and/or specialists as directed.  Using [employer] website, verified that patient is an active participate in [place]'s Active Health Management chronic disease management program. Will plan follow up call to patient in the next 4 business days if no return call from his.wife to provide contact resource information on [place] benefits as preferred by patient .     Will route successful outreach letter with [place] Management pamphlet and 24 Hour Nurse Line Magnet to [place] Management clinical pool to be mailed to patient's home address. Thanked patient for their services  to [facility].   Joylene Draft, RN, [place] Management Coordinator  [phone]- Mobile [phone]- Toll Free Main  Office

## 2019-07-10 DIAGNOSIS — Z794 Long term (current) use of insulin: Secondary | ICD-10-CM | POA: Diagnosis not present

## 2019-07-10 DIAGNOSIS — E7849 Other hyperlipidemia: Secondary | ICD-10-CM | POA: Diagnosis not present

## 2019-07-10 DIAGNOSIS — E1029 Type 1 diabetes mellitus with other diabetic kidney complication: Secondary | ICD-10-CM | POA: Diagnosis not present

## 2019-07-10 DIAGNOSIS — R252 Cramp and spasm: Secondary | ICD-10-CM | POA: Diagnosis not present

## 2019-07-10 DIAGNOSIS — Z4681 Encounter for fitting and adjustment of insulin pump: Secondary | ICD-10-CM | POA: Diagnosis not present

## 2019-07-11 ENCOUNTER — Other Ambulatory Visit: Payer: Self-pay | Admitting: *Deleted

## 2019-07-11 NOTE — Patient Outreach (Signed)
[facility]) Care Management  07/11/2019  James [name] 03-31-1956 [id number]   Referral received:07/02/19 Initial outreach:07/09/19 Insurance: [place]  Telephone follow up call to initial transition of care call, to patient home to speak with his wife as he requested that I speak with her regarding making sure she has [place] benefit contact  information. No answer able to leave a HIPAA compliant message for return call.   Objective: [name] was hospitalized [facility] 12/10-12/20/20 for Acute Asthma exacerbation  Comorbidities include: Diabetes type 1 on insulin pump, Asthma, GERD, Hyperlipidemia.  He  was discharged to home on 07/08/19 without the need for home health servicesor DME.  Plan Will await return call if no response will plan return call in the next 4 business [place].    Joylene Draft, RN, [place] Management Coordinator  [phone]- Mobile [phone]- Toll Free Main Office

## 2019-07-16 ENCOUNTER — Other Ambulatory Visit: Payer: Self-pay | Admitting: *Deleted

## 2019-07-16 NOTE — Patient Outreach (Signed)
[facility]) Care Management  07/16/2019  Kyeon Smittle Garno 1956-01-07 [id number]   Referral received:07/02/19 Initial outreach:07/09/19 Insurance: [place] [employer]   Subjective  Telephone follow up call to initial transition of care call, to patient home to speak with his wife as he requested that I speak with her regarding making sure she has [place] benefit contact  information. No answer able to leave a HIPAA compliant message for return call.   Objective: Mr. Quillan hospitalized [facility] 12/10-12/20/20 for Acute Asthma exacerbationComorbidities include: Diabetes type 1 on insulin pump, Asthma, GERD, Hyperlipidemia. Hewas discharged to home on 12/20/20without the need for home health servicesor DME.  Plan Will await return call , if no return call will plan case closure on [place] 10 after initial outreach call on 07/09/19.    Joylene Draft, RN, [place] Management Coordinator  [phone]- Mobile [phone]- Toll Free Main Office

## 2019-07-17 ENCOUNTER — Ambulatory Visit: Payer: 59 | Admitting: Primary Care

## 2019-07-17 ENCOUNTER — Encounter: Payer: Self-pay | Admitting: Primary Care

## 2019-07-17 ENCOUNTER — Other Ambulatory Visit: Payer: Self-pay

## 2019-07-17 ENCOUNTER — Inpatient Hospital Stay: Payer: 59 | Admitting: Pulmonary Disease

## 2019-07-17 VITALS — BP 114/68 | HR 95 | Temp 97.1°F | Ht 69.0 in | Wt 173.2 lb

## 2019-07-17 DIAGNOSIS — Z720 Tobacco use: Secondary | ICD-10-CM | POA: Diagnosis not present

## 2019-07-17 DIAGNOSIS — G40909 Epilepsy, unspecified, not intractable, without status epilepticus: Secondary | ICD-10-CM

## 2019-07-17 DIAGNOSIS — J449 Chronic obstructive pulmonary disease, unspecified: Secondary | ICD-10-CM

## 2019-07-17 DIAGNOSIS — R9389 Abnormal findings on diagnostic imaging of other specified body structures: Secondary | ICD-10-CM | POA: Diagnosis not present

## 2019-07-17 DIAGNOSIS — J4489 Other specified chronic obstructive pulmonary disease: Secondary | ICD-10-CM

## 2019-07-17 DIAGNOSIS — R911 Solitary pulmonary nodule: Secondary | ICD-10-CM

## 2019-07-17 NOTE — Patient Instructions (Addendum)
  Recommend: - Continue Dulera two puffs twice daily  - Use albuterol nebulizer 1-2 times a [place] and as needed every 6 hours for shortness of breath/wheezing - Call insurance and ask what inhaler is formulary on plan similar to Symbicort (let us know and we can send prescription in and start patient assistance if needed) - Will send in chantix refill to start January   Orders: - CT chest wo contrast in 6 months re: opacity LUL  Refer: - Pharmacy for smoking cessation  - Lung cancer screening program (>30 pack year hx)  Follow-up: - 3 months with Dr. Elsworth Soho

## 2019-07-17 NOTE — Progress Notes (Signed)
@Patient  ID: James [name], male    DOB: [birth year], 63 y.o.   MRN: [id number]  Chief Complaint  Patient presents with  . Follow-up    Referring provider: [name], MD  HPI: 63 year old male, former smoker. PMH significant for chronic asthmatic bronchitis, allergic rhinitis, CAD, GERD, DM type 1 (on insulin pump), HLD. Patient of Dr. Elsworth Soho, last seen by pulmonary NP on 06/25/19 for televisit. Unable to afford Symbicort, switched to [facility] and samples given.   Asthma/bronchitis exacerbations: April 2020- Pred taper, zpack February 2020- doxycyline  December 2020- hospitalized, levaquin/prednisone taper   Previous [facility] pulmonary encounter: 06/25/2019 Patient contacted today for acute televisit. Reports increased cough x 1 week. Associated shortness of breath, wheezing and chest tightness. He ran out of his Symbicort 6 weeks ago, has been using Albuterol rescue inhaler 8-10x [place]. States that he has not filled Symbicort prescription d/t Cost of medication which is approx $70/month. Sounds like he may have been getting some type of patient assistance previously. He is diabetic and has an insulin pump, states that he can take prednisone safely. Experiences some nausea in the morning, unclear reason may be due to PND. No known sick contacts. Denies fever, chills, sweats, chest pain, abdominal pain, vomiting.   07/17/2019 Patient presents today for hospital follow-up. He was recently hospitalized 12/10-12/20 for asthma exacerbation/pneumonia treated with Levaquin and prednisone taper. He is doing much better but reports he is still not 100%. Reports that his wheezing has improved. He does not have a significant cough. He has been taking Dulera as prescribed. Using albuterol nebulizer once daily as maintenance. He will finish prednisone course in 2-3 days. Saw his diabetic educator recently and insulin pump was re-programmed. Reports that his A1C was 6.5. He continues to smoke a few cigarettes daily.     PFTs 09/18/18- FEV1 2.56 (75), ratio 76- Mild restriction  Imaging: 07/03/19 CTA- No evidence pulmonary embolus. Central bronchitis bilaterally. Areas of atelectatic change ineach lower lobe with suspected superimposed foci of pneumonia in these areas. No pleural effusions. Focal new area of ground-glass type opacity in the anterior segment of the left upper lobe. This area measures 1.2 x 0.8 cm.  Allergies  Allergen Reactions  . Augmentin [Amoxicillin-Pot Clavulanate] Nausea And Vomiting and Other (See Comments)    Did it involve swelling of the face/tongue/throat, SOB, or low BP? No Did it involve sudden or severe rash/hives, skin peeling, or any reaction on the inside of your mouth or nose? No Did you need to seek medical attention at a hospital or doctor's office? No When did it last happen?5+ years If all above answers are "NO", may proceed with cephalosporin use.   [name] [Escitalopram Oxalate] Itching    Immunization History  Administered Date(s) Administered  . Influenza Split 04/18/2017, 03/28/2019  . Influenza Whole 07/31/2007, 04/15/2009, 05/08/2010  . Influenza,inj,Quad PF,6+ Mos 05/19/2016  . Influenza-Unspecified 03/19/2015  . Pneumococcal Polysaccharide-23 04/19/2003, 04/11/2019  . Td 12/22/2000  . Tdap 08/19/2013    Past Medical History:  Diagnosis Date  . ANXIETY 04/03/2007  . Anxiety   . ASTHMA 09/06/2008  . Asthma   . ASTHMATIC BRONCHITIS, ACUTE 10/25/2008  . Bladder neck obstruction   . CARPAL TUNNEL SYNDROME, BILATERAL 07/31/2007   issues resolved, no surgery  . Cervical disc disease   . Chronic bronchitis ([place])    "get it about q yr" (02/12/2014)  . COPD (chronic obstructive pulmonary disease) ([place])   . CORONARY ARTERY DISEASE 04/03/2007  .  DEPRESSION 04/03/2007  . Depression   . DIABETES MELLITUS, TYPE I 04/03/2007  . Diabetic retinopathy associated with diabetes mellitus due to underlying condition ([place]) 04/03/2007  . DM W/EYE  MANIFESTATIONS, TYPE I, UNCONTROLLED 04/04/2007  . 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 ED (erectile dysfunction)   . History of kidney stones   . HYPERLIPIDEMIA 04/04/2007  . Pneumonia    "several times and again today" (02/13/2104)  . Renal insufficiency   . Seizures ([place])    "insulin seizure from time to time; none in the last couple years" (02/12/2014)  . Spinal stenosis     Tobacco History: Social History   Tobacco Use  Smoking Status Current Every Petre Smoker  . Packs/[place]: 0.00  . Types: Cigarettes  . Last attempt to quit: 02/09/2017  . Years since quitting: 2.4  Smokeless Tobacco Never Used  Tobacco Comment   resumed smoking in fall of 2017   Ready to quit: Not Answered Counseling given: Not Answered Comment: resumed smoking in fall of 2017   Outpatient Medications Prior to Visit  Medication Sig Dispense Refill  . acetaminophen (TYLENOL) 500 MG tablet Take 1,000 mg by mouth every 6 (six) hours as needed for moderate pain or headache.    . albuterol (PROVENTIL) (2.5 MG/3ML) 0.083% nebulizer solution USE 1 VIAL VIA NEBULIZER EVERY 6 HOURS AS NEEDED FOR WHEEZING OR SHORTNESS OF BREATH (Patient taking differently: Take 2.5 mg by nebulization every 6 (six) hours as needed for wheezing or shortness of breath. ) 180 mL 3  . albuterol (VENTOLIN HFA) 108 (90 Base) MCG/ACT inhaler INHALE 2 PUFFS BY MOUTH INTO THE LUNGS EVERY 6 HOURS AS NEEDED FOR WHEEZING OR SHORTNESS OF BREATH. (Patient taking differently: Inhale 2 puffs into the lungs every 6 (six) hours as needed for wheezing or shortness of breath. ) 18 g 2  . fexofenadine (ALLEGRA) 180 MG tablet Take 180 mg by mouth daily.    . hydrOXYzine (VISTARIL) 25 MG capsule Take 1 capsule (25 mg total) by mouth every 4 (four) hours as needed for anxiety. 30 capsule 0  . insulin lispro (HUMALOG) 100 UNIT/ML KwikPen Junior Inject 0.05 mLs (5 Units total) into the skin 3 (three) times daily before meals. 3 mL 0  . insulin lispro  (HUMALOG) 100 UNIT/ML KwikPen Junior Sliding scale: CBG 70 - 120: 0 units  CBG 121 - 150: 2 units  CBG 151 - 200: 3 units  CBG 201 - 250: 5 units  CBG 251 - 300: 8 units  CBG 301 - 350: 11 units  CBG 351 - 400: 15 units 3 mL 0  . Insulin Pen Needle 31G X 5 MM MISC 1 Device by Does not apply route 4 (four) times daily. For use with insulin pens 100 each 0  . mometasone-formoterol (DULERA) 100-5 MCG/ACT AERO Inhale 2 puffs into the lungs 2 (two) times daily. 8.8 g 0  . ondansetron (ZOFRAN) 4 MG tablet Take 1 tablet (4 mg total) by mouth every 6 (six) hours as needed for nausea or vomiting. 30 tablet 0  . oxyCODONE-acetaminophen (PERCOCET) 5-325 MG tablet Take 1 tablet by mouth every 6 (six) hours as needed for severe pain. 42 tablet 0  . polyvinyl alcohol (LIQUIFILM TEARS) 1.4 % ophthalmic solution Place 1 drop into both eyes as needed for dry eyes.    . predniSONE (DELTASONE) 10 MG tablet Taper dose: 60mg  po daily x 3 days, then 40mg  po daily x 3 days, then 20mg  po daily x  3 days, then 10mg  po daily x 3 days, then 5mg  po daily x 3 days, then stop. Zero refills  0  . simvastatin (ZOCOR) 40 MG tablet Take 40 mg by mouth at bedtime.   5  . traZODone (DESYREL) 150 MG tablet Take 150 mg by mouth at bedtime.  5  . varenicline (CHANTIX) 1 MG tablet Take 1 mg by mouth 2 (two) times daily.    . budesonide-formoterol (SYMBICORT) 160-4.5 MCG/ACT inhaler INHALE 2 PUFFS INTO THE LUNGS 2 (TWO) TIMES DAILY. (Patient not taking: Reported on 06/25/2019) 1 Inhaler 6  . HYDROcodone-homatropine (HYCODAN) 5-1.5 MG/5ML syrup Take 5 mLs by mouth every 6 (six) hours as needed for cough. (Patient not taking: Reported on 07/17/2019) 240 mL 0  . insulin glargine (LANTUS) 100 UNIT/ML injection Inject 0.2 mLs (20 Units total) into the skin at bedtime. (Patient not taking: Reported on 07/17/2019) 10 mL 0   No facility-administered medications prior to visit.   Review of Systems  Review of Systems  Constitutional:  Negative.   Respiratory: Negative for cough, shortness of breath and wheezing.   Cardiovascular: Negative.    Physical Exam  BP 114/68 (BP Location: Left Arm, Cuff Size: Normal)   Pulse 95   Temp (!) 97.1 F (36.2 C) (Temporal)   Ht 5\' 9"  (1.753 m)   Wt 173 lb 3.2 oz (78.6 kg)   SpO2 98% Comment: RA  BMI 25.58 kg/m  Physical Exam Constitutional:      Appearance: Normal appearance. He is well-developed.  HENT:     Head: Normocephalic and atraumatic.     Mouth/Throat:     Comments: Deferred d/t masking Cardiovascular:     Rate and Rhythm: Normal rate and regular rhythm.     Heart sounds: Normal heart sounds.  Pulmonary:     Effort: Pulmonary effort is normal. No respiratory distress.     Breath sounds: Normal breath sounds. No wheezing.  Musculoskeletal:     Cervical back: Normal range of motion and neck supple.  Skin:    General: Skin is warm and dry.     Findings: No erythema or rash.  Neurological:     Mental Status: He is alert and oriented to person, place, and time.  Psychiatric:        Mood and Affect: Mood normal.        Behavior: Behavior normal.        Thought Content: Thought content normal.        Judgment: Judgment normal.      Lab Results:  CBC    Component Value Date/Time   WBC 6.9 07/04/2019 0516   RBC 4.54 07/04/2019 0516   HGB 14.5 07/04/2019 0516   HCT 40.9 07/04/2019 0516   PLT 209 07/04/2019 0516   MCV 90.1 07/04/2019 0516   MCH 31.9 07/04/2019 0516   MCHC 35.5 07/04/2019 0516   RDW 11.6 07/04/2019 0516   LYMPHSABS 0.3 (L) 06/30/2019 0619   MONOABS 0.3 06/30/2019 0619   EOSABS 0.0 06/30/2019 0619   BASOSABS 0.0 06/30/2019 0619    BMET    Component Value Date/Time   NA 130 (L) 07/08/2019 0504   K 4.2 07/08/2019 0504   CL 98 07/08/2019 0504   CO2 24 07/08/2019 0504   GLUCOSE 261 (H) 07/08/2019 0504   BUN 28 (H) 07/08/2019 0504   CREATININE 0.84 07/08/2019 0504   CALCIUM 8.1 (L) 07/08/2019 0504   GFRNONAA >60 07/08/2019 0504    GFRAA >60 07/08/2019 [id number]  BNP    Component Value Date/Time   BNP 48.7 06/30/2019 0619    ProBNP No results found for: PROBNP  Imaging: CT ANGIO CHEST PE W OR WO CONTRAST  Result Date: 07/03/2019 CLINICAL DATA:  Shortness of breath EXAM: CT ANGIOGRAPHY CHEST WITH CONTRAST TECHNIQUE: Multidetector CT imaging of the chest was performed using the standard protocol during bolus administration of intravenous contrast. Multiplanar CT image reconstructions and MIPs were obtained to evaluate the vascular anatomy. CONTRAST:  165mL OMNIPAQUE IOHEXOL 350 MG/ML SOLN COMPARISON:  CT angiogram chest May 16, 2019; chest radiograph June 28, 2019 FINDINGS: Cardiovascular: There is no demonstrable pulmonary embolus. There is no thoracic aortic aneurysm or dissection. There are occasional foci of calcification in the proximal major great vessels. Note that the right innominate and left common carotid arteries arise as a common trunk, an anatomic variant. There are foci of aortic atherosclerosis as well as foci of coronary artery calcification. There is no pericardial effusion or pericardial thickening. Mediastinum/Nodes: Thyroid appears unremarkable. There are scattered foci of lymph node calcification consistent with prior granulomatous disease. No adenopathy is evident. No esophageal lesions are appreciable. Flattening of the posterior mid and lower trachea is again noted, unchanged in appearance and suspicious for a degree of tracheomalacia. Lungs/Pleura: There is central peribronchial thickening. There are scattered areas of atelectatic change in both lower lobes. There may be a degree of superimposed airspace consolidation in these areas. There is a calcified granuloma in the superior segment left lower lobe, stable. On axial slice 33 series 6, there is a new area of focal ground-glass type opacity in the anterior segment of the left upper lobe measuring 1.2 x 0.8 cm. No pleural effusions are evident.  Upper Abdomen: 2-3 mm calculi are noted in the upper pole of the right kidney. There are calcified splenic granulomas. Visualized upper abdominal structures otherwise appear unremarkable. Musculoskeletal: There is postoperative change in the lower cervical region. There is degenerative change in the thoracic spine. There are no blastic or lytic bone lesions. No chest wall lesions evident. Review of the MIP images confirms the above findings. IMPRESSION: 1. No demonstrable pulmonary embolus. No thoracic aortic aneurysm or dissection. There are foci aortic atherosclerosis as well as great vessel and coronary artery calcifications. 2. Central bronchitis bilaterally. Areas of atelectatic change in each lower lobe with suspected superimposed foci of pneumonia in these areas. No pleural effusions. 3. Focal new area of ground-glass type opacity in the anterior segment of the left upper lobe. This area measures 1.2 x 0.8 cm. Initial follow-up with CT at 6-12 months is recommended to confirm persistence. If persistent, repeat CT is recommended every 2 years until 5 years of stability has been established. This recommendation follows the consensus statement: Guidelines for Management of Incidental Pulmonary Nodules Detected on CT Images: From the Fleischner Society 2017; Radiology 2017; 284:228-243. 4.  No appreciable adenopathy. 5. 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 6.  Nonobstructing calculi in upper pole right kidney. Aortic Atherosclerosis (ICD10-I70.0). Electronically Signed   By: Lowella Grip III M.D.   On: 07/03/2019 09:30   DG Chest Port 1 View  Result Date: 06/28/2019 CLINICAL DATA:  Shortness of breath EXAM: PORTABLE CHEST 1 VIEW COMPARISON:  October 18, 2018 FINDINGS: The heart size and mediastinal contours are within normal limits. Both lungs are clear. The visualized skeletal structures are unremarkable. IMPRESSION: No active disease. Electronically Signed   By: Prudencio Pair  M.D.   On: 06/28/2019 12:29  Assessment & Plan:   Asthma exacerbation - Clinically improved  - Treated for asthma exacerbation 12/10 as well as bronchitis vs pneumonia with Levaquin and prednisone taper  - Unable to afford inhalers. Symbicort switched to [facility] d/t ability to provide patient with samples  - Recommend he call his insurance company to see what ICS/LABA inhaler is formulary on plan and will then needs to submit patient assistance paper work   Abnormal CT of the chest - CTA 07/03/19 showed new focal area of ground-glass type opacity in the anterior segment of the left upper lobe. This area measures 1.2 x 0.8 cm - Needs repeat CT chest in 6-12 months  Tobacco abuse - Continues to actively smoke - Refer to lung cancer screening clinic and pharmacy for smoking cessation counseling    Martyn Ehrich, NP 07/19/2019

## 2019-07-18 DIAGNOSIS — J45901 Unspecified asthma with (acute) exacerbation: Secondary | ICD-10-CM | POA: Diagnosis not present

## 2019-07-18 DIAGNOSIS — M5136 Other intervertebral disc degeneration, lumbar region: Secondary | ICD-10-CM | POA: Diagnosis not present

## 2019-07-18 DIAGNOSIS — J189 Pneumonia, unspecified organism: Secondary | ICD-10-CM | POA: Diagnosis not present

## 2019-07-18 DIAGNOSIS — E1029 Type 1 diabetes mellitus with other diabetic kidney complication: Secondary | ICD-10-CM | POA: Diagnosis not present

## 2019-07-18 DIAGNOSIS — M545 Low back pain: Secondary | ICD-10-CM | POA: Diagnosis not present

## 2019-07-18 MED FILL — PENICILLIN VK 500 MG TABLET: 500 | 10 days supply | Qty: 30 | Fill #0

## 2019-07-18 MED FILL — CYCLOBENZAPRINE HCL 10 MG T: 10 | 20 days supply | Qty: 60 | Fill #0

## 2019-07-19 ENCOUNTER — Encounter: Payer: Self-pay | Admitting: Primary Care

## 2019-07-19 DIAGNOSIS — G40909 Epilepsy, unspecified, not intractable, without status epilepticus: Secondary | ICD-10-CM | POA: Insufficient documentation

## 2019-07-19 DIAGNOSIS — R9389 Abnormal findings on diagnostic imaging of other specified body structures: Secondary | ICD-10-CM | POA: Insufficient documentation

## 2019-07-19 NOTE — Assessment & Plan Note (Signed)
-   Clinically improved  - Treated for asthma exacerbation 12/10 as well as bronchitis vs pneumonia with Levaquin and prednisone taper  - Unable to afford inhalers. Symbicort switched to [facility] d/t ability to provide patient with samples  - Recommend he call his insurance company to see what ICS/LABA inhaler is formulary on plan and will then needs to submit patient assistance paper work

## 2019-07-19 NOTE — Assessment & Plan Note (Signed)
-   CTA 07/03/19 showed new focal area of ground-glass type opacity in the anterior segment of the left upper lobe. This area measures 1.2 x 0.8 cm - Needs repeat CT chest in 6-12 months

## 2019-07-19 NOTE — Assessment & Plan Note (Addendum)
-   Continues to actively smoke - Refer to lung cancer screening clinic and pharmacy for smoking cessation counseling

## 2019-07-23 ENCOUNTER — Other Ambulatory Visit: Payer: Self-pay | Admitting: *Deleted

## 2019-07-23 NOTE — Patient Outreach (Signed)
[facility]) Care Management  07/23/2019  Keanthony Rupp Linse [birth year] [id number]   Transition of care /Case Closure Unsuccessful outreach for follow up call     Referral received:07/02/19 Initial outreach:07/09/19 Insurance: [facility]   Unsuccessful follow up call  to initial transition of care call as agreed.   Objective: Mr. Torrealba hospitalized [facility] 12/10-12/20/20 for Acute Asthma exacerbationComorbidities include: Diabetes type 1 on insulin pump, Asthma, GERD, Hyperlipidemia. Hewas discharged to home on 12/20/20without the need for home health servicesor DME.   Plan Case closed to [employer] as it has been 10 days since initial post discharge transition of care call completed on 07/09/19.  Joylene Draft, RN, [place] Management Coordinator  [phone]- Mobile [phone]- Toll Free Main Office

## 2019-07-24 NOTE — Progress Notes (Unsigned)
Subjective:  Patient referred to pharmacy team for financial assistance and smoking cessation by [name], NP, on 07/17/2019.  Past medical history includesPast medical history includes chronic asthmatic bronchitis, allergic rhinitis, CAD, GERD, DM type 1 (on insulin pump), gastroparesis, seizures, anxiety/depression, and HLD. Per [facility] 06/25/2019 note "He ran out of his Symbicort 6 weeks ago, has been using Albuterol rescue inhaler 8-10x Arnette. States that he has not filled Symbicort prescription d/t Cost of medication which is approx $70/month." Patient has been switched from Symbicort to [facility] due to sample supply.  Frederik Schmidt, CPT, ran test claim for patient and determined the following information: 1. Advair Diskus- $5.00 copay (no copay card) 2. Advair HFA- $99.33 copay (no copay card) 3. Airduo Respiclick- NDC not covered  4. Symbicort- $67.92 copay (no copay card) 5. Dulera- $58.08 copay (Copay card: Eligible commercially insured patients may pay as little as $15 on each of up to 12 prescriptions for savings of up to $90 per month) 6. Breo Ellipta- $69.46 copay (no copay card)  Patient presents today for initial pharmacy appt.  Respiratory Medications Current: Dulera, albuterol nebs, albuterol inhaler prn Tried in past: Symbicort (cost), Breo Ellipta, Advair Diskus, Singulair Patient reports {Adherence challenges yes [id number] challenges","no known adherence challenges"}  Asthma Questionnaire Triggers: *** Coughing at night ***days/week Coughing with exertion *** Chest pain*** ER visits in last 6 months *** Hospitalizations *** Asthma Control (past 4 weeks)  Asthma gets in the way of finishing tasks (school, work, home): {How much time:3044014::"all of the time","most of the time","some of the time","a little of the time","none of the time"}  Shortness of breath: {How often:3044014::"more than once a [place]","once a [place]","3-6 times per week","once  or twice a week","not at all"}    Symptoms causing nighttime awakening: {How often:3044014::"4 or more nights/week","2-3 nights/week","once a week","1-2 nights/month","not at all"}   Frequency of rescue inhaler use: {How often:3044014::"3 or more times/[place]","1-2 times/[place]","2-3 times/week","once a week or less","not at all"}   Asthma control: {Rating:3044014::"not controlled","poor","somewhat","well","completely"}  Social History   Tobacco Use  Smoking Status Current Every Helinski Smoker  . Packs/Delfin: 0.00  . Types: Cigarettes  . Last attempt to quit: 02/09/2017  . Years since quitting: 2.4  Smokeless Tobacco Never Used  Tobacco Comment   resumed smoking in fall of 2017     Tobacco Use History  Age when started using tobacco on a daily basis ***.  Type: {Nicotine:3044014::"cigarettes","cigar","pipe","electronic cigarettes","snus"}.  Number of cigarettes per Arey ***, brand ***.  Smokes first cigarette *** minutes after waking.  {Does/does not:3044014::"Does","Does not"} wake at night to smoke  Triggers include {hx nicotine triggers:311123}.  Quit Attempt History   Most recent quit attempt ***.  Longest time ever been tobacco free ***.  Methods tried in the past include {CHL AMB [facility] MEDICATIONS FOR SMOKING CESSATION:20759}.   Rates IMPORTANCE of quitting tobacco on 1-10 scale of ***.  Rates READINESS of quitting tobacco on 1-10 scale of ***.  Rates CONFIDENCE of quitting tobacco on 1-10 scale of ***.  Motivators to quitting include ***; barriers include {smoking cessation barriers:18118}   Objective: Allergies  Allergen Reactions  . Augmentin [Amoxicillin-Pot Clavulanate] Nausea And Vomiting and Other (See Comments)    Did it involve swelling of the face/tongue/throat, SOB, or low BP? No Did it involve sudden or severe rash/hives, skin peeling, or any reaction on the inside of your mouth or nose? No Did you need to seek medical attention at a hospital or doctor's  office? No When did it last  happen?5+ years If all above answers are "NO", may proceed with cephalosporin use.   [name] [Escitalopram Oxalate] Itching    Outpatient Encounter Medications as of 07/30/2019  Medication Sig Note  . acetaminophen (TYLENOL) 500 MG tablet Take 1,000 mg by mouth every 6 (six) hours as needed for moderate pain or headache.   . albuterol (PROVENTIL) (2.5 MG/3ML) 0.083% nebulizer solution USE 1 VIAL VIA NEBULIZER EVERY 6 HOURS AS NEEDED FOR WHEEZING OR SHORTNESS OF BREATH (Patient taking differently: Take 2.5 mg by nebulization every 6 (six) hours as needed for wheezing or shortness of breath. )   . albuterol (VENTOLIN HFA) 108 (90 Base) MCG/ACT inhaler INHALE 2 PUFFS BY MOUTH INTO THE LUNGS EVERY 6 HOURS AS NEEDED FOR WHEEZING OR SHORTNESS OF BREATH. (Patient taking differently: Inhale 2 puffs into the lungs every 6 (six) hours as needed for wheezing or shortness of breath. )   . budesonide-formoterol (SYMBICORT) 160-4.5 MCG/ACT inhaler INHALE 2 PUFFS INTO THE LUNGS 2 (TWO) TIMES DAILY. (Patient not taking: Reported on 06/25/2019)   . fexofenadine (ALLEGRA) 180 MG tablet Take 180 mg by mouth daily.   [name] HYDROcodone-homatropine (HYCODAN) 5-1.5 MG/5ML syrup Take 5 mLs by mouth every 6 (six) hours as needed for cough. (Patient not taking: Reported on 07/17/2019)   . hydrOXYzine (VISTARIL) 25 MG capsule Take 1 capsule (25 mg total) by mouth every 4 (four) hours as needed for anxiety.   . insulin glargine (LANTUS) 100 UNIT/ML injection Inject 0.2 mLs (20 Units total) into the skin at bedtime. (Patient not taking: Reported on 07/17/2019)   . insulin lispro (HUMALOG) 100 UNIT/ML KwikPen Junior Inject 0.05 mLs (5 Units total) into the skin 3 (three) times daily before meals.   . insulin lispro (HUMALOG) 100 UNIT/ML KwikPen Junior Sliding scale: CBG 70 - 120: 0 units  CBG 121 - 150: 2 units  CBG 151 - 200: 3 units  CBG 201 - 250: 5 units  CBG 251 - 300: 8 units  CBG 301 -  350: 11 units  CBG 351 - 400: 15 units   . Insulin Pen Needle 31G X 5 MM MISC 1 Device by Does not apply route 4 (four) times daily. For use with insulin pens   . mometasone-formoterol (DULERA) 100-5 MCG/ACT AERO Inhale 2 puffs into the lungs 2 (two) times daily.   . ondansetron (ZOFRAN) 4 MG tablet Take 1 tablet (4 mg total) by mouth every 6 (six) hours as needed for nausea or vomiting.   [name] oxyCODONE-acetaminophen (PERCOCET) 5-325 MG tablet Take 1 tablet by mouth every 6 (six) hours as needed for severe pain.   . polyvinyl alcohol (LIQUIFILM TEARS) 1.4 % ophthalmic solution Place 1 drop into both eyes as needed for dry eyes.   . predniSONE (DELTASONE) 10 MG tablet Taper dose: 60mg  po daily x 3 days, then 40mg  po daily x 3 days, then 20mg  po daily x 3 days, then 10mg  po daily x 3 days, then 5mg  po daily x 3 days, then stop. Zero refills   . simvastatin (ZOCOR) 40 MG tablet Take 40 mg by mouth at bedtime.    . traZODone (DESYREL) 150 MG tablet Take 150 mg by mouth at bedtime.   . varenicline (CHANTIX) 1 MG tablet Take 1 mg by mouth 2 (two) times daily. 06/28/2019: Insurance won't pay until 07/2019. Ran out   No facility-administered encounter medications on file as of 07/30/2019.     Immunization History  Administered Date(s) Administered  . Influenza Split  04/18/2017, 03/28/2019  . Influenza Whole 07/31/2007, 04/15/2009, 05/08/2010  . Influenza,inj,Quad PF,6+ Mos 05/19/2016  . Influenza-Unspecified 03/19/2015  . Pneumococcal Polysaccharide-23 04/19/2003, 04/11/2019  . Td 12/22/2000  . Tdap 08/19/2013     PFTs 09/18/18- FEV1 2.56 (75), ratio 76- Mild restriction  CTA 07/03/19 - No evidence pulmonary embolus. Central bronchitis bilaterally. Areas of atelectatic change ineach lower lobe with suspected superimposed foci of pneumonia in these areas. No pleural effusions. Focal new area of ground-glass type opacity in the anterior segment of the left upper lobe. This area measures 1.2 x 0.8  cm.  Eosinophils Most recent blood eosinophil count was 0.0 cells/microL taken on 06/30/2019.   In-Check DIAL  Inspiratory flow measured using the In-check DIAL G16 and was in range for use of *** device. Patient scored ***.  Assessment and Plan  1. Inhaler Optimization (Advair Diskus)  2. Smoking Cessation -Initiated nicotine replacement tx with ***. Patient counseled on purpose, proper use, and potential adverse effects. Bupropion is contraindicated in this patient considering his history of seizures.   Nicotine Patch Patient counseled on purpose, proper use, and potential adverse effects, including mild itching or redness at the point of application, headache, trouble sleeping, and/or vivid dreams     Patch Schedule for >10 cigarettes daily -Weeks 1-6: one 21 mg patch daily -Weeks 7-8: one 14 mg patch daily -Weeks 9-10: one 7 mg patch daily  Patch Schedule for <10 cigarettes daily -Weeks 1 to 6: one nicotine patch (14 mg) daily. I will call and re-assess how you are doing at the end of 6 weeks to see how you are doing on 14 mg patch and if you are ready to decrease dose of patch. -Weeks 7-8: one nicotine patch (7 mg) daily  Nicotine Lozenge Patient counseled on purpose, proper use, and potential adverse effects including nausea, hiccups, cough, and heartburn.  Instructed patient to use  2 mg unless the smoke within 30 minutes of waking up in which they should use 4 mg.  Lozenge dosing schedule Weeks 1 to 6: 1 lozenge every 1 to 2 hours (maximum: 5 lozenges every 6 hours; 20 lozenges/D[place]); to increase chances of quitting, use at least 9 lozenges/Glasheen during the first 6 weeks   Weeks 7 to 9: 1 lozenge every 2 to 4 hours (maximum: 5 lozenges every 6 hours; 20 lozenges/Fallen)   Weeks 10 to 12: 1 lozenge every 4 to 8 hours (maximum: 5 lozenges every 6 hours; 20 lozenges/H[place]low)   Nicotine Gum Patient counseled on purpose, proper use, and potential adverse effects including jaw soreness  and upset stomach if swallowing saliva.  Instructed patient to use 4 mg if they smoke a pack a Yamaguchi or more and 2 mg if they smoke less than a pack a Rhames.  Gum dosing schedule Weeks 1 to 6: Chew 1 piece of gum every 1 to 2 hours (maximum: 24 pieces/Kealey); to increase chances of quitting, chew at least 9 pieces/Schrader during the first 6 weeks   Weeks 7 to 9: Chew 1 piece of gum every 2 to 4 hours (maximum: 24 pieces/Welle)   Weeks 10 to 12: Chew 1 piece of gum every 4 to 8 hours (maximum: 24 pieces/Olkowski)  Varenicline -Initiated varenicline titration of 0.5 mg by mouth once daily with food x3 days, then 0.5 mg by mouth twice daily with food x4 days, then 1 mg by mouth twice daily with food thereafter.  CrCL greater than 30 mL/min. Patient counseled on purpose, proper use, and potential  adverse effects, including GI upset, and potential change in mood.   -Provided information on 1 800-QUIT NOW support program.   3. Medication Reconciliation a. A drug regimen assessment was performed, including review of allergies, interactions, disease-state management, dosing and immunization history. Medications were reviewed with the patient, including name, instructions, indication, goals of therapy, potential side effects, importance of adherence, and safe use.  4. Immunizations a. Patient is indicated for the shingles vaccination.  Thank you for involving pharmacy to assist in providing Mr. Loscalzo's care.   Drexel Iha, PharmD PGY2 Ambulatory Care Pharmacy Resident

## 2019-07-25 DIAGNOSIS — E1065 Type 1 diabetes mellitus with hyperglycemia: Secondary | ICD-10-CM | POA: Diagnosis not present

## 2019-07-25 MED FILL — traZODone HCL 150 MG TABS: 150 | 90 days supply | Qty: 90 | Fill #0

## 2019-07-26 MED FILL — CHANTIX 1 MG CONT MONTH BOX: 1 | 28 days supply | Qty: 56 | Fill #0

## 2019-07-28 ENCOUNTER — Ambulatory Visit (INDEPENDENT_AMBULATORY_CARE_PROVIDER_SITE_OTHER)
Admission: RE | Admit: 2019-07-28 | Discharge: 2019-07-28 | Disposition: A | Discharge status: Home / Self Care | Payer: 59 | Source: Ambulatory Visit

## 2019-07-28 DIAGNOSIS — K0889 Other specified disorders of teeth and supporting structures: Secondary | ICD-10-CM | POA: Diagnosis not present

## 2019-07-28 MED ORDER — OXYCODONE-ACETAMINOPHEN 5-325 MG PO TABS: 1.0000 | ORAL_TABLET | Freq: Three times a day (TID) | ORAL | 0 refills | Status: DC | PRN

## 2019-07-28 NOTE — Discharge Instructions (Signed)
Follow up if developing fever, swelling, increased pain, neck stiffness

## 2019-07-28 NOTE — ED Provider Notes (Addendum)
Virtual Visit via Video Note:  Venus Moriarity Krinsky  initiated request for Telemedicine visit with [facility] Urgent Care team. I connected with Shellie Berenguer Lejeune  on 07/29/2019 at 9:09 AM  for a synchronized telemedicine visit using a video enabled HIPPA compliant telemedicine application. I verified that I am speaking with Joesph Fillers Mchaffie  using two identifiers. Wofford Stratton C Jaquelyne Firkus, PA-C  was physically located in a [facility] Urgent care site and Varick Zoto Blatter was located at a different location.   The limitations of evaluation and management by telemedicine as well as the availability of in-person appointments were discussed. Patient was informed that he  may incur a bill ( including co-pay) for this virtual visit encounter. Keland R Senne  expressed understanding and gave verbal consent to proceed with virtual visit.     History of Present Illness:James [name]  is a 64 y.o. male presents for evaluation of dental pain.  Patient states that over the past week he has had significantly worsening dental pain.  Notes that the pain is around #19.  He has plans to follow-up with an oral surgeon on Tuesday in 4 days.  He has started taking a course of penicillin which has not been helping with his symptoms.  He denies any significant swelling.  He was prescribed tramadol by his PCP, but states that this has not been helping.  He has also been trying to apply warm compresses.  He reports very poor sleep due to pain.  He is hoping to have a cancellation on Monday in order to be seen then instead of Tuesday.   Allergies  Allergen Reactions  . Augmentin [Amoxicillin-Pot Clavulanate] Nausea And Vomiting and Other (See Comments)    Did it involve swelling of the face/tongue/throat, SOB, or low BP? No Did it involve sudden or severe rash/hives, skin peeling, or any reaction on the inside of your mouth or nose? No Did you need to seek medical attention at a hospital or doctor's office? No When did it last happen?5+ years If all  above answers are "NO", may proceed with cephalosporin use.   [name] [Escitalopram Oxalate] Itching     Past Medical History:  Diagnosis Date  . ANXIETY 04/03/2007  . Anxiety   . ASTHMA 09/06/2008  . Asthma   . ASTHMATIC BRONCHITIS, ACUTE 10/25/2008  . Bladder neck obstruction   . CARPAL TUNNEL SYNDROME, BILATERAL 07/31/2007   issues resolved, no surgery  . Cervical disc disease   . Chronic bronchitis ([place])    "get it about q yr" (02/12/2014)  . COPD (chronic obstructive pulmonary disease) ([place])   . CORONARY ARTERY DISEASE 04/03/2007  . DEPRESSION 04/03/2007  . Depression   . DIABETES MELLITUS, TYPE I 04/03/2007  . Diabetic retinopathy associated with diabetes mellitus due to underlying condition ([place]) 04/03/2007  . DM W/EYE MANIFESTATIONS, TYPE I, UNCONTROLLED 04/04/2007  . 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 ED (erectile dysfunction)   . History of kidney stones   . HYPERLIPIDEMIA 04/04/2007  . Pneumonia    "several times and again today" (02/13/2104)  . Renal insufficiency   . Seizures ([place])    "insulin seizure from time to time; none in the last couple years" (02/12/2014)  . Spinal stenosis      Social History   Tobacco Use  . Smoking status: Current Every Junod Smoker    Packs/Bennick: 0.00    Types: Cigarettes    Last attempt to quit: 02/09/2017  Years since quitting: 2.4  . Smokeless tobacco: Never Used  . Tobacco comment: resumed smoking in fall of 2017  Substance Use Topics  . Alcohol use: No  . Drug use: No        Observations/Objective: Physical Exam  Constitutional: He is oriented to person, place, and time and well-developed, well-nourished, and in no distress.  Appears uncomfortable, holding heating pad to left jaw  HENT:  Head: Normocephalic and atraumatic.  No obvious swelling noted  Neck:  Full active range of motion of neck  Pulmonary/Chest: Effort normal. No respiratory distress.  Speaking in full sentences  Musculoskeletal:      Cervical back: Normal range of motion.  Neurological: He is alert and oriented to person, place, and time.  Speech clear, face symmetric     Assessment and Plan:    ICD-10-CM   1. Pain, dental  K08.89     Patient with dental pain, refractory to NSAIDs and tramadol.  Is already on a course of penicillin.  No reported signs of abscess at this time, will defer altering antibiotic therapy at this time.  We will have her complete course of penicillin.  Will provide 3 days worth of oxycodone as he has tolerated this previously.  Discussed using sparingly.  Follow-up with oral surgery as planned on Tuesday.  Registry was checked, benefit greater than risk.  Follow Up Instructions:     I discussed the assessment and treatment plan with the patient. The patient was provided an opportunity to ask questions and all were answered. The patient agreed with the plan and demonstrated an understanding of the instructions.   The patient was advised to call back or seek an in-person evaluation if the symptoms worsen or if the condition fails to improve as anticipated.      Janith Lima, PA-C  07/29/2019 9:09 AM         Janith Lima, PA-C 07/29/19 0910    Janith Lima, PA-C 07/29/19 [phone]

## 2019-07-31 MED FILL — HYDROCODON-APAP 5-325: 5-325 | 4 days supply | Qty: 8 | Fill #0

## 2019-07-31 MED FILL — CLINDAMYCIN HCL 150 MG CAPS: 150 | 7 days supply | Qty: 28 | Fill #0

## 2019-08-02 NOTE — Progress Notes (Unsigned)
Subjective:  Patient referred to pharmacy team for financial assistance and smoking cessation by [name], NP, on 07/17/2019.  Past medical history includesPast medical history includes chronic asthmatic bronchitis, allergic rhinitis, CAD, GERD, DM type 1 (on insulin pump), gastroparesis, seizures, anxiety/depression, and HLD. Per [facility] 06/25/2019 note "He ran out of his Symbicort 6 weeks ago, has been using Albuterol rescue inhaler 8-10x Stegmaier. States that he has not filled Symbicort prescription d/t Cost of medication which is approx $70/month." Patient has been switched from Symbicort to [facility] due to sample supply.  Frederik Schmidt, CPT, ran test claim for patient and determined the following information: 1. Advair Diskus- $5.00 copay (no copay card) 2. Advair HFA- $99.33 copay (no copay card) 3. Airduo Respiclick- NDC not covered  4. Symbicort- $67.92 copay (no copay card) 5. Dulera- $58.08 copay (Copay card: Eligible commercially insured patients may pay as little as $15 on each of up to 12 prescriptions for savings of up to $90 per month) 6. Breo Ellipta- $69.46 copay (no copay card)  Patient presents today for initial pharmacy appt.  Respiratory Medications Current: Dulera, albuterol nebs, albuterol inhaler prn Tried in past: Symbicort (cost), Breo Ellipta, Advair Diskus, Singulair Patient reports {Adherence challenges yes [id number] challenges","no known adherence challenges"}  Asthma Questionnaire Triggers: *** Coughing at night ***days/week Coughing with exertion *** Chest pain*** ER visits in last 6 months *** Hospitalizations *** Asthma Control (past 4 weeks)  Asthma gets in the way of finishing tasks (school, work, home): {How much time:3044014::"all of the time","most of the time","some of the time","a little of the time","none of the time"}  Shortness of breath: {How often:3044014::"more than once a Kuhrt","once a Poucher","3-6 times per  week","once or twice a week","not at all"}    Symptoms causing nighttime awakening: {How often:3044014::"4 or more nights/week","2-3 nights/week","once a week","1-2 nights/month","not at all"}   Frequency of rescue inhaler use: {How often:3044014::"3 or more times/Habermehl","1-2 times/Aubry","2-3 times/week","once a week or less","not at all"}   Asthma control: {Rating:3044014::"not controlled","poor","somewhat","well","completely"}   Objective: Allergies  Allergen Reactions  . Augmentin [Amoxicillin-Pot Clavulanate] Nausea And Vomiting and Other (See Comments)    Did it involve swelling of the face/tongue/throat, SOB, or low BP? No Did it involve sudden or severe rash/hives, skin peeling, or any reaction on the inside of your mouth or nose? No Did you need to seek medical attention at a hospital or doctor's office? No When did it last happen?5+ years If all above answers are "NO", may proceed with cephalosporin use.   [name] [Escitalopram Oxalate] Itching   Social History       Tobacco Use  Smoking Status Current Every Lebow Smoker  . Packs/Delvecchio: 0.00  . Types: Cigarettes  . Last attempt to quit: 02/09/2017  . Years since quitting: 2.4  Smokeless Tobacco Never Used  Tobacco Comment   resumed smoking in fall of 2017     Tobacco Use History  Age when started using tobacco on a daily basis ***.  Type: {Nicotine:3044014::"cigarettes","cigar","pipe","electronic cigarettes","snus"}.  Number of cigarettes per Garis ***, brand ***.  Smokes first cigarette *** minutes after waking.  {Does/does not:3044014::"Does","Does not"} wake at night to smoke  Triggers include {hx nicotine triggers:311123}.  Quit Attempt History   Most recent quit attempt ***.  Longest time ever been tobacco free ***.  Methods tried in the past include {CHL AMB [facility] MEDICATIONS FOR SMOKING CESSATION:20759}.   Rates IMPORTANCE of quitting tobacco on 1-10 scale of ***.  Rates READINESS of quitting  tobacco on 1-10  scale of ***.  Rates CONFIDENCE of quitting tobacco on 1-10 scale of ***.  Motivators to quitting include ***; barriers include {smoking cessation barriers:18118}    Outpatient Encounter Medications as of 08/06/2019  Medication Sig Note  . acetaminophen (TYLENOL) 500 MG tablet Take 1,000 mg by mouth every 6 (six) hours as needed for moderate pain or headache.   . albuterol (PROVENTIL) (2.5 MG/3ML) 0.083% nebulizer solution USE 1 VIAL VIA NEBULIZER EVERY 6 HOURS AS NEEDED FOR WHEEZING OR SHORTNESS OF BREATH (Patient taking differently: Take 2.5 mg by nebulization every 6 (six) hours as needed for wheezing or shortness of breath. )   . albuterol (VENTOLIN HFA) 108 (90 Base) MCG/ACT inhaler INHALE 2 PUFFS BY MOUTH INTO THE LUNGS EVERY 6 HOURS AS NEEDED FOR WHEEZING OR SHORTNESS OF BREATH. (Patient taking differently: Inhale 2 puffs into the lungs every 6 (six) hours as needed for wheezing or shortness of breath. )   . budesonide-formoterol (SYMBICORT) 160-4.5 MCG/ACT inhaler INHALE 2 PUFFS INTO THE LUNGS 2 (TWO) TIMES DAILY. (Patient not taking: Reported on 06/25/2019)   . fexofenadine (ALLEGRA) 180 MG tablet Take 180 mg by mouth daily.   . hydrOXYzine (VISTARIL) 25 MG capsule Take 1 capsule (25 mg total) by mouth every 4 (four) hours as needed for anxiety.   . insulin glargine (LANTUS) 100 UNIT/ML injection Inject 0.2 mLs (20 Units total) into the skin at bedtime. (Patient not taking: Reported on 07/17/2019)   . insulin lispro (HUMALOG) 100 UNIT/ML KwikPen Junior Inject 0.05 mLs (5 Units total) into the skin 3 (three) times daily before meals.   . insulin lispro (HUMALOG) 100 UNIT/ML KwikPen Junior Sliding scale: CBG 70 - 120: 0 units  CBG 121 - 150: 2 units  CBG 151 - 200: 3 units  CBG 201 - 250: 5 units  CBG 251 - 300: 8 units  CBG 301 - 350: 11 units  CBG 351 - 400: 15 units   . Insulin Pen Needle 31G X 5 MM MISC 1 Device by Does not apply route 4 (four) times daily. For use  with insulin pens   . mometasone-formoterol (DULERA) 100-5 MCG/ACT AERO Inhale 2 puffs into the lungs 2 (two) times daily.   . ondansetron (ZOFRAN) 4 MG tablet Take 1 tablet (4 mg total) by mouth every 6 (six) hours as needed for nausea or vomiting.   [name] oxyCODONE-acetaminophen (PERCOCET/ROXICET) 5-325 MG tablet Take 1-2 tablets by mouth every 8 (eight) hours as needed for severe pain.   . polyvinyl alcohol (LIQUIFILM TEARS) 1.4 % ophthalmic solution Place 1 drop into both eyes as needed for dry eyes.   . predniSONE (DELTASONE) 10 MG tablet Taper dose: 60mg  po daily x 3 days, then 40mg  po daily x 3 days, then 20mg  po daily x 3 days, then 10mg  po daily x 3 days, then 5mg  po daily x 3 days, then stop. Zero refills   . simvastatin (ZOCOR) 40 MG tablet Take 40 mg by mouth at bedtime.    . traZODone (DESYREL) 150 MG tablet Take 150 mg by mouth at bedtime.   . varenicline (CHANTIX) 1 MG tablet Take 1 mg by mouth 2 (two) times daily. 06/28/2019: Insurance won't pay until 07/2019. Ran out   No facility-administered encounter medications on file as of 08/06/2019.     Immunization History  Administered Date(s) Administered  . Influenza Split 04/18/2017, 03/28/2019  . Influenza Whole 07/31/2007, 04/15/2009, 05/08/2010  . Influenza,inj,Quad PF,6+ Mos 05/19/2016  . Influenza-Unspecified 03/19/2015  . Pneumococcal  Polysaccharide-23 04/19/2003, 04/11/2019  . Td 12/22/2000  . Tdap 08/19/2013     PFTs 09/18/18- FEV1 2.56 (75), ratio 76- Mild restriction  CTA 07/03/19 - No evidence pulmonary embolus.Central bronchitis bilaterally. Areas of atelectatic change ineach lower lobe with suspected superimposed foci of pneumonia in these areas. No pleural effusions. Focal new area of ground-glass type opacity in the anterior segment of the left upper lobe. This area measures 1.2 x 0.8 cm.  Eosinophils Most recent blood eosinophil count was 0.0 cells/microL taken on 06/30/2019.   In-Check DIAL  Inspiratory  flow measured using the In-check DIAL G16 and was in range for use of *** device. Patient scored ***.  Assessment and Plan  1. Inhaler Optimization (Advair Diskus)  2. Smoking Cessation -Initiated nicotine replacement tx with ***. Patient counseled on purpose, proper use, and potential adverse effects. Bupropion is contraindicated in this patient considering his history of seizures.   Nicotine Patch Patient counseled on purpose, proper use, and potential adverse effects, includingmild itching or redness at the point of application, headache, trouble sleeping, and/or vivid dreams   Patch Schedule for >10 cigarettes daily -Weeks 1-6: one 21 mg patch daily -Weeks 7-8: one 14 mg patch daily -Weeks 9-10: one 7 mg patch daily  Patch Schedule for <10 cigarettes daily -Weeks 1 to 6:one nicotine patch (14 mg) daily.I will call and re-assess how you are doing at the end of 6 weeks to see how you are doing on 14 mg patch and if you are ready to decrease dose of patch. -Weeks 7-8: one nicotine patch (7 mg) daily  Nicotine Lozenge Patient counseled on purpose, proper use, and potential adverse effects includingnausea, hiccups, cough, and heartburn.  Instructed patient to use  2 mg unless the smoke within 30 minutes of waking up in which they should use 4 mg.  Lozenge dosing schedule Weeks 1 to 6: 1 lozenge every 1 to 2 hours (maximum: 5 lozenges every 6 hours; 20 lozenges/[place]); to increase chances of quitting, use at least 9 lozenges/Maready during the first 6 weeks  Weeks 7 to 9: 1 lozenge every 2 to 4 hours (maximum: 5 lozenges every 6 hours; 20 lozenges/Younkin)  Weeks 10 to 12: 1 lozenge every 4 to 8 hours (maximum: 5 lozenges every 6 hours; 20 lozenges/Ochsner)  Nicotine Gum Patient counseled on purpose, proper use, and potential adverse effects includingjaw soreness and upset stomach if swallowing saliva.  Instructed patient to use 4 mg if they smoke a pack a Ishaq or more and 2 mg if  they smoke less than a pack a Ehle.  Gum dosing schedule Weeks 1 to 6: Chew 1 piece of gum every 1 to 2 hours (maximum: 24 pieces/Mensing); to increase chances of quitting, chew at least 9 pieces/Naff during the first 6 weeks  Weeks 7 to 9: Chew 1 piece of gum every 2 to 4 hours (maximum: 24 pieces/Authier)  Weeks 10 to 12: Chew 1 piece of gum every 4 to 8 hours (maximum: 24 pieces/Matney)  Varenicline -Initiated varenicline titration of 0.5 mg by mouth once daily with food x3 days, then 0.5 mg by mouth twice daily with food x4 days, then 1 mg by mouth twice daily with food thereafter.  CrCL greater than 30 mL/min. Patient counseled on purpose, proper use, and potential adverse effects, including GI upset, and potential change in mood.   -Provided information on 1 800-QUIT NOW support program.   3. Medication Reconciliation a. A drug regimen assessment was performed, including review of  allergies, interactions, disease-state management, dosing and immunization history. Medications were reviewed with the patient, including name, instructions, indication, goals of therapy, potential side effects, importance of adherence, and safe use.  4. Immunizations a. Patient is indicated for the shingles vaccination.  Thank you for involving pharmacy to assist in providing Mr. Corniel's care.        Drexel Iha, PharmD PGY2 Ambulatory Care Pharmacy Resident

## 2019-08-03 MED FILL — PROMETHAZINE 25 MG TABLET: 25 | 2 days supply | Qty: 5 | Fill #0

## 2019-08-03 MED FILL — traMADol HCL 50 MG TABS: 50 | 3 days supply | Qty: 10 | Fill #0

## 2019-08-06 NOTE — Progress Notes (Signed)
Subjective:  Patientreferred topharmacy team for financial assistance and smoking cessation by James Pitter, NP, on 07/17/2019. Past medical history includesPast medical history includes chronic asthmatic bronchitis, allergic rhinitis, CAD, GERD, DM type 1 (on insulin pump), gastroparesis, seizures, anxiety/depression, and HLD.Per James [name] 06/25/2019 note "He ran out of his Symbicort 6 weeks ago, has been using Albuterol rescue inhaler 8-10x Berenguer. States that he has not filled Symbicort prescription d/t Cost of medication which is approx $70/month." Patient has been switched from Symbicort to [facility] due to sample supply.  James [name], CPT, ran test claim for patient and determined the following information: 1. Advair Diskus- $5.00 copay (no copay card) 2. Advair HFA- $99.33 copay (no copay card) 3. Airduo Respiclick- NDC not covered  4. Symbicort- $67.92 copay (no copay card) 5. Dulera- $58.08 copay (Copay card:Eligible commercially insured patients may pay as little as $15 on each of up to 12 prescriptions for savings of up to $90 per month) 6. Breo Ellipta- $69.46 copay (no copay card) 7. Nicotine patch 21 mg daily - $0 copay 8. Nicotine lozenge 2 mg daily - $0 copay  Patient presents today for initial pharmacy appt. Patient reports his breathing is well overall. He was prescribed Chantix in the past - worked well and successfully quit. He is unsure of the reason why he stopped using Breo Ellipta, Advair Diskus, and/or Singulair.  Respiratory Medications Current:Dulera,albuterol nebs (uses 1x per Kroh), albuterol inhaler prn (used 5-6 weeks ago) Tried in past:Symbicort (cost),Breo Ellipta, Advair Diskus, Singulair Patient reports forgetting to take [facility] every 1-2 days  Asthma Questionnaire Triggers: exercise Coughing at night 0 days/week Coughing with moderate-intensity exercise Chest pain: denies  ER visits in last 6 months: 1  Hospitalizations: 1 (~2  months ago) Asthma Control (past 4 weeks)             Asthma gets in the way of finishing tasks (school, work, home):  Some of the time             Shortness of breath: once a Gomm              Symptoms causing nighttime awakening: not at all             Frequency of rescue inhaler use: none             Asthma control: well  Objective:      Allergies  Allergen Reactions  . Augmentin [Amoxicillin-Pot Clavulanate] Nausea And Vomiting and Other (See Comments)    Did it involve swelling of the face/tongue/throat, SOB, or low BP? No Did it involve sudden or severe rash/hives, skin peeling, or any reaction on the inside of your mouth or nose? No Did you need to seek medical attention at a hospital or doctor's office? No When did it last happen?5+ years If all above answers are "NO", may proceed with cephalosporin use.   James [name] [Escitalopram Oxalate] Itching   Social History       Tobacco Use  Smoking Status Current Every Val[place] Smoker  . Packs/Schrodt: 0.00  . Types: Cigarettes  . Last attempt to quit: 02/09/2017  . Years since quitting: 2.4  Smokeless Tobacco Never Used  Tobacco Comment   resumed smoking in fall of 2017    Tobacco Use History  Age when started using tobacco on a daily basis 16  Type: cigarettes  Number of cigarettes per [place] 10-15, brand [place] Menthol  Smokes first cigarette 60-90 minutes after waking.  Does not wake  at night to smoke  Triggers include boredom, stress, meals, sometimes coffee  Quit Attempt History  Most recent quit attempt 3 years (re-started 8-12 months ago)  Longest time ever been tobacco free 2 years  Methods tried in the past include Chantix   Rates IMPORTANCE of quitting tobacco on 1-10 scale of 9-10  Rates READINESS of quitting tobacco on 1-10 scale of 4  Rates CONFIDENCE of quitting tobacco on 1-10 scale of 3   Motivators to quitting include health; barriers include boredom        Outpatient  Encounter Medications as of 08/06/2019  Medication Sig Note  . acetaminophen (TYLENOL) 500 MG tablet Take 1,000 mg by mouth every 6 (six) hours as needed for moderate pain or headache.   . albuterol (PROVENTIL) (2.5 MG/3ML) 0.083% nebulizer solution USE 1 VIAL VIA NEBULIZER EVERY 6 HOURS AS NEEDED FOR WHEEZING OR SHORTNESS OF BREATH (Patient taking differently: Take 2.5 mg by nebulization every 6 (six) hours as needed for wheezing or shortness of breath. )   . albuterol (VENTOLIN HFA) 108 (90 Base) MCG/ACT inhaler INHALE 2 PUFFS BY MOUTH INTO THE LUNGS EVERY 6 HOURS AS NEEDED FOR WHEEZING OR SHORTNESS OF BREATH. (Patient taking differently: Inhale 2 puffs into the lungs every 6 (six) hours as needed for wheezing or shortness of breath. )   . budesonide-formoterol (SYMBICORT) 160-4.5 MCG/ACT inhaler INHALE 2 PUFFS INTO THE LUNGS 2 (TWO) TIMES DAILY. (Patient not taking: Reported on 06/25/2019)   . fexofenadine (ALLEGRA) 180 MG tablet Take 180 mg by mouth daily.   . hydrOXYzine (VISTARIL) 25 MG capsule Take 1 capsule (25 mg total) by mouth every 4 (four) hours as needed for anxiety.   . insulin glargine (LANTUS) 100 UNIT/ML injection Inject 0.2 mLs (20 Units total) into the skin at bedtime. (Patient not taking: Reported on 07/17/2019)   . insulin lispro (HUMALOG) 100 UNIT/ML KwikPen Junior Inject 0.05 mLs (5 Units total) into the skin 3 (three) times daily before meals.   . insulin lispro (HUMALOG) 100 UNIT/ML KwikPen Junior Sliding scale: CBG 70 - 120:         0 units     CBG 121 - 150:       2 units     CBG 151 - 200:       3 units     CBG 201 - 250:       5 units     CBG 251 - 300:       8 units     CBG 301 - 350:       11 units     CBG 351 - 400:       15 units   . Insulin Pen Needle 31G X 5 MM MISC 1 Device by Does not apply route 4 (four) times daily. For use with insulin pens   . mometasone-formoterol (DULERA) 100-5 MCG/ACT AERO Inhale 2 puffs into the lungs 2 (two) times daily.   .  ondansetron (ZOFRAN) 4 MG tablet Take 1 tablet (4 mg total) by mouth every 6 (six) hours as needed for nausea or vomiting.   [name] oxyCODONE-acetaminophen (PERCOCET/ROXICET) 5-325 MG tablet Take 1-2 tablets by mouth every 8 (eight) hours as needed for severe pain.   . polyvinyl alcohol (LIQUIFILM TEARS) 1.4 % ophthalmic solution Place 1 drop into both eyes as needed for dry eyes.   . predniSONE (DELTASONE) 10 MG tablet Taper dose: 60mg  po daily x 3 days, then 40mg  po daily x  3 days, then 20mg  po daily x 3 days, then 10mg  po daily x 3 days, then 5mg  po daily x 3 days, then stop. Zero refills   . simvastatin (ZOCOR) 40 MG tablet Take 40 mg by mouth at bedtime.    . traZODone (DESYREL) 150 MG tablet Take 150 mg by mouth at bedtime.   . varenicline (CHANTIX) 1 MG tablet Take 1 mg by mouth 2 (two) times daily. 06/28/2019: Insurance won't pay until 07/2019. Ran out   No facility-administered encounter medications on file as of 08/06/2019.         Immunization History  Administered Date(s) Administered  . Influenza Split 04/18/2017, 03/28/2019  . Influenza Whole 07/31/2007, 04/15/2009, 05/08/2010  . Influenza,inj,Quad PF,6+ Mos 05/19/2016  . Influenza-Unspecified 03/19/2015  . Pneumococcal Polysaccharide-23 04/19/2003, 04/11/2019  . Td 12/22/2000  . Tdap 08/19/2013     PFTs 09/18/18- FEV1 2.56 (75), ratio 76- Mild restriction  CTA 07/03/19-No evidence pulmonary embolus.Central bronchitis bilaterally. Areas of atelectatic change ineach lower lobe with suspected superimposed foci of pneumonia in these areas. No pleural effusions. Focal new area of ground-glass type opacity in the anterior segment of the left upper lobe. This area measures 1.2 x 0.8 cm.  Eosinophils Most recent blood eosinophil count was0.0cells/microL taken on 06/30/2019.   In-Check DIAL  Inspiratory flow measured using the In-check DIAL G16 and was in range 30-90 for use of diskus device. Patient scored  23.  Assessment and Plan  1. Inhaler Optimization(Advair Diskus) Based on patient's insurance copay the most affordable option would be Advair Diskus. Asked patient if he had a bad rxn since it has been on his medication list previously - patient confirms he did not. He does not remember why he stopped it; he likely attributes stopping to cost. He is will to retry Advair Diskus. Counseled patient thoroughly on drug name, dose, administration, and side effects.  Instructed patient to rinse mouth with water after using in order to prevent fungal infection. Patient watched a video on how to administer Advair Diskus properly and was able to demonstrate appropriate technique when using demo inhaler. Patient also was able to use the In-Check DIAL to learn how to take an adequate breath with DPI inhaler (at first was breathing too strongly and was able to slow down his breath after use with In-Check DIAL tool). Reviewed appropriate use of maintenance vs rescue inhalers.  Stressed importance of using maintenance inhaler daily and rescue inhaler only as needed. Patient will start using Advair Diskus tomorrow (08/14/2019).  Patient verbalized understanding. Sent Advair Diskus 100/50 rx to [facility].    2. Smoking Cessation -Initiated nicotine replacement tx with Chantix,, nicotine patch 21 mg daily, and nicotine lozenge 2 mg. Patient counseled on purpose, proper use, and potential adverse effects.Patient would prefer to be contacted in 5 weeks to give himself adequate time to try smoking cessation indepedently.  Non-Pharmacologic: Gum, fishing, taking a walk, gardening, cleaning, wash car, read a book  Pharmacologic:   Nicotine Patch (21 mg) Patient counseled on purpose, proper use, and potential adverse effects, includingmild itching or redness at the point of application, headache, trouble sleeping, and/or vivid dreams. Patient verbalized understanding. Sent nicortine patch rx to [facility].    Patch Schedule for >10 cigarettes daily -Weeks 1-6: one 21 mg patch daily -Weeks 7-8: one 14 mg patch daily -Weeks 9-10: one 7 mg patch daily  Nicotine Lozenge (2 mg) Patient counseled on purpose, proper use, and potential adverse effects includingnausea, hiccups,  cough, and heartburn. Instructed patient to use 2 mg unless the smoke within 30 minutes of waking up in which they should use 4 mg. Patient verbalized understanding. Sent nicortine lozenge rx to [facility].  Lozenge dosing schedule Weeks 1 to 6: 1 lozenge every 1 to 2 hours (maximum: 5 lozenges every 6 hours; 20 lozenges/Carriere); to increase chances of quitting, use at least 9 lozenges/Oberman during the first 6 weeks  Weeks 7 to 9: 1 lozenge every 2 to 4 hours (maximum: 5 lozenges every 6 hours; 20 lozenges/Gipson)  Weeks 10 to 12: 1 lozenge every 4 to 8 hours (maximum: 5 lozenges every 6 hours; 20 lozenges/Beaudin)  Varenicline -Initiated varenicline titration of 0.5 mg by mouth once daily with food x3 days, then 0.5 mg by mouth twice daily with food x4 days, then 1 mg by mouth twice daily with food thereafter. CrCL greater than 30 mL/min. Patient counseled on purpose, proper use, and potential adverse effects, including GI upset, and potential change in mood. Patient verbalized understanding.Patient verbalized understanding. Sent Chantix rx to [facility].  -Provided information on 1 800-QUIT NOW support program.Patient is interested in reaching out to [employer].  3. Medication Reconciliation a. A drug regimen assessment was performed, including review of allergies, interactions, disease-state management, dosing and immunization history. Medications were reviewed with the patient, including name, instructions, indication, goals of therapy, potential side effects, importance of adherence, and safe use. b. Discontinued 1. Symbicort - change in therapy (Advair Diskus) 2. Dulera - change in therapy  (Advair Diskus) 3. Prednisone - completed course   4. Immunizations a. Patient is indicated and interested in receiving the shingles vaccination. Sent rx in to [facility] pharmacy. Patient verbalized understanding.  Thank you for involving pharmacy to assist in providingMr. [name]'scare.  Drexel Iha, PharmD PGY2 Ambulatory Care Pharmacy Resident

## 2019-08-13 ENCOUNTER — Other Ambulatory Visit: Payer: Self-pay

## 2019-08-13 ENCOUNTER — Ambulatory Visit (INDEPENDENT_AMBULATORY_CARE_PROVIDER_SITE_OTHER): Payer: 59 | Admitting: Pharmacist

## 2019-08-13 DIAGNOSIS — Z720 Tobacco use: Secondary | ICD-10-CM | POA: Diagnosis not present

## 2019-08-13 DIAGNOSIS — Z0000 Encounter for general adult medical examination without abnormal findings: Secondary | ICD-10-CM

## 2019-08-13 DIAGNOSIS — J45998 Other asthma: Secondary | ICD-10-CM

## 2019-08-13 MED ORDER — FLUTICASONE-SALMETEROL 100-50 MCG/DOSE IN AEPB: 1.0000 | INHALATION_SPRAY | Freq: Two times a day (BID) | RESPIRATORY_TRACT | 11 refills | Status: DC

## 2019-08-13 MED ORDER — CHANTIX STARTING MONTH PAK 0.5 MG X 11 & 1 MG X 42 PO TABS: ORAL_TABLET | ORAL | 0 refills | Status: DC

## 2019-08-13 MED ORDER — NICOTINE 21 MG/24HR TD PT24: 21.0000 mg | MEDICATED_PATCH | Freq: Every day | TRANSDERMAL | 1 refills | Status: DC

## 2019-08-13 MED ORDER — NICOTINE POLACRILEX 2 MG MT LOZG: 2.0000 mg | LOZENGE | OROMUCOSAL | 1 refills | Status: DC | PRN

## 2019-08-13 MED ORDER — ZOSTER VAC RECOMB ADJUVANTED 50 MCG/0.5ML IM SUSR: 0.5000 mL | Freq: Once | INTRAMUSCULAR | 1 refills | Status: AC

## 2019-08-13 MED FILL — ADVAIR 100/50 DISKUS: 100-50 | 30 days supply | Qty: 60 | Fill #0

## 2019-08-13 MED FILL — CHANTIX STARTING MONTH BOX: 0.5 MG X 11 | 28 days supply | Qty: 53 | Fill #0

## 2019-08-13 MED FILL — NICOTINE 21 MG/24HR PATCH: 21 | 28 days supply | Qty: 28 | Fill #0

## 2019-08-13 NOTE — Patient Instructions (Addendum)
It was a pleasure seeing you in clinic today Mr. Encalade!  Today the plan is... 1. STOP Dulera 2. START Advair Diskus 1 puff twice daily tomorrow (08/14/19). Remember to rinse your mouth out. 3. START using Chantix starter pack, nicotine 21 mg daily, nicotine lozenge 2 mg (up to 9 lozenges per Krebbs) 4. Call [place] [facility] 1-800-QUITNOW. 5. Stanton Kidney (pharmacist) will call you in about 5 weeks  Please call the PharmD clinic at [phone] if you have any questions that you would like to speak with a pharmacist about Stanton Kidney, Museum/gallery conservator).

## 2019-08-28 DIAGNOSIS — E1065 Type 1 diabetes mellitus with hyperglycemia: Secondary | ICD-10-CM | POA: Diagnosis not present

## 2019-09-07 MED FILL — SIMVASTATIN 40 MG TABLET: 40 | 30 days supply | Qty: 30 | Fill #2

## 2019-09-14 MED FILL — CONTOUR NEXT STRIPS: 75 days supply | Qty: 600 | Fill #7

## 2019-09-17 ENCOUNTER — Telehealth: Payer: Self-pay | Admitting: Pharmacist

## 2019-09-17 ENCOUNTER — Encounter: Payer: Self-pay | Admitting: Pharmacist

## 2019-09-17 ENCOUNTER — Other Ambulatory Visit: Payer: Self-pay | Admitting: Primary Care

## 2019-09-17 DIAGNOSIS — J449 Chronic obstructive pulmonary disease, unspecified: Secondary | ICD-10-CM

## 2019-09-17 DIAGNOSIS — Z720 Tobacco use: Secondary | ICD-10-CM

## 2019-09-17 MED ORDER — VARENICLINE TARTRATE 1 MG PO TABS: 1.0000 mg | ORAL_TABLET | Freq: Two times a day (BID) | ORAL | 5 refills | Status: DC

## 2019-09-17 MED ORDER — ZOSTER VAC RECOMB ADJUVANTED 50 MCG/0.5ML IM SUSR: 0.5000 mL | Freq: Once | INTRAMUSCULAR | 1 refills | Status: AC

## 2019-09-17 NOTE — Telephone Encounter (Signed)
Called patient on 09/17/2019 at 4:46 PM   Patient has successfully quit smoking! Encouraged patient for his success!!!! States he had his last cigarette about 4 weeks ago. He continues to use Chantix (toleraitng medication without any side effects). Informed patient he can continue Chantix for now.   Sent in Shingles rx to [facility]. Patient verbalized understanding.  Thank you for involving pharmacy to assist in providing this patient's care.   Drexel Iha, PharmD PGY2 Ambulatory Care Pharmacy Resident

## 2019-09-18 MED FILL — CHANTIX 1 MG CONT MONTH BOX: 1 | 28 days supply | Qty: 56 | Fill #0

## 2019-09-18 NOTE — Telephone Encounter (Signed)
Awesome news, thanks so much!

## 2019-09-24 MED FILL — SHINGRIX 50 MCG SUS: 50 | 1 days supply | Qty: 1 | Fill #0

## 2019-10-09 DIAGNOSIS — F17200 Nicotine dependence, unspecified, uncomplicated: Secondary | ICD-10-CM | POA: Diagnosis not present

## 2019-10-09 DIAGNOSIS — E1029 Type 1 diabetes mellitus with other diabetic kidney complication: Secondary | ICD-10-CM | POA: Diagnosis not present

## 2019-10-09 DIAGNOSIS — Z4681 Encounter for fitting and adjustment of insulin pump: Secondary | ICD-10-CM | POA: Diagnosis not present

## 2019-10-09 DIAGNOSIS — N08 Glomerular disorders in diseases classified elsewhere: Secondary | ICD-10-CM | POA: Diagnosis not present

## 2019-10-09 DIAGNOSIS — Z794 Long term (current) use of insulin: Secondary | ICD-10-CM | POA: Diagnosis not present

## 2019-10-09 MED FILL — SIMVASTATIN 40 MG TABLET: 40 | 30 days supply | Qty: 30 | Fill #0

## 2019-10-17 MED FILL — CHANTIX 1 MG CONT MONTH BOX: 1 | 28 days supply | Qty: 56 | Fill #0

## 2019-10-17 MED FILL — traZODone HCL 150 MG TABS: 150 | 90 days supply | Qty: 90 | Fill #1

## 2019-10-22 ENCOUNTER — Telehealth: Payer: Self-pay | Admitting: Pulmonary Disease

## 2019-10-22 MED ORDER — PREDNISONE 10 MG PO TABS: ORAL_TABLET | ORAL | 0 refills | Status: DC

## 2019-10-22 MED ORDER — AZITHROMYCIN 250 MG PO TABS: ORAL_TABLET | ORAL | 0 refills | Status: DC

## 2019-10-22 MED FILL — AZITHROMYCIN 250 MG TABLET: 250 | 5 days supply | Qty: 6 | Fill #0

## 2019-10-22 MED FILL — predniSONE 10 MG TABS: 10 | 12 days supply | Qty: 30 | Fill #0

## 2019-10-22 NOTE — Telephone Encounter (Signed)
I'll send in prednisone taper and abx for presumed asthma/bronchitis exacerbation. Take mucinex twice daily. If acutely worse present to ED. Follow-up in 1 week in office if he has had covid Vaccine.

## 2019-10-22 NOTE — Telephone Encounter (Signed)
Spoke with pt, he is having more SOB for the last week or two. He feels like it needs to be evaluated. He is not on oxygen and denies fever. He does have some cough but not much mucus coming up. He doesn't have allergies but thinks it may be why he is so SOB. He has not been using his inhalers regularly because he thought his proventil was advair and hasn't used the Advair as a maintenance. He doesn't feel the difference when he uses the [facility] or Advair and doesn't think his SOB is coming from not using the Advair. He quit smoking 3-4 months ago and thought he would get better and not worse. He would like to be seen but there are no appointments available today. Please advise.

## 2019-10-22 NOTE — Telephone Encounter (Signed)
Called pt and advised message from the provider. Pt understood and verbalized understanding. Nothing further is needed.   He has an appt on 10/29/2019 at 4:30 since he is having his 2nd vaccine on Wednesday.

## 2019-10-23 DIAGNOSIS — E1065 Type 1 diabetes mellitus with hyperglycemia: Secondary | ICD-10-CM | POA: Diagnosis not present

## 2019-10-25 ENCOUNTER — Telehealth: Payer: Self-pay | Admitting: Primary Care

## 2019-10-25 ENCOUNTER — Other Ambulatory Visit: Payer: Self-pay | Admitting: Primary Care

## 2019-10-25 NOTE — Telephone Encounter (Signed)
Called and spoke with pt. Pt is requesting to have his hydrocodone cough syrup refilled. Pt does have an upcoming appt scheduled with Beth 4/12 for a follow up for bronchitis.  Beth, please advise if you are okay refilling med for pt. Preferred pharmacy is [facility] I have pended the med.

## 2019-10-25 NOTE — Telephone Encounter (Signed)
Not much to do right no unfortunately. I would not have recommended getting vaccine while acutely ill. Worsening symptoms likely side effect from vaccine. Continue antibiotics. Mucinex twice daily, tylenol prn fever/chills/aches. ED if acutely worse. Push oral fluids.

## 2019-10-25 NOTE — Telephone Encounter (Signed)
Add saline/ocean nasal rinse

## 2019-10-25 NOTE — Telephone Encounter (Signed)
Please route to EW who has seen the patient before and is scheduled to see the patient on 10/29/19.   Aaron Edelman

## 2019-10-25 NOTE — Telephone Encounter (Signed)
Spoke with pt and advised of recommendations per Derl Barrow, NP. PT verbalized understanding. Nothing further needed at this time.

## 2019-10-25 NOTE — Telephone Encounter (Signed)
Spoke with pt. He states he is feeling no better. Zpak and Pred taper were called in on 10/22/19. Pt starts 3 tabs/Rottman today of Prednisone. Pt still c/o nose burning and running, cough prod with greenish tint, SOB and wheezing, some body aches. Denies fever or loss of taste or smell. PT received second Covid vaccine yesterday evening. These symptoms have been going on for at least a week now. No available appts that dont conflict with another appt pt has today. Please advise.

## 2019-10-26 ENCOUNTER — Encounter (HOSPITAL_COMMUNITY): Payer: Self-pay

## 2019-10-26 ENCOUNTER — Inpatient Hospital Stay (HOSPITAL_COMMUNITY)
Admission: EM | Admit: 2019-10-26 | Discharge: 2019-11-01 | DRG: 202 | Disposition: A | Discharge status: Home / Self Care | Payer: 59 | Attending: Internal Medicine | Admitting: Internal Medicine

## 2019-10-26 ENCOUNTER — Encounter (HOSPITAL_COMMUNITY): Payer: Self-pay | Admitting: Emergency Medicine

## 2019-10-26 ENCOUNTER — Other Ambulatory Visit: Payer: Self-pay

## 2019-10-26 ENCOUNTER — Emergency Department (HOSPITAL_COMMUNITY): Payer: 59

## 2019-10-26 ENCOUNTER — Emergency Department (HOSPITAL_COMMUNITY)
Admission: EM | Admit: 2019-10-26 | Discharge: 2019-10-26 | Disposition: A | Discharge status: Home / Self Care | Payer: 59 | Source: Home / Self Care | Attending: Emergency Medicine | Admitting: Emergency Medicine

## 2019-10-26 DIAGNOSIS — E109 Type 1 diabetes mellitus without complications: Secondary | ICD-10-CM | POA: Diagnosis present

## 2019-10-26 DIAGNOSIS — Z720 Tobacco use: Secondary | ICD-10-CM | POA: Diagnosis present

## 2019-10-26 DIAGNOSIS — R05 Cough: Secondary | ICD-10-CM | POA: Diagnosis not present

## 2019-10-26 DIAGNOSIS — J80 Acute respiratory distress syndrome: Secondary | ICD-10-CM | POA: Diagnosis not present

## 2019-10-26 DIAGNOSIS — J441 Chronic obstructive pulmonary disease with (acute) exacerbation: Secondary | ICD-10-CM | POA: Diagnosis present

## 2019-10-26 DIAGNOSIS — Z7951 Long term (current) use of inhaled steroids: Secondary | ICD-10-CM | POA: Diagnosis not present

## 2019-10-26 DIAGNOSIS — J9601 Acute respiratory failure with hypoxia: Secondary | ICD-10-CM | POA: Diagnosis present

## 2019-10-26 DIAGNOSIS — E785 Hyperlipidemia, unspecified: Secondary | ICD-10-CM | POA: Diagnosis not present

## 2019-10-26 DIAGNOSIS — Z880 Allergy status to penicillin: Secondary | ICD-10-CM

## 2019-10-26 DIAGNOSIS — R944 Abnormal results of kidney function studies: Secondary | ICD-10-CM | POA: Diagnosis present

## 2019-10-26 DIAGNOSIS — Z87891 Personal history of nicotine dependence: Secondary | ICD-10-CM

## 2019-10-26 DIAGNOSIS — K219 Gastro-esophageal reflux disease without esophagitis: Secondary | ICD-10-CM | POA: Diagnosis present

## 2019-10-26 DIAGNOSIS — Z9841 Cataract extraction status, right eye: Secondary | ICD-10-CM | POA: Diagnosis not present

## 2019-10-26 DIAGNOSIS — J4551 Severe persistent asthma with (acute) exacerbation: Secondary | ICD-10-CM | POA: Diagnosis not present

## 2019-10-26 DIAGNOSIS — I2510 Atherosclerotic heart disease of native coronary artery without angina pectoris: Secondary | ICD-10-CM | POA: Diagnosis present

## 2019-10-26 DIAGNOSIS — Z881 Allergy status to other antibiotic agents status: Secondary | ICD-10-CM

## 2019-10-26 DIAGNOSIS — Z20822 Contact with and (suspected) exposure to covid-19: Secondary | ICD-10-CM | POA: Diagnosis not present

## 2019-10-26 DIAGNOSIS — Z981 Arthrodesis status: Secondary | ICD-10-CM

## 2019-10-26 DIAGNOSIS — J45901 Unspecified asthma with (acute) exacerbation: Secondary | ICD-10-CM | POA: Diagnosis present

## 2019-10-26 DIAGNOSIS — Z888 Allergy status to other drugs, medicaments and biological substances status: Secondary | ICD-10-CM

## 2019-10-26 DIAGNOSIS — R799 Abnormal finding of blood chemistry, unspecified: Secondary | ICD-10-CM | POA: Diagnosis not present

## 2019-10-26 DIAGNOSIS — R059 Cough, unspecified: Secondary | ICD-10-CM | POA: Diagnosis present

## 2019-10-26 DIAGNOSIS — Z8249 Family history of ischemic heart disease and other diseases of the circulatory system: Secondary | ICD-10-CM | POA: Diagnosis not present

## 2019-10-26 DIAGNOSIS — R0902 Hypoxemia: Secondary | ICD-10-CM

## 2019-10-26 DIAGNOSIS — Z7952 Long term (current) use of systemic steroids: Secondary | ICD-10-CM | POA: Diagnosis not present

## 2019-10-26 DIAGNOSIS — R0602 Shortness of breath: Secondary | ICD-10-CM | POA: Insufficient documentation

## 2019-10-26 DIAGNOSIS — Z794 Long term (current) use of insulin: Secondary | ICD-10-CM | POA: Diagnosis not present

## 2019-10-26 DIAGNOSIS — Z9842 Cataract extraction status, left eye: Secondary | ICD-10-CM

## 2019-10-26 DIAGNOSIS — K449 Diaphragmatic hernia without obstruction or gangrene: Secondary | ICD-10-CM | POA: Diagnosis not present

## 2019-10-26 DIAGNOSIS — F329 Major depressive disorder, single episode, unspecified: Secondary | ICD-10-CM | POA: Diagnosis present

## 2019-10-26 DIAGNOSIS — Z961 Presence of intraocular lens: Secondary | ICD-10-CM | POA: Diagnosis present

## 2019-10-26 DIAGNOSIS — R0600 Dyspnea, unspecified: Secondary | ICD-10-CM | POA: Diagnosis not present

## 2019-10-26 DIAGNOSIS — F17210 Nicotine dependence, cigarettes, uncomplicated: Secondary | ICD-10-CM | POA: Diagnosis present

## 2019-10-26 DIAGNOSIS — Z87442 Personal history of urinary calculi: Secondary | ICD-10-CM | POA: Diagnosis not present

## 2019-10-26 DIAGNOSIS — Z6825 Body mass index (BMI) 25.0-25.9, adult: Secondary | ICD-10-CM

## 2019-10-26 DIAGNOSIS — Z79899 Other long term (current) drug therapy: Secondary | ICD-10-CM | POA: Diagnosis not present

## 2019-10-26 DIAGNOSIS — E663 Overweight: Secondary | ICD-10-CM | POA: Diagnosis present

## 2019-10-26 DIAGNOSIS — J449 Chronic obstructive pulmonary disease, unspecified: Secondary | ICD-10-CM | POA: Diagnosis not present

## 2019-10-26 DIAGNOSIS — E1069 Type 1 diabetes mellitus with other specified complication: Secondary | ICD-10-CM | POA: Diagnosis not present

## 2019-10-26 DIAGNOSIS — J4541 Moderate persistent asthma with (acute) exacerbation: Secondary | ICD-10-CM | POA: Diagnosis not present

## 2019-10-26 DIAGNOSIS — J4489 Other specified chronic obstructive pulmonary disease: Secondary | ICD-10-CM | POA: Diagnosis present

## 2019-10-26 DIAGNOSIS — Z5321 Procedure and treatment not carried out due to patient leaving prior to being seen by health care provider: Secondary | ICD-10-CM | POA: Insufficient documentation

## 2019-10-26 DIAGNOSIS — R062 Wheezing: Secondary | ICD-10-CM | POA: Diagnosis not present

## 2019-10-26 DIAGNOSIS — F419 Anxiety disorder, unspecified: Secondary | ICD-10-CM | POA: Diagnosis present

## 2019-10-26 DIAGNOSIS — I2583 Coronary atherosclerosis due to lipid rich plaque: Secondary | ICD-10-CM | POA: Diagnosis not present

## 2019-10-26 LAB — GLUCOSE, CAPILLARY
Glucose-Capillary: 153 mg/dL — ABNORMAL HIGH (ref 70–99)
Glucose-Capillary: 195 mg/dL — ABNORMAL HIGH (ref 70–99)

## 2019-10-26 LAB — BASIC METABOLIC PANEL
Anion gap: 8 (ref 5–15)
BUN: 16 mg/dL (ref 8–23)
CO2: 23 mmol/L (ref 22–32)
Calcium: 8.4 mg/dL — ABNORMAL LOW (ref 8.9–10.3)
Chloride: 107 mmol/L (ref 98–111)
Creatinine, Ser: 0.7 mg/dL (ref 0.61–1.24)
GFR calc Af Amer: >60 mL/min (ref >60)
GFR calc non Af Amer: >60 mL/min (ref >60)
Glucose, Bld: 223 mg/dL — ABNORMAL HIGH (ref 70–99)
Potassium: 3.5 mmol/L (ref 3.5–5.1)
Sodium: 138 mmol/L (ref 135–145)

## 2019-10-26 LAB — CBC WITH DIFFERENTIAL/PLATELET
Abs Immature Granulocytes: 0 10*3/uL (ref 0.00–0.07)
Basophils Absolute: 0.1 10*3/uL (ref 0.0–0.1)
Basophils Relative: 1 %
Eosinophils Absolute: 0.6 10*3/uL — ABNORMAL HIGH (ref 0.0–0.5)
Eosinophils Relative: 11 %
HCT: 38.2 % — ABNORMAL LOW (ref 39.0–52.0)
Hemoglobin: 13.5 g/dL (ref 13.0–17.0)
Immature Granulocytes: 0 %
Lymphocytes Relative: 9 %
Lymphs Abs: 0.5 10*3/uL — ABNORMAL LOW (ref 0.7–4.0)
MCH: 32.8 pg (ref 26.0–34.0)
MCHC: 35.3 g/dL (ref 30.0–36.0)
MCV: 92.9 fL (ref 80.0–100.0)
Monocytes Absolute: 0.5 10*3/uL (ref 0.1–1.0)
Monocytes Relative: 9 %
Neutro Abs: 4.1 10*3/uL (ref 1.7–7.7)
Neutrophils Relative %: 70 %
Platelets: 233 10*3/uL (ref 150–400)
RBC: 4.11 MIL/uL — ABNORMAL LOW (ref 4.22–5.81)
RDW: 12.4 % (ref 11.5–15.5)
WBC: 5.8 10*3/uL (ref 4.0–10.5)
nRBC: 0 % (ref 0.0–0.2)

## 2019-10-26 LAB — CBG MONITORING, ED: Glucose-Capillary: 106 mg/dL — ABNORMAL HIGH (ref 70–99)

## 2019-10-26 LAB — RESPIRATORY PANEL BY RT PCR (FLU A&B, COVID)
Influenza A by PCR: NEGATIVE
Influenza B by PCR: NEGATIVE
SARS Coronavirus 2 by RT PCR: NEGATIVE

## 2019-10-26 MED ORDER — ONDANSETRON HCL 4 MG PO TABS: 4.0000 mg | ORAL_TABLET | Freq: Four times a day (QID) | ORAL | Status: DC | PRN

## 2019-10-26 MED ORDER — TRAZODONE HCL 50 MG PO TABS
150.0000 mg | ORAL_TABLET | Freq: Every day | ORAL | Status: DC
  Administered 2019-10-26 – 2019-10-31 (×6): 150 mg via ORAL
  Filled 2019-10-26 (×6): qty 3

## 2019-10-26 MED ORDER — LORATADINE 10 MG PO TABS
10.0000 mg | ORAL_TABLET | Freq: Every day | ORAL | Status: DC
  Administered 2019-10-26 – 2019-11-01 (×7): 10 mg via ORAL
  Filled 2019-10-26 (×7): qty 1

## 2019-10-26 MED ORDER — NICOTINE POLACRILEX 2 MG MT LOZG: 2.0000 mg | LOZENGE | OROMUCOSAL | Status: DC | PRN

## 2019-10-26 MED ORDER — INSULIN ASPART 100 UNIT/ML ~~LOC~~ SOLN
0.0000 [IU] | Freq: Three times a day (TID) | SUBCUTANEOUS | Status: DC
  Filled 2019-10-26: qty 0.15

## 2019-10-26 MED ORDER — FLUTICASONE FUROATE-VILANTEROL 100-25 MCG/INH IN AEPB: 1.0000 | INHALATION_SPRAY | Freq: Every day | RESPIRATORY_TRACT | Status: DC

## 2019-10-26 MED ORDER — ALBUTEROL SULFATE (2.5 MG/3ML) 0.083% IN NEBU
2.5000 mg | INHALATION_SOLUTION | Freq: Four times a day (QID) | RESPIRATORY_TRACT | Status: DC | PRN
  Administered 2019-10-26 – 2019-10-28 (×3): 2.5 mg via RESPIRATORY_TRACT
  Filled 2019-10-26 (×3): qty 3

## 2019-10-26 MED ORDER — AZITHROMYCIN 250 MG PO TABS
250.0000 mg | ORAL_TABLET | Freq: Every day | ORAL | Status: AC
  Administered 2019-10-26 – 2019-10-27 (×2): 250 mg via ORAL
  Filled 2019-10-26 (×2): qty 1

## 2019-10-26 MED ORDER — METHYLPREDNISOLONE SODIUM SUCC 125 MG IJ SOLR
125.0000 mg | Freq: Once | INTRAMUSCULAR | Status: AC
  Administered 2019-10-26: 125 mg via INTRAVENOUS
  Filled 2019-10-26: qty 2

## 2019-10-26 MED ORDER — ACETAMINOPHEN 325 MG PO TABS: 650.0000 mg | ORAL_TABLET | Freq: Four times a day (QID) | ORAL | Status: DC | PRN

## 2019-10-26 MED ORDER — ASPIRIN 81 MG PO CHEW
81.0000 mg | CHEWABLE_TABLET | Freq: Every day | ORAL | Status: DC
  Administered 2019-10-26 – 2019-11-01 (×7): 81 mg via ORAL
  Filled 2019-10-26 (×7): qty 1

## 2019-10-26 MED ORDER — POLYVINYL ALCOHOL 1.4 % OP SOLN
1.0000 [drp] | OPHTHALMIC | Status: DC | PRN
  Filled 2019-10-26: qty 15

## 2019-10-26 MED ORDER — INSULIN ASPART 100 UNIT/ML ~~LOC~~ SOLN
0.0000 [IU] | Freq: Every day | SUBCUTANEOUS | Status: DC
  Filled 2019-10-26: qty 0.05

## 2019-10-26 MED ORDER — HYDROCODONE-HOMATROPINE 5-1.5 MG/5ML PO SYRP: 5.0000 mL | ORAL_SOLUTION | Freq: Four times a day (QID) | ORAL | 0 refills | Status: DC | PRN

## 2019-10-26 MED ORDER — SIMVASTATIN 40 MG PO TABS
40.0000 mg | ORAL_TABLET | Freq: Every day | ORAL | Status: DC
  Administered 2019-10-26 – 2019-10-31 (×6): 40 mg via ORAL
  Filled 2019-10-26 (×6): qty 1

## 2019-10-26 MED ORDER — VARENICLINE TARTRATE 1 MG PO TABS
1.0000 mg | ORAL_TABLET | Freq: Two times a day (BID) | ORAL | Status: DC
  Administered 2019-10-26 – 2019-11-01 (×12): 1 mg via ORAL
  Filled 2019-10-26 (×14): qty 1

## 2019-10-26 MED ORDER — ALBUTEROL SULFATE HFA 108 (90 BASE) MCG/ACT IN AERS
1.0000 | INHALATION_SPRAY | RESPIRATORY_TRACT | Status: DC | PRN
  Administered 2019-10-26 – 2019-10-28 (×3): 2 via RESPIRATORY_TRACT
  Filled 2019-10-26: qty 6.7

## 2019-10-26 MED ORDER — PREDNISONE 20 MG PO TABS
40.0000 mg | ORAL_TABLET | Freq: Every day | ORAL | Status: DC
  Administered 2019-10-27: 40 mg via ORAL
  Filled 2019-10-26: qty 2

## 2019-10-26 MED ORDER — ENOXAPARIN SODIUM 40 MG/0.4ML ~~LOC~~ SOLN
40.0000 mg | SUBCUTANEOUS | Status: DC
  Administered 2019-10-26 – 2019-10-31 (×6): 40 mg via SUBCUTANEOUS
  Filled 2019-10-26 (×6): qty 0.4

## 2019-10-26 MED ORDER — FLUTICASONE FUROATE-VILANTEROL 100-25 MCG/INH IN AEPB
1.0000 | INHALATION_SPRAY | Freq: Every day | RESPIRATORY_TRACT | Status: DC
  Administered 2019-10-27: 1 via RESPIRATORY_TRACT
  Filled 2019-10-26: qty 28

## 2019-10-26 MED ORDER — NICOTINE POLACRILEX 2 MG MT GUM
2.0000 mg | CHEWING_GUM | OROMUCOSAL | Status: DC | PRN
  Administered 2019-10-31: 2 mg via ORAL
  Filled 2019-10-26 (×2): qty 1

## 2019-10-26 MED ORDER — ALBUTEROL SULFATE (2.5 MG/3ML) 0.083% IN NEBU
2.5000 mg | INHALATION_SOLUTION | Freq: Three times a day (TID) | RESPIRATORY_TRACT | Status: DC
  Administered 2019-10-27: 2.5 mg via RESPIRATORY_TRACT
  Filled 2019-10-26: qty 3

## 2019-10-26 MED FILL — HYDROCODONE-HOMATROPINE SOL: 5-1.5 | 12 days supply | Qty: 240 | Fill #0

## 2019-10-26 NOTE — Progress Notes (Signed)
Received report on behalf of the receiving nurse.

## 2019-10-26 NOTE — ED Notes (Signed)
Pt name called in lobby, no answer x3

## 2019-10-26 NOTE — Progress Notes (Signed)
Inpatient Diabetes Program Recommendations  AACE/ADA: New Consensus Statement on Inpatient Glycemic Control (2015)  Target Ranges:  Prepandial:   less than 140 mg/dL      Peak postprandial:   less than 180 mg/dL (1-2 hours)      Critically ill patients:  140 - 180 mg/dL   Lab Results  Component Value Date   GLUCAP 106 (H) 10/26/2019   HGBA1C 6.9 (H) 07/06/2019    Review of Glycemic Control  Diabetes history: DM1 Outpatient Diabetes medications: Insulin pump Current orders for Inpatient glycemic control: Novolog 0-15 units tidwc and hs  Secure text to RN asking if pt has insulin pump on. RN states he does have it on. Asked her to let MD know that he needs insulin pump order set to be placed and will need to d/c SQ insulin.  Excellent glycemic control with HgbA1C of 6.9%. Dr Forde Dandy is Endo.Pump settings Basal rate  12A                  1.5 units/hour  2A                   1.4 units/hour  7A                   1.7 units/hour  2P                   1.15 units/hour  6P                   1.4 units/hour  Total of 34.9 units of basal insulin in 24 hours  Carbohydrate coverage: 1 unit for very 8 grams of carbs Sensitivity: 1 unit drops glucose 25 points Target glucose: 120 mg/dl  Inpatient Diabetes Program Recommendations:    If blood sugars begin to rise with steroids, may want to have pt take insulin pump off Auto mode and turn to Manual mode and will need to do temporary basal of 125%.  (This was Endo's recommendation when pt was on steroids in the past)   Will follow.  Thank you. Lorenda Peck, RD, LDN, CDE Inpatient Diabetes Coordinator [phone] .

## 2019-10-26 NOTE — H&P (Signed)
History and Physical    James [name] [id number] DOB: 11/03/1955 DOA: 10/26/2019  PCP: [name], MD   Patient coming from: Home  I have personally briefly reviewed patient's old medical records in [place]  Chief Complaint: Asthma/COPD exac  HPI: James [name] is a 64 y.o. male with medical history significant of asthma, CAD, T1DM, HLD, GERD, and Depression who presents with worsening shortness of breath.  Patient reports symptom onset Monday with worsening by Tuesday.  He reports he called and to pulmonology and he did not have any appointments but did prescribe him prednisone with taper as well as azithromycin until a potential follow-up appointment on Monday.  He states despite this he has had progressive symptoms, predominantly shortness of breath and dyspnea on exertion.  He has had a sore throat and productive cough which was more prominent earlier in the course and now is mostly dry with occasional clear sputum production.  He denies any fevers or chills, has chest pain only after serious coughing spells.  He states his coughing spells can be quite severe.  He states he is no longer smoking.  He is on Chantix.  He has been utilizing his inhalers more frequently, several times a Diel.  No nausea, vomiting or diarrhea.  No loss of smell or taste.  No rashes.  He lives with his wife.  At baseline is independent in his ADLs.    Review of Systems: As per HPI otherwise 10 point review of systems negative.    Past Medical History:  Diagnosis Date  . ANXIETY 04/03/2007  . Anxiety   . ASTHMA 09/06/2008  . Asthma   . ASTHMATIC BRONCHITIS, ACUTE 10/25/2008  . Bladder neck obstruction   . CARPAL TUNNEL SYNDROME, BILATERAL 07/31/2007   issues resolved, no surgery  . Cervical disc disease   . Chronic bronchitis ([place])    "get it about q yr" (02/12/2014)  . COPD (chronic obstructive pulmonary disease) ([place])   . CORONARY ARTERY DISEASE 04/03/2007  . DEPRESSION 04/03/2007  . Depression    . DIABETES MELLITUS, TYPE I 04/03/2007  . Diabetic retinopathy associated with diabetes mellitus due to underlying condition ([place]) 04/03/2007  . DM W/EYE MANIFESTATIONS, TYPE I, UNCONTROLLED 04/04/2007  . 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 ED (erectile dysfunction)   . History of kidney stones   . HYPERLIPIDEMIA 04/04/2007  . Pneumonia    "several times and again today" (02/13/2104)  . Renal insufficiency   . Seizures ([place])    "insulin seizure from time to time; none in the last couple years" (02/12/2014)  . Spinal stenosis     Past Surgical History:  Procedure Laterality Date  . ANTERIOR CERVICAL DECOMP/DISCECTOMY FUSION  2000   "couple screws and a plate"  . ANTERIOR CERVICAL DECOMP/DISCECTOMY FUSION N/A 06/24/2016   Procedure: ANTERIOR CERVICAL DECOMPRESSION/DISCECTOMY FUSION CERVICAL FOUR - CERVICAL FIVE, CERVICAL FIVE - CERVICAL SIX; REMOVAL TETHER CERVICAL PLATE;  Surgeon: Jovita Gamma, MD;  Location: [facility];  Service: Neurosurgery;  Laterality: N/A;  ANTERIOR CERVICAL DECOMPRESSION/DISCECTOMY FUSION CERVICAL FOUR - CERVICAL FIVE, CERVICAL FIVE - CERVICAL SIX; REMOVAL TETHER CERVICAL PLATE  . APPENDECTOMY    . BACK SURGERY    . CARDIAC CATHETERIZATION  1990's  . CATARACT EXTRACTION W/ INTRAOCULAR LENS  IMPLANT, BILATERAL Bilateral   . CYSTOSCOPY WITH RETROGRADE PYELOGRAM, URETEROSCOPY AND STENT PLACEMENT Bilateral 04/06/2013   Procedure: BILATERAL CYSTOSCOPY WITH RETROGRADE PYELOGRAMS, STENT PLACEMENTS AND LEFT URETEROSCOPY AND STONE REMOVAL;  Surgeon: Alexis Frock,  MD;  Location: [facility];  Service: Urology;  Laterality: Bilateral;  . CYSTOSCOPY WITH STENT PLACEMENT Right 04/12/2013   Procedure: CYSTOSCOPY WITH STENT PLACEMENT;  Surgeon: Alexis Frock, MD;  Location: [facility];  Service: Urology;  Laterality: Right;  . CYSTOSCOPY/RETROGRADE/URETEROSCOPY Bilateral 04/12/2013   Procedure: CYSTOSCOPY/RETROGRADE/URETEROSCOPY;  Surgeon: Alexis Frock, MD;  Location: [facility];   Service: Urology;  Laterality: Bilateral;  RIGHT RETROGRADE   . HOLMIUM LASER APPLICATION Left [id number]   Procedure: HOLMIUM LASER APPLICATION;  Surgeon: Alexis Frock, MD;  Location: [facility];  Service: Urology;  Laterality: Left;  . LUMBAR LAMINECTOMY/DECOMPRESSION MICRODISCECTOMY N/A 04/20/2019   Procedure: Lumbar microdisectomy and decompression L5-S1 left;  Surgeon: Latanya Maudlin, MD;  Location: [facility];  Service: Orthopedics;  Laterality: N/A;  32min  . LYMPH NODE DISSECTION  ~ 1960   groin  . stress cardiolite  09/06/2002  . TONSILLECTOMY    . VITRECTOMY Bilateral      reports that he quit smoking about 2 months ago. His smoking use included cigarettes. He smoked 0.00 packs per Nielson. He has never used smokeless tobacco. He reports that he does not drink alcohol or use drugs.  Allergies  Allergen Reactions  . Augmentin [Amoxicillin-Pot Clavulanate] Nausea And Vomiting and Other (See Comments)    Did it involve swelling of the face/tongue/throat, SOB, or low BP? No Did it involve sudden or severe rash/hives, skin peeling, or any reaction on the inside of your mouth or nose? No Did you need to seek medical attention at a hospital or doctor's office? No When did it last happen?5+ years If all above answers are "NO", may proceed with cephalosporin use.   [name] [Escitalopram Oxalate] Itching    Family History  Problem Relation Age of Onset  . Stroke Father        strong FH cerebrovascular disease  . Diabetes Brother   . Heart disease Brother        CHF  . Cancer Mother   . Colon cancer Neg Hx     Prior to Admission medications   Medication Sig Start Date End Date Taking? Authorizing Provider  acetaminophen (TYLENOL) 500 MG tablet Take 1,000 mg by mouth every 6 (six) hours as needed for moderate pain or headache.    [provider]  albuterol (PROVENTIL) (2.5 MG/3ML) 0.083% nebulizer solution USE 1 VIAL VIA NEBULIZER EVERY 6 HOURS AS NEEDED FOR WHEEZING OR  SHORTNESS OF BREATH Patient taking differently: Take 2.5 mg by nebulization every 6 (six) hours as needed for wheezing or shortness of breath.  06/26/19   Rigoberto Noel, MD  albuterol (VENTOLIN HFA) 108 (90 Base) MCG/ACT inhaler INHALE 2 PUFFS BY MOUTH INTO THE LUNGS EVERY 6 HOURS AS NEEDED FOR WHEEZING OR SHORTNESS OF BREATH. Patient not taking: No sig reported 06/11/19   Melvenia Needles, NP  azithromycin (ZITHROMAX) 250 MG tablet Zpack taper as directed 10/22/19   Martyn Ehrich, NP  fexofenadine (ALLEGRA) 180 MG tablet Take 180 mg by mouth daily.    [provider]  Fluticasone-Salmeterol (ADVAIR DISKUS) 100-50 MCG/DOSE AEPB Inhale 1 puff into the lungs 2 (two) times daily. 08/13/19   Martyn Ehrich, NP  hydrOXYzine (VISTARIL) 25 MG capsule Take 1 capsule (25 mg total) by mouth every 4 (four) hours as needed for anxiety. Patient not taking: Reported on 08/13/2019 07/08/19   Donne Hazel, MD  insulin lispro (HUMALOG) 100 UNIT/ML KwikPen Junior Inject 0.05 mLs (5 Units total) into the skin 3 (three) times  daily before meals. 07/08/19   Donne Hazel, MD  insulin lispro (HUMALOG) 100 UNIT/ML KwikPen Junior Sliding scale: CBG 70 - 120: 0 units  CBG 121 - 150: 2 units  CBG 151 - 200: 3 units  CBG 201 - 250: 5 units  CBG 251 - 300: 8 units  CBG 301 - 350: 11 units  CBG 351 - 400: 15 units 07/08/19   Donne Hazel, MD  Insulin Pen Needle 31G X 5 MM MISC 1 Device by Does not apply route 4 (four) times daily. For use with insulin pens 07/08/19   Donne Hazel, MD  nicotine (NICODERM CQ - DOSED IN MG/24 HOURS) 21 mg/24hr patch Place 1 patch (21 mg total) onto the skin daily. 08/13/19   Martyn Ehrich, NP  nicotine polacrilex (NICOTINE MINI) 2 MG lozenge Take 1 lozenge (2 mg total) by mouth as needed for smoking cessation. 08/13/19   Martyn Ehrich, NP  ondansetron (ZOFRAN) 4 MG tablet Take 1 tablet (4 mg total) by mouth every 6 (six) hours as needed for nausea or vomiting.  04/20/19   Constable, Amber, PA-C  polyvinyl alcohol (LIQUIFILM TEARS) 1.4 % ophthalmic solution Place 1 drop into both eyes as needed for dry eyes.    [provider]  predniSONE (DELTASONE) 10 MG tablet Take 4 tabs po daily x 3 days; then 3 tabs daily x3 days; then 2 tabs daily x3 days; then 1 tab daily x 3 days; then stop 10/22/19   Martyn Ehrich, NP  simvastatin (ZOCOR) 40 MG tablet Take 40 mg by mouth at bedtime.     [provider]  traZODone (DESYREL) 150 MG tablet Take 150 mg by mouth at bedtime.    [provider]  varenicline (CHANTIX CONTINUING MONTH PAK) 1 MG tablet Take 1 tablet (1 mg total) by mouth 2 (two) times daily. 09/17/19   Martyn Ehrich, NP    Physical Exam: Vitals:   10/26/19 0946 10/26/19 0949 10/26/19 0950  BP:   (!) 144/75  Pulse:   94  Resp:   (!) 21  Temp:   99.2 F (37.3 C)  TempSrc:   Oral  SpO2: 100% 93% 93%    Vitals:   10/26/19 0946 10/26/19 0949 10/26/19 0950  BP:   (!) 144/75  Pulse:   94  Resp:   (!) 21  Temp:   99.2 F (37.3 C)  TempSrc:   Oral  SpO2: 100% 93% 93%     Constitutional: NAD, appears comfortable Eyes: PERRL, lids and conjunctivae normal ENMT: Mucous membranes are moist. Posterior pharynx clear of any exudate or lesions.Normal dentition.  Neck: normal, supple, no masses, no thyromegaly Respiratory: Poor expansion, posteriorly notable rhonchi, anteriorly diffuse wheezing bilaterally Cardiovascular: Regular rate and rhythm, no murmurs / rubs / gallops. No extremity edema. 2+ pedal pulses. No carotid bruits.  Abdomen: no tenderness, no masses palpated. No hepatosplenomegaly. Bowel sounds positive.  Musculoskeletal: no clubbing / cyanosis. No joint deformity upper and lower extremities. Good ROM, no contractures. Normal muscle tone.  Skin: no rashes, lesions, ulcers. No induration Neurologic: CN 2-12 grossly intact. Sensation and strength grossly intact Psychiatric: Normal judgment and insight.  Alert and oriented x 3. Normal mood.   Labs on Admission: I have personally reviewed following labs and imaging studies  CBC: Recent Labs  Lab 10/26/19 1019  WBC 5.8  NEUTROABS 4.1  HGB 13.5  HCT 38.2*  MCV 92.9  PLT 0000000   Basic Metabolic Panel:  Recent Labs  Lab 10/26/19 1019  NA 138  K 3.5  CL 107  CO2 23  GLUCOSE 223*  BUN 16  CREATININE 0.70  CALCIUM 8.4*   GFR: Estimated Creatinine Clearance: 93.3 mL/min (by C-G formula based on SCr of 0.7 mg/dL). Liver Function Tests: No results for input(s): AST, ALT, ALKPHOS, BILITOT, PROT, ALBUMIN in the last 168 hours. No results for input(s): LIPASE, AMYLASE in the last 168 hours. No results for input(s): AMMONIA in the last 168 hours. Coagulation Profile: No results for input(s): INR, PROTIME in the last 168 hours. Cardiac Enzymes: No results for input(s): CKTOTAL, CKMB, CKMBINDEX, TROPONINI in the last 168 hours. BNP (last 3 results) No results for input(s): PROBNP in the last 8760 hours. HbA1C: No results for input(s): HGBA1C in the last 72 hours. CBG: No results for input(s): GLUCAP in the last 168 hours. Lipid Profile: No results for input(s): CHOL, HDL, LDLCALC, TRIG, CHOLHDL, LDLDIRECT in the last 72 hours. Thyroid Function Tests: No results for input(s): TSH, T4TOTAL, FREET4, T3FREE, THYROIDAB in the last 72 hours. Anemia Panel: No results for input(s): VITAMINB12, FOLATE, FERRITIN, TIBC, IRON, RETICCTPCT in the last 72 hours. Urine analysis:    Component Value Date/Time   COLORURINE YELLOW 04/10/2017 0743   APPEARANCEUR CLEAR 04/10/2017 0743   LABSPEC 1.015 04/10/2017 0743   PHURINE 6.0 04/10/2017 0743   GLUCOSEU >=500 (A) 04/10/2017 0743   GLUCOSEU 500 (?) 05/08/2010 1434   HGBUR NEGATIVE 04/10/2017 0743   BILIRUBINUR NEGATIVE 04/10/2017 0743   KETONESUR NEGATIVE 04/10/2017 0743   PROTEINUR NEGATIVE 04/10/2017 0743   UROBILINOGEN 0.2 04/11/2013 0008   NITRITE NEGATIVE 04/10/2017 0743   LEUKOCYTESUR  NEGATIVE 04/10/2017 0743    Radiological Exams on Admission: DG Chest 2 View  Result Date: 10/26/2019 CLINICAL DATA:  Shortness of breath. EXAM: CHEST - 2 VIEW COMPARISON:  June 28, 2019 FINDINGS: The heart size and mediastinal contours are within normal limits. Both lungs are clear. The visualized skeletal structures are unremarkable. IMPRESSION: No active cardiopulmonary disease. Electronically Signed   By: Constance Holster M.D.   On: 10/26/2019 03:53   DG Chest Port 1 View  Result Date: 10/26/2019 CLINICAL DATA:  Cough and shortness of breath over the last week. EXAM: PORTABLE CHEST 1 VIEW COMPARISON:  Earlier same [place] FINDINGS: The heart size and mediastinal contours are within normal limits. Both lungs are clear. The visualized skeletal structures are unremarkable. IMPRESSION: No active disease. Electronically Signed   By: Nelson Chimes M.D.   On: 10/26/2019 10:41    Assessment/Plan James [name] is a 64 y.o. male with medical history significant of asthma, CAD, T1DM, HLD, GERD, and Depression who presents with worsening shortness of breath with hypoxemic respiratory failure 2/2 acute asthma exacerbation  # Acute asthma exacerbation # Acute Hypoxemic respiratory failure - rapid COVID pending but lower suspicion, CXR is clear; PFTs last year were not c/w COPD; precipitant likely pollen this time - will finish azithromycin course, does appear to have bronchitis - continue LABA-ICS, nebs, supplemental oxygen - received solumedrol 125 mg en route, will give another dose this evening and then continue 40 mg daily (prior to today patient had received prednisone 40 mg x 3 days) - continue loratadine  # T1DM - continue insulin pump in house - will schedule accuchecks - if issues with pump, patient can turn off and we'd need to transition to Basal Bolus regimen - presently with moderate dose ISS - Diabetes coordinator consult placed  # HLD #  CAD - continue simvastatin, not on aspirin  presently, hx in chart of CAD dating to 2008 - will order Aspirin 81 mg  # Tobacco use disorder - no longer smoking, continue varenicline  DVT prophylaxis: Lovenox Code Status: Full Disposition Plan: Inpatient     Truddie Hidden MD [facility] Pager [phone]  If 7PM-7AM, please contact night-coverage [email or web address] Password [facility]  10/26/2019, 11:48 AM

## 2019-10-26 NOTE — ED Notes (Signed)
Called pt from lobby to room No answer Staff checked outside and called pt's name No answer

## 2019-10-26 NOTE — ED Triage Notes (Signed)
64 yo male from home c/o [facility] since Wednesday. Worse this morning. Hx of Asthma, COPD, IDDM. +productive cough. On EMS arrival pt unable to speak in full sentences. + Wheezing. Received 5mg  Albuterol Neb by [facility]. Ronchi noted after Neb. Pt also given Solumedrol 125mg  IV and Epi 0.3mg  IM by EMS with effect. Sat initially 90% RA and up to 100% 10L NRB. 12 lead negative. No covid exposures. Was here thisAM per EMS but LWBS.

## 2019-10-26 NOTE — ED Triage Notes (Signed)
Pt sts shob x 3 days. Used rescue inhaler twice with minimal results. Labored respirations.

## 2019-10-26 NOTE — ED Provider Notes (Signed)
[facility] DEPT Provider Note   CSN: [id number] Arrival date & time: 10/26/19  [id number]     History Chief Complaint  Patient presents with  . Shortness of Breath  . Cough    James [name] is a 64 y.o. male.  Patient brought in by EMS for shortness of breath.  Patient was here last evening.  Left due to long wait.  He did have oxygen saturations anywhere from 87 to 92% when he was here before had a chest x-ray at that time which was clear.  Patient's been struggling for about the past 4 days with the worsening of his asthma or COPD.  Gets coughing fits.  Seen by pulmonary medicine and started on steroid Dosepak.  And started on a Z-Pak.  Patient does not use oxygen at home.  Patient does have nebulizer treatments at home.  Just not able to get this better.  In the past at this time a year he is is also been related to allergies.  EMS gave him 125 Solu-Medrol and 0.3 mg of IM epinephrine.  Initial sats were 90% on room air.  They put him on a 10 L nonrebreather which brought him up to 100%.  Here patient's oxygen saturations on room air will drop down below 90.  On 2 L he is in the low 90s.  Patient with persistent wheezing.  Patient without fever.        Past Medical History:  Diagnosis Date  . ANXIETY 04/03/2007  . Anxiety   . ASTHMA 09/06/2008  . Asthma   . ASTHMATIC BRONCHITIS, ACUTE 10/25/2008  . Bladder neck obstruction   . CARPAL TUNNEL SYNDROME, BILATERAL 07/31/2007   issues resolved, no surgery  . Cervical disc disease   . Chronic bronchitis ([place])    "get it about q yr" (02/12/2014)  . COPD (chronic obstructive pulmonary disease) ([place])   . CORONARY ARTERY DISEASE 04/03/2007  . DEPRESSION 04/03/2007  . Depression   . DIABETES MELLITUS, TYPE I 04/03/2007  . Diabetic retinopathy associated with diabetes mellitus due to underlying condition ([place]) 04/03/2007  . DM W/EYE MANIFESTATIONS, TYPE I, UNCONTROLLED 04/04/2007  . DM W/RENAL MNFST, TYPE I, UNCONTROLLED  04/04/2007  . ED (erectile dysfunction)   . History of kidney stones   . HYPERLIPIDEMIA 04/04/2007  . Pneumonia    "several times and again today" (02/13/2104)  . Renal insufficiency   . Seizures ([place])    "insulin seizure from time to time; none in the last couple years" (02/12/2014)  . Spinal stenosis     Patient Active Problem List   Diagnosis Date Noted  . Seizure disorder, secondary ([place]) 07/19/2019  . Abnormal CT of the chest 07/19/2019  . Acute asthma exacerbation 06/29/2019  . Asthma exacerbation 06/28/2019  . 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 Hypoglycemia due to insulin 04/20/2019  . DM type 1 (diabetes mellitus, type 1) ([place]) 04/20/2019  . HNP (herniated nucleus pulposus), cervical 06/24/2016  . Syncope 05/20/2016  . Tobacco abuse 05/20/2016  . Cervical disc disorder with radiculopathy of cervical region 05/20/2016  . Aspiration pneumonia ([place]) 02/12/2014  . Gastroparesis 02/10/2011  . Esophageal reflux 12/21/2010  . TOBACCO USE, QUIT 05/08/2010  . CONTUSION, RIGHT CHEST WALL 04/07/2010  . Chronic asthmatic bronchitis ([place]) 10/25/2008  . Asthma, persistent controlled 09/06/2008  . COUGH 09/06/2008  . SHOULDER PAIN, RIGHT 08/19/2008  . BLADDER OUTLET OBSTRUCTION 04/15/2008  . RASH AND OTHER NONSPECIFIC SKIN ERUPTION 02/21/2008  .  CARPAL TUNNEL SYNDROME, BILATERAL 07/31/2007  . Allergic rhinitis 07/31/2007  . RENAL INSUFFICIENCY 07/31/2007  . OTHER TENOSYNOVITIS OF HAND AND WRIST 07/31/2007  . 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 HLD (hyperlipidemia) 04/04/2007  . DIABETIC RETINOPATHY, PROLIFERATIVE 04/03/2007  . ANXIETY 04/03/2007  . DEPRESSION 04/03/2007  . 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 CAD (coronary artery disease) 04/03/2007  . DYSHIDROSIS 04/03/2007    Past Surgical History:  Procedure  Laterality Date  . ANTERIOR CERVICAL DECOMP/DISCECTOMY FUSION  2000   "couple screws and a plate"  . ANTERIOR CERVICAL DECOMP/DISCECTOMY FUSION N/A 06/24/2016   Procedure: ANTERIOR CERVICAL DECOMPRESSION/DISCECTOMY FUSION CERVICAL FOUR - CERVICAL FIVE, CERVICAL FIVE - CERVICAL SIX; REMOVAL TETHER CERVICAL PLATE;  Surgeon: Jovita Gamma, MD;  Location: [facility];  Service: Neurosurgery;  Laterality: N/A;  ANTERIOR CERVICAL DECOMPRESSION/DISCECTOMY FUSION CERVICAL FOUR - CERVICAL FIVE, CERVICAL FIVE - CERVICAL SIX; REMOVAL TETHER CERVICAL PLATE  . APPENDECTOMY    . BACK SURGERY    . CARDIAC CATHETERIZATION  1990's  . CATARACT EXTRACTION W/ INTRAOCULAR LENS  IMPLANT, BILATERAL Bilateral   . CYSTOSCOPY WITH RETROGRADE PYELOGRAM, URETEROSCOPY AND STENT PLACEMENT Bilateral 04/06/2013   Procedure: BILATERAL CYSTOSCOPY WITH RETROGRADE PYELOGRAMS, STENT PLACEMENTS AND LEFT URETEROSCOPY AND STONE REMOVAL;  Surgeon: Alexis Frock, MD;  Location: [facility];  Service: Urology;  Laterality: Bilateral;  . CYSTOSCOPY WITH STENT PLACEMENT Right 04/12/2013   Procedure: CYSTOSCOPY WITH STENT PLACEMENT;  Surgeon: Alexis Frock, MD;  Location: [facility];  Service: Urology;  Laterality: Right;  . CYSTOSCOPY/RETROGRADE/URETEROSCOPY Bilateral 04/12/2013   Procedure: CYSTOSCOPY/RETROGRADE/URETEROSCOPY;  Surgeon: Alexis Frock, MD;  Location: [facility];  Service: Urology;  Laterality: Bilateral;  RIGHT RETROGRADE   . HOLMIUM LASER APPLICATION Left [id number]   Procedure: HOLMIUM LASER APPLICATION;  Surgeon: Alexis Frock, MD;  Location: [facility];  Service: Urology;  Laterality: Left;  . LUMBAR LAMINECTOMY/DECOMPRESSION MICRODISCECTOMY N/A 04/20/2019   Procedure: Lumbar microdisectomy and decompression L5-S1 left;  Surgeon: Latanya Maudlin, MD;  Location: [facility];  Service: Orthopedics;  Laterality: N/A;  77min  . LYMPH NODE DISSECTION  ~ 1960   groin  . stress cardiolite  09/06/2002  . TONSILLECTOMY    . VITRECTOMY Bilateral         Family History  Problem Relation Age of Onset  . Stroke Father        strong FH cerebrovascular disease  . Diabetes Brother   . Heart disease Brother        CHF  . Cancer Mother   . Colon cancer Neg Hx     Social History   Tobacco Use  . Smoking status: Former Smoker    Packs/Pruss: 0.00    Types: Cigarettes    Quit date: 08/20/2019    Years since quitting: 0.1  . Smokeless tobacco: Never Used  . Tobacco comment: Successfully quit smoking 08/20/2019  Substance Use Topics  . Alcohol use: No  . Drug use: No    Home Medications Prior to Admission medications   Medication Sig Start Date End Date Taking? Authorizing Provider  fexofenadine (ALLEGRA) 180 MG tablet Take 180 mg by mouth daily.   Yes [provider]  simvastatin (ZOCOR) 40 MG tablet Take 40 mg by mouth at bedtime.    Yes [provider]  traZODone (DESYREL) 150 MG tablet Take 150 mg by mouth at bedtime.   Yes [provider]  varenicline (CHANTIX CONTINUING MONTH PAK) 1  MG tablet Take 1 tablet (1 mg total) by mouth 2 (two) times daily. 09/17/19  Yes Martyn Ehrich, NP  albuterol (PROVENTIL) (2.5 MG/3ML) 0.083% nebulizer solution USE 1 VIAL VIA NEBULIZER EVERY 6 HOURS AS NEEDED FOR WHEEZING OR SHORTNESS OF BREATH Patient taking differently: Take 2.5 mg by nebulization every 6 (six) hours as needed for wheezing or shortness of breath.  06/26/19   Rigoberto Noel, MD  albuterol (VENTOLIN HFA) 108 (90 Base) MCG/ACT inhaler INHALE 2 PUFFS BY MOUTH INTO THE LUNGS EVERY 6 HOURS AS NEEDED FOR WHEEZING OR SHORTNESS OF BREATH. Patient not taking: No sig reported 06/11/19   Melvenia Needles, NP  azithromycin (ZITHROMAX) 250 MG tablet Zpack taper as directed 10/22/19   Martyn Ehrich, NP  Fluticasone-Salmeterol (ADVAIR DISKUS) 100-50 MCG/DOSE AEPB Inhale 1 puff into the lungs 2 (two) times daily. 08/13/19   Martyn Ehrich, NP  HYDROcodone-homatropine [facility]) 5-1.5 MG/5ML syrup Take 5 mLs by  mouth every 6 (six) hours as needed for cough. 10/26/19   Martyn Ehrich, NP  hydrOXYzine (VISTARIL) 25 MG capsule Take 1 capsule (25 mg total) by mouth every 4 (four) hours as needed for anxiety. Patient not taking: Reported on 08/13/2019 07/08/19   Donne Hazel, MD  insulin lispro (HUMALOG) 100 UNIT/ML KwikPen Junior Inject 0.05 mLs (5 Units total) into the skin 3 (three) times daily before meals. 07/08/19   Donne Hazel, MD  insulin lispro (HUMALOG) 100 UNIT/ML KwikPen Junior Sliding scale: CBG 70 - 120: 0 units  CBG 121 - 150: 2 units  CBG 151 - 200: 3 units  CBG 201 - 250: 5 units  CBG 251 - 300: 8 units  CBG 301 - 350: 11 units  CBG 351 - 400: 15 units 07/08/19   Donne Hazel, MD  Insulin Pen Needle 31G X 5 MM MISC 1 Device by Does not apply route 4 (four) times daily. For use with insulin pens 07/08/19   Donne Hazel, MD  nicotine (NICODERM CQ - DOSED IN MG/24 HOURS) 21 mg/24hr patch Place 1 patch (21 mg total) onto the skin daily. 08/13/19   Martyn Ehrich, NP  nicotine polacrilex (NICOTINE MINI) 2 MG lozenge Take 1 lozenge (2 mg total) by mouth as needed for smoking cessation. 08/13/19   Martyn Ehrich, NP  ondansetron (ZOFRAN) 4 MG tablet Take 1 tablet (4 mg total) by mouth every 6 (six) hours as needed for nausea or vomiting. 04/20/19   Constable, Amber, PA-C  polyvinyl alcohol (LIQUIFILM TEARS) 1.4 % ophthalmic solution Place 1 drop into both eyes as needed for dry eyes.    [provider]  predniSONE (DELTASONE) 10 MG tablet Take 4 tabs po daily x 3 days; then 3 tabs daily x3 days; then 2 tabs daily x3 days; then 1 tab daily x 3 days; then stop 10/22/19   Martyn Ehrich, NP    Allergies    Augmentin [amoxicillin-pot clavulanate] and Lexapro [escitalopram oxalate]  Review of Systems   Review of Systems  Constitutional: Negative for chills and fever.  HENT: Negative for congestion, rhinorrhea and sore throat.   Eyes: Negative for visual disturbance.   Respiratory: Positive for shortness of breath and wheezing. Negative for cough.   Cardiovascular: Negative for chest pain and leg swelling.  Gastrointestinal: Negative for abdominal pain, diarrhea, nausea and vomiting.  Genitourinary: Negative for dysuria.  Musculoskeletal: Negative for back pain and neck pain.  Skin: Negative for rash.  Neurological: Negative  for dizziness, light-headedness and headaches.  Hematological: Does not bruise/bleed easily.  Psychiatric/Behavioral: Negative for confusion.    Physical Exam Updated Vital Signs BP 135/71   Pulse 86   Temp 99.2 F (37.3 C) (Oral)   Resp (!) 31   SpO2 93%   Physical Exam Vitals and nursing note reviewed.  Constitutional:      General: He is in acute distress.     Appearance: He is well-developed.  HENT:     Head: Normocephalic and atraumatic.  Eyes:     Extraocular Movements: Extraocular movements intact.     Conjunctiva/sclera: Conjunctivae normal.     Pupils: Pupils are equal, round, and reactive to light.  Cardiovascular:     Rate and Rhythm: Normal rate and regular rhythm.     Heart sounds: No murmur.  Pulmonary:     Effort: Respiratory distress present.     Breath sounds: Wheezing present.  Abdominal:     Palpations: Abdomen is soft.     Tenderness: There is no abdominal tenderness.  Musculoskeletal:        General: Normal range of motion.     Cervical back: Normal range of motion and neck supple.  Skin:    General: Skin is warm and dry.     Capillary Refill: Capillary refill takes less than 2 seconds.  Neurological:     General: No focal deficit present.     Mental Status: He is alert and oriented to person, place, and time.     Cranial Nerves: No cranial nerve deficit.     Sensory: No sensory deficit.     Motor: No weakness.     ED Results / Procedures / Treatments   Labs (all labs ordered are listed, but only abnormal results are displayed) Labs Reviewed  CBC WITH DIFFERENTIAL/PLATELET -  Abnormal; Notable for the following components:      Result Value   RBC 4.11 (*)    HCT 38.2 (*)    Lymphs Abs 0.5 (*)    Eosinophils Absolute 0.6 (*)    All other components within normal limits  BASIC METABOLIC PANEL - Abnormal; Notable for the following components:   Glucose, Bld 223 (*)    Calcium 8.4 (*)    All other components within normal limits  CBG MONITORING, ED - Abnormal; Notable for the following components:   Glucose-Capillary 106 (*)    All other components within normal limits  RESPIRATORY PANEL BY RT PCR (FLU A&B, COVID)  HEMOGLOBIN A1C    EKG EKG Interpretation  Date/Time:  Friday October 26 2019 10:12:22 EDT Ventricular Rate:  87 PR Interval:    QRS Duration: 87 QT Interval:  368 QTC Calculation: 443 R Axis:   81 Text Interpretation: Sinus rhythm Borderline right axis deviation Probable anteroseptal infarct, old Minimal ST elevation, inferior leads No significant change since last tracing Confirmed by Fredia Sorrow [phone]) on 10/26/2019 10:39:42 AM   Radiology DG Chest 2 View  Result Date: 10/26/2019 CLINICAL DATA:  Shortness of breath. EXAM: CHEST - 2 VIEW COMPARISON:  June 28, 2019 FINDINGS: The heart size and mediastinal contours are within normal limits. Both lungs are clear. The visualized skeletal structures are unremarkable. IMPRESSION: No active cardiopulmonary disease. Electronically Signed   By: Constance Holster M.D.   On: 10/26/2019 03:53   DG Chest Port 1 View  Result Date: 10/26/2019 CLINICAL DATA:  Cough and shortness of breath over the last week. EXAM: PORTABLE CHEST 1 VIEW COMPARISON:  Earlier same [place]  FINDINGS: The heart size and mediastinal contours are within normal limits. Both lungs are clear. The visualized skeletal structures are unremarkable. IMPRESSION: No active disease. Electronically Signed   By: Nelson Chimes M.D.   On: 10/26/2019 10:41    Procedures Procedures (including critical care time)  Medications Ordered in  ED Medications  ondansetron (ZOFRAN) tablet 4 mg (has no administration in time range)  acetaminophen (TYLENOL) tablet 650 mg (has no administration in time range)  azithromycin (ZITHROMAX) tablet 250 mg (has no administration in time range)  simvastatin (ZOCOR) tablet 40 mg (has no administration in time range)  nicotine polacrilex (COMMIT) lozenge 2 mg (has no administration in time range)  traZODone (DESYREL) tablet 150 mg (has no administration in time range)  varenicline (CHANTIX) tablet 1 mg (has no administration in time range)  predniSONE (DELTASONE) tablet 40 mg (has no administration in time range)  albuterol (PROVENTIL) (2.5 MG/3ML) 0.083% nebulizer solution 2.5 mg (has no administration in time range)  albuterol (VENTOLIN HFA) 108 (90 Base) MCG/ACT inhaler 1-2 puff (has no administration in time range)  loratadine (CLARITIN) tablet 10 mg (has no administration in time range)  fluticasone furoate-vilanterol (BREO ELLIPTA) 100-25 MCG/INH 1 puff (has no administration in time range)  polyvinyl alcohol (LIQUIFILM TEARS) 1.4 % ophthalmic solution 1 drop (has no administration in time range)  methylPREDNISolone sodium succinate (SOLU-MEDROL) 125 mg/2 mL injection 125 mg (has no administration in time range)  aspirin chewable tablet 81 mg (has no administration in time range)    ED Course  I have reviewed the triage vital signs and the nursing notes.  Pertinent labs & imaging results that were available during my care of the patient were reviewed by me and considered in my medical decision making (see chart for details).    MDM Rules/Calculators/A&P                      Also patient with known history of diabetes.  Blood sugars in the 200 range.  But has been on steroids.  Patient with the treatment that EMS gave patient has stabilized but still having persistent wheezing.  But overall doing better than he was.  On 2 L oxygen sats stay in the low 90s.  If he is off oxygen he will  drop below 90%.  Chest x-ray negative for any infiltrates or pneumonia.  Support this may be an acute bronchitis asthmatic reaction.  Covid testing ordered and pending.  Was planning to do nebulizer treatments once Covid testing was negative.  Gust with hospitalist who will see for the COPD asthma exacerbation.  May be seasonal allergy related.     Final Clinical Impression(s) / ED Diagnoses Final diagnoses:  COPD exacerbation ([place])  Severe persistent asthma with exacerbation  Hypoxia    Rx / DC Orders ED Discharge Orders    None       Fredia Sorrow, MD 10/26/19 1414

## 2019-10-27 DIAGNOSIS — I2510 Atherosclerotic heart disease of native coronary artery without angina pectoris: Secondary | ICD-10-CM | POA: Diagnosis not present

## 2019-10-27 DIAGNOSIS — E1069 Type 1 diabetes mellitus with other specified complication: Secondary | ICD-10-CM

## 2019-10-27 DIAGNOSIS — J4551 Severe persistent asthma with (acute) exacerbation: Secondary | ICD-10-CM | POA: Diagnosis not present

## 2019-10-27 DIAGNOSIS — E785 Hyperlipidemia, unspecified: Secondary | ICD-10-CM

## 2019-10-27 DIAGNOSIS — I2583 Coronary atherosclerosis due to lipid rich plaque: Secondary | ICD-10-CM

## 2019-10-27 DIAGNOSIS — R05 Cough: Secondary | ICD-10-CM

## 2019-10-27 DIAGNOSIS — J449 Chronic obstructive pulmonary disease, unspecified: Secondary | ICD-10-CM

## 2019-10-27 DIAGNOSIS — Z87891 Personal history of nicotine dependence: Secondary | ICD-10-CM

## 2019-10-27 DIAGNOSIS — J45901 Unspecified asthma with (acute) exacerbation: Secondary | ICD-10-CM | POA: Diagnosis not present

## 2019-10-27 LAB — CBC
HCT: 40.6 % (ref 39.0–52.0)
Hemoglobin: 14.4 g/dL (ref 13.0–17.0)
MCH: 32.7 pg (ref 26.0–34.0)
MCHC: 35.5 g/dL (ref 30.0–36.0)
MCV: 92.3 fL (ref 80.0–100.0)
Platelets: 268 10*3/uL (ref 150–400)
RBC: 4.4 MIL/uL (ref 4.22–5.81)
RDW: 11.9 % (ref 11.5–15.5)
WBC: 6.4 10*3/uL (ref 4.0–10.5)
nRBC: 0 % (ref 0.0–0.2)

## 2019-10-27 LAB — BASIC METABOLIC PANEL
Anion gap: 10 (ref 5–15)
BUN: 22 mg/dL (ref 8–23)
CO2: 24 mmol/L (ref 22–32)
Calcium: 8.9 mg/dL (ref 8.9–10.3)
Chloride: 104 mmol/L (ref 98–111)
Creatinine, Ser: 0.75 mg/dL (ref 0.61–1.24)
GFR calc Af Amer: >60 mL/min (ref >60)
GFR calc non Af Amer: >60 mL/min (ref >60)
Glucose, Bld: 164 mg/dL — ABNORMAL HIGH (ref 70–99)
Potassium: 4.4 mmol/L (ref 3.5–5.1)
Sodium: 138 mmol/L (ref 135–145)

## 2019-10-27 LAB — GLUCOSE, CAPILLARY
Glucose-Capillary: 141 mg/dL — ABNORMAL HIGH (ref 70–99)
Glucose-Capillary: 158 mg/dL — ABNORMAL HIGH (ref 70–99)
Glucose-Capillary: 205 mg/dL — ABNORMAL HIGH (ref 70–99)
Glucose-Capillary: 175 mg/dL — ABNORMAL HIGH (ref 70–99)
Glucose-Capillary: 248 mg/dL — ABNORMAL HIGH (ref 70–99)
Glucose-Capillary: 113 mg/dL — ABNORMAL HIGH (ref 70–99)

## 2019-10-27 LAB — HEMOGLOBIN A1C
Hgb A1c MFr Bld: 6.5 % — ABNORMAL HIGH (ref 4.8–5.6)
Mean Plasma Glucose: 140 mg/dL

## 2019-10-27 MED ORDER — HYDROCODONE-HOMATROPINE 5-1.5 MG/5ML PO SYRP
5.0000 mL | ORAL_SOLUTION | Freq: Four times a day (QID) | ORAL | Status: DC | PRN
  Administered 2019-10-27 – 2019-10-30 (×11): 5 mL via ORAL
  Filled 2019-10-27 (×11): qty 5

## 2019-10-27 MED ORDER — IPRATROPIUM-ALBUTEROL 0.5-2.5 (3) MG/3ML IN SOLN
3.0000 mL | Freq: Three times a day (TID) | RESPIRATORY_TRACT | Status: DC
  Administered 2019-10-27 – 2019-11-01 (×16): 3 mL via RESPIRATORY_TRACT
  Filled 2019-10-27 (×16): qty 3

## 2019-10-27 MED ORDER — LEVALBUTEROL HCL 0.63 MG/3ML IN NEBU: 0.6300 mg | INHALATION_SOLUTION | Freq: Four times a day (QID) | RESPIRATORY_TRACT | Status: DC

## 2019-10-27 MED ORDER — GUAIFENESIN ER 600 MG PO TB12
1200.0000 mg | ORAL_TABLET | Freq: Two times a day (BID) | ORAL | Status: DC
  Administered 2019-10-27 – 2019-11-01 (×11): 1200 mg via ORAL
  Filled 2019-10-27 (×11): qty 2

## 2019-10-27 MED ORDER — ARFORMOTEROL TARTRATE 15 MCG/2ML IN NEBU
15.0000 ug | INHALATION_SOLUTION | Freq: Two times a day (BID) | RESPIRATORY_TRACT | Status: DC
  Administered 2019-10-27 – 2019-11-01 (×10): 15 ug via RESPIRATORY_TRACT
  Filled 2019-10-27 (×13): qty 2

## 2019-10-27 MED ORDER — IPRATROPIUM BROMIDE 0.02 % IN SOLN: 0.5000 mg | Freq: Four times a day (QID) | RESPIRATORY_TRACT | Status: DC

## 2019-10-27 MED ORDER — BUDESONIDE 0.25 MG/2ML IN SUSP
0.2500 mg | Freq: Two times a day (BID) | RESPIRATORY_TRACT | Status: DC
  Administered 2019-10-27 – 2019-10-28 (×2): 0.25 mg via RESPIRATORY_TRACT
  Filled 2019-10-27 (×2): qty 2

## 2019-10-27 MED ORDER — METHYLPREDNISOLONE SODIUM SUCC 125 MG IJ SOLR
60.0000 mg | Freq: Two times a day (BID) | INTRAMUSCULAR | Status: DC
  Administered 2019-10-27 – 2019-10-28 (×2): 60 mg via INTRAVENOUS
  Filled 2019-10-27 (×2): qty 2

## 2019-10-27 NOTE — Progress Notes (Signed)
PROGRESS NOTE    James [name]  [id number] DOB: 17-Oct-1955 DOA: 10/26/2019 PCP: [name], MD   Brief Narrative:  HPI per Dr. Truddie Hidden on 10/26/2019 James [name] is a 64 y.o. male with medical history significant of asthma, CAD, T1DM, HLD, GERD, and Depression who presents with worsening shortness of breath.  Patient reports symptom onset Monday with worsening by Tuesday.  He reports he called and to pulmonology and he did not have any appointments but did prescribe him prednisone with taper as well as azithromycin until a potential follow-up appointment on Monday.  He states despite this he has had progressive symptoms, predominantly shortness of breath and dyspnea on exertion.  He has had a sore throat and productive cough which was more prominent earlier in the course and now is mostly dry with occasional clear sputum production.  He denies any fevers or chills, has chest pain only after serious coughing spells.  He states his coughing spells can be quite severe.  He states he is no longer smoking.  He is on Chantix.  He has been utilizing his inhalers more frequently, several times a Riffel.  No nausea, vomiting or diarrhea.  No loss of smell or taste.  No rashes.  He lives with his wife.  At baseline is independent in his ADLs.  **Interim History  Respiratory status was still tenuous and states that whenever he ambulates he becomes very short of breath.  States that this happens about 2 times a year.  Breathing treatments were adjusted and we will repeat a chest x-ray in a.m.  Assessment & Plan:   Active Problems:   HLD (hyperlipidemia)   CAD (coronary artery disease)   Chronic asthmatic bronchitis (HCC)   Cough   TOBACCO USE, QUIT   Tobacco abuse   DM type 1 (diabetes mellitus, type 1) (HCC)   Asthma exacerbation   Acute asthma exacerbation   Acute Hypoxic Respiratory Failure in the setting of Acute Exacerbation of Asthma -Admitted as Inpatient  -Rapid COVID Negative  along with Influenza A/B via PCR but will check a official respiratory virus panel -CXR showed "The heart size and mediastinal contours are within normal limits. Both lungs are clear. The visualized skeletal structures are unremarkable." -PFTs last year were not c/w COPD; precipitant likely pollen this time -Will finish azithromycin course, does appear to have bronchitis; if he does not continue to improve will switch to doxycycline or Levaquin -Received solumedrol 125 mg en route and given another dose in the ED and was continued on 40 mg daily of po Prednisone  (prior to today patient had received prednisone 40 mg x 3 days) but will stop and start IV Solumedrol 60 mg q12h -Stop is Breo Ellipta and start budesonide and Brovana nebs -Start albuterol 3 times daily scheduled and will start Xopenex/Atrovent every 6 schedule -Continue with albuterol as needed 2.5 mg neb every 6 as needed for wheezing -Obtain peak flow meter monitoring as well as incentive spirometer, flutter valve next-we will start guaifenesin 1200 mg p.o. twice daily -Continuous pulse oximetry and maintain O2 saturation greater than 90% Plan continue supplemental oxygen via nasal cannula and wean O2 as tolerated -We will need an ambulatory home O2 screen prior to discharge -Continue antitussives with hydrocodone-homatroprine 5 mL p.o. every 6 hours as needed for cough -Continue with Loratadine 10 mg p.o. daily -If not improving or fails to improve significantly will obtain a chest CTA to rule out pulmonary embolus and consult pulmonary for further  evaluation recommendations next-repeat chest x-ray intermittently and repeated in the a.m. -Continue to monitor respiratory status carefully  Well-controlled Type 1 Diabetes Mellitus -Continue insulin pump in house with diabetes education coordinator to assist -Will schedule accuchecks -Iif issues with pump, patient can turn off and we'd need to transition to Basal Bolus regimen  -Presently with moderate dose ISS -CBGs ranging from 106-205; sugar this morning on BMP was 164 -Diabetes coordinator consult placed and diabetes education "I recommended that if blood sugars begin to rise with steroids may consider stopping insulin pump off of auto mode in terms of manual mode and will do a temporary basal of 125%  CAD -Continue simvastatin, not on aspirin presently, hx in chart of CAD dating to 2008 -Now ordered for Aspirin 81 mg po Daily   HLD -Continue with Simvastatin 40 mg p.o. nightly  Tobacco Use Disorder -Smoking cessation counseling given and patient confirmed that he is no longer walking -Continue Varenicline 1 mg po BID along with Nicotine Polacrilex 2 mg po PRN Smoking Cessastion  Anxiety -No longer taking Hydroxyzine 1 tablet every 4 hours as needed for anxiety -Continue to Monitor   Overweight -Estimated body mass index is 25.84 kg/m as calculated from the following:   Height as of an earlier encounter on 10/26/19: 5\' 9"  (1.753 m).   Weight as of an earlier encounter on 10/26/19: 79.4 kg. -Weight Loss and Dietary Counseling given   DVT prophylaxis: Enoxaparin 40 mg sq q24h Code Status: FULL CODE  Family Communication: No family present at bedside  Disposition Plan: Patient is from home and will need further clinical improvement of his respiratory status and be able to be weaned off of oxygen prior to safe discharge disposition which is likely back to home  Consultants:   None  Procedures:   None  Antimicrobials:  Anti-infectives (From admission, onward)   Start     Dose/Rate Route Frequency Ordered Stop   10/26/19 1400  azithromycin (ZITHROMAX) tablet 250 mg    Note to Pharmacy: Zpack taper as directed     250 mg Oral Daily 10/26/19 1351 10/27/19 1021     Subjective: Seen and examined at bedside and states that he still very short of breath and had some dyspnea on conversation.  Continues to wheeze and states that he has never been  officially diagnosed with COPD.  No chest pain, lightheadedness or dizziness but does have a cough and states that he becomes to the point of almost passing out when he coughs significantly.  No other concerns or complaints at this time but still does remain short of breath.  Objective: Vitals:   10/27/19 0312 10/27/19 0700 10/27/19 0743 10/27/19 1323  BP: (!) 144/81 137/76  138/66  Pulse: 94 91 98 98  Resp: 18 20 18 16   Temp: 97.9 F (36.6 C) 97.7 F (36.5 C)  98.5 F (36.9 C)  TempSrc: Oral Oral  Oral  SpO2: 91% 94% 93% 93%    Intake/Output Summary (Last 24 hours) at 10/27/2019 1408 Last data filed at 10/27/2019 0953 Gross per 24 hour  Intake 240 ml  Output -  Net 240 ml   There were no vitals filed for this visit.  Examination: Physical Exam:  Constitutional: WN/WD overweight Caucasian male currently in mild respiratory distress appears slightly anxious and mildly uncomfortable Eyes: Lids and conjunctivae normal, sclerae anicteric  ENMT: External Ears, Nose appear normal. Grossly normal hearing.  Neck: Appears normal, supple, no cervical masses, normal ROM, no appreciable thyromegaly; no  JVD Respiratory: Diminished to auscultation bilaterally with coarse breath sounds and dyspnea on conversation along with expiratory wheezing and rales; no appreciable crackles.  He has increased respiratory effort and he is wearing supplemental oxygen via nasal cannula Cardiovascular: RRR, no murmurs / rubs / gallops. S1 and S2 auscultated.  No appreciable extremity edema.  Abdomen: Soft, non-tender, distended slightly to body habitus. Bowel sounds positive.  GU: Deferred. Musculoskeletal: No clubbing / cyanosis of digits/nails. No joint deformity upper and lower extremities.  Skin: No rashes, lesions, ulcers on limited skin evaluation. No induration; Warm and dry.  Neurologic: CN 2-12 grossly intact with no focal deficits. Romberg sign and cerebellar reflexes not assessed.  Psychiatric:  Normal judgment and insight. Alert and oriented x 3.  Mildly anxious mood and appropriate affect.   Data Reviewed: I have personally reviewed following labs and imaging studies  CBC: Recent Labs  Lab 10/26/19 1019 10/27/19 0546  WBC 5.8 6.4  NEUTROABS 4.1  --   HGB 13.5 14.4  HCT 38.2* 40.6  MCV 92.9 92.3  PLT 233 [id number]   Basic Metabolic Panel: Recent Labs  Lab 10/26/19 1019 10/27/19 0546  NA 138 138  K 3.5 4.4  CL 107 104  CO2 23 24  GLUCOSE 223* 164*  BUN 16 22  CREATININE 0.70 0.75  CALCIUM 8.4* 8.9   GFR: Estimated Creatinine Clearance: 93.3 mL/min (by C-G formula based on SCr of 0.75 mg/dL). Liver Function Tests: No results for input(s): AST, ALT, ALKPHOS, BILITOT, PROT, ALBUMIN in the last 168 hours. No results for input(s): LIPASE, AMYLASE in the last 168 hours. No results for input(s): AMMONIA in the last 168 hours. Coagulation Profile: No results for input(s): INR, PROTIME in the last 168 hours. Cardiac Enzymes: No results for input(s): CKTOTAL, CKMB, CKMBINDEX, TROPONINI in the last 168 hours. BNP (last 3 results) No results for input(s): PROBNP in the last 8760 hours. HbA1C: Recent Labs    10/26/19 1019  HGBA1C 6.5*   CBG: Recent Labs  Lab 10/26/19 2054 10/26/19 2350 10/27/19 0310 10/27/19 0728 10/27/19 1136  GLUCAP 195* 153* 141* 158* 205*   Lipid Profile: No results for input(s): CHOL, HDL, LDLCALC, TRIG, CHOLHDL, LDLDIRECT in the last 72 hours. Thyroid Function Tests: No results for input(s): TSH, T4TOTAL, FREET4, T3FREE, THYROIDAB in the last 72 hours. Anemia Panel: No results for input(s): VITAMINB12, FOLATE, FERRITIN, TIBC, IRON, RETICCTPCT in the last 72 hours. Sepsis Labs: No results for input(s): PROCALCITON, LATICACIDVEN in the last 168 hours.  Recent Results (from the past 240 hour(s))  Respiratory Panel by RT PCR (Flu A&B, Covid) - Nasopharyngeal Swab     Status: None   Collection Time: 10/26/19 10:14 AM   Specimen:  Nasopharyngeal Swab  Result Value Ref Range Status   SARS Coronavirus 2 by RT PCR NEGATIVE NEGATIVE Final    Comment: (NOTE) SARS-CoV-2 target nucleic acids are NOT DETECTED. The SARS-CoV-2 RNA is generally detectable in upper respiratoy specimens during the acute phase of infection. The lowest concentration of SARS-CoV-2 viral copies this assay can detect is 131 copies/mL. A negative result does not preclude SARS-Cov-2 infection and should not be used as the sole basis for treatment or other patient management decisions. A negative result may occur with  improper specimen collection/handling, submission of specimen other than nasopharyngeal swab, presence of viral mutation(s) within the areas targeted by this assay, and inadequate number of viral copies (<131 copies/mL). A negative result must be combined with clinical observations, patient history, and epidemiological information.  The expected result is Negative. Fact Sheet for Patients:  PinkCheek.be Fact Sheet for Healthcare Providers:  GravelBags.it This test is not yet ap proved or cleared by the [place] FDA and  has been authorized for detection and/or diagnosis of SARS-CoV-2 by FDA under an Emergency Use Authorization (EUA). This EUA will remain  in effect (meaning this test can be used) for the duration of the COVID-19 declaration under Section 564(b)(1) of the Act, 21 U.S.C. section 360bbb-3(b)(1), unless the authorization is terminated or revoked sooner.    Influenza A by PCR NEGATIVE NEGATIVE Final   Influenza B by PCR NEGATIVE NEGATIVE Final    Comment: (NOTE) The Xpert Xpress SARS-CoV-2/FLU/RSV assay is intended as an aid in  the diagnosis of influenza from Nasopharyngeal swab specimens and  should not be used as a sole basis for treatment. Nasal washings and  aspirates are unacceptable for Xpert Xpress SARS-CoV-2/FLU/RSV  testing. Fact Sheet for  Patients: PinkCheek.be Fact Sheet for Healthcare Providers: GravelBags.it This test is not yet approved or cleared by the [place] FDA and  has been authorized for detection and/or diagnosis of SARS-CoV-2 by  FDA under an Emergency Use Authorization (EUA). This EUA will remain  in effect (meaning this test can be used) for the duration of the  Covid-19 declaration under Section 564(b)(1) of the Act, 21  U.S.C. section 360bbb-3(b)(1), unless the authorization is  terminated or revoked. Performed at [facility], [facility] [address], [place], [place] [address]      RN Pressure Injury Documentation:     Estimated body mass index is 25.84 kg/m as calculated from the following:   Height as of an earlier encounter on 10/26/19: 5\' 9"  (1.753 m).   Weight as of an earlier encounter on 10/26/19: 79.4 kg.  Malnutrition Type:      Malnutrition Characteristics:      Nutrition Interventions:    Radiology Studies: DG Chest 2 View  Result Date: 10/26/2019 CLINICAL DATA:  Shortness of breath. EXAM: CHEST - 2 VIEW COMPARISON:  June 28, 2019 FINDINGS: The heart size and mediastinal contours are within normal limits. Both lungs are clear. The visualized skeletal structures are unremarkable. IMPRESSION: No active cardiopulmonary disease. Electronically Signed   By: Constance Holster M.D.   On: 10/26/2019 03:53   DG Chest Port 1 View  Result Date: 10/26/2019 CLINICAL DATA:  Cough and shortness of breath over the last week. EXAM: PORTABLE CHEST 1 VIEW COMPARISON:  Earlier same [place] FINDINGS: The heart size and mediastinal contours are within normal limits. Both lungs are clear. The visualized skeletal structures are unremarkable. IMPRESSION: No active disease. Electronically Signed   By: Nelson Chimes M.D.   On: 10/26/2019 10:41   Scheduled Meds: . arformoterol  15 mcg Nebulization BID  . aspirin  81 mg Oral Daily  .  budesonide (PULMICORT) nebulizer solution  0.25 mg Nebulization BID  . enoxaparin (LOVENOX) injection  40 mg Subcutaneous Q24H  . guaiFENesin  1,200 mg Oral BID  . ipratropium  0.5 mg Nebulization Q6H  . levalbuterol  0.63 mg Nebulization Q6H  . loratadine  10 mg Oral Daily  . methylPREDNISolone (SOLU-MEDROL) injection  60 mg Intravenous Q12H  . simvastatin  40 mg Oral QHS  . traZODone  150 mg Oral QHS  . varenicline  1 mg Oral BID   Continuous Infusions:   LOS: 1 [place]   Kerney Elbe, DO [facility] PAGER is on [place]  If 7PM-7AM, please contact night-coverage [email or web address]

## 2019-10-28 ENCOUNTER — Inpatient Hospital Stay (HOSPITAL_COMMUNITY): Payer: 59

## 2019-10-28 DIAGNOSIS — I2510 Atherosclerotic heart disease of native coronary artery without angina pectoris: Secondary | ICD-10-CM | POA: Diagnosis not present

## 2019-10-28 DIAGNOSIS — J449 Chronic obstructive pulmonary disease, unspecified: Secondary | ICD-10-CM | POA: Diagnosis not present

## 2019-10-28 DIAGNOSIS — J45901 Unspecified asthma with (acute) exacerbation: Secondary | ICD-10-CM | POA: Diagnosis not present

## 2019-10-28 DIAGNOSIS — J4551 Severe persistent asthma with (acute) exacerbation: Secondary | ICD-10-CM | POA: Diagnosis not present

## 2019-10-28 LAB — COMPREHENSIVE METABOLIC PANEL
ALT: 15 U/L (ref 0–44)
AST: 13 U/L — ABNORMAL LOW (ref 15–41)
Albumin: 3.6 g/dL (ref 3.5–5.0)
Alkaline Phosphatase: 82 U/L (ref 38–126)
Anion gap: 8 (ref 5–15)
BUN: 28 mg/dL — ABNORMAL HIGH (ref 8–23)
CO2: 23 mmol/L (ref 22–32)
Calcium: 8.6 mg/dL — ABNORMAL LOW (ref 8.9–10.3)
Chloride: 105 mmol/L (ref 98–111)
Creatinine, Ser: 0.78 mg/dL (ref 0.61–1.24)
GFR calc Af Amer: >60 mL/min (ref >60)
GFR calc non Af Amer: >60 mL/min (ref >60)
Glucose, Bld: 165 mg/dL — ABNORMAL HIGH (ref 70–99)
Potassium: 4.2 mmol/L (ref 3.5–5.1)
Sodium: 136 mmol/L (ref 135–145)
Total Bilirubin: 1 mg/dL (ref 0.3–1.2)
Total Protein: 6.1 g/dL — ABNORMAL LOW (ref 6.5–8.1)

## 2019-10-28 LAB — CBC WITH DIFFERENTIAL/PLATELET
Abs Immature Granulocytes: 0.03 10*3/uL (ref 0.00–0.07)
Basophils Absolute: 0 10*3/uL (ref 0.0–0.1)
Basophils Relative: 0 %
Eosinophils Absolute: 0 10*3/uL (ref 0.0–0.5)
Eosinophils Relative: 0 %
HCT: 40.1 % (ref 39.0–52.0)
Hemoglobin: 14.2 g/dL (ref 13.0–17.0)
Immature Granulocytes: 0 %
Lymphocytes Relative: 6 %
Lymphs Abs: 0.5 10*3/uL — ABNORMAL LOW (ref 0.7–4.0)
MCH: 32.8 pg (ref 26.0–34.0)
MCHC: 35.4 g/dL (ref 30.0–36.0)
MCV: 92.6 fL (ref 80.0–100.0)
Monocytes Absolute: 0.3 10*3/uL (ref 0.1–1.0)
Monocytes Relative: 4 %
Neutro Abs: 7.6 10*3/uL (ref 1.7–7.7)
Neutrophils Relative %: 90 %
Platelets: 241 10*3/uL (ref 150–400)
RBC: 4.33 MIL/uL (ref 4.22–5.81)
RDW: 11.9 % (ref 11.5–15.5)
WBC: 8.4 10*3/uL (ref 4.0–10.5)
nRBC: 0 % (ref 0.0–0.2)

## 2019-10-28 LAB — GLUCOSE, CAPILLARY
Glucose-Capillary: 101 mg/dL — ABNORMAL HIGH (ref 70–99)
Glucose-Capillary: 210 mg/dL — ABNORMAL HIGH (ref 70–99)
Glucose-Capillary: 270 mg/dL — ABNORMAL HIGH (ref 70–99)
Glucose-Capillary: 76 mg/dL (ref 70–99)

## 2019-10-28 LAB — MAGNESIUM: Magnesium: 2.2 mg/dL (ref 1.7–2.4)

## 2019-10-28 LAB — PHOSPHORUS: Phosphorus: 3.4 mg/dL (ref 2.5–4.6)

## 2019-10-28 MED ORDER — ALBUTEROL SULFATE (2.5 MG/3ML) 0.083% IN NEBU
2.5000 mg | INHALATION_SOLUTION | RESPIRATORY_TRACT | Status: DC | PRN
  Administered 2019-10-28 – 2019-10-31 (×7): 2.5 mg via RESPIRATORY_TRACT
  Filled 2019-10-28 (×7): qty 3

## 2019-10-28 MED ORDER — BUDESONIDE 0.5 MG/2ML IN SUSP
0.5000 mg | Freq: Two times a day (BID) | RESPIRATORY_TRACT | Status: DC
  Administered 2019-10-28 – 2019-11-01 (×8): 0.5 mg via RESPIRATORY_TRACT
  Filled 2019-10-28 (×8): qty 2

## 2019-10-28 MED ORDER — HYDROXYZINE HCL 25 MG PO TABS
25.0000 mg | ORAL_TABLET | ORAL | Status: DC | PRN
  Administered 2019-10-29: 25 mg via ORAL
  Filled 2019-10-28: qty 1

## 2019-10-28 MED ORDER — FINASTERIDE 5 MG PO TABS
5.0000 mg | ORAL_TABLET | Freq: Every day | ORAL | Status: DC
  Administered 2019-10-28 – 2019-11-01 (×5): 5 mg via ORAL
  Filled 2019-10-28 (×5): qty 1

## 2019-10-28 MED ORDER — METHYLPREDNISOLONE SODIUM SUCC 125 MG IJ SOLR
60.0000 mg | Freq: Four times a day (QID) | INTRAMUSCULAR | Status: DC
  Administered 2019-10-28 – 2019-10-30 (×8): 60 mg via INTRAVENOUS
  Filled 2019-10-28 (×8): qty 2

## 2019-10-28 MED ORDER — LEVOFLOXACIN 750 MG PO TABS
750.0000 mg | ORAL_TABLET | Freq: Every day | ORAL | Status: AC
  Administered 2019-10-28 – 2019-11-01 (×5): 750 mg via ORAL
  Filled 2019-10-28 (×5): qty 1

## 2019-10-28 MED ORDER — BENZONATATE 100 MG PO CAPS
200.0000 mg | ORAL_CAPSULE | Freq: Three times a day (TID) | ORAL | Status: DC | PRN
  Administered 2019-10-28: 200 mg via ORAL
  Filled 2019-10-28 (×2): qty 2

## 2019-10-28 NOTE — Progress Notes (Signed)
PROGRESS NOTE    James [name]  [id number] DOB: [birth year] DOA: 10/26/2019 PCP: [name], MD   Brief Narrative:  HPI per Dr. Truddie Hidden on 10/26/2019 James [name] is a 64 y.o. male with medical history significant of asthma, CAD, T1DM, HLD, GERD, and Depression who presents with worsening shortness of breath.  Patient reports symptom onset Monday with worsening by Tuesday.  He reports he called and to pulmonology and he did not have any appointments but did prescribe him prednisone with taper as well as azithromycin until a potential follow-up appointment on Monday.  He states despite this he has had progressive symptoms, predominantly shortness of breath and dyspnea on exertion.  He has had a sore throat and productive cough which was more prominent earlier in the course and now is mostly dry with occasional clear sputum production.  He denies any fevers or chills, has chest pain only after serious coughing spells.  He states his coughing spells can be quite severe.  He states he is no longer smoking.  He is on Chantix.  He has been utilizing his inhalers more frequently, several times a [place].  No nausea, vomiting or diarrhea.  No loss of smell or taste.  No rashes.  He lives with his wife.  At baseline is independent in his ADLs.  **Interim History  Respiratory status was still tenuous and states that whenever he ambulates he becomes very short of breath.  States that this happens about 2 times a year.  Breathing treatments were adjusted and patient feels a little bit better but still has some progress to make.  He became acutely dyspneic and started coughing violently and could not catch of breath.  Respiratory virus panel still pending and repeat chest x-ray shows chronic bronchitis.  His medications were adjusted and he was placed on Levaquin today and his steroids were increased as well as budesonide.  He was also given Tessalon Perles for his cough suppression.  Assessment & Plan:   Active Problems:   HLD (hyperlipidemia)   CAD (coronary artery disease)   Chronic asthmatic bronchitis (HCC)   Cough   TOBACCO USE, QUIT   Tobacco abuse   DM type 1 (diabetes mellitus, type 1) (HCC)   Asthma exacerbation   Acute asthma exacerbation   Acute Hypoxic Respiratory Failure in the setting of Acute Exacerbation of Asthma -Admitted as Inpatient  -Rapid COVID Negative along with Influenza A/B via PCR but will check a official respiratory virus panel -CXR showed "The heart size and mediastinal contours are within normal limits. Both lungs are clear. The visualized skeletal structures are unremarkable." -PFTs last year were not c/w COPD; precipitant likely pollen this time -Will finish azithromycin course, does appear to have bronchitis; since he did not improve will switch to levofloxacin as patient states that this works well for him -Received solumedrol 125 mg en route and given another dose in the ED and was continued on 40 mg daily of po Prednisone  (prior to admission patient had received prednisone 40 mg x 3 days) but will stop and started IV Solumedrol 60 mg q12h but because patient was still wheezing significantly have increase this to 60 mg IV q. 6 scheduled -Stop is Breo Ellipta and start budesonide and Brovana nebs; increase budesonide dosing to 0.5 twice daily today Stopped albuterol 3 times daily scheduled and will start Xopenex/Atrovent every 6 scheduled but this was changed to DuoNeb's 3 times daily by the respiratory therapist -Continue with albuterol as needed  2.5 mg neb every 6 as needed for wheezing but will change to every 4 hours to the every 6 -Obtain peak flow meter monitoring as well as incentive spirometer, flutter valve next-we will start guaifenesin 1200 mg p.o. twice daily -Continuous pulse oximetry and maintain O2 saturation greater than 90% -Continue supplemental oxygen via nasal cannula and wean O2 as tolerated -We will need an ambulatory home O2 screen  prior to discharge -Continue antitussives with hydrocodone-homatroprine 5 mL p.o. every 6 hours as needed for cough and will add 200 mg of Tessalon Perles given that he continues to violently cough -Continue with Loratadine 10 mg p.o. daily -If not improving or fails to improve significantly will obtain a chest CTA to rule out pulmonary embolus and consult pulmonary for further evaluation recommendations  -Rrepeat chest x-ray intermittently and repeat this a.m. showed Chronic bronchitic markings.  No acute findings. -Patient does still sound extremely terrible on auscultation of his lungs today. -Continue to monitor respiratory status carefully patient had a coughing spell today and could not catch his breath and had very violent coughing.  Well-controlled Type 1 Diabetes Mellitus -Continue insulin pump in house with diabetes education coordinator to assist -Will schedule accuchecks -If he has issues with pump, patient can turn off and we'd need to transition to Basal Bolus regimen -Presently with moderate dose ISS -CBGs ranging from 101-270; sugar this morning on BMP was 165 -Diabetes coordinator consult placed and diabetes education "I recommended that if blood sugars begin to rise with steroids may consider stopping insulin pump off of auto mode in terms of manual mode and will do a temporary basal of 125%  CAD -Continue Simvastatin, not on aspirin presently, hx in chart of CAD dating to 2008 -Now ordered for Aspirin 81 mg po Daily   HLD -Continue with Simvastatin 40 mg p.o. nightly  Tobacco Use Disorder -Smoking cessation counseling given and patient confirmed that he is no longer walking -Continue Varenicline 1 mg po BID along with Nicotine Polacrilex 2 mg po PRN Smoking Cessastion  Anxiety -No longer taking Hydroxyzine 1 tablet every 4 hours as needed for anxiety but will restart -Continue to Monitor   Overweight -Estimated body mass index is 25.84 kg/m as calculated from  the following:   Height as of an earlier encounter on 10/26/19: 5\' 9"  (1.753 m).   Weight as of an earlier encounter on 10/26/19: 79.4 kg. -Weight Loss and Dietary Counseling given   Elevated BUN -Likely dissociated from creatinine in the setting of steroid demargination -Patient's BUN was 28 -Continue monitor and will continue monitor for signs and symptoms of upper GI bleeding  -Repeat CMP in a.m.  DVT prophylaxis: Enoxaparin 40 mg sq q24h Code Status: FULL CODE  Family Communication: No family present at bedside  Disposition Plan: Patient is from home and will need further clinical improvement of his respiratory status and be able to be weaned off of oxygen prior to safe discharge disposition which is likely back to home  Consultants:   None  Procedures:   None  Antimicrobials:  Anti-infectives (From admission, onward)   Start     Dose/Rate Route Frequency Ordered Stop   10/28/19 1200  levofloxacin (LEVAQUIN) tablet 750 mg     750 mg Oral Daily 10/28/19 1105     10/26/19 1400  azithromycin (ZITHROMAX) tablet 250 mg    Note to Pharmacy: Zpack taper as directed     250 mg Oral Daily 10/26/19 1351 10/27/19 1021     Subjective:  Seen and examined at bedside and states that he is feeling a little bit better but not very much.  States that he still coughing significantly.  Had a very violent coughing episode while there he could not catch his breath.  States that he still feels pretty terrible.  No nausea or vomiting.  Denies any chest pain.  No other concerns or complaints this time.  Objective: Vitals:   10/28/19 0455 10/28/19 0732 10/28/19 1113 10/28/19 1130  BP: (!) 148/73     Pulse: 75 74 (!) 105 88  Resp: 20 18 (!) 24 20  Temp: 98 F (36.7 C)     TempSrc: Oral     SpO2: 93% 94% 94% 96%    Intake/Output Summary (Last 24 hours) at 10/28/2019 1347 Last data filed at 10/28/2019 1030 Gross per 24 hour  Intake 840 ml  Output 725 ml  Net 115 ml   There were no vitals  filed for this visit.  Examination: Physical Exam:  Constitutional: WN/WD overweight Caucasian male currently in some respiratory distress appears extremely anxious and he had a witnessed very violent coughing episode and could not catch his breath and turned red Eyes: Lids and conjunctivae normal, sclerae anicteric  ENMT: External Ears, Nose appear normal. Grossly normal hearing. Neck: Appears normal, supple, no cervical masses, normal ROM, no appreciable thyromegaly; no JVD Respiratory: Diminished to auscultation bilaterally with coarse breath sounds and some rhonchi and along with significant wheezing.  He is wearing 2 L of supplemental oxygen via nasal cannula and was slightly tachypneic and when he had his coughing episode became extremely short of breath Cardiovascular: RRR, no murmurs / rubs / gallops. S1 and S2 auscultated.  Abdomen: Soft, non-tender, distended second body habitus. Bowel sounds positive.  GU: Deferred. Musculoskeletal: No clubbing / cyanosis of digits/nails. No joint deformity upper and lower extremities. Skin: No rashes, lesions, ulcers on limited skin evaluation. No induration; Warm and dry.  Neurologic: CN 2-12 grossly intact with no focal deficits. Romberg sign and cerebellar reflexes not assessed.  Psychiatric: Normal judgment and insight. Alert and oriented x 3.  Has a somewhat anxious mood and appropriate affect.   Data Reviewed: I have personally reviewed following labs and imaging studies  CBC: Recent Labs  Lab 10/26/19 1019 10/27/19 0546 10/28/19 0550  WBC 5.8 6.4 8.4  NEUTROABS 4.1  --  7.6  HGB 13.5 14.4 14.2  HCT 38.2* 40.6 40.1  MCV 92.9 92.3 92.6  PLT 233 268 [id number]   Basic Metabolic Panel: Recent Labs  Lab 10/26/19 1019 10/27/19 0546 10/28/19 0550  NA 138 138 136  K 3.5 4.4 4.2  CL 107 104 105  CO2 23 24 23   GLUCOSE 223* 164* 165*  BUN 16 22 28*  CREATININE 0.70 0.75 0.78  CALCIUM 8.4* 8.9 8.6*  MG  --   --  2.2  PHOS  --   --  3.4    GFR: Estimated Creatinine Clearance: 93.3 mL/min (by C-G formula based on SCr of 0.78 mg/dL). Liver Function Tests: Recent Labs  Lab 10/28/19 0550  AST 13*  ALT 15  ALKPHOS 82  BILITOT 1.0  PROT 6.1*  ALBUMIN 3.6   No results for input(s): LIPASE, AMYLASE in the last 168 hours. No results for input(s): AMMONIA in the last 168 hours. Coagulation Profile: No results for input(s): INR, PROTIME in the last 168 hours. Cardiac Enzymes: No results for input(s): CKTOTAL, CKMB, CKMBINDEX, TROPONINI in the last 168 hours. BNP (last 3 results) No  results for input(s): PROBNP in the last 8760 hours. HbA1C: Recent Labs    10/26/19 1019  HGBA1C 6.5*   CBG: Recent Labs  Lab 10/27/19 2000 10/27/19 2327 10/28/19 0346 10/28/19 0729 10/28/19 1143  GLUCAP 248* 113* 101* 210* 270*   Lipid Profile: No results for input(s): CHOL, HDL, LDLCALC, TRIG, CHOLHDL, LDLDIRECT in the last 72 hours. Thyroid Function Tests: No results for input(s): TSH, T4TOTAL, FREET4, T3FREE, THYROIDAB in the last 72 hours. Anemia Panel: No results for input(s): VITAMINB12, FOLATE, FERRITIN, TIBC, IRON, RETICCTPCT in the last 72 hours. Sepsis Labs: No results for input(s): PROCALCITON, LATICACIDVEN in the last 168 hours.  Recent Results (from the past 240 hour(s))  Respiratory Panel by RT PCR (Flu A&B, Covid) - Nasopharyngeal Swab     Status: None   Collection Time: 10/26/19 10:14 AM   Specimen: Nasopharyngeal Swab  Result Value Ref Range Status   SARS Coronavirus 2 by RT PCR NEGATIVE NEGATIVE Final    Comment: (NOTE) SARS-CoV-2 target nucleic acids are NOT DETECTED. The SARS-CoV-2 RNA is generally detectable in upper respiratoy specimens during the acute phase of infection. The lowest concentration of SARS-CoV-2 viral copies this assay can detect is 131 copies/mL. A negative result does not preclude SARS-Cov-2 infection and should not be used as the sole basis for treatment or other patient management  decisions. A negative result may occur with  improper specimen collection/handling, submission of specimen other than nasopharyngeal swab, presence of viral mutation(s) within the areas targeted by this assay, and inadequate number of viral copies (<131 copies/mL). A negative result must be combined with clinical observations, patient history, and epidemiological information. The expected result is Negative. Fact Sheet for Patients:  PinkCheek.be Fact Sheet for Healthcare Providers:  GravelBags.it This test is not yet ap proved or cleared by the [place] FDA and  has been authorized for detection and/or diagnosis of SARS-CoV-2 by FDA under an Emergency Use Authorization (EUA). This EUA will remain  in effect (meaning this test can be used) for the duration of the COVID-19 declaration under Section 564(b)(1) of the Act, 21 U.S.C. section 360bbb-3(b)(1), unless the authorization is terminated or revoked sooner.    Influenza A by PCR NEGATIVE NEGATIVE Final   Influenza B by PCR NEGATIVE NEGATIVE Final    Comment: (NOTE) The Xpert Xpress SARS-CoV-2/FLU/RSV assay is intended as an aid in  the diagnosis of influenza from Nasopharyngeal swab specimens and  should not be used as a sole basis for treatment. Nasal washings and  aspirates are unacceptable for Xpert Xpress SARS-CoV-2/FLU/RSV  testing. Fact Sheet for Patients: PinkCheek.be Fact Sheet for Healthcare Providers: GravelBags.it This test is not yet approved or cleared by the [place] FDA and  has been authorized for detection and/or diagnosis of SARS-CoV-2 by  FDA under an Emergency Use Authorization (EUA). This EUA will remain  in effect (meaning this test can be used) for the duration of the  Covid-19 declaration under Section 564(b)(1) of the Act, 21  U.S.C. section 360bbb-3(b)(1), unless the authorization  is  terminated or revoked. Performed at [facility], [place] [address], [place], [place] [address]     RN Pressure Injury Documentation:     Estimated body mass index is 25.84 kg/m as calculated from the following:   Height as of an earlier encounter on 10/26/19: 5\' 9"  (1.753 m).   Weight as of an earlier encounter on 10/26/19: 79.4 kg.  Malnutrition Type:      Malnutrition Characteristics:  Nutrition Interventions:    Radiology Studies: DG CHEST PORT 1 VIEW  Result Date: 10/28/2019 CLINICAL DATA:  Short of breath EXAM: PORTABLE CHEST 1 VIEW COMPARISON:  None. FINDINGS: Normal mediastinum and cardiac silhouette. Chronic central bronchitic markings. Normal pulmonary vasculature. No effusion, infiltrate, or pneumothorax. Anterior cervical fusion IMPRESSION: Chronic bronchitic markings.  No acute findings. Electronically Signed   By: Suzy Bouchard M.D.   On: 10/28/2019 06:51   Scheduled Meds: . arformoterol  15 mcg Nebulization BID  . aspirin  81 mg Oral Daily  . budesonide (PULMICORT) nebulizer solution  0.5 mg Nebulization BID  . enoxaparin (LOVENOX) injection  40 mg Subcutaneous Q24H  . guaiFENesin  1,200 mg Oral BID  . ipratropium-albuterol  3 mL Nebulization TID  . levofloxacin  750 mg Oral Daily  . loratadine  10 mg Oral Daily  . methylPREDNISolone (SOLU-MEDROL) injection  60 mg Intravenous Q6H  . simvastatin  40 mg Oral QHS  . traZODone  150 mg Oral QHS  . varenicline  1 mg Oral BID   Continuous Infusions:   LOS: 2 days   Kerney Elbe, DO [facility] PAGER is on AMION  If 7PM-7AM, please contact night-coverage [email or web address]

## 2019-10-28 NOTE — Progress Notes (Signed)
Pharmacy Antibiotic Note  James [name] is a 64 y.o. male with hx COPD presented to the ED on 10/26/2019 with c/o SOB. Home regimen of azithromycin was resumed on admission. Pharmacy was consulted on 4/11 to change abx to levaquin for CAP.  Plan: - levaquin 750 mg PO q24h  ________________________________  Temp (24hrs), Avg:98.3 F (36.8 C), Min:98 F (36.7 C), Max:98.5 F (36.9 C)  Recent Labs  Lab 10/26/19 1019 10/27/19 0546 10/28/19 0550  WBC 5.8 6.4 8.4  CREATININE 0.70 0.75 0.78    Estimated Creatinine Clearance: 93.3 mL/min (by C-G formula based on SCr of 0.78 mg/dL).    Allergies  Allergen Reactions  . Augmentin [Amoxicillin-Pot Clavulanate] Nausea And Vomiting and Other (See Comments)    Did it involve swelling of the face/tongue/throat, SOB, or low BP? No Did it involve sudden or severe rash/hives, skin peeling, or any reaction on the inside of your mouth or nose? No Did you need to seek medical attention at a hospital or doctor's office? No When did it last happen?5+ years If all above answers are "NO", may proceed with cephalosporin use.   [name] [Escitalopram Oxalate] Itching     Thank you for allowing pharmacy to be a part of this patient's care.  Lynelle Doctor 10/28/2019 11:02 AM

## 2019-10-29 ENCOUNTER — Inpatient Hospital Stay (HOSPITAL_COMMUNITY): Payer: 59

## 2019-10-29 ENCOUNTER — Ambulatory Visit: Payer: 59 | Admitting: Primary Care

## 2019-10-29 DIAGNOSIS — J4551 Severe persistent asthma with (acute) exacerbation: Secondary | ICD-10-CM | POA: Diagnosis not present

## 2019-10-29 DIAGNOSIS — J45901 Unspecified asthma with (acute) exacerbation: Secondary | ICD-10-CM

## 2019-10-29 DIAGNOSIS — J449 Chronic obstructive pulmonary disease, unspecified: Secondary | ICD-10-CM | POA: Diagnosis not present

## 2019-10-29 DIAGNOSIS — I2510 Atherosclerotic heart disease of native coronary artery without angina pectoris: Secondary | ICD-10-CM | POA: Diagnosis not present

## 2019-10-29 LAB — COMPREHENSIVE METABOLIC PANEL
ALT: 13 U/L (ref 0–44)
AST: 12 U/L — ABNORMAL LOW (ref 15–41)
Albumin: 3.6 g/dL (ref 3.5–5.0)
Alkaline Phosphatase: 76 U/L (ref 38–126)
Anion gap: 10 (ref 5–15)
BUN: 31 mg/dL — ABNORMAL HIGH (ref 8–23)
CO2: 23 mmol/L (ref 22–32)
Calcium: 8.3 mg/dL — ABNORMAL LOW (ref 8.9–10.3)
Chloride: 101 mmol/L (ref 98–111)
Creatinine, Ser: 0.83 mg/dL (ref 0.61–1.24)
GFR calc Af Amer: >60 mL/min (ref >60)
GFR calc non Af Amer: >60 mL/min (ref >60)
Glucose, Bld: 175 mg/dL — ABNORMAL HIGH (ref 70–99)
Potassium: 4 mmol/L (ref 3.5–5.1)
Sodium: 134 mmol/L — ABNORMAL LOW (ref 135–145)
Total Bilirubin: 0.4 mg/dL (ref 0.3–1.2)
Total Protein: 5.9 g/dL — ABNORMAL LOW (ref 6.5–8.1)

## 2019-10-29 LAB — CBC WITH DIFFERENTIAL/PLATELET
Abs Immature Granulocytes: 0.04 10*3/uL (ref 0.00–0.07)
Basophils Absolute: 0 10*3/uL (ref 0.0–0.1)
Basophils Relative: 0 %
Eosinophils Absolute: 0 10*3/uL (ref 0.0–0.5)
Eosinophils Relative: 0 %
HCT: 38.4 % — ABNORMAL LOW (ref 39.0–52.0)
Hemoglobin: 13.3 g/dL (ref 13.0–17.0)
Immature Granulocytes: 0 %
Lymphocytes Relative: 5 %
Lymphs Abs: 0.5 10*3/uL — ABNORMAL LOW (ref 0.7–4.0)
MCH: 31.8 pg (ref 26.0–34.0)
MCHC: 34.6 g/dL (ref 30.0–36.0)
MCV: 91.9 fL (ref 80.0–100.0)
Monocytes Absolute: 0.3 10*3/uL (ref 0.1–1.0)
Monocytes Relative: 3 %
Neutro Abs: 8.6 10*3/uL — ABNORMAL HIGH (ref 1.7–7.7)
Neutrophils Relative %: 92 %
Platelets: 270 10*3/uL (ref 150–400)
RBC: 4.18 MIL/uL — ABNORMAL LOW (ref 4.22–5.81)
RDW: 11.9 % (ref 11.5–15.5)
WBC: 9.4 10*3/uL (ref 4.0–10.5)
nRBC: 0 % (ref 0.0–0.2)

## 2019-10-29 LAB — GLUCOSE, CAPILLARY
Glucose-Capillary: 166 mg/dL — ABNORMAL HIGH (ref 70–99)
Glucose-Capillary: 215 mg/dL — ABNORMAL HIGH (ref 70–99)
Glucose-Capillary: 169 mg/dL — ABNORMAL HIGH (ref 70–99)
Glucose-Capillary: 145 mg/dL — ABNORMAL HIGH (ref 70–99)
Glucose-Capillary: 215 mg/dL — ABNORMAL HIGH (ref 70–99)
Glucose-Capillary: 80 mg/dL (ref 70–99)
Glucose-Capillary: 140 mg/dL — ABNORMAL HIGH (ref 70–99)

## 2019-10-29 LAB — MAGNESIUM: Magnesium: 2.3 mg/dL (ref 1.7–2.4)

## 2019-10-29 LAB — PHOSPHORUS: Phosphorus: 3.5 mg/dL (ref 2.5–4.6)

## 2019-10-29 MED ORDER — PANTOPRAZOLE SODIUM 40 MG IV SOLR
40.0000 mg | Freq: Two times a day (BID) | INTRAVENOUS | Status: DC
  Administered 2019-10-29 – 2019-10-31 (×5): 40 mg via INTRAVENOUS
  Filled 2019-10-29 (×5): qty 40

## 2019-10-29 MED ORDER — SODIUM CHLORIDE (PF) 0.9 % IJ SOLN
INTRAMUSCULAR | Status: AC
  Filled 2019-10-29: qty 50

## 2019-10-29 MED ORDER — IOHEXOL 350 MG/ML SOLN
100.0000 mL | Freq: Once | INTRAVENOUS | Status: AC | PRN
  Administered 2019-10-29: 100 mL via INTRAVENOUS

## 2019-10-29 NOTE — Consult Note (Signed)
NAME:  James [name], MRN:  [id number], DOB:  [birth year], LOS: 3 ADMISSION DATE:  10/26/2019, CONSULTATION DATE:  4/12 REFERRING MD:  Alfredia Ferguson, CHIEF COMPLAINT:  Dyspnea and asthmatic exacerbation    Brief History   64 year old male with history of asthmatic bronchitis admitted on 4/9 with asthmatic exacerbation Pulmonary asked to evaluate on 4/12 as continued to demonstrate significant symptom burden in spite of systemic steroids, inhaled bronchodilators and inhaled corticosteroids as well as empiric antibiotics  History of present illness   64 year old  male typically followed by Dr. Elsworth Soho with history of chronic asthmatic bronchitis, and allergic rhinitis.  Initially called our office on 4/5 with chief complaint of several days of worsening shortness of breath over approximately 7 to 10 days.  He endorsed cough, nasal discharge and worsening shortness of breath.  He had not been using his inhalers regularly, was prescribed with prednisone taper and a azithromycin on 4/5 On 4/8 felt like he was getting worse, shortness of breath was worse.  Still complaining of burning running nose, worsening shortness of breath and wheezing as well as body aches.  He had received his second Covid vaccine on 4/7 He presented to the emergency room on 4/9 with chief complaint of worsening shortness of breath, he did endorse severe coughing spells, these were essentially nonproductive.  Worsening wheezing in addition.   Was admitted with working diagnosis of asthmatic exacerbation : Therapeutic interventions included a azithromycin, continuing LABA/ICS, and placing him on systemic steroids. On 4/10 he continued to report significant shortness of breath, Solu-Medrol was changed to 60 every 12, he was started on budesonide and Brovana nebulized in place of Breo Ellipta.  Also started on scheduled Xopenex and Atrovent.  In addition to this he was started on antihistamine and antitussive therapy.  On 4/11 he was still quite short  of breath antibiotics were changed to Levaquin. On 4/12 he continued to demonstrate significant dyspnea and because of this pulmonary asked to evaluate  Past Medical History  Asthma, gastroesophageal reflux disease, hyperlipidemia, coronary artery disease, type 1 diabetes. Former smoker Had a history of not taking medications due to cost restraints Last documented exacerbations April 2020, February 2020, December 2020.  Significant Hospital Events   4/9 admitted. Started on steroids and abx cont. Cont BDs 4/10 steroids increased 4/11 added levaquin  4/12 pulm consulted.  Consults:  pulm consulted.   Procedures:    Significant Diagnostic Tests:  CT chest 4/12>>>  Micro Data:    Antimicrobials:  azith 4/5>>4/11 levaquin 4/11>>>  Interim history/subjective:    Objective   Blood pressure 126/60, pulse 79, temperature 97.7 F (36.5 C), temperature source Oral, resp. rate 20, SpO2 93 %.        Intake/Output Summary (Last 24 hours) at 10/29/2019 1221 Last data filed at 10/28/2019 1821 Gross per 24 hour  Intake 720 ml  Output no documentation  Net 720 ml   There were no vitals filed for this visit.  Examination: General: 64 year old white male. Resting in bed not in distress HENT: NCAT post pharynx erythremic, no drainage or dc, marked Upper airway wheezing and turbulent breath sounds Lungs: exp wheeze Cardiovascular: RRR Abdomen: soft Extremities: no edema Neuro: oriented and w/out focal def  GU: voids  Resolved Hospital Problem list     Assessment & Plan:   Asthmatic exacerbation, has history of allergic rhinitis, suspect likely exacerbated by seasonal allergies  discussion -Agree, given treatment he has been provided with thus far would expect some clinical  improvement if this was only his asthma -Portable chest x-ray personally reviewed, negative for pneumonia or airspace disease -seems like on-going symptom burden likely a combo of Post-nasal gtt and  reflux (reflux being the greater of the two)  Plan/recommendation  Reasonable to proceed with CT chest to rule out pulmonary emboli Check BNP Scheduled Brovana and budesonide Continue scheduled DuoNeb Agree with daily Claritin Change prednisone to 60 mg every 12, every 6 hour dosing unlikely to help him and will certainly exacerbate his hyperglycemia [place] #2 Levaquin, not certain he needs this however might be reasonable if has sinusitis Add PPI BID Reflux precautions  Labs   CBC: Recent Labs  Lab 10/26/19 1019 10/27/19 0546 10/28/19 0550 10/29/19 0521  WBC 5.8 6.4 8.4 9.4  NEUTROABS 4.1  --  7.6 8.6*  HGB 13.5 14.4 14.2 13.3  HCT 38.2* 40.6 40.1 38.4*  MCV 92.9 92.3 92.6 91.9  PLT 233 268 241 [id number]    Basic Metabolic Panel: Recent Labs  Lab 10/26/19 1019 10/27/19 0546 10/28/19 0550 10/29/19 0521  NA 138 138 136 134*  K 3.5 4.4 4.2 4.0  CL 107 104 105 101  CO2 23 24 23 23   GLUCOSE 223* 164* 165* 175*  BUN 16 22 28* 31*  CREATININE 0.70 0.75 0.78 0.83  CALCIUM 8.4* 8.9 8.6* 8.3*  MG  --   --  2.2 2.3  PHOS  --   --  3.4 3.5   GFR: Estimated Creatinine Clearance: 89.9 mL/min (by C-G formula based on SCr of 0.83 mg/dL). Recent Labs  Lab 10/26/19 1019 10/27/19 0546 10/28/19 0550 10/29/19 0521  WBC 5.8 6.4 8.4 9.4    Liver Function Tests: Recent Labs  Lab 10/28/19 0550 10/29/19 0521  AST 13* 12*  ALT 15 13  ALKPHOS 82 76  BILITOT 1.0 0.4  PROT 6.1* 5.9*  ALBUMIN 3.6 3.6   No results for input(s): LIPASE, AMYLASE in the last 168 hours. No results for input(s): AMMONIA in the last 168 hours.  ABG    Component Value Date/Time   HCO3 22.0 04/11/2013 0749   TCO2 20.0 04/11/2013 0749   ACIDBASEDEF 3.1 (H) 04/11/2013 0749   O2SAT 51.4 04/11/2013 0749     Coagulation Profile: No results for input(s): INR, PROTIME in the last 168 hours.  Cardiac Enzymes: No results for input(s): CKTOTAL, CKMB, CKMBINDEX, TROPONINI in the last 168  hours.  HbA1C: Hgb A1c MFr Bld  Date/Time Value Ref Range Status  10/26/2019 10:19 AM 6.5 (H) 4.8 - 5.6 % Final    Comment:    (NOTE)         Prediabetes: 5.7 - 6.4         Diabetes: >6.4         Glycemic control for adults with diabetes: <7.0   07/06/2019 10:32 AM 6.9 (H) 4.8 - 5.6 % Final    Comment:    (NOTE) Pre diabetes:          5.7%-6.4% Diabetes:              >6.4% Glycemic control for   <7.0% adults with diabetes     CBG: Recent Labs  Lab 10/28/19 2026 10/29/19 0010 10/29/19 0421 10/29/19 0736 10/29/19 1158  GLUCAP 166* 215* 169* 145* 215*    Review of Systems:     Past Medical History  He,  has a past medical history of ANXIETY (04/03/2007), Anxiety, ASTHMA (09/06/2008), Asthma, ASTHMATIC BRONCHITIS, ACUTE (10/25/2008), Bladder neck obstruction, CARPAL TUNNEL SYNDROME, BILATERAL (  07/31/2007), Cervical disc disease, Chronic bronchitis ([place]), COPD (chronic obstructive pulmonary disease) ([place]), CORONARY ARTERY DISEASE (04/03/2007), DEPRESSION (04/03/2007), Depression, DIABETES MELLITUS, TYPE I (04/03/2007), Diabetic retinopathy associated with diabetes mellitus due to underlying condition ([place]) (04/03/2007), DM W/EYE MANIFESTATIONS, TYPE I, UNCONTROLLED (04/04/2007), DM W/RENAL MNFST, TYPE I, UNCONTROLLED (04/04/2007), ED (erectile dysfunction), History of kidney stones, HYPERLIPIDEMIA (04/04/2007), Pneumonia, Renal insufficiency, Seizures ([place]), and Spinal stenosis.   Surgical History    Past Surgical History:  Procedure Laterality Date  . ANTERIOR CERVICAL DECOMP/DISCECTOMY FUSION  2000   "couple screws and a plate"  . ANTERIOR CERVICAL DECOMP/DISCECTOMY FUSION N/A 06/24/2016   Procedure: ANTERIOR CERVICAL DECOMPRESSION/DISCECTOMY FUSION CERVICAL FOUR - CERVICAL FIVE, CERVICAL FIVE - CERVICAL SIX; REMOVAL TETHER CERVICAL PLATE;  Surgeon: Jovita Gamma, MD;  Location: [facility];  Service: Neurosurgery;  Laterality: N/A;  ANTERIOR CERVICAL DECOMPRESSION/DISCECTOMY FUSION  CERVICAL FOUR - CERVICAL FIVE, CERVICAL FIVE - CERVICAL SIX; REMOVAL TETHER CERVICAL PLATE  . APPENDECTOMY    . BACK SURGERY    . CARDIAC CATHETERIZATION  1990's  . CATARACT EXTRACTION W/ INTRAOCULAR LENS  IMPLANT, BILATERAL Bilateral   . CYSTOSCOPY WITH RETROGRADE PYELOGRAM, URETEROSCOPY AND STENT PLACEMENT Bilateral 04/06/2013   Procedure: BILATERAL CYSTOSCOPY WITH RETROGRADE PYELOGRAMS, STENT PLACEMENTS AND LEFT URETEROSCOPY AND STONE REMOVAL;  Surgeon: Alexis Frock, MD;  Location: [facility];  Service: Urology;  Laterality: Bilateral;  . CYSTOSCOPY WITH STENT PLACEMENT Right 04/12/2013   Procedure: CYSTOSCOPY WITH STENT PLACEMENT;  Surgeon: Alexis Frock, MD;  Location: [facility];  Service: Urology;  Laterality: Right;  . CYSTOSCOPY/RETROGRADE/URETEROSCOPY Bilateral 04/12/2013   Procedure: CYSTOSCOPY/RETROGRADE/URETEROSCOPY;  Surgeon: Alexis Frock, MD;  Location: [facility];  Service: Urology;  Laterality: Bilateral;  RIGHT RETROGRADE   . HOLMIUM LASER APPLICATION Left [id number]   Procedure: HOLMIUM LASER APPLICATION;  Surgeon: Alexis Frock, MD;  Location: [facility];  Service: Urology;  Laterality: Left;  . LUMBAR LAMINECTOMY/DECOMPRESSION MICRODISCECTOMY N/A 04/20/2019   Procedure: Lumbar microdisectomy and decompression L5-S1 left;  Surgeon: Latanya Maudlin, MD;  Location: [facility];  Service: Orthopedics;  Laterality: N/A;  53min  . LYMPH NODE DISSECTION  ~ 1960   groin  . stress cardiolite  09/06/2002  . TONSILLECTOMY    . VITRECTOMY Bilateral      Social History   reports that he quit smoking about 2 months ago. His smoking use included cigarettes. He smoked 0.00 packs per [place]. He has never used smokeless tobacco. He reports that he does not drink alcohol or use drugs.   Family History   His family history includes Cancer in his mother; Diabetes in his brother; Heart disease in his brother; Stroke in his father. There is no history of Colon cancer.   Allergies Allergies  Allergen Reactions   . Augmentin [Amoxicillin-Pot Clavulanate] Nausea And Vomiting and Other (See Comments)    Did it involve swelling of the face/tongue/throat, SOB, or low BP? No Did it involve sudden or severe rash/hives, skin peeling, or any reaction on the inside of your mouth or nose? No Did you need to seek medical attention at a hospital or doctor's office? No When did it last happen?5+ years If all above answers are "NO", may proceed with cephalosporin use.   [name] [Escitalopram Oxalate] Itching     Home Medications  Prior to Admission medications   Medication Sig Start Date End Date Taking? Authorizing Provider  acetaminophen (TYLENOL) 500 MG tablet Take 1,000 mg by mouth every 6 (six) hours as needed for mild pain or headache.   Yes [provider]  albuterol (PROVENTIL) (2.5 MG/3ML) 0.083% nebulizer solution USE 1 VIAL VIA NEBULIZER EVERY 6 HOURS AS NEEDED FOR WHEEZING OR SHORTNESS OF BREATH Patient taking differently: Take 2.5 mg by nebulization every 6 (six) hours as needed for wheezing or shortness of breath.  06/26/19  Yes Rigoberto Noel, MD  albuterol (VENTOLIN HFA) 108 (90 Base) MCG/ACT inhaler INHALE 2 PUFFS BY MOUTH INTO THE LUNGS EVERY 6 HOURS AS NEEDED FOR WHEEZING OR SHORTNESS OF BREATH. Patient taking differently: Inhale 2 puffs into the lungs every 6 (six) hours as needed for wheezing or shortness of breath.  06/11/19  Yes Parrett, Tammy S, NP  azithromycin (ZITHROMAX) 250 MG tablet Zpack taper as directed Patient taking differently: Take 250-500 mg by mouth See admin instructions. 500mg  on [place] one 250mg  on [place] 2-5 10/22/19  Yes Martyn Ehrich, NP  fexofenadine (ALLEGRA) 180 MG tablet Take 180 mg by mouth daily.   Yes [provider]  finasteride (PROSCAR) 5 MG tablet Take 5 mg by mouth daily.   Yes [provider]  Fluticasone-Salmeterol (ADVAIR DISKUS) 100-50 MCG/DOSE AEPB Inhale 1 puff into the lungs 2 (two) times daily. 08/13/19  Yes Martyn Ehrich, NP  Multiple Vitamins-Minerals (AIRBORNE PO) Take 3 tablets by mouth daily.   Yes [provider]  polyvinyl alcohol (LIQUIFILM TEARS) 1.4 % ophthalmic solution Place 1 drop into both eyes as needed for dry eyes.   Yes [provider]  predniSONE (DELTASONE) 10 MG tablet Take 4 tabs po daily x 3 days; then 3 tabs daily x3 days; then 2 tabs daily x3 days; then 1 tab daily x 3 days; then stop Patient taking differently: Take 10-40 mg by mouth See admin instructions. Take 40mg  po daily x 3 days; then 30mg  daily x3 days; then 20mg  daily x3 days; then 10mg  daily x 3 days; then stop 10/22/19  Yes Martyn Ehrich, NP  simvastatin (ZOCOR) 40 MG tablet Take 40 mg by mouth at bedtime.    Yes [provider]  traZODone (DESYREL) 150 MG tablet Take 150 mg by mouth at bedtime.   Yes [provider]  varenicline (CHANTIX CONTINUING MONTH PAK) 1 MG tablet Take 1 tablet (1 mg total) by mouth 2 (two) times daily. 09/17/19  Yes Martyn Ehrich, NP  HYDROcodone-homatropine [facility]) 5-1.5 MG/5ML syrup Take 5 mLs by mouth every 6 (six) hours as needed for cough. 10/26/19   Martyn Ehrich, NP  hydrOXYzine (VISTARIL) 25 MG capsule Take 1 capsule (25 mg total) by mouth every 4 (four) hours as needed for anxiety. Patient not taking: Reported on 08/13/2019 07/08/19   Donne Hazel, MD  insulin lispro (HUMALOG) 100 UNIT/ML KwikPen Junior Inject 0.05 mLs (5 Units total) into the skin 3 (three) times daily before meals. 07/08/19   Donne Hazel, MD  insulin lispro (HUMALOG) 100 UNIT/ML KwikPen Junior Sliding scale: CBG 70 - 120: 0 units  CBG 121 - 150: 2 units  CBG 151 - 200: 3 units  CBG 201 - 250: 5 units  CBG 251 - 300: 8 units  CBG 301 - 350: 11 units  CBG 351 - 400: 15 units 07/08/19   Donne Hazel, MD  Insulin Pen Needle 31G X 5 MM MISC 1 Device by Does not apply route 4 (four) times daily. For use with insulin pens 07/08/19   Donne Hazel, MD  nicotine  (NICODERM CQ - DOSED IN MG/24 HOURS) 21 mg/24hr patch Place 1 patch (  21 mg total) onto the skin daily. Patient not taking: Reported on 10/26/2019 08/13/19   Martyn Ehrich, NP  nicotine polacrilex (NICOTINE MINI) 2 MG lozenge Take 1 lozenge (2 mg total) by mouth as needed for smoking cessation. Patient not taking: Reported on 10/26/2019 08/13/19   Martyn Ehrich, NP  ondansetron (ZOFRAN) 4 MG tablet Take 1 tablet (4 mg total) by mouth every 6 (six) hours as needed for nausea or vomiting. Patient not taking: Reported on 10/26/2019 04/20/19   Ardeen Jourdain, PA-C     Erick Colace ACNP-BC [place] Pager # [phone] OR # [phone] if no answer

## 2019-10-29 NOTE — Progress Notes (Signed)
PROGRESS NOTE    James [name]  [id number] DOB: 1955/09/21 DOA: 10/26/2019 PCP: [name], MD   Brief Narrative:  HPI per Dr. Truddie Hidden on 10/26/2019 James [name] is a 64 y.o. male with medical history significant of asthma, CAD, T1DM, HLD, GERD, and Depression who presents with worsening shortness of breath.  Patient reports symptom onset Monday with worsening by Tuesday.  He reports he called and to pulmonology and he did not have any appointments but did prescribe him prednisone with taper as well as azithromycin until a potential follow-up appointment on Monday.  He states despite this he has had progressive symptoms, predominantly shortness of breath and dyspnea on exertion.  He has had a sore throat and productive cough which was more prominent earlier in the course and now is mostly dry with occasional clear sputum production.  He denies any fevers or chills, has chest pain only after serious coughing spells.  He states his coughing spells can be quite severe.  He states he is no longer smoking.  He is on Chantix.  He has been utilizing his inhalers more frequently, several times a [place].  No nausea, vomiting or diarrhea.  No loss of smell or taste.  No rashes.  He lives with his wife.  At baseline is independent in his ADLs.  **Interim History  Respiratory status was still tenuous and states that whenever he ambulates he becomes very short of breath.  States that this happens about 2 times a year.  Breathing treatments were adjusted and patient feels a little bit better but still has some progress to make he continues to cough and not improving very much..  He became acutely dyspneic and started coughing violently and could not catch of breath.  Respiratory virus panel still pending and repeat chest x-ray shows chronic bronchitis.  His medications were adjusted and he was placed on Levaquin yesterday and his steroids were increased as well as budesonide.  He was also given Tessalon  Perles for his cough suppression.  He is not improving and continues to have violent coughing episodes so pulmonary was consulted for further evaluation recommendations and they are agreeing with the CTA of the chest and have added PPI twice daily to control acid reflux.  Assessment & Plan:   Active Problems:   HLD (hyperlipidemia)   CAD (coronary artery disease)   Chronic asthmatic bronchitis (HCC)   Cough   TOBACCO USE, QUIT   Tobacco abuse   DM type 1 (diabetes mellitus, type 1) (HCC)   Asthma exacerbation   Acute asthma exacerbation   Acute Hypoxic Respiratory Failure in the setting of Acute Exacerbation of Asthma -Admitted as Inpatient  -Rapid COVID Negative along with Influenza A/B via PCR but will check a official respiratory virus panel -CXR showed "The heart size and mediastinal contours are within normal limits. Both lungs are clear. The visualized skeletal structures are unremarkable." -PFTs last year were not c/w COPD; precipitant likely pollen this time -Will finish azithromycin course, does appear to have bronchitis; since he did not improve will switch to levofloxacin as patient states that this works well for him; making minimal improvement -Received solumedrol 125 mg en route and given another dose in the ED and was continued on 40 mg daily of po Prednisone  (prior to admission patient had received prednisone 40 mg x 3 days) but will stop and started IV Solumedrol 60 mg q12h but because patient was still wheezing significantly have increase this to 60 mg  IV q. 6 scheduled per pulmonary recommends cutting this back down to every 12 -Stopped Breo Ellipta and start budesonide and Brovana nebs; increase budesonide dosing to 0.5 twice daily today -Stopped albuterol 3 times daily scheduled and will start Xopenex/Atrovent every 6 scheduled but this was changed to DuoNeb's 3 times daily by the respiratory therapist -Continue with albuterol as needed 2.5 mg neb every 6 as needed for  wheezing but will change to every 4 hours to the every 6 -Obtain peak flow meter monitoring as well as incentive spirometer, flutter valve next-we will start guaifenesin 1200 mg p.o. twice daily -Continuous pulse oximetry and maintain O2 saturation greater than 90% -Continue supplemental oxygen via nasal cannula and wean O2 as tolerated -We will need an ambulatory home O2 screen prior to discharge but this is when is getting closer to being discharged -Continue antitussives with hydrocodone-homatroprine 5 mL p.o. every 6 hours as needed for cough and will add 200 mg of Tessalon Perles given that he continues to violently cough -Continue with Loratadine 10 mg p.o. daily -Since he failed to improve significantly will obtain a chest CTA to rule out pulmonary embolus and consult pulmonary for further evaluation recommendations  -Pulmonary evaluated and recommends following up on CT of the chest and recommended coming down with prednisone as high doses are not helping and recommending continue Levaquin and adding a PPI IV twice daily to control acid reflux for now -They have also added reflux precautions -Rrepeat chest x-ray intermittently and repeat this a.m. showed "Lung volumes are normal. No consolidative airspace disease. No pleural effusions. No pneumothorax. No pulmonary nodule or mass noted. Pulmonary vasculature and the cardiomediastinal silhouette are within normal limits." -Patient does still sound extremely terrible on auscultation of his lungs today. -Continue to monitor respiratory status carefully  Well-Controlled Type 1 Diabetes Mellitus -Continue insulin pump in house with diabetes education coordinator to assist -Will schedule accuchecks -If he has issues with pump, patient can turn off and we'd need to transition to Basal Bolus regimen -Presently with moderate dose ISS -CBGs ranging from 76-215; sugar this morning on BMP was 175 -Diabetes coordinator consult placed and diabetes  education "I recommended that if blood sugars begin to rise with steroids may consider stopping insulin pump off of auto mode in terms of manual mode and will do a temporary basal of 125%  CAD -Continue Simvastatin, not on aspirin presently, hx in chart of CAD dating to 2008 -Now ordered for Aspirin 81 mg po Daily   HLD -Continue with Simvastatin 40 mg p.o. nightly  Tobacco Use Disorder -Smoking cessation counseling given and patient confirmed that he is no longer walking -Continue Varenicline 1 mg po BID along with Nicotine Polacrilex 2 mg po PRN Smoking Cessastion  Anxiety -No longer taking Hydroxyzine 1 tablet every 4 hours as needed for anxiety but will restart -Continue to Monitor   Overweight -Estimated body mass index is 25.84 kg/m as calculated from the following:   Height as of an earlier encounter on 10/26/19: 5\' 9"  (1.753 m).   Weight as of an earlier encounter on 10/26/19: 79.4 kg. -Weight Loss and Dietary Counseling given   Elevated BUN -Likely dissociated from creatinine in the setting of steroid demargination -Patient's BUN was 31 -Continue monitor and will continue monitor for signs and symptoms of upper GI bleeding  -Repeat CMP in a.m.  Reflux/GERD -? Contributing to Respiratory Symptoms -Pulmonology has started the patient on IV pantoprazole 40 mg twice daily  DVT prophylaxis: Enoxaparin 40  mg sq q24h Code Status: FULL CODE  Family Communication: No family present at bedside  Disposition Plan: Patient is from home and will need further clinical improvement of his respiratory status and be able to be weaned off of oxygen prior to safe discharge disposition which is likely back to home  Consultants:   Pulmonary   Procedures:   CTA of the chest  Antimicrobials:  Anti-infectives (From admission, onward)   Start     Dose/Rate Route Frequency Ordered Stop   10/28/19 1200  levofloxacin (LEVAQUIN) tablet 750 mg     750 mg Oral Daily 10/28/19 1105      10/26/19 1400  azithromycin (ZITHROMAX) tablet 250 mg    Note to Pharmacy: Zpack taper as directed     250 mg Oral Daily 10/26/19 1351 10/27/19 1021     Subjective: Seen and examined at bedside and he is still very dyspneic and states he is not really improved from yesterday.  Continues to cough significantly and states that he did not get to the point where he felt like he was going to pass out but he states that he is getting close to it.  No lightheadedness or dizziness.  No chest pain.  No other concerns at this time.  Objective: Vitals:   10/29/19 0548 10/29/19 0743 10/29/19 0746 10/29/19 1233  BP: 126/60     Pulse: 83 79  81  Resp: 18 20  20   Temp: 97.7 F (36.5 C)     TempSrc: Oral     SpO2: 96% 90% 93% 94%    Intake/Output Summary (Last 24 hours) at 10/29/2019 1500 Last data filed at 10/29/2019 [id number] Gross per 24 hour  Intake 720 ml  Output --  Net 720 ml   There were no vitals filed for this visit.  Examination: Physical Exam:  Constitutional: WN/WD Caucasian male currently in mild distress appears anxious and continues to cough episodically.,  Eyes: Lids and conjunctivae normal, sclerae anicteric  ENMT: External Ears, Nose appear normal. Grossly normal hearing.  Neck: Appears normal, supple, no cervical masses, normal ROM, no appreciable thyromegaly; no JVD Respiratory: Diminished to auscultation bilaterally with coarse breath sounds and some rhonchi along with some upper wheezing.  Wearing 2 L of supplemental oxygen via nasal cannula and is slightly tachypneic again and continues to have coughing episodes with some mildly labored breathing.  Cardiovascular: RRR, no murmurs / rubs / gallops. S1 and S2 auscultated. No extremity edema.  Abdomen: Soft, non-tender, mildly distended second body habitus. Bowel sounds positive.  GU: Deferred. Musculoskeletal: No clubbing / cyanosis of digits/nails. No joint deformity upper and lower extremities. Good ROM, no contractures.  Normal strength and muscle tone.  Skin: No rashes, lesions, ulcers on limited skin evaluation. No induration; Warm and dry.  Neurologic: CN 2-12 grossly intact with no focal deficits. Romberg sign and cerebellar reflexes not assessed.  Psychiatric: Normal judgment and insight. Alert and oriented x 3.  Mildly anxious mood and appropriate affect.   Data Reviewed: I have personally reviewed following labs and imaging studies  CBC: Recent Labs  Lab 10/26/19 1019 10/27/19 0546 10/28/19 0550 10/29/19 0521  WBC 5.8 6.4 8.4 9.4  NEUTROABS 4.1  --  7.6 8.6*  HGB 13.5 14.4 14.2 13.3  HCT 38.2* 40.6 40.1 38.4*  MCV 92.9 92.3 92.6 91.9  PLT 233 268 241 [id number]   Basic Metabolic Panel: Recent Labs  Lab 10/26/19 1019 10/27/19 0546 10/28/19 0550 10/29/19 0521  NA 138 138 136 134*  K 3.5 4.4 4.2 4.0  CL 107 104 105 101  CO2 23 24 23 23   GLUCOSE 223* 164* 165* 175*  BUN 16 22 28* 31*  CREATININE 0.70 0.75 0.78 0.83  CALCIUM 8.4* 8.9 8.6* 8.3*  MG  --   --  2.2 2.3  PHOS  --   --  3.4 3.5   GFR: Estimated Creatinine Clearance: 89.9 mL/min (by C-G formula based on SCr of 0.83 mg/dL). Liver Function Tests: Recent Labs  Lab 10/28/19 0550 10/29/19 0521  AST 13* 12*  ALT 15 13  ALKPHOS 82 76  BILITOT 1.0 0.4  PROT 6.1* 5.9*  ALBUMIN 3.6 3.6   No results for input(s): LIPASE, AMYLASE in the last 168 hours. No results for input(s): AMMONIA in the last 168 hours. Coagulation Profile: No results for input(s): INR, PROTIME in the last 168 hours. Cardiac Enzymes: No results for input(s): CKTOTAL, CKMB, CKMBINDEX, TROPONINI in the last 168 hours. BNP (last 3 results) No results for input(s): PROBNP in the last 8760 hours. HbA1C: No results for input(s): HGBA1C in the last 72 hours. CBG: Recent Labs  Lab 10/28/19 2026 10/29/19 0010 10/29/19 0421 10/29/19 0736 10/29/19 1158  GLUCAP 166* 215* 169* 145* 215*   Lipid Profile: No results for input(s): CHOL, HDL, LDLCALC, TRIG,  CHOLHDL, LDLDIRECT in the last 72 hours. Thyroid Function Tests: No results for input(s): TSH, T4TOTAL, FREET4, T3FREE, THYROIDAB in the last 72 hours. Anemia Panel: No results for input(s): VITAMINB12, FOLATE, FERRITIN, TIBC, IRON, RETICCTPCT in the last 72 hours. Sepsis Labs: No results for input(s): PROCALCITON, LATICACIDVEN in the last 168 hours.  Recent Results (from the past 240 hour(s))  Respiratory Panel by RT PCR (Flu A&B, Covid) - Nasopharyngeal Swab     Status: None   Collection Time: 10/26/19 10:14 AM   Specimen: Nasopharyngeal Swab  Result Value Ref Range Status   SARS Coronavirus 2 by RT PCR NEGATIVE NEGATIVE Final    Comment: (NOTE) SARS-CoV-2 target nucleic acids are NOT DETECTED. The SARS-CoV-2 RNA is generally detectable in upper respiratoy specimens during the acute phase of infection. The lowest concentration of SARS-CoV-2 viral copies this assay can detect is 131 copies/mL. A negative result does not preclude SARS-Cov-2 infection and should not be used as the sole basis for treatment or other patient management decisions. A negative result may occur with  improper specimen collection/handling, submission of specimen other than nasopharyngeal swab, presence of viral mutation(s) within the areas targeted by this assay, and inadequate number of viral copies (<131 copies/mL). A negative result must be combined with clinical observations, patient history, and epidemiological information. The expected result is Negative. Fact Sheet for Patients:  PinkCheek.be Fact Sheet for Healthcare Providers:  GravelBags.it This test is not yet ap proved or cleared by the [place] FDA and  has been authorized for detection and/or diagnosis of SARS-CoV-2 by FDA under an Emergency Use Authorization (EUA). This EUA will remain  in effect (meaning this test can be used) for the duration of the COVID-19 declaration under  Section 564(b)(1) of the Act, 21 U.S.C. section 360bbb-3(b)(1), unless the authorization is terminated or revoked sooner.    Influenza A by PCR NEGATIVE NEGATIVE Final   Influenza B by PCR NEGATIVE NEGATIVE Final    Comment: (NOTE) The Xpert Xpress SARS-CoV-2/FLU/RSV assay is intended as an aid in  the diagnosis of influenza from Nasopharyngeal swab specimens and  should not be used as a sole basis for treatment. Nasal washings and  aspirates  are unacceptable for Xpert Xpress SARS-CoV-2/FLU/RSV  testing. Fact Sheet for Patients: PinkCheek.be Fact Sheet for Healthcare Providers: GravelBags.it This test is not yet approved or cleared by the [place] FDA and  has been authorized for detection and/or diagnosis of SARS-CoV-2 by  FDA under an Emergency Use Authorization (EUA). This EUA will remain  in effect (meaning this test can be used) for the duration of the  Covid-19 declaration under Section 564(b)(1) of the Act, 21  U.S.C. section 360bbb-3(b)(1), unless the authorization is  terminated or revoked. Performed at [facility], [place] [address], [place],  [address]     RN Pressure Injury Documentation:     Estimated body mass index is 25.84 kg/m as calculated from the following:   Height as of an earlier encounter on 10/26/19: 5\' 9"  (1.753 m).   Weight as of an earlier encounter on 10/26/19: 79.4 kg.  Malnutrition Type:      Malnutrition Characteristics:      Nutrition Interventions:    Radiology Studies: DG CHEST PORT 1 VIEW  Result Date: 10/29/2019 CLINICAL DATA:  64 year old male with history of shortness of breath. EXAM: PORTABLE CHEST 1 VIEW COMPARISON:  Chest x-ray 10/28/2019. FINDINGS: Lung volumes are normal. No consolidative airspace disease. No pleural effusions. No pneumothorax. No pulmonary nodule or mass noted. Pulmonary vasculature and the cardiomediastinal silhouette are  within normal limits. IMPRESSION: No radiographic evidence of acute cardiopulmonary disease. Electronically Signed   By: Vinnie Langton M.D.   On: 10/29/2019 07:55   DG CHEST PORT 1 VIEW  Result Date: 10/28/2019 CLINICAL DATA:  Short of breath EXAM: PORTABLE CHEST 1 VIEW COMPARISON:  None. FINDINGS: Normal mediastinum and cardiac silhouette. Chronic central bronchitic markings. Normal pulmonary vasculature. No effusion, infiltrate, or pneumothorax. Anterior cervical fusion IMPRESSION: Chronic bronchitic markings.  No acute findings. Electronically Signed   By: Suzy Bouchard M.D.   On: 10/28/2019 06:51   Scheduled Meds: . arformoterol  15 mcg Nebulization BID  . aspirin  81 mg Oral Daily  . budesonide (PULMICORT) nebulizer solution  0.5 mg Nebulization BID  . enoxaparin (LOVENOX) injection  40 mg Subcutaneous Q24H  . finasteride  5 mg Oral Daily  . guaiFENesin  1,200 mg Oral BID  . ipratropium-albuterol  3 mL Nebulization TID  . levofloxacin  750 mg Oral Daily  . loratadine  10 mg Oral Daily  . methylPREDNISolone (SOLU-MEDROL) injection  60 mg Intravenous Q6H  . pantoprazole (PROTONIX) IV  40 mg Intravenous Q12H  . simvastatin  40 mg Oral QHS  . sodium chloride (PF)      . traZODone  150 mg Oral QHS  . varenicline  1 mg Oral BID   Continuous Infusions:   LOS: 3 days   Kerney Elbe, DO [facility] PAGER is on [place]  If 7PM-7AM, please contact night-coverage [email or web address]

## 2019-10-30 ENCOUNTER — Inpatient Hospital Stay (HOSPITAL_COMMUNITY): Payer: 59

## 2019-10-30 DIAGNOSIS — R05 Cough: Secondary | ICD-10-CM | POA: Diagnosis not present

## 2019-10-30 DIAGNOSIS — I2510 Atherosclerotic heart disease of native coronary artery without angina pectoris: Secondary | ICD-10-CM | POA: Diagnosis not present

## 2019-10-30 DIAGNOSIS — J449 Chronic obstructive pulmonary disease, unspecified: Secondary | ICD-10-CM | POA: Diagnosis not present

## 2019-10-30 DIAGNOSIS — Z720 Tobacco use: Secondary | ICD-10-CM

## 2019-10-30 DIAGNOSIS — R799 Abnormal finding of blood chemistry, unspecified: Secondary | ICD-10-CM

## 2019-10-30 LAB — CBC WITH DIFFERENTIAL/PLATELET
Abs Immature Granulocytes: 0.06 10*3/uL (ref 0.00–0.07)
Basophils Absolute: 0 10*3/uL (ref 0.0–0.1)
Basophils Relative: 0 %
Eosinophils Absolute: 0 10*3/uL (ref 0.0–0.5)
Eosinophils Relative: 0 %
HCT: 40.3 % (ref 39.0–52.0)
Hemoglobin: 14.1 g/dL (ref 13.0–17.0)
Immature Granulocytes: 1 %
Lymphocytes Relative: 7 %
Lymphs Abs: 0.7 10*3/uL (ref 0.7–4.0)
MCH: 32.2 pg (ref 26.0–34.0)
MCHC: 35 g/dL (ref 30.0–36.0)
MCV: 92 fL (ref 80.0–100.0)
Monocytes Absolute: 0.2 10*3/uL (ref 0.1–1.0)
Monocytes Relative: 2 %
Neutro Abs: 8.5 10*3/uL — ABNORMAL HIGH (ref 1.7–7.7)
Neutrophils Relative %: 90 %
Platelets: 257 10*3/uL (ref 150–400)
RBC: 4.38 MIL/uL (ref 4.22–5.81)
RDW: 11.5 % (ref 11.5–15.5)
WBC: 9.4 10*3/uL (ref 4.0–10.5)
nRBC: 0 % (ref 0.0–0.2)

## 2019-10-30 LAB — COMPREHENSIVE METABOLIC PANEL
ALT: 13 U/L (ref 0–44)
AST: 13 U/L — ABNORMAL LOW (ref 15–41)
Albumin: 3.6 g/dL (ref 3.5–5.0)
Alkaline Phosphatase: 75 U/L (ref 38–126)
Anion gap: 11 (ref 5–15)
BUN: 31 mg/dL — ABNORMAL HIGH (ref 8–23)
CO2: 22 mmol/L (ref 22–32)
Calcium: 8.4 mg/dL — ABNORMAL LOW (ref 8.9–10.3)
Chloride: 102 mmol/L (ref 98–111)
Creatinine, Ser: 0.82 mg/dL (ref 0.61–1.24)
GFR calc Af Amer: >60 mL/min (ref >60)
GFR calc non Af Amer: >60 mL/min (ref >60)
Glucose, Bld: 229 mg/dL — ABNORMAL HIGH (ref 70–99)
Potassium: 4.4 mmol/L (ref 3.5–5.1)
Sodium: 135 mmol/L (ref 135–145)
Total Bilirubin: 0.7 mg/dL (ref 0.3–1.2)
Total Protein: 5.8 g/dL — ABNORMAL LOW (ref 6.5–8.1)

## 2019-10-30 LAB — GLUCOSE, CAPILLARY
Glucose-Capillary: 124 mg/dL — ABNORMAL HIGH (ref 70–99)
Glucose-Capillary: 188 mg/dL — ABNORMAL HIGH (ref 70–99)
Glucose-Capillary: 248 mg/dL — ABNORMAL HIGH (ref 70–99)
Glucose-Capillary: 196 mg/dL — ABNORMAL HIGH (ref 70–99)
Glucose-Capillary: 107 mg/dL — ABNORMAL HIGH (ref 70–99)
Glucose-Capillary: 74 mg/dL (ref 70–99)

## 2019-10-30 LAB — PHOSPHORUS: Phosphorus: 4.1 mg/dL (ref 2.5–4.6)

## 2019-10-30 LAB — MAGNESIUM: Magnesium: 2.3 mg/dL (ref 1.7–2.4)

## 2019-10-30 MED ORDER — FAMOTIDINE IN NACL 20-0.9 MG/50ML-% IV SOLN
20.0000 mg | Freq: Every day | INTRAVENOUS | Status: DC
  Administered 2019-10-30: 20 mg via INTRAVENOUS
  Filled 2019-10-30: qty 50

## 2019-10-30 MED ORDER — METHYLPREDNISOLONE SODIUM SUCC 40 MG IJ SOLR
40.0000 mg | Freq: Two times a day (BID) | INTRAMUSCULAR | Status: DC
  Administered 2019-10-30 – 2019-10-31 (×2): 40 mg via INTRAVENOUS
  Filled 2019-10-30 (×2): qty 1

## 2019-10-30 MED ORDER — HYDROCODONE-HOMATROPINE 5-1.5 MG/5ML PO SYRP
5.0000 mL | ORAL_SOLUTION | ORAL | Status: DC | PRN
  Administered 2019-10-30 – 2019-11-01 (×9): 5 mL via ORAL
  Filled 2019-10-30 (×9): qty 5

## 2019-10-30 MED ORDER — METHYLPREDNISOLONE SODIUM SUCC 125 MG IJ SOLR: 60.0000 mg | Freq: Two times a day (BID) | INTRAMUSCULAR | Status: DC

## 2019-10-30 MED ORDER — AZELASTINE HCL 0.1 % NA SOLN
2.0000 | Freq: Two times a day (BID) | NASAL | Status: DC
  Administered 2019-10-30 – 2019-11-01 (×4): 2 via NASAL
  Filled 2019-10-30: qty 30

## 2019-10-30 NOTE — Progress Notes (Signed)
   NAME:  James [name], MRN:  [id number], DOB:  June 30, 1956, LOS: 4 ADMISSION DATE:  10/26/2019, CONSULTATION DATE:  4/12 REFERRING MD:  Alfredia Ferguson, CHIEF COMPLAINT:  Dyspnea and asthmatic exacerbation    Brief History   64 year old male with history of asthmatic bronchitis admitted on 4/9 with asthmatic exacerbation Pulmonary asked to evaluate on 4/12 as continued to demonstrate significant symptom burden in spite of systemic steroids, inhaled bronchodilators and inhaled corticosteroids as well as empiric antibiotics  Past Medical History  Asthma, gastroesophageal reflux disease, hyperlipidemia, coronary artery disease, type 1 diabetes. Former smoker Had a history of not taking medications due to cost restraints Last documented exacerbations April 2020, February 2020, December 2020.  Significant Hospital Events   4/9 admitted. Started on steroids and abx cont. Cont BDs 4/10 steroids increased 4/11 added levaquin  4/12 pulm consulted. Added PPI BID  Consults:  pulm consulted.   Procedures:    Significant Diagnostic Tests:  CT chest 4/12>>>Stable changes of central bronchial thickening likely related to bronchitis.Scattered ground-glass opacities some of which are new from the prior CT examination. Some interval resolution of previously seen opacities is noted consistent with the waxing and waning inflammatory process.No evidence of pulmonary emboli. Changes of prior granulomatous disease.  Micro Data:    Antimicrobials:  azith 4/5>>4/11 levaquin 4/11>>>  Interim history/subjective:  Feels a little bit better.  Still having nocturnal episodes of coughing  Objective   Blood pressure (Abnormal) 145/76, pulse 82, temperature 98.6 F (37 C), temperature source Oral, resp. rate 16, SpO2 91 %.       No intake or output data in the 24 hours ending 10/30/19 1154 There were no vitals filed for this visit.  Examination: General Pleasant 64 year old white male sitting up in bed no acute  distress this morning HEENT normocephalic atraumatic continues to have very turbulent, loud, raspy upper airway noises and wheezing Pulmonary: Expiratory wheeze throughout no accessory use, much of this is transmitted from the upper airway Cardiac regular rate and rhythm Abdomen soft nontender Extremities are warm and dry Neuro is intact  Resolved Hospital Problem list     Assessment & Plan:   Asthmatic exacerbation LPR GERD Seasonal allergies Cough     Asthmatic exacerbation, has history of allergic rhinitis, suspect likely exacerbated by seasonal allergies-->w/ symptom resolution being delayed by reflux and associated cough causing worsening upper airway irritation    Discussion -CT was neg for PE suspect element of upper airway LPR contributing. W/ his cough and active reflux suspect that these are sig contributing factors to the upper airway irritation.  Of note most of his symptoms at this point come in sudden coughing spasms.  More frequently at night now, almost nonexistent during the [place].  Interestingly in the past following cervical surgery he had difficulty with swallowing and actually some dysphagia.  He does have a history of diabetes as well which raises his risk of esophageal dysmotility, nocturnal coughing and reflux would be consistent with this  Plan/recommendation Cont solumedrol ->change from 60 to 40mg  bid Cont scheduled brovana and budesonide as well as duoneb Basil 3 levaquin (would stop at 5 days-->not sure what we are treating) Cont PPI BID, adding Pepcid at at bedtime Add nasal antihistamine Continue reflux precautions Obtaining esophagram looking for reflux Escalated cough suppression at at bedtime    Erick Colace ACNP-BC [place] Pager # [phone] OR # [phone] if no answer

## 2019-10-30 NOTE — Progress Notes (Signed)
Inpatient Diabetes Program Recommendations  AACE/ADA: New Consensus Statement on Inpatient Glycemic Control (2015)  Target Ranges:  Prepandial:   less than 140 mg/dL      Peak postprandial:   less than 180 mg/dL (1-2 hours)      Critically ill patients:  140 - 180 mg/dL   Lab Results  Component Value Date   GLUCAP 248 (H) 10/30/2019   HGBA1C 6.5 (H) 10/26/2019    Review of Glycemic Control  On Solumedrol 60 mg Q6H FBS this am: 229, 248.    Inpatient Diabetes Program Recommendations:     If blood sugars stay in mid-200s, may need to do temporary basal of 125%. (Endo's recs when pt was on steroids in the past.)  Will speak with pt regarding above today.  Continue to follow.  Thank you. Lorenda Peck, RD, LDN, CDE Inpatient Diabetes Coordinator [phone]

## 2019-10-30 NOTE — Progress Notes (Signed)
PROGRESS NOTE    James [name]  [id number] DOB: 1955/11/29 DOA: 10/26/2019 PCP: [name], MD   Brief Narrative:  HPI per Dr. Truddie Hidden on 10/26/2019 James [name] is a 64 y.o. male with medical history significant of asthma, CAD, T1DM, HLD, GERD, and Depression who presents with worsening shortness of breath.  Patient reports symptom onset Monday with worsening by Tuesday.  He reports he called and to pulmonology and he did not have any appointments but did prescribe him prednisone with taper as well as azithromycin until a potential follow-up appointment on Monday.  He states despite this he has had progressive symptoms, predominantly shortness of breath and dyspnea on exertion.  He has had a sore throat and productive cough which was more prominent earlier in the course and now is mostly dry with occasional clear sputum production.  He denies any fevers or chills, has chest pain only after serious coughing spells.  He states his coughing spells can be quite severe.  He states he is no longer smoking.  He is on Chantix.  He has been utilizing his inhalers more frequently, several times a [place].  No nausea, vomiting or diarrhea.  No loss of smell or taste.  No rashes.  He lives with his wife.  At baseline is independent in his ADLs.  **Interim History  Respiratory status was still tenuous and states that whenever he ambulates he becomes very short of breath.  States that this happens about 2 times a year.  Breathing treatments were adjusted and patient feels a little bit better but still has some progress to make he continues to cough and not improving very much On 10/28/19  He became acutely dyspneic and started coughing violently and could not catch of breath.  Respiratory virus panel still pending and repeat chest x-ray shows chronic bronchitis.  His medications were adjusted and he was placed on Levaquin yesterday and his steroids were increased as well as budesonide.  He was also given  Tessalon Perles for his cough suppression.  Since he was not improving and continues to have violent coughing episodes so Pulmonary was consulted for further evaluation recommendations and they are agreeing with the CTA of the chest and have added PPI twice daily to control acid reflux.  CTA of the Chest showed "Stable changes of central bronchial thickening likely related to Bronchitis.  Scattered ground-glass opacities some of which are new from the prior CT examination. Some interval resolution of previously seen opacities is noted consistent with the waxing and waning inflammatory process. No evidence of pulmonary emboli. Changes of prior granulomatous disease." Patient still does not feel much better. Steroids were we weaned back down to q12h and they are weaning the dose further.  Pulmonary is also ordering esophagram to look for reflux and they have also escalate his cough suppression at bedtime given a continued cough.  Assessment & Plan:   Active Problems:   HLD (hyperlipidemia)   CAD (coronary artery disease)   Chronic asthmatic bronchitis (HCC)   Cough   TOBACCO USE, QUIT   Tobacco abuse   DM type 1 (diabetes mellitus, type 1) (HCC)   Asthma exacerbation   Acute asthma exacerbation   Acute Hypoxic Respiratory Failure in the setting of Acute Exacerbation of Asthma -Admitted as Inpatient  -Rapid COVID Negative along with Influenza A/B via PCR but will check a official respiratory virus panel -CXR showed "The heart size and mediastinal contours are within normal limits. Both lungs are clear. The  visualized skeletal structures are unremarkable." -PFTs last year were not c/w COPD; precipitant likely pollen this time -Will finish azithromycin course, does appear to have bronchitis; since he did not improve will switch to levofloxacin as patient states that this works well for him; making minimal improvement -Received solumedrol 125 mg en route and given another dose in the ED and was  continued on 40 mg daily of po Prednisone  (prior to admission patient had received prednisone 40 mg x 3 days) but will stop and started IV Solumedrol 60 mg q12h but because patient was still wheezing significantly have increase this to 60 mg IV q. 6 scheduled per pulmonary recommends cutting this back down to every 12 as was done today; pulmonary has further decrease this to 40 mg every 12 -Stopped Breo Ellipta and start budesonide and Brovana nebs; increase budesonide dosing to 0.5 twice daily today -Stopped albuterol 3 times daily scheduled and will start Xopenex/Atrovent every 6 scheduled but this was changed to DuoNeb's 3 times daily by the respiratory therapist -Continue with albuterol as needed 2.5 mg neb every 6 as needed for wheezing but will change to every 4 hours to the every 6 -Obtain peak flow meter monitoring as well as incentive spirometer, flutter valve next-we will start guaifenesin 1200 mg p.o. twice daily -Continuous pulse oximetry and maintain O2 saturation greater than 90% -Continue supplemental oxygen via nasal cannula and wean O2 as tolerated -We will need an ambulatory home O2 screen prior to discharge but this is when is getting closer to being discharged and is doing better respiratory wise -Continue antitussives with hydrocodone-homatroprine 5 mL p.o. every 6 hours as needed for cough and will add 200 mg of Tessalon Perles given that he continues to violently cough; pulmonary has increased to hydrocodone-homatropine to 5 mL every 4 hours as needed for cough -Continue with Loratadine 10 mg p.o. daily -Since he failed to improve significantly will obtain a chest CTA to rule out pulmonary embolus and consult pulmonary for further evaluation recommendations  -CTA showed "Stable changes of central bronchial thickening likely related to Bronchitis.  Scattered ground-glass opacities some of which are new from the prior CT examination. Some interval resolution of previously seen  opacities is noted consistent with the waxing and waning inflammatory process. No evidence of pulmonary emboli. Changes of prior granulomatous disease." -Pulmonary evaluated and recommends following up on CT of the chest and recommended coming down with prednisone as high doses are not helping and recommending continue Levaquin and adding a PPI IV twice daily to control acid reflux for now -They have also added reflux precautions -Rrepeat chest x-ray intermittently and repeat this a.m. showed "Mild streaky density at the lung bases attributed to atelectasis.There is prominent bronchitis by chest CT yesterday no edema, effusion, or pneumothorax. Normal heart size." -Pulmonary is now recommending obtaining an esophagram to evaluate for reflux -Patient does still sound extremely terrible on auscultation of his lungs this is improving. -Continue to monitor respiratory status carefully  Well-Controlled Type 1 Diabetes Mellitus -Continue insulin pump in house with diabetes education coordinator to assist -Will schedule accuchecks -If he has issues with pump, patient can turn off and we'd need to transition to Basal Bolus regimen -Presently with moderate dose ISS -CBGs ranging from 140-196; sugar this morning on BMP was 229 -Diabetes coordinator consult placed and diabetes education coordinator recommended that if blood sugars begin to rise with steroids may consider stopping insulin pump off of auto mode in terms of manual mode and  will do a temporary basal of 125%  CAD -Continue Simvastatin, not on aspirin presently, hx in chart of CAD dating to 2008 -Now ordered for Aspirin 81 mg po Daily   HLD -Continue with Simvastatin 40 mg p.o. nightly  Tobacco Use Disorder -Smoking cessation counseling given and patient confirmed that he is no longer walking -Continue Varenicline 1 mg po BID along with Nicotine Polacrilex 2 mg po PRN Smoking Cessastion  Anxiety -No longer taking Hydroxyzine 1 tablet  every 4 hours as needed for anxiety but will restart -Continue to Monitor   Overweight -Estimated body mass index is 25.84 kg/m as calculated from the following:   Height as of an earlier encounter on 10/26/19: 5\' 9"  (1.753 m).   Weight as of an earlier encounter on 10/26/19: 79.4 kg. -Weight Loss and Dietary Counseling given   Elevated BUN -Likely dissociated from creatinine in the setting of steroid demargination -Patient's BUN was 31 -Continue monitor and will continue monitor for signs and symptoms of upper GI bleeding  -Repeat CMP in a.m.  Reflux/GERD -? Contributing to Respiratory Symptoms -Pulmonology has started the patient on IV pantoprazole 40 mg twice daily -Patient did undergo an esophagram  DVT prophylaxis: Enoxaparin 40 mg sq q24h Code Status: FULL CODE  Family Communication: No family present at bedside  Disposition Plan: Patient is from home and will need further clinical improvement of his respiratory status and be able to be weaned off of oxygen prior to safe discharge disposition which is likely back to home  Consultants:   Pulmonary   Procedures:   CTA of the chest  Antimicrobials:  Anti-infectives (From admission, onward)   Start     Dose/Rate Route Frequency Ordered Stop   10/28/19 1200  levofloxacin (LEVAQUIN) tablet 750 mg     750 mg Oral Daily 10/28/19 1105     10/26/19 1400  azithromycin (ZITHROMAX) tablet 250 mg    Note to Pharmacy: Zpack taper as directed     250 mg Oral Daily 10/26/19 1351 10/27/19 1021     Subjective: Seen and examined at bedside and states that he is not feeling much better and felt about the same as yesterday.  States that he had another rough night.  No nausea or vomiting.  States he started coughing violently last night again.  Continues to feel little dyspneic.  No other concerns or complaints at this time.  Objective: Vitals:   10/30/19 0653 10/30/19 0753 10/30/19 1328 10/30/19 1424  BP: (!) 145/76  138/69   Pulse: 82   89   Resp: 16  18   Temp: 98.6 F (37 C)  97.8 F (36.6 C)   TempSrc: Oral  Oral   SpO2: 90% 91% 91% 90%   No intake or output data in the 24 hours ending 10/30/19 1449 There were no vitals filed for this visit.  Examination: Physical Exam:  Constitutional: WN/WD male currently in NAD today but does appear little anxious Eyes: Lids and conjunctivae normal, sclerae anicteric  ENMT: External Ears, Nose appear normal. Grossly normal hearing.  Neck: Appears normal, supple, no cervical masses, normal ROM, no appreciable thyromegaly: No JVD Respiratory: Diminished to auscultation bilaterally with coarse breath sounds and some rhonchi along with some wheezing; no appreciable rales or crackles.  Wearing 2 L of supplemental oxygen via nasal cannula Cardiovascular: RRR, no murmurs / rubs / gallops. S1 and S2 auscultated.  Abdomen: Soft, non-tender, distended second body habitus. Bowel sounds positive.  GU: Deferred. Musculoskeletal: No clubbing /  cyanosis of digits/nails. No joint deformity upper and lower extremities.  Skin: No rashes, lesions, ulcers on limited skin evaluation. No induration; Warm and dry.  Neurologic: CN 2-12 grossly intact with no focal deficits. Romberg sign and cerebellar reflexes not assessed.  Psychiatric: Normal judgment and insight. Alert and oriented x 3.  Mildly anxious mood and appropriate affect.   Data Reviewed: I have personally reviewed following labs and imaging studies  CBC: Recent Labs  Lab 10/26/19 1019 10/27/19 0546 10/28/19 0550 10/29/19 0521 10/30/19 0543  WBC 5.8 6.4 8.4 9.4 9.4  NEUTROABS 4.1  --  7.6 8.6* 8.5*  HGB 13.5 14.4 14.2 13.3 14.1  HCT 38.2* 40.6 40.1 38.4* 40.3  MCV 92.9 92.3 92.6 91.9 92.0  PLT 233 268 241 270 [id number]   Basic Metabolic Panel: Recent Labs  Lab 10/26/19 1019 10/27/19 0546 10/28/19 0550 10/29/19 0521 10/30/19 0543  NA 138 138 136 134* 135  K 3.5 4.4 4.2 4.0 4.4  CL 107 104 105 101 102  CO2 23 24 23 23 22     GLUCOSE 223* 164* 165* 175* 229*  BUN 16 22 28* 31* 31*  CREATININE 0.70 0.75 0.78 0.83 0.82  CALCIUM 8.4* 8.9 8.6* 8.3* 8.4*  MG  --   --  2.2 2.3 2.3  PHOS  --   --  3.4 3.5 4.1   GFR: Estimated Creatinine Clearance: 91 mL/min (by C-G formula based on SCr of 0.82 mg/dL). Liver Function Tests: Recent Labs  Lab 10/28/19 0550 10/29/19 0521 10/30/19 0543  AST 13* 12* 13*  ALT 15 13 13   ALKPHOS 82 76 75  BILITOT 1.0 0.4 0.7  PROT 6.1* 5.9* 5.8*  ALBUMIN 3.6 3.6 3.6   No results for input(s): LIPASE, AMYLASE in the last 168 hours. No results for input(s): AMMONIA in the last 168 hours. Coagulation Profile: No results for input(s): INR, PROTIME in the last 168 hours. Cardiac Enzymes: No results for input(s): CKTOTAL, CKMB, CKMBINDEX, TROPONINI in the last 168 hours. BNP (last 3 results) No results for input(s): PROBNP in the last 8760 hours. HbA1C: No results for input(s): HGBA1C in the last 72 hours. CBG: Recent Labs  Lab 10/29/19 2117 10/30/19 0114 10/30/19 0404 10/30/19 0813 10/30/19 1221  GLUCAP 140* 124* 188* 248* 196*   Lipid Profile: No results for input(s): CHOL, HDL, LDLCALC, TRIG, CHOLHDL, LDLDIRECT in the last 72 hours. Thyroid Function Tests: No results for input(s): TSH, T4TOTAL, FREET4, T3FREE, THYROIDAB in the last 72 hours. Anemia Panel: No results for input(s): VITAMINB12, FOLATE, FERRITIN, TIBC, IRON, RETICCTPCT in the last 72 hours. Sepsis Labs: No results for input(s): PROCALCITON, LATICACIDVEN in the last 168 hours.  Recent Results (from the past 240 hour(s))  Respiratory Panel by RT PCR (Flu A&B, Covid) - Nasopharyngeal Swab     Status: None   Collection Time: 10/26/19 10:14 AM   Specimen: Nasopharyngeal Swab  Result Value Ref Range Status   SARS Coronavirus 2 by RT PCR NEGATIVE NEGATIVE Final    Comment: (NOTE) SARS-CoV-2 target nucleic acids are NOT DETECTED. The SARS-CoV-2 RNA is generally detectable in upper respiratoy specimens  during the acute phase of infection. The lowest concentration of SARS-CoV-2 viral copies this assay can detect is 131 copies/mL. A negative result does not preclude SARS-Cov-2 infection and should not be used as the sole basis for treatment or other patient management decisions. A negative result may occur with  improper specimen collection/handling, submission of specimen other than nasopharyngeal swab, presence of viral mutation(s) within  the areas targeted by this assay, and inadequate number of viral copies (<131 copies/mL). A negative result must be combined with clinical observations, patient history, and epidemiological information. The expected result is Negative. Fact Sheet for Patients:  PinkCheek.be Fact Sheet for Healthcare Providers:  GravelBags.it This test is not yet ap proved or cleared by the [place] FDA and  has been authorized for detection and/or diagnosis of SARS-CoV-2 by FDA under an Emergency Use Authorization (EUA). This EUA will remain  in effect (meaning this test can be used) for the duration of the COVID-19 declaration under Section 564(b)(1) of the Act, 21 U.S.C. section 360bbb-3(b)(1), unless the authorization is terminated or revoked sooner.    Influenza A by PCR NEGATIVE NEGATIVE Final   Influenza B by PCR NEGATIVE NEGATIVE Final    Comment: (NOTE) The Xpert Xpress SARS-CoV-2/FLU/RSV assay is intended as an aid in  the diagnosis of influenza from Nasopharyngeal swab specimens and  should not be used as a sole basis for treatment. Nasal washings and  aspirates are unacceptable for Xpert Xpress SARS-CoV-2/FLU/RSV  testing. Fact Sheet for Patients: PinkCheek.be Fact Sheet for Healthcare Providers: GravelBags.it This test is not yet approved or cleared by the [place] FDA and  has been authorized for detection and/or diagnosis of  SARS-CoV-2 by  FDA under an Emergency Use Authorization (EUA). This EUA will remain  in effect (meaning this test can be used) for the duration of the  Covid-19 declaration under Section 564(b)(1) of the Act, 21  U.S.C. section 360bbb-3(b)(1), unless the authorization is  terminated or revoked. Performed at [facility], [place] [address] [place],  [address]     RN Pressure Injury Documentation:     Estimated body mass index is 25.84 kg/m as calculated from the following:   Height as of an earlier encounter on 10/26/19: 5\' 9"  (1.753 m).   Weight as of an earlier encounter on 10/26/19: 79.4 kg.  Malnutrition Type:      Malnutrition Characteristics:      Nutrition Interventions:    Radiology Studies: CT ANGIO CHEST PE W OR WO CONTRAST  Result Date: 10/29/2019 CLINICAL DATA:  Chronic dyspnea EXAM: CT ANGIOGRAPHY CHEST WITH CONTRAST TECHNIQUE: Multidetector CT imaging of the chest was performed using the standard protocol during bolus administration of intravenous contrast. Multiplanar CT image reconstructions and MIPs were obtained to evaluate the vascular anatomy. CONTRAST:  173mL OMNIPAQUE IOHEXOL 350 MG/ML SOLN COMPARISON:  Chest x-ray from earlier in the same [place], CT from 07/03/2019. FINDINGS: Cardiovascular: Thoracic aorta and its branches demonstrate mild atherosclerotic calcifications. No aneurysmal dilatation or dissection is seen. No cardiac enlargement is seen. Coronary calcifications are noted. The pulmonary artery is slightly limited in evaluation due to the timing of the contrast bolus although no definitive central pulmonary embolus is seen. Mediastinum/Nodes: Thoracic inlet is within normal limits. No hilar or mediastinal adenopathy is noted. The esophagus is within normal limits. Lungs/Pleura: The lungs are well aerated bilaterally. Scattered scarring is noted in the bases bilaterally. There are patchy ground-glass opacities likely postinflammatory  in nature within the left upper lobe. The previously seen ground-glass opacity has resolved in new changes are seen. Calcified granuloma is noted in the left lower lobe. Some mild bronchial wall thickening is noted with some areas of mucous plugging. This is stable from the prior exam. Upper Abdomen: Changes of prior granulomatous disease are noted in the spleen. Small nonobstructing left renal calculi are noted. Musculoskeletal: Degenerative changes of the thoracic spine  are seen. No acute rib abnormality is noted. Old rib fractures are seen on the left. Review of the MIP images confirms the above findings. IMPRESSION: Stable changes of central bronchial thickening likely related to bronchitis. Scattered ground-glass opacities some of which are new from the prior CT examination. Some interval resolution of previously seen opacities is noted consistent with the waxing and waning inflammatory process. No evidence of pulmonary emboli. Changes of prior granulomatous disease. Aortic Atherosclerosis (ICD10-I70.0). Electronically Signed   By: Inez Catalina M.D.   On: 10/29/2019 15:08   DG CHEST PORT 1 VIEW  Result Date: 10/30/2019 CLINICAL DATA:  Shortness of breath EXAM: PORTABLE CHEST 1 VIEW COMPARISON:  Yesterday FINDINGS: Mild streaky density at the lung bases attributed to atelectasis. There is prominent bronchitis by chest CT yesterday no edema, effusion, or pneumothorax. Normal heart size. IMPRESSION: Mild increase in lower lobe atelectasis. Electronically Signed   By: Monte Fantasia M.D.   On: 10/30/2019 07:41   DG CHEST PORT 1 VIEW  Result Date: 10/29/2019 CLINICAL DATA:  64 year old male with history of shortness of breath. EXAM: PORTABLE CHEST 1 VIEW COMPARISON:  Chest x-ray 10/28/2019. FINDINGS: Lung volumes are normal. No consolidative airspace disease. No pleural effusions. No pneumothorax. No pulmonary nodule or mass noted. Pulmonary vasculature and the cardiomediastinal silhouette are within normal  limits. IMPRESSION: No radiographic evidence of acute cardiopulmonary disease. Electronically Signed   By: Vinnie Langton M.D.   On: 10/29/2019 07:55   Scheduled Meds: . arformoterol  15 mcg Nebulization BID  . aspirin  81 mg Oral Daily  . azelastine  2 spray Each Nare BID  . budesonide (PULMICORT) nebulizer solution  0.5 mg Nebulization BID  . enoxaparin (LOVENOX) injection  40 mg Subcutaneous Q24H  . finasteride  5 mg Oral Daily  . guaiFENesin  1,200 mg Oral BID  . ipratropium-albuterol  3 mL Nebulization TID  . levofloxacin  750 mg Oral Daily  . loratadine  10 mg Oral Daily  . methylPREDNISolone (SOLU-MEDROL) injection  40 mg Intravenous Q12H  . pantoprazole (PROTONIX) IV  40 mg Intravenous Q12H  . simvastatin  40 mg Oral QHS  . traZODone  150 mg Oral QHS  . varenicline  1 mg Oral BID   Continuous Infusions: . famotidine (PEPCID) IV       LOS: 4 days   Kerney Elbe, DO [facility] PAGER is on AMION  If 7PM-7AM, please contact night-coverage [email or web address]

## 2019-10-31 ENCOUNTER — Inpatient Hospital Stay (HOSPITAL_COMMUNITY): Payer: 59

## 2019-10-31 DIAGNOSIS — J4551 Severe persistent asthma with (acute) exacerbation: Secondary | ICD-10-CM | POA: Diagnosis not present

## 2019-10-31 DIAGNOSIS — J441 Chronic obstructive pulmonary disease with (acute) exacerbation: Secondary | ICD-10-CM

## 2019-10-31 DIAGNOSIS — J45901 Unspecified asthma with (acute) exacerbation: Secondary | ICD-10-CM | POA: Diagnosis not present

## 2019-10-31 LAB — COMPREHENSIVE METABOLIC PANEL
ALT: 12 U/L (ref 0–44)
AST: 11 U/L — ABNORMAL LOW (ref 15–41)
Albumin: 3.3 g/dL — ABNORMAL LOW (ref 3.5–5.0)
Alkaline Phosphatase: 73 U/L (ref 38–126)
Anion gap: 8 (ref 5–15)
BUN: 29 mg/dL — ABNORMAL HIGH (ref 8–23)
CO2: 23 mmol/L (ref 22–32)
Calcium: 7.9 mg/dL — ABNORMAL LOW (ref 8.9–10.3)
Chloride: 104 mmol/L (ref 98–111)
Creatinine, Ser: 0.78 mg/dL (ref 0.61–1.24)
GFR calc Af Amer: >60 mL/min (ref >60)
GFR calc non Af Amer: >60 mL/min (ref >60)
Glucose, Bld: 149 mg/dL — ABNORMAL HIGH (ref 70–99)
Potassium: 4.2 mmol/L (ref 3.5–5.1)
Sodium: 135 mmol/L (ref 135–145)
Total Bilirubin: 0.8 mg/dL (ref 0.3–1.2)
Total Protein: 5.5 g/dL — ABNORMAL LOW (ref 6.5–8.1)

## 2019-10-31 LAB — GLUCOSE, CAPILLARY
Glucose-Capillary: 114 mg/dL — ABNORMAL HIGH (ref 70–99)
Glucose-Capillary: 115 mg/dL — ABNORMAL HIGH (ref 70–99)
Glucose-Capillary: 133 mg/dL — ABNORMAL HIGH (ref 70–99)
Glucose-Capillary: 160 mg/dL — ABNORMAL HIGH (ref 70–99)
Glucose-Capillary: 178 mg/dL — ABNORMAL HIGH (ref 70–99)
Glucose-Capillary: 181 mg/dL — ABNORMAL HIGH (ref 70–99)
Glucose-Capillary: 215 mg/dL — ABNORMAL HIGH (ref 70–99)
Glucose-Capillary: 215 mg/dL — ABNORMAL HIGH (ref 70–99)

## 2019-10-31 LAB — MAGNESIUM: Magnesium: 2.2 mg/dL (ref 1.7–2.4)

## 2019-10-31 LAB — PHOSPHORUS: Phosphorus: 4.1 mg/dL (ref 2.5–4.6)

## 2019-10-31 MED ORDER — PANTOPRAZOLE SODIUM 40 MG PO TBEC
40.0000 mg | DELAYED_RELEASE_TABLET | Freq: Two times a day (BID) | ORAL | Status: DC
  Administered 2019-10-31 – 2019-11-01 (×2): 40 mg via ORAL
  Filled 2019-10-31 (×2): qty 1

## 2019-10-31 MED ORDER — PREDNISONE 20 MG PO TABS
40.0000 mg | ORAL_TABLET | Freq: Every day | ORAL | Status: DC
  Administered 2019-10-31 – 2019-11-01 (×2): 40 mg via ORAL
  Filled 2019-10-31 (×2): qty 2

## 2019-10-31 MED ORDER — FAMOTIDINE 20 MG PO TABS
20.0000 mg | ORAL_TABLET | Freq: Every day | ORAL | Status: DC
  Administered 2019-10-31: 20 mg via ORAL
  Filled 2019-10-31: qty 1

## 2019-10-31 NOTE — Progress Notes (Signed)
PROGRESS NOTE    James [name]  [id number] DOB: 08-25-1955 DOA: 10/26/2019 PCP: [name], MD  Brief Narrative: 64 year old male with history of tobacco abuse, asthma, type 1 diabetes mellitus, dyslipidemia, CAD admitted on 4/9 with asthma exacerbation, had very slow, poor clinical response, pulmonary was consulted, GERD also suspected, PPI added an esophagram ordered   Assessment & Plan:   Severe asthma exacerbation Acute hypoxic respiratory failure Seasonal allergies Possible GERD worsening cough -Influenza PCR and Covid are negative, chest x-ray with clear lungs -Appreciate pulmonary consult -CT angiogram was negative for PE -Continue systemic steroids, was on IV Solu-Medrol, now changed to prednisone and slow taper -Levaquin for 5 days, [place] 5 today -Continues to have severe productive cough, and wheezing -Continue PPI twice daily, Tessalon Perles and Mucinex, also on Hycodan cough syrup -Await esophagram -Will need close pulmonary follow-up with PFTs -Wean O2, ambulate  Type 1 diabetes mellitus -Continue insulin pump in-house -Diabetes coordinator following  CAD -Continue aspirin and simvastatin  Tobacco use disorder -Counseled, continue -Continue Varenicline 1 mg po BID along with Nicotine Polacrilex 2 mg po   Anxiety -Stable, monitor, no longer taking hydroxyzine  DVT prophylaxis: Lovenox Code Status: Full code Family Communication: No family at bedside, discussed with patient Disposition Plan: Patient is from home, plan to discharge home when respiratory symptoms, severe wheezing and cough is better    Consultants:   pulmonary   Procedures:   Antimicrobials:    Subjective: -Feels a little better, still with productive cough, wheezing, dyspnea with minimal activity  Objective: Vitals:   10/30/19 2117 10/30/19 2224 10/31/19 0604 10/31/19 0847  BP:  136/74 (!) 147/79   Pulse:  78 73   Resp:  17 18   Temp:  98.1 F (36.7 C) 97.6 F (36.4 C)     TempSrc:  Oral Oral   SpO2: 92% 94% 96% 93%   No intake or output data in the 24 hours ending 10/31/19 1312 There were no vitals filed for this visit.  Examination:  General exam: Pleasant male sitting up in bed, uncomfortable appearing Respiratory system: Poor air movement, scattered expiratory wheezes, few rhonchi  cardiovascular system: S1 & S2 heard, regular rhythm, tachycardic  gastrointestinal system: Abdomen is nondistended, soft and nontender.Normal bowel sounds heard. Central nervous system: Alert and oriented. No focal neurological deficits. Extremities: No edema Skin: No rashes on exposed skin Psychiatry: Judgement and insight appear normal. Mood & affect appropriate.     Data Reviewed:   CBC: Recent Labs  Lab 10/26/19 1019 10/27/19 0546 10/28/19 0550 10/29/19 0521 10/30/19 0543  WBC 5.8 6.4 8.4 9.4 9.4  NEUTROABS 4.1  --  7.6 8.6* 8.5*  HGB 13.5 14.4 14.2 13.3 14.1  HCT 38.2* 40.6 40.1 38.4* 40.3  MCV 92.9 92.3 92.6 91.9 92.0  PLT 233 268 241 270 [id number]   Basic Metabolic Panel: Recent Labs  Lab 10/27/19 0546 10/28/19 0550 10/29/19 0521 10/30/19 0543 10/31/19 0537  NA 138 136 134* 135 135  K 4.4 4.2 4.0 4.4 4.2  CL 104 105 101 102 104  CO2 24 23 23 22 23   GLUCOSE 164* 165* 175* 229* 149*  BUN 22 28* 31* 31* 29*  CREATININE 0.75 0.78 0.83 0.82 0.78  CALCIUM 8.9 8.6* 8.3* 8.4* 7.9*  MG  --  2.2 2.3 2.3 2.2  PHOS  --  3.4 3.5 4.1 4.1   GFR: Estimated Creatinine Clearance: 93.3 mL/min (by C-G formula based on SCr of 0.78 mg/dL). Liver Function Tests: Recent Labs  Lab 10/28/19 0550 10/29/19 0521 10/30/19 0543 10/31/19 0537  AST 13* 12* 13* 11*  ALT 15 13 13 12   ALKPHOS 82 76 75 73  BILITOT 1.0 0.4 0.7 0.8  PROT 6.1* 5.9* 5.8* 5.5*  ALBUMIN 3.6 3.6 3.6 3.3*   No results for input(s): LIPASE, AMYLASE in the last 168 hours. No results for input(s): AMMONIA in the last 168 hours. Coagulation Profile: No results for input(s): INR, PROTIME in  the last 168 hours. Cardiac Enzymes: No results for input(s): CKTOTAL, CKMB, CKMBINDEX, TROPONINI in the last 168 hours. BNP (last 3 results) No results for input(s): PROBNP in the last 8760 hours. HbA1C: No results for input(s): HGBA1C in the last 72 hours. CBG: Recent Labs  Lab 10/30/19 2025 10/31/19 0030 10/31/19 0424 10/31/19 0746 10/31/19 1136  GLUCAP 74 178* 160* 115* 133*   Lipid Profile: No results for input(s): CHOL, HDL, LDLCALC, TRIG, CHOLHDL, LDLDIRECT in the last 72 hours. Thyroid Function Tests: No results for input(s): TSH, T4TOTAL, FREET4, T3FREE, THYROIDAB in the last 72 hours. Anemia Panel: No results for input(s): VITAMINB12, FOLATE, FERRITIN, TIBC, IRON, RETICCTPCT in the last 72 hours. Urine analysis:    Component Value Date/Time   COLORURINE YELLOW 04/10/2017 [place] 04/10/2017 0743   LABSPEC 1.015 04/10/2017 0743   PHURINE 6.0 04/10/2017 0743   GLUCOSEU >=500 (A) 04/10/2017 0743   GLUCOSEU 500 (?) 05/08/2010 [place] 04/10/2017 0743   BILIRUBINUR NEGATIVE 04/10/2017 0743   KETONESUR NEGATIVE 04/10/2017 0743   PROTEINUR NEGATIVE 04/10/2017 0743   UROBILINOGEN 0.2 04/11/2013 0008   NITRITE NEGATIVE 04/10/2017 0743   LEUKOCYTESUR NEGATIVE 04/10/2017 0743   Sepsis Labs: @LABRCNTIP (procalcitonin:4,lacticidven:4)  ) Recent Results (from the past 240 hour(s))  Respiratory Panel by RT PCR (Flu A&B, Covid) - Nasopharyngeal Swab     Status: None   Collection Time: 10/26/19 10:14 AM   Specimen: Nasopharyngeal Swab  Result Value Ref Range Status   SARS Coronavirus 2 by RT PCR NEGATIVE NEGATIVE Final    Comment: (NOTE) SARS-CoV-2 target nucleic acids are NOT DETECTED. The SARS-CoV-2 RNA is generally detectable in upper respiratoy specimens during the acute phase of infection. The lowest concentration of SARS-CoV-2 viral copies this assay can detect is 131 copies/mL. A negative result does not preclude  SARS-Cov-2 infection and should not be used as the sole basis for treatment or other patient management decisions. A negative result may occur with  improper specimen collection/handling, submission of specimen other than nasopharyngeal swab, presence of viral mutation(s) within the areas targeted by this assay, and inadequate number of viral copies (<131 copies/mL). A negative result must be combined with clinical observations, patient history, and epidemiological information. The expected result is Negative. Fact Sheet for Patients:  PinkCheek.be Fact Sheet for Healthcare Providers:  GravelBags.it This test is not yet ap proved or cleared by the [place] FDA and  has been authorized for detection and/or diagnosis of SARS-CoV-2 by FDA under an Emergency Use Authorization (EUA). This EUA will remain  in effect (meaning this test can be used) for the duration of the COVID-19 declaration under Section 564(b)(1) of the Act, 21 U.S.C. section 360bbb-3(b)(1), unless the authorization is terminated or revoked sooner.    Influenza A by PCR NEGATIVE NEGATIVE Final   Influenza B by PCR NEGATIVE NEGATIVE Final    Comment: (NOTE) The Xpert Xpress SARS-CoV-2/FLU/RSV assay is intended as an aid in  the diagnosis of influenza from Nasopharyngeal swab specimens and  should not  be used as a sole basis for treatment. Nasal washings and  aspirates are unacceptable for Xpert Xpress SARS-CoV-2/FLU/RSV  testing. Fact Sheet for Patients: PinkCheek.be Fact Sheet for Healthcare Providers: GravelBags.it This test is not yet approved or cleared by the [place] FDA and  has been authorized for detection and/or diagnosis of SARS-CoV-2 by  FDA under an Emergency Use Authorization (EUA). This EUA will remain  in effect (meaning this test can be used) for the duration of the  Covid-19  declaration under Section 564(b)(1) of the Act, 21  U.S.C. section 360bbb-3(b)(1), unless the authorization is  terminated or revoked. Performed at [facility], [place] [address], [place], [place] [address]          Radiology Studies: CT ANGIO CHEST PE W OR WO CONTRAST  Result Date: 10/29/2019 CLINICAL DATA:  Chronic dyspnea EXAM: CT ANGIOGRAPHY CHEST WITH CONTRAST TECHNIQUE: Multidetector CT imaging of the chest was performed using the standard protocol during bolus administration of intravenous contrast. Multiplanar CT image reconstructions and MIPs were obtained to evaluate the vascular anatomy. CONTRAST:  115mL OMNIPAQUE IOHEXOL 350 MG/ML SOLN COMPARISON:  Chest x-ray from earlier in the same [facility], CT from 07/03/2019. FINDINGS: Cardiovascular: Thoracic aorta and its branches demonstrate mild atherosclerotic calcifications. No aneurysmal dilatation or dissection is seen. No cardiac enlargement is seen. Coronary calcifications are noted. The pulmonary artery is slightly limited in evaluation due to the timing of the contrast bolus although no definitive central pulmonary embolus is seen. Mediastinum/Nodes: Thoracic inlet is within normal limits. No hilar or mediastinal adenopathy is noted. The esophagus is within normal limits. Lungs/Pleura: The lungs are well aerated bilaterally. Scattered scarring is noted in the bases bilaterally. There are patchy ground-glass opacities likely postinflammatory in nature within the left upper lobe. The previously seen ground-glass opacity has resolved in new changes are seen. Calcified granuloma is noted in the left lower lobe. Some mild bronchial wall thickening is noted with some areas of mucous plugging. This is stable from the prior exam. Upper Abdomen: Changes of prior granulomatous disease are noted in the spleen. Small nonobstructing left renal calculi are noted. Musculoskeletal: Degenerative changes of the thoracic spine are seen. No acute  rib abnormality is noted. Old rib fractures are seen on the left. Review of the MIP images confirms the above findings. IMPRESSION: Stable changes of central bronchial thickening likely related to bronchitis. Scattered ground-glass opacities some of which are new from the prior CT examination. Some interval resolution of previously seen opacities is noted consistent with the waxing and waning inflammatory process. No evidence of pulmonary emboli. Changes of prior granulomatous disease. Aortic Atherosclerosis (ICD10-I70.0). Electronically Signed   By: Inez Catalina M.D.   On: 10/29/2019 15:08   DG CHEST PORT 1 VIEW  Result Date: 10/30/2019 CLINICAL DATA:  Shortness of breath EXAM: PORTABLE CHEST 1 VIEW COMPARISON:  Yesterday FINDINGS: Mild streaky density at the lung bases attributed to atelectasis. There is prominent bronchitis by chest CT yesterday no edema, effusion, or pneumothorax. Normal heart size. IMPRESSION: Mild increase in lower lobe atelectasis. Electronically Signed   By: Monte Fantasia M.D.   On: 10/30/2019 07:41        Scheduled Meds: . arformoterol  15 mcg Nebulization BID  . aspirin  81 mg Oral Daily  . azelastine  2 spray Each Nare BID  . budesonide (PULMICORT) nebulizer solution  0.5 mg Nebulization BID  . enoxaparin (LOVENOX) injection  40 mg Subcutaneous Q24H  . famotidine  20 mg  Oral QHS  . finasteride  5 mg Oral Daily  . guaiFENesin  1,200 mg Oral BID  . ipratropium-albuterol  3 mL Nebulization TID  . levofloxacin  750 mg Oral Daily  . loratadine  10 mg Oral Daily  . pantoprazole  40 mg Oral BID AC  . predniSONE  40 mg Oral Q breakfast  . simvastatin  40 mg Oral QHS  . traZODone  150 mg Oral QHS  . varenicline  1 mg Oral BID   Continuous Infusions:   LOS: 5 days    Time spent: 62min  Domenic Polite, MD [facility]  10/31/2019, 1:12 PM

## 2019-10-31 NOTE — Progress Notes (Signed)
Pharmacy Antibiotic Note  James [name] is a 64 y.o. male with hx COPD presented to the ED on 10/26/2019 with c/o SOB. Home regimen of azithromycin was resumed on admission. Pharmacy was consulted on 4/11 to change abx to Levaquin. Today is [place] #4 of Levaquin.  CT w/ bronchitis, but w/o pneumonia, Afebrile, WBC remains wnl.  Plan: Continue levaquin 750 mg PO q24h Follow up duration of therapy.  ________________________________  Temp (24hrs), Avg:97.8 F (36.6 C), Min:97.6 F (36.4 C), Max:98.1 F (36.7 C)  Recent Labs  Lab 10/26/19 1019 10/26/19 1019 10/27/19 0546 10/28/19 0550 10/29/19 0521 10/30/19 0543 10/31/19 0537  WBC 5.8  --  6.4 8.4 9.4 9.4  --   CREATININE 0.70   < > 0.75 0.78 0.83 0.82 0.78   < > = values in this interval not displayed.    Estimated Creatinine Clearance: 93.3 mL/min (by C-G formula based on SCr of 0.78 mg/dL).    Allergies  Allergen Reactions  . Augmentin [Amoxicillin-Pot Clavulanate] Nausea And Vomiting and Other (See Comments)    Did it involve swelling of the face/tongue/throat, SOB, or low BP? No Did it involve sudden or severe rash/hives, skin peeling, or any reaction on the inside of your mouth or nose? No Did you need to seek medical attention at a hospital or doctor's office? No When did it last happen?5+ years If all above answers are "NO", may proceed with cephalosporin use.   James [name] [Escitalopram Oxalate] Itching     Thank you for allowing pharmacy to be a part of this patient's care.  Gretta Arab PharmD, BCPS Pager: [phone] 10/31/2019 8:45 AM

## 2019-10-31 NOTE — Progress Notes (Addendum)
   NAME:  James [name], MRN:  [id number], DOB:  January 24, 1956, LOS: 5 ADMISSION DATE:  10/26/2019, CONSULTATION DATE:  4/12 REFERRING MD:  Alfredia Ferguson, CHIEF COMPLAINT:  Dyspnea and asthmatic exacerbation    Brief History   64 year old male with history of asthmatic bronchitis admitted on 4/9 with asthmatic exacerbation Pulmonary asked to evaluate on 4/12 as continued to demonstrate significant symptom burden in spite of systemic steroids, inhaled bronchodilators and inhaled corticosteroids as well as empiric antibiotics  Past Medical History  Asthma, gastroesophageal reflux disease, hyperlipidemia, coronary artery disease, type 1 diabetes. Former smoker Had a history of not taking medications due to cost restraints Last documented exacerbations April 2020, February 2020, December 2020.  Significant Hospital Events   4/9 admitted. Started on steroids and abx cont. Cont BDs 4/10 steroids increased 4/11 added levaquin  4/12 pulm consulted. Added PPI BID  Consults:  pulm consulted.   Procedures:    Significant Diagnostic Tests:  CT chest 4/12>>>Stable changes of central bronchial thickening likely related to bronchitis.Scattered ground-glass opacities some of which are new from the prior CT examination. Some interval resolution of previously seen opacities is noted consistent with the waxing and waning inflammatory process.No evidence of pulmonary emboli. Changes of prior granulomatous disease.  Micro Data:    Antimicrobials:  azith 4/5>>4/11 levaquin 4/11>>>  Interim history/subjective:  Feels better  Got a good night sleep  Objective   Blood pressure (Abnormal) 147/79, pulse 73, temperature 97.6 F (36.4 C), temperature source Oral, resp. rate 18, SpO2 93 %.       No intake or output data in the 24 hours ending 10/31/19 1043 There were no vitals filed for this visit.  Examination: General resting in bed no distress HENT still has upper airway wheeze but improved Pulm exp wheeze  improved Card RRR  abd not tender Ext no edema  Neuro intact   Resolved Hospital Problem list     Assessment & Plan:   Asthmatic exacerbation LPR GERD Seasonal allergies Cough     Asthmatic exacerbation, has history of allergic rhinitis, suspect likely exacerbated by seasonal allergies-->w/ symptom resolution being delayed by reflux and associated cough causing worsening upper airway irritation    Discussion -CT was neg for PE suspect element of upper airway LPR contributing. W/ his cough and active reflux suspect that these are sig contributing factors to the upper airway irritation.    Awaiting esophagram ? Diabetic associated esophageal dysmotility being a sig factor here   Plan/recommendation Cont systemic steroids, change to pred and slow taper  Cont reflux precautions Would stop levaquin at [place] 5 Cont BD therapy (home on advair and PRN SABA) Cont nasal antihistamine Cont cough suppression  Cont PPI BID and HS pepcid (would cont PPI at BID while on prednisone, then change to protonix am and cont pepcid at HS) Reflux precautions  Await esophagram   OK for dc from our standpoint after esophagram   F/u April 23 w Tonia [facility] at [place] ACNP-BC [place] Pager # [phone] OR # [phone] if no answer

## 2019-11-01 ENCOUNTER — Other Ambulatory Visit: Payer: Self-pay | Admitting: Internal Medicine

## 2019-11-01 ENCOUNTER — Other Ambulatory Visit: Payer: Self-pay | Admitting: Pulmonary Disease

## 2019-11-01 DIAGNOSIS — R1310 Dysphagia, unspecified: Secondary | ICD-10-CM

## 2019-11-01 DIAGNOSIS — J4551 Severe persistent asthma with (acute) exacerbation: Secondary | ICD-10-CM | POA: Diagnosis not present

## 2019-11-01 LAB — BASIC METABOLIC PANEL
Anion gap: 6 (ref 5–15)
BUN: 26 mg/dL — ABNORMAL HIGH (ref 8–23)
CO2: 23 mmol/L (ref 22–32)
Calcium: 8 mg/dL — ABNORMAL LOW (ref 8.9–10.3)
Chloride: 106 mmol/L (ref 98–111)
Creatinine, Ser: 0.74 mg/dL (ref 0.61–1.24)
GFR calc Af Amer: >60 mL/min (ref >60)
GFR calc non Af Amer: >60 mL/min (ref >60)
Glucose, Bld: 156 mg/dL — ABNORMAL HIGH (ref 70–99)
Potassium: 4.1 mmol/L (ref 3.5–5.1)
Sodium: 135 mmol/L (ref 135–145)

## 2019-11-01 LAB — CBC
HCT: 39.6 % (ref 39.0–52.0)
Hemoglobin: 14.1 g/dL (ref 13.0–17.0)
MCH: 32.3 pg (ref 26.0–34.0)
MCHC: 35.6 g/dL (ref 30.0–36.0)
MCV: 90.8 fL (ref 80.0–100.0)
Platelets: 242 10*3/uL (ref 150–400)
RBC: 4.36 MIL/uL (ref 4.22–5.81)
RDW: 11.5 % (ref 11.5–15.5)
WBC: 8.6 10*3/uL (ref 4.0–10.5)
nRBC: 0 % (ref 0.0–0.2)

## 2019-11-01 LAB — GLUCOSE, CAPILLARY
Glucose-Capillary: 138 mg/dL — ABNORMAL HIGH (ref 70–99)
Glucose-Capillary: 151 mg/dL — ABNORMAL HIGH (ref 70–99)
Glucose-Capillary: 141 mg/dL — ABNORMAL HIGH (ref 70–99)
Glucose-Capillary: 151 mg/dL — ABNORMAL HIGH (ref 70–99)

## 2019-11-01 MED ORDER — PANTOPRAZOLE SODIUM 40 MG PO TBEC: 40.0000 mg | DELAYED_RELEASE_TABLET | Freq: Every day | ORAL | Status: DC

## 2019-11-01 MED ORDER — PANTOPRAZOLE SODIUM 40 MG PO TBEC: 40.0000 mg | DELAYED_RELEASE_TABLET | Freq: Two times a day (BID) | ORAL | 0 refills | Status: DC

## 2019-11-01 MED ORDER — GUAIFENESIN ER 600 MG PO TB12: 1200.0000 mg | ORAL_TABLET | Freq: Two times a day (BID) | ORAL | 0 refills | Status: DC

## 2019-11-01 MED ORDER — SENNOSIDES-DOCUSATE SODIUM 8.6-50 MG PO TABS
1.0000 | ORAL_TABLET | Freq: Two times a day (BID) | ORAL | Status: DC
  Administered 2019-11-01: 1 via ORAL
  Filled 2019-11-01: qty 1

## 2019-11-01 MED ORDER — PREDNISONE 10 MG PO TABS: ORAL_TABLET | ORAL | 0 refills | Status: DC

## 2019-11-01 MED ORDER — BENZONATATE 100 MG PO CAPS: 100.0000 mg | ORAL_CAPSULE | Freq: Three times a day (TID) | ORAL | 0 refills | Status: AC

## 2019-11-01 MED FILL — BENZONATATE 100 MG CAPS: 100 | 10 days supply | Qty: 30 | Fill #0

## 2019-11-01 MED FILL — predniSONE 10 MG TABS: 10 | 12 days supply | Qty: 30 | Fill #0

## 2019-11-01 MED FILL — PANTOPRAZOLE SOD DR 40 MG T: 40 | 30 days supply | Qty: 60 | Fill #0

## 2019-11-01 NOTE — Evaluation (Signed)
Clinical/Bedside Swallow Evaluation Patient Details  Name: James [name] MRN: [id number] Date of Birth: 1955/10/03  Today's Date: 11/01/2019 Time: SLP Start Time (ACUTE ONLY): 1330 SLP Stop Time (ACUTE ONLY): 1430 SLP Time Calculation (min) (ACUTE ONLY): 60 min  Past Medical History:  Past Medical History:  Diagnosis Date  . ANXIETY 04/03/2007  . Anxiety   . ASTHMA 09/06/2008  . Asthma   . ASTHMATIC BRONCHITIS, ACUTE 10/25/2008  . Bladder neck obstruction   . CARPAL TUNNEL SYNDROME, BILATERAL 07/31/2007   issues resolved, no surgery  . Cervical disc disease   . Chronic bronchitis ([place])    "get it about q yr" (02/12/2014)  . COPD (chronic obstructive pulmonary disease) ([place])   . CORONARY ARTERY DISEASE 04/03/2007  . DEPRESSION 04/03/2007  . Depression   . DIABETES MELLITUS, TYPE I 04/03/2007  . Diabetic retinopathy associated with diabetes mellitus due to underlying condition ([place]) 04/03/2007  . DM W/EYE MANIFESTATIONS, TYPE I, UNCONTROLLED 04/04/2007  . 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 ED (erectile dysfunction)   . History of kidney stones   . HYPERLIPIDEMIA 04/04/2007  . Pneumonia    "several times and again today" (02/13/2104)  . Renal insufficiency   . Seizures ([place])    "insulin seizure from time to time; none in the last couple years" (02/12/2014)  . Spinal stenosis    Past Surgical History:  Past Surgical History:  Procedure Laterality Date  . ANTERIOR CERVICAL DECOMP/DISCECTOMY FUSION  2000   "couple screws and a plate"  . ANTERIOR CERVICAL DECOMP/DISCECTOMY FUSION N/A 06/24/2016   Procedure: ANTERIOR CERVICAL DECOMPRESSION/DISCECTOMY FUSION CERVICAL FOUR - CERVICAL FIVE, CERVICAL FIVE - CERVICAL SIX; REMOVAL TETHER CERVICAL PLATE;  Surgeon: Jovita Gamma, MD;  Location: [facility];  Service: Neurosurgery;  Laterality: N/A;  ANTERIOR CERVICAL DECOMPRESSION/DISCECTOMY FUSION CERVICAL FOUR - CERVICAL FIVE, CERVICAL FIVE - CERVICAL SIX; REMOVAL TETHER CERVICAL PLATE  .  APPENDECTOMY    . BACK SURGERY    . CARDIAC CATHETERIZATION  1990's  . CATARACT EXTRACTION W/ INTRAOCULAR LENS  IMPLANT, BILATERAL Bilateral   . CYSTOSCOPY WITH RETROGRADE PYELOGRAM, URETEROSCOPY AND STENT PLACEMENT Bilateral 04/06/2013   Procedure: BILATERAL CYSTOSCOPY WITH RETROGRADE PYELOGRAMS, STENT PLACEMENTS AND LEFT URETEROSCOPY AND STONE REMOVAL;  Surgeon: Alexis Frock, MD;  Location: [facility];  Service: Urology;  Laterality: Bilateral;  . CYSTOSCOPY WITH STENT PLACEMENT Right 04/12/2013   Procedure: CYSTOSCOPY WITH STENT PLACEMENT;  Surgeon: Alexis Frock, MD;  Location: [facility];  Service: Urology;  Laterality: Right;  . CYSTOSCOPY/RETROGRADE/URETEROSCOPY Bilateral 04/12/2013   Procedure: CYSTOSCOPY/RETROGRADE/URETEROSCOPY;  Surgeon: Alexis Frock, MD;  Location: [facility];  Service: Urology;  Laterality: Bilateral;  RIGHT RETROGRADE   . HOLMIUM LASER APPLICATION Left [id number]   Procedure: HOLMIUM LASER APPLICATION;  Surgeon: Alexis Frock, MD;  Location: [facility];  Service: Urology;  Laterality: Left;  . LUMBAR LAMINECTOMY/DECOMPRESSION MICRODISCECTOMY N/A 04/20/2019   Procedure: Lumbar microdisectomy and decompression L5-S1 left;  Surgeon: Latanya Maudlin, MD;  Location: [facility];  Service: Orthopedics;  Laterality: N/A;  76min  . LYMPH NODE DISSECTION  ~ 1960   groin  . stress cardiolite  09/06/2002  . TONSILLECTOMY    . VITRECTOMY Bilateral    HPI:  64yo male admitted 10/26/19 PMH: tobacco abuse, asthma, DM1, dyslipidemia, CAD admitted on 4/9 with asthma exacerbation, GERD also suspected, PPI added an esophagram ordered   Assessment / Plan / Recommendation Clinical Impression  Pt seen at bedside for clinical swallow evaluation. CN exam unremarkable. Pt exhibits intermittent cough in the  absence of po trials. Pt accepted trials of thin liquid, puree, and solid textures. 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  Recent esophagram revealed aspiration of barium. Recommend repeat MBS to objectively assess swallow function. Pt is anticipating DC today, so will plan to repeat MBS on an outpatient basis. Contact has been made with Dr. Broadus John and Wyn Quaker, NP to facilitate this process.    SLP Visit Diagnosis: Dysphagia, unspecified (R13.10)    Aspiration Risk  Moderate aspiration risk    Diet Recommendation Regular;Thin liquid   Liquid Administration via: Cup Medication Administration: Whole meds with liquid Supervision: Patient able to self feed Compensations: Minimize environmental distractions;Slow rate;Small sips/bites Postural Changes: Seated upright at 90 degrees;Remain upright for at least 30 minutes after po intake    Other  Recommendations Oral Care Recommendations: Oral care BID   Follow up Recommendations Other (comment)(outpatient MBS)          Prognosis Prognosis for Safe Diet Advancement: Good      Swallow Study   General Date of Onset: 10/26/19 HPI: 64yo male admitted 10/26/19 PMH: tobacco abuse, asthma, DM1, dyslipidemia, CAD admitted on 4/9 with asthma exacerbation, GERD also suspected, PPI added an esophagram ordered Type of Study: Bedside Swallow Evaluation Previous Swallow Assessment: MBS 01/28/17 = impaired timing of oral transit and laryngeal closure, likely chronic silent aspiration of thin liquids following ACDF surgery. Referred for outpatient ST, but was seen for eval only despite recommendation for treatment Diet Prior to this Study: Regular;Thin liquids Temperature Spikes Noted: No Respiratory Status: Nasal cannula History of Recent Intubation: No Behavior/Cognition: Alert;Cooperative;Pleasant mood Oral Cavity Assessment: Within Functional Limits Oral Care Completed by SLP: No Oral Cavity - Dentition: Adequate natural dentition Vision: Functional for self-feeding Self-Feeding Abilities: Able to feed self Patient Positioning: Upright in bed Baseline Vocal  Quality: Normal Volitional Cough: Strong Volitional Swallow: Able to elicit    Oral/Motor/Sensory Function Overall Oral Motor/Sensory Function: Within functional limits   Ice Chips Ice chips: Not tested   Thin Liquid Thin Liquid: Within functional limits    Nectar Thick Nectar Thick Liquid: Not tested   Honey Thick Honey Thick Liquid: Not tested   Puree Puree: Within functional limits Presentation: Spoon   Solid     Solid: Within functional limits Presentation: [place] [facility]. [facility], [facility], [place] Speech Language Pathologist Office: [phone] Pager: [phone]  Shonna Chock 11/01/2019,2:35 PM

## 2019-11-01 NOTE — Progress Notes (Signed)
SATURATION QUALIFICATIONS: (This note is used to comply with regulatory documentation for home oxygen)  Patient Saturations on Room Air at Rest = 92%  Patient Saturations on Room Air while Ambulating = 93%  Patient Saturations on 2 Liters of oxygen while Ambulating = 94 %  Please briefly explain why patient needs home oxygen:

## 2019-11-01 NOTE — Discharge Summary (Signed)
Physician Discharge Summary  James [name] [id number] DOB: [birth year] DOA: 10/26/2019  PCP: [name], MD  Admit date: 10/26/2019 Discharge date: 11/01/2019  Time spent: 45 minutes  Recommendations for Outpatient Follow-up:  1. Modified barium swallow scheduled as outpatien 2. [place] pulmonary Wyn Quaker on 4/23   Discharge Diagnoses:  Asthma exacerbation Dysphagia Ongoing aspiration   HLD (hyperlipidemia)   CAD (coronary artery disease)   Chronic asthmatic bronchitis (HCC)   Cough   TOBACCO USE, QUIT   Tobacco abuse   DM type 1 (diabetes mellitus, type 1) (HCC)   Asthma exacerbation   Acute asthma exacerbation   Discharge Condition: Stable  Diet recommendation: Diabetic, heart healthy  There were no vitals filed for this visit.  History of present illness:  64 year old male with history of tobacco abuse, asthma, type 1 diabetes mellitus, dyslipidemia, CAD admitted on 4/9 with asthma exacerbation, had very slow, poor clinical response  Hospital Course:   Severe asthma exacerbation Acute hypoxic respiratory failure Dysphagia, aspiration -Influenza PCR and Covid are negative, chest x-ray with clear lungs -CT angiogram was negative for PE -Slow clinical response to steroids, antibiotics, nebs , subsequently seen by pulmonary in consultation, esophagram ordered which showed concern for pharyngeal dysphagia with aspiration of barium, patient has a history of chronic silent aspiration based on modified barium swallow in 2018 following ACDF  -Finally had slow clinical response with numerous cough suppressants, PPI in addition to above therapy  -SLP evaluation was ordered this morning, MBS unfortunately could not be done until tomorrow and patient is extremely anxious to go home today hence this is being scheduled as outpatient  -We will need to continue aspiration precautions -Discharged on prednisone slow taper along with Tessalon Perles, Mucinex, Hycodan cough  syrup -Close pulmonary follow-up arranged for next week on 4/23, hopefully will have modified barium swallow completed in the interim  Type 1 diabetes mellitus -Continued insulin pump in-house  CAD -Continue aspirin and simvastatin  Tobacco use disorder -Counseled, continue -Continue Varenicline 1 mg po BID along with Nicotine Polacrilex 2 mg po   Anxiety -Stable, monitor, no longer taking hydroxyzine  Consultants:   pulmonary   Discharge Exam: Vitals:   11/01/19 0814 11/01/19 1343  BP:  136/87  Pulse:  88  Resp:  18  Temp:  (!) 97.5 F (36.4 C)  SpO2: 97% 93%    General: AAOx Cardiovascular: S1-S2, regular rate rhythm Respiratory: Improved air movement, scattered rhonchi and expiratory wheezes  Discharge Instructions   Discharge Instructions    Diet - low sodium heart healthy   Complete by: As directed    Increase activity slowly   Complete by: As directed      Allergies as of 11/01/2019      Reactions   Augmentin [amoxicillin-pot Clavulanate] Nausea And Vomiting, Other (See Comments)   Did it involve swelling of the face/tongue/throat, SOB, or low BP? No Did it involve sudden or severe rash/hives, skin peeling, or any reaction on the inside of your mouth or nose? No Did you need to seek medical attention at a hospital or doctor's office? No When did it last happen?5+ years If all above answers are "NO", may proceed with cephalosporin use.   Lexapro [escitalopram Oxalate] Itching      Medication List    STOP taking these medications   azithromycin 250 MG tablet Commonly known as: ZITHROMAX   hydrOXYzine 25 MG capsule Commonly known as: Vistaril   nicotine 21 mg/24hr patch Commonly known as: NICODERM CQ -  dosed in mg/24 hours   ondansetron 4 MG tablet Commonly known as: ZOFRAN     TAKE these medications   acetaminophen 500 MG tablet Commonly known as: TYLENOL Take 1,000 mg by mouth every 6 (six) hours as needed for mild pain or  headache.   AIRBORNE PO Take 3 tablets by mouth daily.   albuterol 108 (90 Base) MCG/ACT inhaler Commonly known as: VENTOLIN HFA INHALE 2 PUFFS BY MOUTH INTO THE LUNGS EVERY 6 HOURS AS NEEDED FOR WHEEZING OR SHORTNESS OF BREATH. What changed: See the new instructions.   albuterol (2.5 MG/3ML) 0.083% nebulizer solution Commonly known as: PROVENTIL USE 1 VIAL VIA NEBULIZER EVERY 6 HOURS AS NEEDED FOR WHEEZING OR SHORTNESS OF BREATH What changed: See the new instructions.   benzonatate 100 MG capsule Commonly known as: Tessalon Perles Take 1 capsule (100 mg total) by mouth 3 (three) times daily for 10 days.   fexofenadine 180 MG tablet Commonly known as: ALLEGRA Take 180 mg by mouth daily.   finasteride 5 MG tablet Commonly known as: PROSCAR Take 5 mg by mouth daily.   Fluticasone-Salmeterol 100-50 MCG/DOSE Aepb Commonly known as: Advair Diskus Inhale 1 puff into the lungs 2 (two) times daily.   guaiFENesin 600 MG 12 hr tablet Commonly known as: MUCINEX Take 2 tablets (1,200 mg total) by mouth 2 (two) times daily.   HYDROcodone-homatropine 5-1.5 MG/5ML syrup Commonly known as: HYCODAN Take 5 mLs by mouth every 6 (six) hours as needed for cough.   insulin lispro 100 UNIT/ML KwikPen Junior Commonly known as: HUMALOG Inject 0.05 mLs (5 Units total) into the skin 3 (three) times daily before meals.   insulin lispro 100 UNIT/ML KwikPen Junior Commonly known as: HUMALOG Sliding scale: CBG 70 - 120: 0 units  CBG 121 - 150: 2 units  CBG 151 - 200: 3 units  CBG 201 - 250: 5 units  CBG 251 - 300: 8 units  CBG 301 - 350: 11 units  CBG 351 - 400: 15 units   Insulin Pen Needle 31G X 5 MM Misc 1 Device by Does not apply route 4 (four) times daily. For use with insulin pens   nicotine polacrilex 2 MG lozenge Commonly known as: Nicotine Mini Take 1 lozenge (2 mg total) by mouth as needed for smoking cessation.   pantoprazole 40 MG tablet Commonly known as: PROTONIX Take 1  tablet (40 mg total) by mouth 2 (two) times daily. Take Protonix twice a [place] while on prednisone and then back down to daily   polyvinyl alcohol 1.4 % ophthalmic solution Commonly known as: LIQUIFILM TEARS Place 1 drop into both eyes as needed for dry eyes.   predniSONE 10 MG tablet Commonly known as: DELTASONE Take 4 tabs po daily x 3 days; then 3 tabs daily x3 days; then 2 tabs daily x3 days; then 1 tab daily x 3 days; then stop What changed:   how much to take  how to take this  when to take this  additional instructions   simvastatin 40 MG tablet Commonly known as: ZOCOR Take 40 mg by mouth at bedtime.   traZODone 150 MG tablet Commonly known as: DESYREL Take 150 mg by mouth at bedtime.   varenicline 1 MG tablet Commonly known as: Chantix Continuing Month Pak Take 1 tablet (1 mg total) by mouth 2 (two) times daily.      Allergies  Allergen Reactions  . Augmentin [Amoxicillin-Pot Clavulanate] Nausea And Vomiting and Other (See Comments)  Did it involve swelling of the face/tongue/throat, SOB, or low BP? No Did it involve sudden or severe rash/hives, skin peeling, or any reaction on the inside of your mouth or nose? No Did you need to seek medical attention at a hospital or doctor's office? No When did it last happen?5+ years If all above answers are "NO", may proceed with cephalosporin use.   [name] [Escitalopram Oxalate] Itching   Follow-up Information    Lauraine Rinne, NP Follow up on 11/09/2019.   Specialty: Pulmonary Disease Why: 11 am  Contact information: [address] [place] [address] [phone]        [name], MD. Schedule an appointment as soon as possible for a visit in 1 week(s).   Specialty: Endocrinology Contact information: [place] [address] [phone]            The results of significant diagnostics from this hospitalization (including imaging, microbiology, ancillary and  laboratory) are listed below for reference.    Significant Diagnostic Studies: DG Chest 2 View  Result Date: 10/26/2019 CLINICAL DATA:  Shortness of breath. EXAM: CHEST - 2 VIEW COMPARISON:  June 28, 2019 FINDINGS: The heart size and mediastinal contours are within normal limits. Both lungs are clear. The visualized skeletal structures are unremarkable. IMPRESSION: No active cardiopulmonary disease. Electronically Signed   By: Constance Holster M.D.   On: 10/26/2019 03:53   CT ANGIO CHEST PE W OR WO CONTRAST  Result Date: 10/29/2019 CLINICAL DATA:  Chronic dyspnea EXAM: CT ANGIOGRAPHY CHEST WITH CONTRAST TECHNIQUE: Multidetector CT imaging of the chest was performed using the standard protocol during bolus administration of intravenous contrast. Multiplanar CT image reconstructions and MIPs were obtained to evaluate the vascular anatomy. CONTRAST:  157mL OMNIPAQUE IOHEXOL 350 MG/ML SOLN COMPARISON:  Chest x-ray from earlier in the same [place], CT from 07/03/2019. FINDINGS: Cardiovascular: Thoracic aorta and its branches demonstrate mild atherosclerotic calcifications. No aneurysmal dilatation or dissection is seen. No cardiac enlargement is seen. Coronary calcifications are noted. The pulmonary artery is slightly limited in evaluation due to the timing of the contrast bolus although no definitive central pulmonary embolus is seen. Mediastinum/Nodes: Thoracic inlet is within normal limits. No hilar or mediastinal adenopathy is noted. The esophagus is within normal limits. Lungs/Pleura: The lungs are well aerated bilaterally. Scattered scarring is noted in the bases bilaterally. There are patchy ground-glass opacities likely postinflammatory in nature within the left upper lobe. The previously seen ground-glass opacity has resolved in new changes are seen. Calcified granuloma is noted in the left lower lobe. Some mild bronchial wall thickening is noted with some areas of mucous plugging. This is stable from  the prior exam. Upper Abdomen: Changes of prior granulomatous disease are noted in the spleen. Small nonobstructing left renal calculi are noted. Musculoskeletal: Degenerative changes of the thoracic spine are seen. No acute rib abnormality is noted. Old rib fractures are seen on the left. Review of the MIP images confirms the above findings. IMPRESSION: Stable changes of central bronchial thickening likely related to bronchitis. Scattered ground-glass opacities some of which are new from the prior CT examination. Some interval resolution of previously seen opacities is noted consistent with the waxing and waning inflammatory process. No evidence of pulmonary emboli. Changes of prior granulomatous disease. Aortic Atherosclerosis (ICD10-I70.0). Electronically Signed   By: Inez Catalina M.D.   On: 10/29/2019 15:08   DG CHEST PORT 1 VIEW  Result Date: 10/30/2019 CLINICAL DATA:  Shortness of breath  EXAM: PORTABLE CHEST 1 VIEW COMPARISON:  Yesterday FINDINGS: Mild streaky density at the lung bases attributed to atelectasis. There is prominent bronchitis by chest CT yesterday no edema, effusion, or pneumothorax. Normal heart size. IMPRESSION: Mild increase in lower lobe atelectasis. Electronically Signed   By: Monte Fantasia M.D.   On: 10/30/2019 07:41   DG CHEST PORT 1 VIEW  Result Date: 10/29/2019 CLINICAL DATA:  64 year old male with history of shortness of breath. EXAM: PORTABLE CHEST 1 VIEW COMPARISON:  Chest x-ray 10/28/2019. FINDINGS: Lung volumes are normal. No consolidative airspace disease. No pleural effusions. No pneumothorax. No pulmonary nodule or mass noted. Pulmonary vasculature and the cardiomediastinal silhouette are within normal limits. IMPRESSION: No radiographic evidence of acute cardiopulmonary disease. Electronically Signed   By: Vinnie Langton M.D.   On: 10/29/2019 07:55   DG CHEST PORT 1 VIEW  Result Date: 10/28/2019 CLINICAL DATA:  Short of breath EXAM: PORTABLE CHEST 1 VIEW  COMPARISON:  None. FINDINGS: Normal mediastinum and cardiac silhouette. Chronic central bronchitic markings. Normal pulmonary vasculature. No effusion, infiltrate, or pneumothorax. Anterior cervical fusion IMPRESSION: Chronic bronchitic markings.  No acute findings. Electronically Signed   By: Suzy Bouchard M.D.   On: 10/28/2019 06:51   DG Chest Port 1 View  Result Date: 10/26/2019 CLINICAL DATA:  Cough and shortness of breath over the last week. EXAM: PORTABLE CHEST 1 VIEW COMPARISON:  Earlier same [place] FINDINGS: The heart size and mediastinal contours are within normal limits. Both lungs are clear. The visualized skeletal structures are unremarkable. IMPRESSION: No active disease. Electronically Signed   By: Nelson Chimes M.D.   On: 10/26/2019 10:41   DG ESOPHAGUS W DOUBLE CM (HD)  Result Date: 10/31/2019 CLINICAL DATA:  Cough EXAM: ESOPHOGRAM / BARIUM SWALLOW / BARIUM TABLET STUDY TECHNIQUE: Combined double contrast and single contrast examination performed using effervescent crystals, thick barium liquid, and thin barium liquid. The patient was observed with fluoroscopy swallowing a 13 mm barium sulphate tablet. FLUOROSCOPY TIME:  Fluoroscopy Time:  3 minutes 0 seconds Radiation Exposure Index (if provided by the fluoroscopic device): 2.2 mGy Number of Acquired Spot Images: 0 COMPARISON:  None. FINDINGS: There is frank tracheal aspiration of thick barium during the pharyngeal phase of swallowing. This elicited a cough response. Double contrast views were suboptimal due to some esophageal spasm particularly in the distal esophagus. This resembles narrowing in the distal esophagus but on later portions of the exam, no stricture is identified. Primary peristaltic waves in the esophagus were intact on 3/4 swallows. There was a notable degree of proximal escape. There is a widely patent distal esophageal mucosal ring, 1.8 cm in diameter, along with a small type 1 hiatal hernia. No reflux was directly served  during today's exam. No stricture noted. A 13 mm barium tablet passed briskly into the stomach. IMPRESSION: 1. Frank tracheal aspiration of thick barium during the pharyngeal phase of swallowing, causing cough response. Consider speech pathology consultation. 2. Small type 1 hiatal hernia. 3. Widely patent distal esophageal mucosal ring, 1.8 cm in diameter. Electronically Signed   By: Van Clines M.D.   On: 10/31/2019 15:41    Microbiology: Recent Results (from the past 240 hour(s))  Respiratory Panel by RT PCR (Flu A&B, Covid) - Nasopharyngeal Swab     Status: None   Collection Time: 10/26/19 10:14 AM   Specimen: Nasopharyngeal Swab  Result Value Ref Range Status   SARS Coronavirus 2 by RT PCR NEGATIVE NEGATIVE Final    Comment: (NOTE) SARS-CoV-2 target  nucleic acids are NOT DETECTED. The SARS-CoV-2 RNA is generally detectable in upper respiratoy specimens during the acute phase of infection. The lowest concentration of SARS-CoV-2 viral copies this assay can detect is 131 copies/mL. A negative result does not preclude SARS-Cov-2 infection and should not be used as the sole basis for treatment or other patient management decisions. A negative result may occur with  improper specimen collection/handling, submission of specimen other than nasopharyngeal swab, presence of viral mutation(s) within the areas targeted by this assay, and inadequate number of viral copies (<131 copies/mL). A negative result must be combined with clinical observations, patient history, and epidemiological information. The expected result is Negative. Fact Sheet for Patients:  PinkCheek.be Fact Sheet for Healthcare Providers:  GravelBags.it This test is not yet ap proved or cleared by the [place] FDA and  has been authorized for detection and/or diagnosis of SARS-CoV-2 by FDA under an Emergency Use Authorization (EUA). This EUA will remain  in  effect (meaning this test can be used) for the duration of the COVID-19 declaration under Section 564(b)(1) of the Act, 21 U.S.C. section 360bbb-3(b)(1), unless the authorization is terminated or revoked sooner.    Influenza A by PCR NEGATIVE NEGATIVE Final   Influenza B by PCR NEGATIVE NEGATIVE Final    Comment: (NOTE) The Xpert Xpress SARS-CoV-2/FLU/RSV assay is intended as an aid in  the diagnosis of influenza from Nasopharyngeal swab specimens and  should not be used as a sole basis for treatment. Nasal washings and  aspirates are unacceptable for Xpert Xpress SARS-CoV-2/FLU/RSV  testing. Fact Sheet for Patients: PinkCheek.be Fact Sheet for Healthcare Providers: GravelBags.it This test is not yet approved or cleared by the [place] FDA and  has been authorized for detection and/or diagnosis of SARS-CoV-2 by  FDA under an Emergency Use Authorization (EUA). This EUA will remain  in effect (meaning this test can be used) for the duration of the  Covid-19 declaration under Section 564(b)(1) of the Act, 21  U.S.C. section 360bbb-3(b)(1), unless the authorization is  terminated or revoked. Performed at [facility], [place] [address] [place], [place] [address]      Labs: Basic Metabolic Panel: Recent Labs  Lab 10/28/19 0550 10/29/19 0521 10/30/19 0543 10/31/19 0537 11/01/19 0541  NA 136 134* 135 135 135  K 4.2 4.0 4.4 4.2 4.1  CL 105 101 102 104 106  CO2 23 23 22 23 23   GLUCOSE 165* 175* 229* 149* 156*  BUN 28* 31* 31* 29* 26*  CREATININE 0.78 0.83 0.82 0.78 0.74  CALCIUM 8.6* 8.3* 8.4* 7.9* 8.0*  MG 2.2 2.3 2.3 2.2  --   PHOS 3.4 3.5 4.1 4.1  --    Liver Function Tests: Recent Labs  Lab 10/28/19 0550 10/29/19 0521 10/30/19 0543 10/31/19 0537  AST 13* 12* 13* 11*  ALT 15 13 13 12   ALKPHOS 82 76 75 73  BILITOT 1.0 0.4 0.7 0.8  PROT 6.1* 5.9* 5.8* 5.5*  ALBUMIN 3.6 3.6 3.6 3.3*    No results for input(s): LIPASE, AMYLASE in the last 168 hours. No results for input(s): AMMONIA in the last 168 hours. CBC: Recent Labs  Lab 10/26/19 1019 10/26/19 1019 10/27/19 0546 10/28/19 0550 10/29/19 0521 10/30/19 0543 11/01/19 0541  WBC 5.8   < > 6.4 8.4 9.4 9.4 8.6  NEUTROABS 4.1  --   --  7.6 8.6* 8.5*  --   HGB 13.5   < > 14.4 14.2 13.3 14.1 14.1  HCT 38.2*   < >  40.6 40.1 38.4* 40.3 39.6  MCV 92.9   < > 92.3 92.6 91.9 92.0 90.8  PLT 233   < > 268 241 270 257 242   < > = values in this interval not displayed.   Cardiac Enzymes: No results for input(s): CKTOTAL, CKMB, CKMBINDEX, TROPONINI in the last 168 hours. BNP: BNP (last 3 results) Recent Labs    06/28/19 1030 06/30/19 0619  BNP 46.6 48.7    ProBNP (last 3 results) No results for input(s): PROBNP in the last 8760 hours.  CBG: Recent Labs  Lab 10/31/19 2158 11/01/19 0126 11/01/19 0341 11/01/19 0745 11/01/19 1159  GLUCAP 114* 138* 151* 141* 151*       Signed:  Domenic Polite MD.  [facility] 11/01/2019, 2:59 PM

## 2019-11-01 NOTE — Progress Notes (Signed)
11/01/2019  Modified barium swallow outpatient study ordered at the recommendation of speech therapy while patient is inpatient.  This is a repeat from the inpatient study that was completed.  Wyn Quaker, FNP

## 2019-11-02 ENCOUNTER — Other Ambulatory Visit (HOSPITAL_COMMUNITY): Payer: Self-pay

## 2019-11-02 ENCOUNTER — Other Ambulatory Visit: Payer: Self-pay | Admitting: *Deleted

## 2019-11-02 DIAGNOSIS — R1310 Dysphagia, unspecified: Secondary | ICD-10-CM

## 2019-11-02 NOTE — Patient Outreach (Signed)
James [name] [facility]) Care Management  11/02/2019  James [name] [birth year] [id number]   Transition of care telephone call  Referral received:10/29/19 Initial outreach:11/02/19 Insurance: [name]   Initial unsuccessful telephone call to patient's preferred number in order to complete transition of care assessment; no answer, left HIPAA compliant voicemail message requesting return call.   Objective: Per the electronic medical record, James [name]  was hospitalized at [facility] from 4/9-4/15/21 for Asthma Exacerbation  . Comorbidities include: Asthmatic Bronchitis, GERD, CAD, Type 1 Diabetes with insulin pump.  He was discharged to home on 4/15 /21 without the need for home health services or durable medical equipment per the discharge summary.   Plan: This RNCM will route unsuccessful outreach letter with [facility] pamphlet and 24 hour Nurse Advice Line Magnet to [place] Management clinical pool to be mailed to patient's home address. This RNCM will attempt another outreach within 4 business days.   Joylene Draft, RN, BSN  [place] Management Coordinator  [phone]- Mobile [phone]- Toll Free Main Office

## 2019-11-07 ENCOUNTER — Other Ambulatory Visit: Payer: Self-pay | Admitting: *Deleted

## 2019-11-07 ENCOUNTER — Encounter: Payer: Self-pay | Admitting: *Deleted

## 2019-11-07 NOTE — Patient Outreach (Signed)
[facility]) Care Management  11/07/2019  James [name] 1955/07/25 [id number]   Transition of care call/case closure   Referral received:10/29/19 Initial outreach:11/02/19 Insurance: [place] [employer]    Subjective: 2nd attempt with  successful telephone call to patient's preferred number in order to complete transition of care assessment; 2 HIPAA identifiers verified. Explained purpose of call and completed transition of care assessment.  He states that he is doing much better not 100% yet. He denies increase in shortness of breath, wheezing, he reports no increase in coughing to has not required prn medication, hycodan ans states tessalon does not help. He continues on prednisone taper.  Patient speaking in complete sentence during phone call. He reports , tolerating diet, he discussed upcoming swallowing study test on this week prior to visit with pulmonary. He discussed having issue since having cervical surgery a few years ago with aspiration  states he understands that it contributes to his asthma episodes.  He continues nonsmoking. He voiced understanding of upcoming swallowing study. He reports tolerating current diet no reported increase in cough after swallowing. Patient discussed interest in exercise program, pulmonary rehab and will discuss with Wyn Quaker at visit this week, declined RN making contact.  Spouse are assisting with his recovery.  He discussed  any ongoing health issues of Diabetes which is under good control and he is enrolled in one of the [place] chronic disease management programs.  He does use  a [facility] outpatient pharmacy at [place] staying safe during pandemic .    Objective:  James [name]  was hospitalized at [facility] from 4/9-4/15/21 for Asth.ma Exacerbation  . Comorbidities include: Asthmatic Bronchitis, GERD, CAD, Type 1 Diabetes with insulin pump, A1c 6.55 on 10/26/19  He was discharged to home on 4/15 /21 without the need for  home health services or durable medical equipment per the discharge summary.   Assessment:  Patient voices good understanding of all discharge instructions.  See transition of care flowsheet for assessment details.   Plan:  Reviewed hospital discharge diagnosis of Asthma Exacerbation   and discharge treatment plan using hospital discharge instructions, assessing medication adherence, reviewing problems requiring provider notification, and discussing the importance of follow up with primary care provider and/or specialists as directed.Reinforced swallowing precautions of small sips bites/eating upright and staying seated at least 30 min. Using [facility] website, verified that patient is an active participate in [facility] [employer] chronic disease management program.    No ongoing care management needs identified so will close case to [place] Management services and patient has been routed [facility] outreach letter on initial outreach attempt on 11/02/19   Joylene Draft, RN, BSN  [place] Management Coordinator  [phone]- Mobile [phone]- [place]

## 2019-11-08 ENCOUNTER — Other Ambulatory Visit: Payer: Self-pay

## 2019-11-08 ENCOUNTER — Ambulatory Visit (HOSPITAL_COMMUNITY)
Admission: RE | Admit: 2019-11-08 | Discharge: 2019-11-08 | Disposition: A | Discharge status: Home / Self Care | Payer: 59 | Source: Ambulatory Visit | Attending: Pulmonary Disease | Admitting: Pulmonary Disease

## 2019-11-08 ENCOUNTER — Ambulatory Visit: Payer: 59 | Admitting: Adult Health

## 2019-11-08 DIAGNOSIS — R1310 Dysphagia, unspecified: Secondary | ICD-10-CM

## 2019-11-09 ENCOUNTER — Encounter: Payer: Self-pay | Admitting: Pulmonary Disease

## 2019-11-09 ENCOUNTER — Ambulatory Visit: Payer: 59 | Admitting: Pulmonary Disease

## 2019-11-09 ENCOUNTER — Telehealth: Payer: Self-pay | Admitting: Pulmonary Disease

## 2019-11-09 VITALS — BP 130/66 | HR 91 | Temp 97.1°F | Ht 69.0 in | Wt 181.2 lb

## 2019-11-09 DIAGNOSIS — J690 Pneumonitis due to inhalation of food and vomit: Secondary | ICD-10-CM | POA: Diagnosis not present

## 2019-11-09 DIAGNOSIS — J302 Other seasonal allergic rhinitis: Secondary | ICD-10-CM | POA: Diagnosis not present

## 2019-11-09 DIAGNOSIS — Z79899 Other long term (current) drug therapy: Secondary | ICD-10-CM

## 2019-11-09 DIAGNOSIS — R05 Cough: Secondary | ICD-10-CM

## 2019-11-09 DIAGNOSIS — R9389 Abnormal findings on diagnostic imaging of other specified body structures: Secondary | ICD-10-CM | POA: Diagnosis not present

## 2019-11-09 DIAGNOSIS — J449 Chronic obstructive pulmonary disease, unspecified: Secondary | ICD-10-CM

## 2019-11-09 DIAGNOSIS — R059 Cough, unspecified: Secondary | ICD-10-CM

## 2019-11-09 NOTE — Assessment & Plan Note (Signed)
Plan: We will request pharmacy team to investigate ICS/LABA inhaler cost and options for patient, would favor patient being off of DPI

## 2019-11-09 NOTE — Assessment & Plan Note (Signed)
Plan:  Continue Allegra Finish prednisone taper

## 2019-11-09 NOTE — Assessment & Plan Note (Signed)
Plan: We will cancel scheduled plain CT chest without contrast We will order high-resolution CT chest to be completed in June/2021 for further evaluation of suspected bronchiectasis, potential scarring from recurrent aspiration pneumonia, nodule follow-up

## 2019-11-09 NOTE — Assessment & Plan Note (Signed)
Plan: Continue Advair We will have clinical pharmacy team investigate inhaler options for patient June/2021 high-resolution CT chest 4-week follow-up with our office

## 2019-11-09 NOTE — Assessment & Plan Note (Signed)
Suspect patient has aspect of bronchiectasis  Plan: Resume flutter valve May need to consider different inhalers, or transition from DPI

## 2019-11-09 NOTE — Patient Instructions (Addendum)
You were seen today by Lauraine Rinne, NP  for:   1. Aspiration pneumonia of right lower lobe due to regurgitated food ([place])  I will follow up with speech therapy regarding outpatient options for support and therapy teaching for you  Continue maneuvers as instructed by speech therapist after outpatient exam  You are at high risk of recurrent pneumonias please contact our office if you are noticing acute worsening symptoms  I suspect you may have something called bronchiectasis:  Bronchiectasis: This is the medical term which indicates that you have damage, dilated airways making you more susceptible to respiratory infection. Use a flutter valve 10 breaths twice a [place] or 4 to 5 breaths 4-5 times a [place] to help clear mucus out Let us know if you have cough with change in mucus color or fevers or chills.  At that point you would need an antibiotic. We also may need to get a sputum sample.  Maintain a healthy nutritious diet, eating whole foods Take your medications as prescribed    2. Seasonal allergic rhinitis, unspecified trigger  Continue Allegra  3. Asthma   Continue Advair at this time  I will have our pharmacy team investigate if there is any other cost effective options for maintenance medications in place of Advair  4. Abnormal CT of the chest  CT chest without contrast was ordered in June, I will change this to a high resolution CT chest given her suspected bronchiectasis as well as recurrent asthma exacerbations, also will help further evaluate if there is any scarring given the recurrent pneumonias   Follow Up:    Return in about 27 days (around 12/06/2019), or if symptoms worsen or fail to improve, for Follow up with Dr. Elsworth Soho.   Please do your part to reduce the spread of COVID-19:      Reduce your risk of any infection  and COVID19 by using the similar precautions used for avoiding the common cold or flu:  [name] Wash your hands often with soap and warm water for at  least 20 seconds.  If soap and water are not readily available, use an alcohol-based hand sanitizer with at least 60% alcohol.  . If coughing or sneezing, cover your mouth and nose by coughing or sneezing into the elbow areas of your shirt or coat, into a tissue or into your sleeve (not your hands). Langley Gauss A MASK when in public  . Avoid shaking hands with others and consider head nods or verbal greetings only. . Avoid touching your eyes, nose, or mouth with unwashed hands.  . Avoid close contact with people who are sick. . Avoid places or events with large numbers of people in one location, like concerts or sporting events. . If you have some symptoms but not all symptoms, continue to monitor at home and seek medical attention if your symptoms worsen. . If you are having a medical emergency, call 911.   [place] / e-Visit: [email or web address]         [facility]: [facility]: [id number]                   [facility]: [id number]     It is flu season:   >>> Best ways to protect herself from the flu: Receive the yearly flu vaccine, practice good hand hygiene washing with soap and also using hand sanitizer when available, eat a nutritious meals,  get adequate rest, hydrate appropriately   Please contact the office if your symptoms worsen or you have concerns that you are not improving.   Thank you for choosing [facility] for your healthcare, and for allowing [place] to partner with you on your healthcare journey. I am thankful to be able to provide care to you today.   Wyn Quaker FNP-C

## 2019-11-09 NOTE — Assessment & Plan Note (Signed)
Plan: Continue swallow study recommendations We will change ordered CT in June/2021 high-resolution CT chest Notify our office if you have acute or worsening symptoms We will investigate what outpatient resources are available to help you with your swallowing Resume flutter valve use

## 2019-11-09 NOTE — Progress Notes (Signed)
@Patient  ID: James [name], male    DOB: 24-Oct-1955, 64 y.o.   MRN: [id number]  Chief Complaint  Patient presents with   Follow-up    COPD, Asthma.  Hospital f/u.  Dry cough, aspirating.  sob with exertion and coughing.    Referring provider: [name], MD  HPI:  64 year old male former smoker followed in our office for asthma bronchiectasis and recurrent aspiration pneumonia  PMH: Type 1 diabetes, hyperlipidemia, CAD, anxiety, depression, gastroparesis Smoker/ Smoking History: Former smoker Maintenance: Advair 100 Pt of: Dr. Elsworth Soho  11/09/2019  - Visit   64 year old male former smoker followed in our office for asthma, recurrent aspiration pneumonia, and suspected bronchiectasis.  Patient presenting today as a hospital follow-up.  Patient was hospitalized on 10/26/2019 and discharged on 11/01/2019 excerpt from discharge summaries listed below:  History of present illness:  64 year old male with history of tobacco abuse, asthma, type 1 diabetes mellitus, dyslipidemia, CAD admitted on 4/9 with asthma exacerbation, had very slow, poor clinical response  Hospital Course:   Severe asthma exacerbation Acute hypoxic respiratory failure Dysphagia, aspiration -Influenza PCR and Covid are negative, chest x-ray with clear lungs -CT angiogram was negative for PE -Slow clinical response to steroids, antibiotics, nebs , subsequently seen by pulmonary in consultation, esophagram ordered which showed concern for pharyngeal dysphagia with aspiration of barium, patient has a history of chronic silent aspiration based on modified barium swallow in 2018 following ACDF  -Finally had slow clinical response with numerous cough suppressants, PPI in addition to above therapy  -SLP evaluation was ordered this morning, MBS unfortunately could not be done until tomorrow and patient is extremely anxious to go home today hence this is being scheduled as outpatient  -We will need to continue aspiration  precautions -Discharged on prednisone slow taper along with Tessalon Perles, Mucinex, Hycodan cough syrup -Close pulmonary follow-up arranged for next week on 4/23, hopefully will have modified barium swallow completed in the interim  Type 1 diabetes mellitus -Continued insulin pump in-house  CAD -Continue aspirin and simvastatin  Tobacco use disorder -Counseled, continue-Continue Varenicline 1 mg po BID along with Nicotine Polacrilex 2 mg po  Anxiety -Stable, monitor, no longer taking hydroxyzine   Patient has had repeat swallow study since outpatient swallow study results listed below:   Pt presents with a pharyngeal dysphagia due to incomplete epiglottic inversion over the laryngeal vestibule as well as impaired timing of laryngeal vestibule closure (LVC).  There was intermittent, trace aspiration before the swallow, generally sensed by the pt and eliciting a cough response.  Presence of ADCF hardware may contribute to impaired epiglottic closure, but timing issues are more difficult to explain.  Pt attempted multiple strategies to improve airway protection, including head turns, tilts, and chin tuck in an effort to manipulate swallow physiology - none proved effective.  A mendelsohn maneuver - in which pt elevates larynx and maintains it in that position to lengthen duration of LVC - led to decreased quantity of aspiration.  Results were discussed with pt in real time, and fluoroscopy provided feedback so that pt could modify strategies in real time.  He agreed that OP SLP may provide some benefit in trialing other strategies (to work on timing, increasing duration of LVC).  Coughing/throat-clearing appears to be exacerbating an already irritated larynx.  Recommend continuing current diet; avoid mixed consistencies (cereals, soups with solids and broths); attempt mendelsohn maneuver during initial ten swallows of liquid with each meal; attempt brief oral hold prior to swallow.  Rec  OP  SLP.  Pt agrees with plan.   SLP Visit Diagnosis Dysphagia, pharyngeal phase (R13.13)    Patient feels that he would benefit from outpatient speech, swallow management.  He feels that the swallowing patterns that he was instructed by SLP while inpatient as well as during the outpatient study has been helpful.  He feels that he would benefit from more additional resources.  We will see how we can coordinate for the patient today.  Patient does have a flutter valve at home but he has not been using it.  Patient is due to get a repeat CT chest as a nodule follow-up in June.  It was initially felt back in February/2021 Dr. Elsworth Soho saw the patient last that given his asthmatic bronchitis as well as recurrent chronic cough.  There is a chance patient can have bronchiectasis.  We will discuss this today.  Patient remains on Advair 100.  Patient has previously taken Symbicort as well as Dulera in the past.  He has not noticed a difference with his cough with any of them.  He remains on Advair due to him finding this the most affordable.  He is open to all inhaler options.   Questionaires / Pulmonary Flowsheets:   MMRC: mMRC Dyspnea Scale mMRC Score  11/09/2019 3    Tests:   CT chest 4/12>>>Stable changes of central bronchial thickening likely related to bronchitis.Scattered ground-glass opacities some of which are new from the prior CT examination. Some interval resolution of previously seen opacities is noted consistent with the waxing and waning inflammatory process.No evidence of pulmonary emboli. Changes of prior granulomatous disease.   FENO:  Lab Results  Component Value Date   NITRICOXIDE 11 01/26/2017    PFT: PFT Results Latest Ref Rng & Units 09/18/2018 05/22/2015  FVC-Pre L 3.17 3.41  FVC-Predicted Pre % 70 74  FVC-Post L 3.35 3.28  FVC-Predicted Post % 74 71  Pre FEV1/FVC % % 75 74  Post FEV1/FCV % % 76 77  FEV1-Pre L 2.39 2.52  FEV1-Predicted Pre % 70 72  FEV1-Post L 2.56 2.53    DLCO UNC% % - 73  DLCO COR %Predicted % - 97  TLC L - 5.50  TLC % Predicted % - 80  RV % Predicted % - 113    WALK:  No flowsheet data found.  Imaging: DG Chest 2 View  Result Date: 10/26/2019 CLINICAL DATA:  Shortness of breath. EXAM: CHEST - 2 VIEW COMPARISON:  June 28, 2019 FINDINGS: The heart size and mediastinal contours are within normal limits. Both lungs are clear. The visualized skeletal structures are unremarkable. IMPRESSION: No active cardiopulmonary disease. Electronically Signed   By: Constance Holster M.D.   On: 10/26/2019 03:53   CT ANGIO CHEST PE W OR WO CONTRAST  Result Date: 10/29/2019 CLINICAL DATA:  Chronic dyspnea EXAM: CT ANGIOGRAPHY CHEST WITH CONTRAST TECHNIQUE: Multidetector CT imaging of the chest was performed using the standard protocol during bolus administration of intravenous contrast. Multiplanar CT image reconstructions and MIPs were obtained to evaluate the vascular anatomy. CONTRAST:  192mL OMNIPAQUE IOHEXOL 350 MG/ML SOLN COMPARISON:  Chest x-ray from earlier in the same [place], CT from 07/03/2019. FINDINGS: Cardiovascular: Thoracic aorta and its branches demonstrate mild atherosclerotic calcifications. No aneurysmal dilatation or dissection is seen. No cardiac enlargement is seen. Coronary calcifications are noted. The pulmonary artery is slightly limited in evaluation due to the timing of the contrast bolus although no definitive central pulmonary embolus is seen. Mediastinum/Nodes: Thoracic inlet is  within normal limits. No hilar or mediastinal adenopathy is noted. The esophagus is within normal limits. Lungs/Pleura: The lungs are well aerated bilaterally. Scattered scarring is noted in the bases bilaterally. There are patchy ground-glass opacities likely postinflammatory in nature within the left upper lobe. The previously seen ground-glass opacity has resolved in new changes are seen. Calcified granuloma is noted in the left lower lobe. Some mild  bronchial wall thickening is noted with some areas of mucous plugging. This is stable from the prior exam. Upper Abdomen: Changes of prior granulomatous disease are noted in the spleen. Small nonobstructing left renal calculi are noted. Musculoskeletal: Degenerative changes of the thoracic spine are seen. No acute rib abnormality is noted. Old rib fractures are seen on the left. Review of the MIP images confirms the above findings. IMPRESSION: Stable changes of central bronchial thickening likely related to bronchitis. Scattered ground-glass opacities some of which are new from the prior CT examination. Some interval resolution of previously seen opacities is noted consistent with the waxing and waning inflammatory process. No evidence of pulmonary emboli. Changes of prior granulomatous disease. Aortic Atherosclerosis (ICD10-I70.0). Electronically Signed   By: Inez Catalina M.D.   On: 10/29/2019 15:08   DG CHEST PORT 1 VIEW  Result Date: 10/30/2019 CLINICAL DATA:  Shortness of breath EXAM: PORTABLE CHEST 1 VIEW COMPARISON:  Yesterday FINDINGS: Mild streaky density at the lung bases attributed to atelectasis. There is prominent bronchitis by chest CT yesterday no edema, effusion, or pneumothorax. Normal heart size. IMPRESSION: Mild increase in lower lobe atelectasis. Electronically Signed   By: Monte Fantasia M.D.   On: 10/30/2019 07:41   DG CHEST PORT 1 VIEW  Result Date: 10/29/2019 CLINICAL DATA:  64 year old male with history of shortness of breath. EXAM: PORTABLE CHEST 1 VIEW COMPARISON:  Chest x-ray 10/28/2019. FINDINGS: Lung volumes are normal. No consolidative airspace disease. No pleural effusions. No pneumothorax. No pulmonary nodule or mass noted. Pulmonary vasculature and the cardiomediastinal silhouette are within normal limits. IMPRESSION: No radiographic evidence of acute cardiopulmonary disease. Electronically Signed   By: Vinnie Langton M.D.   On: 10/29/2019 07:55   DG CHEST PORT 1  VIEW  Result Date: 10/28/2019 CLINICAL DATA:  Short of breath EXAM: PORTABLE CHEST 1 VIEW COMPARISON:  None. FINDINGS: Normal mediastinum and cardiac silhouette. Chronic central bronchitic markings. Normal pulmonary vasculature. No effusion, infiltrate, or pneumothorax. Anterior cervical fusion IMPRESSION: Chronic bronchitic markings.  No acute findings. Electronically Signed   By: Suzy Bouchard M.D.   On: 10/28/2019 06:51   DG Chest Port 1 View  Result Date: 10/26/2019 CLINICAL DATA:  Cough and shortness of breath over the last week. EXAM: PORTABLE CHEST 1 VIEW COMPARISON:  Earlier same [facility] FINDINGS: The heart size and mediastinal contours are within normal limits. Both lungs are clear. The visualized skeletal structures are unremarkable. IMPRESSION: No active disease. Electronically Signed   By: Nelson Chimes M.D.   On: 10/26/2019 10:41   DG SWALLOW [facility] SPEECH PATH  Result Date: 11/08/2019 Objective Swallowing Evaluation: Type of Study: MBS-Modified Barium Swallow Study  Patient Details Name: James [name] MRN: [id number] Date of Birth: [birth year] Today's Date: 11/08/2019 Time: SLP Start Time (ACUTE ONLY): 1100 -SLP Stop Time (ACUTE ONLY): 1135 SLP Time Calculation (min) (ACUTE ONLY): 35 min Past Medical History: Past Medical History: Diagnosis Date  ANXIETY 04/03/2007  Anxiety   ASTHMA 09/06/2008  Asthma   ASTHMATIC BRONCHITIS, ACUTE 10/25/2008  Bladder neck obstruction   CARPAL TUNNEL SYNDROME, BILATERAL 07/31/2007  issues  resolved, no surgery  Cervical disc disease   Chronic bronchitis ([place])   "get it about q yr" (02/12/2014)  COPD (chronic obstructive pulmonary disease) ([place])   CORONARY ARTERY DISEASE 04/03/2007  DEPRESSION 04/03/2007  Depression   DIABETES MELLITUS, TYPE I 04/03/2007  Diabetic retinopathy associated with diabetes mellitus due to underlying condition ([place]) 04/03/2007  DM W/EYE MANIFESTATIONS, TYPE I, UNCONTROLLED 04/04/2007  DM W/RENAL MNFST, TYPE I, UNCONTROLLED 04/04/2007  ED  (erectile dysfunction)   History of kidney stones   HYPERLIPIDEMIA 04/04/2007  Pneumonia   "several times and again today" (02/13/2104)  Renal insufficiency   Seizures ([place])   "insulin seizure from time to time; none in the last couple years" (02/12/2014)  Spinal stenosis  Past Surgical History: Past Surgical History: Procedure Laterality Date  ANTERIOR CERVICAL DECOMP/DISCECTOMY FUSION  2000  "couple screws and a plate"  ANTERIOR CERVICAL DECOMP/DISCECTOMY FUSION N/A 06/24/2016  Procedure: ANTERIOR CERVICAL DECOMPRESSION/DISCECTOMY FUSION CERVICAL FOUR - CERVICAL FIVE, CERVICAL FIVE - CERVICAL SIX; REMOVAL TETHER CERVICAL PLATE;  Surgeon: Jovita Gamma, MD;  Location: [facility];  Service: Neurosurgery;  Laterality: N/A;  ANTERIOR CERVICAL DECOMPRESSION/DISCECTOMY FUSION CERVICAL FOUR - CERVICAL FIVE, CERVICAL FIVE - CERVICAL SIX; REMOVAL TETHER CERVICAL PLATE  APPENDECTOMY    BACK SURGERY    CARDIAC CATHETERIZATION  1990's  CATARACT EXTRACTION W/ INTRAOCULAR LENS  IMPLANT, BILATERAL Bilateral   CYSTOSCOPY WITH RETROGRADE PYELOGRAM, URETEROSCOPY AND STENT PLACEMENT Bilateral 04/06/2013  Procedure: BILATERAL CYSTOSCOPY WITH RETROGRADE PYELOGRAMS, STENT PLACEMENTS AND LEFT URETEROSCOPY AND STONE REMOVAL;  Surgeon: Alexis Frock, MD;  Location: [facility];  Service: Urology;  Laterality: Bilateral;  CYSTOSCOPY WITH STENT PLACEMENT Right 04/12/2013  Procedure: CYSTOSCOPY WITH STENT PLACEMENT;  Surgeon: Alexis Frock, MD;  Location: [facility];  Service: Urology;  Laterality: Right;  CYSTOSCOPY/RETROGRADE/URETEROSCOPY Bilateral 04/12/2013  Procedure: CYSTOSCOPY/RETROGRADE/URETEROSCOPY;  Surgeon: Alexis Frock, MD;  Location: [facility];  Service: Urology;  Laterality: Bilateral;  RIGHT RETROGRADE   HOLMIUM LASER APPLICATION Left [id number]  Procedure: HOLMIUM LASER APPLICATION;  Surgeon: Alexis Frock, MD;  Location: [facility];  Service: Urology;  Laterality: Left;  LUMBAR LAMINECTOMY/DECOMPRESSION MICRODISCECTOMY N/A  04/20/2019  Procedure: Lumbar microdisectomy and decompression L5-S1 left;  Surgeon: Latanya Maudlin, MD;  Location: [facility];  Service: Orthopedics;  Laterality: N/A;  22min  LYMPH NODE DISSECTION  ~ 1960  groin  stress cardiolite  09/06/2002  TONSILLECTOMY    VITRECTOMY Bilateral  HPI: 83 y o male with hx of allergic rhinitis, GERD, and asthma referred for OP MBS.  Recent admission 10/26/19 with asthma exacerbation.  PCCM determined symptom resolution was likely delayed by reflux and associated cough causing worsening upper airway irritation.  Suspected element of LPR.  Esophagram 4/14 revealed frank tracheal aspiration of thick barium during the pharyngeal phase. Relevant hx also includes ACDF C4-6 2017. MBS 01/2017 revealed impaired timing of laryngeal closure with subsequent silent aspiration, impaired epiglottic inversion, vallecular residue.  Today, pt reports intermittent difficulty with swallowing over the years, but particularly around time of recent hospitalization.  He participated in swallowing therapy several years ago to assist with airway protection.   Subjective: alert, good historian Assessment / Plan / Recommendation CHL IP CLINICAL IMPRESSIONS 11/08/2019 Clinical Impression Pt presents with a pharyngeal dysphagia due to incomplete epiglottic inversion over the laryngeal vestibule as well as impaired timing of laryngeal vestibule closure (LVC).  There was intermittent, trace aspiration before the swallow, generally sensed by the pt and eliciting a cough response.  Presence of ADCF hardware may contribute to impaired epiglottic closure, but timing  issues are more difficult to explain.  Pt attempted multiple strategies to improve airway protection, including head turns, tilts, and chin tuck in an effort to manipulate swallow physiology - none proved effective.  A mendelsohn maneuver - in which pt elevates larynx and maintains it in that position to lengthen duration of LVC - led to decreased quantity of  aspiration.  Results were discussed with pt in real time, and fluoroscopy provided feedback so that pt could modify strategies in real time.  He agreed that OP SLP may provide some benefit in trialing other strategies (to work on timing, increasing duration of LVC).  Coughing/throat-clearing appears to be exacerbating an already irritated larynx.  Recommend continuing current diet; avoid mixed consistencies (cereals, soups with solids and broths); attempt mendelsohn maneuver during initial ten swallows of liquid with each meal; attempt brief oral hold prior to swallow.  Rec OP SLP.  Pt agrees with plan.  SLP Visit Diagnosis Dysphagia, pharyngeal phase (R13.13) Attention and concentration deficit following -- Frontal lobe and executive function deficit following -- Impact on safety and function Mild aspiration risk   CHL IP TREATMENT RECOMMENDATION 11/01/2019 Treatment Recommendations Defer until completion of intrumental exam   Prognosis 11/01/2019 Prognosis for Safe Diet Advancement Good Barriers to Reach Goals -- Barriers/Prognosis Comment -- CHL IP DIET RECOMMENDATION 11/08/2019 SLP Diet Recommendations Regular solids;Thin liquid Liquid Administration via Cup;Straw Medication Administration Whole meds with puree Compensations -- Postural Changes --   CHL IP OTHER RECOMMENDATIONS 11/08/2019 Recommended Consults -- Oral Care Recommendations Oral care BID Other Recommendations --   CHL IP FOLLOW UP RECOMMENDATIONS 11/08/2019 Follow up Recommendations Outpatient SLP   No flowsheet data found.     CHL IP ORAL PHASE 11/08/2019 Oral Phase WFL Oral - Pudding Teaspoon -- Oral - Pudding Cup -- Oral - Honey Teaspoon -- Oral - Honey Cup -- Oral - Nectar Teaspoon -- Oral - Nectar Cup -- Oral - Nectar Straw -- Oral - Thin Teaspoon -- Oral - Thin Cup -- Oral - Thin Straw -- Oral - Puree -- Oral - Mech Soft -- Oral - Regular -- Oral - Multi-Consistency -- Oral - Pill -- Oral Phase - Comment --  CHL IP PHARYNGEAL PHASE 11/08/2019  Pharyngeal Phase Impaired Pharyngeal- Pudding Teaspoon -- Pharyngeal -- Pharyngeal- Pudding Cup -- Pharyngeal -- Pharyngeal- Honey Teaspoon -- Pharyngeal -- Pharyngeal- Honey Cup -- Pharyngeal -- Pharyngeal- Nectar Teaspoon -- Pharyngeal -- Pharyngeal- Nectar Cup Delayed swallow initiation-vallecula;Reduced epiglottic inversion Pharyngeal -- Pharyngeal- Nectar Straw -- Pharyngeal -- Pharyngeal- Thin Teaspoon -- Pharyngeal -- Pharyngeal- Thin Cup Reduced epiglottic inversion;Reduced airway/laryngeal closure;Penetration/Aspiration before swallow;Trace aspiration Pharyngeal Material enters airway, passes BELOW cords and not ejected out despite cough attempt by patient Pharyngeal- Thin Straw Reduced epiglottic inversion;Reduced airway/laryngeal closure;Penetration/Aspiration before swallow;Trace aspiration Pharyngeal Material enters airway, passes BELOW cords and not ejected out despite cough attempt by patient Pharyngeal- Puree Delayed swallow initiation-vallecula;Reduced epiglottic inversion Pharyngeal -- Pharyngeal- Mechanical Soft -- Pharyngeal -- Pharyngeal- Regular -- Pharyngeal -- Pharyngeal- Multi-consistency -- Pharyngeal -- Pharyngeal- Pill -- Pharyngeal -- Pharyngeal Comment --  CHL IP CERVICAL ESOPHAGEAL PHASE 11/08/2019 Cervical Esophageal Phase WFL Pudding Teaspoon -- Pudding Cup -- Honey Teaspoon -- Honey Cup -- Nectar Teaspoon -- Nectar Cup -- Nectar Straw -- Thin Teaspoon -- Thin Cup -- Thin Straw -- Puree -- Mechanical Soft -- Regular -- Multi-consistency -- Pill -- Cervical Esophageal Comment -- Juan Quam Laurice 11/08/2019, 1:51 PM              DG ESOPHAGUS W  DOUBLE CM (HD)  Result Date: 10/31/2019 CLINICAL DATA:  Cough EXAM: ESOPHOGRAM / BARIUM SWALLOW / BARIUM TABLET STUDY TECHNIQUE: Combined double contrast and single contrast examination performed using effervescent crystals, thick barium liquid, and thin barium liquid. The patient was observed with fluoroscopy swallowing a 13 mm barium  sulphate tablet. FLUOROSCOPY TIME:  Fluoroscopy Time:  3 minutes 0 seconds Radiation Exposure Index (if provided by the fluoroscopic device): 2.2 mGy Number of Acquired Spot Images: 0 COMPARISON:  None. FINDINGS: There is frank tracheal aspiration of thick barium during the pharyngeal phase of swallowing. This elicited a cough response. Double contrast views were suboptimal due to some esophageal spasm particularly in the distal esophagus. This resembles narrowing in the distal esophagus but on later portions of the exam, no stricture is identified. Primary peristaltic waves in the esophagus were intact on 3/4 swallows. There was a notable degree of proximal escape. There is a widely patent distal esophageal mucosal ring, 1.8 cm in diameter, along with a small type 1 hiatal hernia. No reflux was directly served during today's exam. No stricture noted. A 13 mm barium tablet passed briskly into the stomach. IMPRESSION: 1. Frank tracheal aspiration of thick barium during the pharyngeal phase of swallowing, causing cough response. Consider speech pathology consultation. 2. Small type 1 hiatal hernia. 3. Widely patent distal esophageal mucosal ring, 1.8 cm in diameter. Electronically Signed   By: Van Clines M.D.   On: 10/31/2019 15:41    Lab Results:  CBC    Component Value Date/Time   WBC 8.6 11/01/2019 0541   RBC 4.36 11/01/2019 0541   HGB 14.1 11/01/2019 0541   HCT 39.6 11/01/2019 0541   PLT 242 11/01/2019 0541   MCV 90.8 11/01/2019 0541   MCH 32.3 11/01/2019 0541   MCHC 35.6 11/01/2019 0541   RDW 11.5 11/01/2019 0541   LYMPHSABS 0.7 10/30/2019 0543   MONOABS 0.2 10/30/2019 0543   EOSABS 0.0 10/30/2019 0543   BASOSABS 0.0 10/30/2019 0543    BMET    Component Value Date/Time   NA 135 11/01/2019 0541   K 4.1 11/01/2019 0541   CL 106 11/01/2019 0541   CO2 23 11/01/2019 0541   GLUCOSE 156 (H) 11/01/2019 0541   BUN 26 (H) 11/01/2019 0541   CREATININE 0.74 11/01/2019 0541   CALCIUM  8.0 (L) 11/01/2019 0541   GFRNONAA >60 11/01/2019 0541   GFRAA >60 11/01/2019 0541    BNP    Component Value Date/Time   BNP 48.7 06/30/2019 0619    ProBNP No results found for: PROBNP  Specialty Problems      Pulmonary Problems   Allergic rhinitis    Qualifier: Diagnosis of  By: Jenny Reichmann MD, Hunt Oris       Asthma, persistent controlled    Qualifier: Diagnosis of  By: Loanne Drilling MD, Sean A       Cough    Qualifier: Diagnosis of  By: Loanne Drilling MD, Sean A       Chronic asthmatic bronchitis ([place])    Spiro 2012:  FEV1 1.94 (52 %) but ratio normal at 84% >>suspect has airtrapping rather than restriction to explain low FVC.  - pft's 05/22/2015  No airflow obst/ nl dlco and no meds at all prior   Feel recurrent bronchitis related to volume loss in left lung and smoking        Aspiration pneumonia (HCC)   Asthma exacerbation   Acute asthma exacerbation      Allergies  Allergen Reactions  Augmentin [Amoxicillin-Pot Clavulanate] Nausea And Vomiting and Other (See Comments)    Did it involve swelling of the face/tongue/throat, SOB, or low BP? No Did it involve sudden or severe rash/hives, skin peeling, or any reaction on the inside of your mouth or nose? No Did you need to seek medical attention at a hospital or doctor's office? No When did it last happen?5+ years If all above answers are NO, may proceed with cephalosporin use.    Lexapro [Escitalopram Oxalate] Itching    Immunization History  Administered Date(s) Administered   Influenza Split 04/18/2017, 03/28/2019   Influenza Whole 07/31/2007, 04/15/2009, 05/08/2010   Influenza,inj,Quad PF,6+ Mos 05/19/2016   Influenza-Unspecified 03/19/2015   PFIZER SARS-COV-2 Vaccination 09/21/2019, 10/12/2019   Pneumococcal Polysaccharide-23 04/19/2003, 04/11/2019   Td 12/22/2000   Tdap 08/19/2013   Zoster Recombinat (Shingrix) 09/27/2019    Past Medical History:  Diagnosis Date   ANXIETY 04/03/2007    Anxiety    ASTHMA 09/06/2008   Asthma    ASTHMATIC BRONCHITIS, ACUTE 10/25/2008   Bladder neck obstruction    CARPAL TUNNEL SYNDROME, BILATERAL 07/31/2007   issues resolved, no surgery   Cervical disc disease    Chronic bronchitis ([place])    "get it about q yr" (02/12/2014)   COPD (chronic obstructive pulmonary disease) ([place])    CORONARY ARTERY DISEASE 04/03/2007   DEPRESSION 04/03/2007   Depression    DIABETES MELLITUS, TYPE I 04/03/2007   Diabetic retinopathy associated with diabetes mellitus due to underlying condition ([place]) 04/03/2007   DM W/EYE MANIFESTATIONS, TYPE I, UNCONTROLLED 04/04/2007   DM W/RENAL MNFST, TYPE I, UNCONTROLLED 04/04/2007   ED (erectile dysfunction)    History of kidney stones    HYPERLIPIDEMIA 04/04/2007   Pneumonia    "several times and again today" (02/13/2104)   Renal insufficiency    Seizures ([place])    "insulin seizure from time to time; none in the last couple years" (02/12/2014)   Spinal stenosis     Tobacco History: Social History   Tobacco Use  Smoking Status Former Smoker   Packs/[place]: 0.00   Types: Cigarettes   Quit date: 08/20/2019   Years since quitting: 0.2  Smokeless Tobacco Never Used  Tobacco Comment   Successfully quit smoking 08/20/2019   Counseling given: Not Answered Comment: Successfully quit smoking 08/20/2019   Continue to not smoke  Outpatient Encounter Medications as of 11/09/2019  Medication Sig   acetaminophen (TYLENOL) 500 MG tablet Take 1,000 mg by mouth every 6 (six) hours as needed for mild pain or headache.   albuterol (PROVENTIL) (2.5 MG/3ML) 0.083% nebulizer solution USE 1 VIAL VIA NEBULIZER EVERY 6 HOURS AS NEEDED FOR WHEEZING OR SHORTNESS OF BREATH (Patient taking differently: Take 2.5 mg by nebulization every 6 (six) hours as needed for wheezing or shortness of breath. )   albuterol (VENTOLIN HFA) 108 (90 Base) MCG/ACT inhaler INHALE 2 PUFFS BY MOUTH INTO THE LUNGS EVERY 6 HOURS AS NEEDED FOR  WHEEZING OR SHORTNESS OF BREATH. (Patient taking differently: Inhale 2 puffs into the lungs every 6 (six) hours as needed for wheezing or shortness of breath. )   benzonatate (TESSALON PERLES) 100 MG capsule Take 1 capsule (100 mg total) by mouth 3 (three) times daily for 10 days.   fexofenadine (ALLEGRA) 180 MG tablet Take 180 mg by mouth daily.   finasteride (PROSCAR) 5 MG tablet Take 5 mg by mouth daily.   Fluticasone-Salmeterol (ADVAIR DISKUS) 100-50 MCG/DOSE AEPB Inhale 1 puff into the lungs 2 (two) times daily.  guaiFENesin (MUCINEX) 600 MG 12 hr tablet Take 2 tablets (1,200 mg total) by mouth 2 (two) times daily.   HYDROcodone-homatropine (HYCODAN) 5-1.5 MG/5ML syrup Take 5 mLs by mouth every 6 (six) hours as needed for cough.   insulin lispro (HUMALOG) 100 UNIT/ML KwikPen Junior Inject 0.05 mLs (5 Units total) into the skin 3 (three) times daily before meals.   insulin lispro (HUMALOG) 100 UNIT/ML KwikPen Junior Sliding scale: CBG 70 - 120: 0 units  CBG 121 - 150: 2 units  CBG 151 - 200: 3 units  CBG 201 - 250: 5 units  CBG 251 - 300: 8 units  CBG 301 - 350: 11 units  CBG 351 - 400: 15 units   Insulin Pen Needle 31G X 5 MM MISC 1 Device by Does not apply route 4 (four) times daily. For use with insulin pens   Multiple Vitamins-Minerals (AIRBORNE PO) Take 3 tablets by mouth daily.   nicotine polacrilex (NICOTINE MINI) 2 MG lozenge Take 1 lozenge (2 mg total) by mouth as needed for smoking cessation.   pantoprazole (PROTONIX) 40 MG tablet Take 1 tablet (40 mg total) by mouth 2 (two) times daily. Take Protonix twice a [place] while on prednisone and then back down to daily   polyvinyl alcohol (LIQUIFILM TEARS) 1.4 % ophthalmic solution Place 1 drop into both eyes as needed for dry eyes.   predniSONE (DELTASONE) 10 MG tablet Take 4 tabs po daily x 3 days; then 3 tabs daily x3 days; then 2 tabs daily x3 days; then 1 tab daily x 3 days; then stop   simvastatin (ZOCOR) 40 MG tablet  Take 40 mg by mouth at bedtime.    traZODone (DESYREL) 150 MG tablet Take 150 mg by mouth at bedtime.   varenicline (CHANTIX CONTINUING MONTH PAK) 1 MG tablet Take 1 tablet (1 mg total) by mouth 2 (two) times daily.   No facility-administered encounter medications on file as of 11/09/2019.     Review of Systems  Review of Systems  Constitutional: Positive for fatigue (Improving). Negative for activity change, chills, fever and unexpected weight change.  HENT: Positive for trouble swallowing. Negative for postnasal drip, rhinorrhea, sinus pressure, sinus pain and sore throat.   Eyes: Negative.   Respiratory: Positive for cough. Negative for shortness of breath and wheezing.   Cardiovascular: Negative for chest pain and palpitations.  Gastrointestinal: Negative for constipation, diarrhea, nausea and vomiting.  Endocrine: Negative.   Genitourinary: Negative.   Musculoskeletal: Negative.   Skin: Negative.   Neurological: Negative for dizziness and headaches.  Psychiatric/Behavioral: Negative.  Negative for dysphoric mood. The patient is not nervous/anxious.   All other systems reviewed and are negative.    Physical Exam  BP 130/66 (BP Location: Left Arm, Cuff Size: Normal)    Pulse 91    Temp (!) 97.1 F (36.2 C) (Temporal)    Ht 5\' 9"  (1.753 m)    Wt 181 lb 3.2 oz (82.2 kg)    SpO2 100%    BMI 26.76 kg/m   Wt Readings from Last 5 Encounters:  11/09/19 181 lb 3.2 oz (82.2 kg)  07/17/19 173 lb 3.2 oz (78.6 kg)  07/08/19 174 lb 6.1 oz (79.1 kg)  04/20/19 175 lb 5 oz (79.5 kg)  04/17/19 175 lb 5 oz (79.5 kg)    BMI Readings from Last 5 Encounters:  11/09/19 26.76 kg/m  07/17/19 25.58 kg/m  07/08/19 25.75 kg/m  04/20/19 25.89 kg/m  04/17/19 25.89 kg/m  Physical Exam Vitals and nursing note reviewed.  Constitutional:      General: He is not in acute distress.    Appearance: Normal appearance. He is normal weight.  HENT:     Head: Normocephalic and atraumatic.      Right Ear: Hearing and external ear normal.     Left Ear: Hearing and external ear normal.     Nose: Nose normal. No mucosal edema or rhinorrhea.     Right Turbinates: Not enlarged.     Left Turbinates: Not enlarged.     Mouth/Throat:     Mouth: Mucous membranes are dry.     Pharynx: Oropharynx is clear. No oropharyngeal exudate.  Eyes:     Pupils: Pupils are equal, round, and reactive to light.  Cardiovascular:     Rate and Rhythm: Normal rate and regular rhythm.     Pulses: Normal pulses.     Heart sounds: Normal heart sounds. No murmur.  Pulmonary:     Effort: Pulmonary effort is normal.     Breath sounds: Rhonchi present. No decreased breath sounds, wheezing or rales.     Comments: Noisy chest Abdominal:     General: Bowel sounds are normal. There is no distension.     Palpations: Abdomen is soft.     Tenderness: There is no abdominal tenderness.  Musculoskeletal:     Cervical back: Normal range of motion.     Right lower leg: No edema.     Left lower leg: No edema.  Lymphadenopathy:     Cervical: No cervical adenopathy.  Skin:    General: Skin is warm and dry.     Capillary Refill: Capillary refill takes less than 2 seconds.     Findings: No erythema or rash.  Neurological:     General: No focal deficit present.     Mental Status: He is alert and oriented to person, place, and time.     Motor: No weakness.     Coordination: Coordination normal.     Gait: Gait is intact. Gait normal.  Psychiatric:        Mood and Affect: Mood normal.        Behavior: Behavior normal. Behavior is cooperative.        Thought Content: Thought content normal.        Judgment: Judgment normal.       Assessment & Plan:   Allergic rhinitis Plan:  Continue Allegra Finish prednisone taper   Aspiration pneumonia ([place]) Plan: Continue swallow study recommendations We will change ordered CT in June/2021 high-resolution CT chest Notify our office if you have acute or worsening  symptoms We will investigate what outpatient resources are available to help you with your swallowing Resume flutter valve use   Chronic asthmatic bronchitis ([place]) Plan: Continue Advair We will have clinical pharmacy team investigate inhaler options for patient June/2021 high-resolution CT chest 4-week follow-up with our office  Abnormal CT of the chest Plan: We will cancel scheduled plain CT chest without contrast We will order high-resolution CT chest to be completed in June/2021 for further evaluation of suspected bronchiectasis, potential scarring from recurrent aspiration pneumonia, nodule follow-up  Cough Suspect patient has aspect of bronchiectasis  Plan: Resume flutter valve May need to consider different inhalers, or transition from DPI  Medication management Plan: We will request pharmacy team to investigate ICS/LABA inhaler cost and options for patient, would favor patient being off of DPI    Return in about 27 days (around 12/06/2019), or  if symptoms worsen or fail to improve, for Follow up with Dr. Elsworth Soho.   Lauraine Rinne, NP 11/09/2019   This appointment required 42 minutes of patient care (this includes precharting, chart review, review of results, face-to-face care, etc.).

## 2019-11-09 NOTE — Telephone Encounter (Signed)
11/09/2019  Clinical pharmacy team,  Patient is currently maintained on Advair 100.  Would like to transition patient off of DPI.  Can we see what the cost would be for other ICS/LABA low-dose inhalers?  Wyn Quaker FNP

## 2019-11-12 ENCOUNTER — Other Ambulatory Visit: Payer: Self-pay | Admitting: Pulmonary Disease

## 2019-11-12 MED ORDER — MOMETASONE FURO-FORMOTEROL FUM 100-5 MCG/ACT IN AERO: 2.0000 | INHALATION_SPRAY | Freq: Two times a day (BID) | RESPIRATORY_TRACT | 3 refills | Status: DC

## 2019-11-12 MED FILL — SIMVASTATIN 40 MG TABLET: 40 | 30 days supply | Qty: 30 | Fill #1

## 2019-11-12 MED FILL — DULERA 100 MCG/5 MCG INH: 100-5 | 30 days supply | Qty: 13 | Fill #0

## 2019-11-12 NOTE — Telephone Encounter (Signed)
Personally I would prefer him on a HFA inhaler like advair hfa, dulera, or symbicort. Instead of the current Advair diskus. Ultimately it is the patients choice.   We just wanted to get the information for him.   Wyn Quaker FNP

## 2019-11-12 NOTE — Telephone Encounter (Signed)
Please send in dulera script:   Dulera  >>> 2 puffs in the morning right when you wake up, rinse out your mouth after use, 12 hours later 2 puffs, rinse after use >>> Take this daily, no matter what >>> This is not a rescue inhaler   3 refills.   Pt can access copay card online for dulera.   Wyn Quaker FNP

## 2019-11-12 NOTE — Telephone Encounter (Signed)
Pt called back- -said to go ahead with prescription at [facility]

## 2019-11-12 NOTE — Telephone Encounter (Signed)
Called spoke with patient and discussed Aaron Edelman NP's recommendations from the pharmacy team's findings.   Initially patient stated that he would like to try the [facility] 100 with a copay card.  However, once patient was informed that he would have to request the copay card from the website and we cannot do it for him anymore, patient became more reluctant.  Patient would like to know Brian's thoughts on the Advair vs Dulera and which one he thinks would be best for patient at this time.  Patient will, however go ahead and call his pharmacy to see if they can assist with the copay assistance card and tell him how much his copay will be and this will also aid in his decision making.

## 2019-11-12 NOTE — Telephone Encounter (Signed)
Called patient and advised per Brian's recommendations. Pt says he would prefer Dulera out of choices given Please send Dulera.

## 2019-11-12 NOTE — Telephone Encounter (Signed)
Dulera 100 >>> 2 puffs in the morning right when you wake up, rinse out your mouth after use, 12 hours later 2 puffs, rinse after use >>> Take this daily, no matter what >>> This is not a rescue inhaler

## 2019-11-12 NOTE — Telephone Encounter (Signed)
Rx for [facility] inhaler has been sent to [place] for pt. Attempted to call pt to let him know this was done but unable to reach. Left message for pt to call back.

## 2019-11-12 NOTE — Telephone Encounter (Signed)
All test claims for are 1 month supply:  Symbicort- $69.27 copay Dulera- $100.00 copay- copay card available Airduo- $100.00 copay- copay card available Advair HFA- $75.60

## 2019-11-12 NOTE — Telephone Encounter (Signed)
James [name],   Can we contact the patient and let him know we could consider transitioning from Advair 100 to [facility] 100 or Symbicort 80. Dulera pt could request copay card since he has [employer].   If patient wants to wait till next appt with Dr. Elsworth Soho to discuss that is okay as well.   Wyn Quaker FNP

## 2019-11-19 DIAGNOSIS — E1065 Type 1 diabetes mellitus with hyperglycemia: Secondary | ICD-10-CM | POA: Diagnosis not present

## 2019-11-21 DIAGNOSIS — E1065 Type 1 diabetes mellitus with hyperglycemia: Secondary | ICD-10-CM | POA: Diagnosis not present

## 2019-11-21 MED FILL — CHANTIX 1 MG CONT MONTH BOX: 1 | 28 days supply | Qty: 56 | Fill #1

## 2019-11-22 DIAGNOSIS — M5136 Other intervertebral disc degeneration, lumbar region: Secondary | ICD-10-CM | POA: Diagnosis not present

## 2019-11-22 DIAGNOSIS — R1313 Dysphagia, pharyngeal phase: Secondary | ICD-10-CM | POA: Diagnosis not present

## 2019-11-22 DIAGNOSIS — N401 Enlarged prostate with lower urinary tract symptoms: Secondary | ICD-10-CM | POA: Diagnosis not present

## 2019-11-22 DIAGNOSIS — J45901 Unspecified asthma with (acute) exacerbation: Secondary | ICD-10-CM | POA: Diagnosis not present

## 2019-11-22 DIAGNOSIS — F17200 Nicotine dependence, unspecified, uncomplicated: Secondary | ICD-10-CM | POA: Diagnosis not present

## 2019-11-22 DIAGNOSIS — Z794 Long term (current) use of insulin: Secondary | ICD-10-CM | POA: Diagnosis not present

## 2019-11-22 DIAGNOSIS — Z1389 Encounter for screening for other disorder: Secondary | ICD-10-CM | POA: Diagnosis not present

## 2019-11-22 DIAGNOSIS — I700 Atherosclerosis of aorta: Secondary | ICD-10-CM | POA: Diagnosis not present

## 2019-11-22 DIAGNOSIS — F329 Major depressive disorder, single episode, unspecified: Secondary | ICD-10-CM | POA: Diagnosis not present

## 2019-11-22 DIAGNOSIS — E1029 Type 1 diabetes mellitus with other diabetic kidney complication: Secondary | ICD-10-CM | POA: Diagnosis not present

## 2019-11-22 MED FILL — PROMETHAZINE 25 MG TABLET: 25 | 30 days supply | Qty: 30 | Fill #0

## 2019-11-26 ENCOUNTER — Other Ambulatory Visit: Payer: Self-pay

## 2019-11-26 ENCOUNTER — Telehealth: Payer: Self-pay | Admitting: Pulmonary Disease

## 2019-11-26 ENCOUNTER — Telehealth: Payer: Self-pay | Admitting: Primary Care

## 2019-11-26 ENCOUNTER — Ambulatory Visit (INDEPENDENT_AMBULATORY_CARE_PROVIDER_SITE_OTHER): Payer: 59 | Admitting: Primary Care

## 2019-11-26 DIAGNOSIS — J690 Pneumonitis due to inhalation of food and vomit: Secondary | ICD-10-CM | POA: Diagnosis not present

## 2019-11-26 DIAGNOSIS — R296 Repeated falls: Secondary | ICD-10-CM

## 2019-11-26 MED ORDER — AZITHROMYCIN 250 MG PO TABS: ORAL_TABLET | ORAL | 0 refills | Status: DC

## 2019-11-26 MED ORDER — TRAMADOL HCL 50 MG PO TABS: 50.0000 mg | ORAL_TABLET | Freq: Four times a day (QID) | ORAL | 0 refills | Status: AC | PRN

## 2019-11-26 NOTE — Telephone Encounter (Signed)
Patient fell and hit his head on 5/8 he fainted and woke up on floor. I asked patient if he called 911 or went to ED , patient stated no. I told him he might need to do that, as he lost consciousness and hit his head. Patient has an appointment with [facility] Tomorrow. Patient has seen Wyn Quaker in past. Will route to APP of the [facility] to see if patient needs to be seen sooner or go to the ED.    Sarah please advise.

## 2019-11-26 NOTE — Progress Notes (Signed)
Virtual Visit via Telephone Note  I connected with James [name] on 11/26/19 at  2:30 PM EDT by telephone and verified that I am speaking with the correct person using two identifiers.  Location: Patient: Home Provider: Home   I discussed the limitations, risks, security and privacy concerns of performing an evaluation and management service by telephone and the availability of in person appointments. I also discussed with the patient that there may be a patient responsible charge related to this service. The patient expressed understanding and agreed to proceed.   History of Present Illness:  64 year old male, former smoker. PMH significant for chronic asthmatic bronchitis, allergic rhinitis, CAD, GERD, DM type 1 (on insulin pump), HLD. Patient of Dr. Elsworth Soho. Maintained on Dulera 100, prn albuterol.    Asthma/bronchitis exacerbations: April 2020- Pred taper, zpack February 2020- doxycyline  December 2020- hospitalized, levaquin/prednisone taper   Previous [facility] pulmonary encounter: 06/25/2019 Patient contacted today for acute televisit. Reports increased cough x 1 week. Associated shortness of breath, wheezing and chest tightness. He ran out of his Symbicort 6 weeks ago, has been using Albuterol rescue inhaler 8-10x [place]. States that he has not filled Symbicort prescription d/t Cost of medication which is approx $70/month. Sounds like he may have been getting some type of patient assistance previously. He is diabetic and has an insulin pump, states that he can take prednisone safely. Experiences some nausea in the morning, unclear reason may be due to PND. No known sick contacts. Denies fever, chills, sweats, chest pain, abdominal pain, vomiting.   07/17/2019 Patient presents today for hospital follow-up. He was recently hospitalized 12/10-12/20 for asthma exacerbation/pneumonia treated with Levaquin and prednisone taper. He is doing much better but reports he is still not 100%. Reports that his  wheezing has improved. He does not have a significant cough. He has been taking Dulera as prescribed. Using albuterol nebulizer once daily as maintenance. He will finish prednisone course in 2-3 days. Saw his diabetic educator recently and insulin pump was re-programmed. Reports that his A1C was 6.5. He continues to smoke a few cigarettes daily.   11/09/19 Hospital fu with Wyn Quaker- Aspiration pneumonia 64 year old male former smoker followed in our office for asthma, recurrent aspiration pneumonia, and suspected bronchiectasis.  Patient presenting today as a hospital follow-up.  Patient feels that he would benefit from outpatient speech, swallow management.  He feels that the swallowing patterns that he was instructed by SLP while inpatient as well as during the outpatient study has been helpful.  He feels that he would benefit from more additional resources.  We will see how we can coordinate for the patient today.  Patient does have a flutter valve at home but he has not been using it.  Patient is due to get a repeat CT chest as a nodule follow-up in June.  It was initially felt back in February/2021 Dr. Elsworth Soho saw the patient last that given his asthmatic bronchitis as well as recurrent chronic cough.  There is a chance patient can have bronchiectasis.  We will discuss this today.  Patient remains on Advair 100.  Patient has previously taken Symbicort as well as Dulera in the past.  He has not noticed a difference with his cough with any of them.  He remains on Advair due to him finding this the most affordable.  He is open to all inhaler options.  11/26/2019  Patient fell 5/8 in the middle of the night. States that he woke up around 3am,  his arms and hands were throbbing. He had cervical fusion three years ago which has led to his difficultly swallowing. He is on gabapentin d/t throbbing in extremities. This doesn't happen as much now. He is used to this. He checked his BS which was 95. He decided to get  something to eat/drink because his glucose can drop at night. He went to drink gatorade unsure if he aspirated d/t large opening of bottle. Developed pain in his chest after drinking. He was resting in the family room and got up to let his wife know, he then came to and was on the floor. He had fallen backwards and hit his head. He has injured his ribs. He coughs frequently which is causing him pain. He has a lower lumbar back support to he uses when he cough. He is not on a blood thinner. He is not on daily Asprin but did take 325mg  for pain. Denies changes in mental status, headaches or vision changes. Patient lives with his wife who is a Marine scientist.    Observations/Objective:  - Able to speak in full sentences, no significant shortness of breath, wheezing or cough - Patient reports SBP 120  PFTs 09/18/18- FEV1 2.56 (75), ratio 76- Mild restriction  Imaging: 07/03/19 CTA- No evidence pulmonary embolus. Central bronchitis bilaterally. Areas of atelectatic change ineach lower lobe with suspected superimposed foci of pneumonia in these areas. No pleural effusions. Focal new area of ground-glass type opacity in the anterior segment of the left upper lobe. This area measures 1.2 x 0.8 cm.  Assessment and Plan:  Fall/ possible rib fracture: - Unwitnessed fall on 5/8, likely d/t hypoglycemia - Advised patient contact pcp  - No change in mental status, vision changes or HA - NOT on blood thinner, do not use NSAIDs  - RX tramadol 50mg  q 6 hours prn pain (not to be combined with other sedating medication) - Use abominal binder and cough pillow for support   Aspiration pneumonia: - Possible new/reccurrent aspiration pneumonia - Due for repeat HRCT in June, will see if we can move this up sooner  - Sending in azithromycin [facility] as directed) - Advised he use IS and flutter valve 5-10 breaths every hour while awake - Aspiration precautions reviewed and attached to AVS  Follow Up Instructions:   -  Move up HRCT to assess for aspiration pneumonia   I discussed the assessment and treatment plan with the patient. The patient was provided an opportunity to ask questions and all were answered. The patient agreed with the plan and demonstrated an understanding of the instructions.   The patient was advised to call back or seek an in-person evaluation if the symptoms worsen or if the condition fails to improve as anticipated.  I provided 30 minutes of non-face-to-face time during this encounter.   Martyn Ehrich, NP

## 2019-11-26 NOTE — Telephone Encounter (Signed)
Called and spoke with patient regarding his message. Patients appointment has been moved to today at 2:30 per [name] APP of [facility]. Nothing further needed at this time

## 2019-11-26 NOTE — Telephone Encounter (Signed)
Can we please move up HRCT scheduled for June. I would like it done in the next week if possible. Re: recurrent aspiration pneumonia

## 2019-11-26 NOTE — Patient Instructions (Addendum)
Orders: Please move up HRCT ASAP scheduled for June  re: aspiration pneumonia   Recommendations: Aspirations precautions attached Use incentive spirometer and flutter valve 5-10 breaths every hour while awake    Aspiration Precautions, Adult Aspiration is the breathing in (inhalation) of a liquid or object into the lungs. Things that can be inhaled into the lungs include:  Food.  Any type of liquid, such as drinks or saliva.  Stomach contents, such as vomit or stomach acid. What are the signs of aspiration? Signs of aspiration include:  Coughing after swallowing food or liquids.  Clearing the throat often while eating.  Trouble breathing. This may include: ? Breathing quickly. ? Breathing very slowly. ? Loud breathing. ? Rumbling sounds from the lungs while breathing.  Coughing up phlegm (sputum) that: ? Is yellow, tan, or green. ? Has pieces of food in it. ? Is bad-smelling.  Having a hoarse, barky cough.  Not being able to speak.  A hoarse voice.  Drooling while eating.  A feeling of fullness in the throat or a feeling that something is stuck in the throat.  Choking often.  Having a runny noise while eating.  Coughing when lying down or having to sit up quickly after lying down.  A change in skin color. The skin may look red or blue.  Fever.  Watery eyes.  Pain in the chest or back.  A pained look on the face. What are the complications of aspiration? Complications of aspiration include:  Losing weight because the person is not absorbing needed nutrients.  Loss of enjoyment and the social benefits of eating.  Choking.  Lung irritation, if someone aspirates acidic food or drinks.  Lung infection (pneumonia).  Collection of infected liquid (pus) in the lungs (lung abscess). In serious cases, death can occur. What can I do to prevent aspiration? Caring for someone who has a feeding tube If you are caring for someone who has a feeding tube who  cannot eat or drink safely through his or her mouth:  Keep the person in an upright position as much as possible.  Do not lay the person flat if he or she is getting continuous feedings. If you need to lay the person flat for any reason, turn the feeding pump off.  Check feeding tube residuals as told by your health care provider. Ask your health care provider what residual amount is too high. Caring for someone who can eat and drink safely by mouth If you are caring for someone who can eat and drink safely through his or her mouth:  Have the person sit in an upright position when eating food or drinking fluids. This can be done in two ways: ? Have the person sit up in a chair. ? If sitting in a chair is not possible, position the person in bed so he or she is upright.  Remind the person to eat slowly and chew well. Make sure the person is awake and alert while eating.  Do not distract the person. This is especially important for people with thinking or memory (cognitive) problems.  Allow foods to cool. Hot foods may be more difficult to swallow.  Provide small meals more frequently, instead of 3 large meals. This may reduce fatigue during eating.  Check the person's mouth thoroughly for leftover food after eating.  Keep the person sitting upright for 30-45 minutes after eating.  Do not serve food or drink during 2 hours or more before bedtime. General instructions Follow these  general guidelines to prevent aspiration in someone who can eat and drink safely by mouth:  Never put food or liquids in the mouth of a person who is not fully alert.  Feed small amounts of food. Do not force feed.  For a person who is on a diet for swallowing difficulty (dysphagia diet), follow the recommended food and drink consistency. For example, in dysphagia diet level 1, thicken liquids to pudding-like consistency.  Use as little water as possible when brushing the person's teeth or cleaning his or  her mouth.  Provide oral care before and after meals.  Use adaptive devices such as cut-out cups, straws, or utensils as told by the health care provider.  Crush pills and put them in soft food such as pudding or ice cream. Some pills should not be crushed. Check with the health care provider before crushing any medicine. Contact a health care provider if:  The person has a feeding tube, and the feeding tube residual amount is too high.  The person has a fever.  The person tries to avoid food or water, such as refusing to eat, drink, or be fed, or is eating less than normal.  The person may have aspirated food or liquid.  You notice warning signs, such as choking or coughing, when the person eats or drinks. Get help right away if:  The person has trouble breathing or starts to breathe quickly.  The person is breathing very slowly or stops breathing.  The person coughs a lot after eating or drinking.  The person has a long-lasting (chronic) cough.  The person coughs up thick, yellow, or tan sputum.  If someone is choking on food or an object, perform the Heimlich maneuver (abdominal thrusts).  The person has symptoms of pneumonia, such as: ? Coughing a lot. ? Coughing up mucus with a bad smell or blood in it. ? Feeling short of breath. ? Complaining of chest pain. ? Sweating, fever, and chills. ? Feeling tired. ? Complaining of trouble breathing. ? Wheezing.  The person cannot stop choking.  The person is unable to breathe, turns blue, faints, or seems confused. These symptoms may represent a serious problem that is an emergency. Do not wait to see if the symptoms will go away. Get medical help right away. Call your local emergency services (911 in the [place]  Summary  Aspiration is the breathing in (inhalation) of a liquid or object into the lungs. Things that can be inhaled into the lungs include food, liquids, saliva, or stomach contents.  Aspiration can cause  pneumonia or choking.  One sign of aspiration is coughing after swallowing food or liquids.  Contact a health care provider if you notice signs of aspiration. This information is not intended to replace advice given to you by your health care provider. Make sure you discuss any questions you have with your health care provider. Document Revised: 06/17/2017 Document Reviewed: 04/01/2016 Elsevier Patient Education  [place].

## 2019-11-26 NOTE — Telephone Encounter (Signed)
Schedule a tele visit with James [name] today, so she can determine if he needs to be seen by PCP today. Thanks

## 2019-11-27 ENCOUNTER — Ambulatory Visit: Payer: 59 | Admitting: Primary Care

## 2019-11-27 ENCOUNTER — Telehealth: Payer: Self-pay | Admitting: Primary Care

## 2019-11-27 DIAGNOSIS — R1310 Dysphagia, unspecified: Secondary | ICD-10-CM

## 2019-11-27 DIAGNOSIS — J690 Pneumonitis due to inhalation of food and vomit: Secondary | ICD-10-CM

## 2019-11-27 DIAGNOSIS — R059 Cough, unspecified: Secondary | ICD-10-CM

## 2019-11-27 NOTE — Telephone Encounter (Signed)
LMTCB x 1 

## 2019-11-28 ENCOUNTER — Other Ambulatory Visit: Payer: Self-pay | Admitting: Pulmonary Disease

## 2019-11-28 DIAGNOSIS — R55 Syncope and collapse: Secondary | ICD-10-CM

## 2019-11-28 DIAGNOSIS — R1310 Dysphagia, unspecified: Secondary | ICD-10-CM

## 2019-11-28 DIAGNOSIS — R296 Repeated falls: Secondary | ICD-10-CM

## 2019-11-28 NOTE — Telephone Encounter (Signed)
I have called the patient and transferred him over to Ginger at central scheduling to move up his appt

## 2019-11-28 NOTE — Telephone Encounter (Signed)
Spoke with patient. He is aware of Brian's recommendations. Did advise him that I would leave 2 sample cups at the door for him as well as an instruction sheet. He verbalized understanding.   Orders have been placed.   Nothing further needed at time of call.

## 2019-11-28 NOTE — Telephone Encounter (Signed)
11/28/2019   I have heard back from speech therapy.  They have given the suggestions listed below:   I saw this gentleman for a BSE as an inpatient at [facility] 11/01/19. He was anticipating DC that Jafari, so I wanted him to have an outpatient MBS. He has seen Garald Balding for speech therapy at the [facility] clinic in the past [phone]. Thank you!   The best way to order is to either call the number Early [facility] provided OR you can go through [facility] - go to referrals and select [facility] neuro. SLP should be included as an option (this is what the secretary at OP just told me, so hopefully that works.) However, I would like to chat with you briefly so we can figure out what happened on our end with the poor communication so that we can prevent it from happening again! Thanks, Aaron Edelman. I apologize for the delay   I have placed the referral for patient to be referred to neuro rehab with the hopes to see Glendell Docker as indicated above.  I will also speak with Dr. Elsworth Soho to see his thoughts regarding ordering a CT head as recommended by speech therapy to further evaluate if there is potentially a infarct that could be affecting patient swallowing.  They report they have seen that before especially difficult cases such as these.  Patient is scheduled for a high-resolution CT chest on 11/30/2019 which we could coordinate a CT head as well.  Wyn Quaker, FNP

## 2019-11-28 NOTE — Telephone Encounter (Signed)
Pt returning a call. Pt can be reached at [phone].

## 2019-11-28 NOTE — Telephone Encounter (Signed)
Patient checking on referral for speech therapy. Patient phone number is [phone].

## 2019-11-28 NOTE — Telephone Encounter (Signed)
Sorry to hear that he has not had any responses.  I had sent a message to speech therapy 2 weeks ago.  I will follow-up with them today to the best of my ability.  See the message sent to them below:   [place]out[place], Park Pope, [place]; [place], Enriqueta Shutter, CCC-SLP  [place],  [place] this patient in clinic.  Very nice gentleman.  He is interested in outpatient speech therapy and follow-up or additional outpatient resources.  Do you have any suggestions on how I can help coordinate this.  Is there a specific point person order order that I need to place?  Patient likely having recurrent aspiration as well as bronchiectasis.  Would like to minimize and control symptoms past weekend.  We will keep close follow-up with the patient in our clinic over the next 4 weeks.  If you have any thoughts or suggestions please feel free to send me a message back on here or call or text me.  Wyn Quaker, FNP   Pulmonary    Sputum culture order should be AFB, fungal as well as respiratory sputum.  I hope he was provided 2 cups when he was last seen.  If not then please provide these for him.  Also provide the instructions on how to produce sputum samples.  Wyn Quaker, FNP

## 2019-11-28 NOTE — Telephone Encounter (Signed)
Spoke with patient. He is aware that the order has been placed. I also provided him with the number to call. He verbalized understanding.   Nothing further needed at time of call.

## 2019-11-28 NOTE — Telephone Encounter (Signed)
Spoke with patient. He wanted to discuss two things.    1. He stated that he had an OV with Aaron Edelman last month and speech therapy was mentioned. He stated that no one has contacted him in regards to getting scheduled. He is still interested in starting speech therapy.   I looked at the last OV and found this:   1. Aspiration pneumonia of right lower lobe due to regurgitated food [facility])  I will follow up with speech therapy regarding outpatient options for support and therapy teaching for you  Continue maneuvers as instructed by speech therapist after outpatient exam  2. He also stated that it was mentioned during the same visit about if he wanted to complete sputum studies, we would order them for him. I did advise of the sputum collection process and advised him that I would call him back once I know the orders and amounts of cups needed. He is aware that he will need to pick up the cups from our office and drop of the samples at the [place] office.  He verbalized understanding.   Aaron Edelman, please advise. Thanks!

## 2019-11-30 ENCOUNTER — Emergency Department (HOSPITAL_COMMUNITY): Payer: 59

## 2019-11-30 ENCOUNTER — Other Ambulatory Visit: Payer: Self-pay | Admitting: Pulmonary Disease

## 2019-11-30 ENCOUNTER — Telehealth: Payer: Self-pay | Admitting: Pulmonary Disease

## 2019-11-30 ENCOUNTER — Observation Stay (HOSPITAL_COMMUNITY)
Admission: RE | Admit: 2019-11-30 | Discharge: 2019-11-30 | Disposition: A | Discharge status: Home / Self Care | Payer: 59 | Source: Ambulatory Visit | Attending: Pulmonary Disease | Admitting: Pulmonary Disease

## 2019-11-30 ENCOUNTER — Other Ambulatory Visit: Payer: Self-pay

## 2019-11-30 ENCOUNTER — Observation Stay (HOSPITAL_COMMUNITY)
Admission: EM | Admit: 2019-11-30 | Discharge: 2019-12-02 | Disposition: A | Discharge status: Home / Self Care | Payer: 59 | Attending: Internal Medicine | Admitting: Internal Medicine

## 2019-11-30 DIAGNOSIS — F419 Anxiety disorder, unspecified: Secondary | ICD-10-CM | POA: Diagnosis not present

## 2019-11-30 DIAGNOSIS — J181 Lobar pneumonia, unspecified organism: Secondary | ICD-10-CM | POA: Diagnosis not present

## 2019-11-30 DIAGNOSIS — J189 Pneumonia, unspecified organism: Principal | ICD-10-CM | POA: Diagnosis present

## 2019-11-30 DIAGNOSIS — E109 Type 1 diabetes mellitus without complications: Secondary | ICD-10-CM | POA: Diagnosis present

## 2019-11-30 DIAGNOSIS — Z7951 Long term (current) use of inhaled steroids: Secondary | ICD-10-CM | POA: Insufficient documentation

## 2019-11-30 DIAGNOSIS — J449 Chronic obstructive pulmonary disease, unspecified: Secondary | ICD-10-CM | POA: Diagnosis not present

## 2019-11-30 DIAGNOSIS — F329 Major depressive disorder, single episode, unspecified: Secondary | ICD-10-CM | POA: Diagnosis not present

## 2019-11-30 DIAGNOSIS — I2510 Atherosclerotic heart disease of native coronary artery without angina pectoris: Secondary | ICD-10-CM | POA: Insufficient documentation

## 2019-11-30 DIAGNOSIS — N289 Disorder of kidney and ureter, unspecified: Secondary | ICD-10-CM | POA: Insufficient documentation

## 2019-11-30 DIAGNOSIS — R296 Repeated falls: Secondary | ICD-10-CM

## 2019-11-30 DIAGNOSIS — I700 Atherosclerosis of aorta: Secondary | ICD-10-CM | POA: Diagnosis not present

## 2019-11-30 DIAGNOSIS — J690 Pneumonitis due to inhalation of food and vomit: Secondary | ICD-10-CM | POA: Insufficient documentation

## 2019-11-30 DIAGNOSIS — Z8249 Family history of ischemic heart disease and other diseases of the circulatory system: Secondary | ICD-10-CM | POA: Diagnosis not present

## 2019-11-30 DIAGNOSIS — N529 Male erectile dysfunction, unspecified: Secondary | ICD-10-CM | POA: Diagnosis not present

## 2019-11-30 DIAGNOSIS — Z79899 Other long term (current) drug therapy: Secondary | ICD-10-CM | POA: Insufficient documentation

## 2019-11-30 DIAGNOSIS — Z87891 Personal history of nicotine dependence: Secondary | ICD-10-CM | POA: Insufficient documentation

## 2019-11-30 DIAGNOSIS — J45909 Unspecified asthma, uncomplicated: Secondary | ICD-10-CM | POA: Diagnosis not present

## 2019-11-30 DIAGNOSIS — R569 Unspecified convulsions: Secondary | ICD-10-CM | POA: Diagnosis not present

## 2019-11-30 DIAGNOSIS — R55 Syncope and collapse: Secondary | ICD-10-CM

## 2019-11-30 DIAGNOSIS — R0602 Shortness of breath: Secondary | ICD-10-CM | POA: Diagnosis not present

## 2019-11-30 DIAGNOSIS — N200 Calculus of kidney: Secondary | ICD-10-CM | POA: Diagnosis not present

## 2019-11-30 DIAGNOSIS — R1310 Dysphagia, unspecified: Secondary | ICD-10-CM

## 2019-11-30 DIAGNOSIS — J45998 Other asthma: Secondary | ICD-10-CM | POA: Diagnosis present

## 2019-11-30 DIAGNOSIS — E785 Hyperlipidemia, unspecified: Secondary | ICD-10-CM | POA: Insufficient documentation

## 2019-11-30 DIAGNOSIS — Z20822 Contact with and (suspected) exposure to covid-19: Secondary | ICD-10-CM | POA: Insufficient documentation

## 2019-11-30 DIAGNOSIS — S0990XA Unspecified injury of head, initial encounter: Secondary | ICD-10-CM | POA: Diagnosis not present

## 2019-11-30 DIAGNOSIS — Z794 Long term (current) use of insulin: Secondary | ICD-10-CM | POA: Insufficient documentation

## 2019-11-30 DIAGNOSIS — R9389 Abnormal findings on diagnostic imaging of other specified body structures: Secondary | ICD-10-CM

## 2019-11-30 DIAGNOSIS — Z981 Arthrodesis status: Secondary | ICD-10-CM | POA: Insufficient documentation

## 2019-11-30 DIAGNOSIS — I6782 Cerebral ischemia: Secondary | ICD-10-CM | POA: Insufficient documentation

## 2019-11-30 DIAGNOSIS — E10319 Type 1 diabetes mellitus with unspecified diabetic retinopathy without macular edema: Secondary | ICD-10-CM | POA: Diagnosis not present

## 2019-11-30 DIAGNOSIS — R059 Cough, unspecified: Secondary | ICD-10-CM

## 2019-11-30 DIAGNOSIS — Z9641 Presence of insulin pump (external) (internal): Secondary | ICD-10-CM | POA: Insufficient documentation

## 2019-11-30 DIAGNOSIS — J398 Other specified diseases of upper respiratory tract: Secondary | ICD-10-CM | POA: Insufficient documentation

## 2019-11-30 DIAGNOSIS — R05 Cough: Secondary | ICD-10-CM | POA: Insufficient documentation

## 2019-11-30 LAB — CBG MONITORING, ED: Glucose-Capillary: 151 mg/dL — ABNORMAL HIGH (ref 70–99)

## 2019-11-30 MED ORDER — SODIUM CHLORIDE 0.9% FLUSH
3.0000 mL | Freq: Once | INTRAVENOUS | Status: AC
  Administered 2019-11-30: 3 mL via INTRAVENOUS

## 2019-11-30 MED ORDER — LEVOFLOXACIN 500 MG PO TABS: 500.0000 mg | ORAL_TABLET | Freq: Every day | ORAL | 0 refills | Status: DC

## 2019-11-30 NOTE — ED Triage Notes (Signed)
Pt states that he is having rib pain, thinks he has pnemonia. States he fainted last Thursday and hit his head and back. Stated that he had CT scan here this morning.

## 2019-11-30 NOTE — ED Notes (Signed)
Pt aware urine sample is needed and urinal is at bedside

## 2019-11-30 NOTE — Telephone Encounter (Signed)
Called and spoke with pt and stated to him the info about the CT chest results from [place]. Pt stated he would like a copy of the results so this has been printed for pt and placed in the mail for him. Nothing further needed.

## 2019-11-30 NOTE — Progress Notes (Signed)
No acute rib fractures noted. Orthopedic fixation hardware in the cervical spine. Pain likely muscular +/- new left sided pneumonia.

## 2019-11-30 NOTE — Telephone Encounter (Signed)
Patient upset about the CT scan, states they didn't say anything about broken ribs. Stated a CT head was done, and broken ribs were not mentioned, and only thing was mentioned was possibly pneumonia. Would like B Mack or his RN to call him back Please advise. [phone]

## 2019-12-01 ENCOUNTER — Encounter (HOSPITAL_COMMUNITY): Payer: Self-pay | Admitting: Internal Medicine

## 2019-12-01 DIAGNOSIS — I700 Atherosclerosis of aorta: Secondary | ICD-10-CM | POA: Diagnosis not present

## 2019-12-01 DIAGNOSIS — J398 Other specified diseases of upper respiratory tract: Secondary | ICD-10-CM | POA: Diagnosis not present

## 2019-12-01 DIAGNOSIS — E1069 Type 1 diabetes mellitus with other specified complication: Secondary | ICD-10-CM | POA: Diagnosis not present

## 2019-12-01 DIAGNOSIS — J45909 Unspecified asthma, uncomplicated: Secondary | ICD-10-CM | POA: Diagnosis not present

## 2019-12-01 DIAGNOSIS — I2510 Atherosclerotic heart disease of native coronary artery without angina pectoris: Secondary | ICD-10-CM | POA: Diagnosis not present

## 2019-12-01 DIAGNOSIS — I6782 Cerebral ischemia: Secondary | ICD-10-CM | POA: Diagnosis not present

## 2019-12-01 DIAGNOSIS — Z20822 Contact with and (suspected) exposure to covid-19: Secondary | ICD-10-CM | POA: Diagnosis not present

## 2019-12-01 DIAGNOSIS — J45998 Other asthma: Secondary | ICD-10-CM

## 2019-12-01 DIAGNOSIS — J189 Pneumonia, unspecified organism: Secondary | ICD-10-CM | POA: Diagnosis present

## 2019-12-01 DIAGNOSIS — N200 Calculus of kidney: Secondary | ICD-10-CM | POA: Diagnosis not present

## 2019-12-01 DIAGNOSIS — E785 Hyperlipidemia, unspecified: Secondary | ICD-10-CM | POA: Diagnosis not present

## 2019-12-01 LAB — CBC
HCT: 44.2 % (ref 39.0–52.0)
Hemoglobin: 15.4 g/dL (ref 13.0–17.0)
MCH: 32.3 pg (ref 26.0–34.0)
MCHC: 34.8 g/dL (ref 30.0–36.0)
MCV: 92.7 fL (ref 80.0–100.0)
Platelets: 258 10*3/uL (ref 150–400)
RBC: 4.77 MIL/uL (ref 4.22–5.81)
RDW: 11.5 % (ref 11.5–15.5)
WBC: 8.8 10*3/uL (ref 4.0–10.5)
nRBC: 0 % (ref 0.0–0.2)

## 2019-12-01 LAB — BASIC METABOLIC PANEL
Anion gap: 8 (ref 5–15)
BUN: 18 mg/dL (ref 8–23)
CO2: 25 mmol/L (ref 22–32)
Calcium: 8.9 mg/dL (ref 8.9–10.3)
Chloride: 100 mmol/L (ref 98–111)
Creatinine, Ser: 0.9 mg/dL (ref 0.61–1.24)
GFR calc Af Amer: >60 mL/min (ref >60)
GFR calc non Af Amer: >60 mL/min (ref >60)
Glucose, Bld: 142 mg/dL — ABNORMAL HIGH (ref 70–99)
Potassium: 4.2 mmol/L (ref 3.5–5.1)
Sodium: 133 mmol/L — ABNORMAL LOW (ref 135–145)

## 2019-12-01 LAB — COMPREHENSIVE METABOLIC PANEL
ALT: 14 U/L (ref 0–44)
AST: 12 U/L — ABNORMAL LOW (ref 15–41)
Albumin: 3.4 g/dL — ABNORMAL LOW (ref 3.5–5.0)
Alkaline Phosphatase: 74 U/L (ref 38–126)
Anion gap: 9 (ref 5–15)
BUN: 18 mg/dL (ref 8–23)
CO2: 22 mmol/L (ref 22–32)
Calcium: 8.7 mg/dL — ABNORMAL LOW (ref 8.9–10.3)
Chloride: 102 mmol/L (ref 98–111)
Creatinine, Ser: 0.79 mg/dL (ref 0.61–1.24)
GFR calc Af Amer: >60 mL/min (ref >60)
GFR calc non Af Amer: >60 mL/min (ref >60)
Glucose, Bld: 122 mg/dL — ABNORMAL HIGH (ref 70–99)
Potassium: 4.1 mmol/L (ref 3.5–5.1)
Sodium: 133 mmol/L — ABNORMAL LOW (ref 135–145)
Total Bilirubin: 1.1 mg/dL (ref 0.3–1.2)
Total Protein: 6.3 g/dL — ABNORMAL LOW (ref 6.5–8.1)

## 2019-12-01 LAB — CBC WITH DIFFERENTIAL/PLATELET
Abs Immature Granulocytes: 0.03 10*3/uL (ref 0.00–0.07)
Basophils Absolute: 0 10*3/uL (ref 0.0–0.1)
Basophils Relative: 0 %
Eosinophils Absolute: 0 10*3/uL (ref 0.0–0.5)
Eosinophils Relative: 1 %
HCT: 38 % — ABNORMAL LOW (ref 39.0–52.0)
Hemoglobin: 13.5 g/dL (ref 13.0–17.0)
Immature Granulocytes: 0 %
Lymphocytes Relative: 9 %
Lymphs Abs: 0.8 10*3/uL (ref 0.7–4.0)
MCH: 32.5 pg (ref 26.0–34.0)
MCHC: 35.5 g/dL (ref 30.0–36.0)
MCV: 91.6 fL (ref 80.0–100.0)
Monocytes Absolute: 0.6 10*3/uL (ref 0.1–1.0)
Monocytes Relative: 8 %
Neutro Abs: 6.7 10*3/uL (ref 1.7–7.7)
Neutrophils Relative %: 82 %
Platelets: 286 10*3/uL (ref 150–400)
RBC: 4.15 MIL/uL — ABNORMAL LOW (ref 4.22–5.81)
RDW: 11.5 % (ref 11.5–15.5)
WBC: 8.2 10*3/uL (ref 4.0–10.5)
nRBC: 0 % (ref 0.0–0.2)

## 2019-12-01 LAB — TROPONIN I (HIGH SENSITIVITY)
Troponin I (High Sensitivity): 2 ng/L (ref <18)
Troponin I (High Sensitivity): 3 ng/L (ref <18)

## 2019-12-01 LAB — GLUCOSE, CAPILLARY
Glucose-Capillary: 48 mg/dL — ABNORMAL LOW (ref 70–99)
Glucose-Capillary: 73 mg/dL (ref 70–99)

## 2019-12-01 LAB — SARS CORONAVIRUS 2 BY RT PCR (HOSPITAL ORDER, PERFORMED IN ~~LOC~~ HOSPITAL LAB): SARS Coronavirus 2: NEGATIVE

## 2019-12-01 LAB — D-DIMER, QUANTITATIVE: D-Dimer, Quant: 0.91 ug/mL-FEU — ABNORMAL HIGH (ref 0.00–0.50)

## 2019-12-01 LAB — CBG MONITORING, ED
Glucose-Capillary: 82 mg/dL (ref 70–99)
Glucose-Capillary: 55 mg/dL — ABNORMAL LOW (ref 70–99)
Glucose-Capillary: 115 mg/dL — ABNORMAL HIGH (ref 70–99)

## 2019-12-01 LAB — STREP PNEUMONIAE URINARY ANTIGEN: Strep Pneumo Urinary Antigen: NEGATIVE

## 2019-12-01 MED ORDER — ENOXAPARIN SODIUM 40 MG/0.4ML ~~LOC~~ SOLN
40.0000 mg | SUBCUTANEOUS | Status: DC
  Filled 2019-12-01: qty 0.4

## 2019-12-01 MED ORDER — INSULIN PUMP
Freq: Three times a day (TID) | SUBCUTANEOUS | Status: DC
  Administered 2019-12-01: 10.3 via SUBCUTANEOUS
  Administered 2019-12-01 – 2019-12-02 (×2): 1.4 via SUBCUTANEOUS
  Filled 2019-12-01: qty 1

## 2019-12-01 MED ORDER — GUAIFENESIN-DM 100-10 MG/5ML PO SYRP
5.0000 mL | ORAL_SOLUTION | ORAL | Status: DC | PRN
  Administered 2019-12-01 – 2019-12-02 (×3): 5 mL via ORAL
  Filled 2019-12-01 (×3): qty 10

## 2019-12-01 MED ORDER — LEVOFLOXACIN IN D5W 750 MG/150ML IV SOLN
750.0000 mg | Freq: Once | INTRAVENOUS | Status: AC
  Administered 2019-12-01: 750 mg via INTRAVENOUS
  Filled 2019-12-01: qty 150

## 2019-12-01 MED ORDER — ONDANSETRON HCL 4 MG PO TABS: 4.0000 mg | ORAL_TABLET | Freq: Four times a day (QID) | ORAL | Status: DC | PRN

## 2019-12-01 MED ORDER — FINASTERIDE 5 MG PO TABS
5.0000 mg | ORAL_TABLET | Freq: Every day | ORAL | Status: DC
  Administered 2019-12-01 – 2019-12-02 (×2): 5 mg via ORAL
  Filled 2019-12-01 (×3): qty 1

## 2019-12-01 MED ORDER — MOMETASONE FURO-FORMOTEROL FUM 100-5 MCG/ACT IN AERO
2.0000 | INHALATION_SPRAY | Freq: Two times a day (BID) | RESPIRATORY_TRACT | Status: DC
  Administered 2019-12-01 – 2019-12-02 (×3): 2 via RESPIRATORY_TRACT
  Filled 2019-12-01: qty 8.8

## 2019-12-01 MED ORDER — VARENICLINE TARTRATE 1 MG PO TABS
1.0000 mg | ORAL_TABLET | Freq: Two times a day (BID) | ORAL | Status: DC
  Administered 2019-12-01 – 2019-12-02 (×3): 1 mg via ORAL
  Filled 2019-12-01 (×5): qty 1

## 2019-12-01 MED ORDER — SODIUM CHLORIDE 0.9 % IV SOLN
2.0000 g | INTRAVENOUS | Status: DC
  Administered 2019-12-01: 2 g via INTRAVENOUS
  Filled 2019-12-01: qty 20
  Filled 2019-12-01: qty 2

## 2019-12-01 MED ORDER — SODIUM CHLORIDE 0.9 % IV SOLN
500.0000 mg | INTRAVENOUS | Status: DC
  Administered 2019-12-02: 500 mg via INTRAVENOUS
  Filled 2019-12-01: qty 500

## 2019-12-01 MED ORDER — PANTOPRAZOLE SODIUM 40 MG PO TBEC
40.0000 mg | DELAYED_RELEASE_TABLET | Freq: Every day | ORAL | Status: DC
  Administered 2019-12-01 – 2019-12-02 (×2): 40 mg via ORAL
  Filled 2019-12-01 (×2): qty 1

## 2019-12-01 MED ORDER — FENTANYL CITRATE (PF) 100 MCG/2ML IJ SOLN
100.0000 ug | Freq: Once | INTRAMUSCULAR | Status: AC
  Administered 2019-12-01: 100 ug via INTRAVENOUS
  Filled 2019-12-01: qty 2

## 2019-12-01 MED ORDER — LORATADINE 10 MG PO TABS
10.0000 mg | ORAL_TABLET | Freq: Every day | ORAL | Status: DC
  Administered 2019-12-01 – 2019-12-02 (×2): 10 mg via ORAL
  Filled 2019-12-01 (×2): qty 1

## 2019-12-01 MED ORDER — OXYCODONE HCL 5 MG PO TABS
5.0000 mg | ORAL_TABLET | Freq: Four times a day (QID) | ORAL | Status: DC | PRN
  Administered 2019-12-01 (×2): 5 mg via ORAL
  Filled 2019-12-01 (×2): qty 1

## 2019-12-01 MED ORDER — ONDANSETRON HCL 4 MG/2ML IJ SOLN
4.0000 mg | Freq: Four times a day (QID) | INTRAMUSCULAR | Status: DC | PRN
  Administered 2019-12-01 (×2): 4 mg via INTRAVENOUS
  Filled 2019-12-01 (×2): qty 2

## 2019-12-01 MED ORDER — KETOROLAC TROMETHAMINE 10 MG PO TABS
10.0000 mg | ORAL_TABLET | Freq: Four times a day (QID) | ORAL | Status: DC | PRN
  Administered 2019-12-01 – 2019-12-02 (×3): 10 mg via ORAL
  Filled 2019-12-01 (×5): qty 1

## 2019-12-01 MED ORDER — OXYCODONE HCL 5 MG PO TABS
10.0000 mg | ORAL_TABLET | Freq: Four times a day (QID) | ORAL | Status: DC | PRN
  Administered 2019-12-01: 5 mg via ORAL
  Administered 2019-12-02: 10 mg via ORAL
  Filled 2019-12-01 (×2): qty 2

## 2019-12-01 MED ORDER — SODIUM CHLORIDE 0.9 % IV SOLN: INTRAVENOUS | Status: AC

## 2019-12-01 MED ORDER — SIMVASTATIN 40 MG PO TABS
40.0000 mg | ORAL_TABLET | Freq: Every day | ORAL | Status: DC
  Administered 2019-12-01: 40 mg via ORAL
  Filled 2019-12-01: qty 1

## 2019-12-01 MED ORDER — ALBUTEROL SULFATE (2.5 MG/3ML) 0.083% IN NEBU
2.5000 mg | INHALATION_SOLUTION | Freq: Four times a day (QID) | RESPIRATORY_TRACT | Status: DC | PRN
  Administered 2019-12-02: 2.5 mg via RESPIRATORY_TRACT
  Filled 2019-12-01: qty 3

## 2019-12-01 NOTE — ED Notes (Signed)
Pt lying in bed, eye's closed, chest rising and falling. Pt awakens easily. Denies any needs. Will continue to monitor.

## 2019-12-01 NOTE — ED Notes (Signed)
Pt lying in bed. Warm blanket given. Denies any needs. Will continue to monitor.

## 2019-12-01 NOTE — H&P (Signed)
History and Physical    James [name] [id number] DOB: April 03, 1956 DOA: 11/30/2019  PCP: [name], MD  Patient coming from: Home.  Chief Complaint: Left-sided chest pain.  HPI: James [name] is a 64 y.o. male with history of diabetes mellitus type 1 on insulin pump, asthmatic bronchitis tobacco abuse hyperlipidemia CAD was admitted last month for aspiration and asthma exacerbation had a fall around a week ago about Nov 24, 2019 since it has been hurting on his left side mostly in the left posterior aspect of his left ribs had followed up with his pulmonologist who had arranged a chest CT and CT head which shows persistent left-sided pneumonia.  Due to worsening pain patient presents to the ER.  Patient states over the last 2 days patient has been having subjective feeling of fever chills.  Has been having some productive cough which is yellowish discoloration.  ED Course: In the ER patient was afebrile not hypoxic.  Has significant pain on deep inspiration.  Chest x-ray shows left-sided pneumonia.  Labs show sodium 133 high sensitive troponin was 2 EKG normal sinus rhythm CBC unremarkable.  Patient started on empiric antibiotics and admitted for further observation.  Review of Systems: As per HPI, rest all negative.   Past Medical History:  Diagnosis Date  . ANXIETY 04/03/2007  . Anxiety   . ASTHMA 09/06/2008  . Asthma   . ASTHMATIC BRONCHITIS, ACUTE 10/25/2008  . Bladder neck obstruction   . CARPAL TUNNEL SYNDROME, BILATERAL 07/31/2007   issues resolved, no surgery  . Cervical disc disease   . Chronic bronchitis ([place])    "get it about q yr" (02/12/2014)  . COPD (chronic obstructive pulmonary disease) ([place])   . CORONARY ARTERY DISEASE 04/03/2007  . DEPRESSION 04/03/2007  . Depression   . DIABETES MELLITUS, TYPE I 04/03/2007  . Diabetic retinopathy associated with diabetes mellitus due to underlying condition ([place]) 04/03/2007  . DM W/EYE MANIFESTATIONS, TYPE I, UNCONTROLLED 04/04/2007  .  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 ED (erectile dysfunction)   . History of kidney stones   . HYPERLIPIDEMIA 04/04/2007  . Pneumonia    "several times and again today" (02/13/2104)  . Renal insufficiency   . Seizures ([place])    "insulin seizure from time to time; none in the last couple years" (02/12/2014)  . Spinal stenosis     Past Surgical History:  Procedure Laterality Date  . ANTERIOR CERVICAL DECOMP/DISCECTOMY FUSION  2000   "couple screws and a plate"  . ANTERIOR CERVICAL DECOMP/DISCECTOMY FUSION N/A 06/24/2016   Procedure: ANTERIOR CERVICAL DECOMPRESSION/DISCECTOMY FUSION CERVICAL FOUR - CERVICAL FIVE, CERVICAL FIVE - CERVICAL SIX; REMOVAL TETHER CERVICAL PLATE;  Surgeon: Jovita Gamma, MD;  Location: [facility];  Service: Neurosurgery;  Laterality: N/A;  ANTERIOR CERVICAL DECOMPRESSION/DISCECTOMY FUSION CERVICAL FOUR - CERVICAL FIVE, CERVICAL FIVE - CERVICAL SIX; REMOVAL TETHER CERVICAL PLATE  . APPENDECTOMY    . BACK SURGERY    . CARDIAC CATHETERIZATION  1990's  . CATARACT EXTRACTION W/ INTRAOCULAR LENS  IMPLANT, BILATERAL Bilateral   . CYSTOSCOPY WITH RETROGRADE PYELOGRAM, URETEROSCOPY AND STENT PLACEMENT Bilateral 04/06/2013   Procedure: BILATERAL CYSTOSCOPY WITH RETROGRADE PYELOGRAMS, STENT PLACEMENTS AND LEFT URETEROSCOPY AND STONE REMOVAL;  Surgeon: Alexis Frock, MD;  Location: [facility];  Service: Urology;  Laterality: Bilateral;  . CYSTOSCOPY WITH STENT PLACEMENT Right 04/12/2013   Procedure: CYSTOSCOPY WITH STENT PLACEMENT;  Surgeon: Alexis Frock, MD;  Location: [facility];  Service: Urology;  Laterality: Right;  . CYSTOSCOPY/RETROGRADE/URETEROSCOPY Bilateral  04/12/2013   Procedure: CYSTOSCOPY/RETROGRADE/URETEROSCOPY;  Surgeon: Alexis Frock, MD;  Location: [facility];  Service: Urology;  Laterality: Bilateral;  RIGHT RETROGRADE   . HOLMIUM LASER APPLICATION Left [id number]   Procedure: HOLMIUM LASER APPLICATION;  Surgeon: Alexis Frock, MD;  Location: [facility];  Service:  Urology;  Laterality: Left;  . LUMBAR LAMINECTOMY/DECOMPRESSION MICRODISCECTOMY N/A 04/20/2019   Procedure: Lumbar microdisectomy and decompression L5-S1 left;  Surgeon: Latanya Maudlin, MD;  Location: [facility];  Service: Orthopedics;  Laterality: N/A;  23min  . LYMPH NODE DISSECTION  ~ 1960   groin  . stress cardiolite  09/06/2002  . TONSILLECTOMY    . VITRECTOMY Bilateral      reports that he quit smoking about 3 months ago. His smoking use included cigarettes. He smoked 0.00 packs per [place]. He has never used smokeless tobacco. He reports that he does not drink alcohol or use drugs.  Allergies  Allergen Reactions  . Augmentin [Amoxicillin-Pot Clavulanate] Nausea And Vomiting and Other (See Comments)    Did it involve swelling of the face/tongue/throat, SOB, or low BP? No Did it involve sudden or severe rash/hives, skin peeling, or any reaction on the inside of your mouth or nose? No Did you need to seek medical attention at a hospital or doctor's office? No When did it last happen?5+ years If all above answers are "NO", may proceed with cephalosporin use.   [name] [Escitalopram Oxalate] Itching    Family History  Problem Relation Age of Onset  . Stroke Father        strong FH cerebrovascular disease  . Diabetes Brother   . Heart disease Brother        CHF  . Cancer Mother   . Colon cancer Neg Hx     Prior to Admission medications   Medication Sig Start Date End Date Taking? Authorizing Provider  acetaminophen (TYLENOL) 500 MG tablet Take 1,000 mg by mouth every 6 (six) hours as needed for mild pain or headache.   Yes [provider]  albuterol (PROVENTIL) (2.5 MG/3ML) 0.083% nebulizer solution USE 1 VIAL VIA NEBULIZER EVERY 6 HOURS AS NEEDED FOR WHEEZING OR SHORTNESS OF BREATH Patient taking differently: Take 2.5 mg by nebulization every 6 (six) hours as needed for wheezing or shortness of breath.  06/26/19  Yes Rigoberto Noel, MD  albuterol (VENTOLIN HFA) 108  (90 Base) MCG/ACT inhaler INHALE 2 PUFFS BY MOUTH INTO THE LUNGS EVERY 6 HOURS AS NEEDED FOR WHEEZING OR SHORTNESS OF BREATH. Patient taking differently: Inhale 2 puffs into the lungs every 6 (six) hours as needed for wheezing or shortness of breath.  06/11/19  Yes Parrett, Tammy S, NP  fexofenadine (ALLEGRA) 180 MG tablet Take 180 mg by mouth daily.   Yes [provider]  finasteride (PROSCAR) 5 MG tablet Take 5 mg by mouth daily.   Yes [provider]  insulin lispro (HUMALOG) 100 UNIT/ML KwikPen Junior Inject 0.05 mLs (5 Units total) into the skin 3 (three) times daily before meals. 07/08/19  Yes Donne Hazel, MD  insulin lispro (HUMALOG) 100 UNIT/ML KwikPen Junior Sliding scale: CBG 70 - 120: 0 units  CBG 121 - 150: 2 units  CBG 151 - 200: 3 units  CBG 201 - 250: 5 units  CBG 251 - 300: 8 units  CBG 301 - 350: 11 units  CBG 351 - 400: 15 units 07/08/19  Yes Donne Hazel, MD  Insulin Pen Needle 31G X 5 MM MISC 1 Device by  Does not apply route 4 (four) times daily. For use with insulin pens 07/08/19  Yes Donne Hazel, MD  levofloxacin (LEVAQUIN) 500 MG tablet Take 1 tablet (500 mg total) by mouth daily. 11/30/19  Yes Lauraine Rinne, NP  mometasone-formoterol (DULERA) 100-5 MCG/ACT AERO Inhale 2 puffs into the lungs in the morning and at bedtime. 11/12/19  Yes Lauraine Rinne, NP  nicotine polacrilex (NICOTINE MINI) 2 MG lozenge Take 1 lozenge (2 mg total) by mouth as needed for smoking cessation. 08/13/19  Yes Martyn Ehrich, NP  pantoprazole (PROTONIX) 40 MG tablet Take 1 tablet (40 mg total) by mouth 2 (two) times daily. Take Protonix twice a [place] while on prednisone and then back down to daily Patient taking differently: Take 40 mg by mouth daily.  11/01/19  Yes Domenic Polite, MD  polyvinyl alcohol (LIQUIFILM TEARS) 1.4 % ophthalmic solution Place 1 drop into both eyes as needed for dry eyes.   Yes [provider]  promethazine (PHENERGAN) 25 MG tablet Take  25 mg by mouth every 8 (eight) hours as needed for nausea or vomiting.  11/22/19  Yes [provider]  simvastatin (ZOCOR) 40 MG tablet Take 40 mg by mouth at bedtime.    Yes [provider]  traMADol (ULTRAM) 50 MG tablet Take 1 tablet (50 mg total) by mouth every 6 (six) hours as needed for up to 7 days for moderate pain or severe pain. Do not combine with sedating medication (trazadone) 11/26/19 12/03/19 Yes Martyn Ehrich, NP  traZODone (DESYREL) 150 MG tablet Take 150 mg by mouth at bedtime.   Yes [provider]  varenicline (CHANTIX CONTINUING MONTH PAK) 1 MG tablet Take 1 tablet (1 mg total) by mouth 2 (two) times daily. 09/17/19  Yes Martyn Ehrich, NP  azithromycin [facility]) 250 MG tablet Zpack taper as directed 11/26/19   Martyn Ehrich, NP  Fluticasone-Salmeterol (ADVAIR DISKUS) 100-50 MCG/DOSE AEPB Inhale 1 puff into the lungs 2 (two) times daily. Patient not taking: Reported on 12/01/2019 08/13/19   Martyn Ehrich, NP  guaiFENesin (MUCINEX) 600 MG 12 hr tablet Take 2 tablets (1,200 mg total) by mouth 2 (two) times daily. Patient not taking: Reported on 12/01/2019 11/01/19   Domenic Polite, MD  HYDROcodone-homatropine [facility]) 5-1.5 MG/5ML syrup Take 5 mLs by mouth every 6 (six) hours as needed for cough. Patient not taking: Reported on 12/01/2019 10/26/19   Martyn Ehrich, NP  predniSONE (DELTASONE) 10 MG tablet Take 4 tabs po daily x 3 days; then 3 tabs daily x3 days; then 2 tabs daily x3 days; then 1 tab daily x 3 days; then stop Patient not taking: Reported on 12/01/2019 11/01/19   Domenic Polite, MD    Physical Exam: Constitutional: Moderately built and nourished. Vitals:   12/01/19 0130 12/01/19 0145 12/01/19 0200 12/01/19 0215  BP: 139/68 135/66 130/69 132/70  Pulse: 99 99 99 97  Resp: (!) 24 (!) 24 16 (!) 22  Temp:      TempSrc:      SpO2: 95% 95% 96% 96%  Weight:      Height:       Eyes: Anicteric no pallor. ENMT: No discharge  from the ears eyes nose or mouth. Neck: No mass felt.  No neck rigidity. Respiratory: No rhonchi or crepitations. Cardiovascular: S1-S2 heard. Abdomen: Soft nontender bowel sounds present. Musculoskeletal: No edema. Skin: No rash. Neurologic: Alert awake oriented time place and person.  Moves all extremities. Psychiatric: Appears normal  but normal affect.   Labs on Admission: I have personally reviewed following labs and imaging studies  CBC: Recent Labs  Lab 11/30/19 2313  WBC 8.8  HGB 15.4  HCT 44.2  MCV 92.7  PLT 0000000   Basic Metabolic Panel: Recent Labs  Lab 11/30/19 2313  NA 133*  K 4.2  CL 100  CO2 25  GLUCOSE 142*  BUN 18  CREATININE 0.90  CALCIUM 8.9   GFR: Estimated Creatinine Clearance: 82.9 mL/min (by C-G formula based on SCr of 0.9 mg/dL). Liver Function Tests: No results for input(s): AST, ALT, ALKPHOS, BILITOT, PROT, ALBUMIN in the last 168 hours. No results for input(s): LIPASE, AMYLASE in the last 168 hours. No results for input(s): AMMONIA in the last 168 hours. Coagulation Profile: No results for input(s): INR, PROTIME in the last 168 hours. Cardiac Enzymes: No results for input(s): CKTOTAL, CKMB, CKMBINDEX, TROPONINI in the last 168 hours. BNP (last 3 results) No results for input(s): PROBNP in the last 8760 hours. HbA1C: No results for input(s): HGBA1C in the last 72 hours. CBG: Recent Labs  Lab 11/30/19 2302  GLUCAP 151*   Lipid Profile: No results for input(s): CHOL, HDL, LDLCALC, TRIG, CHOLHDL, LDLDIRECT in the last 72 hours. Thyroid Function Tests: No results for input(s): TSH, T4TOTAL, FREET4, T3FREE, THYROIDAB in the last 72 hours. Anemia Panel: No results for input(s): VITAMINB12, FOLATE, FERRITIN, TIBC, IRON, RETICCTPCT in the last 72 hours. Urine analysis:    Component Value Date/Time   COLORURINE YELLOW 04/10/2017 [place] 04/10/2017 0743   LABSPEC 1.015 04/10/2017 0743   PHURINE 6.0 04/10/2017 0743    GLUCOSEU >=500 (A) 04/10/2017 0743   GLUCOSEU 500 (?) 05/08/2010 [place] 04/10/2017 0743   BILIRUBINUR NEGATIVE 04/10/2017 0743   KETONESUR NEGATIVE 04/10/2017 0743   PROTEINUR NEGATIVE 04/10/2017 0743   UROBILINOGEN 0.2 04/11/2013 0008   NITRITE NEGATIVE 04/10/2017 0743   LEUKOCYTESUR NEGATIVE 04/10/2017 0743   Sepsis Labs: @LABRCNTIP (procalcitonin:4,lacticidven:4) ) Recent Results (from the past 240 hour(s))  SARS Coronavirus 2 by RT PCR (hospital order, performed in [facility] hospital lab) Nasopharyngeal Nasopharyngeal Swab     Status: None   Collection Time: 12/01/19 12:20 AM   Specimen: Nasopharyngeal Swab  Result Value Ref Range Status   SARS Coronavirus 2 NEGATIVE NEGATIVE Final    Comment: (NOTE) SARS-CoV-2 target nucleic acids are NOT DETECTED. The SARS-CoV-2 RNA is generally detectable in upper and lower respiratory specimens during the acute phase of infection. The lowest concentration of SARS-CoV-2 viral copies this assay can detect is 250 copies / mL. A negative result does not preclude SARS-CoV-2 infection and should not be used as the sole basis for treatment or other patient management decisions.  A negative result may occur with improper specimen collection / handling, submission of specimen other than nasopharyngeal swab, presence of viral mutation(s) within the areas targeted by this assay, and inadequate number of viral copies (<250 copies / mL). A negative result must be combined with clinical observations, patient history, and epidemiological information. Fact Sheet for Patients:   StrictlyIdeas.no Fact Sheet for Healthcare Providers: BankingDealers.co.za This test is not yet approved or cleared  by the [place] FDA and has been authorized for detection and/or diagnosis of SARS-CoV-2 by FDA under an Emergency Use Authorization (EUA).  This EUA will remain in effect (meaning this test can be  used) for the duration of the COVID-19 declaration under Section 564(b)(1) of the Act, 21 U.S.C. section 360bbb-3(b)(1), unless  the authorization is terminated or revoked sooner. Performed at [facility], [place] [address], [place], [place] [address]      Radiological Exams on Admission: DG Chest 2 View  Result Date: 11/30/2019 CLINICAL DATA:  Shortness of breath EXAM: CHEST - 2 VIEW COMPARISON:  CT from earlier in the same [place]. FINDINGS: Cardiac shadow is within normal limits. Left lower lobe pneumonia is noted. No sizable effusion is seen. No other focal infiltrate is noted. No bony abnormality is seen. Postsurgical changes are noted in the cervical spine. IMPRESSION: Left lower lobe pneumonia. Electronically Signed   By: Inez Catalina M.D.   On: 11/30/2019 23:30   CT HEAD WO CONTRAST  Result Date: 11/30/2019 CLINICAL DATA:  64 year old male with history of dysphagia. History of trauma from a fall 1 week ago with injury to the back of the head. EXAM: CT HEAD WITHOUT CONTRAST TECHNIQUE: Contiguous axial images were obtained from the base of the skull through the vertex without intravenous contrast. COMPARISON:  Head CT 12/23/2015 FINDINGS: Brain: Patchy areas of decreased attenuation are noted throughout the deep and periventricular white matter of the cerebral hemispheres bilaterally, compatible with mild chronic microvascular ischemic disease. No evidence of acute infarction, hemorrhage, hydrocephalus, extra-axial collection or mass lesion/mass effect. Vascular: No hyperdense vessel or unexpected calcification. Skull: Normal. Negative for fracture or focal lesion. Sinuses/Orbits: No acute finding. Other: None. IMPRESSION: 1. No evidence of significant acute traumatic injury to the skull or brain. 2. Mild chronic microvascular ischemic changes in the cerebral white matter, as above. Electronically Signed   By: Vinnie Langton M.D.   On: 11/30/2019 08:36   CT Chest High  Resolution  Result Date: 11/30/2019 CLINICAL DATA:  64 year old male with history of persistent cough. EXAM: CT CHEST WITHOUT CONTRAST TECHNIQUE: Multidetector CT imaging of the chest was performed following the standard protocol without intravenous contrast. High resolution imaging of the lungs, as well as inspiratory and expiratory imaging, was performed. COMPARISON:  Chest CT 10/29/2019. FINDINGS: Cardiovascular: Heart size is normal. There is no significant pericardial fluid, thickening or pericardial calcification. There is aortic atherosclerosis, as well as atherosclerosis of the great vessels of the mediastinum and the coronary arteries, including calcified atherosclerotic plaque in the left main, left anterior descending, left circumflex and right coronary arteries. Mediastinum/Nodes: No pathologically enlarged mediastinal or hilar lymph nodes. Please note that accurate exclusion of hilar adenopathy is limited on noncontrast CT scans. Esophagus is unremarkable in appearance. No axillary lymphadenopathy. Lungs/Pleura: New airspace consolidation in the left lower lobe. Additional areas of thickening of the peribronchovascular interstitium with peribronchovascular ground-glass attenuation micro nodularity noted in the lower lobes and to a lesser extent in the dependent portions of the left upper lobe, most compatible with endobronchial spread of infection. Small amount of scarring in the inferior segment of the lingula and in the medial segment of the right middle lobe, similar to prior studies. No pleural effusions. 7 x 3 mm (mean diameter 5 mm) ill-defined nodule in the right upper lobe (axial image 154 of series 4), new compared to the prior study and likely of infectious or inflammatory etiology. No other larger more suspicious appearing pulmonary nodules or masses are noted. High-resolution images demonstrate no other areas of septal thickening, subpleural reticulation, traction bronchiectasis or  honeycombing to suggest interstitial lung disease. Inspiratory and expiratory imaging demonstrates partial collapse of the trachea and mainstem bronchi during expiration indicative of tracheobronchomalacia. Upper Abdomen: Multiple nonobstructive calculi are noted within the collecting systems of both  kidneys measuring up to 3 mm in the upper pole collecting system of the right kidney. Musculoskeletal: Orthopedic fixation hardware in the cervical spine. There are no aggressive appearing lytic or blastic lesions noted in the visualized portions of the skeleton. IMPRESSION: 1. Left lower lobe pneumonia with endobronchial spread of infection in the lungs, as above. 2. No findings to suggest interstitial lung disease. 3. Tracheobronchomalacia. 4. Aortic atherosclerosis, in addition to left main and 3 vessel coronary artery disease. Please note that although the presence of coronary artery calcium documents the presence of coronary artery disease, the severity of this disease and any potential stenosis cannot be assessed on this non-gated CT examination. Assessment for potential risk factor modification, dietary therapy or pharmacologic therapy may be warranted, if clinically indicated. 5. Nonobstructive calculi in the collecting systems of both kidneys measuring up to 3 mm in the upper pole collecting system of the right kidney. Aortic Atherosclerosis (ICD10-I70.0). Electronically Signed   By: Vinnie Langton M.D.   On: 11/30/2019 08:47    EKG: Independently reviewed.  Normal sinus rhythm.  Assessment/Plan Principal Problem:   Pneumonia Active Problems:   Asthma, persistent controlled   DM type 1 (diabetes mellitus, type 1) (HCC)   CAP (community acquired pneumonia)    1. Pneumonia -we will keep patient on empiric antibiotics.  Will get swallow evaluation.  Pain relief medications.  Check urine for Legionella and strep antigen.  Covid test is negative. 2. Pleuritic type of chest pain likely from  pneumonia.  On pain relief medications.  Check D-dimer. 3. CAD on aspirin and simvastatin. 4. Asthmatic bronchitis continue inhalers.  Not actively wheezing at this time.  Closely observe. 5. Admitted last month for aspiration.  Will get swallow evaluation. 6. Diabetes mellitus type 1 on insulin pump. 7. Unwitnessed recent fall a week ago.  CT head was unremarkable today.  EKG does not show any significant arrhythmias.  Closely observe intermittently.   DVT prophylaxis: Lovenox. Code Status: Full code. Family Communication: Discussed with patient. Disposition Plan: Home. Consults called: None. Admission status: Observation.   Rise Patience MD [facility] Pager [phone].  If 7PM-7AM, please contact night-coverage [email or web address] Password [facility]  12/01/2019, 4:46 AM

## 2019-12-01 NOTE — ED Provider Notes (Signed)
[facility] DEPT Provider Note   CSN: [id number] Arrival date & time: 11/30/19  2245     History Chief Complaint  Patient presents with  . Dizziness    Rib Cage pain N/V  . Nausea  . Near Syncope    James [name] is a 64 y.o. male.  The history is provided by the patient and the spouse.  Cough Severity:  Moderate Onset quality:  Gradual Timing:  Intermittent Progression:  Worsening Chronicity:  New Relieved by:  Nothing Worsened by:  Nothing Associated symptoms: chest pain and shortness of breath   Patient history of asthma, dysphagia, history of aspiration, diabetes presents with cough and shortness of breath. Patient reports she had a syncopal episode around May 8-he had associated chest pain/arm pain at that time.  When he fell,he struck his head.  He also sustained an injury to his left upper back.  No new neck pain.  No new arm or leg weakness.  Since that time he has had brief episodes of anterior chest pain. Seen on televisit by his pulmonologist on May 10.  He had an outpatient CT chest and CT head performed. Patient was given a Z-Pak, but he never started this.  He was then placed on Levaquin which he started today     Past Medical History:  Diagnosis Date  . ANXIETY 04/03/2007  . Anxiety   . ASTHMA 09/06/2008  . Asthma   . ASTHMATIC BRONCHITIS, ACUTE 10/25/2008  . Bladder neck obstruction   . CARPAL TUNNEL SYNDROME, BILATERAL 07/31/2007   issues resolved, no surgery  . Cervical disc disease   . Chronic bronchitis ([place])    "get it about q yr" (02/12/2014)  . COPD (chronic obstructive pulmonary disease) ([place])   . CORONARY ARTERY DISEASE 04/03/2007  . DEPRESSION 04/03/2007  . Depression   . DIABETES MELLITUS, TYPE I 04/03/2007  . Diabetic retinopathy associated with diabetes mellitus due to underlying condition ([place]) 04/03/2007  . DM W/EYE MANIFESTATIONS, TYPE I, UNCONTROLLED 04/04/2007  . DM W/RENAL MNFST, TYPE I, UNCONTROLLED  04/04/2007  . ED (erectile dysfunction)   . History of kidney stones   . HYPERLIPIDEMIA 04/04/2007  . Pneumonia    "several times and again today" (02/13/2104)  . Renal insufficiency   . Seizures ([place])    "insulin seizure from time to time; none in the last couple years" (02/12/2014)  . Spinal stenosis     Patient Active Problem List   Diagnosis Date Noted  . Medication management 11/09/2019  . Seizure disorder, secondary ([place]) 07/19/2019  . Abnormal CT of the chest 07/19/2019  . Acute asthma exacerbation 06/29/2019  . Asthma exacerbation 06/28/2019  . 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 Hypoglycemia due to insulin 04/20/2019  . DM type 1 (diabetes mellitus, type 1) ([place]) 04/20/2019  . HNP (herniated nucleus pulposus), cervical 06/24/2016  . Syncope 05/20/2016  . Tobacco abuse 05/20/2016  . Cervical disc disorder with radiculopathy of cervical region 05/20/2016  . Aspiration pneumonia ([place]) 02/12/2014  . Gastroparesis 02/10/2011  . Esophageal reflux 12/21/2010  . TOBACCO USE, QUIT 05/08/2010  . CONTUSION, RIGHT CHEST WALL 04/07/2010  . Chronic asthmatic bronchitis ([place]) 10/25/2008  . Asthma, persistent controlled 09/06/2008  . Cough 09/06/2008  . SHOULDER PAIN, RIGHT 08/19/2008  . BLADDER OUTLET OBSTRUCTION 04/15/2008  . RASH AND OTHER NONSPECIFIC SKIN ERUPTION 02/21/2008  . CARPAL TUNNEL SYNDROME, BILATERAL 07/31/2007  . Allergic rhinitis 07/31/2007  . RENAL INSUFFICIENCY 07/31/2007  . OTHER  TENOSYNOVITIS OF HAND AND WRIST 07/31/2007  . 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 HLD (hyperlipidemia) 04/04/2007  . DIABETIC RETINOPATHY, PROLIFERATIVE 04/03/2007  . ANXIETY 04/03/2007  . DEPRESSION 04/03/2007  . 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 CAD (coronary artery disease) 04/03/2007  . DYSHIDROSIS 04/03/2007     Past Surgical History:  Procedure Laterality Date  . ANTERIOR CERVICAL DECOMP/DISCECTOMY FUSION  2000   "couple screws and a plate"  . ANTERIOR CERVICAL DECOMP/DISCECTOMY FUSION N/A 06/24/2016   Procedure: ANTERIOR CERVICAL DECOMPRESSION/DISCECTOMY FUSION CERVICAL FOUR - CERVICAL FIVE, CERVICAL FIVE - CERVICAL SIX; REMOVAL TETHER CERVICAL PLATE;  Surgeon: Jovita Gamma, MD;  Location: [facility];  Service: Neurosurgery;  Laterality: N/A;  ANTERIOR CERVICAL DECOMPRESSION/DISCECTOMY FUSION CERVICAL FOUR - CERVICAL FIVE, CERVICAL FIVE - CERVICAL SIX; REMOVAL TETHER CERVICAL PLATE  . APPENDECTOMY    . BACK SURGERY    . CARDIAC CATHETERIZATION  1990's  . CATARACT EXTRACTION W/ INTRAOCULAR LENS  IMPLANT, BILATERAL Bilateral   . CYSTOSCOPY WITH RETROGRADE PYELOGRAM, URETEROSCOPY AND STENT PLACEMENT Bilateral 04/06/2013   Procedure: BILATERAL CYSTOSCOPY WITH RETROGRADE PYELOGRAMS, STENT PLACEMENTS AND LEFT URETEROSCOPY AND STONE REMOVAL;  Surgeon: Alexis Frock, MD;  Location: [facility];  Service: Urology;  Laterality: Bilateral;  . CYSTOSCOPY WITH STENT PLACEMENT Right 04/12/2013   Procedure: CYSTOSCOPY WITH STENT PLACEMENT;  Surgeon: Alexis Frock, MD;  Location: [facility];  Service: Urology;  Laterality: Right;  . CYSTOSCOPY/RETROGRADE/URETEROSCOPY Bilateral 04/12/2013   Procedure: CYSTOSCOPY/RETROGRADE/URETEROSCOPY;  Surgeon: Alexis Frock, MD;  Location: [facility];  Service: Urology;  Laterality: Bilateral;  RIGHT RETROGRADE   . HOLMIUM LASER APPLICATION Left [id number]   Procedure: HOLMIUM LASER APPLICATION;  Surgeon: Alexis Frock, MD;  Location: [facility];  Service: Urology;  Laterality: Left;  . LUMBAR LAMINECTOMY/DECOMPRESSION MICRODISCECTOMY N/A 04/20/2019   Procedure: Lumbar microdisectomy and decompression L5-S1 left;  Surgeon: Latanya Maudlin, MD;  Location: [facility];  Service: Orthopedics;  Laterality: N/A;  44min  . LYMPH NODE DISSECTION  ~ 1960   groin  . stress cardiolite  09/06/2002  .  TONSILLECTOMY    . VITRECTOMY Bilateral        Family History  Problem Relation Age of Onset  . Stroke Father        strong FH cerebrovascular disease  . Diabetes Brother   . Heart disease Brother        CHF  . Cancer Mother   . Colon cancer Neg Hx     Social History   Tobacco Use  . Smoking status: Former Smoker    Packs/Eye: 0.00    Types: Cigarettes    Quit date: 08/20/2019    Years since quitting: 0.2  . Smokeless tobacco: Never Used  . Tobacco comment: Successfully quit smoking 08/20/2019  Substance Use Topics  . Alcohol use: No  . Drug use: No    Home Medications Prior to Admission medications   Medication Sig Start Date End Date Taking? Authorizing Provider  acetaminophen (TYLENOL) 500 MG tablet Take 1,000 mg by mouth every 6 (six) hours as needed for mild pain or headache.    [provider]  albuterol (PROVENTIL) (2.5 MG/3ML) 0.083% nebulizer solution USE 1 VIAL VIA NEBULIZER EVERY 6 HOURS AS NEEDED FOR WHEEZING OR SHORTNESS OF BREATH Patient taking differently: Take 2.5 mg by nebulization every 6 (six) hours as needed for wheezing or shortness of breath.  06/26/19   Rigoberto Noel, MD  albuterol (  VENTOLIN HFA) 108 (90 Base) MCG/ACT inhaler INHALE 2 PUFFS BY MOUTH INTO THE LUNGS EVERY 6 HOURS AS NEEDED FOR WHEEZING OR SHORTNESS OF BREATH. Patient taking differently: Inhale 2 puffs into the lungs every 6 (six) hours as needed for wheezing or shortness of breath.  06/11/19   Parrett, Fonnie Mu, NP  azithromycin (ZITHROMAX) 250 MG tablet Zpack taper as directed 11/26/19   Martyn Ehrich, NP  fexofenadine (ALLEGRA) 180 MG tablet Take 180 mg by mouth daily.    [provider]  finasteride (PROSCAR) 5 MG tablet Take 5 mg by mouth daily.    [provider]  Fluticasone-Salmeterol (ADVAIR DISKUS) 100-50 MCG/DOSE AEPB Inhale 1 puff into the lungs 2 (two) times daily. 08/13/19   Martyn Ehrich, NP  guaiFENesin (MUCINEX) 600 MG 12 hr tablet Take 2  tablets (1,200 mg total) by mouth 2 (two) times daily. 11/01/19   Domenic Polite, MD  HYDROcodone-homatropine [facility]) 5-1.5 MG/5ML syrup Take 5 mLs by mouth every 6 (six) hours as needed for cough. 10/26/19   Martyn Ehrich, NP  insulin lispro (HUMALOG) 100 UNIT/ML KwikPen Junior Inject 0.05 mLs (5 Units total) into the skin 3 (three) times daily before meals. 07/08/19   Donne Hazel, MD  insulin lispro (HUMALOG) 100 UNIT/ML KwikPen Junior Sliding scale: CBG 70 - 120: 0 units  CBG 121 - 150: 2 units  CBG 151 - 200: 3 units  CBG 201 - 250: 5 units  CBG 251 - 300: 8 units  CBG 301 - 350: 11 units  CBG 351 - 400: 15 units 07/08/19   Donne Hazel, MD  Insulin Pen Needle 31G X 5 MM MISC 1 Device by Does not apply route 4 (four) times daily. For use with insulin pens 07/08/19   Donne Hazel, MD  levofloxacin (LEVAQUIN) 500 MG tablet Take 1 tablet (500 mg total) by mouth daily. 11/30/19   Lauraine Rinne, NP  mometasone-formoterol (DULERA) 100-5 MCG/ACT AERO Inhale 2 puffs into the lungs in the morning and at bedtime. 11/12/19   Lauraine Rinne, NP  Multiple Vitamins-Minerals (AIRBORNE PO) Take 3 tablets by mouth daily.    [provider]  nicotine polacrilex (NICOTINE MINI) 2 MG lozenge Take 1 lozenge (2 mg total) by mouth as needed for smoking cessation. 08/13/19   Martyn Ehrich, NP  pantoprazole (PROTONIX) 40 MG tablet Take 1 tablet (40 mg total) by mouth 2 (two) times daily. Take Protonix twice a [place] while on prednisone and then back down to daily 11/01/19   Domenic Polite, MD  polyvinyl alcohol (LIQUIFILM TEARS) 1.4 % ophthalmic solution Place 1 drop into both eyes as needed for dry eyes.    [provider]  predniSONE (DELTASONE) 10 MG tablet Take 4 tabs po daily x 3 days; then 3 tabs daily x3 days; then 2 tabs daily x3 days; then 1 tab daily x 3 days; then stop 11/01/19   Domenic Polite, MD  simvastatin (ZOCOR) 40 MG tablet Take 40 mg by mouth at bedtime.     [provider]  traMADol (ULTRAM) 50 MG tablet Take 1 tablet (50 mg total) by mouth every 6 (six) hours as needed for up to 7 days for moderate pain or severe pain. Do not combine with sedating medication (trazadone) 11/26/19 12/03/19  Martyn Ehrich, NP  traZODone (DESYREL) 150 MG tablet Take 150 mg by mouth at bedtime.    [provider]  varenicline (CHANTIX CONTINUING MONTH PAK)  1 MG tablet Take 1 tablet (1 mg total) by mouth 2 (two) times daily. 09/17/19   Martyn Ehrich, NP    Allergies    Augmentin [amoxicillin-pot clavulanate] and Lexapro [escitalopram oxalate]  Review of Systems   Review of Systems  Constitutional: Positive for fatigue.  Respiratory: Positive for cough and shortness of breath.   Cardiovascular: Positive for chest pain.  Musculoskeletal: Negative for neck pain.  Neurological: Negative for weakness and numbness.  All other systems reviewed and are negative.   Physical Exam Updated Vital Signs BP (!) 156/90 (BP Location: Right Arm)   Pulse (!) 104   Temp 98.7 F (37.1 C) (Oral)   Resp 19   Ht 1.753 m (5\' 9" )   Wt 81.6 kg   SpO2 94%   BMI 26.58 kg/m   Physical Exam CONSTITUTIONAL: Chronically ill-appearing HEAD: Normocephalic/atraumatic EYES: EOMI/PERRL ENMT: Mucous membranes moist NECK: supple no meningeal signs SPINE/BACK:entire spine nontender, no bruising/crepitance/stepoffs noted to spine CV: S1/S2 noted, no murmurs/rubs/gallops noted LUNGS: Coarse breath sounds bilaterally, coughs frequently, referred upper airway sounds, mild tachypnea Chest-diffuse tenderness to left posterior chest.  No bruising or crepitus ABDOMEN: soft, nontender, no rebound or guarding, bowel sounds noted throughout abdomen GU:no cva tenderness NEURO: Pt is awake/alert/appropriate, moves all extremitiesx4.  No facial droop.  No arm or leg drift.  Equal handgrips noted EXTREMITIES: pulses normal/equal, full ROM, pelvis stable, all other extremities/joints  palpated/ranged and nontender SKIN: warm, color normal PSYCH: no abnormalities of mood noted, alert and oriented to situation  ED Results / Procedures / Treatments   Labs (all labs ordered are listed, but only abnormal results are displayed) Labs Reviewed  BASIC METABOLIC PANEL - Abnormal; Notable for the following components:      Result Value   Sodium 133 (*)    Glucose, Bld 142 (*)    All other components within normal limits  CBG MONITORING, ED - Abnormal; Notable for the following components:   Glucose-Capillary 151 (*)    All other components within normal limits  SARS CORONAVIRUS 2 BY RT PCR (HOSPITAL ORDER, [facility])  CBC  TROPONIN I (HIGH SENSITIVITY)    EKG EKG Interpretation  Date/Time:  Friday Nov 30 2019 23:38:16 EDT Ventricular Rate:  94 PR Interval:    QRS Duration: 94 QT Interval:  335 QTC Calculation: 419 R Axis:   84 Text Interpretation: Sinus rhythm Probable left atrial enlargement Borderline right axis deviation No significant change since last tracing Confirmed by Ripley Fraise [phone]) on 11/30/2019 11:43:38 PM   Radiology DG Chest 2 View  Result Date: 11/30/2019 CLINICAL DATA:  Shortness of breath EXAM: CHEST - 2 VIEW COMPARISON:  CT from earlier in the same [place]. FINDINGS: Cardiac shadow is within normal limits. Left lower lobe pneumonia is noted. No sizable effusion is seen. No other focal infiltrate is noted. No bony abnormality is seen. Postsurgical changes are noted in the cervical spine. IMPRESSION: Left lower lobe pneumonia. Electronically Signed   By: Inez Catalina M.D.   On: 11/30/2019 23:30   CT HEAD WO CONTRAST  Result Date: 11/30/2019 CLINICAL DATA:  64 year old male with history of dysphagia. History of trauma from a fall 1 week ago with injury to the back of the head. EXAM: CT HEAD WITHOUT CONTRAST TECHNIQUE: Contiguous axial images were obtained from the base of the skull through the vertex without intravenous  contrast. COMPARISON:  Head CT 12/23/2015 FINDINGS: Brain: Patchy areas of decreased attenuation are noted throughout the  deep and periventricular white matter of the cerebral hemispheres bilaterally, compatible with mild chronic microvascular ischemic disease. No evidence of acute infarction, hemorrhage, hydrocephalus, extra-axial collection or mass lesion/mass effect. Vascular: No hyperdense vessel or unexpected calcification. Skull: Normal. Negative for fracture or focal lesion. Sinuses/Orbits: No acute finding. Other: None. IMPRESSION: 1. No evidence of significant acute traumatic injury to the skull or brain. 2. Mild chronic microvascular ischemic changes in the cerebral white matter, as above. Electronically Signed   By: Vinnie Langton M.D.   On: 11/30/2019 08:36   CT Chest High Resolution  Result Date: 11/30/2019 CLINICAL DATA:  64 year old male with history of persistent cough. EXAM: CT CHEST WITHOUT CONTRAST TECHNIQUE: Multidetector CT imaging of the chest was performed following the standard protocol without intravenous contrast. High resolution imaging of the lungs, as well as inspiratory and expiratory imaging, was performed. COMPARISON:  Chest CT 10/29/2019. FINDINGS: Cardiovascular: Heart size is normal. There is no significant pericardial fluid, thickening or pericardial calcification. There is aortic atherosclerosis, as well as atherosclerosis of the great vessels of the mediastinum and the coronary arteries, including calcified atherosclerotic plaque in the left main, left anterior descending, left circumflex and right coronary arteries. Mediastinum/Nodes: No pathologically enlarged mediastinal or hilar lymph nodes. Please note that accurate exclusion of hilar adenopathy is limited on noncontrast CT scans. Esophagus is unremarkable in appearance. No axillary lymphadenopathy. Lungs/Pleura: New airspace consolidation in the left lower lobe. Additional areas of thickening of the  peribronchovascular interstitium with peribronchovascular ground-glass attenuation micro nodularity noted in the lower lobes and to a lesser extent in the dependent portions of the left upper lobe, most compatible with endobronchial spread of infection. Small amount of scarring in the inferior segment of the lingula and in the medial segment of the right middle lobe, similar to prior studies. No pleural effusions. 7 x 3 mm (mean diameter 5 mm) ill-defined nodule in the right upper lobe (axial image 154 of series 4), new compared to the prior study and likely of infectious or inflammatory etiology. No other larger more suspicious appearing pulmonary nodules or masses are noted. High-resolution images demonstrate no other areas of septal thickening, subpleural reticulation, traction bronchiectasis or honeycombing to suggest interstitial lung disease. Inspiratory and expiratory imaging demonstrates partial collapse of the trachea and mainstem bronchi during expiration indicative of tracheobronchomalacia. Upper Abdomen: Multiple nonobstructive calculi are noted within the collecting systems of both kidneys measuring up to 3 mm in the upper pole collecting system of the right kidney. Musculoskeletal: Orthopedic fixation hardware in the cervical spine. There are no aggressive appearing lytic or blastic lesions noted in the visualized portions of the skeleton. IMPRESSION: 1. Left lower lobe pneumonia with endobronchial spread of infection in the lungs, as above. 2. No findings to suggest interstitial lung disease. 3. Tracheobronchomalacia. 4. Aortic atherosclerosis, in addition to left main and 3 vessel coronary artery disease. Please note that although the presence of coronary artery calcium documents the presence of coronary artery disease, the severity of this disease and any potential stenosis cannot be assessed on this non-gated CT examination. Assessment for potential risk factor modification, dietary therapy or  pharmacologic therapy may be warranted, if clinically indicated. 5. Nonobstructive calculi in the collecting systems of both kidneys measuring up to 3 mm in the upper pole collecting system of the right kidney. Aortic Atherosclerosis (ICD10-I70.0). Electronically Signed   By: Vinnie Langton M.D.   On: 11/30/2019 08:47    Procedures Procedures    Medications Ordered in ED Medications  levofloxacin (LEVAQUIN) IVPB 750 mg (has no administration in time range)  sodium chloride flush (NS) 0.9 % injection 3 mL (3 mLs Intravenous Given 11/30/19 2341)  fentaNYL (SUBLIMAZE) injection 100 mcg (100 mcg Intravenous Given 12/01/19 0020)    ED Course  I have reviewed the triage vital signs and the nursing notes.  Pertinent labs & imaging results that were available during my care of the patient were reviewed by me and considered in my medical decision making (see chart for details).    MDM Rules/Calculators/A&P                       This patient presents to the ED for concern of cough and shortness of breath, this involves an extensive number of treatment options, and is a complaint that carries with it a high risk of complications and morbidity.  The differential diagnosis includes pneumonia, CHF, PE, ACS Recent CT head was negative.  CT chest revealed pneumonia, no signs of rib fractures  Lab Tests:   I Ordered, reviewed, and interpreted labs, which included electrolytes, complete blood count, troponin  Medicines ordered:   I ordered medication Levaquin for pneumonia, fentanyl for pain  Imaging Studies ordered:   I ordered imaging studies which included chest x-ray   I independently visualized and interpreted imaging which showed pneumonia  Additional history obtained:   Additional history obtained from wife  Previous records obtained and reviewed   Consultations Obtained:   I consulted [facility] hospitalist and discussed lab and imaging findings  Reevaluation:  After the  interventions stated above, I reevaluated the patient and found pt has improved  Critical Interventions:  . IV antibiotics and pain control  Final Clinical Impression(s) / ED Diagnoses Final diagnoses:  Community acquired pneumonia of left lower lobe of lung    Rx / DC Orders ED Discharge Orders    None       Ripley Fraise, MD 12/01/19 [phone]

## 2019-12-01 NOTE — ED Notes (Signed)
Pt lying in bed. States he is starting to feel better. Will continue to monitor.

## 2019-12-01 NOTE — Progress Notes (Signed)
No charge FU note. Chart reviewed, D-dimer elevated but patient has known PNA, not tachycardic or hypoxic on room air. Continue present management with IV antibiotics for PNA.

## 2019-12-01 NOTE — ED Notes (Signed)
Pt deferred lovenox at this time. Patient encouraged to pump feet and move legs frequently due to blood clot risk. RN stated for patient to do this every time a commercial is on TV. Patient reports understanding and that he will move his legs and feet.

## 2019-12-01 NOTE — ED Notes (Signed)
Urine culture sent down to lab  

## 2019-12-01 NOTE — ED Notes (Signed)
Pt lying in bed. Full monitor placed. Pt asking for pain meds. MD to see pt. Will continue to monitor.

## 2019-12-01 NOTE — ED Notes (Signed)
Pt lying in bed. abx started. Denies any needs. Will continue to monitor.

## 2019-12-01 NOTE — Progress Notes (Signed)
A consult was received from an ED physician for [place]  per pharmacy dosing.  The patient's profile has been reviewed for ht/wt/allergies/indication/available labs.    A one time order has been placed for levaquin 750 mg IV x1 (dose already given in ED).  Further antibiotics/pharmacy consults should be ordered by admitting physician if indicated.                       Thank you, Lynelle Doctor 12/01/2019  12:55 AM

## 2019-12-01 NOTE — ED Notes (Signed)
Patient assisted to stand and try to urinate. Hospital bed moved into room due to extended wait for hospital room. Patient made more comfortable in hospital bed and assisted to call his wife.

## 2019-12-01 NOTE — Evaluation (Signed)
Clinical/Bedside Swallow Evaluation Patient Details  Name: James [name] MRN: [id number] Date of Birth: [birth year]  Today's Date: 12/01/2019 Time: SLP Start Time (ACUTE ONLY): 1350 SLP Stop Time (ACUTE ONLY): 1402 SLP Time Calculation (min) (ACUTE ONLY): 12 min  Past Medical History:  Past Medical History:  Diagnosis Date  . ANXIETY 04/03/2007  . Anxiety   . ASTHMA 09/06/2008  . Asthma   . ASTHMATIC BRONCHITIS, ACUTE 10/25/2008  . Bladder neck obstruction   . CARPAL TUNNEL SYNDROME, BILATERAL 07/31/2007   issues resolved, no surgery  . Cervical disc disease   . Chronic bronchitis ([place])    "get it about q yr" (02/12/2014)  . COPD (chronic obstructive pulmonary disease) ([place])   . CORONARY ARTERY DISEASE 04/03/2007  . DEPRESSION 04/03/2007  . Depression   . DIABETES MELLITUS, TYPE I 04/03/2007  . Diabetic retinopathy associated with diabetes mellitus due to underlying condition ([place]) 04/03/2007  . DM W/EYE MANIFESTATIONS, TYPE I, UNCONTROLLED 04/04/2007  . 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 ED (erectile dysfunction)   . History of kidney stones   . HYPERLIPIDEMIA 04/04/2007  . Pneumonia    "several times and again today" (02/13/2104)  . Renal insufficiency   . Seizures ([place])    "insulin seizure from time to time; none in the last couple years" (02/12/2014)  . Spinal stenosis    Past Surgical History:  Past Surgical History:  Procedure Laterality Date  . ANTERIOR CERVICAL DECOMP/DISCECTOMY FUSION  2000   "couple screws and a plate"  . ANTERIOR CERVICAL DECOMP/DISCECTOMY FUSION N/A 06/24/2016   Procedure: ANTERIOR CERVICAL DECOMPRESSION/DISCECTOMY FUSION CERVICAL FOUR - CERVICAL FIVE, CERVICAL FIVE - CERVICAL SIX; REMOVAL TETHER CERVICAL PLATE;  Surgeon: Jovita Gamma, MD;  Location: [facility];  Service: Neurosurgery;  Laterality: N/A;  ANTERIOR CERVICAL DECOMPRESSION/DISCECTOMY FUSION CERVICAL FOUR - CERVICAL FIVE, CERVICAL FIVE - CERVICAL SIX; REMOVAL TETHER CERVICAL PLATE  .  APPENDECTOMY    . BACK SURGERY    . CARDIAC CATHETERIZATION  1990's  . CATARACT EXTRACTION W/ INTRAOCULAR LENS  IMPLANT, BILATERAL Bilateral   . CYSTOSCOPY WITH RETROGRADE PYELOGRAM, URETEROSCOPY AND STENT PLACEMENT Bilateral 04/06/2013   Procedure: BILATERAL CYSTOSCOPY WITH RETROGRADE PYELOGRAMS, STENT PLACEMENTS AND LEFT URETEROSCOPY AND STONE REMOVAL;  Surgeon: [place] Frock, MD;  Location: [facility];  Service: Urology;  Laterality: Bilateral;  . CYSTOSCOPY WITH STENT PLACEMENT Right 04/12/2013   Procedure: CYSTOSCOPY WITH STENT PLACEMENT;  Surgeon: [place] Frock, MD;  Location: [facility];  Service: Urology;  Laterality: Right;  . CYSTOSCOPY/RETROGRADE/URETEROSCOPY Bilateral 04/12/2013   Procedure: CYSTOSCOPY/RETROGRADE/URETEROSCOPY;  Surgeon: [place] Frock, MD;  Location: [facility];  Service: Urology;  Laterality: Bilateral;  RIGHT RETROGRADE   . HOLMIUM LASER APPLICATION Left [id number]   Procedure: HOLMIUM LASER APPLICATION;  Surgeon: [place] Frock, MD;  Location: [facility];  Service: Urology;  Laterality: Left;  . LUMBAR LAMINECTOMY/DECOMPRESSION MICRODISCECTOMY N/A 04/20/2019   Procedure: Lumbar microdisectomy and decompression L5-S1 left;  Surgeon: Latanya Maudlin, MD;  Location: [facility];  Service: Orthopedics;  Laterality: N/A;  19min  . LYMPH NODE DISSECTION  ~ 1960   groin  . stress cardiolite  09/06/2002  . TONSILLECTOMY    . VITRECTOMY Bilateral    HPI:  James [name] is a 64 y.o. male with history of diabetes mellitus type 1 on insulin pump, asthmatic bronchitis tobacco abuse hyperlipidemia CAD was admitted last month for aspiration (MD suspected reflux) and asthma exacerbation had a fall around a week ago about Nov 24, 2019 since it has been hurting  on his left side mostly in the left posterior aspect of his left ribs had followed up with his pulmonologist who had arranged a chest CT and CT head which shows persistent left-sided pneumonia.  Due to worsening pain patient presents to the ER. Pt has  a history of mild chronic dysphagia, attended an OP MBS on 11/08/19 "pharyngeal dysphagia due to incomplete epiglottic inversion over the laryngeal vestibule as well as impaired timing of laryngeal vestibule closure (LVC).  There was intermittent, trace aspiration before the swallow, generally sensed by the pt and eliciting a cough response." The mendelsohn maneuver decreased teh quantitiy of aspiration. No aspiration with nectar.      Assessment / Plan / Recommendation Clinical Impression  Pt demonstrates stable chronic dysphagia; he reprots he is currently in the process of following up with OP SLP. He has started using a mendelsohn maneuver and per his report he "lifts up his adams apple and keeps it up while he is swallowing." Subjectively his actual success with this strategy is inconsistent. He does not demonstrate any cough following sips of thin liquids until the end after also having a cookie. COUghing is painful due to rib fx. SLP advised pt that he may have less coughing with nectar thick liquids. If he would like to consume nectar thick he could let his nurse know. For now he wants to continue current diet. Will f/u for tolerance and any further need for diet modification next week.  SLP Visit Diagnosis: Dysphagia, oropharyngeal phase (R13.12)    Aspiration Risk  Moderate aspiration risk    Diet Recommendation Regular;Thin liquid   Liquid Administration via: Cup;Straw Medication Administration: Whole meds with liquid Supervision: Patient able to self feed Compensations: Slow rate;Small sips/bites;Effortful swallow Postural Changes: Seated upright at 90 degrees;Remain upright for at least 30 minutes after po intake    Other  Recommendations Oral Care Recommendations: Oral care BID   Follow up Recommendations Outpatient SLP      Frequency and Duration            Prognosis        Swallow Study   General HPI: James [name] is a 64 y.o. male with history of diabetes mellitus type  1 on insulin pump, asthmatic bronchitis tobacco abuse hyperlipidemia CAD was admitted last month for aspiration (MD suspected reflux) and asthma exacerbation had a fall around a week ago about Nov 24, 2019 since it has been hurting on his left side mostly in the left posterior aspect of his left ribs had followed up with his pulmonologist who had arranged a chest CT and CT head which shows persistent left-sided pneumonia.  Due to worsening pain patient presents to the ER. Pt has a history of mild chronic dysphagia, attended an OP MBS on 11/08/19 "pharyngeal dysphagia due to incomplete epiglottic inversion over the laryngeal vestibule as well as impaired timing of laryngeal vestibule closure (LVC).  There was intermittent, trace aspiration before the swallow, generally sensed by the pt and eliciting a cough response." The mendelsohn maneuver decreased teh quantitiy of aspiration. No aspiration with nectar.    Type of Study: Bedside Swallow Evaluation Previous Swallow Assessment: see HPI Diet Prior to this Study: Regular;Thin liquids Temperature Spikes Noted: No Respiratory Status: Room air History of Recent Intubation: No Behavior/Cognition: Alert;Cooperative;Pleasant mood Oral Cavity Assessment: Within Functional Limits Oral Care Completed by SLP: No Oral Cavity - Dentition: Adequate natural dentition Vision: Functional for self-feeding Self-Feeding Abilities: Able to feed self Patient Positioning: Upright in bed  Baseline Vocal Quality: Normal Volitional Cough: Congested;Weak Volitional Swallow: Able to elicit    Oral/Motor/Sensory Function Overall Oral Motor/Sensory Function: Within functional limits   Ice Chips Ice chips: Not tested   Thin Liquid Thin Liquid: Within functional limits Presentation: Cup;Straw;Self Fed    Nectar Thick Nectar Thick Liquid: Within functional limits Presentation: Cup   Honey Thick Honey Thick Liquid: Not tested   Puree Puree: Within functional  limits Presentation: Self Fed;Spoon   Solid     Solid: Within functional limits Presentation: Elyn Peers 12/01/2019,2:07 PM

## 2019-12-02 DIAGNOSIS — J189 Pneumonia, unspecified organism: Secondary | ICD-10-CM | POA: Diagnosis not present

## 2019-12-02 DIAGNOSIS — I700 Atherosclerosis of aorta: Secondary | ICD-10-CM | POA: Diagnosis not present

## 2019-12-02 DIAGNOSIS — I2510 Atherosclerotic heart disease of native coronary artery without angina pectoris: Secondary | ICD-10-CM | POA: Diagnosis not present

## 2019-12-02 DIAGNOSIS — I6782 Cerebral ischemia: Secondary | ICD-10-CM | POA: Diagnosis not present

## 2019-12-02 DIAGNOSIS — J45909 Unspecified asthma, uncomplicated: Secondary | ICD-10-CM | POA: Diagnosis not present

## 2019-12-02 DIAGNOSIS — N200 Calculus of kidney: Secondary | ICD-10-CM | POA: Diagnosis not present

## 2019-12-02 DIAGNOSIS — Z20822 Contact with and (suspected) exposure to covid-19: Secondary | ICD-10-CM | POA: Diagnosis not present

## 2019-12-02 DIAGNOSIS — E785 Hyperlipidemia, unspecified: Secondary | ICD-10-CM | POA: Diagnosis not present

## 2019-12-02 DIAGNOSIS — J398 Other specified diseases of upper respiratory tract: Secondary | ICD-10-CM | POA: Diagnosis not present

## 2019-12-02 LAB — CBC
HCT: 35.9 % — ABNORMAL LOW (ref 39.0–52.0)
Hemoglobin: 12.8 g/dL — ABNORMAL LOW (ref 13.0–17.0)
MCH: 31.8 pg (ref 26.0–34.0)
MCHC: 35.7 g/dL (ref 30.0–36.0)
MCV: 89.1 fL (ref 80.0–100.0)
Platelets: 244 10*3/uL (ref 150–400)
RBC: 4.03 MIL/uL — ABNORMAL LOW (ref 4.22–5.81)
RDW: 11.5 % (ref 11.5–15.5)
WBC: 4 10*3/uL (ref 4.0–10.5)
nRBC: 0 % (ref 0.0–0.2)

## 2019-12-02 LAB — BASIC METABOLIC PANEL
Anion gap: 10 (ref 5–15)
BUN: 19 mg/dL (ref 8–23)
CO2: 18 mmol/L — ABNORMAL LOW (ref 22–32)
Calcium: 8 mg/dL — ABNORMAL LOW (ref 8.9–10.3)
Chloride: 108 mmol/L (ref 98–111)
Creatinine, Ser: 0.8 mg/dL (ref 0.61–1.24)
GFR calc Af Amer: >60 mL/min (ref >60)
GFR calc non Af Amer: >60 mL/min (ref >60)
Glucose, Bld: 126 mg/dL — ABNORMAL HIGH (ref 70–99)
Potassium: 4.3 mmol/L (ref 3.5–5.1)
Sodium: 136 mmol/L (ref 135–145)

## 2019-12-02 LAB — GLUCOSE, CAPILLARY
Glucose-Capillary: 102 mg/dL — ABNORMAL HIGH (ref 70–99)
Glucose-Capillary: 106 mg/dL — ABNORMAL HIGH (ref 70–99)
Glucose-Capillary: 115 mg/dL — ABNORMAL HIGH (ref 70–99)
Glucose-Capillary: 158 mg/dL — ABNORMAL HIGH (ref 70–99)
Glucose-Capillary: 50 mg/dL — ABNORMAL LOW (ref 70–99)

## 2019-12-02 MED ORDER — KETOROLAC TROMETHAMINE 10 MG PO TABS: 10.0000 mg | ORAL_TABLET | Freq: Four times a day (QID) | ORAL | 0 refills | Status: DC | PRN

## 2019-12-02 NOTE — Progress Notes (Signed)
Patient given discharge instructions and all questions answered. Patient awaiting transportation.

## 2019-12-02 NOTE — Discharge Summary (Signed)
Discharge Summary  James [name] [id number] DOB: [birth year]  PCP: [name], MD  Admit date: 11/30/2019 Discharge date: 12/02/2019  Recommendations for Outpatient Follow-up:  1. FU with Dr. Elsworth Soho as previously scheduled. 2. PCP in 2 weeks as needed.  3. Outpatient SLP Therapy, which Pulmonary is managing per patient.  Discharge Diagnoses:  Active Hospital Problems   Diagnosis Date Noted  . Pneumonia 12/01/2019  . CAP (community acquired pneumonia) 12/01/2019  . DM type 1 (diabetes mellitus, type 1) ([place]) 04/20/2019  . Asthma, persistent controlled 09/06/2008    Resolved Hospital Problems  No resolved problems to display.   Discharge Condition: Stable   Diet recommendation: Diabetic diet  Vitals:   12/02/19 0800 12/02/19 1155  BP: 137/69 137/66  Pulse:    Resp:    Temp: 97.8 F (36.6 C) 98 F (36.7 C)  SpO2:     HPI and Brief Hospital Course:  This is a pleasant 64 year old Caucasian male with a history of ALS admitted this is a pleasant 64 year old male with a history of insulin-dependent type 1 diabetes, asthmatic bronchitis, tobacco abuse, hyperlipidemia, chronic aspiration and recurrent pulmonary infections being admitted to the hospital with cough, dyspnea, posterior left-sided chest pain.  Repeat chest imaging shows evidence of left-sided pneumonia, he was started on empiric azithromycin and Rocephin, also started him yesterday afternoon on p.o. Toradol.  Chest pain was described as pleuritic, however on further interaction with the patient, he says that he does not have pain with deep breaths, only with coughing, the pain started couple of weeks ago after he fell onto his back after syncope at home.  CT scan of the head in the emergency department unremarkable, he also had high-resolution CT scan of the chest which shows groundglass opacities and a left-sided consolidation.  He had recently been started on oral Levaquin on the [place] of admission to the ER by his  pulmonologist.  Patient is currently stable, with some mild chest pain which is improved with the Toradol.  He does have elevated D-dimer however he has a known pneumonia, he does not really have pleuritic chest pain, he is not hypoxic.  I feel that PE is quite unlikely as we have other explanations for his symptoms.  On further discussion with the patient, we have decided that he can be discharged home from the hospital today as he is not requiring oxygen, IV antibiotics, or any other inpatient care.  He will discharge home from the hospital today, he will continue Toradol as it is helping his chest pain, he will continue oral Levaquin that was prescribed by his pulmonologist, and will follow up with them later this week at a previously scheduled appointment.  The patient was also seen during this hospital stay by speech and language therapy, they recommended regular diet with thin liquids, however they would like him to continue outpatient speech therapy.  The patient is agreeable to this, it is being arranged by his pulmonologist already per the patient.  Discharge details, plan of care and follow up instructions were discussed with patient and any available family or care providers. Patient and family are in agreement with discharge from the hospital today and all questions were answered to their satisfaction.  Procedures:  None   Consultations:  None   Discharge Exam: BP 137/66 (BP Location: Left Arm)   Pulse 76   Temp 98 F (36.7 C) (Oral)   Resp 16   Ht 5\' 9"  (1.753 m)   Wt 81.6 kg  SpO2 96%   BMI 26.58 kg/m  General:  Alert, oriented, calm, in no acute distress  Eyes: EOMI, clear sclerea Neck: supple, no masses, trachea mildline  Cardiovascular: RRR, no murmurs or rubs, no peripheral edema  Respiratory: No tachypnea or respiratory distress, intermittent dry cough, he has some diffuse crackles in the left lung, no wheezing rhonchi or acute respiratory distress. Abdomen: soft,  nontender, nondistended, normal bowel tones heard  Skin: dry, no rashes  Musculoskeletal: no joint effusions, normal range of motion  Psychiatric: appropriate affect, normal speech  Neurologic: extraocular muscles intact, clear speech, moving all extremities with intact sensorium   Discharge Instructions You were cared for by a hospitalist during your hospital stay. If you have any questions about your discharge medications or the care you received while you were in the hospital after you are discharged, you can call the unit and asked to speak with the hospitalist on call if the hospitalist that took care of you is not available. Once you are discharged, your primary care physician will handle any further medical issues. Please note that NO REFILLS for any discharge medications will be authorized once you are discharged, as it is imperative that you return to your primary care physician (or establish a relationship with a primary care physician if you do not have one) for your aftercare needs so that they can reassess your need for medications and monitor your lab values.  Discharge Instructions    Increase activity slowly   Complete by: As directed      Allergies as of 12/02/2019      Reactions   Augmentin [amoxicillin-pot Clavulanate] Nausea And Vomiting, Other (See Comments)   Did it involve swelling of the face/tongue/throat, SOB, or low BP? No Did it involve sudden or severe rash/hives, skin peeling, or any reaction on the inside of your mouth or nose? No Did you need to seek medical attention at a hospital or doctor's office? No When did it last happen?5+ years If all above answers are "NO", may proceed with cephalosporin use.   Lexapro [escitalopram Oxalate] Itching      Medication List    STOP taking these medications   azithromycin 250 MG tablet Commonly known as: ZITHROMAX     TAKE these medications   acetaminophen 500 MG tablet Commonly known as: TYLENOL Take 1,000  mg by mouth every 6 (six) hours as needed for mild pain or headache.   albuterol 108 (90 Base) MCG/ACT inhaler Commonly known as: VENTOLIN HFA INHALE 2 PUFFS BY MOUTH INTO THE LUNGS EVERY 6 HOURS AS NEEDED FOR WHEEZING OR SHORTNESS OF BREATH. What changed: See the new instructions.   albuterol (2.5 MG/3ML) 0.083% nebulizer solution Commonly known as: PROVENTIL USE 1 VIAL VIA NEBULIZER EVERY 6 HOURS AS NEEDED FOR WHEEZING OR SHORTNESS OF BREATH What changed: See the new instructions.   fexofenadine 180 MG tablet Commonly known as: ALLEGRA Take 180 mg by mouth daily.   finasteride 5 MG tablet Commonly known as: PROSCAR Take 5 mg by mouth daily.   Fluticasone-Salmeterol 100-50 MCG/DOSE Aepb Commonly known as: Advair Diskus Inhale 1 puff into the lungs 2 (two) times daily.   guaiFENesin 600 MG 12 hr tablet Commonly known as: MUCINEX Take 2 tablets (1,200 mg total) by mouth 2 (two) times daily.   HYDROcodone-homatropine 5-1.5 MG/5ML syrup Commonly known as: HYCODAN Take 5 mLs by mouth every 6 (six) hours as needed for cough.   insulin lispro 100 UNIT/ML KwikPen Junior Commonly known as: HUMALOG  Inject 0.05 mLs (5 Units total) into the skin 3 (three) times daily before meals.   insulin lispro 100 UNIT/ML KwikPen Junior Commonly known as: HUMALOG Sliding scale: CBG 70 - 120: 0 units  CBG 121 - 150: 2 units  CBG 151 - 200: 3 units  CBG 201 - 250: 5 units  CBG 251 - 300: 8 units  CBG 301 - 350: 11 units  CBG 351 - 400: 15 units   Insulin Pen Needle 31G X 5 MM Misc 1 Device by Does not apply route 4 (four) times daily. For use with insulin pens   ketorolac 10 MG tablet Commonly known as: TORADOL Take 1 tablet (10 mg total) by mouth every 6 (six) hours as needed for moderate pain.   levofloxacin 500 MG tablet Commonly known as: LEVAQUIN Take 1 tablet (500 mg total) by mouth daily.   mometasone-formoterol 100-5 MCG/ACT Aero Commonly known as: DULERA Inhale 2 puffs into  the lungs in the morning and at bedtime.   nicotine polacrilex 2 MG lozenge Commonly known as: Nicotine Mini Take 1 lozenge (2 mg total) by mouth as needed for smoking cessation.   pantoprazole 40 MG tablet Commonly known as: PROTONIX Take 1 tablet (40 mg total) by mouth 2 (two) times daily. Take Protonix twice a [place] while on prednisone and then back down to daily What changed:   when to take this  additional instructions   polyvinyl alcohol 1.4 % ophthalmic solution Commonly known as: LIQUIFILM TEARS Place 1 drop into both eyes as needed for dry eyes.   predniSONE 10 MG tablet Commonly known as: DELTASONE Take 4 tabs po daily x 3 days; then 3 tabs daily x3 days; then 2 tabs daily x3 days; then 1 tab daily x 3 days; then stop   promethazine 25 MG tablet Commonly known as: PHENERGAN Take 25 mg by mouth every 8 (eight) hours as needed for nausea or vomiting.   simvastatin 40 MG tablet Commonly known as: ZOCOR Take 40 mg by mouth at bedtime.   traMADol 50 MG tablet Commonly known as: ULTRAM Take 1 tablet (50 mg total) by mouth every 6 (six) hours as needed for up to 7 days for moderate pain or severe pain. Do not combine with sedating medication (trazadone)   traZODone 150 MG tablet Commonly known as: DESYREL Take 150 mg by mouth at bedtime.   varenicline 1 MG tablet Commonly known as: Chantix Continuing Month Pak Take 1 tablet (1 mg total) by mouth 2 (two) times daily.      Allergies  Allergen Reactions  . Augmentin [Amoxicillin-Pot Clavulanate] Nausea And Vomiting and Other (See Comments)    Did it involve swelling of the face/tongue/throat, SOB, or low BP? No Did it involve sudden or severe rash/hives, skin peeling, or any reaction on the inside of your mouth or nose? No Did you need to seek medical attention at a hospital or doctor's office? No When did it last happen?5+ years If all above answers are "NO", may proceed with cephalosporin use.   [name]  [Escitalopram Oxalate] Itching      The results of significant diagnostics from this hospitalization (including imaging, microbiology, ancillary and laboratory) are listed below for reference.    Significant Diagnostic Studies: DG Chest 2 View  Result Date: 11/30/2019 CLINICAL DATA:  Shortness of breath EXAM: CHEST - 2 VIEW COMPARISON:  CT from earlier in the same [place]. FINDINGS: Cardiac shadow is within normal limits. Left lower lobe pneumonia is noted.  No sizable effusion is seen. No other focal infiltrate is noted. No bony abnormality is seen. Postsurgical changes are noted in the cervical spine. IMPRESSION: Left lower lobe pneumonia. Electronically Signed   By: Inez Catalina M.D.   On: 11/30/2019 23:30   CT HEAD WO CONTRAST  Result Date: 11/30/2019 CLINICAL DATA:  64 year old male with history of dysphagia. History of trauma from a fall 1 week ago with injury to the back of the head. EXAM: CT HEAD WITHOUT CONTRAST TECHNIQUE: Contiguous axial images were obtained from the base of the skull through the vertex without intravenous contrast. COMPARISON:  Head CT 12/23/2015 FINDINGS: Brain: Patchy areas of decreased attenuation are noted throughout the deep and periventricular white matter of the cerebral hemispheres bilaterally, compatible with mild chronic microvascular ischemic disease. No evidence of acute infarction, hemorrhage, hydrocephalus, extra-axial collection or mass lesion/mass effect. Vascular: No hyperdense vessel or unexpected calcification. Skull: Normal. Negative for fracture or focal lesion. Sinuses/Orbits: No acute finding. Other: None. IMPRESSION: 1. No evidence of significant acute traumatic injury to the skull or brain. 2. Mild chronic microvascular ischemic changes in the cerebral white matter, as above. Electronically Signed   By: Vinnie Langton M.D.   On: 11/30/2019 08:36   CT Chest High Resolution  Result Date: 11/30/2019 CLINICAL DATA:  64 year old male with history of  persistent cough. EXAM: CT CHEST WITHOUT CONTRAST TECHNIQUE: Multidetector CT imaging of the chest was performed following the standard protocol without intravenous contrast. High resolution imaging of the lungs, as well as inspiratory and expiratory imaging, was performed. COMPARISON:  Chest CT 10/29/2019. FINDINGS: Cardiovascular: Heart size is normal. There is no significant pericardial fluid, thickening or pericardial calcification. There is aortic atherosclerosis, as well as atherosclerosis of the great vessels of the mediastinum and the coronary arteries, including calcified atherosclerotic plaque in the left main, left anterior descending, left circumflex and right coronary arteries. Mediastinum/Nodes: No pathologically enlarged mediastinal or hilar lymph nodes. Please note that accurate exclusion of hilar adenopathy is limited on noncontrast CT scans. Esophagus is unremarkable in appearance. No axillary lymphadenopathy. Lungs/Pleura: New airspace consolidation in the left lower lobe. Additional areas of thickening of the peribronchovascular interstitium with peribronchovascular ground-glass attenuation micro nodularity noted in the lower lobes and to a lesser extent in the dependent portions of the left upper lobe, most compatible with endobronchial spread of infection. Small amount of scarring in the inferior segment of the lingula and in the medial segment of the right middle lobe, similar to prior studies. No pleural effusions. 7 x 3 mm (mean diameter 5 mm) ill-defined nodule in the right upper lobe (axial image 154 of series 4), new compared to the prior study and likely of infectious or inflammatory etiology. No other larger more suspicious appearing pulmonary nodules or masses are noted. High-resolution images demonstrate no other areas of septal thickening, subpleural reticulation, traction bronchiectasis or honeycombing to suggest interstitial lung disease. Inspiratory and expiratory imaging  demonstrates partial collapse of the trachea and mainstem bronchi during expiration indicative of tracheobronchomalacia. Upper Abdomen: Multiple nonobstructive calculi are noted within the collecting systems of both kidneys measuring up to 3 mm in the upper pole collecting system of the right kidney. Musculoskeletal: Orthopedic fixation hardware in the cervical spine. There are no aggressive appearing lytic or blastic lesions noted in the visualized portions of the skeleton. IMPRESSION: 1. Left lower lobe pneumonia with endobronchial spread of infection in the lungs, as above. 2. No findings to suggest interstitial lung disease. 3. Tracheobronchomalacia. 4. Aortic atherosclerosis,  in addition to left main and 3 vessel coronary artery disease. Please note that although the presence of coronary artery calcium documents the presence of coronary artery disease, the severity of this disease and any potential stenosis cannot be assessed on this non-gated CT examination. Assessment for potential risk factor modification, dietary therapy or pharmacologic therapy may be warranted, if clinically indicated. 5. Nonobstructive calculi in the collecting systems of both kidneys measuring up to 3 mm in the upper pole collecting system of the right kidney. Aortic Atherosclerosis (ICD10-I70.0). Electronically Signed   By: Vinnie Langton M.D.   On: 11/30/2019 08:47   DG SWALLOW [facility] SPEECH PATH  Result Date: 11/08/2019 Objective Swallowing Evaluation: Type of Study: MBS-Modified Barium Swallow Study  Patient Details Name: Seanpaul Mcghie Michie MRN: [id number] Date of Birth: 01-23-1956 Today's Date: 11/08/2019 Time: SLP Start Time (ACUTE ONLY): 1100 -SLP Stop Time (ACUTE ONLY): 1135 SLP Time Calculation (min) (ACUTE ONLY): 35 min Past Medical History: Past Medical History: Diagnosis Date . ANXIETY 04/03/2007 . Anxiety  . ASTHMA 09/06/2008 . Asthma  . ASTHMATIC BRONCHITIS, ACUTE 10/25/2008 . Bladder neck obstruction  . CARPAL TUNNEL SYNDROME,  BILATERAL 07/31/2007  issues resolved, no surgery . Cervical disc disease  . Chronic bronchitis ([place])   "get it about q yr" (02/12/2014) . COPD (chronic obstructive pulmonary disease) ([place])  . CORONARY ARTERY DISEASE 04/03/2007 . DEPRESSION 04/03/2007 . Depression  . DIABETES MELLITUS, TYPE I 04/03/2007 . Diabetic retinopathy associated with diabetes mellitus due to underlying condition ([place]) 04/03/2007 . DM W/EYE MANIFESTATIONS, TYPE I, UNCONTROLLED 04/04/2007 . DM W/RENAL MNFST, TYPE I, UNCONTROLLED 04/04/2007 . ED (erectile dysfunction)  . History of kidney stones  . HYPERLIPIDEMIA 04/04/2007 . Pneumonia   "several times and again today" (02/13/2104) . Renal insufficiency  . Seizures ([place])   "insulin seizure from time to time; none in the last couple years" (02/12/2014) . Spinal stenosis  Past Surgical History: Past Surgical History: Procedure Laterality Date . ANTERIOR CERVICAL DECOMP/DISCECTOMY FUSION  2000  "couple screws and a plate" . ANTERIOR CERVICAL DECOMP/DISCECTOMY FUSION N/A 06/24/2016  Procedure: ANTERIOR CERVICAL DECOMPRESSION/DISCECTOMY FUSION CERVICAL FOUR - CERVICAL FIVE, CERVICAL FIVE - CERVICAL SIX; REMOVAL TETHER CERVICAL PLATE;  Surgeon: Jovita Gamma, MD;  Location: [place];  Service: Neurosurgery;  Laterality: N/A;  ANTERIOR CERVICAL DECOMPRESSION/DISCECTOMY FUSION CERVICAL FOUR - CERVICAL FIVE, CERVICAL FIVE - CERVICAL SIX; REMOVAL TETHER CERVICAL PLATE . APPENDECTOMY   . BACK SURGERY   . CARDIAC CATHETERIZATION  1990's . CATARACT EXTRACTION W/ INTRAOCULAR LENS  IMPLANT, BILATERAL Bilateral  . CYSTOSCOPY WITH RETROGRADE PYELOGRAM, URETEROSCOPY AND STENT PLACEMENT Bilateral 04/06/2013  Procedure: BILATERAL CYSTOSCOPY WITH RETROGRADE PYELOGRAMS, STENT PLACEMENTS AND LEFT URETEROSCOPY AND STONE REMOVAL;  Surgeon: Alexis Frock, MD;  Location: [facility];  Service: Urology;  Laterality: Bilateral; . CYSTOSCOPY WITH STENT PLACEMENT Right 04/12/2013  Procedure: CYSTOSCOPY WITH STENT PLACEMENT;  Surgeon: Alexis Frock, MD;  Location: [facility];  Service: Urology;  Laterality: Right; . CYSTOSCOPY/RETROGRADE/URETEROSCOPY Bilateral 04/12/2013  Procedure: CYSTOSCOPY/RETROGRADE/URETEROSCOPY;  Surgeon: Alexis Frock, MD;  Location: [facility];  Service: Urology;  Laterality: Bilateral;  RIGHT RETROGRADE  . HOLMIUM LASER APPLICATION Left [id number]  Procedure: HOLMIUM LASER APPLICATION;  Surgeon: Alexis Frock, MD;  Location: [facility];  Service: Urology;  Laterality: Left; . LUMBAR LAMINECTOMY/DECOMPRESSION MICRODISCECTOMY N/A 04/20/2019  Procedure: Lumbar microdisectomy and decompression L5-S1 left;  Surgeon: Latanya Maudlin, MD;  Location: [facility];  Service: Orthopedics;  Laterality: N/A;  4min . LYMPH NODE DISSECTION  ~ 1960  groin . stress cardiolite  09/06/2002 .  TONSILLECTOMY   . VITRECTOMY Bilateral  HPI: 45 y o male with hx of allergic rhinitis, GERD, and asthma referred for OP MBS.  Recent admission 10/26/19 with asthma exacerbation.  PCCM determined symptom resolution was likely delayed by reflux and associated cough causing worsening upper airway irritation.  Suspected element of LPR.  Esophagram 4/14 revealed frank tracheal aspiration of thick barium during the pharyngeal phase. Relevant hx also includes ACDF C4-6 2017. MBS 01/2017 revealed impaired timing of laryngeal closure with subsequent silent aspiration, impaired epiglottic inversion, vallecular residue.  Today, pt reports intermittent difficulty with swallowing over the years, but particularly around time of recent hospitalization.  He participated in swallowing therapy several years ago to assist with airway protection.   Subjective: alert, good historian Assessment / Plan / Recommendation CHL IP CLINICAL IMPRESSIONS 11/08/2019 Clinical Impression Pt presents with a pharyngeal dysphagia due to incomplete epiglottic inversion over the laryngeal vestibule as well as impaired timing of laryngeal vestibule closure (LVC).  There was intermittent, trace aspiration before the  swallow, generally sensed by the pt and eliciting a cough response.  Presence of ADCF hardware may contribute to impaired epiglottic closure, but timing issues are more difficult to explain.  Pt attempted multiple strategies to improve airway protection, including head turns, tilts, and chin tuck in an effort to manipulate swallow physiology - none proved effective.  A mendelsohn maneuver - in which pt elevates larynx and maintains it in that position to lengthen duration of LVC - led to decreased quantity of aspiration.  Results were discussed with pt in real time, and fluoroscopy provided feedback so that pt could modify strategies in real time.  He agreed that OP SLP may provide some benefit in trialing other strategies (to work on timing, increasing duration of LVC).  Coughing/throat-clearing appears to be exacerbating an already irritated larynx.  Recommend continuing current diet; avoid mixed consistencies (cereals, soups with solids and broths); attempt mendelsohn maneuver during initial ten swallows of liquid with each meal; attempt brief oral hold prior to swallow.  Rec OP SLP.  Pt agrees with plan.  SLP Visit Diagnosis Dysphagia, pharyngeal phase (R13.13) Attention and concentration deficit following -- Frontal lobe and executive function deficit following -- Impact on safety and function Mild aspiration risk   CHL IP TREATMENT RECOMMENDATION 11/01/2019 Treatment Recommendations Defer until completion of intrumental exam   Prognosis 11/01/2019 Prognosis for Safe Diet Advancement Good Barriers to Reach Goals -- Barriers/Prognosis Comment -- CHL IP DIET RECOMMENDATION 11/08/2019 SLP Diet Recommendations Regular solids;Thin liquid Liquid Administration via Cup;Straw Medication Administration Whole meds with puree Compensations -- Postural Changes --   CHL IP OTHER RECOMMENDATIONS 11/08/2019 Recommended Consults -- Oral Care Recommendations Oral care BID Other Recommendations --   CHL IP FOLLOW UP RECOMMENDATIONS  11/08/2019 Follow up Recommendations Outpatient SLP   No flowsheet data found.     CHL IP ORAL PHASE 11/08/2019 Oral Phase WFL Oral - Pudding Teaspoon -- Oral - Pudding Cup -- Oral - Honey Teaspoon -- Oral - Honey Cup -- Oral - Nectar Teaspoon -- Oral - Nectar Cup -- Oral - Nectar Straw -- Oral - Thin Teaspoon -- Oral - Thin Cup -- Oral - Thin Straw -- Oral - Puree -- Oral - Mech Soft -- Oral - Regular -- Oral - Multi-Consistency -- Oral - Pill -- Oral Phase - Comment --  CHL IP PHARYNGEAL PHASE 11/08/2019 Pharyngeal Phase Impaired Pharyngeal- Pudding Teaspoon -- Pharyngeal -- Pharyngeal- Pudding Cup -- Pharyngeal -- Pharyngeal- Honey Teaspoon -- Pharyngeal --  Pharyngeal- Honey Cup -- Pharyngeal -- Pharyngeal- Nectar Teaspoon -- Pharyngeal -- Pharyngeal- Nectar Cup Delayed swallow initiation-vallecula;Reduced epiglottic inversion Pharyngeal -- Pharyngeal- Nectar Straw -- Pharyngeal -- Pharyngeal- Thin Teaspoon -- Pharyngeal -- Pharyngeal- Thin Cup Reduced epiglottic inversion;Reduced airway/laryngeal closure;Penetration/Aspiration before swallow;Trace aspiration Pharyngeal Material enters airway, passes BELOW cords and not ejected out despite cough attempt by patient Pharyngeal- Thin Straw Reduced epiglottic inversion;Reduced airway/laryngeal closure;Penetration/Aspiration before swallow;Trace aspiration Pharyngeal Material enters airway, passes BELOW cords and not ejected out despite cough attempt by patient Pharyngeal- Puree Delayed swallow initiation-vallecula;Reduced epiglottic inversion Pharyngeal -- Pharyngeal- Mechanical Soft -- Pharyngeal -- Pharyngeal- Regular -- Pharyngeal -- Pharyngeal- Multi-consistency -- Pharyngeal -- Pharyngeal- Pill -- Pharyngeal -- Pharyngeal Comment --  CHL IP CERVICAL ESOPHAGEAL PHASE 11/08/2019 Cervical Esophageal Phase WFL Pudding Teaspoon -- Pudding Cup -- Honey Teaspoon -- Honey Cup -- Nectar Teaspoon -- Nectar Cup -- Nectar Straw -- Thin Teaspoon -- Thin Cup -- Thin Straw --  Puree -- Mechanical Soft -- Regular -- Multi-consistency -- Pill -- Cervical Esophageal Comment -- Juan Quam Laurice 11/08/2019, 1:51 PM              Microbiology: Recent Results (from the past 240 hour(s))  SARS Coronavirus 2 by RT PCR (hospital order, performed in [facility] hospital lab) Nasopharyngeal Nasopharyngeal Swab     Status: None   Collection Time: 12/01/19 12:20 AM   Specimen: Nasopharyngeal Swab  Result Value Ref Range Status   SARS Coronavirus 2 NEGATIVE NEGATIVE Final    Comment: (NOTE) SARS-CoV-2 target nucleic acids are NOT DETECTED. The SARS-CoV-2 RNA is generally detectable in upper and lower respiratory specimens during the acute phase of infection. The lowest concentration of SARS-CoV-2 viral copies this assay can detect is 250 copies / mL. A negative result does not preclude SARS-CoV-2 infection and should not be used as the sole basis for treatment or other patient management decisions.  A negative result may occur with improper specimen collection / handling, submission of specimen other than nasopharyngeal swab, presence of viral mutation(s) within the areas targeted by this assay, and inadequate number of viral copies (<250 copies / mL). A negative result must be combined with clinical observations, patient history, and epidemiological information. Fact Sheet for Patients:   StrictlyIdeas.no Fact Sheet for Healthcare Providers: BankingDealers.co.za This test is not yet approved or cleared  by the [place] FDA and has been authorized for detection and/or diagnosis of SARS-CoV-2 by FDA under an Emergency Use Authorization (EUA).  This EUA will remain in effect (meaning this test can be used) for the duration of the COVID-19 declaration under Section 564(b)(1) of the Act, 21 U.S.C. section 360bbb-3(b)(1), unless the authorization is terminated or revoked sooner. Performed at [facility], [place] [address] [place], [place] [address]     Labs: Basic Metabolic Panel: Recent Labs  Lab 11/30/19 2313 12/01/19 0518 12/02/19 0912  NA 133* 133* 136  K 4.2 4.1 4.3  CL 100 102 108  CO2 25 22 18*  GLUCOSE 142* 122* 126*  BUN 18 18 19   CREATININE 0.90 0.79 0.80  CALCIUM 8.9 8.7* 8.0*   Liver Function Tests: Recent Labs  Lab 12/01/19 0518  AST 12*  ALT 14  ALKPHOS 74  BILITOT 1.1  PROT 6.3*  ALBUMIN 3.4*   No results for input(s): LIPASE, AMYLASE in the last 168 hours. No results for input(s): AMMONIA in the last 168 hours. CBC: Recent Labs  Lab 11/30/19 2313 12/01/19 0518 12/02/19 0912  WBC 8.8  8.2 4.0  NEUTROABS  --  6.7  --   HGB 15.4 13.5 12.8*  HCT 44.2 38.0* 35.9*  MCV 92.7 91.6 89.1  PLT 258 286 244   Cardiac Enzymes: No results for input(s): CKTOTAL, CKMB, CKMBINDEX, TROPONINI in the last 168 hours. BNP: BNP (last 3 results) Recent Labs    06/28/19 1030 06/30/19 0619  BNP 46.6 48.7   ProBNP (last 3 results) No results for input(s): PROBNP in the last 8760 hours.  CBG: Recent Labs  Lab 12/02/19 0029 12/02/19 0434 12/02/19 0750 12/02/19 0818 12/02/19 1145  GLUCAP 158* 106* 50* 102* 115*   Time spent: 35 minutes were spent in preparing this discharge including medication reconciliation, counseling, and coordination of care.  Signed:  Elleanor Guyett Marry Guan, MD  [facility] 12/02/2019, 12:02 PM

## 2019-12-03 LAB — LEGIONELLA PNEUMOPHILA SEROGP 1 UR AG: L. pneumophila Serogp 1 Ur Ag: NEGATIVE

## 2019-12-03 MED FILL — KETOROLAC 10 MG TABLET: 10 | 5 days supply | Qty: 20 | Fill #0

## 2019-12-06 ENCOUNTER — Other Ambulatory Visit: Payer: Self-pay

## 2019-12-06 ENCOUNTER — Ambulatory Visit: Payer: 59 | Admitting: Pulmonary Disease

## 2019-12-06 ENCOUNTER — Encounter: Payer: Self-pay | Admitting: Pulmonary Disease

## 2019-12-06 DIAGNOSIS — I2584 Coronary atherosclerosis due to calcified coronary lesion: Secondary | ICD-10-CM | POA: Diagnosis not present

## 2019-12-06 DIAGNOSIS — J189 Pneumonia, unspecified organism: Secondary | ICD-10-CM

## 2019-12-06 DIAGNOSIS — J690 Pneumonitis due to inhalation of food and vomit: Secondary | ICD-10-CM

## 2019-12-06 DIAGNOSIS — I2510 Atherosclerotic heart disease of native coronary artery without angina pectoris: Secondary | ICD-10-CM | POA: Diagnosis not present

## 2019-12-06 DIAGNOSIS — J471 Bronchiectasis with (acute) exacerbation: Secondary | ICD-10-CM | POA: Diagnosis not present

## 2019-12-06 MED ORDER — SODIUM CHLORIDE 3 % IN NEBU
INHALATION_SOLUTION | Freq: Two times a day (BID) | RESPIRATORY_TRACT | 3 refills | Status: DC
Start: 2019-12-06 — End: 2022-01-14

## 2019-12-06 NOTE — Assessment & Plan Note (Signed)
Referral to speech therapy -[facility] clinic [phone]  -ask for [facility] if this is in someway related to his C-spine repair

## 2019-12-06 NOTE — Assessment & Plan Note (Signed)
Cardiology consultation due to extensive calcification noted including left main. He will likely need stress stratification with a stress test

## 2019-12-06 NOTE — Patient Instructions (Addendum)
You have bronchiectasis in both lower lobes of your lungs due to repeated aspiration episodes and recurrent pneumonia   Referral to speech therapy -[facility] clinic [phone]  -ask for Garald Balding  Prescription for hypertonic saline nebs twice daily for 2 weeks  Take Mucinex once daily.  Stay on [facility] and albuterol Complete course of Levaquin  Continue to use incentive spirometry for lung expansion  Follow-up in 1 month with chest x-ray  Cardiology consultation for calcification noted in coronary arteries

## 2019-12-06 NOTE — Assessment & Plan Note (Addendum)
Main issue appears to be recurrent aspiration causing bronchiectasis.  HRCT does not show any evidence of ILD or other etiology of bronchiectasis such as MAC.  Fleeting infiltrates in comparison to prior CT also suggests recurrent aspiration as the main etiology here Prescription for hypertonic saline nebs twice daily for 2 weeks  Take Mucinex once daily.  Stay on [facility] and albuterol Complete course of Levaquin

## 2019-12-06 NOTE — Progress Notes (Signed)
Subjective:    Patient ID: James [name], male    DOB: [birth year], 64 y.o.   MRN: [id number]  HPI  64 yo  With IDDM on insulin pump for follow-up of chronic asthmatic bronchitis -now found to have bronchiectasis and recurrent aspiration pneumonia  Hequit smoking 03/2015  He had volume loss in his left lung on imaging in 2015 which appeared improved on a follow-up CT in 2017 and may have been due to rib fractures He underwent C-spine surgery 05/2016 and was noted to have moderate aspiration riskona swallow study 01/2017  PMhx >> HLD, Anxiety/depression, Spinal stenosis, CAD,IDDMwith retinopathy on insulin pump, CKD  I have reviewed his extensive course over the past few months, several discharge summaries and history corroborated by his wife and speaking to pulmonary APP  06/2019 hospitalized for 10 days for asthma exacerbation treated with prednisone taper and Levaquin.  10/2019 admitted for asthma exacerbation, esophagram showed pharyngeal dysphagia with aspiration of barium outpatient swallow evaluation  5/9 fall with extensive left chest pain, feels like rib fractures. 5/14 hospitalized for left lower lobe pneumonia noted on HRCT which is ordered as outpatient  Speech evaluation showed dysphagia , correspondence with speech therapist reviewed and they suggested head CT to rule out neurological causes of dysphagia, head CT negative  Significant tests/ events reviewed Swallow eval >>pharyngeal dysphagia due to incomplete epiglottic inversion over the laryngeal vestibule as well as impaired timing of laryngeal vestibule closure ,Presence of ADCF hardware may contribute .A mendelsohn maneuver - in which pt elevates larynx and maintains it in that position to lengthen duration of LVC - led to decreased quantity of aspiration  HRCT chest 11/2019 bilateral lower lobe bronchiectasis, no ILD, New airspace consolidation in the left lower lobe, Tracheobronchomalacia.  Aortic  atherosclerosis  Esophagram 10/31/19 >> Frank tracheal aspiration of thick barium during the pharyngeal phase of swallowing, causing cough response ,Widely patent distal esophageal mucosal ring  Spirometry 2012 >> FEV1 1.94 (52%) PFTs 05/2015 ratio 74, FEV1 72%, no BD response, mild restriction  CT sinus 08/2013 neg  Ct chest 01/2014 - left volume loss and consolidation left lower lobe-rib fractures CT chest 2017-improved aeration left lung,?  Mild bronchiectasis  Past Medical History:  Diagnosis Date  . ANXIETY 04/03/2007  . Anxiety   . ASTHMA 09/06/2008  . Asthma   . ASTHMATIC BRONCHITIS, ACUTE 10/25/2008  . Bladder neck obstruction   . CARPAL TUNNEL SYNDROME, BILATERAL 07/31/2007   issues resolved, no surgery  . Cervical disc disease   . Chronic bronchitis ([place])    "get it about q yr" (02/12/2014)  . COPD (chronic obstructive pulmonary disease) ([place])   . CORONARY ARTERY DISEASE 04/03/2007  . DEPRESSION 04/03/2007  . Depression   . DIABETES MELLITUS, TYPE I 04/03/2007  . Diabetic retinopathy associated with diabetes mellitus due to underlying condition ([place]) 04/03/2007  . DM W/EYE MANIFESTATIONS, TYPE I, UNCONTROLLED 04/04/2007  . 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 ED (erectile dysfunction)   . History of kidney stones   . HYPERLIPIDEMIA 04/04/2007  . Pneumonia    "several times and again today" (02/13/2104)  . Renal insufficiency   . Seizures ([place])    "insulin seizure from time to time; none in the last couple years" (02/12/2014)  . Spinal stenosis      Review of Systems neg for any significant sore throat, dysphagia, itching, sneezing, nasal congestion or excess/ purulent secretions, fever, chills, sweats, unintended wt loss, pleuritic or exertional cp, hempoptysis, orthopnea pnd  or change in chronic leg swelling. Also denies presyncope, palpitations, heartburn, abdominal pain, nausea, vomiting, diarrhea or change in bowel or urinary habits, dysuria,hematuria,  rash, arthralgias, visual complaints, headache, numbness weakness or ataxia.     Objective:   Physical Exam  Gen. Pleasant, well-nourished, in no distress, normal affect ENT - no pallor,icterus, no post nasal drip Neck: No JVD, no thyromegaly, no carotid bruits Lungs: no use of accessory muscles, no dullness to percussion, bibasal dry rales 1/3 no rhonchi  Cardiovascular: Rhythm regular, heart sounds  normal, no murmurs or gallops, no peripheral edema Abdomen: soft and non-tender, no hepatosplenomegaly, BS normal. Musculoskeletal: No deformities, no cyanosis or clubbing Neuro:  alert, non focal       Assessment & Plan:

## 2019-12-06 NOTE — Assessment & Plan Note (Signed)
Favor aspiration rather than community-acquired, with course of Levaquin, no cultures available and now he only has a dry cough Concern for resistant organisms, if infiltrate persists may eventually need bronchoscopy

## 2019-12-07 MED FILL — SODIUM CHLORIDE 3 % NEBU: 3 | 30 days supply | Qty: 240 | Fill #0

## 2019-12-14 MED FILL — SIMVASTATIN 40 MG TABLET: 40 | 30 days supply | Qty: 30 | Fill #2

## 2019-12-18 ENCOUNTER — Telehealth: Payer: Self-pay | Admitting: Pulmonary Disease

## 2019-12-18 MED ORDER — KETOROLAC TROMETHAMINE 10 MG PO TABS: 10.0000 mg | ORAL_TABLET | Freq: Four times a day (QID) | ORAL | 1 refills | Status: DC | PRN

## 2019-12-18 MED FILL — KETOROLAC 10 MG TABLET: 10 | 5 days supply | Qty: 20 | Fill #0

## 2019-12-18 NOTE — Telephone Encounter (Signed)
Spoke with patient. He is requesting a refill on ketorolac 10mg . The original prescription was written on 12/02/19 by a hospital Dr. Hollice Gong for 20 tablets. Instructions to take 1 tablet by mouth every 6 hours as needed for moderate pain.   Pharmacy is [facility]   RA, please advise if you ok with this refill.

## 2019-12-18 NOTE — Telephone Encounter (Signed)
OK to refill x 1 

## 2019-12-18 NOTE — Telephone Encounter (Signed)
OK for refill x1 Further refills from PCP please

## 2019-12-18 NOTE — Telephone Encounter (Signed)
Spoke with the pt to verify pharm  Rx called in to [facility] out patient pharm

## 2019-12-18 NOTE — Telephone Encounter (Signed)
Spoke with the pt  He is asking for refill on toradol  This was prescribed by Dr Marry Guan in the hospital for chest wall pain  He states having pain from bruised rib and ribs are still bothering him, although it is improving some since the last visit with Dr Elsworth Soho on 12/06/19  The toradol last dispensed on 12/02/19 10 mg #20 1 every 6 hours as needed  Please advise, thanks!

## 2019-12-19 MED FILL — SHINGRIX 50 MCG SUS: 50 | 1 days supply | Qty: 1 | Fill #1

## 2020-01-01 ENCOUNTER — Ambulatory Visit (HOSPITAL_COMMUNITY): Payer: 59

## 2020-01-01 MED FILL — HumaLOG 100 UNIT/ML SOLN: 100 | 90 days supply | Qty: 90 | Fill #1

## 2020-01-04 ENCOUNTER — Other Ambulatory Visit (HOSPITAL_COMMUNITY): Payer: Self-pay | Admitting: Endocrinology

## 2020-01-04 MED FILL — CONTOUR NEXT STRIPS: 75 days supply | Qty: 600 | Fill #0

## 2020-01-04 MED FILL — CHANTIX 1 MG CONT MONTH BOX: 1 | 28 days supply | Qty: 56 | Fill #2

## 2020-01-15 MED FILL — DULERA 100 MCG/5 MCG INH: 100-5 | 30 days supply | Qty: 13 | Fill #1

## 2020-01-15 MED FILL — SIMVASTATIN 40 MG TABLET: 40 | 30 days supply | Qty: 30 | Fill #3

## 2020-01-16 ENCOUNTER — Encounter: Payer: Self-pay | Admitting: Pulmonary Disease

## 2020-01-16 ENCOUNTER — Other Ambulatory Visit: Payer: Self-pay

## 2020-01-16 ENCOUNTER — Ambulatory Visit (HOSPITAL_COMMUNITY): Payer: 59

## 2020-01-16 ENCOUNTER — Ambulatory Visit: Payer: 59 | Admitting: Pulmonary Disease

## 2020-01-16 ENCOUNTER — Ambulatory Visit (INDEPENDENT_AMBULATORY_CARE_PROVIDER_SITE_OTHER): Payer: 59

## 2020-01-16 VITALS — BP 112/70 | HR 85 | Temp 98.1°F | Ht 69.0 in | Wt 182.0 lb

## 2020-01-16 DIAGNOSIS — J189 Pneumonia, unspecified organism: Secondary | ICD-10-CM

## 2020-01-16 DIAGNOSIS — I2584 Coronary atherosclerosis due to calcified coronary lesion: Secondary | ICD-10-CM

## 2020-01-16 DIAGNOSIS — R21 Rash and other nonspecific skin eruption: Secondary | ICD-10-CM | POA: Diagnosis not present

## 2020-01-16 DIAGNOSIS — J471 Bronchiectasis with (acute) exacerbation: Secondary | ICD-10-CM | POA: Diagnosis not present

## 2020-01-16 DIAGNOSIS — I2510 Atherosclerotic heart disease of native coronary artery without angina pectoris: Secondary | ICD-10-CM | POA: Diagnosis not present

## 2020-01-16 DIAGNOSIS — J690 Pneumonitis due to inhalation of food and vomit: Secondary | ICD-10-CM | POA: Diagnosis not present

## 2020-01-16 NOTE — Progress Notes (Signed)
   Subjective:    Patient ID: James [name], male    DOB: [birth year], 64 y.o.   MRN: [id number]  HPI  64 yo With IDDM on insulin pump for follow-up of chronic asthmatic bronchitis -now found to have bronchiectasis and recurrent aspiration pneumonia  Hequit smoking 03/2015 He underwent C-spine surgery 05/2016 and was noted to have moderate aspiration riskona swallow study 01/2017  PMhx >> HLD, Anxiety/depression, Spinal stenosis, CAD,IDDMwith retinopathy on insulin pump, CKD   06/2019 hospitalized for 10 days for asthma exacerbation treated with prednisone taper and Levaquin.  10/2019 admitted for asthma exacerbation, esophagram showed pharyngeal dysphagia with aspiration of barium outpatient swallow evaluation  5/9 fall with extensive left chest pain, feels like rib fractures. 5/14 hospitalized for left lower lobe pneumonia noted on HRCT which is ordered as outpatient  Speech evaluation showed dysphagia   Chief Complaint  Patient presents with  . Follow-up    Sinuses and headache with chills. Coughing up green sputum. Denies any SOB.   Overall has improved from his recent acute illness.  Does not have persistent swallowing issues. Chest pain has resolved. Reports sinus headache for which he took ibuprofen and Sudafed, reports chills , no fever.  Minimal yellow sputum x1 Does not feel like he needs an antibiotic yet. No wheezing or shortness of breath  Sugars are well controlled  Did not get cardiology appointment  Significant tests/ events reviewed Swallow eval >>pharyngeal dysphagia due to incomplete epiglottic inversion over the laryngeal vestibule as well as impaired timing of laryngeal vestibule closure ,Presence of ADCF hardware may contribute .A mendelsohn maneuver - in which pt elevates larynx and maintains it in that position to lengthen duration of LVC - led to decreased quantity of aspiration  HRCT chest 11/2019 bilateral lower lobe bronchiectasis, no ILD,  New airspace consolidation in the left lower lobe, Tracheobronchomalacia.  Aortic atherosclerosis  Esophagram 10/31/19 >> Frank tracheal aspiration of thick barium during the pharyngeal phase of swallowing, causing cough response ,Widely patent distal esophageal mucosal ring  Spirometry 2012 >> FEV1 1.94 (52%) PFTs 05/2015 ratio 74, FEV1 72%, no BD response, mild restriction  CT sinus 08/2013 neg  Ct chest 01/2014 - left volume lossand consolidation left lower lobe-rib fractures CT chest 2017-improved aeration left lung,? Mild bronchiectasis  Review of Systems neg for any significant sore throat, dysphagia, itching, sneezing, nasal congestion or excess/ purulent secretions, fever, chills, sweats, unintended wt loss, pleuritic or exertional cp, hempoptysis, orthopnea pnd or change in chronic leg swelling. Also denies presyncope, palpitations, heartburn, abdominal pain, nausea, vomiting, diarrhea or change in bowel or urinary habits, dysuria,hematuria, rash, arthralgias, visual complaints, headache, numbness weakness or ataxia.     Objective:   Physical Exam  Gen. Pleasant, well-nourished, in no distress ENT - no thrush, no pallor/icterus,no post nasal drip Neck: No JVD, no thyromegaly, no carotid bruits Lungs: no use of accessory muscles, no dullness to percussion, left basal rales no rhonchi  Cardiovascular: Rhythm regular, heart sounds  normal, no murmurs or gallops, no peripheral edema Musculoskeletal: No deformities, no cyanosis or clubbing        Assessment & Plan:

## 2020-01-16 NOTE — Assessment & Plan Note (Addendum)
Chest x-ray to follow-up on pneumonia >> reviewed, appears much improved, left lower lobe infiltrate has resolved  Swallowing precautions as discussed -Small bites -Chin tuck maneuver

## 2020-01-16 NOTE — Assessment & Plan Note (Signed)
Stay on [facility]  If persistent yellow sputum, obtain sputum culture and call us for antibiotic Emphasized airway clearance measures such as hypertonic saline and Mucinex especially when he has sputum production. If he does need an antibiotic may need Augmentin or quinolone given his past history

## 2020-01-16 NOTE — Patient Instructions (Signed)
Chest x-ray to follow-up on pneumonia. Cardiology consultation to follow-up on calcification noted on CT scans  Swallowing precautions as discussed -Small bites -Chin tuck maneuver  Stay on [facility]  If persistent yellow sputum, obtain sputum culture and call us for antibiotic

## 2020-01-16 NOTE — Assessment & Plan Note (Signed)
Recommend cardiology consultation given calcification in all arteries including left main Diabetic, may need further risk stratification by stress test

## 2020-01-22 DIAGNOSIS — Z794 Long term (current) use of insulin: Secondary | ICD-10-CM | POA: Diagnosis not present

## 2020-01-22 DIAGNOSIS — E1065 Type 1 diabetes mellitus with hyperglycemia: Secondary | ICD-10-CM | POA: Diagnosis not present

## 2020-01-22 DIAGNOSIS — Z4681 Encounter for fitting and adjustment of insulin pump: Secondary | ICD-10-CM | POA: Diagnosis not present

## 2020-01-22 DIAGNOSIS — N08 Glomerular disorders in diseases classified elsewhere: Secondary | ICD-10-CM | POA: Diagnosis not present

## 2020-01-22 DIAGNOSIS — E1029 Type 1 diabetes mellitus with other diabetic kidney complication: Secondary | ICD-10-CM | POA: Diagnosis not present

## 2020-01-22 DIAGNOSIS — E7849 Other hyperlipidemia: Secondary | ICD-10-CM | POA: Diagnosis not present

## 2020-01-28 ENCOUNTER — Other Ambulatory Visit (HOSPITAL_COMMUNITY): Payer: Self-pay | Admitting: Endocrinology

## 2020-01-28 DIAGNOSIS — M25561 Pain in right knee: Secondary | ICD-10-CM | POA: Diagnosis not present

## 2020-01-28 MED FILL — LEVOTHYROXINE SODIUM 100 MC: 100 | 90 days supply | Qty: 90 | Fill #0

## 2020-02-08 DIAGNOSIS — E1065 Type 1 diabetes mellitus with hyperglycemia: Secondary | ICD-10-CM | POA: Diagnosis not present

## 2020-02-08 DIAGNOSIS — E1029 Type 1 diabetes mellitus with other diabetic kidney complication: Secondary | ICD-10-CM | POA: Diagnosis not present

## 2020-02-11 MED FILL — KETOROLAC 10 MG TABLET: 10 | 5 days supply | Qty: 20 | Fill #1

## 2020-02-15 MED FILL — APO-VARENICLINE 1 MG TABS: 1 | 28 days supply | Qty: 56 | Fill #3

## 2020-02-22 ENCOUNTER — Other Ambulatory Visit (HOSPITAL_COMMUNITY): Payer: Self-pay | Admitting: Endocrinology

## 2020-02-22 MED FILL — SIMVASTATIN 40 MG TABLET: 40 | 30 days supply | Qty: 30 | Fill #0

## 2020-02-23 MED FILL — traZODone HCL 150 MG TABS: 150 | 30 days supply | Qty: 30 | Fill #0

## 2020-02-26 ENCOUNTER — Other Ambulatory Visit (HOSPITAL_COMMUNITY): Payer: Self-pay | Admitting: Endocrinology

## 2020-03-04 MED FILL — traZODone HCL 150 MG TABS: 150 | 30 days supply | Qty: 30 | Fill #0

## 2020-03-25 MED FILL — SIMVASTATIN 40 MG TABLET: 40 | 30 days supply | Qty: 30 | Fill #1

## 2020-03-26 DIAGNOSIS — L989 Disorder of the skin and subcutaneous tissue, unspecified: Secondary | ICD-10-CM | POA: Diagnosis not present

## 2020-03-26 DIAGNOSIS — E038 Other specified hypothyroidism: Secondary | ICD-10-CM | POA: Diagnosis not present

## 2020-03-26 DIAGNOSIS — Z794 Long term (current) use of insulin: Secondary | ICD-10-CM | POA: Diagnosis not present

## 2020-03-26 DIAGNOSIS — E103593 Type 1 diabetes mellitus with proliferative diabetic retinopathy without macular edema, bilateral: Secondary | ICD-10-CM | POA: Diagnosis not present

## 2020-03-26 DIAGNOSIS — I700 Atherosclerosis of aorta: Secondary | ICD-10-CM | POA: Diagnosis not present

## 2020-03-26 DIAGNOSIS — J441 Chronic obstructive pulmonary disease with (acute) exacerbation: Secondary | ICD-10-CM | POA: Diagnosis not present

## 2020-03-26 DIAGNOSIS — J690 Pneumonitis due to inhalation of food and vomit: Secondary | ICD-10-CM | POA: Diagnosis not present

## 2020-03-26 DIAGNOSIS — N401 Enlarged prostate with lower urinary tract symptoms: Secondary | ICD-10-CM | POA: Diagnosis not present

## 2020-03-26 DIAGNOSIS — I2510 Atherosclerotic heart disease of native coronary artery without angina pectoris: Secondary | ICD-10-CM | POA: Diagnosis not present

## 2020-03-26 DIAGNOSIS — E785 Hyperlipidemia, unspecified: Secondary | ICD-10-CM | POA: Diagnosis not present

## 2020-03-26 DIAGNOSIS — F419 Anxiety disorder, unspecified: Secondary | ICD-10-CM | POA: Diagnosis not present

## 2020-04-02 ENCOUNTER — Telehealth: Payer: Self-pay | Admitting: Primary Care

## 2020-04-03 NOTE — Telephone Encounter (Signed)
Called and spoke with pt letting him know the info stated by Beth and he verbalized understanding. Nothing further needed. 

## 2020-04-03 NOTE — Telephone Encounter (Signed)
Called pt but unable to reach. Left message for him to return call. °

## 2020-04-03 NOTE — Telephone Encounter (Signed)
We are not currently prescribing chantix. Recommend NRT such at patch or gum

## 2020-04-03 NOTE — Telephone Encounter (Signed)
Pt returning phone call. Pt can be reached at [phone]

## 2020-04-03 NOTE — Telephone Encounter (Signed)
Spoke with patient regarding prior message.Patient stated he would like refills of his Chantix 1mg  . Patient's insurance will not approve no more refill's patient has used all refill's for the year. Patient stated he needs to stay smoke free.    Beth can you please advise  Thank you

## 2020-04-04 ENCOUNTER — Other Ambulatory Visit (HOSPITAL_COMMUNITY): Payer: Self-pay | Admitting: Endocrinology

## 2020-04-04 ENCOUNTER — Encounter: Payer: Self-pay | Admitting: *Deleted

## 2020-04-04 NOTE — Telephone Encounter (Signed)
Tried to initiate a prior authorization via cover my meds to also see if I could submit the information from the letter from Dr. Elsworth Soho in but when hit send to plan to see if the questions would come up and also the place to be able to submit the letter in, nothing came up for me to be able to submit letter.  Response that came up stated: This drug/product may not be covered under the pharmacy benefit. Prior Authorization is not available.  Will need to call [employer] to obtain fax number for prior authorization appeals. Will hold encounter open. Letter has also been printed and is also in triage. Will follow up on Monday 9/20.

## 2020-04-04 NOTE — Telephone Encounter (Signed)
Dr. Elsworth Soho, please see mychart message sent by pt's wife and advise.  Hi Dr. Elsworth Soho,  This is [name] Hult (wife) sending this message at [place] request. He is extremely frustrated and upset with his experience with the staff in your office. As you know, he has struggled with smoking cessation for years and his addiction to nicotine is very strong.  The only thing that has helped him is Chantix. When he is taking this medication, he does not smoke - although he still has the desire to from time to time. With this medication he has been smoke-free since December of 2020! The problem now is that our insurance [facility]) is denying to cover the next refill (originally prescribed by [name]) because he has exhausted the allowable time of 180 days per year. He was told by a pharmacist Aaron Edelman) @ [facility] that insurance may authorize covering additional refills if his doctor provides information to them that it is a medical necessity. I am requesting this assistance from you and/or your clinical staff. [place] had contacted the office regarding this request and spoke with a staff member who works with [name] since she was the prescriber. Either that person did not understand what he was asking for and needed or they just flat-out did not want to help him. The response he was given was that "she does not prescribe that medication anymore". Then he was told to "just use patches or gum". I am appalled! Why would it be acceptable to re-introduce nicotine to his body when he has been free of it for 9 months? The issue is not that he wants a new Rx refill because he has 3 remaining refills on the original Rx. The problem is obtaining coverage from insurance. Please help with this matter or offer some other viable solution - not nicotine patches or gum. (Sadly -Wellbutrin is not effective for him) I absolutely CANNOT have him return to smoking which is exactly what will happen - it has happened before  when he could not receive the Chantix. *As a [place] myself, I realize this message will most likely be viewed initially by a clinical staff member. If so, I am begging you to speak directly with Dr. Elsworth Soho (only him, not a different provider) about this message or forward it to him. Either way, [place] will be expecting a response from someone.  Thank you so much!   [name] Demirjian    Pt called the office yesterday 04/03/20 requesting refill of the Chantix and Beth's response to pt was that we are currently not prescribing chantix and recommended he get NRT such as patch or gum.

## 2020-04-04 NOTE — Telephone Encounter (Signed)
Unfortunately Chantix, as you correctly mention, is only provided 180 days / 6 months.  The intention is that the patient should be completely off cigarettes and should be able to come off Chantix in that timeframe.  But I recognize James [name]'s unique situation. We will try to write a letter to insurance  Please open up a prior authorization. Letter can be formulated as followsCorene Cornea [name] is under my care for chronic asthmatic bronchitis with recurrent exacerbations. Chantix has really helped him to quit smoking and he has been smoke-free since December 2020!  Unfortunately nicotine supplements and Wellbutrin has not worked as well for him. I understand that his prescription for Chantix has been denied since he is exhausted the allowable time upon 80 days/year Please assist him with 3 more refills for Chantix .  It is crucial for his health that he remains smoke-free.  This would also prevent recurrent healthcare utilization/hospitalizations

## 2020-04-07 NOTE — Telephone Encounter (Signed)
I have faxed letter to prior authorization fax number and also fax number for reconsideration and appeals. Will await response.

## 2020-04-16 ENCOUNTER — Ambulatory Visit: Payer: 59 | Admitting: Cardiology

## 2020-04-16 ENCOUNTER — Other Ambulatory Visit: Payer: Self-pay

## 2020-04-16 ENCOUNTER — Encounter: Payer: Self-pay | Admitting: Cardiology

## 2020-04-16 VITALS — BP 142/75 | HR 70 | Ht 69.0 in | Wt 183.0 lb

## 2020-04-16 DIAGNOSIS — I2089 Other forms of angina pectoris: Secondary | ICD-10-CM

## 2020-04-16 DIAGNOSIS — I2584 Coronary atherosclerosis due to calcified coronary lesion: Secondary | ICD-10-CM

## 2020-04-16 DIAGNOSIS — I2510 Atherosclerotic heart disease of native coronary artery without angina pectoris: Secondary | ICD-10-CM

## 2020-04-16 DIAGNOSIS — I208 Other forms of angina pectoris: Secondary | ICD-10-CM

## 2020-04-16 DIAGNOSIS — R0609 Other forms of dyspnea: Secondary | ICD-10-CM

## 2020-04-16 DIAGNOSIS — Z87891 Personal history of nicotine dependence: Secondary | ICD-10-CM | POA: Diagnosis not present

## 2020-04-16 DIAGNOSIS — E782 Mixed hyperlipidemia: Secondary | ICD-10-CM | POA: Diagnosis not present

## 2020-04-16 DIAGNOSIS — Z794 Long term (current) use of insulin: Secondary | ICD-10-CM | POA: Diagnosis not present

## 2020-04-16 DIAGNOSIS — E1059 Type 1 diabetes mellitus with other circulatory complications: Secondary | ICD-10-CM

## 2020-04-16 DIAGNOSIS — I700 Atherosclerosis of aorta: Secondary | ICD-10-CM

## 2020-04-16 DIAGNOSIS — R0989 Other specified symptoms and signs involving the circulatory and respiratory systems: Secondary | ICD-10-CM

## 2020-04-16 MED ORDER — ASPIRIN EC 81 MG PO TBEC: 81.0000 mg | DELAYED_RELEASE_TABLET | Freq: Every day | ORAL | 11 refills | Status: AC

## 2020-04-16 NOTE — Progress Notes (Signed)
Date:  04/16/2020   ID:  James [name], DOB [birth year], MRN [id number]  PCP:  [name], MD  Cardiologist:  Rex Kras, DO, [facility] (established care 04/16/2020)  REASON FOR CONSULT: Coronary artery calcification and atherosclerosis of the aorta.  REQUESTING PHYSICIAN:  [name], [address],  [place] [address]  Chief Complaint  Patient presents with  . Coronary Artery Disease  . New Patient (Initial Visit)    HPI  James [name] is a 64 y.o. male who presents to the office with a chief complaint of "calcium in the heart arteries." Patient's past medical history and cardiovascular risk factors include: Insulin-dependent diabetes mellitus type 1, hypothyroidism, former smoker (40-year pack history of smoking), hyperlipidemia,  coronary artery calcification, aortic atherosclerosis.   He is referred to the office at the request of [name], MD for evaluation of coronary artery calcification atherosclerosis of the aorta.  Patient states that he had a CTA of the chest back in May 2021 was found to have incidental coronary artery calcification and multiple coronary arteries.  He is referred to cardiology for further evaluation and management.  Patient denies any active chest pain at rest or with effort related activities.  He does have dyspnea on exertion which is chronic and stable.  He has been attributing this to deconditioning as he has not been working out due to COVID-19 pandemic and 40-year pack history of smoking.  Patient has successfully stopped smoking for 1 year now.  Patient states that recently started to bike on a regular basis and is able to do it for less than a mile each time.  He is limited by effort related dyspnea.  He had a left heart catheterization back in 2004 results which are noted below.  His last stress test was approximately 30 years ago according to the patient.  Denies prior history of myocardial infarction, congestive heart failure,  deep venous thrombosis, pulmonary embolism, stroke, transient ischemic attack.  FUNCTIONAL STATUS: Started riding his bike regularly less than 1 mile per [place].    ALLERGIES: Allergies  Allergen Reactions  . Augmentin [Amoxicillin-Pot Clavulanate] Nausea And Vomiting and Other (See Comments)    Did it involve swelling of the face/tongue/throat, SOB, or low BP? No Did it involve sudden or severe rash/hives, skin peeling, or any reaction on the inside of your mouth or nose? No Did you need to seek medical attention at a hospital or doctor's office? No When did it last happen?5+ years If all above answers are "NO", may proceed with cephalosporin use.   [name] [Escitalopram Oxalate] Itching    MEDICATION LIST PRIOR TO VISIT: Current Meds  Medication Sig  . acetaminophen (TYLENOL) 500 MG tablet Take 1,000 mg by mouth every 6 (six) hours as needed for mild pain or headache.  . albuterol (PROVENTIL) (2.5 MG/3ML) 0.083% nebulizer solution USE 1 VIAL VIA NEBULIZER EVERY 6 HOURS AS NEEDED FOR WHEEZING OR SHORTNESS OF BREATH (Patient taking differently: Take 2.5 mg by nebulization every 6 (six) hours as needed for wheezing or shortness of breath. )  . albuterol (VENTOLIN HFA) 108 (90 Base) MCG/ACT inhaler INHALE 2 PUFFS BY MOUTH INTO THE LUNGS EVERY 6 HOURS AS NEEDED FOR WHEEZING OR SHORTNESS OF BREATH. (Patient taking differently: Inhale 2 puffs into the lungs every 6 (six) hours as needed for wheezing or shortness of breath. )  . finasteride (PROSCAR) 5 MG tablet Take 5 mg by mouth daily.  . insulin lispro (HUMALOG) 100 UNIT/ML KwikPen  Junior Inject 0.05 mLs (5 Units total) into the skin 3 (three) times daily before meals.  . insulin lispro (HUMALOG) 100 UNIT/ML KwikPen Junior Sliding scale: CBG 70 - 120: 0 units  CBG 121 - 150: 2 units  CBG 151 - 200: 3 units  CBG 201 - 250: 5 units  CBG 251 - 300: 8 units  CBG 301 - 350: 11 units  CBG 351 - 400: 15 units  . Insulin Pen Needle 31G X  5 MM MISC 1 Device by Does not apply route 4 (four) times daily. For use with insulin pens  . mometasone-formoterol (DULERA) 100-5 MCG/ACT AERO Inhale 2 puffs into the lungs in the morning and at bedtime.  . polyvinyl alcohol (LIQUIFILM TEARS) 1.4 % ophthalmic solution Place 1 drop into both eyes as needed for dry eyes.  . promethazine (PHENERGAN) 25 MG tablet Take 25 mg by mouth every 8 (eight) hours as needed for nausea or vomiting.   . simvastatin (ZOCOR) 40 MG tablet Take 40 mg by mouth at bedtime.   . sodium chloride HYPERTONIC 3 % nebulizer solution Take by nebulization 2 (two) times daily.  . traZODone (DESYREL) 150 MG tablet Take 150 mg by mouth at bedtime.     PAST MEDICAL HISTORY: Past Medical History:  Diagnosis Date  . ANXIETY 04/03/2007  . Anxiety   . ASTHMA 09/06/2008  . Asthma   . ASTHMATIC BRONCHITIS, ACUTE 10/25/2008  . Bladder neck obstruction   . CARPAL TUNNEL SYNDROME, BILATERAL 07/31/2007   issues resolved, no surgery  . Cervical disc disease   . Chronic bronchitis ([place])    "get it about q yr" (02/12/2014)  . COPD (chronic obstructive pulmonary disease) ([place])   . CORONARY ARTERY DISEASE 04/03/2007  . DEPRESSION 04/03/2007  . Depression   . DIABETES MELLITUS, TYPE I 04/03/2007  . Diabetic retinopathy associated with diabetes mellitus due to underlying condition ([employer]) 04/03/2007  . DM W/EYE MANIFESTATIONS, TYPE I, UNCONTROLLED 04/04/2007  . 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 ED (erectile dysfunction)   . History of kidney stones   . HYPERLIPIDEMIA 04/04/2007  . Pneumonia    "several times and again today" (02/13/2104)  . Renal insufficiency   . Seizures ([place])    "insulin seizure from time to time; none in the last couple years" (02/12/2014)  . Spinal stenosis     PAST SURGICAL HISTORY: Past Surgical History:  Procedure Laterality Date  . ANTERIOR CERVICAL DECOMP/DISCECTOMY FUSION  2000   "couple screws and a plate"  . ANTERIOR CERVICAL  DECOMP/DISCECTOMY FUSION N/A 06/24/2016   Procedure: ANTERIOR CERVICAL DECOMPRESSION/DISCECTOMY FUSION CERVICAL FOUR - CERVICAL FIVE, CERVICAL FIVE - CERVICAL SIX; REMOVAL TETHER CERVICAL PLATE;  Surgeon: Jovita Gamma, MD;  Location: [facility];  Service: Neurosurgery;  Laterality: N/A;  ANTERIOR CERVICAL DECOMPRESSION/DISCECTOMY FUSION CERVICAL FOUR - CERVICAL FIVE, CERVICAL FIVE - CERVICAL SIX; REMOVAL TETHER CERVICAL PLATE  . APPENDECTOMY    . BACK SURGERY    . CARDIAC CATHETERIZATION  1990's  . CATARACT EXTRACTION W/ INTRAOCULAR LENS  IMPLANT, BILATERAL Bilateral   . CYSTOSCOPY WITH RETROGRADE PYELOGRAM, URETEROSCOPY AND STENT PLACEMENT Bilateral 04/06/2013   Procedure: BILATERAL CYSTOSCOPY WITH RETROGRADE PYELOGRAMS, STENT PLACEMENTS AND LEFT URETEROSCOPY AND STONE REMOVAL;  Surgeon: Alexis Frock, MD;  Location: [facility];  Service: Urology;  Laterality: Bilateral;  . CYSTOSCOPY WITH STENT PLACEMENT Right 04/12/2013   Procedure: CYSTOSCOPY WITH STENT PLACEMENT;  Surgeon: Alexis Frock, MD;  Location: [facility];  Service: Urology;  Laterality: Right;  .  CYSTOSCOPY/RETROGRADE/URETEROSCOPY Bilateral 04/12/2013   Procedure: CYSTOSCOPY/RETROGRADE/URETEROSCOPY;  Surgeon: Alexis Frock, MD;  Location: [facility];  Service: Urology;  Laterality: Bilateral;  RIGHT RETROGRADE   . HOLMIUM LASER APPLICATION Left 1/88/4166   Procedure: HOLMIUM LASER APPLICATION;  Surgeon: Alexis Frock, MD;  Location: [facility];  Service: Urology;  Laterality: Left;  . LUMBAR LAMINECTOMY/DECOMPRESSION MICRODISCECTOMY N/A 04/20/2019   Procedure: Lumbar microdisectomy and decompression L5-S1 left;  Surgeon: Latanya Maudlin, MD;  Location: [facility];  Service: Orthopedics;  Laterality: N/A;  33min  . LYMPH NODE DISSECTION  ~ 1960   groin  . stress cardiolite  09/06/2002  . TONSILLECTOMY    . VITRECTOMY Bilateral     FAMILY HISTORY: The patient family history includes Cancer in his mother; Diabetes in his brother; Heart disease in his  brother; Stroke in his father.  SOCIAL HISTORY:  The patient  reports that he quit smoking about 7 months ago. His smoking use included cigarettes. He smoked 0.00 packs per [place]. He has never used smokeless tobacco. He reports that he does not drink alcohol and does not use drugs.  REVIEW OF SYSTEMS: Review of Systems  Constitutional: Negative for chills and fever.  HENT: Negative for hoarse voice and nosebleeds.   Eyes: Negative for discharge, double vision and pain.  Cardiovascular: Positive for dyspnea on exertion. Negative for chest pain, claudication, leg swelling, near-syncope, orthopnea, palpitations, paroxysmal nocturnal dyspnea and syncope.  Respiratory: Negative for hemoptysis and shortness of breath.   Musculoskeletal: Negative for muscle cramps and myalgias.  Gastrointestinal: Negative for abdominal pain, constipation, diarrhea, hematemesis, hematochezia, melena, nausea and vomiting.  Neurological: Negative for dizziness and light-headedness.    PHYSICAL EXAM: Vitals with BMI 04/16/2020 01/16/2020 12/06/2019  Height 5\' 9"  5\' 9"  5\' 9"   Weight 183 lbs 182 lbs 182 lbs 3 oz  BMI 27.01 06.30 16.01  Systolic 093 235 573  Diastolic 75 70 66  Pulse 70 85 89    CONSTITUTIONAL: Well-developed and well-nourished. No acute distress.  SKIN: Skin is warm and dry. No rash noted. No cyanosis. No pallor. No jaundice HEAD: Normocephalic and atraumatic.  EYES: No scleral icterus MOUTH/THROAT: Moist oral membranes.  NECK: No JVD present. No thyromegaly noted. Left carotid bruit.  LYMPHATIC: No visible cervical adenopathy.  CHEST Normal respiratory effort. No intercostal retractions  LUNGS: Clear to auscultation bilaterally. No stridor. No wheezes. No rales.  CARDIOVASCULAR: Regular rate and rhythm, positive S1-S2, no murmurs rubs or gallops appreciated. ABDOMINAL: No apparent ascites.  EXTREMITIES: No peripheral edema, decreased DP and PT bilaterally.  HEMATOLOGIC: No significant  bruising NEUROLOGIC: Oriented to person, place, and time. Nonfocal. Normal muscle tone.  PSYCHIATRIC: Normal mood and affect. Normal behavior. Cooperative  CARDIAC DATABASE: EKG: 04/16/2020: Normal sinus rhythm, 67 bpm, normal axis, without underlying ischemia or injury pattern.    Echocardiogram: No results found for this or any previous visit from the past 1095 days.   Stress Testing: No results found for this or any previous visit from the past 1095 days.   Heart Catheterization:  2004:  1. The left main coronary artery: The left main coronary artery was free o f significant disease.. 2. Left anterior descending: The left anterior descending artery gave ris to two septal perforators and two diagonal branches. There was some irregularity in the LAD but no significant obstruction.  3. Circumflex artery: The circumflex artery was a large dominant vesse that gave rise to two marginal branches, two posterolateral branches, and a posterior descending branch. There was some irregularity in  the AV portion of the circumflex artery but no significant obstruction.  4. Right coronary artery: The right coronary was a small nondominant vessel. LEFT VENTRICULOGRAM: The left ventriculogram performed in the RAO projection showed good wall motion with no areas of hypokinesis. The estimated ejection fraction was 60%.  LABORATORY DATA: CBC Latest Ref Rng & Units 12/02/2019 12/01/2019 11/30/2019  WBC 4.0 - 10.5 K/uL 4.0 8.2 8.8  Hemoglobin 13.0 - 17.0 g/dL 12.8(L) 13.5 15.4  Hematocrit 39 - 52 % 35.9(L) 38.0(L) 44.2  Platelets 150 - 400 K/uL 244 286 258    CMP Latest Ref Rng & Units 12/02/2019 12/01/2019 11/30/2019  Glucose 70 - 99 mg/dL 126(H) 122(H) 142(H)  BUN 8 - 23 mg/dL $Remove'19 18 18  'SjrUERL$ Creatinine 0.61 - 1.24 mg/dL 0.80 0.79 0.90  Sodium 135 - 145 mmol/L 136 133(L) 133(L)  Potassium 3.5 - 5.1 mmol/L 4.3 4.1 4.2  Chloride 98 - 111 mmol/L 108 102 100  CO2 22 - 32 mmol/L 18(L) 22 25  Calcium 8.9 - 10.3  mg/dL 8.0(L) 8.7(L) 8.9  Total Protein 6.5 - 8.1 g/dL - 6.3(L) -  Total Bilirubin 0.3 - 1.2 mg/dL - 1.1 -  Alkaline Phos 38 - 126 U/L - 74 -  AST 15 - 41 U/L - 12(L) -  ALT 0 - 44 U/L - 14 -    Lipid Panel     Component Value Date/Time   CHOL 124 05/08/2010 1434   TRIG 38.0 05/08/2010 1434   HDL 54.20 05/08/2010 1434   CHOLHDL 2 05/08/2010 1434   VLDL 7.6 05/08/2010 1434   LDLCALC 62 05/08/2010 1434    No components found for: NTPROBNP No results for input(s): PROBNP in the last 8760 hours. No results for input(s): TSH in the last 8760 hours.  BMP Recent Labs    11/30/19 2313 12/01/19 0518 12/02/19 0912  NA 133* 133* 136  K 4.2 4.1 4.3  CL 100 102 108  CO2 25 22 18*  GLUCOSE 142* 122* 126*  BUN $Re'18 18 19  'hQd$ CREATININE 0.90 0.79 0.80  CALCIUM 8.9 8.7* 8.0*  GFRNONAA >60 >60 >60  GFRAA >60 >60 >60    HEMOGLOBIN A1C Lab Results  Component Value Date   HGBA1C 6.5 (H) 10/26/2019   MPG 140 10/26/2019   External Labs: Collected: 01/23/2020 Creatinine 1 mg/dL. eGFR: 75 mL/min per 1.73 m Potassium 4.6 TSH: 4.92 and free T4 110 ng/dL   IMPRESSION:    ICD-10-CM   1. Coronary atherosclerosis due to calcified coronary lesion of native artery  I25.10 EKG 12-Lead   I25.84 aspirin EC 81 MG tablet    CBC    BASIC METABOLIC PANEL WITH GFR    SARS-COV-2 RNA,(COVID-19) QUAL NAAT  2. 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 Dyspnea on exertion  R06.00 PCV ECHOCARDIOGRAM COMPLETE    CBC    BASIC METABOLIC PANEL WITH GFR    SARS-COV-2 RNA,(COVID-19) QUAL NAAT  4. Atherosclerosis of aorta (HCC)  I70.0   5. Type 1 diabetes mellitus with other circulatory complication (HCC)  O16.07   6. Long-term insulin use (HCC)  Z79.4   7. Former smoker  Z87.891   86. Mixed hyperlipidemia  E78.2   9. Bruit of left carotid artery  R09.89 PCV CAROTID DUPLEX (BILATERAL)     RECOMMENDATIONS: James [name] is a 64 y.o. male whose past medical history and cardiac risk factors include:  Insulin-dependent diabetes mellitus type 1, hypothyroidism, former smoker (40-year pack history of smoking), hyperlipidemia,  coronary artery calcification, aortic atherosclerosis.   Coronary atherosclerosis due to calcified coronary arteries:  Start aspirin 81 mg p.o. daily.  Continue statin therapy.  I independently reviewed the CT images with the patient and showed him the calcified plaque which appears to be present in the distal left main, LAD and circumflex distribution.   Echocardiogram will be ordered to evaluate for structural heart disease and left ventricular systolic function.  Dyspnea on exertion/anginal equivalent:  Patient has been experiencing dyspnea on exertion which is chronic and stable.  This may be his anginal equivalent as it limits his physical activities on a daily basis.  I did educate the patient that he has multiple other cardiovascular risk factors including insulin-dependent diabetes mellitus type 1, 40-year pack history of smoking, advanced age, and coronary calcification and aortic atherosclerosis and therefore an ischemic evaluation is warranted.  Given the extensive degree of coronary calcification and insulin-dependent diabetes recommended left heart catheterization with possible intervention to evaluate for obstructive CAD.  Alternatives that were discussed with the patient including coronary stress test.  Shared decision was to proceed with left heart catheterization with possible intervention but he would like to discuss this further with his wife.  The procedure of left heart catheterization with possible intervention was explained to the patient in detail.   The indication, alternatives, risks and benefits were reviewed.   Complications include but not limited to bleeding, infection, vascular injury, stroke, myocardial infection, arrhythmia, kidney injury which may require hemodialysis, radiation-related injury in the case of prolonged fluoroscopy use,  emergency cardiac surgery, and death. The patient understands the risks of serious complication is 1-2 in 1000 with diagnostic cardiac cath and 1-2% or less with angioplasty/stenting. The patient voices understanding and provides verbal feedback and wishes to proceed with coronary angiography with possible PCI.  Carotid bruit: Check carotid duplex.  Continue aspirin and statin therapy  Hyperlipidemia: Continue statin therapy.  Managed by PCP.  Patient is asked to bring his most recent lipid profile at the next office visit.  Benign essential hypertension: Patient's blood pressure at today's office visit was not at goal.  However patient states that his blood pressures at home are well controlled with systolic blood pressures range 120-133.  Low-salt diet recommended.  Currently managed by primary care provider.  Insulin-dependent diabetes mellitus type 1: Currently managed by primary care provider.  Patient denies symptoms of claudication but does have decreased pulses in bilateral lower extremities.  Once ischemic work-up is complete would recommend screening for peripheral artery disease given his multiple cardiovascular risk factors.  Patient verbalized understanding and is agreeable with the plan of care.  FINAL MEDICATION LIST END OF ENCOUNTER: Meds ordered this encounter  Medications  . aspirin EC 81 MG tablet    Sig: Take 1 tablet (81 mg total) by mouth daily. Swallow whole.    Dispense:  30 tablet    Refill:  11     Current Outpatient Medications:  .  acetaminophen (TYLENOL) 500 MG tablet, Take 1,000 mg by mouth every 6 (six) hours as needed for mild pain or headache., Disp: , Rfl:  .  albuterol (PROVENTIL) (2.5 MG/3ML) 0.083% nebulizer solution, USE 1 VIAL VIA NEBULIZER EVERY 6 HOURS AS NEEDED FOR WHEEZING OR SHORTNESS OF BREATH (Patient taking differently: Take 2.5 mg by nebulization every 6 (six) hours as needed for wheezing or shortness of breath. ), Disp: 180 mL, Rfl: 3 .   albuterol (VENTOLIN HFA) 108 (90 Base) MCG/ACT inhaler, INHALE 2 PUFFS BY MOUTH  INTO THE LUNGS EVERY 6 HOURS AS NEEDED FOR WHEEZING OR SHORTNESS OF BREATH. (Patient taking differently: Inhale 2 puffs into the lungs every 6 (six) hours as needed for wheezing or shortness of breath. ), Disp: 18 g, Rfl: 2 .  finasteride (PROSCAR) 5 MG tablet, Take 5 mg by mouth daily., Disp: , Rfl:  .  insulin lispro (HUMALOG) 100 UNIT/ML KwikPen Junior, Inject 0.05 mLs (5 Units total) into the skin 3 (three) times daily before meals., Disp: 3 mL, Rfl: 0 .  insulin lispro (HUMALOG) 100 UNIT/ML KwikPen Junior, Sliding scale: CBG 70 - 120: 0 units  CBG 121 - 150: 2 units  CBG 151 - 200: 3 units  CBG 201 - 250: 5 units  CBG 251 - 300: 8 units  CBG 301 - 350: 11 units  CBG 351 - 400: 15 units, Disp: 3 mL, Rfl: 0 .  Insulin Pen Needle 31G X 5 MM MISC, 1 Device by Does not apply route 4 (four) times daily. For use with insulin pens, Disp: 100 each, Rfl: 0 .  mometasone-formoterol (DULERA) 100-5 MCG/ACT AERO, Inhale 2 puffs into the lungs in the morning and at bedtime., Disp: 13 g, Rfl: 3 .  polyvinyl alcohol (LIQUIFILM TEARS) 1.4 % ophthalmic solution, Place 1 drop into both eyes as needed for dry eyes., Disp: , Rfl:  .  promethazine (PHENERGAN) 25 MG tablet, Take 25 mg by mouth every 8 (eight) hours as needed for nausea or vomiting. , Disp: , Rfl:  .  simvastatin (ZOCOR) 40 MG tablet, Take 40 mg by mouth at bedtime. , Disp: , Rfl: 5 .  sodium chloride HYPERTONIC 3 % nebulizer solution, Take by nebulization 2 (two) times daily., Disp: 240 mL, Rfl: 3 .  traZODone (DESYREL) 150 MG tablet, Take 150 mg by mouth at bedtime., Disp: , Rfl: 5 .  aspirin EC 81 MG tablet, Take 1 tablet (81 mg total) by mouth daily. Swallow whole., Disp: 30 tablet, Rfl: 11  Orders Placed This Encounter  Procedures  . SARS-COV-2 RNA,(COVID-19) QUAL NAAT  . CBC  . BASIC METABOLIC PANEL WITH GFR  . EKG 12-Lead  . PCV ECHOCARDIOGRAM COMPLETE  . PCV  CAROTID DUPLEX (BILATERAL)    There are no Patient Instructions on file for this visit.   --Continue cardiac medications as reconciled in final medication list. --Return in about 4 weeks (around 05/14/2020) for Post heart catheterization. Or sooner if needed. --Continue follow-up with your primary care physician regarding the management of your other chronic comorbid conditions.  Patient's questions and concerns were addressed to his satisfaction. He voices understanding of the instructions provided during this encounter.   This note was created using a voice recognition software as a result there may be grammatical errors inadvertently enclosed that do not reflect the nature of this encounter. Every attempt is made to correct such errors.  Total time spent: 63 minutes.  Independently reviewed the images of the CT scan with patient, reviewed outside records, discussed disease management, obtain consent for left heart catheterization with possible intervention, his questions or concerns addressed to his satisfaction.  Rex Kras, [place], [facility]  Pager: [phone] Office: [phone]

## 2020-04-17 ENCOUNTER — Ambulatory Visit: Payer: 59 | Admitting: Pulmonary Disease

## 2020-04-21 DIAGNOSIS — E1029 Type 1 diabetes mellitus with other diabetic kidney complication: Secondary | ICD-10-CM | POA: Diagnosis not present

## 2020-04-21 DIAGNOSIS — E1065 Type 1 diabetes mellitus with hyperglycemia: Secondary | ICD-10-CM | POA: Diagnosis not present

## 2020-04-22 ENCOUNTER — Other Ambulatory Visit: Payer: Self-pay

## 2020-04-22 DIAGNOSIS — I2584 Coronary atherosclerosis due to calcified coronary lesion: Secondary | ICD-10-CM | POA: Diagnosis not present

## 2020-04-22 DIAGNOSIS — R0609 Other forms of dyspnea: Secondary | ICD-10-CM

## 2020-04-22 DIAGNOSIS — I2510 Atherosclerotic heart disease of native coronary artery without angina pectoris: Secondary | ICD-10-CM | POA: Diagnosis not present

## 2020-04-22 DIAGNOSIS — R0989 Other specified symptoms and signs involving the circulatory and respiratory systems: Secondary | ICD-10-CM

## 2020-04-22 DIAGNOSIS — R0600 Dyspnea, unspecified: Secondary | ICD-10-CM | POA: Diagnosis not present

## 2020-04-23 LAB — CBC
Hematocrit: 46.1 % (ref 37.5–51.0)
Hemoglobin: 15.8 g/dL (ref 13.0–17.7)
MCH: 31 pg (ref 26.6–33.0)
MCHC: 34.3 g/dL (ref 31.5–35.7)
MCV: 91 fL (ref 79–97)
Platelets: 266 10*3/uL (ref 150–450)
RBC: 5.09 x10E6/uL (ref 4.14–5.80)
RDW: 12.7 % (ref 11.6–15.4)
WBC: 4 10*3/uL (ref 3.4–10.8)

## 2020-04-24 ENCOUNTER — Ambulatory Visit: Payer: 59

## 2020-04-24 ENCOUNTER — Other Ambulatory Visit: Payer: Self-pay

## 2020-04-24 DIAGNOSIS — R0989 Other specified symptoms and signs involving the circulatory and respiratory systems: Secondary | ICD-10-CM | POA: Diagnosis not present

## 2020-04-24 DIAGNOSIS — R0609 Other forms of dyspnea: Secondary | ICD-10-CM | POA: Diagnosis not present

## 2020-04-26 ENCOUNTER — Inpatient Hospital Stay (HOSPITAL_COMMUNITY): Admission: RE | Admit: 2020-04-26 | Payer: Self-pay | Source: Ambulatory Visit

## 2020-04-27 ENCOUNTER — Other Ambulatory Visit: Payer: Self-pay | Admitting: Cardiology

## 2020-04-27 DIAGNOSIS — I6522 Occlusion and stenosis of left carotid artery: Secondary | ICD-10-CM

## 2020-04-28 ENCOUNTER — Other Ambulatory Visit (HOSPITAL_COMMUNITY)
Admission: RE | Admit: 2020-04-28 | Discharge: 2020-04-28 | Disposition: A | Discharge status: Home / Self Care | Payer: 59 | Source: Ambulatory Visit | Attending: Cardiology | Admitting: Cardiology

## 2020-04-28 DIAGNOSIS — Z20822 Contact with and (suspected) exposure to covid-19: Secondary | ICD-10-CM | POA: Diagnosis not present

## 2020-04-28 DIAGNOSIS — Z01812 Encounter for preprocedural laboratory examination: Secondary | ICD-10-CM | POA: Diagnosis not present

## 2020-04-28 LAB — SARS CORONAVIRUS 2 (TAT 6-24 HRS): SARS Coronavirus 2: NEGATIVE

## 2020-04-28 MED FILL — SIMVASTATIN 40 MG TABLET: 40 | 30 days supply | Qty: 30 | Fill #2

## 2020-04-28 MED FILL — traZODone HCL 150 MG TABS: 150 | 90 days supply | Qty: 90 | Fill #0

## 2020-04-28 MED FILL — LEVOTHYROXINE SODIUM 100 MC: 100 | 90 days supply | Qty: 90 | Fill #1

## 2020-04-29 ENCOUNTER — Other Ambulatory Visit: Payer: Self-pay

## 2020-04-29 ENCOUNTER — Ambulatory Visit (HOSPITAL_COMMUNITY)
Admission: RE | Admit: 2020-04-29 | Discharge: 2020-04-29 | Disposition: A | Discharge status: Home / Self Care | Payer: 59 | Attending: Cardiology | Admitting: Cardiology

## 2020-04-29 ENCOUNTER — Encounter (HOSPITAL_COMMUNITY)
Admission: RE | Disposition: A | Discharge status: Home / Self Care | Payer: Self-pay | Source: Home / Self Care | Attending: Cardiology

## 2020-04-29 DIAGNOSIS — Z794 Long term (current) use of insulin: Secondary | ICD-10-CM | POA: Diagnosis not present

## 2020-04-29 DIAGNOSIS — E039 Hypothyroidism, unspecified: Secondary | ICD-10-CM | POA: Diagnosis not present

## 2020-04-29 DIAGNOSIS — G5603 Carpal tunnel syndrome, bilateral upper limbs: Secondary | ICD-10-CM | POA: Insufficient documentation

## 2020-04-29 DIAGNOSIS — Z7982 Long term (current) use of aspirin: Secondary | ICD-10-CM | POA: Insufficient documentation

## 2020-04-29 DIAGNOSIS — I10 Essential (primary) hypertension: Secondary | ICD-10-CM | POA: Diagnosis not present

## 2020-04-29 DIAGNOSIS — Z79890 Hormone replacement therapy (postmenopausal): Secondary | ICD-10-CM | POA: Diagnosis not present

## 2020-04-29 DIAGNOSIS — Z79899 Other long term (current) drug therapy: Secondary | ICD-10-CM | POA: Diagnosis not present

## 2020-04-29 DIAGNOSIS — Z833 Family history of diabetes mellitus: Secondary | ICD-10-CM | POA: Diagnosis not present

## 2020-04-29 DIAGNOSIS — J449 Chronic obstructive pulmonary disease, unspecified: Secondary | ICD-10-CM | POA: Diagnosis not present

## 2020-04-29 DIAGNOSIS — Z888 Allergy status to other drugs, medicaments and biological substances status: Secondary | ICD-10-CM | POA: Diagnosis not present

## 2020-04-29 DIAGNOSIS — E785 Hyperlipidemia, unspecified: Secondary | ICD-10-CM | POA: Insufficient documentation

## 2020-04-29 DIAGNOSIS — Z8701 Personal history of pneumonia (recurrent): Secondary | ICD-10-CM | POA: Diagnosis not present

## 2020-04-29 DIAGNOSIS — Z8249 Family history of ischemic heart disease and other diseases of the circulatory system: Secondary | ICD-10-CM | POA: Insufficient documentation

## 2020-04-29 DIAGNOSIS — R0600 Dyspnea, unspecified: Secondary | ICD-10-CM | POA: Diagnosis not present

## 2020-04-29 DIAGNOSIS — Z880 Allergy status to penicillin: Secondary | ICD-10-CM | POA: Insufficient documentation

## 2020-04-29 DIAGNOSIS — I2510 Atherosclerotic heart disease of native coronary artery without angina pectoris: Secondary | ICD-10-CM | POA: Diagnosis present

## 2020-04-29 DIAGNOSIS — R569 Unspecified convulsions: Secondary | ICD-10-CM | POA: Diagnosis not present

## 2020-04-29 DIAGNOSIS — I2584 Coronary atherosclerosis due to calcified coronary lesion: Secondary | ICD-10-CM | POA: Diagnosis not present

## 2020-04-29 DIAGNOSIS — E109 Type 1 diabetes mellitus without complications: Secondary | ICD-10-CM | POA: Insufficient documentation

## 2020-04-29 DIAGNOSIS — Z87891 Personal history of nicotine dependence: Secondary | ICD-10-CM | POA: Insufficient documentation

## 2020-04-29 DIAGNOSIS — E10319 Type 1 diabetes mellitus with unspecified diabetic retinopathy without macular edema: Secondary | ICD-10-CM | POA: Insufficient documentation

## 2020-04-29 HISTORY — PX: LEFT HEART CATH AND CORONARY ANGIOGRAPHY: CATH118249

## 2020-04-29 LAB — GLUCOSE, CAPILLARY
Glucose-Capillary: 109 mg/dL — ABNORMAL HIGH (ref 70–99)
Glucose-Capillary: 111 mg/dL — ABNORMAL HIGH (ref 70–99)
Glucose-Capillary: 117 mg/dL — ABNORMAL HIGH (ref 70–99)

## 2020-04-29 LAB — BASIC METABOLIC PANEL
Anion gap: 8 (ref 5–15)
BUN: 14 mg/dL (ref 8–23)
CO2: 23 mmol/L (ref 22–32)
Calcium: 8.8 mg/dL — ABNORMAL LOW (ref 8.9–10.3)
Chloride: 107 mmol/L (ref 98–111)
Creatinine, Ser: 0.86 mg/dL (ref 0.61–1.24)
GFR, Estimated: >60 mL/min (ref >60)
Glucose, Bld: 114 mg/dL — ABNORMAL HIGH (ref 70–99)
Potassium: 4.5 mmol/L (ref 3.5–5.1)
Sodium: 138 mmol/L (ref 135–145)

## 2020-04-29 LAB — POCT I-STAT, CHEM 8
BUN: 16 mg/dL (ref 8–23)
Calcium, Ion: 1.18 mmol/L (ref 1.15–1.40)
Chloride: 104 mmol/L (ref 98–111)
Creatinine, Ser: 0.8 mg/dL (ref 0.61–1.24)
Glucose, Bld: 115 mg/dL — ABNORMAL HIGH (ref 70–99)
HCT: 42 % (ref 39.0–52.0)
Hemoglobin: 14.3 g/dL (ref 13.0–17.0)
Potassium: 4.3 mmol/L (ref 3.5–5.1)
Sodium: 140 mmol/L (ref 135–145)
TCO2: 23 mmol/L (ref 22–32)

## 2020-04-29 SURGERY — LEFT HEART CATH AND CORONARY ANGIOGRAPHY
Anesthesia: LOCAL

## 2020-04-29 MED ORDER — IOHEXOL 350 MG/ML SOLN
INTRAVENOUS | Status: DC | PRN
  Administered 2020-04-29: 75 mL

## 2020-04-29 MED ORDER — LIDOCAINE HCL (PF) 1 % IJ SOLN
INTRAMUSCULAR | Status: AC
  Filled 2020-04-29: qty 30

## 2020-04-29 MED ORDER — LABETALOL HCL 5 MG/ML IV SOLN: 10.0000 mg | INTRAVENOUS | Status: DC | PRN

## 2020-04-29 MED ORDER — ONDANSETRON HCL 4 MG/2ML IJ SOLN: 4.0000 mg | Freq: Four times a day (QID) | INTRAMUSCULAR | Status: DC | PRN

## 2020-04-29 MED ORDER — ASPIRIN 81 MG PO CHEW
CHEWABLE_TABLET | ORAL | Status: AC
  Filled 2020-04-29: qty 1

## 2020-04-29 MED ORDER — SODIUM CHLORIDE 0.9 % IV SOLN: INTRAVENOUS | Status: DC

## 2020-04-29 MED ORDER — MIDAZOLAM HCL 2 MG/2ML IJ SOLN
INTRAMUSCULAR | Status: AC
  Filled 2020-04-29: qty 2

## 2020-04-29 MED ORDER — LIDOCAINE HCL (PF) 1 % IJ SOLN
INTRAMUSCULAR | Status: DC | PRN
  Administered 2020-04-29: 15 mL
  Administered 2020-04-29: 2 mL

## 2020-04-29 MED ORDER — HYDRALAZINE HCL 20 MG/ML IJ SOLN: 10.0000 mg | INTRAMUSCULAR | Status: DC | PRN

## 2020-04-29 MED ORDER — HEPARIN (PORCINE) IN NACL 1000-0.9 UT/500ML-% IV SOLN
INTRAVENOUS | Status: AC
  Filled 2020-04-29: qty 500

## 2020-04-29 MED ORDER — SODIUM CHLORIDE 0.9 % IV SOLN: 250.0000 mL | INTRAVENOUS | Status: DC | PRN

## 2020-04-29 MED ORDER — VERAPAMIL HCL 2.5 MG/ML IV SOLN
INTRAVENOUS | Status: AC
  Filled 2020-04-29: qty 2

## 2020-04-29 MED ORDER — HEPARIN SODIUM (PORCINE) 1000 UNIT/ML IJ SOLN
INTRAMUSCULAR | Status: AC
  Filled 2020-04-29: qty 1

## 2020-04-29 MED ORDER — SODIUM CHLORIDE 0.9% FLUSH: 3.0000 mL | INTRAVENOUS | Status: DC | PRN

## 2020-04-29 MED ORDER — MIDAZOLAM HCL 2 MG/2ML IJ SOLN
INTRAMUSCULAR | Status: DC | PRN
  Administered 2020-04-29: 1 mg via INTRAVENOUS

## 2020-04-29 MED ORDER — NITROGLYCERIN 1 MG/10 ML FOR IR/CATH LAB
INTRA_ARTERIAL | Status: DC | PRN
  Administered 2020-04-29: 100 ug via INTRACORONARY
  Administered 2020-04-29 (×2): 200 ug via INTRA_ARTERIAL
  Administered 2020-04-29: 100 ug via INTRACORONARY

## 2020-04-29 MED ORDER — SODIUM CHLORIDE 0.9 % WEIGHT BASED INFUSION: 1.0000 mL/kg/h | INTRAVENOUS | Status: DC

## 2020-04-29 MED ORDER — VERAPAMIL HCL 2.5 MG/ML IV SOLN
INTRAVENOUS | Status: DC | PRN
  Administered 2020-04-29: 10 mL via INTRA_ARTERIAL

## 2020-04-29 MED ORDER — NITROGLYCERIN 1 MG/10 ML FOR IR/CATH LAB
INTRA_ARTERIAL | Status: AC
  Filled 2020-04-29: qty 10

## 2020-04-29 MED ORDER — SODIUM CHLORIDE 0.9 % WEIGHT BASED INFUSION
3.0000 mL/kg/h | INTRAVENOUS | Status: AC
  Administered 2020-04-29: 3 mL/kg/h via INTRAVENOUS

## 2020-04-29 MED ORDER — ASPIRIN 81 MG PO CHEW
81.0000 mg | CHEWABLE_TABLET | ORAL | Status: AC
  Administered 2020-04-29: 81 mg via ORAL

## 2020-04-29 MED ORDER — HEPARIN SODIUM (PORCINE) 1000 UNIT/ML IJ SOLN
INTRAMUSCULAR | Status: DC | PRN
  Administered 2020-04-29: 2000 [IU] via INTRAVENOUS

## 2020-04-29 MED ORDER — SODIUM CHLORIDE 0.9% FLUSH: 3.0000 mL | Freq: Two times a day (BID) | INTRAVENOUS | Status: DC

## 2020-04-29 MED ORDER — ACETAMINOPHEN 325 MG PO TABS: 650.0000 mg | ORAL_TABLET | ORAL | Status: DC | PRN

## 2020-04-29 MED ORDER — FENTANYL CITRATE (PF) 100 MCG/2ML IJ SOLN
INTRAMUSCULAR | Status: AC
  Filled 2020-04-29: qty 2

## 2020-04-29 MED ORDER — FENTANYL CITRATE (PF) 100 MCG/2ML IJ SOLN
INTRAMUSCULAR | Status: DC | PRN
Start: 2020-04-29 — End: 2020-04-29
  Administered 2020-04-29: 50 ug via INTRAVENOUS

## 2020-04-29 MED ORDER — HEPARIN (PORCINE) IN NACL 1000-0.9 UT/500ML-% IV SOLN
INTRAVENOUS | Status: DC | PRN
  Administered 2020-04-29 (×2): 500 mL

## 2020-04-29 SURGICAL SUPPLY — 15 items
CATH INFINITI 5FR MULTPACK ANG (CATHETERS) ×1 IMPLANT
CATH OPTITORQUE TIG 4.0 5F (CATHETERS) ×1 IMPLANT
CLOSURE MYNX CONTROL 5F (Vascular Products) ×1 IMPLANT
DEVICE RAD COMP TR BAND LRG (VASCULAR PRODUCTS) ×1 IMPLANT
GLIDESHEATH SLEND A-KIT 6F 22G (SHEATH) ×1 IMPLANT
GUIDEWIRE INQWIRE 1.5J.035X260 (WIRE) IMPLANT
INQWIRE 1.5J .035X260CM (WIRE) ×2
KIT HEART LEFT (KITS) ×2 IMPLANT
KIT MICROPUNCTURE NIT STIFF (SHEATH) ×1 IMPLANT
PACK CARDIAC CATHETERIZATION (CUSTOM PROCEDURE TRAY) ×2 IMPLANT
SHEATH PINNACLE 5F 10CM (SHEATH) ×1 IMPLANT
SHEATH PROBE COVER 6X72 (BAG) ×1 IMPLANT
TRANSDUCER W/STOPCOCK (MISCELLANEOUS) ×2 IMPLANT
TUBING CIL FLEX 10 FLL-RA (TUBING) ×2 IMPLANT
WIRE HI TORQ VERSACORE-J 145CM (WIRE) ×1 IMPLANT

## 2020-04-29 NOTE — Interval H&P Note (Signed)
History and Physical Interval Note:  04/29/2020 2:19 PM  James [name]  has presented today for surgery, with the diagnosis of chest pain.  The various methods of treatment have been discussed with the patient and family. After consideration of risks, benefits and other options for treatment, the patient has consented to  Procedure(s): LEFT HEART CATH AND CORONARY ANGIOGRAPHY (N/A) as a surgical intervention.  The patient's history has been reviewed, patient examined, no change in status, stable for surgery.  I have reviewed the patient's chart and labs.  Questions were answered to the patient's satisfaction.    2016/2017 Appropriate Use Criteria for Coronary Revascularization Clinical Presentation: Diabetes Mellitus? Symptom Status? S/P CABG? Antianginal Therapy (# of long-acting drugs)? Results of Non-invasive testing? FFR/iFR results in all diseased vessels? Patient undergoing renal transplant? Patient undergoing percutaneous valve procedure (TAVR, MitraClip, Others)? Symptom Status:  Ischemic Symptoms  Non-invasive Testing:  High risk  If no or indeterminate stress test, FFR/iFR results in all diseased vessels:  N/A  Diabetes Mellitus:  Yes  S/P CABG:  No  Antianginal therapy (number of long-acting drugs):  0  Patient undergoing renal transplant:  No  Patient undergoing percutaneous valve procedure:  No    newline 1 Vessel Disease PCI CABG  No proximal LAD involvement, No proximal left dominant LCX involvement M (5); Indication 2 M (4); Indication 2   Proximal left dominant LCX involvement M (6); Indication 5 M (6); Indication 5   Proximal LAD involvement M (6); Indication 5 M (6); Indication 5   newline 2 Vessel Disease  No proximal LAD involvement M (6); Indication 8 M (5); Indication 8   Proximal LAD involvement M (6); Indication 14 A (7); Indication 14   newline 3 Vessel Disease  Low disease complexity (e.g., focal stenoses, SYNTAX <=22) M (6); Indication 19 A (8); Indication  19   Intermediate or high disease complexity (e.g., SYNTAX >=23) M (5); Indication 23 A (8); Indication 23   newline Left Main Disease  Isolated LMCA disease: ostial or midshaft A (7); Indication 24 A (8); Indication 24   Isolated LMCA disease: bifurcation involvement M (5); Indication 25 A (8); Indication 25   LMCA ostial or midshaft, concurrent low disease burden multivessel disease (e.g., 1-2 additional focal stenoses, SYNTAX <=22) M (6); Indication 26 A (9); Indication 26   LMCA ostial or midshaft, concurrent intermediate or high disease burden multivessel disease (e.g., 1-2 additional bifurcation stenoses, long stenoses, SYNTAX >=23) M (4); Indication 27 A (9); Indication 27   LMCA bifurcation involvement, concurrent low disease burden multivessel disease (e.g., 1-2 additional focal stenoses, SYNTAX <=22) M (5); Indication 28 A (8); Indication 28   LMCA bifurcation involvement, concurrent intermediate or high disease burden multivessel disease (e.g., 1-2 additional bifurcation stenoses, long stenoses, SYNTAX >=23) R (3); Indication 29 A (9); Indication [place]

## 2020-04-29 NOTE — Discharge Instructions (Signed)
Drink plenty of fluids for 48 hours and keep wrist elevated at heart level for 24 hours  Radial Site Care   This sheet gives you information about how to care for yourself after your procedure. Your health care provider may also give you more specific instructions. If you have problems or questions, contact your health care provider. What can I expect after the procedure? After the procedure, it is common to have:  Bruising and tenderness at the catheter insertion area. Follow these instructions at home: Medicines  Take over-the-counter and prescription medicines only as told by your health care provider. Insertion site care 1. Follow instructions from your health care provider about how to take care of your insertion site. Make sure you: ? Wash your hands with soap and water before you change your bandage (dressing). If soap and water are not available, use hand sanitizer. ? Remove your dressing as told by your health care provider. In 24 hours 2. Check your insertion site every [place] for signs of infection. Check for: ? Redness, swelling, or pain. ? Fluid or blood. ? Pus or a bad smell. ? Warmth. 3. Do not take baths, swim, or use a hot tub until your health care provider approves. 4. You may shower 24-48 hours after the procedure, or as directed by your health care provider. ? Remove the dressing and gently wash the site with plain soap and water. ? Pat the area dry with a clean towel. ? Do not rub the site. That could cause bleeding. 5. Do not apply powder or lotion to the site. Activity   1. For 24 hours after the procedure, or as directed by your health care provider: ? Do not flex or bend the affected arm. ? Do not push or pull heavy objects with the affected arm. ? Do not drive yourself home from the hospital or clinic. You may drive 24 hours after the procedure unless your health care provider tells you not to. ? Do not operate machinery or power tools. 2. Do not lift  anything that is heavier than 10 lb (4.5 kg), or the limit that you are told, until your health care provider says that it is safe.  For 4 days 3. Ask your health care provider when it is okay to: ? Return to work or school. ? Resume usual physical activities or sports. ? Resume sexual activity. General instructions  If the catheter site starts to bleed, raise your arm and put firm pressure on the site. If the bleeding does not stop, get help right away. This is a medical emergency.  If you went home on the same [place] as your procedure, a responsible adult should be with you for the first 24 hours after you arrive home.  Keep all follow-up visits as told by your health care provider. This is important. Contact a health care provider if:  You have a fever.  You have redness, swelling, or yellow drainage around your insertion site. Get help right away if:  You have unusual pain at the radial site.  The catheter insertion area swells very fast.  The insertion area is bleeding, and the bleeding does not stop when you hold steady pressure on the area.  Your arm or hand becomes pale, cool, tingly, or numb. These symptoms may represent a serious problem that is an emergency. Do not wait to see if the symptoms will go away. Get medical help right away. Call your local emergency services (911 in the [place] Do   not drive yourself to the hospital. Summary  After the procedure, it is common to have bruising and tenderness at the site.  Follow instructions from your health care provider about how to take care of your radial site wound. Check the wound every [place] for signs of infection.  Do not lift anything that is heavier than 10 lb (4.5 kg), or the limit that you are told, until your health care provider says that it is safe. This information is not intended to replace advice given to you by your health care provider. Make sure you discuss any questions you have with your health care  provider. Document Revised: 08/10/2017 Document Reviewed: 08/10/2017 [employer] Patient Education  2020 [employer]  Femoral Site Care This sheet gives you information about how to care for yourself after your procedure. Your health care provider may also give you more specific instructions. If you have problems or questions, contact your health care provider. What can I expect after the procedure?  After the procedure, it is common to have:  Bruising that usually fades within 1-2 weeks.  Tenderness at the site. Follow these instructions at home: Wound care 6. Follow instructions from your health care provider about how to take care of your insertion site. Make sure you: ? Wash your hands with soap and water before you change your bandage (dressing). If soap and water are not available, use hand sanitizer. ? Remove your dressing as told by your health care provider. In 24 hours 7. Do not take baths, swim, or use a hot tub until your health care provider approves. 8. You may shower 24-48 hours after the procedure or as told by your health care provider. ? Gently wash the site with plain soap and water. ? Pat the area dry with a clean towel. ? Do not rub the site. This may cause bleeding. 9. Do not apply powder or lotion to the site. Keep the site clean and dry. 10. Check your femoral site every [place] for signs of infection. Check for: ? Redness, swelling, or pain. ? Fluid or blood. ? Warmth. ? Pus or a bad smell. Activity 4. For the first 2-3 days after your procedure, or as long as directed: ? Avoid climbing stairs as much as possible. ? Do not squat. 5. Do not lift anything that is heavier than 10 lb (4.5 kg), or the limit that you are told, until your health care provider says that it is safe. For 5 days 6. Rest as directed. ? Avoid sitting for a long time without moving. Get up to take short walks every 1-2 hours. 7. Do not drive for 24 hours if you were given a medicine to help  you relax (sedative). General instructions  Take over-the-counter and prescription medicines only as told by your health care provider.  Keep all follow-up visits as told by your health care provider. This is important. Contact a health care provider if you have:  A fever or chills.  You have redness, swelling, or pain around your insertion site. Get help right away if:  The catheter insertion area swells very fast.  You pass out.  You suddenly start to sweat or your skin gets clammy.  The catheter insertion area is bleeding, and the bleeding does not stop when you hold steady pressure on the area.  The area near or just beyond the catheter insertion site becomes pale, cool, tingly, or numb. These symptoms may represent a serious problem that is an emergency. Do not   wait to see if the symptoms will go away. Get medical help right away. Call your local emergency services (911 in the [place] Do not drive yourself to the hospital. Summary  After the procedure, it is common to have bruising that usually fades within 1-2 weeks.  Check your femoral site every [place] for signs of infection.  Do not lift anything that is heavier than 10 lb (4.5 kg), or the limit that you are told, until your health care provider says that it is safe. This information is not intended to replace advice given to you by your health care provider. Make sure you discuss any questions you have with your health care provider. Document Revised: 07/18/2017 Document Reviewed: 07/18/2017 [employer] Patient Education  2020 [employer]   

## 2020-04-29 NOTE — H&P (Signed)
OV 04/22/2020 copied for documentation    Date:  04/29/2020   ID:  James [name], DOB 01-May-1956, MRN [id number]  PCP:  James Bowen, MD  Cardiologist:  James Mormon, DO, [place] (established care 04/16/2020)  REASON FOR CONSULT: Coronary artery calcification and atherosclerosis of the aorta.  REQUESTING PHYSICIAN:  No referring provider defined for this encounter.  No chief complaint on file.   HPI  James [name] is a 64 y.o. male who presents to the office with a chief complaint of "calcium in the heart arteries." Patient's past medical history and cardiovascular risk factors include: Insulin-dependent diabetes mellitus type 1, hypothyroidism, former smoker (40-year pack history of smoking), hyperlipidemia,  coronary artery calcification, aortic atherosclerosis.   He is referred to the office at the request of No ref. provider found for evaluation of coronary artery calcification atherosclerosis of the aorta.  Patient states that he had a CTA of the chest back in May 2021 was found to have incidental coronary artery calcification and multiple coronary arteries.  He is referred to cardiology for further evaluation and management.  Patient denies any active chest pain at rest or with effort related activities.  He does have dyspnea on exertion which is chronic and stable.  He has been attributing this to deconditioning as he has not been working out due to COVID-19 pandemic and 40-year pack history of smoking.  Patient has successfully stopped smoking for 1 year now.  Patient states that recently started to bike on a regular basis and is able to do it for less than a mile each time.  He is limited by effort related dyspnea.  He had a left heart catheterization back in 2004 results which are noted below.  His last stress test was approximately 30 years ago according to the patient.  Denies prior history of myocardial infarction, congestive heart failure, deep venous thrombosis,  pulmonary embolism, stroke, transient ischemic attack.  FUNCTIONAL STATUS: Started riding his bike regularly less than 1 mile per [place].    ALLERGIES: Allergies  Allergen Reactions   Augmentin [Amoxicillin-Pot Clavulanate] Nausea And Vomiting and Other (See Comments)    Did it involve swelling of the face/tongue/throat, SOB, or low BP? No Did it involve sudden or severe rash/hives, skin peeling, or any reaction on the inside of your mouth or nose? No Did you need to seek medical attention at a hospital or doctor's office? No When did it last happen?5+ years If all above answers are NO, may proceed with cephalosporin use.    Lexapro [Escitalopram Oxalate] Itching    MEDICATION LIST PRIOR TO VISIT: Current Meds  Medication Sig   acetaminophen (TYLENOL) 500 MG tablet Take 1,000 mg by mouth every 6 (six) hours as needed for mild pain or headache.   aspirin EC 81 MG tablet Take 1 tablet (81 mg total) by mouth daily. Swallow whole. (Patient taking differently: Take 81 mg by mouth at bedtime. Swallow whole.)   finasteride (PROPECIA) 1 MG tablet Take 1 mg by mouth every evening.   ibuprofen (ADVIL) 200 MG tablet Take 800 mg by mouth every 8 (eight) hours as needed (pain.).   insulin lispro (HUMALOG) 100 UNIT/ML KwikPen Junior Inject 0.05 mLs (5 Units total) into the skin 3 (three) times daily before meals.   levothyroxine (SYNTHROID) 100 MCG tablet Take 100 mcg by mouth every evening.   MINOXIDIL EX Apply 1 application topically daily. For hair loss   mometasone-formoterol (DULERA) 100-5 MCG/ACT AERO Inhale 2 puffs into the  lungs in the morning and at bedtime.   polyvinyl alcohol (LIQUIFILM TEARS) 1.4 % ophthalmic solution Place 1 drop into both eyes as needed for dry eyes.   promethazine (PHENERGAN) 25 MG tablet Take 25 mg by mouth every 8 (eight) hours as needed for nausea or vomiting.    simvastatin (ZOCOR) 40 MG tablet Take 40 mg by mouth at bedtime.    traZODone  (DESYREL) 150 MG tablet Take 150 mg by mouth at bedtime.     PAST MEDICAL HISTORY: Past Medical History:  Diagnosis Date   ANXIETY 04/03/2007   Anxiety    ASTHMA 09/06/2008   Asthma    ASTHMATIC BRONCHITIS, ACUTE 10/25/2008   Bladder neck obstruction    CARPAL TUNNEL SYNDROME, BILATERAL 07/31/2007   issues resolved, no surgery   Cervical disc disease    Chronic bronchitis (James [name])    "get it about q yr" (02/12/2014)   COPD (chronic obstructive pulmonary disease) ([place])    CORONARY ARTERY DISEASE 04/03/2007   DEPRESSION 04/03/2007   Depression    DIABETES MELLITUS, TYPE I 04/03/2007   Diabetic retinopathy associated with diabetes mellitus due to underlying condition (James [name]) 04/03/2007   DM W/EYE MANIFESTATIONS, TYPE I, UNCONTROLLED 04/04/2007   DM W/RENAL MNFST, TYPE I, UNCONTROLLED 04/04/2007   ED (erectile dysfunction)    History of kidney stones    HYPERLIPIDEMIA 04/04/2007   Pneumonia    "several times and again today" (02/13/2104)   Renal insufficiency    Seizures (James [name])    "insulin seizure from time to time; none in the last couple years" (02/12/2014)   Spinal stenosis     PAST SURGICAL HISTORY: Past Surgical History:  Procedure Laterality Date   ANTERIOR CERVICAL DECOMP/DISCECTOMY FUSION  2000   "couple screws and a plate"   ANTERIOR CERVICAL DECOMP/DISCECTOMY FUSION N/A 06/24/2016   Procedure: ANTERIOR CERVICAL DECOMPRESSION/DISCECTOMY FUSION CERVICAL FOUR - CERVICAL FIVE, CERVICAL FIVE - CERVICAL SIX; REMOVAL TETHER CERVICAL PLATE;  Surgeon: James Gamma, MD;  Location: [facility];  Service: Neurosurgery;  Laterality: N/A;  ANTERIOR CERVICAL DECOMPRESSION/DISCECTOMY FUSION CERVICAL FOUR - CERVICAL FIVE, CERVICAL FIVE - CERVICAL SIX; REMOVAL TETHER CERVICAL PLATE   APPENDECTOMY     BACK SURGERY     CARDIAC CATHETERIZATION  1990's   CATARACT EXTRACTION W/ INTRAOCULAR LENS  IMPLANT, BILATERAL Bilateral    CYSTOSCOPY WITH RETROGRADE PYELOGRAM, URETEROSCOPY AND  STENT PLACEMENT Bilateral 04/06/2013   Procedure: BILATERAL CYSTOSCOPY WITH RETROGRADE PYELOGRAMS, STENT PLACEMENTS AND LEFT URETEROSCOPY AND STONE REMOVAL;  Surgeon: James Frock, MD;  Location: [facility];  Service: Urology;  Laterality: Bilateral;   CYSTOSCOPY WITH STENT PLACEMENT Right 04/12/2013   Procedure: CYSTOSCOPY WITH STENT PLACEMENT;  Surgeon: James Frock, MD;  Location: [facility];  Service: Urology;  Laterality: Right;   CYSTOSCOPY/RETROGRADE/URETEROSCOPY Bilateral 04/12/2013   Procedure: CYSTOSCOPY/RETROGRADE/URETEROSCOPY;  Surgeon: James Frock, MD;  Location: [facility];  Service: Urology;  Laterality: Bilateral;  RIGHT RETROGRADE    HOLMIUM LASER APPLICATION Left 1/61/0960   Procedure: HOLMIUM LASER APPLICATION;  Surgeon: James Frock, MD;  Location: [facility];  Service: Urology;  Laterality: Left;   LUMBAR LAMINECTOMY/DECOMPRESSION MICRODISCECTOMY N/A 04/20/2019   Procedure: Lumbar microdisectomy and decompression L5-S1 left;  Surgeon: Latanya Maudlin, MD;  Location: [facility];  Service: Orthopedics;  Laterality: N/A;  3mn   LYMPH NODE DISSECTION  ~ 1960   groin   stress cardiolite  09/06/2002   TONSILLECTOMY     VITRECTOMY Bilateral     FAMILY HISTORY: The patient family history includes Cancer in his mother;  Diabetes in his brother; Heart disease in his brother; Stroke in his father.  SOCIAL HISTORY:  The patient  reports that he quit smoking about 8 months ago. His smoking use included cigarettes. He smoked 0.00 packs per [place]. He has never used smokeless tobacco. He reports that he does not drink alcohol and does not use drugs.  REVIEW OF SYSTEMS: Review of Systems  Constitutional: Negative for chills and fever.  HENT: Negative for hoarse voice and nosebleeds.   Eyes: Negative for discharge, double vision and pain.  Cardiovascular: Positive for dyspnea on exertion. Negative for chest pain, claudication, leg swelling, near-syncope, orthopnea, palpitations, paroxysmal  nocturnal dyspnea and syncope.  Respiratory: Negative for hemoptysis and shortness of breath.   Musculoskeletal: Negative for muscle cramps and myalgias.  Gastrointestinal: Negative for abdominal pain, constipation, diarrhea, hematemesis, hematochezia, melena, nausea and vomiting.  Neurological: Negative for dizziness and light-headedness.    PHYSICAL EXAM: Vitals with BMI 04/29/2020 04/16/2020 01/16/2020  Height 5' 9" 5' 9" 5' 9"  Weight 180 lbs 183 lbs 182 lbs  BMI 26.57 00.86 76.19  Systolic 509 326 712  Diastolic 75 75 70  Pulse 66 70 85    CONSTITUTIONAL: Well-developed and well-nourished. No acute distress.  SKIN: Skin is warm and dry. No rash noted. No cyanosis. No pallor. No jaundice HEAD: Normocephalic and atraumatic.  EYES: No scleral icterus MOUTH/THROAT: Moist oral membranes.  NECK: No JVD present. No thyromegaly noted. Left carotid bruit.  LYMPHATIC: No visible cervical adenopathy.  CHEST Normal respiratory effort. No intercostal retractions  LUNGS: Clear to auscultation bilaterally. No stridor. No wheezes. No rales.  CARDIOVASCULAR: Regular rate and rhythm, positive S1-S2, no murmurs rubs or gallops appreciated. ABDOMINAL: No apparent ascites.  EXTREMITIES: No peripheral edema, decreased DP and PT bilaterally.  HEMATOLOGIC: No significant bruising NEUROLOGIC: Oriented to person, place, and time. Nonfocal. Normal muscle tone.  PSYCHIATRIC: Normal mood and affect. Normal behavior. Cooperative  CARDIAC DATABASE: EKG: 04/16/2020: Normal sinus rhythm, 67 bpm, normal axis, without underlying ischemia or injury pattern.    Echocardiogram: No results found for this or any previous visit from the past 1095 days.   Stress Testing: No results found for this or any previous visit from the past 1095 days.   Heart Catheterization:  2004:  1. The left main coronary artery: The left main coronary artery was free o f significant disease.. 2. Left anterior descending: The left  anterior descending artery gave ris to two septal perforators and two diagonal branches. There was some irregularity in the LAD but no significant obstruction.  3. Circumflex artery: The circumflex artery was a large dominant vesse that gave rise to two marginal branches, two posterolateral branches, and a posterior descending branch. There was some irregularity in the AV portion of the circumflex artery but no significant obstruction.  4. Right coronary artery: The right coronary was a small nondominant vessel. LEFT VENTRICULOGRAM: The left ventriculogram performed in the RAO projection showed good wall motion with no areas of hypokinesis. The estimated ejection fraction was 60%.  LABORATORY DATA: CBC Latest Ref Rng & Units 04/29/2020 04/22/2020 12/02/2019  WBC 3.4 - 10.8 x10E3/uL - 4.0 4.0  Hemoglobin 13.0 - 17.0 g/dL 14.3 15.8 12.8(L)  Hematocrit 39 - 52 % 42.0 46.1 35.9(L)  Platelets 150 - 450 x10E3/uL - 266 244    CMP Latest Ref Rng & Units 04/29/2020 12/02/2019 12/01/2019  Glucose 70 - 99 mg/dL 115(H) 126(H) 122(H)  BUN 8 - 23 mg/dL _0 Creatinine 0.61 -  1.24 mg/dL 0.80 0.80 0.79  Sodium 135 - 145 mmol/L 140 136 133(L)  Potassium 3.5 - 5.1 mmol/L 4.3 4.3 4.1  Chloride 98 - 111 mmol/L 104 108 102  CO2 22 - 32 mmol/L - 18(L) 22  Calcium 8.9 - 10.3 mg/dL - 8.0(L) 8.7(L)  Total Protein 6.5 - 8.1 g/dL - - 6.3(L)  Total Bilirubin 0.3 - 1.2 mg/dL - - 1.1  Alkaline Phos 38 - 126 U/L - - 74  AST 15 - 41 U/L - - 12(L)  ALT 0 - 44 U/L - - 14    Lipid Panel     Component Value Date/Time   CHOL 124 05/08/2010 1434   TRIG 38.0 05/08/2010 1434   HDL 54.20 05/08/2010 1434   CHOLHDL 2 05/08/2010 1434   VLDL 7.6 05/08/2010 1434   LDLCALC 62 05/08/2010 1434    No components found for: NTPROBNP No results for input(s): PROBNP in the last 8760 hours. No results for input(s): TSH in the last 8760 hours.  BMP Recent Labs    11/30/19 2313 11/30/19 2313 12/01/19 0518 12/02/19 0912  04/29/20 1350  NA 133*   < > 133* 136 140  K 4.2   < > 4.1 4.3 4.3  CL 100   < > 102 108 104  CO2 25  --  22 18*  --   GLUCOSE 142*   < > 122* 126* 115*  BUN 18   < > _0 CREATININE 0.90   < > 0.79 0.80 0.80  CALCIUM 8.9  --  8.7* 8.0*  --   GFRNONAA >60  --  >60 >60  --   GFRAA >60  --  >60 >60  --    < > = values in this interval not displayed.    HEMOGLOBIN A1C Lab Results  Component Value Date   HGBA1C 6.5 (H) 10/26/2019   MPG 140 10/26/2019   External Labs: Collected: 01/23/2020 Creatinine 1 mg/dL. eGFR: 75 mL/min per 1.73 m Potassium 4.6 TSH: 4.92 and free T4 110 ng/dL   IMPRESSION:  No diagnosis found.   RECOMMENDATIONS: Shaunak Kreis Michelsen is a 64 y.o. male whose past medical history and cardiac risk factors include: Insulin-dependent diabetes mellitus type 1, hypothyroidism, former smoker (40-year pack history of smoking), hyperlipidemia,  coronary artery calcification, aortic atherosclerosis.   Coronary atherosclerosis due to calcified coronary arteries:  Start aspirin 81 mg p.o. daily.  Continue statin therapy.  I independently reviewed the CT images with the patient and showed him the calcified plaque which appears to be present in the distal left main, LAD and circumflex distribution.   Echocardiogram will be ordered to evaluate for structural heart disease and left ventricular systolic function.  Dyspnea on exertion/anginal equivalent:  Patient has been experiencing dyspnea on exertion which is chronic and stable.  This may be his anginal equivalent as it limits his physical activities on a daily basis.  I did educate the patient that he has multiple other cardiovascular risk factors including insulin-dependent diabetes mellitus type 1, 40-year pack history of smoking, advanced age, and coronary calcification and aortic atherosclerosis and therefore an ischemic evaluation is warranted.  Given the extensive degree of coronary calcification and  insulin-dependent diabetes recommended left heart catheterization with possible intervention to evaluate for obstructive CAD.  Alternatives that were discussed with the patient including coronary stress test.  Shared decision was to proceed with left heart catheterization with possible intervention but he would like to discuss this further with  his wife.  The procedure of left heart catheterization with possible intervention was explained to the patient in detail.   The indication, alternatives, risks and benefits were reviewed.   Complications include but not limited to bleeding, infection, vascular injury, stroke, myocardial infection, arrhythmia, kidney injury which may require hemodialysis, radiation-related injury in the case of prolonged fluoroscopy use, emergency cardiac surgery, and death. The patient understands the risks of serious complication is 1-2 in 1610 with diagnostic cardiac cath and 1-2% or less with angioplasty/stenting. The patient voices understanding and provides verbal feedback and wishes to proceed with coronary angiography with possible PCI.  Carotid bruit: Check carotid duplex.  Continue aspirin and statin therapy  Hyperlipidemia: Continue statin therapy.  Managed by PCP.  Patient is asked to bring his most recent lipid profile at the next office visit.  Benign essential hypertension: Patient's blood pressure at today's office visit was not at goal.  However patient states that his blood pressures at home are well controlled with systolic blood pressures range 120-133.  Low-salt diet recommended.  Currently managed by primary care provider.  Insulin-dependent diabetes mellitus type 1: Currently managed by primary care provider.  Patient denies symptoms of claudication but does have decreased pulses in bilateral lower extremities.  Once ischemic work-up is complete would recommend screening for peripheral artery disease given his multiple cardiovascular risk factors.   Patient verbalized understanding and is agreeable with the plan of care.  FINAL MEDICATION LIST END OF ENCOUNTER: Meds ordered this encounter  Medications   sodium chloride flush (NS) 0.9 % injection 3 mL   sodium chloride flush (NS) 0.9 % injection 3 mL   0.9 %  sodium chloride infusion   aspirin chewable tablet 81 mg   FOLLOWED BY Linked Order Group    0.9% sodium chloride infusion    0.9% sodium chloride infusion   aspirin 81 MG chewable tablet    Scronce, Trina   : cabinet override     Current Facility-Administered Medications:    0.9 %  sodium chloride infusion, 250 mL, Intravenous, PRN, Anay Walter J, MD   0.9% sodium chloride infusion, 3 mL/kg/hr, Intravenous, Continuous, Last Rate: 249 mL/hr at 04/29/20 1342, 3 mL/kg/hr at 04/29/20 1342 **FOLLOWED BY** 0.9% sodium chloride infusion, 1 mL/kg/hr, Intravenous, Continuous, Aveon Colquhoun J, MD, Last Rate: 83 mL/hr at 04/29/20 1443, 1 mL/kg/hr at 04/29/20 1443   aspirin 81 MG chewable tablet, , , ,    sodium chloride flush (NS) 0.9 % injection 3 mL, 3 mL, Intravenous, Q12H, Cosette Prindle J, MD   sodium chloride flush (NS) 0.9 % injection 3 mL, 3 mL, Intravenous, PRN, Jaysin Gayler J, MD  Orders Placed This Encounter  Procedures   Basic metabolic panel   Glucose, capillary   Informed Consent Details: Physician/Practitioner Attestation; Transcribe to consent form and obtain patient signature   Confirm CBC and BMP (or CMP) results within 7 days for inpatient and 30 days for outpatient: Outpatients with severe anemia (hgb<10, CKD, severe thrombocytopenia plts<100) labs should be within 10 days. Only draw PT/INR on patients that are on Coumadin, Hgb<10, have liver disease (cirrhosis, liver CA, hepatitis, etc). Urine pregnancy test within hospital admission for inpatients of child bearing age, for outpatients [facility] of procedure.   Confirm EKG performed within 30 days for cardiac procedures and 12 months  for peripheral vascular procedures.  Place order for EKG if missing or not within timeframe.   Verify aspirin and / or anti-platelet medication (Plavix, Effient, Brilinta) dose available for  cardiac / peripheral vascular procedure [place]. IF ordered daily / once, adjust schedule to administer before procedure.   Weigh patient   Initiate Cath/PCI clinical path; encourage patient to watch CCTV video   I-STAT, chem 8   Insert peripheral IV   Insert 2nd peripheral IV site-Saline lock IV    There are no outpatient Patient Instructions on file for this admission.   --Continue cardiac medications as reconciled in final medication list. --No follow-ups on file. Or sooner if needed. --Continue follow-up with your primary care physician regarding the management of your other chronic comorbid conditions.  Patient's questions and concerns were addressed to his satisfaction. He voices understanding of the instructions provided during this encounter.   This note was created using a voice recognition software as a result there may be grammatical errors inadvertently enclosed that do not reflect the nature of this encounter. Every attempt is made to correct such errors.  Total time spent: 63 minutes.  Independently reviewed the images of the CT scan with patient, reviewed outside records, discussed disease management, obtain consent for left heart catheterization with possible intervention, his questions or concerns addressed to his satisfaction.  Rex Kras, [place], [facility]  Pager: [phone] Office: [phone]

## 2020-04-30 ENCOUNTER — Encounter (HOSPITAL_COMMUNITY): Payer: Self-pay | Admitting: Cardiology

## 2020-05-01 DIAGNOSIS — E1065 Type 1 diabetes mellitus with hyperglycemia: Secondary | ICD-10-CM | POA: Diagnosis not present

## 2020-05-01 DIAGNOSIS — E1029 Type 1 diabetes mellitus with other diabetic kidney complication: Secondary | ICD-10-CM | POA: Diagnosis not present

## 2020-05-07 ENCOUNTER — Other Ambulatory Visit: Payer: Self-pay

## 2020-05-07 ENCOUNTER — Other Ambulatory Visit: Payer: Self-pay | Admitting: Cardiology

## 2020-05-07 ENCOUNTER — Encounter: Payer: Self-pay | Admitting: Cardiology

## 2020-05-07 ENCOUNTER — Ambulatory Visit: Payer: 59 | Admitting: Cardiology

## 2020-05-07 VITALS — BP 109/66 | HR 98 | Resp 16 | Ht 69.0 in | Wt 178.0 lb

## 2020-05-07 DIAGNOSIS — R0609 Other forms of dyspnea: Secondary | ICD-10-CM | POA: Diagnosis not present

## 2020-05-07 DIAGNOSIS — Z794 Long term (current) use of insulin: Secondary | ICD-10-CM | POA: Diagnosis not present

## 2020-05-07 DIAGNOSIS — I2584 Coronary atherosclerosis due to calcified coronary lesion: Secondary | ICD-10-CM | POA: Diagnosis not present

## 2020-05-07 DIAGNOSIS — E782 Mixed hyperlipidemia: Secondary | ICD-10-CM

## 2020-05-07 DIAGNOSIS — I6522 Occlusion and stenosis of left carotid artery: Secondary | ICD-10-CM

## 2020-05-07 DIAGNOSIS — Z87891 Personal history of nicotine dependence: Secondary | ICD-10-CM

## 2020-05-07 DIAGNOSIS — E1059 Type 1 diabetes mellitus with other circulatory complications: Secondary | ICD-10-CM | POA: Diagnosis not present

## 2020-05-07 DIAGNOSIS — I2510 Atherosclerotic heart disease of native coronary artery without angina pectoris: Secondary | ICD-10-CM | POA: Diagnosis not present

## 2020-05-07 MED ORDER — DILTIAZEM HCL ER COATED BEADS 180 MG PO CP24: 180.0000 mg | ORAL_CAPSULE | Freq: Every day | ORAL | 0 refills | Status: DC

## 2020-05-07 MED ORDER — FUROSEMIDE 20 MG PO TABS: 20.0000 mg | ORAL_TABLET | Freq: Every morning | ORAL | 0 refills | Status: DC

## 2020-05-07 MED FILL — DILTIAZEM HCL ER COATED BEA: 180 | 90 days supply | Qty: 90 | Fill #0

## 2020-05-07 MED FILL — FUROSEMIDE 20 MG TABS: 20 | 30 days supply | Qty: 30 | Fill #0

## 2020-05-07 NOTE — Progress Notes (Signed)
Date:  05/07/2020   ID:  James [name], DOB 1956/04/24, MRN [id number]  PCP:  [name], MD  Cardiologist:  Rex Kras, DO, [facility] (established care 04/16/2020)  Date: 05/07/20 Last Office Visit: 04/16/2020  Chief Complaint  Patient presents with  . Coronary Artery Disease  . Post Heart Cath    No stent placement  . Follow-up    4 week    HPI  James [name] is a 64 y.o. male who presents to the office with a chief complaint of "post heart catheterization follow-up." Patient's past medical history and cardiovascular risk factors include: Insulin-dependent diabetes mellitus type 1, hypothyroidism, former smoker (40-year pack history of smoking), hyperlipidemia,  coronary artery calcification, aortic atherosclerosis.   He is referred to the office at the request of [name], MD for evaluation of coronary artery calcification atherosclerosis of the aorta.  Patient states that he had a CTA of the chest back in May 2021 was found to have incidental coronary artery calcification and multiple coronary arteries.  He is referred to cardiology for further evaluation and management.  Patient did not have any chest pain at rest or with effort related activities but has been having effort related dyspnea.  Given his underlying, 40-year pack history of smoking, advanced age former and coronary artery calcification history of his last visit was to proceed with left heart catheterization.  Patient did undergo left heart catheterization and was found to have microvascular dysfunction and no coronary interventions are performed.  He now presents for follow-up.  He continues to have effort related dyspnea.  Since last visit he started to smoke again approximately 0.5 pack/[place] " to calm my nerves."   Denies prior history of myocardial infarction, congestive heart failure, deep venous thrombosis, pulmonary embolism, stroke, transient ischemic attack.  FUNCTIONAL STATUS: Started riding his bike  regularly less than 1 mile per [place].    ALLERGIES: Allergies  Allergen Reactions  . Augmentin [Amoxicillin-Pot Clavulanate] Nausea And Vomiting and Other (See Comments)    Did it involve swelling of the face/tongue/throat, SOB, or low BP? No Did it involve sudden or severe rash/hives, skin peeling, or any reaction on the inside of your mouth or nose? No Did you need to seek medical attention at a hospital or doctor's office? No When did it last happen?5+ years If all above answers are "NO", may proceed with cephalosporin use.   [name] [Escitalopram Oxalate] Itching    MEDICATION LIST PRIOR TO VISIT: Current Meds  Medication Sig  . acetaminophen (TYLENOL) 500 MG tablet Take 1,000 mg by mouth every 6 (six) hours as needed for mild pain or headache.  [name] aspirin EC 81 MG tablet Take 1 tablet (81 mg total) by mouth daily. Swallow whole. (Patient taking differently: Take 81 mg by mouth at bedtime. Swallow whole.)  . finasteride (PROPECIA) 1 MG tablet Take 1 mg by mouth every evening.  [name] ibuprofen (ADVIL) 200 MG tablet Take 800 mg by mouth every 8 (eight) hours as needed (pain.).  [name] insulin lispro (HUMALOG) 100 UNIT/ML KwikPen Junior Inject 0.05 mLs (5 Units total) into the skin 3 (three) times daily before meals.  . Insulin Pen Needle 31G X 5 MM MISC 1 Device by Does not apply route 4 (four) times daily. For use with insulin pens  . levothyroxine (SYNTHROID) 100 MCG tablet Take 100 mcg by mouth every evening.  [name] MINOXIDIL EX Apply 1 application topically daily. For hair loss  . mometasone-formoterol (DULERA) 100-5 MCG/ACT AERO  Inhale 2 puffs into the lungs in the morning and at bedtime.  . polyvinyl alcohol (LIQUIFILM TEARS) 1.4 % ophthalmic solution Place 1 drop into both eyes as needed for dry eyes.  . promethazine (PHENERGAN) 25 MG tablet Take 25 mg by mouth every 8 (eight) hours as needed for nausea or vomiting.   . simvastatin (ZOCOR) 40 MG tablet Take 40 mg by mouth at bedtime.     . traZODone (DESYREL) 150 MG tablet Take 150 mg by mouth at bedtime.     PAST MEDICAL HISTORY: Past Medical History:  Diagnosis Date  . ANXIETY 04/03/2007  . Anxiety   . ASTHMA 09/06/2008  . Asthma   . ASTHMATIC BRONCHITIS, ACUTE 10/25/2008  . Bladder neck obstruction   . CARPAL TUNNEL SYNDROME, BILATERAL 07/31/2007   issues resolved, no surgery  . Cervical disc disease   . Chronic bronchitis ([place])    "get it about q yr" (02/12/2014)  . COPD (chronic obstructive pulmonary disease) ([place])   . CORONARY ARTERY DISEASE 04/03/2007  . DEPRESSION 04/03/2007  . Depression   . DIABETES MELLITUS, TYPE I 04/03/2007  . Diabetic retinopathy associated with diabetes mellitus due to underlying condition ([place]) 04/03/2007  . DM W/EYE MANIFESTATIONS, TYPE I, UNCONTROLLED 04/04/2007  . 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 ED (erectile dysfunction)   . History of kidney stones   . HYPERLIPIDEMIA 04/04/2007  . Pneumonia    "several times and again today" (02/13/2104)  . Renal insufficiency   . Seizures ([place])    "insulin seizure from time to time; none in the last couple years" (02/12/2014)  . Spinal stenosis     PAST SURGICAL HISTORY: Past Surgical History:  Procedure Laterality Date  . ANTERIOR CERVICAL DECOMP/DISCECTOMY FUSION  2000   "couple screws and a plate"  . ANTERIOR CERVICAL DECOMP/DISCECTOMY FUSION N/A 06/24/2016   Procedure: ANTERIOR CERVICAL DECOMPRESSION/DISCECTOMY FUSION CERVICAL FOUR - CERVICAL FIVE, CERVICAL FIVE - CERVICAL SIX; REMOVAL TETHER CERVICAL PLATE;  Surgeon: Jovita Gamma, MD;  Location: [place];  Service: Neurosurgery;  Laterality: N/A;  ANTERIOR CERVICAL DECOMPRESSION/DISCECTOMY FUSION CERVICAL FOUR - CERVICAL FIVE, CERVICAL FIVE - CERVICAL SIX; REMOVAL TETHER CERVICAL PLATE  . APPENDECTOMY    . BACK SURGERY    . CARDIAC CATHETERIZATION  1990's  . CATARACT EXTRACTION W/ INTRAOCULAR LENS  IMPLANT, BILATERAL Bilateral   . CYSTOSCOPY WITH RETROGRADE PYELOGRAM,  URETEROSCOPY AND STENT PLACEMENT Bilateral 04/06/2013   Procedure: BILATERAL CYSTOSCOPY WITH RETROGRADE PYELOGRAMS, STENT PLACEMENTS AND LEFT URETEROSCOPY AND STONE REMOVAL;  Surgeon: Alexis Frock, MD;  Location: [facility];  Service: Urology;  Laterality: Bilateral;  . CYSTOSCOPY WITH STENT PLACEMENT Right 04/12/2013   Procedure: CYSTOSCOPY WITH STENT PLACEMENT;  Surgeon: Alexis Frock, MD;  Location: [facility];  Service: Urology;  Laterality: Right;  . CYSTOSCOPY/RETROGRADE/URETEROSCOPY Bilateral 04/12/2013   Procedure: CYSTOSCOPY/RETROGRADE/URETEROSCOPY;  Surgeon: Alexis Frock, MD;  Location: [facility];  Service: Urology;  Laterality: Bilateral;  RIGHT RETROGRADE   . HOLMIUM LASER APPLICATION Left 6/31/4970   Procedure: HOLMIUM LASER APPLICATION;  Surgeon: Alexis Frock, MD;  Location: [facility];  Service: Urology;  Laterality: Left;  . LEFT HEART CATH AND CORONARY ANGIOGRAPHY N/A 04/29/2020   Procedure: LEFT HEART CATH AND CORONARY ANGIOGRAPHY;  Surgeon: Nigel Mormon, MD;  Location: [place] [facility];  Service: Cardiovascular;  Laterality: N/A;  . LUMBAR LAMINECTOMY/DECOMPRESSION MICRODISCECTOMY N/A 04/20/2019   Procedure: Lumbar microdisectomy and decompression L5-S1 left;  Surgeon: Latanya Maudlin, MD;  Location: [facility];  Service: Orthopedics;  Laterality: N/A;  72mn  . LYMPH NODE DISSECTION  ~ 1960   groin  . stress cardiolite  09/06/2002  . TONSILLECTOMY    . VITRECTOMY Bilateral     FAMILY HISTORY: The patient family history includes Cancer in his mother; Diabetes in his brother; Heart disease in his brother; Stroke in his father.  SOCIAL HISTORY:  The patient  reports that he quit smoking about 8 months ago. His smoking use included cigarettes. He smoked 0.00 packs per [place]. He has never used smokeless tobacco. He reports that he does not drink alcohol and does not use drugs.  REVIEW OF SYSTEMS: Review of Systems  Constitutional: Negative for chills and fever.  HENT: Negative for  hoarse voice and nosebleeds.   Eyes: Negative for discharge, double vision and pain.  Cardiovascular: Positive for dyspnea on exertion. Negative for chest pain, claudication, leg swelling, near-syncope, orthopnea, palpitations, paroxysmal nocturnal dyspnea and syncope.  Respiratory: Negative for hemoptysis and shortness of breath.   Musculoskeletal: Negative for muscle cramps and myalgias.  Gastrointestinal: Negative for abdominal pain, constipation, diarrhea, hematemesis, hematochezia, melena, nausea and vomiting.  Neurological: Negative for dizziness and light-headedness.    PHYSICAL EXAM: Vitals with BMI 05/07/2020 04/29/2020 04/29/2020  Height _0  - -  Weight 178 lbs - -  BMI 263.78- -  Systolic [id number] Diastolic 66 49 50  Pulse 98 59 51    CONSTITUTIONAL: Well-developed and well-nourished. No acute distress.  SKIN: Skin is warm and dry. No rash noted. No cyanosis. No pallor. No jaundice HEAD: Normocephalic and atraumatic.  EYES: No scleral icterus MOUTH/THROAT: Moist oral membranes.  NECK: No JVD present. No thyromegaly noted. Left carotid bruit.  LYMPHATIC: No visible cervical adenopathy.  CHEST Normal respiratory effort. No intercostal retractions  LUNGS: Clear to auscultation bilaterally. No stridor. No wheezes. No rales.  CARDIOVASCULAR: Regular rate and rhythm, positive S1-S2, no murmurs rubs or gallops appreciated. ABDOMINAL: No apparent ascites.  EXTREMITIES: No peripheral edema, decreased DP and PT bilaterally.  HEMATOLOGIC: No significant bruising NEUROLOGIC: Oriented to person, place, and time. Nonfocal. Normal muscle tone.  PSYCHIATRIC: Normal mood and affect. Normal behavior. Cooperative  CARDIAC DATABASE: EKG: 04/16/2020: Normal sinus rhythm, 67 bpm, normal axis, without underlying ischemia or injury pattern.    Echocardiogram: 04/22/2020: Left ventricle cavity is normal in size. Moderate concentric hypertrophy of the left ventricle. Normal global wall  motion. Normal LV systolic function with EF 55%. Doppler evidence of grade I (impaired) diastolic dysfunction, normal LAP. Mild tricuspid regurgitation. No evidence of pulmonary hypertension.   Stress Testing: No results found for this or any previous visit from the past 1095 days.  Heart Catheterization:  04/29/2020: LM: Normal LAD: 75% stenosis in mid small caliber distal LAD. TIMI II flow, only marginally improved after IC NTG 200 mg. LCx: Large dominant vessel with TIMI II flow, only marginally improved after IC NTG 200 mg RCA: Small non-dominant vessel with TIMI II flow Suspect microvascular dysfunction. Consider addition of calcium channel blocker.   Carotid artery duplex 112/87/8676  Peak systolic velocities in the right bifurcation, internal, external and common carotid arteries are within normal limits. There is mild  heterogeneous plaque noted in the right carotid artery.  Stenosis in the left internal carotid artery (16-49%). Stenosis in the left external carotid artery (<50%). Heterogeneous plaque.  Antegrade right vertebral artery flow. Antegrade left vertebral artery flow.  Follow up in one year is appropriate if clinically indicated.  LABORATORY DATA: CBC Latest Ref Rng & Units 04/29/2020 04/22/2020  12/02/2019  WBC 3.4 - 10.8 x10E3/uL - 4.0 4.0  Hemoglobin 13.0 - 17.0 g/dL 14.3 15.8 12.8(L)  Hematocrit 39 - 52 % 42.0 46.1 35.9(L)  Platelets 150 - 450 x10E3/uL - 266 244    CMP Latest Ref Rng & Units 04/29/2020 04/29/2020 12/02/2019  Glucose 70 - 99 mg/dL 115(H) 114(H) 126(H)  BUN 8 - 23 mg/dL _0 Creatinine 0.61 - 1.24 mg/dL 0.80 0.86 0.80  Sodium 135 - 145 mmol/L 140 138 136  Potassium 3.5 - 5.1 mmol/L 4.3 4.5 4.3  Chloride 98 - 111 mmol/L 104 107 108  CO2 22 - 32 mmol/L - 23 18(L)  Calcium 8.9 - 10.3 mg/dL - 8.8(L) 8.0(L)  Total Protein 6.5 - 8.1 g/dL - - -  Total Bilirubin 0.3 - 1.2 mg/dL - - -  Alkaline Phos 38 - 126 U/L - - -  AST 15 - 41 U/L - - -    ALT 0 - 44 U/L - - -    Lipid Panel     Component Value Date/Time   CHOL 124 05/08/2010 1434   TRIG 38.0 05/08/2010 1434   HDL 54.20 05/08/2010 1434   CHOLHDL 2 05/08/2010 1434   VLDL 7.6 05/08/2010 1434   LDLCALC 62 05/08/2010 1434    No components found for: NTPROBNP No results for input(s): PROBNP in the last 8760 hours. No results for input(s): TSH in the last 8760 hours.  BMP Recent Labs    11/30/19 2313 11/30/19 2313 12/01/19 0518 12/01/19 0518 12/02/19 0912 04/29/20 1332 04/29/20 1350  NA 133*   < > 133*   < > 136 138 140  K 4.2   < > 4.1   < > 4.3 4.5 4.3  CL 100   < > 102   < > 108 107 104  CO2 25   < > 22  --  18* 23  --   GLUCOSE 142*   < > 122*   < > 126* 114* 115*  BUN 18   < > 18   < > _1 CREATININE 0.90   < > 0.79   < > 0.80 0.86 0.80  CALCIUM 8.9   < > 8.7*  --  8.0* 8.8*  --   GFRNONAA >60   < > >60  --  >60 >60  --   GFRAA >60  --  >60  --  >60  --   --    < > = values in this interval not displayed.    HEMOGLOBIN A1C Lab Results  Component Value Date   HGBA1C 6.5 (H) 10/26/2019   MPG 140 10/26/2019   External Labs: Collected: 01/23/2020 Creatinine 1 mg/dL. eGFR: 75 mL/min per 1.73 m Potassium 4.6 TSH: 4.92 and free T4 110 ng/dL   IMPRESSION:    ICD-10-CM   1. Dyspnea on exertion  R06.00 furosemide (LASIX) 20 MG tablet    Basic metabolic panel    Magnesium    Pro b natriuretic peptide (BNP)  2. Asymptomatic stenosis of left carotid artery  I65.22   3. Coronary atherosclerosis due to calcified coronary lesion of native artery  I25.10 diltiazem (CARDIZEM CD) 180 MG 24 hr capsule   I25.84   4. Long-term insulin use (HCC)  Z79.4   5. Type 1 diabetes mellitus with other circulatory complication (HCC)  V74.82   6. Former smoker  Z87.891   2. Mixed hyperlipidemia  E78.2      RECOMMENDATIONS: James [name]  is a 64 y.o. male whose past medical history and cardiac risk factors include: Insulin-dependent diabetes mellitus type 1,  hypothyroidism, former smoker (40-year pack history of smoking), hyperlipidemia,  coronary artery calcification, aortic atherosclerosis.   Coronary atherosclerosis due to calcified coronary arteries and microvascular dysfunction based on invasive angiography:  Continue aspirin and statin therapy.    We will start diltiazem given findings of microvascular dysfunction.  Patient has not been started on beta-blocker therapy due to underlying depressive symptoms.  Patient is asked to discuss this further with PCP.    Educated on importance of continued risk factor modifications.    Echocardiogram results reviewed with the patient at today's visit as well.  Dyspnea on exertion:  Start Lasix 20 mg p.o. every morning.  Repeat blood work in 1 week to evaluate kidney function and electrolytes.  Patient is asked to see pulmonary evaluation as he may also have underlying pulmonary conditions contributing to his effort related dyspnea.  Clinically patient is euvolemic and not in congestive heart failure.  Asymptomatic left carotid artery stenosis:  Continue aspirin and statin therapy.  Repeat 1 year follow-up carotid duplex scheduled.  Findings of the recent carotid duplex discussed with the patient in great detail.  Hyperlipidemia: Continue statin therapy.  Managed by PCP.  Patient is asked to bring his most recent lipid profile at the next office visit.  Insulin-dependent diabetes mellitus type 1: Currently managed by primary care provider.  FINAL MEDICATION LIST END OF ENCOUNTER: Meds ordered this encounter  Medications  . diltiazem (CARDIZEM CD) 180 MG 24 hr capsule    Sig: Take 1 capsule (180 mg total) by mouth daily.    Dispense:  90 capsule    Refill:  0  . furosemide (LASIX) 20 MG tablet    Sig: Take 1 tablet (20 mg total) by mouth in the morning.    Dispense:  30 tablet    Refill:  0     Current Outpatient Medications:  .  acetaminophen (TYLENOL) 500 MG tablet, Take 1,000 mg  by mouth every 6 (six) hours as needed for mild pain or headache., Disp: , Rfl:  .  aspirin EC 81 MG tablet, Take 1 tablet (81 mg total) by mouth daily. Swallow whole. (Patient taking differently: Take 81 mg by mouth at bedtime. Swallow whole.), Disp: 30 tablet, Rfl: 11 .  finasteride (PROPECIA) 1 MG tablet, Take 1 mg by mouth every evening., Disp: , Rfl:  .  ibuprofen (ADVIL) 200 MG tablet, Take 800 mg by mouth every 8 (eight) hours as needed (pain.)., Disp: , Rfl:  .  insulin lispro (HUMALOG) 100 UNIT/ML KwikPen Junior, Inject 0.05 mLs (5 Units total) into the skin 3 (three) times daily before meals., Disp: 3 mL, Rfl: 0 .  Insulin Pen Needle 31G X 5 MM MISC, 1 Device by Does not apply route 4 (four) times daily. For use with insulin pens, Disp: 100 each, Rfl: 0 .  levothyroxine (SYNTHROID) 100 MCG tablet, Take 100 mcg by mouth every evening., Disp: , Rfl:  .  MINOXIDIL EX, Apply 1 application topically daily. For hair loss, Disp: , Rfl:  .  mometasone-formoterol (DULERA) 100-5 MCG/ACT AERO, Inhale 2 puffs into the lungs in the morning and at bedtime., Disp: 13 g, Rfl: 3 .  polyvinyl alcohol (LIQUIFILM TEARS) 1.4 % ophthalmic solution, Place 1 drop into both eyes as needed for dry eyes., Disp: , Rfl:  .  promethazine (PHENERGAN) 25 MG tablet, Take 25 mg by mouth every 8 (  eight) hours as needed for nausea or vomiting. , Disp: , Rfl:  .  simvastatin (ZOCOR) 40 MG tablet, Take 40 mg by mouth at bedtime. , Disp: , Rfl: 5 .  traZODone (DESYREL) 150 MG tablet, Take 150 mg by mouth at bedtime., Disp: , Rfl: 5 .  albuterol (PROVENTIL) (2.5 MG/3ML) 0.083% nebulizer solution, USE 1 VIAL VIA NEBULIZER EVERY 6 HOURS AS NEEDED FOR WHEEZING OR SHORTNESS OF BREATH (Patient not taking: Reported on 05/07/2020), Disp: 180 mL, Rfl: 3 .  albuterol (VENTOLIN HFA) 108 (90 Base) MCG/ACT inhaler, INHALE 2 PUFFS BY MOUTH INTO THE LUNGS EVERY 6 HOURS AS NEEDED FOR WHEEZING OR SHORTNESS OF BREATH. (Patient not taking: Reported  on 05/07/2020), Disp: 18 g, Rfl: 2 .  diltiazem (CARDIZEM CD) 180 MG 24 hr capsule, Take 1 capsule (180 mg total) by mouth daily., Disp: 90 capsule, Rfl: 0 .  furosemide (LASIX) 20 MG tablet, Take 1 tablet (20 mg total) by mouth in the morning., Disp: 30 tablet, Rfl: 0 .  sodium chloride HYPERTONIC 3 % nebulizer solution, Take by nebulization 2 (two) times daily. (Patient not taking: Reported on 04/18/2020), Disp: 240 mL, Rfl: 3  Orders Placed This Encounter  Procedures  . Basic metabolic panel  . Magnesium  . Pro b natriuretic peptide (BNP)    There are no Patient Instructions on file for this visit.   --Continue cardiac medications as reconciled in final medication list. --Return in about 6 weeks (around 06/18/2020) for Reevaluation of, Dyspnea and microvascluar dysfunction. Or sooner if needed. --Continue follow-up with your primary care physician regarding the management of your other chronic comorbid conditions.  Patient's questions and concerns were addressed to his satisfaction. He voices understanding of the instructions provided during this encounter.   This note was created using a voice recognition software as a result there may be grammatical errors inadvertently enclosed that do not reflect the nature of this encounter. Every attempt is made to correct such errors.  Total time spent: 8mnutes.  Independently reviewed the heart cath images with the patient at today's visit, reviewed the results of the echocardiogram and carotid duplex, discussed disease management, prescribed pharmacological therapy, ordered laboratory tests, coordination of care.   SRex Kras DN[place] [facility] Pager: [phone]: [phone]

## 2020-05-13 ENCOUNTER — Telehealth: Payer: Self-pay

## 2020-05-13 NOTE — Telephone Encounter (Signed)
Spoke with wife [name] per DPR who is questioning the PA for patient's Chantix. She advised me to call [phone] to submit request for PA. Spoke with Isis and started PA for generic Chantix 1 mg BID #60. She states that it will take 24-72 hours to get a response. She said we will receive a fax to our office with outcome. We will wait to get response

## 2020-05-16 ENCOUNTER — Encounter: Payer: Self-pay | Admitting: *Deleted

## 2020-05-16 ENCOUNTER — Telehealth: Payer: Self-pay | Admitting: Pulmonary Disease

## 2020-05-16 NOTE — Telephone Encounter (Signed)
PA response has been received from [employer]:  Prior authorization requests requiring clinical review are completed by a licensed pharmacist/physician clinical reviewer. Based upon the information we received from you/healthcare practitioner, we are unable to authorize Chantix 1mg  cont month box at this time.  The reason for this determination is:  We were asked to cover a larger amount of the drug listed at the top of this letter than allowed under our quantity limit rule. The pharmacy benefits allow 180 days of smoking cessation products per year. According to our records, you have already used the amount allowed by the plan during this previous year. This is why the request was denied.  [employer] also bases decisions on your benefit, as outlined in your plan description. Copies of information relevant to this prior authorization request, including the rule, guideline, protocol, or criteria that [employer] relied upon in making this decision is available to you and your healthcare practitioner upon request and at no charge.  To request, contact us at [phone].  Attempted to call pt's wife to further discuss this but unable to reach. Left message for her to return call.

## 2020-05-16 NOTE — Progress Notes (Signed)
error 

## 2020-05-16 NOTE — Telephone Encounter (Signed)
Duplicate encounter

## 2020-05-16 NOTE — Telephone Encounter (Signed)
Called and spoke with [name] letting her know the status of the PA that it was denied. She is asking to have the appeal letter refaxed as she stated that it was never received by [employer] in September 2021 when it was originally faxed.  Letter has been refaxed. Will keep encounter open while we await response.

## 2020-05-26 ENCOUNTER — Telehealth: Payer: Self-pay | Admitting: Pulmonary Disease

## 2020-05-26 MED FILL — SIMVASTATIN 40 MG TABLET: 40 | 30 days supply | Qty: 30 | Fill #3

## 2020-05-26 NOTE — Telephone Encounter (Signed)
Spoke with the pt  He states pharmacy advised him that the [facility] is no longer covered  They are asking him to try the advair or breo  Pt states that he has already tried these and they did not help  He only wants the [facility]  Please advise if you want him to retry the preferred meds or you want to do PA for [facility]

## 2020-05-27 NOTE — Telephone Encounter (Signed)
Please start PA for him. Advair & breo have not worked for him in the past. He needs OV , last seen 12/2019

## 2020-05-27 NOTE — Telephone Encounter (Signed)
PA has been initiated for pt's Dulera 100. Pt has been made aware that PA was initiated. Pt has also been scheduled a f/u appt with RA.  Medication name and strength: Dulera 100 Provider: [place]: [facility] Patient insurance ID: [id number] Phone: [phone] Fax: [phone]  Was the PA started on CMM?  yes If yes, please enter the Key: [id number] Timeframe for approval/denial: [employer] is processing your PA request and will respond shortly with next steps. You may close this dialog, return to your dashboard, and perform other tasks. To check for an update later, open this request again from your dashboard.  If you need assistance, please chat with CoverMyMeds or call us at [phone].  Will keep encounter open while we await PA update.

## 2020-05-28 ENCOUNTER — Other Ambulatory Visit: Payer: Self-pay | Admitting: *Deleted

## 2020-05-28 DIAGNOSIS — Z87891 Personal history of nicotine dependence: Secondary | ICD-10-CM

## 2020-05-28 DIAGNOSIS — F17210 Nicotine dependence, cigarettes, uncomplicated: Secondary | ICD-10-CM

## 2020-05-28 NOTE — Telephone Encounter (Signed)
Checked CMM for update.  PA was not sent to plan due to missing question. Answer been giving and PA has been sent to plan.

## 2020-05-29 DIAGNOSIS — I2510 Atherosclerotic heart disease of native coronary artery without angina pectoris: Secondary | ICD-10-CM | POA: Diagnosis not present

## 2020-05-29 DIAGNOSIS — Z4681 Encounter for fitting and adjustment of insulin pump: Secondary | ICD-10-CM | POA: Diagnosis not present

## 2020-05-29 DIAGNOSIS — Z794 Long term (current) use of insulin: Secondary | ICD-10-CM | POA: Diagnosis not present

## 2020-05-29 DIAGNOSIS — E103593 Type 1 diabetes mellitus with proliferative diabetic retinopathy without macular edema, bilateral: Secondary | ICD-10-CM | POA: Diagnosis not present

## 2020-05-29 DIAGNOSIS — F17200 Nicotine dependence, unspecified, uncomplicated: Secondary | ICD-10-CM | POA: Diagnosis not present

## 2020-05-29 NOTE — Telephone Encounter (Signed)
Fax from [employer] has been received in regards to pt's Chantix from the appeal we did. Fax states:  We were asked to cover a larger amount of the drug listed  (Chantix 1mg  cont month box) allowed under the quantity limit rule. The pharmacy benefits allow 180 days of smoking cessation products per year. According to our records, you have already used the amount allowed by the plan during this previous year. This is why the request was denied.  Please note that since your provider noted that other smoking cessation products have not worked for you, a one-time approval for one additional month of Chantix has been entered to allow you to obtain a sixth month of treatment for the 2021 calendar year with Chantix.  This determination is based on the information provided by you/your healthcare practitioner as well as in accordance with your eligibility for coverage and the terms and conditions of your governing plan document in effect at the time services are requested.   Attempted to call pt's wife [name] letting her know this info but unable to reach. Left a message for her to return call.

## 2020-05-29 NOTE — Telephone Encounter (Signed)
Checked status on CMM, PA is still in process.

## 2020-05-31 NOTE — Telephone Encounter (Signed)
Per CMM, PA is still in process.

## 2020-06-02 ENCOUNTER — Other Ambulatory Visit (HOSPITAL_COMMUNITY): Payer: Self-pay | Admitting: Endocrinology

## 2020-06-03 NOTE — Telephone Encounter (Signed)
Called [employer] [phone]) as the PA in [facility] is still pending. Spoke to rep, Hilda Blades, and was advised there are additional questions that need to be answered. I attempted to complete this over the phone but she states the form has to be completed and signed by the prescribing office and then faxed back.   This was faxed to our office on 11/14, she states this has been faxed again.  Will forward to [facility] to keep an eye out for the form.

## 2020-06-05 NOTE — Telephone Encounter (Signed)
I have not seen this letter yet in Brian's box. I have been on the lookout for it but no such luck yet unless it was put in the Drs box instead.

## 2020-06-19 ENCOUNTER — Other Ambulatory Visit: Payer: Self-pay | Admitting: Adult Health

## 2020-06-19 ENCOUNTER — Other Ambulatory Visit (HOSPITAL_COMMUNITY): Payer: Self-pay | Admitting: Endocrinology

## 2020-06-19 ENCOUNTER — Ambulatory Visit: Payer: 59 | Admitting: Cardiology

## 2020-06-19 MED FILL — CONTOUR NEXT STRIPS: 75 days supply | Qty: 600 | Fill #1

## 2020-06-19 MED FILL — HumaLOG 100 UNIT/ML SOLN: 100 | 90 days supply | Qty: 90 | Fill #0

## 2020-06-20 ENCOUNTER — Other Ambulatory Visit: Payer: Self-pay | Admitting: Adult Health

## 2020-06-20 MED FILL — ALBUTEROL SULFATE HFA 108 (: 108 (90 BAS | 25 days supply | Qty: 18 | Fill #0

## 2020-06-23 ENCOUNTER — Other Ambulatory Visit: Payer: Self-pay

## 2020-06-23 ENCOUNTER — Encounter: Payer: Self-pay | Admitting: Acute Care

## 2020-06-23 ENCOUNTER — Ambulatory Visit (INDEPENDENT_AMBULATORY_CARE_PROVIDER_SITE_OTHER)
Admission: RE | Admit: 2020-06-23 | Discharge: 2020-06-23 | Disposition: A | Discharge status: Home / Self Care | Payer: 59 | Source: Ambulatory Visit | Attending: Cardiovascular Disease | Admitting: Cardiovascular Disease

## 2020-06-23 ENCOUNTER — Ambulatory Visit (INDEPENDENT_AMBULATORY_CARE_PROVIDER_SITE_OTHER): Payer: 59 | Admitting: Acute Care

## 2020-06-23 VITALS — BP 110/70 | HR 77 | Temp 97.3°F | Ht 68.0 in | Wt 180.0 lb

## 2020-06-23 DIAGNOSIS — Z87891 Personal history of nicotine dependence: Secondary | ICD-10-CM

## 2020-06-23 DIAGNOSIS — F17210 Nicotine dependence, cigarettes, uncomplicated: Secondary | ICD-10-CM | POA: Diagnosis not present

## 2020-06-23 DIAGNOSIS — Z122 Encounter for screening for malignant neoplasm of respiratory organs: Secondary | ICD-10-CM

## 2020-06-23 NOTE — Progress Notes (Signed)
Shared Decision Making Visit Lung Cancer Screening Program [phone])   Eligibility:  Age 64 y.o.  Pack Years Smoking History Calculation 46 pack year smoking history (# packs/per year x # years smoked)  Recent History of coughing up blood  no  Unexplained weight loss? no ( >Than 15 pounds within the last 6 months )  Prior History Lung / other cancer no (Diagnosis within the last 5 years already requiring surveillance chest CT Scans).  Smoking Status Current Smoker  Former Smokers: Years since quit: NA  Quit Date: NA  Visit Components:  Discussion included one or more decision making aids. yes  Discussion included risk/benefits of screening. yes  Discussion included potential follow up diagnostic testing for abnormal scans. yes  Discussion included meaning and risk of over diagnosis. yes  Discussion included meaning and risk of False Positives. yes  Discussion included meaning of total radiation exposure. yes  Counseling Included:  Importance of adherence to annual lung cancer LDCT screening. yes  Impact of comorbidities on ability to participate in the program. yes  Ability and willingness to under diagnostic treatment. yes  Smoking Cessation Counseling:  Current Smokers:   Discussed importance of smoking cessation. yes  Information about tobacco cessation classes and interventions provided to patient. yes  Patient provided with "ticket" for LDCT Scan. yes  Symptomatic Patient. no  Counseling NA  Diagnosis Code: Tobacco Use Z72.0  Asymptomatic Patient yes  Counseling (Intermediate counseling: > three minutes counseling) L2440  Former Smokers:   Discussed the importance of maintaining cigarette abstinence. yes  Diagnosis Code: Personal History of Nicotine Dependence. N02.725  Information about tobacco cessation classes and interventions provided to patient. Yes  Patient provided with "ticket" for LDCT Scan. yes  Written Order for Lung Cancer  Screening with LDCT placed in Epic. Yes (CT Chest Lung Cancer Screening Low Dose W/O CM) [id number] Z12.2-Screening of respiratory organs Z87.891-Personal history of nicotine dependence  BP 110/70 (BP Location: Left Arm, Cuff Size: Normal)   Pulse 77   Temp (!) 97.3 F (36.3 C) (Oral)   Ht 5\' 8"  (1.727 m)   Wt 180 lb (81.6 kg)   SpO2 98%   BMI 27.37 kg/m    I have spent 25 minutes of face to face time with Mr. Endsley discussing the risks and benefits of lung cancer screening. We viewed a power point together that explained in detail the above noted topics. We paused at intervals to allow for questions to be asked and answered to ensure understanding.We discussed that the single most powerful action that he can take to decrease his risk of developing lung cancer is to quit smoking. We discussed whether or not he is ready to commit to setting a quit date. We discussed options for tools to aid in quitting smoking including nicotine replacement therapy, non-nicotine medications, support groups, Quit Smart classes, and behavior modification. We discussed that often times setting smaller, more achievable goals, such as eliminating 1 cigarette a [place]ringa for a week and then 2 cigarettes a [place] for a week can be helpful in slowly decreasing the number of cigarettes smoked. This allows for a sense of accomplishment as well as providing a clinical benefit. I gave him the " Be Stronger Than Your Excuses" card with contact information for community resources, classes, free nicotine replacement therapy, and access to mobile apps, text messaging, and on-line smoking cessation help. I have also given him my card and contact information in the event he needs to contact me. We discussed the  time and location of the scan, and that either Doroteo Glassman RN or I will call with the results within 24-48 hours of receiving them. I have offered him  a copy of the power point we viewed  as a resource in the event they need reinforcement  of the concepts we discussed today in the office. The patient verbalized understanding of all of  the above and had no further questions upon leaving the office. They have my contact information in the event they have any further questions.  I spent 3 minutes counseling on smoking cessation and the health risks of continued tobacco abuse.  I explained to the patient that there has been a high incidence of coronary artery disease noted on these exams. I explained that this is a non-gated exam therefore degree or severity cannot be determined. This patient is currently on statin therapy. I have asked the patient to follow-up with their PCP regarding any incidental finding of coronary artery disease and management with diet or medication as their PCP  feels is clinically indicated. The patient verbalized understanding of the above and had no further questions upon completion of the visit.      Magdalen Spatz, NP 06/23/2020 3:05 PM

## 2020-06-23 NOTE — Patient Instructions (Signed)
Thank you for participating in the [facility] Lung Cancer Screening Program. It was our pleasure to meet you today. We will call you with the results of your scan within the next few days. Your scan will be assigned a Lung RADS category score by the physicians reading the scans.  This Lung RADS score determines follow up scanning.  See below for description of categories, and follow up screening recommendations. We will be in touch to schedule your follow up screening annually or based on recommendations of our providers. We will fax a copy of your scan results to your Primary Care Physician, or the physician who referred you to the program, to ensure they have the results. Please call the office if you have any questions or concerns regarding your scanning experience or results.  Our office number is [phone]. Please speak with Denise Phelps, RN. She is our Lung Cancer Screening RN. If she is unavailable when you call, please have the office staff send her a message. She will return your call at her earliest convenience. Remember, if your scan is normal, we will scan you annually as long as you continue to meet the criteria for the program. (Age 55-77, Current smoker or smoker who has quit within the last 15 years). If you are a smoker, remember, quitting is the single most powerful action that you can take to decrease your risk of lung cancer and other pulmonary, breathing related problems. We know quitting is hard, and we are here to help.  Please let us know if there is anything we can do to help you meet your goal of quitting. If you are a former smoker, congratulations. We are proud of you! Remain smoke free! Remember you can refer friends or family members through the number above.  We will screen them to make sure they meet criteria for the program. Thank you for helping us take better care of you by participating in Lung Screening.  Lung RADS Categories:  Lung RADS 1: no nodules  or definitely non-concerning nodules.  Recommendation is for a repeat annual scan in 12 months.  Lung RADS 2:  nodules that are non-concerning in appearance and behavior with a very low likelihood of becoming an active cancer. Recommendation is for a repeat annual scan in 12 months.  Lung RADS 3: nodules that are probably non-concerning , includes nodules with a low likelihood of becoming an active cancer.  Recommendation is for a 6-month repeat screening scan. Often noted after an upper respiratory illness. We will be in touch to make sure you have no questions, and to schedule your 6-month scan.  Lung RADS 4 A: nodules with concerning findings, recommendation is most often for a follow up scan in 3 months or additional testing based on our provider's assessment of the scan. We will be in touch to make sure you have no questions and to schedule the recommended 3 month follow up scan.  Lung RADS 4 B:  indicates findings that are concerning. We will be in touch with you to schedule additional diagnostic testing based on our provider's  assessment of the scan.   

## 2020-06-24 ENCOUNTER — Ambulatory Visit: Payer: 59 | Admitting: Pulmonary Disease

## 2020-06-24 ENCOUNTER — Encounter: Payer: Self-pay | Admitting: Pulmonary Disease

## 2020-06-24 VITALS — BP 112/60 | HR 81 | Temp 97.6°F | Ht 69.0 in | Wt 180.4 lb

## 2020-06-24 DIAGNOSIS — J690 Pneumonitis due to inhalation of food and vomit: Secondary | ICD-10-CM | POA: Diagnosis not present

## 2020-06-24 DIAGNOSIS — Z720 Tobacco use: Secondary | ICD-10-CM | POA: Diagnosis not present

## 2020-06-24 DIAGNOSIS — J449 Chronic obstructive pulmonary disease, unspecified: Secondary | ICD-10-CM | POA: Diagnosis not present

## 2020-06-24 MED ORDER — VARENICLINE TARTRATE 0.5 MG X 11 & 1 MG X 42 PO MISC: ORAL | 0 refills | Status: DC

## 2020-06-24 MED ORDER — VARENICLINE TARTRATE 1 MG PO TABS: 1.0000 mg | ORAL_TABLET | Freq: Two times a day (BID) | ORAL | 1 refills | Status: DC

## 2020-06-24 NOTE — Patient Instructions (Signed)
We will follow up on the PA for [facility] Otherwise, stay on advair  Start on chantix

## 2020-06-24 NOTE — Assessment & Plan Note (Signed)
Resolved but given dysphagia, he will continue on swallow precautions as recommended by speech therapist

## 2020-06-24 NOTE — Progress Notes (Signed)
   Subjective:    Patient ID: James [name], male    DOB: 1956/01/25, 64 y.o.   MRN: [id number]  HPI  73 yoWith IDDM on insulin pump for follow-up of chronic asthmatic bronchitis/ bronchiectasis and recurrent aspiration pneumonia  Hequit smoking 03/2015 He underwent C-spine surgery 05/2016 and was noted to have moderate aspiration riskona swallow study 01/2017  PMhx >> HLD, Anxiety/depression, Spinal stenosis, CAD,IDDMwith retinopathy on insulin pump, CKD   06/2019 hospitalized for 10 days for asthma exacerbation treated with prednisone taper and Levaquin.  10/2019 admitted for asthma exacerbation, esophagram showed pharyngeal dysphagia with aspiration of barium outpatient swallow evaluation  5/9 fall with extensive left chest pain, feels likerib fractures. 5/14 hospitalized for left lower lobe pneumonia noted on HRCT which is ordered as outpatient  Speech evaluation showed dysphagia>> he take swallowing precautions. He underwent cardiac evaluation>> echo showed normal LV function, cardiac cath showed one-vessel disease 75% LAD, felt to have microvascular ischemia and calcium channel blocker prescribed but he has not started this yet. He continues to smoke about half pack per [place], Chantix has worked in the past.  He was unable to get [place] due to insurance issues, has been taking Advair, restarted prior authorization for this  Reviewed results of low-dose CT today  Significant tests/ events reviewed LDCT 06/2020 >> 3 mm nodules  Swallow eval >>pharyngeal dysphagia due to incomplete epiglottic inversion over the laryngeal vestibule as well as impaired timing of laryngeal vestibule closure,Presence of ADCF hardware may contribute.A mendelsohn maneuver - in which pt elevates larynx and maintains it in that position to lengthen duration of LVC - led to decreased quantity of aspiration  HRCT chest 5/2021bilateral lower lobe bronchiectasis, no ILD,New airspace  consolidation in the left lower lobe,Tracheobronchomalacia. Aortic atherosclerosis  Esophagram 10/31/19 >>Frank tracheal aspiration of thick barium during the pharyngeal phase of swallowing, causing cough response,Widely patent distal esophageal mucosal ring  Spirometry 2012 >> FEV1 1.94 (52%) PFTs 05/2015 ratio 74, FEV1 72%, no BD response, mild restriction  PFTs 09/2018 -ratio 75, FEV1 70%, FVC 70%, no bronchodilator response  CT sinus 08/2013 neg  Ct chest 01/2014 - left volume lossand consolidation left lower lobe-rib fractures CT chest 2017-improved aeration left lung,? Mild bronchiectasis  Review of Systems neg for any significant sore throat, dysphagia, itching, sneezing, nasal congestion or excess/ purulent secretions, fever, chills, sweats, unintended wt loss, pleuritic or exertional cp, hempoptysis, orthopnea pnd or change in chronic leg swelling. Also denies presyncope, palpitations, heartburn, abdominal pain, nausea, vomiting, diarrhea or change in bowel or urinary habits, dysuria,hematuria, rash, arthralgias, visual complaints, headache, numbness weakness or ataxia.     Objective:   Physical Exam  Gen. Pleasant, well-nourished, in no distress ENT - no thrush, no pallor/icterus,no post nasal drip Neck: No JVD, no thyromegaly, no carotid bruits Lungs: no use of accessory muscles, no dullness to percussion, clear without rales or rhonchi  Cardiovascular: Rhythm regular, heart sounds  normal, no murmurs or gallops, no peripheral edema Musculoskeletal: No deformities, no cyanosis or clubbing        Assessment & Plan:

## 2020-06-24 NOTE — Assessment & Plan Note (Signed)
Smoking cessation again emphasized is the most important intervention. We will send prescription for Chantix starter and continuation pack

## 2020-06-24 NOTE — Assessment & Plan Note (Addendum)
In the past, we have tried him on Symbicort and Advair and these have not been effective.  He received a letter from the hospital and this worked wonders for him.  Unfortunately insurance has not approved Dulera Again we will restart PA for [facility] but if he is unable to obtain this, I have emphasized to him that he should keep taking Advair

## 2020-06-26 NOTE — Progress Notes (Signed)
Please call patient and let them  know their  low dose Ct was read as a Lung RADS 2: nodules that are benign in appearance and behavior with a very low likelihood of becoming a clinically active cancer due to size or lack of growth. Recommendation per radiology is for a repeat LDCT in 12 months. .Please let them  know we will order and schedule their  annual screening scan for 06/2021. Please let them  know there was notation of CAD on their  scan.  Please remind the patient  that this is a non-gated exam therefore degree or severity of disease  cannot be determined. Please have them  follow up with their PCP regarding potential risk factor modification, dietary therapy or pharmacologic therapy if clinically indicated. Pt.  is  currently on statin therapy. Please place order for annual  screening scan for  06/2021 and fax results to PCP. Thanks so much. 

## 2020-06-26 NOTE — Telephone Encounter (Signed)
Fax for additional information needed for pt's [facility] PA was found in the PA file up front. Additional information about pt's PA has been added and this has been faxed to [employer] for pt.

## 2020-06-27 MED FILL — SIMVASTATIN 40 MG TABLET: 40 | 90 days supply | Qty: 90 | Fill #0

## 2020-06-27 MED FILL — FUROSEMIDE 20 MG TABS: 20 | 30 days supply | Qty: 30 | Fill #0

## 2020-06-27 MED FILL — DILTIAZEM HCL ER COATED BEA: 180 | 90 days supply | Qty: 90 | Fill #0

## 2020-06-30 ENCOUNTER — Other Ambulatory Visit: Payer: Self-pay | Admitting: *Deleted

## 2020-06-30 DIAGNOSIS — F17210 Nicotine dependence, cigarettes, uncomplicated: Secondary | ICD-10-CM

## 2020-06-30 DIAGNOSIS — Z87891 Personal history of nicotine dependence: Secondary | ICD-10-CM

## 2020-07-01 ENCOUNTER — Ambulatory Visit: Payer: 59 | Admitting: Cardiology

## 2020-07-01 MED FILL — ADVAIR 100/50 DISKUS: 100-50 | 30 days supply | Qty: 60 | Fill #1

## 2020-07-09 ENCOUNTER — Other Ambulatory Visit: Payer: Self-pay | Admitting: Pulmonary Disease

## 2020-07-09 MED FILL — ALBUTEROL SULFATE (2.5 MG/3: (2.5 MG/3ML | 15 days supply | Qty: 180 | Fill #0

## 2020-07-17 ENCOUNTER — Other Ambulatory Visit: Payer: 59

## 2020-07-17 DIAGNOSIS — R0602 Shortness of breath: Secondary | ICD-10-CM | POA: Diagnosis not present

## 2020-07-21 DIAGNOSIS — E1029 Type 1 diabetes mellitus with other diabetic kidney complication: Secondary | ICD-10-CM | POA: Diagnosis not present

## 2020-07-21 DIAGNOSIS — E1065 Type 1 diabetes mellitus with hyperglycemia: Secondary | ICD-10-CM | POA: Diagnosis not present

## 2020-07-23 MED FILL — APO-VARENICLINE 1 MG TABS: 1 | 28 days supply | Qty: 56 | Fill #4

## 2020-07-26 ENCOUNTER — Other Ambulatory Visit: Payer: Self-pay | Admitting: Internal Medicine

## 2020-07-26 ENCOUNTER — Telehealth: Payer: Self-pay | Admitting: Internal Medicine

## 2020-07-26 MED ORDER — AZITHROMYCIN 250 MG PO TABS: ORAL_TABLET | ORAL | 0 refills | Status: DC

## 2020-07-26 MED ORDER — PREDNISONE 10 MG PO TABS: ORAL_TABLET | ORAL | 0 refills | Status: DC

## 2020-07-26 MED FILL — predniSONE 10 MG TABS: 10 | 6 days supply | Qty: 14 | Fill #0

## 2020-07-26 MED FILL — AZITHROMYCIN 250 MG TABLET: 250 | 5 days supply | Qty: 6 | Fill #0

## 2020-07-26 NOTE — Telephone Encounter (Signed)
Worse x one week > off white mucus, very congested/ rattling   mucinex  Helps some  400mg  twice    Neb albuterol helps some, only used once or twice    rec  Prednisone 10 mg take  4 each am x 2 days,   2 each am x 2 days,  1 each am x 2 days and stop   Zpak   mucinex dm up to 1200 mg every 12 hours as needed  Albuterol neb up every 4 hours as needed  And add hypertonic saline if needed also   Hold advair at least for 2 days due to cough then consider alternatives that don't cause cough

## 2020-07-28 ENCOUNTER — Telehealth: Payer: Self-pay | Admitting: Pulmonary Disease

## 2020-07-28 NOTE — Telephone Encounter (Signed)
Pt called on call dr over the weekend, spoke to [facility], was given standard dosage of prednisone. Pt has had exacerbation of bronchitis, rattling in chest. Pt states RA usually gives heavier dose of prednisone. Pt did not take prednisone as directed. Took 6 10 mg the first [place], 4 pills the second Bishara and 3 the 3rd.  Please advise [phone]

## 2020-07-28 NOTE — Telephone Encounter (Signed)
Spoke with the pt  He states that he called over the wkend and spoke with Dr Melvyn Novas  He was having increased SOB, wheezing and congestion  He states that Dr Melvyn Novas called in pred but he did not take it as directed b/c he prefers a higher dose than what we called in  He took 60 mg first Tinch, then 40 mg second Haste and then 30 mg and now pred is gone  He states that he only felt better on the first Choy, when he took the 60 mg  He states that Dr. Elsworth Soho usually gives him a longer pred taper and he wants his recs on this  He is aware that Dr Elsworth Soho out of the office until 07/29/20 and wants to wait for his recs only  Please advise, thanks!

## 2020-07-29 ENCOUNTER — Other Ambulatory Visit: Payer: Self-pay | Admitting: Pulmonary Disease

## 2020-07-29 DIAGNOSIS — Z1152 Encounter for screening for COVID-19: Secondary | ICD-10-CM | POA: Diagnosis not present

## 2020-07-29 MED ORDER — PREDNISONE 10 MG PO TABS: ORAL_TABLET | ORAL | 0 refills | Status: DC

## 2020-07-29 MED FILL — predniSONE 10 MG TABS: 10 | 16 days supply | Qty: 40 | Fill #0

## 2020-07-29 NOTE — Telephone Encounter (Signed)
° °  Prednisone 10 mg tabs Take 4 tabs  daily with food x 4 days, then 3 tabs daily x 4 days, then 2 tabs daily x 4 days, then 1 tab daily x4 days then stop. #40 ° °

## 2020-07-29 NOTE — Telephone Encounter (Signed)
Called spoke with patient's wife per patient request.  Order placed for prednisone, patient was asking about getting a refill on the hydrocodone cough syrup.  I told her I would have to ask Dr. Elsworth Soho as he has to send that in himself. She voiced understanding.   Dr. Elsworth Soho please advise

## 2020-07-30 ENCOUNTER — Other Ambulatory Visit: Payer: Self-pay | Admitting: Pulmonary Disease

## 2020-07-30 MED ORDER — GUAIFENESIN-CODEINE 100-10 MG/5ML PO SYRP
5.0000 mL | ORAL_SOLUTION | Freq: Three times a day (TID) | ORAL | 0 refills | Status: DC | PRN
Start: 2020-07-30 — End: 2020-09-16

## 2020-07-30 MED FILL — GUAIATUSSIN AC LIQUID: 100-10 | 8 days supply | Qty: 120 | Fill #0

## 2020-07-30 NOTE — Telephone Encounter (Signed)
Sent cheratussin

## 2020-07-30 NOTE — Telephone Encounter (Signed)
Spoke with pt and informed him Dr. Elsworth Soho called in cheratussin for him. Pt stated understanding. Nothing further needed at this time.

## 2020-08-05 MED FILL — LEVOTHYROXINE SODIUM 100 MC: 100 | 90 days supply | Qty: 90 | Fill #2

## 2020-08-12 ENCOUNTER — Ambulatory Visit: Payer: 59 | Admitting: Cardiology

## 2020-08-15 ENCOUNTER — Other Ambulatory Visit: Payer: Self-pay | Admitting: Cardiology

## 2020-08-15 ENCOUNTER — Telehealth: Payer: 59 | Admitting: Cardiology

## 2020-08-15 ENCOUNTER — Encounter: Payer: Self-pay | Admitting: Cardiology

## 2020-08-15 ENCOUNTER — Other Ambulatory Visit: Payer: Self-pay

## 2020-08-15 VITALS — Ht 69.0 in | Wt 180.0 lb

## 2020-08-15 DIAGNOSIS — R0609 Other forms of dyspnea: Secondary | ICD-10-CM

## 2020-08-15 DIAGNOSIS — R0600 Dyspnea, unspecified: Secondary | ICD-10-CM

## 2020-08-15 DIAGNOSIS — Z794 Long term (current) use of insulin: Secondary | ICD-10-CM

## 2020-08-15 DIAGNOSIS — Z87891 Personal history of nicotine dependence: Secondary | ICD-10-CM

## 2020-08-15 DIAGNOSIS — I2510 Atherosclerotic heart disease of native coronary artery without angina pectoris: Secondary | ICD-10-CM

## 2020-08-15 DIAGNOSIS — E1059 Type 1 diabetes mellitus with other circulatory complications: Secondary | ICD-10-CM | POA: Diagnosis not present

## 2020-08-15 DIAGNOSIS — E782 Mixed hyperlipidemia: Secondary | ICD-10-CM | POA: Diagnosis not present

## 2020-08-15 DIAGNOSIS — I6522 Occlusion and stenosis of left carotid artery: Secondary | ICD-10-CM

## 2020-08-15 DIAGNOSIS — I2584 Coronary atherosclerosis due to calcified coronary lesion: Secondary | ICD-10-CM | POA: Diagnosis not present

## 2020-08-15 MED FILL — FUROSEMIDE 20 MG TABS: 20 | 30 days supply | Qty: 30 | Fill #0

## 2020-08-15 NOTE — Progress Notes (Signed)
James [name] Date of Birth: 09/05/1955 MRN: [id number] Primary Care Provider:South, James SeniorStephen, MD Primary Cardiologist: James LernerSunit Shimshon Narula, DO, [facility] (established care 04/16/2020)  Date: 08/15/20 Last Visit: 05/07/2020   Cardiology Video Visit via Telehealth Patient encounter was conducted via Video Visit with real-time audio and video communication.  The patient has been advised of the potential risks and limitations of this mode of treatment.  The patient has also been advised to contact this office for worsening conditions or problems, and seek emergency medical treatment and/or call 911 if the patient deems either necessary.  Patient Location: Home Provider Location: Office/Clinic  Chief Complaint  Patient presents with  . Coronary Artery Disease  . Follow-up    HPI  James [name] is a 65 y.o.  male evaluated today via a video visit with a chief complaint of " 5187-month follow-up management of coronary artery atherosclerosis." Patient's past medical history and cardiovascular risk factors include: Insulin-dependent diabetes mellitus type 1, hypothyroidism, former smoker (40-year pack history of smoking), hyperlipidemia,  coronary artery calcification, aortic atherosclerosis.  He is referred to the office at the request of James [name], Stephen, MD for evaluation of coronary artery calcification atherosclerosis of the aorta.  Patient is accompanied by his wife James [name] at today's virtual visit.  In May 2021, patient had a CTA of the chest and was found to have incidental coronary artery calcification and multiple coronary arteries was referred to cardiology for further evaluation and management.  Given his symptoms and multiple cardiovascular risk factors that shared decision was to proceed with left heart catheterization to rule out obstructive CAD.  Patient did undergo left heart catheterization and was found to have microvascular dysfunction and medical therapy was recommended.  At the last office  visit patient was started on diltiazem which he has tolerated well without any side effects or intolerances.  He was also given Lasix given his symptoms of dyspnea on exertion.  Patient has not started taking this medication.  Since last office visit patient states that he is remains asymptomatic.  No recent hospitalizations or urgent care visits.  3 weeks ago he has officially stopped smoking.  He currently is using Chantix.    ALLERGIES: Allergies  Allergen Reactions  . Augmentin [Amoxicillin-Pot Clavulanate] Nausea And Vomiting and Other (See Comments)    Did it involve swelling of the face/tongue/throat, SOB, or low BP? No Did it involve sudden or severe rash/hives, skin peeling, or any reaction on the inside of your mouth or nose? No Did you need to seek medical attention at a hospital or doctor's office? No When did it last happen?5+ years If all above answers are "NO", may proceed with cephalosporin use.   Judye Bos. Lexapro [Escitalopram Oxalate] Itching     MEDICATION LIST PRIOR TO VISIT: Current Outpatient Medications on File Prior to Visit  Medication Sig Dispense Refill  . acetaminophen (TYLENOL) 500 MG tablet Take 1,000 mg by mouth every 6 (six) hours as needed for mild pain or headache.    . ADVAIR DISKUS 100-50 MCG/DOSE AEPB Inhale 1 puff into the lungs 2 (two) times daily.    [name]. albuterol (PROVENTIL) (2.5 MG/3ML) 0.083% nebulizer solution USE 1 VIAL VIA NEBULIZER EVERY 6 HOURS AS NEEDED FOR WHEEZING OR SHORTNESS OF BREATH AND INHALE INTO LUNGS 180 mL 3  . albuterol (VENTOLIN HFA) 108 (90 Base) MCG/ACT inhaler INHALE 2 PUFFS BY MOUTH INTO THE LUNGS EVERY 6 HOURS AS NEEDED FOR WHEEZING OR SHORTNESS OF BREATH. 18 g 2  . aspirin  EC 81 MG tablet Take 1 tablet (81 mg total) by mouth daily. Swallow whole. (Patient taking differently: Take 81 mg by mouth at bedtime. Swallow whole.) 30 tablet 11  . CHANTIX STARTING MONTH PAK 0.5 MG X 11 & 1 MG X 42 tablet     . diltiazem (CARDIZEM CD)  180 MG 24 hr capsule Take 1 capsule (180 mg total) by mouth daily. 90 capsule 0  . finasteride (PROPECIA) 1 MG tablet Take 1 mg by mouth every evening.    [name] guaiFENesin-codeine (ROBITUSSIN AC) 100-10 MG/5ML syrup Take 5 mLs by mouth 3 (three) times daily as needed for cough. 120 mL 0  . ibuprofen (ADVIL) 200 MG tablet Take 800 mg by mouth every 8 (eight) hours as needed (pain.).    [name] insulin lispro (HUMALOG) 100 UNIT/ML KwikPen Junior Inject 0.05 mLs (5 Units total) into the skin 3 (three) times daily before meals. 3 mL 0  . Insulin Pen Needle 31G X 5 MM MISC 1 Device by Does not apply route 4 (four) times daily. For use with insulin pens 100 each 0  . levothyroxine (SYNTHROID) 100 MCG tablet Take 100 mcg by mouth every evening.    [name] MINOXIDIL EX Apply 1 application topically daily. For hair loss    . mometasone-formoterol (DULERA) 100-5 MCG/ACT AERO Inhale 2 puffs into the lungs in the morning and at bedtime. 13 g 3  . nicotine (NICODERM CQ - DOSED IN MG/24 HOURS) 21 mg/24hr patch     . NON FORMULARY Take 100 mg by mouth daily. hyaluronic acid    . PFIZER-BIONTECH COVID-19 VACC 30 MCG/0.3ML injection     . polyvinyl alcohol (LIQUIFILM TEARS) 1.4 % ophthalmic solution Place 1 drop into both eyes as needed for dry eyes.    . predniSONE (DELTASONE) 10 MG tablet Take  4 each am x 2 days,   2 each am x 2 days,  1 each am x 2 days and stop 14 tablet 0  . promethazine (PHENERGAN) 25 MG tablet Take 25 mg by mouth every 8 (eight) hours as needed for nausea or vomiting.     . sodium chloride HYPERTONIC 3 % nebulizer solution Take by nebulization 2 (two) times daily. 240 mL 3  . traZODone (DESYREL) 150 MG tablet Take 150 mg by mouth at bedtime.  5  . TURMERIC CURCUMIN PO Take 1 capsule by mouth daily.    . varenicline (CHANTIX CONTINUING MONTH PAK) 1 MG tablet Take 1 tablet (1 mg total) by mouth 2 (two) times daily. 60 tablet 1  . varenicline (CHANTIX PAK) 0.5 MG X 11 & 1 MG X 42 tablet Take one 0.5 mg  tablet by mouth once daily for 3 days, then increase to one 0.5 mg tablet twice daily for 4 days, then increase to one 1 mg tablet twice daily. 53 tablet 0  . azithromycin (ZITHROMAX) 250 MG tablet Take 2 on [place] one then 1 daily x 4 days 6 tablet 0  . simvastatin (ZOCOR) 40 MG tablet Take 40 mg by mouth at bedtime.   5   No current facility-administered medications on file prior to visit.   Pre-visit medications were reviewed and reconciled by rooming nurse, and independently reviewed and reconciled by me at the onset of this encounter. Any new changes in medication names or instructions are found in Final Medication List  PAST MEDICAL HISTORY: Past Medical History:  Diagnosis Date  . ANXIETY 04/03/2007  . Anxiety   . ASTHMA 09/06/2008  .  Asthma   . ASTHMATIC BRONCHITIS, ACUTE 10/25/2008  . Bladder neck obstruction   . CARPAL TUNNEL SYNDROME, BILATERAL 07/31/2007   issues resolved, no surgery  . Cervical disc disease   . Chronic bronchitis ([place])    "get it about q yr" (02/12/2014)  . COPD (chronic obstructive pulmonary disease) ([place])   . CORONARY ARTERY DISEASE 04/03/2007  . DEPRESSION 04/03/2007  . Depression   . DIABETES MELLITUS, TYPE I 04/03/2007  . Diabetic retinopathy associated with diabetes mellitus due to underlying condition ([place]) 04/03/2007  . DM W/EYE MANIFESTATIONS, TYPE I, UNCONTROLLED 04/04/2007  . 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 ED (erectile dysfunction)   . History of kidney stones   . HYPERLIPIDEMIA 04/04/2007  . Pneumonia    "several times and again today" (02/13/2104)  . Renal insufficiency   . Seizures ([place])    "insulin seizure from time to time; none in the last couple years" (02/12/2014)  . Spinal stenosis     PAST SURGICAL HISTORY: Past Surgical History:  Procedure Laterality Date  . ANTERIOR CERVICAL DECOMP/DISCECTOMY FUSION  2000   "couple screws and a plate"  . ANTERIOR CERVICAL DECOMP/DISCECTOMY FUSION N/A 06/24/2016   Procedure:  ANTERIOR CERVICAL DECOMPRESSION/DISCECTOMY FUSION CERVICAL FOUR - CERVICAL FIVE, CERVICAL FIVE - CERVICAL SIX; REMOVAL TETHER CERVICAL PLATE;  Surgeon: Jovita Gamma, MD;  Location: [facility];  Service: Neurosurgery;  Laterality: N/A;  ANTERIOR CERVICAL DECOMPRESSION/DISCECTOMY FUSION CERVICAL FOUR - CERVICAL FIVE, CERVICAL FIVE - CERVICAL SIX; REMOVAL TETHER CERVICAL PLATE  . APPENDECTOMY    . BACK SURGERY    . CARDIAC CATHETERIZATION  1990's  . CATARACT EXTRACTION W/ INTRAOCULAR LENS  IMPLANT, BILATERAL Bilateral   . CYSTOSCOPY WITH RETROGRADE PYELOGRAM, URETEROSCOPY AND STENT PLACEMENT Bilateral 04/06/2013   Procedure: BILATERAL CYSTOSCOPY WITH RETROGRADE PYELOGRAMS, STENT PLACEMENTS AND LEFT URETEROSCOPY AND STONE REMOVAL;  Surgeon: Alexis Frock, MD;  Location: [facility];  Service: Urology;  Laterality: Bilateral;  . CYSTOSCOPY WITH STENT PLACEMENT Right 04/12/2013   Procedure: CYSTOSCOPY WITH STENT PLACEMENT;  Surgeon: Alexis Frock, MD;  Location: [facility];  Service: Urology;  Laterality: Right;  . CYSTOSCOPY/RETROGRADE/URETEROSCOPY Bilateral 04/12/2013   Procedure: CYSTOSCOPY/RETROGRADE/URETEROSCOPY;  Surgeon: Alexis Frock, MD;  Location: [facility];  Service: Urology;  Laterality: Bilateral;  RIGHT RETROGRADE   . HOLMIUM LASER APPLICATION Left 9/93/7169   Procedure: HOLMIUM LASER APPLICATION;  Surgeon: Alexis Frock, MD;  Location: [facility];  Service: Urology;  Laterality: Left;  . LEFT HEART CATH AND CORONARY ANGIOGRAPHY N/A 04/29/2020   Procedure: LEFT HEART CATH AND CORONARY ANGIOGRAPHY;  Surgeon: Nigel Mormon, MD;  Location[facility];  Service: Cardiovascular;  Laterality: N/A;  . LUMBAR LAMINECTOMY/DECOMPRESSION MICRODISCECTOMY N/A 04/20/2019   Procedure: Lumbar microdisectomy and decompression L5-S1 left;  Surgeon: Latanya Maudlin, MD;  Location: [facility];  Service: Orthopedics;  Laterality: N/A;  86min  . LYMPH NODE DISSECTION  ~ 1960   groin  . stress cardiolite  09/06/2002   . TONSILLECTOMY    . VITRECTOMY Bilateral     FAMILY HISTORY: The patient's family history includes Cancer in his mother; Diabetes in his brother; Heart disease in his brother; Stroke in his father.   SOCIAL HISTORY:  The patient  reports that he quit smoking about a year ago. His smoking use included cigarettes. He smoked 0.00 packs per [place]. He has never used smokeless tobacco. He reports that he does not drink alcohol and does not use drugs.  Review of Systems  Constitutional: Negative for chills and  fever.  HENT: Negative for hoarse voice and nosebleeds.   Eyes: Negative for discharge, double vision and pain.  Cardiovascular: Positive for dyspnea on exertion (chronic and stable). Negative for chest pain, claudication, leg swelling, near-syncope, orthopnea, palpitations, paroxysmal nocturnal dyspnea and syncope.  Respiratory: Negative for hemoptysis and shortness of breath.   Musculoskeletal: Negative for muscle cramps and myalgias.  Gastrointestinal: Negative for abdominal pain, constipation, diarrhea, hematemesis, hematochezia, melena, nausea and vomiting.  Neurological: Negative for dizziness and light-headedness.    PHYSICAL EXAM: The following examination entries are based on visual observation of the patient via telemedicine video: CONSTITUTIONAL: No acute distress by facial expression or demeanor during the telemedicine video.  SKIN: No apparent cyanosis, pallor, jaundice of the face by telemedicine video. HEAD: Normocephalic and atraumatic by telemedicine video.  EYES: No apparent scleral icterus by telemedicine video. MOUTH/THROAT: Lips do not appear to be dry during telemedicine video. NECK: No apparent JVD or thyromegaly noted on observation of neck during telemedicine video. LUNGS: No audible stridor or wheezing noted during telemedicine audio. CARDIOVASCULAR: Not examined during telemedicine video NEUROLOGIC: No apparent focal asymmetry of face noted during  telemedicine video PSYCHIATRIC: Normal mood and affect, normal behavior, cooperative during telemedicine video  CARDIAC DATABASE: EKG: 04/16/2020: Normal sinus rhythm, 67 bpm, normal axis, without underlying ischemia or injury pattern.    Echocardiogram: 04/22/2020: Left ventricle cavity is normal in size. Moderate concentric hypertrophy of the left ventricle. Normal global wall motion. Normal LV systolic function with EF 55%. Doppler evidence of grade I (impaired) diastolic dysfunction, normal LAP. Mild tricuspid regurgitation. No evidence of pulmonary hypertension.   Stress Testing:  No results found for this or any previous visit from the past 1095 days.  Heart Catheterization: LM: Normal LAD: 75% stenosis in mid small caliber distal LAD. TIMI II flow, only marginally improved after IC NTG 200 mg. LCx: Large dominant vessel with TIMI II flow, only marginally improved after IC NTG 200 mg RCA: Small non-dominant vessel with TIMI II flow Suspect microvascular dysfunction. Consider addition of calcium channel blocker.   Carotid artery duplex 40/98/1191:  Peak systolic velocities in the right bifurcation, internal, external and common carotid arteries are within normal limits. There is mild  heterogeneous plaque noted in the right carotid artery.  Stenosis in the left internal carotid artery (16-49%). Stenosis in the left external carotid artery (<50%). Heterogeneous plaque.  Antegrade right vertebral artery flow. Antegrade left vertebral artery flow.  Follow up in one year is appropriate if clinically indicated.   LABORATORY DATA: CBC Latest Ref Rng & Units 04/29/2020 04/22/2020 12/02/2019  WBC 3.4 - 10.8 x10E3/uL - 4.0 4.0  Hemoglobin 13.0 - 17.0 g/dL 14.3 15.8 12.8(L)  Hematocrit 39.0 - 52.0 % 42.0 46.1 35.9(L)  Platelets 150 - 450 x10E3/uL - 266 244    CMP Latest Ref Rng & Units 04/29/2020 04/29/2020 12/02/2019  Glucose 70 - 99 mg/dL 115(H) 114(H) 126(H)  BUN 8 - 23 mg/dL 16 14  19   Creatinine 0.61 - 1.24 mg/dL 0.80 0.86 0.80  Sodium 135 - 145 mmol/L 140 138 136  Potassium 3.5 - 5.1 mmol/L 4.3 4.5 4.3  Chloride 98 - 111 mmol/L 104 107 108  CO2 22 - 32 mmol/L - 23 18(L)  Calcium 8.9 - 10.3 mg/dL - 8.8(L) 8.0(L)  Total Protein 6.5 - 8.1 g/dL - - -  Total Bilirubin 0.3 - 1.2 mg/dL - - -  Alkaline Phos 38 - 126 U/L - - -  AST 15 - 41 U/L - - -  ALT 0 - 44 U/L - - -    Lipid Panel     Component Value Date/Time   CHOL 124 05/08/2010 1434   TRIG 38.0 05/08/2010 1434   HDL 54.20 05/08/2010 1434   CHOLHDL 2 05/08/2010 1434   VLDL 7.6 05/08/2010 1434   LDLCALC 62 05/08/2010 1434    Lab Results  Component Value Date   HGBA1C 6.5 (H) 10/26/2019   HGBA1C 6.9 (H) 07/06/2019   HGBA1C 6.5 (H) 04/17/2019   No components found for: NTPROBNP Lab Results  Component Value Date   TSH 3.18 05/08/2010   TSH 1.94 04/15/2009   TSH 3.87 11/16/2006    Cardiac Panel (last 3 results) No results for input(s): CKTOTAL, CKMB, TROPONINIHS, RELINDX in the last 72 hours.  IMPRESSION:    ICD-10-CM   1. Coronary atherosclerosis due to calcified coronary lesion of native artery  I25.10    I25.84   2. Dyspnea on exertion  R06.00   3. Asymptomatic stenosis of left carotid artery  I65.22   4. Type 1 diabetes mellitus with other circulatory complication (HCC)  123456   5. Long-term insulin use (HCC)  Z79.4   6. Former smoker  Z87.891   62. Mixed hyperlipidemia  E78.2      RECOMMENDATIONS: Euclides Mifsud Raabe is a 65 y.o. male whose past medical history and cardiovascular risk factors include: Insulin-dependent diabetes mellitus type 1, hypothyroidism, former smoker (40-year pack history of smoking), hyperlipidemia,  coronary artery calcification, aortic atherosclerosis.  Coronary atherosclerosis due to calcified coronary arteries due to microvascular dysfunction: Medications reconciled. No recurrence of chest pain or anginal symptoms. Educated him on the importance of glycemic  control and lipid management. Patient has stopped smoking as of 3 weeks ago.  Patient is congratulated on his efforts.  He is currently on Chantix.  Dyspnea on exertion: Multifactorial. Symptoms are relatively stable. Patient denies any symptoms of heart failure. May use as needed Lasix if he notices weight gain of more than 1 pound over 24 hours or more than 3 pounds over a week.   Continue to follow with pulmonary medicine.  Asymptomatic left carotid artery stenosis: Continue aspirin and statin therapy. Scheduled for carotid duplex in July 2022. Educated on being cognizant for left-sided vision changes and contralateral focal deficits.  If such symptoms arise he is requested to go to the nearest hospital via EMS.  Former smoker: Patient has stopped smoking approximately 3 weeks ago. Patient is congratulated on his efforts. He is currently on Chantix. We will reevaluate at the next visit.  Hyperlipidemia: Continue statin therapy.  Managed by PCP.    Insulin-dependent diabetes mellitus type 1: Currently managed by primary care provider  No changes were made during this encounter.  I will see the patient after his carotid duplex or sooner if needed.  FINAL MEDICATION LIST END OF ENCOUNTER: No orders of the defined types were placed in this encounter.   There are no discontinued medications.   Current Outpatient Medications:  .  acetaminophen (TYLENOL) 500 MG tablet, Take 1,000 mg by mouth every 6 (six) hours as needed for mild pain or headache., Disp: , Rfl:  .  ADVAIR DISKUS 100-50 MCG/DOSE AEPB, Inhale 1 puff into the lungs 2 (two) times daily., Disp: , Rfl:  .  albuterol (PROVENTIL) (2.5 MG/3ML) 0.083% nebulizer solution, USE 1 VIAL VIA NEBULIZER EVERY 6 HOURS AS NEEDED FOR WHEEZING OR SHORTNESS OF BREATH AND INHALE INTO LUNGS, Disp: 180 mL, Rfl: 3 .  albuterol (VENTOLIN HFA)  108 (90 Base) MCG/ACT inhaler, INHALE 2 PUFFS BY MOUTH INTO THE LUNGS EVERY 6 HOURS AS NEEDED FOR  WHEEZING OR SHORTNESS OF BREATH., Disp: 18 g, Rfl: 2 .  aspirin EC 81 MG tablet, Take 1 tablet (81 mg total) by mouth daily. Swallow whole. (Patient taking differently: Take 81 mg by mouth at bedtime. Swallow whole.), Disp: 30 tablet, Rfl: 11 .  CHANTIX STARTING MONTH PAK 0.5 MG X 11 & 1 MG X 42 tablet, , Disp: , Rfl:  .  diltiazem (CARDIZEM CD) 180 MG 24 hr capsule, Take 1 capsule (180 mg total) by mouth daily., Disp: 90 capsule, Rfl: 0 .  finasteride (PROPECIA) 1 MG tablet, Take 1 mg by mouth every evening., Disp: , Rfl:  .  guaiFENesin-codeine (ROBITUSSIN AC) 100-10 MG/5ML syrup, Take 5 mLs by mouth 3 (three) times daily as needed for cough., Disp: 120 mL, Rfl: 0 .  ibuprofen (ADVIL) 200 MG tablet, Take 800 mg by mouth every 8 (eight) hours as needed (pain.)., Disp: , Rfl:  .  insulin lispro (HUMALOG) 100 UNIT/ML KwikPen Junior, Inject 0.05 mLs (5 Units total) into the skin 3 (three) times daily before meals., Disp: 3 mL, Rfl: 0 .  Insulin Pen Needle 31G X 5 MM MISC, 1 Device by Does not apply route 4 (four) times daily. For use with insulin pens, Disp: 100 each, Rfl: 0 .  levothyroxine (SYNTHROID) 100 MCG tablet, Take 100 mcg by mouth every evening., Disp: , Rfl:  .  MINOXIDIL EX, Apply 1 application topically daily. For hair loss, Disp: , Rfl:  .  mometasone-formoterol (DULERA) 100-5 MCG/ACT AERO, Inhale 2 puffs into the lungs in the morning and at bedtime., Disp: 13 g, Rfl: 3 .  nicotine (NICODERM CQ - DOSED IN MG/24 HOURS) 21 mg/24hr patch, , Disp: , Rfl:  .  NON FORMULARY, Take 100 mg by mouth daily. hyaluronic acid, Disp: , Rfl:  .  PFIZER-BIONTECH COVID-19 VACC 30 MCG/0.3ML injection, , Disp: , Rfl:  .  polyvinyl alcohol (LIQUIFILM TEARS) 1.4 % ophthalmic solution, Place 1 drop into both eyes as needed for dry eyes., Disp: , Rfl:  .  predniSONE (DELTASONE) 10 MG tablet, Take  4 each am x 2 days,   2 each am x 2 days,  1 each am x 2 days and stop, Disp: 14 tablet, Rfl: 0 .  promethazine  (PHENERGAN) 25 MG tablet, Take 25 mg by mouth every 8 (eight) hours as needed for nausea or vomiting. , Disp: , Rfl:  .  sodium chloride HYPERTONIC 3 % nebulizer solution, Take by nebulization 2 (two) times daily., Disp: 240 mL, Rfl: 3 .  traZODone (DESYREL) 150 MG tablet, Take 150 mg by mouth at bedtime., Disp: , Rfl: 5 .  TURMERIC CURCUMIN PO, Take 1 capsule by mouth daily., Disp: , Rfl:  .  varenicline (CHANTIX CONTINUING MONTH PAK) 1 MG tablet, Take 1 tablet (1 mg total) by mouth 2 (two) times daily., Disp: 60 tablet, Rfl: 1 .  varenicline (CHANTIX PAK) 0.5 MG X 11 & 1 MG X 42 tablet, Take one 0.5 mg tablet by mouth once daily for 3 days, then increase to one 0.5 mg tablet twice daily for 4 days, then increase to one 1 mg tablet twice daily., Disp: 53 tablet, Rfl: 0 .  azithromycin (ZITHROMAX) 250 MG tablet, Take 2 on [place] one then 1 daily x 4 days, Disp: 6 tablet, Rfl: 0 .  furosemide (LASIX) 20 MG tablet, TAKE 1 TABLET BY  MOUTH EVERY MORNING, Disp: 30 tablet, Rfl: 0 .  simvastatin (ZOCOR) 40 MG tablet, Take 40 mg by mouth at bedtime. , Disp: , Rfl: 5  No orders of the defined types were placed in this encounter.  Total time spent: 32 minutes.   --Continue cardiac medications as reconciled in final medication list. --Return in about 9 months (around 05/19/2021). Or sooner if needed. --Continue follow-up with your primary care physician regarding the management of your other chronic comorbid conditions.  Patient's questions and concerns were addressed to his satisfaction. He voices understanding of the instructions provided during this encounter.   This note was created using a voice recognition software as a result there may be grammatical errors inadvertently enclosed that do not reflect the nature of this encounter. Every attempt is made to correct such errors.  Rex Kras, [place], [facility]  Pager: [phone] Office: [phone]

## 2020-08-18 MED FILL — APO-VARENICLINE 1 MG TABS: 1 | 28 days supply | Qty: 56 | Fill #5

## 2020-08-28 DIAGNOSIS — E103593 Type 1 diabetes mellitus with proliferative diabetic retinopathy without macular edema, bilateral: Secondary | ICD-10-CM | POA: Diagnosis not present

## 2020-08-28 DIAGNOSIS — I2510 Atherosclerotic heart disease of native coronary artery without angina pectoris: Secondary | ICD-10-CM | POA: Diagnosis not present

## 2020-08-28 DIAGNOSIS — Z794 Long term (current) use of insulin: Secondary | ICD-10-CM | POA: Diagnosis not present

## 2020-08-28 DIAGNOSIS — Z4681 Encounter for fitting and adjustment of insulin pump: Secondary | ICD-10-CM | POA: Diagnosis not present

## 2020-08-28 MED FILL — traZODone HCL 150 MG TABS: 150 | 90 days supply | Qty: 90 | Fill #1

## 2020-08-29 ENCOUNTER — Other Ambulatory Visit (HOSPITAL_COMMUNITY): Payer: Self-pay | Admitting: Endocrinology

## 2020-08-29 MED FILL — HUMALOG 100 UNITS/ML KWIKPE: 100 | 30 days supply | Qty: 30 | Fill #0

## 2020-09-01 ENCOUNTER — Other Ambulatory Visit (HOSPITAL_COMMUNITY): Payer: Self-pay | Admitting: Endocrinology

## 2020-09-01 MED FILL — UNIFINE PENTIPS 31GX3/16: 31G X 5 MM | 90 days supply | Qty: 400 | Fill #0

## 2020-09-16 ENCOUNTER — Encounter: Payer: Self-pay | Admitting: Pulmonary Disease

## 2020-09-16 ENCOUNTER — Other Ambulatory Visit: Payer: Self-pay | Admitting: Pulmonary Disease

## 2020-09-16 ENCOUNTER — Ambulatory Visit: Payer: 59 | Admitting: Pulmonary Disease

## 2020-09-16 ENCOUNTER — Ambulatory Visit (INDEPENDENT_AMBULATORY_CARE_PROVIDER_SITE_OTHER): Payer: 59

## 2020-09-16 ENCOUNTER — Other Ambulatory Visit: Payer: Self-pay

## 2020-09-16 VITALS — BP 116/60 | HR 79 | Temp 98.1°F | Ht 69.0 in | Wt 184.2 lb

## 2020-09-16 DIAGNOSIS — J4489 Other specified chronic obstructive pulmonary disease: Secondary | ICD-10-CM

## 2020-09-16 DIAGNOSIS — J471 Bronchiectasis with (acute) exacerbation: Secondary | ICD-10-CM | POA: Diagnosis not present

## 2020-09-16 DIAGNOSIS — J449 Chronic obstructive pulmonary disease, unspecified: Secondary | ICD-10-CM

## 2020-09-16 DIAGNOSIS — J189 Pneumonia, unspecified organism: Secondary | ICD-10-CM

## 2020-09-16 DIAGNOSIS — Z87891 Personal history of nicotine dependence: Secondary | ICD-10-CM

## 2020-09-16 DIAGNOSIS — J690 Pneumonitis due to inhalation of food and vomit: Secondary | ICD-10-CM | POA: Diagnosis not present

## 2020-09-16 MED ORDER — IPRATROPIUM-ALBUTEROL 0.5-2.5 (3) MG/3ML IN SOLN: RESPIRATORY_TRACT | 3 refills | Status: DC

## 2020-09-16 MED FILL — IPRAT-ALBUT 0.5-3(2.5) MG/3: 0.5-2.5 (3) | 60 days supply | Qty: 360 | Fill #0

## 2020-09-16 NOTE — Progress Notes (Signed)
   Subjective:    Patient ID: James [name], male    DOB: 01-26-1956, 65 y.o.   MRN: [id number]  HPI  7 yosmoker for follow-up of chronic asthmatic bronchitis/ bronchiectasis and recurrent aspiration pneumonia  Hequit smoking 03/2015 PMH -C-spine surgery 05/2016 and was noted to have moderate aspiration riskona swallow study 01/2017 HLD, Anxiety/depression, Spinal stenosis, CAD,IDDMwith retinopathy on insulin pump, CKD -cardiac evaluation>> echo showed normal LV function, cardiac cath showed one-vessel disease 75% LAD, felt to have microvascular ischemia >>diltiazem   06/2019 hospitalized for 10 days for asthma exacerbation treated with prednisone taper and Levaquin.  10/2019 admitted for asthma exacerbation, esophagram showed pharyngeal dysphagia with aspiration of barium outpatient swallow evaluation  5/9 fall with extensive left chest pain, feels likerib fractures. 5/14 hospitalized for left lower lobe pneumonia   He was unable to get [facility] due to insurance issues, has been taking Advair, Symbicort was very expensive  Chief Complaint  Patient presents with  . Follow-up    Increased shortness of breath, productive cough with green phlegm   He complains of increased shortness of breath. He called for increased green sputum production 07/28/2020 and was given prednisone, Z-Pak followed by Cheratussin cough syrup He continues to complain of minimal green sputum and headaches.  He was unable to obtain [facility] and is now on Advair.  He is taking albuterol nebs about once daily  He has been able to quit smoking with varenicline which he takes once daily  Significant tests/ events reviewed LDCT 06/2020 >> 3 mm nodules  Swallow eval >>pharyngeal dysphagia due to incomplete epiglottic inversion over the laryngeal vestibule as well as impaired timing of laryngeal vestibule closure,Presence of ADCF hardware may contribute.A mendelsohn maneuver - in which pt elevates larynx  and maintains it in that position to lengthen duration of LVC - led to decreased quantity of aspiration  HRCT chest 5/2021bilateral lower lobe bronchiectasis, no ILD,New airspace consolidation in the left lower lobe,Tracheobronchomalacia. Aortic atherosclerosis  Esophagram 10/31/19 >>Frank tracheal aspiration of thick barium during the pharyngeal phase of swallowing, causing cough response,Widely patent distal esophageal mucosal ring  Spirometry 2012 >> FEV1 1.94 (52%) PFTs 05/2015 ratio 74, FEV1 72%, no BD response, mild restriction  PFTs 09/2018 -ratio 75, FEV1 70%, FVC 70%, no bronchodilator response  CT sinus 08/2013 neg  Ct chest 01/2014 - left volume lossand consolidation left lower lobe-rib fractures CT chest 2017-improved aeration left lung,? Mild bronchiectasis  Review of Systems neg for any significant sore throat, dysphagia, itching, sneezing, nasal congestion or excess/ purulent secretions, fever, chills, sweats, unintended wt loss, pleuritic or exertional cp, hempoptysis, orthopnea pnd or change in chronic leg swelling. Also denies presyncope, palpitations, heartburn, abdominal pain, nausea, vomiting, diarrhea or change in bowel or urinary habits, dysuria,hematuria, rash, arthralgias, visual complaints, headache, numbness weakness or ataxia.     Objective:   Physical Exam  Gen. Pleasant, well-nourished, in no distress ENT - no thrush, no pallor/icterus,no post nasal drip Neck: No JVD, no thyromegaly, no carotid bruits Lungs: no use of accessory muscles, no dullness to percussion, LT basal rales no rhonchi  Cardiovascular: Rhythm regular, heart sounds  normal, no murmurs or gallops, no peripheral edema Musculoskeletal: No deformities, no cyanosis or clubbing        Assessment & Plan:

## 2020-09-16 NOTE — Patient Instructions (Signed)
  CXR today to r/o pneumonia  Stay on advair Rx for duonebs to take in place of albuterol 1-2 times daily & as needed  Congratulations on quitting smoking

## 2020-09-16 NOTE — Assessment & Plan Note (Addendum)
He still reports some dysphagia and coughing after he eats or drinks I suspect he still microaspiration's. We will obtain chest x-ray today  -Independently reviewed chest x-ray no evidence of pneumonia, does not need antibiotics at present

## 2020-09-16 NOTE — Assessment & Plan Note (Signed)
Continue Advair twice daily, PA for [facility] was not approved. We will add Atrovent nebs to albuterol and I have asked him to take this twice daily

## 2020-09-16 NOTE — Assessment & Plan Note (Signed)
Continue varenicline once daily

## 2020-09-17 ENCOUNTER — Telehealth: Payer: Self-pay | Admitting: Pulmonary Disease

## 2020-09-17 NOTE — Telephone Encounter (Signed)
Left message for patient to call back. CXR results are not available yet.

## 2020-09-17 NOTE — Assessment & Plan Note (Signed)
Emphasized airway clearance measures

## 2020-09-18 ENCOUNTER — Other Ambulatory Visit: Payer: Self-pay | Admitting: Pulmonary Disease

## 2020-09-18 ENCOUNTER — Telehealth: Payer: Self-pay | Admitting: Pulmonary Disease

## 2020-09-18 MED ORDER — AMOXICILLIN-POT CLAVULANATE 875-125 MG PO TABS: 1.0000 | ORAL_TABLET | Freq: Every day | ORAL | 0 refills | Status: DC

## 2020-09-18 MED ORDER — PREDNISONE 20 MG PO TABS: 40.0000 mg | ORAL_TABLET | Freq: Every day | ORAL | 0 refills | Status: DC

## 2020-09-18 MED FILL — CARESTART COVID-19 HOME TES: 4 days supply | Qty: 4 | Fill #0

## 2020-09-18 MED FILL — APO-VARENICLINE 1 MG TABS: 1 | 30 days supply | Qty: 60 | Fill #0

## 2020-09-18 MED FILL — predniSONE 20 MG TABS: 20 | 5 days supply | Qty: 10 | Fill #0

## 2020-09-18 MED FILL — AMOX TR-K CLV 875-125 MG TA: 875-125 | 7 days supply | Qty: 14 | Fill #0

## 2020-09-18 NOTE — Telephone Encounter (Signed)
Call returned to [facility], confirmed patient DOB. Made aware augmentin is for twice daily. Voiced understanding.   Nothing further needed at this time.

## 2020-09-18 NOTE — Telephone Encounter (Signed)
Patient states had shortness of breath last night. Used Nebulizer machine. Would like Prednisone called into pharmacy. Pharmacy is [facility]. Patient phone number is [phone].

## 2020-09-18 NOTE — Progress Notes (Signed)
See telephone note from today, 09/18/2020 where it is stated that patient made aware of xray result. Nothing further needed at this time.

## 2020-09-18 NOTE — Telephone Encounter (Signed)
Call made to patient, confirmed DOB. Made aware of CXR results. Voiced understanding. He voices last night he had an episode of SOB. He reports his bronchial tubes shut down. He reports he used his nebulizer and albuterol inhaler and this helped. He reports if it did not work he was going to the ED. Reports a productive cough with yellow mucous. Denies chest pain or any acute distress. Denies fever. He has not recently been tested for covid. He is requesting an antibiotic.   JD please advise. Thanks :)

## 2020-09-18 NOTE — Telephone Encounter (Signed)
Please send in 7 days of augmentin and 5 days of prednisone 40mg .   Thanks, Wille Glaser

## 2020-09-18 NOTE — Telephone Encounter (Signed)
Twice daily.

## 2020-09-18 NOTE — Telephone Encounter (Signed)
Dr Erin Fulling did you want the augmentin for once daly or twice daily?  Thanks :)

## 2020-09-18 NOTE — Telephone Encounter (Signed)
Call returned to patient, confirmed DOB. Confirmed pharmacy. Aware medication has been sent to pharmacy. Voiced understanding.   Nothing further needed at this time.

## 2020-09-22 ENCOUNTER — Other Ambulatory Visit: Payer: Self-pay | Admitting: Pulmonary Disease

## 2020-09-22 ENCOUNTER — Telehealth: Payer: Self-pay | Admitting: Pulmonary Disease

## 2020-09-22 MED ORDER — PREDNISONE 10 MG PO TABS: ORAL_TABLET | ORAL | 0 refills | Status: DC

## 2020-09-22 MED FILL — predniSONE 10 MG TABS: 10 | 16 days supply | Qty: 40 | Fill #0

## 2020-09-22 NOTE — Telephone Encounter (Signed)
Longer course of prednisone Prednisone 10 mg tabs Take 4 tabs  daily with food x 4 days, then 3 tabs daily x 4 days, then 2 tabs daily x 4 days, then 1 tab daily x4 days then stop. #40 Delsym for cough 68ml tid prn

## 2020-09-22 NOTE — Telephone Encounter (Signed)
Called and spoke with patient. He stated that he took his last dose of 40mg  of prednisone this morning. He still has a few days left of the amoxicillin. He has developed a cough with clear,thick phlegm. He has also felt more SOB since Friday. He has been checking his O2 at home and he has been running 90%-94%. He started wheezing over the weekend. With the cough and wheezing, these are new symptoms since he was not coughing nor wheezing when he came in on 09/16/20.   He has been using the DuoNebs twice daily in addition to the albuterol HFA at least twice daily.   He is requesting additional prednisone as well as something for his cough.   Pharmacy is [facility].   RA, can you please advise? Thanks!

## 2020-09-22 NOTE — Telephone Encounter (Signed)
Called and spoke with patient to let him know we are sending in longer course of prednisone and for him to try Delsym for his cough. He expressed understanding and verified pharmacy. RX has been sent. Nothing further needed at this time.

## 2020-09-25 ENCOUNTER — Other Ambulatory Visit (HOSPITAL_COMMUNITY): Payer: Self-pay | Admitting: Endocrinology

## 2020-09-26 ENCOUNTER — Other Ambulatory Visit: Payer: Self-pay | Admitting: Pulmonary Disease

## 2020-09-26 ENCOUNTER — Other Ambulatory Visit: Payer: Self-pay | Admitting: Primary Care

## 2020-09-26 MED FILL — ADVAIR 100/50 DISKUS: 100-50 | 30 days supply | Qty: 60 | Fill #0

## 2020-09-29 ENCOUNTER — Other Ambulatory Visit: Payer: Self-pay | Admitting: Cardiology

## 2020-09-29 DIAGNOSIS — I2584 Coronary atherosclerosis due to calcified coronary lesion: Secondary | ICD-10-CM

## 2020-09-29 DIAGNOSIS — R0600 Dyspnea, unspecified: Secondary | ICD-10-CM

## 2020-09-29 DIAGNOSIS — R0609 Other forms of dyspnea: Secondary | ICD-10-CM

## 2020-09-29 DIAGNOSIS — I2510 Atherosclerotic heart disease of native coronary artery without angina pectoris: Secondary | ICD-10-CM

## 2020-10-01 ENCOUNTER — Other Ambulatory Visit: Payer: Self-pay

## 2020-10-01 ENCOUNTER — Other Ambulatory Visit: Payer: Self-pay | Admitting: Cardiology

## 2020-10-01 DIAGNOSIS — R0600 Dyspnea, unspecified: Secondary | ICD-10-CM

## 2020-10-01 DIAGNOSIS — I2510 Atherosclerotic heart disease of native coronary artery without angina pectoris: Secondary | ICD-10-CM

## 2020-10-01 DIAGNOSIS — R0609 Other forms of dyspnea: Secondary | ICD-10-CM

## 2020-10-01 MED ORDER — FUROSEMIDE 20 MG PO TABS: 20.0000 mg | ORAL_TABLET | Freq: Every day | ORAL | 0 refills | Status: DC | PRN

## 2020-10-01 MED ORDER — DILTIAZEM HCL ER COATED BEADS 180 MG PO CP24: 180.0000 mg | ORAL_CAPSULE | Freq: Every day | ORAL | 0 refills | Status: DC

## 2020-10-09 ENCOUNTER — Other Ambulatory Visit (HOSPITAL_COMMUNITY): Payer: Self-pay | Admitting: Endocrinology

## 2020-10-09 MED FILL — METOCLOPRAMIDE 5 MG TABLET: 5 | 90 days supply | Qty: 180 | Fill #0

## 2020-10-14 DIAGNOSIS — E1065 Type 1 diabetes mellitus with hyperglycemia: Secondary | ICD-10-CM | POA: Diagnosis not present

## 2020-10-14 DIAGNOSIS — E1029 Type 1 diabetes mellitus with other diabetic kidney complication: Secondary | ICD-10-CM | POA: Diagnosis not present

## 2020-10-17 DIAGNOSIS — E785 Hyperlipidemia, unspecified: Secondary | ICD-10-CM | POA: Diagnosis not present

## 2020-10-17 DIAGNOSIS — Z0000 Encounter for general adult medical examination without abnormal findings: Secondary | ICD-10-CM | POA: Diagnosis not present

## 2020-10-17 DIAGNOSIS — E103593 Type 1 diabetes mellitus with proliferative diabetic retinopathy without macular edema, bilateral: Secondary | ICD-10-CM | POA: Diagnosis not present

## 2020-10-17 DIAGNOSIS — Z125 Encounter for screening for malignant neoplasm of prostate: Secondary | ICD-10-CM | POA: Diagnosis not present

## 2020-10-23 ENCOUNTER — Other Ambulatory Visit (HOSPITAL_COMMUNITY): Payer: Self-pay

## 2020-10-23 MED FILL — Varenicline Tartrate Tab 1 MG (Base Equiv): ORAL | 30 days supply | Qty: 60 | Fill #0 | Status: AC

## 2020-10-23 MED FILL — Fluticasone-Salmeterol Aer Powder BA 100-50 MCG/ACT: RESPIRATORY_TRACT | 30 days supply | Qty: 60 | Fill #0 | Status: AC

## 2020-10-28 ENCOUNTER — Other Ambulatory Visit (HOSPITAL_COMMUNITY): Payer: Self-pay

## 2020-10-28 DIAGNOSIS — E103593 Type 1 diabetes mellitus with proliferative diabetic retinopathy without macular edema, bilateral: Secondary | ICD-10-CM | POA: Diagnosis not present

## 2020-10-28 DIAGNOSIS — I700 Atherosclerosis of aorta: Secondary | ICD-10-CM | POA: Diagnosis not present

## 2020-10-28 DIAGNOSIS — J441 Chronic obstructive pulmonary disease with (acute) exacerbation: Secondary | ICD-10-CM | POA: Diagnosis not present

## 2020-10-28 DIAGNOSIS — Z1331 Encounter for screening for depression: Secondary | ICD-10-CM | POA: Diagnosis not present

## 2020-10-28 DIAGNOSIS — D649 Anemia, unspecified: Secondary | ICD-10-CM | POA: Diagnosis not present

## 2020-10-28 DIAGNOSIS — K3184 Gastroparesis: Secondary | ICD-10-CM | POA: Diagnosis not present

## 2020-10-28 DIAGNOSIS — I2510 Atherosclerotic heart disease of native coronary artery without angina pectoris: Secondary | ICD-10-CM | POA: Diagnosis not present

## 2020-10-28 DIAGNOSIS — Z0000 Encounter for general adult medical examination without abnormal findings: Secondary | ICD-10-CM | POA: Diagnosis not present

## 2020-10-28 DIAGNOSIS — I739 Peripheral vascular disease, unspecified: Secondary | ICD-10-CM | POA: Diagnosis not present

## 2020-10-28 DIAGNOSIS — Z794 Long term (current) use of insulin: Secondary | ICD-10-CM | POA: Diagnosis not present

## 2020-10-28 DIAGNOSIS — R82998 Other abnormal findings in urine: Secondary | ICD-10-CM | POA: Diagnosis not present

## 2020-10-28 DIAGNOSIS — Z1339 Encounter for screening examination for other mental health and behavioral disorders: Secondary | ICD-10-CM | POA: Diagnosis not present

## 2020-10-28 DIAGNOSIS — I6523 Occlusion and stenosis of bilateral carotid arteries: Secondary | ICD-10-CM | POA: Diagnosis not present

## 2020-10-28 MED ORDER — PANTOPRAZOLE SODIUM 40 MG PO TBEC
1.0000 | DELAYED_RELEASE_TABLET | Freq: Every day | ORAL | 3 refills | Status: DC
  Filled 2020-10-28: qty 90, 90d supply, fill #0
  Filled 2021-02-08: qty 90, 90d supply, fill #1
  Filled 2021-05-19: qty 90, 90d supply, fill #2
  Filled 2021-08-31: qty 90, 90d supply, fill #3

## 2020-10-28 MED ORDER — TAMSULOSIN HCL 0.4 MG PO CAPS
0.4000 mg | ORAL_CAPSULE | Freq: Every day | ORAL | 3 refills | Status: DC
  Filled 2020-10-28: qty 90, 90d supply, fill #0
  Filled 2021-01-31: qty 90, 90d supply, fill #1
  Filled 2021-05-11: qty 90, 90d supply, fill #2
  Filled 2021-08-17: qty 90, 90d supply, fill #3

## 2020-10-30 DIAGNOSIS — Z1212 Encounter for screening for malignant neoplasm of rectum: Secondary | ICD-10-CM | POA: Diagnosis not present

## 2020-11-11 ENCOUNTER — Other Ambulatory Visit (HOSPITAL_COMMUNITY): Payer: Self-pay

## 2020-11-11 MED FILL — Levothyroxine Sodium Tab 100 MCG: ORAL | 90 days supply | Qty: 90 | Fill #0 | Status: AC

## 2020-11-12 ENCOUNTER — Other Ambulatory Visit (HOSPITAL_COMMUNITY): Payer: Self-pay

## 2020-11-12 MED FILL — Insulin Lispro Subcutaneous Soln 100 Unit/ML: SUBCUTANEOUS | 90 days supply | Qty: 90 | Fill #0 | Status: CN

## 2020-11-13 ENCOUNTER — Other Ambulatory Visit (HOSPITAL_COMMUNITY): Payer: Self-pay

## 2020-11-13 MED FILL — Insulin Lispro Inj Soln 100 Unit/ML: INTRAMUSCULAR | 90 days supply | Qty: 90 | Fill #0 | Status: AC

## 2020-11-14 ENCOUNTER — Other Ambulatory Visit: Payer: Self-pay

## 2020-11-14 ENCOUNTER — Encounter (HOSPITAL_COMMUNITY): Payer: Self-pay

## 2020-11-14 ENCOUNTER — Emergency Department (HOSPITAL_COMMUNITY): Payer: 59

## 2020-11-14 ENCOUNTER — Emergency Department (HOSPITAL_COMMUNITY)
Admission: EM | Admit: 2020-11-14 | Discharge: 2020-11-15 | Disposition: A | Discharge status: Home / Self Care | Payer: 59 | Attending: Emergency Medicine | Admitting: Emergency Medicine

## 2020-11-14 DIAGNOSIS — E109 Type 1 diabetes mellitus without complications: Secondary | ICD-10-CM | POA: Diagnosis not present

## 2020-11-14 DIAGNOSIS — J45909 Unspecified asthma, uncomplicated: Secondary | ICD-10-CM | POA: Insufficient documentation

## 2020-11-14 DIAGNOSIS — R0781 Pleurodynia: Secondary | ICD-10-CM | POA: Diagnosis not present

## 2020-11-14 DIAGNOSIS — J449 Chronic obstructive pulmonary disease, unspecified: Secondary | ICD-10-CM | POA: Diagnosis not present

## 2020-11-14 DIAGNOSIS — Z7982 Long term (current) use of aspirin: Secondary | ICD-10-CM | POA: Insufficient documentation

## 2020-11-14 DIAGNOSIS — Y9389 Activity, other specified: Secondary | ICD-10-CM | POA: Diagnosis not present

## 2020-11-14 DIAGNOSIS — Z87891 Personal history of nicotine dependence: Secondary | ICD-10-CM | POA: Diagnosis not present

## 2020-11-14 DIAGNOSIS — Z794 Long term (current) use of insulin: Secondary | ICD-10-CM | POA: Insufficient documentation

## 2020-11-14 DIAGNOSIS — W19XXXA Unspecified fall, initial encounter: Secondary | ICD-10-CM | POA: Insufficient documentation

## 2020-11-14 DIAGNOSIS — I2510 Atherosclerotic heart disease of native coronary artery without angina pectoris: Secondary | ICD-10-CM | POA: Diagnosis not present

## 2020-11-14 DIAGNOSIS — S298XXA Other specified injuries of thorax, initial encounter: Secondary | ICD-10-CM

## 2020-11-14 DIAGNOSIS — S299XXA Unspecified injury of thorax, initial encounter: Secondary | ICD-10-CM | POA: Insufficient documentation

## 2020-11-14 DIAGNOSIS — Y92812 Truck as the place of occurrence of the external cause: Secondary | ICD-10-CM | POA: Diagnosis not present

## 2020-11-14 MED ORDER — HYDROCODONE-ACETAMINOPHEN 5-325 MG PO TABS
1.0000 | ORAL_TABLET | Freq: Once | ORAL | Status: AC
  Administered 2020-11-15: 1 via ORAL
  Filled 2020-11-14: qty 1

## 2020-11-14 MED ORDER — LIDOCAINE 5 % EX PTCH
1.0000 | MEDICATED_PATCH | Freq: Once | CUTANEOUS | Status: DC
  Administered 2020-11-14: 1 via TRANSDERMAL
  Filled 2020-11-14: qty 1

## 2020-11-14 MED ORDER — IBUPROFEN 200 MG PO TABS
400.0000 mg | ORAL_TABLET | Freq: Once | ORAL | Status: AC
  Administered 2020-11-15: 400 mg via ORAL
  Filled 2020-11-14: qty 2

## 2020-11-14 NOTE — ED Triage Notes (Signed)
Emergency Medicine Provider Triage Evaluation Note  James [name] , a 65 y.o. male  was evaluated in triage.  Pt complains of mechanical fall when trying to lift something heavy into truck tailgate around 730 PM tonight. He landed on right side in the grass. Pain worse with inspiration and ambulation. Took 2 advil prior to arrival. Not anticoagulated.   Review of Systems  Positive: arthralgia Negative: Shortness of breath, fever, numbness  Physical Exam  BP 132/71 (BP Location: Left Arm)   Pulse 86   Temp 98.2 F (36.8 C) (Oral)   Resp 16   Ht 5\' 9"  (1.753 m)   Wt 83.5 kg   SpO2 99%   BMI 27.17 kg/m  Gen:   Awake, no distress   HEENT:  Atraumatic  Resp:  Normal effort. Lungs Clear to auscultation in all fields. Cardiac:  Normal rate  Abd:   Nondistended, nontender  MSK:   Moves extremities without difficulty. Tender to palpation of right ribs and scapula. No crepitus or deformity. No ecchymosis Neuro:  Speech clear   Medical Decision Making  Medically screening exam initiated at 10:29 PM.  Appropriate orders placed.  Stoy Fenn Pehrson was informed that the remainder of the evaluation will be completed by another provider, this initial triage assessment does not replace that evaluation, and the importance of remaining in the ED until their evaluation is complete.  Clinical Impression  Fall with rib pain. Xray in process. Lidoderm patch ordered.   Barrie Folk, PA-C 11/14/20 2237

## 2020-11-14 NOTE — ED Triage Notes (Signed)
Pt reports fall at home landing on right side of ribs. C/o right rib pain and shob on inspiration.

## 2020-11-15 ENCOUNTER — Other Ambulatory Visit (HOSPITAL_COMMUNITY): Payer: Self-pay

## 2020-11-15 DIAGNOSIS — Z7982 Long term (current) use of aspirin: Secondary | ICD-10-CM | POA: Diagnosis not present

## 2020-11-15 DIAGNOSIS — J45909 Unspecified asthma, uncomplicated: Secondary | ICD-10-CM | POA: Diagnosis not present

## 2020-11-15 DIAGNOSIS — E109 Type 1 diabetes mellitus without complications: Secondary | ICD-10-CM | POA: Diagnosis not present

## 2020-11-15 DIAGNOSIS — Z794 Long term (current) use of insulin: Secondary | ICD-10-CM | POA: Diagnosis not present

## 2020-11-15 DIAGNOSIS — Z87891 Personal history of nicotine dependence: Secondary | ICD-10-CM | POA: Diagnosis not present

## 2020-11-15 DIAGNOSIS — I2510 Atherosclerotic heart disease of native coronary artery without angina pectoris: Secondary | ICD-10-CM | POA: Diagnosis not present

## 2020-11-15 DIAGNOSIS — S299XXA Unspecified injury of thorax, initial encounter: Secondary | ICD-10-CM | POA: Diagnosis not present

## 2020-11-15 DIAGNOSIS — J449 Chronic obstructive pulmonary disease, unspecified: Secondary | ICD-10-CM | POA: Diagnosis not present

## 2020-11-15 MED ORDER — OXYCODONE-ACETAMINOPHEN 5-325 MG PO TABS
1.0000 | ORAL_TABLET | Freq: Three times a day (TID) | ORAL | 0 refills | Status: DC | PRN
  Filled 2020-11-15: qty 10, 4d supply, fill #0

## 2020-11-15 NOTE — ED Provider Notes (Signed)
[facility] DEPT Provider Note   CSN: [id number] Arrival date & time: 11/14/20  2121     History Chief Complaint  Patient presents with  . Rib Injury    James [name] is a 65 y.o. male.  The history is provided by the patient.  Fall This is a new problem. The problem occurs constantly. The problem has not changed since onset.Pertinent negatives include no chest pain, no abdominal pain, no headaches and no shortness of breath. Nothing aggravates the symptoms. Nothing relieves the symptoms.  Patient presents after mechanical fall.  He was trying to load heavy object in his truck and he fell on his right side.  Reports all the pain is in his right ribs No head injury.  No LOC.  No neck or back pain.  No shortness of breath.  No hemoptysis.  He does not take anticoagulation     Past Medical History:  Diagnosis Date  . ANXIETY 04/03/2007  . Anxiety   . ASTHMA 09/06/2008  . Asthma   . ASTHMATIC BRONCHITIS, ACUTE 10/25/2008  . Bladder neck obstruction   . CARPAL TUNNEL SYNDROME, BILATERAL 07/31/2007   issues resolved, no surgery  . Cervical disc disease   . Chronic bronchitis ([place])    "get it about q yr" (02/12/2014)  . COPD (chronic obstructive pulmonary disease) ([place])   . CORONARY ARTERY DISEASE 04/03/2007  . DEPRESSION 04/03/2007  . Depression   . DIABETES MELLITUS, TYPE I 04/03/2007  . Diabetic retinopathy associated with diabetes mellitus due to underlying condition ([place]) 04/03/2007  . DM W/EYE MANIFESTATIONS, TYPE I, UNCONTROLLED 04/04/2007  . 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 ED (erectile dysfunction)   . History of kidney stones   . HYPERLIPIDEMIA 04/04/2007  . Pneumonia    "several times and again today" (02/13/2104)  . Renal insufficiency   . Seizures ([place])    "insulin seizure from time to time; none in the last couple years" (02/12/2014)  . Spinal stenosis     Patient Active Problem List   Diagnosis Date Noted  .  Bronchiectasis with (acute) exacerbation ([place]) 12/06/2019  . Pneumonia 12/01/2019  . Medication management 11/09/2019  . Seizure disorder, secondary ([place]) 07/19/2019  . Abnormal CT of the chest 07/19/2019  . Acute asthma exacerbation 06/29/2019  . Asthma exacerbation 06/28/2019  . 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 Hypoglycemia due to insulin 04/20/2019  . DM type 1 (diabetes mellitus, type 1) ([place]) 04/20/2019  . HNP (herniated nucleus pulposus), cervical 06/24/2016  . Syncope 05/20/2016  . Tobacco abuse 05/20/2016  . Cervical disc disorder with radiculopathy of cervical region 05/20/2016  . Aspiration pneumonia ([place]) 02/12/2014  . Gastroparesis 02/10/2011  . Esophageal reflux 12/21/2010  . TOBACCO USE, QUIT 05/08/2010  . CONTUSION, RIGHT CHEST WALL 04/07/2010  . Chronic asthmatic bronchitis ([place]) 10/25/2008  . Asthma, persistent controlled 09/06/2008  . Cough 09/06/2008  . SHOULDER PAIN, RIGHT 08/19/2008  . BLADDER OUTLET OBSTRUCTION 04/15/2008  . RASH AND OTHER NONSPECIFIC SKIN ERUPTION 02/21/2008  . CARPAL TUNNEL SYNDROME, BILATERAL 07/31/2007  . Allergic rhinitis 07/31/2007  . RENAL INSUFFICIENCY 07/31/2007  . OTHER TENOSYNOVITIS OF HAND AND WRIST 07/31/2007  . 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 HLD (hyperlipidemia) 04/04/2007  . DIABETIC RETINOPATHY, PROLIFERATIVE 04/03/2007  . ANXIETY 04/03/2007  . DEPRESSION 04/03/2007  . 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 CAD (  coronary artery disease) 04/03/2007  . DYSHIDROSIS 04/03/2007    Past Surgical History:  Procedure Laterality Date  . ANTERIOR CERVICAL DECOMP/DISCECTOMY FUSION  2000   "couple screws and a plate"  . ANTERIOR CERVICAL DECOMP/DISCECTOMY FUSION N/A 06/24/2016   Procedure: ANTERIOR CERVICAL DECOMPRESSION/DISCECTOMY FUSION CERVICAL FOUR - CERVICAL  FIVE, CERVICAL FIVE - CERVICAL SIX; REMOVAL TETHER CERVICAL PLATE;  Surgeon: Jovita Gamma, MD;  Location: [facility];  Service: Neurosurgery;  Laterality: N/A;  ANTERIOR CERVICAL DECOMPRESSION/DISCECTOMY FUSION CERVICAL FOUR - CERVICAL FIVE, CERVICAL FIVE - CERVICAL SIX; REMOVAL TETHER CERVICAL PLATE  . APPENDECTOMY    . BACK SURGERY    . CARDIAC CATHETERIZATION  1990's  . CATARACT EXTRACTION W/ INTRAOCULAR LENS  IMPLANT, BILATERAL Bilateral   . CYSTOSCOPY WITH RETROGRADE PYELOGRAM, URETEROSCOPY AND STENT PLACEMENT Bilateral 04/06/2013   Procedure: BILATERAL CYSTOSCOPY WITH RETROGRADE PYELOGRAMS, STENT PLACEMENTS AND LEFT URETEROSCOPY AND STONE REMOVAL;  Surgeon: Alexis Frock, MD;  Location: [facility];  Service: Urology;  Laterality: Bilateral;  . CYSTOSCOPY WITH STENT PLACEMENT Right 04/12/2013   Procedure: CYSTOSCOPY WITH STENT PLACEMENT;  Surgeon: Alexis Frock, MD;  Location: [facility];  Service: Urology;  Laterality: Right;  . CYSTOSCOPY/RETROGRADE/URETEROSCOPY Bilateral 04/12/2013   Procedure: CYSTOSCOPY/RETROGRADE/URETEROSCOPY;  Surgeon: Alexis Frock, MD;  Location: [facility];  Service: Urology;  Laterality: Bilateral;  RIGHT RETROGRADE   . HOLMIUM LASER APPLICATION Left 12/23/3708   Procedure: HOLMIUM LASER APPLICATION;  Surgeon: Alexis Frock, MD;  Location: [facility];  Service: Urology;  Laterality: Left;  . LEFT HEART CATH AND CORONARY ANGIOGRAPHY N/A 04/29/2020   Procedure: LEFT HEART CATH AND CORONARY ANGIOGRAPHY;  Surgeon: Nigel Mormon, MD;  Location: [facility];  Service: Cardiovascular;  Laterality: N/A;  . LUMBAR LAMINECTOMY/DECOMPRESSION MICRODISCECTOMY N/A 04/20/2019   Procedure: Lumbar microdisectomy and decompression L5-S1 left;  Surgeon: Latanya Maudlin, MD;  Location: [facility];  Service: Orthopedics;  Laterality: N/A;  64mn  . LYMPH NODE DISSECTION  ~ 1960   groin  . stress cardiolite  09/06/2002  . TONSILLECTOMY    . VITRECTOMY Bilateral        Family History   Problem Relation Age of Onset  . Stroke Father        strong FH cerebrovascular disease  . Diabetes Brother   . Heart disease Brother        CHF  . Cancer Mother   . Colon cancer Neg Hx     Social History   Tobacco Use  . Smoking status: Former Smoker    Packs/Durflinger: 0.00    Types: Cigarettes    Quit date: 08/20/2019    Years since quitting: 1.2  . Smokeless tobacco: Never Used  . Tobacco comment: Successfully quit smoking 08/20/2019  Vaping Use  . Vaping Use: Never used  Substance Use Topics  . Alcohol use: No  . Drug use: No    Home Medications Prior to Admission medications   Medication Sig Start Date End Date Taking? Authorizing Provider  oxyCODONE-acetaminophen (PERCOCET) 5-325 MG tablet Take 1 tablet by mouth every 8 (eight) hours as needed for severe pain. 11/15/20  Yes WRipley Fraise MD  acetaminophen (TYLENOL) 500 MG tablet Take 1,000 mg by mouth every 6 (six) hours as needed for mild pain or headache.    [provider]  albuterol (PROVENTIL) (2.5 MG/3ML) 0.083% nebulizer solution USE 1 VIAL VIA NEBULIZER EVERY 6 HOURS AS NEEDED FOR WHEEZING OR SHORTNESS OF BREATH AND INHALE INTO LUNGS 07/09/20 07/09/21  ARigoberto Noel MD  albuterol (VENTOLIN HFA)  108 (90 Base) MCG/ACT inhaler INHALE 2 PUFFS BY MOUTH INTO THE LUNGS EVERY 6 HOURS AS NEEDED FOR WHEEZING OR SHORTNESS OF BREATH. 06/20/20 06/20/21  Parrett, Fonnie Mu, NP  aspirin EC 81 MG tablet Take 1 tablet (81 mg total) by mouth daily. Swallow whole. Patient taking differently: Take 81 mg by mouth at bedtime. Swallow whole. 04/16/20   Tolia, Sunit, DO  CHANTIX STARTING MONTH PAK 0.5 MG X 11 & 1 MG X 42 tablet  06/24/20   [provider]  COVID-19 At Home Antigen Test KIT USE AS DIRECTED WITHIN PACKAGE INSTRUCTIONS. 09/18/20 09/18/21  Edmon Crape, RPH  diltiazem (CARDIZEM CD) 180 MG 24 hr capsule Take 1 capsule (180 mg total) by mouth daily. 10/01/20   Tolia, Sunit, DO  Fluticasone-Salmeterol (ADVAIR) 100-50  MCG/DOSE AEPB INHALE 1 PUFF BY MOUTH INTO LUNGS TWICE DAILY 09/26/20 09/26/21  Rigoberto Noel, MD  furosemide (LASIX) 20 MG tablet TAKE 1 TABLET (20 MG TOTAL) BY MOUTH DAILY AS NEEDED. 10/01/20 10/01/21  Tolia, Sunit, DO  Glucagon, rDNA, (GLUCAGON EMERGENCY) 1 MG KIT USE AS NEEDED FOR SEVERE HYPOGLYCEMIA 09/25/20 09/25/21  [name], MD  glucose blood test strip USE 8 TIMES DAILY AS DIRECTED TO MONITOR BLOOD GLUCOSE 01/04/20 01/03/21  [name], MD  ibuprofen (ADVIL) 200 MG tablet Take 800 mg by mouth every 8 (eight) hours as needed (pain.).    [provider]  insulin lispro (HUMALOG) 100 UNIT/ML injection Use up to 100 units daily in insulin pump 06/19/20 06/19/21  [name], MD  insulin lispro (HUMALOG) 100 UNIT/ML KwikPen Junior Inject 0.05 mLs (5 Units total) into the skin 3 (three) times daily before meals. 07/08/19   Donne Hazel, MD  insulin lispro (HUMALOG) 100 UNIT/ML KwikPen INJECT UP TO 100 UNITS A [place] 08/29/20 08/29/21  [name], MD  Insulin Pen Needle 31G X 5 MM MISC 1 Device by Does not apply route 4 (four) times daily. For use with insulin pens 07/08/19   Donne Hazel, MD  Insulin Pen Needle 31G X 5 MM MISC USE AS DIRECTED 4 TIMES DAILY WITH INSULIN PEN 09/01/20 09/01/21  [name], MD  ipratropium-albuterol (DUONEB) 0.5-2.5 (3) MG/3ML SOLN TAKE AS DIRECTED TWICE A [place] AND AS NEEDED 09/16/20 09/16/21  Rigoberto Noel, MD  levothyroxine (SYNTHROID) 100 MCG tablet Take 100 mcg by mouth every evening. 01/28/20   [provider]  levothyroxine (SYNTHROID) 100 MCG tablet TAKE 1 TABLET BY MOUTH ONCE DAILY 01/28/20 02/10/21  [name], MD  metoCLOPramide (REGLAN) 10 MG tablet Take 1 tablet (10 mg total) by mouth 4 (four) times daily. 01/18/11 01/28/11  Inda Castle, MD  metoCLOPramide (REGLAN) 5 MG tablet TAKE 1 TABLET BY MOUTH TWICE DAILY BEFORE A MEAL. 10/09/20 10/09/21  [name], MD  MINOXIDIL EX Apply 1 application topically daily. For hair loss     [provider]  mometasone-formoterol (DULERA) 100-5 MCG/ACT AERO INHALE 2 PUFFS BY MOUTH INTO LUNGS IN THE MORNING AND AT BEDTIME Patient not taking: Reported on 09/16/2020 11/12/19 11/11/20  Lauraine Rinne, NP  NON FORMULARY Take 100 mg by mouth daily. hyaluronic acid    [provider]  pantoprazole (PROTONIX) 40 MG tablet Take 1 tablet by mouth daily for 90 days. 10/28/20     PFIZER-BIONTECH COVID-19 VACC 30 MCG/0.3ML injection  04/24/20   [provider]  promethazine (PHENERGAN) 25 MG tablet Take 25 mg by mouth every 8 (eight) hours as needed for nausea or vomiting.  11/22/19   [provider]  simvastatin (ZOCOR) 40 MG tablet Take 40 mg by mouth at bedtime.     [provider]  simvastatin (ZOCOR) 40 MG tablet TAKE 1 TABLET BY MOUTH ONCE A [place] 06/02/20 06/02/21  [name], MD  simvastatin (ZOCOR) 40 MG tablet TAKE 1 TABLET BY MOUTH ONCE DAILY 02/22/20 02/21/21  [name], MD  sodium chloride HYPERTONIC 3 % nebulizer solution Take by nebulization 2 (two) times daily. 12/06/19   Rigoberto Noel, MD  tamsulosin (FLOMAX) 0.4 MG CAPS capsule Take 1 capsule by mouth daily for 90 days. 10/28/20     traZODone (DESYREL) 150 MG tablet Take 150 mg by mouth at bedtime.    [provider]  traZODone (DESYREL) 150 MG tablet TAKE 1 TABLET BY MOUTH AT BEDTIME AS NEEDED FOR SLEEP 02/26/20 02/25/21  [name], MD  TURMERIC CURCUMIN PO Take 1 capsule by mouth daily.    [provider]  varenicline (CHANTIX) 1 MG tablet TAKE 1 TABLET BY MOUTH 2 TIMES DAILY 04/04/20 04/04/21  [name], MD    Allergies    Augmentin [amoxicillin-pot clavulanate] and Lexapro [escitalopram oxalate]  Review of Systems   Review of Systems  Constitutional: Negative for fever.  Respiratory: Negative for shortness of breath.   Cardiovascular: Negative for chest pain.  Gastrointestinal: Negative for abdominal pain.  Musculoskeletal: Negative for back pain and neck  pain.  Neurological: Negative for headaches.  All other systems reviewed and are negative.   Physical Exam Updated Vital Signs BP (!) 150/90   Pulse 75   Temp 98.2 F (36.8 C) (Oral)   Resp 18   Ht 1.753 m (_0 )   Wt 83.5 kg   SpO2 97%   BMI 27.17 kg/m   Physical Exam CONSTITUTIONAL: Well developed/well nourished HEAD: Normocephalic/atraumatic EYES: EOMI/PERRL ENMT: Mucous membranes moist NECK: supple no meningeal signs SPINE/BACK:entire spine nontender CV: S1/S2 noted, no murmurs/rubs/gallops noted LUNGS: Lungs are clear to auscultation bilaterally, no apparent distress Chest-diffuse tenderness to right chest, no crepitus or bruising ABDOMEN: soft, nontender, no rebound or guarding, bowel sounds noted throughout abdomen, no right upper quadrant tenderness or bruising GU:no cva tenderness, no bruising to flank NEURO: Pt is awake/alert/appropriate, moves all extremitiesx4.  No facial droop.   EXTREMITIES: pulses normal/equal, full ROM, all other extremities/joints palpated/ranged and nontender SKIN: warm, color normal PSYCH: no abnormalities of mood noted, alert and oriented to situation  ED Results / Procedures / Treatments   Labs (all labs ordered are listed, but only abnormal results are displayed) Labs Reviewed - No data to display  EKG None  Radiology DG Ribs Unilateral W/Chest Right  Result Date: 11/14/2020 CLINICAL DATA:  [place], anterior and posterior right-sided rib pain EXAM: RIGHT RIBS AND CHEST - 3+ VIEW COMPARISON:  09/16/2020 FINDINGS: Frontal and oblique views of the right thoracic cage are obtained. The cardiac silhouette is unremarkable. No airspace disease, effusion, or pneumothorax. There are no acute displaced fractures. Prior healed left rib fractures are again noted. IMPRESSION: 1. No acute intrathoracic process.  No displaced rib fractures. Electronically Signed   By: Randa Ngo M.D.   On: 11/14/2020 23:04    Procedures Procedures    Medications Ordered in ED Medications  lidocaine (LIDODERM) 5 % 1 patch (1 patch Transdermal Patch Applied 11/14/20 2250)  HYDROcodone-acetaminophen (NORCO/VICODIN) 5-325 MG per tablet 1 tablet (1 tablet Oral Given 11/15/20 0025)  ibuprofen (ADVIL) tablet 400 mg (400 mg Oral Given 11/15/20 0025)    ED  Course  I have reviewed the triage vital signs and the nursing notes.  Pertinent imaging results that were available during my care of the patient were reviewed by me and considered in my medical decision making (see chart for details).    MDM Rules/Calculators/A&P                          X-ray reviewed and is negative. Patient is in no acute distress.  Will treat with pain medicines. We discussed return precautions Final Clinical Impression(s) / ED Diagnoses Final diagnoses:  Blunt trauma to chest, initial encounter    Rx / DC Orders ED Discharge Orders         Ordered    oxyCODONE-acetaminophen (PERCOCET) 5-325 MG tablet  Every 8 hours PRN        11/15/20 0005           Ripley Fraise, MD 11/15/20 [phone]

## 2020-11-18 ENCOUNTER — Telehealth: Payer: Self-pay | Admitting: Pulmonary Disease

## 2020-11-18 ENCOUNTER — Ambulatory Visit: Payer: 59 | Admitting: Pulmonary Disease

## 2020-11-18 ENCOUNTER — Other Ambulatory Visit: Payer: Self-pay

## 2020-11-18 ENCOUNTER — Other Ambulatory Visit (HOSPITAL_COMMUNITY): Payer: Self-pay

## 2020-11-18 ENCOUNTER — Encounter: Payer: Self-pay | Admitting: Pulmonary Disease

## 2020-11-18 DIAGNOSIS — J449 Chronic obstructive pulmonary disease, unspecified: Secondary | ICD-10-CM | POA: Diagnosis not present

## 2020-11-18 DIAGNOSIS — S20211A Contusion of right front wall of thorax, initial encounter: Secondary | ICD-10-CM

## 2020-11-18 MED ORDER — OXYCODONE-ACETAMINOPHEN 5-325 MG PO TABS
1.0000 | ORAL_TABLET | Freq: Three times a day (TID) | ORAL | 0 refills | Status: DC | PRN
  Filled 2020-11-18: qty 10, 4d supply, fill #0

## 2020-11-18 NOTE — Telephone Encounter (Signed)
Seen as OV

## 2020-11-18 NOTE — Assessment & Plan Note (Signed)
Continue Advair 

## 2020-11-18 NOTE — Patient Instructions (Signed)
Take Aleve or motrin 400 mg twice daily with food Take percocet only for breakthrough  Use incentive spirometer q 2h to keep lungs inflated

## 2020-11-18 NOTE — Assessment & Plan Note (Addendum)
No evidence of rib fractures, no external bruising.  He likely has rib contusions or rib strain. I explained that nonnarcotics are the best form of treatment -I have asked him to take NSAIDs twice daily with food, renal function normal. Will refill 10 tablets of Percocet only to use for breakthrough pain or if he cannot sleep at night Emphasized incentive spirometer use in spite of pain, especially given his prior episode of pneumonia after a fall

## 2020-11-18 NOTE — Progress Notes (Signed)
Subjective:    Patient ID: James [name], male    DOB: 11-May-1956, 65 y.o.   MRN: [id number]  HPI  64yosmoker for follow-up of chronic asthmatic bronchitis/bronchiectasis and recurrent aspiration pneumonia  Hequit smoking 03/2015 PMH -C-spine surgery 05/2016 and was noted to have moderate aspiration riskona swallow study 01/2017 HLD, Anxiety/depression, Spinal stenosis, CAD,IDDMwith retinopathy on insulin pump, CKD -cardiac evaluation>>echo showed normal LV function, cardiac cath showed one-vessel disease 75% LAD, felt to have microvascular ischemia >>diltiazem   06/2019 hospitalized for 10 days for asthma exacerbation treated with prednisone taper and Levaquin.  10/2019 admitted for asthma exacerbation, esophagram showed pharyngeal dysphagia with aspiration of barium outpatient swallow evaluation  5/9 fall with extensive left chest pain, feels likerib fractures. 5/14 hospitalized for left lower lobe pneumonia   He was unable to get [facility] due to insurance issues, has been taking Advair, Symbicort was very expensive   Chief Complaint  Patient presents with  . Follow-up    [place] on Friday, very sore. It hurts to breathe, cough   Acute office visit today [place] 4/29  anterior and posterior right-sided rib pain.  Tote onto the back of his truck, and lost his balance and fell on the grass, his elbow dug into his ribs He went to the ED, x-ray rib series did not show any fractures, he is having a lot of pain. He was given Percocet about 10 tablets, he has 2 left.  Pain is slightly better but still there.  He is trying to use incentive spirometer but hurting a lot, cough is with clear sputum. He remains on Advair, has stopped using Mucinex DM because this was making him cough more and worse pleuritic pain He is nervous due to his past experience with a fall in 2021 followed by pneumonia  Significant tests/ events reviewed  LDCT 06/2020 >>3 mm nodules  Swallow eval  >>pharyngeal dysphagia due to incomplete epiglottic inversion over the laryngeal vestibule as well as impaired timing of laryngeal vestibule closure,Presence of ADCF hardware may contribute.A mendelsohn maneuver - in which pt elevates larynx and maintains it in that position to lengthen duration of LVC - led to decreased quantity of aspiration  HRCT chest 5/2021bilateral lower lobe bronchiectasis, no ILD,New airspace consolidation in the left lower lobe,Tracheobronchomalacia. Aortic atherosclerosis  Esophagram 10/31/19 >>Frank tracheal aspiration of thick barium during the pharyngeal phase of swallowing, causing cough response,Widely patent distal esophageal mucosal ring  Spirometry 2012 >> FEV1 1.94 (52%) PFTs 05/2015 ratio 74, FEV1 72%, no BD response, mild restriction  PFTs 09/2018-ratio 75, FEV1 70%, FVC 70%, no bronchodilator response  CT sinus 08/2013 neg  Ct chest 01/2014 - left volume lossand consolidation left lower lobe-rib fractures CT chest 2017-improved aeration left lung,? Mild bronchiectasis    Review of Systems neg for any significant sore throat, dysphagia, itching, sneezing, nasal congestion or excess/ purulent secretions, fever, chills, sweats, unintended wt loss, pleuritic or exertional cp, hempoptysis, orthopnea pnd or change in chronic leg swelling. Also denies presyncope, palpitations, heartburn, abdominal pain, nausea, vomiting, diarrhea or change in bowel or urinary habits, dysuria,hematuria, rash, arthralgias, visual complaints, headache, numbness weakness or ataxia.     Objective:   Physical Exam  Gen. Pleasant, well-nourished, in no distress ENT - no thrush, no pallor/icterus,no post nasal drip Neck: No JVD, no thyromegaly, no carotid bruits Lungs: no use of accessory muscles, no dullness to percussion, clear without rales or rhonchi  Cardiovascular: Rhythm regular, heart sounds  normal, no murmurs or gallops, no peripheral  edema Musculoskeletal: No deformities, no cyanosis or clubbing        Assessment & Plan:

## 2020-11-18 NOTE — Telephone Encounter (Signed)
Called and spoke to pt. Pt states he fell recently and went to the ED. Pt states the pain in his chest and back since his fall have been rough. Pt states he has had an increase in SOB, cough and congestion. Pt states because of the fall he has a hard time coughing due to the pain. Pt requesting an appt with Dr. Elsworth Soho, scheduled OV today 11/18/20. Pt states he would not be able to go back to the ED as his copay is $500 and because he already went once he is not able to pay another $500. However, pt is aware that if he has an any sudden worsening in s/s to seek emergency care.   Will forward to Dr. Elsworth Soho as FYI since appt is today at 1115.

## 2020-11-18 NOTE — Telephone Encounter (Signed)
Pt stated that they had an accident over the weekend and he said that he did a x-ray at the hospital and he did not break any ribs but he stated he believes that he has fractured them and he said that he has been having a lot of difficulty breathing, more increased SOB, congestion and states that he is wheezing at times and hurts when he coughs; at times he is coughing up mucus and stated that it is sometimes a productive cough. Pt wanted to be seen by Dr. Elsworth Soho ASAP did schedule him for tomorrow on 11/19/20 at 3:00. Pls regard; [phone]

## 2020-11-19 ENCOUNTER — Other Ambulatory Visit (HOSPITAL_COMMUNITY): Payer: Self-pay

## 2020-11-19 ENCOUNTER — Ambulatory Visit: Payer: 59 | Admitting: Pulmonary Disease

## 2020-11-19 MED FILL — Glucose Blood Test Strip: 75 days supply | Qty: 600 | Fill #0 | Status: AC

## 2020-11-24 ENCOUNTER — Other Ambulatory Visit (HOSPITAL_COMMUNITY): Payer: Self-pay

## 2020-11-26 ENCOUNTER — Other Ambulatory Visit (HOSPITAL_COMMUNITY): Payer: Self-pay

## 2020-11-26 DIAGNOSIS — E103593 Type 1 diabetes mellitus with proliferative diabetic retinopathy without macular edema, bilateral: Secondary | ICD-10-CM | POA: Diagnosis not present

## 2020-11-26 DIAGNOSIS — I2510 Atherosclerotic heart disease of native coronary artery without angina pectoris: Secondary | ICD-10-CM | POA: Diagnosis not present

## 2020-11-26 DIAGNOSIS — T8572XA Infection and inflammatory reaction due to insulin pump, initial encounter: Secondary | ICD-10-CM | POA: Diagnosis not present

## 2020-11-26 DIAGNOSIS — E785 Hyperlipidemia, unspecified: Secondary | ICD-10-CM | POA: Diagnosis not present

## 2020-11-26 DIAGNOSIS — Z4681 Encounter for fitting and adjustment of insulin pump: Secondary | ICD-10-CM | POA: Diagnosis not present

## 2020-11-26 DIAGNOSIS — Z794 Long term (current) use of insulin: Secondary | ICD-10-CM | POA: Diagnosis not present

## 2020-11-26 MED ORDER — DOXYCYCLINE HYCLATE 100 MG PO TABS
100.0000 mg | ORAL_TABLET | Freq: Two times a day (BID) | ORAL | 0 refills | Status: DC
  Filled 2020-11-26: qty 20, 10d supply, fill #0

## 2020-11-27 ENCOUNTER — Other Ambulatory Visit (HOSPITAL_COMMUNITY): Payer: Self-pay

## 2020-11-28 ENCOUNTER — Other Ambulatory Visit (HOSPITAL_COMMUNITY): Payer: Self-pay

## 2020-11-28 MED ORDER — HYDROCODONE BIT-HOMATROP MBR 5-1.5 MG/5ML PO SOLN
ORAL | 0 refills | Status: DC
  Filled 2020-11-28: qty 100, 10d supply, fill #0

## 2020-12-03 ENCOUNTER — Other Ambulatory Visit (HOSPITAL_COMMUNITY): Payer: Self-pay | Admitting: Endocrinology

## 2020-12-03 ENCOUNTER — Other Ambulatory Visit (HOSPITAL_COMMUNITY): Payer: Self-pay

## 2020-12-03 MED FILL — Varenicline Tartrate Tab 1 MG (Base Equiv): ORAL | 30 days supply | Qty: 60 | Fill #1 | Status: AC

## 2020-12-04 ENCOUNTER — Other Ambulatory Visit (HOSPITAL_COMMUNITY): Payer: Self-pay

## 2020-12-04 MED ORDER — TRAZODONE HCL 150 MG PO TABS
150.0000 mg | ORAL_TABLET | Freq: Every evening | ORAL | 1 refills | Status: DC | PRN
  Filled 2020-12-04: qty 90, 90d supply, fill #0
  Filled 2021-03-31: qty 90, 90d supply, fill #1

## 2020-12-05 ENCOUNTER — Telehealth: Payer: Self-pay | Admitting: Pulmonary Disease

## 2020-12-05 ENCOUNTER — Other Ambulatory Visit (HOSPITAL_COMMUNITY): Payer: Self-pay

## 2020-12-05 MED ORDER — PREDNISONE 10 MG PO TABS
10.0000 mg | ORAL_TABLET | Freq: Every day | ORAL | 0 refills | Status: DC
  Filled 2020-12-05: qty 20, 6d supply, fill #0

## 2020-12-05 MED ORDER — AZITHROMYCIN 250 MG PO TABS
250.0000 mg | ORAL_TABLET | Freq: Every day | ORAL | 0 refills | Status: DC
  Filled 2020-12-05: qty 6, 5d supply, fill #0

## 2020-12-05 NOTE — Telephone Encounter (Signed)
Pt stated that he believes he caught a cold from his granddaughter stated the symptoms he is experiencing are; coughing, chest pain, SOB, some wheezing, coughing phlegm (no color stated only one Paci he noticed green color to it), fatigue, headache. Pt states that he is taking nebulizer treatment and only uses it when he is not feeling well. Pt is wanting to know if an x-ray is needed. Pt states symptoms have been going on for about a week. Pharmacy; [facility]. Pls regard; [phone]

## 2020-12-05 NOTE — Telephone Encounter (Signed)
Primary Pulmonologist: Dr Elsworth Soho Last office visit and with whom: 11/18/20 with Dr Elsworth Soho What do we see them for (pulmonary problems): chronic asthmatic bronchitis, contusion of right chest wall  Last OV assessment/plan:  Assessment & Plan Note by Rigoberto Noel, MD at 11/18/2020 11:55 AM  Author: Rigoberto Noel, MD Author Type: Physician Filed: 11/18/2020 11:56 AM  Note Status: [place] List: Cosign Not Required Encounter Date: 11/18/2020  Problem: CONTUSION, RIGHT CHEST WALL  Editor: Rigoberto Noel, MD (Physician)      Prior Versions: 1. Rigoberto Noel, MD (Physician) at 11/18/2020 11:56 AM - Written  No evidence of rib fractures, no external bruising.  He likely has rib contusions or rib strain. I explained that nonnarcotics are the best form of treatment -I have asked him to take NSAIDs twice daily with food, renal function normal. Will refill 10 tablets of Percocet only to use for breakthrough pain or if he cannot sleep at night Emphasized incentive spirometer use in spite of pain, especially given his prior episode of pneumonia after a fall       Assessment & Plan Note by Rigoberto Noel, MD at 11/18/2020 11:54 AM  Author: Rigoberto Noel, MD Author Type: Physician Filed: 11/18/2020 11:54 AM  Note Status: Written Cosign: Cosign Not Required Encounter Date: 11/18/2020  Problem: Chronic asthmatic bronchitis ([place])  Editor: Rigoberto Noel, MD (Physician)             Continue Advair       Patient Instructions by Rigoberto Noel, MD at 11/18/2020 11:15 AM  Author: Rigoberto Noel, MD Author Type: Physician Filed: 11/18/2020 11:49 AM  Note Status: Signed Cosign: Cosign Not Required Encounter Date: 11/18/2020  Editor: Rigoberto Noel, MD (Physician)             Take Aleve or motrin 400 mg twice daily with food Take percocet only for breakthrough  Use incentive spirometer q 2h to keep lungs inflated        Instructions  Take Aleve or motrin 400 mg twice daily with food Take percocet only for  breakthrough  Use incentive spirometer q 2h to keep lungs inflated        After Visit Summary (Printed 11/18/2020)   Reason for call: Called and spoke with patient who states that he believes he caught a cold from his granddaughter stated the symptoms he is experiencing are; productive cough with white and a little brown phlegm, upper right side chest pain, SOB, fatigue, headache. Pt states that he is taking nebulizer treatment and zyrtec currently to which he has only used nebs when he is not feeling well. Pt states symptoms have been going on since last Friday (11/28/20). Patient denies any fever. O2 sat is currently 94-96% on room air as patient had pulse ox on during call. Patient is currently on doxycycline 100mg  BID which will be completed tomorrow for another matter (infected site where he had insulin pump at on lower left abdomen). Patient states he has NOT been tested for Covid.  Pt is wanting to know if an x-ray is needed.    Pharmacy; [facility]  [facility] Please advise as Dr Elsworth Soho is not available.    Allergies  Allergen Reactions  . Augmentin [Amoxicillin-Pot Clavulanate] Nausea And Vomiting and Other (See Comments)    Did it involve swelling of the face/tongue/throat, SOB, or low BP? No Did it involve sudden or severe rash/hives, skin peeling, or any reaction on the inside  of your mouth or nose? No Did you need to seek medical attention at a hospital or doctor's office? No When did it last happen?5+ years If all above answers are "NO", may proceed with cephalosporin use.   [name] [Escitalopram Oxalate] Itching    Immunization History  Administered Date(s) Administered  . Influenza Split 04/19/2011, 05/22/2012, 04/17/2013, 04/18/2017, 03/28/2019  . Influenza Whole 07/31/2007, 04/15/2009, 05/08/2010  . Influenza, Quadrivalent, Recombinant, Inj, Pf 03/22/2018, 04/28/2020  . Influenza,inj,Quad PF,6+ Mos 03/19/2015, 05/19/2016  .  Influenza-Unspecified 03/19/2015, 04/12/2017  . PFIZER(Purple Top)SARS-COV-2 Vaccination 09/21/2019, 10/12/2019, 04/13/2020, 10/24/2020  . Pneumococcal Polysaccharide-23 04/19/2003, 09/24/2011, 04/11/2019  . Td 12/22/2000  . Tdap 09/24/2011, 08/19/2013  . Zoster 09/27/2019, 12/20/2019  . Zoster Recombinat (Shingrix) 09/27/2019, 12/20/2019

## 2020-12-05 NOTE — Telephone Encounter (Signed)
Please send in [place] and prednisone 40mg  x 2 days; 20mg  x 2 days; 10mg  x 2 days. If not better next week should have covid test and office visit with CXR

## 2020-12-05 NOTE — Telephone Encounter (Signed)
I called and spoke with patient wife, who is on the [place]. She verbalized understanding and I sent into pred taper and Zpak into preferred pharmacy and informed wife if symptoms do not improve to get a covid test and call the office to be seen with a CXR. Wife verbalized understanding, nothing further needed.

## 2020-12-09 ENCOUNTER — Telehealth: Payer: Self-pay | Admitting: Pulmonary Disease

## 2020-12-09 NOTE — Telephone Encounter (Signed)
I have called the pt and he is scheduled for appt with BW in the morning at 10.  Pt is aware.  Nothing further is needed.

## 2020-12-09 NOTE — Telephone Encounter (Signed)
Primary Pulmonologist: Dr. Elsworth Soho Last office visit and with whom: Dr. Elsworth Soho 11/18/20 What do we see them for (pulmonary problems): chronic asthmatic bronchitis Last OV assessment/plan:  Instructions  Take Aleve or motrin 400 mg twice daily with food Take percocet only for breakthrough  Use incentive spirometer q 2h to keep lungs inflated          Reason for call:  Patient stated he is feeling worse.  Patient stated he is having increased wheezing, sob, and cough.  Patient stated his cough is mostly nonproductive, but he does have some clear sputum at times.  Patient denies any fever or chills.  Patient stated he did get a covid test and it was negative.  Patient stated he was prescribed a zpack and prednisone taper 12/05/20.  Patient stated he had more [place] of z pack and it would be completed.  Patient stated he feels like he needs something more or stronger to help him feel better. Patient request any prescriptions to be sent to [facility].  Message routed to [place]  Allergen Reactions  . Augmentin [Amoxicillin-Pot Clavulanate] Nausea And Vomiting and Other (See Comments)    Did it involve swelling of the face/tongue/throat, SOB, or low BP? No Did it involve sudden or severe rash/hives, skin peeling, or any reaction on the inside of your mouth or nose? No Did you need to seek medical attention at a hospital or doctor's office? No When did it last happen?5+ years If all above answers are "NO", may proceed with cephalosporin use.   [name] [Escitalopram Oxalate] Itching    Immunization History  Administered Date(s) Administered  . Influenza Split 04/19/2011, 05/22/2012, 04/17/2013, 04/18/2017, 03/28/2019  . Influenza Whole 07/31/2007, 04/15/2009, 05/08/2010  . Influenza, Quadrivalent, Recombinant, Inj, Pf 03/22/2018, 04/28/2020  . Influenza,inj,Quad PF,6+ Mos 03/19/2015, 05/19/2016  . Influenza-Unspecified 03/19/2015, 04/12/2017  . PFIZER(Purple  Top)SARS-COV-2 Vaccination 09/21/2019, 10/12/2019, 04/13/2020, 10/24/2020  . Pneumococcal Polysaccharide-23 04/19/2003, 09/24/2011, 04/11/2019  . Td 12/22/2000  . Tdap 09/24/2011, 08/19/2013  . Zoster 09/27/2019, 12/20/2019  . Zoster Recombinat (Shingrix) 09/27/2019, 12/20/2019

## 2020-12-09 NOTE — Telephone Encounter (Signed)
Please arrange for office visit with APP

## 2020-12-10 ENCOUNTER — Ambulatory Visit: Payer: 59 | Admitting: Primary Care

## 2020-12-10 ENCOUNTER — Ambulatory Visit (INDEPENDENT_AMBULATORY_CARE_PROVIDER_SITE_OTHER): Payer: 59

## 2020-12-10 ENCOUNTER — Other Ambulatory Visit: Payer: Self-pay

## 2020-12-10 ENCOUNTER — Other Ambulatory Visit (HOSPITAL_COMMUNITY): Payer: Self-pay

## 2020-12-10 ENCOUNTER — Encounter: Payer: Self-pay | Admitting: Primary Care

## 2020-12-10 VITALS — BP 120/68 | HR 72 | Temp 97.4°F | Ht 69.0 in | Wt 186.0 lb

## 2020-12-10 DIAGNOSIS — J4541 Moderate persistent asthma with (acute) exacerbation: Secondary | ICD-10-CM

## 2020-12-10 DIAGNOSIS — J449 Chronic obstructive pulmonary disease, unspecified: Secondary | ICD-10-CM | POA: Diagnosis not present

## 2020-12-10 DIAGNOSIS — I5189 Other ill-defined heart diseases: Secondary | ICD-10-CM | POA: Insufficient documentation

## 2020-12-10 DIAGNOSIS — N401 Enlarged prostate with lower urinary tract symptoms: Secondary | ICD-10-CM | POA: Insufficient documentation

## 2020-12-10 DIAGNOSIS — E063 Autoimmune thyroiditis: Secondary | ICD-10-CM | POA: Insufficient documentation

## 2020-12-10 DIAGNOSIS — J45909 Unspecified asthma, uncomplicated: Secondary | ICD-10-CM | POA: Diagnosis not present

## 2020-12-10 DIAGNOSIS — I6523 Occlusion and stenosis of bilateral carotid arteries: Secondary | ICD-10-CM | POA: Insufficient documentation

## 2020-12-10 MED ORDER — PREDNISONE 10 MG PO TABS
ORAL_TABLET | ORAL | 0 refills | Status: DC
  Filled 2020-12-10: qty 20, 8d supply, fill #0

## 2020-12-10 MED ORDER — CEFDINIR 300 MG PO CAPS
300.0000 mg | ORAL_CAPSULE | Freq: Two times a day (BID) | ORAL | 0 refills | Status: DC
  Filled 2020-12-10: qty 14, 7d supply, fill #0

## 2020-12-10 NOTE — Progress Notes (Signed)
Please let patient know CXR showed chronic lung changes with small new density left lung base, could me atelectasis vs pneumonia. I will send in abx. Also he has mildly displaced fractures involving right 4-5th rib. I would follow up with his PCP regarding this. Main this is pain management

## 2020-12-10 NOTE — Patient Instructions (Addendum)
Recommendations: Continue Advair 1 puff twice daily  Use Albuterol nebulizer 2-3 times a [place] followed by flutter valve 2-3 times a [place]  Take Mucinex 600mg  twice a [place] 7-10 days (Take with glass of water)  Orders: CXR today   Rx: Prednisone taper as directed   Follow-up: As needed if not better

## 2020-12-10 NOTE — Assessment & Plan Note (Addendum)
-   Acute exacerbation of chronic asthmatic bronchitis. Symptoms started 1-2 weeks ago after he sustained a fall outside after lifting something heavy. He was given course of azithromycin which only partially cleared his symptoms. He has scattered rhonchi though lung fields today. Suspect possible aspiration pneumonia, ordering CXR. Recommend patient use albuterol nebulizer 2-3 times a [place] scheduled followed by flutter valve until symptoms improve. Advised he also take Mucinex 600mg  twice a [place] 7-10 days. Sending in Cefdinir 300mg  BID x 7 days and prednisone taper. Encourage deep breathing exercises. He will notify us if symptoms do not improve or worsen.

## 2020-12-10 NOTE — Progress Notes (Signed)
_0  ID: James [name], male    DOB: 28-Mar-1956, 65 y.o.   MRN: [id number]  Chief Complaint  Patient presents with  . Shortness of Breath    Cough with some production, wheezing    Referring provider: [name], MD  HPI: 65 year old male, former smoker. PMH significant for chronic asthmatic bronchitis, allergic rhinitis, CAD, GERD, DM type 1 (on insulin pump), HLD. Patient of Dr. Elsworth Soho. Maintained on Dulera 100, prn albuterol.    Previous [facility] pulmonary encounter:  12/10/2020- Dr. Elsworth Soho  64yosmoker for follow-up of chronic asthmatic bronchitis/bronchiectasis and recurrent aspiration pneumonia  Hequit smoking 03/2015 PMH -C-spine surgery 05/2016 and was noted to have moderate aspiration riskona swallow study 01/2017 HLD, Anxiety/depression, Spinal stenosis, CAD,IDDMwith retinopathy on insulin pump, CKD -cardiac evaluation>>echo showed normal LV function, cardiac cath showed one-vessel disease 75% LAD, felt to have microvascular ischemia >>diltiazem  06/2019 hospitalized for 10 days for asthma exacerbation treated with prednisone taper and Levaquin.  10/2019 admitted for asthma exacerbation, esophagram showed pharyngeal dysphagia with aspiration of barium outpatient swallow evaluation  5/9 fall with extensive left chest pain, feels likerib fractures. 5/14 hospitalized for left lower lobe pneumonia   He was unable to get [facility] due to insurance issues, has been taking Advair, Symbicort was very expensive  12/10/2020 Patient presents today for sick visit. He reports symptoms of fatigue, HA, congestion, cough, chest tightness and shortness of breath for the last 1-2 weeks. Cough is productive with mostly clear mucus. He is compliant with Advair. He fell on his right side 4 weeks ago when trying to lift something heavy. Rib xray on 11/14/20 showed no acute intrathoracic process or displaced rib fractures. Respiratory symptoms started shortly after his fall. He is eating  ok. Concern aspiration. He is diabetic, A1C 6.3. Denies fever. He has had a negative covid test.   Imaging:  LDCT 06/24/20 >> mild centrilobular emphysema on imaging. Few scattered solid pulmonary nodules, largest measuring 3.27m left upper lobe. Calcified left lower lobe granulomas.    Allergies  Allergen Reactions  . Augmentin [Amoxicillin-Pot Clavulanate] Nausea And Vomiting and Other (See Comments)    Did it involve swelling of the face/tongue/throat, SOB, or low BP? No Did it involve sudden or severe rash/hives, skin peeling, or any reaction on the inside of your mouth or nose? No Did you need to seek medical attention at a hospital or doctor's office? No When did it last happen?5+ years If all above answers are "NO", may proceed with cephalosporin use.   .[name][Escitalopram Oxalate] Itching    Immunization History  Administered Date(s) Administered  . Influenza Split 04/19/2011, 05/22/2012, 04/17/2013, 04/18/2017, 03/28/2019  . Influenza Whole 07/31/2007, 04/15/2009, 05/08/2010  . Influenza, Quadrivalent, Recombinant, Inj, Pf 03/22/2018, 04/28/2020  . Influenza,inj,Quad PF,6+ Mos 03/19/2015, 05/19/2016  . Influenza-Unspecified 03/19/2015, 04/12/2017  . PFIZER(Purple Top)SARS-COV-2 Vaccination 09/21/2019, 10/12/2019, 04/13/2020, 10/24/2020  . Pneumococcal Polysaccharide-23 04/19/2003, 09/24/2011, 04/11/2019  . Td 12/22/2000  . Tdap 09/24/2011, 08/19/2013  . Zoster 09/27/2019, 12/20/2019  . Zoster Recombinat (Shingrix) 09/27/2019, 12/20/2019    Past Medical History:  Diagnosis Date  . ANXIETY 04/03/2007  . Anxiety   . ASTHMA 09/06/2008  . Asthma   . ASTHMATIC BRONCHITIS, ACUTE 10/25/2008  . Bladder neck obstruction   . CARPAL TUNNEL SYNDROME, BILATERAL 07/31/2007   issues resolved, no surgery  . Cervical disc disease   . Chronic bronchitis ([place]    "get it about q yr" (02/12/2014)  . COPD (ch[place] obstructive pulmonary disease) ([place]   .  CORONARY ARTERY DISEASE  04/03/2007  . DEPRESSION 04/03/2007  . Depression   . DIABETES MELLITUS, TYPE I 04/03/2007  . Diabetic retinopathy associated with diabetes mellitus due to underlying condition ([place]) 04/03/2007  . DM W/EYE MANIFESTATIONS, TYPE I, UNCONTROLLED 04/04/2007  . 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 ED (erectile dysfunction)   . History of kidney stones   . HYPERLIPIDEMIA 04/04/2007  . Pneumonia    "several times and again today" (02/13/2104)  . Renal insufficiency   . Seizures ([place])    "insulin seizure from time to time; none in the last couple years" (02/12/2014)  . Spinal stenosis     Tobacco History: Social History   Tobacco Use  Smoking Status Former Smoker  . Packs/Strine: 0.00  . Types: Cigarettes  . Quit date: 08/20/2019  . Years since quitting: 1.3  Smokeless Tobacco Never Used  Tobacco Comment   Successfully quit smoking 08/20/2019   Counseling given: Not Answered Comment: Successfully quit smoking 08/20/2019   Outpatient Medications Prior to Visit  Medication Sig Dispense Refill  . acetaminophen (TYLENOL) 500 MG tablet Take 1,000 mg by mouth every 6 (six) hours as needed for mild pain or headache.    . albuterol (PROVENTIL) (2.5 MG/3ML) 0.083% nebulizer solution USE 1 VIAL VIA NEBULIZER EVERY 6 HOURS AS NEEDED FOR WHEEZING OR SHORTNESS OF BREATH AND INHALE INTO LUNGS 180 mL 3  . albuterol (VENTOLIN HFA) 108 (90 Base) MCG/ACT inhaler INHALE 2 PUFFS BY MOUTH INTO THE LUNGS EVERY 6 HOURS AS NEEDED FOR WHEEZING OR SHORTNESS OF BREATH. 18 g 2  . aspirin EC 81 MG tablet Take 1 tablet (81 mg total) by mouth daily. Swallow whole. (Patient taking differently: Take 81 mg by mouth at bedtime. Swallow whole.) 30 tablet 11  . azithromycin (ZITHROMAX) 250 MG tablet Take 2 tablets by mouth on [place] 1 then take 1 tablet daily on days 2-5. 6 tablet 0  . CHANTIX STARTING MONTH PAK 0.5 MG X 11 & 1 MG X 42 tablet     . diltiazem (CARDIZEM CD) 180 MG 24 hr capsule Take 1 capsule (180 mg total)  by mouth daily. 90 capsule 0  . Fluticasone-Salmeterol (ADVAIR) 100-50 MCG/DOSE AEPB INHALE 1 PUFF BY MOUTH INTO LUNGS TWICE DAILY 60 each 11  . Glucagon, rDNA, (GLUCAGON EMERGENCY) 1 MG KIT USE AS NEEDED FOR SEVERE HYPOGLYCEMIA 2 kit 12  . glucose blood test strip USE 8 TIMES DAILY AS DIRECTED TO MONITOR BLOOD GLUCOSE 600 strip 3  . ibuprofen (ADVIL) 200 MG tablet Take 800 mg by mouth every 8 (eight) hours as needed (pain.).    [name] insulin lispro (HUMALOG) 100 UNIT/ML injection Use up to 100 units daily in insulin pump 90 mL 2  . insulin lispro (HUMALOG) 100 UNIT/ML KwikPen Junior Inject 0.05 mLs (5 Units total) into the skin 3 (three) times daily before meals. 3 mL 0  . Insulin Pen Needle 31G X 5 MM MISC 1 Device by Does not apply route 4 (four) times daily. For use with insulin pens 100 each 0  . Insulin Pen Needle 31G X 5 MM MISC USE AS DIRECTED 4 TIMES DAILY WITH INSULIN PEN 400 each 3  . levothyroxine (SYNTHROID) 100 MCG tablet TAKE 1 TABLET BY MOUTH ONCE DAILY 90 tablet 3  . metoCLOPramide (REGLAN) 5 MG tablet TAKE 1 TABLET BY MOUTH TWICE DAILY BEFORE A MEAL. 180 tablet 3  . MINOXIDIL EX Apply 1 application topically daily. For hair loss    .  NON FORMULARY Take 100 mg by mouth daily. hyaluronic acid    . pantoprazole (PROTONIX) 40 MG tablet Take 1 tablet by mouth daily for 90 days. 90 tablet 3  . promethazine (PHENERGAN) 25 MG tablet Take 25 mg by mouth every 8 (eight) hours as needed for nausea or vomiting.     . simvastatin (ZOCOR) 40 MG tablet TAKE 1 TABLET BY MOUTH ONCE A [place] 90 tablet 3  . sodium chloride HYPERTONIC 3 % nebulizer solution Take by nebulization 2 (two) times daily. 240 mL 3  . tamsulosin (FLOMAX) 0.4 MG CAPS capsule Take 1 capsule by mouth daily for 90 days. 90 capsule 3  . traZODone (DESYREL) 150 MG tablet TAKE 1 TABLET BY MOUTH AT BEDTIME AS NEEDED FOR SLEEP 90 tablet 1  . varenicline (CHANTIX) 1 MG tablet TAKE 1 TABLET BY MOUTH 2 TIMES DAILY (Patient taking differently:  Take by mouth daily.) 60 tablet 3  . COVID-19 At Home Antigen Test KIT USE AS DIRECTED WITHIN PACKAGE INSTRUCTIONS. 4 kit 0  . doxycycline (VIBRA-TABS) 100 MG tablet Take 1 tablet (100 mg total) by mouth 2 (two) times daily for 10 days 20 tablet 0  . furosemide (LASIX) 20 MG tablet TAKE 1 TABLET (20 MG TOTAL) BY MOUTH DAILY AS NEEDED. 90 tablet 0  . HYDROcodone bit-homatropine (HYCODAN) 5-1.5 MG/5ML syrup Take 84ms by mouth twice daily for 10 days 100 mL 0  . insulin lispro (HUMALOG) 100 UNIT/ML KwikPen INJECT UP TO 100 UNITS A [place] 15 mL 3  . ipratropium-albuterol (DUONEB) 0.5-2.5 (3) MG/3ML SOLN TAKE AS DIRECTED TWICE A [place] AND AS NEEDED 360 mL 3  . levothyroxine (SYNTHROID) 100 MCG tablet Take 100 mcg by mouth every evening.    . metoCLOPramide (REGLAN) 10 MG tablet Take 1 tablet (10 mg total) by mouth 4 (four) times daily. 120 tablet 3  . oxyCODONE-acetaminophen (PERCOCET) 5-325 MG tablet Take 1 tablet by mouth every 8 (eight) hours as needed for severe pain. 10 tablet 0  . PFIZER-BIONTECH COVID-19 VACC 30 MCG/0.3ML injection     . predniSONE (DELTASONE) 10 MG tablet Take 4 tablets by mouth daily for 2 days, then take 2 tabs for 2 days, then take 1 tab for 2 days. 20 tablet 0  . simvastatin (ZOCOR) 40 MG tablet Take 40 mg by mouth at bedtime.   5  . simvastatin (ZOCOR) 40 MG tablet TAKE 1 TABLET BY MOUTH ONCE DAILY 30 tablet 3  . traZODone (DESYREL) 150 MG tablet Take 150 mg by mouth at bedtime.  5  . TURMERIC CURCUMIN PO Take 1 capsule by mouth daily.     No facility-administered medications prior to visit.    Review of Systems  Review of Systems  Constitutional: Positive for fatigue.  HENT: Positive for congestion.   Respiratory: Positive for cough and shortness of breath.     Physical Exam  BP 120/68   Pulse 72   Temp (!) 97.4 F (36.3 C)   Ht _0  (1.753 m)   Wt 186 lb (84.4 kg)   SpO2 96%   BMI 27.47 kg/m  Physical Exam Constitutional:      Appearance: Normal  appearance.  HENT:     Head: Normocephalic and atraumatic.     Mouth/Throat:     Comments: Deferred d/t masking Cardiovascular:     Rate and Rhythm: Normal rate and regular rhythm.  Pulmonary:     Effort: Pulmonary effort is normal.     Breath sounds: Rhonchi present.  Skin:    General: Skin is warm and dry.  Neurological:     General: No focal deficit present.     Mental Status: He is alert and oriented to person, place, and time. Mental status is at baseline.  Psychiatric:        Mood and Affect: Mood normal.        Behavior: Behavior normal.        Thought Content: Thought content normal.        Judgment: Judgment normal.      Lab Results:  CBC    Component Value Date/Time   WBC 4.0 04/22/2020 0847   WBC 4.0 12/02/2019 0912   RBC 5.09 04/22/2020 0847   RBC 4.03 (L) 12/02/2019 0912   HGB 14.3 04/29/2020 1350   HGB 15.8 04/22/2020 0847   HCT 42.0 04/29/2020 1350   HCT 46.1 04/22/2020 0847   PLT 266 04/22/2020 0847   MCV 91 04/22/2020 0847   MCH 31.0 04/22/2020 0847   MCH 31.8 12/02/2019 0912   MCHC 34.3 04/22/2020 0847   MCHC 35.7 12/02/2019 0912   RDW 12.7 04/22/2020 0847   LYMPHSABS 0.8 12/01/2019 0518   MONOABS 0.6 12/01/2019 0518   EOSABS 0.0 12/01/2019 0518   BASOSABS 0.0 12/01/2019 0518    BMET    Component Value Date/Time   NA 140 04/29/2020 1350   K 4.3 04/29/2020 1350   CL 104 04/29/2020 1350   CO2 23 04/29/2020 1332   GLUCOSE 115 (H) 04/29/2020 1350   BUN 16 04/29/2020 1350   CREATININE 0.80 04/29/2020 1350   CALCIUM 8.8 (L) 04/29/2020 1332   GFRNONAA >60 04/29/2020 1332   GFRAA >60 12/02/2019 0912    BNP    Component Value Date/Time   BNP 48.7 06/30/2019 0619    ProBNP No results found for: PROBNP  Imaging: DG Chest 2 View  Result Date: 12/10/2020 CLINICAL DATA:  65 year old with moderate persistent asthmatic bronchitis with acute exacerbation. EXAM: CHEST - 2 VIEW COMPARISON:  11/14/2020 and 09/16/2020 FINDINGS: Chronic coarse  lung markings. Slightly increased densities at the left lung base are new. Heart size is within normal limits and stable. Again noted is a surgical plate in the lower cervical spine. Trachea is midline. No large pleural effusions. Mildly displaced fractures involving the right fourth and fifth ribs. Suspect these are subacute fractures based on the prior imaging. These fractures were not displaced or clearly visible on the prior rib images. Old right eighth rib fracture. IMPRESSION: 1. Chronic lung changes with a small focus of new densities at the left lung base. Left basilar densities could represent atelectasis. 2. Mildly displaced fractures involving the right fourth and fifth ribs. Suspect these are acute or subacute fractures. Electronically Signed   By: Markus Daft M.D.   On: 12/10/2020 11:24   DG Ribs Unilateral W/Chest Right  Result Date: 11/14/2020 CLINICAL DATA:  [place], anterior and posterior right-sided rib pain EXAM: RIGHT RIBS AND CHEST - 3+ VIEW COMPARISON:  09/16/2020 FINDINGS: Frontal and oblique views of the right thoracic cage are obtained. The cardiac silhouette is unremarkable. No airspace disease, effusion, or pneumothorax. There are no acute displaced fractures. Prior healed left rib fractures are again noted. IMPRESSION: 1. No acute intrathoracic process.  No displaced rib fractures. Electronically Signed   By: Randa Ngo M.D.   On: 11/14/2020 23:04     Assessment & Plan:   Chronic asthmatic bronchitis (HCC) - Acute exacerbation of chronic asthmatic bronchitis. Symptoms started 1-2 weeks  ago after he sustained a fall outside after lifting something heavy. He was given course of azithromycin which only partially cleared his symptoms. He has scattered rhonchi though lung fields today. Suspect possible aspiration pneumonia, ordering CXR. Recommend patient use albuterol nebulizer 2-3 times a [place] scheduled followed by flutter valve until symptoms improve. Advised he also take Mucinex  614m twice a [place] 7-10 days. Sending in Cefdinir 3077mBID x 7 days and prednisone taper. Encourage deep breathing exercises. He will notify usKoreaf symptoms do not improve or worsen.    ElMartyn EhrichNP 12/10/2020

## 2020-12-11 ENCOUNTER — Encounter: Payer: Self-pay | Admitting: *Deleted

## 2020-12-26 ENCOUNTER — Other Ambulatory Visit (HOSPITAL_COMMUNITY): Payer: Self-pay

## 2020-12-26 MED FILL — Varenicline Tartrate Tab 1 MG (Base Equiv): ORAL | 30 days supply | Qty: 60 | Fill #2 | Status: AC

## 2020-12-26 MED FILL — Simvastatin Tab 40 MG: ORAL | 90 days supply | Qty: 90 | Fill #0 | Status: AC

## 2020-12-27 ENCOUNTER — Other Ambulatory Visit (HOSPITAL_COMMUNITY): Payer: Self-pay

## 2020-12-29 ENCOUNTER — Other Ambulatory Visit (HOSPITAL_COMMUNITY): Payer: Self-pay

## 2020-12-29 MED ORDER — FLUTICASONE-SALMETEROL 100-50 MCG/ACT IN AEPB
INHALATION_SPRAY | RESPIRATORY_TRACT | 9 refills | Status: DC
  Filled 2020-12-29: qty 60, 30d supply, fill #0
  Filled 2021-02-14: qty 60, 30d supply, fill #1

## 2020-12-30 ENCOUNTER — Other Ambulatory Visit (HOSPITAL_COMMUNITY): Payer: Self-pay

## 2021-01-12 DIAGNOSIS — E1065 Type 1 diabetes mellitus with hyperglycemia: Secondary | ICD-10-CM | POA: Diagnosis not present

## 2021-01-12 DIAGNOSIS — E1029 Type 1 diabetes mellitus with other diabetic kidney complication: Secondary | ICD-10-CM | POA: Diagnosis not present

## 2021-01-15 ENCOUNTER — Other Ambulatory Visit (HOSPITAL_COMMUNITY): Payer: Self-pay

## 2021-01-15 DIAGNOSIS — E1029 Type 1 diabetes mellitus with other diabetic kidney complication: Secondary | ICD-10-CM | POA: Diagnosis not present

## 2021-01-15 DIAGNOSIS — E1065 Type 1 diabetes mellitus with hyperglycemia: Secondary | ICD-10-CM | POA: Diagnosis not present

## 2021-01-15 MED ORDER — DILTIAZEM HCL ER COATED BEADS 180 MG PO CP24
ORAL_CAPSULE | ORAL | 0 refills | Status: DC
  Filled 2021-01-15: qty 90, 90d supply, fill #0

## 2021-01-20 ENCOUNTER — Other Ambulatory Visit (HOSPITAL_COMMUNITY): Payer: Self-pay

## 2021-01-22 ENCOUNTER — Other Ambulatory Visit (HOSPITAL_COMMUNITY): Payer: Self-pay

## 2021-01-26 ENCOUNTER — Other Ambulatory Visit (HOSPITAL_COMMUNITY): Payer: Self-pay

## 2021-01-31 ENCOUNTER — Other Ambulatory Visit (HOSPITAL_COMMUNITY): Payer: Self-pay

## 2021-01-31 MED FILL — Metoclopramide HCl Tab 5 MG (Base Equivalent): ORAL | 90 days supply | Qty: 180 | Fill #0 | Status: AC

## 2021-02-02 ENCOUNTER — Other Ambulatory Visit (HOSPITAL_COMMUNITY): Payer: Self-pay

## 2021-02-03 ENCOUNTER — Other Ambulatory Visit (HOSPITAL_COMMUNITY): Payer: Self-pay

## 2021-02-03 MED ORDER — LEVOTHYROXINE SODIUM 100 MCG PO TABS
100.0000 ug | ORAL_TABLET | Freq: Every day | ORAL | 3 refills | Status: DC
  Filled 2021-02-03: qty 90, 90d supply, fill #0
  Filled 2021-05-19: qty 90, 90d supply, fill #1
  Filled 2021-08-24: qty 90, 90d supply, fill #2
  Filled 2021-11-29: qty 90, 90d supply, fill #3

## 2021-02-09 ENCOUNTER — Other Ambulatory Visit: Payer: Self-pay | Admitting: Pulmonary Disease

## 2021-02-09 ENCOUNTER — Other Ambulatory Visit (HOSPITAL_COMMUNITY): Payer: Self-pay

## 2021-02-09 MED ORDER — INSULIN LISPRO (1 UNIT DIAL) 100 UNIT/ML (KWIKPEN)
PEN_INJECTOR | SUBCUTANEOUS | 5 refills | Status: DC
  Filled 2021-02-09: qty 30, 30d supply, fill #0
  Filled 2021-06-03: qty 45, 45d supply, fill #1
  Filled 2021-10-12: qty 45, 45d supply, fill #2

## 2021-02-09 MED ORDER — INSULIN GLARGINE-YFGN 100 UNIT/ML ~~LOC~~ SOPN
PEN_INJECTOR | SUBCUTANEOUS | 5 refills | Status: DC
  Filled 2021-02-09: qty 24, 68d supply, fill #0
  Filled 2021-02-11: qty 9, 25d supply, fill #0

## 2021-02-09 MED ORDER — VARENICLINE TARTRATE 1 MG PO TABS
ORAL_TABLET | Freq: Two times a day (BID) | ORAL | 3 refills | Status: DC
  Filled 2021-02-09: qty 60, 30d supply, fill #0

## 2021-02-09 NOTE — Telephone Encounter (Signed)
OK to resume Reviewed FDA recall notes

## 2021-02-10 ENCOUNTER — Other Ambulatory Visit (HOSPITAL_COMMUNITY): Payer: Self-pay

## 2021-02-11 ENCOUNTER — Other Ambulatory Visit (HOSPITAL_COMMUNITY): Payer: Self-pay

## 2021-02-14 ENCOUNTER — Other Ambulatory Visit (HOSPITAL_COMMUNITY): Payer: Self-pay

## 2021-02-14 MED ORDER — CARESTART COVID-19 HOME TEST VI KIT
PACK | 0 refills | Status: DC
  Filled 2021-02-14: qty 2, 4d supply, fill #0

## 2021-02-16 ENCOUNTER — Other Ambulatory Visit (HOSPITAL_COMMUNITY): Payer: Self-pay

## 2021-02-16 MED ORDER — MOLNUPIRAVIR 200 MG PO CAPS
800.0000 mg | ORAL_CAPSULE | Freq: Two times a day (BID) | ORAL | 0 refills | Status: DC
  Filled 2021-02-16: qty 40, 5d supply, fill #0

## 2021-03-02 ENCOUNTER — Other Ambulatory Visit (HOSPITAL_COMMUNITY): Payer: Self-pay

## 2021-03-02 MED ORDER — INSULIN GLARGINE-YFGN 100 UNIT/ML ~~LOC~~ SOPN
PEN_INJECTOR | SUBCUTANEOUS | 3 refills | Status: DC
  Filled 2021-03-02: qty 33, 87d supply, fill #0
  Filled 2021-05-21: qty 33, 87d supply, fill #1
  Filled 2021-08-03: qty 33, 87d supply, fill #2
  Filled 2021-10-29: qty 33, 87d supply, fill #3
  Filled 2022-01-21: qty 33, 87d supply, fill #4
  Filled 2022-01-23: qty 15, 39d supply, fill #4
  Filled 2022-01-23: qty 18, 48d supply, fill #4

## 2021-03-02 MED ORDER — INSULIN GLARGINE-YFGN 100 UNIT/ML ~~LOC~~ SOPN
PEN_INJECTOR | SUBCUTANEOUS | 3 refills | Status: DC
  Filled 2021-03-02: qty 15, 39d supply, fill #0

## 2021-03-03 ENCOUNTER — Other Ambulatory Visit (HOSPITAL_COMMUNITY): Payer: Self-pay

## 2021-03-05 ENCOUNTER — Other Ambulatory Visit (HOSPITAL_COMMUNITY): Payer: Self-pay

## 2021-03-10 DIAGNOSIS — E785 Hyperlipidemia, unspecified: Secondary | ICD-10-CM | POA: Diagnosis not present

## 2021-03-10 DIAGNOSIS — J441 Chronic obstructive pulmonary disease with (acute) exacerbation: Secondary | ICD-10-CM | POA: Diagnosis not present

## 2021-03-10 DIAGNOSIS — F419 Anxiety disorder, unspecified: Secondary | ICD-10-CM | POA: Diagnosis not present

## 2021-03-10 DIAGNOSIS — I2510 Atherosclerotic heart disease of native coronary artery without angina pectoris: Secondary | ICD-10-CM | POA: Diagnosis not present

## 2021-03-10 DIAGNOSIS — N200 Calculus of kidney: Secondary | ICD-10-CM | POA: Diagnosis not present

## 2021-03-10 DIAGNOSIS — I6523 Occlusion and stenosis of bilateral carotid arteries: Secondary | ICD-10-CM | POA: Diagnosis not present

## 2021-03-10 DIAGNOSIS — E103593 Type 1 diabetes mellitus with proliferative diabetic retinopathy without macular edema, bilateral: Secondary | ICD-10-CM | POA: Diagnosis not present

## 2021-03-10 DIAGNOSIS — Z1389 Encounter for screening for other disorder: Secondary | ICD-10-CM | POA: Diagnosis not present

## 2021-03-10 DIAGNOSIS — I700 Atherosclerosis of aorta: Secondary | ICD-10-CM | POA: Diagnosis not present

## 2021-03-10 DIAGNOSIS — Z794 Long term (current) use of insulin: Secondary | ICD-10-CM | POA: Diagnosis not present

## 2021-03-13 DIAGNOSIS — I2510 Atherosclerotic heart disease of native coronary artery without angina pectoris: Secondary | ICD-10-CM | POA: Diagnosis not present

## 2021-03-13 DIAGNOSIS — Z794 Long term (current) use of insulin: Secondary | ICD-10-CM | POA: Diagnosis not present

## 2021-03-13 DIAGNOSIS — E103593 Type 1 diabetes mellitus with proliferative diabetic retinopathy without macular edema, bilateral: Secondary | ICD-10-CM | POA: Diagnosis not present

## 2021-03-13 DIAGNOSIS — Z4681 Encounter for fitting and adjustment of insulin pump: Secondary | ICD-10-CM | POA: Diagnosis not present

## 2021-03-17 ENCOUNTER — Other Ambulatory Visit (HOSPITAL_COMMUNITY): Payer: Self-pay

## 2021-03-17 MED ORDER — CONTOUR NEXT TEST VI STRP
ORAL_STRIP | 3 refills | Status: DC
  Filled 2021-03-17: qty 250, 30d supply, fill #0
  Filled 2021-05-14: qty 250, 30d supply, fill #1
  Filled 2021-08-02: qty 250, 30d supply, fill #2
  Filled 2021-09-05: qty 250, 30d supply, fill #3
  Filled 2021-10-30: qty 250, 30d supply, fill #4
  Filled 2021-12-14: qty 250, 30d supply, fill #5
  Filled 2022-02-19: qty 250, 30d supply, fill #6

## 2021-03-21 ENCOUNTER — Other Ambulatory Visit (HOSPITAL_COMMUNITY): Payer: Self-pay

## 2021-03-30 ENCOUNTER — Other Ambulatory Visit (HOSPITAL_COMMUNITY): Payer: Self-pay

## 2021-03-30 MED FILL — Insulin Pen Needle 31 G X 6 MM (1/4" or 15/64"): 25 days supply | Qty: 100 | Fill #0 | Status: AC

## 2021-03-31 ENCOUNTER — Other Ambulatory Visit (HOSPITAL_COMMUNITY): Payer: Self-pay

## 2021-04-07 DIAGNOSIS — E1029 Type 1 diabetes mellitus with other diabetic kidney complication: Secondary | ICD-10-CM | POA: Diagnosis not present

## 2021-04-07 DIAGNOSIS — E1065 Type 1 diabetes mellitus with hyperglycemia: Secondary | ICD-10-CM | POA: Diagnosis not present

## 2021-04-08 ENCOUNTER — Other Ambulatory Visit (HOSPITAL_COMMUNITY): Payer: Self-pay

## 2021-04-08 MED FILL — Simvastatin Tab 40 MG: ORAL | 90 days supply | Qty: 90 | Fill #1 | Status: AC

## 2021-04-09 ENCOUNTER — Ambulatory Visit: Payer: 59 | Attending: Internal Medicine

## 2021-04-09 ENCOUNTER — Other Ambulatory Visit (HOSPITAL_BASED_OUTPATIENT_CLINIC_OR_DEPARTMENT_OTHER): Payer: Self-pay

## 2021-04-09 DIAGNOSIS — Z23 Encounter for immunization: Secondary | ICD-10-CM

## 2021-04-09 MED ORDER — PFIZER COVID-19 VAC BIVALENT 30 MCG/0.3ML IM SUSP
INTRAMUSCULAR | 0 refills | Status: DC
Start: 2021-04-09 — End: 2021-04-16
  Filled 2021-04-09: qty 0.3, 1d supply, fill #0

## 2021-04-09 MED ORDER — INFLUENZA VAC A&B SA ADJ QUAD 0.5 ML IM PRSY
PREFILLED_SYRINGE | INTRAMUSCULAR | 0 refills | Status: DC
  Filled 2021-04-09: qty 0.5, 1d supply, fill #0

## 2021-04-09 NOTE — Progress Notes (Signed)
   Covid-19 Vaccination Clinic  Name:  James [name]    MRN: [id number] DOB: 12-Feb-1956  04/09/2021  [name] was observed post Covid-19 immunization for 15 minutes without incident. He was provided with Vaccine Information Sheet and instruction to access the V-Safe system.   [name] was instructed to call 911 with any severe reactions post vaccine: Difficulty breathing  Swelling of face and throat  A fast heartbeat  A bad rash all over body  Dizziness and weakness

## 2021-04-13 ENCOUNTER — Other Ambulatory Visit (HOSPITAL_BASED_OUTPATIENT_CLINIC_OR_DEPARTMENT_OTHER): Payer: Self-pay

## 2021-04-13 DIAGNOSIS — E1029 Type 1 diabetes mellitus with other diabetic kidney complication: Secondary | ICD-10-CM | POA: Diagnosis not present

## 2021-04-13 DIAGNOSIS — E1065 Type 1 diabetes mellitus with hyperglycemia: Secondary | ICD-10-CM | POA: Diagnosis not present

## 2021-04-16 ENCOUNTER — Other Ambulatory Visit (HOSPITAL_COMMUNITY): Payer: Self-pay

## 2021-04-16 ENCOUNTER — Other Ambulatory Visit: Payer: Self-pay | Admitting: Cardiology

## 2021-04-16 MED FILL — Diltiazem HCl Coated Beads Cap ER 24HR 180 MG: ORAL | 90 days supply | Qty: 90 | Fill #0 | Status: AC

## 2021-04-24 ENCOUNTER — Other Ambulatory Visit (HOSPITAL_COMMUNITY): Payer: Self-pay

## 2021-04-24 MED FILL — Insulin Pen Needle 31 G X 6 MM (1/4" or 15/64"): 25 days supply | Qty: 100 | Fill #1 | Status: AC

## 2021-05-11 ENCOUNTER — Other Ambulatory Visit (HOSPITAL_COMMUNITY): Payer: Self-pay

## 2021-05-12 DIAGNOSIS — Z794 Long term (current) use of insulin: Secondary | ICD-10-CM | POA: Diagnosis not present

## 2021-05-12 DIAGNOSIS — E103593 Type 1 diabetes mellitus with proliferative diabetic retinopathy without macular edema, bilateral: Secondary | ICD-10-CM | POA: Diagnosis not present

## 2021-05-12 DIAGNOSIS — F17200 Nicotine dependence, unspecified, uncomplicated: Secondary | ICD-10-CM | POA: Diagnosis not present

## 2021-05-12 DIAGNOSIS — I2510 Atherosclerotic heart disease of native coronary artery without angina pectoris: Secondary | ICD-10-CM | POA: Diagnosis not present

## 2021-05-12 DIAGNOSIS — Z4681 Encounter for fitting and adjustment of insulin pump: Secondary | ICD-10-CM | POA: Diagnosis not present

## 2021-05-14 ENCOUNTER — Other Ambulatory Visit (HOSPITAL_COMMUNITY): Payer: Self-pay

## 2021-05-19 ENCOUNTER — Other Ambulatory Visit (HOSPITAL_COMMUNITY): Payer: Self-pay

## 2021-05-19 MED FILL — Metoclopramide HCl Tab 5 MG (Base Equivalent): ORAL | 90 days supply | Qty: 180 | Fill #1 | Status: AC

## 2021-05-20 ENCOUNTER — Telehealth: Payer: Self-pay | Admitting: Pulmonary Disease

## 2021-05-20 ENCOUNTER — Other Ambulatory Visit (HOSPITAL_COMMUNITY): Payer: Self-pay

## 2021-05-20 MED ORDER — PREDNISONE 10 MG PO TABS
ORAL_TABLET | ORAL | 0 refills | Status: AC
  Filled 2021-05-20: qty 40, 16d supply, fill #0

## 2021-05-20 MED FILL — Insulin Pen Needle 31 G X 6 MM (1/4" or 15/64"): 25 days supply | Qty: 100 | Fill #2 | Status: AC

## 2021-05-20 NOTE — Telephone Encounter (Signed)
Spoke with the pt  He c/o sore throat, cough with brown sputum, SOB, wheezing, body aches for the past 3 days  He states he has had all covid vaccines including most recent one available as well as his flu shot  He is taking his advair, saline nebs and albuterol inhaler/neb I advised him to go ahead and take a home covid test to rule that out  He states he will get his spouse to help him with this  He wants recs now while waiting  Please advise thanks!  Allergies  Allergen Reactions   Augmentin [Amoxicillin-Pot Clavulanate] Nausea And Vomiting and Other (See Comments)    Did it involve swelling of the face/tongue/throat, SOB, or low BP? No Did it involve sudden or severe rash/hives, skin peeling, or any reaction on the inside of your mouth or nose? No Did you need to seek medical attention at a hospital or doctor's office? No When did it last happen?      5+ years If all above answers are "NO", may proceed with cephalosporin use.    Lexapro [Escitalopram Oxalate] Itching

## 2021-05-20 NOTE — Telephone Encounter (Signed)
Spoke to patient. He voiced understanding of Dr. Angus Palms recs. States he will call back if his mucus starts turning green or worsens. For now will take prednisone taper. [facility] outpatient pharmacy. Prescription has been sent in.

## 2021-05-21 ENCOUNTER — Other Ambulatory Visit (HOSPITAL_COMMUNITY): Payer: Self-pay

## 2021-06-01 ENCOUNTER — Other Ambulatory Visit (HOSPITAL_COMMUNITY): Payer: Self-pay

## 2021-06-01 ENCOUNTER — Other Ambulatory Visit: Payer: Self-pay | Admitting: Student

## 2021-06-01 ENCOUNTER — Telehealth: Payer: Self-pay | Admitting: Pulmonary Disease

## 2021-06-01 MED ORDER — FLUTICASONE-SALMETEROL 100-50 MCG/ACT IN AEPB
INHALATION_SPRAY | RESPIRATORY_TRACT | 10 refills | Status: DC
  Filled 2021-06-01: qty 60, 30d supply, fill #0
  Filled 2021-08-14: qty 60, 30d supply, fill #1

## 2021-06-01 NOTE — Telephone Encounter (Signed)
I called the patient and he reports that he is still having a dry cough at times and is requesting a cough syrup sent to [facility] . He reports that he is still taking some prednisone. 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   He was offered an appointment and declined. He reports that an visit would be a waste of resources. Please advise.

## 2021-06-01 NOTE — Telephone Encounter (Signed)
Can't treat this over the phone esp if already on prednisone - will need ov with all meds in hand

## 2021-06-01 NOTE — Telephone Encounter (Signed)
I called the patient and let him know that he would need an OV to get more cough syrup. He does not have his copay for anyone to tell him that he is sick. He was offered to go to PCP or an e-visit on Mychart. He repots he will try one of them. Nothing further needed.

## 2021-06-01 NOTE — Progress Notes (Signed)
Opened in error

## 2021-06-03 ENCOUNTER — Other Ambulatory Visit (HOSPITAL_COMMUNITY): Payer: Self-pay

## 2021-06-03 MED FILL — Insulin Pen Needle 31 G X 6 MM (1/4" or 15/64"): 90 days supply | Qty: 400 | Fill #3 | Status: CN

## 2021-06-03 MED FILL — Insulin Pen Needle 31 G X 6 MM (1/4" or 15/64"): 25 days supply | Qty: 100 | Fill #3 | Status: CN

## 2021-06-09 ENCOUNTER — Other Ambulatory Visit (HOSPITAL_COMMUNITY): Payer: Self-pay

## 2021-06-12 ENCOUNTER — Telehealth: Payer: Self-pay | Admitting: Cardiology

## 2021-06-17 ENCOUNTER — Other Ambulatory Visit (HOSPITAL_COMMUNITY): Payer: Self-pay

## 2021-06-18 ENCOUNTER — Other Ambulatory Visit (HOSPITAL_COMMUNITY): Payer: Self-pay

## 2021-06-18 MED FILL — Insulin Pen Needle 31 G X 6 MM (1/4" or 15/64"): 90 days supply | Qty: 400 | Fill #3 | Status: CN

## 2021-06-18 MED FILL — Insulin Pen Needle 31 G X 6 MM (1/4" or 15/64"): 25 days supply | Qty: 100 | Fill #3 | Status: AC

## 2021-06-18 MED FILL — Insulin Pen Needle 31 G X 6 MM (1/4" or 15/64"): 65 days supply | Qty: 300 | Fill #3 | Status: AC

## 2021-06-18 NOTE — Telephone Encounter (Signed)
Message left for patient for making OV appointment

## 2021-06-19 ENCOUNTER — Other Ambulatory Visit (HOSPITAL_COMMUNITY): Payer: Self-pay

## 2021-06-29 IMAGING — CR DG CHEST 2V
2 series · 2 of 2 positions shown · non-contrast
Comparison: CT from earlier in the same day.

CLINICAL DATA: Shortness of breath

EXAM:
CHEST - 2 VIEW

[w chest lat]
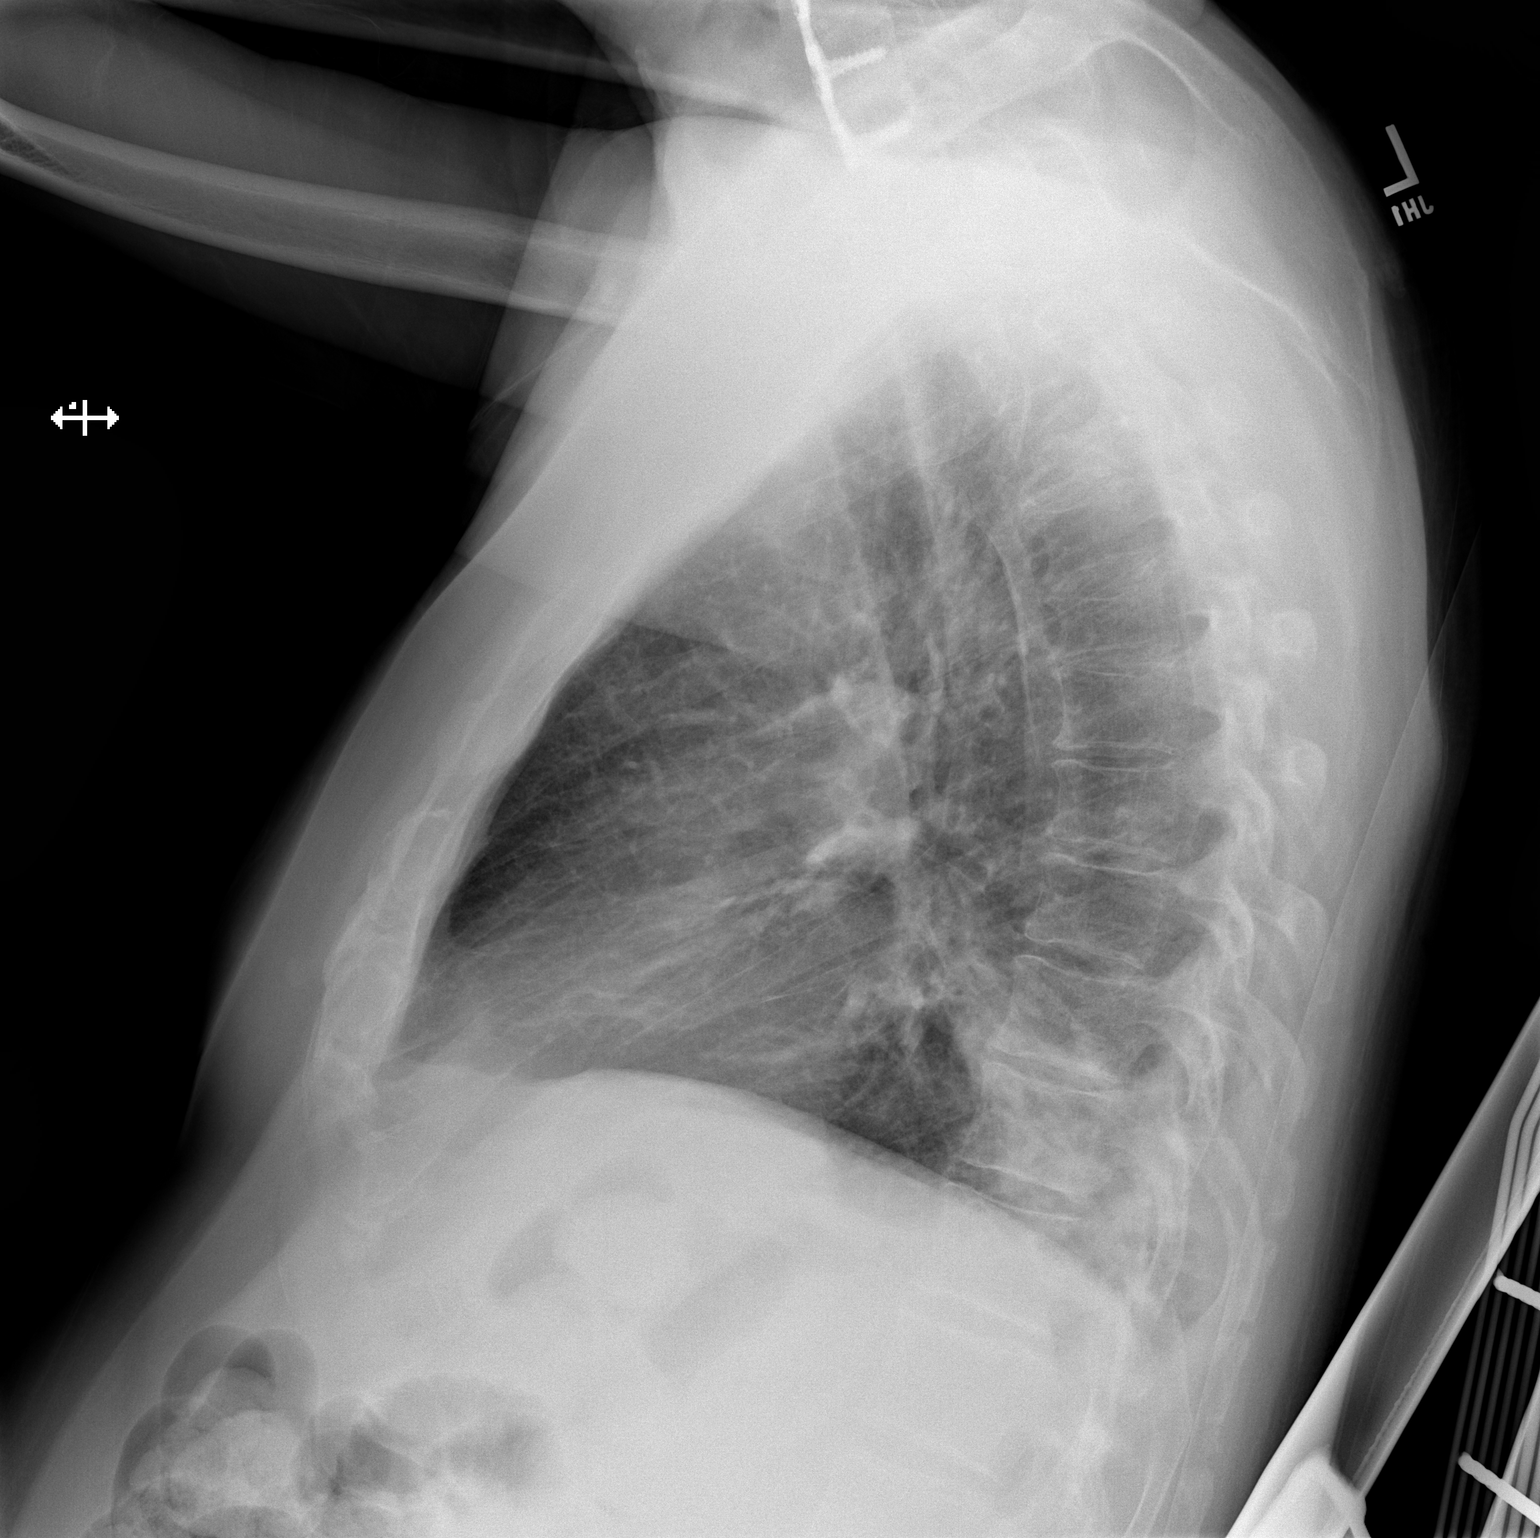

[x chest ap]
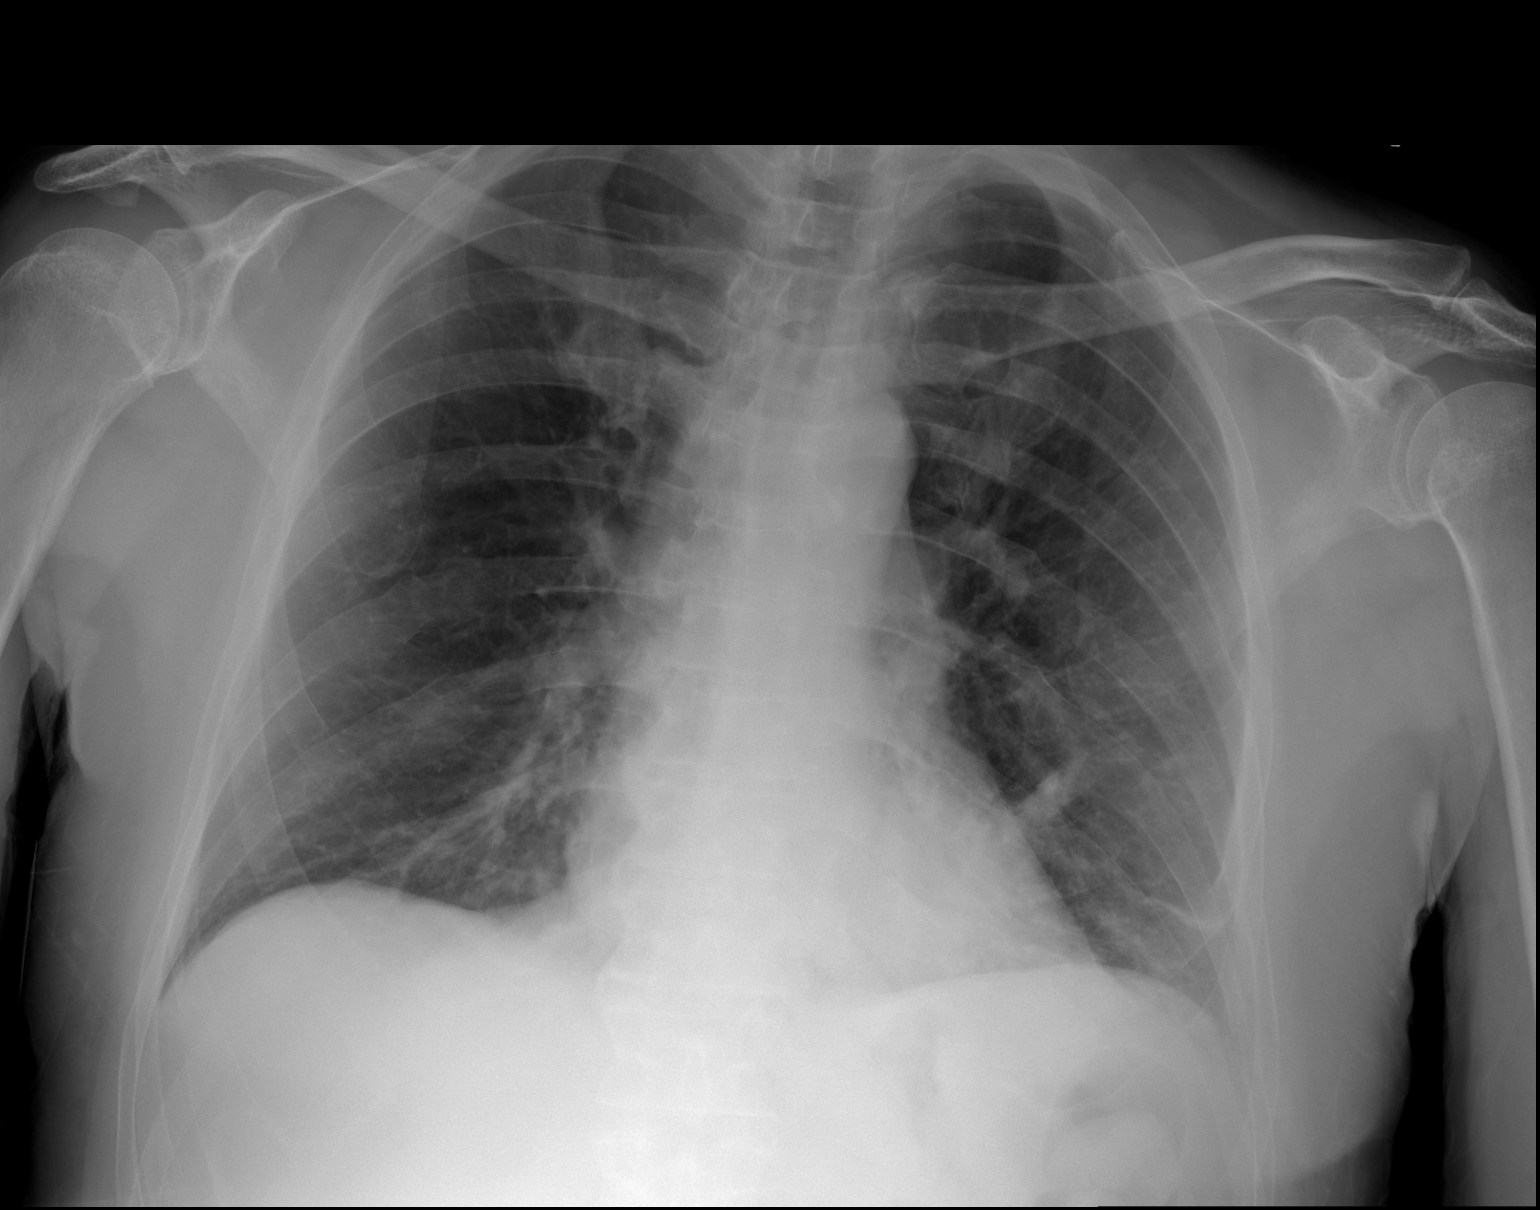

[2 of 2 positions shown; findings below may reference images not displayed]

FINDINGS: Cardiac shadow is within normal limits. Left lower lobe pneumonia is
noted. No sizable effusion is seen. No other focal infiltrate is
noted. No bony abnormality is seen. Postsurgical changes are noted
in the cervical spine.
IMPRESSION: Left lower lobe pneumonia.

## 2021-06-29 IMAGING — CT CT CHEST HIGH RESOLUTION W/O CM
2 of 7 series · 14 of 36 positions shown, 17 images · non-contrast
Comparison: Chest CT 10/29/2019.

CLINICAL DATA: 63-year-old male with history of persistent cough.

EXAM:
CT CHEST WITHOUT CONTRAST
TECHNIQUE: Multidetector CT imaging of the chest was performed following the
standard protocol without intravenous contrast. High resolution
imaging of the lungs, as well as inspiratory and expiratory imaging,
was performed.

[Series 2: thorax · axial · 0.73mm/px · z∈[+1334,+1648]mm · 11 of 177 slices shown, 14 images]
[im 10/177  mediastinal]
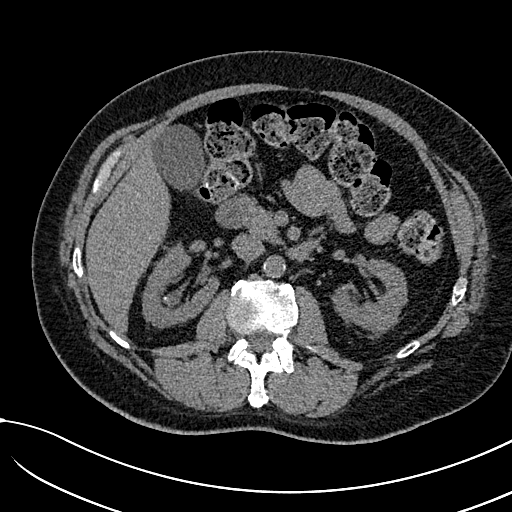
[im 10/177  lung]
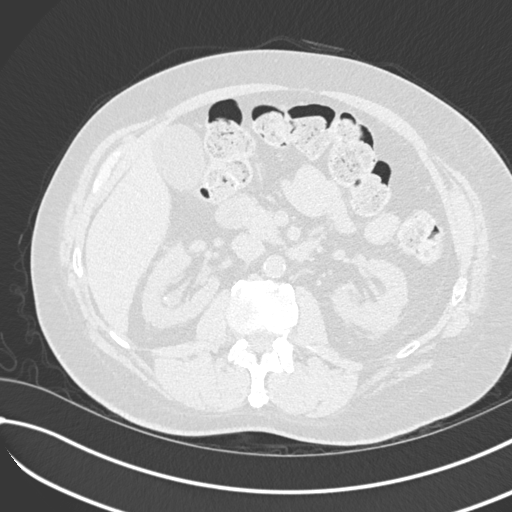
[im 28/177  lung]
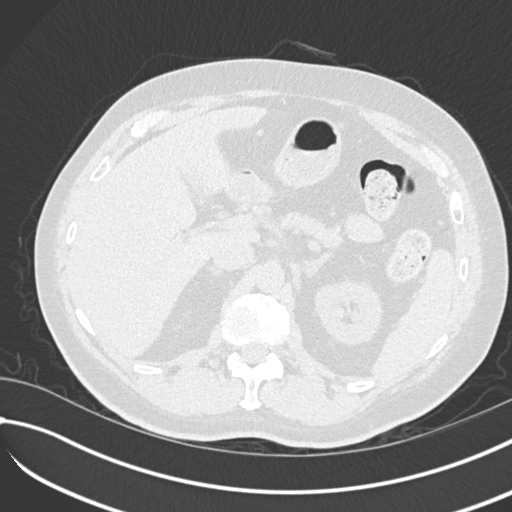
[im 47/177  lung]
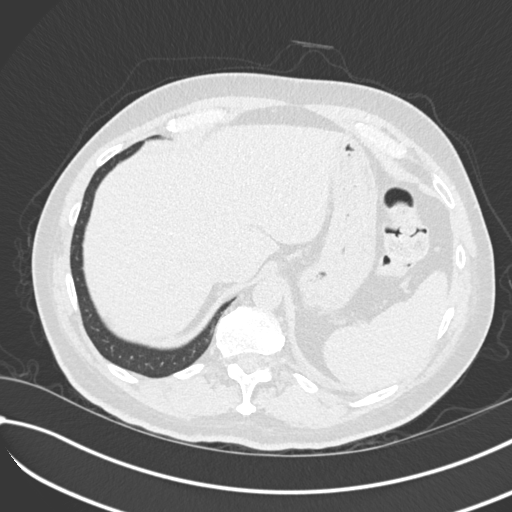
[im 56/177  lung]
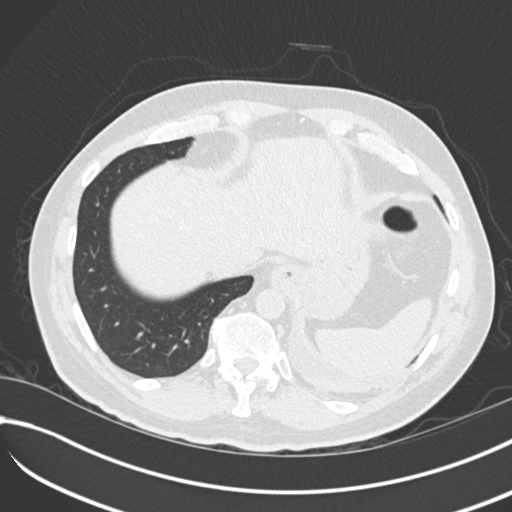
[im 75/177  mediastinal]
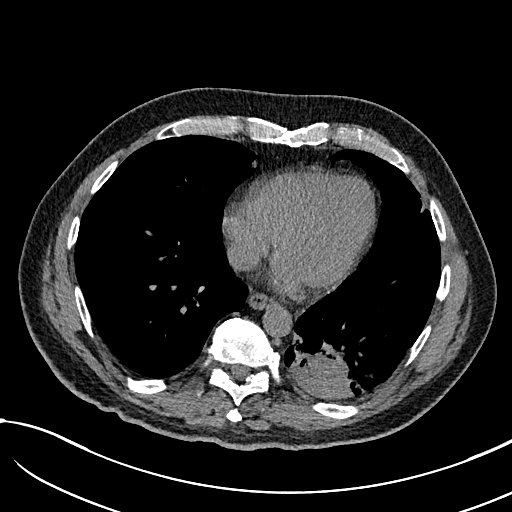
[im 75/177  lung]
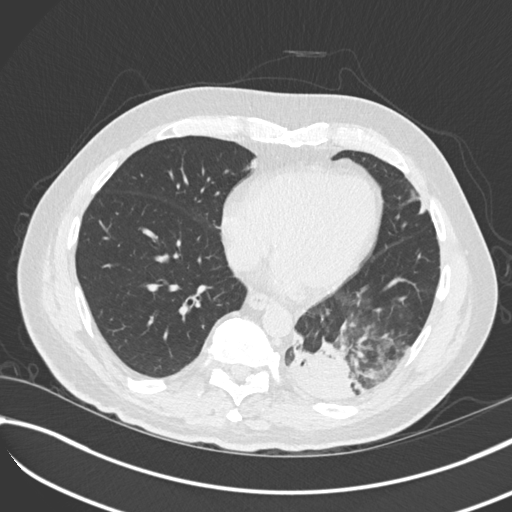
[im 93/177  lung]
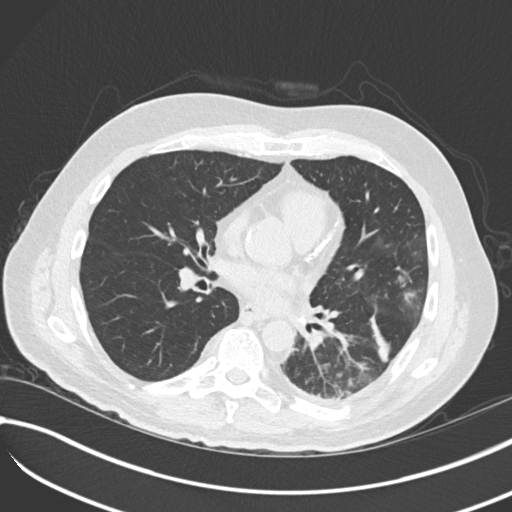
[im 102/177  lung]
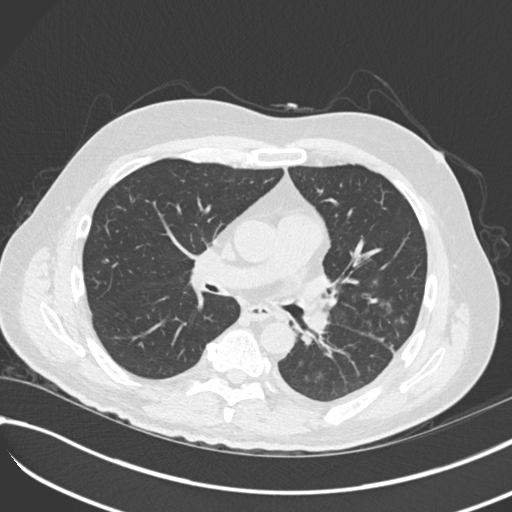
[im 121/177  lung]
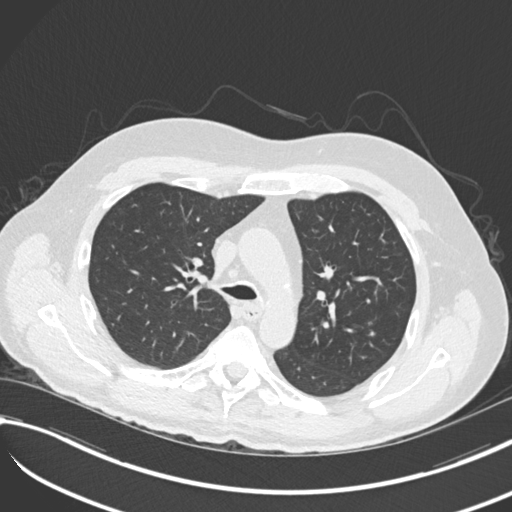
[im 130/177  mediastinal]
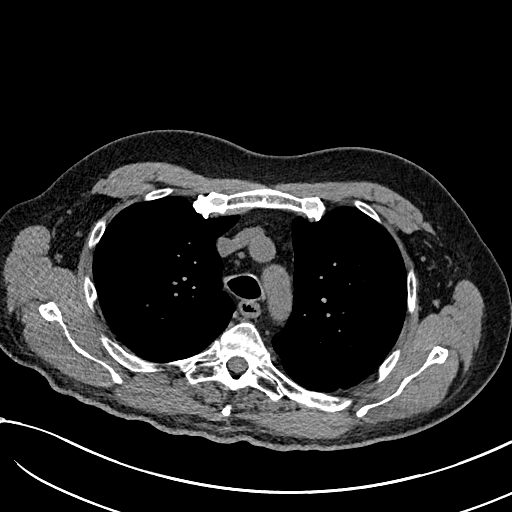
[im 130/177  lung]
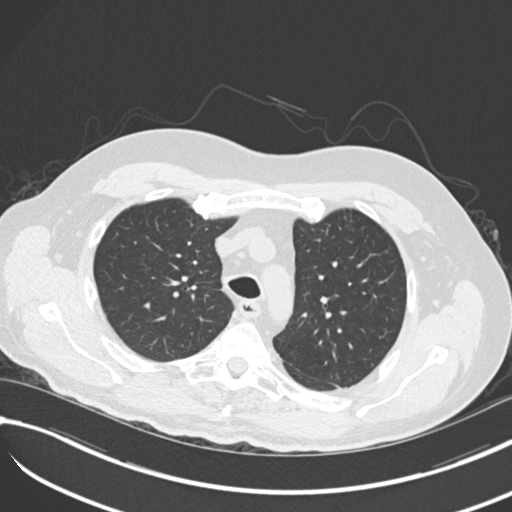
[im 149/177  lung]
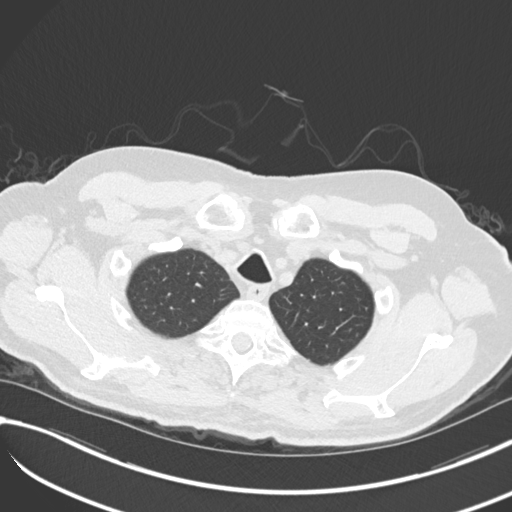
[im 167/177  lung]
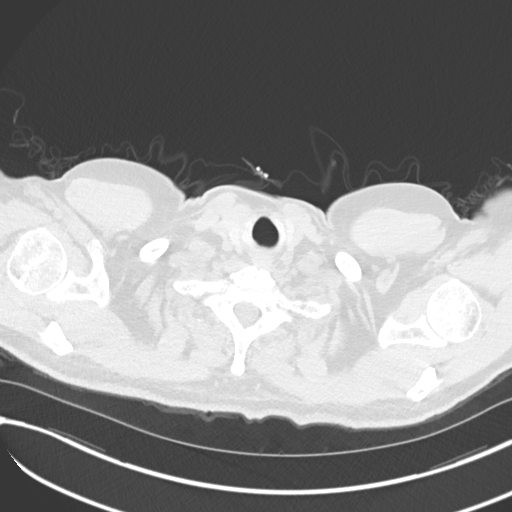

[Series 9: coronal · coronal · 0.71mm/px · 3 of 101 slices shown]
[im 21/101  lung]
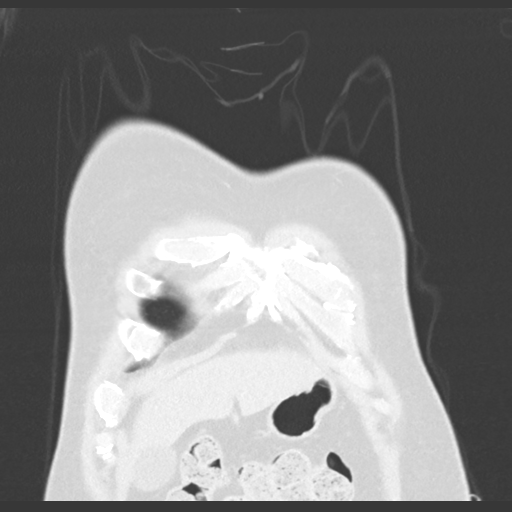
[im 41/101  lung]
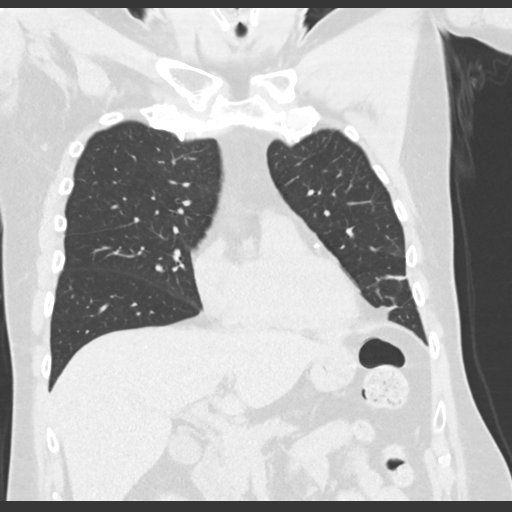
[im 61/101  lung]
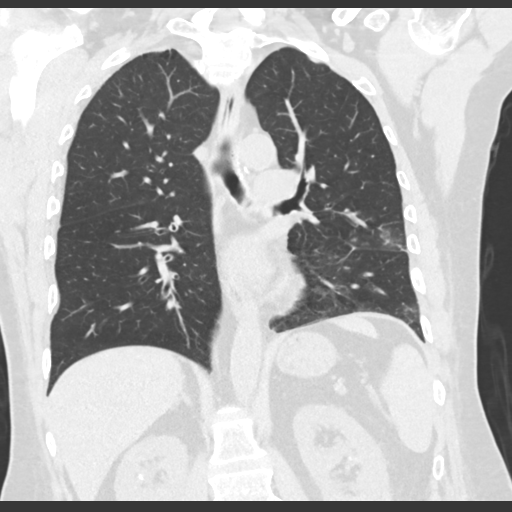

[14 of 36 positions shown; findings below may reference images not displayed]

FINDINGS: Cardiovascular: Heart size is normal. There is no significant
pericardial fluid, thickening or pericardial calcification. There is
aortic atherosclerosis, as well as atherosclerosis of the great
vessels of the mediastinum and the coronary arteries, including
calcified atherosclerotic plaque in the left main, left anterior
descending, left circumflex and right coronary arteries.

Mediastinum/Nodes: No pathologically enlarged mediastinal or hilar
lymph nodes. Please note that accurate exclusion of hilar adenopathy
is limited on noncontrast CT scans. Esophagus is unremarkable in
appearance. No axillary lymphadenopathy.

Lungs/Pleura: New airspace consolidation in the left lower lobe.
Additional areas of thickening of the peribronchovascular
interstitium with peribronchovascular ground-glass attenuation micro
nodularity noted in the lower lobes and to a lesser extent in the
dependent portions of the left upper lobe, most compatible with
endobronchial spread of infection. Small amount of scarring in the
inferior segment of the lingula and in the medial segment of the
right middle lobe, similar to prior studies. No pleural effusions. 7
x 3 mm (mean diameter 5 mm) ill-defined nodule in the right upper
lobe (axial image 154 of series 4), new compared to the prior study
and likely of infectious or inflammatory etiology. No other larger
more suspicious appearing pulmonary nodules or masses are noted.
High-resolution images demonstrate no other areas of septal
thickening, subpleural reticulation, traction bronchiectasis or
honeycombing to suggest interstitial lung disease. Inspiratory and
expiratory imaging demonstrates partial collapse of the trachea and
mainstem bronchi during expiration indicative of
tracheobronchomalacia.

Upper Abdomen: Multiple nonobstructive calculi are noted within the
collecting systems of both kidneys measuring up to 3 mm in the upper
pole collecting system of the right kidney.

Musculoskeletal: Orthopedic fixation hardware in the cervical spine.
There are no aggressive appearing lytic or blastic lesions noted in
the visualized portions of the skeleton.
IMPRESSION: 1. Left lower lobe pneumonia with endobronchial spread of infection
in the lungs, as above.
2. No findings to suggest interstitial lung disease.
3. Tracheobronchomalacia.
4. Aortic atherosclerosis, in addition to left main and 3 vessel
coronary artery disease. Please note that although the presence of
coronary artery calcium documents the presence of coronary artery
disease, the severity of this disease and any potential stenosis
cannot be assessed on this non-gated CT examination. Assessment for
potential risk factor modification, dietary therapy or pharmacologic
therapy may be warranted, if clinically indicated.
5. Nonobstructive calculi in the collecting systems of both kidneys
measuring up to 3 mm in the upper pole collecting system of the
right kidney.

Aortic Atherosclerosis (K3YAE-TG5.5).

## 2021-07-01 ENCOUNTER — Other Ambulatory Visit (HOSPITAL_COMMUNITY): Payer: Self-pay

## 2021-07-07 ENCOUNTER — Other Ambulatory Visit: Payer: Self-pay

## 2021-07-07 DIAGNOSIS — Z87891 Personal history of nicotine dependence: Secondary | ICD-10-CM

## 2021-07-07 DIAGNOSIS — F17210 Nicotine dependence, cigarettes, uncomplicated: Secondary | ICD-10-CM

## 2021-07-14 ENCOUNTER — Ambulatory Visit: Payer: 59

## 2021-07-14 ENCOUNTER — Other Ambulatory Visit: Payer: Self-pay

## 2021-07-14 DIAGNOSIS — I6522 Occlusion and stenosis of left carotid artery: Secondary | ICD-10-CM

## 2021-07-14 DIAGNOSIS — E103593 Type 1 diabetes mellitus with proliferative diabetic retinopathy without macular edema, bilateral: Secondary | ICD-10-CM | POA: Diagnosis not present

## 2021-07-14 DIAGNOSIS — E1065 Type 1 diabetes mellitus with hyperglycemia: Secondary | ICD-10-CM | POA: Diagnosis not present

## 2021-07-14 DIAGNOSIS — E1029 Type 1 diabetes mellitus with other diabetic kidney complication: Secondary | ICD-10-CM | POA: Diagnosis not present

## 2021-07-15 ENCOUNTER — Ambulatory Visit (HOSPITAL_BASED_OUTPATIENT_CLINIC_OR_DEPARTMENT_OTHER)
Admission: RE | Admit: 2021-07-15 | Discharge: 2021-07-15 | Disposition: A | Discharge status: Home / Self Care | Payer: 59 | Source: Ambulatory Visit | Attending: Acute Care | Admitting: Acute Care

## 2021-07-15 ENCOUNTER — Ambulatory Visit (HOSPITAL_BASED_OUTPATIENT_CLINIC_OR_DEPARTMENT_OTHER): Payer: 59

## 2021-07-15 DIAGNOSIS — Z87891 Personal history of nicotine dependence: Secondary | ICD-10-CM | POA: Insufficient documentation

## 2021-07-15 DIAGNOSIS — F17210 Nicotine dependence, cigarettes, uncomplicated: Secondary | ICD-10-CM | POA: Insufficient documentation

## 2021-07-17 ENCOUNTER — Other Ambulatory Visit (HOSPITAL_COMMUNITY): Payer: Self-pay

## 2021-07-17 ENCOUNTER — Other Ambulatory Visit: Payer: Self-pay | Admitting: Acute Care

## 2021-07-17 DIAGNOSIS — Z87891 Personal history of nicotine dependence: Secondary | ICD-10-CM

## 2021-07-22 DIAGNOSIS — E785 Hyperlipidemia, unspecified: Secondary | ICD-10-CM | POA: Diagnosis not present

## 2021-07-22 DIAGNOSIS — E063 Autoimmune thyroiditis: Secondary | ICD-10-CM | POA: Diagnosis not present

## 2021-07-22 DIAGNOSIS — I6523 Occlusion and stenosis of bilateral carotid arteries: Secondary | ICD-10-CM | POA: Diagnosis not present

## 2021-07-22 DIAGNOSIS — G479 Sleep disorder, unspecified: Secondary | ICD-10-CM | POA: Diagnosis not present

## 2021-07-22 DIAGNOSIS — J441 Chronic obstructive pulmonary disease with (acute) exacerbation: Secondary | ICD-10-CM | POA: Diagnosis not present

## 2021-07-22 DIAGNOSIS — D649 Anemia, unspecified: Secondary | ICD-10-CM | POA: Diagnosis not present

## 2021-07-22 DIAGNOSIS — E103593 Type 1 diabetes mellitus with proliferative diabetic retinopathy without macular edema, bilateral: Secondary | ICD-10-CM | POA: Diagnosis not present

## 2021-07-22 DIAGNOSIS — I2510 Atherosclerotic heart disease of native coronary artery without angina pectoris: Secondary | ICD-10-CM | POA: Diagnosis not present

## 2021-07-22 DIAGNOSIS — I700 Atherosclerosis of aorta: Secondary | ICD-10-CM | POA: Diagnosis not present

## 2021-07-24 ENCOUNTER — Other Ambulatory Visit (HOSPITAL_COMMUNITY): Payer: Self-pay

## 2021-07-24 MED ORDER — SIMVASTATIN 40 MG PO TABS
ORAL_TABLET | ORAL | 3 refills | Status: DC
  Filled 2021-07-24: qty 90, 90d supply, fill #0
  Filled 2021-10-21: qty 90, 90d supply, fill #1
  Filled 2022-01-29: qty 90, 90d supply, fill #2
  Filled 2022-05-04: qty 90, 90d supply, fill #3

## 2021-07-24 MED ORDER — TADALAFIL 10 MG PO TABS
ORAL_TABLET | ORAL | 3 refills | Status: DC
  Filled 2021-07-24: qty 10, 30d supply, fill #0

## 2021-07-25 ENCOUNTER — Other Ambulatory Visit (HOSPITAL_COMMUNITY): Payer: Self-pay

## 2021-07-30 DIAGNOSIS — E1065 Type 1 diabetes mellitus with hyperglycemia: Secondary | ICD-10-CM | POA: Diagnosis not present

## 2021-07-30 DIAGNOSIS — E1029 Type 1 diabetes mellitus with other diabetic kidney complication: Secondary | ICD-10-CM | POA: Diagnosis not present

## 2021-07-30 DIAGNOSIS — E103593 Type 1 diabetes mellitus with proliferative diabetic retinopathy without macular edema, bilateral: Secondary | ICD-10-CM | POA: Diagnosis not present

## 2021-07-31 ENCOUNTER — Ambulatory Visit: Payer: 59 | Admitting: Cardiology

## 2021-07-31 ENCOUNTER — Other Ambulatory Visit: Payer: Self-pay

## 2021-07-31 ENCOUNTER — Encounter: Payer: Self-pay | Admitting: Cardiology

## 2021-07-31 VITALS — BP 130/67 | HR 80 | Temp 98.0°F | Resp 16 | Ht 69.0 in | Wt 184.0 lb

## 2021-07-31 DIAGNOSIS — R0609 Other forms of dyspnea: Secondary | ICD-10-CM | POA: Diagnosis not present

## 2021-07-31 DIAGNOSIS — I2584 Coronary atherosclerosis due to calcified coronary lesion: Secondary | ICD-10-CM | POA: Diagnosis not present

## 2021-07-31 DIAGNOSIS — Z794 Long term (current) use of insulin: Secondary | ICD-10-CM | POA: Diagnosis not present

## 2021-07-31 DIAGNOSIS — E782 Mixed hyperlipidemia: Secondary | ICD-10-CM

## 2021-07-31 DIAGNOSIS — Z87891 Personal history of nicotine dependence: Secondary | ICD-10-CM | POA: Diagnosis not present

## 2021-07-31 DIAGNOSIS — I2510 Atherosclerotic heart disease of native coronary artery without angina pectoris: Secondary | ICD-10-CM | POA: Diagnosis not present

## 2021-07-31 DIAGNOSIS — E1059 Type 1 diabetes mellitus with other circulatory complications: Secondary | ICD-10-CM | POA: Diagnosis not present

## 2021-07-31 NOTE — Progress Notes (Signed)
Date:  07/31/2021   ID:  James [name], DOB 03/12/1956, MRN [id number]  PCP:  [name], MD  Cardiologist:  Rex Kras, DO, [facility] (established care 04/16/2020)  Date: 07/31/21 Last Visit: 08/15/2020 (TeleHealth visit)  Chief Complaint  Patient presents with   Coronary Artery Disease   Follow-up   Results    lab    HPI  James [name] is a 66 y.o. male who presents to the office with a chief complaint of " coronary calcification, shortness of breath, review test results." Patient's past medical history and cardiovascular risk factors include: Insulin-dependent diabetes mellitus type 1, hypothyroidism, former smoker (40-year pack history of smoking), hyperlipidemia,  coronary artery calcification, aortic atherosclerosis.   He is referred to the office at the request of [name], MD for evaluation of coronary artery calcification atherosclerosis of the aorta.  CTA chest in May 2021 noted incidental findings of coronary artery calcification and multiple coronary vessels.  Given his multiple cardiovascular risk factors including insulin-dependent diabetes, former smoker of at least 40-year pack history, advanced age that shared decision was to proceed with left heart catheterization with possible intervention.  Left heart catheterization noted microvascular dysfunction without any significant epicardial coronary artery disease amenable to intervention.  Since then he has been managed with medical therapy and improving his modifiable cardiovascular risk factors to promote secondary prevention.  Since last office visit patient states that he continues to be smoke-free, for which he is congratulated for again at today's visit.  He has been experiencing upper respiratory tract like symptoms and has been followed up with pulmonary medicine.  Patient is now on nebulizer, rescue inhaler.  There is a concern that he also may have aspiration component after his last cervical spine surgery.  He  also complains of chronic orthopnea, paroxysmal nocturnal dyspnea.  No change in overall functional status.  He continues to walk on a treadmill 20% incline is 2.9 mph for at least 22 minutes on a daily basis.  FUNCTIONAL STATUS: Walks on treadmill at 20% at 2.78mh for 22 minutes.   ALLERGIES: Allergies  Allergen Reactions   Augmentin [Amoxicillin-Pot Clavulanate] Nausea And Vomiting and Other (See Comments)    Did it involve swelling of the face/tongue/throat, SOB, or low BP? No Did it involve sudden or severe rash/hives, skin peeling, or any reaction on the inside of your mouth or nose? No Did you need to seek medical attention at a hospital or doctor's office? No When did it last happen?      5+ years If all above answers are NO, may proceed with cephalosporin use.    Lexapro [Escitalopram Oxalate] Itching    MEDICATION LIST PRIOR TO VISIT: Current Meds  Medication Sig   acetaminophen (TYLENOL) 500 MG tablet Take 1,000 mg by mouth every 6 (six) hours as needed for mild pain or headache.   albuterol (PROVENTIL) (2.5 MG/3ML) 0.083% nebulizer solution USE 1 VIAL VIA NEBULIZER EVERY 6 HOURS AS NEEDED FOR WHEEZING OR SHORTNESS OF BREATH AND INHALE INTO LUNGS   albuterol (VENTOLIN HFA) 108 (90 Base) MCG/ACT inhaler INHALE 2 PUFFS BY MOUTH INTO THE LUNGS EVERY 6 HOURS AS NEEDED FOR WHEEZING OR SHORTNESS OF BREATH.   aspirin EC 81 MG tablet Take 1 tablet (81 mg total) by mouth daily. Swallow whole. (Patient taking differently: Take 81 mg by mouth at bedtime. Swallow whole.)   diltiazem (CARDIZEM CD) 180 MG 24 hr capsule Take 1 capsule by mouth daily   fluticasone-salmeterol (ADVAIR) 100-50 MCG/ACT  AEPB Inhale 1 puff into the lungs 2 times a Th[place]ur   Glucagon, rDNA, (GLUCAGON EMERGENCY) 1 MG KIT USE AS NEEDED FOR SEVERE HYPOGLYCEMIA   glucose blood (CONTOUR NEXT TEST) test strip USE 8 TIMES DAILY AS DIRECTED TO MONITOR BLOOD GLUCOSE   ibuprofen (ADVIL) 200 MG tablet Take 800 mg by mouth every  8 (eight) hours as needed (pain.).   insulin glargine-yfgn (SEMGLEE, YFGN,) 100 UNIT/ML Pen Inject 38 units subcutaneously daily as directed   insulin lispro (HUMALOG KWIKPEN) 100 UNIT/ML KwikPen INJECT UP TO 100 UNITS A [place] UNDER THE SKIN AS DIRECTED   levothyroxine (SYNTHROID) 100 MCG tablet TAKE 1 TABLET BY MOUTH ONCE DAILY   metoCLOPramide (REGLAN) 5 MG tablet TAKE 1 TABLET BY MOUTH TWICE DAILY BEFORE A MEAL.   MINOXIDIL EX Apply 1 application topically daily. For hair loss   NON FORMULARY Take 100 mg by mouth daily. hyaluronic acid   pantoprazole (PROTONIX) 40 MG tablet Take 1 tablet by mouth daily for 90 days.   promethazine (PHENERGAN) 25 MG tablet Take 25 mg by mouth every 8 (eight) hours as needed for nausea or vomiting.    simvastatin (ZOCOR) 40 MG tablet TAKE 1 TABLET BY MOUTH ONCE DAILY   sodium chloride HYPERTONIC 3 % nebulizer solution Take by nebulization 2 (two) times daily.   tadalafil (CIALIS) 10 MG tablet Take 1 tablet by mouth as needed   tamsulosin (FLOMAX) 0.4 MG CAPS capsule Take 1 capsule by mouth daily for 90 days.   traZODone (DESYREL) 150 MG tablet TAKE 1 TABLET BY MOUTH AT BEDTIME AS NEEDED FOR SLEEP   varenicline (APO-VARENICLINE) 1 MG tablet TAKE 1 TABLET BY MOUTH 2 TIMES DAILY (Patient taking differently: Take by mouth daily.)   [DISCONTINUED] simvastatin (ZOCOR) 40 MG tablet TAKE 1 TABLET BY MOUTH ONCE A [place]an     PAST MEDICAL HISTORY: Past Medical History:  Diagnosis Date   ANXIETY 04/03/2007   Anxiety    ASTHMA 09/06/2008   Asthma    ASTHMATIC BRONCHITIS, ACUTE 10/25/2008   Bladder neck obstruction    CARPAL TUNNEL SYNDROME, BILATERAL 07/31/2007   issues resolved, no surgery   Cervical disc disease    Chronic bronchitis ([place])    "get it about q yr" (02/12/2014)   COPD (chronic obstructive pulmonary disease) ([place])    CORONARY ARTERY DISEASE 04/03/2007   DEPRESSION 04/03/2007   Depression    DIABETES MELLITUS, TYPE I 04/03/2007   Diabetic retinopathy  associated with diabetes mellitus due to underlying condition ([place]) 04/03/2007   DM W/EYE MANIFESTATIONS, TYPE I, UNCONTROLLED 04/04/2007   DM W/RENAL MNFST, TYPE I, UNCONTROLLED 04/04/2007   ED (erectile dysfunction)    History of kidney stones    HYPERLIPIDEMIA 04/04/2007   Pneumonia    "several times and again today" (02/13/2104)   Renal insufficiency    Seizures ([place])    "insulin seizure from time to time; none in the last couple years" (02/12/2014)   Spinal stenosis     PAST SURGICAL HISTORY: Past Surgical History:  Procedure Laterality Date   ANTERIOR CERVICAL DECOMP/DISCECTOMY FUSION  2000   "couple screws and a plate"   ANTERIOR CERVICAL DECOMP/DISCECTOMY FUSION N/A 06/24/2016   Procedure: ANTERIOR CERVICAL DECOMPRESSION/DISCECTOMY FUSION CERVICAL FOUR - CERVICAL FIVE, CERVICAL FIVE - CERVICAL SIX; REMOVAL TETHER CERVICAL PLATE;  Surgeon: Jovita Gamma, MD;  Location: [facility];  Service: Neurosurgery;  Laterality: N/A;  ANTERIOR CERVICAL DECOMPRESSION/DISCECTOMY FUSION CERVICAL FOUR - CERVICAL FIVE, CERVICAL FIVE - CERVICAL SIX; REMOVAL TETHER CERVICAL PLATE  APPENDECTOMY     BACK SURGERY     CARDIAC CATHETERIZATION  1990's   CATARACT EXTRACTION W/ INTRAOCULAR LENS  IMPLANT, BILATERAL Bilateral    CYSTOSCOPY WITH RETROGRADE PYELOGRAM, URETEROSCOPY AND STENT PLACEMENT Bilateral 04/06/2013   Procedure: BILATERAL CYSTOSCOPY WITH RETROGRADE PYELOGRAMS, STENT PLACEMENTS AND LEFT URETEROSCOPY AND STONE REMOVAL;  Surgeon: Alexis Frock, MD;  Location: [facility];  Service: Urology;  Laterality: Bilateral;   CYSTOSCOPY WITH STENT PLACEMENT Right 04/12/2013   Procedure: CYSTOSCOPY WITH STENT PLACEMENT;  Surgeon: Alexis Frock, MD;  Location: [facility];  Service: Urology;  Laterality: Right;   CYSTOSCOPY/RETROGRADE/URETEROSCOPY Bilateral 04/12/2013   Procedure: CYSTOSCOPY/RETROGRADE/URETEROSCOPY;  Surgeon: Alexis Frock, MD;  Location: [facility];  Service: Urology;  Laterality: Bilateral;  RIGHT  RETROGRADE    HOLMIUM LASER APPLICATION Left 7/67/3419   Procedure: HOLMIUM LASER APPLICATION;  Surgeon: Alexis Frock, MD;  Location: [facility];  Service: Urology;  Laterality: Left;   LEFT HEART CATH AND CORONARY ANGIOGRAPHY N/A 04/29/2020   Procedure: LEFT HEART CATH AND CORONARY ANGIOGRAPHY;  Surgeon: Nigel Mormon, MD;  Location: [facility];  Service: Cardiovascular;  Laterality: N/A;   LUMBAR LAMINECTOMY/DECOMPRESSION MICRODISCECTOMY N/A 04/20/2019   Procedure: Lumbar microdisectomy and decompression L5-S1 left;  Surgeon: Latanya Maudlin, MD;  Location: [facility];  Service: Orthopedics;  Laterality: N/A;  84mn   LYMPH NODE DISSECTION  ~ 1960   groin   stress cardiolite  09/06/2002   TONSILLECTOMY     VITRECTOMY Bilateral     FAMILY HISTORY: The patient family history includes Cancer in his mother; Diabetes in his brother; Heart disease in his brother; Stroke in his father.  SOCIAL HISTORY:  The patient  reports that he quit smoking about 1 years ago. His smoking use included cigarettes. He has never used smokeless tobacco. He reports that he does not drink alcohol and does not use drugs.  REVIEW OF SYSTEMS: Review of Systems  Constitutional: Negative for chills and fever.  HENT:  Negative for hoarse voice and nosebleeds.   Eyes:  Negative for discharge, double vision and pain.  Cardiovascular:  Positive for dyspnea on exertion, orthopnea and paroxysmal nocturnal dyspnea. Negative for chest pain, claudication, leg swelling, near-syncope, palpitations and syncope.  Respiratory:  Negative for hemoptysis and shortness of breath.   Musculoskeletal:  Negative for muscle cramps and myalgias.  Gastrointestinal:  Negative for abdominal pain, constipation, diarrhea, hematemesis, hematochezia, melena, nausea and vomiting.  Neurological:  Negative for dizziness and light-headedness.   PHYSICAL EXAM: Vitals with BMI 07/31/2021 07/15/2021 12/10/2020  Height _0  _1  _2   Weight  184 lbs 184 lbs 186 lbs  BMI 27.16 [id number] Systolic 1735- 1329 Diastolic 67 - 68  Pulse 80 - 72    CONSTITUTIONAL: Well-developed and well-nourished. No acute distress.  SKIN: Skin is warm and dry. No rash noted. No cyanosis. No pallor. No jaundice HEAD: Normocephalic and atraumatic.  EYES: No scleral icterus MOUTH/THROAT: Moist oral membranes.  NECK: No JVD present. No thyromegaly noted. Left carotid bruit.  LYMPHATIC: No visible cervical adenopathy.  CHEST Normal respiratory effort. No intercostal retractions  LUNGS: Clear to auscultation bilaterally. No stridor. No wheezes. No rales.  CARDIOVASCULAR: Regular rate and rhythm, positive S1-S2, no murmurs rubs or gallops appreciated. ABDOMINAL: No apparent ascites.  EXTREMITIES: No peripheral edema, decreased DP and PT bilaterally.  HEMATOLOGIC: No significant bruising NEUROLOGIC: Oriented to person, place, and time. Nonfocal. Normal muscle tone.  PSYCHIATRIC: Normal mood and affect. Normal behavior. Cooperative  CARDIAC  DATABASE: EKG: 07/31/2021: Normal sinus rhythm, 73 bpm, normal axis, without underlying ischemia or injury pattern.  Echocardiogram: 04/22/2020: Left ventricle cavity is normal in size. Moderate concentric hypertrophy of the left ventricle. Normal global wall motion. Normal LV systolic function with EF 55%. Doppler evidence of grade I (impaired) diastolic dysfunction, normal LAP. Mild tricuspid regurgitation. No evidence of pulmonary hypertension.   Stress Testing: No results found for this or any previous visit from the past 1095 days.  Heart Catheterization:  04/29/2020: LM: Normal LAD: 75% stenosis in mid small caliber distal LAD. TIMI II flow, only marginally improved after IC NTG 200 mg. LCx: Large dominant vessel with TIMI II flow, only marginally improved after IC NTG 200 mg RCA: Small non-dominant vessel with TIMI II flow Suspect microvascular dysfunction. Consider addition of calcium channel  blocker.   Carotid artery duplex 54/36/0677:  Peak systolic velocities in the right bifurcation, internal, external and common carotid arteries are within normal limits.  There is mild  heterogeneous plaque noted in the right carotid artery.  Stenosis in the left internal carotid artery (16-49%). Stenosis in the left external carotid artery (<50%). Heterogeneous plaque.  Antegrade right vertebral artery flow. Antegrade left vertebral artery flow.  Follow up in one year is appropriate if clinically indicated.  Carotid artery duplex 07/09/2021:  Duplex suggests stenosis in the right internal carotid artery (minimal).  Duplex suggests stenosis in the left internal carotid artery (minimal).  Duplex suggests stenosis in the left external carotid artery (<50%).  Antegrade right vertebral artery flow. Antegrade left vertebral artery flow  Compared to the study 04/27/20, mild left ICA stenosis of 15-49% no longer present, further studies if clinically indicated.  LABORATORY DATA: CBC Latest Ref Rng & Units 07/31/2021 04/29/2020 04/22/2020  WBC 3.4 - 10.8 x10E3/uL - - 4.0  Hemoglobin 13.0 - 17.7 g/dL 14.9 14.3 15.8  Hematocrit 37.5 - 51.0 % 42.3 42.0 46.1  Platelets 150 - 450 x10E3/uL - - 266    CMP Latest Ref Rng & Units 07/31/2021 04/29/2020 04/29/2020  Glucose 70 - 99 mg/dL 89 115(H) 114(H)  BUN 8 - 27 mg/dL _0 Creatinine 0.76 - 1.27 mg/dL 1.09 0.80 0.86  Sodium 134 - 144 mmol/L 137 140 138  Potassium 3.5 - 5.2 mmol/L 4.5 4.3 4.5  Chloride 96 - 106 mmol/L 100 104 107  CO2 20 - 29 mmol/L 21 - 23  Calcium 8.6 - 10.2 mg/dL 9.4 - 8.8(L)  Total Protein 6.5 - 8.1 g/dL - - -  Total Bilirubin 0.3 - 1.2 mg/dL - - -  Alkaline Phos 38 - 126 U/L - - -  AST 15 - 41 U/L - - -  ALT 0 - 44 U/L - - -    Lipid Panel     Component Value Date/Time   CHOL 124 05/08/2010 1434   TRIG 38.0 05/08/2010 1434   HDL 54.20 05/08/2010 1434   CHOLHDL 2 05/08/2010 1434   VLDL 7.6 05/08/2010 1434    LDLCALC 62 05/08/2010 1434    No components found for: NTPROBNP Recent Labs    07/31/21 1120  PROBNP 42   No results for input(s): TSH in the last 8760 hours.  BMP Recent Labs    07/31/21 1120  NA 137  K 4.5  CL 100  CO2 21  GLUCOSE 89  BUN 17  CREATININE 1.09  CALCIUM 9.4     HEMOGLOBIN A1C Lab Results  Component Value Date   HGBA1C 6.5 (H) 10/26/2019  MPG 140 10/26/2019   External Labs: Collected: 01/23/2020 Creatinine 1 mg/dL. eGFR: 75 mL/min per 1.73 m Potassium 4.6 TSH: 4.92 and free T4 110 ng/dL   IMPRESSION:    ICD-10-CM   1. Dyspnea on exertion  R06.09 PCV ECHOCARDIOGRAM COMPLETE    Basic metabolic panel    Hemoglobin and hematocrit, blood    Magnesium    Pro b natriuretic peptide (BNP)    EKG 12-Lead    2. Coronary atherosclerosis due to calcified coronary lesion of native artery  I25.10 EKG 12-Lead   I25.84     3. Type 1 diabetes mellitus with other circulatory complication (HCC)  H08.65     4. Long-term insulin use (HCC)  Z79.4     5. Former smoker  Z87.891     69. Mixed hyperlipidemia  E78.2        RECOMMENDATIONS: James [name] is a 66 y.o. male whose past medical history and cardiac risk factors include: Insulin-dependent diabetes mellitus type 1, hypothyroidism, former smoker (40-year pack history of smoking), hyperlipidemia,  coronary artery calcification, aortic atherosclerosis.   Dyspnea on exertion Appears to be euvolemic on physical examination. Has chronic orthopnea and paroxysmal nocturnal dyspnea and continues to sleep in a recliner. Currently being worked up by pulmonary medicine Check BNP to evaluate volume status, H&H for anemia, and BMP Repeat echocardiogram to reevaluate LVEF and diastolic function. No change in overall physical endurance. Further recommendations to follow.  Coronary atherosclerosis due to calcified coronary lesion of native artery/microvascular dysfunction: Free of angina pectoris Medications  reconciled. Echo: LVEF preserved, grade 1 diastolic impairment Left heart catheterization: No significant epicardial coronary artery disease with findings consistent of microvascular dysfunction  Mixed hyperlipidemia Continue statin therapy, managed by PCP.  Recommend 38-monthfollow-up visit; however, if the labs are abnormal or there is significant change in echocardiogram sooner appointment will be requested.  FINAL MEDICATION LIST END OF ENCOUNTER: No orders of the defined types were placed in this encounter.    Current Outpatient Medications:    acetaminophen (TYLENOL) 500 MG tablet, Take 1,000 mg by mouth every 6 (six) hours as needed for mild pain or headache., Disp: , Rfl:    albuterol (PROVENTIL) (2.5 MG/3ML) 0.083% nebulizer solution, USE 1 VIAL VIA NEBULIZER EVERY 6 HOURS AS NEEDED FOR WHEEZING OR SHORTNESS OF BREATH AND INHALE INTO LUNGS, Disp: 180 mL, Rfl: 3   albuterol (VENTOLIN HFA) 108 (90 Base) MCG/ACT inhaler, INHALE 2 PUFFS BY MOUTH INTO THE LUNGS EVERY 6 HOURS AS NEEDED FOR WHEEZING OR SHORTNESS OF BREATH., Disp: 18 g, Rfl: 2   aspirin EC 81 MG tablet, Take 1 tablet (81 mg total) by mouth daily. Swallow whole. (Patient taking differently: Take 81 mg by mouth at bedtime. Swallow whole.), Disp: 30 tablet, Rfl: 11   diltiazem (CARDIZEM CD) 180 MG 24 hr capsule, Take 1 capsule by mouth daily, Disp: 90 capsule, Rfl: 0   fluticasone-salmeterol (ADVAIR) 100-50 MCG/ACT AEPB, Inhale 1 puff into the lungs 2 times a [place], Disp: 60 each, Rfl: 10   Glucagon, rDNA, (GLUCAGON EMERGENCY) 1 MG KIT, USE AS NEEDED FOR SEVERE HYPOGLYCEMIA, Disp: 2 kit, Rfl: 12   glucose blood (CONTOUR NEXT TEST) test strip, USE 8 TIMES DAILY AS DIRECTED TO MONITOR BLOOD GLUCOSE, Disp: 600 strip, Rfl: 3   ibuprofen (ADVIL) 200 MG tablet, Take 800 mg by mouth every 8 (eight) hours as needed (pain.)., Disp: , Rfl:    insulin glargine-yfgn (SEMGLEE, YFGN,) 100 UNIT/ML Pen, Inject 38 units subcutaneously daily  as  directed, Disp: 45 mL, Rfl: 3   insulin lispro (HUMALOG KWIKPEN) 100 UNIT/ML KwikPen, INJECT UP TO 100 UNITS A [place] UNDER THE SKIN AS DIRECTED, Disp: 45 mL, Rfl: 5   levothyroxine (SYNTHROID) 100 MCG tablet, TAKE 1 TABLET BY MOUTH ONCE DAILY, Disp: 90 tablet, Rfl: 3   metoCLOPramide (REGLAN) 5 MG tablet, TAKE 1 TABLET BY MOUTH TWICE DAILY BEFORE A MEAL., Disp: 180 tablet, Rfl: 3   MINOXIDIL EX, Apply 1 application topically daily. For hair loss, Disp: , Rfl:    NON FORMULARY, Take 100 mg by mouth daily. hyaluronic acid, Disp: , Rfl:    pantoprazole (PROTONIX) 40 MG tablet, Take 1 tablet by mouth daily for 90 days., Disp: 90 tablet, Rfl: 3   promethazine (PHENERGAN) 25 MG tablet, Take 25 mg by mouth every 8 (eight) hours as needed for nausea or vomiting. , Disp: , Rfl:    simvastatin (ZOCOR) 40 MG tablet, TAKE 1 TABLET BY MOUTH ONCE DAILY, Disp: 90 tablet, Rfl: 3   sodium chloride HYPERTONIC 3 % nebulizer solution, Take by nebulization 2 (two) times daily., Disp: 240 mL, Rfl: 3   tadalafil (CIALIS) 10 MG tablet, Take 1 tablet by mouth as needed, Disp: 10 tablet, Rfl: 3   tamsulosin (FLOMAX) 0.4 MG CAPS capsule, Take 1 capsule by mouth daily for 90 days., Disp: 90 capsule, Rfl: 3   traZODone (DESYREL) 150 MG tablet, TAKE 1 TABLET BY MOUTH AT BEDTIME AS NEEDED FOR SLEEP, Disp: 90 tablet, Rfl: 1   varenicline (APO-VARENICLINE) 1 MG tablet, TAKE 1 TABLET BY MOUTH 2 TIMES DAILY (Patient taking differently: Take by mouth daily.), Disp: 60 tablet, Rfl: 3   influenza vaccine adjuvanted (FLUAD) 0.5 ML injection, Inject into the muscle., Disp: 0.5 mL, Rfl: 0   Insulin Pen Needle 31G X 6 MM MISC, Use as directed four times daily with insulin pen, Disp: 400 each, Rfl: 3  Orders Placed This Encounter  Procedures   Basic metabolic panel   Hemoglobin and hematocrit, blood   Magnesium   Pro b natriuretic peptide (BNP)   EKG 12-Lead   PCV ECHOCARDIOGRAM COMPLETE    There are no Patient Instructions on file  for this visit.   --Continue cardiac medications as reconciled in final medication list. --Return in about 6 months (around 01/28/2022) for Follow up, Dyspnea. Or sooner if needed. --Continue follow-up with your primary care physician regarding the management of your other chronic comorbid conditions.  Patient's questions and concerns were addressed to his satisfaction. He voices understanding of the instructions provided during this encounter.   This note was created using a voice recognition software as a result there may be grammatical errors inadvertently enclosed that do not reflect the nature of this encounter. Every attempt is made to correct such errors.  Rex Kras, [place], [facility]  Pager: [phone] Office: [phone]

## 2021-08-01 LAB — BASIC METABOLIC PANEL
BUN/Creatinine Ratio: 16 (ref 10–24)
BUN: 17 mg/dL (ref 8–27)
CO2: 21 mmol/L (ref 20–29)
Calcium: 9.4 mg/dL (ref 8.6–10.2)
Chloride: 100 mmol/L (ref 96–106)
Creatinine, Ser: 1.09 mg/dL (ref 0.76–1.27)
Glucose: 89 mg/dL (ref 70–99)
Potassium: 4.5 mmol/L (ref 3.5–5.2)
Sodium: 137 mmol/L (ref 134–144)
eGFR: 75 mL/min/{1.73_m2} (ref >59)

## 2021-08-01 LAB — HEMOGLOBIN AND HEMATOCRIT, BLOOD
Hematocrit: 42.3 % (ref 37.5–51.0)
Hemoglobin: 14.9 g/dL (ref 13.0–17.7)

## 2021-08-01 LAB — PRO B NATRIURETIC PEPTIDE: NT-Pro BNP: 42 pg/mL (ref 0–376)

## 2021-08-01 LAB — MAGNESIUM: Magnesium: 2.1 mg/dL (ref 1.6–2.3)

## 2021-08-02 ENCOUNTER — Other Ambulatory Visit: Payer: Self-pay | Admitting: Cardiology

## 2021-08-02 ENCOUNTER — Other Ambulatory Visit (HOSPITAL_COMMUNITY): Payer: Self-pay

## 2021-08-03 ENCOUNTER — Other Ambulatory Visit (HOSPITAL_COMMUNITY): Payer: Self-pay

## 2021-08-03 MED ORDER — TRAZODONE HCL 150 MG PO TABS
150.0000 mg | ORAL_TABLET | Freq: Every evening | ORAL | 1 refills | Status: DC | PRN
  Filled 2021-08-03: qty 90, 90d supply, fill #0
  Filled 2021-10-29: qty 90, 90d supply, fill #1

## 2021-08-03 MED ORDER — DILTIAZEM HCL ER COATED BEADS 180 MG PO CP24
ORAL_CAPSULE | ORAL | 0 refills | Status: DC
  Filled 2021-08-03: qty 90, 90d supply, fill #0

## 2021-08-03 NOTE — Progress Notes (Signed)
Attempted to call pt, no answer. Left vm requesting call back.

## 2021-08-04 ENCOUNTER — Other Ambulatory Visit (HOSPITAL_COMMUNITY): Payer: Self-pay

## 2021-08-04 NOTE — Progress Notes (Signed)
Attempted to call pt, no answer. Left vm requesting call back.

## 2021-08-05 NOTE — Progress Notes (Signed)
Attempted to call pt, no answer. Left vm requesting call back.

## 2021-08-07 ENCOUNTER — Other Ambulatory Visit (HOSPITAL_COMMUNITY): Payer: Self-pay

## 2021-08-10 ENCOUNTER — Ambulatory Visit: Payer: 59 | Admitting: Cardiology

## 2021-08-14 ENCOUNTER — Other Ambulatory Visit (HOSPITAL_COMMUNITY): Payer: Self-pay

## 2021-08-18 ENCOUNTER — Other Ambulatory Visit (HOSPITAL_COMMUNITY): Payer: Self-pay

## 2021-08-24 ENCOUNTER — Other Ambulatory Visit (HOSPITAL_COMMUNITY): Payer: Self-pay

## 2021-08-31 ENCOUNTER — Other Ambulatory Visit (HOSPITAL_COMMUNITY): Payer: Self-pay

## 2021-08-31 MED FILL — Metoclopramide HCl Tab 5 MG (Base Equivalent): ORAL | 90 days supply | Qty: 180 | Fill #2 | Status: AC

## 2021-09-04 ENCOUNTER — Other Ambulatory Visit (HOSPITAL_COMMUNITY): Payer: Self-pay

## 2021-09-05 ENCOUNTER — Other Ambulatory Visit (HOSPITAL_COMMUNITY): Payer: Self-pay

## 2021-09-07 ENCOUNTER — Other Ambulatory Visit (HOSPITAL_COMMUNITY): Payer: Self-pay

## 2021-09-07 MED ORDER — INSULIN PEN NEEDLE 31G X 8 MM MISC
3 refills | Status: AC
  Filled 2021-09-07 (×2): qty 500, 90d supply, fill #0
  Filled 2021-12-07: qty 500, 90d supply, fill #1
  Filled 2022-04-20: qty 500, 90d supply, fill #2
  Filled 2022-08-09: qty 500, 90d supply, fill #3

## 2021-09-08 ENCOUNTER — Other Ambulatory Visit (HOSPITAL_COMMUNITY): Payer: Self-pay

## 2021-09-09 ENCOUNTER — Other Ambulatory Visit (HOSPITAL_COMMUNITY): Payer: Self-pay

## 2021-09-09 ENCOUNTER — Other Ambulatory Visit (HOSPITAL_BASED_OUTPATIENT_CLINIC_OR_DEPARTMENT_OTHER): Payer: Self-pay

## 2021-09-09 ENCOUNTER — Other Ambulatory Visit: Payer: Self-pay | Admitting: Pulmonary Disease

## 2021-09-09 MED ORDER — ALBUTEROL SULFATE (2.5 MG/3ML) 0.083% IN NEBU
INHALATION_SOLUTION | RESPIRATORY_TRACT | 3 refills | Status: DC
  Filled 2021-09-09: qty 180, 15d supply, fill #0

## 2021-09-09 MED ORDER — IPRATROPIUM-ALBUTEROL 0.5-2.5 (3) MG/3ML IN SOLN
RESPIRATORY_TRACT | 3 refills | Status: DC
Start: 2021-09-09 — End: 2021-11-05
  Filled 2021-09-09: qty 180, 30d supply, fill #0

## 2021-09-11 ENCOUNTER — Other Ambulatory Visit (HOSPITAL_COMMUNITY): Payer: Self-pay

## 2021-09-16 ENCOUNTER — Other Ambulatory Visit (HOSPITAL_COMMUNITY): Payer: Self-pay

## 2021-09-16 MED ORDER — PENICILLIN V POTASSIUM 500 MG PO TABS
500.0000 mg | ORAL_TABLET | Freq: Four times a day (QID) | ORAL | 0 refills | Status: DC
  Filled 2021-09-16 (×2): qty 28, 7d supply, fill #0

## 2021-09-16 MED ORDER — HYDROCODONE-ACETAMINOPHEN 5-325 MG PO TABS
ORAL_TABLET | ORAL | 0 refills | Status: DC
  Filled 2021-09-16: qty 14, 3d supply, fill #0

## 2021-09-22 DIAGNOSIS — E103593 Type 1 diabetes mellitus with proliferative diabetic retinopathy without macular edema, bilateral: Secondary | ICD-10-CM | POA: Diagnosis not present

## 2021-09-22 DIAGNOSIS — Z794 Long term (current) use of insulin: Secondary | ICD-10-CM | POA: Diagnosis not present

## 2021-09-22 DIAGNOSIS — I2510 Atherosclerotic heart disease of native coronary artery without angina pectoris: Secondary | ICD-10-CM | POA: Diagnosis not present

## 2021-10-06 ENCOUNTER — Other Ambulatory Visit: Payer: Self-pay | Admitting: Pulmonary Disease

## 2021-10-06 ENCOUNTER — Other Ambulatory Visit (HOSPITAL_COMMUNITY): Payer: Self-pay

## 2021-10-07 ENCOUNTER — Other Ambulatory Visit (HOSPITAL_COMMUNITY): Payer: Self-pay

## 2021-10-07 MED FILL — Fluticasone-Salmeterol Aer Powder BA 100-50 MCG/ACT: RESPIRATORY_TRACT | 30 days supply | Qty: 60 | Fill #0 | Status: AC

## 2021-10-12 ENCOUNTER — Other Ambulatory Visit (HOSPITAL_COMMUNITY): Payer: Self-pay

## 2021-10-21 ENCOUNTER — Other Ambulatory Visit (HOSPITAL_COMMUNITY): Payer: Self-pay

## 2021-10-26 ENCOUNTER — Other Ambulatory Visit (HOSPITAL_COMMUNITY): Payer: Self-pay

## 2021-10-28 ENCOUNTER — Telehealth: Payer: Self-pay | Admitting: Pulmonary Disease

## 2021-10-28 ENCOUNTER — Other Ambulatory Visit (HOSPITAL_COMMUNITY): Payer: Self-pay

## 2021-10-28 MED ORDER — AZITHROMYCIN 250 MG PO TABS
ORAL_TABLET | ORAL | 0 refills | Status: DC
  Filled 2021-10-28: qty 6, 5d supply, fill #0

## 2021-10-28 NOTE — Telephone Encounter (Signed)
Called and spoke with patient to let him know that we are sending in an order for z-pak. Asked patient if he is taking an OTC antihistamine and he states that he takes Cetirizine, fluticasone nasal spray and Saline nasal spray everyday. I have added those 3 medications to the patient's med list. RX sent. Nothing further needed at this time. ?

## 2021-10-28 NOTE — Telephone Encounter (Signed)
Called and spoke with patient who states that 2 days ago he started having symptoms of sore throat, dry cough and mucus. Today he has bright green mucus. Sore throat and cough worse at night. No fever at first thought it was just allergies until this morning. Patient would like Z pak called into pharmacy. Pharmacy is [facility]. ? ?Dr. Elsworth Soho please advise ?

## 2021-10-29 ENCOUNTER — Other Ambulatory Visit: Payer: Self-pay | Admitting: Cardiology

## 2021-10-29 ENCOUNTER — Other Ambulatory Visit (HOSPITAL_COMMUNITY): Payer: Self-pay

## 2021-10-30 ENCOUNTER — Other Ambulatory Visit (HOSPITAL_COMMUNITY): Payer: Self-pay

## 2021-10-30 MED ORDER — DILTIAZEM HCL ER COATED BEADS 180 MG PO CP24
ORAL_CAPSULE | ORAL | 0 refills | Status: DC
  Filled 2021-10-30: qty 90, 90d supply, fill #0

## 2021-10-31 ENCOUNTER — Other Ambulatory Visit (HOSPITAL_COMMUNITY): Payer: Self-pay

## 2021-11-02 ENCOUNTER — Telehealth: Payer: Self-pay | Admitting: Pulmonary Disease

## 2021-11-02 ENCOUNTER — Ambulatory Visit: Payer: 59 | Admitting: Pulmonary Disease

## 2021-11-02 ENCOUNTER — Other Ambulatory Visit (HOSPITAL_COMMUNITY): Payer: Self-pay

## 2021-11-02 ENCOUNTER — Encounter: Payer: Self-pay | Admitting: Pulmonary Disease

## 2021-11-02 VITALS — BP 126/68 | HR 71 | Temp 97.6°F | Ht 69.0 in | Wt 178.4 lb

## 2021-11-02 DIAGNOSIS — J4541 Moderate persistent asthma with (acute) exacerbation: Secondary | ICD-10-CM

## 2021-11-02 DIAGNOSIS — R062 Wheezing: Secondary | ICD-10-CM

## 2021-11-02 DIAGNOSIS — R0602 Shortness of breath: Secondary | ICD-10-CM

## 2021-11-02 MED ORDER — METHYLPREDNISOLONE ACETATE 80 MG/ML IJ SUSP
80.0000 mg | Freq: Once | INTRAMUSCULAR | Status: AC
  Administered 2021-11-02: 80 mg via INTRAMUSCULAR

## 2021-11-02 MED ORDER — DOXYCYCLINE HYCLATE 100 MG PO TABS
100.0000 mg | ORAL_TABLET | Freq: Two times a day (BID) | ORAL | 0 refills | Status: DC
  Filled 2021-11-02: qty 14, 7d supply, fill #0

## 2021-11-02 MED ORDER — PREDNISONE 10 MG PO TABS
ORAL_TABLET | ORAL | 0 refills | Status: DC
  Filled 2021-11-02: qty 40, 16d supply, fill #0

## 2021-11-02 NOTE — Progress Notes (Signed)
? ?Synopsis: Referred in April 2023 for cough SOB by [name], MD ? ?Subjective:  ? ?PATIENT ID: James [name] GENDER: male DOB: [birth year], MRN: [id number] ? ?Chief Complaint  ?Patient presents with  ? Follow-up  ?  Follow up. Patient has been sick for a week and says he is getting worse.   ? ? ?This is a 66 year old male, past medical history of asthma, diabetes.  Patient had a flare starting last week mowed the grass without wearing a mask.  Started developing upper respiratory symptoms nasal congestion posterior nasal drip sore throat.  Ultimately went into his chest, increased shortness of breath cough sputum production chest tightness and wheezing.  He has had several bad nights unable to sleep.  Was started on a Z-Pak.  He completed this.  Has not used any steroids.  He is still using his steroid inhaler as well as nebulizer.  He has been using his nebulizer now 3 times a Salih.  He has not had any exacerbations in the past year. ? ? ?Past Medical History:  ?Diagnosis Date  ? ANXIETY 04/03/2007  ? Anxiety   ? ASTHMA 09/06/2008  ? Asthma   ? ASTHMATIC BRONCHITIS, ACUTE 10/25/2008  ? Bladder neck obstruction   ? CARPAL TUNNEL SYNDROME, BILATERAL 07/31/2007  ? issues resolved, no surgery  ? Cervical disc disease   ? Chronic bronchitis ([place])   ? "get it about q yr" (02/12/2014)  ? COPD (chronic obstructive pulmonary disease) ([place])   ? CORONARY ARTERY DISEASE 04/03/2007  ? DEPRESSION 04/03/2007  ? Depression   ? DIABETES MELLITUS, TYPE I 04/03/2007  ? Diabetic retinopathy associated with diabetes mellitus due to underlying condition ([place]) 04/03/2007  ? DM W/EYE MANIFESTATIONS, TYPE I, UNCONTROLLED 04/04/2007  ? DM W/RENAL MNFST, TYPE I, UNCONTROLLED 04/04/2007  ? ED (erectile dysfunction)   ? History of kidney stones   ? HYPERLIPIDEMIA 04/04/2007  ? Pneumonia   ? "several times and again today" (02/13/2104)  ? Renal insufficiency   ? Seizures ([place])   ? "insulin seizure from time to time; none in the last couple years"  (02/12/2014)  ? Spinal stenosis   ?  ? ?Family History  ?Problem Relation Age of Onset  ? Stroke Father   ?     strong FH cerebrovascular disease  ? Diabetes Brother   ? Heart disease Brother   ?     CHF  ? Cancer Mother   ? Colon cancer Neg Hx   ?  ? ?Past Surgical History:  ?Procedure Laterality Date  ? ANTERIOR CERVICAL DECOMP/DISCECTOMY FUSION  2000  ? "couple screws and a plate"  ? ANTERIOR CERVICAL DECOMP/DISCECTOMY FUSION N/A 06/24/2016  ? Procedure: ANTERIOR CERVICAL DECOMPRESSION/DISCECTOMY FUSION CERVICAL FOUR - CERVICAL FIVE, CERVICAL FIVE - CERVICAL SIX; REMOVAL TETHER CERVICAL PLATE;  Surgeon: Jovita Gamma, MD;  Location: [facility];  Service: Neurosurgery;  Laterality: N/A;  ANTERIOR CERVICAL DECOMPRESSION/DISCECTOMY FUSION CERVICAL FOUR - CERVICAL FIVE, CERVICAL FIVE - CERVICAL SIX; REMOVAL TETHER CERVICAL PLATE  ? APPENDECTOMY    ? BACK SURGERY    ? CARDIAC CATHETERIZATION  1990's  ? CATARACT EXTRACTION W/ INTRAOCULAR LENS  IMPLANT, BILATERAL Bilateral   ? CYSTOSCOPY WITH RETROGRADE PYELOGRAM, URETEROSCOPY AND STENT PLACEMENT Bilateral 04/06/2013  ? Procedure: BILATERAL CYSTOSCOPY WITH RETROGRADE PYELOGRAMS, STENT PLACEMENTS AND LEFT URETEROSCOPY AND STONE REMOVAL;  Surgeon: Alexis Frock, MD;  Location: [facility];  Service: Urology;  Laterality: Bilateral;  ? CYSTOSCOPY WITH STENT PLACEMENT Right 04/12/2013  ? Procedure: CYSTOSCOPY WITH STENT  PLACEMENT;  Surgeon: Alexis Frock, MD;  Location: [facility];  Service: Urology;  Laterality: Right;  ? CYSTOSCOPY/RETROGRADE/URETEROSCOPY Bilateral 04/12/2013  ? Procedure: CYSTOSCOPY/RETROGRADE/URETEROSCOPY;  Surgeon: Alexis Frock, MD;  Location: [facility];  Service: Urology;  Laterality: Bilateral;  RIGHT RETROGRADE ?  ? HOLMIUM LASER APPLICATION Left 02/04/9469  ? Procedure: HOLMIUM LASER APPLICATION;  Surgeon: Alexis Frock, MD;  Location: [facility];  Service: Urology;  Laterality: Left;  ? LEFT HEART CATH AND CORONARY ANGIOGRAPHY N/A 04/29/2020  ? Procedure: LEFT HEART  CATH AND CORONARY ANGIOGRAPHY;  Surgeon: Nigel Mormon, MD;  Location: [facility];  Service: Cardiovascular;  Laterality: N/A;  ? LUMBAR LAMINECTOMY/DECOMPRESSION MICRODISCECTOMY N/A 04/20/2019  ? Procedure: Lumbar microdisectomy and decompression L5-S1 left;  Surgeon: Latanya Maudlin, MD;  Location: [facility];  Service: Orthopedics;  Laterality: N/A;  59mn  ? LYMPH NODE DISSECTION  ~ 1960  ? groin  ? stress cardiolite  09/06/2002  ? TONSILLECTOMY    ? VITRECTOMY Bilateral   ? ? ?Social History  ? ?Socioeconomic History  ? Marital status: Married  ?  Spouse name: Not on file  ? Number of children: 2  ? Years of education: Not on file  ? Highest education level: Not on file  ?Occupational History  ?  Employer: UNEMPLOYED  ?  Comment: owns a lBiomedical scientistbusiness  ?Tobacco Use  ? Smoking status: Former  ?  Packs/Early: 0.00  ?  Types: Cigarettes  ?  Quit date: 08/20/2019  ?  Years since quitting: 2.2  ? Smokeless tobacco: Never  ? Tobacco comments:  ?  Successfully quit smoking 08/20/2019  ?Vaping Use  ? Vaping Use: Never used  ?Substance and Sexual Activity  ? Alcohol use: No  ? Drug use: No  ? Sexual activity: Not Currently  ?Other Topics Concern  ? Not on file  ?Social History Narrative  ? Insurance coverage for [place]  ? ?Social Determinants of Health  ? ?Financial Resource Strain: Not on file  ?Food Insecurity: Not on file  ?Transportation Needs: Not on file  ?Physical Activity: Not on file  ?Stress: Not on file  ?Social Connections: Not on file  ?Intimate Partner Violence: Not on file  ?  ? ?Allergies  ?Allergen Reactions  ? Augmentin [Amoxicillin-Pot Clavulanate] Nausea And Vomiting and Other (See Comments)  ?  Did it involve swelling of the face/tongue/throat, SOB, or low BP? No ?Did it involve sudden or severe rash/hives, skin peeling, or any reaction on the inside of your mouth or nose? No ?Did you need to seek medical attention at a hospital or doctor's office? No ?When did it last happen?      5+  years ?If all above answers are ?NO?, may proceed with cephalosporin use. ?  ? Lexapro [Escitalopram Oxalate] Itching  ?  ? ?Outpatient Medications Prior to Visit  ?Medication Sig Dispense Refill  ? acetaminophen (TYLENOL) 500 MG tablet Take 1,000 mg by mouth every 6 (six) hours as needed for mild pain or headache.    ? albuterol (PROVENTIL) (2.5 MG/3ML) 0.083% nebulizer solution USE 1 VIAL VIA NEBULIZER EVERY 6 HOURS AS NEEDED FOR WHEEZING OR SHORTNESS OF BREATH AND INHALE INTO LUNGS 180 mL 3  ? aspirin EC 81 MG tablet Take 1 tablet (81 mg total) by mouth daily. Swallow whole. (Patient taking differently: Take 81 mg by mouth at bedtime. Swallow whole.) 30 tablet 11  ? azithromycin (ZITHROMAX) 250 MG tablet Take 2 tablets by mouth on the first Swingler then take 1  tablet daily until finished 6 each 0  ? cetirizine (ZYRTEC) 10 MG tablet Take 10 mg by mouth daily.    ? diltiazem (CARDIZEM CD) 180 MG 24 hr capsule Take 1 capsule by mouth daily 90 capsule 0  ? fluticasone (FLONASE) 50 MCG/ACT nasal spray Place 2 sprays into both nostrils daily.    ? fluticasone-salmeterol (ADVAIR DISKUS) 100-50 MCG/ACT AEPB Inhale 1 puff into the lungs 2 times a Gupta 60 each 0  ? glucose blood (CONTOUR NEXT TEST) test strip USE 8 TIMES DAILY AS DIRECTED TO MONITOR BLOOD GLUCOSE 600 strip 3  ? HYDROcodone-acetaminophen (NORCO/VICODIN) 5-325 MG tablet Take 1 tablet by mouth every 4-6 hours as needed for pain 14 tablet 0  ? ibuprofen (ADVIL) 200 MG tablet Take 800 mg by mouth every 8 (eight) hours as needed (pain.).    ? influenza vaccine adjuvanted (FLUAD) 0.5 ML injection Inject into the muscle. 0.5 mL 0  ? insulin glargine-yfgn (SEMGLEE, YFGN,) 100 UNIT/ML Pen Inject 38 units subcutaneously daily as directed 45 mL 3  ? insulin lispro (HUMALOG KWIKPEN) 100 UNIT/ML KwikPen INJECT UP TO 100 UNITS A [place] UNDER THE SKIN AS DIRECTED 45 mL 5  ? Insulin Pen Needle 31G X 8 MM MISC Use 1 pen needle subcutaneously 4 - 5 times daily as directed. 500 each  3  ? ipratropium-albuterol (DUONEB) 0.5-2.5 (3) MG/3ML SOLN USE 1 VIAL IN NEBULIZER AS DIRECTED TWICE A [place] AND AS NEEDED 360 mL 3  ? levothyroxine (SYNTHROID) 100 MCG tablet TAKE 1 TABLET BY MOUTH ONCE DAILY 90 t

## 2021-11-02 NOTE — Addendum Note (Signed)
Addended by: Fran Lowes on: 11/02/2021 09:50 AM ? ? Modules accepted: Orders ? ?

## 2021-11-02 NOTE — Patient Instructions (Signed)
Thank you for visiting Dr. Valeta Harms at [facility]. ?Today we recommend the following: ? ?Depo-medrol injection today  ? ?Meds ordered this encounter  ?Medications  ? predniSONE (DELTASONE) 10 MG tablet  ?  Sig: Take 4 tabs by mouth once daily x4 days, then 3 tabs x4 days, 2 tabs x4 days, 1 tab x4 days and stop.  ?  Dispense:  40 tablet  ?  Refill:  0  ? ?Call us if anything changes  ? ?Return in about 8 weeks (around 12/28/2021) for with APP or Dr. Elsworth Soho . ? ? ? ?Please do your part to reduce the spread of COVID-19.  ? ?

## 2021-11-02 NOTE — Telephone Encounter (Signed)
Spoke with the pt  ?He is having worsening SOB and cough despite zpack  ?OV with BI at 9:15 am today ?

## 2021-11-03 ENCOUNTER — Ambulatory Visit: Payer: 59 | Admitting: Adult Health

## 2021-11-05 ENCOUNTER — Ambulatory Visit: Payer: 59 | Admitting: Nurse Practitioner

## 2021-11-05 ENCOUNTER — Telehealth: Payer: Self-pay | Admitting: Pulmonary Disease

## 2021-11-05 ENCOUNTER — Ambulatory Visit (INDEPENDENT_AMBULATORY_CARE_PROVIDER_SITE_OTHER): Payer: 59

## 2021-11-05 ENCOUNTER — Encounter: Payer: Self-pay | Admitting: Nurse Practitioner

## 2021-11-05 ENCOUNTER — Other Ambulatory Visit (HOSPITAL_COMMUNITY): Payer: Self-pay

## 2021-11-05 VITALS — BP 124/68 | HR 90 | Temp 98.1°F | Ht 69.0 in | Wt 179.6 lb

## 2021-11-05 DIAGNOSIS — R058 Other specified cough: Secondary | ICD-10-CM

## 2021-11-05 DIAGNOSIS — J4541 Moderate persistent asthma with (acute) exacerbation: Secondary | ICD-10-CM

## 2021-11-05 DIAGNOSIS — J9811 Atelectasis: Secondary | ICD-10-CM | POA: Diagnosis not present

## 2021-11-05 DIAGNOSIS — J302 Other seasonal allergic rhinitis: Secondary | ICD-10-CM

## 2021-11-05 MED ORDER — PROMETHAZINE-CODEINE 6.25-10 MG/5ML PO SYRP
5.0000 mL | ORAL_SOLUTION | Freq: Three times a day (TID) | ORAL | 0 refills | Status: DC | PRN
  Filled 2021-11-05: qty 80, 6d supply, fill #0

## 2021-11-05 MED ORDER — BREZTRI AEROSPHERE 160-9-4.8 MCG/ACT IN AERO: 2.0000 | INHALATION_SPRAY | Freq: Two times a day (BID) | RESPIRATORY_TRACT | 0 refills | Status: DC

## 2021-11-05 MED ORDER — AZELASTINE HCL 0.1 % NA SOLN
2.0000 | Freq: Two times a day (BID) | NASAL | 3 refills | Status: DC
  Filled 2021-11-05: qty 30, 50d supply, fill #0

## 2021-11-05 MED ORDER — ADVAIR HFA 230-21 MCG/ACT IN AERO
2.0000 | INHALATION_SPRAY | Freq: Two times a day (BID) | RESPIRATORY_TRACT | 5 refills | Status: DC
  Filled 2021-11-05: qty 12, fill #0

## 2021-11-05 MED ORDER — BENZONATATE 200 MG PO CAPS
200.0000 mg | ORAL_CAPSULE | Freq: Three times a day (TID) | ORAL | 1 refills | Status: DC | PRN
  Filled 2021-11-05: qty 30, 10d supply, fill #0

## 2021-11-05 MED ORDER — BREZTRI AEROSPHERE 160-9-4.8 MCG/ACT IN AERO
2.0000 | INHALATION_SPRAY | Freq: Two times a day (BID) | RESPIRATORY_TRACT | 5 refills | Status: DC
  Filled 2021-11-05: qty 10.7, 30d supply, fill #0

## 2021-11-05 NOTE — Addendum Note (Signed)
Addended by: Konrad Felix L on: 11/05/2021 05:03 PM ? ? Modules accepted: Orders ? ?

## 2021-11-05 NOTE — Progress Notes (Signed)
? ?'@Patient'$  ID: James [name], male    DOB: [birth year], 66 y.o.   MRN: [id number] ? ?Chief Complaint  ?Patient presents with  ? Follow-up  ? ? ?Referring provider: ?[name], MD ? ?HPI: ?66 year old male, former smoker followed for moderate persistent asthma.  He is a patient of Dr. Daine Gip and last seen in office 11/02/2021 by Dr. Valeta Harms.  He is also followed by lung cancer screening program.  Past medical history significant for anxiety, CAD, depression, DM type I, HLD, PVD, allergic rhinitis, GERD, BPH. ? ?TEST/EVENTS:  ?09/18/2018 PFTs: FVC 74, FEV1 75, ratio 76; no significant BD ?07/15/2021 CT chest lung cancer screening: Atherosclerosis is present.  There is mild centrilobular emphysema with diffuse bronchial wall thickening.  Lung RADS 2.  No acute process. ? ?11/02/2021: OV with Dr. Valeta Harms.  Asthma flare that started last week after he mowed the grass without wearing a mask.  Developed URI symptoms with nasal congestion posterior nasal drip and sore throat.  Then moved into his chest with increased shortness of breath, productive cough, chest tightness and wheezing.  Previously started on Z-Pak.  Completed this but has not felt any improvement.  Using nebulizer 3 times a Guid[place].  Depo injection administered.  Started on prednisone taper.  Order for doxycycline sent advised to hold off unless he develops fevers or purulent sputum production. ? ?11/05/2021: Today - acute visit ?Patient presents today for persistent cough.  He was seen in the office earlier this week and treated for suspected asthma flare with Depo injection and extended prednisone taper.  He had previously finished Z-Pak and was prescribed doxycycline course to begin taking if he developed a purulent cough or fever.  Today, he reports not feeling any better; yesterday was the worst [place].  His cough is paroxysmal and is productive with yellow to green sputum.  He also has increased shortness of breath with exertion and with coughing spells.  Does  notice some wheezing.  Nasal symptoms have mostly resolved.  Does feel like he may have some increased postnasal drip though.  Also notes increased chest tightness with deep breaths.  He denies any fevers, chills, hemoptysis, calf pain.  He was checking his oxygen yesterday and reports that it was in the high 80s.  Today, he is 94% on room air.  Continues on Advair, Zyrtec and Flonase.  He takes Protonix feels like his GERD is well controlled.  He has been taking Delsym regularly with minimal improvement in his cough. ? ?FeNO 15 ppb ? ?Allergies  ?Allergen Reactions  ? Augmentin [Amoxicillin-Pot Clavulanate] Nausea And Vomiting and Other (See Comments)  ?  Did it involve swelling of the face/tongue/throat, SOB, or low BP? No ?Did it involve sudden or severe rash/hives, skin peeling, or any reaction on the inside of your mouth or nose? No ?Did you need to seek medical attention at a hospital or doctor's office? No ?When did it last happen?      5+ years ?If all above answers are ?NO?, may proceed with cephalosporin use. ?  ? Lexapro [Escitalopram Oxalate] Itching  ? ? ?Immunization History  ?Administered Date(s) Administered  ? Influenza Split 04/19/2011, 05/22/2012, 04/17/2013, 04/18/2017, 03/28/2019  ? Influenza Whole 07/31/2007, 04/15/2009, 05/08/2010  ? Influenza, Quadrivalent, Recombinant, Inj, Pf 03/22/2018, 04/28/2020, 03/25/2021  ? Influenza,inj,Quad PF,6+ Mos 03/19/2015, 05/19/2016  ? Influenza-Unspecified 03/19/2015, 04/12/2017  ? PFIZER(Purple Top)SARS-COV-2 Vaccination 09/21/2019, 10/12/2019, 04/13/2020, 10/24/2020, 03/25/2021  ? Pension scheme manager 70yr & up 04/09/2021  ? Pneumococcal Polysaccharide-23  04/19/2003, 09/24/2011, 04/11/2019  ? Td 12/22/2000  ? Tdap 09/24/2011, 08/19/2013  ? Zoster Recombinat (Shingrix) 09/27/2019, 12/20/2019  ? Zoster, Live 09/27/2019, 12/20/2019  ? ? ?Past Medical History:  ?Diagnosis Date  ? ANXIETY 04/03/2007  ? Anxiety   ? ASTHMA 09/06/2008  ? Asthma    ? ASTHMATIC BRONCHITIS, ACUTE 10/25/2008  ? Bladder neck obstruction   ? CARPAL TUNNEL SYNDROME, BILATERAL 07/31/2007  ? issues resolved, no surgery  ? Cervical disc disease   ? Chronic bronchitis ([place])   ? "get it about q yr" (02/12/2014)  ? COPD (chronic obstructive pulmonary disease) ([place])   ? CORONARY ARTERY DISEASE 04/03/2007  ? DEPRESSION 04/03/2007  ? Depression   ? DIABETES MELLITUS, TYPE I 04/03/2007  ? Diabetic retinopathy associated with diabetes mellitus due to underlying condition ([place]) 04/03/2007  ? DM W/EYE MANIFESTATIONS, TYPE I, UNCONTROLLED 04/04/2007  ? DM W/RENAL MNFST, TYPE I, UNCONTROLLED 04/04/2007  ? ED (erectile dysfunction)   ? History of kidney stones   ? HYPERLIPIDEMIA 04/04/2007  ? Pneumonia   ? "several times and again today" (02/13/2104)  ? Renal insufficiency   ? Seizures ([place])   ? "insulin seizure from time to time; none in the last couple years" (02/12/2014)  ? Spinal stenosis   ? ? ?Tobacco History: ?Social History  ? ?Tobacco Use  ?Smoking Status Former  ? Packs/[place]: 0.00  ? Types: Cigarettes  ? Quit date: 08/20/2019  ? Years since quitting: 2.2  ?Smokeless Tobacco Never  ?Tobacco Comments  ? Successfully quit smoking 08/20/2019  ? ?Counseling given: Not Answered ?Tobacco comments: Successfully quit smoking 08/20/2019 ? ? ?Outpatient Medications Prior to Visit  ?Medication Sig Dispense Refill  ? acetaminophen (TYLENOL) 500 MG tablet Take 1,000 mg by mouth every 6 (six) hours as needed for mild pain or headache.    ? albuterol (PROVENTIL) (2.5 MG/3ML) 0.083% nebulizer solution USE 1 VIAL VIA NEBULIZER EVERY 6 HOURS AS NEEDED FOR WHEEZING OR SHORTNESS OF BREATH AND INHALE INTO LUNGS 180 mL 3  ? aspirin EC 81 MG tablet Take 1 tablet (81 mg total) by mouth daily. Swallow whole. (Patient taking differently: Take 81 mg by mouth at bedtime. Swallow whole.) 30 tablet 11  ? azithromycin (ZITHROMAX) 250 MG tablet Take 2 tablets by mouth on the first [place] then take 1 tablet daily until finished 6 each 0  ?  cetirizine (ZYRTEC) 10 MG tablet Take 10 mg by mouth daily.    ? diltiazem (CARDIZEM CD) 180 MG 24 hr capsule Take 1 capsule by mouth daily 90 capsule 0  ? doxycycline (VIBRA-TABS) 100 MG tablet Take 1 tablet (100 mg total) by mouth 2 (two) times daily. 14 tablet 0  ? fluticasone (FLONASE) 50 MCG/ACT nasal spray Place 2 sprays into both nostrils daily.    ? glucose blood (CONTOUR NEXT TEST) test strip USE 8 TIMES DAILY AS DIRECTED TO MONITOR BLOOD GLUCOSE 600 strip 3  ? ibuprofen (ADVIL) 200 MG tablet Take 800 mg by mouth every 8 (eight) hours as needed (pain.).    ? influenza vaccine adjuvanted (FLUAD) 0.5 ML injection Inject into the muscle. 0.5 mL 0  ? insulin glargine-yfgn (SEMGLEE, YFGN,) 100 UNIT/ML Pen Inject 38 units subcutaneously daily as directed 45 mL 3  ? insulin lispro (HUMALOG KWIKPEN) 100 UNIT/ML KwikPen INJECT UP TO 100 UNITS A Dobler UNDER THE SKIN AS DIRECTED 45 mL 5  ? Insulin Pen Needle 31G X 8 MM MISC Use 1 pen needle subcutaneously 4 - 5 times daily  as directed. 500 each 3  ? levothyroxine (SYNTHROID) 100 MCG tablet TAKE 1 TABLET BY MOUTH ONCE DAILY 90 tablet 3  ? metoCLOPramide (REGLAN) 5 MG tablet TAKE 1 TABLET BY MOUTH TWICE DAILY BEFORE A MEAL. 180 tablet 3  ? MINOXIDIL EX Apply 1 application topically daily. For hair loss    ? NON FORMULARY Take 100 mg by mouth daily. hyaluronic acid    ? pantoprazole (PROTONIX) 40 MG tablet Take 1 tablet by mouth daily for 90 days. 90 tablet 3  ? predniSONE (DELTASONE) 10 MG tablet Take 4 tabs by mouth once daily x4 days, then 3 tabs x4 days, 2 tabs x4 days, 1 tab x4 days and stop. 40 tablet 0  ? simvastatin (ZOCOR) 40 MG tablet TAKE 1 TABLET BY MOUTH ONCE DAILY 90 tablet 3  ? sodium chloride (OCEAN) 0.65 % SOLN nasal spray Place 1 spray into both nostrils as needed for congestion.    ? sodium chloride HYPERTONIC 3 % nebulizer solution Take by nebulization 2 (two) times daily. 240 mL 3  ? tadalafil (CIALIS) 10 MG tablet Take 1 tablet by mouth as needed 10  tablet 3  ? tamsulosin (FLOMAX) 0.4 MG CAPS capsule Take 1 capsule by mouth daily 90 capsule 3  ? traZODone (DESYREL) 150 MG tablet TAKE 1 TABLET BY MOUTH AT BEDTIME AS NEEDED FOR SLEEP 90 tablet 1  ? vareni

## 2021-11-05 NOTE — Assessment & Plan Note (Addendum)
No improvement since seen last.  Persistent bronchitis type symptoms. FeNO nl today. Would not increase prednisone or administer another depo inj at this point given his DM and his normal FeNO. Evaluate CXR to rule out superimposed infection. Advise he start doxy course as previously prescribed given his productive cough. Target postnasal drainage and cough control for upper airway irritation as well. Walking oximetry today without desaturation, lowest SpO2 98%. Advised to monitor at home for SpO2 >88-90% and seek emergency care if below this. Step up to triple therapy. Continue nebs Three times a Hakimian and use flutter valve afterwards. ED precautions ? ?Patient Instructions  ?Stop Advair. Start Breztri 2 puffs Twice daily. Brush tongue and rinse mouth afterwards ?Continue Albuterol inhaler 2 puffs or duoneb 3 mL neb every 6 hours as needed for shortness of breath or wheezing. Notify if symptoms persist despite rescue inhaler/neb use. ?Continue Zyrtec 10 mg daily ?Continue flonase nasal spray 2 sprays each nostril ?Continue protonix 40 mg daily ?Continue ocean nasal spray 2-3 times a [place] as needed for nasal congestion/drainage  ? ?Start doxycycline course as previously prescribed. 1 tab Twice daily for 7 days ?Tessalon perles (benzonatate) 1 capsule Three times a [place] as needed for cough ?Phenergan with codeine 5 mL every 8 hours as needed for cough ?Astelin 2 sprays each nostril Twice daily for nasal congestion ?Continue prednisone taper as previously prescribed  ?Continue mucinex 1200 mg Twice daily  ? ?Chest x ray today. We will notify you of any abnormal results. ? ?Follow up in 2 weeks with Dr. Elsworth Soho or Alanson Aly. If symptoms do not improve or worsen, please contact office for sooner follow up or seek emergency care.  ? ? ?

## 2021-11-05 NOTE — Telephone Encounter (Signed)
Called and spoke with pt who states he just finished taking the '40mg'$  prednisone and will begin on '30mg'$  tomorrow. Pt is wanting to know if his pred taper might be able to be changed to where he is on a higher dose as when he was on the '40mg'$ , he could not tell that it was helping any with his symptoms. ? ?Pt is still coughing a lot and sounding junky when he coughs. States that he is also wheezing some. Pt has had some drops in O2 sats with lowest sat being 89%. Due to all the coughing, pt has had to do some breathing treatments which has helped. Pt said when he coughs, he does get lightheaded at times. ? ? ?Along with seeing if his pred taper instructions being changed to him being on a higher dose, pt wants to know if he could possibly get another steroid shot if provider thought that he may benefit from it. ? ?Dr. Elsworth Soho, please advise on this. ?

## 2021-11-05 NOTE — Assessment & Plan Note (Signed)
Continue Zyrtec for trigger prevention and flonase. Add astelin for better postnasal drainage control. We will check IgE and CBC with diff for further assessment once off prednisone.  ?

## 2021-11-05 NOTE — Patient Instructions (Addendum)
Stop Advair. Start Breztri 2 puffs Twice daily. Brush tongue and rinse mouth afterwards ?Continue Albuterol inhaler 2 puffs or duoneb 3 mL neb every 6 hours as needed for shortness of breath or wheezing. Notify if symptoms persist despite rescue inhaler/neb use. ?Continue Zyrtec 10 mg daily ?Continue flonase nasal spray 2 sprays each nostril ?Continue protonix 40 mg daily ?Continue ocean nasal spray 2-3 times a [place] as needed for nasal congestion/drainage  ? ?Start doxycycline course as previously prescribed. 1 tab Twice daily for 7 days ?Tessalon perles (benzonatate) 1 capsule Three times a [place] as needed for cough ?Phenergan with codeine 5 mL every 8 hours as needed for cough ?Astelin 2 sprays each nostril Twice daily for nasal congestion ?Continue prednisone taper as previously prescribed  ?Continue mucinex 1200 mg Twice daily  ? ?Chest x ray today. We will notify you of any abnormal results. ? ?Follow up in 2 weeks with Dr. Elsworth Soho or Alanson Aly. If symptoms do not improve or worsen, please contact office for sooner follow up or seek emergency care.  ?

## 2021-11-05 NOTE — Telephone Encounter (Signed)
Called and spoke with pt letting him know that Dr. Elsworth Soho said that he needs OV to further assess and he verbalized understanding. Appt scheduled for pt with [place] today 4/20 at 2:30. Nothing further needed. ?

## 2021-11-06 ENCOUNTER — Telehealth: Payer: Self-pay | Admitting: Nurse Practitioner

## 2021-11-06 ENCOUNTER — Other Ambulatory Visit (HOSPITAL_COMMUNITY): Payer: Self-pay

## 2021-11-06 NOTE — Telephone Encounter (Signed)
Attempted to notify patient that there is a linear opacity in his left mid/lower lung; concerned possible pneumonia or could be atelectasis. Advise him to take his doxycycline as discussed yesterday, which would cover for pneumonia. Please notify us if no improvement in his breathing or seek emergency care if he worsens over the weekend. Thanks ?

## 2021-11-12 DIAGNOSIS — E103593 Type 1 diabetes mellitus with proliferative diabetic retinopathy without macular edema, bilateral: Secondary | ICD-10-CM | POA: Diagnosis not present

## 2021-11-12 DIAGNOSIS — Z125 Encounter for screening for malignant neoplasm of prostate: Secondary | ICD-10-CM | POA: Diagnosis not present

## 2021-11-12 DIAGNOSIS — I2510 Atherosclerotic heart disease of native coronary artery without angina pectoris: Secondary | ICD-10-CM | POA: Diagnosis not present

## 2021-11-12 DIAGNOSIS — E785 Hyperlipidemia, unspecified: Secondary | ICD-10-CM | POA: Diagnosis not present

## 2021-11-16 ENCOUNTER — Other Ambulatory Visit (HOSPITAL_COMMUNITY): Payer: Self-pay

## 2021-11-19 DIAGNOSIS — I2510 Atherosclerotic heart disease of native coronary artery without angina pectoris: Secondary | ICD-10-CM | POA: Diagnosis not present

## 2021-11-19 DIAGNOSIS — Z1331 Encounter for screening for depression: Secondary | ICD-10-CM | POA: Diagnosis not present

## 2021-11-19 DIAGNOSIS — J441 Chronic obstructive pulmonary disease with (acute) exacerbation: Secondary | ICD-10-CM | POA: Diagnosis not present

## 2021-11-19 DIAGNOSIS — I739 Peripheral vascular disease, unspecified: Secondary | ICD-10-CM | POA: Diagnosis not present

## 2021-11-19 DIAGNOSIS — E103593 Type 1 diabetes mellitus with proliferative diabetic retinopathy without macular edema, bilateral: Secondary | ICD-10-CM | POA: Diagnosis not present

## 2021-11-19 DIAGNOSIS — Z0000 Encounter for general adult medical examination without abnormal findings: Secondary | ICD-10-CM | POA: Diagnosis not present

## 2021-11-19 DIAGNOSIS — N200 Calculus of kidney: Secondary | ICD-10-CM | POA: Diagnosis not present

## 2021-11-19 DIAGNOSIS — Z794 Long term (current) use of insulin: Secondary | ICD-10-CM | POA: Diagnosis not present

## 2021-11-19 DIAGNOSIS — Z1339 Encounter for screening examination for other mental health and behavioral disorders: Secondary | ICD-10-CM | POA: Diagnosis not present

## 2021-11-19 DIAGNOSIS — I6523 Occlusion and stenosis of bilateral carotid arteries: Secondary | ICD-10-CM | POA: Diagnosis not present

## 2021-11-19 DIAGNOSIS — I700 Atherosclerosis of aorta: Secondary | ICD-10-CM | POA: Diagnosis not present

## 2021-11-20 ENCOUNTER — Other Ambulatory Visit (HOSPITAL_COMMUNITY): Payer: Self-pay

## 2021-11-20 MED ORDER — TAMSULOSIN HCL 0.4 MG PO CAPS
0.4000 mg | ORAL_CAPSULE | Freq: Every day | ORAL | 3 refills | Status: DC
  Filled 2021-11-20: qty 90, 90d supply, fill #0

## 2021-11-30 ENCOUNTER — Other Ambulatory Visit (HOSPITAL_COMMUNITY): Payer: Self-pay

## 2021-12-07 ENCOUNTER — Other Ambulatory Visit (HOSPITAL_COMMUNITY): Payer: Self-pay

## 2021-12-07 ENCOUNTER — Telehealth: Payer: Self-pay | Admitting: Pulmonary Disease

## 2021-12-07 MED ORDER — PANTOPRAZOLE SODIUM 40 MG PO TBEC
DELAYED_RELEASE_TABLET | ORAL | 12 refills | Status: DC
  Filled 2021-12-07: qty 90, 90d supply, fill #0
  Filled 2022-03-19: qty 90, 90d supply, fill #1
  Filled 2022-09-12: qty 90, 90d supply, fill #2

## 2021-12-07 NOTE — Telephone Encounter (Signed)
Called patient and got him set up for a visit with   Roxan Diesel Thurday  At 10am   Chest xray before visit.   Nothing further needed

## 2021-12-07 NOTE — Telephone Encounter (Signed)
SOB noted for the last few days. Oxygen was noted at 87% this morning at 5am. Patient is feeling fatigue. Patient feels like his health is getting worse. No fever noted. Cough is noted but he is not able to produce any mucus. Patient states he is not on any oxygen at the moment.   Please advise Dr Elsworth Soho.

## 2021-12-08 ENCOUNTER — Other Ambulatory Visit (HOSPITAL_COMMUNITY): Payer: Self-pay

## 2021-12-10 ENCOUNTER — Ambulatory Visit: Payer: 59 | Admitting: Nurse Practitioner

## 2021-12-15 ENCOUNTER — Other Ambulatory Visit (HOSPITAL_COMMUNITY): Payer: Self-pay

## 2021-12-28 ENCOUNTER — Other Ambulatory Visit (HOSPITAL_BASED_OUTPATIENT_CLINIC_OR_DEPARTMENT_OTHER): Payer: Self-pay

## 2021-12-28 ENCOUNTER — Ambulatory Visit: Payer: 59 | Attending: Internal Medicine

## 2021-12-28 DIAGNOSIS — Z23 Encounter for immunization: Secondary | ICD-10-CM

## 2021-12-28 MED ORDER — PFIZER COVID-19 VAC BIVALENT 30 MCG/0.3ML IM SUSP
INTRAMUSCULAR | 0 refills | Status: DC
Start: 2021-12-28 — End: 2022-01-14
  Filled 2021-12-28: qty 0.3, 1d supply, fill #0

## 2022-01-01 ENCOUNTER — Other Ambulatory Visit (HOSPITAL_BASED_OUTPATIENT_CLINIC_OR_DEPARTMENT_OTHER): Payer: Self-pay

## 2022-01-01 NOTE — Progress Notes (Signed)
   Covid-19 Vaccination Clinic  Name:  James [name]    MRN: [id number] DOB: [birth year]  01/01/2022  [name] was observed post Covid-19 immunization for 15 minutes without incident. He was provided with Vaccine Information Sheet and instruction to access the V-Safe system.   [name] was instructed to call 911 with any severe reactions post vaccine: Difficulty breathing  Swelling of face and throat  A fast heartbeat  A bad rash all over body  Dizziness and weakness   Immunizations Administered     Name Date Dose VIS Date Route   Pfizer Covid-19 Vaccine Bivalent Booster 12/28/2021  2:00 PM 0.3 mL 03/18/2021 Intramuscular   Manufacturer: [place]   Lot: [id number]   [place]: [phone]

## 2022-01-13 ENCOUNTER — Encounter: Payer: Self-pay | Admitting: Cardiology

## 2022-01-14 ENCOUNTER — Ambulatory Visit: Payer: 59 | Admitting: Cardiology

## 2022-01-14 ENCOUNTER — Encounter: Payer: Self-pay | Admitting: Cardiology

## 2022-01-14 VITALS — BP 108/69 | HR 75 | Temp 98.0°F | Resp 16 | Ht 69.0 in | Wt 179.2 lb

## 2022-01-14 DIAGNOSIS — E1059 Type 1 diabetes mellitus with other circulatory complications: Secondary | ICD-10-CM | POA: Diagnosis not present

## 2022-01-14 DIAGNOSIS — R0609 Other forms of dyspnea: Secondary | ICD-10-CM | POA: Diagnosis not present

## 2022-01-14 DIAGNOSIS — E782 Mixed hyperlipidemia: Secondary | ICD-10-CM | POA: Diagnosis not present

## 2022-01-14 DIAGNOSIS — I2510 Atherosclerotic heart disease of native coronary artery without angina pectoris: Secondary | ICD-10-CM

## 2022-01-14 DIAGNOSIS — Z794 Long term (current) use of insulin: Secondary | ICD-10-CM

## 2022-01-14 DIAGNOSIS — R42 Dizziness and giddiness: Secondary | ICD-10-CM

## 2022-01-14 DIAGNOSIS — I2584 Coronary atherosclerosis due to calcified coronary lesion: Secondary | ICD-10-CM | POA: Diagnosis not present

## 2022-01-14 NOTE — Progress Notes (Signed)
Date:  07/31/2021   ID:  Joesph Fillers [place], DOB 10-Aug-1955, MRN [id number]  PCP:  [name], MD  Cardiologist:  Rex Kras, DO, [facility] (established care 04/16/2020)  Date: 01/14/22 Last Office Visit: 07/31/2021  Chief Complaint  Patient presents with   Shortness of Breath   Dizziness   Follow-up    HPI  James [name] is a 66 y.o. male whose past medical history and cardiovascular risk factors include: Insulin-dependent diabetes mellitus type 1, hypothyroidism, former smoker (40-year pack history of smoking), hyperlipidemia,  coronary artery calcification, aortic atherosclerosis.   He was referred to the practice for evaluation of coronary artery calcification and atherosclerosis of the aorta which was noted incidentally on a CTA chest in May 2021.  Given his cardiovascular risk factors he underwent a left heart catheterization which noted microvascular dysfunction without any significant epicardial coronary artery disease and amenable to intervention.  He was educated on the importance of improving his modifiable cardiovascular risk factors.  He presents today with multiple concerns including a dizziness, feeling tired/fatigued, decreased physical endurance over the last 2 months, and pain in the forearm.  Patient states that 2 months ago he had some URI symptoms and was treated for asthma and since then he has not gone to the gym due to feeling tired and fatigue.  At times he feels lightheaded/dizziness, occurs intermittently, not associated with changing his positions or turning his head side to side.  No associated intractable nausea vomiting or focal neurological deficits.  No history of exertional syncope.  Prior echocardiograms do not illustrate findings to suggest pulmonary hypertension.  He denies anginal discomfort.  Due to chronic orthopnea he has been sleeping in a recliner for years and has started noticing pain/tingling in his left forearm.  He denies near-syncope or syncopal events.    At times patient states that his pulse ox was 80%.  He does not use oxygen at home.  No recent prolonged immobilization or history of DVT or PE.  ALLERGIES: Allergies  Allergen Reactions   Augmentin [Amoxicillin-Pot Clavulanate] Nausea And Vomiting and Other (See Comments)    Did it involve swelling of the face/tongue/throat, SOB, or low BP? No Did it involve sudden or severe rash/hives, skin peeling, or any reaction on the inside of your mouth or nose? No Did you need to seek medical attention at a hospital or doctor's office? No When did it last happen?      5+ years If all above answers are "NO", may proceed with cephalosporin use.    Lexapro [Escitalopram Oxalate] Itching    MEDICATION LIST PRIOR TO VISIT: Current Meds  Medication Sig   acetaminophen (TYLENOL) 500 MG tablet Take 1,000 mg by mouth every 6 (six) hours as needed for mild pain or headache.   albuterol (PROVENTIL) (2.5 MG/3ML) 0.083% nebulizer solution USE 1 VIAL VIA NEBULIZER EVERY 6 HOURS AS NEEDED FOR WHEEZING OR SHORTNESS OF BREATH AND INHALE INTO LUNGS   albuterol (VENTOLIN HFA) 108 (90 Base) MCG/ACT inhaler INHALE 2 PUFFS BY MOUTH INTO THE LUNGS EVERY 6 HOURS AS NEEDED FOR WHEEZING OR SHORTNESS OF BREATH.   aspirin EC 81 MG tablet Take 1 tablet (81 mg total) by mouth daily. Swallow whole. (Patient taking differently: Take 81 mg by mouth at bedtime. Swallow whole.)   azelastine (ASTELIN) 0.1 % nasal spray Place 2 sprays into both nostrils 2 (two) times daily   Budeson-Glycopyrrol-Formoterol (BREZTRI AEROSPHERE) 160-9-4.8 MCG/ACT AERO Inhale 2 puffs into the lungs in the morning and  at bedtime.   cetirizine (ZYRTEC) 10 MG tablet Take 10 mg by mouth daily.   fluticasone (FLONASE) 50 MCG/ACT nasal spray Place 2 sprays into both nostrils daily.   Glucagon, rDNA, (GLUCAGON EMERGENCY) 1 MG KIT USE AS NEEDED FOR SEVERE HYPOGLYCEMIA   glucose blood (CONTOUR NEXT TEST) test strip USE 8 TIMES DAILY AS DIRECTED TO MONITOR  BLOOD GLUCOSE   ibuprofen (ADVIL) 200 MG tablet Take 800 mg by mouth every 8 (eight) hours as needed (pain.).   insulin glargine-yfgn (SEMGLEE, YFGN,) 100 UNIT/ML Pen Inject 38 units subcutaneously daily as directed   insulin lispro (HUMALOG KWIKPEN) 100 UNIT/ML KwikPen INJECT UP TO 100 UNITS A Circle UNDER THE SKIN AS DIRECTED   Insulin Pen Needle 31G X 8 MM MISC Use 1 pen needle subcutaneously 4 - 5 times daily as directed.   levothyroxine (SYNTHROID) 100 MCG tablet TAKE 1 TABLET BY MOUTH ONCE DAILY   metoCLOPramide (REGLAN) 5 MG tablet TAKE 1 TABLET BY MOUTH TWICE DAILY BEFORE A MEAL.   pantoprazole (PROTONIX) 40 MG tablet Take 1 tablet by mouth daily   simvastatin (ZOCOR) 40 MG tablet TAKE 1 TABLET BY MOUTH ONCE DAILY   traZODone (DESYREL) 150 MG tablet TAKE 1 TABLET BY MOUTH AT BEDTIME AS NEEDED FOR SLEEP   [DISCONTINUED] diltiazem (CARDIZEM CD) 180 MG 24 hr capsule Take 1 capsule by mouth daily     PAST MEDICAL HISTORY: Past Medical History:  Diagnosis Date   ANXIETY 04/03/2007   Anxiety    ASTHMA 09/06/2008   Asthma    ASTHMATIC BRONCHITIS, ACUTE 10/25/2008   Bladder neck obstruction    CARPAL TUNNEL SYNDROME, BILATERAL 07/31/2007   issues resolved, no surgery   Cervical disc disease    Chronic bronchitis ([place])    "get it about q yr" (02/12/2014)   COPD (chronic obstructive pulmonary disease) ([place])    CORONARY ARTERY DISEASE 04/03/2007   DEPRESSION 04/03/2007   Depression    DIABETES MELLITUS, TYPE I 04/03/2007   Diabetic retinopathy associated with diabetes mellitus due to underlying condition ([place]) 04/03/2007   DM W/EYE MANIFESTATIONS, TYPE I, UNCONTROLLED 04/04/2007   DM W/RENAL MNFST, TYPE I, UNCONTROLLED 04/04/2007   ED (erectile dysfunction)    History of kidney stones    HYPERLIPIDEMIA 04/04/2007   Pneumonia    "several times and again today" (02/13/2104)   Renal insufficiency    Seizures ([place])    "insulin seizure from time to time; none in the last couple years" (02/12/2014)    Spinal stenosis     PAST SURGICAL HISTORY: Past Surgical History:  Procedure Laterality Date   ANTERIOR CERVICAL DECOMP/DISCECTOMY FUSION  2000   "couple screws and a plate"   ANTERIOR CERVICAL DECOMP/DISCECTOMY FUSION N/A 06/24/2016   Procedure: ANTERIOR CERVICAL DECOMPRESSION/DISCECTOMY FUSION CERVICAL FOUR - CERVICAL FIVE, CERVICAL FIVE - CERVICAL SIX; REMOVAL TETHER CERVICAL PLATE;  Surgeon: Jovita Gamma, MD;  Location: [facility];  Service: Neurosurgery;  Laterality: N/A;  ANTERIOR CERVICAL DECOMPRESSION/DISCECTOMY FUSION CERVICAL FOUR - CERVICAL FIVE, CERVICAL FIVE - CERVICAL SIX; REMOVAL TETHER CERVICAL PLATE   APPENDECTOMY     BACK SURGERY     CARDIAC CATHETERIZATION  1990's   CATARACT EXTRACTION W/ INTRAOCULAR LENS  IMPLANT, BILATERAL Bilateral    CYSTOSCOPY WITH RETROGRADE PYELOGRAM, URETEROSCOPY AND STENT PLACEMENT Bilateral 04/06/2013   Procedure: BILATERAL CYSTOSCOPY WITH RETROGRADE PYELOGRAMS, STENT PLACEMENTS AND LEFT URETEROSCOPY AND STONE REMOVAL;  Surgeon: Alexis Frock, MD;  Location: [facility];  Service: Urology;  Laterality: Bilateral;   CYSTOSCOPY WITH STENT  PLACEMENT Right 04/12/2013   Procedure: CYSTOSCOPY WITH STENT PLACEMENT;  Surgeon: Alexis Frock, MD;  Location: [facility];  Service: Urology;  Laterality: Right;   CYSTOSCOPY/RETROGRADE/URETEROSCOPY Bilateral 04/12/2013   Procedure: CYSTOSCOPY/RETROGRADE/URETEROSCOPY;  Surgeon: Alexis Frock, MD;  Location: [facility];  Service: Urology;  Laterality: Bilateral;  RIGHT RETROGRADE    HOLMIUM LASER APPLICATION Left 11/04/6220   Procedure: HOLMIUM LASER APPLICATION;  Surgeon: Alexis Frock, MD;  Location: [facility];  Service: Urology;  Laterality: Left;   LEFT HEART CATH AND CORONARY ANGIOGRAPHY N/A 04/29/2020   Procedure: LEFT HEART CATH AND CORONARY ANGIOGRAPHY;  Surgeon: Nigel Mormon, MD;  Location: [facility];  Service: Cardiovascular;  Laterality: N/A;   LUMBAR LAMINECTOMY/DECOMPRESSION MICRODISCECTOMY N/A  04/20/2019   Procedure: Lumbar microdisectomy and decompression L5-S1 left;  Surgeon: Latanya Maudlin, MD;  Location: [facility];  Service: Orthopedics;  Laterality: N/A;  65mn   LYMPH NODE DISSECTION  ~ 1960   groin   stress cardiolite  09/06/2002   TONSILLECTOMY     VITRECTOMY Bilateral     FAMILY HISTORY: The patient family history includes Cancer in his mother; Diabetes in his brother; Heart disease in his brother; Stroke in his father.  SOCIAL HISTORY:  The patient  reports that he quit smoking about 2 years ago. His smoking use included cigarettes. He has never used smokeless tobacco. He reports that he does not drink alcohol and does not use drugs.  REVIEW OF SYSTEMS: Review of Systems  Constitutional: Positive for malaise/fatigue. Negative for chills and fever.  HENT:  Negative for hoarse voice and nosebleeds.   Eyes:  Negative for discharge, double vision and pain.  Cardiovascular:  Positive for dyspnea on exertion and orthopnea. Negative for chest pain, claudication, leg swelling, near-syncope, palpitations, paroxysmal nocturnal dyspnea and syncope.  Respiratory:  Positive for shortness of breath. Negative for hemoptysis.   Musculoskeletal:  Negative for muscle cramps and myalgias.  Gastrointestinal:  Negative for abdominal pain, constipation, diarrhea, hematemesis, hematochezia, melena, nausea and vomiting.  Neurological:  Positive for dizziness, light-headedness and paresthesias (left elbow to wrist.).    PHYSICAL EXAM:    01/14/2022    1:49 PM 11/05/2021    2:28 PM 11/02/2021    9:21 AM  Vitals with BMI  Height '5\' 9"'$  '5\' 9"'$  '5\' 9"'$   Weight 179 lbs 3 oz 179 lbs 10 oz 178 lbs 6 oz  BMI 26.45 [id number] Systolic [id number] Diastolic 69 68 68  Pulse 75 90 71   Orthostatic VS for the past 72 hrs (Last 3 readings):  Orthostatic BP Patient Position BP Location Cuff Size Orthostatic Pulse  01/14/22 1408 127/64 Standing Left Arm Normal 78  01/14/22 1407 110/60 Sitting Left  Arm Normal 68  01/14/22 1406 115/66 Supine Left Arm Normal 68    CONSTITUTIONAL: Well-developed and well-nourished. No acute distress.  SKIN: Skin is warm and dry. No rash noted. No cyanosis. No pallor. No jaundice HEAD: Normocephalic and atraumatic.  EYES: No scleral icterus MOUTH/THROAT: Moist oral membranes.  NECK: No JVD present. No thyromegaly noted. Left carotid bruit.  CHEST Normal respiratory effort. No intercostal retractions  LUNGS: Clear to auscultation bilaterally with the exception of the rhonchi in the left lower lung fields. No stridor. No wheezes. No rales.  CARDIOVASCULAR: Regular rate and rhythm, positive S1-S2, no murmurs rubs or gallops appreciated. ABDOMINAL: Soft, nontender, distended, positive bowel sounds in all 4 quadrants, no apparent ascites.  EXTREMITIES: No peripheral edema, decreased DP and PT bilaterally.  HEMATOLOGIC: No significant bruising NEUROLOGIC: Oriented to person, place, and time. Nonfocal. Normal muscle tone.  PSYCHIATRIC: Normal mood and affect. Normal behavior. Cooperative  CARDIAC DATABASE: EKG: 01/14/2022: Normal sinus rhythm, 65 bpm, normal axis, frequent PVCs, without underlying injury pattern.  Echocardiogram: 04/22/2020: Left ventricle cavity is normal in size. Moderate concentric hypertrophy of the left ventricle. Normal global wall motion. Normal LV systolic function with EF 55%. Doppler evidence of grade I (impaired) diastolic dysfunction, normal LAP. Mild tricuspid regurgitation. No evidence of pulmonary hypertension.   Stress Testing: No results found for this or any previous visit from the past 1095 days.  Heart Catheterization:  04/29/2020: LM: Normal LAD: 75% stenosis in mid small caliber distal LAD. TIMI II flow, only marginally improved after IC NTG 200 mg. LCx: Large dominant vessel with TIMI II flow, only marginally improved after IC NTG 200 mg RCA: Small non-dominant vessel with TIMI II flow Suspect microvascular  dysfunction. Consider addition of calcium channel blocker.   Carotid artery duplex 07/09/2021:  Duplex suggests stenosis in the right internal carotid artery (minimal).  Duplex suggests stenosis in the left internal carotid artery (minimal).  Duplex suggests stenosis in the left external carotid artery (<50%).  Antegrade right vertebral artery flow. Antegrade left vertebral artery flow  Compared to the study 04/27/20, mild left ICA stenosis of 15-49% no longer present, further studies if clinically indicated.  LABORATORY DATA:    Latest Ref Rng & Units 07/31/2021   11:20 AM 04/29/2020    1:50 PM 04/22/2020    8:47 AM  CBC  WBC 3.4 - 10.8 x10E3/uL   4.0   Hemoglobin 13.0 - 17.7 g/dL 14.9  14.3  15.8   Hematocrit 37.5 - 51.0 % 42.3  42.0  46.1   Platelets 150 - 450 x10E3/uL   266        Latest Ref Rng & Units 07/31/2021   11:20 AM 04/29/2020    1:50 PM 04/29/2020    1:32 PM  CMP  Glucose 70 - 99 mg/dL 89  115  114   BUN 8 - 27 mg/dL $Remove'17  16  14   'gHTmoxb$ Creatinine 0.76 - 1.27 mg/dL 1.09  0.80  0.86   Sodium 134 - 144 mmol/L 137  140  138   Potassium 3.5 - 5.2 mmol/L 4.5  4.3  4.5   Chloride 96 - 106 mmol/L 100  104  107   CO2 20 - 29 mmol/L 21   23   Calcium 8.6 - 10.2 mg/dL 9.4   8.8     Lipid Panel     Component Value Date/Time   CHOL 124 05/08/2010 1434   TRIG 38.0 05/08/2010 1434   HDL 54.20 05/08/2010 1434   CHOLHDL 2 05/08/2010 1434   VLDL 7.6 05/08/2010 1434   LDLCALC 62 05/08/2010 1434    No components found for: "NTPROBNP" Recent Labs    07/31/21 1120  PROBNP 42   No results for input(s): "TSH" in the last 8760 hours.  BMP Recent Labs    07/31/21 1120  NA 137  K 4.5  CL 100  CO2 21  GLUCOSE 89  BUN 17  CREATININE 1.09  CALCIUM 9.4     HEMOGLOBIN A1C Lab Results  Component Value Date   HGBA1C 6.5 (H) 10/26/2019   MPG 140 10/26/2019   External Labs: Collected: 01/23/2020 Creatinine 1 mg/dL. eGFR: 75 mL/min per 1.73 m Potassium 4.6 TSH: 4.92  and free T4 110 ng/dL   IMPRESSION:  ICD-10-CM   1. Dyspnea on exertion  R06.09 EKG 12-Lead    PCV ECHOCARDIOGRAM COMPLETE    PCV CAROTID DUPLEX (BILATERAL)    Pro b natriuretic peptide (BNP)    Basic metabolic panel    Magnesium    D-dimer, quantitative    CBC    2. Dizziness  R42     3. Coronary atherosclerosis due to calcified coronary lesion of native artery  I25.10 PCV CAROTID DUPLEX (BILATERAL)   I25.84     4. Type 1 diabetes mellitus with other circulatory complication (HCC)  E31.54 PCV CAROTID DUPLEX (BILATERAL)    5. Long-term insulin use (HCC)  Z79.4     6. Mixed hyperlipidemia  E78.2        RECOMMENDATIONS: James [name] is a 66 y.o. male whose past medical history and cardiac risk factors include: Insulin-dependent diabetes mellitus type 1, hypothyroidism, former smoker (40-year pack history of smoking), hyperlipidemia,  coronary artery calcification, aortic atherosclerosis.   Dyspnea on exertion Claims that the symptoms have become more pronounced when compared to prior. EKG shows a sinus rhythm with PVCs. We will plan an echocardiogram to reevaluate LVEF, valvular heart disease, and RVSP. We will check CBC to evaluate for anemia. Check D-dimer if possible would consider CTA for PE protocol or VQ scan depending on renal function. Check BNP to evaluate for volume status, clinically appears to be euvolemic. Check BMP and magnesium level. Recommend pulmonary evaluation as well.  Dizziness: He was hypoglycemic on arrival w/ blood glucose of 50.  He was supplemented with glucose and his sugars came up to 115 by the end of the appointment. His BP is also soft and therefore we will discontinue diltiazem for now which she was on for microvascular dysfunction. Orthostatic vital signs negative. In the past he had carotid duplex which were negative for any significant stenosis but given his symptoms we will reevaluate. Patient is asked to change positions  slowly. Given his episode of hypoglycemia I have asked him to discuss his current insulin regimen with his provider to see if it is contributing.  Coronary atherosclerosis due to calcified coronary lesion of native artery/microvascular dysfunction: Denies angina pectoris Medications reconciled. Echo will be ordered to evaluate for structural heart disease and left ventricular systolic function. Left heart catheterization: No significant epicardial coronary artery disease with findings consistent of microvascular dysfunction  Mixed hyperlipidemia Continue statin therapy, managed by PCP.  Patient also complains of tingling in the left forearm and hand.  When he has symptoms it usually affects the first 3 phalanges, predominately occurring while he is asleep, suspect that he may have carpal tunnel syndrome.  He is also had a long history of insulin-dependent diabetes and therefore neuropathy cannot be ruled out.  I have asked him to also reach out to pulmonary medicine for reevaluation as well.  I called the patient's wife after the office visit to update her with the plan of care and the recommendations provided.  Total time spent: 42 minutes  FINAL MEDICATION LIST END OF ENCOUNTER: No orders of the defined types were placed in this encounter.    Current Outpatient Medications:    acetaminophen (TYLENOL) 500 MG tablet, Take 1,000 mg by mouth every 6 (six) hours as needed for mild pain or headache., Disp: , Rfl:    albuterol (PROVENTIL) (2.5 MG/3ML) 0.083% nebulizer solution, USE 1 VIAL VIA NEBULIZER EVERY 6 HOURS AS NEEDED FOR WHEEZING OR SHORTNESS OF BREATH AND INHALE INTO LUNGS, Disp: 180 mL, Rfl: 3  albuterol (VENTOLIN HFA) 108 (90 Base) MCG/ACT inhaler, INHALE 2 PUFFS BY MOUTH INTO THE LUNGS EVERY 6 HOURS AS NEEDED FOR WHEEZING OR SHORTNESS OF BREATH., Disp: 18 g, Rfl: 2   aspirin EC 81 MG tablet, Take 1 tablet (81 mg total) by mouth daily. Swallow whole. (Patient taking differently:  Take 81 mg by mouth at bedtime. Swallow whole.), Disp: 30 tablet, Rfl: 11   azelastine (ASTELIN) 0.1 % nasal spray, Place 2 sprays into both nostrils 2 (two) times daily, Disp: 30 mL, Rfl: 3   Budeson-Glycopyrrol-Formoterol (BREZTRI AEROSPHERE) 160-9-4.8 MCG/ACT AERO, Inhale 2 puffs into the lungs in the morning and at bedtime., Disp: 10.7 g, Rfl: 5   cetirizine (ZYRTEC) 10 MG tablet, Take 10 mg by mouth daily., Disp: , Rfl:    fluticasone (FLONASE) 50 MCG/ACT nasal spray, Place 2 sprays into both nostrils daily., Disp: , Rfl:    Glucagon, rDNA, (GLUCAGON EMERGENCY) 1 MG KIT, USE AS NEEDED FOR SEVERE HYPOGLYCEMIA, Disp: 2 kit, Rfl: 12   glucose blood (CONTOUR NEXT TEST) test strip, USE 8 TIMES DAILY AS DIRECTED TO MONITOR BLOOD GLUCOSE, Disp: 600 strip, Rfl: 3   ibuprofen (ADVIL) 200 MG tablet, Take 800 mg by mouth every 8 (eight) hours as needed (pain.)., Disp: , Rfl:    insulin glargine-yfgn (SEMGLEE, YFGN,) 100 UNIT/ML Pen, Inject 38 units subcutaneously daily as directed, Disp: 45 mL, Rfl: 3   insulin lispro (HUMALOG KWIKPEN) 100 UNIT/ML KwikPen, INJECT UP TO 100 UNITS A [place] UNDER THE SKIN AS DIRECTED, Disp: 45 mL, Rfl: 5   Insulin Pen Needle 31G X 8 MM MISC, Use 1 pen needle subcutaneously 4 - 5 times daily as directed., Disp: 500 each, Rfl: 3   levothyroxine (SYNTHROID) 100 MCG tablet, TAKE 1 TABLET BY MOUTH ONCE DAILY, Disp: 90 tablet, Rfl: 3   metoCLOPramide (REGLAN) 5 MG tablet, TAKE 1 TABLET BY MOUTH TWICE DAILY BEFORE A MEAL., Disp: 180 tablet, Rfl: 3   pantoprazole (PROTONIX) 40 MG tablet, Take 1 tablet by mouth daily, Disp: 90 tablet, Rfl: 12   simvastatin (ZOCOR) 40 MG tablet, TAKE 1 TABLET BY MOUTH ONCE DAILY, Disp: 90 tablet, Rfl: 3   traZODone (DESYREL) 150 MG tablet, TAKE 1 TABLET BY MOUTH AT BEDTIME AS NEEDED FOR SLEEP, Disp: 90 tablet, Rfl: 1  Orders Placed This Encounter  Procedures   Pro b natriuretic peptide (BNP)   Basic metabolic panel   Magnesium   D-dimer,  quantitative   CBC   EKG 12-Lead   PCV ECHOCARDIOGRAM COMPLETE   PCV CAROTID DUPLEX (BILATERAL)    There are no Patient Instructions on file for this visit.   --Continue cardiac medications as reconciled in final medication list. --Return in about 4 weeks (around 02/11/2022) for Follow up shortness of breath, Review test results. Or sooner if needed. --Continue follow-up with your primary care physician regarding the management of your other chronic comorbid conditions.  Patient's questions and concerns were addressed to his satisfaction. He voices understanding of the instructions provided during this encounter.   This note was created using a voice recognition software as a result there may be grammatical errors inadvertently enclosed that do not reflect the nature of this encounter. Every attempt is made to correct such errors.  Rex Kras, [place], [facility]  Pager: [phone] Office: [phone]

## 2022-01-14 NOTE — Telephone Encounter (Signed)
From patient.

## 2022-01-23 ENCOUNTER — Other Ambulatory Visit (HOSPITAL_COMMUNITY): Payer: Self-pay

## 2022-01-25 ENCOUNTER — Other Ambulatory Visit (HOSPITAL_COMMUNITY): Payer: Self-pay

## 2022-01-28 ENCOUNTER — Ambulatory Visit: Payer: 59 | Admitting: Cardiology

## 2022-01-29 ENCOUNTER — Other Ambulatory Visit (HOSPITAL_COMMUNITY): Payer: Self-pay

## 2022-02-01 ENCOUNTER — Other Ambulatory Visit (HOSPITAL_COMMUNITY): Payer: Self-pay

## 2022-02-03 ENCOUNTER — Other Ambulatory Visit (HOSPITAL_COMMUNITY): Payer: Self-pay

## 2022-02-03 MED ORDER — GABAPENTIN 300 MG PO CAPS
ORAL_CAPSULE | ORAL | 3 refills | Status: DC
  Filled 2022-02-03: qty 180, 90d supply, fill #0
  Filled 2022-05-18: qty 180, 90d supply, fill #1

## 2022-02-11 ENCOUNTER — Other Ambulatory Visit: Payer: 59

## 2022-02-19 ENCOUNTER — Other Ambulatory Visit (HOSPITAL_COMMUNITY): Payer: Self-pay

## 2022-02-19 ENCOUNTER — Ambulatory Visit: Payer: 59 | Admitting: Cardiology

## 2022-02-19 MED ORDER — INSULIN LISPRO (1 UNIT DIAL) 100 UNIT/ML (KWIKPEN)
PEN_INJECTOR | SUBCUTANEOUS | 12 refills | Status: DC
  Filled 2022-02-19: qty 45, 45d supply, fill #0
  Filled 2022-05-28: qty 45, 45d supply, fill #1
  Filled 2022-09-12: qty 45, 45d supply, fill #2
  Filled 2023-01-01: qty 30, 30d supply, fill #3
  Filled 2023-01-01: qty 45, 45d supply, fill #3
  Filled 2023-02-02 – 2023-02-03 (×2): qty 30, 30d supply, fill #4

## 2022-02-22 ENCOUNTER — Other Ambulatory Visit: Payer: Self-pay | Admitting: Registered Nurse

## 2022-02-22 ENCOUNTER — Other Ambulatory Visit (HOSPITAL_COMMUNITY): Payer: Self-pay

## 2022-02-22 DIAGNOSIS — M5412 Radiculopathy, cervical region: Secondary | ICD-10-CM | POA: Diagnosis not present

## 2022-02-22 DIAGNOSIS — I5189 Other ill-defined heart diseases: Secondary | ICD-10-CM | POA: Diagnosis not present

## 2022-02-22 DIAGNOSIS — I739 Peripheral vascular disease, unspecified: Secondary | ICD-10-CM | POA: Diagnosis not present

## 2022-02-22 DIAGNOSIS — E103593 Type 1 diabetes mellitus with proliferative diabetic retinopathy without macular edema, bilateral: Secondary | ICD-10-CM | POA: Diagnosis not present

## 2022-02-23 ENCOUNTER — Other Ambulatory Visit (HOSPITAL_COMMUNITY): Payer: Self-pay

## 2022-02-23 MED ORDER — TAMSULOSIN HCL 0.4 MG PO CAPS
0.4000 mg | ORAL_CAPSULE | Freq: Every day | ORAL | 3 refills | Status: DC
  Filled 2022-02-23: qty 90, 90d supply, fill #0
  Filled 2022-05-31: qty 90, 90d supply, fill #1
  Filled 2022-08-26: qty 90, 90d supply, fill #2
  Filled 2022-12-05: qty 90, 90d supply, fill #3

## 2022-02-23 MED ORDER — PREDNISONE 10 MG PO TABS
ORAL_TABLET | ORAL | 0 refills | Status: DC
  Filled 2022-02-23: qty 42, 12d supply, fill #0

## 2022-02-27 ENCOUNTER — Ambulatory Visit
Admission: RE | Admit: 2022-02-27 | Discharge: 2022-02-27 | Disposition: A | Discharge status: Home / Self Care | Payer: 59 | Source: Ambulatory Visit | Attending: Registered Nurse | Admitting: Registered Nurse

## 2022-02-27 ENCOUNTER — Other Ambulatory Visit (HOSPITAL_COMMUNITY): Payer: Self-pay

## 2022-02-27 DIAGNOSIS — M47812 Spondylosis without myelopathy or radiculopathy, cervical region: Secondary | ICD-10-CM | POA: Diagnosis not present

## 2022-02-27 DIAGNOSIS — M5022 Other cervical disc displacement, mid-cervical region, unspecified level: Secondary | ICD-10-CM | POA: Diagnosis not present

## 2022-02-27 DIAGNOSIS — M5412 Radiculopathy, cervical region: Secondary | ICD-10-CM

## 2022-02-27 MED ORDER — LEVOTHYROXINE SODIUM 100 MCG PO TABS
100.0000 ug | ORAL_TABLET | Freq: Every day | ORAL | 3 refills | Status: DC
  Filled 2022-02-27: qty 90, 90d supply, fill #0
  Filled 2022-05-31 – 2022-06-02 (×2): qty 90, 90d supply, fill #1
  Filled 2022-08-26: qty 90, 90d supply, fill #2
  Filled 2022-12-05: qty 90, 90d supply, fill #3

## 2022-02-27 MED ORDER — TRAZODONE HCL 150 MG PO TABS
150.0000 mg | ORAL_TABLET | Freq: Every evening | ORAL | 2 refills | Status: DC | PRN
  Filled 2022-02-27: qty 90, 90d supply, fill #0
  Filled 2022-05-31: qty 90, 90d supply, fill #1
  Filled 2022-08-26: qty 90, 90d supply, fill #2

## 2022-03-02 ENCOUNTER — Other Ambulatory Visit (HOSPITAL_COMMUNITY): Payer: Self-pay

## 2022-03-03 ENCOUNTER — Other Ambulatory Visit (HOSPITAL_COMMUNITY): Payer: Self-pay

## 2022-03-09 ENCOUNTER — Ambulatory Visit: Payer: 59

## 2022-03-09 DIAGNOSIS — E1059 Type 1 diabetes mellitus with other circulatory complications: Secondary | ICD-10-CM

## 2022-03-09 DIAGNOSIS — R0609 Other forms of dyspnea: Secondary | ICD-10-CM

## 2022-03-09 DIAGNOSIS — I6523 Occlusion and stenosis of bilateral carotid arteries: Secondary | ICD-10-CM | POA: Diagnosis not present

## 2022-03-09 DIAGNOSIS — I2510 Atherosclerotic heart disease of native coronary artery without angina pectoris: Secondary | ICD-10-CM

## 2022-03-16 ENCOUNTER — Ambulatory Visit: Payer: 59 | Admitting: Cardiology

## 2022-03-16 NOTE — Progress Notes (Signed)
Called and spoke with patient regarding his CAD results. Patient stated that he was asked to discontinue Diltiazem.

## 2022-03-19 ENCOUNTER — Other Ambulatory Visit (HOSPITAL_COMMUNITY): Payer: Self-pay

## 2022-03-19 MED ORDER — VARENICLINE TARTRATE 0.5 MG X 11 & 1 MG X 42 PO TBPK
ORAL_TABLET | ORAL | 0 refills | Status: DC
  Filled 2022-03-19: qty 53, 28d supply, fill #0

## 2022-03-20 ENCOUNTER — Other Ambulatory Visit (HOSPITAL_COMMUNITY): Payer: Self-pay

## 2022-03-23 ENCOUNTER — Other Ambulatory Visit (HOSPITAL_COMMUNITY): Payer: Self-pay

## 2022-03-25 DIAGNOSIS — E063 Autoimmune thyroiditis: Secondary | ICD-10-CM | POA: Diagnosis not present

## 2022-03-25 DIAGNOSIS — M2550 Pain in unspecified joint: Secondary | ICD-10-CM | POA: Diagnosis not present

## 2022-03-25 DIAGNOSIS — E785 Hyperlipidemia, unspecified: Secondary | ICD-10-CM | POA: Diagnosis not present

## 2022-03-25 DIAGNOSIS — M5412 Radiculopathy, cervical region: Secondary | ICD-10-CM | POA: Diagnosis not present

## 2022-03-25 DIAGNOSIS — Z794 Long term (current) use of insulin: Secondary | ICD-10-CM | POA: Diagnosis not present

## 2022-03-25 DIAGNOSIS — I2510 Atherosclerotic heart disease of native coronary artery without angina pectoris: Secondary | ICD-10-CM | POA: Diagnosis not present

## 2022-03-25 DIAGNOSIS — E103593 Type 1 diabetes mellitus with proliferative diabetic retinopathy without macular edema, bilateral: Secondary | ICD-10-CM | POA: Diagnosis not present

## 2022-03-25 DIAGNOSIS — I6523 Occlusion and stenosis of bilateral carotid arteries: Secondary | ICD-10-CM | POA: Diagnosis not present

## 2022-03-25 DIAGNOSIS — F419 Anxiety disorder, unspecified: Secondary | ICD-10-CM | POA: Diagnosis not present

## 2022-03-26 ENCOUNTER — Ambulatory Visit: Payer: 59 | Admitting: Cardiology

## 2022-03-26 ENCOUNTER — Encounter: Payer: Self-pay | Admitting: Cardiology

## 2022-03-26 VITALS — BP 116/71 | HR 78 | Temp 97.8°F | Resp 16 | Ht 69.0 in | Wt 176.6 lb

## 2022-03-26 DIAGNOSIS — I2510 Atherosclerotic heart disease of native coronary artery without angina pectoris: Secondary | ICD-10-CM | POA: Diagnosis not present

## 2022-03-26 DIAGNOSIS — E1059 Type 1 diabetes mellitus with other circulatory complications: Secondary | ICD-10-CM

## 2022-03-26 DIAGNOSIS — E782 Mixed hyperlipidemia: Secondary | ICD-10-CM

## 2022-03-26 DIAGNOSIS — R0609 Other forms of dyspnea: Secondary | ICD-10-CM | POA: Diagnosis not present

## 2022-03-26 DIAGNOSIS — Z87891 Personal history of nicotine dependence: Secondary | ICD-10-CM | POA: Diagnosis not present

## 2022-03-26 DIAGNOSIS — Z794 Long term (current) use of insulin: Secondary | ICD-10-CM

## 2022-03-26 DIAGNOSIS — I2584 Coronary atherosclerosis due to calcified coronary lesion: Secondary | ICD-10-CM | POA: Diagnosis not present

## 2022-03-26 NOTE — Progress Notes (Signed)
ID:  James [name], DOB [birth year], MRN [id number]  PCP:  [name], MD  Cardiologist:  Rex Kras, DO, [facility] (established care 04/16/2020)  Date: 03/26/22 Last Office Visit: 01/14/2022  Chief Complaint  Patient presents with   Shortness of Breath   Results   Follow-up    HPI  James [name] is a 66 y.o. male whose past medical history and cardiovascular risk factors include: Insulin-dependent diabetes mellitus type 1, hypothyroidism, former smoker (40-year pack history of smoking), hyperlipidemia,  coronary artery calcification, aortic atherosclerosis.   He was referred to the practice for evaluation of coronary artery calcification and atherosclerosis of the aorta which was noted incidentally on a CTA chest in May 2021.  Given his cardiovascular risk factors he underwent a left heart catheterization which noted microvascular dysfunction without any significant epicardial coronary artery disease and amenable to intervention.  He was educated on the importance of improving his modifiable cardiovascular risk factors.  During last office visit in June 2023 he was having symptoms of feeling tired/fatigue/decreased physical endurance orthopnea, dyspnea.  He was recommended to undergo an echocardiogram to reevaluate LVEF, valvular heart disease, RVSP to screen for pulmonary hypertension.  Additional labs were ordered which are still pending.  Shortness of breath with effort related activities remains relatively stable with no significant improvement.  Patient has not had a chance to follow-up with pulmonary medicine as recommended at the last visit.  Denies PND or lower extremity swelling.  He denies anginal discomfort.  ALLERGIES: Allergies  Allergen Reactions   Augmentin [Amoxicillin-Pot Clavulanate] Nausea And Vomiting and Other (See Comments)    Did it involve swelling of the face/tongue/throat, SOB, or low BP? No Did it involve sudden or severe rash/hives, skin peeling, or any reaction on  the inside of your mouth or nose? No Did you need to seek medical attention at a hospital or doctor's office? No When did it last happen?      5+ years If all above answers are "NO", may proceed with cephalosporin use.    Lexapro [Escitalopram Oxalate] Itching    MEDICATION LIST PRIOR TO VISIT: Current Meds  Medication Sig   acetaminophen (TYLENOL) 500 MG tablet Take 1,000 mg by mouth every 6 (six) hours as needed for mild pain or headache.   albuterol (VENTOLIN HFA) 108 (90 Base) MCG/ACT inhaler INHALE 2 PUFFS BY MOUTH INTO THE LUNGS EVERY 6 HOURS AS NEEDED FOR WHEEZING OR SHORTNESS OF BREATH.   aspirin EC 81 MG tablet Take 1 tablet (81 mg total) by mouth daily. Swallow whole. (Patient taking differently: Take 81 mg by mouth at bedtime. Swallow whole.)   azelastine (ASTELIN) 0.1 % nasal spray Place 2 sprays into both nostrils 2 (two) times daily   cetirizine (ZYRTEC) 10 MG tablet Take 10 mg by mouth daily.   fluticasone (FLONASE) 50 MCG/ACT nasal spray Place 2 sprays into both nostrils daily.   gabapentin (NEURONTIN) 300 MG capsule Take 1 capsule by mouth twice daily as needed   Glucagon, rDNA, (GLUCAGON EMERGENCY) 1 MG KIT USE AS NEEDED FOR SEVERE HYPOGLYCEMIA   glucose blood (CONTOUR NEXT TEST) test strip USE 8 TIMES DAILY AS DIRECTED TO MONITOR BLOOD GLUCOSE   ibuprofen (ADVIL) 200 MG tablet Take 800 mg by mouth every 8 (eight) hours as needed (pain.).   insulin glargine-yfgn (SEMGLEE, YFGN,) 100 UNIT/ML Pen Inject 38 units subcutaneously daily as directed   insulin lispro (HUMALOG KWIKPEN) 100 UNIT/ML KwikPen INJECT UP TO 100 UNITS A Mcmichael UNDER THE  SKIN AS DIRECTED   Insulin Pen Needle 31G X 8 MM MISC Use 1 pen needle subcutaneously 4 - 5 times daily as directed.   levothyroxine (SYNTHROID) 100 MCG tablet Take 1 tablet (100 mcg total) by mouth daily.   metoCLOPramide (REGLAN) 5 MG tablet TAKE 1 TABLET BY MOUTH TWICE DAILY BEFORE A MEAL.   pantoprazole (PROTONIX) 40 MG tablet Take 1  tablet by mouth daily   simvastatin (ZOCOR) 40 MG tablet TAKE 1 TABLET BY MOUTH ONCE DAILY   tamsulosin (FLOMAX) 0.4 MG CAPS capsule Take 1 capsule by mouth daily   traZODone (DESYREL) 150 MG tablet Take 1 tablet (150 mg total) by mouth at bedtime as needed for sleep.   varenicline (CHANTIX PAK) 0.5 MG X 11 & 1 MG X 42 tablet Take 0.$RemoveBefor'5mg'ffstaFQSgUBD$  every morning for 3 days, 0.$RemoveB'5mg'qniHlQpu$  morning and evening for 3 days, $Remov'1mg'BgFbZb$  in the morning and evening.     PAST MEDICAL HISTORY: Past Medical History:  Diagnosis Date   ANXIETY 04/03/2007   Anxiety    ASTHMA 09/06/2008   Asthma    ASTHMATIC BRONCHITIS, ACUTE 10/25/2008   Bladder neck obstruction    CARPAL TUNNEL SYNDROME, BILATERAL 07/31/2007   issues resolved, no surgery   Cervical disc disease    Chronic bronchitis ([place])    "get it about q yr" (02/12/2014)   COPD (chronic obstructive pulmonary disease) ([place])    CORONARY ARTERY DISEASE 04/03/2007   DEPRESSION 04/03/2007   Depression    DIABETES MELLITUS, TYPE I 04/03/2007   Diabetic retinopathy associated with diabetes mellitus due to underlying condition ([place]) 04/03/2007   DM W/EYE MANIFESTATIONS, TYPE I, UNCONTROLLED 04/04/2007   DM W/RENAL MNFST, TYPE I, UNCONTROLLED 04/04/2007   ED (erectile dysfunction)    History of kidney stones    HYPERLIPIDEMIA 04/04/2007   Pneumonia    "several times and again today" (02/13/2104)   Renal insufficiency    Seizures ([place])    "insulin seizure from time to time; none in the last couple years" (02/12/2014)   Spinal stenosis     PAST SURGICAL HISTORY: Past Surgical History:  Procedure Laterality Date   ANTERIOR CERVICAL DECOMP/DISCECTOMY FUSION  2000   "couple screws and a plate"   ANTERIOR CERVICAL DECOMP/DISCECTOMY FUSION N/A 06/24/2016   Procedure: ANTERIOR CERVICAL DECOMPRESSION/DISCECTOMY FUSION CERVICAL FOUR - CERVICAL FIVE, CERVICAL FIVE - CERVICAL SIX; REMOVAL TETHER CERVICAL PLATE;  Surgeon: Jovita Gamma, MD;  Location: [place];  Service: Neurosurgery;   Laterality: N/A;  ANTERIOR CERVICAL DECOMPRESSION/DISCECTOMY FUSION CERVICAL FOUR - CERVICAL FIVE, CERVICAL FIVE - CERVICAL SIX; REMOVAL TETHER CERVICAL PLATE   APPENDECTOMY     BACK SURGERY     CARDIAC CATHETERIZATION  1990's   CATARACT EXTRACTION W/ INTRAOCULAR LENS  IMPLANT, BILATERAL Bilateral    CYSTOSCOPY WITH RETROGRADE PYELOGRAM, URETEROSCOPY AND STENT PLACEMENT Bilateral 04/06/2013   Procedure: BILATERAL CYSTOSCOPY WITH RETROGRADE PYELOGRAMS, STENT PLACEMENTS AND LEFT URETEROSCOPY AND STONE REMOVAL;  Surgeon: Alexis Frock, MD;  Location: [facility];  Service: Urology;  Laterality: Bilateral;   CYSTOSCOPY WITH STENT PLACEMENT Right 04/12/2013   Procedure: CYSTOSCOPY WITH STENT PLACEMENT;  Surgeon: Alexis Frock, MD;  Location: [facility];  Service: Urology;  Laterality: Right;   CYSTOSCOPY/RETROGRADE/URETEROSCOPY Bilateral 04/12/2013   Procedure: CYSTOSCOPY/RETROGRADE/URETEROSCOPY;  Surgeon: Alexis Frock, MD;  Location: [facility];  Service: Urology;  Laterality: Bilateral;  RIGHT RETROGRADE    HOLMIUM LASER APPLICATION Left 0/78/6754   Procedure: HOLMIUM LASER APPLICATION;  Surgeon: Alexis Frock, MD;  Location: [facility];  Service: Urology;  Laterality:  Left;   LEFT HEART CATH AND CORONARY ANGIOGRAPHY N/A 04/29/2020   Procedure: LEFT HEART CATH AND CORONARY ANGIOGRAPHY;  Surgeon: Nigel Mormon, MD;  Location: [facility];  Service: Cardiovascular;  Laterality: N/A;   LUMBAR LAMINECTOMY/DECOMPRESSION MICRODISCECTOMY N/A 04/20/2019   Procedure: Lumbar microdisectomy and decompression L5-S1 left;  Surgeon: Latanya Maudlin, MD;  Location: [facility];  Service: Orthopedics;  Laterality: N/A;  46min   LYMPH NODE DISSECTION  ~ 1960   groin   stress cardiolite  09/06/2002   TONSILLECTOMY     VITRECTOMY Bilateral     FAMILY HISTORY: The patient family history includes Cancer in his mother; Diabetes in his brother; Heart disease in his brother; Stroke in his father.  SOCIAL HISTORY:  The  patient  reports that he quit smoking about 2 years ago. His smoking use included cigarettes. He has never used smokeless tobacco. He reports that he does not drink alcohol and does not use drugs.  REVIEW OF SYSTEMS: Review of Systems  Constitutional: Positive for malaise/fatigue. Negative for chills and fever.  HENT:  Negative for hoarse voice and nosebleeds.   Eyes:  Negative for discharge, double vision and pain.  Cardiovascular:  Positive for dyspnea on exertion (same) and orthopnea. Negative for chest pain, claudication, leg swelling, near-syncope, palpitations, paroxysmal nocturnal dyspnea and syncope.  Respiratory:  Positive for shortness of breath. Negative for hemoptysis.   Musculoskeletal:  Negative for muscle cramps and myalgias.  Gastrointestinal:  Negative for abdominal pain, constipation, diarrhea, hematemesis, hematochezia, melena, nausea and vomiting.  Neurological:  Positive for paresthesias (left elbow to wrist.). Negative for dizziness and light-headedness.    PHYSICAL EXAM:    03/26/2022   11:04 AM 01/14/2022    1:49 PM 11/05/2021    2:28 PM  Vitals with BMI  Height $Remov'5\' 9"'tGuEbj$  $Remove'5\' 9"'HSElMDC$  $RemoveB'5\' 9"'GsVPUDQz$   Weight 176 lbs 10 oz 179 lbs 3 oz 179 lbs 10 oz  BMI 26.07 11.55 20.80  Systolic 223 361 224  Diastolic 71 69 68  Pulse 78 75 90    CONSTITUTIONAL: Well-developed and well-nourished. No acute distress.  SKIN: Skin is warm and dry. No rash noted. No cyanosis. No pallor. No jaundice HEAD: Normocephalic and atraumatic.  EYES: No scleral icterus MOUTH/THROAT: Moist oral membranes.  NECK: No JVD present. No thyromegaly noted. Left carotid bruit.  CHEST Normal respiratory effort. No intercostal retractions  LUNGS: Clear to auscultation bilaterally with the exception of the rhonchi in the left lower lung fields. No stridor. No wheezes. No rales.  CARDIOVASCULAR: Regular rate and rhythm, positive S1-S2, no murmurs rubs or gallops appreciated. ABDOMINAL: Soft, nontender, distended, positive  bowel sounds in all 4 quadrants, no apparent ascites.  EXTREMITIES: No peripheral edema, decreased DP and PT bilaterally.  HEMATOLOGIC: No significant bruising NEUROLOGIC: Oriented to person, place, and time. Nonfocal. Normal muscle tone.  PSYCHIATRIC: Normal mood and affect. Normal behavior. Cooperative  CARDIAC DATABASE: EKG: 01/14/2022: Normal sinus rhythm, 65 bpm, normal axis, frequent PVCs, without underlying injury pattern.  Echocardiogram: 03/09/2022: Normal LV systolic function with visual EF 55-60%. Left ventricle cavity is normal in size. Normal left ventricular wall thickness. Normal global wall motion. Doppler evidence of grade I (impaired) diastolic dysfunction, normal LAP. Calculated EF 57%. Structurally normal tricuspid valve. Mild tricuspid regurgitation. No evidence of pulmonary hypertension. No significant change compared to prior.   Stress Testing: No results found for this or any previous visit from the past 1095 days.  Heart Catheterization:  04/29/2020: LM: Normal LAD: 75% stenosis  in mid small caliber distal LAD. TIMI II flow, only marginally improved after IC NTG 200 mg. LCx: Large dominant vessel with TIMI II flow, only marginally improved after IC NTG 200 mg RCA: Small non-dominant vessel with TIMI II flow Suspect microvascular dysfunction. Consider addition of calcium channel blocker.   Carotid artery duplex 03/09/2022: Duplex suggests stenosis in the right internal carotid artery (minimal). Duplex suggests stenosis in the right external carotid artery (<50%). Duplex suggests stenosis in the left internal carotid artery (minimal). Duplex suggests stenosis in the left external carotid artery (<50%). Antegrade right vertebral artery flow. Antegrade left vertebral artery flow. No change from 06/21/2021. Follow up in one year is appropriate if clinically indicated.  LABORATORY DATA:    Latest Ref Rng & Units 07/31/2021   11:20 AM 04/29/2020    1:50 PM  04/22/2020    8:47 AM  CBC  WBC 3.4 - 10.8 x10E3/uL   4.0   Hemoglobin 13.0 - 17.7 g/dL 14.9  14.3  15.8   Hematocrit 37.5 - 51.0 % 42.3  42.0  46.1   Platelets 150 - 450 x10E3/uL   266        Latest Ref Rng & Units 07/31/2021   11:20 AM 04/29/2020    1:50 PM 04/29/2020    1:32 PM  CMP  Glucose 70 - 99 mg/dL 89  115  114   BUN 8 - 27 mg/dL $Remove'17  16  14   'ePaayTr$ Creatinine 0.76 - 1.27 mg/dL 1.09  0.80  0.86   Sodium 134 - 144 mmol/L 137  140  138   Potassium 3.5 - 5.2 mmol/L 4.5  4.3  4.5   Chloride 96 - 106 mmol/L 100  104  107   CO2 20 - 29 mmol/L 21   23   Calcium 8.6 - 10.2 mg/dL 9.4   8.8     Lipid Panel     Component Value Date/Time   CHOL 124 05/08/2010 1434   TRIG 38.0 05/08/2010 1434   HDL 54.20 05/08/2010 1434   CHOLHDL 2 05/08/2010 1434   VLDL 7.6 05/08/2010 1434   LDLCALC 62 05/08/2010 1434    No components found for: "NTPROBNP" Recent Labs    07/31/21 1120  PROBNP 42   No results for input(s): "TSH" in the last 8760 hours.  BMP Recent Labs    07/31/21 1120  NA 137  K 4.5  CL 100  CO2 21  GLUCOSE 89  BUN 17  CREATININE 1.09  CALCIUM 9.4     HEMOGLOBIN A1C Lab Results  Component Value Date   HGBA1C 6.5 (H) 10/26/2019   MPG 140 10/26/2019   External Labs: Collected: 01/23/2020 Creatinine 1 mg/dL. eGFR: 75 mL/min per 1.73 m Potassium 4.6 TSH: 4.92 and free T4 110 ng/dL   IMPRESSION:    ICD-10-CM   1. Dyspnea on exertion  R06.09 Cardiopulmonary exercise test    2. Coronary atherosclerosis due to calcified coronary lesion of native artery  I25.10    I25.84     3. Type 1 diabetes mellitus with other circulatory complication (HCC)  Q19.75     4. Long-term insulin use (HCC)  Z79.4     5. Mixed hyperlipidemia  E78.2     6. Former smoker  Z87.891        RECOMMENDATIONS: James [name] is a 66 y.o. male whose past medical history and cardiac risk factors include: Insulin-dependent diabetes mellitus type 1, hypothyroidism, former smoker  (40-year pack history of smoking), hyperlipidemia,  coronary artery calcification, aortic atherosclerosis.   Dyspnea on exertion Chronic and stable. No significant change since last visit. Echocardiogram notes preserved LVEF, grade 1 diastolic impairment, no significant valvular heart disease Last ischemic work-up including left heart catheterization 08/2019. Schedule cardiopulmonary stress test. Over the last office visit ordered CBC to evaluate for anemia, D-dimer, BNP, BMP follow-up which are still pending.  We encouraged to have these labs done. Prior 40 year history of smoking recommend evaluation for COPD or other pulmonary conditions.  He is encouraged to return to pulmonary medicine.  Coronary atherosclerosis due to calcified coronary lesion of native artery Denies angina pectoris. Medications reconciled. Prior cardiovascular testing results reviewed.  Mixed hyperlipidemia Currently managed by PCP.  FINAL MEDICATION LIST END OF ENCOUNTER: No orders of the defined types were placed in this encounter.    Current Outpatient Medications:    acetaminophen (TYLENOL) 500 MG tablet, Take 1,000 mg by mouth every 6 (six) hours as needed for mild pain or headache., Disp: , Rfl:    albuterol (VENTOLIN HFA) 108 (90 Base) MCG/ACT inhaler, INHALE 2 PUFFS BY MOUTH INTO THE LUNGS EVERY 6 HOURS AS NEEDED FOR WHEEZING OR SHORTNESS OF BREATH., Disp: 18 g, Rfl: 2   aspirin EC 81 MG tablet, Take 1 tablet (81 mg total) by mouth daily. Swallow whole. (Patient taking differently: Take 81 mg by mouth at bedtime. Swallow whole.), Disp: 30 tablet, Rfl: 11   azelastine (ASTELIN) 0.1 % nasal spray, Place 2 sprays into both nostrils 2 (two) times daily, Disp: 30 mL, Rfl: 3   cetirizine (ZYRTEC) 10 MG tablet, Take 10 mg by mouth daily., Disp: , Rfl:    fluticasone (FLONASE) 50 MCG/ACT nasal spray, Place 2 sprays into both nostrils daily., Disp: , Rfl:    gabapentin (NEURONTIN) 300 MG capsule, Take 1 capsule by  mouth twice daily as needed, Disp: 180 capsule, Rfl: 3   Glucagon, rDNA, (GLUCAGON EMERGENCY) 1 MG KIT, USE AS NEEDED FOR SEVERE HYPOGLYCEMIA, Disp: 2 kit, Rfl: 12   glucose blood (CONTOUR NEXT TEST) test strip, USE 8 TIMES DAILY AS DIRECTED TO MONITOR BLOOD GLUCOSE, Disp: 600 strip, Rfl: 3   ibuprofen (ADVIL) 200 MG tablet, Take 800 mg by mouth every 8 (eight) hours as needed (pain.)., Disp: , Rfl:    insulin glargine-yfgn (SEMGLEE, YFGN,) 100 UNIT/ML Pen, Inject 38 units subcutaneously daily as directed, Disp: 45 mL, Rfl: 3   insulin lispro (HUMALOG KWIKPEN) 100 UNIT/ML KwikPen, INJECT UP TO 100 UNITS A Betzer UNDER THE SKIN AS DIRECTED, Disp: 45 mL, Rfl: 12   Insulin Pen Needle 31G X 8 MM MISC, Use 1 pen needle subcutaneously 4 - 5 times daily as directed., Disp: 500 each, Rfl: 3   levothyroxine (SYNTHROID) 100 MCG tablet, Take 1 tablet (100 mcg total) by mouth daily., Disp: 90 tablet, Rfl: 3   metoCLOPramide (REGLAN) 5 MG tablet, TAKE 1 TABLET BY MOUTH TWICE DAILY BEFORE A MEAL., Disp: 180 tablet, Rfl: 3   pantoprazole (PROTONIX) 40 MG tablet, Take 1 tablet by mouth daily, Disp: 90 tablet, Rfl: 12   simvastatin (ZOCOR) 40 MG tablet, TAKE 1 TABLET BY MOUTH ONCE DAILY, Disp: 90 tablet, Rfl: 3   tamsulosin (FLOMAX) 0.4 MG CAPS capsule, Take 1 capsule by mouth daily, Disp: 90 capsule, Rfl: 3   traZODone (DESYREL) 150 MG tablet, Take 1 tablet (150 mg total) by mouth at bedtime as needed for sleep., Disp: 90 tablet, Rfl: 2   varenicline (CHANTIX PAK) 0.5 MG X 11 &  1 MG X 42 tablet, Take 0.$RemoveBefore'5mg'MKmKXZaFDywAg$  every morning for 3 days, 0.$RemoveB'5mg'nqHxvLjj$  morning and evening for 3 days, $Remov'1mg'hYvCAc$  in the morning and evening., Disp: 53 tablet, Rfl: 0  Orders Placed This Encounter  Procedures   Cardiopulmonary exercise test    There are no Patient Instructions on file for this visit.   --Continue cardiac medications as reconciled in final medication list. --Return in about 6 months (around 09/24/2022) for Follow up, Dyspnea. Or sooner if  needed. --Continue follow-up with your primary care physician regarding the management of your other chronic comorbid conditions.  Patient's questions and concerns were addressed to his satisfaction. He voices understanding of the instructions provided during this encounter.   This note was created using a voice recognition software as a result there may be grammatical errors inadvertently enclosed that do not reflect the nature of this encounter. Every attempt is made to correct such errors.  Rex Kras, [place], [facility]  Pager: [phone] Office: [phone]

## 2022-03-30 ENCOUNTER — Other Ambulatory Visit (HOSPITAL_COMMUNITY): Payer: Self-pay

## 2022-04-06 ENCOUNTER — Ambulatory Visit: Payer: 59 | Admitting: Cardiology

## 2022-04-07 ENCOUNTER — Other Ambulatory Visit (HOSPITAL_COMMUNITY): Payer: Self-pay

## 2022-04-08 ENCOUNTER — Other Ambulatory Visit (HOSPITAL_COMMUNITY): Payer: Self-pay

## 2022-04-08 DIAGNOSIS — I2510 Atherosclerotic heart disease of native coronary artery without angina pectoris: Secondary | ICD-10-CM | POA: Diagnosis not present

## 2022-04-08 DIAGNOSIS — Z794 Long term (current) use of insulin: Secondary | ICD-10-CM | POA: Diagnosis not present

## 2022-04-08 DIAGNOSIS — E103593 Type 1 diabetes mellitus with proliferative diabetic retinopathy without macular edema, bilateral: Secondary | ICD-10-CM | POA: Diagnosis not present

## 2022-04-08 MED ORDER — METOCLOPRAMIDE HCL 5 MG PO TABS
5.0000 mg | ORAL_TABLET | Freq: Two times a day (BID) | ORAL | 3 refills | Status: DC
  Filled 2022-04-08: qty 180, 90d supply, fill #0
  Filled 2022-08-26: qty 180, 90d supply, fill #1
  Filled 2023-02-13: qty 180, 90d supply, fill #2

## 2022-04-08 MED ORDER — FREESTYLE LIBRE 3 SENSOR MISC
6 refills | Status: AC
  Filled 2022-04-08: qty 2, 28d supply, fill #0
  Filled 2022-05-15: qty 2, 28d supply, fill #1
  Filled 2022-06-14: qty 2, 28d supply, fill #2
  Filled 2022-07-06: qty 2, 28d supply, fill #3
  Filled 2022-07-24: qty 2, 28d supply, fill #4
  Filled 2022-09-04: qty 2, 28d supply, fill #5

## 2022-04-09 ENCOUNTER — Other Ambulatory Visit (HOSPITAL_COMMUNITY): Payer: Self-pay

## 2022-04-10 ENCOUNTER — Other Ambulatory Visit (HOSPITAL_COMMUNITY): Payer: Self-pay

## 2022-04-12 ENCOUNTER — Other Ambulatory Visit (HOSPITAL_COMMUNITY): Payer: Self-pay

## 2022-04-12 MED ORDER — INSULIN GLARGINE-YFGN 100 UNIT/ML ~~LOC~~ SOPN
PEN_INJECTOR | SUBCUTANEOUS | 12 refills | Status: DC
  Filled 2022-04-12: qty 30, 79d supply, fill #0
  Filled 2022-06-29: qty 30, 79d supply, fill #1
  Filled 2022-09-13: qty 30, 79d supply, fill #2

## 2022-04-12 MED ORDER — VARENICLINE TARTRATE 1 MG PO TABS
1.0000 mg | ORAL_TABLET | Freq: Two times a day (BID) | ORAL | 1 refills | Status: DC
  Filled 2022-04-12: qty 56, 28d supply, fill #0
  Filled 2022-05-21: qty 56, 28d supply, fill #1

## 2022-04-13 ENCOUNTER — Other Ambulatory Visit (HOSPITAL_COMMUNITY): Payer: Self-pay

## 2022-04-13 DIAGNOSIS — R0609 Other forms of dyspnea: Secondary | ICD-10-CM | POA: Diagnosis not present

## 2022-04-14 ENCOUNTER — Other Ambulatory Visit (HOSPITAL_COMMUNITY): Payer: Self-pay

## 2022-04-21 ENCOUNTER — Other Ambulatory Visit (HOSPITAL_COMMUNITY): Payer: Self-pay

## 2022-04-21 ENCOUNTER — Telehealth: Payer: Self-pay

## 2022-04-21 DIAGNOSIS — R7989 Other specified abnormal findings of blood chemistry: Secondary | ICD-10-CM | POA: Diagnosis not present

## 2022-04-21 DIAGNOSIS — R0602 Shortness of breath: Secondary | ICD-10-CM | POA: Diagnosis not present

## 2022-04-22 ENCOUNTER — Ambulatory Visit (HOSPITAL_COMMUNITY): Payer: 59 | Attending: Cardiology

## 2022-04-22 ENCOUNTER — Other Ambulatory Visit (HOSPITAL_COMMUNITY): Payer: Self-pay

## 2022-04-22 NOTE — Telephone Encounter (Signed)
Will order V/Q Scan and LE Venous Duplex.  Please arrange it in the next 24-48hrs.   Dr. Terri Skains

## 2022-04-22 NOTE — Telephone Encounter (Signed)
Patient continues to have unexplained shortness of breath and therefore was recommended to undergo laboratory testing.  The labs originally ordered on 01/14/2022 but they still were not performed at the last office visit in September 2023.  He did have them done with his current appointment with PCP and the labs were forwarded to [place] for further reference.  Patient continues to have shortness of breath overall symptoms have not increased in intensity frequency and duration.  The D-dimers are greater even after correcting for age.  Will order V/Q scan and LE venous duplex.   External Labs: Collected: 04/13/2022 D-dimer 1.05 mg/L Hemoglobin 14.4 g/dL, hematocrit 41.8%. NT proBNP 72. BUN 18, creatinine 0.94. Sodium 140, potassium 4.3, chloride 100, bicarb 24. eGFR 89 A1c 6.8  Orders Placed This Encounter  Procedures   NM Pulmonary Perf and Vent    Standing Status:   Future    Standing Expiration Date:   04/23/2023    Order Specific Question:   If indicated for the ordered procedure, I authorize the administration of a radiopharmaceutical per Radiology protocol    Answer:   Yes    Order Specific Question:   Preferred imaging location?    Answer:   [facility]   DG Chest 2 View    Standing Status:   Future    Standing Expiration Date:   04/23/2023    Order Specific Question:   Reason for Exam (SYMPTOM  OR DIAGNOSIS REQUIRED)    Answer:   w/ V/Q scan    Order Specific Question:   Preferred imaging location?    Answer:   [place], [place], [facility]  Pager: [phone] Office: [phone]

## 2022-04-23 ENCOUNTER — Ambulatory Visit (HOSPITAL_COMMUNITY): Payer: 59 | Attending: Cardiology

## 2022-04-23 ENCOUNTER — Other Ambulatory Visit (HOSPITAL_COMMUNITY): Payer: Self-pay

## 2022-04-23 ENCOUNTER — Ambulatory Visit (HOSPITAL_COMMUNITY): Admission: RE | Admit: 2022-04-23 | Payer: 59 | Source: Ambulatory Visit

## 2022-04-23 MED ORDER — CONTOUR NEXT TEST VI STRP
ORAL_STRIP | 4 refills | Status: AC
  Filled 2022-04-23: qty 350, 43d supply, fill #0
  Filled 2022-09-12: qty 600, 75d supply, fill #0
  Filled 2022-12-28: qty 100, 12d supply, fill #0

## 2022-04-26 ENCOUNTER — Other Ambulatory Visit (HOSPITAL_COMMUNITY): Payer: Self-pay

## 2022-04-26 ENCOUNTER — Telehealth: Payer: Self-pay | Admitting: Acute Care

## 2022-04-26 NOTE — Telephone Encounter (Addendum)
Perf particulate scan rescheduled to 10/13 with 10:00 check-in at [facility] - to get cxr prior to scan.  Left vm for pt to call me back for appt info.

## 2022-04-26 NOTE — Telephone Encounter (Signed)
Can you call his secondary numbers.   Dr. Terri Skains

## 2022-04-26 NOTE — Telephone Encounter (Signed)
Spoke to pt & gave him appt info. 

## 2022-04-26 NOTE — Telephone Encounter (Signed)
Tried calling patient to schedule scan no answer   Patient needs V/Q scan and duplex schedule per ST thank you

## 2022-04-26 NOTE — Telephone Encounter (Signed)
Tried calling patient again no answer left a vm

## 2022-04-27 ENCOUNTER — Other Ambulatory Visit (HOSPITAL_COMMUNITY): Payer: Self-pay

## 2022-04-29 ENCOUNTER — Other Ambulatory Visit (HOSPITAL_COMMUNITY): Payer: Self-pay

## 2022-04-30 ENCOUNTER — Encounter (HOSPITAL_COMMUNITY): Payer: Self-pay

## 2022-04-30 ENCOUNTER — Encounter (HOSPITAL_COMMUNITY): Payer: 59

## 2022-05-04 ENCOUNTER — Other Ambulatory Visit (HOSPITAL_COMMUNITY): Payer: Self-pay

## 2022-05-05 ENCOUNTER — Other Ambulatory Visit (HOSPITAL_COMMUNITY): Payer: Self-pay

## 2022-05-14 ENCOUNTER — Other Ambulatory Visit (HOSPITAL_COMMUNITY): Payer: Self-pay

## 2022-05-14 MED ORDER — PREDNISONE 20 MG PO TABS
20.0000 mg | ORAL_TABLET | Freq: Every day | ORAL | 0 refills | Status: AC
  Filled 2022-05-14: qty 5, 5d supply, fill #0

## 2022-05-14 MED ORDER — LEVOFLOXACIN 500 MG PO TABS
500.0000 mg | ORAL_TABLET | Freq: Every day | ORAL | 0 refills | Status: AC
  Filled 2022-05-14: qty 5, 5d supply, fill #0

## 2022-05-15 ENCOUNTER — Other Ambulatory Visit (HOSPITAL_COMMUNITY): Payer: Self-pay

## 2022-05-17 ENCOUNTER — Other Ambulatory Visit: Payer: Self-pay | Admitting: Pulmonary Disease

## 2022-05-17 ENCOUNTER — Other Ambulatory Visit (HOSPITAL_COMMUNITY): Payer: Self-pay

## 2022-05-17 MED ORDER — ALBUTEROL SULFATE (2.5 MG/3ML) 0.083% IN NEBU
3.0000 mL | INHALATION_SOLUTION | Freq: Four times a day (QID) | RESPIRATORY_TRACT | 3 refills | Status: DC | PRN
  Filled 2022-05-17: qty 180, 15d supply, fill #0

## 2022-05-18 ENCOUNTER — Other Ambulatory Visit (HOSPITAL_COMMUNITY): Payer: Self-pay

## 2022-05-19 ENCOUNTER — Other Ambulatory Visit (HOSPITAL_COMMUNITY): Payer: Self-pay

## 2022-05-21 ENCOUNTER — Other Ambulatory Visit (HOSPITAL_COMMUNITY): Payer: Self-pay

## 2022-05-26 ENCOUNTER — Other Ambulatory Visit (HOSPITAL_COMMUNITY): Payer: Self-pay

## 2022-05-28 ENCOUNTER — Other Ambulatory Visit (HOSPITAL_COMMUNITY): Payer: Self-pay

## 2022-05-28 ENCOUNTER — Other Ambulatory Visit (HOSPITAL_BASED_OUTPATIENT_CLINIC_OR_DEPARTMENT_OTHER): Payer: Self-pay

## 2022-05-29 ENCOUNTER — Other Ambulatory Visit (HOSPITAL_BASED_OUTPATIENT_CLINIC_OR_DEPARTMENT_OTHER): Payer: Self-pay

## 2022-05-29 ENCOUNTER — Other Ambulatory Visit (HOSPITAL_COMMUNITY): Payer: Self-pay

## 2022-06-02 ENCOUNTER — Other Ambulatory Visit (HOSPITAL_BASED_OUTPATIENT_CLINIC_OR_DEPARTMENT_OTHER): Payer: Self-pay

## 2022-06-02 ENCOUNTER — Other Ambulatory Visit (HOSPITAL_COMMUNITY): Payer: Self-pay

## 2022-06-02 MED ORDER — FLUTICASONE PROPIONATE 50 MCG/ACT NA SUSP
2.0000 | Freq: Every day | NASAL | 3 refills | Status: DC
  Filled 2022-06-02: qty 48, 90d supply, fill #0

## 2022-06-02 MED ORDER — AREXVY 120 MCG/0.5ML IM SUSR
0.5000 mL | Freq: Once | INTRAMUSCULAR | 0 refills | Status: AC
  Filled 2022-06-04: qty 0.5, 1d supply, fill #0

## 2022-06-03 ENCOUNTER — Telehealth: Payer: Self-pay | Admitting: Pulmonary Disease

## 2022-06-03 ENCOUNTER — Other Ambulatory Visit (HOSPITAL_COMMUNITY): Payer: Self-pay

## 2022-06-03 NOTE — Telephone Encounter (Signed)
Attempted to call pt but unable to reach. Left message for him to return call. °

## 2022-06-03 NOTE — Telephone Encounter (Signed)
I think it is ok to wait on the CXR until tomorrow so Dr. Vaughan Browner can exam him. Strict ED precautions. Thanks.

## 2022-06-03 NOTE — Telephone Encounter (Signed)
Spoke with pt who states that he has right sided chest pain in front and back with inhalation. Pt stated this started 2 days ago. Pt denies SOB/ cough/ wheezing/ fever/ chills/ GI upset. Pt instructed if sypmtoms get worse to seek care at a UC or ED. Pt scheduled for Acute visit with Dr. Vaughan Browner tomorrow and is requesting a CXR. Pt wants to know if you would like him to come in today for CXR.  Routing to [facility] for advise and Dr. Vaughan Browner as Juluis Rainier

## 2022-06-04 ENCOUNTER — Ambulatory Visit (INDEPENDENT_AMBULATORY_CARE_PROVIDER_SITE_OTHER): Payer: 59

## 2022-06-04 ENCOUNTER — Ambulatory Visit: Payer: 59 | Admitting: Pulmonary Disease

## 2022-06-04 ENCOUNTER — Other Ambulatory Visit (HOSPITAL_BASED_OUTPATIENT_CLINIC_OR_DEPARTMENT_OTHER): Payer: Self-pay

## 2022-06-04 ENCOUNTER — Ambulatory Visit
Admission: RE | Admit: 2022-06-04 | Discharge: 2022-06-04 | Disposition: A | Discharge status: Home / Self Care | Payer: 59 | Source: Ambulatory Visit | Attending: Pulmonary Disease | Admitting: Pulmonary Disease

## 2022-06-04 ENCOUNTER — Encounter: Payer: Self-pay | Admitting: Pulmonary Disease

## 2022-06-04 ENCOUNTER — Other Ambulatory Visit (HOSPITAL_COMMUNITY): Payer: Self-pay

## 2022-06-04 VITALS — BP 114/64 | HR 98 | Temp 97.5°F | Ht 69.0 in | Wt 185.0 lb

## 2022-06-04 DIAGNOSIS — I2510 Atherosclerotic heart disease of native coronary artery without angina pectoris: Secondary | ICD-10-CM | POA: Diagnosis not present

## 2022-06-04 DIAGNOSIS — R079 Chest pain, unspecified: Secondary | ICD-10-CM

## 2022-06-04 DIAGNOSIS — R918 Other nonspecific abnormal finding of lung field: Secondary | ICD-10-CM | POA: Diagnosis not present

## 2022-06-04 DIAGNOSIS — I700 Atherosclerosis of aorta: Secondary | ICD-10-CM | POA: Diagnosis not present

## 2022-06-04 DIAGNOSIS — I2782 Chronic pulmonary embolism: Secondary | ICD-10-CM

## 2022-06-04 MED ORDER — IOPAMIDOL (ISOVUE-370) INJECTION 76%
75.0000 mL | Freq: Once | INTRAVENOUS | Status: AC | PRN
  Administered 2022-06-04: 75 mL via INTRAVENOUS

## 2022-06-04 MED ORDER — PREDNISONE 20 MG PO TABS
40.0000 mg | ORAL_TABLET | Freq: Every day | ORAL | 0 refills | Status: DC
  Filled 2022-06-04: qty 2, 1d supply, fill #0
  Filled 2022-06-04: qty 8, 4d supply, fill #0

## 2022-06-04 NOTE — Patient Instructions (Signed)
We will get a chest x-ray today We will also order a CT angiogram for evaluation of blood clot. Order prednisone 40 mg a Amorin for 5 days Follow-up with Dr. Elsworth Soho or nurse practitioner in 4 weeks.

## 2022-06-04 NOTE — Progress Notes (Signed)
James [name]    [id number]    02/24/1956  Primary Care Physician:South, Annie Main, MD  Referring Physician: [name], [place],  [place] [address]  Chief complaint: Acute visit for chest pain  HPI: 66 y.o. who  has a past medical history of ANXIETY (04/03/2007), Anxiety, ASTHMA (09/06/2008), Asthma, ASTHMATIC BRONCHITIS, ACUTE (10/25/2008), Bladder neck obstruction, CARPAL TUNNEL SYNDROME, BILATERAL (07/31/2007), Cervical disc disease, Chronic bronchitis ([place]), COPD (chronic obstructive pulmonary disease) ([place]), CORONARY ARTERY DISEASE (04/03/2007), DEPRESSION (04/03/2007), Depression, DIABETES MELLITUS, TYPE I (04/03/2007), Diabetic retinopathy associated with diabetes mellitus due to underlying condition ([place]) (04/03/2007), DM W/EYE MANIFESTATIONS, TYPE I, UNCONTROLLED (04/04/2007), DM W/RENAL MNFST, TYPE I, UNCONTROLLED (04/04/2007), ED (erectile dysfunction), History of kidney stones, HYPERLIPIDEMIA (04/04/2007), Pneumonia, Renal insufficiency, Seizures ([place]), and Spinal stenosis.   Complains of acute right chest pain for the past 1 week.  Pain started at the back and now travels to the front.  No associated precipitating or relieving factors. He had asthma exacerbation, pneumonia 3 to 4 weeks ago and was treated with levofloxacin by his primary care  History notable for elevated D-dimer which is done as a work-up for unexplained dyspnea.  Dr. Silverio Lay from cardiology had ordered VQ scan and lower extremity duplex but was not done by the patient as he felt that the co-pay was too high.   Outpatient Encounter Medications as of 06/04/2022  Medication Sig   acetaminophen (TYLENOL) 500 MG tablet Take 1,000 mg by mouth every 6 (six) hours as needed for mild pain or headache.   albuterol (PROVENTIL) (2.5 MG/3ML) 0.083% nebulizer solution Take 3 mLs by nebulization every 6 (six) hours as needed for wheezing or shortness of breath.   aspirin EC 81 MG tablet Take 1 tablet (81 mg  total) by mouth daily. Swallow whole. (Patient taking differently: Take 81 mg by mouth at bedtime. Swallow whole.)   cetirizine (ZYRTEC) 10 MG tablet Take 10 mg by mouth daily.   Continuous Blood Gluc Sensor (FREESTYLE LIBRE 3 SENSOR) MISC Change sensor every 14 days to monitor blood glucose continously   fluticasone (FLONASE) 50 MCG/ACT nasal spray Place 2 sprays into both nostrils daily.   gabapentin (NEURONTIN) 300 MG capsule Take 1 capsule by mouth twice daily as needed   glucose blood (CONTOUR NEXT TEST) test strip USE 8 TIMES DAILY AS DIRECTED TO MONITOR BLOOD GLUCOSE   ibuprofen (ADVIL) 200 MG tablet Take 800 mg by mouth every 8 (eight) hours as needed (pain.).   insulin glargine-yfgn (SEMGLEE, YFGN,) 100 UNIT/ML Pen Inject 38 units subcutaneously daily as directed   insulin lispro (HUMALOG KWIKPEN) 100 UNIT/ML KwikPen INJECT UP TO 100 UNITS A Gianfrancesco UNDER THE SKIN AS DIRECTED   Insulin Pen Needle 31G X 8 MM MISC Use 1 pen needle subcutaneously 4 - 5 times daily as directed.   levothyroxine (SYNTHROID) 100 MCG tablet Take 1 tablet (100 mcg total) by mouth daily.   metoCLOPramide (REGLAN) 5 MG tablet Take 1 tablet (5 mg total) by mouth 2 (two) times daily before meals   pantoprazole (PROTONIX) 40 MG tablet Take 1 tablet by mouth daily   RSV vaccine recomb adjuvanted (AREXVY) 120 MCG/0.5ML injection Inject 0.5 mLs into the muscle once for 1 dose.   simvastatin (ZOCOR) 40 MG tablet TAKE 1 TABLET BY MOUTH ONCE DAILY   tamsulosin (FLOMAX) 0.4 MG CAPS capsule Take 1 capsule by mouth daily   traZODone (DESYREL) 150 MG tablet Take 1 tablet (150 mg  total) by mouth at bedtime as needed for sleep.   varenicline (CHANTIX) 1 MG tablet Take 1 tablet (1 mg total) by mouth 2 (two) times daily.   albuterol (VENTOLIN HFA) 108 (90 Base) MCG/ACT inhaler INHALE 2 PUFFS BY MOUTH INTO THE LUNGS EVERY 6 HOURS AS NEEDED FOR WHEEZING OR SHORTNESS OF BREATH.   Glucagon, rDNA, (GLUCAGON EMERGENCY) 1 MG KIT USE AS NEEDED  FOR SEVERE HYPOGLYCEMIA   metoCLOPramide (REGLAN) 5 MG tablet TAKE 1 TABLET BY MOUTH TWICE DAILY BEFORE A MEAL.   [DISCONTINUED] azelastine (ASTELIN) 0.1 % nasal spray Place 2 sprays into both nostrils 2 (two) times daily   [DISCONTINUED] fluticasone (FLONASE) 50 MCG/ACT nasal spray Place 2 sprays into both nostrils daily.   [DISCONTINUED] insulin glargine (LANTUS) 100 UNIT/ML injection Inject 0.2 mLs (20 Units total) into the skin at bedtime. (Patient not taking: Reported on 07/17/2019)   [DISCONTINUED] varenicline (CHANTIX PAK) 0.5 MG X 11 & 1 MG X 42 tablet Take 0.633m every morning for 3 days, 0.573mmorning and evening for 3 days, 33m75mn the morning and evening.   No facility-administered encounter medications on file as of 06/04/2022.    Allergies as of 06/04/2022 - Review Complete 06/04/2022  Allergen Reaction Noted   Augmentin [amoxicillin-pot clavulanate] Nausea And Vomiting and Other (See Comments) 01/21/2016   Lexapro [escitalopram oxalate] Itching 04/11/2013    Past Medical History:  Diagnosis Date   ANXIETY 04/03/2007   Anxiety    ASTHMA 09/06/2008   Asthma    ASTHMATIC BRONCHITIS, ACUTE 10/25/2008   Bladder neck obstruction    CARPAL TUNNEL SYNDROME, BILATERAL 07/31/2007   issues resolved, no surgery   Cervical disc disease    [facility] [place]itis ([facility]  "get it about q yr" (02/12/2014)   COPD [facility]ch[facility] obstructive pulmonary disease) ([facility]  CORONARY ARTERY DISEASE 04/03/2007   DEPRESSION 04/03/2007   Depression    DIABETES MELLITUS, TYPE I 04/03/2007   Diabetic retinopathy associated with diabetes mellitus due to underlying condition ([facility]/15/2008   DM W/EYE MANIFESTATIONS, TYPE I, UNCONTROLLED 04/04/2007   DM W/RENAL MNFST, TYPE I, UNCONTROLLED 04/04/2007   ED (erectile dysfunction)    History of kidney stones    HYPERLIPIDEMIA 04/04/2007   Pneumonia    "several times and again today" (02/13/2104)   Renal insufficiency    Seizures ([facility]  "insulin seizure from time to  time; none in the last couple years" (02/12/2014)   Spinal stenosis     Past Surgical History:  Procedure Laterality Date   ANTERIOR CERVICAL DECOMP/DISCECTOMY FUSION  2000   "couple screws and a plate"   ANTERIOR CERVICAL DECOMP/DISCECTOMY FUSION N/A 06/24/2016   Procedure: ANTERIOR CERVICAL DECOMPRESSION/DISCECTOMY FUSION CERVICAL FOUR - CERVICAL FIVE, CERVICAL FIVE - CERVICAL SIX; REMOVAL TETHER CERVICAL PLATE;  Surgeon: RobJovita GammaD;  [facility];  Laterality: N/A;  ANTERIOR CERVICAL DECOMPRESSION/DISCECTOMY FUSION CERVICAL FOUR - CERVICAL FIVE, CERVICAL FIVE - CERVICAL SIX; REMOVAL TETHER CERVICAL PLATE   APPENDECTOMY     BACK SURGERY     CARDIAC CATHETERIZATION  1990's   CATARACT EXTRACTION W/ INTRAOCULAR LENS  IMPLANT, BILATERAL Bilateral    CYSTOSCOPY WITH RETROGRADE PYELOGRAM, URETEROSCOPY AND STENT PLACEMENT Bilateral 04/06/2013   Procedure: BILATERAL CYSTOSCOPY WITH RETROGRADE PYELOGRAMS, STENT PLACEMENTS AND LEFT URETEROSCOPY AND STONE REMOVAL;  Surgeon: TheAlexis FrockD;  Location: [facility];  Service: Urology;  Laterality: Bilateral;   CYSTOSCOPY WITH STENT PLACEMENT Right 04/12/2013   Procedure: CYSTOSCOPY WITH STENT PLACEMENT;  Surgeon: Alexis Frock, MD;  Location: [facility];  Service: Urology;  Laterality: Right;   CYSTOSCOPY/RETROGRADE/URETEROSCOPY Bilateral 04/12/2013   Procedure: CYSTOSCOPY/RETROGRADE/URETEROSCOPY;  Surgeon: Alexis Frock, MD;  Location: [facility];  Service: Urology;  Laterality: Bilateral;  RIGHT RETROGRADE    HOLMIUM LASER APPLICATION Left 11/12/8339   Procedure: HOLMIUM LASER APPLICATION;  Surgeon: Alexis Frock, MD;  Location: [facility];  Service: Urology;  Laterality: Left;   LEFT HEART CATH AND CORONARY ANGIOGRAPHY N/A 04/29/2020   Procedure: LEFT HEART CATH AND CORONARY ANGIOGRAPHY;  Surgeon: Nigel Mormon, MD;  Location: [facility];  Service: Cardiovascular;  Laterality: N/A;   LUMBAR LAMINECTOMY/DECOMPRESSION  MICRODISCECTOMY N/A 04/20/2019   Procedure: Lumbar microdisectomy and decompression L5-S1 left;  Surgeon: Latanya Maudlin, MD;  Location: [facility];  Service: Orthopedics;  Laterality: N/A;  39mn   LYMPH NODE DISSECTION  ~ 1960   groin   stress cardiolite  09/06/2002   TONSILLECTOMY     VITRECTOMY Bilateral     Family History  Problem Relation Age of Onset   Stroke Father        strong FH cerebrovascular disease   Diabetes Brother    Heart disease Brother        CHF   Cancer Mother    Colon cancer Neg Hx     Social History   Socioeconomic History   Marital status: Married    Spouse name: Not on file   Number of children: 2   Years of education: Not on file   Highest education level: Not on file  Occupational History    Employer: UNEMPLOYED    Comment: owns a lBiomedical scientistbusiness  Tobacco Use   Smoking status: Former    Packs/[place]: 0.00    Types: Cigarettes    Quit date: 08/20/2019    Years since quitting: 2.7   Smokeless tobacco: Never   Tobacco comments:    Successfully quit smoking 08/20/2019  Vaping Use   Vaping Use: Never used  Substance and Sexual Activity   Alcohol use: No   Drug use: No   Sexual activity: Not Currently  Other Topics Concern   Not on file  Social History Narrative   Insurance coverage for [employer]   Social Determinants of Health   Financial Resource Strain: Not on file  Food Insecurity: Not on file  Transportation Needs: Not on file  Physical Activity: Not on file  Stress: Not on file  Social Connections: Not on file  Intimate Partner Violence: Not on file    Review of systems: Review of Systems  Constitutional: Negative for fever and chills.  HENT: Negative.   Eyes: Negative for blurred vision.  Respiratory: as per HPI  Cardiovascular: Negative for chest pain and palpitations.  Gastrointestinal: Negative for vomiting, diarrhea, blood per rectum. Genitourinary: Negative for dysuria, urgency, frequency and hematuria.  Musculoskeletal:  Negative for myalgias, back pain and joint pain.  Skin: Negative for itching and rash.  Neurological: Negative for dizziness, tremors, focal weakness, seizures and loss of consciousness.  Endo/Heme/Allergies: Negative for environmental allergies.  Psychiatric/Behavioral: Negative for depression, suicidal ideas and hallucinations.  All other systems reviewed and are negative.  Physical Exam: Blood pressure 114/64, pulse 98, temperature (!) 97.5 F (36.4 C), temperature source Oral, height _0  (1.753 m), weight 185 lb (83.9 kg), SpO2 97 %. Gen:      No acute distress HEENT:  EOMI, sclera anicteric Neck:     No masses; no thyromegaly Lungs:    Clear to auscultation  bilaterally; normal respiratory effort CV:         Regular rate and rhythm; no murmurs Abd:      + bowel sounds; soft, non-tender; no palpable masses, no distension Ext:    No edema; adequate peripheral perfusion Skin:      Warm and dry; no rash Neuro: alert and oriented x 3 Psych: normal mood and affect  Data Reviewed: Imaging: Screening CT chest 07/16/2021-mild emphysema with bronchial wall thickening. Chest x-ray 11/05/2021-lower lobe subsegmental atelectasis.  No acute cardiopulmonary disease I reviewed images personally.  PFTs:  Labs: Labs from primary care Collected: 04/13/2022 D-dimer 1.05 mg/L Hemoglobin 14.4 g/dL, hematocrit 41.8%. NT proBNP 72. BUN 18, creatinine 0.94. Sodium 140, potassium 4.3, chloride 100, bicarb 24. eGFR 89 A1c 6.8  Assessment:  Assessment for atypical right chest pain He was recently treated for pneumonia with levofloxacin.  We will get a chest x-ray today for better evaluation and prescribed prednisone 40 mg a [place] for 5 days as he has symptoms of asthma exacerbation with wheezing  I am also concerned about his elevated D-dimer especially with presentation of chest pain.  VQ scan had previously been ordered but patient did not go through with that.  Discussed in detail with patient  and recommended CT angiogram PE study but the patient wants to find out about the cost before proceeding.  Plan/Recommendations: Chest x-ray Prednisone for 5 days CT angiogram PE study  Marshell Garfinkel MD [facility] 06/04/2022, 11:13 AM  CC: [name], MD

## 2022-06-08 ENCOUNTER — Other Ambulatory Visit (HOSPITAL_COMMUNITY): Payer: Self-pay

## 2022-06-09 ENCOUNTER — Other Ambulatory Visit: Payer: Self-pay | Admitting: *Deleted

## 2022-06-09 DIAGNOSIS — J690 Pneumonitis due to inhalation of food and vomit: Secondary | ICD-10-CM

## 2022-06-12 ENCOUNTER — Other Ambulatory Visit (HOSPITAL_COMMUNITY): Payer: Self-pay

## 2022-06-12 ENCOUNTER — Encounter (HOSPITAL_COMMUNITY): Payer: Self-pay | Admitting: Emergency Medicine

## 2022-06-12 ENCOUNTER — Ambulatory Visit (HOSPITAL_COMMUNITY)
Admission: EM | Admit: 2022-06-12 | Discharge: 2022-06-12 | Disposition: A | Discharge status: Home / Self Care | Payer: 59 | Attending: Internal Medicine | Admitting: Internal Medicine

## 2022-06-12 DIAGNOSIS — K0889 Other specified disorders of teeth and supporting structures: Secondary | ICD-10-CM | POA: Diagnosis not present

## 2022-06-12 DIAGNOSIS — S025XXA Fracture of tooth (traumatic), initial encounter for closed fracture: Secondary | ICD-10-CM

## 2022-06-12 MED ORDER — HYDROCODONE-ACETAMINOPHEN 5-325 MG PO TABS
1.0000 | ORAL_TABLET | Freq: Four times a day (QID) | ORAL | 0 refills | Status: AC | PRN
  Filled 2022-06-12: qty 12, 3d supply, fill #0

## 2022-06-12 MED ORDER — ONDANSETRON 4 MG PO TBDP
4.0000 mg | ORAL_TABLET | Freq: Three times a day (TID) | ORAL | 0 refills | Status: DC | PRN
  Filled 2022-06-12: qty 20, 7d supply, fill #0

## 2022-06-12 MED ORDER — AMOXICILLIN-POT CLAVULANATE 875-125 MG PO TABS
1.0000 | ORAL_TABLET | Freq: Two times a day (BID) | ORAL | 0 refills | Status: DC
  Filled 2022-06-12: qty 14, 7d supply, fill #0

## 2022-06-12 NOTE — Discharge Instructions (Signed)
Start taking Augmentin antibiotic 2 times a [place] for the next 7 days with food.  If you experiencing any nausea or vomiting while taking Augmentin, you may take Zofran every 8 hours as needed for nausea and vomiting.  You may take ibuprofen (Advil) 600 mg every 6 hours as needed for dental pain with food.  You may also take regular strength Tylenol 650 mg every 6 hours as needed for dental pain as well.  If your pain is not well-controlled with ibuprofen and Tylenol, you may take hydrocodone/acetaminophen (Norco/Vicodin) 1 tablet every 6 hours as needed for severe pain. Once your pain is well-controlled with the Norco Vicodin (strong pain medicine) switch back to using ibuprofen and Tylenol.  Follow-up with your orthodontic surgeon as scheduled on Monday morning.   If you develop any new or worsening symptoms or do not improve in the next 2 to 3 days, please return.  If your symptoms are severe, please go to the emergency room.  Follow-up with your primary care provider for further evaluation and management of your symptoms as well as ongoing wellness visits.  I hope you feel better!

## 2022-06-12 NOTE — ED Triage Notes (Signed)
Pt reports right lower dental pain after cracking a tooth last night. States he is unable to see an Chief Financial Officer until Monday.

## 2022-06-12 NOTE — ED Provider Notes (Signed)
[place]    CSN: [id number] Arrival date & time: 06/12/22  1034      History   Chief Complaint Chief Complaint  Patient presents with   Dental Pain    HPI James [name] is a 66 y.o. male.   Patient presents urgent care for evaluation of broken tooth to the right lower side.  When he was eating dinner last night, he recalls chewing on something "hard" and he realized that he has been chewing on his chipped off tooth.  He was able to call his dental surgeon and get an appointment for Monday morning but is experiencing a lot of pain related to tooth fracture.  Pain to the right lower side of the mouth is currently a 9 on a scale of 0-10 and has not responded well to ibuprofen or Tylenol prior to arrival urgent care.  He denies fever/chills, nausea, vomiting, swelling to the mouth, and dizziness.  He is experiencing significant cold sensitivity to the tooth with eating.  He is a type I diabetic and states this is well-controlled but does increase his risk for infection.  Denies recent antibiotic use.     Past Medical History:  Diagnosis Date   ANXIETY 04/03/2007   Anxiety    ASTHMA 09/06/2008   Asthma    ASTHMATIC BRONCHITIS, ACUTE 10/25/2008   Bladder neck obstruction    CARPAL TUNNEL SYNDROME, BILATERAL 07/31/2007   issues resolved, no surgery   Cervical disc disease    Chronic bronchitis ([place])    "get it about q yr" (02/12/2014)   COPD (chronic obstructive pulmonary disease) ([place])    CORONARY ARTERY DISEASE 04/03/2007   DEPRESSION 04/03/2007   Depression    DIABETES MELLITUS, TYPE I 04/03/2007   Diabetic retinopathy associated with diabetes mellitus due to underlying condition ([place]) 04/03/2007   DM W/EYE MANIFESTATIONS, TYPE I, UNCONTROLLED 04/04/2007   DM W/RENAL MNFST, TYPE I, UNCONTROLLED 04/04/2007   ED (erectile dysfunction)    History of kidney stones    HYPERLIPIDEMIA 04/04/2007   Pneumonia    "several times and again today" (02/13/2104)   Renal  insufficiency    Seizures ([place])    "insulin seizure from time to time; none in the last couple years" (02/12/2014)   Spinal stenosis     Patient Active Problem List   Diagnosis Date Noted   Autoimmune thyroiditis 12/10/2020   Benign prostatic hyperplasia with lower urinary tract symptoms 56/25/6389   Diastolic dysfunction 37/34/2876   Occlusion and stenosis of bilateral carotid arteries 12/10/2020   Bronchiectasis with (acute) exacerbation ([place]) 12/06/2019   Pneumonia 12/01/2019   Medication management 11/09/2019   Seizure disorder, secondary ([place]) 07/19/2019   Abnormal CT of the chest 07/19/2019   Acute asthma exacerbation 06/29/2019   Asthma exacerbation 06/28/2019   Spinal stenosis, 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   Hypoglycemia due to insulin 04/20/2019   DM type 1 (diabetes mellitus, type 1) ([place]) 04/20/2019   Anemia 01/05/2019   Long term (current) use of insulin ([place]) 07/07/2017   HNP (herniated nucleus pulposus), cervical 06/24/2016   Syncope 05/20/2016   Tobacco abuse 05/20/2016   Cervical disc disorder with radiculopathy of cervical region 05/20/2016   Major depression, single episode 02/12/2015   Aspiration pneumonia ([place]) 02/12/2014   Sleep disorder 10/30/2012   Peripheral vascular disease ([place]) 06/25/2011   Gastroparesis 02/10/2011   Esophageal reflux 12/21/2010   TOBACCO USE, QUIT 05/08/2010   CONTUSION, RIGHT CHEST WALL 04/07/2010   Chronic asthmatic  bronchitis 10/25/2008   Asthma, persistent controlled 09/06/2008   Cough 09/06/2008   SHOULDER PAIN, RIGHT 08/19/2008   BLADDER OUTLET OBSTRUCTION 04/15/2008   RASH AND OTHER NONSPECIFIC SKIN ERUPTION 02/21/2008   CARPAL TUNNEL SYNDROME, BILATERAL 07/31/2007   Allergic rhinitis 07/31/2007   RENAL INSUFFICIENCY 07/31/2007   OTHER TENOSYNOVITIS OF HAND AND WRIST 07/31/2007   DM W/RENAL MNFST, TYPE I, UNCONTROLLED 04/04/2007   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,  uncontrolled, with macular edema, 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/04/2007   HLD (hyperlipidemia) 04/04/2007   DIABETIC RETINOPATHY, PROLIFERATIVE 04/03/2007   ANXIETY 04/03/2007   DEPRESSION 04/03/2007   TINNITUS 04/03/2007   CAD (coronary artery disease) 04/03/2007   DYSHIDROSIS 04/03/2007    Past Surgical History:  Procedure Laterality Date   ANTERIOR CERVICAL DECOMP/DISCECTOMY FUSION  2000   "couple screws and a plate"   ANTERIOR CERVICAL DECOMP/DISCECTOMY FUSION N/A 06/24/2016   Procedure: ANTERIOR CERVICAL DECOMPRESSION/DISCECTOMY FUSION CERVICAL FOUR - CERVICAL FIVE, CERVICAL FIVE - CERVICAL SIX; REMOVAL TETHER CERVICAL PLATE;  Surgeon: Jovita Gamma, MD;  Location: [facility];  Service: Neurosurgery;  Laterality: N/A;  ANTERIOR CERVICAL DECOMPRESSION/DISCECTOMY FUSION CERVICAL FOUR - CERVICAL FIVE, CERVICAL FIVE - CERVICAL SIX; REMOVAL TETHER CERVICAL PLATE   APPENDECTOMY     BACK SURGERY     CARDIAC CATHETERIZATION  1990's   CATARACT EXTRACTION W/ INTRAOCULAR LENS  IMPLANT, BILATERAL Bilateral    CYSTOSCOPY WITH RETROGRADE PYELOGRAM, URETEROSCOPY AND STENT PLACEMENT Bilateral 04/06/2013   Procedure: BILATERAL CYSTOSCOPY WITH RETROGRADE PYELOGRAMS, STENT PLACEMENTS AND LEFT URETEROSCOPY AND STONE REMOVAL;  Surgeon: Alexis Frock, MD;  Location: [facility];  Service: Urology;  Laterality: Bilateral;   CYSTOSCOPY WITH STENT PLACEMENT Right 04/12/2013   Procedure: CYSTOSCOPY WITH STENT PLACEMENT;  Surgeon: Alexis Frock, MD;  Location: [facility];  Service: Urology;  Laterality: Right;   CYSTOSCOPY/RETROGRADE/URETEROSCOPY Bilateral 04/12/2013   Procedure: CYSTOSCOPY/RETROGRADE/URETEROSCOPY;  Surgeon: Alexis Frock, MD;  Location: [facility];  Service: Urology;  Laterality: Bilateral;  RIGHT RETROGRADE    HOLMIUM LASER APPLICATION Left 4/56/2563   Procedure: HOLMIUM LASER APPLICATION;  Surgeon: Alexis Frock, MD;  Location: [facility];  Service: Urology;  Laterality: Left;   LEFT HEART CATH AND CORONARY ANGIOGRAPHY  N/A 04/29/2020   Procedure: LEFT HEART CATH AND CORONARY ANGIOGRAPHY;  Surgeon: Nigel Mormon, MD;  Location: [facility];  Service: Cardiovascular;  Laterality: N/A;   LUMBAR LAMINECTOMY/DECOMPRESSION MICRODISCECTOMY N/A 04/20/2019   Procedure: Lumbar microdisectomy and decompression L5-S1 left;  Surgeon: Latanya Maudlin, MD;  Location: [facility];  Service: Orthopedics;  Laterality: N/A;  43mn   LYMPH NODE DISSECTION  ~ 1960   groin   stress cardiolite  09/06/2002   TONSILLECTOMY     VITRECTOMY Bilateral        Home Medications    Prior to Admission medications   Medication Sig Start Date End Date Taking? Authorizing Provider  amoxicillin-clavulanate (AUGMENTIN) 875-125 MG tablet Take 1 tablet by mouth every 12 (twelve) hours. 06/12/22  Yes STalbot Grumbling FNP  HYDROcodone-acetaminophen (NORCO/VICODIN) 5-325 MG tablet Take 1 tablet by mouth every 6 (six) hours as needed for up to 3 days for severe pain or moderate pain. 06/12/22 06/15/22 Yes Malvika Tung, CStasia Cavalier FNP  ondansetron (ZOFRAN-ODT) 4 MG disintegrating tablet Take 1 tablet (4 mg total) by mouth every 8 (eight) hours as needed for nausea or vomiting. 06/12/22  Yes STalbot Grumbling FNP  acetaminophen (TYLENOL) 500 MG tablet Take 1,000 mg by mouth every 6 (six) hours as needed for mild pain or headache.  [provider]  albuterol (PROVENTIL) (2.5 MG/3ML) 0.083% nebulizer solution Take 3 mLs by nebulization every 6 (six) hours as needed for wheezing or shortness of breath. 05/17/22   Rigoberto Noel, MD  albuterol (VENTOLIN HFA) 108 (90 Base) MCG/ACT inhaler INHALE 2 PUFFS BY MOUTH INTO THE LUNGS EVERY 6 HOURS AS NEEDED FOR WHEEZING OR SHORTNESS OF BREATH. 06/20/20 03/26/22  Parrett, Fonnie Mu, NP  aspirin EC 81 MG tablet Take 1 tablet (81 mg total) by mouth daily. Swallow whole. Patient taking differently: Take 81 mg by mouth at bedtime. Swallow whole. 04/16/20   Tolia, Sunit, DO  cetirizine (ZYRTEC) 10  MG tablet Take 10 mg by mouth daily.    [provider]  Continuous Blood Gluc Sensor (FREESTYLE LIBRE 3 SENSOR) MISC Change sensor every 14 days to monitor blood glucose continously 04/08/22     fluticasone (FLONASE) 50 MCG/ACT nasal spray Place 2 sprays into both nostrils daily. 06/02/22     gabapentin (NEURONTIN) 300 MG capsule Take 1 capsule by mouth twice daily as needed 02/03/22     Glucagon, rDNA, (GLUCAGON EMERGENCY) 1 MG KIT USE AS NEEDED FOR SEVERE HYPOGLYCEMIA 09/25/20 03/26/22  [name], MD  glucose blood (CONTOUR NEXT TEST) test strip USE 8 TIMES DAILY AS DIRECTED TO MONITOR BLOOD GLUCOSE 04/23/22     ibuprofen (ADVIL) 200 MG tablet Take 800 mg by mouth every 8 (eight) hours as needed (pain.).    [provider]  insulin glargine-yfgn (SEMGLEE, YFGN,) 100 UNIT/ML Pen Inject 38 units subcutaneously daily as directed 04/12/22     insulin lispro (HUMALOG KWIKPEN) 100 UNIT/ML KwikPen INJECT UP TO 100 UNITS A [place] UNDER THE SKIN AS DIRECTED 02/19/22     Insulin Pen Needle 31G X 8 MM MISC Use 1 pen needle subcutaneously 4 - 5 times daily as directed. 09/07/21     levothyroxine (SYNTHROID) 100 MCG tablet Take 1 tablet (100 mcg total) by mouth daily. 02/27/22     metoCLOPramide (REGLAN) 5 MG tablet TAKE 1 TABLET BY MOUTH TWICE DAILY BEFORE A MEAL. 10/09/20 11/29/21  [name], MD  metoCLOPramide (REGLAN) 5 MG tablet Take 1 tablet (5 mg total) by mouth 2 (two) times daily before meals 04/08/22     pantoprazole (PROTONIX) 40 MG tablet Take 1 tablet by mouth daily 12/07/21     predniSONE (DELTASONE) 20 MG tablet Take 2 tablets (40 mg total) by mouth daily for 5 days 06/04/22   Mannam, Hart Robinsons, MD  simvastatin (ZOCOR) 40 MG tablet TAKE 1 TABLET BY MOUTH ONCE DAILY 07/24/21     tamsulosin (FLOMAX) 0.4 MG CAPS capsule Take 1 capsule by mouth daily 02/23/22     traZODone (DESYREL) 150 MG tablet Take 1 tablet (150 mg total) by mouth at bedtime as needed for sleep. 02/27/22     varenicline  (CHANTIX) 1 MG tablet Take 1 tablet (1 mg total) by mouth 2 (two) times daily. 04/12/22     insulin glargine (LANTUS) 100 UNIT/ML injection Inject 0.2 mLs (20 Units total) into the skin at bedtime. Patient not taking: Reported on 07/17/2019 07/08/19 08/13/19  Donne Hazel, MD    Family History Family History  Problem Relation Age of Onset   Stroke Father        strong FH cerebrovascular disease   Diabetes Brother    Heart disease Brother        CHF   Cancer Mother    Colon cancer Neg Hx     Social History Social  History   Tobacco Use   Smoking status: Former    Packs/[place]iel[place]: 0.00    Types: Cigarettes    Quit date: 08/20/2019    Years since quitting: 2.8   Smokeless tobacco: Never   Tobacco comments:    Successfully quit smoking 08/20/2019  Vaping Use   Vaping Use: Never used  Substance Use Topics   Alcohol use: No   Drug use: No     Allergies   Augmentin [amoxicillin-pot clavulanate] and Lexapro [escitalopram oxalate]   Review of Systems Review of Systems Per HPI  Physical Exam Triage Vital Signs ED Triage Vitals [06/12/22 1105]  Enc Vitals Group     BP (!) 144/85     Pulse Rate 86     Resp 17     Temp 97.8 F (36.6 C)     Temp Source Oral     SpO2 95 %     Weight      Height      Head Circumference      Peak Flow      Pain Score 8     Pain Loc      Pain Edu?      Excl. in [place]?    No data found.  Updated Vital Signs BP (!) 144/85 (BP Location: Left Arm)   Pulse 86   Temp 97.8 F (36.6 C) (Oral)   Resp 17   SpO2 95%   Visual Acuity Right Eye Distance:   Left Eye Distance:   Bilateral Distance:    Right Eye Near:   Left Eye Near:    Bilateral Near:     Physical Exam Vitals and nursing note reviewed.  Constitutional:      Appearance: He is not ill-appearing or toxic-appearing.  HENT:     Head: Normocephalic and atraumatic.     Right Ear: Hearing and external ear normal.     Left Ear: Hearing and external ear normal.     Nose: Nose  normal.     Mouth/Throat:     Lips: Pink.     Mouth: Mucous membranes are moist.     Dentition: Abnormal dentition. Dental tenderness and dental caries present. No gingival swelling or dental abscesses.     Pharynx: No oropharyngeal exudate or posterior oropharyngeal erythema.     Comments: Generalized dentition abnormal with generalized decay.  Tooth 27 is acutely fractured.  No bleeding or drainage present.  See image below for further detail. Eyes:     General: Lids are normal. Vision grossly intact. Gaze aligned appropriately.     Extraocular Movements: Extraocular movements intact.     Conjunctiva/sclera: Conjunctivae normal.  Cardiovascular:     Rate and Rhythm: Normal rate and regular rhythm.     Heart sounds: Normal heart sounds, S1 normal and S2 normal.  Pulmonary:     Effort: No respiratory distress.     Breath sounds: Normal air entry.  Musculoskeletal:     Cervical back: Neck supple.  Skin:    General: Skin is warm and dry.     Capillary Refill: Capillary refill takes less than 2 seconds.     Findings: No rash.  Neurological:     General: No focal deficit present.     Mental Status: He is alert and oriented to person, place, and time. Mental status is at baseline.     Cranial Nerves: No dysarthria or facial asymmetry.  Psychiatric:        Mood and Affect: Mood normal.  Speech: Speech normal.        Behavior: Behavior normal.        Thought Content: Thought content normal.        Judgment: Judgment normal.      UC Treatments / Results  Labs (all labs ordered are listed, but only abnormal results are displayed) Labs Reviewed - No data to display  EKG   Radiology No results found.  Procedures Procedures (including critical care time)  Medications Ordered in UC Medications - No data to display  Initial Impression / Assessment and Plan / UC Course  I have reviewed the triage vital signs and the nursing notes.  Pertinent labs & imaging results that  were available during my care of the patient were reviewed by me and considered in my medical decision making (see chart for details).   1.  Closed fracture of tooth and dental pain Augmentin twice daily for the next 7 days with food prescribed.  Patient has a previous intolerance to Augmentin due to nausea and vomiting but he states this was many years ago and would like to try taking this antibiotic again to cover for infection related to acute tooth fracture.  Zofran sent to pharmacy to be taken as needed for nausea and vomiting associated with antibiotic use.  He is to take antibiotic with food to prevent this. May take Advil 600 mg every 6 hours as needed and Tylenol 650 mg every 6 hours as needed for dental pain.  If dental pain is not well-controlled with over-the-counter medications, he may take hydrocodone/acetaminophen 1 tablet every 6 hours as needed.  He is to follow-up with dental surgeon as scheduled on Monday morning.  Strict ER and urgent care return precautions discussed.  Patient is agreeable with this plan.  Discussed physical exam and available lab work findings in clinic with patient.  Counseled patient regarding appropriate use of medications and potential side effects for all medications recommended or prescribed today. Discussed red flag signs and symptoms of worsening condition,when to call the PCP office, return to urgent care, and when to seek higher level of care in the emergency department. Patient verbalizes understanding and agreement with plan. All questions answered. Patient discharged in stable condition.    Final Clinical Impressions(s) / UC Diagnoses   Final diagnoses:  Closed fracture of tooth, initial encounter  Pain, dental     Discharge Instructions      Start taking Augmentin antibiotic 2 times a [place] for the next 7 days with food.  If you experiencing any nausea or vomiting while taking Augmentin, you may take Zofran every 8 hours as needed for nausea  and vomiting.  You may take ibuprofen (Advil) 600 mg every 6 hours as needed for dental pain with food.  You may also take regular strength Tylenol 650 mg every 6 hours as needed for dental pain as well.  If your pain is not well-controlled with ibuprofen and Tylenol, you may take hydrocodone/acetaminophen (Norco/Vicodin) 1 tablet every 6 hours as needed for severe pain. Once your pain is well-controlled with the Norco Vicodin (strong pain medicine) switch back to using ibuprofen and Tylenol.  Follow-up with your orthodontic surgeon as scheduled on Monday morning.   If you develop any new or worsening symptoms or do not improve in the next 2 to 3 days, please return.  If your symptoms are severe, please go to the emergency room.  Follow-up with your primary care provider for further evaluation and management of your symptoms  as well as ongoing wellness visits.  I hope you feel better!    ED Prescriptions     Medication Sig Dispense Auth. Provider   amoxicillin-clavulanate (AUGMENTIN) 875-125 MG tablet Take 1 tablet by mouth every 12 (twelve) hours. 14 tablet Talbot Grumbling, FNP   HYDROcodone-acetaminophen (NORCO/VICODIN) 5-325 MG tablet Take 1 tablet by mouth every 6 (six) hours as needed for up to 3 days for severe pain or moderate pain. 12 tablet Joella Prince M, FNP   ondansetron (ZOFRAN-ODT) 4 MG disintegrating tablet Take 1 tablet (4 mg total) by mouth every 8 (eight) hours as needed for nausea or vomiting. 20 tablet Talbot Grumbling, FNP      I have reviewed the PDMP during this encounter.   Talbot Grumbling, [place] 06/12/22 1213

## 2022-06-14 ENCOUNTER — Other Ambulatory Visit (HOSPITAL_COMMUNITY): Payer: Self-pay

## 2022-06-14 MED ORDER — HYDROCODONE-ACETAMINOPHEN 5-325 MG PO TABS
1.0000 | ORAL_TABLET | ORAL | 0 refills | Status: DC | PRN
  Filled 2022-06-14: qty 14, 3d supply, fill #0

## 2022-06-14 MED ORDER — PENICILLIN V POTASSIUM 500 MG PO TABS
500.0000 mg | ORAL_TABLET | Freq: Four times a day (QID) | ORAL | 0 refills | Status: DC
  Filled 2022-06-14: qty 28, 7d supply, fill #0

## 2022-06-15 ENCOUNTER — Other Ambulatory Visit (HOSPITAL_COMMUNITY): Payer: Self-pay

## 2022-06-17 ENCOUNTER — Other Ambulatory Visit (HOSPITAL_COMMUNITY): Payer: Self-pay

## 2022-06-17 DIAGNOSIS — Z794 Long term (current) use of insulin: Secondary | ICD-10-CM | POA: Diagnosis not present

## 2022-06-17 DIAGNOSIS — I2510 Atherosclerotic heart disease of native coronary artery without angina pectoris: Secondary | ICD-10-CM | POA: Diagnosis not present

## 2022-06-17 DIAGNOSIS — E103593 Type 1 diabetes mellitus with proliferative diabetic retinopathy without macular edema, bilateral: Secondary | ICD-10-CM | POA: Diagnosis not present

## 2022-06-17 MED ORDER — GVOKE HYPOPEN 2-PACK 1 MG/0.2ML ~~LOC~~ SOAJ
SUBCUTANEOUS | 5 refills | Status: DC
  Filled 2022-06-17: qty 0.4, 10d supply, fill #0

## 2022-06-18 ENCOUNTER — Other Ambulatory Visit (HOSPITAL_COMMUNITY): Payer: Self-pay

## 2022-06-25 ENCOUNTER — Other Ambulatory Visit (HOSPITAL_COMMUNITY): Payer: Self-pay

## 2022-06-25 MED ORDER — PROMETHAZINE HCL 25 MG PO TABS
25.0000 mg | ORAL_TABLET | Freq: Three times a day (TID) | ORAL | 1 refills | Status: DC | PRN
  Filled 2022-06-25: qty 90, 30d supply, fill #0

## 2022-06-26 ENCOUNTER — Other Ambulatory Visit (HOSPITAL_COMMUNITY): Payer: Self-pay

## 2022-06-29 ENCOUNTER — Other Ambulatory Visit (HOSPITAL_COMMUNITY): Payer: Self-pay

## 2022-06-30 ENCOUNTER — Other Ambulatory Visit (HOSPITAL_COMMUNITY): Payer: Self-pay

## 2022-07-06 ENCOUNTER — Other Ambulatory Visit (HOSPITAL_COMMUNITY): Payer: Self-pay

## 2022-07-08 ENCOUNTER — Other Ambulatory Visit (HOSPITAL_COMMUNITY): Payer: Self-pay

## 2022-07-08 MED ORDER — AMOXICILLIN 500 MG PO CAPS
500.0000 mg | ORAL_CAPSULE | Freq: Three times a day (TID) | ORAL | 1 refills | Status: DC
  Filled 2022-07-08: qty 15, 5d supply, fill #0

## 2022-07-15 ENCOUNTER — Ambulatory Visit (HOSPITAL_BASED_OUTPATIENT_CLINIC_OR_DEPARTMENT_OTHER)
Admission: RE | Admit: 2022-07-15 | Discharge: 2022-07-15 | Disposition: A | Discharge status: Home / Self Care | Payer: 59 | Source: Ambulatory Visit | Attending: Endocrinology | Admitting: Endocrinology

## 2022-07-15 DIAGNOSIS — Z87891 Personal history of nicotine dependence: Secondary | ICD-10-CM | POA: Diagnosis not present

## 2022-07-20 ENCOUNTER — Other Ambulatory Visit: Payer: Self-pay | Admitting: Acute Care

## 2022-07-20 DIAGNOSIS — Z87891 Personal history of nicotine dependence: Secondary | ICD-10-CM

## 2022-07-20 DIAGNOSIS — Z122 Encounter for screening for malignant neoplasm of respiratory organs: Secondary | ICD-10-CM

## 2022-07-21 ENCOUNTER — Other Ambulatory Visit (HOSPITAL_COMMUNITY): Payer: Self-pay

## 2022-07-21 MED ORDER — OSELTAMIVIR PHOSPHATE 75 MG PO CAPS
75.0000 mg | ORAL_CAPSULE | Freq: Two times a day (BID) | ORAL | 0 refills | Status: AC
  Filled 2022-07-21: qty 10, 5d supply, fill #0

## 2022-07-22 ENCOUNTER — Other Ambulatory Visit (HOSPITAL_COMMUNITY): Payer: Self-pay

## 2022-07-23 ENCOUNTER — Encounter (HOSPITAL_BASED_OUTPATIENT_CLINIC_OR_DEPARTMENT_OTHER): Payer: Self-pay | Admitting: Pharmacist

## 2022-07-23 ENCOUNTER — Other Ambulatory Visit (HOSPITAL_BASED_OUTPATIENT_CLINIC_OR_DEPARTMENT_OTHER): Payer: Self-pay

## 2022-07-23 ENCOUNTER — Other Ambulatory Visit (HOSPITAL_COMMUNITY): Payer: Self-pay

## 2022-07-23 DIAGNOSIS — S12691A Other nondisplaced fracture of seventh cervical vertebra, initial encounter for closed fracture: Secondary | ICD-10-CM | POA: Diagnosis not present

## 2022-07-23 DIAGNOSIS — S22010A Wedge compression fracture of first thoracic vertebra, initial encounter for closed fracture: Secondary | ICD-10-CM | POA: Diagnosis not present

## 2022-07-23 MED ORDER — VARENICLINE TARTRATE 1 MG PO TABS
1.0000 mg | ORAL_TABLET | Freq: Two times a day (BID) | ORAL | 12 refills | Status: DC
  Filled 2022-07-23: qty 56, 28d supply, fill #0
  Filled 2022-09-10: qty 56, 28d supply, fill #1

## 2022-07-23 MED ORDER — ORPHENADRINE CITRATE ER 100 MG PO TB12
100.0000 mg | ORAL_TABLET | Freq: Two times a day (BID) | ORAL | 0 refills | Status: DC
  Filled 2022-07-23 – 2022-09-12 (×2): qty 20, 10d supply, fill #0

## 2022-07-23 MED ORDER — BENZONATATE 100 MG PO CAPS
ORAL_CAPSULE | ORAL | 0 refills | Status: DC
  Filled 2022-07-23: qty 21, 5d supply, fill #0

## 2022-07-23 MED ORDER — HYDROCODONE-ACETAMINOPHEN 5-325 MG PO TABS
1.0000 | ORAL_TABLET | Freq: Four times a day (QID) | ORAL | 0 refills | Status: DC | PRN
  Filled 2022-07-23 – 2022-09-12 (×2): qty 13, 4d supply, fill #0

## 2022-07-24 ENCOUNTER — Other Ambulatory Visit (HOSPITAL_BASED_OUTPATIENT_CLINIC_OR_DEPARTMENT_OTHER): Payer: Self-pay

## 2022-07-24 ENCOUNTER — Other Ambulatory Visit (HOSPITAL_COMMUNITY): Payer: Self-pay

## 2022-07-24 MED ORDER — HYDROCODONE-ACETAMINOPHEN 5-325 MG PO TABS
1.0000 | ORAL_TABLET | Freq: Four times a day (QID) | ORAL | 0 refills | Status: DC | PRN
  Filled 2022-07-24: qty 13, 4d supply, fill #0

## 2022-07-24 MED ORDER — ORPHENADRINE CITRATE ER 100 MG PO TB12
100.0000 mg | ORAL_TABLET | Freq: Two times a day (BID) | ORAL | 0 refills | Status: DC
  Filled 2022-07-24: qty 20, 10d supply, fill #0

## 2022-07-24 MED ORDER — BENZONATATE 100 MG PO CAPS
ORAL_CAPSULE | ORAL | 0 refills | Status: DC
  Filled 2022-07-24: qty 21, 6d supply, fill #0

## 2022-07-26 ENCOUNTER — Other Ambulatory Visit (HOSPITAL_COMMUNITY): Payer: Self-pay

## 2022-07-28 ENCOUNTER — Other Ambulatory Visit (HOSPITAL_COMMUNITY): Payer: Self-pay

## 2022-07-28 DIAGNOSIS — G319 Degenerative disease of nervous system, unspecified: Secondary | ICD-10-CM | POA: Diagnosis not present

## 2022-07-28 DIAGNOSIS — S12601G Unspecified nondisplaced fracture of seventh cervical vertebra, subsequent encounter for fracture with delayed healing: Secondary | ICD-10-CM | POA: Diagnosis not present

## 2022-07-28 DIAGNOSIS — I2510 Atherosclerotic heart disease of native coronary artery without angina pectoris: Secondary | ICD-10-CM | POA: Diagnosis not present

## 2022-07-28 DIAGNOSIS — I6523 Occlusion and stenosis of bilateral carotid arteries: Secondary | ICD-10-CM | POA: Diagnosis not present

## 2022-07-28 DIAGNOSIS — J441 Chronic obstructive pulmonary disease with (acute) exacerbation: Secondary | ICD-10-CM | POA: Diagnosis not present

## 2022-07-28 DIAGNOSIS — E1051 Type 1 diabetes mellitus with diabetic peripheral angiopathy without gangrene: Secondary | ICD-10-CM | POA: Diagnosis not present

## 2022-07-28 DIAGNOSIS — E103593 Type 1 diabetes mellitus with proliferative diabetic retinopathy without macular edema, bilateral: Secondary | ICD-10-CM | POA: Diagnosis not present

## 2022-07-28 DIAGNOSIS — E785 Hyperlipidemia, unspecified: Secondary | ICD-10-CM | POA: Diagnosis not present

## 2022-07-28 DIAGNOSIS — I700 Atherosclerosis of aorta: Secondary | ICD-10-CM | POA: Diagnosis not present

## 2022-07-28 DIAGNOSIS — I739 Peripheral vascular disease, unspecified: Secondary | ICD-10-CM | POA: Diagnosis not present

## 2022-07-28 MED ORDER — CYCLOBENZAPRINE HCL 5 MG PO TABS
ORAL_TABLET | ORAL | 0 refills | Status: DC
  Filled 2022-07-28: qty 60, 30d supply, fill #0

## 2022-07-28 MED ORDER — HYDROCODONE-ACETAMINOPHEN 5-325 MG PO TABS
ORAL_TABLET | ORAL | 0 refills | Status: DC
  Filled 2022-07-28: qty 60, 30d supply, fill #0

## 2022-07-29 ENCOUNTER — Other Ambulatory Visit (HOSPITAL_COMMUNITY): Payer: Self-pay

## 2022-08-06 DIAGNOSIS — M542 Cervicalgia: Secondary | ICD-10-CM | POA: Diagnosis not present

## 2022-08-06 DIAGNOSIS — S12691D Other nondisplaced fracture of seventh cervical vertebra, subsequent encounter for fracture with routine healing: Secondary | ICD-10-CM | POA: Diagnosis not present

## 2022-08-09 ENCOUNTER — Other Ambulatory Visit (HOSPITAL_COMMUNITY): Payer: Self-pay

## 2022-08-09 MED ORDER — SIMVASTATIN 40 MG PO TABS
40.0000 mg | ORAL_TABLET | Freq: Every day | ORAL | 3 refills | Status: DC
  Filled 2022-08-09: qty 90, 90d supply, fill #0
  Filled 2022-11-05: qty 90, 90d supply, fill #1
  Filled 2023-01-30: qty 90, 90d supply, fill #2
  Filled 2023-05-02: qty 90, 90d supply, fill #3

## 2022-08-27 ENCOUNTER — Other Ambulatory Visit (HOSPITAL_COMMUNITY): Payer: Self-pay

## 2022-08-27 MED ORDER — HYDROCODONE-ACETAMINOPHEN 5-325 MG PO TABS
1.0000 | ORAL_TABLET | ORAL | 0 refills | Status: DC | PRN
  Filled 2022-08-27: qty 30, 5d supply, fill #0

## 2022-09-04 ENCOUNTER — Other Ambulatory Visit (HOSPITAL_COMMUNITY): Payer: Self-pay

## 2022-09-13 ENCOUNTER — Other Ambulatory Visit (HOSPITAL_COMMUNITY): Payer: Self-pay

## 2022-09-13 ENCOUNTER — Other Ambulatory Visit: Payer: Self-pay

## 2022-09-14 ENCOUNTER — Other Ambulatory Visit (HOSPITAL_COMMUNITY): Payer: Self-pay

## 2022-09-14 ENCOUNTER — Other Ambulatory Visit: Payer: Self-pay

## 2022-09-15 ENCOUNTER — Other Ambulatory Visit (HOSPITAL_COMMUNITY): Payer: Self-pay

## 2022-09-16 ENCOUNTER — Other Ambulatory Visit (HOSPITAL_COMMUNITY): Payer: Self-pay

## 2022-09-22 ENCOUNTER — Other Ambulatory Visit (HOSPITAL_COMMUNITY): Payer: Self-pay

## 2022-09-22 MED ORDER — TRESIBA FLEXTOUCH 100 UNIT/ML ~~LOC~~ SOPN
36.0000 [IU] | PEN_INJECTOR | Freq: Every day | SUBCUTANEOUS | 5 refills | Status: DC
  Filled 2022-09-22: qty 15, 41d supply, fill #0

## 2022-09-24 ENCOUNTER — Ambulatory Visit: Payer: Self-pay | Admitting: Cardiology

## 2022-09-24 ENCOUNTER — Other Ambulatory Visit (HOSPITAL_COMMUNITY): Payer: Self-pay

## 2022-09-24 MED ORDER — HYDROCODONE-ACETAMINOPHEN 5-325 MG PO TABS
1.0000 | ORAL_TABLET | ORAL | 0 refills | Status: DC | PRN
  Filled 2022-09-24: qty 30, 5d supply, fill #0

## 2022-09-25 ENCOUNTER — Other Ambulatory Visit (HOSPITAL_COMMUNITY): Payer: Self-pay

## 2022-09-25 MED ORDER — CYCLOBENZAPRINE HCL 5 MG PO TABS
5.0000 mg | ORAL_TABLET | Freq: Two times a day (BID) | ORAL | 12 refills | Status: DC
  Filled 2022-09-25: qty 60, 30d supply, fill #0
  Filled 2022-10-14: qty 60, 30d supply, fill #1
  Filled 2022-11-05 – 2022-11-06 (×2): qty 60, 30d supply, fill #2
  Filled 2023-02-09: qty 60, 30d supply, fill #3
  Filled 2023-03-05: qty 60, 30d supply, fill #4
  Filled 2023-04-27: qty 60, 30d supply, fill #5

## 2022-10-08 ENCOUNTER — Other Ambulatory Visit (HOSPITAL_COMMUNITY): Payer: Self-pay

## 2022-10-08 MED ORDER — HYDROCODONE-ACETAMINOPHEN 5-325 MG PO TABS
1.0000 | ORAL_TABLET | Freq: Three times a day (TID) | ORAL | 0 refills | Status: DC | PRN
  Filled 2022-10-08: qty 60, 20d supply, fill #0

## 2022-10-09 ENCOUNTER — Other Ambulatory Visit (HOSPITAL_COMMUNITY): Payer: Self-pay

## 2022-10-11 ENCOUNTER — Other Ambulatory Visit (HOSPITAL_COMMUNITY): Payer: Self-pay

## 2022-10-13 ENCOUNTER — Other Ambulatory Visit (HOSPITAL_COMMUNITY): Payer: Self-pay

## 2022-10-14 ENCOUNTER — Other Ambulatory Visit (HOSPITAL_COMMUNITY): Payer: Self-pay

## 2022-10-19 ENCOUNTER — Other Ambulatory Visit (HOSPITAL_COMMUNITY): Payer: Self-pay

## 2022-10-19 MED ORDER — PREDNISONE 10 MG PO TABS
ORAL_TABLET | ORAL | 0 refills | Status: DC
  Filled 2022-10-19: qty 18, 9d supply, fill #0

## 2022-10-21 ENCOUNTER — Other Ambulatory Visit (HOSPITAL_COMMUNITY): Payer: Self-pay

## 2022-10-21 MED ORDER — BENZONATATE 100 MG PO CAPS
ORAL_CAPSULE | ORAL | 3 refills | Status: DC
  Filled 2022-10-21: qty 90, 30d supply, fill #0

## 2022-10-21 MED ORDER — AZITHROMYCIN 250 MG PO TABS
ORAL_TABLET | ORAL | 0 refills | Status: AC
  Filled 2022-10-21: qty 6, 5d supply, fill #0

## 2022-10-26 ENCOUNTER — Other Ambulatory Visit: Payer: Self-pay

## 2022-11-05 ENCOUNTER — Other Ambulatory Visit: Payer: Self-pay

## 2022-11-05 ENCOUNTER — Telehealth: Payer: Self-pay | Admitting: Pulmonary Disease

## 2022-11-05 ENCOUNTER — Other Ambulatory Visit (HOSPITAL_COMMUNITY): Payer: Self-pay

## 2022-11-05 MED ORDER — PREDNISONE 10 MG PO TABS: ORAL_TABLET | ORAL | 0 refills | Status: DC

## 2022-11-05 MED ORDER — HYDROCODONE-ACETAMINOPHEN 5-325 MG PO TABS
1.0000 | ORAL_TABLET | Freq: Three times a day (TID) | ORAL | 0 refills | Status: DC | PRN
  Filled 2022-11-05: qty 60, 20d supply, fill #0

## 2022-11-05 MED ORDER — GUAIFENESIN-CODEINE 100-10 MG/5ML PO SYRP: 5.0000 mL | ORAL_SOLUTION | Freq: Three times a day (TID) | ORAL | 0 refills | Status: DC | PRN

## 2022-11-05 NOTE — Telephone Encounter (Signed)
Spoke to pt and he states he has a cough, wheezing, and SOB started the [place] before yesterday, taking benzonatate (TESSALON) 100 MG, Mucinex , albuterol (PROVENTIL) (2.5 MG/3ML) for nebulizer and albuterol (VENTOLIN HFA) 108 (90 Base) MCG/ACT inhaler as needed. Pt stated he may need prednisone to knock out the cough. I informed him I will send this message to our Provider of the [place]. Pt verbalized understanding.

## 2022-11-05 NOTE — Telephone Encounter (Signed)
Called and spoke with pt letting him know recs per Dr. Vassie Loll and he verbalized understanding. Nothing further needed.

## 2022-11-05 NOTE — Telephone Encounter (Signed)
PT calling stating he is not feeling well. Severe coughing. Pearls not helping. Poss Strong Cough syrup and Pred Needed. Sometimes he aspirates when heavy coughing so he is concerned.   Pharm is [facility] on [address] (Pharm May differ than what's on file. )  Please call @ [phone]

## 2022-11-05 NOTE — Telephone Encounter (Signed)
Okay to use Delsym 5 mL twice daily over-the-counter as needed for cough.  Knowing his prior history , I have also sent him a Cheratussin cough syrup prescription to use if cough is not controlled by Delsym

## 2022-11-05 NOTE — Addendum Note (Signed)
Addended by: Oretha Milch on: 11/05/2022 04:21 PM   Modules accepted: Orders

## 2022-11-05 NOTE — Telephone Encounter (Signed)
Called and spoke with patient. He verbalized understanding. Medication has been sent to his pharmacy.   Nothing further needed at time of call.  

## 2022-11-05 NOTE — Telephone Encounter (Signed)
Ok to give a pred taper, has a suspected hx asthma. Needs to follow with PM or APP to insure he is improved.   Pred > Take  daily for 3 days, then  daily for 3 days, then  daily for 3 days, then  daily for 3 days, then stop

## 2022-11-05 NOTE — Telephone Encounter (Signed)
Pt called the office stating that he is needing a cough syrup sent to the pharmacy. Pt said he has benzonatate at home and will be getting the prednisone that was prescribed by Dr. Delton Coombes but is needing to have something stronger to help with his cough than the benzonatate.  Pt said he coughs so hard to the point that it makes him vomit.  Dr. Isaiah Serge, please advise on this if you can send in a cough syrup to the pharmacy for pt.  Pharmacy is [facility] [address]  Dr. Isaiah Serge, please advise.

## 2022-11-06 ENCOUNTER — Other Ambulatory Visit (HOSPITAL_COMMUNITY): Payer: Self-pay

## 2022-11-06 ENCOUNTER — Other Ambulatory Visit: Payer: Self-pay

## 2022-11-06 MED ORDER — GUAIFENESIN-CODEINE 100-10 MG/5ML PO SYRP
5.0000 mL | ORAL_SOLUTION | Freq: Three times a day (TID) | ORAL | 0 refills | Status: DC | PRN
  Filled 2022-11-06: qty 75, 5d supply, fill #0

## 2022-11-08 ENCOUNTER — Other Ambulatory Visit (HOSPITAL_COMMUNITY): Payer: Self-pay

## 2022-11-15 ENCOUNTER — Telehealth: Payer: Self-pay | Admitting: Pulmonary Disease

## 2022-11-15 NOTE — Telephone Encounter (Signed)
Has done 2 rounds this month of Pred taper to no avail. (One round of pred was with his PCP, Drt. [place])  Can we refill Guaifenesin as well?

## 2022-11-15 NOTE — Telephone Encounter (Signed)
PT wants to be seen. No appt's avail w/[facility], Cobb, Walsh, Parrett. Please call to advise if we can work him in.  Asthma Exacerbation/Bronchitis  Pharm is [facility]   Pls call [phone]

## 2022-11-16 NOTE — Telephone Encounter (Signed)
Patient is calling again and waiting for a response from the doctor.  Please advise and call patient asap.  CB# [phone]

## 2022-11-17 NOTE — Telephone Encounter (Signed)
RA please advise 

## 2022-11-18 ENCOUNTER — Encounter (HOSPITAL_BASED_OUTPATIENT_CLINIC_OR_DEPARTMENT_OTHER): Payer: Self-pay | Admitting: Pulmonary Disease

## 2022-11-18 ENCOUNTER — Ambulatory Visit (INDEPENDENT_AMBULATORY_CARE_PROVIDER_SITE_OTHER): Payer: Medicare Other

## 2022-11-18 ENCOUNTER — Ambulatory Visit (INDEPENDENT_AMBULATORY_CARE_PROVIDER_SITE_OTHER): Payer: Medicare Other | Admitting: Pulmonary Disease

## 2022-11-18 ENCOUNTER — Other Ambulatory Visit (HOSPITAL_COMMUNITY): Payer: Self-pay

## 2022-11-18 VITALS — BP 126/78 | HR 114 | Temp 97.9°F | Ht 69.0 in | Wt 180.2 lb

## 2022-11-18 DIAGNOSIS — R059 Cough, unspecified: Secondary | ICD-10-CM

## 2022-11-18 MED ORDER — FLUTICASONE-SALMETEROL 250-50 MCG/ACT IN AEPB: 1.0000 | INHALATION_SPRAY | Freq: Two times a day (BID) | RESPIRATORY_TRACT | 5 refills | Status: DC

## 2022-11-18 MED ORDER — GUAIFENESIN-CODEINE 100-10 MG/5ML PO SYRP
5.0000 mL | ORAL_SOLUTION | Freq: Three times a day (TID) | ORAL | 0 refills | Status: DC | PRN
  Filled 2022-11-18: qty 120, 8d supply, fill #0

## 2022-11-18 MED ORDER — PREDNISONE 10 MG PO TABS
ORAL_TABLET | ORAL | 0 refills | Status: DC
  Filled 2022-11-18: qty 20, 8d supply, fill #0

## 2022-11-18 NOTE — Progress Notes (Signed)
Subjective:   PATIENT ID: James [name] GENDER: male DOB: [birth year], MRN: [id number]  Chief Complaint  Patient presents with   Follow-up    Patient states he's got wheezing and rattling in the chest. Has a bad cough. Patient has had cough for 4 weeks. Patient also mentioned shortness of breath.     Reason for Visit: Follow-up   James [name] is 67 year old male former smoker with asthma, DM1 who presents for acute visit.  He was last seen in 06/04/22 for asthma exacerbation. CTA negative for PE but demonstrated evidence of aspiration. He reports history of aspiration after his cervical fusions.  Most recently had an asthma exacerbation 11/05/22 and called in telephone. Treated prednisone and on-hand azithromycin. He reports 50% improved cough, wheezing and shortness of breath but still persists. Has not been on maintenance bronchodilator in the last two years. He is followed by Dr. Vassie Loll at [facility].      No data to display         Social History: Quit smoking 4-5 years. 40 pack-years  I have personally reviewed patient's past medical/family/social history, allergies, current medications.  Past Medical History:  Diagnosis Date   ANXIETY 04/03/2007   Anxiety    ASTHMA 09/06/2008   Asthma    ASTHMATIC BRONCHITIS, ACUTE 10/25/2008   Bladder neck obstruction    CARPAL TUNNEL SYNDROME, BILATERAL 07/31/2007   issues resolved, no surgery   Cervical disc disease    Chronic bronchitis (HCC)    "get it about q yr" (02/12/2014)   COPD (chronic obstructive pulmonary disease) (HCC)    CORONARY ARTERY DISEASE 04/03/2007   DEPRESSION 04/03/2007   Depression    DIABETES MELLITUS, TYPE I 04/03/2007   Diabetic retinopathy associated with diabetes mellitus due to underlying condition (HCC) 04/03/2007   DM W/EYE MANIFESTATIONS, TYPE I, UNCONTROLLED 04/04/2007   DM W/RENAL MNFST, TYPE I, UNCONTROLLED 04/04/2007   ED (erectile dysfunction)    History of kidney stones    HYPERLIPIDEMIA  04/04/2007   Pneumonia    "several times and again today" (02/13/2104)   Renal insufficiency    Seizures (HCC)    "insulin seizure from time to time; none in the last couple years" (02/12/2014)   Spinal stenosis      Family History  Problem Relation Age of Onset   Stroke Father        strong FH cerebrovascular disease   Diabetes Brother    Heart disease Brother        CHF   Cancer Mother    Colon cancer Neg Hx      Social History   Occupational History    Employer: UNEMPLOYED    Comment: owns a Aeronautical engineer business  Tobacco Use   Smoking status: Former    Packs/Jarnagin: 0    Types: Cigarettes    Quit date: 08/20/2019    Years since quitting: 3.2   Smokeless tobacco: Never   Tobacco comments:    Successfully quit smoking 08/20/2019  Vaping Use   Vaping Use: Never used  Substance and Sexual Activity   Alcohol use: No   Drug use: No   Sexual activity: Not Currently    Allergies  Allergen Reactions   Augmentin [Amoxicillin-Pot Clavulanate] Nausea And Vomiting and Other (See Comments)    Did it involve swelling of the face/tongue/throat, SOB, or low BP? No Did it involve sudden or severe rash/hives, skin peeling, or any reaction on the inside of your mouth or nose?  No Did you need to seek medical attention at a hospital or doctor's office? No When did it last happen?      5+ years If all above answers are "NO", may proceed with cephalosporin use.    Lexapro [Escitalopram Oxalate] Itching     Outpatient Medications Prior to Visit  Medication Sig Dispense Refill   acetaminophen (TYLENOL) 500 MG tablet Take 1,000 mg by mouth every 6 (six) hours as needed for mild pain or headache.     albuterol (PROVENTIL) (2.5 MG/3ML) 0.083% nebulizer solution Take 3 mLs by nebulization every 6 (six) hours as needed for wheezing or shortness of breath. 180 mL 3   aspirin EC 81 MG tablet Take 1 tablet (81 mg total) by mouth daily. Swallow whole. (Patient taking differently: Take 81 mg by mouth  at bedtime. Swallow whole.) 30 tablet 11   cetirizine (ZYRTEC) 10 MG tablet Take 10 mg by mouth daily.     Continuous Blood Gluc Sensor (FREESTYLE LIBRE 3 SENSOR) MISC Change sensor every 14 days to monitor blood glucose continously 2 each 6   fluticasone (FLONASE) 50 MCG/ACT nasal spray Place 2 sprays into both nostrils daily. 48 g 3   Glucagon (GVOKE HYPOPEN 2-PACK) 1 MG/0.2ML SOAJ Inject under the skin as directed for severe hypoglycemia. 0.4 mL 5   guaiFENesin-codeine (ROBITUSSIN AC) 100-10 MG/5ML syrup Take 5 mLs by mouth 3 (three) times daily as needed for cough. 120 mL 0   guaiFENesin-codeine (ROBITUSSIN AC) 100-10 MG/5ML syrup Take 5 mLs by mouth 3 (three) times daily as needed for cough. 120 mL 0   HYDROcodone-acetaminophen (NORCO/VICODIN) 5-325 MG tablet Take 1 tablet by mouth every 6 (six) hours as needed for moderate pain for up to 5 days. 13 tablet 0   HYDROcodone-acetaminophen (NORCO/VICODIN) 5-325 MG tablet Take 1 tablet by mouth three times a [place] as needed. 60 tablet 0   HYDROcodone-acetaminophen (NORCO/VICODIN) 5-325 MG tablet Take 1 tablet by mouth every 4 hours as needed for pain 30 tablet 0   HYDROcodone-acetaminophen (NORCO/VICODIN) 5-325 MG tablet Take 1 tablet by mouth 3 (three) times daily as needed. 60 tablet 0   ibuprofen (ADVIL) 200 MG tablet Take 800 mg by mouth every 8 (eight) hours as needed (pain.).     insulin degludec (TRESIBA FLEXTOUCH) 100 UNIT/ML FlexTouch Pen Inject 36 Units into the skin daily. 15 mL 5   insulin glargine-yfgn (SEMGLEE, YFGN,) 100 UNIT/ML Pen Inject 38 units under the skin daily as directed 45 mL 12   insulin lispro (HUMALOG KWIKPEN) 100 UNIT/ML KwikPen INJECT UP TO 100 UNITS A Masters UNDER THE SKIN AS DIRECTED 45 mL 12   Insulin Pen Needle 31G X 8 MM MISC Use 1 pen needle subcutaneously 4 - 5 times daily as directed. 500 each 3   levothyroxine (SYNTHROID) 100 MCG tablet Take 1 tablet (100 mcg total) by mouth daily. 90 tablet 3   metoCLOPramide  (REGLAN) 5 MG tablet Take 1 tablet (5 mg total) by mouth 2 (two) times daily before meals 180 tablet 3   ondansetron (ZOFRAN-ODT) 4 MG disintegrating tablet Dissolve 1 tablet (4 mg total) by mouth every 8 (eight) hours as needed for nausea or vomiting. 20 tablet 0   orphenadrine (NORFLEX) 100 MG tablet Take 1 tablet (100 mg total) by mouth in the morning and 1 tablet (100 mg total) before bedtime. Do all this for 10 days. 20 tablet 0   orphenadrine (NORFLEX) 100 MG tablet Take 1 tablet (100 mg total) by mouth  in the morning and 1 tablet (100 mg total) before bedtime. Do all this for 10 days. 20 tablet 0   pantoprazole (PROTONIX) 40 MG tablet Take 1 tablet by mouth daily 90 tablet 12   penicillin v potassium (VEETID) 500 MG tablet Take 1 tablet (500 mg total) by mouth 4 (four) times daily until complete 28 tablet 0   predniSONE (DELTASONE) 10 MG tablet Take 4 tabs by mouth for 3 days, then 3 for 3 days, 2 for 3 days, 1 for 3 days and stop 30 tablet 0   predniSONE (DELTASONE) 20 MG tablet Take 2 tablets (40 mg total) by mouth daily for 5 days 10 tablet 0   promethazine (PHENERGAN) 25 MG tablet Take 1 tablet (25 mg total) by mouth 3 (three) times daily as needed. 90 tablet 1   simvastatin (ZOCOR) 40 MG tablet Take 1 tablet (40 mg total) by mouth daily. 90 tablet 3   tamsulosin (FLOMAX) 0.4 MG CAPS capsule Take 1 capsule by mouth daily 90 capsule 3   traZODone (DESYREL) 150 MG tablet Take 1 tablet (150 mg total) by mouth at bedtime as needed for sleep. 90 tablet 2   varenicline (CHANTIX) 1 MG tablet Take 1 tablet (1 mg total) by mouth 2 (two) times daily. 56 tablet 12   albuterol (VENTOLIN HFA) 108 (90 Base) MCG/ACT inhaler INHALE 2 PUFFS BY MOUTH INTO THE LUNGS EVERY 6 HOURS AS NEEDED FOR WHEEZING OR SHORTNESS OF BREATH. 18 g 2   amoxicillin (AMOXIL) 500 MG capsule Take 1 capsule (500 mg total) by mouth every 8 (eight) hours with yogurt (Patient not taking: Reported on 11/18/2022) 15 capsule 1    amoxicillin-clavulanate (AUGMENTIN) 875-125 MG tablet Take 1 tablet by mouth every 12 (twelve) hours. (Patient not taking: Reported on 11/18/2022) 14 tablet 0   benzonatate (TESSALON) 100 MG capsule Take 2 capsules (200 mg total) by mouth every 8 (eight) hours for 7 days. (Patient not taking: Reported on 11/18/2022) 21 capsule 0   benzonatate (TESSALON) 100 MG capsule Take 2 capsules (200 mg total) by mouth every 8 (eight) hours for 7 days. (Patient not taking: Reported on 11/18/2022) 21 capsule 0   benzonatate (TESSALON) 100 MG capsule Take 1 capsule by mouth 3 times daily as needed (Patient not taking: Reported on 11/18/2022) 90 capsule 3   cyclobenzaprine (FLEXERIL) 5 MG tablet Take 1 tablet (5 mg total) by mouth 2 (two) times daily. (Patient not taking: Reported on 11/18/2022) 60 tablet 12   gabapentin (NEURONTIN) 300 MG capsule Take 1 capsule by mouth twice daily as needed (Patient not taking: Reported on 11/18/2022) 180 capsule 3   Glucagon, rDNA, (GLUCAGON EMERGENCY) 1 MG KIT USE AS NEEDED FOR SEVERE HYPOGLYCEMIA 2 kit 12   glucose blood (CONTOUR NEXT TEST) test strip USE 8 TIMES DAILY AS DIRECTED TO MONITOR BLOOD GLUCOSE (Patient not taking: Reported on 11/18/2022) 600 strip 4   metoCLOPramide (REGLAN) 5 MG tablet TAKE 1 TABLET BY MOUTH TWICE DAILY BEFORE A MEAL. 180 tablet 3   No facility-administered medications prior to visit.    Review of Systems  Constitutional:  Negative for chills, diaphoresis, fever, malaise/fatigue and weight loss.  HENT:  Negative for congestion.   Respiratory:  Negative for cough, hemoptysis, sputum production, shortness of breath and wheezing.   Cardiovascular:  Negative for chest pain, palpitations and leg swelling.     Objective:   Vitals:   11/18/22 1420  BP: 126/78  Pulse: (!) 114  Temp: 97.9 F (  36.6 C)  TempSrc: Oral  SpO2: 96%  Weight: 180 lb 3.2 oz (81.7 kg)  Height: 5\' 9"  (1.753 m)   SpO2: 96 % O2 Device: None (Room air)  Physical Exam: General:  Well-appearing, no acute distress HENT: , AT Eyes: EOMI, no scleral icterus Respiratory: Clear to auscultation bilaterally.  No crackles, wheezing or rales Cardiovascular: RRR, -M/R/G, no JVD Extremities:-Edema,-tenderness Neuro: AAO x4, CNII-XII grossly intact Psych: Normal mood, normal affect  Data Reviewed:  Imaging: CXR 11/18/22 - no infiltrate  PFT: None on file  Labs: CBC    Component Value Date/Time   WBC 4.0 04/22/2020 0847   WBC 4.0 12/02/2019 0912   RBC 5.09 04/22/2020 0847   RBC 4.03 (L) 12/02/2019 0912   HGB 14.9 07/31/2021 1120   HCT 42.3 07/31/2021 1120   PLT 266 04/22/2020 0847   MCV 91 04/22/2020 0847   MCH 31.0 04/22/2020 0847   MCH 31.8 12/02/2019 0912   MCHC 34.3 04/22/2020 0847   MCHC 35.7 12/02/2019 0912   RDW 12.7 04/22/2020 0847   LYMPHSABS 0.8 12/01/2019 0518   MONOABS 0.6 12/01/2019 0518   EOSABS 0.0 12/01/2019 0518   BASOSABS 0.0 12/01/2019 0518      Assessment & Plan:   Discussion: 67 year old male former smoker with asthma, DM1, cervical disc disease who presents for acute visit for asthma exacerbation.  Asthma exacerbation ORDER Wixela 250-50 mcg ONE puff in the morning and evening. Rinse mouth out after use CONTINUE Albuterol or nebulizer AS NEEDED Prednisone taper ordered Cough syrup with codeine ordered  Health Maintenance Immunization History  Administered Date(s) Administered   Fluad Quad(high Dose 65+) 04/15/2022   Influenza Split 04/19/2011, 05/22/2012, 04/17/2013, 04/18/2017, 03/28/2019   Influenza Whole 07/31/2007, 04/15/2009, 05/08/2010   Influenza, Quadrivalent, Recombinant, Inj, Pf 03/22/2018, 04/28/2020, 03/25/2021   Influenza,inj,Quad PF,6+ Mos 03/19/2015, 05/19/2016   Influenza-Unspecified 03/19/2015, 04/12/2017   PFIZER(Purple Top)SARS-COV-2 Vaccination 09/21/2019, 10/12/2019, 04/13/2020, 10/24/2020, 03/25/2021   Pfizer Covid-19 Vaccine Bivalent Booster 26yrs & up 04/09/2021, 12/28/2021   Pneumococcal  Polysaccharide-23 04/19/2003, 09/24/2011, 04/11/2019   Respiratory Syncytial Virus Vaccine,Recomb Aduvanted(Arexvy) 06/04/2022   Td 12/22/2000   Tdap 09/24/2011, 08/19/2013   Zoster Recombinat (Shingrix) 09/27/2019, 12/20/2019   Zoster, Live 09/27/2019, 12/20/2019    Orders Placed This Encounter  Procedures   DG Chest 2 View    Standing Status:   Future    Number of Occurrences:   1    Standing Expiration Date:   11/18/2023    Order Specific Question:   Reason for Exam (SYMPTOM  OR DIAGNOSIS REQUIRED)    Answer:   cough, wheezing    Order Specific Question:   Preferred imaging location?    Answer:   [facility]   Meds ordered this encounter  Medications   predniSONE (DELTASONE) 10 MG tablet    Sig: Take 4 tablets by mouth daily with breakfast for 2 days, THEN take 3 tablets daily for 2 days, THEN take 2 tablets daily for 2 days, THEN take 1 tablet daily for 2 days.    Dispense:  20 tablet    Refill:  0   guaiFENesin-codeine (ROBITUSSIN AC) 100-10 MG/5ML syrup    Sig: Take 5 mLs by mouth 3 (three) times daily as needed for cough.    Dispense:  120 mL    Refill:  0   fluticasone-salmeterol (WIXELA INHUB) 250-50 MCG/ACT AEPB    Sig: Inhale 1 puff into the lungs in the morning and at bedtime.    Dispense:  60 each    Refill:  5    Return in about 2 months (around 01/18/2023) for with Dr. Vassie Loll.  I have spent a total time of 36-minutes on the [place] of the appointment reviewing prior documentation, coordinating care and discussing medical diagnosis and plan with the patient/family. Imaging, labs and tests included in this note have been reviewed and interpreted independently by me.  Katheline Brendlinger Mechele Collin, MD [facility] 11/18/2022 2:45 PM  Office Number [phone]

## 2022-11-18 NOTE — Patient Instructions (Signed)
Asthma exacerbation ORDER Wixela 250-50 mcg ONE puff in the morning and evening. Rinse mouth out after use CONTINUE Albuterol or nebulizer AS NEEDED Prednisone taper ordered Cough syrup with codeine ordered

## 2022-11-19 ENCOUNTER — Other Ambulatory Visit (HOSPITAL_COMMUNITY): Payer: Self-pay

## 2022-11-23 NOTE — Telephone Encounter (Signed)
ATC patient. LVMTCB. 

## 2022-11-24 ENCOUNTER — Other Ambulatory Visit (HOSPITAL_COMMUNITY): Payer: Self-pay

## 2022-11-24 NOTE — Telephone Encounter (Signed)
Dr. Vassie Loll, please advise on this if you think we might be able to work pt in for a sooner visit in [place] with you as pt only wants to see you.   I was looking at your schedules and saw on June 4 at 1:15, there is a held slot that says [facility]. Could we possibly use this slot to get pt in for a visit with you or does this slot need to stay held for someone else?

## 2022-11-24 NOTE — Telephone Encounter (Signed)
Called and spoke with pt letting him know that Dr. Vassie Loll said he could work him in either tomorrow or Friday for appt. Pt verbalized understanding. Pt has been scheduled to see Dr. Vassie Loll at 11:45 Friday 5/10. Nothing further needed.

## 2022-11-24 NOTE — Telephone Encounter (Signed)
Pt. Called back made apt. 8.1.24 too see Dr.Alva pt. Only wants to see him and only in [place] I stated the nurses will call if they need to get him seen sooner

## 2022-11-25 ENCOUNTER — Other Ambulatory Visit (HOSPITAL_COMMUNITY): Payer: Self-pay

## 2022-11-25 MED ORDER — TRESIBA FLEXTOUCH 100 UNIT/ML ~~LOC~~ SOPN
36.0000 [IU] | PEN_INJECTOR | Freq: Every day | SUBCUTANEOUS | 5 refills | Status: DC
  Filled 2022-11-25: qty 15, 41d supply, fill #0

## 2022-11-26 ENCOUNTER — Other Ambulatory Visit (HOSPITAL_COMMUNITY): Payer: Self-pay

## 2022-11-26 ENCOUNTER — Ambulatory Visit (INDEPENDENT_AMBULATORY_CARE_PROVIDER_SITE_OTHER): Payer: Medicare Other | Admitting: Pulmonary Disease

## 2022-11-26 ENCOUNTER — Encounter (HOSPITAL_BASED_OUTPATIENT_CLINIC_OR_DEPARTMENT_OTHER): Payer: Self-pay | Admitting: Pulmonary Disease

## 2022-11-26 VITALS — BP 130/78 | HR 96 | Ht 69.0 in | Wt 181.0 lb

## 2022-11-26 DIAGNOSIS — J45998 Other asthma: Secondary | ICD-10-CM

## 2022-11-26 DIAGNOSIS — J690 Pneumonitis due to inhalation of food and vomit: Secondary | ICD-10-CM

## 2022-11-26 DIAGNOSIS — T17908A Unspecified foreign body in respiratory tract, part unspecified causing other injury, initial encounter: Secondary | ICD-10-CM

## 2022-11-26 MED ORDER — PREDNISONE 10 MG PO TABS
ORAL_TABLET | ORAL | 0 refills | Status: AC
  Filled 2022-11-26: qty 40, 16d supply, fill #0

## 2022-11-26 NOTE — Assessment & Plan Note (Addendum)
He has taken Z-Pak and now has clear sputum.  Sputum changes color, we will give him another round of antibiotic.  Augmentin is not a true allergy.  We can also use Omnicef We will also refer him to speech therapy for his dysphagia

## 2022-11-26 NOTE — Assessment & Plan Note (Signed)
Continue Breztri twice daily. Use albuterol nebs for rescue. Will treat with a longer prednisone course starting at 40 mg for 2 weeks taper
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
## 2022-11-26 NOTE — Patient Instructions (Addendum)
X Prednisone 10 mg tabs Take 4 tabs  daily with food x 4 days, then 3 tabs daily x 4 days, then 2 tabs daily x 4 days, then 1 tab daily x4 days then stop. #40   X speech therapy referral for aspiration  Continue breztri twice daily  Call for antibiotic if green phlegm

## 2022-11-27 ENCOUNTER — Other Ambulatory Visit (HOSPITAL_COMMUNITY): Payer: Self-pay

## 2022-11-30 ENCOUNTER — Other Ambulatory Visit (HOSPITAL_COMMUNITY): Payer: Self-pay

## 2022-11-30 MED ORDER — TECHLITE PEN NEEDLES 31G X 8 MM MISC
3 refills | Status: AC
  Filled 2022-11-30: qty 500, 90d supply, fill #0
  Filled 2022-12-20: qty 100, 20d supply, fill #0
  Filled 2023-03-11 – 2023-03-26 (×3): qty 100, 20d supply, fill #1
  Filled 2023-05-02: qty 100, 20d supply, fill #2
  Filled 2023-05-23: qty 500, 90d supply, fill #2
  Filled 2023-08-20: qty 100, 20d supply, fill #2

## 2022-12-03 ENCOUNTER — Other Ambulatory Visit (HOSPITAL_COMMUNITY): Payer: Self-pay

## 2022-12-07 ENCOUNTER — Other Ambulatory Visit (HOSPITAL_COMMUNITY): Payer: Self-pay

## 2022-12-09 ENCOUNTER — Other Ambulatory Visit (HOSPITAL_COMMUNITY): Payer: Self-pay

## 2022-12-10 ENCOUNTER — Other Ambulatory Visit (HOSPITAL_COMMUNITY): Payer: Self-pay

## 2022-12-10 MED ORDER — TRAZODONE HCL 150 MG PO TABS
150.0000 mg | ORAL_TABLET | Freq: Every evening | ORAL | 4 refills | Status: DC | PRN
  Filled 2022-12-10: qty 90, 90d supply, fill #0
  Filled 2023-05-01: qty 90, 90d supply, fill #1
  Filled 2023-08-09: qty 90, 90d supply, fill #2
  Filled 2023-11-16: qty 90, 90d supply, fill #3

## 2022-12-11 ENCOUNTER — Other Ambulatory Visit (HOSPITAL_COMMUNITY): Payer: Self-pay

## 2022-12-14 ENCOUNTER — Other Ambulatory Visit (HOSPITAL_COMMUNITY): Payer: Self-pay

## 2022-12-14 MED ORDER — TRESIBA FLEXTOUCH 100 UNIT/ML ~~LOC~~ SOPN
36.0000 [IU] | PEN_INJECTOR | Freq: Every day | SUBCUTANEOUS | 5 refills | Status: DC
  Filled 2022-12-14: qty 12, 33d supply, fill #0

## 2022-12-20 ENCOUNTER — Other Ambulatory Visit (HOSPITAL_COMMUNITY): Payer: Self-pay

## 2022-12-20 MED ORDER — HYDROCODONE-ACETAMINOPHEN 5-325 MG PO TABS
1.0000 | ORAL_TABLET | Freq: Three times a day (TID) | ORAL | 0 refills | Status: DC | PRN
  Filled 2022-12-20 – 2023-02-07 (×2): qty 60, 20d supply, fill #0

## 2022-12-20 MED ORDER — HYDROCODONE-ACETAMINOPHEN 5-325 MG PO TABS
1.0000 | ORAL_TABLET | Freq: Three times a day (TID) | ORAL | 0 refills | Status: DC | PRN
  Filled 2022-12-20: qty 60, 20d supply, fill #0

## 2022-12-20 MED ORDER — TRESIBA FLEXTOUCH 100 UNIT/ML ~~LOC~~ SOPN
40.0000 [IU] | PEN_INJECTOR | Freq: Every day | SUBCUTANEOUS | 11 refills | Status: DC
  Filled 2022-12-20 (×2): qty 12, 30d supply, fill #0
  Filled 2023-01-21: qty 12, 30d supply, fill #1
  Filled 2023-02-14: qty 12, 30d supply, fill #2
  Filled 2023-03-16: qty 12, 30d supply, fill #3
  Filled 2023-04-16: qty 12, 30d supply, fill #4
  Filled 2023-05-25 – 2023-06-16 (×3): qty 12, 30d supply, fill #5
  Filled 2023-07-13 – 2023-09-01 (×2): qty 12, 30d supply, fill #6
  Filled 2023-10-14: qty 12, 30d supply, fill #7
  Filled 2023-10-20: qty 6, 15d supply, fill #7

## 2022-12-21 ENCOUNTER — Other Ambulatory Visit (HOSPITAL_COMMUNITY): Payer: Self-pay

## 2022-12-28 ENCOUNTER — Other Ambulatory Visit (HOSPITAL_COMMUNITY): Payer: Self-pay

## 2022-12-30 NOTE — Therapy (Deleted)
OUTPATIENT SPEECH LANGUAGE PATHOLOGY SWALLOW EVALUATION   Patient Name: James [name] MRN: [id number] DOB:1956-02-28, 67 y.o., male Today's Date: 12/30/2022  PCP: Adrian Prince, MDRef Provider (PCP)  REFERRING PROVIDER:   Oretha Milch, MD    END OF SESSION:   Past Medical History:  Diagnosis Date   ANXIETY 04/03/2007   Anxiety    ASTHMA 09/06/2008   Asthma    ASTHMATIC BRONCHITIS, ACUTE 10/25/2008   Bladder neck obstruction    CARPAL TUNNEL SYNDROME, BILATERAL 07/31/2007   issues resolved, no surgery   Cervical disc disease    Chronic bronchitis (HCC)    "get it about q yr" (02/12/2014)   COPD (chronic obstructive pulmonary disease) (HCC)    CORONARY ARTERY DISEASE 04/03/2007   DEPRESSION 04/03/2007   Depression    DIABETES MELLITUS, TYPE I 04/03/2007   Diabetic retinopathy associated with diabetes mellitus due to underlying condition (HCC) 04/03/2007   DM W/EYE MANIFESTATIONS, TYPE I, UNCONTROLLED 04/04/2007   DM W/RENAL MNFST, TYPE I, UNCONTROLLED 04/04/2007   ED (erectile dysfunction)    History of kidney stones    HYPERLIPIDEMIA 04/04/2007   Pneumonia    "several times and again today" (02/13/2104)   Renal insufficiency    Seizures (HCC)    "insulin seizure from time to time; none in the last couple years" (02/12/2014)   Spinal stenosis    Past Surgical History:  Procedure Laterality Date   ANTERIOR CERVICAL DECOMP/DISCECTOMY FUSION  2000   "couple screws and a plate"   ANTERIOR CERVICAL DECOMP/DISCECTOMY FUSION N/A 06/24/2016   Procedure: ANTERIOR CERVICAL DECOMPRESSION/DISCECTOMY FUSION CERVICAL FOUR - CERVICAL FIVE, CERVICAL FIVE - CERVICAL SIX; REMOVAL TETHER CERVICAL PLATE;  Surgeon: Shirlean Kelly, MD;  Location: [facility];  Service: Neurosurgery;  Laterality: N/A;  ANTERIOR CERVICAL DECOMPRESSION/DISCECTOMY FUSION CERVICAL FOUR - CERVICAL FIVE, CERVICAL FIVE - CERVICAL SIX; REMOVAL TETHER CERVICAL PLATE   APPENDECTOMY     BACK SURGERY     CARDIAC CATHETERIZATION  1990's    CATARACT EXTRACTION W/ INTRAOCULAR LENS  IMPLANT, BILATERAL Bilateral    CYSTOSCOPY WITH RETROGRADE PYELOGRAM, URETEROSCOPY AND STENT PLACEMENT Bilateral 04/06/2013   Procedure: BILATERAL CYSTOSCOPY WITH RETROGRADE PYELOGRAMS, STENT PLACEMENTS AND LEFT URETEROSCOPY AND STONE REMOVAL;  Surgeon: Sebastian Ache, MD;  Location: [facility];  Service: Urology;  Laterality: Bilateral;   CYSTOSCOPY WITH STENT PLACEMENT Right 04/12/2013   Procedure: CYSTOSCOPY WITH STENT PLACEMENT;  Surgeon: Sebastian Ache, MD;  [facility];  Service: Urology;  Laterality: Right;   CYSTOSCOPY/RETROGRADE/URETEROSCOPY Bilateral 04/12/2013   Procedure: CYSTOSCOPY/RETROGRADE/URETEROSCOPY;  Surgeon: Sebastian Ache, MD;  [facility];  Service: Urology;  Laterality: Bilateral;  RIGHT RETROGRADE    HOLMIUM LASER APPLICATION Left 04/06/2013   Procedure: HOLMIUM LASER APPLICATION;  Surgeon: Sebastian Ache, MD;  [facility];  Service: Urology;  Laterality: Left;   LEFT HEART CATH AND CORONARY ANGIOGRAPHY N/A 04/29/2020   Procedure: LEFT HEART CATH AND CORONARY ANGIOGRAPHY;  Surgeon: Elder Negus, MD;  [facility];  Service: Cardiovascular;  Laterality: N/A;   LUMBAR LAMINECTOMY/DECOMPRESSION MICRODISCECTOMY N/A 04/20/2019   Procedure: Lumbar microdisectomy and decompression L5-S1 left;  Surgeon: Ranee Gosselin, MD;  Location: [facility];  Service: Orthopedics;  Laterality: N/A;    LYMPH NODE DISSECTION  ~ 1960   groin   stress cardiolite  09/06/2002   TONSILLECTOMY     VITRECTOMY Bilateral    Patient Active Problem List   Diagnosis Date Noted   Autoimmune thyroiditis 12/10/2020   Benign prostatic hyperplasia with lower  urinary tract symptoms 12/10/2020   Diastolic dysfunction 12/10/2020   Occlusion and stenosis of bilateral carotid arteries 12/10/2020   Bronchiectasis with (acute) exacerbation (HCC) 12/06/2019   Medication management 11/09/2019   Seizure disorder, secondary (HCC)  07/19/2019   Abnormal CT of the chest 07/19/2019   Spinal stenosis, 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   Hypoglycemia due to insulin 04/20/2019   DM type 1 (diabetes mellitus, type 1) (HCC) 04/20/2019   Anemia 01/05/2019   Long term (current) use of insulin (HCC) 07/07/2017   HNP (herniated nucleus pulposus), cervical 06/24/2016   Syncope 05/20/2016   Tobacco abuse 05/20/2016   Cervical disc disorder with radiculopathy of cervical region 05/20/2016   Major depression, single episode 02/12/2015   Aspiration pneumonia (HCC) 02/12/2014   Sleep disorder 10/30/2012   Peripheral vascular disease (HCC) 06/25/2011   Gastroparesis 02/10/2011   Esophageal reflux 12/21/2010   TOBACCO USE, QUIT 05/08/2010   CONTUSION, RIGHT CHEST WALL 04/07/2010   Chronic asthmatic bronchitis 10/25/2008   Asthma, persistent controlled 09/06/2008   Cough 09/06/2008   SHOULDER PAIN, RIGHT 08/19/2008   BLADDER OUTLET OBSTRUCTION 04/15/2008   RASH AND OTHER NONSPECIFIC SKIN ERUPTION 02/21/2008   CARPAL TUNNEL SYNDROME, BILATERAL 07/31/2007   Allergic rhinitis 07/31/2007   RENAL INSUFFICIENCY 07/31/2007   OTHER TENOSYNOVITIS OF HAND AND WRIST 07/31/2007   DM W/RENAL MNFST, TYPE I, UNCONTROLLED 04/04/2007   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  Rationale for Evaluation and Treatment: Rehabilitation  SUBJECTIVE:   SUBJECTIVE STATEMENT: *** Pt accompanied by: {accompnied:27141}  PERTINENT HISTORY: 67 yo  smoker for follow-up of chronic  asthmatic bronchitis / bronchiectasis and recurrent aspiration pneumonia following cervical fusion   He quit smoking 03/2015   PAIN:  Are you having pain? {OPRCPAIN:27236}  FALLS: Has patient fallen in last 6 months?  {VOZDGUYQ:03474}  LIVING ENVIRONMENT: Lives with: {OPRC lives with:25569::"lives with their family"} Lives in: {Lives in:25570}  PLOF:  Level of assistance: {QVZDGLO:75643} Employment: {SLPemployment:25674}  PATIENT GOALS: ***  OBJECTIVE:   DIAGNOSTIC FINDINGS: ***  RECOMMENDATIONS FROM OBJECTIVE SWALLOW STUDY (MBSS/FEES):  *** Objective swallow impairments: *** Objective recommended compensations: ***  COGNITION: Overall cognitive status: {cognition:24006} Areas of impairment:  {cognitiveimpairmentslp:27409} Functional deficits: ***  ORAL MOTOR EXAMINATION: Overall status: {OMESLP2:27645} Comments: ***  CLINICAL SWALLOW ASSESSMENT:   Current diet: {slpdiet:27196} Dentition: {dentition:27197} Patient directly observed with POs: {POobserved:27199} Feeding: {slp feeding:27200} Liquids provided by: {SLPliquids:27201} Oral phase signs and symptoms: {SLPoralphase:27202} Pharyngeal phase signs and symptoms: {SLPpharyngealphase:27203}  PATIENT REPORTED OUTCOME MEASURES (PROM): {SLPPROM:27095}   TODAY'S TREATMENT:  DATE: ***  PATIENT EDUCATION: Education details: *** Person educated: {Person educated:25204} Education method: {Education Method:25205} Education comprehension: {Education Comprehension:25206}   ASSESSMENT:  CLINICAL IMPRESSION: Patient is a *** y.o. *** who was seen today for ***.   OBJECTIVE IMPAIRMENTS: include {SLPOBJIMP:27107}. These impairments are limiting patient from {SLPLIMIT:27108}. Factors affecting potential to achieve goals and functional outcome are {SLP potential:25450}. Patient will  benefit from skilled SLP services to address above impairments and improve overall function.  REHAB POTENTIAL: {rehabpotential:25112}   GOALS: Goals reviewed with patient? {yes/no:20286}  SHORT TERM GOALS: Target date: ***  *** Baseline: Goal status: {GOALSTATUS:25110}  2.  *** Baseline:  Goal status: {GOALSTATUS:25110}  3.  *** Baseline:  Goal status: {GOALSTATUS:25110}  4.  *** Baseline:  Goal status: {GOALSTATUS:25110}  5.  *** Baseline:  Goal status: {GOALSTATUS:25110}  6.  *** Baseline:  Goal status: {GOALSTATUS:25110}  LONG TERM GOALS: Target date: ***  *** Baseline:  Goal status: {GOALSTATUS:25110}  2.  *** Baseline:  Goal status: {GOALSTATUS:25110}  3.  *** Baseline:  Goal status: {GOALSTATUS:25110}  4.  *** Baseline:  Goal status: {GOALSTATUS:25110}  5.  *** Baseline:  Goal status: {GOALSTATUS:25110}  6.  *** Baseline:  Goal status: {GOALSTATUS:25110}  PLAN:  SLP FREQUENCY: {rehab frequency:25116}  SLP DURATION: {rehab duration:25117}  PLANNED INTERVENTIONS: {SLP treatment/interventions:25449}    [place], Student-SLP 12/30/2022, 1:44 PM

## 2022-12-31 ENCOUNTER — Other Ambulatory Visit: Payer: Self-pay

## 2022-12-31 ENCOUNTER — Ambulatory Visit: Payer: Medicare Other | Admitting: Speech Pathology

## 2022-12-31 ENCOUNTER — Encounter: Payer: Self-pay | Admitting: Speech Pathology

## 2022-12-31 ENCOUNTER — Ambulatory Visit: Payer: Medicare Other | Attending: Pulmonary Disease | Admitting: Speech Pathology

## 2022-12-31 DIAGNOSIS — T17908A Unspecified foreign body in respiratory tract, part unspecified causing other injury, initial encounter: Secondary | ICD-10-CM | POA: Insufficient documentation

## 2022-12-31 DIAGNOSIS — R1312 Dysphagia, oropharyngeal phase: Secondary | ICD-10-CM | POA: Diagnosis present

## 2022-12-31 NOTE — Therapy (Signed)
OUTPATIENT SPEECH LANGUAGE PATHOLOGY SWALLOW EVALUATION   Patient Name: James [name] MRN: [id number] DOB:06/30/1956, 67 y.o., male Today's Date: 12/31/2022  PCP: Adrian Prince MD REFERRING PROVIDER: Oretha Milch, MD  END OF SESSION:  End of Session - 12/31/22 1407     Visit Number 1    Number of Visits 12    Date for SLP Re-Evaluation 02/11/23    SLP Start Time 1315    SLP Stop Time  1400    SLP Time Calculation (min) 45 min    Activity Tolerance Patient tolerated treatment well             Past Medical History:  Diagnosis Date   ANXIETY 04/03/2007   Anxiety    ASTHMA 09/06/2008   Asthma    ASTHMATIC BRONCHITIS, ACUTE 10/25/2008   Bladder neck obstruction    CARPAL TUNNEL SYNDROME, BILATERAL 07/31/2007   issues resolved, no surgery   Cervical disc disease    Chronic bronchitis (HCC)    "get it about q yr" (02/12/2014)   COPD (chronic obstructive pulmonary disease) (HCC)    CORONARY ARTERY DISEASE 04/03/2007   DEPRESSION 04/03/2007   Depression    DIABETES MELLITUS, TYPE I 04/03/2007   Diabetic retinopathy associated with diabetes mellitus due to underlying condition (HCC) 04/03/2007   DM W/EYE MANIFESTATIONS, TYPE I, UNCONTROLLED 04/04/2007   DM W/RENAL MNFST, TYPE I, UNCONTROLLED 04/04/2007   ED (erectile dysfunction)    History of kidney stones    HYPERLIPIDEMIA 04/04/2007   Pneumonia    "several times and again today" (02/13/2104)   Renal insufficiency    Seizures (HCC)    "insulin seizure from time to time; none in the last couple years" (02/12/2014)   Spinal stenosis    Past Surgical History:  Procedure Laterality Date   ANTERIOR CERVICAL DECOMP/DISCECTOMY FUSION  2000   "couple screws and a plate"   ANTERIOR CERVICAL DECOMP/DISCECTOMY FUSION N/A 06/24/2016   Procedure: ANTERIOR CERVICAL DECOMPRESSION/DISCECTOMY FUSION CERVICAL FOUR - CERVICAL FIVE, CERVICAL FIVE - CERVICAL SIX; REMOVAL TETHER CERVICAL PLATE;  Surgeon: Shirlean Kelly, MD;  Location: [facility];   Service: Neurosurgery;  Laterality: N/A;  ANTERIOR CERVICAL DECOMPRESSION/DISCECTOMY FUSION CERVICAL FOUR - CERVICAL FIVE, CERVICAL FIVE - CERVICAL SIX; REMOVAL TETHER CERVICAL PLATE   APPENDECTOMY     BACK SURGERY     CARDIAC CATHETERIZATION  1990's   CATARACT EXTRACTION W/ INTRAOCULAR LENS  IMPLANT, BILATERAL Bilateral    CYSTOSCOPY WITH RETROGRADE PYELOGRAM, URETEROSCOPY AND STENT PLACEMENT Bilateral 04/06/2013   Procedure: BILATERAL CYSTOSCOPY WITH RETROGRADE PYELOGRAMS, STENT PLACEMENTS AND LEFT URETEROSCOPY AND STONE REMOVAL;  Surgeon: Sebastian Ache, MD;  Location: [facility];  Service: Urology;  Laterality: Bilateral;   CYSTOSCOPY WITH STENT PLACEMENT Right 04/12/2013   Procedure: CYSTOSCOPY WITH STENT PLACEMENT;  Surgeon: Sebastian Ache, MD;  [facility];  Service: Urology;  Laterality: Right;   CYSTOSCOPY/RETROGRADE/URETEROSCOPY Bilateral 04/12/2013   Procedure: CYSTOSCOPY/RETROGRADE/URETEROSCOPY;  Surgeon: Sebastian Ache, MD;  [facility]: [facility];  Service: Urology;  Laterality: Bilateral;  RIGHT RETROGRADE    HOLMIUM LASER APPLICATION Left 04/06/2013   Procedure: HOLMIUM LASER APPLICATION;  Surgeon: Sebastian Ache, MD;  Location: [facility];  Service: Urology;  Laterality: Left;   LEFT HEART CATH AND CORONARY ANGIOGRAPHY N/A 04/29/2020   Procedure: LEFT HEART CATH AND CORONARY ANGIOGRAPHY;  Surgeon: Elder Negus, MD;  [facility];  Service: Cardiovascular;  Laterality: N/A;   LUMBAR LAMINECTOMY/DECOMPRESSION MICRODISCECTOMY N/A 04/20/2019   Procedure: Lumbar microdisectomy and decompression L5-S1 left;  Surgeon:  Ranee Gosselin, MD;  Location: [facility];  Service: Orthopedics;  Laterality: N/A;    LYMPH NODE DISSECTION  ~ 1960   groin   stress cardiolite  09/06/2002   TONSILLECTOMY     VITRECTOMY Bilateral    Patient Active Problem List   Diagnosis Date Noted   Autoimmune thyroiditis 12/10/2020   Benign prostatic hyperplasia with lower urinary tract symptoms  12/10/2020   Diastolic dysfunction 12/10/2020   Occlusion and stenosis of bilateral carotid arteries 12/10/2020   Bronchiectasis with (acute) exacerbation (HCC) 12/06/2019   Medication management 11/09/2019   Seizure disorder, secondary (HCC) 07/19/2019   Abnormal CT of the chest 07/19/2019   Spinal stenosis, 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   Hypoglycemia due to insulin 04/20/2019   DM type 1 (diabetes mellitus, type 1) (HCC) 04/20/2019   Anemia 01/05/2019   Long term (current) use of insulin (HCC) 07/07/2017   HNP (herniated nucleus pulposus), cervical 06/24/2016   Syncope 05/20/2016   Tobacco abuse 05/20/2016   Cervical disc disorder with radiculopathy of cervical region 05/20/2016   Major depression, single episode 02/12/2015   Aspiration pneumonia (HCC) 02/12/2014   Sleep disorder 10/30/2012   Peripheral vascular disease (HCC) 06/25/2011   Gastroparesis 02/10/2011   Esophageal reflux 12/21/2010   TOBACCO USE, QUIT 05/08/2010   CONTUSION, RIGHT CHEST WALL 04/07/2010   Chronic asthmatic bronchitis 10/25/2008   Asthma, persistent controlled 09/06/2008   Cough 09/06/2008   SHOULDER PAIN, RIGHT 08/19/2008   BLADDER OUTLET OBSTRUCTION 04/15/2008   RASH AND OTHER NONSPECIFIC SKIN ERUPTION 02/21/2008   CARPAL TUNNEL SYNDROME, BILATERAL 07/31/2007   Allergic rhinitis 07/31/2007   RENAL INSUFFICIENCY 07/31/2007   OTHER TENOSYNOVITIS OF HAND AND WRIST 07/31/2007   DM W/RENAL MNFST, TYPE I, UNCONTROLLED 04/04/2007   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, uncontrolled, with macular edema, 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/04/2007   HLD (hyperlipidemia) 04/04/2007   DIABETIC RETINOPATHY, PROLIFERATIVE 04/03/2007   ANXIETY 04/03/2007   DEPRESSION 04/03/2007   TINNITUS 04/03/2007   CAD (coronary artery disease) 04/03/2007   DYSHIDROSIS 04/03/2007    ONSET DATE: 11/26/2022   REFERRING DIAG:  Z61.096E (ICD-10-CM) - Aspiration into airway, initial encounter     THERAPY DIAG:  Dysphagia, oropharyngeal phase - Plan: SLP modified barium swallow  Rationale for Evaluation and Treatment: Habilitation  SUBJECTIVE:   SUBJECTIVE STATEMENT: "Doing well, hope you are."  Pt accompanied by: self  PERTINENT HISTORY: Mr. Bartnik is know to use from prior clinical swallow evaluation s/p MBSS in 2018. However he did not return for therapy here after evaluation.   In 2018: Pt with modified (MBSS) due to aspirating hamburger and subsequent aspiration PNAPt with ACDF sx 06-24-16 C4-C5, C-5-C6 with removal of C6-C7 hardware placed in 2000. MBSS revealed silent aspiration with liquids due to impaired timing of laryngeal closure and oral transit. Dys III with thin liquids recommedned with whole meds in puree, slow rate, small sips/bites, multiple swallows, effortful swallow, "hock" and expecotrate, avoid particulate foods.   In 2021: Pt presents with a pharyngeal dysphagia due to incomplete epiglottic inversion over the laryngeal vestibule as well as impaired timing of laryngeal vestibule closure (LVC). There was intermittent, trace aspiration before the swallow, generally sensed by the pt and eliciting a cough response. Presence of ADCF hardware may contribute to impaired epiglottic closure, but timing issues are more difficult to explain. Pt attempted multiple strategies to improve airway protection, including head turns, tilts, and chin tuck in an effort to manipulate swallow physiology - none proved effective.  A mendelsohn maneuver - in which pt elevates larynx and maintains it in that position to lengthen duration of LVC - led to decreased quantity of aspiration. Results were discussed with pt in real time, and fluoroscopy provided feedback so that pt could modify strategies in real time. He agreed that OP SLP may provide some benefit in trialing other strategies (to work on timing, increasing duration of LVC). Coughing/throat-clearing appears to be exacerbating an already  irritated larynx. Recommend continuing current diet; avoid mixed consistencies (cereals, soups with solids and broths); attempt mendelsohn maneuver during initial ten swallows of liquid with each meal; attempt brief oral hold prior to swallow. Rec OP SLP. Pt agrees with plan. He did not attend outpt rehab with [place].     PAIN:  Are you having pain? No  FALLS: Has patient fallen in last 6 months?  No  LIVING ENVIRONMENT: Lives with: lives with their spouse Lives in: House/apartment  PLOF:  Level of assistance: Independent with ADLs, Independent with IADLs Employment: Retired  PATIENT GOALS: "been coughing a lot"--wants to decrease cough after eating liquids and solids.   OBJECTIVE:   DIAGNOSTIC FINDINGS: MBSS 2017, 2021-Pt presents with a pharyngeal dysphagia due to incomplete epiglottic inversion over the laryngeal vestibule as well as impaired timing of laryngeal vestibule closure (LVC). There was intermittent, trace aspiration before the swallow, generally sensed by the pt and eliciting a cough response. Presence of ADCF hardware may contribute to impaired epiglottic closure, but timing issues are more difficult to explain. Pt attempted multiple strategies to improve airway protection, including head turns, tilts, and chin tuck in an effort to manipulate swallow physiology - none proved effective. A mendelsohn maneuver - in which pt elevates larynx and maintains it in that position to lengthen duration of LVC - led to decreased quantity of aspiration. Results were discussed with pt in real time, and fluoroscopy provided feedback so that pt could modify strategies in real time. He agreed that OP SLP may provide some benefit in trialing other strategies (to work on timing, increasing duration of LVC). Coughing/throat-clearing appears to be exacerbating an already irritated larynx.   COGNITION: Overall cognitive status: Within functional limits for tasks assessed  CLINICAL SWALLOW ASSESSMENT:    Current diet: regular Dentition: missing dentition Patient directly observed with POs: Yes: thin liquids  Feeding: able to feed self Liquids provided by: cup Oral phase signs and symptoms:  N/A Pharyngeal phase signs and symptoms: suspected delayed swallow initiation, delayed throat clear, delayed cough, complaints of residue, and watery eyes  PATIENT REPORTED OUTCOME MEASURES (PROM): EAT-10: Patient rated swallow as impacting his life 18/40-stating most difficulty with coughing after eating, and lack of pleasure while eating.    TODAY'S TREATMENT:  DATE:    12/31/22: Patient was referred to ST two years ago, and had a previous swallow study, but states that he did not follow up on treatment because he was not feeling up to it. Patient has a history of pneumonia, but no history of acid reflex. He is currently avoiding soup, ice cream, and rice. Patient passed 3 oz water test, and was able to eat a solid food with delayed throat clearing. Cough presented as congested. Patient has a history of diabetes and asthma. New MBSS has been ordered for updated info, plan to follow up depending on MBSS findings.   PATIENT EDUCATION: Education details: See above  Person educated: Patient Education method: Explanation Education comprehension: verbalized understanding  LONG TERM GOALS: Target date: 02/11/23  Patient will adhere to diet modification with mod-I.  Baseline:  Goal status: INITIAL  2.  Patient will implement HEP for dysphagia with mod-I.  Baseline:  Goal status: INITIAL  3.  Patient will follow swallow precautions with mod-I  Baseline:  Goal status: INITIAL  ASSESSMENT:  CLINICAL IMPRESSION: Patient is a 67 y.o. M who was seen today for dysphagia evaluation. Based on PROM, patient rated swallow as 18/40, with delayed cough, and history of  pneumonia. Updated MBSS recommended due to change. ST recommendation pending--based on results from updated MBSS. Will follow up as needed.   OBJECTIVE IMPAIRMENTS: include dysphagia. These impairments are limiting patient from safety when swallowing. Factors affecting potential to achieve goals and functional outcome are cooperation/participation level. Patient will benefit from skilled SLP services to address above impairments and improve overall function.  REHAB POTENTIAL: Good   Retina Bernardy, Radene Journey, CCC-SLP 12/31/2022, 2:07 PM

## 2022-12-31 NOTE — Patient Instructions (Signed)
  Consider meds in applesauce or yogurt if that makes it easier   Be mindful and swallow with intent - limit distractions  You have to be more intentional when you eat out due to distractions at restaurants  Swallow hard each swallow  Modified barium swallow at [facility] - they will call to schedule  After the swallow test, call [phone] to schedule outpatient swallow therapy

## 2023-01-01 ENCOUNTER — Other Ambulatory Visit (HOSPITAL_COMMUNITY): Payer: Self-pay

## 2023-01-03 ENCOUNTER — Telehealth (HOSPITAL_COMMUNITY): Payer: Self-pay | Admitting: *Deleted

## 2023-01-03 NOTE — Telephone Encounter (Signed)
Attempted to contact patient to schedule OP MBS. Left VM. RKEEL 

## 2023-01-03 NOTE — Addendum Note (Signed)
Addended by: Ellen Henri on: 01/03/2023 08:57 AM   Modules accepted: Orders

## 2023-01-06 ENCOUNTER — Other Ambulatory Visit (HOSPITAL_COMMUNITY): Payer: Self-pay

## 2023-01-10 ENCOUNTER — Other Ambulatory Visit (HOSPITAL_COMMUNITY): Payer: Self-pay

## 2023-01-10 ENCOUNTER — Telehealth (HOSPITAL_COMMUNITY): Payer: Self-pay | Admitting: *Deleted

## 2023-01-10 MED ORDER — TAMSULOSIN HCL 0.4 MG PO CAPS
0.4000 mg | ORAL_CAPSULE | Freq: Every day | ORAL | 4 refills | Status: DC
  Filled 2023-01-10 – 2023-03-05 (×2): qty 90, 90d supply, fill #0
  Filled 2023-06-13: qty 90, 90d supply, fill #1
  Filled 2023-09-13 (×2): qty 90, 90d supply, fill #2
  Filled 2023-11-25: qty 90, 90d supply, fill #3

## 2023-01-10 MED ORDER — PANTOPRAZOLE SODIUM 40 MG PO TBEC
40.0000 mg | DELAYED_RELEASE_TABLET | Freq: Every day | ORAL | 12 refills | Status: DC
  Filled 2023-01-10: qty 90, 90d supply, fill #0
  Filled 2023-05-15: qty 90, 90d supply, fill #1
  Filled 2023-09-01: qty 90, 90d supply, fill #2
  Filled 2023-11-16: qty 90, 90d supply, fill #3

## 2023-01-10 NOTE — Telephone Encounter (Signed)
Attempted to contact patient to schedule OP MBS. Left VM. RKEEL 

## 2023-01-14 ENCOUNTER — Other Ambulatory Visit (HOSPITAL_COMMUNITY): Payer: Self-pay

## 2023-01-14 MED ORDER — HYDROCODONE-ACETAMINOPHEN 5-325 MG PO TABS
1.0000 | ORAL_TABLET | Freq: Three times a day (TID) | ORAL | 0 refills | Status: DC | PRN
  Filled 2023-01-14: qty 60, 20d supply, fill #0

## 2023-01-14 MED ORDER — VARENICLINE TARTRATE 1 MG PO TABS
1.0000 mg | ORAL_TABLET | Freq: Every day | ORAL | 12 refills | Status: DC
  Filled 2023-01-14: qty 30, 30d supply, fill #0

## 2023-01-14 MED ORDER — AMOXICILLIN-POT CLAVULANATE 875-125 MG PO TABS
1.0000 | ORAL_TABLET | Freq: Two times a day (BID) | ORAL | 0 refills | Status: AC
  Filled 2023-01-14: qty 14, 7d supply, fill #0

## 2023-01-14 MED ORDER — CLINDAMYCIN HCL 150 MG PO CAPS
150.0000 mg | ORAL_CAPSULE | Freq: Three times a day (TID) | ORAL | 0 refills | Status: DC
  Filled 2023-01-14: qty 30, 10d supply, fill #0

## 2023-01-15 ENCOUNTER — Other Ambulatory Visit (HOSPITAL_COMMUNITY): Payer: Self-pay

## 2023-01-15 MED ORDER — CLINDAMYCIN HCL 150 MG PO CAPS
150.0000 mg | ORAL_CAPSULE | Freq: Three times a day (TID) | ORAL | 0 refills | Status: DC
  Filled 2023-01-15: qty 30, 10d supply, fill #0

## 2023-01-15 MED ORDER — ACETAMINOPHEN-CODEINE 300-30 MG PO TABS
1.0000 | ORAL_TABLET | ORAL | 0 refills | Status: DC | PRN
  Filled 2023-01-15: qty 12, 2d supply, fill #0

## 2023-01-17 ENCOUNTER — Telehealth (HOSPITAL_COMMUNITY): Payer: Self-pay | Admitting: *Deleted

## 2023-01-17 NOTE — Telephone Encounter (Signed)
3rd and final attempt to contact patient to schedule OP MBS. Left VM. RKEEL

## 2023-01-21 ENCOUNTER — Other Ambulatory Visit (HOSPITAL_COMMUNITY): Payer: Self-pay

## 2023-01-24 ENCOUNTER — Telehealth (HOSPITAL_COMMUNITY): Payer: Self-pay | Admitting: *Deleted

## 2023-01-24 NOTE — Telephone Encounter (Signed)
Unable to contact patient x 3, will close order at this time. RKEEL

## 2023-01-27 ENCOUNTER — Telehealth: Payer: Self-pay | Admitting: Pulmonary Disease

## 2023-01-27 ENCOUNTER — Encounter: Payer: Self-pay | Admitting: Speech Pathology

## 2023-01-27 ENCOUNTER — Other Ambulatory Visit (HOSPITAL_COMMUNITY): Payer: Self-pay

## 2023-01-27 DIAGNOSIS — R1314 Dysphagia, pharyngoesophageal phase: Secondary | ICD-10-CM

## 2023-01-27 MED ORDER — PROMETHAZINE HCL 25 MG PO TABS
25.0000 mg | ORAL_TABLET | Freq: Three times a day (TID) | ORAL | 1 refills | Status: DC | PRN
  Filled 2023-01-27: qty 90, 30d supply, fill #0

## 2023-01-27 NOTE — Therapy (Signed)
[facility] [address] [place], [place], [address] Phone: [phone]   Fax:  [phone]  Patient Details  Name: James [name] MRN: [id number] Date of Birth: 1956-01-11 Referring Provider:  No ref. provider found  Encounter Date: 01/27/2023  Good afternoon Dr. Vassie Loll,   Pt came to clinic reporting inability to schedule MBSS. SLP reviewed patient's chart and determined order had been closed d/t 3 unsuccessful attempts to contact pt. SLP advised pt that she would reach out to referring provider, as I am unable to place new order without having an active visit with pt. Pt endorses he is still interested in instrumental swallow study and subsequent ST. If you still agree with pt receiving MBSS to assess his swallow function, please place order via Epic for MBSS at [facility].   Thank you,  Maia Breslow, CCC-SLP 01/27/2023, 3:11 PM  [facility] [address] [name], [place], [address] Phone: [phone]   Fax:  [phone]

## 2023-01-27 NOTE — Telephone Encounter (Signed)
Please place order for swallow eval with speech therapy

## 2023-02-01 ENCOUNTER — Other Ambulatory Visit (HOSPITAL_COMMUNITY): Payer: Self-pay

## 2023-02-02 ENCOUNTER — Other Ambulatory Visit (HOSPITAL_COMMUNITY): Payer: Self-pay

## 2023-02-02 ENCOUNTER — Other Ambulatory Visit: Payer: Self-pay

## 2023-02-02 DIAGNOSIS — R1314 Dysphagia, pharyngoesophageal phase: Secondary | ICD-10-CM

## 2023-02-02 NOTE — Telephone Encounter (Signed)
This has been ordered.Noting else further needed.

## 2023-02-04 ENCOUNTER — Other Ambulatory Visit: Payer: Self-pay

## 2023-02-04 ENCOUNTER — Other Ambulatory Visit (HOSPITAL_COMMUNITY): Payer: Self-pay

## 2023-02-04 DIAGNOSIS — J690 Pneumonitis due to inhalation of food and vomit: Secondary | ICD-10-CM

## 2023-02-04 MED ORDER — GABAPENTIN 300 MG PO CAPS
300.0000 mg | ORAL_CAPSULE | Freq: Two times a day (BID) | ORAL | 3 refills | Status: DC | PRN
  Filled 2023-02-04: qty 180, 90d supply, fill #0

## 2023-02-07 ENCOUNTER — Other Ambulatory Visit (HOSPITAL_COMMUNITY): Payer: Self-pay | Admitting: *Deleted

## 2023-02-07 ENCOUNTER — Other Ambulatory Visit (HOSPITAL_COMMUNITY): Payer: Self-pay

## 2023-02-07 DIAGNOSIS — R059 Cough, unspecified: Secondary | ICD-10-CM

## 2023-02-07 DIAGNOSIS — R1310 Dysphagia, unspecified: Secondary | ICD-10-CM

## 2023-02-09 ENCOUNTER — Other Ambulatory Visit (HOSPITAL_COMMUNITY): Payer: Self-pay

## 2023-02-14 ENCOUNTER — Ambulatory Visit (HOSPITAL_COMMUNITY)
Admission: RE | Admit: 2023-02-14 | Discharge: 2023-02-14 | Disposition: A | Discharge status: Home / Self Care | Payer: Medicare Other | Source: Ambulatory Visit | Attending: Endocrinology | Admitting: Endocrinology

## 2023-02-14 ENCOUNTER — Other Ambulatory Visit (HOSPITAL_COMMUNITY): Payer: Self-pay

## 2023-02-14 ENCOUNTER — Other Ambulatory Visit: Payer: Self-pay

## 2023-02-14 DIAGNOSIS — J690 Pneumonitis due to inhalation of food and vomit: Secondary | ICD-10-CM | POA: Diagnosis present

## 2023-02-14 DIAGNOSIS — R059 Cough, unspecified: Secondary | ICD-10-CM

## 2023-02-14 DIAGNOSIS — R1313 Dysphagia, pharyngeal phase: Secondary | ICD-10-CM | POA: Insufficient documentation

## 2023-02-14 DIAGNOSIS — R1310 Dysphagia, unspecified: Secondary | ICD-10-CM
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
## 2023-02-17 ENCOUNTER — Ambulatory Visit (HOSPITAL_BASED_OUTPATIENT_CLINIC_OR_DEPARTMENT_OTHER): Payer: Medicare Other | Admitting: Pulmonary Disease

## 2023-02-23 ENCOUNTER — Other Ambulatory Visit (HOSPITAL_COMMUNITY): Payer: Self-pay

## 2023-02-23 MED ORDER — AMOXICILLIN 500 MG PO CAPS
ORAL_CAPSULE | ORAL | 0 refills | Status: DC
  Filled 2023-02-23: qty 28, 7d supply, fill #0

## 2023-03-03 ENCOUNTER — Other Ambulatory Visit (HOSPITAL_COMMUNITY): Payer: Self-pay

## 2023-03-03 MED ORDER — HYDROCODONE-ACETAMINOPHEN 5-325 MG PO TABS
1.0000 | ORAL_TABLET | ORAL | 0 refills | Status: DC | PRN
  Filled 2023-03-03: qty 14, 3d supply, fill #0

## 2023-03-03 MED ORDER — PENICILLIN V POTASSIUM 500 MG PO TABS
500.0000 mg | ORAL_TABLET | Freq: Four times a day (QID) | ORAL | 0 refills | Status: DC
  Filled 2023-03-03: qty 28, 7d supply, fill #0

## 2023-03-04 ENCOUNTER — Other Ambulatory Visit (HOSPITAL_COMMUNITY): Payer: Self-pay

## 2023-03-04 MED ORDER — INSULIN LISPRO (1 UNIT DIAL) 100 UNIT/ML (KWIKPEN)
100.0000 [IU] | PEN_INJECTOR | Freq: Every day | SUBCUTANEOUS | 12 refills | Status: DC
Start: 2023-03-04 — End: 2023-03-29
  Filled 2023-03-04: qty 45, 45d supply, fill #0
  Filled 2023-03-12: qty 30, 30d supply, fill #0

## 2023-03-05 ENCOUNTER — Other Ambulatory Visit (HOSPITAL_COMMUNITY): Payer: Self-pay

## 2023-03-05 MED ORDER — LEVOTHYROXINE SODIUM 100 MCG PO TABS
100.0000 ug | ORAL_TABLET | Freq: Every day | ORAL | 4 refills | Status: DC
  Filled 2023-03-05: qty 90, 90d supply, fill #0
  Filled 2023-06-13: qty 90, 90d supply, fill #1
  Filled 2023-09-19: qty 90, 90d supply, fill #2
  Filled 2023-12-30: qty 90, 90d supply, fill #3

## 2023-03-08 ENCOUNTER — Other Ambulatory Visit (HOSPITAL_COMMUNITY): Payer: Self-pay

## 2023-03-09 ENCOUNTER — Other Ambulatory Visit (HOSPITAL_COMMUNITY): Payer: Self-pay

## 2023-03-10 ENCOUNTER — Other Ambulatory Visit (HOSPITAL_COMMUNITY): Payer: Self-pay

## 2023-03-10 MED ORDER — HYDROCODONE-ACETAMINOPHEN 5-325 MG PO TABS
1.0000 | ORAL_TABLET | Freq: Three times a day (TID) | ORAL | 0 refills | Status: AC | PRN
  Filled 2023-03-10: qty 60, 30d supply, fill #0

## 2023-03-10 MED ORDER — NOVOLOG FLEXPEN 100 UNIT/ML ~~LOC~~ SOPN
100.0000 [IU] | PEN_INJECTOR | Freq: Every day | SUBCUTANEOUS | 3 refills | Status: DC
  Filled 2023-03-10 – 2023-03-11 (×3): qty 30, 30d supply, fill #0
  Filled 2023-03-12: qty 30, 15d supply, fill #0
  Filled 2023-03-14: qty 30, 30d supply, fill #0

## 2023-03-11 ENCOUNTER — Other Ambulatory Visit: Payer: Self-pay

## 2023-03-11 ENCOUNTER — Other Ambulatory Visit (HOSPITAL_COMMUNITY): Payer: Self-pay

## 2023-03-12 ENCOUNTER — Other Ambulatory Visit (HOSPITAL_COMMUNITY): Payer: Self-pay

## 2023-03-14 ENCOUNTER — Other Ambulatory Visit (HOSPITAL_COMMUNITY): Payer: Self-pay

## 2023-03-22 ENCOUNTER — Other Ambulatory Visit: Payer: Self-pay

## 2023-03-22 ENCOUNTER — Other Ambulatory Visit (HOSPITAL_COMMUNITY): Payer: Self-pay

## 2023-03-22 MED ORDER — FINASTERIDE 5 MG PO TABS
5.0000 mg | ORAL_TABLET | Freq: Every day | ORAL | 3 refills | Status: DC
Start: 2023-03-22 — End: 2024-02-23
  Filled 2023-03-22: qty 90, 90d supply, fill #0
  Filled 2023-06-13: qty 90, 90d supply, fill #1
  Filled 2023-09-13: qty 90, 90d supply, fill #2
  Filled 2023-12-12: qty 90, 90d supply, fill #3

## 2023-03-23 ENCOUNTER — Other Ambulatory Visit (HOSPITAL_COMMUNITY): Payer: Self-pay

## 2023-03-23 ENCOUNTER — Other Ambulatory Visit: Payer: Self-pay

## 2023-03-23 MED ORDER — FIASP FLEXTOUCH 100 UNIT/ML ~~LOC~~ SOPN
PEN_INJECTOR | SUBCUTANEOUS | 3 refills | Status: DC
Start: 2023-03-23 — End: 2024-04-18
  Filled 2023-03-23: qty 30, 30d supply, fill #0
  Filled 2023-04-18: qty 30, 30d supply, fill #1
  Filled 2023-05-25 – 2023-06-16 (×3): qty 30, 30d supply, fill #2

## 2023-03-24 ENCOUNTER — Other Ambulatory Visit (HOSPITAL_COMMUNITY): Payer: Self-pay

## 2023-03-25 ENCOUNTER — Telehealth: Payer: Self-pay | Admitting: Pulmonary Disease

## 2023-03-25 ENCOUNTER — Other Ambulatory Visit (HOSPITAL_BASED_OUTPATIENT_CLINIC_OR_DEPARTMENT_OTHER): Payer: Self-pay | Admitting: Pulmonary Disease

## 2023-03-25 ENCOUNTER — Other Ambulatory Visit (HOSPITAL_COMMUNITY): Payer: Self-pay

## 2023-03-25 MED FILL — Albuterol Sulfate Inhal Aero 108 MCG/ACT (90MCG Base Equiv): RESPIRATORY_TRACT | 28 days supply | Qty: 6.7 | Fill #0 | Status: AC

## 2023-03-25 NOTE — Telephone Encounter (Signed)
Patient needs a refill on his rescue inhaler; ventalin  He has misplaced it.   Pharmacy [facility]

## 2023-03-25 NOTE — Telephone Encounter (Signed)
Rx sent to pharmacy   

## 2023-03-26 ENCOUNTER — Other Ambulatory Visit (HOSPITAL_COMMUNITY): Payer: Self-pay

## 2023-03-29 ENCOUNTER — Ambulatory Visit (HOSPITAL_BASED_OUTPATIENT_CLINIC_OR_DEPARTMENT_OTHER): Payer: Medicare Other | Admitting: Pulmonary Disease

## 2023-03-29 ENCOUNTER — Other Ambulatory Visit (HOSPITAL_COMMUNITY): Payer: Self-pay

## 2023-03-29 ENCOUNTER — Encounter (HOSPITAL_BASED_OUTPATIENT_CLINIC_OR_DEPARTMENT_OTHER): Payer: Self-pay | Admitting: Pulmonary Disease

## 2023-03-29 VITALS — BP 124/68 | HR 86 | Resp 18 | Ht 69.0 in | Wt 175.5 lb

## 2023-03-29 DIAGNOSIS — J45998 Other asthma: Secondary | ICD-10-CM

## 2023-03-29 DIAGNOSIS — J471 Bronchiectasis with (acute) exacerbation: Secondary | ICD-10-CM

## 2023-03-29 MED ORDER — CEFDINIR 300 MG PO CAPS
300.0000 mg | ORAL_CAPSULE | Freq: Two times a day (BID) | ORAL | 0 refills | Status: DC
  Filled 2023-03-29: qty 14, 7d supply, fill #0

## 2023-03-29 MED ORDER — HYDROCODONE-ACETAMINOPHEN 5-325 MG PO TABS
1.0000 | ORAL_TABLET | Freq: Three times a day (TID) | ORAL | 0 refills | Status: DC | PRN
  Filled 2023-03-30 – 2023-04-09 (×2): qty 60, 30d supply, fill #0

## 2023-03-29 MED ORDER — GUAIFENESIN-CODEINE 100-10 MG/5ML PO SYRP
5.0000 mL | ORAL_SOLUTION | Freq: Three times a day (TID) | ORAL | 0 refills | Status: DC | PRN
  Filled 2023-03-29: qty 120, 8d supply, fill #0

## 2023-03-29 MED ORDER — PREDNISONE 10 MG PO TABS
ORAL_TABLET | ORAL | 0 refills | Status: AC
  Filled 2023-03-29: qty 40, 16d supply, fill #0

## 2023-03-29 NOTE — Patient Instructions (Signed)
X Rx for omnicef 300 twice daily x 7 days  X Prednisone 10 mg tabs Take 4 tabs  daily with food x 4 days, then 3 tabs daily x 4 days, then 2 tabs daily x 4 days, then 1 tab daily x4 days then stop. #40

## 2023-03-29 NOTE — Progress Notes (Signed)
   Subjective:    Patient ID: James [name], male    DOB: 1956/07/13, 67 y.o.   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 CTA negative for PE    Meds-unable to get Dulera due to insurance issues, on Advair, Symbicort was  expensive  24-month follow-up visit He was in his usual state of health until 1 Wigger ago when he suddenly developed green sputum production, body aches he feels he is having an infective exacerbation Denies any obvious aspiration episode. Reviewed speech evaluation 11/2022 which showed mild aspiration risk but did not place any limitations on his swallowing or change his diet. He reports aspiration with thin liquids such as soup or with rice   Significant tests/ events reviewed   LDCT 06/2020 >> 3 mm nodules   Swallow eval >> pharyngeal dysphagia due to incomplete epiglottic inversion over the laryngeal vestibule as well as impaired timing of laryngeal vestibule closure ,Presence of ADCF hardware may contribute .A mendelsohn maneuver - in which pt elevates larynx and maintains it in that position to lengthen duration of LVC - led to decreased quantity of aspiration   LDCT chest 06/2022 RADS 2, 5 mm nodules   HRCT chest 11/2019 bilateral lower lobe  bronchiectasis, no ILD, New airspace consolidation in the left lower lobe, Tracheobronchomalacia.     Ct chest 01/2014 - left volume loss and consolidation left lower lobe-rib fractures   Esophagram 10/31/19 >> Frank tracheal aspiration of thick barium during the pharyngeal phase of swallowing, causing cough response ,Widely patent distal esophageal mucosal ring   Spirometry 2012 >> FEV1 1.94 (52%) PFTs 05/2015 ratio 74, FEV1 72%, no BD response, mild restriction   PFTs 09/2018 -ratio 75, FEV1 70%, FVC 70%, no bronchodilator response  Review of Systems neg for any significant sore throat, dysphagia, itching, sneezing, nasal congestion or excess/ purulent secretions, fever, chills, sweats, unintended wt loss, pleuritic or exertional cp, hempoptysis, orthopnea pnd or change in chronic leg swelling. Also denies presyncope, palpitations, heartburn, abdominal pain, nausea, vomiting, diarrhea or change in bowel or urinary habits, dysuria,hematuria, rash, arthralgias, visual complaints, headache, numbness weakness or ataxia.     Objective:   Physical Exam  Gen. Pleasant, well-nourished, in no distress ENT - no thrush, no pallor/icterus,no post nasal drip Neck: No JVD, no thyromegaly, no carotid bruits Lungs: no use of accessory muscles, no dullness to percussion, bilateral breath sounds without rhonchi Cardiovascular: Rhythm regular, heart sounds  normal, no murmurs or gallops, no peripheral edema Musculoskeletal: No deformities, no cyanosis or clubbing        Assessment & Plan:

## 2023-03-29 NOTE — Assessment & Plan Note (Addendum)
We will treat him as a usual exacerbation with antibiotics and steroids. Will use Omnicef this time, hide you used Augmentin previously and Z-Pak. Will also provide him cough syrup for symptomatic relief Possible that this is related to recurrent aspiration episodes

## 2023-03-29 NOTE — Assessment & Plan Note (Signed)
He will continue on Breztri, he admits to poor compliance with this

## 2023-03-30 ENCOUNTER — Other Ambulatory Visit (HOSPITAL_COMMUNITY): Payer: Self-pay

## 2023-03-30 ENCOUNTER — Telehealth: Payer: Self-pay | Admitting: Pulmonary Disease

## 2023-03-30 MED ORDER — DOXYCYCLINE HYCLATE 100 MG PO TABS
100.0000 mg | ORAL_TABLET | Freq: Two times a day (BID) | ORAL | 0 refills | Status: DC
  Filled 2023-03-30: qty 14, 7d supply, fill #0

## 2023-03-30 NOTE — Telephone Encounter (Signed)
PT saw Dr. Vassie Loll on 9/10. He was given three RX's to fill.  Pt calling today because he went to his PCP today. They did a xray and "could not rule out" Covid pneumonia. They added Doxycycline to his medications and told him to call Dr. Vassie Loll and make him aware of this addition.   Covid test today was negative.

## 2023-04-01 NOTE — Telephone Encounter (Signed)
Sending as a Financial planner

## 2023-04-07 NOTE — Telephone Encounter (Signed)
This encounter was created in error - please disregard.

## 2023-04-08 NOTE — Telephone Encounter (Signed)
NFN

## 2023-04-09 ENCOUNTER — Other Ambulatory Visit (HOSPITAL_COMMUNITY): Payer: Self-pay

## 2023-04-28 ENCOUNTER — Other Ambulatory Visit (HOSPITAL_COMMUNITY): Payer: Self-pay

## 2023-04-28 MED ORDER — PROMETHAZINE HCL 25 MG PO TABS
25.0000 mg | ORAL_TABLET | Freq: Three times a day (TID) | ORAL | 1 refills | Status: DC | PRN
  Filled 2023-04-28: qty 90, 30d supply, fill #0
  Filled 2024-04-09: qty 90, 30d supply, fill #1

## 2023-04-29 ENCOUNTER — Inpatient Hospital Stay (HOSPITAL_COMMUNITY)
Admission: EM | Admit: 2023-04-29 | Discharge: 2023-05-01 | DRG: 193 | Disposition: A | Discharge status: Home / Self Care | Payer: Medicare Other | Attending: Internal Medicine | Admitting: Internal Medicine

## 2023-04-29 ENCOUNTER — Emergency Department (HOSPITAL_COMMUNITY): Payer: Medicare Other

## 2023-04-29 ENCOUNTER — Encounter (HOSPITAL_COMMUNITY): Payer: Self-pay

## 2023-04-29 ENCOUNTER — Other Ambulatory Visit: Payer: Self-pay

## 2023-04-29 DIAGNOSIS — E039 Hypothyroidism, unspecified: Secondary | ICD-10-CM

## 2023-04-29 DIAGNOSIS — G8929 Other chronic pain: Secondary | ICD-10-CM

## 2023-04-29 DIAGNOSIS — J9601 Acute respiratory failure with hypoxia: Secondary | ICD-10-CM

## 2023-04-29 DIAGNOSIS — Z9842 Cataract extraction status, left eye: Secondary | ICD-10-CM

## 2023-04-29 DIAGNOSIS — Z880 Allergy status to penicillin: Secondary | ICD-10-CM

## 2023-04-29 DIAGNOSIS — Z961 Presence of intraocular lens: Secondary | ICD-10-CM | POA: Diagnosis present

## 2023-04-29 DIAGNOSIS — Z9841 Cataract extraction status, right eye: Secondary | ICD-10-CM

## 2023-04-29 DIAGNOSIS — Z7982 Long term (current) use of aspirin: Secondary | ICD-10-CM

## 2023-04-29 DIAGNOSIS — E785 Hyperlipidemia, unspecified: Secondary | ICD-10-CM | POA: Diagnosis present

## 2023-04-29 DIAGNOSIS — Z1152 Encounter for screening for COVID-19: Secondary | ICD-10-CM

## 2023-04-29 DIAGNOSIS — Z79890 Hormone replacement therapy (postmenopausal): Secondary | ICD-10-CM

## 2023-04-29 DIAGNOSIS — Z23 Encounter for immunization: Secondary | ICD-10-CM

## 2023-04-29 DIAGNOSIS — E10319 Type 1 diabetes mellitus with unspecified diabetic retinopathy without macular edema: Secondary | ICD-10-CM | POA: Diagnosis present

## 2023-04-29 DIAGNOSIS — Z79899 Other long term (current) drug therapy: Secondary | ICD-10-CM

## 2023-04-29 DIAGNOSIS — Z8249 Family history of ischemic heart disease and other diseases of the circulatory system: Secondary | ICD-10-CM

## 2023-04-29 DIAGNOSIS — J189 Pneumonia, unspecified organism: Principal | ICD-10-CM | POA: Diagnosis present

## 2023-04-29 DIAGNOSIS — E1059 Type 1 diabetes mellitus with other circulatory complications: Secondary | ICD-10-CM

## 2023-04-29 DIAGNOSIS — Z794 Long term (current) use of insulin: Secondary | ICD-10-CM

## 2023-04-29 DIAGNOSIS — I2510 Atherosclerotic heart disease of native coronary artery without angina pectoris: Secondary | ICD-10-CM | POA: Diagnosis present

## 2023-04-29 DIAGNOSIS — E109 Type 1 diabetes mellitus without complications: Secondary | ICD-10-CM | POA: Diagnosis present

## 2023-04-29 DIAGNOSIS — J209 Acute bronchitis, unspecified: Secondary | ICD-10-CM | POA: Diagnosis present

## 2023-04-29 DIAGNOSIS — Z981 Arthrodesis status: Secondary | ICD-10-CM

## 2023-04-29 DIAGNOSIS — Z7951 Long term (current) use of inhaled steroids: Secondary | ICD-10-CM

## 2023-04-29 DIAGNOSIS — J45901 Unspecified asthma with (acute) exacerbation: Principal | ICD-10-CM | POA: Diagnosis present

## 2023-04-29 DIAGNOSIS — Z87891 Personal history of nicotine dependence: Secondary | ICD-10-CM

## 2023-04-29 DIAGNOSIS — N401 Enlarged prostate with lower urinary tract symptoms: Secondary | ICD-10-CM | POA: Diagnosis present

## 2023-04-29 DIAGNOSIS — Z888 Allergy status to other drugs, medicaments and biological substances status: Secondary | ICD-10-CM

## 2023-04-29 LAB — CBC WITH DIFFERENTIAL/PLATELET
Abs Immature Granulocytes: 0.02 10*3/uL (ref 0.00–0.07)
Basophils Absolute: 0 10*3/uL (ref 0.0–0.1)
Basophils Relative: 1 %
Eosinophils Absolute: 0.3 10*3/uL (ref 0.0–0.5)
Eosinophils Relative: 3 %
HCT: 39.6 % (ref 39.0–52.0)
Hemoglobin: 13.9 g/dL (ref 13.0–17.0)
Immature Granulocytes: 0 %
Lymphocytes Relative: 12 %
Lymphs Abs: 1 10*3/uL (ref 0.7–4.0)
MCH: 31.9 pg (ref 26.0–34.0)
MCHC: 35.1 g/dL (ref 30.0–36.0)
MCV: 90.8 fL (ref 80.0–100.0)
Monocytes Absolute: 0.7 10*3/uL (ref 0.1–1.0)
Monocytes Relative: 8 %
Neutro Abs: 6.6 10*3/uL (ref 1.7–7.7)
Neutrophils Relative %: 76 %
Platelets: 235 10*3/uL (ref 150–400)
RBC: 4.36 MIL/uL (ref 4.22–5.81)
RDW: 12.4 % (ref 11.5–15.5)
WBC: 8.7 10*3/uL (ref 4.0–10.5)
nRBC: 0 % (ref 0.0–0.2)

## 2023-04-29 LAB — CBG MONITORING, ED: Glucose-Capillary: 269 mg/dL — ABNORMAL HIGH (ref 70–99)

## 2023-04-29 LAB — COMPREHENSIVE METABOLIC PANEL
ALT: 16 U/L (ref 0–44)
AST: 11 U/L — ABNORMAL LOW (ref 15–41)
Albumin: 3.6 g/dL (ref 3.5–5.0)
Alkaline Phosphatase: 87 U/L (ref 38–126)
Anion gap: 8 (ref 5–15)
BUN: 13 mg/dL (ref 8–23)
CO2: 23 mmol/L (ref 22–32)
Calcium: 8.3 mg/dL — ABNORMAL LOW (ref 8.9–10.3)
Chloride: 104 mmol/L (ref 98–111)
Creatinine, Ser: 0.73 mg/dL (ref 0.61–1.24)
GFR, Estimated: >60 mL/min (ref >60)
Glucose, Bld: 232 mg/dL — ABNORMAL HIGH (ref 70–99)
Potassium: 3.7 mmol/L (ref 3.5–5.1)
Sodium: 135 mmol/L (ref 135–145)
Total Bilirubin: 1.2 mg/dL (ref 0.3–1.2)
Total Protein: 6.5 g/dL (ref 6.5–8.1)

## 2023-04-29 LAB — I-STAT CG4 LACTIC ACID, ED: Lactic Acid, Venous: 0.9 mmol/L (ref 0.5–1.9)

## 2023-04-29 LAB — SARS CORONAVIRUS 2 BY RT PCR: SARS Coronavirus 2 by RT PCR: NEGATIVE

## 2023-04-29 MED ORDER — TAMSULOSIN HCL 0.4 MG PO CAPS
0.4000 mg | ORAL_CAPSULE | Freq: Every day | ORAL | Status: DC
  Administered 2023-04-30 – 2023-05-01 (×2): 0.4 mg via ORAL
  Filled 2023-04-29 (×2): qty 1

## 2023-04-29 MED ORDER — ONDANSETRON HCL 4 MG PO TABS: 4.0000 mg | ORAL_TABLET | Freq: Four times a day (QID) | ORAL | Status: DC | PRN

## 2023-04-29 MED ORDER — METHYLPREDNISOLONE SODIUM SUCC 125 MG IJ SOLR
125.0000 mg | Freq: Once | INTRAMUSCULAR | Status: AC
  Administered 2023-04-29: 125 mg via INTRAVENOUS
  Filled 2023-04-29: qty 2

## 2023-04-29 MED ORDER — ONDANSETRON HCL 4 MG/2ML IJ SOLN
4.0000 mg | Freq: Four times a day (QID) | INTRAMUSCULAR | Status: DC | PRN
  Administered 2023-05-01: 4 mg via INTRAVENOUS
  Filled 2023-04-29: qty 2

## 2023-04-29 MED ORDER — SENNOSIDES-DOCUSATE SODIUM 8.6-50 MG PO TABS: 1.0000 | ORAL_TABLET | Freq: Every evening | ORAL | Status: DC | PRN

## 2023-04-29 MED ORDER — ACETAMINOPHEN 325 MG PO TABS: 650.0000 mg | ORAL_TABLET | Freq: Four times a day (QID) | ORAL | Status: DC | PRN

## 2023-04-29 MED ORDER — INSULIN GLARGINE-YFGN 100 UNIT/ML ~~LOC~~ SOLN
10.0000 [IU] | Freq: Every day | SUBCUTANEOUS | Status: DC
  Administered 2023-04-30 – 2023-05-01 (×2): 10 [IU] via SUBCUTANEOUS
  Filled 2023-04-29 (×2): qty 0.1

## 2023-04-29 MED ORDER — ASPIRIN 81 MG PO TBEC
81.0000 mg | DELAYED_RELEASE_TABLET | Freq: Every day | ORAL | Status: DC
  Administered 2023-04-29 – 2023-04-30 (×2): 81 mg via ORAL
  Filled 2023-04-29 (×2): qty 1

## 2023-04-29 MED ORDER — ALBUTEROL SULFATE (2.5 MG/3ML) 0.083% IN NEBU
10.0000 mg/h | INHALATION_SOLUTION | Freq: Once | RESPIRATORY_TRACT | Status: AC
  Administered 2023-04-29: 10 mg/h via RESPIRATORY_TRACT
  Filled 2023-04-29: qty 9
  Filled 2023-04-29: qty 3
  Filled 2023-04-29: qty 12

## 2023-04-29 MED ORDER — ALBUTEROL SULFATE HFA 108 (90 BASE) MCG/ACT IN AERS: 2.0000 | INHALATION_SPRAY | RESPIRATORY_TRACT | Status: DC | PRN

## 2023-04-29 MED ORDER — SODIUM CHLORIDE 0.9 % IV SOLN
500.0000 mg | INTRAVENOUS | Status: DC
  Administered 2023-04-29 – 2023-05-01 (×2): 500 mg via INTRAVENOUS
  Filled 2023-04-29 (×2): qty 5

## 2023-04-29 MED ORDER — METHYLPREDNISOLONE SODIUM SUCC 40 MG IJ SOLR
40.0000 mg | Freq: Two times a day (BID) | INTRAMUSCULAR | Status: DC
  Administered 2023-04-30 – 2023-05-01 (×3): 40 mg via INTRAVENOUS
  Filled 2023-04-29 (×3): qty 1

## 2023-04-29 MED ORDER — BUDESONIDE 0.25 MG/2ML IN SUSP
0.2500 mg | Freq: Two times a day (BID) | RESPIRATORY_TRACT | Status: DC
  Administered 2023-04-29 – 2023-05-01 (×4): 0.25 mg via RESPIRATORY_TRACT
  Filled 2023-04-29 (×4): qty 2

## 2023-04-29 MED ORDER — GUAIFENESIN ER 600 MG PO TB12
600.0000 mg | ORAL_TABLET | Freq: Two times a day (BID) | ORAL | Status: DC
  Administered 2023-04-29 – 2023-05-01 (×4): 600 mg via ORAL
  Filled 2023-04-29 (×4): qty 1

## 2023-04-29 MED ORDER — LEVOTHYROXINE SODIUM 100 MCG PO TABS
100.0000 ug | ORAL_TABLET | Freq: Every day | ORAL | Status: DC
  Administered 2023-04-30 – 2023-05-01 (×2): 100 ug via ORAL
  Filled 2023-04-29 (×2): qty 1

## 2023-04-29 MED ORDER — SODIUM CHLORIDE 0.9 % IV SOLN
1.0000 g | INTRAVENOUS | Status: DC
  Administered 2023-04-29 – 2023-04-30 (×2): 1 g via INTRAVENOUS
  Filled 2023-04-29 (×2): qty 10

## 2023-04-29 MED ORDER — ENOXAPARIN SODIUM 40 MG/0.4ML IJ SOSY
40.0000 mg | PREFILLED_SYRINGE | INTRAMUSCULAR | Status: DC
  Administered 2023-04-30 – 2023-05-01 (×2): 40 mg via SUBCUTANEOUS
  Filled 2023-04-29 (×2): qty 0.4

## 2023-04-29 MED ORDER — IPRATROPIUM BROMIDE 0.02 % IN SOLN
0.5000 mg | Freq: Once | RESPIRATORY_TRACT | Status: AC
  Administered 2023-04-29: 0.5 mg via RESPIRATORY_TRACT
  Filled 2023-04-29: qty 2.5

## 2023-04-29 MED ORDER — ARFORMOTEROL TARTRATE 15 MCG/2ML IN NEBU
15.0000 ug | INHALATION_SOLUTION | Freq: Two times a day (BID) | RESPIRATORY_TRACT | Status: DC
  Administered 2023-04-29 – 2023-05-01 (×4): 15 ug via RESPIRATORY_TRACT
  Filled 2023-04-29 (×4): qty 2

## 2023-04-29 MED ORDER — PANTOPRAZOLE SODIUM 40 MG PO TBEC
40.0000 mg | DELAYED_RELEASE_TABLET | Freq: Every day | ORAL | Status: DC
  Administered 2023-04-30 – 2023-05-01 (×2): 40 mg via ORAL
  Filled 2023-04-29 (×2): qty 1

## 2023-04-29 MED ORDER — INSULIN ASPART 100 UNIT/ML IJ SOLN
0.0000 [IU] | Freq: Every day | INTRAMUSCULAR | Status: DC
  Administered 2023-04-29: 3 [IU] via SUBCUTANEOUS
  Administered 2023-04-30: 2 [IU] via SUBCUTANEOUS
  Filled 2023-04-29: qty 0.05

## 2023-04-29 MED ORDER — HYDROCODONE-ACETAMINOPHEN 5-325 MG PO TABS
1.0000 | ORAL_TABLET | Freq: Two times a day (BID) | ORAL | Status: DC | PRN
  Administered 2023-04-30: 1 via ORAL
  Filled 2023-04-29: qty 1

## 2023-04-29 MED ORDER — IPRATROPIUM-ALBUTEROL 0.5-2.5 (3) MG/3ML IN SOLN
3.0000 mL | Freq: Four times a day (QID) | RESPIRATORY_TRACT | Status: DC | PRN
  Administered 2023-04-30: 3 mL via RESPIRATORY_TRACT
  Filled 2023-04-29: qty 3

## 2023-04-29 MED ORDER — INSULIN ASPART 100 UNIT/ML IJ SOLN
0.0000 [IU] | Freq: Three times a day (TID) | INTRAMUSCULAR | Status: DC
  Administered 2023-04-30: 8 [IU] via SUBCUTANEOUS
  Administered 2023-04-30: 5 [IU] via SUBCUTANEOUS
  Administered 2023-04-30: 11 [IU] via SUBCUTANEOUS
  Filled 2023-04-29: qty 0.15

## 2023-04-29 MED ORDER — FINASTERIDE 5 MG PO TABS
5.0000 mg | ORAL_TABLET | Freq: Every day | ORAL | Status: DC
  Administered 2023-04-30 – 2023-05-01 (×2): 5 mg via ORAL
  Filled 2023-04-29 (×2): qty 1

## 2023-04-29 MED ORDER — BISACODYL 5 MG PO TBEC: 5.0000 mg | DELAYED_RELEASE_TABLET | Freq: Every day | ORAL | Status: DC | PRN

## 2023-04-29 MED ORDER — ACETAMINOPHEN 650 MG RE SUPP: 650.0000 mg | Freq: Four times a day (QID) | RECTAL | Status: DC | PRN

## 2023-04-29 MED ORDER — SIMVASTATIN 20 MG PO TABS
40.0000 mg | ORAL_TABLET | Freq: Every day | ORAL | Status: DC
  Administered 2023-04-30 – 2023-05-01 (×2): 40 mg via ORAL
  Filled 2023-04-29 (×2): qty 2

## 2023-04-29 NOTE — ED Provider Notes (Signed)
[facility] AT [facility] Provider Note   CSN: [id number] Arrival date & time: 04/29/23  1927     History  Chief Complaint  Patient presents with   Shortness of Breath    James [name] is a 67 y.o. male.  67 year old male presents with 2 days of trouble breathing.  Does have a history of asthma as well as bronchiectasis and endorses fever and chills.  Endorses  myalgias.  Denies any also has a history of pneumonia and this feels similar.  Denies any vomiting or diarrhea.  Dyspnea has been exertional.  No CHF or anginal type symptoms.      Home Medications Prior to Admission medications   Medication Sig Start Date End Date Taking? Authorizing Provider  acetaminophen (TYLENOL) 500 MG tablet Take 1,000 mg by mouth every 6 (six) hours as needed for mild pain or headache.    [provider]  albuterol (PROVENTIL) (2.5 MG/3ML) 0.083% nebulizer solution Take 3 mLs by nebulization every 6 (six) hours as needed for wheezing or shortness of breath. 05/17/22   Oretha Milch, MD  albuterol (VENTOLIN HFA) 108 (90 Base) MCG/ACT inhaler INHALE 2 PUFFS BY MOUTH INTO THE LUNGS EVERY 6 HOURS AS NEEDED FOR WHEEZING OR SHORTNESS OF BREATH. 03/25/23 03/24/24  Oretha Milch, MD  aspirin EC 81 MG tablet Take 1 tablet (81 mg total) by mouth daily. Swallow whole. Patient taking differently: Take 81 mg by mouth at bedtime. Swallow whole. 04/16/20   Tolia, Sunit, DO  benzonatate (TESSALON) 100 MG capsule Take 1 capsule by mouth 3 times daily as needed 10/21/22     Budeson-Glycopyrrol-Formoterol (BREZTRI AEROSPHERE) 160-9-4.8 MCG/ACT AERO Inhale 2 puffs into the lungs 2 (two) times daily.    [provider]  cefdinir (OMNICEF) 300 MG capsule Take 1 capsule (300 mg total) by mouth 2 (two) times daily. 03/29/23   Oretha Milch, MD  cetirizine (ZYRTEC) 10 MG tablet Take 10 mg by mouth daily.    [provider]  Continuous Blood Gluc Sensor (FREESTYLE LIBRE 3  SENSOR) MISC Change sensor every 14 days to monitor blood glucose continously 04/08/22     cyclobenzaprine (FLEXERIL) 5 MG tablet Take 1 tablet (5 mg total) by mouth 2 (two) times daily. 09/25/22     doxycycline (VIBRA-TABS) 100 MG tablet Take 1 tablet (100 mg total) by mouth 2 (two) times daily for 7 days. 03/30/23     finasteride (PROSCAR) 5 MG tablet Take 1 tablet (5 mg total) by mouth daily. 03/22/23     fluticasone (FLONASE) 50 MCG/ACT nasal spray Place 2 sprays into both nostrils daily. 06/02/22     Glucagon (GVOKE HYPOPEN 2-PACK) 1 MG/0.2ML SOAJ Inject under the skin as directed for severe hypoglycemia. 06/17/22     Glucagon, rDNA, (GLUCAGON EMERGENCY) 1 MG KIT USE AS NEEDED FOR SEVERE HYPOGLYCEMIA 09/25/20 03/26/22  Adrian Prince, MD  glucose blood (CONTOUR NEXT TEST) test strip USE 8 TIMES DAILY AS DIRECTED TO MONITOR BLOOD GLUCOSE 04/23/22     guaiFENesin-codeine (ROBITUSSIN AC) 100-10 MG/5ML syrup Take 5 mLs by mouth 3 (three) times daily as needed for cough. 11/18/22   Luciano Cutter, MD  guaiFENesin-codeine [facility]) 100-10 MG/5ML syrup Take 5 mLs by mouth 3 (three) times daily as needed for cough. 03/29/23   Oretha Milch, MD  HYDROcodone-acetaminophen (NORCO/VICODIN) 5-325 MG tablet Take 1 tablet by mouth 3 (three) times daily as needed. 03/29/23     ibuprofen (ADVIL) 200 MG  tablet Take 800 mg by mouth every 8 (eight) hours as needed (pain.).    [provider]  insulin aspart (FIASP FLEXTOUCH) 100 UNIT/ML FlexTouch Pen INJECT UP TO 100 UNITS A [place] (ICR 20, ISF 20, target 120) 03/23/23     insulin aspart (NOVOLOG FLEXPEN) 100 UNIT/ML FlexPen Inject 100 Units into the skin daily. 03/10/23     insulin degludec (TRESIBA FLEXTOUCH) 100 UNIT/ML FlexTouch Pen Inject 40 Units into the skin daily. 12/20/22     Insulin Pen Needle (TECHLITE PEN NEEDLES) 31G X 8 MM MISC Use 1 pen needle 4-5 times daily as directed. 11/30/22     Insulin Pen Needle 31G X 8 MM MISC Use 1 pen needle subcutaneously 4  - 5 times daily as directed. 09/07/21     levothyroxine (SYNTHROID) 100 MCG tablet Take 1 tablet (100 mcg total) by mouth daily. 03/05/23     metoCLOPramide (REGLAN) 5 MG tablet Take 1 tablet (5 mg total) by mouth 2 (two) times daily before meals 04/08/22     ondansetron (ZOFRAN-ODT) 4 MG disintegrating tablet Dissolve 1 tablet (4 mg total) by mouth every 8 (eight) hours as needed for nausea or vomiting. 06/12/22   Carlisle Beers, FNP  orphenadrine (NORFLEX) 100 MG tablet Take 1 tablet (100 mg total) by mouth in the morning and 1 tablet (100 mg total) before bedtime. Do all this for 10 days. 07/23/22     pantoprazole (PROTONIX) 40 MG tablet Take 1 tablet by mouth daily. 01/10/23     promethazine (PHENERGAN) 25 MG tablet Take 1 tablet (25 mg total) by mouth 3 (three) times daily as needed. 06/25/22     promethazine (PHENERGAN) 25 MG tablet Take 1 tablet (25 mg) by mouth 3 times daily as needed. 01/27/23     promethazine (PHENERGAN) 25 MG tablet Take 1 tablet (25 mg total) by mouth 3 (three) times daily as needed. 04/28/23     simvastatin (ZOCOR) 40 MG tablet Take 1 tablet (40 mg total) by mouth daily. 08/09/22     tamsulosin (FLOMAX) 0.4 MG CAPS capsule Take 1 capsule (0.4 mg total) by mouth daily. 01/10/23     traZODone (DESYREL) 150 MG tablet Take 1 tablet (150 mg total) by mouth at bedtime as needed for sleep 12/09/22     varenicline (CHANTIX) 1 MG tablet Take 1 tablet (1 mg total) by mouth 2 (two) times daily. 07/22/22         Allergies    Augmentin [amoxicillin-pot clavulanate] and Lexapro [escitalopram oxalate]    Review of Systems   Review of Systems  All other systems reviewed and are negative.   Physical Exam Updated Vital Signs BP (!) 152/64 (BP Location: Left Arm)   Pulse (!) 106   Temp 98.4 F (36.9 C) (Oral)   Resp (!) 27   SpO2 (!) 89%  Physical Exam Vitals and nursing note reviewed.  Constitutional:      General: He is not in acute distress.    Appearance: Normal  appearance. He is well-developed. He is not toxic-appearing.  HENT:     Head: Normocephalic and atraumatic.  Eyes:     General: Lids are normal.     Conjunctiva/sclera: Conjunctivae normal.     Pupils: Pupils are equal, round, and reactive to light.  Neck:     Thyroid: No thyroid mass.     Trachea: No tracheal deviation.  Cardiovascular:     Rate and Rhythm: Normal rate and regular rhythm.  Heart sounds: Normal heart sounds. No murmur heard.    No gallop.  Pulmonary:     Effort: Tachypnea and respiratory distress present.     Breath sounds: No stridor. Decreased breath sounds and wheezing present. No rhonchi or rales.  Abdominal:     General: There is no distension.     Palpations: Abdomen is soft.     Tenderness: There is no abdominal tenderness. There is no rebound.  Musculoskeletal:        General: No tenderness. Normal range of motion.     Cervical back: Normal range of motion and neck supple.  Skin:    General: Skin is warm and dry.     Findings: No abrasion or rash.  Neurological:     Mental Status: He is alert and oriented to person, place, and time. Mental status is at baseline.     GCS: GCS eye subscore is 4. GCS verbal subscore is 5. GCS motor subscore is 6.     Cranial Nerves: Cranial nerves are intact. No cranial nerve deficit.     Sensory: No sensory deficit.     Motor: Motor function is intact.  Psychiatric:        Attention and Perception: Attention normal.        Speech: Speech normal.        Behavior: Behavior normal.    ED Results / Procedures / Treatments   Labs (all labs ordered are listed, but only abnormal results are displayed) Labs Reviewed  CULTURE, BLOOD (ROUTINE X 2)  CULTURE, BLOOD (ROUTINE X 2)  SARS CORONAVIRUS 2 BY RT PCR  COMPREHENSIVE METABOLIC PANEL  CBC WITH DIFFERENTIAL/PLATELET  URINALYSIS, W/ REFLEX TO CULTURE (INFECTION SUSPECTED)  I-STAT CG4 LACTIC ACID, ED    EKG EKG Interpretation Date/Time:  Friday April 29 2023  20:47:41 EDT Ventricular Rate:  90 PR Interval:  149 QRS Duration:  89 QT Interval:  347 QTC Calculation: 425 R Axis:   89  Text Interpretation: Sinus rhythm Borderline right axis deviation No significant change since last tracing Confirmed by Lorre Nick ([id number]) on 04/29/2023 9:38:22 PM  Radiology No results found.  Procedures Procedures    Medications Ordered in ED Medications  albuterol (VENTOLIN HFA) 108 (90 Base) MCG/ACT inhaler 2 puff (has no administration in time range)  albuterol (PROVENTIL) (2.5 MG/3ML) 0.083% nebulizer solution (has no administration in time range)  ipratropium (ATROVENT) nebulizer solution 0.5 mg (has no administration in time range)    ED Course/ Medical Decision Making/ A&P                                 Medical Decision Making Amount and/or Complexity of Data Reviewed Labs: ordered. Radiology: ordered. ECG/medicine tests: ordered.  Risk Prescription drug management.   Patient is EKG per interpretation shows normal sinus rhythm.  Chest x-ray per interpretation shows possible pneumonia.  Does not appear to be septic at this time.  Patient had also diffuse bronchospasm.  Was given albuterol along with Atrovent and also Solu-Medrol.  Wheezing is somewhat improved.  Will cover patient for possible community-acquired pneumonia.  His COVID test is negative.  Low suspicion for ACS or PE or CHF.  Plan will be for hospitalization.  Will consult hospitalist team  CRITICAL CARE Performed by: Toy Baker Total critical care time: 50 minutes Critical care time was exclusive of separately billable procedures and treating other patients. Critical care was necessary to  treat or prevent imminent or life-threatening deterioration. Critical care was time spent personally by me on the following activities: development of treatment plan with patient and/or surrogate as well as nursing, discussions with consultants, evaluation of patient's response to  treatment, examination of patient, obtaining history from patient or surrogate, ordering and performing treatments and interventions, ordering and review of laboratory studies, ordering and review of radiographic studies, pulse oximetry and re-evaluation of patient's condition.         Final Clinical Impression(s) / ED Diagnoses Final diagnoses:  None    Rx / DC Orders ED Discharge Orders     None         Lorre Nick, MD 04/29/23 2225

## 2023-04-29 NOTE — H&P (Signed)
History and Physical    James [name] WUJ:[id number] DOB: July 17, 1956 DOA: 04/29/2023  PCP: James Prince, MD  Patient coming from: Home  I have personally briefly reviewed patient's old medical records in [facility]  Chief Complaint: Shortness of breath  HPI: James [name] is a 67 y.o. male with medical history significant for chronic asthmatic bronchitis / bronchiectasis, insulin-dependent diabetes with retinopathy, CAD, HLD, hypothyroidism, BPH, chronic pain who presented to the ED for evaluation of shortness of breath.  Patient reports shortness of breath for the last 2 days.  Dyspnea occurs at rest and with exertion.  [name] has had frequent cough productive of clear sputum.  [name] has had chills but denies subjective fevers.  Has not had chest pain, abdominal pain, peripheral edema.  [name] did not have significant relief with his home inhalers therefore came to the ED for further evaluation.  ED Course  Labs/Imaging on admission: I have personally reviewed following labs and imaging studies.  Initial vitals showed BP 152/64, pulse 104, RR 27, temp 98.4 F, SpO2 89% on room air.  Patient placed on 2 L O2 via [place] with SpO2 improved to 97-99%.  Labs show WBC 8.7, hemoglobin 13.9, platelets 235,000, sodium 135, potassium 3.7, bicarb 23, BUN 13, creatinine 0.73, serum glucose 232, lactic acid 0.9.  SARS-CoV-2 PCR negative.  Blood cultures in process.  2 view chest x-ray showed airspace disease in the right lower lobe, atelectasis versus infiltrate.  No consolidation, effusion, or pneumothorax.  Patient was given IV Solu-Medrol 125 mg, IV ceftriaxone and azithromycin, albuterol and Atrovent nebulizers.  The hospitalist service was consulted to admit for further evaluation and management.  Review of Systems: All systems reviewed and are negative except as documented in history of present illness above.   Past Medical History:  Diagnosis Date   ANXIETY 04/03/2007   Anxiety    ASTHMA 09/06/2008    Asthma    ASTHMATIC BRONCHITIS, ACUTE 10/25/2008   Bladder neck obstruction    CARPAL TUNNEL SYNDROME, BILATERAL 07/31/2007   issues resolved, no surgery   Cervical disc disease    Chronic bronchitis (HCC)    "get it about q yr" (02/12/2014)   COPD (chronic obstructive pulmonary disease) (HCC)    CORONARY ARTERY DISEASE 04/03/2007   DEPRESSION 04/03/2007   Depression    DIABETES MELLITUS, TYPE I 04/03/2007   Diabetic retinopathy associated with diabetes mellitus due to underlying condition (HCC) 04/03/2007   DM W/EYE MANIFESTATIONS, TYPE I, UNCONTROLLED 04/04/2007   DM W/RENAL MNFST, TYPE I, UNCONTROLLED 04/04/2007   ED (erectile dysfunction)    History of kidney stones    HYPERLIPIDEMIA 04/04/2007   Pneumonia    "several times and again today" (02/13/2104)   Renal insufficiency    Seizures (HCC)    "insulin seizure from time to time; none in the last couple years" (02/12/2014)   Spinal stenosis     Past Surgical History:  Procedure Laterality Date   ANTERIOR CERVICAL DECOMP/DISCECTOMY FUSION  2000   "couple screws and a plate"   ANTERIOR CERVICAL DECOMP/DISCECTOMY FUSION N/A 06/24/2016   Procedure: ANTERIOR CERVICAL DECOMPRESSION/DISCECTOMY FUSION CERVICAL FOUR - CERVICAL FIVE, CERVICAL FIVE - CERVICAL SIX; REMOVAL TETHER CERVICAL PLATE;  Surgeon: Shirlean Kelly, MD;  Location: [facility] OR;  Service: Neurosurgery;  Laterality: N/A;  ANTERIOR CERVICAL DECOMPRESSION/DISCECTOMY FUSION CERVICAL FOUR - CERVICAL FIVE, CERVICAL FIVE - CERVICAL SIX; REMOVAL TETHER CERVICAL PLATE   APPENDECTOMY     BACK SURGERY     CARDIAC CATHETERIZATION  1990's  CATARACT EXTRACTION W/ INTRAOCULAR LENS  IMPLANT, BILATERAL Bilateral    CYSTOSCOPY WITH RETROGRADE PYELOGRAM, URETEROSCOPY AND STENT PLACEMENT Bilateral 04/06/2013   Procedure: BILATERAL CYSTOSCOPY WITH RETROGRADE PYELOGRAMS, STENT PLACEMENTS AND LEFT URETEROSCOPY AND STONE REMOVAL;  Surgeon: Sebastian Ache, MD;  Location: [facility]S;  Service: Urology;   Laterality: Bilateral;   CYSTOSCOPY WITH STENT PLACEMENT Right 04/12/2013   Procedure: CYSTOSCOPY WITH STENT PLACEMENT;  Surgeon: Sebastian Ache, MD;  Location: [facility];  Service: Urology;  Laterality: Right;   CYSTOSCOPY/RETROGRADE/URETEROSCOPY Bilateral 04/12/2013   Procedure: CYSTOSCOPY/RETROGRADE/URETEROSCOPY;  Surgeon: Sebastian Ache, MD;  Location: [facility];  Service: Urology;  Laterality: Bilateral;  RIGHT RETROGRADE    HOLMIUM LASER APPLICATION Left 04/06/2013   Procedure: HOLMIUM LASER APPLICATION;  Surgeon: Sebastian Ache, MD;  Location: [facility];  Service: Urology;  Laterality: Left;   LEFT HEART CATH AND CORONARY ANGIOGRAPHY N/A 04/29/2020   Procedure: LEFT HEART CATH AND CORONARY ANGIOGRAPHY;  Surgeon: Elder Negus, MD;  Location: [facility];  Service: Cardiovascular;  Laterality: N/A;   LUMBAR LAMINECTOMY/DECOMPRESSION MICRODISCECTOMY N/A 04/20/2019   Procedure: Lumbar microdisectomy and decompression L5-S1 left;  Surgeon: Ranee Gosselin, MD;  Location: [facility];  Service: Orthopedics;  Laterality: N/A;    LYMPH NODE DISSECTION  ~ 1960   groin   stress cardiolite  09/06/2002   TONSILLECTOMY     VITRECTOMY Bilateral     Social History:  reports that [name] quit smoking about 3 years ago. His smoking use included cigarettes. [name] started smoking about 43 years ago. [name] has a 40 pack-year smoking history. [name] has been exposed to tobacco smoke. [name] has never used smokeless tobacco. [name] reports that [name] does not drink alcohol and does not use drugs.  Allergies  Allergen Reactions   Augmentin [Amoxicillin-Pot Clavulanate] Nausea And Vomiting and Other (See Comments)    Did it involve swelling of the face/tongue/throat, SOB, or low BP? No Did it involve sudden or severe rash/hives, skin peeling, or any reaction on the inside of your mouth or nose? No Did you need to seek medical attention at a hospital or doctor's office? No When did it last happen?      5+ years If all above  answers are "NO", may proceed with cephalosporin use.    Lexapro [Escitalopram Oxalate] Itching    Family History  Problem Relation Age of Onset   Stroke Father        strong FH cerebrovascular disease   Diabetes Brother    Heart disease Brother        CHF   Cancer Mother    Colon cancer Neg Hx      Prior to Admission medications   Medication Sig Start Date End Date Taking? Authorizing Provider  acetaminophen (TYLENOL) 500 MG tablet Take 1,000 mg by mouth every 6 (six) hours as needed for mild pain or headache.    [provider]  albuterol (PROVENTIL) (2.5 MG/3ML) 0.083% nebulizer solution Take 3 mLs by nebulization every 6 (six) hours as needed for wheezing or shortness of breath. 05/17/22   Oretha Milch, MD  albuterol (VENTOLIN HFA) 108 (90 Base) MCG/ACT inhaler INHALE 2 PUFFS BY MOUTH INTO THE LUNGS EVERY 6 HOURS AS NEEDED FOR WHEEZING OR SHORTNESS OF BREATH. 03/25/23 03/24/24  Oretha Milch, MD  aspirin EC 81 MG tablet Take 1 tablet (81 mg total) by mouth daily. Swallow whole. Patient taking differently: Take 81 mg by mouth at bedtime. Swallow whole. 04/16/20   Tessa Lerner, DO  benzonatate (TESSALON) 100 MG capsule Take 1 capsule by mouth 3 times daily as needed 10/21/22     Budeson-Glycopyrrol-Formoterol (BREZTRI AEROSPHERE) 160-9-4.8 MCG/ACT AERO Inhale 2 puffs into the lungs 2 (two) times daily.    [provider]  cefdinir (OMNICEF) 300 MG capsule Take 1 capsule (300 mg total) by mouth 2 (two) times daily. 03/29/23   Oretha Milch, MD  cetirizine (ZYRTEC) 10 MG tablet Take 10 mg by mouth daily.    [provider]  Continuous Blood Gluc Sensor (FREESTYLE LIBRE 3 SENSOR) MISC Change sensor every 14 days to monitor blood glucose continously 04/08/22     cyclobenzaprine (FLEXERIL) 5 MG tablet Take 1 tablet (5 mg total) by mouth 2 (two) times daily. 09/25/22     doxycycline (VIBRA-TABS) 100 MG tablet Take 1 tablet (100 mg total) by mouth 2 (two) times daily for  7 days. 03/30/23     finasteride (PROSCAR) 5 MG tablet Take 1 tablet (5 mg total) by mouth daily. 03/22/23     fluticasone (FLONASE) 50 MCG/ACT nasal spray Place 2 sprays into both nostrils daily. 06/02/22     Glucagon (GVOKE HYPOPEN 2-PACK) 1 MG/0.2ML SOAJ Inject under the skin as directed for severe hypoglycemia. 06/17/22     Glucagon, rDNA, (GLUCAGON EMERGENCY) 1 MG KIT USE AS NEEDED FOR SEVERE HYPOGLYCEMIA 09/25/20 03/26/22  James Prince, MD  glucose blood (CONTOUR NEXT TEST) test strip USE 8 TIMES DAILY AS DIRECTED TO MONITOR BLOOD GLUCOSE 04/23/22     guaiFENesin-codeine (ROBITUSSIN AC) 100-10 MG/5ML syrup Take 5 mLs by mouth 3 (three) times daily as needed for cough. 11/18/22   Luciano Cutter, MD  guaiFENesin-codeine [facility]) 100-10 MG/5ML syrup Take 5 mLs by mouth 3 (three) times daily as needed for cough. 03/29/23   Oretha Milch, MD  HYDROcodone-acetaminophen (NORCO/VICODIN) 5-325 MG tablet Take 1 tablet by mouth 3 (three) times daily as needed. 03/29/23     ibuprofen (ADVIL) 200 MG tablet Take 800 mg by mouth every 8 (eight) hours as needed (pain.).    [provider]  insulin aspart (FIASP FLEXTOUCH) 100 UNIT/ML FlexTouch Pen INJECT UP TO 100 UNITS A [place] (ICR 20, ISF 20, target 120) 03/23/23     insulin aspart (NOVOLOG FLEXPEN) 100 UNIT/ML FlexPen Inject 100 Units into the skin daily. 03/10/23     insulin degludec (TRESIBA FLEXTOUCH) 100 UNIT/ML FlexTouch Pen Inject 40 Units into the skin daily. 12/20/22     Insulin Pen Needle (TECHLITE PEN NEEDLES) 31G X 8 MM MISC Use 1 pen needle 4-5 times daily as directed. 11/30/22     Insulin Pen Needle 31G X 8 MM MISC Use 1 pen needle subcutaneously 4 - 5 times daily as directed. 09/07/21     levothyroxine (SYNTHROID) 100 MCG tablet Take 1 tablet (100 mcg total) by mouth daily. 03/05/23     metoCLOPramide (REGLAN) 5 MG tablet Take 1 tablet (5 mg total) by mouth 2 (two) times daily before meals 04/08/22     ondansetron (ZOFRAN-ODT) 4 MG  disintegrating tablet Dissolve 1 tablet (4 mg total) by mouth every 8 (eight) hours as needed for nausea or vomiting. 06/12/22   Carlisle Beers, FNP  orphenadrine (NORFLEX) 100 MG tablet Take 1 tablet (100 mg total) by mouth in the morning and 1 tablet (100 mg total) before bedtime. Do all this for 10 days. 07/23/22     pantoprazole (PROTONIX) 40 MG tablet Take 1 tablet by mouth daily. 01/10/23     promethazine (  PHENERGAN) 25 MG tablet Take 1 tablet (25 mg total) by mouth 3 (three) times daily as needed. 06/25/22     promethazine (PHENERGAN) 25 MG tablet Take 1 tablet (25 mg) by mouth 3 times daily as needed. 01/27/23     promethazine (PHENERGAN) 25 MG tablet Take 1 tablet (25 mg total) by mouth 3 (three) times daily as needed. 04/28/23     simvastatin (ZOCOR) 40 MG tablet Take 1 tablet (40 mg total) by mouth daily. 08/09/22     tamsulosin (FLOMAX) 0.4 MG CAPS capsule Take 1 capsule (0.4 mg total) by mouth daily. 01/10/23     traZODone (DESYREL) 150 MG tablet Take 1 tablet (150 mg total) by mouth at bedtime as needed for sleep 12/09/22     varenicline (CHANTIX) 1 MG tablet Take 1 tablet (1 mg total) by mouth 2 (two) times daily. 07/22/22       Physical Exam: Vitals:   04/29/23 1945 04/29/23 2030 04/29/23 2050 04/29/23 2245  BP:   114/64 (!) 107/41  Pulse:   89 (!) 104  Resp:   (!) 8 16  Temp:   98.4 F (36.9 C)   TempSrc:   Oral   SpO2: (!) 89% 97% 99% 96%   Constitutional: Resting supine in bed, NAD, calm, comfortable Eyes: EOMI, lids and conjunctivae normal ENMT: Mucous membranes are moist. Posterior pharynx clear of any exudate or lesions.poor dentition.  Neck: normal, supple, no masses. Respiratory: Expiratory wheezing throughout. Normal respiratory effort while on 2 L O2 via [place]. No accessory muscle use.  Cardiovascular: Regular rate and rhythm, no murmurs / rubs / gallops. No extremity edema. 2+ pedal pulses. Abdomen: no tenderness, no masses palpated. Musculoskeletal: no clubbing /  cyanosis. No joint deformity upper and lower extremities. Good ROM, no contractures. Normal muscle tone.  Skin: no rashes, lesions, ulcers. No induration Neurologic: Sensation intact. Strength 5/5 in all 4.  Psychiatric: Normal judgment and insight. Alert and oriented x 3. Normal mood.   EKG: Personally reviewed. Sinus rhythm, rate 90, no acute ischemic changes.  Assessment/Plan Principal Problem:   Acute respiratory failure with hypoxia (HCC) Active Problems:   CAD (coronary artery disease)   DM type 1 (diabetes mellitus, type 1) (HCC)   Asthma, chronic, unspecified asthma severity, with acute exacerbation   Benign prostatic hyperplasia with lower urinary tract symptoms   Chronic pain   Hypothyroidism   James [name] is a 67 y.o. male with medical history significant for chronic asthmatic bronchitis/bronchiectasis, insulin-dependent diabetes with retinopathy, CAD, HLD, hypothyroidism, BPH, chronic pain who is admitted with acute asthma exacerbation.  Assessment and Plan: Acute exacerbation of chronic asthmatic bronchitis Acute respiratory failure with hypoxia: Symptoms improving after initial treatment in the ED however still significant wheezing throughout the lung fields.  SpO2 was 89% on RA on arrival, currently stable on 2 L O2 via [place].  [name] does not require supplemental O2 at baseline.  CXR with right lower lobe airspace disease, suspect more likely atelectasis versus infiltrate but [name] has been placed on empiric antibiotics for now. -Continue Brovana/Pulmicort BID -DuoNebs and albuterol prn -IV Solu-Medrol 40 mg BID -Continue supplemental O2 and wean as able -Continue IV ceftriaxone and azithromycin for now -IS, FV, Mucinex  Insulin-dependent diabetes: Placed on Semglee and SSI.  May need further adjustment with ongoing steroid use.  Coronary artery disease: Stable, denies chest pain.  Continue aspirin and simvastatin.  Hypothyroidism: Continue Synthroid.  Chronic  pain: Continue Norco as needed.  BPH: Continue Proscar and  Flomax.   DVT prophylaxis: enoxaparin (LOVENOX) injection 40 mg Start: 04/29/23 2300 Code Status: Full code, confirmed with patient on admission Family Communication: Discussed with patient, [name] has discussed with family Disposition Plan: From home and likely discharge to home pending clinical progress Consults called: None Severity of Illness: The appropriate patient status for this patient is OBSERVATION. Observation status is judged to be reasonable and necessary in order to provide the required intensity of service to ensure the patient's safety. The patient's presenting symptoms, physical exam findings, and initial radiographic and laboratory data in the context of their medical condition is felt to place them at decreased risk for further clinical deterioration. Furthermore, it is anticipated that the patient will be medically stable for discharge from the hospital within 2 midnights of admission.   Darreld Mclean MD [facility]  If 7PM-7AM, please contact night-coverage [email or web address]  04/29/2023, 11:05 PM

## 2023-04-29 NOTE — ED Triage Notes (Addendum)
Pt arrived from home via POV c/o sob that began x 2 days ago and is gradually getting worse. Pt noted to be labored at rest. Inspiratory wheezes and rhonchi both noted bilaterally. 89% room air. Placed on 2lpm via  in triage.

## 2023-04-29 NOTE — Hospital Course (Addendum)
James [name] is a 67 y.o. male with medical history significant for chronic asthmatic bronchitis/bronchiectasis, insulin-dependent diabetes with retinopathy, CAD, HLD, hypothyroidism, BPH, chronic pain who is admitted with acute asthma exacerbation. He also required oxygen briefly and was able to be weaned off prior to discharge.  He ambulated well with no desaturations. He was discharged on prednisone and azithromycin to complete courses.

## 2023-04-30 DIAGNOSIS — E039 Hypothyroidism, unspecified: Secondary | ICD-10-CM | POA: Diagnosis present

## 2023-04-30 DIAGNOSIS — Z880 Allergy status to penicillin: Secondary | ICD-10-CM | POA: Diagnosis not present

## 2023-04-30 DIAGNOSIS — Z7951 Long term (current) use of inhaled steroids: Secondary | ICD-10-CM | POA: Diagnosis not present

## 2023-04-30 DIAGNOSIS — Z1152 Encounter for screening for COVID-19: Secondary | ICD-10-CM | POA: Diagnosis not present

## 2023-04-30 DIAGNOSIS — Z79899 Other long term (current) drug therapy: Secondary | ICD-10-CM | POA: Diagnosis not present

## 2023-04-30 DIAGNOSIS — J189 Pneumonia, unspecified organism: Principal | ICD-10-CM

## 2023-04-30 DIAGNOSIS — E10319 Type 1 diabetes mellitus with unspecified diabetic retinopathy without macular edema: Secondary | ICD-10-CM | POA: Diagnosis present

## 2023-04-30 DIAGNOSIS — Z79890 Hormone replacement therapy (postmenopausal): Secondary | ICD-10-CM | POA: Diagnosis not present

## 2023-04-30 DIAGNOSIS — Z7982 Long term (current) use of aspirin: Secondary | ICD-10-CM | POA: Diagnosis not present

## 2023-04-30 DIAGNOSIS — Z9842 Cataract extraction status, left eye: Secondary | ICD-10-CM | POA: Diagnosis not present

## 2023-04-30 DIAGNOSIS — Z888 Allergy status to other drugs, medicaments and biological substances status: Secondary | ICD-10-CM | POA: Diagnosis not present

## 2023-04-30 DIAGNOSIS — Z794 Long term (current) use of insulin: Secondary | ICD-10-CM | POA: Diagnosis not present

## 2023-04-30 DIAGNOSIS — N401 Enlarged prostate with lower urinary tract symptoms: Secondary | ICD-10-CM | POA: Diagnosis present

## 2023-04-30 DIAGNOSIS — Z87891 Personal history of nicotine dependence: Secondary | ICD-10-CM | POA: Diagnosis not present

## 2023-04-30 DIAGNOSIS — J9601 Acute respiratory failure with hypoxia: Secondary | ICD-10-CM | POA: Diagnosis present

## 2023-04-30 DIAGNOSIS — E785 Hyperlipidemia, unspecified: Secondary | ICD-10-CM | POA: Diagnosis present

## 2023-04-30 DIAGNOSIS — Z9841 Cataract extraction status, right eye: Secondary | ICD-10-CM | POA: Diagnosis not present

## 2023-04-30 DIAGNOSIS — J45901 Unspecified asthma with (acute) exacerbation: Secondary | ICD-10-CM | POA: Diagnosis present

## 2023-04-30 DIAGNOSIS — I2510 Atherosclerotic heart disease of native coronary artery without angina pectoris: Secondary | ICD-10-CM | POA: Diagnosis present

## 2023-04-30 DIAGNOSIS — G8929 Other chronic pain: Secondary | ICD-10-CM | POA: Diagnosis present

## 2023-04-30 DIAGNOSIS — Z8249 Family history of ischemic heart disease and other diseases of the circulatory system: Secondary | ICD-10-CM | POA: Diagnosis not present

## 2023-04-30 DIAGNOSIS — J209 Acute bronchitis, unspecified: Secondary | ICD-10-CM | POA: Diagnosis present

## 2023-04-30 DIAGNOSIS — Z961 Presence of intraocular lens: Secondary | ICD-10-CM | POA: Diagnosis present

## 2023-04-30 DIAGNOSIS — Z23 Encounter for immunization: Secondary | ICD-10-CM | POA: Diagnosis present

## 2023-04-30 LAB — URINALYSIS, W/ REFLEX TO CULTURE (INFECTION SUSPECTED)
Bacteria, UA: NONE SEEN
Bilirubin Urine: NEGATIVE
Glucose, UA: >=500 mg/dL — AB
Hgb urine dipstick: NEGATIVE
Ketones, ur: 20 mg/dL — AB
Leukocytes,Ua: NEGATIVE
Nitrite: NEGATIVE
Protein, ur: NEGATIVE mg/dL
Specific Gravity, Urine: 1.025 (ref 1.005–1.030)
pH: 6 (ref 5.0–8.0)

## 2023-04-30 LAB — RESPIRATORY PANEL BY PCR

## 2023-04-30 LAB — CBG MONITORING, ED
Glucose-Capillary: 288 mg/dL — ABNORMAL HIGH (ref 70–99)
Glucose-Capillary: 343 mg/dL — ABNORMAL HIGH (ref 70–99)

## 2023-04-30 LAB — BASIC METABOLIC PANEL
Anion gap: 11 (ref 5–15)
BUN: 18 mg/dL (ref 8–23)
CO2: 22 mmol/L (ref 22–32)
Calcium: 8.7 mg/dL — ABNORMAL LOW (ref 8.9–10.3)
Chloride: 100 mmol/L (ref 98–111)
Creatinine, Ser: 0.78 mg/dL (ref 0.61–1.24)
GFR, Estimated: >60 mL/min (ref >60)
Glucose, Bld: 306 mg/dL — ABNORMAL HIGH (ref 70–99)
Potassium: 4 mmol/L (ref 3.5–5.1)
Sodium: 133 mmol/L — ABNORMAL LOW (ref 135–145)

## 2023-04-30 LAB — CBC
HCT: 40.8 % (ref 39.0–52.0)
Hemoglobin: 14.4 g/dL (ref 13.0–17.0)
MCH: 31.9 pg (ref 26.0–34.0)
MCHC: 35.3 g/dL (ref 30.0–36.0)
MCV: 90.3 fL (ref 80.0–100.0)
Platelets: 235 10*3/uL (ref 150–400)
RBC: 4.52 MIL/uL (ref 4.22–5.81)
RDW: 12.3 % (ref 11.5–15.5)
WBC: 6.8 10*3/uL (ref 4.0–10.5)
nRBC: 0 % (ref 0.0–0.2)

## 2023-04-30 LAB — PROCALCITONIN: Procalcitonin: 0.12 ng/mL

## 2023-04-30 LAB — GLUCOSE, CAPILLARY
Glucose-Capillary: 227 mg/dL — ABNORMAL HIGH (ref 70–99)
Glucose-Capillary: 227 mg/dL — ABNORMAL HIGH (ref 70–99)

## 2023-04-30 LAB — HIV ANTIBODY (ROUTINE TESTING W REFLEX): HIV Screen 4th Generation wRfx: NONREACTIVE

## 2023-04-30 MED ORDER — ALBUTEROL SULFATE (2.5 MG/3ML) 0.083% IN NEBU: 2.5000 mg | INHALATION_SOLUTION | RESPIRATORY_TRACT | Status: DC | PRN

## 2023-04-30 MED ORDER — PNEUMOCOCCAL 20-VAL CONJ VACC 0.5 ML IM SUSY
0.5000 mL | PREFILLED_SYRINGE | INTRAMUSCULAR | Status: AC
  Administered 2023-05-01: 0.5 mL via INTRAMUSCULAR
  Filled 2023-04-30: qty 0.5

## 2023-04-30 NOTE — Assessment & Plan Note (Signed)
Continue Synthroid

## 2023-04-30 NOTE — Progress Notes (Signed)
Progress Note    James [name]   OZH:[id number]  DOB: February 02, 1956  DOA: 04/29/2023     0 PCP: Adrian Prince, MD  Initial CC: SOB, cough  Hospital Course: Makar Vongphakdy Ken is a 67 y.o. male with medical history significant for chronic asthmatic bronchitis/bronchiectasis, insulin-dependent diabetes with retinopathy, CAD, HLD, hypothyroidism, BPH, chronic pain who is admitted with acute asthma exacerbation.  Interval History:  Undergoing nebulizer treatment in the ER this morning.  Still having ongoing wheezing and congestion.  Understands plan for asthma and possible pneumonia treatment.  Assessment and Plan: * Asthma, chronic, unspecified asthma severity, with acute exacerbation - Worsening cough and shortness of breath with associated wheezing for approximately 2 days prior to admission - No obvious sick contacts or precipitating factors - CXR shows possible infiltrates in right lower lobe -Continue breathing treatments, Mucinex - Continue steroids -Check RVP  Acute respiratory failure with hypoxia (HCC) - Not on oxygen at baseline - Etiology in setting of asthma exacerbation - Continue weaning oxygen as able  CAP (community acquired pneumonia) - Suspected right lower lobe infiltrate on CXR -No leukocytosis on admission - May be culprit for asthma exacerbation.  Procalcitonin equivocal - Continue empiric treatment for now  Hypothyroidism - Continue Synthroid  Chronic pain - Continue Norco as needed  Benign prostatic hyperplasia with lower urinary tract symptoms - Continue Flomax and Proscar  DM type 1 (diabetes mellitus, type 1) (HCC) - CBGs elevated in setting of steroid use - May need readjustment of regimen in setting of steroids - Continue Semglee and sliding scale for now  CAD (coronary artery disease) - Continue aspirin and statin    Old records reviewed in assessment of this patient  Antimicrobials: Azithromycin 04/29/2023 >> current Rocephin 04/29/2023 >>  current  DVT prophylaxis:  enoxaparin (LOVENOX) injection 40 mg Start: 04/30/23 1000   Code Status:   Code Status: Full Code  Mobility Assessment (Last 72 Hours)     Mobility Assessment   No documentation.           Barriers to discharge: None Disposition Plan: Home Status is: Observation  Objective: Blood pressure (!) 145/67, pulse 78, temperature 97.9 F (36.6 C), resp. rate 18, SpO2 98%.  Examination:  Physical Exam Constitutional:      General: He is not in acute distress.    Appearance: Normal appearance.  HENT:     Head: Normocephalic and atraumatic.     Mouth/Throat:     Mouth: Mucous membranes are moist.  Eyes:     Extraocular Movements: Extraocular movements intact.  Cardiovascular:     Rate and Rhythm: Normal rate and regular rhythm.  Pulmonary:     Effort: Pulmonary effort is normal. No respiratory distress.     Breath sounds: Wheezing and rhonchi present.  Abdominal:     General: Bowel sounds are normal. There is no distension.     Palpations: Abdomen is soft.     Tenderness: There is no abdominal tenderness.  Musculoskeletal:        General: Normal range of motion.     Cervical back: Normal range of motion and neck supple.  Skin:    General: Skin is warm and dry.  Neurological:     General: No focal deficit present.     Mental Status: He is alert.  Psychiatric:        Mood and Affect: Mood normal.        Behavior: Behavior normal.      Consultants:  Procedures:    Data Reviewed: Results for orders placed or performed during the hospital encounter of 04/29/23 (from the past 24 hour(s))  Comprehensive metabolic panel     Status: Abnormal   Collection Time: 04/29/23  8:39 PM  Result Value Ref Range   Sodium 135 135 - 145 mmol/L   Potassium 3.7 3.5 - 5.1 mmol/L   Chloride 104 98 - 111 mmol/L   CO2 23 22 - 32 mmol/L   Glucose, Bld 232 (H) 70 - 99 mg/dL   BUN 13 8 - 23 mg/dL   Creatinine, Ser 4.33 0.61 - 1.24 mg/dL   Calcium 8.3  (L) 8.9 - 10.3 mg/dL   Total Protein 6.5 6.5 - 8.1 g/dL   Albumin 3.6 3.5 - 5.0 g/dL   AST 11 (L) 15 - 41 U/L   ALT 16 0 - 44 U/L   Alkaline Phosphatase 87 38 - 126 U/L   Total Bilirubin 1.2 0.3 - 1.2 mg/dL   GFR, Estimated >29 >51 mL/min   Anion gap 8 5 - 15  CBC with Differential     Status: None   Collection Time: 04/29/23  8:39 PM  Result Value Ref Range   WBC 8.7 4.0 - 10.5 K/uL   RBC 4.36 4.22 - 5.81 MIL/uL   Hemoglobin 13.9 13.0 - 17.0 g/dL   HCT 88.4 16.6 - 06.3 %   MCV 90.8 80.0 - 100.0 fL   MCH 31.9 26.0 - 34.0 pg   MCHC 35.1 30.0 - 36.0 g/dL   RDW 01.6 01.0 - 93.2 %   Platelets 235 150 - 400 K/uL   nRBC 0.0 0.0 - 0.2 %   Neutrophils Relative % 76 %   Neutro Abs 6.6 1.7 - 7.7 K/uL   Lymphocytes Relative 12 %   Lymphs Abs 1.0 0.7 - 4.0 K/uL   Monocytes Relative 8 %   Monocytes Absolute 0.7 0.1 - 1.0 K/uL   Eosinophils Relative 3 %   Eosinophils Absolute 0.3 0.0 - 0.5 K/uL   Basophils Relative 1 %   Basophils Absolute 0.0 0.0 - 0.1 K/uL   Immature Granulocytes 0 %   Abs Immature Granulocytes 0.02 0.00 - 0.07 K/uL  Culture, blood (Routine X 2) w Reflex to ID Panel     Status: None (Preliminary result)   Collection Time: 04/29/23  8:39 PM   Specimen: BLOOD  Result Value Ref Range   Specimen Description      BLOOD SITE NOT SPECIFIED Performed at [facility], [address], [place], [place] [address]    Special Requests      BOTTLES DRAWN AEROBIC AND ANAEROBIC Blood Culture adequate volume Performed at [facility], [address], [place], [place] [address]    Culture      NO GROWTH < 12 HOURS Performed at [facility], [address] [place], [place] [address]    Report Status PENDING   Culture, blood (Routine X 2) w Reflex to ID Panel     Status: None (Preliminary result)   Collection Time: 04/29/23  8:45 PM   Specimen: BLOOD  Result Value Ref Range   Specimen Description      BLOOD RIGHT  ANTECUBITAL Performed at [facility], [address], [place], [place] [address]    Special Requests      BOTTLES DRAWN AEROBIC AND ANAEROBIC Blood Culture adequate volume Performed at [facility], [address], [place], [place] [address]  Culture      NO GROWTH < 12 HOURS Performed at [facility], [address] [place], [place] [address]    Report Status PENDING   I-Stat Lactic Acid, ED     Status: None   Collection Time: 04/29/23  8:49 PM  Result Value Ref Range   Lactic Acid, Venous 0.9 0.5 - 1.9 mmol/L  SARS Coronavirus 2 by RT PCR (hospital order, performed in [facility] hospital lab) *cepheid single result test*     Status: None   Collection Time: 04/29/23  9:01 PM   Specimen: Nasal Swab  Result Value Ref Range   SARS Coronavirus 2 by RT PCR NEGATIVE NEGATIVE  CBG monitoring, ED     Status: Abnormal   Collection Time: 04/29/23 11:46 PM  Result Value Ref Range   Glucose-Capillary 269 (H) 70 - 99 mg/dL  HIV Antibody (routine testing w rflx)     Status: None   Collection Time: 04/30/23  5:23 AM  Result Value Ref Range   HIV Screen 4th Generation wRfx Non Reactive Non Reactive  CBC     Status: None   Collection Time: 04/30/23  5:23 AM  Result Value Ref Range   WBC 6.8 4.0 - 10.5 K/uL   RBC 4.52 4.22 - 5.81 MIL/uL   Hemoglobin 14.4 13.0 - 17.0 g/dL   HCT 60.4 54.0 - 98.1 %   MCV 90.3 80.0 - 100.0 fL   MCH 31.9 26.0 - 34.0 pg   MCHC 35.3 30.0 - 36.0 g/dL   RDW 19.1 47.8 - 29.5 %   Platelets 235 150 - 400 K/uL   nRBC 0.0 0.0 - 0.2 %  Basic metabolic panel     Status: Abnormal   Collection Time: 04/30/23  5:23 AM  Result Value Ref Range   Sodium 133 (L) 135 - 145 mmol/L   Potassium 4.0 3.5 - 5.1 mmol/L   Chloride 100 98 - 111 mmol/L   CO2 22 22 - 32 mmol/L   Glucose, Bld 306 (H) 70 - 99 mg/dL   BUN 18 8 - 23 mg/dL   Creatinine, Ser 6.21 0.61 - 1.24 mg/dL   Calcium 8.7 (L) 8.9 - 10.3 mg/dL   GFR, Estimated >30 >86  mL/min   Anion gap 11 5 - 15  Procalcitonin     Status: None   Collection Time: 04/30/23  5:23 AM  Result Value Ref Range   Procalcitonin 0.12 ng/mL  Urinalysis, w/ Reflex to Culture (Infection Suspected) -Urine, Clean Catch     Status: Abnormal   Collection Time: 04/30/23  6:33 AM  Result Value Ref Range   Specimen Source URINE, CLEAN CATCH    Color, Urine YELLOW YELLOW   APPearance CLEAR CLEAR   Specific Gravity, Urine 1.025 1.005 - 1.030   pH 6.0 5.0 - 8.0   Glucose, UA >=500 (A) NEGATIVE mg/dL   Hgb urine dipstick NEGATIVE NEGATIVE   Bilirubin Urine NEGATIVE NEGATIVE   Ketones, ur 20 (A) NEGATIVE mg/dL   Protein, ur NEGATIVE NEGATIVE mg/dL   Nitrite NEGATIVE NEGATIVE   Leukocytes,Ua NEGATIVE NEGATIVE   RBC / HPF 0-5 0 - 5 RBC/hpf   WBC, UA 0-5 0 - 5 WBC/hpf   Bacteria, UA NONE SEEN NONE SEEN   Squamous Epithelial / HPF 0-5 0 - 5 /HPF   Mucus PRESENT   CBG monitoring, ED     Status: Abnormal   Collection Time: 04/30/23  8:38 AM  Result Value Ref Range  Glucose-Capillary 288 (H) 70 - 99 mg/dL   Comment 1 Notify RN     I have reviewed pertinent nursing notes, vitals, labs, and images as necessary. I have ordered labwork to follow up on as indicated.  I have reviewed the last notes from staff over past 24 hours. I have discussed patient's care plan and test results with nursing staff, CM/SW, and other staff as appropriate.  Time spent: Greater than 50% of the 55 minute visit was spent in counseling/coordination of care for the patient as laid out in the A&P.   LOS: 0 days   Lewie Chamber, MD [facility] 04/30/2023, 12:35 PM

## 2023-04-30 NOTE — Assessment & Plan Note (Signed)
-   Continue Norco as needed

## 2023-04-30 NOTE — Assessment & Plan Note (Signed)
-   Suspected right lower lobe infiltrate on CXR -No leukocytosis on admission - May be culprit for asthma exacerbation.  Procalcitonin equivocal - continue azitho at discharge

## 2023-04-30 NOTE — Assessment & Plan Note (Signed)
-   Not on oxygen at baseline - Etiology in setting of asthma exacerbation -Weaned off prior to discharge and completed walk test with no desaturation

## 2023-04-30 NOTE — Assessment & Plan Note (Signed)
-   Continue Flomax and Proscar

## 2023-04-30 NOTE — ED Notes (Signed)
Pt informed of need for urine sample, provided pt with urinal but pt unable to provide urine at this time. Pt reports it is normal for him to go awhile without urinating.

## 2023-04-30 NOTE — Assessment & Plan Note (Signed)
Continue aspirin and statin.

## 2023-04-30 NOTE — Assessment & Plan Note (Addendum)
-   Worsening cough and shortness of breath with associated wheezing for approximately 2 days prior to admission - No obvious sick contacts or precipitating factors - CXR shows possible infiltrates in right lower lobe -Continue breathing treatments, Mucinex - Continue steroids -Check RVP

## 2023-04-30 NOTE — Assessment & Plan Note (Signed)
-  Resume home regimen at discharge

## 2023-05-01 ENCOUNTER — Other Ambulatory Visit (HOSPITAL_COMMUNITY): Payer: Self-pay

## 2023-05-01 DIAGNOSIS — J45901 Unspecified asthma with (acute) exacerbation: Secondary | ICD-10-CM | POA: Diagnosis not present

## 2023-05-01 DIAGNOSIS — J9601 Acute respiratory failure with hypoxia: Secondary | ICD-10-CM | POA: Diagnosis not present

## 2023-05-01 DIAGNOSIS — J189 Pneumonia, unspecified organism: Secondary | ICD-10-CM | POA: Diagnosis not present

## 2023-05-01 LAB — GLUCOSE, CAPILLARY
Glucose-Capillary: 284 mg/dL — ABNORMAL HIGH (ref 70–99)
Glucose-Capillary: 322 mg/dL — ABNORMAL HIGH (ref 70–99)

## 2023-05-01 MED ORDER — CEFDINIR 300 MG PO CAPS
600.0000 mg | ORAL_CAPSULE | Freq: Every day | ORAL | 0 refills | Status: AC
  Filled 2023-05-01: qty 6, 3d supply, fill #0

## 2023-05-01 MED ORDER — TRAZODONE HCL 50 MG PO TABS
150.0000 mg | ORAL_TABLET | Freq: Once | ORAL | Status: AC
  Administered 2023-05-01: 150 mg via ORAL
  Filled 2023-05-01: qty 1

## 2023-05-01 MED ORDER — PREDNISONE 20 MG PO TABS
40.0000 mg | ORAL_TABLET | Freq: Every day | ORAL | 0 refills | Status: AC
  Filled 2023-05-01: qty 8, 4d supply, fill #0

## 2023-05-01 MED ORDER — AZITHROMYCIN 500 MG PO TABS
500.0000 mg | ORAL_TABLET | Freq: Every day | ORAL | 0 refills | Status: AC
  Filled 2023-05-01: qty 3, 3d supply, fill #0

## 2023-05-01 MED ORDER — CEFDINIR 300 MG PO CAPS
600.0000 mg | ORAL_CAPSULE | Freq: Every day | ORAL | Status: DC
  Administered 2023-05-01: 600 mg via ORAL
  Filled 2023-05-01: qty 2

## 2023-05-01 MED ORDER — AZITHROMYCIN 250 MG PO TABS
500.0000 mg | ORAL_TABLET | Freq: Every day | ORAL | Status: DC
  Administered 2023-05-01: 500 mg via ORAL
  Filled 2023-05-01: qty 2

## 2023-05-01 MED ORDER — INSULIN ASPART 100 UNIT/ML IJ SOLN
0.0000 [IU] | Freq: Three times a day (TID) | INTRAMUSCULAR | Status: DC
  Administered 2023-05-01: 11 [IU] via SUBCUTANEOUS
  Administered 2023-05-01: 15 [IU] via SUBCUTANEOUS

## 2023-05-01 MED ORDER — GUAIFENESIN-CODEINE 100-10 MG/5ML PO SYRP
5.0000 mL | ORAL_SOLUTION | Freq: Three times a day (TID) | ORAL | 0 refills | Status: DC | PRN
  Filled 2023-05-01: qty 120, 8d supply, fill #0

## 2023-05-01 NOTE — Progress Notes (Signed)
Reviewed d/c instructions with pt. All questions answered. Pt awaiting transport.

## 2023-05-01 NOTE — TOC Initial Note (Signed)
Transition of Care [facility]) - Initial/Assessment Note    Patient Details  Name: James [name] MRN: [id number] Date of Birth: January 11, 1956  Transition of Care [facility]) CM/SW Contact:    Adrian Prows, RN Phone Number: 05/01/2023, 10:24 AM  Clinical Narrative:                 Spoke w/ pt; he says he is from home w/ his wife [name] Casanas; he plans to return at d/c; pt has transportation; he denies SDOH risks; pt says he does not have DME, HH services, or home oxygen; no TOC needs.  Expected Discharge Plan: Home/Self Care Barriers to Discharge: No Barriers Identified   Patient Goals and CMS Choice Patient states their goals for this hospitalization and ongoing recovery are:: home          Expected Discharge Plan and Services   Discharge Planning Services: CM Consult Post Acute Care Choice: NA Living arrangements for the past 2 months: Single Family Home Expected Discharge Date: 05/01/23               DME Arranged: N/A DME Agency: NA       HH Arranged: NA HH Agency: NA        Prior Living Arrangements/Services Living arrangements for the past 2 months: Single Family Home Lives with:: Spouse Patient language and need for interpreter reviewed:: Yes Do you feel safe going back to the place where you live?: Yes      Need for Family Participation in Patient Care: Yes (Comment) Care giver support system in place?: Yes (comment) Current home services:  (n/a) Criminal Activity/Legal Involvement Pertinent to Current Situation/Hospitalization: No - Comment as needed  Activities of Daily Living   ADL Screening (condition at time of admission) Independently performs ADLs?: Yes (appropriate for developmental age) Is the patient deaf or have difficulty hearing?: No Does the patient have difficulty seeing, even when wearing glasses/contacts?: No Does the patient have difficulty concentrating, remembering, or making decisions?: No  Permission Sought/Granted Permission sought to  share information with : Case Manager Permission granted to share information with : Yes, Verbal Permission Granted  Share Information with NAME: Case Manager     Permission granted to share info w Relationship: [name] Cogbill (spouse) [phone]     Emotional Assessment Appearance:: Other (Comment Required (unable to assess) Attitude/Demeanor/Rapport: Gracious Affect (typically observed): Accepting Orientation: : Oriented to Self, Oriented to Place, Oriented to  Time, Oriented to Situation Alcohol / Substance Use: Not Applicable Psych Involvement: No (comment)  Admission diagnosis:  Acute respiratory failure with hypoxia (HCC) [J96.01] Community acquired pneumonia, unspecified laterality [J18.9] Acute asthma exacerbation [J45.901] Patient Active Problem List   Diagnosis Date Noted   Acute asthma exacerbation 04/30/2023   Acute respiratory failure with hypoxia (HCC) 04/29/2023   Chronic pain 04/29/2023   Hypothyroidism 04/29/2023   Autoimmune thyroiditis 12/10/2020   Benign prostatic hyperplasia with lower urinary tract symptoms 12/10/2020   Diastolic dysfunction 12/10/2020   Occlusion and stenosis of bilateral carotid arteries 12/10/2020   Bronchiectasis with (acute) exacerbation (HCC) 12/06/2019   Medication management 11/09/2019   Seizure disorder, secondary (HCC) 07/19/2019   Abnormal CT of the chest 07/19/2019   Asthma, chronic, unspecified asthma severity, with acute exacerbation 06/29/2019   Spinal stenosis, 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   Hypoglycemia due to insulin 04/20/2019   DM type 1 (diabetes mellitus, type 1) (HCC) 04/20/2019   Anemia 01/05/2019   Long term (current) use of insulin (HCC) 07/07/2017  HNP (herniated nucleus pulposus), cervical 06/24/2016   Syncope 05/20/2016   Tobacco abuse 05/20/2016   Cervical disc disorder with radiculopathy of cervical region 05/20/2016   CAP (community acquired pneumonia) 12/22/2015   Major  depression, single episode 02/12/2015   Aspiration pneumonia (HCC) 02/12/2014   Sleep disorder 10/30/2012   Peripheral vascular disease (HCC) 06/25/2011   Gastroparesis 02/10/2011   Esophageal reflux 12/21/2010   TOBACCO USE, QUIT 05/08/2010   CONTUSION, RIGHT CHEST WALL 04/07/2010   Chronic asthmatic bronchitis (HCC) 10/25/2008   Asthma, persistent controlled 09/06/2008   Cough 09/06/2008   SHOULDER PAIN, RIGHT 08/19/2008   BLADDER OUTLET OBSTRUCTION 04/15/2008   RASH AND OTHER NONSPECIFIC SKIN ERUPTION 02/21/2008   CARPAL TUNNEL SYNDROME, BILATERAL 07/31/2007   Allergic rhinitis 07/31/2007   RENAL INSUFFICIENCY 07/31/2007   OTHER TENOSYNOVITIS OF HAND AND WRIST 07/31/2007   DM W/RENAL MNFST, TYPE I, UNCONTROLLED 04/04/2007   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 [place] [address] Phone: [phone] Fax: [phone]  [id number] [address] [facility] [place] [address] [place] [address] Phone: [phone] Fax: [phone]     Social Determinants of Health (SDOH) Social History: SDOH Screenings   Food Insecurity: No Food Insecurity (05/01/2023)  Housing: Low Risk  (05/01/2023)  Transportation Needs: No Transportation Needs (05/01/2023)  Utilities: Not At Risk (05/01/2023)  Tobacco Use: Medium Risk (04/29/2023)   SDOH Interventions: Food Insecurity Interventions: Intervention Not Indicated, Inpatient TOC Housing Interventions: Intervention Not Indicated, Inpatient TOC Transportation Interventions: Intervention Not Indicated, Inpatient  TOC Utilities Interventions: Intervention Not Indicated, Inpatient TOC   Readmission Risk Interventions     No data to display

## 2023-05-01 NOTE — Progress Notes (Signed)
Mobility Specialist - Progress Note   05/01/23 0926  Oxygen Therapy  SpO2 90 %  O2 Device Room Air  Mobility  Activity Ambulated independently in hallway  Level of Assistance Independent  Distance Ambulated (ft) 500 ft  Activity Response Tolerated well  Mobility Referral Yes  $Mobility charge 1 Mobility  Mobility Specialist Start Time (ACUTE ONLY) 0914  Mobility Specialist Stop Time (ACUTE ONLY) 0925  Mobility Specialist Time Calculation (min) (ACUTE ONLY) 11 min   Nurse requested Mobility Specialist to perform oxygen saturation test with pt which includes removing pt from oxygen both at rest and while ambulating.  Below are the results from that testing.     Patient Saturations on Room Air at Rest = spO2 93%  Patient Saturations on Room Air while Ambulating = sp02 90% .    At end of testing pt left in room on 0  Liters of oxygen.  Reported results to nurse. Pt received in bed and agreeable to mobility. No complaints during session. Pt to bed after session with all needs met.       Pre-mobility: 93% SpO2 During mobility: 90 %SpO2 Post-mobility: 91% SPO2  Chief Technology Officer

## 2023-05-01 NOTE — Discharge Summary (Signed)
Physician Discharge Summary   James [name] ZOX:[id number] DOB: 1955/08/09 DOA: 04/29/2023  PCP: Adrian Prince, MD  Admit date: 04/29/2023 Discharge date: 05/01/2023  Admitted From: Home Disposition:  Home Discharging physician: Lewie Chamber, MD Barriers to discharge: none   Discharge Condition: stable CODE STATUS: Full Diet recommendation:  Diet Orders (From admission, onward)     Start     Ordered   05/01/23 0000  Diet Carb Modified        05/01/23 0951   04/29/23 2256  Diet Carb Modified Fluid consistency: Thin; Room service appropriate? Yes  Diet effective now       Question Answer Comment  Diet-HS Snack? Nothing   Calorie Level Medium 1600-2000   Fluid consistency: Thin   Room service appropriate? Yes      04/29/23 2257            Hospital Course: James [name] is a 67 y.o. male with medical history significant for chronic asthmatic bronchitis/bronchiectasis, insulin-dependent diabetes with retinopathy, CAD, HLD, hypothyroidism, BPH, chronic pain who is admitted with acute asthma exacerbation. He also required oxygen briefly and was able to be weaned off prior to discharge.  He ambulated well with no desaturations. He was discharged on prednisone and azithromycin to complete courses.  Assessment and Plan: * Asthma, chronic, unspecified asthma severity, with acute exacerbation - Worsening cough and shortness of breath with associated wheezing for approximately 2 days prior to admission - No obvious sick contacts or precipitating factors - CXR shows possible infiltrates in right lower lobe - Continue breathing treatments, Mucinex - Continue steroids - RVP negative  CAP (community acquired pneumonia) - Suspected right lower lobe infiltrate on CXR -No leukocytosis on admission - May be culprit for asthma exacerbation.  Procalcitonin equivocal - continue azitho at discharge   Acute respiratory failure with hypoxia (HCC)-resolved as of 05/01/2023 - Not on oxygen  at baseline - Etiology in setting of asthma exacerbation -Weaned off prior to discharge and completed walk test with no desaturation  Hypothyroidism - Continue Synthroid  Chronic pain - Continue Norco as needed  Benign prostatic hyperplasia with lower urinary tract symptoms - Continue Flomax and Proscar  DM type 1 (diabetes mellitus, type 1) (HCC) - Resume home regimen at discharge  CAD (coronary artery disease) - Continue aspirin and statin   The patient's acute and chronic medical conditions were treated accordingly. On Stjulien of discharge, patient was felt deemed stable for discharge. Patient/family member advised to call PCP or come back to ER if needed.   Principal Diagnosis: Asthma, chronic, unspecified asthma severity, with acute exacerbation  Discharge Diagnoses: Active Hospital Problems   Diagnosis Date Noted   Asthma, chronic, unspecified asthma severity, with acute exacerbation 06/29/2019    Priority: 1.   CAP (community acquired pneumonia) 12/22/2015    Priority: 2.   Acute asthma exacerbation 04/30/2023   Chronic pain 04/29/2023   Hypothyroidism 04/29/2023   Benign prostatic hyperplasia with lower urinary tract symptoms 12/10/2020   DM type 1 (diabetes mellitus, type 1) (HCC) 04/20/2019   CAD (coronary artery disease) 04/03/2007    Resolved Hospital Problems   Diagnosis Date Noted Date Resolved   Acute respiratory failure with hypoxia (HCC) 04/29/2023 05/01/2023    Priority: 2.     Discharge Instructions     Diet Carb Modified   Complete by: As directed    Increase activity slowly   Complete by: As directed       Allergies as of 05/01/2023  Reactions   Augmentin [amoxicillin-pot Clavulanate] Nausea And Vomiting, Other (See Comments)   Did it involve swelling of the face/tongue/throat, SOB, or low BP? No Did it involve sudden or severe rash/hives, skin peeling, or any reaction on the inside of your mouth or nose? No Did you need to seek medical  attention at a hospital or doctor's office? No When did it last happen?      5+ years If all above answers are "NO", may proceed with cephalosporin use.   Lexapro [escitalopram Oxalate] Itching        Medication List     STOP taking these medications    benzonatate 100 MG capsule Commonly known as: TESSALON   cyclobenzaprine 5 MG tablet Commonly known as: FLEXERIL   doxycycline 100 MG tablet Commonly known as: VIBRA-TABS   Glucagon Emergency 1 MG Kit   NovoLOG FlexPen 100 UNIT/ML FlexPen Generic drug: insulin aspart   ondansetron 4 MG disintegrating tablet Commonly known as: ZOFRAN-ODT   orphenadrine 100 MG tablet Commonly known as: NORFLEX   varenicline 1 MG tablet Commonly known as: CHANTIX       TAKE these medications    acetaminophen 500 MG tablet Commonly known as: TYLENOL Take 1,000 mg by mouth every 6 (six) hours as needed for mild pain or headache.   albuterol (2.5 MG/3ML) 0.083% nebulizer solution Commonly known as: PROVENTIL Take 3 mLs by nebulization every 6 (six) hours as needed for wheezing or shortness of breath.   albuterol 108 (90 Base) MCG/ACT inhaler Commonly known as: VENTOLIN HFA INHALE 2 PUFFS BY MOUTH INTO THE LUNGS EVERY 6 HOURS AS NEEDED FOR WHEEZING OR SHORTNESS OF BREATH.   aspirin EC 81 MG tablet Take 1 tablet (81 mg total) by mouth daily. Swallow whole. What changed: when to take this   azithromycin 500 MG tablet Commonly known as: ZITHROMAX Take 1 tablet (500 mg total) by mouth daily for 3 days. Start taking on: May 02, 2023   cefdinir 300 MG capsule Commonly known as: OMNICEF Take 2 capsules (600 mg total) by mouth daily for 3 days. Start taking on: May 02, 2023 What changed:  how much to take when to take this   cetirizine 10 MG tablet Commonly known as: ZYRTEC Take 10 mg by mouth at bedtime.   Contour Next Test test strip Generic drug: glucose blood USE 8 TIMES DAILY AS DIRECTED TO MONITOR BLOOD  GLUCOSE   Fiasp FlexTouch 100 UNIT/ML FlexTouch Pen Generic drug: insulin aspart INJECT UP TO 100 UNITS A Meanor (ICR 20, ISF 20, target 120)   finasteride 5 MG tablet Commonly known as: PROSCAR Take 1 tablet (5 mg total) by mouth daily.   fluticasone 50 MCG/ACT nasal spray Commonly known as: FLONASE Place 2 sprays into both nostrils daily. What changed:  when to take this reasons to take this   FreeStyle Libre 3 Sensor Misc Change sensor every 14 days to monitor blood glucose continously   guaiFENesin-codeine 100-10 MG/5ML syrup Commonly known as: ROBITUSSIN AC Take 5 mLs by mouth 3 (three) times daily as needed for cough. What changed: Another medication with the same name was removed. Continue taking this medication, and follow the directions you see here.   Gvoke HypoPen 2-Pack 1 MG/0.2ML Soaj Generic drug: Glucagon Inject under the skin as directed for severe hypoglycemia.   HYDROcodone-acetaminophen 5-325 MG tablet Commonly known as: NORCO/VICODIN Take 1 tablet by mouth 3 (three) times daily as needed.   ibuprofen 200 MG tablet Commonly known as:  ADVIL Take 800 mg by mouth every 8 (eight) hours as needed (pain.).   levothyroxine 100 MCG tablet Commonly known as: SYNTHROID Take 1 tablet (100 mcg total) by mouth daily.   metoCLOPramide 5 MG tablet Commonly known as: REGLAN Take 1 tablet (5 mg total) by mouth 2 (two) times daily before meals   pantoprazole 40 MG tablet Commonly known as: PROTONIX Take 1 tablet by mouth daily.   predniSONE 20 MG tablet Commonly known as: DELTASONE Take 2 tablets (40 mg total) by mouth daily with breakfast for 4 days. Start taking on: May 02, 2023   promethazine 25 MG tablet Commonly known as: PHENERGAN Take 1 tablet (25 mg total) by mouth 3 (three) times daily as needed. What changed: Another medication with the same name was removed. Continue taking this medication, and follow the directions you see here.   simvastatin  40 MG tablet Commonly known as: ZOCOR Take 1 tablet (40 mg total) by mouth daily.   tamsulosin 0.4 MG Caps capsule Commonly known as: FLOMAX Take 1 capsule (0.4 mg total) by mouth daily.   TechLite Pen Needles 31G X 8 MM Misc Generic drug: Insulin Pen Needle Use 1 pen needle subcutaneously 4 - 5 times daily as directed.   B-D ULTRAFINE III SHORT PEN 31G X 8 MM Misc Generic drug: Insulin Pen Needle Use 1 pen needle 4-5 times daily as directed.   traZODone 150 MG tablet Commonly known as: DESYREL Take 1 tablet (150 mg total) by mouth at bedtime as needed for sleep   Tresiba FlexTouch 100 UNIT/ML FlexTouch Pen Generic drug: insulin degludec Inject 40 Units into the skin daily. What changed:  how much to take when to take this        Allergies  Allergen Reactions   Augmentin [Amoxicillin-Pot Clavulanate] Nausea And Vomiting and Other (See Comments)    Did it involve swelling of the face/tongue/throat, SOB, or low BP? No Did it involve sudden or severe rash/hives, skin peeling, or any reaction on the inside of your mouth or nose? No Did you need to seek medical attention at a hospital or doctor's office? No When did it last happen?      5+ years If all above answers are "NO", may proceed with cephalosporin use.    Lexapro [Escitalopram Oxalate] Itching    Consultations:   Procedures:   Discharge Exam: BP (!) 125/58 (BP Location: Left Arm)   Pulse 86   Temp 97.9 F (36.6 C) (Oral)   Resp 18   Ht 5\' 9"  (1.753 m)   Wt 77.3 kg   SpO2 90%   BMI 25.17 kg/m  Physical Exam Constitutional:      General: He is not in acute distress.    Appearance: Normal appearance.  HENT:     Head: Normocephalic and atraumatic.     Mouth/Throat:     Mouth: Mucous membranes are moist.  Eyes:     Extraocular Movements: Extraocular movements intact.  Cardiovascular:     Rate and Rhythm: Normal rate and regular rhythm.  Pulmonary:     Effort: Pulmonary effort is normal. No  respiratory distress.     Breath sounds: Wheezing (improved) and rhonchi present.  Abdominal:     General: Bowel sounds are normal. There is no distension.     Palpations: Abdomen is soft.     Tenderness: There is no abdominal tenderness.  Musculoskeletal:        General: Normal range of motion.  Cervical back: Normal range of motion and neck supple.  Skin:    General: Skin is warm and dry.  Neurological:     General: No focal deficit present.     Mental Status: He is alert.  Psychiatric:        Mood and Affect: Mood normal.        Behavior: Behavior normal.      The results of significant diagnostics from this hospitalization (including imaging, microbiology, ancillary and laboratory) are listed below for reference.   Microbiology: Recent Results (from the past 240 hour(s))  Respiratory (~20 pathogens) panel by PCR     Status: None   Collection Time: 04/29/23  1:22 PM   Specimen: Nasopharyngeal Swab; Respiratory  Result Value Ref Range Status   Adenovirus NOT DETECTED NOT DETECTED Final   Coronavirus 229E NOT DETECTED NOT DETECTED Final    Comment: (NOTE) The Coronavirus on the Respiratory Panel, DOES NOT test for the novel  Coronavirus (2019 nCoV)    Coronavirus HKU1 NOT DETECTED NOT DETECTED Final   Coronavirus NL63 NOT DETECTED NOT DETECTED Final   Coronavirus OC43 NOT DETECTED NOT DETECTED Final   Metapneumovirus NOT DETECTED NOT DETECTED Final   Rhinovirus / Enterovirus NOT DETECTED NOT DETECTED Final   Influenza A NOT DETECTED NOT DETECTED Final   Influenza B NOT DETECTED NOT DETECTED Final   Parainfluenza Virus 1 NOT DETECTED NOT DETECTED Final   Parainfluenza Virus 2 NOT DETECTED NOT DETECTED Final   Parainfluenza Virus 3 NOT DETECTED NOT DETECTED Final   Parainfluenza Virus 4 NOT DETECTED NOT DETECTED Final   Respiratory Syncytial Virus NOT DETECTED NOT DETECTED Final   Bordetella pertussis NOT DETECTED NOT DETECTED Final   Bordetella Parapertussis NOT  DETECTED NOT DETECTED Final   Chlamydophila pneumoniae NOT DETECTED NOT DETECTED Final   Mycoplasma pneumoniae NOT DETECTED NOT DETECTED Final    Comment: Performed at [facility], [address], [place], [place] [address]  Culture, blood (Routine X 2) w Reflex to ID Panel     Status: None (Preliminary result)   Collection Time: 04/29/23  8:39 PM   Specimen: BLOOD  Result Value Ref Range Status   Specimen Description   Final    BLOOD SITE NOT SPECIFIED Performed at [facility], [address], [place], [place] [address]    Special Requests   Final    BOTTLES DRAWN AEROBIC AND ANAEROBIC Blood Culture adequate volume Performed at [facility], [address] [place], [place] [address]    Culture   Final    NO GROWTH 2 DAYS Performed at [facility], [address] [place], [place] [address]    Report Status PENDING  Incomplete  Culture, blood (Routine X 2) w Reflex to ID Panel     Status: None (Preliminary result)   Collection Time: 04/29/23  8:45 PM   Specimen: BLOOD  Result Value Ref Range Status   Specimen Description   Final    BLOOD RIGHT ANTECUBITAL Performed at [facility], [address] [place], [place] [address]    Special Requests   Final    BOTTLES DRAWN AEROBIC AND ANAEROBIC Blood Culture adequate volume Performed at [facility], [address], [place], [place] [address]    Culture   Final    NO GROWTH 2 DAYS Performed at [facility], [address] [place], [place] [address]    Report Status PENDING  Incomplete  SARS Coronavirus 2 by RT PCR (hospital order, performed in [facility] hospital lab) *cepheid single result test*     Status: None   Collection Time: 04/29/23  9:01 PM   Specimen: Nasal Swab  Result Value Ref Range Status   SARS Coronavirus 2 by RT PCR NEGATIVE NEGATIVE Final    Comment: (NOTE) SARS-CoV-2 target nucleic acids are NOT DETECTED.  The  SARS-CoV-2 RNA is generally detectable in upper and lower respiratory specimens during the acute phase of infection. The lowest concentration of SARS-CoV-2 viral copies this assay can detect is 250 copies / mL. A negative result does not preclude SARS-CoV-2 infection and should not be used as the sole basis for treatment or other patient management decisions.  A negative result may occur with improper specimen collection / handling, submission of specimen other than nasopharyngeal swab, presence of viral mutation(s) within the areas targeted by this assay, and inadequate number of viral copies (<250 copies / mL). A negative result must be combined with clinical observations, patient history, and epidemiological information.  Fact Sheet for Patients:   RoadLapTop.co.za  Fact Sheet for Healthcare Providers: http://kim-miller.com/  This test is not yet approved or  cleared by the [place] FDA and has been authorized for detection and/or diagnosis of SARS-CoV-2 by FDA under an Emergency Use Authorization (EUA).  This EUA will remain in effect (meaning this test can be used) for the duration of the COVID-19 declaration under Section 564(b)(1) of the Act, 21 U.S.C. section 360bbb-3(b)(1), unless the authorization is terminated or revoked sooner.  Performed at [facility], [address], [place], [place] [address]      Labs: BNP (last 3 results) No results for input(s): "BNP" in the last 8760 hours. Basic Metabolic Panel: Recent Labs  Lab 04/29/23 2039 04/30/23 0523  NA 135 133*  K 3.7 4.0  CL 104 100  CO2 23 22  GLUCOSE 232* 306*  BUN 13 18  CREATININE 0.73 0.78  CALCIUM 8.3* 8.7*   Liver Function Tests: Recent Labs  Lab 04/29/23 2039  AST 11*  ALT 16  ALKPHOS 87  BILITOT 1.2  PROT 6.5  ALBUMIN 3.6   No results for input(s): "LIPASE", "AMYLASE" in the last 168 hours. No results for input(s):  "AMMONIA" in the last 168 hours. CBC: Recent Labs  Lab 04/29/23 2039 04/30/23 0523  WBC 8.7 6.8  NEUTROABS 6.6  --   HGB 13.9 14.4  HCT 39.6 40.8  MCV 90.8 90.3  PLT 235 235   Cardiac Enzymes: No results for input(s): "CKTOTAL", "CKMB", "CKMBINDEX", "TROPONINI" in the last 168 hours. BNP: Invalid input(s): "POCBNP" CBG: Recent Labs  Lab 04/30/23 1307 04/30/23 1642 04/30/23 2138 05/01/23 0744 05/01/23 1129  GLUCAP 343* 227* 227* 284* 322*   D-Dimer No results for input(s): "DDIMER" in the last 72 hours. Hgb A1c No results for input(s): "HGBA1C" in the last 72 hours. Lipid Profile No results for input(s): "CHOL", "HDL", "LDLCALC", "TRIG", "CHOLHDL", "LDLDIRECT" in the last 72 hours. Thyroid function studies No results for input(s): "TSH", "T4TOTAL", "T3FREE", "THYROIDAB" in the last 72 hours.  Invalid input(s): "FREET3" Anemia work up No results for input(s): "VITAMINB12", "FOLATE", "FERRITIN", "TIBC", "IRON", "RETICCTPCT" in the last 72 hours. Urinalysis    Component Value Date/Time   COLORURINE YELLOW 04/30/2023 0633   APPEARANCEUR CLEAR 04/30/2023 0633   LABSPEC 1.025 04/30/2023 0633   PHURINE 6.0 04/30/2023 0633   GLUCOSEU >=500 (A) 04/30/2023 0633   GLUCOSEU 500 (?) 05/08/2010 1434  HGBUR NEGATIVE 04/30/2023 0633   BILIRUBINUR NEGATIVE 04/30/2023 0633   KETONESUR 20 (A) 04/30/2023 0633   PROTEINUR NEGATIVE 04/30/2023 0633   UROBILINOGEN 0.2 04/11/2013 0008   NITRITE NEGATIVE 04/30/2023 0633   LEUKOCYTESUR NEGATIVE 04/30/2023 0633   Sepsis Labs Recent Labs  Lab 04/29/23 2039 04/30/23 0523  WBC 8.7 6.8   Microbiology Recent Results (from the past 240 hour(s))  Respiratory (~20 pathogens) panel by PCR     Status: None   Collection Time: 04/29/23  1:22 PM   Specimen: Nasopharyngeal Swab; Respiratory  Result Value Ref Range Status   Adenovirus NOT DETECTED NOT DETECTED Final   Coronavirus 229E NOT DETECTED NOT DETECTED Final    Comment:  (NOTE) The Coronavirus on the Respiratory Panel, DOES NOT test for the novel  Coronavirus (2019 nCoV)    Coronavirus HKU1 NOT DETECTED NOT DETECTED Final   Coronavirus NL63 NOT DETECTED NOT DETECTED Final   Coronavirus OC43 NOT DETECTED NOT DETECTED Final   Metapneumovirus NOT DETECTED NOT DETECTED Final   Rhinovirus / Enterovirus NOT DETECTED NOT DETECTED Final   Influenza A NOT DETECTED NOT DETECTED Final   Influenza B NOT DETECTED NOT DETECTED Final   Parainfluenza Virus 1 NOT DETECTED NOT DETECTED Final   Parainfluenza Virus 2 NOT DETECTED NOT DETECTED Final   Parainfluenza Virus 3 NOT DETECTED NOT DETECTED Final   Parainfluenza Virus 4 NOT DETECTED NOT DETECTED Final   Respiratory Syncytial Virus NOT DETECTED NOT DETECTED Final   Bordetella pertussis NOT DETECTED NOT DETECTED Final   Bordetella Parapertussis NOT DETECTED NOT DETECTED Final   Chlamydophila pneumoniae NOT DETECTED NOT DETECTED Final   Mycoplasma pneumoniae NOT DETECTED NOT DETECTED Final    Comment: Performed at [facility], [address], [place], [place] [address]  Culture, blood (Routine X 2) w Reflex to ID Panel     Status: None (Preliminary result)   Collection Time: 04/29/23  8:39 PM   Specimen: BLOOD  Result Value Ref Range Status   Specimen Description   Final    BLOOD SITE NOT SPECIFIED Performed at [facility], [address], [place], [place] [address]    Special Requests   Final    BOTTLES DRAWN AEROBIC AND ANAEROBIC Blood Culture adequate volume Performed at [facility], [address], [place], [place] [address]    Culture   Final    NO GROWTH 2 DAYS Performed at [facility], [address] [place], [place] [address]    Report Status PENDING  Incomplete  Culture, blood (Routine X 2) w Reflex to ID Panel     Status: None (Preliminary result)   Collection Time: 04/29/23  8:45 PM   Specimen: BLOOD  Result Value Ref Range Status   Specimen  Description   Final    BLOOD RIGHT ANTECUBITAL Performed at [facility], [address], [place], [place] [address]    Special Requests   Final    BOTTLES DRAWN AEROBIC AND ANAEROBIC Blood Culture adequate volume Performed at [facility], [address], [place], [place] [address]    Culture   Final    NO GROWTH 2 DAYS Performed at [facility], [address], [place], [place] [address]    Report Status PENDING  Incomplete  SARS Coronavirus 2 by RT PCR (hospital order, performed in [facility] hospital lab) *cepheid single result test*     Status: None   Collection Time: 04/29/23  9:01 PM   Specimen: Nasal Swab  Result Value Ref Range Status   SARS Coronavirus 2 by RT PCR NEGATIVE NEGATIVE Final    Comment: (NOTE) SARS-CoV-2 target nucleic acids are NOT DETECTED.  The SARS-CoV-2 RNA is generally detectable in upper and lower respiratory specimens during the acute phase of infection. The lowest concentration of SARS-CoV-2 viral copies this assay can detect is 250 copies / mL. A negative result does not preclude SARS-CoV-2 infection and should not be used as the sole basis for treatment or other patient management decisions.  A negative result may occur with improper specimen collection / handling, submission of specimen other than nasopharyngeal swab, presence of viral mutation(s) within the areas targeted by this assay, and inadequate number of viral copies (<250 copies / mL). A negative result must be combined with clinical observations, patient history, and epidemiological information.  Fact Sheet for Patients:   RoadLapTop.co.za  Fact Sheet for Healthcare Providers: http://kim-miller.com/  This test is not yet approved or  cleared by the [place] FDA and has been authorized for detection and/or diagnosis of SARS-CoV-2 by FDA under an Emergency Use Authorization (EUA).  This EUA  will remain in effect (meaning this test can be used) for the duration of the COVID-19 declaration under Section 564(b)(1) of the Act, 21 U.S.C. section 360bbb-3(b)(1), unless the authorization is terminated or revoked sooner.  Performed at [facility], [address], [place], [place] [address]     Procedures/Studies: DG Chest 2 View  Result Date: 04/29/2023 CLINICAL DATA:  Shortness of breath, wheezing, and rhonchi. EXAM: CHEST - 2 VIEW COMPARISON:  11/18/2022. FINDINGS: The heart size and mediastinal contours are within normal limits. Airspace disease is present in the right lower lobe. No consolidation, effusion, or pneumothorax. No acute osseous abnormality. IMPRESSION: Airspace disease in the right lower lobe, possible atelectasis or infiltrate. Electronically Signed   By: Thornell Sartorius M.D.   On: 04/29/2023 22:19     Time coordinating discharge: Over 30 minutes    Lewie Chamber, MD  [facility] 05/01/2023, 11:45 AM

## 2023-05-02 ENCOUNTER — Other Ambulatory Visit (HOSPITAL_COMMUNITY): Payer: Self-pay

## 2023-05-02 MED ORDER — CYCLOBENZAPRINE HCL 5 MG PO TABS
5.0000 mg | ORAL_TABLET | Freq: Two times a day (BID) | ORAL | 5 refills | Status: DC
Start: 2023-05-02 — End: 2024-02-23
  Filled 2023-05-02: qty 60, 30d supply, fill #0
  Filled 2023-06-11: qty 60, 30d supply, fill #1
  Filled 2023-08-01: qty 60, 30d supply, fill #2
  Filled 2023-09-01: qty 60, 30d supply, fill #3
  Filled 2023-10-10 (×2): qty 60, 30d supply, fill #4
  Filled 2023-11-12 – 2023-11-19 (×2): qty 60, 30d supply, fill #5

## 2023-05-03 ENCOUNTER — Other Ambulatory Visit (HOSPITAL_COMMUNITY): Payer: Self-pay

## 2023-05-04 LAB — CULTURE, BLOOD (ROUTINE X 2)
Culture: NO GROWTH
Culture: NO GROWTH
Special Requests: ADEQUATE
Special Requests: ADEQUATE

## 2023-05-09 ENCOUNTER — Other Ambulatory Visit (HOSPITAL_COMMUNITY): Payer: Self-pay

## 2023-05-09 MED ORDER — METOCLOPRAMIDE HCL 5 MG PO TABS
5.0000 mg | ORAL_TABLET | Freq: Two times a day (BID) | ORAL | 4 refills | Status: DC
  Filled 2023-05-09: qty 180, 90d supply, fill #0
  Filled 2024-02-18: qty 180, 90d supply, fill #1

## 2023-05-21 ENCOUNTER — Other Ambulatory Visit (HOSPITAL_COMMUNITY): Payer: Self-pay

## 2023-05-23 ENCOUNTER — Other Ambulatory Visit (HOSPITAL_COMMUNITY): Payer: Self-pay

## 2023-05-23 ENCOUNTER — Telehealth: Payer: Self-pay | Admitting: Pulmonary Disease

## 2023-05-23 MED ORDER — HYDROCODONE-ACETAMINOPHEN 5-325 MG PO TABS
ORAL_TABLET | ORAL | 0 refills | Status: DC
  Filled 2023-05-23: qty 60, 20d supply, fill #0

## 2023-05-23 NOTE — Telephone Encounter (Signed)
Lm x1 for patient.  

## 2023-05-23 NOTE — Telephone Encounter (Signed)
PT needs Dr. Vassie Loll, not Ms. Cobb, to refill his neb solution. IPRATROPIUM- Albuterol.  Ms. Allison Quarry dcln'd it and replaced it w/something else and he does not know what that is. That is why he wants Dr. Vassie Loll to be contacted.   [facility]  His # is [phone]  He is still sick from Asthma exacerbation so setting as High Priority.

## 2023-05-24 NOTE — Telephone Encounter (Signed)
PT ret Margie's call. Please try again.

## 2023-05-24 NOTE — Telephone Encounter (Signed)
Called and spoke to patient. He stated that he is running low on Duoneb. When he reached out to the pharmacy for a refill, he was told that duoneb was d/c'd by Rhunette Croft. He is questioning which solution he should be on. According to katie's last note from 2023, pt was to continue either albuterol or duoneb. I do not see mentioning of this on the office note from 03/29/23. Preferred pharmacy is [facility].   Dr. Vassie Loll, please advise. Thanks

## 2023-05-24 NOTE — Telephone Encounter (Signed)
Called and spoke to patient and relayed below message/recommendations. He stated that he has albuterol at home. No Rx is needed. Nothing further needed.

## 2023-05-31 ENCOUNTER — Other Ambulatory Visit (HOSPITAL_COMMUNITY): Payer: Self-pay

## 2023-05-31 MED ORDER — GVOKE HYPOPEN 2-PACK 1 MG/0.2ML ~~LOC~~ SOAJ
SUBCUTANEOUS | 5 refills | Status: AC
  Filled 2023-05-31: qty 0.4, 30d supply, fill #0

## 2023-05-31 MED FILL — Continuous Glucose System Sensor: 30 days supply | Qty: 2 | Fill #0 | Status: CN

## 2023-06-01 ENCOUNTER — Other Ambulatory Visit: Payer: Self-pay

## 2023-06-02 DIAGNOSIS — N5089 Other specified disorders of the male genital organs: Secondary | ICD-10-CM | POA: Insufficient documentation

## 2023-06-06 ENCOUNTER — Other Ambulatory Visit (HOSPITAL_COMMUNITY): Payer: Self-pay

## 2023-06-06 ENCOUNTER — Other Ambulatory Visit: Payer: Self-pay | Admitting: Surgery

## 2023-06-06 DIAGNOSIS — N5089 Other specified disorders of the male genital organs: Secondary | ICD-10-CM

## 2023-06-13 ENCOUNTER — Other Ambulatory Visit (HOSPITAL_COMMUNITY): Payer: Self-pay

## 2023-06-15 ENCOUNTER — Other Ambulatory Visit (HOSPITAL_COMMUNITY): Payer: Self-pay

## 2023-06-20 ENCOUNTER — Other Ambulatory Visit (HOSPITAL_COMMUNITY): Payer: Self-pay

## 2023-06-25 ENCOUNTER — Other Ambulatory Visit (HOSPITAL_COMMUNITY): Payer: Self-pay

## 2023-06-27 ENCOUNTER — Other Ambulatory Visit (HOSPITAL_COMMUNITY): Payer: Self-pay

## 2023-06-27 MED ORDER — HYDROCODONE-ACETAMINOPHEN 5-325 MG PO TABS
1.0000 | ORAL_TABLET | Freq: Three times a day (TID) | ORAL | 0 refills | Status: DC | PRN
  Filled 2023-06-27: qty 60, 20d supply, fill #0

## 2023-06-28 ENCOUNTER — Other Ambulatory Visit (HOSPITAL_COMMUNITY): Payer: Self-pay

## 2023-06-30 ENCOUNTER — Other Ambulatory Visit (HOSPITAL_COMMUNITY): Payer: Self-pay

## 2023-06-30 ENCOUNTER — Other Ambulatory Visit: Payer: Self-pay | Admitting: Nurse Practitioner

## 2023-06-30 DIAGNOSIS — J4541 Moderate persistent asthma with (acute) exacerbation: Secondary | ICD-10-CM

## 2023-07-17 ENCOUNTER — Ambulatory Visit
Admission: RE | Admit: 2023-07-17 | Discharge: 2023-07-17 | Disposition: A | Discharge status: Home / Self Care | Payer: Medicare Other | Source: Ambulatory Visit | Attending: Surgery | Admitting: Surgery

## 2023-07-17 DIAGNOSIS — N5089 Other specified disorders of the male genital organs: Secondary | ICD-10-CM

## 2023-07-17 MED ORDER — GADOPICLENOL 0.5 MMOL/ML IV SOLN
7.5000 mL | Freq: Once | INTRAVENOUS | Status: AC | PRN
  Administered 2023-07-17: 7.5 mL via INTRAVENOUS

## 2023-07-18 ENCOUNTER — Ambulatory Visit (HOSPITAL_BASED_OUTPATIENT_CLINIC_OR_DEPARTMENT_OTHER)
Admission: RE | Admit: 2023-07-18 | Discharge: 2023-07-18 | Disposition: A | Discharge status: Home / Self Care | Payer: Medicare Other | Source: Ambulatory Visit | Attending: Endocrinology | Admitting: Endocrinology

## 2023-07-18 DIAGNOSIS — Z87891 Personal history of nicotine dependence: Secondary | ICD-10-CM | POA: Insufficient documentation

## 2023-07-18 DIAGNOSIS — Z122 Encounter for screening for malignant neoplasm of respiratory organs: Secondary | ICD-10-CM | POA: Diagnosis present

## 2023-07-27 ENCOUNTER — Other Ambulatory Visit (HOSPITAL_COMMUNITY): Payer: Self-pay

## 2023-07-27 ENCOUNTER — Other Ambulatory Visit: Payer: Self-pay

## 2023-07-27 MED ORDER — SIMVASTATIN 40 MG PO TABS
40.0000 mg | ORAL_TABLET | Freq: Every day | ORAL | 3 refills | Status: AC
  Filled 2023-07-27: qty 90, 90d supply, fill #0
  Filled 2023-11-16: qty 90, 90d supply, fill #1
  Filled 2024-02-18: qty 90, 90d supply, fill #2
  Filled 2024-06-01: qty 90, 90d supply, fill #3

## 2023-07-29 ENCOUNTER — Other Ambulatory Visit (HOSPITAL_COMMUNITY): Payer: Self-pay

## 2023-07-29 ENCOUNTER — Other Ambulatory Visit: Payer: Self-pay

## 2023-07-29 DIAGNOSIS — F17210 Nicotine dependence, cigarettes, uncomplicated: Secondary | ICD-10-CM

## 2023-07-29 DIAGNOSIS — Z87891 Personal history of nicotine dependence: Secondary | ICD-10-CM

## 2023-07-29 DIAGNOSIS — Z122 Encounter for screening for malignant neoplasm of respiratory organs: Secondary | ICD-10-CM

## 2023-08-01 ENCOUNTER — Encounter (HOSPITAL_BASED_OUTPATIENT_CLINIC_OR_DEPARTMENT_OTHER): Payer: Self-pay | Admitting: Pulmonary Disease

## 2023-08-01 ENCOUNTER — Other Ambulatory Visit (HOSPITAL_BASED_OUTPATIENT_CLINIC_OR_DEPARTMENT_OTHER): Payer: Self-pay

## 2023-08-01 ENCOUNTER — Other Ambulatory Visit: Payer: Self-pay

## 2023-08-01 ENCOUNTER — Other Ambulatory Visit (HOSPITAL_COMMUNITY): Payer: Self-pay

## 2023-08-01 ENCOUNTER — Ambulatory Visit (HOSPITAL_BASED_OUTPATIENT_CLINIC_OR_DEPARTMENT_OTHER): Payer: Medicare Other | Admitting: Pulmonary Disease

## 2023-08-01 VITALS — BP 126/68 | HR 88 | Ht 69.0 in | Wt 177.0 lb

## 2023-08-01 DIAGNOSIS — G8929 Other chronic pain: Secondary | ICD-10-CM

## 2023-08-01 DIAGNOSIS — J45901 Unspecified asthma with (acute) exacerbation: Secondary | ICD-10-CM

## 2023-08-01 DIAGNOSIS — T17908A Unspecified foreign body in respiratory tract, part unspecified causing other injury, initial encounter: Secondary | ICD-10-CM

## 2023-08-01 DIAGNOSIS — F17210 Nicotine dependence, cigarettes, uncomplicated: Secondary | ICD-10-CM

## 2023-08-01 DIAGNOSIS — J449 Chronic obstructive pulmonary disease, unspecified: Secondary | ICD-10-CM

## 2023-08-01 DIAGNOSIS — J4540 Moderate persistent asthma, uncomplicated: Secondary | ICD-10-CM

## 2023-08-01 MED ORDER — HYDROCODONE-ACETAMINOPHEN 5-325 MG PO TABS
ORAL_TABLET | ORAL | 0 refills | Status: DC
  Filled 2023-08-01: qty 60, 20d supply, fill #0

## 2023-08-01 MED ORDER — PREDNISONE 10 MG PO TABS
ORAL_TABLET | ORAL | 0 refills | Status: DC
  Filled 2023-08-01: qty 40, fill #0

## 2023-08-01 MED ORDER — BUDESONIDE-FORMOTEROL FUMARATE 160-4.5 MCG/ACT IN AERO
2.0000 | INHALATION_SPRAY | Freq: Two times a day (BID) | RESPIRATORY_TRACT | 6 refills | Status: DC
  Filled 2023-08-01: qty 10.2, 28d supply, fill #0
  Filled 2023-08-01 (×2): qty 10.2, 30d supply, fill #0
  Filled 2023-08-02: qty 10.2, 25d supply, fill #0

## 2023-08-01 MED ORDER — GUAIFENESIN-CODEINE 100-10 MG/5ML PO SOLN
5.0000 mL | Freq: Every day | ORAL | 0 refills | Status: DC | PRN
  Filled 2023-08-01: qty 120, 24d supply, fill #0

## 2023-08-01 MED ORDER — PREDNISONE 10 MG PO TABS
ORAL_TABLET | ORAL | 0 refills | Status: DC
Start: 2023-08-01 — End: 2023-08-30
  Filled 2023-08-01: qty 40, 16d supply, fill #0
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
## 2023-08-01 NOTE — Patient Instructions (Addendum)
 X Rx for symbicort  160 - 2 puffs twice daily, rinse mouth after use  X Prednisone  10 mg tabs Take 4 tabs  daily with food x 4 days, then 3 tabs daily x 4 days, then 2 tabs daily x 4 days, then 1 tab daily x4 days then stop. #40   OK to start on chantix  to help with smoking  X Codeine  cough syrup once daily prn   PFTs in 1 month

## 2023-08-02 ENCOUNTER — Other Ambulatory Visit (HOSPITAL_COMMUNITY): Payer: Self-pay

## 2023-08-09 ENCOUNTER — Encounter: Payer: Self-pay | Admitting: Surgery

## 2023-08-09 ENCOUNTER — Other Ambulatory Visit (HOSPITAL_COMMUNITY): Payer: Self-pay

## 2023-08-09 NOTE — Progress Notes (Signed)
I reviewed the MRI with one of our colorectal surgeons, Dr. Angelena Form.  We will arrange for an office visit with Dr. Cliffton Asters to discuss options for management with the patient.  Darnell Level, MD [facility] practice Office: [phone]

## 2023-08-20 ENCOUNTER — Other Ambulatory Visit (HOSPITAL_COMMUNITY): Payer: Self-pay

## 2023-08-22 ENCOUNTER — Other Ambulatory Visit (HOSPITAL_COMMUNITY): Payer: Self-pay

## 2023-08-22 MED ORDER — BENZONATATE 100 MG PO CAPS
ORAL_CAPSULE | ORAL | 3 refills | Status: DC
  Filled 2023-08-22: qty 90, 30d supply, fill #0

## 2023-08-26 ENCOUNTER — Other Ambulatory Visit (HOSPITAL_COMMUNITY): Payer: Self-pay

## 2023-08-30 ENCOUNTER — Encounter (HOSPITAL_BASED_OUTPATIENT_CLINIC_OR_DEPARTMENT_OTHER): Payer: Self-pay | Admitting: Pulmonary Disease

## 2023-08-30 ENCOUNTER — Other Ambulatory Visit (HOSPITAL_COMMUNITY): Payer: Self-pay

## 2023-08-30 MED ORDER — GUAIFENESIN-CODEINE 100-10 MG/5ML PO SOLN
5.0000 mL | Freq: Every day | ORAL | 0 refills | Status: DC | PRN
  Filled 2023-08-30: qty 120, 24d supply, fill #0

## 2023-08-30 MED ORDER — PREDNISONE 10 MG PO TABS
ORAL_TABLET | ORAL | 0 refills | Status: DC
  Filled 2023-08-30: qty 40, 16d supply, fill #0

## 2023-08-30 NOTE — Telephone Encounter (Signed)
Trelegy is okay. Refill sent on cough medication and prednisone

## 2023-09-01 ENCOUNTER — Other Ambulatory Visit: Payer: Self-pay

## 2023-09-01 ENCOUNTER — Other Ambulatory Visit (HOSPITAL_COMMUNITY): Payer: Self-pay

## 2023-09-01 MED ORDER — HYDROCODONE-ACETAMINOPHEN 5-325 MG PO TABS
1.0000 | ORAL_TABLET | Freq: Three times a day (TID) | ORAL | 0 refills | Status: DC | PRN
  Filled 2023-09-01: qty 60, 20d supply, fill #0

## 2023-09-02 ENCOUNTER — Other Ambulatory Visit: Payer: Self-pay

## 2023-09-02 ENCOUNTER — Other Ambulatory Visit (HOSPITAL_COMMUNITY): Payer: Self-pay

## 2023-09-03 ENCOUNTER — Other Ambulatory Visit (HOSPITAL_COMMUNITY): Payer: Self-pay

## 2023-09-13 ENCOUNTER — Other Ambulatory Visit (HOSPITAL_COMMUNITY): Payer: Self-pay

## 2023-09-27 ENCOUNTER — Other Ambulatory Visit (HOSPITAL_COMMUNITY): Payer: Self-pay

## 2023-09-30 ENCOUNTER — Other Ambulatory Visit (HOSPITAL_COMMUNITY): Payer: Self-pay

## 2023-09-30 MED ORDER — HYDROCODONE-ACETAMINOPHEN 5-325 MG PO TABS
1.0000 | ORAL_TABLET | Freq: Three times a day (TID) | ORAL | 0 refills | Status: DC | PRN
  Filled 2023-09-30: qty 60, 20d supply, fill #0

## 2023-10-10 ENCOUNTER — Other Ambulatory Visit (HOSPITAL_COMMUNITY): Payer: Self-pay

## 2023-10-14 ENCOUNTER — Other Ambulatory Visit (HOSPITAL_COMMUNITY): Payer: Self-pay

## 2023-10-14 ENCOUNTER — Other Ambulatory Visit: Payer: Self-pay

## 2023-10-17 ENCOUNTER — Other Ambulatory Visit: Payer: Self-pay

## 2023-10-18 ENCOUNTER — Other Ambulatory Visit (HOSPITAL_COMMUNITY): Payer: Self-pay

## 2023-10-19 ENCOUNTER — Other Ambulatory Visit (HOSPITAL_COMMUNITY): Payer: Self-pay

## 2023-10-20 ENCOUNTER — Other Ambulatory Visit: Payer: Self-pay

## 2023-10-20 ENCOUNTER — Other Ambulatory Visit (HOSPITAL_COMMUNITY): Payer: Self-pay

## 2023-10-20 MED ORDER — LANTUS SOLOSTAR 100 UNIT/ML ~~LOC~~ SOPN
25.0000 [IU] | PEN_INJECTOR | Freq: Every day | SUBCUTANEOUS | 3 refills | Status: DC
  Filled 2023-10-20: qty 24, 96d supply, fill #0
  Filled 2023-10-21: qty 30, 120d supply, fill #0

## 2023-10-20 MED ORDER — INSULIN DEGLUDEC FLEXTOUCH 100 UNIT/ML ~~LOC~~ SOPN
25.0000 [IU] | PEN_INJECTOR | Freq: Every day | SUBCUTANEOUS | 3 refills | Status: DC
Start: 2023-10-19 — End: 2024-04-18
  Filled 2023-10-20 – 2023-10-29 (×5): qty 24, 96d supply, fill #0
  Filled 2023-11-09: qty 24, 90d supply, fill #0
  Filled 2023-11-22 – 2024-03-31 (×2): qty 24, 96d supply, fill #0

## 2023-10-20 MED ORDER — LANTUS SOLOSTAR 100 UNIT/ML ~~LOC~~ SOPN
25.0000 [IU] | PEN_INJECTOR | Freq: Every day | SUBCUTANEOUS | 3 refills | Status: AC
  Filled 2023-10-20: qty 15, 50d supply, fill #0
  Filled 2023-10-22 – 2023-10-25 (×3): qty 45, 150d supply, fill #0
  Filled 2024-01-30: qty 30, 100d supply, fill #0
  Filled 2024-05-25: qty 30, 100d supply, fill #1

## 2023-10-21 ENCOUNTER — Other Ambulatory Visit (HOSPITAL_COMMUNITY): Payer: Self-pay

## 2023-10-22 ENCOUNTER — Other Ambulatory Visit (HOSPITAL_COMMUNITY): Payer: Self-pay

## 2023-10-24 ENCOUNTER — Other Ambulatory Visit (HOSPITAL_COMMUNITY): Payer: Self-pay

## 2023-10-25 ENCOUNTER — Other Ambulatory Visit (HOSPITAL_COMMUNITY): Payer: Self-pay

## 2023-10-26 ENCOUNTER — Other Ambulatory Visit (HOSPITAL_COMMUNITY): Payer: Self-pay

## 2023-10-26 ENCOUNTER — Ambulatory Visit (HOSPITAL_BASED_OUTPATIENT_CLINIC_OR_DEPARTMENT_OTHER): Payer: Self-pay | Admitting: Pulmonary Disease

## 2023-10-26 ENCOUNTER — Telehealth: Payer: Self-pay | Admitting: Pulmonary Disease

## 2023-10-26 MED ORDER — TRELEGY ELLIPTA 100-62.5-25 MCG/ACT IN AEPB
1.0000 | INHALATION_SPRAY | Freq: Every day | RESPIRATORY_TRACT | 5 refills | Status: DC
  Filled 2023-10-26 – 2023-11-01 (×3): qty 60, 30d supply, fill #0

## 2023-10-26 MED ORDER — PREDNISONE 10 MG PO TABS
ORAL_TABLET | ORAL | 0 refills | Status: AC
  Filled 2023-10-26: qty 40, 16d supply, fill #0

## 2023-10-26 NOTE — Telephone Encounter (Signed)
 Please see previous note.

## 2023-10-26 NOTE — Telephone Encounter (Signed)
 Triage nurse is calling stating this PT is feeling better today (See Triage encounter) he has no more pain and is breathing better. He would like a Pred taper called in if possible. Please call PT to advise. TY.

## 2023-10-26 NOTE — Telephone Encounter (Signed)
 Copied from CRM [id number]. Topic: Clinical - Red Word Triage >> Oct 26, 2023  8:22 AM Orinda Kenner C wrote: [facility] that prompted transfer to Nurse Triage: Patient states oxygen was 90 last night, and this morning 88-89, shortness of breath more than usually, wheezing, light headed, dizziness, coughing, and aspirate yesterday. Patient would like to seen, questioning going to ER. Patient is diabetic also. Please advise [phone].   Chief Complaint: Shortness of Breath Symptoms: wheezing, shortness of breath, coughing, back pain, chest pain, generalized malaise Frequency: several weeks Pertinent Negatives: Patient denies fever, coughing up blood, nausea, vomiting, diarrhea Disposition: [] ED /[] Urgent Care (no appt availability in office) / [] Appointment(In office/virtual)/ []  Early Virtual Care/ [] Home Care/ [x] Refused Recommended Disposition /[] [facility]/ []  Follow-up with PCP Additional Notes: Patient called and advised that He is having shortness of breath, wheezing, generalized malaise, back pain, chest pain, and coughing for a few weeks. He states that last night his Oxygen was around 90% and this morning it was 88-89%.  Oxygen was 92-93% during triage with this RN. Patient states he is slightly short of breath while at rest and it gets worse with exertion. Patient states that he recently gave up smoking about three weeks ago. He states that he feels terrible--light headed at times, dizzy at times. He states that he aspirated yesterday on a french fry and was able to cough that up last night. 2 years ago he had a cervical fusion that causes trouble with aspiration. Patient has a history of asthma.  He does say his chest feels uncomfortable when he takes a deep breath. Patient states that he has an appointment with a diabetic educator that he would like to keep at 2pm today 10/26/2023. Patient is advised that with his symptoms, it is recommended that he goes to the Emergency  Room. Patient had considered going to the Emergency Room last night and this morning but now he states that he feels better than he did.  He states that he does not want to go to the Emergency Room at this time. Patient is able to speak in full complete sentences at this time. Patient states that his wife who is a Nurse wanted him to ask about possibly a Prednisone taper for his symptoms. He is advised that if he gets worse at any point to go immediately to the Emergency Room and it is still advised that he goes at this time to be evaluated at the Emergency Room.  Reason for Disposition  [1] MODERATE difficulty breathing (e.g., speaks in phrases, SOB even at rest, pulse 100-120) AND [2] NEW-onset or WORSE than normal  Answer Assessment - Initial Assessment Questions E2C2 Pulmonary Triage - Initial Assessment Questions "Chief Complaint (e.g., cough, sob, wheezing, fever, chills, sweat or additional symptoms) *Go to specific symptom protocol after initial questions. Shortness of breath, wheezing, back pain, chest pain, coughing, generalized malaise  "How long have symptoms been present?" Several weeks  Have you tested for COVID or Flu? Note: If not, ask patient if a home test can be taken. If so, instruct patient to call back for positive results. No  MEDICINES:   "Have you used any OTC meds to help with symptoms?" Yes If yes, ask "What medications?" Generic Zyrtec  "Have you used your inhalers/maintenance medication?" Yes If yes, "What medications?" Albuterol Inhaler  If inhaler, ask "How many puffs and how often?" Note: Review instructions on medication in the chart. Patient used Albuterol inhaler 2 puffs yesterday and one puff  today  OXYGEN: "Do you wear supplemental oxygen?" No If yes, "How many liters are you supposed to use?" N/a  "Do you monitor your oxygen levels?" Yes If yes, "What is your reading (oxygen level) today?" 90% last night  "What is your usual oxygen  saturation reading?"  (Note: Pulmonary O2 sats should be 90% or greater) Around 95%    1. RESPIRATORY STATUS: "Describe your breathing?" (e.g., wheezing, shortness of breath, unable to speak, severe coughing)      Shortness of breath, wheezing, back pain, chest pain, coughing, generalized malaise 2. ONSET: "When did this breathing problem begin?"      A few weeks ago but worse lately 3. PATTERN "Does the difficult breathing come and go, or has it been constant since it started?"      constant 4. SEVERITY: "How bad is your breathing?" (e.g., mild, moderate, severe)    - MILD: No SOB at rest, mild SOB with walking, speaks normally in sentences, can lie down, no retractions, pulse < 100.    - MODERATE: SOB at rest, SOB with minimal exertion and prefers to sit, cannot lie down flat, speaks in phrases, mild retractions, audible wheezing, pulse 100-120.    - SEVERE: Very SOB at rest, speaks in single words, struggling to breathe, sitting hunched forward, retractions, pulse > 120      Moderate and worse on exertion 5. RECURRENT SYMPTOM: "Have you had difficulty breathing before?" If Yes, ask: "When was the last time?" and "What happened that time?"      Yes--longstanding problem per patient 6. CARDIAC HISTORY: "Do you have any history of heart disease?" (e.g., heart attack, angina, bypass surgery, angioplasty)      No 7. LUNG HISTORY: "Do you have any history of lung disease?"  (e.g., pulmonary embolus, asthma, emphysema)     Asthma 8. CAUSE: "What do you think is causing the breathing problem?"      Unsure 9. OTHER SYMPTOMS: "Do you have any other symptoms? (e.g., dizziness, runny nose, cough, chest pain, fever)     Shortness of breath, wheezing, back pain, chest pain, coughing, generalized malaise 10. O2 SATURATION MONITOR:  "Do you use an oxygen saturation monitor (pulse oximeter) at home?" If Yes, ask: "What is your reading (oxygen level) today?" "What is your usual oxygen saturation reading?"  (e.g., 95%)        12. TRAVEL: "Have you traveled out of the country in the last month?" (e.g., travel history, exposures)       No  Protocols used: Breathing Difficulty-A-AH

## 2023-10-26 NOTE — Telephone Encounter (Signed)
 Called CAL at [facility] at [facility] and advised them of patient refusing the ER at this time.

## 2023-10-26 NOTE — Addendum Note (Signed)
 Addended by: Luciano Cutter on: 10/26/2023 01:40 PM   Modules accepted: Orders

## 2023-10-26 NOTE — Telephone Encounter (Signed)
 OK for pred taper for his asthma. However if symptoms not improved in 48-72 hours on treatment he needs to either schedule appointment with Pulmonary or PCP or if significantly worsening respiratory symptoms, go to the ED.

## 2023-10-26 NOTE — Telephone Encounter (Signed)
 Pt.notified

## 2023-10-26 NOTE — Telephone Encounter (Signed)
 Dr. Everardo All, please advise. Dr. Vassie Loll is unavailable.

## 2023-10-27 ENCOUNTER — Other Ambulatory Visit (HOSPITAL_COMMUNITY): Payer: Self-pay

## 2023-10-28 ENCOUNTER — Other Ambulatory Visit (HOSPITAL_COMMUNITY): Payer: Self-pay

## 2023-10-28 ENCOUNTER — Other Ambulatory Visit: Payer: Self-pay

## 2023-10-29 ENCOUNTER — Other Ambulatory Visit (HOSPITAL_COMMUNITY): Payer: Self-pay

## 2023-10-31 ENCOUNTER — Other Ambulatory Visit (HOSPITAL_COMMUNITY): Payer: Self-pay

## 2023-10-31 ENCOUNTER — Other Ambulatory Visit: Payer: Self-pay

## 2023-10-31 MED ORDER — HYDROCODONE-ACETAMINOPHEN 5-325 MG PO TABS
ORAL_TABLET | ORAL | 0 refills | Status: DC
  Filled 2023-10-31: qty 60, 20d supply, fill #0

## 2023-11-01 ENCOUNTER — Other Ambulatory Visit (HOSPITAL_COMMUNITY): Payer: Self-pay

## 2023-11-03 NOTE — Telephone Encounter (Signed)
 NFN

## 2023-11-05 ENCOUNTER — Other Ambulatory Visit (HOSPITAL_COMMUNITY): Payer: Self-pay

## 2023-11-08 ENCOUNTER — Other Ambulatory Visit (HOSPITAL_COMMUNITY): Payer: Self-pay

## 2023-11-09 ENCOUNTER — Other Ambulatory Visit (HOSPITAL_COMMUNITY): Payer: Self-pay

## 2023-11-14 ENCOUNTER — Other Ambulatory Visit (HOSPITAL_COMMUNITY): Payer: Self-pay

## 2023-11-16 ENCOUNTER — Other Ambulatory Visit (HOSPITAL_COMMUNITY): Payer: Self-pay

## 2023-11-17 ENCOUNTER — Other Ambulatory Visit (HOSPITAL_COMMUNITY): Payer: Self-pay

## 2023-11-19 ENCOUNTER — Other Ambulatory Visit (HOSPITAL_COMMUNITY): Payer: Self-pay

## 2023-11-21 ENCOUNTER — Other Ambulatory Visit (HOSPITAL_COMMUNITY): Payer: Self-pay

## 2023-11-22 ENCOUNTER — Other Ambulatory Visit: Payer: Self-pay

## 2023-11-22 ENCOUNTER — Other Ambulatory Visit (HOSPITAL_COMMUNITY): Payer: Self-pay

## 2023-11-28 ENCOUNTER — Other Ambulatory Visit (HOSPITAL_COMMUNITY): Payer: Self-pay

## 2023-11-29 ENCOUNTER — Other Ambulatory Visit (HOSPITAL_COMMUNITY): Payer: Self-pay

## 2023-11-29 MED ORDER — HYDROCODONE-ACETAMINOPHEN 5-325 MG PO TABS
1.0000 | ORAL_TABLET | Freq: Three times a day (TID) | ORAL | 0 refills | Status: DC
  Filled 2023-11-29: qty 60, 20d supply, fill #0

## 2023-11-30 ENCOUNTER — Other Ambulatory Visit (HOSPITAL_COMMUNITY): Payer: Self-pay

## 2023-12-05 ENCOUNTER — Other Ambulatory Visit (HOSPITAL_COMMUNITY): Payer: Self-pay

## 2023-12-05 MED FILL — Albuterol Sulfate Inhal Aero 108 MCG/ACT (90MCG Base Equiv): RESPIRATORY_TRACT | 28 days supply | Qty: 6.7 | Fill #1 | Status: AC

## 2023-12-06 ENCOUNTER — Other Ambulatory Visit (HOSPITAL_COMMUNITY): Payer: Self-pay

## 2023-12-07 ENCOUNTER — Other Ambulatory Visit: Payer: Self-pay

## 2023-12-07 ENCOUNTER — Other Ambulatory Visit (HOSPITAL_COMMUNITY): Payer: Self-pay

## 2023-12-07 MED ORDER — INSULIN SYRINGE-NEEDLE U-100 31G X 5/16" 0.3 ML MISC
3 refills | Status: AC
  Filled 2023-12-07: qty 800, 90d supply, fill #0
  Filled 2023-12-08 – 2023-12-09 (×2): qty 100, 14d supply, fill #0
  Filled 2023-12-14: qty 100, 11d supply, fill #0
  Filled 2023-12-19: qty 100, 11d supply, fill #1
  Filled 2023-12-21 (×2): qty 700, 87d supply, fill #1
  Filled 2023-12-27 – 2024-01-09 (×2): qty 100, 11d supply, fill #1
  Filled 2024-01-23: qty 100, 11d supply, fill #2
  Filled 2024-01-26: qty 200, 25d supply, fill #2
  Filled 2024-02-11: qty 100, 11d supply, fill #2
  Filled 2024-02-20 – 2024-03-02 (×2): qty 100, 11d supply, fill #3
  Filled 2024-03-12: qty 100, 11d supply, fill #4

## 2023-12-08 ENCOUNTER — Other Ambulatory Visit (HOSPITAL_COMMUNITY): Payer: Self-pay

## 2023-12-09 ENCOUNTER — Other Ambulatory Visit (HOSPITAL_COMMUNITY): Payer: Self-pay

## 2023-12-14 ENCOUNTER — Other Ambulatory Visit (HOSPITAL_COMMUNITY): Payer: Self-pay

## 2023-12-19 ENCOUNTER — Other Ambulatory Visit (HOSPITAL_COMMUNITY): Payer: Self-pay

## 2023-12-20 ENCOUNTER — Other Ambulatory Visit (HOSPITAL_COMMUNITY): Payer: Self-pay

## 2023-12-21 ENCOUNTER — Other Ambulatory Visit (HOSPITAL_COMMUNITY): Payer: Self-pay

## 2023-12-21 ENCOUNTER — Other Ambulatory Visit: Payer: Self-pay

## 2023-12-22 ENCOUNTER — Other Ambulatory Visit (HOSPITAL_COMMUNITY): Payer: Self-pay

## 2023-12-22 ENCOUNTER — Other Ambulatory Visit: Payer: Self-pay

## 2023-12-23 ENCOUNTER — Other Ambulatory Visit: Payer: Self-pay

## 2023-12-26 ENCOUNTER — Other Ambulatory Visit (HOSPITAL_COMMUNITY): Payer: Self-pay

## 2023-12-28 ENCOUNTER — Other Ambulatory Visit (HOSPITAL_COMMUNITY): Payer: Self-pay

## 2023-12-28 ENCOUNTER — Other Ambulatory Visit: Payer: Self-pay

## 2023-12-30 ENCOUNTER — Other Ambulatory Visit (HOSPITAL_COMMUNITY): Payer: Self-pay

## 2023-12-30 ENCOUNTER — Encounter: Payer: Self-pay | Admitting: Student

## 2023-12-30 ENCOUNTER — Other Ambulatory Visit: Payer: Self-pay | Admitting: Student

## 2023-12-30 DIAGNOSIS — S12601D Unspecified nondisplaced fracture of seventh cervical vertebra, subsequent encounter for fracture with routine healing: Secondary | ICD-10-CM

## 2023-12-30 MED ORDER — HYDROCODONE-ACETAMINOPHEN 5-325 MG PO TABS
ORAL_TABLET | ORAL | 0 refills | Status: DC
  Filled 2023-12-30: qty 60, 20d supply, fill #0

## 2024-01-05 ENCOUNTER — Other Ambulatory Visit (HOSPITAL_COMMUNITY): Payer: Self-pay

## 2024-01-05 MED ORDER — TRELEGY ELLIPTA 100-62.5-25 MCG/ACT IN AEPB
1.0000 | INHALATION_SPRAY | Freq: Every day | RESPIRATORY_TRACT | 5 refills | Status: DC
  Filled 2024-01-05: qty 60, 30d supply, fill #0

## 2024-01-06 ENCOUNTER — Ambulatory Visit
Admission: RE | Admit: 2024-01-06 | Discharge: 2024-01-06 | Disposition: A | Discharge status: Home / Self Care | Source: Ambulatory Visit | Attending: Student | Admitting: Student

## 2024-01-06 DIAGNOSIS — S12601D Unspecified nondisplaced fracture of seventh cervical vertebra, subsequent encounter for fracture with routine healing: Secondary | ICD-10-CM

## 2024-01-07 ENCOUNTER — Other Ambulatory Visit (HOSPITAL_COMMUNITY): Payer: Self-pay

## 2024-01-09 ENCOUNTER — Other Ambulatory Visit: Payer: Self-pay

## 2024-01-09 ENCOUNTER — Other Ambulatory Visit (HOSPITAL_COMMUNITY): Payer: Self-pay

## 2024-01-16 ENCOUNTER — Other Ambulatory Visit (HOSPITAL_COMMUNITY): Payer: Self-pay

## 2024-01-17 ENCOUNTER — Other Ambulatory Visit (HOSPITAL_COMMUNITY): Payer: Self-pay

## 2024-01-19 ENCOUNTER — Other Ambulatory Visit (HOSPITAL_COMMUNITY): Payer: Self-pay

## 2024-01-23 ENCOUNTER — Other Ambulatory Visit (HOSPITAL_COMMUNITY): Payer: Self-pay

## 2024-01-25 ENCOUNTER — Other Ambulatory Visit (HOSPITAL_COMMUNITY): Payer: Self-pay

## 2024-01-25 ENCOUNTER — Encounter (HOSPITAL_COMMUNITY): Payer: Self-pay

## 2024-01-25 MED ORDER — FLUTICASONE-SALMETEROL 250-50 MCG/ACT IN AEPB
1.0000 | INHALATION_SPRAY | Freq: Two times a day (BID) | RESPIRATORY_TRACT | 3 refills | Status: DC
  Filled 2024-01-25 – 2024-02-08 (×3): qty 60, 30d supply, fill #0

## 2024-01-25 MED ORDER — AMOXICILLIN 500 MG PO CAPS
500.0000 mg | ORAL_CAPSULE | Freq: Three times a day (TID) | ORAL | 0 refills | Status: DC
  Filled 2024-01-25: qty 21, 7d supply, fill #0

## 2024-01-25 MED ORDER — AZITHROMYCIN 250 MG PO TABS
ORAL_TABLET | ORAL | 0 refills | Status: DC
  Filled 2024-01-25: qty 6, 5d supply, fill #0

## 2024-01-26 ENCOUNTER — Other Ambulatory Visit: Payer: Self-pay

## 2024-01-26 ENCOUNTER — Other Ambulatory Visit (HOSPITAL_COMMUNITY): Payer: Self-pay

## 2024-01-26 MED ORDER — CLINDAMYCIN HCL 150 MG PO CAPS
150.0000 mg | ORAL_CAPSULE | Freq: Four times a day (QID) | ORAL | 0 refills | Status: DC
  Filled 2024-01-26: qty 28, 7d supply, fill #0

## 2024-01-26 MED ORDER — HYDROCODONE-ACETAMINOPHEN 5-325 MG PO TABS
1.0000 | ORAL_TABLET | ORAL | 0 refills | Status: DC
  Filled 2024-01-26: qty 14, 3d supply, fill #0

## 2024-01-30 ENCOUNTER — Other Ambulatory Visit (HOSPITAL_COMMUNITY): Payer: Self-pay

## 2024-01-31 ENCOUNTER — Other Ambulatory Visit (HOSPITAL_COMMUNITY): Payer: Self-pay

## 2024-01-31 MED ORDER — HYDROCODONE-ACETAMINOPHEN 5-325 MG PO TABS
1.0000 | ORAL_TABLET | Freq: Three times a day (TID) | ORAL | 0 refills | Status: DC | PRN
  Filled 2024-01-31: qty 60, 20d supply, fill #0

## 2024-02-07 ENCOUNTER — Other Ambulatory Visit (HOSPITAL_COMMUNITY): Payer: Self-pay

## 2024-02-08 ENCOUNTER — Other Ambulatory Visit (HOSPITAL_COMMUNITY): Payer: Self-pay

## 2024-02-10 ENCOUNTER — Other Ambulatory Visit (HOSPITAL_COMMUNITY): Payer: Self-pay

## 2024-02-13 ENCOUNTER — Encounter (HOSPITAL_BASED_OUTPATIENT_CLINIC_OR_DEPARTMENT_OTHER): Payer: Self-pay | Admitting: Adult Health

## 2024-02-13 ENCOUNTER — Other Ambulatory Visit: Payer: Self-pay

## 2024-02-13 ENCOUNTER — Ambulatory Visit (HOSPITAL_BASED_OUTPATIENT_CLINIC_OR_DEPARTMENT_OTHER): Payer: Self-pay | Admitting: Pulmonary Disease

## 2024-02-13 ENCOUNTER — Telehealth: Payer: Self-pay

## 2024-02-13 ENCOUNTER — Ambulatory Visit (HOSPITAL_BASED_OUTPATIENT_CLINIC_OR_DEPARTMENT_OTHER)
Admission: RE | Admit: 2024-02-13 | Discharge: 2024-02-13 | Disposition: A | Discharge status: Home / Self Care | Source: Ambulatory Visit | Attending: Adult Health | Admitting: Adult Health

## 2024-02-13 ENCOUNTER — Encounter (HOSPITAL_BASED_OUTPATIENT_CLINIC_OR_DEPARTMENT_OTHER): Payer: Self-pay

## 2024-02-13 ENCOUNTER — Ambulatory Visit (HOSPITAL_BASED_OUTPATIENT_CLINIC_OR_DEPARTMENT_OTHER)

## 2024-02-13 ENCOUNTER — Ambulatory Visit (HOSPITAL_BASED_OUTPATIENT_CLINIC_OR_DEPARTMENT_OTHER): Admitting: Adult Health

## 2024-02-13 ENCOUNTER — Other Ambulatory Visit (HOSPITAL_COMMUNITY): Payer: Self-pay

## 2024-02-13 ENCOUNTER — Ambulatory Visit: Payer: Self-pay | Admitting: Adult Health

## 2024-02-13 ENCOUNTER — Other Ambulatory Visit (HOSPITAL_BASED_OUTPATIENT_CLINIC_OR_DEPARTMENT_OTHER): Payer: Self-pay

## 2024-02-13 VITALS — BP 122/73 | HR 75 | Ht 69.0 in | Wt 172.0 lb

## 2024-02-13 DIAGNOSIS — Z720 Tobacco use: Secondary | ICD-10-CM

## 2024-02-13 DIAGNOSIS — R918 Other nonspecific abnormal finding of lung field: Secondary | ICD-10-CM

## 2024-02-13 DIAGNOSIS — E1059 Type 1 diabetes mellitus with other circulatory complications: Secondary | ICD-10-CM | POA: Diagnosis not present

## 2024-02-13 DIAGNOSIS — J42 Unspecified chronic bronchitis: Secondary | ICD-10-CM | POA: Diagnosis not present

## 2024-02-13 DIAGNOSIS — R1310 Dysphagia, unspecified: Secondary | ICD-10-CM

## 2024-02-13 DIAGNOSIS — J45901 Unspecified asthma with (acute) exacerbation: Secondary | ICD-10-CM | POA: Diagnosis not present

## 2024-02-13 DIAGNOSIS — J9811 Atelectasis: Secondary | ICD-10-CM | POA: Diagnosis not present

## 2024-02-13 MED ORDER — TRELEGY ELLIPTA 200-62.5-25 MCG/ACT IN AEPB: 1.0000 | INHALATION_SPRAY | Freq: Every day | RESPIRATORY_TRACT | Status: DC

## 2024-02-13 MED ORDER — ALBUTEROL SULFATE (2.5 MG/3ML) 0.083% IN NEBU
3.0000 mL | INHALATION_SOLUTION | Freq: Four times a day (QID) | RESPIRATORY_TRACT | 3 refills | Status: DC | PRN
  Filled 2024-02-13: qty 180, 15d supply, fill #0

## 2024-02-13 MED ORDER — BUDESONIDE-FORMOTEROL FUMARATE 160-4.5 MCG/ACT IN AERO
2.0000 | INHALATION_SPRAY | Freq: Two times a day (BID) | RESPIRATORY_TRACT | 6 refills | Status: DC
  Filled 2024-02-13: qty 10.2, 30d supply, fill #0

## 2024-02-13 NOTE — Patient Instructions (Addendum)
 Chest xray today.  Trelegy 1 puff daily -finish sample as discussed, when done begin Symbicort  160 2 puffs Twice daily, rinse after use.  Doxycycline  100mg  Twice daily  for 7 days  Prednisone  20mg  daily for 5 days  Liquid Mucinex  DM Twice daily  As needed  cough /congestion  Flutter valve Three times a Rob[place]am   Albuterol  inhaler or albuterol  neb As needed   Follow up in 2 weeks prior to surgery for follow up  Please contact office for sooner follow up if symptoms do not improve or worsen or seek emergency care   Follow up with Dr. Jude  in 4 months with PFT and As needed

## 2024-02-13 NOTE — Telephone Encounter (Signed)
 FYI Only or Action Required?: FYI only for provider.  Patient is followed in Pulmonology for Asthma, last seen on 08/01/2023 by James Harden GAILS, MD.  Called Nurse Triage reporting Shortness of Breath.  Symptoms began several months ago.  Symptoms are: unchanged.  Triage Disposition: See HCP Within 4 Hours (Or PCP Triage)  Patient/caregiver understands and will follow disposition?: Yes    Copied from CRM [id number]. Topic: Clinical - Red Word Triage >> Feb 13, 2024  9:20 AM [name] wrote: Red Word that prompted transfer to Nurse Triage: Worsening shortness of breath and o2 below 90     Reason for Disposition  [1] MILD difficulty breathing (e.g., minimal/no SOB at rest, SOB with walking, pulse < 100) AND [2] NEW-onset or WORSE than normal  Answer Assessment - Initial Assessment Questions 1. RESPIRATORY STATUS: Describe your breathing? (e.g., wheezing, shortness of breath, unable to speak, severe coughing)      Shortness of breath  2. ONSET: When did this breathing problem begin?      Ongoing problem for years  3. PATTERN Does the difficult breathing come and go, or has it been constant since it started?      Intermittent  4. SEVERITY: How bad is your breathing? (e.g., mild, moderate, severe)      Mild to moderate  5. RECURRENT SYMPTOM: Have you had difficulty breathing before? If Yes, ask: When was the last time? and What happened that time?      Yes, history of asthma  6. CARDIAC HISTORY: Do you have any history of heart disease? (e.g., heart attack, angina, bypass surgery, angioplasty)      Yes 7. LUNG HISTORY: Do you have any history of lung disease?  (e.g., pulmonary embolus, asthma, emphysema)     Asthma  8. CAUSE: What do you think is causing the breathing problem?      History of asthma  9. OTHER SYMPTOMS: Do you have any other symptoms? (e.g., chest pain, cough, dizziness, fever, runny nose)     No 10. O2 SATURATION MONITOR:  Do you use an  oxygen  saturation monitor (pulse oximeter) at home? If Yes, ask: What is your reading (oxygen  level) today? What is your usual oxygen  saturation reading? (e.g., 95%)       90-91% in the morning but improves after getting up  Protocols used: Breathing Difficulty-A-AH

## 2024-02-13 NOTE — Telephone Encounter (Signed)
 Called patient and relayed cxr results per tammy

## 2024-02-13 NOTE — Progress Notes (Signed)
 @Patient  ID: James [name], male    DOB: [birth year], 68 y.o.   MRN: [id number]  Chief Complaint  Patient presents with   Abstract   Acute Visit    Referring provider: Nichole Senior, MD  HPI: 68 year old male, active smoker followed for COPD with chronic asthmatic bronchitis, bronchiectasis and recurrent aspiration pneumonia  Medical history significant for chronic pain syndrome, spinal stenosis, CAD, IDDM , Dysphagia   TEST/EVENTS :  moderate aspiration risk on a swallow study 01/2017   10/2019 admitted for asthma exacerbation, esophagram showed pharyngeal dysphagia with aspiration of barium outpatient swallow evaluation   LDCT 06/2023 >> RADS 2 LDCT 06/2020 >> 3 mm nodules   01/2023 Swallow eval >> pharyngeal dysphagia due to incomplete epiglottic inversion over the laryngeal vestibule as well as impaired timing of laryngeal vestibule closure ,Presence of ADCF hardware may contribute .A mendelsohn maneuver - in which pt elevates larynx and maintains it in that position to lengthen duration of LVC - led to decreased quantity of aspiration   LDCT chest 06/2022 RADS 2, 5 mm nodules   HRCT chest 11/2019 bilateral lower lobe bronchiectasis, no ILD, New airspace consolidation in the left lower lobe, Tracheobronchomalacia.     Ct chest 01/2014 - left volume loss and consolidation left lower lobe-rib fractures   Esophagram 10/31/19 >> Frank tracheal aspiration of thick barium during the pharyngeal phase of swallowing, causing cough response ,Widely patent distal esophageal mucosal ring   Spirometry 2012 >> FEV1 1.94 (52%) PFTs 05/2015 ratio 74, FEV1 72%, no BD response, mild restriction   PFTs 09/2018 -ratio 75, FEV1 70%, FVC 70%, no bronchodilator response  LDCT chest 06/2023 -stable nodules, no suspicious nodules   02/13/2024 Follow up: COPD  Discussed the use of AI scribe software for clinical note transcription with the patient, who gave verbal consent to proceed.  History of Present  Illness James [name] is a 68 year old male with chronic obstructive pulmonary disease (COPD) and chronic asthmatic bronchitis who presents with concerns about low oxygen  levels before upcoming surgery.  He has a history of COPD and chronic asthmatic bronchitis. H. He believes his lung function has degraded over the past five years. He quit smoking four months ago.   He reports lower oxygen  levels, especially in the mornings. He experiences coughing and phlegm production that improves as the Medici progresses. His oxygen  levels used to be 95-96% but are now around 90-91% on average, improving later in the [place]. He uses a pulse oximeter to monitor his levels. Walk test in the office today show 94-95% on room air.   He is currently using albuterol  as a rescue inhaler due to lack of prescription coverage for other inhalers. He has been recommended Breztri , Trelegy , Symbicort  and Advair  in the past . Unfortunately says they have not been affordable.  He has not checked the coverage for generic Symbicort .   He is scheduled for surgery on August 14th to remove a lipoma from his left groin area and is concerned about his low oxygen  levels in the morning affecting the surgery outcome.  He has a history of recurrent aspiration due to neck surgery, which affects his swallowing. He is addressing dental issues with an oral Careers adviser.  He has a history of recurrent pneumonia and asthmatic bronchitis and was recently treated with clindamycin  for discolored phlegm, which he feels was only partially effective. He has not been using any medication for cough and congestion but has a nebulizer and a flutter valve  at home. Feels he is not completely back to normal.       Allergies  Allergen Reactions   Augmentin  [Amoxicillin -Pot Clavulanate] Nausea And Vomiting and Other (See Comments)    Did it involve swelling of the face/tongue/throat, SOB, or low BP? No Did it involve sudden or severe rash/hives, skin peeling, or  any reaction on the inside of your mouth or nose? No Did you need to seek medical attention at a hospital or doctor's office? No When did it last happen?      5+ years If all above answers are "NO", may proceed with cephalosporin use.    Lexapro [Escitalopram Oxalate] Itching    Immunization History  Administered Date(s) Administered   Fluad Quad(high Dose 65+) 04/15/2022   Influenza Split 04/19/2011, 05/22/2012, 04/17/2013, 04/18/2017, 03/28/2019   Influenza Whole 07/31/2007, 04/15/2009, 05/08/2010   Influenza, Quadrivalent, Recombinant, Inj, Pf 03/22/2018, 04/28/2020, 03/25/2021   Influenza,inj,Quad PF,6+ Mos 03/19/2015, 05/19/2016   Influenza-Unspecified 03/19/2015, 04/12/2017   PFIZER(Purple Top)SARS-COV-2 Vaccination 09/21/2019, 10/12/2019, 04/13/2020, 10/24/2020, 03/25/2021   PNEUMOCOCCAL CONJUGATE-20 05/01/2023   Pfizer Covid-19 Vaccine Bivalent Booster 35yrs & up 04/09/2021, 12/28/2021   Pneumococcal Polysaccharide-23 04/19/2003, 09/24/2011, 04/11/2019   Respiratory Syncytial Virus Vaccine ,Recomb Aduvanted(Arexvy ) 06/04/2022   Td 12/22/2000   Tdap 09/24/2011, 08/19/2013   Zoster Recombinant(Shingrix) 09/27/2019, 12/20/2019   Zoster, Live 09/27/2019, 12/20/2019    Past Medical History:  Diagnosis Date   ANXIETY 04/03/2007   Anxiety    ASTHMA 09/06/2008   Asthma    ASTHMATIC BRONCHITIS, ACUTE 10/25/2008   Bladder neck obstruction    CARPAL TUNNEL SYNDROME, BILATERAL 07/31/2007   issues resolved, no surgery   Cervical disc disease    Chronic bronchitis (HCC)    get it about q yr (02/12/2014)   COPD (chronic obstructive pulmonary disease) (HCC)    CORONARY ARTERY DISEASE 04/03/2007   DEPRESSION 04/03/2007   Depression    DIABETES MELLITUS, TYPE I 04/03/2007   Diabetic retinopathy associated with diabetes mellitus due to underlying condition (HCC) 04/03/2007   DM W/EYE MANIFESTATIONS, TYPE I, UNCONTROLLED 04/04/2007   DM W/RENAL MNFST, TYPE I, UNCONTROLLED 04/04/2007   ED  (erectile dysfunction)    History of kidney stones    HYPERLIPIDEMIA 04/04/2007   Pneumonia    several times and again today (02/13/2104)   Renal insufficiency    Seizures (HCC)    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; none in the last couple years (02/12/2014)   Spinal stenosis     Tobacco History: Social History   Tobacco Use  Smoking Status Former   Current packs/[place]: 0.00   Average packs/[place]: 1 pack/[place] for 40.0 years (40.0 ttl pk-yrs)   Types: Cigarettes   Start date: 08/20/1979   Quit date: 08/20/2019   Years since quitting: 4.4   Passive exposure: Past  Smokeless Tobacco Never  Tobacco Comments   Successfully quit smoking 08/20/2019   Counseling given: Not Answered Tobacco comments: Successfully quit smoking 08/20/2019   Outpatient Medications Prior to Visit  Medication Sig Dispense Refill   acetaminophen  (TYLENOL ) 500 MG tablet Take 1,000 mg by mouth every 6 (six) hours as needed for mild pain or headache.     albuterol  (VENTOLIN  HFA) 108 (90 Base) MCG/ACT inhaler INHALE 2 PUFFS BY MOUTH INTO THE LUNGS EVERY 6 HOURS AS NEEDED FOR WHEEZING OR SHORTNESS OF BREATH. 6.7 g 2   amoxicillin  (AMOXIL ) 500 MG capsule Take 1 capsule (500 mg total) by mouth every 8 (eight) hours until gone. 21 capsule 0   aspirin   EC 81 MG tablet Take 1 tablet (81 mg total) by mouth daily. Swallow whole. (Patient taking differently: Take 81 mg by mouth at bedtime. Swallow whole.) 30 tablet 11   azithromycin  (ZITHROMAX  Z-PAK) 250 MG tablet Take 2 tablets by mouth on [place] 1.  Then take 1 tablet by mouth days 2-5. 6 tablet 0   benzonatate  (TESSALON ) 100 MG capsule Take 1 capsule by mouth 3 times daily as needed 90 capsule 3   cetirizine (ZYRTEC) 10 MG tablet Take 10 mg by mouth at bedtime.     clindamycin  (CLEOCIN ) 150 MG capsule Take 1 capsule (150 mg total) by mouth 4 (four) times daily until complete 28 capsule 0   Continuous Blood Gluc Sensor (FREESTYLE LIBRE 3 SENSOR) MISC Change sensor every 14 days to  monitor blood glucose continously 2 each 6   Continuous Glucose Sensor (FREESTYLE LIBRE 3 PLUS SENSOR) MISC Change every 15 days to monitor blood glucose continously 2 each 11   cyclobenzaprine  (FLEXERIL ) 5 MG tablet Take 1 tablet (5 mg total) by mouth 2 (two) times daily. 60 tablet 5   finasteride  (PROSCAR ) 5 MG tablet Take 1 tablet (5 mg total) by mouth daily. 90 tablet 3   fluticasone  (FLONASE ) 50 MCG/ACT nasal spray Place 2 sprays into both nostrils daily. (Patient taking differently: Place 2 sprays into both nostrils daily as needed for allergies.) 48 g 3   Glucagon  (GVOKE HYPOPEN  2-PACK) 1 MG/0.2ML SOAJ Inject under the skin as directed for severe hypoglycemia. 0.4 mL 5   Glucagon  (GVOKE HYPOPEN  2-PACK) 1 MG/0.2ML SOAJ Use as directed under the skin for severe hypoglycemia. 0.4 mL 5   glucose blood (CONTOUR NEXT TEST) test strip USE 8 TIMES DAILY AS DIRECTED TO MONITOR BLOOD GLUCOSE 600 strip 4   guaiFENesin -codeine  100-10 MG/5ML syrup Take 5 mLs by mouth daily as needed for cough. 120 mL 0   HYDROcodone -acetaminophen  (NORCO/VICODIN) 5-325 MG tablet Take 1 tablet by mouth 3 (three) times daily as needed. 60 tablet 0   HYDROcodone -acetaminophen  (NORCO/VICODIN) 5-325 MG tablet Take 1 tablet by mouth every 4 to 6 hours as needed for pain 14 tablet 0   HYDROcodone -acetaminophen  (NORCO/VICODIN) 5-325 MG tablet Take 1 tablet by mouth 3 (three) times daily as needed. 60 tablet 0   ibuprofen  (ADVIL ) 200 MG tablet Take 800 mg by mouth every 8 (eight) hours as needed (pain.).     insulin  aspart (FIASP  FLEXTOUCH) 100 UNIT/ML FlexTouch Pen INJECT UP TO 100 UNITS A [place] (ICR 20, ISF 20, target 120) 90 mL 3   insulin  degludec (TRESIBA  FLEXTOUCH) 100 UNIT/ML FlexTouch Pen Inject 40 Units into the skin daily. (Patient taking differently: Inject 25 Units into the skin in the morning.) 12 mL 11   Insulin  Degludec FlexTouch 100 UNIT/ML SOPN Inject 25 Units into the skin daily. 24 mL 3   insulin  glargine (LANTUS   SOLOSTAR) 100 UNIT/ML Solostar Pen Inject 25-30 Units into the skin daily as directed 45 mL 3   Insulin  Pen Needle (TECHLITE PEN NEEDLES) 31G X 8 MM MISC Use 1 pen needle 4-5 times daily as directed. 500 each 3   Insulin  Pen Needle 31G X 8 MM MISC Use 1 pen needle subcutaneously 4 - 5 times daily as directed. 500 each 3   Insulin  Syringe-Needle U-100 31G X 5/16 0.3 ML MISC use to inject insulin  6 to 8 times daily 750 each 3   levothyroxine  (SYNTHROID ) 100 MCG tablet Take 1 tablet (100 mcg total) by mouth daily. 90 tablet 4  metoCLOPramide  (REGLAN ) 5 MG tablet Take 1 tablet (5 mg total) by mouth 2 (two) times daily before a meal. 180 tablet 4   pantoprazole  (PROTONIX ) 40 MG tablet Take 1 tablet by mouth daily. 90 tablet 12   promethazine  (PHENERGAN ) 25 MG tablet Take 1 tablet (25 mg total) by mouth 3 (three) times daily as needed. 90 tablet 1   simvastatin  (ZOCOR ) 40 MG tablet Take 1 tablet (40 mg total) by mouth daily. 90 tablet 3   tamsulosin  (FLOMAX ) 0.4 MG CAPS capsule Take 1 capsule (0.4 mg total) by mouth daily. 90 capsule 4   traZODone  (DESYREL ) 150 MG tablet Take 1 tablet (150 mg total) by mouth at bedtime as needed for sleep 90 tablet 4   albuterol  (PROVENTIL ) (2.5 MG/3ML) 0.083% nebulizer solution Take 3 mLs by nebulization every 6 (six) hours as needed for wheezing or shortness of breath. 180 mL 3   budesonide -formoterol  (SYMBICORT ) 160-4.5 MCG/ACT inhaler Inhale 2 puffs into the lungs 2 (two) times daily. 10.2 g 6   fluticasone -salmeterol (ADVAIR ) 250-50 MCG/ACT AEPB Inhale 1 puff into the lungs 2 (two) times daily. 60 each 3   Fluticasone -Umeclidin-Vilant (TRELEGY ELLIPTA ) 100-62.5-25 MCG/ACT AEPB Inhale 1 puff into the lungs daily. 60 each 5   Fluticasone -Umeclidin-Vilant (TRELEGY ELLIPTA ) 100-62.5-25 MCG/ACT AEPB Inhale 1 puff into the lungs daily. 60 each 5   No facility-administered medications prior to visit.     Review of Systems:   Constitutional:   No  weight loss, night  sweats,  Fevers, chills, +fatigue, or  lassitude.  HEENT:   No headaches,  Difficulty swallowing,  Tooth/dental problems, or  Sore throat,                No sneezing, itching, ear ache, nasal congestion, post nasal drip,   CV:  No chest pain,  Orthopnea, PND, swelling in lower extremities, anasarca, dizziness, palpitations, syncope.   GI  No heartburn, indigestion, abdominal pain, nausea, vomiting, diarrhea, change in bowel habits, loss of appetite, bloody stools.   Resp: No shortness of breath with exertion or at rest.  No excess mucus, no productive cough,  No non-productive cough,  No coughing up of blood.  No change in color of mucus.  No wheezing.  No chest wall deformity  Skin: no rash or lesions.  GU: no dysuria, change in color of urine, no urgency or frequency.  No flank pain, no hematuria   MS:  No joint pain or swelling.  No decreased range of motion.  No back pain.    Physical Exam  BP 122/73 (BP Location: Left Arm, Patient Position: Sitting, Cuff Size: Normal)   Pulse 75   Ht 5' 9 (1.753 m)   Wt 172 lb (78 kg)   SpO2 94%   BMI 25.40 kg/m   GEN: A/Ox3; pleasant , NAD, well nourished    HEENT:  [place]/AT,    NOSE-clear, THROAT-clear, no lesions, no postnasal drip or exudate noted. Poor dentition   NECK:  Supple w/ fair ROM; no JVD; normal carotid impulses w/o bruits; no thyromegaly or nodules palpated; no lymphadenopathy.    RESP  Coarse rhonchi bilaterally No accessory muscle use, no dullness to percussion  CARD:  RRR, no m/r/g, no peripheral edema, pulses intact, no cyanosis or clubbing.  GI:   Soft & nt; nml bowel sounds; no organomegaly or masses detected.   Musco: Warm bil, no deformities or joint swelling noted.   Neuro: alert, no focal deficits noted.    Skin: Warm,  no lesions or rashes    Lab Results:     Imaging: DG Chest 2 View Result Date: 02/13/2024 CLINICAL DATA:  Cough. EXAM: CHEST - 2 VIEW COMPARISON:  CT chest dated 07/18/2023. Chest  radiographs dated 04/29/2023. FINDINGS: The heart size and mediastinal contours are within normal limits. Mild bibasilar linear atelectasis/scarring. No focal consolidation, pleural effusion, or pneumothorax. Remote healed right-sided rib fractures. Partially visualized ACDF. No acute osseous abnormality. IMPRESSION: Mild bibasilar linear atelectasis/scarring. Otherwise, no acute cardiopulmonary findings. Electronically Signed   By: Harrietta Sherry M.D.   On: 02/13/2024 15:43    Administration History     None          Latest Ref Rng & Units 09/18/2018   10:21 AM 05/22/2015   10:01 AM  PFT Results  FVC-Pre L 3.17  3.41   FVC-Predicted Pre % 70  74   FVC-Post L 3.35  3.28   FVC-Predicted Post % 74  71   Pre FEV1/FVC % % 75  74   Post FEV1/FCV % % 76  77   FEV1-Pre L 2.39  2.52   FEV1-Predicted Pre % 70  72   FEV1-Post L 2.56  2.53   DLCO uncorrected ml/min/mmHg  22.74   DLCO UNC% %  73   DLVA Predicted %  97   TLC L  5.50   TLC % Predicted %  80   RV % Predicted %  113     Lab Results  Component Value Date   NITRICOXIDE 11 01/26/2017        Assessment & Plan:    Assessment and Plan Assessment & Plan Chronic obstructive pulmonary disease (COPD) /Chronic asthmatic bronchitis  -slow to resolve exacerbation.   Check PFT on return OV.  Samples of Trelegy given , finish then start Symbicort  inhaler, 2 puffs twice daily,. Refill albuterol  nebulizer solution. Perform a chest x-ray to rule out pneumonia. Prescribe doxycycline  100 mg twice daily for 7 days and prednisone  20 mg daily for 5 days. Advise use of a flutter valve and nebulizer twice daily for the next few days. Liquid Mucinex  DM for cough and congestion.  Recurrent aspiration pneumonia -check chest xray  Aspiration precautions discussed.  Discuss dental health as a potential source of recurrent infection. Advise smaller, more frequent meals, no eating late at night, staying upright after meals, and using the chin tuck  technique while eating. Caution with certain foods, avoid talking while eating, and advise swallowing twice.  Diabetes mellitus   Steroid education given. Patient with low BS at  check out . Dexcom with BS at 55-65. Patient skipped lunch, advised to eat regulary with no skipped meals. Patient finger stick in office 119 after drinking cola. Continue to follow with endocriniolgy.  Close eye on blood sugars while taking steroids . Call if >250.   Tobacco abuse,-smoking cessation discussed.   I spent   44 minutes dedicated to the care of this patient on the date of this encounter to include pre-visit review of records, face-to-face time with the patient discussing conditions above, post visit ordering of testing, clinical documentation with the electronic health record, making appropriate referrals as documented, and communicating necessary findings to members of the patients care team.   Madelin Stank, NP 02/13/2024

## 2024-02-14 ENCOUNTER — Telehealth (HOSPITAL_BASED_OUTPATIENT_CLINIC_OR_DEPARTMENT_OTHER): Payer: Self-pay

## 2024-02-14 NOTE — Telephone Encounter (Signed)
 Copied from CRM [id number]. Topic: Clinical - Prescription Issue >> Feb 13, 2024  9:24 AM [name] wrote: Reason for CRM: Patient is seeking guidance on how to apply for  a manufacturers discount on his Fluticasone -Umeclidin-Vilant (TRELEGY ELLIPTA ) 100-62.5-25 MCG/ACT AEPB - Patient states this wold cost him $300 a month without a discount.

## 2024-02-15 ENCOUNTER — Other Ambulatory Visit (HOSPITAL_COMMUNITY): Payer: Self-pay

## 2024-02-15 ENCOUNTER — Telehealth: Payer: Self-pay | Admitting: *Deleted

## 2024-02-15 MED ORDER — DOXYCYCLINE HYCLATE 100 MG PO TABS
100.0000 mg | ORAL_TABLET | Freq: Two times a day (BID) | ORAL | 0 refills | Status: DC
Start: 2024-02-15 — End: 2024-02-23
  Filled 2024-02-15: qty 14, 7d supply, fill #0

## 2024-02-15 MED ORDER — PREDNISONE 20 MG PO TABS
20.0000 mg | ORAL_TABLET | Freq: Every day | ORAL | 0 refills | Status: DC
  Filled 2024-02-15: qty 5, 5d supply, fill #0

## 2024-02-15 NOTE — Telephone Encounter (Signed)
 Copied from CRM [id number]. Topic: Clinical - Prescription Issue >> Feb 15, 2024 11:40 AM [name] wrote: Reason for CRM: Patient calling to inform Tammy Parrett that the Doxycycline  and prednisone  has not yet been sent to his pharmacy . Please send the prescriptions to: [facility] [address] [place] [address] Phone: [phone] Fax: [phone]   Per last ov 02/13/24:  Chest xray today.  Trelegy 1 puff daily -finish sample as discussed, when done begin Symbicort  160 2 puffs Twice daily, rinse after use.  Doxycycline  100mg  Twice daily  for 7 days  Prednisone  20mg  daily for 5 days  Liquid Mucinex  DM Twice daily  As needed  cough /congestion  Flutter valve Three times a Sch[place]   Albuterol  inhaler or albuterol  neb As needed   Follow up in 2 weeks prior to surgery for follow up  Please contact office for sooner follow up if symptoms do not improve or worsen or seek emergency care   I have sent rxs to his preferred pharm and notified pt this was done via mychart

## 2024-02-16 NOTE — Progress Notes (Signed)
 Surgery orders requested via Epic inbox.

## 2024-02-17 ENCOUNTER — Ambulatory Visit: Payer: Self-pay | Admitting: Surgery

## 2024-02-17 DIAGNOSIS — Z01818 Encounter for other preprocedural examination: Secondary | ICD-10-CM

## 2024-02-18 ENCOUNTER — Other Ambulatory Visit (HOSPITAL_COMMUNITY): Payer: Self-pay

## 2024-02-19 NOTE — Progress Notes (Unsigned)
 Subjective:    Patient ID: James [name], male    DOB: 1955-10-23,     MRN: [id number]    Brief patient profile:  59 yowm quit smoking 03/2015 with nl pfts 05/22/2015 off all rx  C/w AB / not copd   TESTS: Spirometry 2012 >> FEV1 1.94 (52%)  PMhx >> HLD, Anxiety/depression, Spinal stenosis, CAD, DM type I with retinopathy, CKD  04/11/2015 Follow up : COPD  Pt returns for 1 week follow up . Seen last ov with AECOPD . Tx w/  pred taper.  CXR showed COPD changes with no sign of PNA.  He is starting to feel better but still has ongoing cough .   Still smoking but has started chantix , discussed smoking cessation.  He denies fever, discolored mucus , chest pain, orthopnea or edema.  Pt has DM type 1 . Has had a couple courses of prednisone   rec Continue on Symbicort  2 puffs Twice daily  , rinse after use.  Keep working on the not smoking    05/22/2015  f/u ov/James [name] re:  AB/ resolved off rx with nl pfs Chief Complaint  Patient presents with   Follow-up    PFT done today. Cough has improved and he is breathing some better. Stopped smoking approx 2 months ago.    Not limited by breathing from desired activities   Rec     02/23/2024  ACUTE James [name] pt / ov/James [name] re: ***   maint on ***  No chief complaint on file.   Dyspnea:  *** Cough: *** Sleeping: *** resp cc  SABA use: *** 02: ***  Lung cancer screening :  ***    No obvious Alms to James [name] or daytime variability or assoc excess/ purulent sputum or mucus plugs or hemoptysis or cp or chest tightness, subjective wheeze or overt sinus or hb symptoms.    Also denies any obvious fluctuation of symptoms with weather or environmental changes or other aggravating or alleviating factors except as outlined above   No unusual exposure hx or h/o childhood pna/ asthma or knowledge of premature birth.  Current Allergies, Complete Past Medical History, Past Surgical History, Family History, and Social History were reviewed in James [name] record.  ROS  The following are not active complaints unless bolded Hoarseness, sore throat, dysphagia, dental problems, itching, sneezing,  nasal congestion or discharge of excess mucus or purulent secretions, ear ache,   fever, chills, sweats, unintended wt loss or wt gain, classically pleuritic or exertional cp,  orthopnea pnd or arm/hand swelling  or leg swelling, presyncope, palpitations, abdominal pain, anorexia, nausea, vomiting, diarrhea  or change in bowel habits or change in bladder habits, change in stools or change in urine, dysuria, hematuria,  rash, arthralgias, visual complaints, headache, numbness, weakness or ataxia or problems with walking or coordination,  change in mood or  memory.        No outpatient medications have been marked as taking for the 02/23/24 encounter (Appointment) with James Ozell NOVAK, MD.                Objective:   Physical Exam    02/23/2024          ***  05/22/2015        163      04/11/15 155 lb 3.2 oz (70.398 kg)  04/04/15 154 lb (69.854 kg)  03/28/15 157 lb 12.8 oz (71.578 kg)            Assessment &  Plan:

## 2024-02-20 ENCOUNTER — Other Ambulatory Visit (HOSPITAL_COMMUNITY): Payer: Self-pay

## 2024-02-20 MED ORDER — TRAZODONE HCL 150 MG PO TABS
150.0000 mg | ORAL_TABLET | Freq: Every day | ORAL | 4 refills | Status: AC
  Filled 2024-02-20: qty 90, 90d supply, fill #0
  Filled 2024-06-01 – 2024-06-30 (×2): qty 90, 90d supply, fill #1

## 2024-02-21 ENCOUNTER — Other Ambulatory Visit (HOSPITAL_COMMUNITY): Payer: Self-pay

## 2024-02-21 MED ORDER — PANTOPRAZOLE SODIUM 40 MG PO TBEC
40.0000 mg | DELAYED_RELEASE_TABLET | Freq: Every day | ORAL | 3 refills | Status: AC
  Filled 2024-02-21: qty 90, 90d supply, fill #0
  Filled 2024-06-30: qty 90, 90d supply, fill #1

## 2024-02-21 NOTE — Patient Instructions (Addendum)
 SURGICAL WAITING ROOM VISITATION Patients having surgery or a procedure may have no more than 2 support people in the waiting area - these visitors may rotate.    Children under the age of 61 must have an adult with them who is not the patient.  If the patient needs to stay at the hospital during part of their recovery, the visitor guidelines for inpatient rooms apply. Pre-op nurse will coordinate an appropriate time for 1 support person to accompany patient in pre-op.  This support person may not rotate.    Please refer to the [facility] website for the visitor guidelines for Inpatients (after your surgery is over and you are in a regular room).       Your procedure is scheduled on: 03-01-24   Report to [facility] Main Entrance    Report to admitting at 5:15 AM   Call this number if you have problems the morning of surgery [phone]   Do not eat food or drink liquids :After Midnight.           If you have questions, please contact your surgeon's office.   FOLLOW BOWEL PREP AND ANY ADDITIONAL PRE OP INSTRUCTIONS YOU RECEIVED FROM YOUR SURGEON'S OFFICE!!!     Oral Hygiene is also important to reduce your risk of infection.                                    Remember - BRUSH YOUR TEETH THE MORNING OF SURGERY WITH YOUR REGULAR TOOTHPASTE   Do NOT smoke after Midnight   Take these medicines the morning of surgery with A SIP OF WATER:    Okay to use inhalers, nasal spray   If needed Tylenol , Hydrocodone , Promethazine   Stop all vitamins and herbal supplements 7 days before surgery  How to Manage Your Diabetes Before and After Surgery  Why is it important to control my blood sugar before and after surgery? Improving blood sugar levels before and after surgery helps healing and can limit problems. A way of improving blood sugar control is eating a healthy diet by:  Eating less sugar and carbohydrates  Increasing activity/exercise  Talking with your doctor about  reaching your blood sugar goals High blood sugars (greater than 180 mg/dL) can raise your risk of infections and slow your recovery, so you will need to focus on controlling your diabetes during the weeks before surgery. Make sure that the doctor who takes care of your diabetes knows about your planned surgery including the date and location.  How do I manage my blood sugar before surgery? Check your blood sugar at least 4 times a [place], starting 2 days before surgery, to make sure that the level is not too high or low. Check your blood sugar the morning of your surgery when you wake up and every 2 hours until you get to the Short Stay unit. If your blood sugar is less than 70 mg/dL, you will need to treat for low blood sugar: Do not take insulin . Treat a low blood sugar (less than 70 mg/dL) with  cup of clear juice (cranberry or apple), 4 glucose tablets, OR glucose gel. Recheck blood sugar in 15 minutes after treatment (to make sure it is greater than 70 mg/dL). If your blood sugar is not greater than 70 mg/dL on recheck, call [phone] for further instructions. Report your blood sugar to the short stay nurse when you get  to Short Stay.  If you are admitted to the hospital after surgery: Your blood sugar will be checked by the staff and you will probably be given insulin  after surgery (instead of oral diabetes medicines) to make sure you have good blood sugar levels. The goal for blood sugar control after surgery is 80-180 mg/dL.   WHAT DO I DO ABOUT MY DIABETES MEDICATION?  Do not take oral diabetes medicines (pills) the morning of surgery.  THE MORNING OF SURGERY, take 1/2 of Insulin  Glargine (Lantus ).      THE MORNING OF SURGERY, tIf your CBG is greater than 220 mg/dL, you may take  of your sliding scale  (correction) dose of insulin ..  DO NOT TAKE THE FOLLOWING 7 DAYS PRIOR TO SURGERY: Ozempic, Wegovy, Rybelsus (Semaglutide), Byetta (exenatide), Bydureon (exenatide ER), Victoza,  Saxenda (liraglutide), or Trulicity (dulaglutide) Mounjaro (Tirzepatide) Adlyxin (Lixisenatide), Polyethylene Glycol Loxenatide.   Reviewed and Endorsed by [facility] Patient Education Committee, August 2015                              You may not have any metal on your body including  jewelry, and body piercing             Do not wear lotions, powders, cologne, or deodorant              Men may shave face and neck.   Do not bring valuables to the hospital. [facility] IS NOT RESPONSIBLE   FOR VALUABLES.   Contacts, dentures or bridgework may not be worn into surgery.  DO NOT BRING YOUR HOME MEDICATIONS TO THE HOSPITAL. PHARMACY WILL DISPENSE MEDICATIONS LISTED ON YOUR MEDICATION LIST TO YOU DURING YOUR ADMISSION IN THE HOSPITAL!    Patients discharged on the [place] of surgery will not be allowed to drive home.  Someone NEEDS to stay with you for the first 24 hours after anesthesia.   Special Instructions: Bring a copy of your healthcare power of attorney and living will documents the [place] of surgery if you haven't scanned them before.              Please read over the following fact sheets you were given: IF YOU HAVE QUESTIONS ABOUT YOUR PRE-OP INSTRUCTIONS PLEASE CALL [phone] [place]  If you received a COVID test during your pre-op visit  it is requested that you wear a mask when out in public, stay away from anyone that may not be feeling well and notify your surgeon if you develop symptoms. If you test positive for Covid or have been in contact with anyone that has tested positive in the last 10 days please notify you surgeon.  [facility] - Preparing for Surgery Before surgery, you can play an important role.  Because skin is not sterile, your skin needs to be as free of germs as possible.  You can reduce the number of germs on your skin by washing with CHG (chlorahexidine gluconate) soap before surgery.  CHG is an antiseptic cleaner which kills germs and bonds with the skin to  continue killing germs even after washing. Please DO NOT use if you have an allergy to CHG or antibacterial soaps.  If your skin becomes reddened/irritated stop using the CHG and inform your nurse when you arrive at Short Stay. Do not shave (including legs and underarms) for at least 48 hours prior to the first CHG shower.  You may shave your face/neck.  Please follow these instructions carefully:  1.  Shower with CHG Soap the night before surgery and the  morning of surgery.  2.  If you choose to wash your hair, wash your hair first as usual with your normal  shampoo.  3.  After you shampoo, rinse your hair and body thoroughly to remove the shampoo.                             4.  Use CHG as you would any other liquid soap.  You can apply chg directly to the skin and wash.  Gently with a scrungie or clean washcloth.  5.  Apply the CHG Soap to your body ONLY FROM THE NECK DOWN.   Do   not use on face/ open                           Wound or open sores. Avoid contact with eyes, ears mouth and   genitals (private parts).                       Wash face,  Genitals (private parts) with your normal soap.             6.  Wash thoroughly, paying special attention to the area where your    surgery  will be performed.  7.  Thoroughly rinse your body with warm water from the neck down.  8.  DO NOT shower/wash with your normal soap after using and rinsing off the CHG Soap.                9.  Pat yourself dry with a clean towel.            10.  Wear clean pajamas.            11.  Place clean sheets on your bed the night of your first shower and do not  sleep with pets. [place] of Surgery : Do not apply any lotions/deodorants the morning of surgery.  Please wear clean clothes to the hospital/surgery center.  FAILURE TO FOLLOW THESE INSTRUCTIONS MAY RESULT IN THE CANCELLATION OF YOUR SURGERY  PATIENT SIGNATURE_________________________________  NURSE  SIGNATURE__________________________________  ________________________________________________________________________

## 2024-02-21 NOTE — Progress Notes (Signed)
  Date of COVID positive in last 90 days:  No  PCP - Garnette Ore, MD Cardiologist - Madonna Large, DO Pulmonologist - Harden Staff, MD  Chest x-ray - 02-13-24 Epic EKG - 05-02-23 Epic Stress Test - Yes, several years ago ECHO - 03-09-22 Epic Cardiac Cath - 04-29-20 Epic Pacemaker/ICD device last checked:  N/A Spinal Cord Stimulator:N/A  Bowel Prep - N/A  Sleep Study -  N/A CPAP -   Freestyle Libre R arm Fasting Blood Sugar - 170 or lower Checks Blood Sugar - multiple times a [place]pton  Last dose of GLP1 agonist-  N/A GLP1 instructions:  Do not take after     Last dose of SGLT-2 inhibitors-  N/A SGLT-2 instructions:  Do not take after    Blood Thinner Instructions:N/A Aspirin  Instructions: Last Dose:  Activity level:  Can go up a flight of stairs and perform activities of daily living without stopping and without symptoms of chest pain or shortness of breath.  Some shortness of breath with exertion at times where he has to sit and rest.  This has been an ongoing issue for a year.  Has not had to use rescue inhaler recently.  Anesthesia review:  CAD, carotid artery stenosis, COPD,  DM, SOB  Patient denies shortness of breath, fever, cough and chest pain at PAT appointment  Patient verbalized understanding of instructions that were given to them at the PAT appointment. Patient was also instructed that they will need to review over the PAT instructions again at home before surgery.

## 2024-02-23 ENCOUNTER — Other Ambulatory Visit: Payer: Self-pay

## 2024-02-23 ENCOUNTER — Encounter (HOSPITAL_COMMUNITY): Payer: Self-pay

## 2024-02-23 ENCOUNTER — Encounter (HOSPITAL_COMMUNITY)
Admission: RE | Admit: 2024-02-23 | Discharge: 2024-02-23 | Disposition: A | Discharge status: Home / Self Care | Source: Ambulatory Visit | Attending: Surgery | Admitting: Surgery

## 2024-02-23 ENCOUNTER — Encounter: Payer: Self-pay | Admitting: Internal Medicine

## 2024-02-23 ENCOUNTER — Ambulatory Visit (INDEPENDENT_AMBULATORY_CARE_PROVIDER_SITE_OTHER): Admitting: Internal Medicine

## 2024-02-23 VITALS — BP 126/74 | HR 71 | Temp 98.3°F | Ht 69.0 in | Wt 173.6 lb

## 2024-02-23 VITALS — BP 122/73 | HR 67 | Temp 97.8°F | Resp 16 | Ht 69.0 in | Wt 172.0 lb

## 2024-02-23 DIAGNOSIS — Z87891 Personal history of nicotine dependence: Secondary | ICD-10-CM

## 2024-02-23 DIAGNOSIS — E109 Type 1 diabetes mellitus without complications: Secondary | ICD-10-CM | POA: Insufficient documentation

## 2024-02-23 DIAGNOSIS — I2510 Atherosclerotic heart disease of native coronary artery without angina pectoris: Secondary | ICD-10-CM | POA: Insufficient documentation

## 2024-02-23 DIAGNOSIS — E119 Type 2 diabetes mellitus without complications: Secondary | ICD-10-CM

## 2024-02-23 DIAGNOSIS — N5089 Other specified disorders of the male genital organs: Secondary | ICD-10-CM | POA: Insufficient documentation

## 2024-02-23 DIAGNOSIS — R0609 Other forms of dyspnea: Secondary | ICD-10-CM | POA: Diagnosis not present

## 2024-02-23 DIAGNOSIS — Z7984 Long term (current) use of oral hypoglycemic drugs: Secondary | ICD-10-CM | POA: Diagnosis not present

## 2024-02-23 DIAGNOSIS — J4489 Other specified chronic obstructive pulmonary disease: Secondary | ICD-10-CM | POA: Diagnosis not present

## 2024-02-23 DIAGNOSIS — E039 Hypothyroidism, unspecified: Secondary | ICD-10-CM | POA: Diagnosis not present

## 2024-02-23 DIAGNOSIS — Z981 Arthrodesis status: Secondary | ICD-10-CM | POA: Insufficient documentation

## 2024-02-23 DIAGNOSIS — Z01812 Encounter for preprocedural laboratory examination: Secondary | ICD-10-CM | POA: Insufficient documentation

## 2024-02-23 DIAGNOSIS — Z794 Long term (current) use of insulin: Secondary | ICD-10-CM | POA: Diagnosis not present

## 2024-02-23 DIAGNOSIS — F419 Anxiety disorder, unspecified: Secondary | ICD-10-CM | POA: Insufficient documentation

## 2024-02-23 DIAGNOSIS — Z01818 Encounter for other preprocedural examination: Secondary | ICD-10-CM

## 2024-02-23 HISTORY — DX: Hypothyroidism, unspecified: E03.9

## 2024-02-23 LAB — HEMOGLOBIN A1C
Hgb A1c MFr Bld: 6.7 % — ABNORMAL HIGH (ref 4.8–5.6)
Mean Plasma Glucose: 146 mg/dL

## 2024-02-23 LAB — BASIC METABOLIC PANEL WITH GFR
Anion gap: 7 (ref 5–15)
BUN: 19 mg/dL (ref 8–23)
CO2: 26 mmol/L (ref 22–32)
Calcium: 8.6 mg/dL — ABNORMAL LOW (ref 8.9–10.3)
Chloride: 103 mmol/L (ref 98–111)
Creatinine, Ser: 0.98 mg/dL (ref 0.61–1.24)
GFR, Estimated: >60 mL/min (ref >60)
Glucose, Bld: 138 mg/dL — ABNORMAL HIGH (ref 70–99)
Potassium: 4.1 mmol/L (ref 3.5–5.1)
Sodium: 136 mmol/L (ref 135–145)

## 2024-02-23 LAB — CBC WITH DIFFERENTIAL/PLATELET
Abs Immature Granulocytes: 0.01 K/uL (ref 0.00–0.07)
Basophils Absolute: 0.1 K/uL (ref 0.0–0.1)
Basophils Relative: 2 %
Eosinophils Absolute: 0.6 K/uL — ABNORMAL HIGH (ref 0.0–0.5)
Eosinophils Relative: 11 %
HCT: 42.4 % (ref 39.0–52.0)
Hemoglobin: 14.2 g/dL (ref 13.0–17.0)
Immature Granulocytes: 0 %
Lymphocytes Relative: 29 %
Lymphs Abs: 1.7 K/uL (ref 0.7–4.0)
MCH: 31.2 pg (ref 26.0–34.0)
MCHC: 33.5 g/dL (ref 30.0–36.0)
MCV: 93.2 fL (ref 80.0–100.0)
Monocytes Absolute: 0.6 K/uL (ref 0.1–1.0)
Monocytes Relative: 10 %
Neutro Abs: 2.7 K/uL (ref 1.7–7.7)
Neutrophils Relative %: 48 %
Platelets: 259 K/uL (ref 150–400)
RBC: 4.55 MIL/uL (ref 4.22–5.81)
RDW: 12.5 % (ref 11.5–15.5)
WBC: 5.7 K/uL (ref 4.0–10.5)
nRBC: 0 % (ref 0.0–0.2)

## 2024-02-23 LAB — GLUCOSE, CAPILLARY: Glucose-Capillary: 165 mg/dL — ABNORMAL HIGH (ref 70–99)

## 2024-02-23 NOTE — Patient Instructions (Addendum)
 For cough/ congestion > mucinex  or Mucinex  dm (OTC) up to maximum of  1200 mg every 12 hours and use the flutter valve as much as you can    Pantoprazole  (protonix ) 40 mg   Take  30-60 min before first meal of the F[place] and Pepcid  (famotidine )  20 mg (OTC)  after supper until after the surgery   GERD (REFLUX)  is an extremely common cause of respiratory symptoms just like yours , many times with no obvious heartburn at all.    It can be treated with medication, but also with lifestyle changes including elevation of the head of your bed (ideally with 6 -8inch blocks under the headboard of your bed),  Smoking cessation, avoidance of late meals, excessive alcohol , and avoid fatty foods, chocolate, peppermint, colas, red wine, and acidic juices such as orange juice.  NO MINT OR MENTHOL  PRODUCTS SO NO COUGH DROPS  USE SUGARLESS CANDY INSTEAD (Jolley ranchers or Stover's or Life Savers) or even ice chips will also do - the key is to swallow to prevent all throat clearing. NO OIL BASED VITAMINS - use powdered substitutes.  Avoid fish oil when coughing.   Please schedule a follow up visit in 3 months but call sooner if needed    Late add: advised to let anesthesia know about his chronic need to sit up at hs but should be able to tol LE lipoma surgery from pulmonary perspective

## 2024-02-23 NOTE — Assessment & Plan Note (Addendum)
 Quit smoking June 2025  Spiro 2012:  FEV1 1.94 (52 %) but ratio normal at 84% >>suspect has airtrapping rather than restriction to explain low FVC.  - pft's 05/22/2015  No airflow obst/ nl dlco and no meds at all prior   >>> 02/23/2024  After extensive coaching inhaler device,  effectiveness =    75% (short ti)  >>> 02/23/2024   Walked on RA  x  2   lap(s) =  approx 500  ft  @ slow pace, stopped due to tired/sob  with lowest 02 sats 96%    DDX of  difficult airways management almost all start with A and  include Adherence, Ace Inhibitors, Acid Reflux, Active Sinus Disease, Alpha 1 Antitripsin deficiency, Anxiety masquerading as Airways dz,  ABPA,  Allergy(esp in young), Aspiration (esp in elderly), Adverse effects of meds,  Active smoking or vaping, A bunch of PE's (a small clot burden can't cause this syndrome unless there is already severe underlying pulm or vascular dz with poor reserve) plus two Bs  = Bronchiectasis and Beta blocker use..and one C= CHF   Adherence is always the initial prime suspect and is a multilayered concern that requires a trust but verify approach in every patient - starting with knowing how to use medications, especially inhalers, correctly, keeping up with refills and understanding the fundamental difference between maintenance and prns vs those medications only taken for a very short course and then stopped and not refilled.  - see hfa teaching  - return with all meds in hand using a trust but verify approach to confirm accurate Medication  Reconciliation The principal here is that until we are certain that the  patients are doing what we've asked, it makes no sense to ask them to do more.   ? Acid (or non-acid) GERD > always difficult to exclude as up to 75% of pts in some series report no assoc GI/ Heartburn symptoms> rec max (24h)  acid suppression and diet restrictions/ reviewed and instructions given in writing.   ? Aspiration > still at risk / reglan  can be prn  dysphagia   ? Bronchiectasis > needs hrct at some point to validate clinical dx - in meantime >>>> for  cough/ congestion > mucinex  or mucinex  dm  up to maximum of  1200 mg every 12 hours and use the flutter valve as much as you can    Each maintenance medication was reviewed in detail including emphasizing most importantly the difference between maintenance and prns and under what circumstances the prns are to be triggered using an action plan format where appropriate.  Total time for H and P, chart review, counseling, reviewing hfa/dpi/neb device(s) , directly observing portions of ambulatory 02 saturation study/ and generating customized AVS unique to this office visit / same Alix charting = 40 min with pt not seen by me in over 5 y

## 2024-02-24 ENCOUNTER — Encounter (HOSPITAL_COMMUNITY): Payer: Self-pay

## 2024-02-24 NOTE — Anesthesia Preprocedure Evaluation (Addendum)
 Anesthesia Evaluation  Patient identified by MRN, date of birth, ID band Patient awake    Reviewed: Allergy & Precautions, H&P , NPO status , Patient's Chart, lab work & pertinent test results  Airway Mallampati: II   Neck ROM: full    Dental   Pulmonary asthma , former smoker   breath sounds clear to auscultation       Cardiovascular + CAD and + Peripheral Vascular Disease   Rhythm:regular Rate:Normal  Non-obstructive CAD, medically managed.  Normal EF.   Neuro/Psych Seizures -,  PSYCHIATRIC DISORDERS Anxiety Depression       GI/Hepatic ,GERD  ,,  Endo/Other  diabetes, Type 2    Renal/GU      Musculoskeletal   Abdominal   Peds  Hematology   Anesthesia Other Findings   Reproductive/Obstetrics                              Anesthesia Physical Anesthesia Plan  ASA: 3  Anesthesia Plan: General   Post-op Pain Management:    Induction: Intravenous  PONV Risk Score and Plan: 2 and Ondansetron , Dexamethasone , Midazolam  and Treatment may vary due to age or medical condition  Airway Management Planned: LMA  Additional Equipment:   Intra-op Plan:   Post-operative Plan: Extubation in OR  Informed Consent: I have reviewed the patients History and Physical, chart, labs and discussed the procedure including the risks, benefits and alternatives for the proposed anesthesia with the patient or authorized representative who has indicated his/her understanding and acceptance.     Dental advisory given  Plan Discussed with: CRNA, Anesthesiologist and Surgeon  Anesthesia Plan Comments: (See PAT note from 8/7)         Anesthesia Quick Evaluation

## 2024-02-24 NOTE — Progress Notes (Signed)
 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 Last seen in 2023 by Dr. Michele. Prior Echo in 2023 showed normal LVEF with grade I DD and no significant valvular disease. He was advised to f/u with Pulmonology for his chronic SOB.  Last seen by Pulmonology on 02/23/24. Patient recently quit smoking in May. Patient reported DOE, cough with clear mucus, recent tx for bronchitis with abx and steroids (finished 8/5). He was advised to use Mucinex , flutter valve, Protonix . Cleared per Dr. Darlean: advised to let anesthesia know about his chronic need to sit up at hs but should be able to tol LE lipoma surgery from pulmonary perspective  VS: BP 122/73   Pulse 67   Temp 36.6 C (Oral)   Resp 16   Ht 5' 9 (1.753 m)   Wt 78 kg   SpO2 97%   BMI 25.40 kg/m   PROVIDERS: Nichole Senior, MD Pulmonology: Dr. Darlean  LABS: Labs reviewed: Acceptable for surgery. (all labs ordered are listed, but only abnormal results are displayed)  Labs Reviewed  CBC WITH DIFFERENTIAL/PLATELET - Abnormal; Notable for the following components:      Result Value   Eosinophils Absolute 0.6 (*)    All other components within normal limits  BASIC METABOLIC PANEL WITH GFR - Abnormal; Notable for the following components:   Glucose, Bld 138 (*)    Calcium 8.6 (*)    All other components within normal limits  HEMOGLOBIN A1C - Abnormal; Notable for the following  components:   Hgb A1c MFr Bld 6.7 (*)    All other components within normal limits  GLUCOSE, CAPILLARY - Abnormal; Notable for the following components:   Glucose-Capillary 165 (*)    All other components within normal limits     IMAGES:  CXR 02/13/24:   FINDINGS: The heart size and mediastinal contours are within normal limits. Mild bibasilar linear atelectasis/scarring. No focal consolidation, pleural effusion, or pneumothorax. Remote healed right-sided rib fractures. Partially visualized ACDF. No acute osseous abnormality.   IMPRESSION: Mild bibasilar linear atelectasis/scarring. Otherwise, no acute cardiopulmonary findings.  EKG 04/29/23:  Sinus rhythm, rate 90 Borderline right axis deviation  CV: Echocardiogram 03/09/2022:  Normal LV systolic function with visual EF 55-60%. Left ventricle cavity is normal in size. Normal left ventricular wall thickness. Normal global wall motion. Doppler evidence of grade I (impaired) diastolic dysfunction, normal LAP. Calculated EF 57%. Structurally normal tricuspid valve.  Mild tricuspid regurgitation. No evidence of pulmonary hypertension. No significant change compared to prior.  LHC 04/29/2020:  LM: Normal LAD: 75% stenosis in mid small caliber distal LAD. TIMI II flow, only marginally improved after IC NTG 200 mg. LCx: Large dominant vessel with TIMI II flow, only marginally improved after IC NTG 200 mg RCA: Small non-dominant vessel with TIMI II flow   Suspect microvascular dysfunction. Consider addition of calcium channel blocker.  Past Medical History:  Diagnosis Date  ANXIETY 04/03/2007   Anxiety    ASTHMA 09/06/2008   Asthma    ASTHMATIC BRONCHITIS, ACUTE 10/25/2008   Bladder neck obstruction    CARPAL TUNNEL SYNDROME, BILATERAL 07/31/2007   issues resolved, no surgery   Cervical disc disease    Chronic bronchitis (HCC)    get it about q yr (02/12/2014)   COPD (chronic obstructive pulmonary disease) (HCC)     CORONARY ARTERY DISEASE 04/03/2007   DEPRESSION 04/03/2007   Depression    DIABETES MELLITUS, TYPE I 04/03/2007   Diabetic retinopathy associated with diabetes mellitus due to underlying condition (HCC) 04/03/2007   DM W/EYE MANIFESTATIONS, TYPE I, UNCONTROLLED 04/04/2007   DM W/RENAL MNFST, TYPE I, UNCONTROLLED 04/04/2007   ED (erectile dysfunction)    History of kidney stones    HYPERLIPIDEMIA 04/04/2007   Hypothyroidism    Pneumonia    several times and again today (02/13/2104)   Renal insufficiency    Seizures (HCC)    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; none in the last couple years (02/12/2014)   Spinal stenosis     Past Surgical History:  Procedure Laterality Date   ANTERIOR CERVICAL DECOMP/DISCECTOMY FUSION  2000   couple screws and a plate   ANTERIOR CERVICAL DECOMP/DISCECTOMY FUSION N/A 06/24/2016   Procedure: ANTERIOR CERVICAL DECOMPRESSION/DISCECTOMY FUSION CERVICAL FOUR - CERVICAL FIVE, CERVICAL FIVE - CERVICAL SIX; REMOVAL TETHER CERVICAL PLATE;  Surgeon: Lamar Peaches, MD;  Location: [facility];  Service: Neurosurgery;  Laterality: N/A;  ANTERIOR CERVICAL DECOMPRESSION/DISCECTOMY FUSION CERVICAL FOUR - CERVICAL FIVE, CERVICAL FIVE - CERVICAL SIX; REMOVAL TETHER CERVICAL PLATE   APPENDECTOMY     BACK SURGERY     CARDIAC CATHETERIZATION  1990's   CATARACT EXTRACTION W/ INTRAOCULAR LENS  IMPLANT, BILATERAL Bilateral    CYSTOSCOPY WITH RETROGRADE PYELOGRAM, URETEROSCOPY AND STENT PLACEMENT Bilateral 04/06/2013   Procedure: BILATERAL CYSTOSCOPY WITH RETROGRADE PYELOGRAMS, STENT PLACEMENTS AND LEFT URETEROSCOPY AND STONE REMOVAL;  Surgeon: Ricardo Likens, MD;  Location[facility];  Service: Urology;  Laterality: Bilateral;   CYSTOSCOPY WITH STENT PLACEMENT Right 04/12/2013   Procedure: CYSTOSCOPY WITH STENT PLACEMENT;  Surgeon: Ricardo Likens, MD;  [facility];  Service: Urology;  Laterality: Right;   CYSTOSCOPY/RETROGRADE/URETEROSCOPY Bilateral 04/12/2013   Procedure:  CYSTOSCOPY/RETROGRADE/URETEROSCOPY;  Surgeon: Ricardo Likens, MD;  Location[facility];  Service: Urology;  Laterality: Bilateral;  RIGHT RETROGRADE    HOLMIUM LASER APPLICATION Left 04/06/2013   Procedure: HOLMIUM LASER APPLICATION;  Surgeon: Ricardo Likens, MD;  [facility];  Service: Urology;  Laterality: Left;   LEFT HEART CATH AND CORONARY ANGIOGRAPHY N/A 04/29/2020   Procedure: LEFT HEART CATH AND CORONARY ANGIOGRAPHY;  Surgeon: Elmira Newman PARAS, MD;  [facility]: [facility];  Service: Cardiovascular;  Laterality: N/A;   LUMBAR LAMINECTOMY/DECOMPRESSION MICRODISCECTOMY N/A 04/20/2019   Procedure: Lumbar microdisectomy and decompression L5-S1 left;  Surgeon: Heide Ingle, MD;  Location[facility];  Service: Orthopedics;  Laterality: N/A;    LYMPH NODE DISSECTION  ~ 1960   groin   stress cardiolite  09/06/2002   TONSILLECTOMY     VITRECTOMY Bilateral     MEDICATIONS:  acetaminophen  (TYLENOL ) 500 MG tablet   albuterol  (PROVENTIL ) (2.5 MG/3ML) 0.083% nebulizer solution   albuterol  (VENTOLIN  HFA) 108 (90 Base) MCG/ACT inhaler   aspirin  EC 81 MG tablet   cetirizine (ZYRTEC) 10 MG tablet   Continuous Blood Gluc Sensor (FREESTYLE LIBRE 3 SENSOR) MISC   Continuous Glucose Sensor (FREESTYLE LIBRE 3 PLUS SENSOR) MISC   Fluticasone -Umeclidin-Vilant (TRELEGY ELLIPTA ) 200-62.5-25  MCG/ACT AEPB   Glucagon  (GVOKE HYPOPEN  2-PACK) 1 MG/0.2ML SOAJ   Glucagon  (GVOKE HYPOPEN  2-PACK) 1 MG/0.2ML SOAJ   glucose blood (CONTOUR NEXT TEST) test strip   HYDROcodone -acetaminophen  (NORCO/VICODIN) 5-325 MG tablet   ibuprofen  (ADVIL ) 200 MG tablet   insulin  aspart (FIASP  FLEXTOUCH) 100 UNIT/ML FlexTouch Pen   insulin  degludec (TRESIBA  FLEXTOUCH) 100 UNIT/ML FlexTouch Pen   Insulin  Degludec FlexTouch 100 UNIT/ML SOPN   insulin  glargine (LANTUS  SOLOSTAR) 100 UNIT/ML Solostar Pen   Insulin  Pen Needle (TECHLITE PEN NEEDLES) 31G X 8 MM MISC   Insulin  Pen Needle 31G X 8 MM MISC   Insulin   Syringe-Needle U-100 31G X 5/16 0.3 ML MISC   levothyroxine  (SYNTHROID ) 100 MCG tablet   metoCLOPramide  (REGLAN ) 5 MG tablet   pantoprazole  (PROTONIX ) 40 MG tablet   promethazine  (PHENERGAN ) 25 MG tablet   simvastatin  (ZOCOR ) 40 MG tablet   tamsulosin  (FLOMAX ) 0.4 MG CAPS capsule   traZODone  (DESYREL ) 150 MG tablet   varenicline  (CHANTIX ) 1 MG tablet   No current facility-administered medications for this encounter.   Burnard CHRISTELLA Odis DEVONNA [facility] Short Stay/Anesthesiology [facility] Phone [phone] 02/24/2024 2:25 PM

## 2024-02-27 ENCOUNTER — Ambulatory Visit (HOSPITAL_BASED_OUTPATIENT_CLINIC_OR_DEPARTMENT_OTHER): Admitting: Adult Health

## 2024-03-01 ENCOUNTER — Ambulatory Visit (HOSPITAL_COMMUNITY): Payer: Self-pay | Admitting: Physician Assistant

## 2024-03-01 ENCOUNTER — Ambulatory Visit (HOSPITAL_COMMUNITY)
Admission: RE | Admit: 2024-03-01 | Discharge: 2024-03-01 | Disposition: A | Discharge status: Home / Self Care | Attending: Surgery | Admitting: Surgery

## 2024-03-01 ENCOUNTER — Encounter (HOSPITAL_COMMUNITY): Payer: Self-pay | Admitting: Surgery

## 2024-03-01 ENCOUNTER — Encounter (HOSPITAL_COMMUNITY)
Admission: RE | Disposition: A | Discharge status: Home / Self Care | Payer: Self-pay | Source: Home / Self Care | Attending: Surgery

## 2024-03-01 ENCOUNTER — Other Ambulatory Visit: Payer: Self-pay

## 2024-03-01 ENCOUNTER — Ambulatory Visit (HOSPITAL_BASED_OUTPATIENT_CLINIC_OR_DEPARTMENT_OTHER): Admitting: Anesthesiology

## 2024-03-01 ENCOUNTER — Other Ambulatory Visit (HOSPITAL_COMMUNITY): Payer: Self-pay

## 2024-03-01 DIAGNOSIS — I2510 Atherosclerotic heart disease of native coronary artery without angina pectoris: Secondary | ICD-10-CM | POA: Diagnosis not present

## 2024-03-01 DIAGNOSIS — K219 Gastro-esophageal reflux disease without esophagitis: Secondary | ICD-10-CM | POA: Insufficient documentation

## 2024-03-01 DIAGNOSIS — K4090 Unilateral inguinal hernia, without obstruction or gangrene, not specified as recurrent: Secondary | ICD-10-CM | POA: Insufficient documentation

## 2024-03-01 DIAGNOSIS — E1151 Type 2 diabetes mellitus with diabetic peripheral angiopathy without gangrene: Secondary | ICD-10-CM | POA: Diagnosis not present

## 2024-03-01 DIAGNOSIS — D1779 Benign lipomatous neoplasm of other sites: Secondary | ICD-10-CM | POA: Insufficient documentation

## 2024-03-01 DIAGNOSIS — Z87442 Personal history of urinary calculi: Secondary | ICD-10-CM | POA: Insufficient documentation

## 2024-03-01 DIAGNOSIS — Z794 Long term (current) use of insulin: Secondary | ICD-10-CM | POA: Insufficient documentation

## 2024-03-01 DIAGNOSIS — E119 Type 2 diabetes mellitus without complications: Secondary | ICD-10-CM

## 2024-03-01 DIAGNOSIS — Z87891 Personal history of nicotine dependence: Secondary | ICD-10-CM | POA: Insufficient documentation

## 2024-03-01 DIAGNOSIS — D1724 Benign lipomatous neoplasm of skin and subcutaneous tissue of left leg: Secondary | ICD-10-CM | POA: Diagnosis not present

## 2024-03-01 DIAGNOSIS — F32A Depression, unspecified: Secondary | ICD-10-CM | POA: Insufficient documentation

## 2024-03-01 DIAGNOSIS — R569 Unspecified convulsions: Secondary | ICD-10-CM | POA: Insufficient documentation

## 2024-03-01 DIAGNOSIS — E1051 Type 1 diabetes mellitus with diabetic peripheral angiopathy without gangrene: Secondary | ICD-10-CM | POA: Insufficient documentation

## 2024-03-01 DIAGNOSIS — F419 Anxiety disorder, unspecified: Secondary | ICD-10-CM | POA: Diagnosis not present

## 2024-03-01 DIAGNOSIS — J45909 Unspecified asthma, uncomplicated: Secondary | ICD-10-CM | POA: Insufficient documentation

## 2024-03-01 DIAGNOSIS — Z833 Family history of diabetes mellitus: Secondary | ICD-10-CM | POA: Diagnosis not present

## 2024-03-01 DIAGNOSIS — F418 Other specified anxiety disorders: Secondary | ICD-10-CM | POA: Diagnosis not present

## 2024-03-01 DIAGNOSIS — N5089 Other specified disorders of the male genital organs: Secondary | ICD-10-CM | POA: Diagnosis present

## 2024-03-01 HISTORY — PX: EXCISION MASS LOWER EXTREMETIES: SHX6705

## 2024-03-01 LAB — GLUCOSE, CAPILLARY
Glucose-Capillary: 300 mg/dL — ABNORMAL HIGH (ref 70–99)
Glucose-Capillary: 233 mg/dL — ABNORMAL HIGH (ref 70–99)
Glucose-Capillary: 229 mg/dL — ABNORMAL HIGH (ref 70–99)

## 2024-03-01 SURGERY — EXCISION MASS LOWER EXTREMITIES
Anesthesia: General | Laterality: Left

## 2024-03-01 MED ORDER — OXYCODONE HCL 5 MG/5ML PO SOLN: 5.0000 mg | Freq: Once | ORAL | Status: AC | PRN

## 2024-03-01 MED ORDER — FENTANYL CITRATE PF 50 MCG/ML IJ SOSY
PREFILLED_SYRINGE | INTRAMUSCULAR | Status: AC
  Filled 2024-03-01: qty 2

## 2024-03-01 MED ORDER — FENTANYL CITRATE PF 50 MCG/ML IJ SOSY
PREFILLED_SYRINGE | INTRAMUSCULAR | Status: AC
  Filled 2024-03-01: qty 1

## 2024-03-01 MED ORDER — ONDANSETRON HCL 4 MG/2ML IJ SOLN
INTRAMUSCULAR | Status: DC | PRN
  Administered 2024-03-01: 4 mg via INTRAVENOUS

## 2024-03-01 MED ORDER — PHENYLEPHRINE 80 MCG/ML (10ML) SYRINGE FOR IV PUSH (FOR BLOOD PRESSURE SUPPORT)
PREFILLED_SYRINGE | INTRAVENOUS | Status: AC
  Filled 2024-03-01: qty 10

## 2024-03-01 MED ORDER — PROPOFOL 10 MG/ML IV BOLUS
INTRAVENOUS | Status: DC | PRN
  Administered 2024-03-01: 30 mg via INTRAVENOUS
  Administered 2024-03-01: 200 mg via INTRAVENOUS
  Administered 2024-03-01: 50 mg via INTRAVENOUS

## 2024-03-01 MED ORDER — PHENYLEPHRINE 80 MCG/ML (10ML) SYRINGE FOR IV PUSH (FOR BLOOD PRESSURE SUPPORT)
PREFILLED_SYRINGE | INTRAVENOUS | Status: DC | PRN
  Administered 2024-03-01 (×5): 80 ug via INTRAVENOUS
  Administered 2024-03-01: 160 ug via INTRAVENOUS

## 2024-03-01 MED ORDER — CEFAZOLIN SODIUM-DEXTROSE 2-4 GM/100ML-% IV SOLN
2.0000 g | INTRAVENOUS | Status: AC
  Administered 2024-03-01: 2 g via INTRAVENOUS
  Filled 2024-03-01: qty 100

## 2024-03-01 MED ORDER — FENTANYL CITRATE (PF) 100 MCG/2ML IJ SOLN
INTRAMUSCULAR | Status: AC
  Filled 2024-03-01: qty 2

## 2024-03-01 MED ORDER — ONDANSETRON HCL 4 MG/2ML IJ SOLN: 4.0000 mg | Freq: Four times a day (QID) | INTRAMUSCULAR | Status: DC | PRN

## 2024-03-01 MED ORDER — OXYCODONE HCL 5 MG PO TABS
5.0000 mg | ORAL_TABLET | Freq: Once | ORAL | Status: AC | PRN
  Administered 2024-03-01: 5 mg via ORAL

## 2024-03-01 MED ORDER — ORAL CARE MOUTH RINSE: 15.0000 mL | Freq: Once | OROMUCOSAL | Status: DC

## 2024-03-01 MED ORDER — OXYCODONE HCL 5 MG PO TABS
ORAL_TABLET | ORAL | Status: AC
Start: 2024-03-01 — End: 2024-03-01
  Filled 2024-03-01: qty 1

## 2024-03-01 MED ORDER — LIDOCAINE HCL 1 % IJ SOLN
INTRAMUSCULAR | Status: DC | PRN
  Administered 2024-03-01: 26 mL

## 2024-03-01 MED ORDER — ONDANSETRON HCL 4 MG/2ML IJ SOLN
INTRAMUSCULAR | Status: AC
  Filled 2024-03-01: qty 2

## 2024-03-01 MED ORDER — LIDOCAINE HCL (PF) 1 % IJ SOLN
INTRAMUSCULAR | Status: AC
  Filled 2024-03-01: qty 30

## 2024-03-01 MED ORDER — HYDROCODONE-ACETAMINOPHEN 5-325 MG PO TABS
1.0000 | ORAL_TABLET | Freq: Three times a day (TID) | ORAL | 0 refills | Status: DC | PRN
Start: 2024-03-01 — End: 2024-04-02
  Filled 2024-03-01 – 2024-03-05 (×2): qty 60, 20d supply, fill #0

## 2024-03-01 MED ORDER — PROPOFOL 10 MG/ML IV BOLUS
INTRAVENOUS | Status: AC
  Filled 2024-03-01: qty 20

## 2024-03-01 MED ORDER — FENTANYL CITRATE (PF) 100 MCG/2ML IJ SOLN
INTRAMUSCULAR | Status: DC | PRN
  Administered 2024-03-01 (×2): 50 ug via INTRAVENOUS

## 2024-03-01 MED ORDER — CHLORHEXIDINE GLUCONATE CLOTH 2 % EX PADS: 6.0000 | MEDICATED_PAD | Freq: Once | CUTANEOUS | Status: DC

## 2024-03-01 MED ORDER — FENTANYL CITRATE PF 50 MCG/ML IJ SOSY
25.0000 ug | PREFILLED_SYRINGE | INTRAMUSCULAR | Status: DC | PRN
  Administered 2024-03-01: 50 ug via INTRAVENOUS
  Administered 2024-03-01 (×2): 25 ug via INTRAVENOUS
  Administered 2024-03-01: 50 ug via INTRAVENOUS

## 2024-03-01 MED ORDER — DEXAMETHASONE SODIUM PHOSPHATE 10 MG/ML IJ SOLN
INTRAMUSCULAR | Status: DC | PRN
  Administered 2024-03-01: 4 mg via INTRAVENOUS

## 2024-03-01 MED ORDER — FENTANYL CITRATE (PF) 100 MCG/2ML IJ SOLN
INTRAMUSCULAR | Status: AC
Start: 2024-03-01 — End: 2024-03-01
  Filled 2024-03-01: qty 2

## 2024-03-01 MED ORDER — MIDAZOLAM HCL 5 MG/5ML IJ SOLN
INTRAMUSCULAR | Status: DC | PRN
  Administered 2024-03-01: 2 mg via INTRAVENOUS

## 2024-03-01 MED ORDER — LIDOCAINE HCL (PF) 2 % IJ SOLN
INTRAMUSCULAR | Status: AC
  Filled 2024-03-01: qty 5

## 2024-03-01 MED ORDER — DEXAMETHASONE SODIUM PHOSPHATE 10 MG/ML IJ SOLN
INTRAMUSCULAR | Status: AC
  Filled 2024-03-01: qty 1

## 2024-03-01 MED ORDER — OXYCODONE HCL 5 MG PO TABS
5.0000 mg | ORAL_TABLET | Freq: Four times a day (QID) | ORAL | 0 refills | Status: AC | PRN
  Filled 2024-03-01: qty 15, 4d supply, fill #0

## 2024-03-01 MED ORDER — CHLORHEXIDINE GLUCONATE 0.12 % MT SOLN: 15.0000 mL | Freq: Once | OROMUCOSAL | Status: DC

## 2024-03-01 MED ORDER — ACETAMINOPHEN 500 MG PO TABS
1000.0000 mg | ORAL_TABLET | ORAL | Status: DC
  Filled 2024-03-01: qty 2

## 2024-03-01 MED ORDER — 0.9 % SODIUM CHLORIDE (POUR BTL) OPTIME
TOPICAL | Status: DC | PRN
  Administered 2024-03-01: 1000 mL

## 2024-03-01 MED ORDER — MIDAZOLAM HCL 2 MG/2ML IJ SOLN
INTRAMUSCULAR | Status: AC
  Filled 2024-03-01: qty 2

## 2024-03-01 MED ORDER — LACTATED RINGERS IV SOLN: INTRAVENOUS | Status: DC

## 2024-03-01 MED ORDER — LIDOCAINE HCL (PF) 2 % IJ SOLN
INTRAMUSCULAR | Status: DC | PRN
  Administered 2024-03-01: 60 mg via INTRADERMAL

## 2024-03-01 MED ORDER — DEXMEDETOMIDINE HCL IN NACL 80 MCG/20ML IV SOLN
INTRAVENOUS | Status: DC | PRN
  Administered 2024-03-01: 8 ug via INTRAVENOUS

## 2024-03-01 MED ORDER — DEXMEDETOMIDINE HCL IN NACL 80 MCG/20ML IV SOLN
INTRAVENOUS | Status: AC
Start: 2024-03-01 — End: 2024-03-01
  Filled 2024-03-01: qty 20

## 2024-03-01 SURGICAL SUPPLY — 28 items
BAG COUNTER SPONGE SURGICOUNT (BAG) IMPLANT
BENZOIN TINCTURE PRP APPL 2/3 (GAUZE/BANDAGES/DRESSINGS) IMPLANT
BINDER BREAST LRG (GAUZE/BANDAGES/DRESSINGS) IMPLANT
BINDER BREAST XLRG (GAUZE/BANDAGES/DRESSINGS) IMPLANT
BLADE SURG 15 STRL LF DISP TIS (BLADE) ×1 IMPLANT
DERMABOND ADVANCED .7 DNX6 (GAUZE/BANDAGES/DRESSINGS) IMPLANT
DRAPE LAPAROTOMY T 102X78X121 (DRAPES) IMPLANT
ELECT REM PT RETURN 15FT ADLT (MISCELLANEOUS) ×1 IMPLANT
GAUZE PACKING IODOFORM 1/4X15 (PACKING) IMPLANT
GAUZE SPONGE 4X4 12PLY STRL (GAUZE/BANDAGES/DRESSINGS) ×1 IMPLANT
GLOVE BIO SURGEON STRL SZ7.5 (GLOVE) ×1 IMPLANT
GLOVE BIOGEL PI IND STRL 8 (GLOVE) ×1 IMPLANT
GOWN STRL REUS W/ TWL XL LVL3 (GOWN DISPOSABLE) ×2 IMPLANT
KIT BASIN OR (CUSTOM PROCEDURE TRAY) ×1 IMPLANT
KIT TURNOVER KIT A (KITS) ×1 IMPLANT
MARKER SKIN DUAL TIP RULER LAB (MISCELLANEOUS) ×1 IMPLANT
NDL HYPO 22X1.5 SAFETY MO (MISCELLANEOUS) ×1 IMPLANT
NEEDLE HYPO 22X1.5 SAFETY MO (MISCELLANEOUS) ×1 IMPLANT
PACK GENERAL/GYN (CUSTOM PROCEDURE TRAY) ×1 IMPLANT
PAD TELFA 2X3 NADH STRL (GAUZE/BANDAGES/DRESSINGS) IMPLANT
SPIKE FLUID TRANSFER (MISCELLANEOUS) IMPLANT
SPONGE T-LAP 4X18 ~~LOC~~+RFID (SPONGE) ×1 IMPLANT
STAPLER SKIN PROX 35W (STAPLE) IMPLANT
SUT MNCRL AB 4-0 PS2 18 (SUTURE) IMPLANT
SUT VIC AB 2-0 SH 27X BRD (SUTURE) IMPLANT
SUT VIC AB 4-0 SH 18 (SUTURE) IMPLANT
SUT VICRYL 3-0 CR8 SH (SUTURE) IMPLANT
SYR 20ML LL LF (SYRINGE) ×1 IMPLANT

## 2024-03-01 NOTE — Anesthesia Procedure Notes (Signed)
 Procedure Name: LMA Insertion Date/Time: 03/01/2024 7:41 AM  Performed by: Franchot Delon RAMAN, CRNAPre-anesthesia Checklist: Patient identified, Emergency Drugs available, Suction available and Patient being monitored Patient Re-evaluated:Patient Re-evaluated prior to induction Oxygen  Delivery Method: Circle System Utilized Preoxygenation: Pre-oxygenation with 100% oxygen  Induction Type: IV induction Ventilation: Mask ventilation without difficulty LMA: LMA inserted LMA Size: 4.0 Number of attempts: 1 Placement Confirmation: positive ETCO2 Tube secured with: Tape Dental Injury: Teeth and Oropharynx as per pre-operative assessment

## 2024-03-01 NOTE — Transfer of Care (Signed)
 Immediate Anesthesia Transfer of Care Note  Patient: James [name]  Procedure(s) Performed: EXCISION MASS LOWER EXTREMITIES (Left)  Patient Location: PACU  Anesthesia Type:General  Level of Consciousness: drowsy, patient cooperative, and responds to stimulation  Airway & Oxygen  Therapy: Patient Spontanous Breathing and Patient connected to face mask oxygen   Post-op Assessment: Report given to RN and Post -op Vital signs reviewed and stable  Post vital signs: Reviewed and stable  Last Vitals:  Vitals Value Taken Time  BP 123/65 03/01/24 08:37  Temp    Pulse 73 03/01/24 08:40  Resp 15 03/01/24 08:40  SpO2 100 % 03/01/24 08:40  Vitals shown include unfiled device data.  Last Pain:  Vitals:   03/01/24 0626  TempSrc:   PainSc: 0-No pain         Complications: No notable events documented.

## 2024-03-01 NOTE — H&P (Signed)
 CC: Here today for surgery  HPI: James [name] is an 68 y.o. male with history of DM, whom is seen in the office today as a referral by Dr. Eletha for a perineal lipoma  Pelvic MRI 07/17/23 1. Palpable area of clinical concern most likely corresponds to a lipoma. No abnormally enhancing internal solid components or intraperitoneal extension. 2. Small fat containing left inguinal hernia.  He has seen my partner, Dr. Eletha, who had obtained the MRI and subsequently referred to see me for consideration of removal.  Patient reports that this has been present for at least the last 2 to 3 years but has been steadily increasing in size. He noted that it was only a couple centimeters in size when he first noticed it 3 years ago. He denies any significant pain related to this but has noted has gotten larger. He reports that he was recommended by his wife to come and see us  to consider having this removed.  He denies any changes in health or health history since we met in the office. No new medications/allergies. He states he is ready for surgery today.   PMH: DM-reports good control with insulin   PSH: Lymph node removed from his groin as a child, reports it was nothing.; Potential fatty tumor removed similar from his scalp many years ago.  FHx: Denies any known family history of colorectal, breast, endometrial or ovarian cancer  Social Hx: Reports that he had quit smoking 2 years ago but started up again in the last month but quit again last week. Rare EtOH use. He is semiretired-previously worked for [employer] and in [place]. This was in [employer] area. 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    Past Medical History:  Diagnosis Date   Anxiety    Asthma    ASTHMATIC BRONCHITIS, ACUTE 10/25/2008   Bladder neck obstruction    CARPAL TUNNEL SYNDROME, BILATERAL 07/31/2007   issues resolved, no surgery   Cervical disc disease    CORONARY ARTERY DISEASE  04/03/2007   Depression    DIABETES MELLITUS, TYPE I 04/03/2007   Diabetic retinopathy associated with diabetes mellitus due to underlying condition (HCC) 04/03/2007   DM W/EYE MANIFESTATIONS, TYPE I, UNCONTROLLED 04/04/2007   DM W/RENAL MNFST, TYPE I, UNCONTROLLED 04/04/2007   ED (erectile dysfunction)    History of kidney stones    HYPERLIPIDEMIA 04/04/2007   Hypothyroidism    Pneumonia    several times and again today (02/13/2104)   Renal insufficiency    Seizures (HCC)    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; none in the last couple years (02/12/2014)   Spinal stenosis     Past Surgical History:  Procedure Laterality Date   ANTERIOR CERVICAL DECOMP/DISCECTOMY FUSION  2000   couple screws and a plate   ANTERIOR CERVICAL DECOMP/DISCECTOMY FUSION N/A 06/24/2016   Procedure: ANTERIOR CERVICAL DECOMPRESSION/DISCECTOMY FUSION CERVICAL FOUR - CERVICAL FIVE, CERVICAL FIVE - CERVICAL SIX; REMOVAL TETHER CERVICAL PLATE;  Surgeon: Lamar Peaches, MD;  Location: [facility];  Service: Neurosurgery;  Laterality: N/A;  ANTERIOR CERVICAL DECOMPRESSION/DISCECTOMY FUSION CERVICAL FOUR - CERVICAL FIVE, CERVICAL FIVE - CERVICAL SIX; REMOVAL TETHER CERVICAL PLATE   APPENDECTOMY     BACK SURGERY     CARDIAC CATHETERIZATION  1990's   CATARACT EXTRACTION W/ INTRAOCULAR LENS  IMPLANT, BILATERAL Bilateral    CYSTOSCOPY WITH RETROGRADE PYELOGRAM, URETEROSCOPY AND STENT PLACEMENT Bilateral 04/06/2013   Procedure: BILATERAL CYSTOSCOPY WITH RETROGRADE PYELOGRAMS, STENT PLACEMENTS AND  LEFT URETEROSCOPY AND STONE REMOVAL;  Surgeon: Ricardo Likens, MD;  Location: [facility];  Service: Urology;  Laterality: Bilateral;   CYSTOSCOPY WITH STENT PLACEMENT Right 04/12/2013   Procedure: CYSTOSCOPY WITH STENT PLACEMENT;  Surgeon: Ricardo Likens, MD;  Location: [facility];  Service: Urology;  Laterality: Right;   CYSTOSCOPY/RETROGRADE/URETEROSCOPY Bilateral 04/12/2013   Procedure: CYSTOSCOPY/RETROGRADE/URETEROSCOPY;  Surgeon: Ricardo Likens, MD;  Location: [facility];  Service: Urology;  Laterality: Bilateral;  RIGHT RETROGRADE    HOLMIUM LASER APPLICATION Left 04/06/2013   Procedure: HOLMIUM LASER APPLICATION;  Surgeon: Ricardo Likens, MD;  Location: [facility];  Service: Urology;  Laterality: Left;   LEFT HEART CATH AND CORONARY ANGIOGRAPHY N/A 04/29/2020   Procedure: LEFT HEART CATH AND CORONARY ANGIOGRAPHY;  Surgeon: Elmira Newman PARAS, MD;  Location: [facility];  Service: Cardiovascular;  Laterality: N/A;   LUMBAR LAMINECTOMY/DECOMPRESSION MICRODISCECTOMY N/A 04/20/2019   Procedure: Lumbar microdisectomy and decompression L5-S1 left;  Surgeon: Heide Ingle, MD;  Location: [facility];  Service: Orthopedics;  Laterality: N/A;    LYMPH NODE DISSECTION  ~ 1960   groin   stress cardiolite  09/06/2002   TONSILLECTOMY     VITRECTOMY Bilateral     Family History  Problem Relation Age of Onset   Stroke Father        strong FH cerebrovascular disease   Diabetes Brother    Heart disease Brother        CHF   Cancer Mother    Colon cancer Neg Hx     Social:  reports that he quit smoking about 4 years ago. His smoking use included cigarettes. He started smoking about 44 years ago. He has a 40 pack-year smoking history. He has been exposed to tobacco smoke. He has never used smokeless tobacco. He reports that he does not drink alcohol  and does not use drugs.  Allergies:  Allergies  Allergen Reactions   Augmentin  [Amoxicillin -Pot Clavulanate] Nausea And Vomiting and Other (See Comments)    Did it involve swelling of the face/tongue/throat, SOB, or low BP? No Did it involve sudden or severe rash/hives, skin peeling, or any reaction on the inside of your mouth or nose? No Did you need to seek medical attention at a hospital or doctor's office? No When did it last happen?      5+ years If all above answers are "NO", may proceed with cephalosporin use.    Lexapro [Escitalopram Oxalate] Itching    Medications: I have  reviewed the patient's current medications.  Results for orders placed or performed during the hospital encounter of 03/01/24 (from the past 48 hours)  Glucose, capillary     Status: Abnormal   Collection Time: 03/01/24  6:21 AM  Result Value Ref Range   Glucose-Capillary 300 (H) 70 - 99 mg/dL    Comment: Glucose reference range applies only to samples taken after fasting for at least 8 hours.   Comment 1 Notify RN    *Note: Due to a large number of results and/or encounters for the requested time period, some results have not been displayed. A complete set of results can be found in Results Review.    No results found.   PE Blood pressure (!) 146/63, pulse 86, temperature 98.2 F (36.8 C), temperature source Oral, resp. rate 13, height 5' 9 (1.753 m), weight 78.7 kg, SpO2 95%. Constitutional: NAD; conversant Eyes: Moist conjunctiva; no lid lag; anicteric Lungs: Normal respiratory effort CV: RRR Psychiatric: Appropriate affect  Results for orders placed or  performed during the hospital encounter of 03/01/24 (from the past 48 hours)  Glucose, capillary     Status: Abnormal   Collection Time: 03/01/24  6:21 AM  Result Value Ref Range   Glucose-Capillary 300 (H) 70 - 99 mg/dL    Comment: Glucose reference range applies only to samples taken after fasting for at least 8 hours.   Comment 1 Notify RN    *Note: Due to a large number of results and/or encounters for the requested time period, some results have not been displayed. A complete set of results can be found in Results Review.    No results found.  A/P: James [name] is an 68 y.o. male with hx of DM here for evaluation of left inner thigh mass-MRI suggests it could be a lipoma  - We spent time today discussing the relevant anatomy. We spent time discussing these soft tissue tumors/masses. MRI suggest this could be a lipoma. We did discuss situations where this could be something more such as a liposarcoma but nothing at  present suggest that to clearly be the case. - It has been growing in size. He is interested in having it removed. We discussed removal of left inner thigh lipoma. - The procedure, material risks (including, but not limited to, pain, bleeding, wound infection which may result in open wound and delayed healing, hematoma, seroma, scarring, damage to surrounding structures- blood vessels/nerves/viscus/organs, need for additional procedures, recurrence, pneumonia, heart attack, stroke, death) benefits and alternatives to surgery were discussed at length. The patient's questions were answered to his satisfaction, he voiced understanding and they elected to proceed with surgery. Additionally, we discussed typical postoperative expectations and the recovery process.   Lonni Pizza, MD [facility]

## 2024-03-01 NOTE — Discharge Instructions (Addendum)
POST OP INSTRUCTIONS  DIET: As tolerated. Follow a light bland diet the first 24 hours after arrival home, such as soup, liquids, crackers, etc.  Be sure to include lots of fluids daily.  Avoid fast food or heavy meals as your are more likely to get nauseated.  Eat a low fat the next few days after surgery.  Take your usually prescribed home medications unless otherwise directed.  PAIN CONTROL: Pain is best controlled by a usual combination of three different methods TOGETHER: Ice/Heat Over the counter pain medication Prescription pain medication Most patients will experience some swelling and bruising around the surgical site.  Ice packs or heating pads (30-60 minutes up to 6 times a day) will help. Some people prefer to use ice alone, heat alone, alternating between ice & heat.  Experiment to what works for you.  Swelling and bruising can take several weeks to resolve.   It is helpful to take an over-the-counter pain medication regularly for the first few weeks: Ibuprofen (Motrin/Advil) - '200mg'$  tabs - take 3 tabs ('600mg'$ ) every 6 hours as needed for pain Acetaminophen (Tylenol) - you may take '650mg'$  every 6 hours as needed. You can take this with motrin as they act differently on the body. If you are taking a narcotic pain medication that has acetaminophen in it, do not take over the counter tylenol at the same time.  Iii. NOTE: You may take both of these medications together - most patients  find it most helpful when alternating between the two (i.e. Ibuprofen at 6am,  tylenol at 9am, ibuprofen at 12pm ..[name]) A  prescription for pain medication should be given to you upon discharge.  Take your pain medication as prescribed if your pain is not adequatly controlled with the over-the-counter pain reliefs mentioned above.  Avoid getting constipated.  Between the surgery and the pain medications, it is common to experience some constipation.  Increasing fluid intake and taking a fiber supplement (such as  Metamucil, Citrucel, FiberCon, MiraLax, etc) 1-2 times a day regularly will usually help prevent this problem from occurring.  A mild laxative (prune juice, Milk of Magnesia, MiraLax, etc) should be taken according to package directions if there are no bowel movements after 48 hours.    Dressing: Your incision is covered in Dermabond which is like sterile superglue for the skin. This will come off on it's own in a couple weeks. It is waterproof and you may bathe normally starting the day after your surgery in a shower. Avoid baths/pools/lakes/oceans until your wounds have fully healed.  ACTIVITIES as tolerated:   Avoid heavy lifting (>10lbs or 1 gallon of milk) for the next 6 weeks. You may resume regular (light) daily activities beginning the next day--such as daily self-care, walking, climbing stairs--gradually increasing activities as tolerated.  If you can walk 30 minutes without difficulty, it is safe to try more intense activity such as jogging, treadmill, bicycling, low-impact aerobics.  DO NOT PUSH THROUGH PAIN.  Let pain be your guide: If it hurts to do something, don't do it. You may drive when you are no longer taking prescription pain medication, you can comfortably wear a seatbelt, and you can safely maneuver your car and apply brakes.   FOLLOW UP in our office Please call [facility] at [phone] to set up an appointment to see your surgeon in the office for a follow-up appointment approximately 2 weeks after your surgery. Make sure that you call for this appointment the day you arrive home to  insure a convenient appointment time.  9. If you have disability or family leave forms that need to be completed, you may have them completed by your primary care physician's office; for return to work instructions, please ask our office staff and they will be happy to assist you in obtaining this documentation   When to call us [phone]: Poor pain control Reactions / problems with new  medications (rash/itching, etc)  Fever over 101.5 F (38.5 C) Inability to urinate Nausea/vomiting Worsening swelling or bruising Continued bleeding from incision. Increased pain, redness, or drainage from the incision  The clinic staff is available to answer your questions during regular business hours (8:30am-5pm).  Please don't hesitate to call and ask to speak to one of our nurses for clinical concerns.   A surgeon from [facility] is always on call at the hospitals   If you have a medical emergency, go to the nearest emergency room or call 911.  [facility] [address], [address], [place], [place]  [address] MAIN: [phone] FAX: [phone] [email or web address]  

## 2024-03-01 NOTE — Anesthesia Postprocedure Evaluation (Signed)
 Anesthesia Post Note  Patient: Kano Heckmann Schaben  Procedure(s) Performed: EXCISION MASS LOWER EXTREMITIES (Left)     Patient location during evaluation: PACU Anesthesia Type: General Level of consciousness: awake and alert Pain management: pain level controlled Vital Signs Assessment: post-procedure vital signs reviewed and stable Respiratory status: spontaneous breathing, nonlabored ventilation, respiratory function stable and patient connected to nasal cannula oxygen  Cardiovascular status: blood pressure returned to baseline and stable Postop Assessment: no apparent nausea or vomiting Anesthetic complications: no   No notable events documented.  Last Vitals:  Vitals:   03/01/24 1000 03/01/24 1023  BP: (!) 147/69 (!) 148/73  Pulse: 75 83  Resp: 13 14  Temp: 36.7 C 36.8 C  SpO2: 94% 95%    Last Pain:  Vitals:   03/01/24 1023  TempSrc:   PainSc: 4                  Muhammed Teutsch S

## 2024-03-01 NOTE — Op Note (Signed)
 03/01/2024  8:28 AM  PATIENT:  James [name]  68 y.o. male  Patient Care Team: Nichole Senior, MD as PCP - General (Endocrinology)  PRE-OPERATIVE DIAGNOSIS:  Left inner thigh/perineal lipoma  POST-OPERATIVE DIAGNOSIS:  Same  PROCEDURE:  Removal of left inner thigh lipoma 7 x 5 x 7 cm  SURGEON:  Lonni HERO. Tahira Olivarez, MD  ASSISTANT: OR staff  ANESTHESIA:   local and general  COUNTS:  Sponge, needle and instrument counts were reported correct x2 at the conclusion of the operation.  EBL: 5 mL  DRAINS: none  SPECIMEN: Left inner thigh mass (short stitch superior, long stitch lateral)  COMPLICATIONS: none  FINDINGS: Left inner thigh soft tissue mass clinically consistent with lipoma (7 x 4 x 6 cm) fully excised.  DISPOSITION: PACU in satisfactory condition       DESCRIPTION:  The patient was seen in the pre-op holding area. The risks, benefits, complications, treatment options, and expected outcomes were previously discussed with the patient. The patient agreed with the proposed plan and has signed the informed consent form. The patient was brought to the operating room by the surgical team, identified as James [name], and the procedure verified. placed supine on the operating table and SCD's were applied. General anesthesia was induced without difficulty. He was positioned in lithotomy with Allen stirrups.  Pressure points were evaluated and padded.  Hair on the thigh was clipped.  He was secured to the operating table. The abdomen was then prepped and draped in the standard sterile fashion. A time out was completed and the above information confirmed and need for preoperative antibiotics.  An elliptical incision was created overlying the soft tissue mass of the left inner thigh/perineum region.  The underlying subcutaneous tissue was divided electrocautery.  We immediately encountered the lipoma which is encapsulated.  This was then carefully dissected from surrounding tissue.  This  extends into the inner thigh and into the peritoneum.  It goes down to the level of fascia but does not violate any of the muscular fascia.  Fatty tissue is soft and mobile.  Clinically, this all appears consistent with a large lipoma.  This was fully excised and oriented with suture-short stitch superior, long stitch lateral (thigh side).  The wound is irrigated.  Hemostasis is verified.  It appears that we will be able to close this in layers without leaving a large cavity.  The wound is then closed in layers using 3-0 Vicryl deep dermal sutures to obliterate the dead space.  There is good skin apposition.  The skin is then closed using a 4-0 Monocryl subcuticular suture.  All sponge, needle, and instrument counts were reported correct.  Additional local anesthetic is infiltrated.  The wound is washed and dried.  Dermabond is applied.  He is taken out of lithotomy position, awakened from anesthesia, extubated, and transported recovery in satisfactory condition.

## 2024-03-02 ENCOUNTER — Other Ambulatory Visit (HOSPITAL_COMMUNITY): Payer: Self-pay

## 2024-03-02 ENCOUNTER — Encounter (HOSPITAL_COMMUNITY): Payer: Self-pay | Admitting: Surgery

## 2024-03-02 LAB — SURGICAL PATHOLOGY

## 2024-03-05 ENCOUNTER — Other Ambulatory Visit (HOSPITAL_COMMUNITY): Payer: Self-pay

## 2024-03-07 ENCOUNTER — Ambulatory Visit: Payer: Self-pay | Admitting: Surgery

## 2024-03-08 ENCOUNTER — Other Ambulatory Visit (HOSPITAL_COMMUNITY): Payer: Self-pay

## 2024-03-08 ENCOUNTER — Telehealth (HOSPITAL_BASED_OUTPATIENT_CLINIC_OR_DEPARTMENT_OTHER): Payer: Self-pay | Admitting: Adult Health

## 2024-03-08 MED ORDER — TRELEGY ELLIPTA 200-62.5-25 MCG/ACT IN AEPB: 1.0000 | INHALATION_SPRAY | Freq: Every day | RESPIRATORY_TRACT | Status: DC

## 2024-03-08 NOTE — Telephone Encounter (Signed)
 Application was not completed with required documentation, will await documentation and then will fax

## 2024-03-08 NOTE — Telephone Encounter (Signed)
 Patient dropped off [employer] application. Placed in RA folder.   Patient also inquired if he could get another sample of trellegy.States his current sample will last him 8 more days. Please advise

## 2024-03-12 ENCOUNTER — Other Ambulatory Visit: Payer: Self-pay

## 2024-03-12 ENCOUNTER — Other Ambulatory Visit (HOSPITAL_COMMUNITY): Payer: Self-pay

## 2024-03-20 ENCOUNTER — Other Ambulatory Visit (HOSPITAL_COMMUNITY): Payer: Self-pay

## 2024-03-20 MED ORDER — TAMSULOSIN HCL 0.4 MG PO CAPS
0.4000 mg | ORAL_CAPSULE | Freq: Every day | ORAL | 3 refills | Status: AC
  Filled 2024-03-20: qty 90, 90d supply, fill #0
  Filled 2024-06-13 – 2024-06-29 (×2): qty 90, 90d supply, fill #1

## 2024-03-22 ENCOUNTER — Other Ambulatory Visit (HOSPITAL_COMMUNITY): Payer: Self-pay

## 2024-03-22 MED ORDER — LEVOTHYROXINE SODIUM 100 MCG PO TABS
100.0000 ug | ORAL_TABLET | Freq: Every day | ORAL | 4 refills | Status: AC
  Filled 2024-03-22: qty 90, 90d supply, fill #0
  Filled 2024-07-29: qty 90, 90d supply, fill #1

## 2024-03-23 ENCOUNTER — Other Ambulatory Visit (HOSPITAL_COMMUNITY): Payer: Self-pay

## 2024-03-23 MED ORDER — TAMSULOSIN HCL 0.4 MG PO CAPS
0.4000 mg | ORAL_CAPSULE | Freq: Every day | ORAL | 3 refills | Status: DC
  Filled 2024-03-23: qty 90, 90d supply, fill #0

## 2024-03-23 MED ORDER — FINASTERIDE 5 MG PO TABS
5.0000 mg | ORAL_TABLET | Freq: Every day | ORAL | 3 refills | Status: AC
  Filled 2024-03-23 – 2024-04-02 (×2): qty 90, 90d supply, fill #0
  Filled 2024-06-26: qty 90, 90d supply, fill #1

## 2024-03-31 ENCOUNTER — Other Ambulatory Visit (HOSPITAL_COMMUNITY): Payer: Self-pay

## 2024-04-01 ENCOUNTER — Other Ambulatory Visit (HOSPITAL_COMMUNITY): Payer: Self-pay

## 2024-04-01 MED ORDER — INSULIN LISPRO (1 UNIT DIAL) 100 UNIT/ML (KWIKPEN)
100.0000 [IU] | PEN_INJECTOR | Freq: Every day | SUBCUTANEOUS | 5 refills | Status: DC
  Filled 2024-04-01 – 2024-04-02 (×2): qty 45, 45d supply, fill #0

## 2024-04-01 MED ORDER — FIASP FLEXTOUCH 100 UNIT/ML ~~LOC~~ SOPN
100.0000 [IU] | PEN_INJECTOR | Freq: Every day | SUBCUTANEOUS | 3 refills | Status: DC
  Filled 2024-04-01 – 2024-04-02 (×3): qty 90, 90d supply, fill #0

## 2024-04-02 ENCOUNTER — Other Ambulatory Visit: Payer: Self-pay

## 2024-04-02 ENCOUNTER — Other Ambulatory Visit (HOSPITAL_COMMUNITY): Payer: Self-pay

## 2024-04-02 ENCOUNTER — Ambulatory Visit: Payer: Self-pay

## 2024-04-02 MED ORDER — NOVOLOG FLEXPEN 100 UNIT/ML ~~LOC~~ SOPN
100.0000 [IU] | PEN_INJECTOR | Freq: Every day | SUBCUTANEOUS | 5 refills | Status: DC
  Filled 2024-04-02: qty 90, 90d supply, fill #0

## 2024-04-02 MED ORDER — HYDROCODONE-ACETAMINOPHEN 5-325 MG PO TABS
1.0000 | ORAL_TABLET | Freq: Three times a day (TID) | ORAL | 0 refills | Status: DC | PRN
  Filled 2024-04-02: qty 60, 20d supply, fill #0

## 2024-04-02 MED ORDER — NOVOLOG FLEXPEN 100 UNIT/ML ~~LOC~~ SOPN
100.0000 [IU] | PEN_INJECTOR | Freq: Every day | SUBCUTANEOUS | 3 refills | Status: DC
  Filled 2024-04-02: qty 90, 90d supply, fill #0

## 2024-04-02 NOTE — Telephone Encounter (Signed)
 Pt was scheduled acute ov 9/16. Closing encounter.

## 2024-04-02 NOTE — Telephone Encounter (Signed)
 FYI Only or Action Required?: FYI only for provider.  Patient is followed in Pulmonology for chronic bronchitis, last seen on 02/23/2024 by Darlean Ozell NOVAK, MD.  Called Nurse Triage reporting Cough.  Symptoms began a week ago.  Interventions attempted: OTC medications: delsym .  Symptoms are: unchanged.  Triage Disposition: See Physician Within 24 Hours  Patient/caregiver understands and will follow disposition?: Yes    Copied from CRM #[id number]. Topic: Clinical - Red Word Triage >> Apr 02, 2024 10:50 AM Lavanda D wrote: Red Word that prompted transfer to Nurse Triage: Discolored Mucus: Excessive coughing + Excessive phlegm - dark greenish colored mucus, muscle fatigue/weakness, headache. Patient states he's feeling bad. Reason for Disposition  SEVERE coughing spells (e.g., whooping sound after coughing, vomiting after coughing)  Answer Assessment - Initial Assessment Questions Patient says he's having more secretions coming up when he coughs, green colored, coughing more than usual. He says his SOB no more than usual, chest pain with breathing from coughing, worse in the morning until around noon, not constant chest pain. He says his wife is going to bring home a COVID/FLU test. He says he could come into the office tomorrow. He says he can see whoever, it doesn't matter. Scheduled tomorrow.   1. ONSET: When did the cough begin?      Last week 2. SEVERITY: How bad is the cough today?      9 3. SPUTUM: Describe the color of your sputum (e.g., none, dry cough; clear, white, yellow, green)     Dull green 4. HEMOPTYSIS: Are you coughing up any blood? If Yes, ask: How much? (e.g., flecks, streaks, tablespoons, etc.)     No 5. DIFFICULTY BREATHING: Are you having difficulty breathing? If Yes, ask: How bad is it? (e.g., mild, moderate, severe)      Yes with movement/activity, coughing 6. FEVER: Do you have a fever? If Yes, ask: What is your temperature, how was it  measured, and when did it start?     Not really, wife stated 73 yesterday 7. CARDIAC HISTORY: Do you have any history of heart disease? (e.g., heart attack, congestive heart failure)      No 8. LUNG HISTORY: Do you have any history of lung disease?  (e.g., pulmonary embolus, asthma, emphysema)     Yes-asthma  9. OTHER SYMPTOMS: Do you have any other symptoms? (e.g., runny nose, wheezing, chest pain)       Generalized pain all over, fatigue and weakness worse than usual, headache, a little wheezing, runny nose some  Protocols used: Cough - Acute Productive-A-AH

## 2024-04-03 ENCOUNTER — Encounter: Payer: Self-pay | Admitting: Nurse Practitioner

## 2024-04-03 ENCOUNTER — Other Ambulatory Visit (HOSPITAL_COMMUNITY): Payer: Self-pay

## 2024-04-03 ENCOUNTER — Ambulatory Visit: Admitting: Nurse Practitioner

## 2024-04-03 VITALS — BP 122/68 | HR 80 | Temp 98.0°F | Ht 69.0 in | Wt 169.0 lb

## 2024-04-03 DIAGNOSIS — J4541 Moderate persistent asthma with (acute) exacerbation: Secondary | ICD-10-CM | POA: Diagnosis not present

## 2024-04-03 DIAGNOSIS — J069 Acute upper respiratory infection, unspecified: Secondary | ICD-10-CM

## 2024-04-03 LAB — POCT INFLUENZA A/B
Influenza A, POC: NEGATIVE
Influenza B, POC: NEGATIVE

## 2024-04-03 MED ORDER — PREDNISONE 10 MG PO TABS
ORAL_TABLET | ORAL | 0 refills | Status: DC
  Filled 2024-04-03: qty 20, 8d supply, fill #0

## 2024-04-03 MED ORDER — PROMETHAZINE-DM 6.25-15 MG/5ML PO SYRP
5.0000 mL | ORAL_SOLUTION | Freq: Four times a day (QID) | ORAL | 0 refills | Status: DC | PRN
  Filled 2024-04-03: qty 180, 9d supply, fill #0

## 2024-04-03 NOTE — Patient Instructions (Addendum)
 Continue Trelegy 1 puff daily. Brush tongue and rinse mouth afterwards Continue Albuterol  inhaler 2 puffs or duoneb 3 mL neb every 6 hours as needed for shortness of breath or wheezing. Notify if symptoms persist despite rescue inhaler/neb use. Continue Zyrtec 10 mg daily Continue flonase  nasal spray 2 sprays each nostril Continue protonix  40 mg daily  Ocean nasal spray 2-3 times a An as needed for nasal congestion/drainage  Tessalon  perles (benzonatate ) 1 capsule Three times a [place] as needed for cough Promethazine  DM cough syrup 5 mL every 8 hours as needed for cough. Do not drive after taking. May cause drowsiness  Prednisone  taper. 4 tabs for 2 days, then 3 tabs for 2 days, 2 tabs for 2 days, then 1 tab for 2 days, then stop. Take in AM with food  Guaifenesin  600 mg Twice daily  Rotate tylenol  and ibuprofen  over the counter as needed for headaches/body aches/fevers  If you develop new fevers or worsening symptoms, please call us   COVID test when you leave here and notify if positive    Follow up in 2 weeks with Dr. Jude or Izetta Malachy PIETY. If symptoms do not improve or worsen, please contact office for sooner follow up or seek emergency care

## 2024-04-03 NOTE — Progress Notes (Signed)
 @Patient  ID: James [name], male    DOB: [birth year], 68 y.o.   MRN: [id number]  Chief Complaint  Patient presents with   Cough    Productive cough (green), body aches, body chills, headaches & SOB during exertion x5days    Referring provider: Nichole Senior, MD  HPI: 68 year old male, former smoker followed for moderate persistent asthma.  He is a patient of Dr. Maryalice and last seen in office 02/23/2024 by Dr. Darlean.  He is also followed by lung cancer screening program.  Past medical history significant for anxiety, CAD, depression, DM type I, HLD, PVD, allergic rhinitis, GERD, BPH.  TEST/EVENTS:  09/18/2018 PFTs: FVC 74, FEV1 75, ratio 76; no significant BD 07/15/2021 CT chest lung cancer screening: Atherosclerosis is present.  There is mild centrilobular emphysema with diffuse bronchial wall thickening.  Lung RADS 2.  No acute process. 07/18/2023 LDCT chest: nodules stable. Scattered areas of chronic atelectasis or linear scarring b/l. Lung RADS 2  02/23/2024: OV with Dr. Darlean. Very sedentary x several years. Rattling cough/not using mucinex  or flutter valve. Maintained on Trelegy. SOB with exertion. Cough with clear mucus. On doxy and clindamycin  recently. Last dose of abx today. Been on prednisone  as well. Mucinex  bid. Continue GERD management.   04/03/2024: Today - acute Discussed the use of AI scribe software for clinical note transcription with the patient, who gave verbal consent to proceed.  History of Present Illness James [name] is a 68 year old male with asthma and emphysema who presents with cough, fatigue, and shortness of breath.  He has been experiencing productive cough with green phlegm, fatigue, and shortness of breath which is not significantly changed from baseline. He's experiencing pronounced fatigue. He also reports occasional chills and headaches. No documented fevers. No hemoptysis. He feels better today than he did yesterday.   He recently traveled to [place]  from  Thursday through Sunday, which may have exposed him to sick exposures. He has not yet taken a COVID test at home but plans to do so after the visit.  He has been using over-the-counter medications, including Tylenol  and ibuprofen .   He's used his albuterol  a few times since being sick. Still using his Trelegy daily. Eating and drinking well. No low oxygen  levels.     Allergies  Allergen Reactions   Augmentin  [Amoxicillin -Pot Clavulanate] Nausea And Vomiting and Other (See Comments)    Did it involve swelling of the face/tongue/throat, SOB, or low BP? No Did it involve sudden or severe rash/hives, skin peeling, or any reaction on the inside of your mouth or nose? No Did you need to seek medical attention at a hospital or doctor's office? No When did it last happen?      5+ years If all above answers are "NO", may proceed with cephalosporin use.    Lexapro [Escitalopram Oxalate] Itching    Immunization History  Administered Date(s) Administered   Fluad Quad(high Dose 65+) 04/15/2022   Influenza Split 04/19/2011, 05/22/2012, 04/17/2013, 04/18/2017, 03/28/2019   Influenza Whole 07/31/2007, 04/15/2009, 05/08/2010   Influenza, Quadrivalent, Recombinant, Inj, Pf 03/22/2018, 04/28/2020, 03/25/2021   Influenza,inj,Quad PF,6+ Mos 03/19/2015, 05/19/2016   Influenza-Unspecified 03/19/2015, 04/12/2017   PFIZER(Purple Top)SARS-COV-2 Vaccination 09/21/2019, 10/12/2019, 04/13/2020, 10/24/2020, 03/25/2021   PNEUMOCOCCAL CONJUGATE-20 05/01/2023   Pfizer Covid-19 Vaccine Bivalent Booster 68yrs & up 04/09/2021, 12/28/2021   Pneumococcal Polysaccharide-23 04/19/2003, 09/24/2011, 04/11/2019   Respiratory Syncytial Virus Vaccine ,Recomb Aduvanted(Arexvy ) 06/04/2022   Td 12/22/2000   Tdap 09/24/2011, 08/19/2013   Zoster Recombinant(Shingrix) 09/27/2019, 12/20/2019  Zoster, Live 09/27/2019, 12/20/2019    Past Medical History:  Diagnosis Date   Anxiety    Asthma    ASTHMATIC BRONCHITIS, ACUTE  10/25/2008   Bladder neck obstruction    CARPAL TUNNEL SYNDROME, BILATERAL 07/31/2007   issues resolved, no surgery   Cervical disc disease    CORONARY ARTERY DISEASE 04/03/2007   Depression    DIABETES MELLITUS, TYPE I 04/03/2007   Diabetic retinopathy associated with diabetes mellitus due to underlying condition (HCC) 04/03/2007   DM W/EYE MANIFESTATIONS, TYPE I, UNCONTROLLED 04/04/2007   DM W/RENAL MNFST, TYPE I, UNCONTROLLED 04/04/2007   ED (erectile dysfunction)    History of kidney stones    HYPERLIPIDEMIA 04/04/2007   Hypothyroidism    Pneumonia    several times and again today (02/13/2104)   Renal insufficiency    Seizures (HCC)    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; none in the last couple years (02/12/2014)   Spinal stenosis     Tobacco History: Social History   Tobacco Use  Smoking Status Former   Current packs/Ramsay: 0.00   Average packs/Errickson: 1 pack/Rosencrans for 40.0 years (40.0 ttl pk-yrs)   Types: Cigarettes   Start date: 08/20/1979   Quit date: 08/20/2019   Years since quitting: 4.6   Passive exposure: Past  Smokeless Tobacco Never  Tobacco Comments   Successfully quit smoking 08/20/2019   Counseling given: Not Answered Tobacco comments: Successfully quit smoking 08/20/2019   Outpatient Medications Prior to Visit  Medication Sig Dispense Refill   acetaminophen  (TYLENOL ) 500 MG tablet Take 1,000 mg by mouth every 6 (six) hours as needed for mild pain or headache.     albuterol  (PROVENTIL ) (2.5 MG/3ML) 0.083% nebulizer solution Inhale 3 mLs by nebulization every 6 (six) hours as needed for wheezing or shortness of breath. 180 mL 3   albuterol  (VENTOLIN  HFA) 108 (90 Base) MCG/ACT inhaler INHALE 2 PUFFS BY MOUTH INTO THE LUNGS EVERY 6 HOURS AS NEEDED FOR WHEEZING OR SHORTNESS OF BREATH. 6.7 g 2   aspirin  EC 81 MG tablet Take 1 tablet (81 mg total) by mouth daily. Swallow whole. 30 tablet 11   cetirizine (ZYRTEC) 10 MG tablet Take 10 mg by mouth at bedtime.      Continuous Blood Gluc Sensor (FREESTYLE LIBRE 3 SENSOR) MISC Change sensor every 14 days to monitor blood glucose continously 2 each 6   Continuous Glucose Sensor (FREESTYLE LIBRE 3 PLUS SENSOR) MISC Change every 15 days to monitor blood glucose continously 2 each 11   finasteride  (PROSCAR ) 5 MG tablet Take 1 tablet (5 mg total) by mouth daily. 90 tablet 3   Fluticasone -Umeclidin-Vilant (TRELEGY ELLIPTA ) 200-62.5-25 MCG/ACT AEPB Inhale 1 puff into the lungs daily at 2 PM.     Fluticasone -Umeclidin-Vilant (TRELEGY ELLIPTA ) 200-62.5-25 MCG/ACT AEPB Inhale 1 puff into the lungs daily.     Glucagon  (GVOKE HYPOPEN  2-PACK) 1 MG/0.2ML SOAJ Inject under the skin as directed for severe hypoglycemia. 0.4 mL 5   Glucagon  (GVOKE HYPOPEN  2-PACK) 1 MG/0.2ML SOAJ Use as directed under the skin for severe hypoglycemia. 0.4 mL 5   glucose blood (CONTOUR NEXT TEST) test strip USE 8 TIMES DAILY AS DIRECTED TO MONITOR BLOOD GLUCOSE 600 strip 4   HYDROcodone -acetaminophen  (NORCO/VICODIN) 5-325 MG tablet Take 1 tablet by mouth 3 (three) times daily as needed. 60 tablet 0   ibuprofen  (ADVIL ) 200 MG tablet Take 800 mg by mouth every 8 (eight) hours as needed (pain.).     insulin  aspart (FIASP  FLEXTOUCH) 100 UNIT/ML  FlexTouch Pen INJECT UP TO 100 UNITS A Conlee (ICR 20, ISF 20, target 120) (Patient taking differently: Inject 3 Units into the skin once.) 90 mL 3   insulin  aspart (FIASP  FLEXTOUCH) 100 UNIT/ML FlexTouch Pen INJECT UP TO 100 UNITS A Jeffords (ICR 20, ISF 20, target 120) 90 mL 3   insulin  aspart (NOVOLOG  FLEXPEN) 100 UNIT/ML FlexPen Inject up to 100 units subcutaneously per Arey as directed (ICR 20, ISF 20, target 120) 30 pens for 90 Laski Dx E10.3593 90 mL 5   insulin  aspart (NOVOLOG  FLEXPEN) 100 UNIT/ML FlexPen Inject up to 100 units subcutaneously per Hovsepian as directed (ICR 20, ISF 20, target 120) 90 mL 3   insulin  degludec (TRESIBA  FLEXTOUCH) 100 UNIT/ML FlexTouch Pen Inject 40 Units into the skin daily. 12 mL 11   Insulin   Degludec FlexTouch 100 UNIT/ML SOPN Inject 25 Units into the skin daily. 24 mL 3   insulin  glargine (LANTUS  SOLOSTAR) 100 UNIT/ML Solostar Pen Inject 25-30 Units into the skin daily as directed 45 mL 3   insulin  lispro (HUMALOG  KWIKPEN) 100 UNIT/ML KwikPen INJECT UP TO 100 UNITS A [place] UNDER THE SKIN AS DIRECTED 45 mL 5   Insulin  Pen Needle (TECHLITE PEN NEEDLES) 31G X 8 MM MISC Use 1 pen needle 4-5 times daily as directed. 500 each 3   Insulin  Pen Needle 31G X 8 MM MISC Use 1 pen needle subcutaneously 4 - 5 times daily as directed. 500 each 3   Insulin  Syringe-Needle U-100 31G X 5/16 0.3 ML MISC use to inject insulin  6 to 8 times daily 750 each 3   levothyroxine  (SYNTHROID ) 100 MCG tablet Take 1 tablet (100 mcg total) by mouth daily. 90 tablet 4   metoCLOPramide  (REGLAN ) 5 MG tablet Take 1 tablet (5 mg total) by mouth 2 (two) times daily before a meal. 180 tablet 4   pantoprazole  (PROTONIX ) 40 MG tablet Take 1 tablet by mouth daily as directed. 90 tablet 3   promethazine  (PHENERGAN ) 25 MG tablet Take 1 tablet (25 mg total) by mouth 3 (three) times daily as needed. 90 tablet 1   simvastatin  (ZOCOR ) 40 MG tablet Take 1 tablet (40 mg total) by mouth daily. 90 tablet 3   tamsulosin  (FLOMAX ) 0.4 MG CAPS capsule Take 1 capsule (0.4 mg total) by mouth daily. 90 capsule 3   tamsulosin  (FLOMAX ) 0.4 MG CAPS capsule Take 1 capsule (0.4 mg total) by mouth daily. 90 capsule 3   traZODone  (DESYREL ) 150 MG tablet Take 1 tablet (150 mg total) by mouth at bedtime as needed for sleep 90 tablet 4   varenicline  (CHANTIX ) 1 MG tablet Take 1 mg by mouth daily.     No facility-administered medications prior to visit.     Review of Systems: as above    Physical Exam:  BP 122/68   Pulse 80   Temp 98 F (36.7 C)   Ht 5' 9 (1.753 m)   Wt 169 lb (76.7 kg)   SpO2 93%   BMI 24.96 kg/m   GEN: Pleasant, interactive, well-kempt; in no acute distress. HEENT:  Normocephalic and atraumatic. PERRLA. Sclera white.  Nasal turbinates erythematous, moist and patent bilaterally. No rhinorrhea present. Oropharynx pink and moist, without exudate or edema. No lesions, ulcerations NECK:  Supple w/ fair ROM. No lymphadenopathy.   CV: RRR, no m/r/g, no peripheral edema. Pulses intact, +2 bilaterally. No cyanosis, pallor or clubbing. PULMONARY:  Unlabored, regular breathing. Scattered wheeze with coughing spells otherwise clear b/l A&P. Bronchitic cough.  No accessory muscle use. No dullness to percussion. GI: BS present and normoactive. Soft, non-tender to palpation. No organomegaly or masses detected.  MSK: No erythema, warmth or tenderness. Cap refil <2 sec all extrem.  Neuro: A/Ox3. No focal deficits noted.   Skin: Warm, no lesions or rashe Psych: Normal affect and behavior. Judgement and thought content appropriate.     Lab Results:  CBC    Component Value Date/Time   WBC 5.7 02/23/2024 0824   RBC 4.55 02/23/2024 0824   HGB 14.2 02/23/2024 0824   HGB 14.9 07/31/2021 1120   HCT 42.4 02/23/2024 0824   HCT 42.3 07/31/2021 1120   PLT 259 02/23/2024 0824   PLT 266 04/22/2020 0847   MCV 93.2 02/23/2024 0824   MCV 91 04/22/2020 0847   MCH 31.2 02/23/2024 0824   MCHC 33.5 02/23/2024 0824   RDW 12.5 02/23/2024 0824   RDW 12.7 04/22/2020 0847   LYMPHSABS 1.7 02/23/2024 0824   MONOABS 0.6 02/23/2024 0824   EOSABS 0.6 (H) 02/23/2024 0824   BASOSABS 0.1 02/23/2024 0824    BMET    Component Value Date/Time   NA 136 02/23/2024 0824   NA 137 07/31/2021 1120   K 4.1 02/23/2024 0824   CL 103 02/23/2024 0824   CO2 26 02/23/2024 0824   GLUCOSE 138 (H) 02/23/2024 0824   BUN 19 02/23/2024 0824   BUN 17 07/31/2021 1120   CREATININE 0.98 02/23/2024 0824   CALCIUM 8.6 (L) 02/23/2024 0824   GFRNONAA >60 02/23/2024 0824   GFRAA >60 12/02/2019 0912    BNP    Component Value Date/Time   BNP 48.7 06/30/2019 0619     Imaging:  No results found.  Administration History     None          Latest  Ref Rng & Units 09/18/2018   10:21 AM 05/22/2015   10:01 AM  PFT Results  FVC-Pre L 3.17  3.41   FVC-Predicted Pre % 70  74   FVC-Post L 3.35  3.28   FVC-Predicted Post % 74  71   Pre FEV1/FVC % % 75  74   Post FEV1/FCV % % 76  77   FEV1-Pre L 2.39  2.52   FEV1-Predicted Pre % 70  72   FEV1-Post L 2.56  2.53   DLCO uncorrected ml/min/mmHg  22.74   DLCO UNC% %  73   DLVA Predicted %  97   TLC L  5.50   TLC % Predicted %  80   RV % Predicted %  113     Lab Results  Component Value Date   NITRICOXIDE 11 01/26/2017        Assessment & Plan:   Assessment & Plan Acute viral respiratory infection  Suspected acute viral respiratory infection. Unable to COVID test in office; advised to obtain home test and notify if positive so antiviral can be prescribed. Negative flu test. No known exposure but recent travel to [place] . No current indication for antibiotics as symptoms suggest viral etiology. Supportive care measures. Advised viral symptoms typically last 7-10 days. Aware to notify if he fails to improve, to contact us . Hold off on imaging today; obtain if symptoms fail to improve or worsen.  - Order COVID-19 test - Instruct to alternate acetaminophen  and ibuprofen  for symptom relief - Encourage rest and hydration - Advise use of guaifenesin  to thin mucus - Instruct to call if COVID-19 test is positive for antiviral prescription - Prescribe cough syrup and  cough pearls - Advise not to drive after taking prescribed cough syrup  Asthmatic bronchitis  Asthma with baseline shortness of breath. Wheezing noted upon coughing; otherwise, lung exam clear. Mild bronchitic exacerbation. Will treat with prednisone  taper and cough control measures. See above. Encouraged continue bronchodilator therapies. Action plan in place.  - Prescribe prednisone  - Advise use of nebulizer 2-3 times daily - Continue Trelegy daily     I spent 35 minutes of dedicated to the care of this patient on the  date of this encounter to include pre-visit review of records, face-to-face time with the patient discussing conditions above, post visit ordering of testing, clinical documentation with the electronic health record, making appropriate referrals as documented, and communicating necessary findings to members of the patients care team.  Comer LULLA Rouleau, NP 04/03/2024  Pt aware and understands NP's role.

## 2024-04-04 ENCOUNTER — Other Ambulatory Visit (HOSPITAL_COMMUNITY): Payer: Self-pay

## 2024-04-10 ENCOUNTER — Emergency Department (HOSPITAL_COMMUNITY)

## 2024-04-10 ENCOUNTER — Other Ambulatory Visit: Payer: Self-pay

## 2024-04-10 ENCOUNTER — Encounter (HOSPITAL_COMMUNITY): Payer: Self-pay | Admitting: Emergency Medicine

## 2024-04-10 ENCOUNTER — Inpatient Hospital Stay (HOSPITAL_COMMUNITY)
Admission: EM | Admit: 2024-04-10 | Discharge: 2024-04-18 | DRG: 177 | Disposition: A | Discharge status: Home Health | Attending: Internal Medicine | Admitting: Internal Medicine

## 2024-04-10 ENCOUNTER — Other Ambulatory Visit (HOSPITAL_COMMUNITY): Payer: Self-pay

## 2024-04-10 DIAGNOSIS — E876 Hypokalemia: Secondary | ICD-10-CM | POA: Diagnosis present

## 2024-04-10 DIAGNOSIS — Z1152 Encounter for screening for COVID-19: Secondary | ICD-10-CM | POA: Diagnosis not present

## 2024-04-10 DIAGNOSIS — E10649 Type 1 diabetes mellitus with hypoglycemia without coma: Secondary | ICD-10-CM | POA: Diagnosis not present

## 2024-04-10 DIAGNOSIS — R338 Other retention of urine: Secondary | ICD-10-CM | POA: Diagnosis present

## 2024-04-10 DIAGNOSIS — J690 Pneumonitis due to inhalation of food and vomit: Principal | ICD-10-CM | POA: Diagnosis present

## 2024-04-10 DIAGNOSIS — K219 Gastro-esophageal reflux disease without esophagitis: Secondary | ICD-10-CM | POA: Diagnosis present

## 2024-04-10 DIAGNOSIS — D75839 Thrombocytosis, unspecified: Secondary | ICD-10-CM | POA: Diagnosis present

## 2024-04-10 DIAGNOSIS — Z8249 Family history of ischemic heart disease and other diseases of the circulatory system: Secondary | ICD-10-CM

## 2024-04-10 DIAGNOSIS — E109 Type 1 diabetes mellitus without complications: Secondary | ICD-10-CM | POA: Diagnosis present

## 2024-04-10 DIAGNOSIS — T380X5A Adverse effect of glucocorticoids and synthetic analogues, initial encounter: Secondary | ICD-10-CM | POA: Diagnosis not present

## 2024-04-10 DIAGNOSIS — Z7982 Long term (current) use of aspirin: Secondary | ICD-10-CM

## 2024-04-10 DIAGNOSIS — R1312 Dysphagia, oropharyngeal phase: Secondary | ICD-10-CM | POA: Diagnosis present

## 2024-04-10 DIAGNOSIS — Z23 Encounter for immunization: Secondary | ICD-10-CM

## 2024-04-10 DIAGNOSIS — E875 Hyperkalemia: Secondary | ICD-10-CM | POA: Diagnosis not present

## 2024-04-10 DIAGNOSIS — N200 Calculus of kidney: Secondary | ICD-10-CM | POA: Diagnosis present

## 2024-04-10 DIAGNOSIS — Z87891 Personal history of nicotine dependence: Secondary | ICD-10-CM

## 2024-04-10 DIAGNOSIS — R0902 Hypoxemia: Secondary | ICD-10-CM | POA: Diagnosis present

## 2024-04-10 DIAGNOSIS — J470 Bronchiectasis with acute lower respiratory infection: Secondary | ICD-10-CM | POA: Diagnosis present

## 2024-04-10 DIAGNOSIS — J471 Bronchiectasis with (acute) exacerbation: Secondary | ICD-10-CM | POA: Diagnosis present

## 2024-04-10 DIAGNOSIS — Z981 Arthrodesis status: Secondary | ICD-10-CM

## 2024-04-10 DIAGNOSIS — E1065 Type 1 diabetes mellitus with hyperglycemia: Secondary | ICD-10-CM | POA: Diagnosis present

## 2024-04-10 DIAGNOSIS — E1022 Type 1 diabetes mellitus with diabetic chronic kidney disease: Secondary | ICD-10-CM | POA: Diagnosis present

## 2024-04-10 DIAGNOSIS — E039 Hypothyroidism, unspecified: Secondary | ICD-10-CM | POA: Diagnosis present

## 2024-04-10 DIAGNOSIS — T17908A Unspecified foreign body in respiratory tract, part unspecified causing other injury, initial encounter: Secondary | ICD-10-CM | POA: Diagnosis not present

## 2024-04-10 DIAGNOSIS — F32A Depression, unspecified: Secondary | ICD-10-CM | POA: Diagnosis present

## 2024-04-10 DIAGNOSIS — J9601 Acute respiratory failure with hypoxia: Secondary | ICD-10-CM | POA: Diagnosis present

## 2024-04-10 DIAGNOSIS — Z7951 Long term (current) use of inhaled steroids: Secondary | ICD-10-CM

## 2024-04-10 DIAGNOSIS — R042 Hemoptysis: Secondary | ICD-10-CM | POA: Diagnosis not present

## 2024-04-10 DIAGNOSIS — J069 Acute upper respiratory infection, unspecified: Secondary | ICD-10-CM

## 2024-04-10 DIAGNOSIS — J441 Chronic obstructive pulmonary disease with (acute) exacerbation: Secondary | ICD-10-CM | POA: Diagnosis present

## 2024-04-10 DIAGNOSIS — Z8701 Personal history of pneumonia (recurrent): Secondary | ICD-10-CM

## 2024-04-10 DIAGNOSIS — J449 Chronic obstructive pulmonary disease, unspecified: Secondary | ICD-10-CM | POA: Diagnosis present

## 2024-04-10 DIAGNOSIS — R000 Tachycardia, unspecified: Secondary | ICD-10-CM | POA: Diagnosis present

## 2024-04-10 DIAGNOSIS — R7989 Other specified abnormal findings of blood chemistry: Secondary | ICD-10-CM | POA: Diagnosis present

## 2024-04-10 DIAGNOSIS — Z833 Family history of diabetes mellitus: Secondary | ICD-10-CM

## 2024-04-10 DIAGNOSIS — Z794 Long term (current) use of insulin: Secondary | ICD-10-CM

## 2024-04-10 DIAGNOSIS — F339 Major depressive disorder, recurrent, unspecified: Secondary | ICD-10-CM | POA: Diagnosis present

## 2024-04-10 DIAGNOSIS — Z79899 Other long term (current) drug therapy: Secondary | ICD-10-CM

## 2024-04-10 DIAGNOSIS — I5189 Other ill-defined heart diseases: Secondary | ICD-10-CM

## 2024-04-10 DIAGNOSIS — Z79890 Hormone replacement therapy (postmenopausal): Secondary | ICD-10-CM

## 2024-04-10 DIAGNOSIS — G4700 Insomnia, unspecified: Secondary | ICD-10-CM | POA: Diagnosis present

## 2024-04-10 DIAGNOSIS — I2510 Atherosclerotic heart disease of native coronary artery without angina pectoris: Secondary | ICD-10-CM | POA: Diagnosis present

## 2024-04-10 DIAGNOSIS — Z961 Presence of intraocular lens: Secondary | ICD-10-CM | POA: Diagnosis present

## 2024-04-10 DIAGNOSIS — J440 Chronic obstructive pulmonary disease with acute lower respiratory infection: Secondary | ICD-10-CM | POA: Diagnosis present

## 2024-04-10 DIAGNOSIS — F419 Anxiety disorder, unspecified: Secondary | ICD-10-CM | POA: Diagnosis present

## 2024-04-10 DIAGNOSIS — E785 Hyperlipidemia, unspecified: Secondary | ICD-10-CM | POA: Diagnosis present

## 2024-04-10 DIAGNOSIS — Z881 Allergy status to other antibiotic agents status: Secondary | ICD-10-CM

## 2024-04-10 DIAGNOSIS — N401 Enlarged prostate with lower urinary tract symptoms: Secondary | ICD-10-CM | POA: Diagnosis present

## 2024-04-10 DIAGNOSIS — J189 Pneumonia, unspecified organism: Principal | ICD-10-CM | POA: Diagnosis present

## 2024-04-10 DIAGNOSIS — Z888 Allergy status to other drugs, medicaments and biological substances status: Secondary | ICD-10-CM

## 2024-04-10 LAB — BASIC METABOLIC PANEL WITH GFR
Anion gap: 17 — ABNORMAL HIGH (ref 5–15)
BUN: 14 mg/dL (ref 8–23)
CO2: 20 mmol/L — ABNORMAL LOW (ref 22–32)
Calcium: 8.9 mg/dL (ref 8.9–10.3)
Chloride: 98 mmol/L (ref 98–111)
Creatinine, Ser: 0.8 mg/dL (ref 0.61–1.24)
GFR, Estimated: >60 mL/min (ref >60)
Glucose, Bld: 253 mg/dL — ABNORMAL HIGH (ref 70–99)
Potassium: 3.3 mmol/L — ABNORMAL LOW (ref 3.5–5.1)
Sodium: 134 mmol/L — ABNORMAL LOW (ref 135–145)

## 2024-04-10 LAB — CBC WITH DIFFERENTIAL/PLATELET
Abs Immature Granulocytes: 0.02 K/uL (ref 0.00–0.07)
Basophils Absolute: 0.1 K/uL (ref 0.0–0.1)
Basophils Relative: 1 %
Eosinophils Absolute: 0.4 K/uL (ref 0.0–0.5)
Eosinophils Relative: 5 %
HCT: 39.3 % (ref 39.0–52.0)
Hemoglobin: 13 g/dL (ref 13.0–17.0)
Immature Granulocytes: 0 %
Lymphocytes Relative: 15 %
Lymphs Abs: 1.3 K/uL (ref 0.7–4.0)
MCH: 29.8 pg (ref 26.0–34.0)
MCHC: 33.1 g/dL (ref 30.0–36.0)
MCV: 90.1 fL (ref 80.0–100.0)
Monocytes Absolute: 0.7 K/uL (ref 0.1–1.0)
Monocytes Relative: 9 %
Neutro Abs: 5.8 K/uL (ref 1.7–7.7)
Neutrophils Relative %: 70 %
Platelets: 431 K/uL — ABNORMAL HIGH (ref 150–400)
RBC: 4.36 MIL/uL (ref 4.22–5.81)
RDW: 11.8 % (ref 11.5–15.5)
WBC: 8.3 K/uL (ref 4.0–10.5)
nRBC: 0 % (ref 0.0–0.2)

## 2024-04-10 LAB — RESPIRATORY PANEL BY PCR

## 2024-04-10 LAB — MAGNESIUM: Magnesium: 2 mg/dL (ref 1.7–2.4)

## 2024-04-10 LAB — RESP PANEL BY RT-PCR (RSV, FLU A&B, COVID)  RVPGX2
Influenza A by PCR: NEGATIVE
Influenza B by PCR: NEGATIVE
Resp Syncytial Virus by PCR: NEGATIVE
SARS Coronavirus 2 by RT PCR: NEGATIVE

## 2024-04-10 LAB — GLUCOSE, CAPILLARY
Glucose-Capillary: 290 mg/dL — ABNORMAL HIGH (ref 70–99)
Glucose-Capillary: 251 mg/dL — ABNORMAL HIGH (ref 70–99)
Glucose-Capillary: 228 mg/dL — ABNORMAL HIGH (ref 70–99)

## 2024-04-10 LAB — PHOSPHORUS: Phosphorus: 2 mg/dL — ABNORMAL LOW (ref 2.5–4.6)

## 2024-04-10 LAB — MRSA NEXT GEN BY PCR, NASAL: MRSA by PCR Next Gen: NOT DETECTED

## 2024-04-10 LAB — D-DIMER, QUANTITATIVE: D-Dimer, Quant: 0.81 ug{FEU}/mL — ABNORMAL HIGH (ref 0.00–0.50)

## 2024-04-10 MED ORDER — ALBUTEROL SULFATE (2.5 MG/3ML) 0.083% IN NEBU: 2.5000 mg | INHALATION_SOLUTION | RESPIRATORY_TRACT | Status: DC | PRN

## 2024-04-10 MED ORDER — SODIUM CHLORIDE 0.9 % IV SOLN
500.0000 mg | INTRAVENOUS | Status: DC
  Administered 2024-04-11 – 2024-04-12 (×2): 500 mg via INTRAVENOUS
  Filled 2024-04-10 (×3): qty 5

## 2024-04-10 MED ORDER — SODIUM CHLORIDE 0.9 % IV SOLN
1.0000 g | Freq: Once | INTRAVENOUS | Status: AC
  Administered 2024-04-10: 1 g via INTRAVENOUS
  Filled 2024-04-10: qty 10

## 2024-04-10 MED ORDER — INSULIN GLARGINE 100 UNIT/ML ~~LOC~~ SOLN
20.0000 [IU] | Freq: Every day | SUBCUTANEOUS | Status: DC
  Filled 2024-04-10: qty 0.2

## 2024-04-10 MED ORDER — IPRATROPIUM-ALBUTEROL 0.5-2.5 (3) MG/3ML IN SOLN
3.0000 mL | RESPIRATORY_TRACT | Status: DC | PRN
  Administered 2024-04-11 – 2024-04-13 (×3): 3 mL via RESPIRATORY_TRACT
  Filled 2024-04-10 (×3): qty 3

## 2024-04-10 MED ORDER — SIMVASTATIN 20 MG PO TABS
40.0000 mg | ORAL_TABLET | Freq: Every day | ORAL | Status: DC
  Administered 2024-04-11 – 2024-04-18 (×8): 40 mg via ORAL
  Filled 2024-04-10 (×3): qty 1
  Filled 2024-04-10: qty 2
  Filled 2024-04-10 (×4): qty 1

## 2024-04-10 MED ORDER — IPRATROPIUM-ALBUTEROL 0.5-2.5 (3) MG/3ML IN SOLN
3.0000 mL | Freq: Three times a day (TID) | RESPIRATORY_TRACT | Status: DC
  Administered 2024-04-10: 3 mL via RESPIRATORY_TRACT
  Filled 2024-04-10 (×2): qty 3

## 2024-04-10 MED ORDER — BUDESONIDE 0.5 MG/2ML IN SUSP
0.5000 mg | Freq: Two times a day (BID) | RESPIRATORY_TRACT | Status: DC
  Administered 2024-04-10 – 2024-04-18 (×16): 0.5 mg via RESPIRATORY_TRACT
  Filled 2024-04-10 (×16): qty 2

## 2024-04-10 MED ORDER — POTASSIUM CHLORIDE CRYS ER 20 MEQ PO TBCR
40.0000 meq | EXTENDED_RELEASE_TABLET | Freq: Once | ORAL | Status: AC
  Administered 2024-04-10: 40 meq via ORAL
  Filled 2024-04-10: qty 2

## 2024-04-10 MED ORDER — INSULIN ASPART 100 UNIT/ML IJ SOLN
0.0000 [IU] | Freq: Three times a day (TID) | INTRAMUSCULAR | Status: DC
  Administered 2024-04-10 (×2): 11 [IU] via SUBCUTANEOUS
  Administered 2024-04-11 (×2): 4 [IU] via SUBCUTANEOUS
  Administered 2024-04-11: 11 [IU] via SUBCUTANEOUS
  Administered 2024-04-12 (×2): 3 [IU] via SUBCUTANEOUS
  Filled 2024-04-10: qty 0.2

## 2024-04-10 MED ORDER — IOHEXOL 350 MG/ML SOLN
75.0000 mL | Freq: Once | INTRAVENOUS | Status: AC | PRN
  Administered 2024-04-10: 75 mL via INTRAVENOUS

## 2024-04-10 MED ORDER — HYDROCODONE-ACETAMINOPHEN 5-325 MG PO TABS
1.0000 | ORAL_TABLET | Freq: Three times a day (TID) | ORAL | Status: DC | PRN
  Administered 2024-04-10 – 2024-04-13 (×6): 1 via ORAL
  Filled 2024-04-10 (×7): qty 1

## 2024-04-10 MED ORDER — PROMETHAZINE HCL 6.25 MG/5ML PO SOLN
6.2500 mg | Freq: Four times a day (QID) | ORAL | Status: DC | PRN
  Administered 2024-04-11 (×2): 6.25 mg via ORAL
  Filled 2024-04-10 (×4): qty 5

## 2024-04-10 MED ORDER — INSULIN ASPART 100 UNIT/ML IJ SOLN
0.0000 [IU] | Freq: Every day | INTRAMUSCULAR | Status: DC
  Administered 2024-04-10: 2 [IU] via SUBCUTANEOUS
  Filled 2024-04-10: qty 0.05

## 2024-04-10 MED ORDER — SODIUM CHLORIDE 0.9 % IV SOLN
500.0000 mg | Freq: Once | INTRAVENOUS | Status: AC
  Administered 2024-04-10: 500 mg via INTRAVENOUS
  Filled 2024-04-10: qty 5

## 2024-04-10 MED ORDER — ONDANSETRON HCL 4 MG/2ML IJ SOLN
4.0000 mg | Freq: Four times a day (QID) | INTRAMUSCULAR | Status: DC | PRN
  Administered 2024-04-15 – 2024-04-18 (×2): 4 mg via INTRAVENOUS
  Filled 2024-04-10: qty 2

## 2024-04-10 MED ORDER — IPRATROPIUM-ALBUTEROL 0.5-2.5 (3) MG/3ML IN SOLN
3.0000 mL | Freq: Once | RESPIRATORY_TRACT | Status: AC
  Administered 2024-04-10: 3 mL via RESPIRATORY_TRACT
  Filled 2024-04-10: qty 3

## 2024-04-10 MED ORDER — REVEFENACIN 175 MCG/3ML IN SOLN
175.0000 ug | Freq: Every day | RESPIRATORY_TRACT | Status: DC
  Administered 2024-04-13 – 2024-04-18 (×6): 175 ug via RESPIRATORY_TRACT
  Filled 2024-04-10 (×6): qty 3

## 2024-04-10 MED ORDER — TAMSULOSIN HCL 0.4 MG PO CAPS
0.4000 mg | ORAL_CAPSULE | Freq: Every day | ORAL | Status: DC
Start: 2024-04-10 — End: 2024-04-15
  Administered 2024-04-10 – 2024-04-14 (×5): 0.4 mg via ORAL
  Filled 2024-04-10 (×5): qty 1

## 2024-04-10 MED ORDER — CHLORHEXIDINE GLUCONATE CLOTH 2 % EX PADS
6.0000 | MEDICATED_PAD | Freq: Every day | CUTANEOUS | Status: DC
  Administered 2024-04-10 – 2024-04-18 (×7): 6 via TOPICAL

## 2024-04-10 MED ORDER — FINASTERIDE 5 MG PO TABS
5.0000 mg | ORAL_TABLET | Freq: Every day | ORAL | Status: DC
  Administered 2024-04-10 – 2024-04-17 (×8): 5 mg via ORAL
  Filled 2024-04-10 (×8): qty 1

## 2024-04-10 MED ORDER — IPRATROPIUM-ALBUTEROL 0.5-2.5 (3) MG/3ML IN SOLN
3.0000 mL | Freq: Once | RESPIRATORY_TRACT | Status: AC
  Administered 2024-04-10: 3 mL via RESPIRATORY_TRACT
  Filled 2024-04-10: qty 9

## 2024-04-10 MED ORDER — PREDNISONE 20 MG PO TABS
40.0000 mg | ORAL_TABLET | Freq: Every day | ORAL | Status: AC
  Administered 2024-04-11 – 2024-04-15 (×5): 40 mg via ORAL
  Filled 2024-04-10 (×5): qty 2

## 2024-04-10 MED ORDER — METOCLOPRAMIDE HCL 5 MG PO TABS
5.0000 mg | ORAL_TABLET | Freq: Two times a day (BID) | ORAL | Status: DC
Start: 2024-04-11 — End: 2024-04-18
  Administered 2024-04-11 – 2024-04-18 (×15): 5 mg via ORAL
  Filled 2024-04-10 (×15): qty 1

## 2024-04-10 MED ORDER — SODIUM CHLORIDE 0.9 % IV SOLN: 1.0000 g | INTRAVENOUS | Status: DC

## 2024-04-10 MED ORDER — ENOXAPARIN SODIUM 40 MG/0.4ML IJ SOSY
40.0000 mg | PREFILLED_SYRINGE | INTRAMUSCULAR | Status: DC
  Administered 2024-04-10 – 2024-04-17 (×8): 40 mg via SUBCUTANEOUS
  Filled 2024-04-10 (×8): qty 0.4

## 2024-04-10 MED ORDER — ORAL CARE MOUTH RINSE: 15.0000 mL | OROMUCOSAL | Status: DC | PRN

## 2024-04-10 MED ORDER — METHYLPREDNISOLONE SODIUM SUCC 125 MG IJ SOLR: 125.0000 mg | Freq: Once | INTRAMUSCULAR | Status: DC

## 2024-04-10 MED ORDER — IPRATROPIUM-ALBUTEROL 0.5-2.5 (3) MG/3ML IN SOLN
3.0000 mL | Freq: Four times a day (QID) | RESPIRATORY_TRACT | Status: DC
  Administered 2024-04-10: 3 mL via RESPIRATORY_TRACT
  Filled 2024-04-10: qty 3

## 2024-04-10 MED ORDER — HYDROCODONE-ACETAMINOPHEN 5-325 MG PO TABS
1.0000 | ORAL_TABLET | Freq: Once | ORAL | Status: AC
  Administered 2024-04-10: 1 via ORAL
  Filled 2024-04-10: qty 1

## 2024-04-10 MED ORDER — ARFORMOTEROL TARTRATE 15 MCG/2ML IN NEBU
15.0000 ug | INHALATION_SOLUTION | Freq: Two times a day (BID) | RESPIRATORY_TRACT | Status: DC
Start: 2024-04-13 — End: 2024-04-18
  Administered 2024-04-13 – 2024-04-18 (×11): 15 ug via RESPIRATORY_TRACT
  Filled 2024-04-10 (×11): qty 2

## 2024-04-10 MED ORDER — ACETAMINOPHEN 325 MG PO TABS
650.0000 mg | ORAL_TABLET | Freq: Four times a day (QID) | ORAL | Status: DC | PRN
  Administered 2024-04-13: 650 mg via ORAL
  Filled 2024-04-10: qty 2

## 2024-04-10 MED ORDER — ASPIRIN 81 MG PO TBEC
81.0000 mg | DELAYED_RELEASE_TABLET | Freq: Every day | ORAL | Status: DC
  Administered 2024-04-10 – 2024-04-17 (×8): 81 mg via ORAL
  Filled 2024-04-10 (×8): qty 1

## 2024-04-10 MED ORDER — LEVOTHYROXINE SODIUM 100 MCG PO TABS
100.0000 ug | ORAL_TABLET | Freq: Every day | ORAL | Status: DC
  Administered 2024-04-10 – 2024-04-17 (×8): 100 ug via ORAL
  Filled 2024-04-10 (×8): qty 1

## 2024-04-10 MED ORDER — LACTATED RINGERS IV SOLN: INTRAVENOUS | Status: AC

## 2024-04-10 MED ORDER — ACETAMINOPHEN 650 MG RE SUPP: 650.0000 mg | Freq: Four times a day (QID) | RECTAL | Status: DC | PRN

## 2024-04-10 MED ORDER — K PHOS MONO-SOD PHOS DI & MONO 155-852-130 MG PO TABS
500.0000 mg | ORAL_TABLET | Freq: Four times a day (QID) | ORAL | Status: AC
  Administered 2024-04-10 – 2024-04-11 (×3): 500 mg via ORAL
  Filled 2024-04-10 (×3): qty 2

## 2024-04-10 MED ORDER — TRAZODONE HCL 50 MG PO TABS
150.0000 mg | ORAL_TABLET | Freq: Every day | ORAL | Status: DC
  Administered 2024-04-10: 150 mg via ORAL
  Filled 2024-04-10: qty 3

## 2024-04-10 MED ORDER — SODIUM CHLORIDE 0.9 % IV SOLN
2.0000 g | INTRAVENOUS | Status: DC
  Administered 2024-04-11: 2 g via INTRAVENOUS
  Filled 2024-04-10: qty 20

## 2024-04-10 MED ORDER — SODIUM CHLORIDE 3 % IN NEBU
4.0000 mL | INHALATION_SOLUTION | Freq: Three times a day (TID) | RESPIRATORY_TRACT | Status: DC
  Administered 2024-04-10: 4 mL via RESPIRATORY_TRACT
  Filled 2024-04-10 (×2): qty 4

## 2024-04-10 MED ORDER — ARFORMOTEROL TARTRATE 15 MCG/2ML IN NEBU: 15.0000 ug | INHALATION_SOLUTION | Freq: Two times a day (BID) | RESPIRATORY_TRACT | Status: DC

## 2024-04-10 MED ORDER — ONDANSETRON HCL 4 MG PO TABS
4.0000 mg | ORAL_TABLET | Freq: Four times a day (QID) | ORAL | Status: DC | PRN
  Administered 2024-04-14 – 2024-04-16 (×2): 4 mg via ORAL
  Filled 2024-04-10 (×2): qty 1

## 2024-04-10 MED ORDER — DEXTROMETHORPHAN POLISTIREX ER 30 MG/5ML PO SUER
15.0000 mg | Freq: Four times a day (QID) | ORAL | Status: DC | PRN
  Administered 2024-04-11 – 2024-04-12 (×2): 15 mg via ORAL
  Filled 2024-04-10 (×3): qty 5

## 2024-04-10 MED ORDER — LORATADINE 10 MG PO TABS
10.0000 mg | ORAL_TABLET | Freq: Every day | ORAL | Status: DC
Start: 2024-04-10 — End: 2024-04-18
  Administered 2024-04-10 – 2024-04-17 (×8): 10 mg via ORAL
  Filled 2024-04-10 (×8): qty 1

## 2024-04-10 MED ORDER — PROMETHAZINE-DM 6.25-15 MG/5ML PO SYRP: 5.0000 mL | ORAL_SOLUTION | Freq: Four times a day (QID) | ORAL | Status: DC | PRN

## 2024-04-10 MED ORDER — PANTOPRAZOLE SODIUM 40 MG PO TBEC
40.0000 mg | DELAYED_RELEASE_TABLET | Freq: Every day | ORAL | Status: DC
Start: 2024-04-10 — End: 2024-04-18
  Administered 2024-04-10 – 2024-04-17 (×8): 40 mg via ORAL
  Filled 2024-04-10 (×8): qty 1

## 2024-04-10 MED ORDER — GUAIFENESIN ER 600 MG PO TB12
600.0000 mg | ORAL_TABLET | Freq: Two times a day (BID) | ORAL | Status: DC
  Administered 2024-04-10: 600 mg via ORAL
  Filled 2024-04-10: qty 1

## 2024-04-10 MED ORDER — REVEFENACIN 175 MCG/3ML IN SOLN: 175.0000 ug | Freq: Every day | RESPIRATORY_TRACT | Status: DC

## 2024-04-10 NOTE — H&P (Signed)
 History and Physical    Patient: James [name] FMW:[id number] DOB: 11/06/1955 DOA: 04/10/2024 DOS: the patient was seen and examined on 04/10/2024 PCP: Nichole Senior, MD  Patient coming from: Home  Chief Complaint:  Chief Complaint  Patient presents with   Shortness of Breath   HPI: [name] is a 68 y.o. male with medical history significant of Anxiety, depression, asthma, COPD, history of bladder neck obstruction, cervical disc disease, CAD, type 1 diabetes, diabetic retinopathy, nephrolithiasis, hyperlipidemia, hypothyroidism, history of multiple episodes of pneumonia, CKD history of seizures hyperglycemia, spinal stenosis who presented to the emergency department complaints of progressively worse dyspnea associated with productive cough, pleuritic chest pain, wheezing, fatigue, malaise, myalgias, arthralgias and decreased appetite for the past week.  About 2 weeks ago, the patient started having symptoms so URI and went to his pulmonologist office.  He was given outpatient treatment without improvement of his symptoms.  This morning the patient had a coughing spell and became very dyspneic suddenly so he called EMS.  He tried to use his bronchodilators without significant results.  He denied fever, chills, rhinorrhea, sore throat or hemoptysis.  No palpitations, diaphoresis, PND, orthopnea or pitting edema of the lower extremities.  No abdominal pain, nausea, emesis, diarrhea, constipation, melena or hematochezia.  No flank pain, dysuria, frequency or hematuria.  No polyuria, polydipsia, polyphagia or blurred vision.   Lab work: CBC showed a white count of 8.3, hemoglobin 13.0 g/dL platelets 868.  D-dimer was 0.81.  Coronavirus, influenza and RSV PCR was negative.  BMP showed a potassium of 3.3, CO2 20 mmol/L with an anion gap of 17.  Glucose 253 mg/dL.  Normal sodium after correction.  Normal chloride, calcium and renal function.  Imaging: Portable chest radiograph showing right lower lobe  pneumonia with small parapneumonic effusion.  CTA chest with no segmental or large PE.  Middle and right lower lobe pulmonary consolidation, consistent with pneumonia.   ED course: Initial vital signs were temperature 97.9 F, pulse 7012, respirations 22, BP 154/64 mmHg O2 sat 83% on room air.  The patient received azithromycin  500 mg IVPB, ceftriaxone  1 g IVPB, hydrocodone  5/325 mg 1 tablet p.o. x 1, DuoNeb x 3, LR 100 mL/h.  KCl 40 mEq p.o. x 1 dose.  Review of Systems: As mentioned in the history of present illness. All other systems reviewed and are negative. Past Medical History:  Diagnosis Date   Anxiety    Asthma    ASTHMATIC BRONCHITIS, ACUTE 10/25/2008   Bladder neck obstruction    CARPAL TUNNEL SYNDROME, BILATERAL 07/31/2007   issues resolved, no surgery   Cervical disc disease    CORONARY ARTERY DISEASE 04/03/2007   Depression    DIABETES MELLITUS, TYPE I 04/03/2007   Diabetic retinopathy associated with diabetes mellitus due to underlying condition (HCC) 04/03/2007   DM W/EYE MANIFESTATIONS, TYPE I, UNCONTROLLED 04/04/2007   DM W/RENAL MNFST, TYPE I, UNCONTROLLED 04/04/2007   ED (erectile dysfunction)    History of kidney stones    HYPERLIPIDEMIA 04/04/2007   Hypothyroidism    Pneumonia    several times and again today (02/13/2104)   Renal insufficiency    Seizures (HCC)    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; none in the last couple years (02/12/2014)   Spinal stenosis    Past Surgical History:  Procedure Laterality Date   ANTERIOR CERVICAL DECOMP/DISCECTOMY FUSION  2000   couple screws and a plate   ANTERIOR CERVICAL DECOMP/DISCECTOMY FUSION N/A 06/24/2016   Procedure: ANTERIOR  CERVICAL DECOMPRESSION/DISCECTOMY FUSION CERVICAL FOUR - CERVICAL FIVE, CERVICAL FIVE - CERVICAL SIX; REMOVAL TETHER CERVICAL PLATE;  Surgeon: Lamar Peaches, MD;  Location: [facility] OR;  Service: Neurosurgery;  Laterality: N/A;  ANTERIOR CERVICAL DECOMPRESSION/DISCECTOMY FUSION CERVICAL FOUR -  CERVICAL FIVE, CERVICAL FIVE - CERVICAL SIX; REMOVAL TETHER CERVICAL PLATE   APPENDECTOMY     BACK SURGERY     CARDIAC CATHETERIZATION  1990's   CATARACT EXTRACTION W/ INTRAOCULAR LENS  IMPLANT, BILATERAL Bilateral    CYSTOSCOPY WITH RETROGRADE PYELOGRAM, URETEROSCOPY AND STENT PLACEMENT Bilateral 04/06/2013   Procedure: BILATERAL CYSTOSCOPY WITH RETROGRADE PYELOGRAMS, STENT PLACEMENTS AND LEFT URETEROSCOPY AND STONE REMOVAL;  Surgeon: Ricardo Likens, MD;  Location: [facility];  Service: Urology;  Laterality: Bilateral;   CYSTOSCOPY WITH STENT PLACEMENT Right 04/12/2013   Procedure: CYSTOSCOPY WITH STENT PLACEMENT;  Surgeon: Ricardo Likens, MD;  Location: [facility];  Service: Urology;  Laterality: Right;   CYSTOSCOPY/RETROGRADE/URETEROSCOPY Bilateral 04/12/2013   Procedure: CYSTOSCOPY/RETROGRADE/URETEROSCOPY;  Surgeon: Ricardo Likens, MD;  Location: [facility];  Service: Urology;  Laterality: Bilateral;  RIGHT RETROGRADE    EXCISION MASS LOWER EXTREMETIES Left 03/01/2024   Procedure: EXCISION MASS LOWER EXTREMITIES;  Surgeon: Teresa Lonni HERO, MD;  Location: [facility];  Service: General;  Laterality: Left;  REMOVAL OF LEFT INNER THIGH MASS   HOLMIUM LASER APPLICATION Left 04/06/2013   Procedure: HOLMIUM LASER APPLICATION;  Surgeon: Ricardo Likens, MD;  Location: [facility];  Service: Urology;  Laterality: Left;   LEFT HEART CATH AND CORONARY ANGIOGRAPHY N/A 04/29/2020   Procedure: LEFT HEART CATH AND CORONARY ANGIOGRAPHY;  Surgeon: Elmira Newman PARAS, MD;  Location: [facility];  Service: Cardiovascular;  Laterality: N/A;   LUMBAR LAMINECTOMY/DECOMPRESSION MICRODISCECTOMY N/A 04/20/2019   Procedure: Lumbar microdisectomy and decompression L5-S1 left;  Surgeon: Heide Ingle, MD;  Location: [facility];  Service: Orthopedics;  Laterality: N/A;    LYMPH NODE DISSECTION  ~ 1960   groin   stress cardiolite  09/06/2002   TONSILLECTOMY     VITRECTOMY Bilateral    Social History:  reports that he quit  smoking about 4 years ago. His smoking use included cigarettes. He started smoking about 44 years ago. He has a 40 pack-year smoking history. He has been exposed to tobacco smoke. He has never used smokeless tobacco. He reports that he does not drink alcohol  and does not use drugs.  Allergies  Allergen Reactions   Augmentin  [Amoxicillin -Pot Clavulanate] Nausea And Vomiting and Other (See Comments)    Did it involve swelling of the face/tongue/throat, SOB, or low BP? No Did it involve sudden or severe rash/hives, skin peeling, or any reaction on the inside of your mouth or nose? No Did you need to seek medical attention at a hospital or doctor's office? No When did it last happen?      5+ years If all above answers are "NO", may proceed with cephalosporin use.    Lexapro [Escitalopram Oxalate] Itching    Family History  Problem Relation Age of Onset   Stroke Father        strong FH cerebrovascular disease   Diabetes Brother    Heart disease Brother        CHF   Cancer Mother    Colon cancer Neg Hx     Prior to Admission medications   Medication Sig Start Date End Date Taking? Authorizing Provider  acetaminophen  (TYLENOL ) 500 MG tablet Take 1,000 mg by mouth every 6 (six) hours as needed for mild pain or headache.  [provider]  albuterol  (PROVENTIL ) (2.5 MG/3ML) 0.083% nebulizer solution Inhale 3 mLs by nebulization every 6 (six) hours as needed for wheezing or shortness of breath. 02/13/24   Parrett, Madelin RAMAN, NP  albuterol  (VENTOLIN  HFA) 108 (90 Base) MCG/ACT inhaler INHALE 2 PUFFS BY MOUTH INTO THE LUNGS EVERY 6 HOURS AS NEEDED FOR WHEEZING OR SHORTNESS OF BREATH. 03/25/23 04/03/24  Jude Harden GAILS, MD  aspirin  EC 81 MG tablet Take 1 tablet (81 mg total) by mouth daily. Swallow whole. 04/16/20   Tolia, Sunit, DO  cetirizine (ZYRTEC) 10 MG tablet Take 10 mg by mouth at bedtime.    [provider]  Continuous Blood Gluc Sensor (FREESTYLE LIBRE 3 SENSOR) MISC Change  sensor every 14 days to monitor blood glucose continously 04/08/22     Continuous Glucose Sensor (FREESTYLE LIBRE 3 PLUS SENSOR) MISC Change every 15 days to monitor blood glucose continously 05/31/23     finasteride  (PROSCAR ) 5 MG tablet Take 1 tablet (5 mg total) by mouth daily. 03/23/24     Fluticasone -Umeclidin-Vilant (TRELEGY ELLIPTA ) 200-62.5-25 MCG/ACT AEPB Inhale 1 puff into the lungs daily at 2 PM. 02/13/24   Parrett, Madelin RAMAN, NP  Fluticasone -Umeclidin-Vilant (TRELEGY ELLIPTA ) 200-62.5-25 MCG/ACT AEPB Inhale 1 puff into the lungs daily. 03/08/24   Jude Harden GAILS, MD  Glucagon  (GVOKE HYPOPEN  2-PACK) 1 MG/0.2ML SOAJ Inject under the skin as directed for severe hypoglycemia. 06/17/22     Glucagon  (GVOKE HYPOPEN  2-PACK) 1 MG/0.2ML SOAJ Use as directed under the skin for severe hypoglycemia. 05/31/23     glucose blood (CONTOUR NEXT TEST) test strip USE 8 TIMES DAILY AS DIRECTED TO MONITOR BLOOD GLUCOSE 04/23/22     HYDROcodone -acetaminophen  (NORCO/VICODIN) 5-325 MG tablet Take 1 tablet by mouth 3 (three) times daily as needed. 04/02/24     ibuprofen  (ADVIL ) 200 MG tablet Take 800 mg by mouth every 8 (eight) hours as needed (pain.).    [provider]  insulin  aspart (FIASP  FLEXTOUCH) 100 UNIT/ML FlexTouch Pen INJECT UP TO 100 UNITS A Goldenstein (ICR 20, ISF 20, target 120) Patient taking differently: Inject 3 Units into the skin once. 03/23/23     insulin  aspart (FIASP  FLEXTOUCH) 100 UNIT/ML FlexTouch Pen INJECT UP TO 100 UNITS A Myricks (ICR 20, ISF 20, target 120) 04/01/24     insulin  aspart (NOVOLOG  FLEXPEN) 100 UNIT/ML FlexPen Inject up to 100 units subcutaneously per Morey as directed (ICR 20, ISF 20, target 120) 30 pens for 90 Rorabaugh Dx [id number] 04/02/24     insulin  aspart (NOVOLOG  FLEXPEN) 100 UNIT/ML FlexPen Inject up to 100 units subcutaneously per Shad as directed (ICR 20, ISF 20, target 120) 04/02/24     insulin  degludec (TRESIBA  FLEXTOUCH) 100 UNIT/ML FlexTouch Pen Inject 40 Units into the skin daily.  12/20/22     Insulin  Degludec FlexTouch 100 UNIT/ML SOPN Inject 25 Units into the skin daily. 10/19/23     insulin  glargine (LANTUS  SOLOSTAR) 100 UNIT/ML Solostar Pen Inject 25-30 Units into the skin daily as directed 10/20/23     insulin  lispro (HUMALOG  KWIKPEN) 100 UNIT/ML KwikPen INJECT UP TO 100 UNITS A Lombardozzi UNDER THE SKIN AS DIRECTED 04/01/24     Insulin  Pen Needle (TECHLITE PEN NEEDLES) 31G X 8 MM MISC Use 1 pen needle 4-5 times daily as directed. 11/30/22     Insulin  Pen Needle 31G X 8 MM MISC Use 1 pen needle subcutaneously 4 - 5 times daily as directed. 09/07/21     Insulin  Syringe-Needle U-100 31G X 5/16  0.3 ML MISC use to inject insulin  6 to 8 times daily 12/07/23   Nichole Senior, MD  levothyroxine  (SYNTHROID ) 100 MCG tablet Take 1 tablet (100 mcg total) by mouth daily. 03/22/24     metoCLOPramide  (REGLAN ) 5 MG tablet Take 1 tablet (5 mg total) by mouth 2 (two) times daily before a meal. 05/09/23     pantoprazole  (PROTONIX ) 40 MG tablet Take 1 tablet by mouth daily as directed. 02/21/24     predniSONE  (DELTASONE ) 10 MG tablet Take 4 tabs daily for 2 days, then 3 tabs for 2 days, 2 tabs for 2 days, then 1 tab for 2 days, then stop 04/03/24   Cobb, Comer GAILS, NP  promethazine  (PHENERGAN ) 25 MG tablet Take 1 tablet (25 mg total) by mouth 3 (three) times daily as needed. 04/28/23     promethazine -dextromethorphan  (PROMETHAZINE -DM) 6.25-15 MG/5ML syrup Take 5 mLs by mouth 4 (four) times daily as needed for cough. 04/03/24   Cobb, Comer GAILS, NP  simvastatin  (ZOCOR ) 40 MG tablet Take 1 tablet (40 mg total) by mouth daily. 07/27/23     tamsulosin  (FLOMAX ) 0.4 MG CAPS capsule Take 1 capsule (0.4 mg total) by mouth daily. 03/20/24     tamsulosin  (FLOMAX ) 0.4 MG CAPS capsule Take 1 capsule (0.4 mg total) by mouth daily. 03/23/24     traZODone  (DESYREL ) 150 MG tablet Take 1 tablet (150 mg total) by mouth at bedtime as needed for sleep 02/19/24     varenicline  (CHANTIX ) 1 MG tablet Take 1 mg by mouth daily.    [provider]    Physical Exam: Vitals:   04/10/24 1021 04/10/24 1024 04/10/24 1025 04/10/24 1200  BP: (!) 154/64   (!) 152/60  Pulse: (!) 112   (!) 111  Resp: (!) 22   (!) 28  Temp: 97.9 F (36.6 C)     SpO2: (!) 83% 92% 92% 94%   Physical Exam Vitals and nursing note reviewed.  Constitutional:      General: He is awake. He is not in acute distress.    Appearance: He is well-developed. He is ill-appearing.     Interventions: Nasal cannula in place.  HENT:     Head: Normocephalic.     Nose: No rhinorrhea.     Mouth/Throat:     Mouth: Mucous membranes are moist.  Eyes:     General: No scleral icterus.    Pupils: Pupils are equal, round, and reactive to light.  Neck:     Vascular: No JVD.  Cardiovascular:     Rate and Rhythm: Normal rate and regular rhythm.     Heart sounds: S1 normal and S2 normal.  Pulmonary:     Breath sounds: Decreased breath sounds, wheezing, rhonchi and rales present.  Abdominal:     General: Bowel sounds are normal. There is no distension.     Palpations: Abdomen is soft.     Tenderness: There is no abdominal tenderness. There is no right CVA tenderness or left CVA tenderness.  Musculoskeletal:     Cervical back: Neck supple.     Right lower leg: No edema.     Left lower leg: No edema.  Skin:    General: Skin is warm and dry.  Neurological:     General: No focal deficit present.     Mental Status: He is alert and oriented to person, place, and time.  Psychiatric:        Mood and Affect: Mood normal. Mood is not anxious.  Behavior: Behavior normal. Behavior is not agitated. Behavior is cooperative.     Data Reviewed:  Results are pending, will review when available.  Assessment and Plan: Principal Problem:   Acute respiratory failure with hypoxia (HCC) In the setting of:   Pneumonia of right lower lobe due to infectious organism   Bronchiectasis with (acute) exacerbation (HCC)   COPD with acute exacerbation (HCC) Admit to  SDU/inpatient. Continue supplemental oxygen . Scheduled and as needed bronchodilators. Continue ceftriaxone  1 g IVPB daily. Continue azithromycin  500 mg IVPB daily. Check strep pneumoniae urinary antigen. Check sputum Gram stain, culture and sensitivity. Follow-up blood culture and sensitivity. Follow-up CBC and chemistry in the morning. Pulmonary consult appreciated.  Active Problems:   Hypokalemia Replacing. Follow-up potassium level.    Pseudohyponatremia Secondary to hyperglycemia. Blood glucose control.    Thrombocytosis In the setting of acute illness. Follow-up platelet count.    HLD (hyperlipidemia) Continue simvastatin  40 mg p.o. daily.    Depression Continue trazodone  150 mg p.o. at bedtime.    CAD (coronary artery disease) Continue aspirin  and simvastatin .    Esophageal reflux Continue pantoprazole  40 mg p.o. daily.    DM type 1 (diabetes mellitus, type 1) (HCC) Carbohydrate modified diet. CBG monitoring with RI SS. Check hemoglobin A1c.    Diastolic dysfunction Does not seem to be volume overloaded.    Hypothyroidism Continue levothyroxine  100 mcg p.o. daily.    Benign prostatic hyperplasia with lower urinary tract symptoms  Continue finasteride  5 mg p.o. daily. Continue tamsulosin  0.4 mg p.o. daily.    Advance Care Planning:   Code Status: Full Code   Consults:   Family Communication: His spouse was at bedside.  Severity of Illness: The appropriate patient status for this patient is INPATIENT. Inpatient status is judged to be reasonable and necessary in order to provide the required intensity of service to ensure the patient's safety. The patient's presenting symptoms, physical exam findings, and initial radiographic and laboratory data in the context of their chronic comorbidities is felt to place them at high risk for further clinical deterioration. Furthermore, it is not anticipated that the patient will be medically stable for discharge from  the hospital within 2 midnights of admission.   * I certify that at the point of admission it is my clinical judgment that the patient will require inpatient hospital care spanning beyond 2 midnights from the point of admission due to high intensity of service, high risk for further deterioration and high frequency of surveillance required.*  Author: Alm Dorn Castor, MD 04/10/2024 1:16 PM  For on call review [email or web address].   This document was prepared using Dragon voice recognition software and may contain some unintended transcription errors.

## 2024-04-10 NOTE — Plan of Care (Signed)
  Problem: Education: Goal: Ability to describe self-care measures that may prevent or decrease complications (Diabetes Survival Skills Education) will improve Outcome: Progressing   Problem: Tissue Perfusion: Goal: Adequacy of tissue perfusion will improve Outcome: Progressing   Problem: Education: Goal: Knowledge of General Education information will improve Description: Including pain rating scale, medication(s)/side effects and non-pharmacologic comfort measures Outcome: Progressing   Problem: Health Behavior/Discharge Planning: Goal: Ability to manage health-related needs will improve Outcome: Progressing

## 2024-04-10 NOTE — ED Triage Notes (Signed)
 Pt from home with reports of SHOB and cough. Denies fevers. Pts RA sat 80%. Pt placed on 15L high flow.  Pt received 1 duoneb and 125mg  solumedrol en route.

## 2024-04-10 NOTE — Telephone Encounter (Signed)
**Note De-identified  [place] Obfuscation** Please advise 

## 2024-04-10 NOTE — Inpatient Diabetes Management (Incomplete)
 Inpatient Diabetes Program Recommendations  AACE/ADA: New Consensus Statement on Inpatient Glycemic Control  Target Ranges:  Prepandial:   less than 140 mg/dL      Peak postprandial:   less than 180 mg/dL (1-2 hours)      Critically ill patients:  140 - 180 mg/dL    Latest Reference Range & Units 02/23/24 08:24  Hemoglobin A1C 4.8 - 5.6 % 6.7 (H)    Latest Reference Range & Units 04/10/24 10:40  Sodium 135 - 145 mmol/L 134 (L)  Potassium 3.5 - 5.1 mmol/L 3.3 (L)  Chloride 98 - 111 mmol/L 98  CO2 22 - 32 mmol/L 20 (L)  Glucose 70 - 99 mg/dL 746 (H)  BUN 8 - 23 mg/dL 14  Creatinine 9.38 - 8.75 mg/dL 9.19  Calcium 8.9 - 89.6 mg/dL 8.9  Anion gap 5 - 15  17 (H)    Latest Reference Range & Units 04/10/24 10:40  Glucose 70 - 99 mg/dL 746 (H)    Review of Glycemic Control  Diabetes history: DM1 Outpatient Diabetes medications:  Current orders for Inpatient glycemic control: None   Inpatient Diabetes Program Recommendations:

## 2024-04-10 NOTE — Consult Note (Signed)
 NAME:  James [name], MRN:  [id number], DOB:  1956/04/23, LOS: 0 ADMISSION DATE:  04/10/2024, CONSULTATION DATE:  9/23 REFERRING MD:  Dr. Celinda, CHIEF COMPLAINT:  PNA; acute respiratory failure w/ hypoxia   History of Present Illness:  Patient is a 68 yo M w/ pertinent PMH asthma, multiple episodes of pna (seen by Dr. Jude outpt), anxiety, depression, T1DM, HLD, hypothyroidism, GERD, CKD, seizures presents to [facility] on 9/23 w/ hypoxic respiratory failure and pna.  Patient has hx of recurrent pna. Recently treated in 01/2024 w/ clinda and doxy w/ course of prednisone . States has cough in morning feels like he may be aspirating. Denies fever. Having worsening sob over the past month. On 9/23 checked pulse ox at home w/ sats in 70s. EMS called and placed on o2. Gave nebs and steroids. Transported to [facility] ED. Placed on salter Sophia. Afebrile and wbc 8. Covid/flu/rsv and mrsa pcr negative. CXR w/ RLL pna. CTA chest negative for PE; RML and RLL consolidation. Given rocephin zenovia. PCCM consulted.  Pertinent  Medical History   Past Medical History:  Diagnosis Date   Anxiety    Asthma    ASTHMATIC BRONCHITIS, ACUTE 10/25/2008   Bladder neck obstruction    CARPAL TUNNEL SYNDROME, BILATERAL 07/31/2007   issues resolved, no surgery   Cervical disc disease    CORONARY ARTERY DISEASE 04/03/2007   Depression    DIABETES MELLITUS, TYPE I 04/03/2007   Diabetic retinopathy associated with diabetes mellitus due to underlying condition (HCC) 04/03/2007   DM W/EYE MANIFESTATIONS, TYPE I, UNCONTROLLED 04/04/2007   DM W/RENAL MNFST, TYPE I, UNCONTROLLED 04/04/2007   ED (erectile dysfunction)    History of kidney stones    HYPERLIPIDEMIA 04/04/2007   Hypothyroidism    Pneumonia    several times and again today (02/13/2104)   Renal insufficiency    Seizures (HCC)    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; none in the last couple years (02/12/2014)   Spinal stenosis      Significant Hospital Events: Including  procedures, antibiotic start and stop dates in addition to other pertinent events   9/23 admit w/ hypoxic resp failure 2/2 to Right sided pna  Interim History / Subjective:  See above  Objective    Blood pressure (!) 148/58, pulse 100, temperature 98.1 F (36.7 C), temperature source Oral, resp. rate (!) 25, SpO2 94%.        Intake/Output Summary (Last 24 hours) at 04/10/2024 1748 Last data filed at 04/10/2024 1746 Gross per 24 hour  Intake 492.33 ml  Output --  Net 492.33 ml   There were no vitals filed for this visit.  Examination: General:  NAD HEENT: MM pink/moist; poor dentition; Amesti in place Neuro: Aox3; MAE CV: s1s2, RRR, no m/r/g PULM:  dim clear BS bilaterally; 9 l/m sats 94% GI: soft, bsx4 active  Extremities: warm/dry, no edema  Skin: no rashes or lesions appreciated   Resolved problem list   Assessment and Plan   Acute respiratory failure w/ hypoxia RML/RLL PNA -patient states he has been having concerns of difficulty swallowing and aspirating ever since his spinal surgeries. Has not been able to be seen by SLP yet  Hx of asthma Plan: -wean Bethany for sats >92% -cont rocephin /azithro -check rvp, legionella/strep, exp sputum -cont guaifenesin  -pulm toiletry: flutter/IS -pt/ot/oob -3% nebs w/ duoneb; triple therapy maintenance nebs -started on steroids per primary -will have slp eval for possible aspiration -PPI for gerd  Labs   CBC: Recent Labs  Lab  04/10/24 1040  WBC 8.3  NEUTROABS 5.8  HGB 13.0  HCT 39.3  MCV 90.1  PLT 431*    Basic Metabolic Panel: Recent Labs  Lab 04/10/24 1040  NA 134*  K 3.3*  CL 98  CO2 20*  GLUCOSE 253*  BUN 14  CREATININE 0.80  CALCIUM 8.9  MG 2.0  PHOS 2.0*   GFR: Estimated Creatinine Clearance: 87.1 mL/min (by C-G formula based on SCr of 0.8 mg/dL). Recent Labs  Lab 04/10/24 1040  WBC 8.3    Liver Function Tests: No results for input(s): AST, ALT, ALKPHOS, BILITOT, PROT, ALBUMIN in  the last 168 hours. No results for input(s): LIPASE, AMYLASE in the last 168 hours. No results for input(s): AMMONIA in the last 168 hours.  ABG    Component Value Date/Time   HCO3 22.0 04/11/2013 0749   TCO2 23 04/29/2020 1350   ACIDBASEDEF 3.1 (H) 04/11/2013 0749   O2SAT 51.4 04/11/2013 0749     Coagulation Profile: No results for input(s): INR, PROTIME in the last 168 hours.  Cardiac Enzymes: No results for input(s): CKTOTAL, CKMB, CKMBINDEX, TROPONINI in the last 168 hours.  HbA1C: Hgb A1c MFr Bld  Date/Time Value Ref Range Status  02/23/2024 08:24 AM 6.7 (H) 4.8 - 5.6 % Final    Comment:    (NOTE)         Prediabetes: 5.7 - 6.4         Diabetes: >6.4         Glycemic control for adults with diabetes: <7.0   10/26/2019 10:19 AM 6.5 (H) 4.8 - 5.6 % Final    Comment:    (NOTE)         Prediabetes: 5.7 - 6.4         Diabetes: >6.4         Glycemic control for adults with diabetes: <7.0     CBG: Recent Labs  Lab 04/10/24 1513 04/10/24 1709  GLUCAP 290* 251*    Review of Systems:   Review of Systems  Constitutional:  Negative for fever.  Respiratory:  Positive for cough, sputum production and shortness of breath.   Cardiovascular:  Negative for chest pain.  Gastrointestinal:  Negative for abdominal pain, nausea and vomiting.     Past Medical History:  He,  has a past medical history of Anxiety, Asthma, ASTHMATIC BRONCHITIS, ACUTE (10/25/2008), Bladder neck obstruction, CARPAL TUNNEL SYNDROME, BILATERAL (07/31/2007), Cervical disc disease, CORONARY ARTERY DISEASE (04/03/2007), Depression, DIABETES MELLITUS, TYPE I (04/03/2007), Diabetic retinopathy associated with diabetes mellitus due to underlying condition (HCC) (04/03/2007), DM W/EYE MANIFESTATIONS, TYPE I, UNCONTROLLED (04/04/2007), DM W/RENAL MNFST, TYPE I, UNCONTROLLED (04/04/2007), ED (erectile dysfunction), History of kidney stones, HYPERLIPIDEMIA (04/04/2007), Hypothyroidism,  Pneumonia, Renal insufficiency, Seizures (HCC), and Spinal stenosis.   Surgical History:   Past Surgical History:  Procedure Laterality Date   ANTERIOR CERVICAL DECOMP/DISCECTOMY FUSION  2000   couple screws and a plate   ANTERIOR CERVICAL DECOMP/DISCECTOMY FUSION N/A 06/24/2016   Procedure: ANTERIOR CERVICAL DECOMPRESSION/DISCECTOMY FUSION CERVICAL FOUR - CERVICAL FIVE, CERVICAL FIVE - CERVICAL SIX; REMOVAL TETHER CERVICAL PLATE;  Surgeon: Lamar Peaches, MD;  Location: [facility];  Service: Neurosurgery;  Laterality: N/A;  ANTERIOR CERVICAL DECOMPRESSION/DISCECTOMY FUSION CERVICAL FOUR - CERVICAL FIVE, CERVICAL FIVE - CERVICAL SIX; REMOVAL TETHER CERVICAL PLATE   APPENDECTOMY     BACK SURGERY     CARDIAC CATHETERIZATION  1990's   CATARACT EXTRACTION W/ INTRAOCULAR LENS  IMPLANT, BILATERAL Bilateral    CYSTOSCOPY WITH RETROGRADE PYELOGRAM,  URETEROSCOPY AND STENT PLACEMENT Bilateral 04/06/2013   Procedure: BILATERAL CYSTOSCOPY WITH RETROGRADE PYELOGRAMS, STENT PLACEMENTS AND LEFT URETEROSCOPY AND STONE REMOVAL;  Surgeon: Ricardo Likens, MD;  Location: [facility];  Service: Urology;  Laterality: Bilateral;   CYSTOSCOPY WITH STENT PLACEMENT Right 04/12/2013   Procedure: CYSTOSCOPY WITH STENT PLACEMENT;  Surgeon: Ricardo Likens, MD;  Location: [facility];  Service: Urology;  Laterality: Right;   CYSTOSCOPY/RETROGRADE/URETEROSCOPY Bilateral 04/12/2013   Procedure: CYSTOSCOPY/RETROGRADE/URETEROSCOPY;  Surgeon: Ricardo Likens, MD;  Location: [facility];  Service: Urology;  Laterality: Bilateral;  RIGHT RETROGRADE    EXCISION MASS LOWER EXTREMETIES Left 03/01/2024   Procedure: EXCISION MASS LOWER EXTREMITIES;  Surgeon: Teresa Lonni HERO, MD;  Location: [facility];  Service: General;  Laterality: Left;  REMOVAL OF LEFT INNER THIGH MASS   HOLMIUM LASER APPLICATION Left 04/06/2013   Procedure: HOLMIUM LASER APPLICATION;  Surgeon: Ricardo Likens, MD;  Location: [facility];  Service: Urology;  Laterality: Left;   LEFT HEART  CATH AND CORONARY ANGIOGRAPHY N/A 04/29/2020   Procedure: LEFT HEART CATH AND CORONARY ANGIOGRAPHY;  Surgeon: Elmira Newman PARAS, MD;  Location: [facility];  Service: Cardiovascular;  Laterality: N/A;   LUMBAR LAMINECTOMY/DECOMPRESSION MICRODISCECTOMY N/A 04/20/2019   Procedure: Lumbar microdisectomy and decompression L5-S1 left;  Surgeon: Heide Ingle, MD;  Location: [facility];  Service: Orthopedics;  Laterality: N/A;    LYMPH NODE DISSECTION  ~ 1960   groin   stress cardiolite  09/06/2002   TONSILLECTOMY     VITRECTOMY Bilateral      Social History:   reports that he quit smoking about 4 years ago. His smoking use included cigarettes. He started smoking about 44 years ago. He has a 40 pack-year smoking history. He has been exposed to tobacco smoke. He has never used smokeless tobacco. He reports that he does not drink alcohol  and does not use drugs.   Family History:  His family history includes Cancer in his mother; Diabetes in his brother; Heart disease in his brother; Stroke in his father. There is no history of Colon cancer.   Allergies Allergies  Allergen Reactions   Augmentin  [Amoxicillin -Pot Clavulanate] Nausea And Vomiting and Other (See Comments)    Did it involve swelling of the face/tongue/throat, SOB, or low BP? No Did it involve sudden or severe rash/hives, skin peeling, or any reaction on the inside of your mouth or nose? No Did you need to seek medical attention at a hospital or doctor's office? No When did it last happen?      5+ years If all above answers are "NO", may proceed with cephalosporin use.    Lexapro [Escitalopram Oxalate] Itching     Home Medications  Prior to Admission medications   Medication Sig Start Date End Date Taking? Authorizing Provider  acetaminophen  (TYLENOL ) 500 MG tablet Take 1,000 mg by mouth every 6 (six) hours as needed for mild pain or headache.    [provider]  albuterol  (PROVENTIL ) (2.5 MG/3ML) 0.083%  nebulizer solution Inhale 3 mLs by nebulization every 6 (six) hours as needed for wheezing or shortness of breath. 02/13/24   Parrett, Madelin RAMAN, NP  albuterol  (VENTOLIN  HFA) 108 (90 Base) MCG/ACT inhaler INHALE 2 PUFFS BY MOUTH INTO THE LUNGS EVERY 6 HOURS AS NEEDED FOR WHEEZING OR SHORTNESS OF BREATH. 03/25/23 04/03/24  Jude Harden GAILS, MD  aspirin  EC 81 MG tablet Take 1 tablet (81 mg total) by mouth daily. Swallow whole. 04/16/20   Tolia, Sunit, DO  cetirizine (ZYRTEC) 10 MG tablet  Take 10 mg by mouth at bedtime.    [provider]  Continuous Blood Gluc Sensor (FREESTYLE LIBRE 3 SENSOR) MISC Change sensor every 14 days to monitor blood glucose continously 04/08/22     Continuous Glucose Sensor (FREESTYLE LIBRE 3 PLUS SENSOR) MISC Change every 15 days to monitor blood glucose continously 05/31/23     finasteride  (PROSCAR ) 5 MG tablet Take 1 tablet (5 mg total) by mouth daily. 03/23/24     Fluticasone -Umeclidin-Vilant (TRELEGY ELLIPTA ) 200-62.5-25 MCG/ACT AEPB Inhale 1 puff into the lungs daily at 2 PM. 02/13/24   Parrett, Madelin RAMAN, NP  Fluticasone -Umeclidin-Vilant (TRELEGY ELLIPTA ) 200-62.5-25 MCG/ACT AEPB Inhale 1 puff into the lungs daily. 03/08/24   Jude Harden GAILS, MD  Glucagon  (GVOKE HYPOPEN  2-PACK) 1 MG/0.2ML SOAJ Inject under the skin as directed for severe hypoglycemia. 06/17/22     Glucagon  (GVOKE HYPOPEN  2-PACK) 1 MG/0.2ML SOAJ Use as directed under the skin for severe hypoglycemia. 05/31/23     glucose blood (CONTOUR NEXT TEST) test strip USE 8 TIMES DAILY AS DIRECTED TO MONITOR BLOOD GLUCOSE 04/23/22     HYDROcodone -acetaminophen  (NORCO/VICODIN) 5-325 MG tablet Take 1 tablet by mouth 3 (three) times daily as needed. 04/02/24     ibuprofen  (ADVIL ) 200 MG tablet Take 800 mg by mouth every 8 (eight) hours as needed (pain.).    [provider]  insulin  aspart (FIASP  FLEXTOUCH) 100 UNIT/ML FlexTouch Pen INJECT UP TO 100 UNITS A Tutor (ICR 20, ISF 20, target 120) Patient taking differently:  Inject 3 Units into the skin once. 03/23/23     insulin  aspart (FIASP  FLEXTOUCH) 100 UNIT/ML FlexTouch Pen INJECT UP TO 100 UNITS A Al[place]za (ICR 20, ISF 20, target 120) 04/01/24     insulin  aspart (NOVOLOG  FLEXPEN) 100 UNIT/ML FlexPen Inject up to 100 units subcutaneously per [place]ribner as directed (ICR 20, ISF 20, target 120) 30 pens for 90 Oliveto Dx [id number] 04/02/24     insulin  aspart (NOVOLOG  FLEXPEN) 100 UNIT/ML FlexPen Inject up to 100 units subcutaneously per [place]ry as directed (ICR 20, ISF 20, target 120) 04/02/24     insulin  degludec (TRESIBA  FLEXTOUCH) 100 UNIT/ML FlexTouch Pen Inject 40 Units into the skin daily. 12/20/22     Insulin  Degludec FlexTouch 100 UNIT/ML SOPN Inject 25 Units into the skin daily. 10/19/23     insulin  glargine (LANTUS  SOLOSTAR) 100 UNIT/ML Solostar Pen Inject 25-30 Units into the skin daily as directed 10/20/23     insulin  lispro (HUMALOG  KWIKPEN) 100 UNIT/ML KwikPen INJECT UP TO 100 UNITS A [place] UNDER THE SKIN AS DIRECTED 04/01/24     Insulin  Pen Needle (TECHLITE PEN NEEDLES) 31G X 8 MM MISC Use 1 pen needle 4-5 times daily as directed. 11/30/22     Insulin  Pen Needle 31G X 8 MM MISC Use 1 pen needle subcutaneously 4 - 5 times daily as directed. 09/07/21     Insulin  Syringe-Needle U-100 31G X 5/16 0.3 ML MISC use to inject insulin  6 to 8 times daily 12/07/23   Nichole Senior, MD  levothyroxine  (SYNTHROID ) 100 MCG tablet Take 1 tablet (100 mcg total) by mouth daily. 03/22/24     metoCLOPramide  (REGLAN ) 5 MG tablet Take 1 tablet (5 mg total) by mouth 2 (two) times daily before a meal. 05/09/23     pantoprazole  (PROTONIX ) 40 MG tablet Take 1 tablet by mouth daily as directed. 02/21/24     predniSONE  (DELTASONE ) 10 MG tablet Take 4 tabs daily for 2 days, then 3 tabs for 2 days, 2  tabs for 2 days, then 1 tab for 2 days, then stop 04/03/24   Cobb, Comer GAILS, NP  promethazine  (PHENERGAN ) 25 MG tablet Take 1 tablet (25 mg total) by mouth 3 (three) times daily as needed. 04/28/23      promethazine -dextromethorphan  (PROMETHAZINE -DM) 6.25-15 MG/5ML syrup Take 5 mLs by mouth 4 (four) times daily as needed for cough. 04/03/24   Cobb, Comer GAILS, NP  simvastatin  (ZOCOR ) 40 MG tablet Take 1 tablet (40 mg total) by mouth daily. 07/27/23     tamsulosin  (FLOMAX ) 0.4 MG CAPS capsule Take 1 capsule (0.4 mg total) by mouth daily. 03/20/24     tamsulosin  (FLOMAX ) 0.4 MG CAPS capsule Take 1 capsule (0.4 mg total) by mouth daily. 03/23/24     traZODone  (DESYREL ) 150 MG tablet Take 1 tablet (150 mg total) by mouth at bedtime as needed for sleep 02/19/24     varenicline  (CHANTIX ) 1 MG tablet Take 1 mg by mouth daily.    [provider]     Critical care time: NA      RODGER Emilio DEVONNA [facility] 04/10/2024, 5:48 PM  Please see [email or web address] for pager details.  From 7A-7P if no response, please call [phone]. After hours, please call ELink [phone].

## 2024-04-10 NOTE — ED Provider Notes (Signed)
 [place] EMERGENCY DEPARTMENT AT [facility] Provider Note   CSN: [id number] Arrival date & time: 04/10/24  1007     Patient presents with: Shortness of Breath   James [name] is a 68 y.o. male with PMHx DM, HLD, CAD, asthma, GERD, hypothyroidism who presents to ED concerned for SOB and cough. Patient endorsing SOB progressively getting worse over the past 1 month. Last week, patient started having rhinorrhea and chest congestion with cough. Denies fever. Denies chest pain, nausea, vomiting, diarrhea. Patient endorsing 40 year smoking history but did quit smoking 2 years ago.    Shortness of Breath      Prior to Admission medications   Medication Sig Start Date End Date Taking? Authorizing Provider  acetaminophen  (TYLENOL ) 500 MG tablet Take 1,000 mg by mouth every 6 (six) hours as needed for mild pain or headache.    [provider]  albuterol  (PROVENTIL ) (2.5 MG/3ML) 0.083% nebulizer solution Inhale 3 mLs by nebulization every 6 (six) hours as needed for wheezing or shortness of breath. 02/13/24   Parrett, Madelin RAMAN, NP  albuterol  (VENTOLIN  HFA) 108 (90 Base) MCG/ACT inhaler INHALE 2 PUFFS BY MOUTH INTO THE LUNGS EVERY 6 HOURS AS NEEDED FOR WHEEZING OR SHORTNESS OF BREATH. 03/25/23 04/03/24  Jude Harden GAILS, MD  aspirin  EC 81 MG tablet Take 1 tablet (81 mg total) by mouth daily. Swallow whole. 04/16/20   Tolia, Sunit, DO  cetirizine (ZYRTEC) 10 MG tablet Take 10 mg by mouth at bedtime.    [provider]  Continuous Blood Gluc Sensor (FREESTYLE LIBRE 3 SENSOR) MISC Change sensor every 14 days to monitor blood glucose continously 04/08/22     Continuous Glucose Sensor (FREESTYLE LIBRE 3 PLUS SENSOR) MISC Change every 15 days to monitor blood glucose continously 05/31/23     finasteride  (PROSCAR ) 5 MG tablet Take 1 tablet (5 mg total) by mouth daily. 03/23/24     Fluticasone -Umeclidin-Vilant (TRELEGY ELLIPTA ) 200-62.5-25 MCG/ACT AEPB Inhale 1 puff into the lungs daily  at 2 PM. 02/13/24   Parrett, Madelin RAMAN, NP  Fluticasone -Umeclidin-Vilant (TRELEGY ELLIPTA ) 200-62.5-25 MCG/ACT AEPB Inhale 1 puff into the lungs daily. 03/08/24   Jude Harden GAILS, MD  Glucagon  (GVOKE HYPOPEN  2-PACK) 1 MG/0.2ML SOAJ Inject under the skin as directed for severe hypoglycemia. 06/17/22     Glucagon  (GVOKE HYPOPEN  2-PACK) 1 MG/0.2ML SOAJ Use as directed under the skin for severe hypoglycemia. 05/31/23     glucose blood (CONTOUR NEXT TEST) test strip USE 8 TIMES DAILY AS DIRECTED TO MONITOR BLOOD GLUCOSE 04/23/22     HYDROcodone -acetaminophen  (NORCO/VICODIN) 5-325 MG tablet Take 1 tablet by mouth 3 (three) times daily as needed. 04/02/24     ibuprofen  (ADVIL ) 200 MG tablet Take 800 mg by mouth every 8 (eight) hours as needed (pain.).    [provider]  insulin  aspart (FIASP  FLEXTOUCH) 100 UNIT/ML FlexTouch Pen INJECT UP TO 100 UNITS A Ciavarella (ICR 20, ISF 20, target 120) Patient taking differently: Inject 3 Units into the skin once. 03/23/23     insulin  aspart (FIASP  FLEXTOUCH) 100 UNIT/ML FlexTouch Pen INJECT UP TO 100 UNITS A Bree (ICR 20, ISF 20, target 120) 04/01/24     insulin  aspart (NOVOLOG  FLEXPEN) 100 UNIT/ML FlexPen Inject up to 100 units subcutaneously per Shingler as directed (ICR 20, ISF 20, target 120) 30 pens for 90 Mcweeney Dx [id number] 04/02/24     insulin  aspart (NOVOLOG  FLEXPEN) 100 UNIT/ML FlexPen Inject up to 100 units subcutaneously per Scheiderer as directed (  ICR 20, ISF 20, target 120) 04/02/24     insulin  degludec (TRESIBA  FLEXTOUCH) 100 UNIT/ML FlexTouch Pen Inject 40 Units into the skin daily. 12/20/22     Insulin  Degludec FlexTouch 100 UNIT/ML SOPN Inject 25 Units into the skin daily. 10/19/23     insulin  glargine (LANTUS  SOLOSTAR) 100 UNIT/ML Solostar Pen Inject 25-30 Units into the skin daily as directed 10/20/23     insulin  lispro (HUMALOG  KWIKPEN) 100 UNIT/ML KwikPen INJECT UP TO 100 UNITS A Loos UNDER THE SKIN AS DIRECTED 04/01/24     Insulin  Pen Needle (TECHLITE PEN NEEDLES) 31G X 8 MM  MISC Use 1 pen needle 4-5 times daily as directed. 11/30/22     Insulin  Pen Needle 31G X 8 MM MISC Use 1 pen needle subcutaneously 4 - 5 times daily as directed. 09/07/21     Insulin  Syringe-Needle U-100 31G X 5/16 0.3 ML MISC use to inject insulin  6 to 8 times daily 12/07/23   Nichole Senior, MD  levothyroxine  (SYNTHROID ) 100 MCG tablet Take 1 tablet (100 mcg total) by mouth daily. 03/22/24     metoCLOPramide  (REGLAN ) 5 MG tablet Take 1 tablet (5 mg total) by mouth 2 (two) times daily before a meal. 05/09/23     pantoprazole  (PROTONIX ) 40 MG tablet Take 1 tablet by mouth daily as directed. 02/21/24     predniSONE  (DELTASONE ) 10 MG tablet Take 4 tabs daily for 2 days, then 3 tabs for 2 days, 2 tabs for 2 days, then 1 tab for 2 days, then stop 04/03/24   Cobb, Comer GAILS, NP  promethazine  (PHENERGAN ) 25 MG tablet Take 1 tablet (25 mg total) by mouth 3 (three) times daily as needed. 04/28/23     promethazine -dextromethorphan  (PROMETHAZINE -DM) 6.25-15 MG/5ML syrup Take 5 mLs by mouth 4 (four) times daily as needed for cough. 04/03/24   Cobb, Comer GAILS, NP  simvastatin  (ZOCOR ) 40 MG tablet Take 1 tablet (40 mg total) by mouth daily. 07/27/23     tamsulosin  (FLOMAX ) 0.4 MG CAPS capsule Take 1 capsule (0.4 mg total) by mouth daily. 03/20/24     tamsulosin  (FLOMAX ) 0.4 MG CAPS capsule Take 1 capsule (0.4 mg total) by mouth daily. 03/23/24     traZODone  (DESYREL ) 150 MG tablet Take 1 tablet (150 mg total) by mouth at bedtime as needed for sleep 02/19/24     varenicline  (CHANTIX ) 1 MG tablet Take 1 mg by mouth daily.    [provider]    Allergies: Augmentin  [amoxicillin -pot clavulanate] and Lexapro [escitalopram oxalate]    Review of Systems  Respiratory:  Positive for shortness of breath.     Updated Vital Signs BP (!) 152/60   Pulse (!) 111   Temp 97.9 F (36.6 C)   Resp (!) 28   SpO2 94%   Physical Exam Vitals and nursing note reviewed.  Constitutional:      General: He is not in acute  distress.    Appearance: He is not ill-appearing or toxic-appearing.  HENT:     Head: Normocephalic and atraumatic.     Mouth/Throat:     Mouth: Mucous membranes are moist.     Pharynx: No posterior oropharyngeal erythema.  Eyes:     General: No scleral icterus.       Right eye: No discharge.        Left eye: No discharge.     Conjunctiva/sclera: Conjunctivae normal.  Cardiovascular:     Rate and Rhythm: Regular rhythm. Tachycardia present.  Pulses: Normal pulses.     Heart sounds: Normal heart sounds. No murmur heard. Pulmonary:     Effort: Pulmonary effort is normal. No respiratory distress.     Breath sounds: Rales present. No wheezing or rhonchi.     Comments: Mild rales and decreased breath sounds Abdominal:     Tenderness: There is no abdominal tenderness.  Musculoskeletal:     Right lower leg: No edema.     Left lower leg: No edema.     Comments: No calf swelling or tenderness  Skin:    General: Skin is warm and dry.     Findings: No rash.  Neurological:     General: No focal deficit present.     Mental Status: He is alert and oriented to person, place, and time. Mental status is at baseline.  Psychiatric:        Mood and Affect: Mood normal.        Behavior: Behavior normal.     (all labs ordered are listed, but only abnormal results are displayed) Labs Reviewed  BASIC METABOLIC PANEL WITH GFR - Abnormal; Notable for the following components:      Result Value   Sodium 134 (*)    Potassium 3.3 (*)    CO2 20 (*)    Glucose, Bld 253 (*)    Anion gap 17 (*)    All other components within normal limits  CBC WITH DIFFERENTIAL/PLATELET - Abnormal; Notable for the following components:   Platelets 431 (*)    All other components within normal limits  D-DIMER, QUANTITATIVE - Abnormal; Notable for the following components:   D-Dimer, Quant 0.81 (*)    All other components within normal limits  RESP PANEL BY RT-PCR (RSV, FLU A&B, COVID)  [id number]    EKG: EKG  Interpretation Date/Time:  Tuesday April 10 2024 10:16:57 EDT Ventricular Rate:  110 PR Interval:  146 QRS Duration:  88 QT Interval:  335 QTC Calculation: 454 R Axis:   80  Text Interpretation: Sinus tachycardia Confirmed by Bernard Drivers ([id number]) on 04/10/2024 10:48:48 AM  Radiology: CT Angio Chest PE W/Cm &/Or Wo Cm Result Date: 04/10/2024 CLINICAL DATA:  Pulmonary embolism (PE) suspected, high prob reports of SHOB and cough. Denies fevers. Pts RA sat 80%. Pt placed on 15L high flow. EXAM: CT ANGIOGRAPHY CHEST WITH CONTRAST TECHNIQUE: Multidetector CT imaging of the chest was performed using the standard protocol during bolus administration of intravenous contrast. Multiplanar CT image reconstructions and MIPs were obtained to evaluate the vascular anatomy. RADIATION DOSE REDUCTION: This exam was performed according to the departmental dose-optimization program which includes automated exposure control, adjustment of the mA and/or kV according to patient size and/or use of iterative reconstruction technique. CONTRAST:  75mL OMNIPAQUE  IOHEXOL  350 MG/ML SOLN COMPARISON:  CHEST XR, 04/10/2024. CT CHEST, 07/18/2023. CTA PE, 06/04/2022. FINDINGS: Suboptimal evaluation, secondary to motion degradation. Cardiovascular: Satisfactory opacification of the pulmonary arteries to the segmental level. No segmental or larger pulmonary embolus. Normal heart size. No pericardial effusion. Severe burden of calcified multivessel coronary atherosclerosis, greatest within the LAD. Mediastinum/Nodes: No enlarged mediastinal, hilar, or axillary lymph nodes. Thyroid  gland, trachea, and esophagus demonstrate no significant findings. Lungs/Pleura: Bronchovascular pulmonary opacities involving the middle and RIGHT lower lobes. The LEFT lung is clear. No pleural effusion or pneumothorax. Upper Abdomen: No acute abnormality. Musculoskeletal: No acute chest wall abnormality. No acute osseous findings. Review of the MIP images  confirms the above findings. IMPRESSION: 1. No segmental or larger pulmonary embolus.  2. Middle and RIGHT lower lobe pulmonary consolidation, consistent with pneumonia. Electronically Signed   By: Thom Hall M.D.   On: 04/10/2024 12:49   DG Chest Port 1 View Result Date: 04/10/2024 CLINICAL DATA:  Shortness of breath. EXAM: PORTABLE CHEST 1 VIEW COMPARISON:  02/13/2024 FINDINGS: The cardiac silhouette, mediastinal and hilar contours are within normal limits and stable. Significant airspace opacity in the right lower lobe consistent with pneumonia. There is an associated small parapneumonic effusion. The left lung is clear. The bony structures are intact. IMPRESSION: Right lower lobe pneumonia with small parapneumonic effusion. Electronically Signed   By: MYRTIS Stammer M.D.   On: 04/10/2024 11:10     .Critical Care  Performed by: Hoy Nidia FALCON, PA-C Authorized by: Hoy Nidia FALCON, PA-C   Critical care provider statement:    Critical care time (minutes):  30   Critical care was necessary to treat or prevent imminent or life-threatening deterioration of the following conditions: PNA and hypoxia.   Critical care was time spent personally by me on the following activities:  Development of treatment plan with patient or surrogate, discussions with consultants, evaluation of patient's response to treatment, examination of patient, ordering and review of laboratory studies, ordering and review of radiographic studies, ordering and performing treatments and interventions, pulse oximetry, re-evaluation of patient's condition and review of old charts   Care discussed with: admitting provider      Medications Ordered in the ED  azithromycin  (ZITHROMAX ) 500 mg in sodium chloride  0.9 % 250 mL IVPB (500 mg Intravenous New Bag/Given 04/10/24 1236)  lactated ringers  infusion (has no administration in time range)  potassium chloride  SA (KLOR-CON  M) CR tablet 40 mEq (has no administration in time  range)  ipratropium-albuterol  (DUONEB) 0.5-2.5 (3) MG/3ML nebulizer solution 3 mL (3 mLs Nebulization Given 04/10/24 1024)  ipratropium-albuterol  (DUONEB) 0.5-2.5 (3) MG/3ML nebulizer solution 3 mL (3 mLs Nebulization Given 04/10/24 1024)  ipratropium-albuterol  (DUONEB) 0.5-2.5 (3) MG/3ML nebulizer solution 3 mL (3 mLs Nebulization Given 04/10/24 1024)  iohexol  (OMNIPAQUE ) 350 MG/ML injection 75 mL (75 mLs Intravenous Contrast Given 04/10/24 1146)  HYDROcodone -acetaminophen  (NORCO/VICODIN) 5-325 MG per tablet 1 tablet (1 tablet Oral Given 04/10/24 1158)  cefTRIAXone  (ROCEPHIN ) 1 g in sodium chloride  0.9 % 100 mL IVPB (0 g Intravenous Stopped 04/10/24 1233)                                    Medical Decision Making Amount and/or Complexity of Data Reviewed Labs: ordered. Radiology: ordered.  Risk Prescription drug management.   This patient presents to the ED for concern of shortness of breath, this involves an extensive number of treatment options, and is a complaint that carries with it a high risk of complications and morbidity.  The differential diagnosis includes Anxiety, Anaphylaxis/Angioedema, Aspirated FB, Arrhythmia, CHF, Asthma, COPD, PNA, COVID/Flu/RSV, STEMI, Tamponade, TPNX, Sepsis   Co morbidities that complicate the patient evaluation  DM, HLD, CAD, asthma, GERD, hypothyroidism   Additional history obtained:  Dr. Nichole PCP   Problem List / ED Course / Critical interventions / Medication management  Patient presents to ED concern for SOB and cough.  Physical exam with mild rales and decreased breath sounds.  Patient is also tachycardic and tachypneic.  Initial O2 with EMS in the lower 80s. Patient not usually on supplemental O2 at home. I Ordered, and personally interpreted labs.  CBC without leukocytosis or anemia.  BMP with  mild hyponatremia and hypokalemia at 134/3.3 respectively.  There is also hyperglycemia at 253.  There is a mild anion gap at 17.  D-dimer elevated  at 0.81.  Respiratory panel negative. The patient was maintained on a cardiac monitor.  I personally viewed and interpreted the cardiac monitored which showed an underlying rhythm of: Sinus tachycardia. I ordered imaging studies including chest xray and CTA chest to assess for process contributing to patient's symptoms. I independently visualized and interpreted imaging which showed middle and right lower lobe PNA. I agree with the radiologist interpretation. Patient will need admission for PNA and hypoxia requiring new O2 requirement. I requested consultation with the hospitalist on-call Dr. Celinda,  and discussed lab and imaging findings as well as pertinent plan - they agree to admit patient. I have reviewed the patients home medicines and have made adjustments as needed    Social Determinants of Health:  none       Final diagnoses:  Community acquired pneumonia of right lower lobe of lung  Hypoxia    ED Discharge Orders     None          Hoy Nidia FALCON, [place] 04/10/24 1326    Bernard Drivers, MD 04/10/24 1536

## 2024-04-10 NOTE — Inpatient Diabetes Management (Addendum)
 Inpatient Diabetes Program Recommendations  AACE/ADA: New Consensus Statement on Inpatient Glycemic Control   Target Ranges:  Prepandial:   less than 140 mg/dL      Peak postprandial:   less than 180 mg/dL (1-2 hours)      Critically ill patients:  140 - 180 mg/dL    Latest Reference Range & Units 04/10/24 10:40  Sodium 135 - 145 mmol/L 134 (L)  Potassium 3.5 - 5.1 mmol/L 3.3 (L)  Chloride 98 - 111 mmol/L 98  CO2 22 - 32 mmol/L 20 (L)  Glucose 70 - 99 mg/dL 746 (H)  BUN 8 - 23 mg/dL 14  Creatinine 9.38 - 8.75 mg/dL 9.19  Calcium 8.9 - 89.6 mg/dL 8.9  Anion gap 5 - 15  17 (H)     Latest Reference Range & Units 04/10/24 10:40  Glucose 70 - 99 mg/dL 746 (H)    Latest Reference Range & Units 02/23/24 08:24  Hemoglobin A1C 4.8 - 5.6 % 6.7 (H)   Review of Glycemic Control Diabetes history: DM1  Outpatient Diabetes medications: Lantus  27 units daily in the morning and Novolog  - Insulin  to Carb ratio 1:20, Insulin  Sensitivity Factor 20, CBG target 120 mg/dl.   Current orders for Inpatient glycemic control: Novolog  0-20 units TID with meals.   Inpatient Diabetes Program Recommendations: Spoke with pt at bedside. Confirmed patient currently uses FreeStyle Libre3 for CMG and takes Lantus  27 units daily in the morning and Novolog  (Insulin  to Carb ratio 1:20, Insulin  Sensitivity Factor 20, CBG target 120 mg/dl) at home. He has been a Type 1 diabetic since a young age. He has used an insulin  pump in the past but now uses insulin  pens. Patient's most recent A1C was 6.7% on 02/23/24 - Very impressive! Pt sees Dr Nichole outpatient as his Endocrinologist. Pt states he monitors his diabetes very closely and does not feel well when his CBG is over 180 mg/dl. Patient will require basal and bolus insulin  during admission.   Insulin : Please consider starting Novolog  0-5 units at bedtime, Novolog  4 units TID with meals (if pt eats >50% of meals) and Lantus  20 units daily in the morning.   Diabetes  Coordinator will continue to monitor closely.   Thanks,  Lavanda Search, RN, MSN, [facility]  Inpatient Diabetes Coordinator  Pager [phone] (8a-5p)

## 2024-04-11 ENCOUNTER — Inpatient Hospital Stay (HOSPITAL_COMMUNITY)

## 2024-04-11 DIAGNOSIS — J9601 Acute respiratory failure with hypoxia: Secondary | ICD-10-CM | POA: Diagnosis not present

## 2024-04-11 LAB — EXPECTORATED SPUTUM ASSESSMENT W GRAM STAIN, RFLX TO RESP C

## 2024-04-11 LAB — GLUCOSE, CAPILLARY
Glucose-Capillary: 161 mg/dL — ABNORMAL HIGH (ref 70–99)
Glucose-Capillary: 175 mg/dL — ABNORMAL HIGH (ref 70–99)
Glucose-Capillary: 190 mg/dL — ABNORMAL HIGH (ref 70–99)
Glucose-Capillary: 193 mg/dL — ABNORMAL HIGH (ref 70–99)
Glucose-Capillary: 300 mg/dL — ABNORMAL HIGH (ref 70–99)

## 2024-04-11 LAB — COMPREHENSIVE METABOLIC PANEL WITH GFR
ALT: 7 U/L (ref 0–44)
AST: <10 U/L — ABNORMAL LOW (ref 15–41)
Albumin: 3.2 g/dL — ABNORMAL LOW (ref 3.5–5.0)
Alkaline Phosphatase: 99 U/L (ref 38–126)
Anion gap: 11 (ref 5–15)
BUN: 14 mg/dL (ref 8–23)
CO2: 21 mmol/L — ABNORMAL LOW (ref 22–32)
Calcium: 9 mg/dL (ref 8.9–10.3)
Chloride: 101 mmol/L (ref 98–111)
Creatinine, Ser: 0.74 mg/dL (ref 0.61–1.24)
GFR, Estimated: >60 mL/min (ref >60)
Glucose, Bld: 232 mg/dL — ABNORMAL HIGH (ref 70–99)
Potassium: 5.8 mmol/L — ABNORMAL HIGH (ref 3.5–5.1)
Sodium: 134 mmol/L — ABNORMAL LOW (ref 135–145)
Total Bilirubin: 0.4 mg/dL (ref 0.0–1.2)
Total Protein: 6.5 g/dL (ref 6.5–8.1)

## 2024-04-11 LAB — CBC
HCT: 35.6 % — ABNORMAL LOW (ref 39.0–52.0)
Hemoglobin: 12.1 g/dL — ABNORMAL LOW (ref 13.0–17.0)
MCH: 30.9 pg (ref 26.0–34.0)
MCHC: 34 g/dL (ref 30.0–36.0)
MCV: 90.8 fL (ref 80.0–100.0)
Platelets: 441 K/uL — ABNORMAL HIGH (ref 150–400)
RBC: 3.92 MIL/uL — ABNORMAL LOW (ref 4.22–5.81)
RDW: 11.8 % (ref 11.5–15.5)
WBC: 9.1 K/uL (ref 4.0–10.5)
nRBC: 0 % (ref 0.0–0.2)

## 2024-04-11 LAB — STREP PNEUMONIAE URINARY ANTIGEN: Strep Pneumo Urinary Antigen: NEGATIVE

## 2024-04-11 MED ORDER — IPRATROPIUM-ALBUTEROL 0.5-2.5 (3) MG/3ML IN SOLN
3.0000 mL | RESPIRATORY_TRACT | Status: AC
  Administered 2024-04-11 – 2024-04-13 (×12): 3 mL via RESPIRATORY_TRACT
  Filled 2024-04-11 (×11): qty 3

## 2024-04-11 MED ORDER — FLUTICASONE PROPIONATE 50 MCG/ACT NA SUSP
2.0000 | Freq: Two times a day (BID) | NASAL | Status: DC
  Administered 2024-04-11 – 2024-04-18 (×13): 2 via NASAL
  Filled 2024-04-11: qty 16

## 2024-04-11 MED ORDER — LORAZEPAM 1 MG PO TABS
1.0000 mg | ORAL_TABLET | Freq: Every evening | ORAL | Status: DC | PRN
  Administered 2024-04-12 – 2024-04-17 (×7): 1 mg via ORAL
  Filled 2024-04-11 (×7): qty 1

## 2024-04-11 MED ORDER — GUAIFENESIN 100 MG/5ML PO LIQD
10.0000 mL | Freq: Four times a day (QID) | ORAL | Status: DC
  Administered 2024-04-11 (×4): 10 mL via ORAL
  Filled 2024-04-11 (×4): qty 10

## 2024-04-11 MED ORDER — INSULIN ASPART 100 UNIT/ML IJ SOLN
8.0000 [IU] | Freq: Three times a day (TID) | INTRAMUSCULAR | Status: DC
  Administered 2024-04-11 – 2024-04-12 (×3): 8 [IU] via SUBCUTANEOUS

## 2024-04-11 MED ORDER — OXYMETAZOLINE HCL 0.05 % NA SOLN
1.0000 | Freq: Two times a day (BID) | NASAL | Status: AC
  Administered 2024-04-11: 1 via NASAL
  Filled 2024-04-11: qty 15

## 2024-04-11 MED ORDER — INSULIN GLARGINE 100 UNIT/ML ~~LOC~~ SOLN
40.0000 [IU] | Freq: Every day | SUBCUTANEOUS | Status: DC
  Administered 2024-04-11 – 2024-04-12 (×2): 40 [IU] via SUBCUTANEOUS
  Filled 2024-04-11 (×3): qty 0.4
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
## 2024-04-11 NOTE — Plan of Care (Signed)
  Problem: Metabolic: Goal: Ability to maintain appropriate glucose levels will improve Outcome: Progressing   Problem: Nutritional: Goal: Maintenance of adequate nutrition will improve Outcome: Progressing   Problem: Skin Integrity: Goal: Risk for impaired skin integrity will decrease Outcome: Progressing   Problem: Tissue Perfusion: Goal: Adequacy of tissue perfusion will improve Outcome: Progressing   Problem: Elimination: Goal: Will not experience complications related to urinary retention Outcome: Progressing   Problem: Safety: Goal: Ability to remain free from injury will improve Outcome: Progressing

## 2024-04-11 NOTE — Telephone Encounter (Signed)
 Recommendation would be to go to the hospital given failure to improve outpatient. It looks like he went in yesterday. Thanks.

## 2024-04-11 NOTE — Progress Notes (Signed)
 NAME:  James [name], MRN:  [id number], DOB:  12-Oct-1955, LOS: 1 ADMISSION DATE:  04/10/2024, CONSULTATION DATE:  9/23 REFERRING MD:  Dr. Celinda, CHIEF COMPLAINT:  PNA; acute respiratory failure w/ hypoxia   History of Present Illness:  Patient is a 68 yo M w/ pertinent PMH asthma, multiple episodes of pna (seen by Dr. Jude outpt), anxiety, depression, T1DM, HLD, hypothyroidism, GERD, CKD, seizures presents to [facility] on 9/23 w/ hypoxic respiratory failure and pna.  Patient has hx of recurrent pna. Recently treated in 01/2024 w/ clinda and doxy w/ course of prednisone . States has cough in morning feels like he may be aspirating. Denies fever. Having worsening sob over the past month. On 9/23 checked pulse ox at home w/ sats in 70s. EMS called and placed on o2. Gave nebs and steroids. Transported to [facility] ED. Placed on salter [place]. Afebrile and wbc 8. Covid/flu/rsv and mrsa pcr negative. CXR w/ RLL pna. CTA chest negative for PE; RML and RLL consolidation. Given rocephin zenovia. PCCM consulted.  Pertinent  Medical History   Past Medical History:  Diagnosis Date   Anxiety    Asthma    ASTHMATIC BRONCHITIS, ACUTE 10/25/2008   Bladder neck obstruction    CARPAL TUNNEL SYNDROME, BILATERAL 07/31/2007   issues resolved, no surgery   Cervical disc disease    CORONARY ARTERY DISEASE 04/03/2007   Depression    DIABETES MELLITUS, TYPE I 04/03/2007   Diabetic retinopathy associated with diabetes mellitus due to underlying condition (HCC) 04/03/2007   DM W/EYE MANIFESTATIONS, TYPE I, UNCONTROLLED 04/04/2007   DM W/RENAL MNFST, TYPE I, UNCONTROLLED 04/04/2007   ED (erectile dysfunction)    History of kidney stones    HYPERLIPIDEMIA 04/04/2007   Hypothyroidism    Pneumonia    several times and again today (02/13/2104)   Renal insufficiency    Seizures (HCC)    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; none in the last couple years (02/12/2014)   Spinal stenosis      Significant Hospital Events: Including  procedures, antibiotic start and stop dates in addition to other pertinent events   9/23 admit w/ hypoxic resp failure 2/2 to Right sided pna  Interim History / Subjective:  Having worsening cough overnight Also increase o2 requirements this am up to 15L salter  Objective    Blood pressure (!) 163/70, pulse 78, temperature 98 F (36.7 C), temperature source Oral, resp. rate (!) 22, SpO2 98%.        Intake/Output Summary (Last 24 hours) at 04/11/2024 0805 Last data filed at 04/11/2024 0400 Gross per 24 hour  Intake 852.33 ml  Output --  Net 852.33 ml   There were no vitals filed for this visit.  Examination: General:  NAD HEENT: MM pink/moist; poor dentition; salter [place] in place Neuro: Aox3; MAE CV: s1s2, RRR, no m/r/g PULM: dim crackles on right; dim clear on left; 15 l/m sats 94% GI: soft, bsx4 active  Extremities: warm/dry, no edema     Resolved problem list   Assessment and Plan   Acute respiratory failure w/ hypoxia RML/RLL PNA -patient states he has been having concerns of difficulty swallowing and aspirating ever since his spinal surgeries. Has not been able to be seen by SLP yet  Hx of asthma -rvp and strep negative -exp sputum and legionella pending Plan: -transition to HHFNC and wean fio2 for sats >92% -recheck cxr given increase o2 requirements -cont rocephin zenovia -follow sputum culture and urine legionella -cont guaifenesin  -pulm toiletry: flutter/IS -pt/ot/oob -hold  3% nebs with worsening cough and sob; cont duoneb; triple therapy maintenance nebs -steroids per primary -slp eval for possible aspiration -PPI for gerd    Critical care time: NA      JD Emilio DEVONNA [facility] 04/11/2024, 8:05 AM  Please see [email or web address] for pager details.  From 7A-7P if no response, please call [phone]. After hours, please call ELink [phone].

## 2024-04-11 NOTE — Progress Notes (Signed)
 PT Cancellation Note  Patient Details Name: James [name] MRN: [id number] DOB: 1956-05-21   Cancelled Treatment:    Reason Eval/Treat Not Completed: Medical issues which prohibited therapy. Pt requiring increased amount of O2, didn't sleep well last night, fatigued and desating easily. RN requests therapy check back at later time. Will continue to follow for PT eval as schedule permits.   Tori Vennela Jutte PT, DPT 04/11/24, 10:26 AM

## 2024-04-11 NOTE — Progress Notes (Addendum)
 OT Cancellation Note  Patient Details Name: Timonthy Hovater Mcglothlin MRN: [id number] DOB: 1956/04/05   Cancelled Treatment:    Reason Eval/Treat Not Completed: Medical issues which prohibited therapy. Nurse requested for therapy to hold today., due to pt presenting with fatigue, requiring increased supplemental O2, O2 desaturation, and general malaise. Will follow-up at later date, as appropriate.   Delanna JINNY Lesches, OTR/L 04/11/2024, 11:52 AM

## 2024-04-11 NOTE — Progress Notes (Signed)
 BSE completed, full report to follow.  Pt with on a large amount of oxygen  at this time - and reports feeling slightly dyspneic at rest.  He admits to h/o dysphagia with largest complaint being coughing and then gagging consuming solids - twice within the last few months.   SLP reviewed prior MBS studies x1 (2034, 2024) as well as prior esophagram (2021) using teach back and written instructions for reinforcement of helpful compensation strategies per most recent MBS.  Chin tuck and limited thin to tsp amounts prevented aspirated.     CN exam revealed mild deviation of soft palate to right upon phonation - ? Component of vagus nerve involvement- ? Otherwise unremarkable.  Pt admits to chronic dysphagia which he admits has worsened over time and he has to be careful with eating.    Reports poor appetite with weight loss recently due to life stressors - Admits to h/o depression and recently having approximately ETOH drinks a week (after not having consumed ETOH for years).  Pt observed consuming diet ginger-ale via straw with chin tuck posture - no s/s of aspiration noted.    He politely declined to consume any more po intake.  Recommend consider engaging RD to help maximize po intake - ? If pt would benefit from liquid supplement ? Glucerna? Given very poor po intake PTA.   Will follow up to provide pt with exercises to improve laryngeal closure/elevation/epiglottic deflection to maximize swallow efficiency with po intake - Pt agreeable to plan.    Madelin POUR, MS [facility] SLP Acute [employer] [phone]

## 2024-04-11 NOTE — Progress Notes (Signed)
 PROGRESS NOTE    James [name]  FMW:[id number] DOB: [birth year] DOA: 04/10/2024 PCP: Nichole Senior, MD    Brief Narrative:  9 anxiety depression, asthma COPD, coronary artery disease, type 1 diabetes on insulin , diabetic retinopathy nephrolithiasis, hypothyroidism hyperlipidemia, multiple recurrent pneumonia presented to ER with progressive worsening shortness of breath associated with productive cough, pleuritic chest pain, wheezing fatigue persistent despite outpatient treatment with antibiotics and steroids.  In the emergency room, WBC count 8.3.  COVID, influenza and RSV negative.  Potassium 3.3.  Chest x-ray and CT angiogram with middle and the right lower lobe pulmonary consolidation consistent with pneumonia.  Initially on 6 L of oxygen .  Admitted with pulmonary consultation.  Subjective: Patient seen and examined.  Feels tired.  Did not sleep well last night.  Trazodone  did not work.  Able to talk with some interruption.  He has intermittent cough.  Afebrile overnight.  Was on 8 L oxygen  on my exam-changed to heated high flow. Assessment & Plan:   Community-acquired pneumonia, right middle and lower lobe pneumonia failed outpatient therapy Acute hypoxemic respiratory failure secondary to COPD exacerbation due to pneumonia.  Continue monitoring in the stepdown unit, on high flow oxygen .   Aggressive bronchodilator therapy, oral steroids, inhalational steroids, scheduled and as needed bronchodilators, deep breathing exercises, incentive spirometry, chest physiotherapy. Blood cultures, strep and Legionella antigen.  Sputum cultures. Currently on Rocephin  and azithromycin .  Will continue. Supplemental oxygen  to keep saturations more than 90%. Appreciate pulmonary input. Mobilize with PT OT today.  Hypokalemia, replaced and hyperkalemia: Overcorrected.  Recheck tomorrow morning.  Stop further potassium supplementation.  Type 1 diabetes on insulin , uncontrolled with hyperglycemia secondary  to steroid use: At home on Lantus  27 units,-increasing to 40 units while on steroids On carb count at home, start prandial insulin  as well as sliding scale insulin .  Chronic medical issues including Hyperlipidemia, on a statin to continue Depression and insomnia, on trazodone .  Inadequate symptom relief.  Will add Ativan  for nighttime sleep and anxiety. History of coronary artery disease, on aspirin  and simvastatin .  Stable. GERD, on Protonix . BPH, on finasteride  and Flomax . Hypothyroidism, on Synthroid .     DVT prophylaxis: enoxaparin  (LOVENOX ) injection 40 mg Start: 04/10/24 2200   Code Status: Full code Family Communication: None at the bedside Disposition Plan: Status is: Inpatient Remains inpatient appropriate because: Severe significant symptoms     Consultants:  PCCM  Procedures:  None  Antimicrobials:  Rocephin  azithromycin  9/23---     Objective: Vitals:   04/11/24 0338 04/11/24 0400 04/11/24 0500 04/11/24 0600  BP:  (!) 163/52 (!) 166/77 (!) 163/70  Pulse:  (!) 54 93 78  Resp:  (!) 25 17 (!) 22  Temp: 98 F (36.7 C)     TempSrc: Oral     SpO2:  94% 93% 98%    Intake/Output Summary (Last 24 hours) at 04/11/2024 1037 Last data filed at 04/11/2024 0400 Gross per 24 hour  Intake 852.33 ml  Output --  Net 852.33 ml   There were no vitals filed for this visit.  Examination:  General exam: Appears mildly anxious.  Comfortable on high flow oxygen . Respiratory system: Poor inspiratory effort.  Poor air entry mostly on the right base. SpO2: 98 % O2 Flow Rate (L/min): (S) 40 L/min FiO2 (%): (S) 65 %  Cardiovascular system: S1 & S2 heard, RRR. No JVD, murmurs, rubs, gallops or clicks. No pedal edema. Gastrointestinal system: Abdomen is nondistended, soft and nontender. No organomegaly or masses felt. Normal bowel sounds  heard. Central nervous system: Alert and oriented. No focal neurological deficits. Extremities: Symmetric 5 x 5 power. Skin: No rashes,  lesions or ulcers Psychiatry: Judgement and insight appear normal. Mood & affect appropriate.     Data Reviewed: I have personally reviewed following labs and imaging studies  CBC: Recent Labs  Lab 04/10/24 1040 04/11/24 0320  WBC 8.3 9.1  NEUTROABS 5.8  --   HGB 13.0 12.1*  HCT 39.3 35.6*  MCV 90.1 90.8  PLT 431* 441*   Basic Metabolic Panel: Recent Labs  Lab 04/10/24 1040 04/11/24 0320  NA 134* 134*  K 3.3* 5.8*  CL 98 101  CO2 20* 21*  GLUCOSE 253* 232*  BUN 14 14  CREATININE 0.80 0.74  CALCIUM 8.9 9.0  MG 2.0  --   PHOS 2.0*  --    GFR: Estimated Creatinine Clearance: 87.1 mL/min (by C-G formula based on SCr of 0.74 mg/dL). Liver Function Tests: Recent Labs  Lab 04/11/24 0320  AST <10*  ALT 7  ALKPHOS 99  BILITOT 0.4  PROT 6.5  ALBUMIN 3.2*   No results for input(s): LIPASE, AMYLASE in the last 168 hours. No results for input(s): AMMONIA in the last 168 hours. Coagulation Profile: No results for input(s): INR, PROTIME in the last 168 hours. Cardiac Enzymes: No results for input(s): CKTOTAL, CKMB, CKMBINDEX, TROPONINI in the last 168 hours. BNP (last 3 results) No results for input(s): PROBNP in the last 8760 hours. HbA1C: No results for input(s): HGBA1C in the last 72 hours. CBG: Recent Labs  Lab 04/10/24 1513 04/10/24 1709 04/10/24 2114 04/11/24 0057 04/11/24 0749  GLUCAP 290* 251* 228* 190* 300*   Lipid Profile: No results for input(s): CHOL, HDL, LDLCALC, TRIG, CHOLHDL, LDLDIRECT in the last 72 hours. Thyroid  Function Tests: No results for input(s): TSH, T4TOTAL, FREET4, T3FREE, THYROIDAB in the last 72 hours. Anemia Panel: No results for input(s): VITAMINB12, FOLATE, FERRITIN, TIBC, IRON, RETICCTPCT in the last 72 hours. Sepsis Labs: No results for input(s): PROCALCITON, LATICACIDVEN in the last 168 hours.  Recent Results (from the past 240 hours)  Resp panel by RT-PCR  (RSV, Flu A&B, Covid) Anterior Nasal Swab     Status: None   Collection Time: 04/10/24 10:31 AM   Specimen: Anterior Nasal Swab  Result Value Ref Range Status   SARS Coronavirus 2 by RT PCR NEGATIVE NEGATIVE Final    Comment: (NOTE) SARS-CoV-2 target nucleic acids are NOT DETECTED.  The SARS-CoV-2 RNA is generally detectable in upper respiratory specimens during the acute phase of infection. The lowest concentration of SARS-CoV-2 viral copies this assay can detect is 138 copies/mL. A negative result does not preclude SARS-Cov-2 infection and should not be used as the sole basis for treatment or other patient management decisions. A negative result may occur with  improper specimen collection/handling, submission of specimen other than nasopharyngeal swab, presence of viral mutation(s) within the areas targeted by this assay, and inadequate number of viral copies(<138 copies/mL). A negative result must be combined with clinical observations, patient history, and epidemiological information. The expected result is Negative.  Fact Sheet for Patients:  BloggerCourse.com  Fact Sheet for Healthcare Providers:  SeriousBroker.it  This test is no t yet approved or cleared by the [place]  FDA and  has been authorized for detection and/or diagnosis of SARS-CoV-2 by FDA under an Emergency Use Authorization (EUA). This EUA will remain  in effect (meaning this test can be used) for the duration of the COVID-19 declaration under Section 564(b)(1)  of the Act, 21 U.S.C.section 360bbb-3(b)(1), unless the authorization is terminated  or revoked sooner.       Influenza A by PCR NEGATIVE NEGATIVE Final   Influenza B by PCR NEGATIVE NEGATIVE Final    Comment: (NOTE) The Xpert Xpress SARS-CoV-2/FLU/RSV plus assay is intended as an aid in the diagnosis of influenza from Nasopharyngeal swab specimens and should not be used as a sole basis for  treatment. Nasal washings and aspirates are unacceptable for Xpert Xpress SARS-CoV-2/FLU/RSV testing.  Fact Sheet for Patients: BloggerCourse.com  Fact Sheet for Healthcare Providers: SeriousBroker.it  This test is not yet approved or cleared by the [place]  FDA and has been authorized for detection and/or diagnosis of SARS-CoV-2 by FDA under an Emergency Use Authorization (EUA). This EUA will remain in effect (meaning this test can be used) for the duration of the COVID-19 declaration under Section 564(b)(1) of the Act, 21 U.S.C. section 360bbb-3(b)(1), unless the authorization is terminated or revoked.     Resp Syncytial Virus by PCR NEGATIVE NEGATIVE Final    Comment: (NOTE) Fact Sheet for Patients: BloggerCourse.com  Fact Sheet for Healthcare Providers: SeriousBroker.it  This test is not yet approved or cleared by the [place]  FDA and has been authorized for detection and/or diagnosis of SARS-CoV-2 by FDA under an Emergency Use Authorization (EUA). This EUA will remain in effect (meaning this test can be used) for the duration of the COVID-19 declaration under Section 564(b)(1) of the Act, 21 U.S.C. section 360bbb-3(b)(1), unless the authorization is terminated or revoked.  Performed at [facility], [address], [place], [place] [address]   MRSA Next Gen by PCR, Nasal     Status: None   Collection Time: 04/10/24  3:11 PM   Specimen: Nasal Mucosa; Nasal Swab  Result Value Ref Range Status   MRSA by PCR Next Gen NOT DETECTED NOT DETECTED Final    Comment: (NOTE) The GeneXpert MRSA Assay (FDA approved for NASAL specimens only), is one component of a comprehensive MRSA colonization surveillance program. It is not intended to diagnose MRSA infection nor to guide or monitor treatment for MRSA infections. Test performance is not FDA approved  in patients less than 77 years old. Performed at [facility], [address] [place], [place] [address]   Respiratory (~20 pathogens) panel by PCR     Status: None   Collection Time: 04/10/24  6:32 PM   Specimen: Nasopharyngeal Swab; Respiratory  Result Value Ref Range Status   Adenovirus NOT DETECTED NOT DETECTED Final   Coronavirus 229E NOT DETECTED NOT DETECTED Final    Comment: (NOTE) The Coronavirus on the Respiratory Panel, DOES NOT test for the novel  Coronavirus (2019 nCoV)    Coronavirus HKU1 NOT DETECTED NOT DETECTED Final   Coronavirus NL63 NOT DETECTED NOT DETECTED Final   Coronavirus OC43 NOT DETECTED NOT DETECTED Final   Metapneumovirus NOT DETECTED NOT DETECTED Final   Rhinovirus / Enterovirus NOT DETECTED NOT DETECTED Final   Influenza A NOT DETECTED NOT DETECTED Final   Influenza B NOT DETECTED NOT DETECTED Final   Parainfluenza Virus 1 NOT DETECTED NOT DETECTED Final   Parainfluenza Virus 2 NOT DETECTED NOT DETECTED Final   Parainfluenza Virus 3 NOT DETECTED NOT DETECTED Final   Parainfluenza Virus 4 NOT DETECTED NOT DETECTED Final   Respiratory Syncytial Virus NOT DETECTED NOT DETECTED Final   Bordetella pertussis NOT DETECTED NOT DETECTED Final   Bordetella Parapertussis NOT DETECTED NOT DETECTED Final   Chlamydophila  pneumoniae NOT DETECTED NOT DETECTED Final   Mycoplasma pneumoniae NOT DETECTED NOT DETECTED Final    Comment: Performed at [facility], [address] [place], [place] [address]  Expectorated Sputum Assessment w Gram Stain, Rflx to Resp Cult     Status: None   Collection Time: 04/11/24 12:59 AM   Specimen: Expectorated Sputum  Result Value Ref Range Status   Specimen Description EXPECTORATED SPUTUM  Final   Special Requests EXPECTORATED SPUTUM  Final   Sputum evaluation   Final    THIS SPECIMEN IS ACCEPTABLE FOR SPUTUM CULTURE Performed at [facility], [address] [place], [place]  [address]    Report Status 04/11/2024 FINAL  Final  Culture, Respiratory w Gram Stain     Status: None (Preliminary result)   Collection Time: 04/11/24 12:59 AM  Result Value Ref Range Status   Specimen Description   Final    EXPECTORATED SPUTUM Performed at [facility], [address] [place], [place] [address]    Special Requests   Final    EXPECTORATED SPUTUM Reflexed from [id number] Performed at [facility], [address] [place], [place] [address]    Gram Stain   Final    FEW WBC PRESENT, PREDOMINANTLY PMN FEW GRAM POSITIVE COCCI FEW GRAM NEGATIVE RODS Performed at [facility], [address], [place], [place] [address]    Culture PENDING  Incomplete   Report Status PENDING  Incomplete         Radiology Studies: DG CHEST PORT 1 VIEW Result Date: 04/11/2024 CLINICAL DATA:  Acute respiratory failure with hypoxia. EXAM: PORTABLE CHEST 1 VIEW COMPARISON:  Chest x-ray 04/10/2024, CT 04/10/2024 FINDINGS: Lungs are adequately inflated and demonstrate stable opacification over the right lung base compatible with known pneumonia. No significant effusion. Left lung is clear. Cardiomediastinal silhouette and remainder of the exam is unchanged. IMPRESSION: Stable right basilar pneumonia. Electronically Signed   By: Toribio Agreste M.D.   On: 04/11/2024 08:54   CT Angio Chest PE W/Cm &/Or Wo Cm Result Date: 04/10/2024 CLINICAL DATA:  Pulmonary embolism (PE) suspected, high prob reports of SHOB and cough. Denies fevers. Pts RA sat 80%. Pt placed on 15L high flow. EXAM: CT ANGIOGRAPHY CHEST WITH CONTRAST TECHNIQUE: Multidetector CT imaging of the chest was performed using the standard protocol during bolus administration of intravenous contrast. Multiplanar CT image reconstructions and MIPs were obtained to evaluate the vascular anatomy. RADIATION DOSE REDUCTION: This exam was performed according to the departmental dose-optimization program which  includes automated exposure control, adjustment of the mA and/or kV according to patient size and/or use of iterative reconstruction technique. CONTRAST:  75mL OMNIPAQUE  IOHEXOL  350 MG/ML SOLN COMPARISON:  CHEST XR, 04/10/2024. CT CHEST, 07/18/2023. CTA PE, 06/04/2022. FINDINGS: Suboptimal evaluation, secondary to motion degradation. Cardiovascular: Satisfactory opacification of the pulmonary arteries to the segmental level. No segmental or larger pulmonary embolus. Normal heart size. No pericardial effusion. Severe burden of calcified multivessel coronary atherosclerosis, greatest within the LAD. Mediastinum/Nodes: No enlarged mediastinal, hilar, or axillary lymph nodes. Thyroid  gland, trachea, and esophagus demonstrate no significant findings. Lungs/Pleura: Bronchovascular pulmonary opacities involving the middle and RIGHT lower lobes. The LEFT lung is clear. No pleural effusion or pneumothorax. Upper Abdomen: No acute abnormality. Musculoskeletal: No acute chest wall abnormality. No acute osseous findings. Review of the MIP images confirms the above findings. IMPRESSION: 1. No segmental or larger pulmonary embolus. 2. Middle and RIGHT lower lobe pulmonary consolidation, consistent with pneumonia.  Electronically Signed   By: Thom Hall M.D.   On: 04/10/2024 12:49   DG Chest Port 1 View Result Date: 04/10/2024 CLINICAL DATA:  Shortness of breath. EXAM: PORTABLE CHEST 1 VIEW COMPARISON:  02/13/2024 FINDINGS: The cardiac silhouette, mediastinal and hilar contours are within normal limits and stable. Significant airspace opacity in the right lower lobe consistent with pneumonia. There is an associated small parapneumonic effusion. The left lung is clear. The bony structures are intact. IMPRESSION: Right lower lobe pneumonia with small parapneumonic effusion. Electronically Signed   By: MYRTIS Stammer M.D.   On: 04/10/2024 11:10        Scheduled Meds:  [START ON 04/13/2024] arformoterol   15 mcg Nebulization  Q12H   aspirin  EC  81 mg Oral QHS   budesonide  (PULMICORT ) nebulizer solution  0.5 mg Nebulization BID   Chlorhexidine  Gluconate Cloth  6 each Topical Daily   enoxaparin  (LOVENOX ) injection  40 mg Subcutaneous Q24H   finasteride   5 mg Oral QHS   guaiFENesin   10 mL Oral QID   insulin  aspart  0-20 Units Subcutaneous TID WC   insulin  aspart  0-5 Units Subcutaneous QHS   insulin  aspart  8 Units Subcutaneous TID WC   insulin  glargine  40 Units Subcutaneous Daily   ipratropium-albuterol   3 mL Nebulization Q4H   levothyroxine   100 mcg Oral QHS   loratadine   10 mg Oral QHS   metoCLOPramide   5 mg Oral BID AC   pantoprazole   40 mg Oral QHS   predniSONE   40 mg Oral Q breakfast   [START ON 04/13/2024] revefenacin   175 mcg Nebulization Daily   simvastatin   40 mg Oral Daily   tamsulosin   0.4 mg Oral QHS   Continuous Infusions:  azithromycin      cefTRIAXone  (ROCEPHIN )  IV     lactated ringers  100 mL/hr at 04/11/24 0054     LOS: 1 Gjerde    Time spent: 52 minutes    Renato Applebaum, MD [facility]

## 2024-04-12 DIAGNOSIS — J9601 Acute respiratory failure with hypoxia: Secondary | ICD-10-CM | POA: Diagnosis not present

## 2024-04-12 LAB — COMPREHENSIVE METABOLIC PANEL WITH GFR
ALT: 9 U/L (ref 0–44)
AST: 21 U/L (ref 15–41)
Albumin: 3.2 g/dL — ABNORMAL LOW (ref 3.5–5.0)
Alkaline Phosphatase: 92 U/L (ref 38–126)
Anion gap: 15 (ref 5–15)
BUN: 17 mg/dL (ref 8–23)
CO2: 22 mmol/L (ref 22–32)
Calcium: 8.8 mg/dL — ABNORMAL LOW (ref 8.9–10.3)
Chloride: 104 mmol/L (ref 98–111)
Creatinine, Ser: 0.77 mg/dL (ref 0.61–1.24)
GFR, Estimated: >60 mL/min (ref >60)
Glucose, Bld: 102 mg/dL — ABNORMAL HIGH (ref 70–99)
Potassium: 3.3 mmol/L — ABNORMAL LOW (ref 3.5–5.1)
Sodium: 140 mmol/L (ref 135–145)
Total Bilirubin: 0.3 mg/dL (ref 0.0–1.2)
Total Protein: 6.2 g/dL — ABNORMAL LOW (ref 6.5–8.1)

## 2024-04-12 LAB — CBC WITH DIFFERENTIAL/PLATELET
Abs Immature Granulocytes: 0.05 K/uL (ref 0.00–0.07)
Basophils Absolute: 0 K/uL (ref 0.0–0.1)
Basophils Relative: 0 %
Eosinophils Absolute: 0 K/uL (ref 0.0–0.5)
Eosinophils Relative: 0 %
HCT: 34.9 % — ABNORMAL LOW (ref 39.0–52.0)
Hemoglobin: 11.9 g/dL — ABNORMAL LOW (ref 13.0–17.0)
Immature Granulocytes: 0 %
Lymphocytes Relative: 12 %
Lymphs Abs: 1.5 K/uL (ref 0.7–4.0)
MCH: 31.1 pg (ref 26.0–34.0)
MCHC: 34.1 g/dL (ref 30.0–36.0)
MCV: 91.1 fL (ref 80.0–100.0)
Monocytes Absolute: 0.7 K/uL (ref 0.1–1.0)
Monocytes Relative: 6 %
Neutro Abs: 9.9 K/uL — ABNORMAL HIGH (ref 1.7–7.7)
Neutrophils Relative %: 82 %
Platelets: 451 K/uL — ABNORMAL HIGH (ref 150–400)
RBC: 3.83 MIL/uL — ABNORMAL LOW (ref 4.22–5.81)
RDW: 12.1 % (ref 11.5–15.5)
WBC: 12.3 K/uL — ABNORMAL HIGH (ref 4.0–10.5)
nRBC: 0 % (ref 0.0–0.2)

## 2024-04-12 LAB — GLUCOSE, CAPILLARY
Glucose-Capillary: 100 mg/dL — ABNORMAL HIGH (ref 70–99)
Glucose-Capillary: 130 mg/dL — ABNORMAL HIGH (ref 70–99)
Glucose-Capillary: 134 mg/dL — ABNORMAL HIGH (ref 70–99)
Glucose-Capillary: 134 mg/dL — ABNORMAL HIGH (ref 70–99)
Glucose-Capillary: 58 mg/dL — ABNORMAL LOW (ref 70–99)
Glucose-Capillary: 63 mg/dL — ABNORMAL LOW (ref 70–99)
Glucose-Capillary: 96 mg/dL (ref 70–99)

## 2024-04-12 LAB — PHOSPHORUS: Phosphorus: 4.2 mg/dL (ref 2.5–4.6)

## 2024-04-12 LAB — MAGNESIUM: Magnesium: 2.2 mg/dL (ref 1.7–2.4)

## 2024-04-12 MED ORDER — PIPERACILLIN-TAZOBACTAM 3.375 G IVPB
3.3750 g | Freq: Three times a day (TID) | INTRAVENOUS | Status: AC
  Administered 2024-04-12 – 2024-04-18 (×18): 3.375 g via INTRAVENOUS
  Filled 2024-04-12 (×18): qty 50

## 2024-04-12 MED ORDER — POTASSIUM CHLORIDE CRYS ER 20 MEQ PO TBCR
40.0000 meq | EXTENDED_RELEASE_TABLET | Freq: Every day | ORAL | Status: DC
  Administered 2024-04-12 – 2024-04-16 (×5): 40 meq via ORAL
  Filled 2024-04-12 (×5): qty 2

## 2024-04-12 MED ORDER — GUAIFENESIN ER 600 MG PO TB12
1200.0000 mg | ORAL_TABLET | Freq: Two times a day (BID) | ORAL | Status: DC
Start: 2024-04-12 — End: 2024-04-18
  Administered 2024-04-12 – 2024-04-18 (×13): 1200 mg via ORAL
  Filled 2024-04-12 (×13): qty 2

## 2024-04-12 MED ORDER — GUAIFENESIN-CODEINE 100-10 MG/5ML PO SOLN
10.0000 mL | ORAL | Status: DC | PRN
  Administered 2024-04-12 – 2024-04-13 (×7): 10 mL via ORAL
  Filled 2024-04-12 (×9): qty 10

## 2024-04-12 MED ORDER — INFLUENZA VAC SPLIT HIGH-DOSE 0.5 ML IM SUSY
0.5000 mL | PREFILLED_SYRINGE | INTRAMUSCULAR | Status: AC
  Administered 2024-04-13: 0.5 mL via INTRAMUSCULAR
  Filled 2024-04-12: qty 0.5

## 2024-04-12 MED ORDER — FUROSEMIDE 10 MG/ML IJ SOLN
40.0000 mg | Freq: Once | INTRAMUSCULAR | Status: AC
  Administered 2024-04-12: 40 mg via INTRAVENOUS
  Filled 2024-04-12: qty 4

## 2024-04-12 NOTE — Progress Notes (Signed)
 PROGRESS NOTE    Kairo Laubacher Dizdarevic  FMW:[id number] DOB: 1956-02-05 DOA: 04/10/2024 PCP: Nichole Senior, MD    Brief Narrative:  50 anxiety depression, asthma COPD, coronary artery disease, type 1 diabetes on insulin , diabetic retinopathy nephrolithiasis, hypothyroidism hyperlipidemia, multiple recurrent pneumonia presented to ER with progressive worsening shortness of breath associated with productive cough, pleuritic chest pain, wheezing fatigue persistent despite outpatient treatment with antibiotics and steroids.  In the emergency room, WBC count 8.3.  COVID, influenza and RSV negative.  Potassium 3.3.  Chest x-ray and CT angiogram with middle and the right lower lobe pulmonary consolidation consistent with pneumonia.  Initially on 6 L of oxygen .  Admitted with pulmonary consultation.  Subjective: Patient seen and examined.  He has some chest discomfort and back discomfort because of laying in the bed.  Overnight he rested on heated high flow.  Ativan  helped him to relax last night. Patient was retaining more than 1 L of urine, Foley catheter was placed. He is anticipating to get out of the bed today.  Afebrile. Cough with streaky hemoptysis.  Assessment & Plan:   Community-acquired pneumonia, right middle and lower lobe pneumonia failed outpatient therapy Acute hypoxemic respiratory failure secondary to COPD exacerbation due to pneumonia.  bronchodilator therapy, oral steroids, inhalational steroids, scheduled and as needed bronchodilators, deep breathing exercises, incentive spirometry, chest physiotherapy. Blood cultures, strep and Legionella antigen.  Sputum cultures.  Negative so far. Currently on Rocephin  and azithromycin .  Will continue. Supplemental oxygen  to keep saturations more than 90%. Appreciate pulmonary input. Anticipate mobilizing today. Will start patient on scheduled Mucinex , changing cough medication to guaifenesin  and codeine .  Hypokalemia: Persistent.  Keep on a  scheduled replacement and monitor.  Type 1 diabetes on insulin , uncontrolled with hyperglycemia secondary to steroid use: At home on Lantus  27 units,-increasing to 40 units while on steroids On carb count at home, currently on NovoLog  8 units with meals along with sliding scale coverage.   Stable today.    BPH with urinary retention due to acute illness: Continue Foley catheter.  Will give voiding trial only when he is able to mobilize around independently.  Continue finasteride  and Flomax .  May go home with catheter with outpatient voiding trial if not successful.  Chronic medical issues including Hyperlipidemia, on a statin to continue Depression and insomnia, on trazodone .  Inadequate symptom relief.  Changed to Ativan  per the hospital. History of coronary artery disease, on aspirin  and simvastatin .  Stable. GERD, on Protonix . Hypothyroidism, on Synthroid .     DVT prophylaxis: enoxaparin  (LOVENOX ) injection 40 mg Start: 04/10/24 2200   Code Status: Full code Family Communication: None at the bedside, updated patient's wife last night. Disposition Plan: Status is: Inpatient Remains inpatient appropriate because: Severe significant symptoms, on high flow oxygen .     Consultants:  PCCM  Procedures:  None  Antimicrobials:  Rocephin  azithromycin  9/23---     Objective: Vitals:   04/12/24 0332 04/12/24 0400 04/12/24 0512 04/12/24 0600  BP:  (!) 177/71  (!) 131/56  Pulse:  76  85  Resp:  (!) 23  19  Temp: 98.4 F (36.9 C)     TempSrc: Oral     SpO2:  92% 90%     Intake/Output Summary (Last 24 hours) at 04/12/2024 0745 Last data filed at 04/11/2024 2300 Gross per 24 hour  Intake 2109.54 ml  Output 1150 ml  Net 959.54 ml   There were no vitals filed for this visit.  Examination:  General exam: Looks fairly comfortable  and able to talk in complete sentences. Respiratory system: Poor inspiratory effort.  No added sounds. SpO2: 90 % O2 Flow Rate (L/min): 35  L/min FiO2 (%): 50 %  Cardiovascular system: S1 & S2 heard, RRR. No JVD, murmurs, rubs, gallops or clicks. No pedal edema. Gastrointestinal system: Abdomen is nondistended, soft and nontender. No organomegaly or masses felt. Normal bowel sounds heard. Foley catheter with clear urine. Central nervous system: Alert and oriented. No focal neurological deficits. Extremities: Symmetric 5 x 5 power. Skin: No rashes, lesions or ulcers Psychiatry: Judgement and insight appear normal. Mood & affect appropriate.     Data Reviewed: I have personally reviewed following labs and imaging studies  CBC: Recent Labs  Lab 04/10/24 1040 04/11/24 0320 04/12/24 0320  WBC 8.3 9.1 12.3*  NEUTROABS 5.8  --  9.9*  HGB 13.0 12.1* 11.9*  HCT 39.3 35.6* 34.9*  MCV 90.1 90.8 91.1  PLT 431* 441* 451*   Basic Metabolic Panel: Recent Labs  Lab 04/10/24 1040 04/11/24 0320 04/12/24 0320  NA 134* 134* 140  K 3.3* 5.8* 3.3*  CL 98 101 104  CO2 20* 21* 22  GLUCOSE 253* 232* 102*  BUN 14 14 17   CREATININE 0.80 0.74 0.77  CALCIUM 8.9 9.0 8.8*  MG 2.0  --  2.2  PHOS 2.0*  --  4.2   GFR: Estimated Creatinine Clearance: 87.1 mL/min (by C-G formula based on SCr of 0.77 mg/dL). Liver Function Tests: Recent Labs  Lab 04/11/24 0320 04/12/24 0320  AST <10* 21  ALT 7 9  ALKPHOS 99 92  BILITOT 0.4 0.3  PROT 6.5 6.2*  ALBUMIN 3.2* 3.2*   No results for input(s): LIPASE, AMYLASE in the last 168 hours. No results for input(s): AMMONIA in the last 168 hours. Coagulation Profile: No results for input(s): INR, PROTIME in the last 168 hours. Cardiac Enzymes: No results for input(s): CKTOTAL, CKMB, CKMBINDEX, TROPONINI in the last 168 hours. BNP (last 3 results) No results for input(s): PROBNP in the last 8760 hours. HbA1C: No results for input(s): HGBA1C in the last 72 hours. CBG: Recent Labs  Lab 04/11/24 0749 04/11/24 1211 04/11/24 1623 04/11/24 2141 04/12/24 0320  GLUCAP  300* 193* 161* 175* 100*   Lipid Profile: No results for input(s): CHOL, HDL, LDLCALC, TRIG, CHOLHDL, LDLDIRECT in the last 72 hours. Thyroid  Function Tests: No results for input(s): TSH, T4TOTAL, FREET4, T3FREE, THYROIDAB in the last 72 hours. Anemia Panel: No results for input(s): VITAMINB12, FOLATE, FERRITIN, TIBC, IRON, RETICCTPCT in the last 72 hours. Sepsis Labs: No results for input(s): PROCALCITON, LATICACIDVEN in the last 168 hours.  Recent Results (from the past 240 hours)  Resp panel by RT-PCR (RSV, Flu A&B, Covid) Anterior Nasal Swab     Status: None   Collection Time: 04/10/24 10:31 AM   Specimen: Anterior Nasal Swab  Result Value Ref Range Status   SARS Coronavirus 2 by RT PCR NEGATIVE NEGATIVE Final    Comment: (NOTE) SARS-CoV-2 target nucleic acids are NOT DETECTED.  The SARS-CoV-2 RNA is generally detectable in upper respiratory specimens during the acute phase of infection. The lowest concentration of SARS-CoV-2 viral copies this assay can detect is 138 copies/mL. A negative result does not preclude SARS-Cov-2 infection and should not be used as the sole basis for treatment or other patient management decisions. A negative result may occur with  improper specimen collection/handling, submission of specimen other than nasopharyngeal swab, presence of viral mutation(s) within the areas targeted by this assay, and inadequate  number of viral copies(<138 copies/mL). A negative result must be combined with clinical observations, patient history, and epidemiological information. The expected result is Negative.  Fact Sheet for Patients:  BloggerCourse.com  Fact Sheet for Healthcare Providers:  SeriousBroker.it  This test is no t yet approved or cleared by the [place]  FDA and  has been authorized for detection and/or diagnosis of SARS-CoV-2 by FDA under an Emergency Use  Authorization (EUA). This EUA will remain  in effect (meaning this test can be used) for the duration of the COVID-19 declaration under Section 564(b)(1) of the Act, 21 U.S.C.section 360bbb-3(b)(1), unless the authorization is terminated  or revoked sooner.       Influenza A by PCR NEGATIVE NEGATIVE Final   Influenza B by PCR NEGATIVE NEGATIVE Final    Comment: (NOTE) The Xpert Xpress SARS-CoV-2/FLU/RSV plus assay is intended as an aid in the diagnosis of influenza from Nasopharyngeal swab specimens and should not be used as a sole basis for treatment. Nasal washings and aspirates are unacceptable for Xpert Xpress SARS-CoV-2/FLU/RSV testing.  Fact Sheet for Patients: BloggerCourse.com  Fact Sheet for Healthcare Providers: SeriousBroker.it  This test is not yet approved or cleared by the [place]  FDA and has been authorized for detection and/or diagnosis of SARS-CoV-2 by FDA under an Emergency Use Authorization (EUA). This EUA will remain in effect (meaning this test can be used) for the duration of the COVID-19 declaration under Section 564(b)(1) of the Act, 21 U.S.C. section 360bbb-3(b)(1), unless the authorization is terminated or revoked.     Resp Syncytial Virus by PCR NEGATIVE NEGATIVE Final    Comment: (NOTE) Fact Sheet for Patients: BloggerCourse.com  Fact Sheet for Healthcare Providers: SeriousBroker.it  This test is not yet approved or cleared by the [place]  FDA and has been authorized for detection and/or diagnosis of SARS-CoV-2 by FDA under an Emergency Use Authorization (EUA). This EUA will remain in effect (meaning this test can be used) for the duration of the COVID-19 declaration under Section 564(b)(1) of the Act, 21 U.S.C. section 360bbb-3(b)(1), unless the authorization is terminated or revoked.  Performed at [facility],  [address], [place], [place] [address]   MRSA Next Gen by PCR, Nasal     Status: None   Collection Time: 04/10/24  3:11 PM   Specimen: Nasal Mucosa; Nasal Swab  Result Value Ref Range Status   MRSA by PCR Next Gen NOT DETECTED NOT DETECTED Final    Comment: (NOTE) The GeneXpert MRSA Assay (FDA approved for NASAL specimens only), is one component of a comprehensive MRSA colonization surveillance program. It is not intended to diagnose MRSA infection nor to guide or monitor treatment for MRSA infections. Test performance is not FDA approved in patients less than 75 years old. Performed at [facility], [address] [place], [place] [address]   Respiratory (~20 pathogens) panel by PCR     Status: None   Collection Time: 04/10/24  6:32 PM   Specimen: Nasopharyngeal Swab; Respiratory  Result Value Ref Range Status   Adenovirus NOT DETECTED NOT DETECTED Final   Coronavirus 229E NOT DETECTED NOT DETECTED Final    Comment: (NOTE) The Coronavirus on the Respiratory Panel, DOES NOT test for the novel  Coronavirus (2019 nCoV)    Coronavirus HKU1 NOT DETECTED NOT DETECTED Final   Coronavirus NL63 NOT DETECTED NOT DETECTED Final   Coronavirus OC43 NOT DETECTED NOT DETECTED Final   Metapneumovirus NOT DETECTED NOT DETECTED Final   Rhinovirus /  Enterovirus NOT DETECTED NOT DETECTED Final   Influenza A NOT DETECTED NOT DETECTED Final   Influenza B NOT DETECTED NOT DETECTED Final   Parainfluenza Virus 1 NOT DETECTED NOT DETECTED Final   Parainfluenza Virus 2 NOT DETECTED NOT DETECTED Final   Parainfluenza Virus 3 NOT DETECTED NOT DETECTED Final   Parainfluenza Virus 4 NOT DETECTED NOT DETECTED Final   Respiratory Syncytial Virus NOT DETECTED NOT DETECTED Final   Bordetella pertussis NOT DETECTED NOT DETECTED Final   Bordetella Parapertussis NOT DETECTED NOT DETECTED Final   Chlamydophila pneumoniae NOT DETECTED NOT DETECTED Final   Mycoplasma pneumoniae NOT  DETECTED NOT DETECTED Final    Comment: Performed at [facility], [address] [place], [place] [address]  Expectorated Sputum Assessment w Gram Stain, Rflx to Resp Cult     Status: None   Collection Time: 04/11/24 12:59 AM   Specimen: Expectorated Sputum  Result Value Ref Range Status   Specimen Description EXPECTORATED SPUTUM  Final   Special Requests EXPECTORATED SPUTUM  Final   Sputum evaluation   Final    THIS SPECIMEN IS ACCEPTABLE FOR SPUTUM CULTURE Performed at [facility], [address] [place], [place] [address]    Report Status 04/11/2024 FINAL  Final  Culture, Respiratory w Gram Stain     Status: None (Preliminary result)   Collection Time: 04/11/24 12:59 AM  Result Value Ref Range Status   Specimen Description   Final    EXPECTORATED SPUTUM Performed at [facility], [address] [place], [place] [address]    Special Requests   Final    EXPECTORATED SPUTUM Reflexed from [id number] Performed at [facility], [address], [place], [place] [address]    Gram Stain   Final    FEW WBC PRESENT, PREDOMINANTLY PMN FEW GRAM POSITIVE COCCI FEW GRAM NEGATIVE RODS Performed at [facility], [address] [place], [place] [address]    Culture PENDING  Incomplete   Report Status PENDING  Incomplete         Radiology Studies: DG CHEST PORT 1 VIEW Result Date: 04/11/2024 CLINICAL DATA:  Acute respiratory failure with hypoxia. EXAM: PORTABLE CHEST 1 VIEW COMPARISON:  Chest x-ray 04/10/2024, CT 04/10/2024 FINDINGS: Lungs are adequately inflated and demonstrate stable opacification over the right lung base compatible with known pneumonia. No significant effusion. Left lung is clear. Cardiomediastinal silhouette and remainder of the exam is unchanged. IMPRESSION: Stable right basilar pneumonia. Electronically Signed   By: Toribio Agreste M.D.   On: 04/11/2024 08:54   CT Angio Chest PE W/Cm &/Or Wo  Cm Result Date: 04/10/2024 CLINICAL DATA:  Pulmonary embolism (PE) suspected, high prob reports of SHOB and cough. Denies fevers. Pts RA sat 80%. Pt placed on 15L high flow. EXAM: CT ANGIOGRAPHY CHEST WITH CONTRAST TECHNIQUE: Multidetector CT imaging of the chest was performed using the standard protocol during bolus administration of intravenous contrast. Multiplanar CT image reconstructions and MIPs were obtained to evaluate the vascular anatomy. RADIATION DOSE REDUCTION: This exam was performed according to the departmental dose-optimization program which includes automated exposure control, adjustment of the mA and/or kV according to patient size and/or use of iterative reconstruction technique. CONTRAST:  75mL OMNIPAQUE  IOHEXOL  350 MG/ML SOLN COMPARISON:  CHEST XR, 04/10/2024. CT CHEST, 07/18/2023. CTA PE, 06/04/2022. FINDINGS: Suboptimal evaluation, secondary to motion degradation. Cardiovascular: Satisfactory opacification of the pulmonary arteries to the segmental level. No segmental or larger pulmonary embolus. Normal heart size. No  pericardial effusion. Severe burden of calcified multivessel coronary atherosclerosis, greatest within the LAD. Mediastinum/Nodes: No enlarged mediastinal, hilar, or axillary lymph nodes. Thyroid  gland, trachea, and esophagus demonstrate no significant findings. Lungs/Pleura: Bronchovascular pulmonary opacities involving the middle and RIGHT lower lobes. The LEFT lung is clear. No pleural effusion or pneumothorax. Upper Abdomen: No acute abnormality. Musculoskeletal: No acute chest wall abnormality. No acute osseous findings. Review of the MIP images confirms the above findings. IMPRESSION: 1. No segmental or larger pulmonary embolus. 2. Middle and RIGHT lower lobe pulmonary consolidation, consistent with pneumonia. Electronically Signed   By: Thom Hall M.D.   On: 04/10/2024 12:49   DG Chest Port 1 View Result Date: 04/10/2024 CLINICAL DATA:  Shortness of breath. EXAM:  PORTABLE CHEST 1 VIEW COMPARISON:  02/13/2024 FINDINGS: The cardiac silhouette, mediastinal and hilar contours are within normal limits and stable. Significant airspace opacity in the right lower lobe consistent with pneumonia. There is an associated small parapneumonic effusion. The left lung is clear. The bony structures are intact. IMPRESSION: Right lower lobe pneumonia with small parapneumonic effusion. Electronically Signed   By: MYRTIS Stammer M.D.   On: 04/10/2024 11:10        Scheduled Meds:  [START ON 04/13/2024] arformoterol   15 mcg Nebulization Q12H   aspirin  EC  81 mg Oral QHS   budesonide  (PULMICORT ) nebulizer solution  0.5 mg Nebulization BID   Chlorhexidine  Gluconate Cloth  6 each Topical Daily   enoxaparin  (LOVENOX ) injection  40 mg Subcutaneous Q24H   finasteride   5 mg Oral QHS   fluticasone   2 spray Each Nare BID   guaiFENesin   1,200 mg Oral BID   [START ON 04/13/2024] Influenza vac split trivalent PF  0.5 mL Intramuscular Tomorrow-1000   insulin  aspart  0-20 Units Subcutaneous TID WC   insulin  aspart  0-5 Units Subcutaneous QHS   insulin  aspart  8 Units Subcutaneous TID WC   insulin  glargine  40 Units Subcutaneous Daily   ipratropium-albuterol   3 mL Nebulization Q4H   levothyroxine   100 mcg Oral QHS   loratadine   10 mg Oral QHS   metoCLOPramide   5 mg Oral BID AC   oxymetazoline   1 spray Each Nare BID   pantoprazole   40 mg Oral QHS   potassium chloride   40 mEq Oral Daily   predniSONE   40 mg Oral Q breakfast   [START ON 04/13/2024] revefenacin   175 mcg Nebulization Daily   simvastatin   40 mg Oral Daily   tamsulosin   0.4 mg Oral QHS   Continuous Infusions:  azithromycin  Stopped (04/11/24 1429)   cefTRIAXone  (ROCEPHIN )  IV Stopped (04/11/24 1316)     LOS: 2 days    Time spent: 52 minutes    Renato Applebaum, MD [facility]

## 2024-04-12 NOTE — Evaluation (Signed)
 Physical Therapy Evaluation Patient Details Name: James [name] MRN: [id number] DOB: 03/10/1956 Today's Date: 04/12/2024  History of Present Illness  James [name] is a 68 y.o. male presents with SOB and cough; admitted 9/23 with hypoxic respiratory failure and pna.[name]:jwkpzub depression, asthma COPD, coronary artery disease, type 1 diabetes on insulin , diabetic retinopathy nephrolithiasis, hypothyroidism, hyperlipidemia, multiple recurrent pneumonia  Clinical Impression  Pt admitted with above diagnosis.  Pt currently with functional limitations due to the deficits listed below (see PT Problem List). Pt will benefit from acute skilled PT to increase their independence and safety with mobility to allow discharge.     The  patient agrees to mobilize to recliner, requires HHA  to steady/balance  to step to recliner. RR 37, SPo2 > 92%. Patient  maintained on 35L, 50% HHFNC. Patient limited in activity with SOB, Incr RR.   Patient tolerated well. Wife present.  If patient  progresses with decreased oxygen  needs, patient should be able to Dc home with HHPT.      If plan is discharge home, recommend the following: A little help with walking and/or transfers;A little help with bathing/dressing/bathroom;Assistance with cooking/housework;Assist for transportation;Help with stairs or ramp for entrance   Can travel by private vehicle        Equipment Recommendations None recommended by PT  Recommendations for Other Services       Functional Status Assessment Patient has had a recent decline in their functional status and demonstrates the ability to make significant improvements in function in a reasonable and predictable amount of time.     Precautions / Restrictions Precautions Precaution/Restrictions Comments: on HHFNC, Restrictions Weight Bearing Restrictions Per Provider Order: No      Mobility  Bed Mobility Overal bed mobility: Needs Assistance Bed Mobility: Supine to Sit     Supine to  sit: Supervision     General bed mobility comments: lines and  tubes    Transfers Overall transfer level: Needs assistance Equipment used: 1 person hand held assist Transfers: Sit to/from Stand, Bed to chair/wheelchair/BSC   Stand pivot transfers: Min assist         General transfer comment: 1 HHA to stand and pivot to recliner, maintained on HHFNC, RR up to 37, SPo2 >92%,    Ambulation/Gait                  Stairs            Wheelchair Mobility     Tilt Bed    Modified Rankin (Stroke Patients Only)       Balance Overall balance assessment: Needs assistance Sitting-balance support: No upper extremity supported, Feet supported Sitting balance-Leahy Scale: Fair     Standing balance support: Single extremity supported, During functional activity Standing balance-Leahy Scale: Fair                               Pertinent Vitals/Pain Pain Assessment Pain Assessment: Faces Faces Pain Scale: Hurts little more Pain Location: HA, legs Pain Descriptors / Indicators: Aching Pain Intervention(s): Monitored during session, Patient requesting pain meds-RN notified    Home Living Family/patient expects to be discharged to:: Private residence Living Arrangements: Spouse/significant other Available Help at Discharge: Family;Available PRN/intermittently Type of Home: House Home Access: Stairs to enter Entrance Stairs-Rails: None     Home Layout: One level Home Equipment: None      Prior Function Prior Level of Function : Independent/Modified Independent;Driving  Extremity/Trunk Assessment        Lower Extremity Assessment Lower Extremity Assessment: Generalized weakness    Cervical / Trunk Assessment Cervical / Trunk Assessment: Normal  Communication   Communication Communication: No apparent difficulties    Cognition Arousal: Alert Behavior During Therapy: WFL for tasks assessed/performed   PT -  Cognitive impairments: Orientation   Orientation impairments: Time                     Following commands: Intact       Cueing Cueing Techniques: Verbal cues     General Comments      Exercises     Assessment/Plan    PT Assessment Patient needs continued PT services  PT Problem List Decreased strength;Decreased activity tolerance;Decreased mobility;Decreased safety awareness       PT Treatment Interventions DME instruction;Therapeutic activities;Gait training;Therapeutic exercise;Patient/family education;Functional mobility training    PT Goals (Current goals can be found in the Care Plan section)  Acute Rehab PT Goals Patient Stated Goal: get moving PT Goal Formulation: With patient/family Time For Goal Achievement: 04/26/24 Potential to Achieve Goals: Good    Frequency Min 3X/week     Co-evaluation               AM-PAC PT 6 Clicks Mobility  Outcome Measure Help needed turning from your back to your side while in a flat bed without using bedrails?: None Help needed moving from lying on your back to sitting on the side of a flat bed without using bedrails?: None Help needed moving to and from a bed to a chair (including a wheelchair)?: A Little Help needed standing up from a chair using your arms (e.g., wheelchair or bedside chair)?: A Little Help needed to walk in hospital room?: Total Help needed climbing 3-5 steps with a railing? : Total 6 Click Score: 16    End of Session Equipment Utilized During Treatment: Oxygen  Activity Tolerance: Treatment limited secondary to medical complications (Comment) Patient left: in chair;with call bell/phone within reach;with family/visitor present Nurse Communication: Mobility status PT Visit Diagnosis: Unsteadiness on feet (R26.81);Difficulty in walking, not elsewhere classified (R26.2)    Time: 8861-8799 PT Time Calculation (min) (ACUTE ONLY): 22 min   Charges:   PT Evaluation $PT Eval Low Complexity:  1 Low   PT General Charges $$ ACUTE PT VISIT: 1 Visit         Darice Potters PT Acute Rehabilitation Services Office [phone]   Potters Darice Norris 04/12/2024, 2:20 PM

## 2024-04-12 NOTE — Progress Notes (Signed)
 Hypoglycemic Event  CBG: 58   Treatment: 4 oz juice/soda  Symptoms: Shaky and Nervous/irritable  Follow-up CBG: Time: 2338 CBG Result: 63   Possible Reasons for Event: Inadequate meal intake and Change in activity  Comments/MD notified: James Horns, NP notified. Per James Horns, NP  Repeat 4oz juice if CBG is less than 100  4oz of juice repeated at 2350 Rechecked CBG @0005 : 123 James Horns, NP notified of CBG recheck value    James [name]

## 2024-04-12 NOTE — TOC Initial Note (Signed)
 Transition of Care [facility]) - Initial/Assessment Note    Patient Details  Name: James [name] MRN: [id number] Date of Birth: 02/16/1956  [facility] CM/SW Contact:    Jon ONEIDA Anon, RN Phone Number: 04/12/2024, 3:49 PM  Clinical Narrative:                 Pt is from home with spouse. ICM acknowledges PT recommendations for HHPT. Pt currently requiring 30L O2 HHFNC, not currently medically stable for DC. Will set up [facility] once pt is closer to medical readiness. ICM continuing to follow.      Expected Discharge Plan: Home w Home Health Services Barriers to Discharge: Continued Medical Work up, Other (must enter comment) (Pt requiring incresed flow rate of O2)   Patient Goals and CMS Choice Patient states their goals for this hospitalization and ongoing recovery are:: To return home CMS Medicare.gov Compare Post Acute Care list provided to:: Patient Choice offered to / list presented to : Patient [place] ownership interest in [facility].provided to:: Patient    Expected Discharge Plan and Services In-house Referral: NA Discharge Planning Services: CM Consult Post Acute Care Choice: Home Health Living arrangements for the past 2 months: Single Family Home                 DME Arranged: N/A DME Agency: NA                  Prior Living Arrangements/Services Living arrangements for the past 2 months: Single Family Home Lives with:: Spouse Patient language and need for interpreter reviewed:: Yes Do you feel safe going back to the place where you live?: Yes      Need for Family Participation in Patient Care: Yes (Comment) Care giver support system in place?: Yes (comment) Current home services: Other (comment) (N/A) Criminal Activity/Legal Involvement Pertinent to Current Situation/Hospitalization: No - Comment as needed  Activities of Daily Living   ADL Screening (condition at time of admission) Independently performs ADLs?: Yes (appropriate for  developmental age) Is the patient deaf or have difficulty hearing?: Yes Does the patient have difficulty seeing, even when wearing glasses/contacts?: Yes Does the patient have difficulty concentrating, remembering, or making decisions?: Yes  Permission Sought/Granted Permission sought to share information with : Family Supports Permission granted to share information with : Yes, Verbal Permission Granted  Share Information with NAME: Frayre, [name] (Spouse)  [phone]           Emotional Assessment Appearance:: Appears stated age Attitude/Demeanor/Rapport: Unable to Assess Affect (typically observed): Unable to Assess   Alcohol  / Substance Use: Not Applicable Psych Involvement: No (comment)  Admission diagnosis:  Hypoxia [R09.02] Acute respiratory failure with hypoxia (HCC) [J96.01] Community acquired pneumonia of right lower lobe of lung [J18.9] Patient Active Problem List   Diagnosis Date Noted   Acute respiratory failure with hypoxia (HCC) 04/10/2024   Hypokalemia 04/10/2024   Pseudohyponatremia 04/10/2024   Thrombocytosis 04/10/2024   COPD with acute exacerbation (HCC) 04/10/2024   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 06/02/2023   Acute asthma exacerbation 04/30/2023   Chronic pain 04/29/2023   Hypothyroidism 04/29/2023   Autoimmune thyroiditis 12/10/2020   Benign prostatic hyperplasia with lower urinary tract symptoms 12/10/2020   Diastolic dysfunction 12/10/2020   Occlusion and stenosis of bilateral carotid arteries 12/10/2020   Bronchiectasis with (acute) exacerbation (HCC) 12/06/2019   CAP (community acquired pneumonia) 12/01/2019   Medication management 11/09/2019   Seizure disorder, secondary (HCC) 07/19/2019   Abnormal CT of  the chest 07/19/2019   Asthma, chronic, unspecified asthma severity, with acute exacerbation 06/29/2019   Spinal stenosis, 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   Hypoglycemia due to insulin  04/20/2019   DM type 1 (diabetes mellitus, type  1) (HCC) 04/20/2019   Anemia 01/05/2019   Polyneuropathy 07/18/2017   Long term (current) use of insulin  (HCC) 07/07/2017   HNP (herniated nucleus pulposus), cervical 06/24/2016   Syncope 05/20/2016   Tobacco abuse 05/20/2016   Cervical disc disorder with radiculopathy of cervical region 05/20/2016   Pneumonia of right lower lobe due to infectious organism 12/22/2015   Major depression, single episode 02/12/2015   Aspiration pneumonia (HCC) 02/12/2014   Sleep disorder 10/30/2012   Peripheral vascular disease 06/25/2011   Gastroparesis 02/10/2011   Esophageal reflux 12/21/2010   TOBACCO USE, QUIT 05/08/2010   CONTUSION, RIGHT CHEST WALL 04/07/2010   Chronic asthmatic bronchitis (HCC) 10/25/2008   Asthma, persistent controlled 09/06/2008   Cough 09/06/2008   SHOULDER PAIN, RIGHT 08/19/2008   BLADDER OUTLET OBSTRUCTION 04/15/2008   RASH AND OTHER NONSPECIFIC SKIN ERUPTION 02/21/2008   CARPAL TUNNEL SYNDROME, BILATERAL 07/31/2007   Allergic rhinitis 07/31/2007   RENAL INSUFFICIENCY 07/31/2007   OTHER TENOSYNOVITIS OF HAND AND WRIST 07/31/2007   DM W/RENAL MNFST, TYPE I, UNCONTROLLED 04/04/2007   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 [place] [address] Phone: [phone] Fax: [phone]     Social Drivers of Health (SDOH) Social History: SDOH Screenings   Food Insecurity: No Food Insecurity (04/10/2024)  Housing: Unknown (04/12/2024)  Transportation Needs: No Transportation Needs (04/10/2024)  Utilities: Not At Risk (04/10/2024)  Social Connections: Moderately Integrated  (04/10/2024)  Tobacco Use: Medium Risk (04/10/2024)   SDOH Interventions:     Readmission Risk Interventions    04/12/2024    3:45 PM  Readmission Risk Prevention Plan  Transportation Screening Complete  PCP or Specialist Appt within 5-7 Days Complete  Home Care Screening Complete  Medication Review (RN CM) Complete

## 2024-04-12 NOTE — Progress Notes (Signed)
 NAME:  James [name], MRN:  [id number], DOB:  October 21, 1955, LOS: 2 ADMISSION DATE:  04/10/2024, CONSULTATION DATE:  9/23 REFERRING MD:  Dr. Celinda, CHIEF COMPLAINT:  PNA; acute respiratory failure w/ hypoxia   History of Present Illness:  Patient is a 68 yo M w/ pertinent PMH asthma, multiple episodes of pna (seen by Dr. Jude outpt), anxiety, depression, T1DM, HLD, hypothyroidism, GERD, CKD, seizures presents to [facility] on 9/23 w/ hypoxic respiratory failure and pna.  Patient has hx of recurrent pna. Recently treated in 01/2024 w/ clinda and doxy w/ course of prednisone . States has cough in morning feels like he may be aspirating. Denies fever. Having worsening sob over the past month. On 9/23 checked pulse ox at home w/ sats in 70s. EMS called and placed on o2. Gave nebs and steroids. Transported to [facility] ED. Placed on salter [place]. Afebrile and wbc 8. Covid/flu/rsv and mrsa pcr negative. CXR w/ RLL pna. CTA chest negative for PE; RML and RLL consolidation. Given rocephin zenovia. PCCM consulted.  Pertinent  Medical History   Past Medical History:  Diagnosis Date   Anxiety    Asthma    ASTHMATIC BRONCHITIS, ACUTE 10/25/2008   Bladder neck obstruction    CARPAL TUNNEL SYNDROME, BILATERAL 07/31/2007   issues resolved, no surgery   Cervical disc disease    CORONARY ARTERY DISEASE 04/03/2007   Depression    DIABETES MELLITUS, TYPE I 04/03/2007   Diabetic retinopathy associated with diabetes mellitus due to underlying condition (HCC) 04/03/2007   DM W/EYE MANIFESTATIONS, TYPE I, UNCONTROLLED 04/04/2007   DM W/RENAL MNFST, TYPE I, UNCONTROLLED 04/04/2007   ED (erectile dysfunction)    History of kidney stones    HYPERLIPIDEMIA 04/04/2007   Hypothyroidism    Pneumonia    several times and again today (02/13/2104)   Renal insufficiency    Seizures (HCC)    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; none in the last couple years (02/12/2014)   Spinal stenosis      Significant Hospital Events: Including  procedures, antibiotic start and stop dates in addition to other pertinent events   9/23 admit w/ hypoxic resp failure 2/2 to Right sided pna  Interim History / Subjective:  On HHFNC; on arrival this am desating after cough. Bumped up to 70% fio2 on 35 l/m  Objective    Blood pressure (!) 131/56, pulse 85, temperature 98.4 F (36.9 C), temperature source Oral, resp. rate 19, SpO2 90%.    FiO2 (%):  [50 %-65 %] 50 %   Intake/Output Summary (Last 24 hours) at 04/12/2024 0720 Last data filed at 04/11/2024 2300 Gross per 24 hour  Intake 2109.54 ml  Output 1150 ml  Net 959.54 ml   There were no vitals filed for this visit.  Examination: General: NAD HEENT: MM pink/moist; poor dentition; hhfnc in place Neuro: Aox3; MAE CV: s1s2, RRR, no m/r/g PULM: dim crackles on right; dim clear on left; HHFNC 70% fio2 35 l/m GI: soft, bsx4 active  Extremities: warm/dry, no edema     Resolved problem list   Assessment and Plan   Acute respiratory failure w/ hypoxia RML/RLL PNA -patient states he has been having concerns of difficulty swallowing and aspirating ever since his spinal surgeries. Has not been able to be seen by SLP yet  Hx of asthma -rvp and strep negative -exp sputum and legionella pending Plan: -HHFNC wean fio2 for sats >92% -given worsening o2 requirements and bump in wbc in wbc to 12.3 from 9 will broaden  abx to zosyn  and cont azithro -follow sputum culture and urine legionella -cont guaifenesin  -cough suppressant medications -pulm toiletry: flutter/IS -pt/ot/oob -cont duoneb; triple therapy maintenance nebs -steroids per primary -slp eval for possible aspiration -PPI for gerd -will give trial of lasix     Critical care time: NA      JD Emilio DEVONNA [facility] 04/12/2024, 7:20 AM  Please see [email or web address] for pager details.  From 7A-7P if no response, please call [phone]. After hours, please call ELink [phone].

## 2024-04-12 NOTE — Plan of Care (Signed)
  Problem: Coping: Goal: Ability to adjust to condition or change in health will improve Outcome: Progressing   Problem: Metabolic: Goal: Ability to maintain appropriate glucose levels will improve Outcome: Progressing   Problem: Tissue Perfusion: Goal: Adequacy of tissue perfusion will improve Outcome: Progressing   Problem: Clinical Measurements: Goal: Respiratory complications will improve Outcome: Progressing   Problem: Respiratory: Goal: Ability to maintain a clear airway will improve Outcome: Progressing

## 2024-04-13 ENCOUNTER — Inpatient Hospital Stay (HOSPITAL_COMMUNITY)

## 2024-04-13 DIAGNOSIS — J9601 Acute respiratory failure with hypoxia: Secondary | ICD-10-CM | POA: Diagnosis not present

## 2024-04-13 LAB — GLUCOSE, CAPILLARY
Glucose-Capillary: 123 mg/dL — ABNORMAL HIGH (ref 70–99)
Glucose-Capillary: 137 mg/dL — ABNORMAL HIGH (ref 70–99)
Glucose-Capillary: 192 mg/dL — ABNORMAL HIGH (ref 70–99)
Glucose-Capillary: 217 mg/dL — ABNORMAL HIGH (ref 70–99)
Glucose-Capillary: 233 mg/dL — ABNORMAL HIGH (ref 70–99)
Glucose-Capillary: 332 mg/dL — ABNORMAL HIGH (ref 70–99)

## 2024-04-13 LAB — LEGIONELLA PNEUMOPHILA SEROGP 1 UR AG: L. pneumophila Serogp 1 Ur Ag: NEGATIVE

## 2024-04-13 LAB — CULTURE, RESPIRATORY W GRAM STAIN: Culture: NORMAL

## 2024-04-13 MED ORDER — AZITHROMYCIN 250 MG PO TABS
500.0000 mg | ORAL_TABLET | Freq: Every day | ORAL | Status: AC
  Administered 2024-04-13 – 2024-04-14 (×2): 500 mg via ORAL
  Filled 2024-04-13 (×2): qty 2

## 2024-04-13 MED ORDER — METHOCARBAMOL 500 MG PO TABS
500.0000 mg | ORAL_TABLET | Freq: Four times a day (QID) | ORAL | Status: AC | PRN
  Administered 2024-04-13 – 2024-04-16 (×2): 500 mg via ORAL
  Filled 2024-04-13 (×2): qty 1

## 2024-04-13 MED ORDER — INSULIN ASPART 100 UNIT/ML IJ SOLN
0.0000 [IU] | Freq: Three times a day (TID) | INTRAMUSCULAR | Status: DC
  Administered 2024-04-13: 1 [IU] via SUBCUTANEOUS
  Administered 2024-04-13: 7 [IU] via SUBCUTANEOUS
  Administered 2024-04-13: 2 [IU] via SUBCUTANEOUS
  Administered 2024-04-14: 7 [IU] via SUBCUTANEOUS
  Administered 2024-04-14: 9 [IU] via SUBCUTANEOUS
  Administered 2024-04-15 (×3): 5 [IU] via SUBCUTANEOUS
  Administered 2024-04-16: 2 [IU] via SUBCUTANEOUS
  Administered 2024-04-16 (×2): 5 [IU] via SUBCUTANEOUS
  Administered 2024-04-17: 2 [IU] via SUBCUTANEOUS
  Administered 2024-04-17: 5 [IU] via SUBCUTANEOUS
  Administered 2024-04-17: 2 [IU] via SUBCUTANEOUS
  Administered 2024-04-18: 3 [IU] via SUBCUTANEOUS

## 2024-04-13 MED ORDER — ENSURE PLUS HIGH PROTEIN PO LIQD
237.0000 mL | Freq: Two times a day (BID) | ORAL | Status: DC
  Administered 2024-04-13 – 2024-04-18 (×6): 237 mL via ORAL

## 2024-04-13 MED ORDER — INSULIN ASPART 100 UNIT/ML IJ SOLN: 0.0000 [IU] | Freq: Three times a day (TID) | INTRAMUSCULAR | Status: DC

## 2024-04-13 MED ORDER — INSULIN ASPART 100 UNIT/ML IJ SOLN
0.0000 [IU] | Freq: Every day | INTRAMUSCULAR | Status: DC
  Administered 2024-04-13 – 2024-04-14 (×2): 2 [IU] via SUBCUTANEOUS
  Administered 2024-04-15: 3 [IU] via SUBCUTANEOUS
  Administered 2024-04-17: 2 [IU] via SUBCUTANEOUS

## 2024-04-13 MED ORDER — HYDROCODONE-ACETAMINOPHEN 5-325 MG PO TABS
1.0000 | ORAL_TABLET | ORAL | Status: DC | PRN
  Administered 2024-04-13 – 2024-04-18 (×12): 1 via ORAL
  Filled 2024-04-13 (×12): qty 1

## 2024-04-13 MED ORDER — FUROSEMIDE 10 MG/ML IJ SOLN
40.0000 mg | Freq: Once | INTRAMUSCULAR | Status: AC
  Administered 2024-04-13: 40 mg via INTRAVENOUS
  Filled 2024-04-13: qty 4

## 2024-04-13 MED ORDER — INSULIN GLARGINE 100 UNIT/ML ~~LOC~~ SOLN
30.0000 [IU] | Freq: Every day | SUBCUTANEOUS | Status: DC
  Administered 2024-04-13: 30 [IU] via SUBCUTANEOUS
  Filled 2024-04-13 (×3): qty 0.3

## 2024-04-13 MED ORDER — INSULIN GLARGINE 100 UNIT/ML ~~LOC~~ SOLN
35.0000 [IU] | Freq: Every day | SUBCUTANEOUS | Status: DC
  Filled 2024-04-13: qty 0.35

## 2024-04-13 MED ORDER — INSULIN ASPART 100 UNIT/ML IJ SOLN
6.0000 [IU] | Freq: Three times a day (TID) | INTRAMUSCULAR | Status: DC
Start: 2024-04-13 — End: 2024-04-13

## 2024-04-13 NOTE — Plan of Care (Signed)
  Problem: Nutritional: Goal: Maintenance of adequate nutrition will improve Outcome: Progressing   Problem: Tissue Perfusion: Goal: Adequacy of tissue perfusion will improve Outcome: Progressing   Problem: Nutrition: Goal: Adequate nutrition will be maintained Outcome: Progressing   Problem: Skin Integrity: Goal: Risk for impaired skin integrity will decrease Outcome: Progressing   Problem: Clinical Measurements: Goal: Respiratory complications will improve Outcome: Not Progressing
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
## 2024-04-13 NOTE — Inpatient Diabetes Management (Signed)
 Inpatient Diabetes Program Recommendations  AACE/ADA: New Consensus Statement on Inpatient Glycemic Control (2015)  Target Ranges:  Prepandial:   less than 140 mg/dL      Peak postprandial:   less than 180 mg/dL (1-2 hours)      Critically ill patients:  140 - 180 mg/dL   Lab Results  Component Value Date   GLUCAP 192 (H) 04/13/2024   HGBA1C 6.7 (H) 02/23/2024    Review of Glycemic Control  Latest Reference Range & Units 04/12/24 12:11 04/12/24 16:31 04/12/24 21:13 04/12/24 23:23 04/12/24 23:40 04/13/24 00:11 04/13/24 05:51 04/13/24 07:52  Glucose-Capillary 70 - 99 mg/dL 865 (H) 869 (H) 865 (H) 58 (L) 63 (L) 123 (H) 217 (H) 192 (H)  (H): Data is abnormally high  Diabetes history: Type 1 DM (requires basal and meal coverage) Outpatient Diabetes medications: Lantus  27 units every [place], ICR 1:20 Current orders for Inpatient glycemic control: Lantus  30 units every [place], Novolog  0-9 units TID & HS Prednisone  40 mg every [place]  Inpatient Diabetes Program Recommendations:    Noted hypoglycemia yesterday following correction and basal increase. Noted insulin  adjustments.  Consider also adding Novolog  3 units TID (assuming patient is consuming >50% of meals)  Thanks, Tinnie Minus, MSN, RNC-OB Diabetes Coordinator [phone] (8a-5p)

## 2024-04-13 NOTE — Progress Notes (Addendum)
 NAME:  James [name], MRN:  [id number], DOB:  July 06, 1956, LOS: 3 ADMISSION DATE:  04/10/2024, CONSULTATION DATE:  9/23 REFERRING MD:  Dr. Celinda, CHIEF COMPLAINT:  PNA; acute respiratory failure w/ hypoxia   History of Present Illness:  Patient is a 68 yo M w/ pertinent PMH asthma, multiple episodes of pna (seen by Dr. Jude outpt), anxiety, depression, T1DM, HLD, hypothyroidism, GERD, CKD, seizures presents to [facility] on 9/23 w/ hypoxic respiratory failure and pna.  Patient has hx of recurrent pna. Recently treated in 01/2024 w/ clinda and doxy w/ course of prednisone . States has cough in morning feels like he may be aspirating. Denies fever. Having worsening sob over the past month. On 9/23 checked pulse ox at home w/ sats in 70s. EMS called and placed on o2. Gave nebs and steroids. Transported to [facility] ED. Placed on salter [place]. Afebrile and wbc 8. Covid/flu/rsv and mrsa pcr negative. CXR w/ RLL pna. CTA chest negative for PE; RML and RLL consolidation. Given rocephin zenovia. PCCM consulted.  Pertinent  Medical History   Past Medical History:  Diagnosis Date   Anxiety    Asthma    ASTHMATIC BRONCHITIS, ACUTE 10/25/2008   Bladder neck obstruction    CARPAL TUNNEL SYNDROME, BILATERAL 07/31/2007   issues resolved, no surgery   Cervical disc disease    CORONARY ARTERY DISEASE 04/03/2007   Depression    DIABETES MELLITUS, TYPE I 04/03/2007   Diabetic retinopathy associated with diabetes mellitus due to underlying condition (HCC) 04/03/2007   DM W/EYE MANIFESTATIONS, TYPE I, UNCONTROLLED 04/04/2007   DM W/RENAL MNFST, TYPE I, UNCONTROLLED 04/04/2007   ED (erectile dysfunction)    History of kidney stones    HYPERLIPIDEMIA 04/04/2007   Hypothyroidism    Pneumonia    several times and again today (02/13/2104)   Renal insufficiency    Seizures (HCC)    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 Bumped up to 70% fio2 on 35 l/m 9/26 Remains on HHFNC at 40L, ongoing issues with coughing spells   Interim History / Subjective:  Seen sitting up in bed on HHFN with ongoing complaints of dyspnea especially with coughing spells   Objective    Blood pressure (!) 160/59, pulse 90, temperature 98.7 F (37.1 C), temperature source Oral, resp. rate (!) 30, SpO2 93%.    FiO2 (%):  [50 %-75 %] 60 %   Intake/Output Summary (Last 24 hours) at 04/13/2024 0833 Last data filed at 04/13/2024 0616 Gross per 24 hour  Intake 1092.21 ml  Output 3700 ml  Net -2607.79 ml   There were no vitals filed for this visit.  Examination: General: Acute on chronically ill appearing middle aged male sitting up in bed, in NAD HEENT: [place]/AT HHFNC in place , MM pink/moist, PERRL,  Neuro: Alert and oriented x3, non-focal  CV: s1s2 regular rate and rhythm, no murmur, rubs, or gallops,  PULM:  Diminished bilaterally with expiratory rhonchi, remains on 40L HHFNC, no increased work of breathing GI: soft, bowel sounds active in all 4 quadrants, non-tender, non-distended, tolerating oral diet  Extremities: warm/dry, no edema  Skin: no rashes or lesions  Resolved problem list   Assessment and Plan  Acute respiratory failure w/ hypoxia RML/RLL PNA -patient states he has been having concerns of difficulty swallowing and aspirating ever since his spinal surgeries. Has not been able to be seen by SLP yet  Hx of asthma -rvp and strep negative -exp sputum and legionella pending P: Continue HHFNC  Antibiotics escalated to Zosyn  9/25, continue  Follow cultures  Triple ned therapy  Aggressive pulmonary hygiene  Mobilize as able  Remains on 40 prednisone  pre primary  SLP following, currently on reg diet with thin liquids   Continue to diureses as able As needed cough suppressants  Consider starting antihypertensives   Critical care time: NA  James Bienaime D. Harris, NP-C [place] [facility] Personal contact information can be found on Amion  If no contact or response made please call [phone] 04/13/2024, 8:39 AM

## 2024-04-13 NOTE — Progress Notes (Signed)
 PROGRESS NOTE    James [name]  FMW:[id number] DOB: January 12, 1956 DOA: 04/10/2024 PCP: Nichole Senior, MD    Brief Narrative:  68 anxiety depression, asthma COPD, coronary artery disease, type 1 diabetes on insulin , diabetic retinopathy nephrolithiasis, hypothyroidism hyperlipidemia, multiple recurrent pneumonia presented to ER with progressive worsening shortness of breath associated with productive cough, pleuritic chest pain, wheezing fatigue persistent despite outpatient treatment with antibiotics and steroids.  In the emergency room, WBC count 8.3.  COVID, influenza and RSV negative.  Potassium 3.3.  Chest x-ray and CT angiogram with middle and the right lower lobe pulmonary consolidation consistent with pneumonia.  Initially on 6 L of oxygen .  Admitted with pulmonary consultation.  Subjective: Patient seen and examined.  He tells me he had a rough night.  Could not get cough medications overnight.  Also has pain in the anterior chest wall with coughing and wants to see if he can use more pain medications.  Denies any nausea or vomiting.  Afebrile overnight.  Remains on high flow oxygen .  A chest x-ray was ordered and reviewed, shows more prominent consolidation in the right middle lobe.  Clearing of the right lower lobe.  Assessment & Plan:   Community-acquired pneumonia, right middle and lower lobe pneumonia failed outpatient therapy Acute hypoxemic respiratory failure secondary to COPD exacerbation due to pneumonia.  bronchodilator therapy, oral steroids, inhalational steroids, scheduled and as needed bronchodilators, deep breathing exercises, incentive spirometry, chest physiotherapy. Culture is negative so far.  Sputum cultures with mixed organism.  Likely respiratory flora. Remains on IV Zosyn  and azithromycin .  Will continue. Supplemental oxygen  to keep saturations more than 90%. Appreciate pulmonary input. Continue mobilizing.  On scheduled Mucinex , cough medications.  Hypokalemia:  Persistent.  Keep on a scheduled replacement and monitor.  Recheck tomorrow.  Type 1 diabetes on insulin , uncontrolled with hyperglycemia secondary to steroid use: Hypoglycemia with poor oral intake. Lantus  40-decreased to 30 today. Sliding scale insulin . Discontinue prandial insulin  today until he is oral intake is stabilized. Encourage nutrition.    BPH with urinary retention due to acute illness: Continue Foley catheter.  Will give voiding trial only when he is able to mobilize around independently.  Continue finasteride  and Flomax .  May go home with catheter with outpatient voiding trial if not successful.  Chronic medical issues including Hyperlipidemia, on a statin to continue Depression and insomnia, on trazodone .  Inadequate symptom relief.  Changed to Ativan  per the hospital. History of coronary artery disease, on aspirin  and simvastatin .  Stable. GERD, on Protonix . Hypothyroidism, on Synthroid .     DVT prophylaxis: enoxaparin  (LOVENOX ) injection 40 mg Start: 04/10/24 2200   Code Status: Full code Family Communication: None today. Disposition Plan: Status is: Inpatient Remains inpatient appropriate because: Severe significant symptoms, on high flow oxygen .     Consultants:  PCCM  Procedures:  None  Antimicrobials:  Rocephin  9/23-9/25 Azithromycin  9/23- IV Zosyn  9/25----     Objective: Vitals:   04/13/24 0836 04/13/24 0839 04/13/24 0900 04/13/24 1000  BP:   (!) 162/70 (!) 140/71  Pulse:   86 84  Resp:   (!) 23 (!) 29  Temp: 98.7 F (37.1 C)     TempSrc: Oral     SpO2: 94% 95% 95% 98%    Intake/Output Summary (Last 24 hours) at 04/13/2024 1054 Last data filed at 04/13/2024 0934 Gross per 24 hour  Intake 852.21 ml  Output 3640 ml  Net -2787.79 ml   There were no vitals filed for this visit.  Examination:  General exam: Slightly anxious.  Apprehensive taking deep breaths. Respiratory system: Poor inspiratory effort.  Poor bilateral air  entry. SpO2: 98 % O2 Flow Rate (L/min): 40 L/min FiO2 (%): 60 %  Cardiovascular system: S1 & S2 heard, RRR. No JVD, murmurs, rubs, gallops or clicks. No pedal edema. Gastrointestinal system: Abdomen is nondistended, soft and nontender. No organomegaly or masses felt. Normal bowel sounds heard. Foley catheter with clear urine. Central nervous system: Alert and oriented. No focal neurological deficits. Extremities: Symmetric 5 x 5 power. Skin: No rashes, lesions or ulcers    Data Reviewed: I have personally reviewed following labs and imaging studies  CBC: Recent Labs  Lab 04/10/24 1040 04/11/24 0320 04/12/24 0320  WBC 8.3 9.1 12.3*  NEUTROABS 5.8  --  9.9*  HGB 13.0 12.1* 11.9*  HCT 39.3 35.6* 34.9*  MCV 90.1 90.8 91.1  PLT 431* 441* 451*   Basic Metabolic Panel: Recent Labs  Lab 04/10/24 1040 04/11/24 0320 04/12/24 0320  NA 134* 134* 140  K 3.3* 5.8* 3.3*  CL 98 101 104  CO2 20* 21* 22  GLUCOSE 253* 232* 102*  BUN 14 14 17   CREATININE 0.80 0.74 0.77  CALCIUM 8.9 9.0 8.8*  MG 2.0  --  2.2  PHOS 2.0*  --  4.2   GFR: Estimated Creatinine Clearance: 87.1 mL/min (by C-G formula based on SCr of 0.77 mg/dL). Liver Function Tests: Recent Labs  Lab 04/11/24 0320 04/12/24 0320  AST <10* 21  ALT 7 9  ALKPHOS 99 92  BILITOT 0.4 0.3  PROT 6.5 6.2*  ALBUMIN 3.2* 3.2*   No results for input(s): LIPASE, AMYLASE in the last 168 hours. No results for input(s): AMMONIA in the last 168 hours. Coagulation Profile: No results for input(s): INR, PROTIME in the last 168 hours. Cardiac Enzymes: No results for input(s): CKTOTAL, CKMB, CKMBINDEX, TROPONINI in the last 168 hours. BNP (last 3 results) No results for input(s): PROBNP in the last 8760 hours. HbA1C: No results for input(s): HGBA1C in the last 72 hours. CBG: Recent Labs  Lab 04/12/24 2323 04/12/24 2340 04/13/24 0011 04/13/24 0551 04/13/24 0752  GLUCAP 58* 63* 123* 217* 192*   Lipid  Profile: No results for input(s): CHOL, HDL, LDLCALC, TRIG, CHOLHDL, LDLDIRECT in the last 72 hours. Thyroid  Function Tests: No results for input(s): TSH, T4TOTAL, FREET4, T3FREE, THYROIDAB in the last 72 hours. Anemia Panel: No results for input(s): VITAMINB12, FOLATE, FERRITIN, TIBC, IRON, RETICCTPCT in the last 72 hours. Sepsis Labs: No results for input(s): PROCALCITON, LATICACIDVEN in the last 168 hours.  Recent Results (from the past 240 hours)  Resp panel by RT-PCR (RSV, Flu A&B, Covid) Anterior Nasal Swab     Status: None   Collection Time: 04/10/24 10:31 AM   Specimen: Anterior Nasal Swab  Result Value Ref Range Status   SARS Coronavirus 2 by RT PCR NEGATIVE NEGATIVE Final    Comment: (NOTE) SARS-CoV-2 target nucleic acids are NOT DETECTED.  The SARS-CoV-2 RNA is generally detectable in upper respiratory specimens during the acute phase of infection. The lowest concentration of SARS-CoV-2 viral copies this assay can detect is 138 copies/mL. A negative result does not preclude SARS-Cov-2 infection and should not be used as the sole basis for treatment or other patient management decisions. A negative result may occur with  improper specimen collection/handling, submission of specimen other than nasopharyngeal swab, presence of viral mutation(s) within the areas targeted by this assay, and inadequate number of viral copies(<138 copies/mL). A negative result  must be combined with clinical observations, patient history, and epidemiological information. The expected result is Negative.  Fact Sheet for Patients:  BloggerCourse.com  Fact Sheet for Healthcare Providers:  SeriousBroker.it  This test is no t yet approved or cleared by the [place]  FDA and  has been authorized for detection and/or diagnosis of SARS-CoV-2 by FDA under an Emergency Use Authorization (EUA). This EUA will  remain  in effect (meaning this test can be used) for the duration of the COVID-19 declaration under Section 564(b)(1) of the Act, 21 U.S.C.section 360bbb-3(b)(1), unless the authorization is terminated  or revoked sooner.       Influenza A by PCR NEGATIVE NEGATIVE Final   Influenza B by PCR NEGATIVE NEGATIVE Final    Comment: (NOTE) The Xpert Xpress SARS-CoV-2/FLU/RSV plus assay is intended as an aid in the diagnosis of influenza from Nasopharyngeal swab specimens and should not be used as a sole basis for treatment. Nasal washings and aspirates are unacceptable for Xpert Xpress SARS-CoV-2/FLU/RSV testing.  Fact Sheet for Patients: BloggerCourse.com  Fact Sheet for Healthcare Providers: SeriousBroker.it  This test is not yet approved or cleared by the [place]  FDA and has been authorized for detection and/or diagnosis of SARS-CoV-2 by FDA under an Emergency Use Authorization (EUA). This EUA will remain in effect (meaning this test can be used) for the duration of the COVID-19 declaration under Section 564(b)(1) of the Act, 21 U.S.C. section 360bbb-3(b)(1), unless the authorization is terminated or revoked.     Resp Syncytial Virus by PCR NEGATIVE NEGATIVE Final    Comment: (NOTE) Fact Sheet for Patients: BloggerCourse.com  Fact Sheet for Healthcare Providers: SeriousBroker.it  This test is not yet approved or cleared by the [place]  FDA and has been authorized for detection and/or diagnosis of SARS-CoV-2 by FDA under an Emergency Use Authorization (EUA). This EUA will remain in effect (meaning this test can be used) for the duration of the COVID-19 declaration under Section 564(b)(1) of the Act, 21 U.S.C. section 360bbb-3(b)(1), unless the authorization is terminated or revoked.  Performed at [facility], [address], [place], [place]  [address]   MRSA Next Gen by PCR, Nasal     Status: None   Collection Time: 04/10/24  3:11 PM   Specimen: Nasal Mucosa; Nasal Swab  Result Value Ref Range Status   MRSA by PCR Next Gen NOT DETECTED NOT DETECTED Final    Comment: (NOTE) The GeneXpert MRSA Assay (FDA approved for NASAL specimens only), is one component of a comprehensive MRSA colonization surveillance program. It is not intended to diagnose MRSA infection nor to guide or monitor treatment for MRSA infections. Test performance is not FDA approved in patients less than 48 years old. Performed at [facility], [address], [place], [place] [address]   Respiratory (~20 pathogens) panel by PCR     Status: None   Collection Time: 04/10/24  6:32 PM   Specimen: Nasopharyngeal Swab; Respiratory  Result Value Ref Range Status   Adenovirus NOT DETECTED NOT DETECTED Final   Coronavirus 229E NOT DETECTED NOT DETECTED Final    Comment: (NOTE) The Coronavirus on the Respiratory Panel, DOES NOT test for the novel  Coronavirus (2019 nCoV)    Coronavirus HKU1 NOT DETECTED NOT DETECTED Final   Coronavirus NL63 NOT DETECTED NOT DETECTED Final   Coronavirus OC43 NOT DETECTED NOT DETECTED Final   Metapneumovirus NOT DETECTED NOT DETECTED Final   Rhinovirus / Enterovirus NOT DETECTED NOT DETECTED Final  Influenza A NOT DETECTED NOT DETECTED Final   Influenza B NOT DETECTED NOT DETECTED Final   Parainfluenza Virus 1 NOT DETECTED NOT DETECTED Final   Parainfluenza Virus 2 NOT DETECTED NOT DETECTED Final   Parainfluenza Virus 3 NOT DETECTED NOT DETECTED Final   Parainfluenza Virus 4 NOT DETECTED NOT DETECTED Final   Respiratory Syncytial Virus NOT DETECTED NOT DETECTED Final   Bordetella pertussis NOT DETECTED NOT DETECTED Final   Bordetella Parapertussis NOT DETECTED NOT DETECTED Final   Chlamydophila pneumoniae NOT DETECTED NOT DETECTED Final   Mycoplasma pneumoniae NOT DETECTED NOT DETECTED Final    Comment:  Performed at [facility], [address] [place], [place] [address]  Expectorated Sputum Assessment w Gram Stain, Rflx to Resp Cult     Status: None   Collection Time: 04/11/24 12:59 AM   Specimen: Expectorated Sputum  Result Value Ref Range Status   Specimen Description EXPECTORATED SPUTUM  Final   Special Requests EXPECTORATED SPUTUM  Final   Sputum evaluation   Final    THIS SPECIMEN IS ACCEPTABLE FOR SPUTUM CULTURE Performed at [facility], [address] [place], [place] [address]    Report Status 04/11/2024 FINAL  Final  Culture, Respiratory w Gram Stain     Status: None   Collection Time: 04/11/24 12:59 AM  Result Value Ref Range Status   Specimen Description   Final    EXPECTORATED SPUTUM Performed at [facility], [address], [place], [place] [address]    Special Requests   Final    EXPECTORATED SPUTUM Reflexed from [id number] Performed at [facility], [address] [place], [place] [address]    Gram Stain   Final    FEW WBC PRESENT, PREDOMINANTLY PMN FEW GRAM POSITIVE COCCI FEW GRAM NEGATIVE RODS    Culture   Final    FEW Normal respiratory flora-no Staph aureus or Pseudomonas seen Performed at [facility], [address], [place], [place] [address]    Report Status 04/13/2024 FINAL  Final         Radiology Studies: No results found.       Scheduled Meds:  arformoterol   15 mcg Nebulization Q12H   aspirin  EC  81 mg Oral QHS   azithromycin   500 mg Oral Daily   budesonide  (PULMICORT ) nebulizer solution  0.5 mg Nebulization BID   Chlorhexidine  Gluconate Cloth  6 each Topical Daily   enoxaparin  (LOVENOX ) injection  40 mg Subcutaneous Q24H   feeding supplement  237 mL Oral BID BM   finasteride   5 mg Oral QHS   fluticasone   2 spray Each Nare BID   furosemide   40 mg Intravenous Once   guaiFENesin   1,200 mg Oral BID   Influenza vac split trivalent PF  0.5 mL Intramuscular  Tomorrow-1000   insulin  aspart  0-5 Units Subcutaneous QHS   insulin  aspart  0-9 Units Subcutaneous TID WC   insulin  glargine  30 Units Subcutaneous Daily   levothyroxine   100 mcg Oral QHS   loratadine   10 mg Oral QHS   metoCLOPramide   5 mg Oral BID AC   pantoprazole   40 mg Oral QHS   potassium chloride   40 mEq Oral Daily   predniSONE   40 mg Oral Q breakfast   revefenacin   175 mcg Nebulization Daily   simvastatin   40 mg Oral Daily   tamsulosin   0.4 mg Oral QHS   Continuous Infusions:  piperacillin -tazobactam (ZOSYN )  IV Stopped (04/13/24  0534)     LOS: 3 days    Time spent: 52 minutes    Renato Applebaum, MD [facility]

## 2024-04-13 NOTE — Progress Notes (Signed)
 Occupational Therapy Evaluation Patient Details Name: James [name] MRN: [id number] DOB: November 05, 1955 Today's Date: 04/13/2024   History of Present Illness   James [name] is a 68 yr old male who presented with SOB and cough; admitted 9/23 with hypoxic respiratory failure and PNA. [name]:jwkpzub depression, asthma COPD, coronary artery disease, type 1 diabetes on insulin , diabetic retinopathy, nephrolithiasis, hypothyroidism, hyperlipidemia, recurrent pneumonia     Clinical Impressions The pt is currently presenting below his baseline level of functioning for self-care management. He is limited by the below listed deficits (see OT problem list). During the session today, he required SBA to light CGA for most assessed tasks, including supine to sit, doffing then donning socks seated EOB, sit to stand, and to step-pivot to the bedside chair. He is currently requiring increased supplemental O2. He is further noted to be with compromised endurance. OT anticipates he will do well functionally, should his supplemental O2 needs decrease. OT will follow him for further services in the acute care setting to maximize his independence with self care tasks and to decrease the risk for restricted participation in meaningful activities. No post-acute care therapy needs anticipated.      If plan is discharge home, recommend the following:   Help with stairs or ramp for entrance;Assistance with cooking/housework     Functional Status Assessment   Patient has had a recent decline in their functional status and demonstrates the ability to make significant improvements in function in a reasonable and predictable amount of time.     Equipment Recommendations   Tub/shower seat     Recommendations for Other Services         Precautions/Restrictions   Restrictions Weight Bearing Restrictions Per Provider Order: No Other Position/Activity Restrictions: On high flow nasal cannula. Monitor O2 saturation      Mobility Bed Mobility Overal bed mobility: Needs Assistance, Modified Independent Bed Mobility: Supine to Sit                Transfers Overall transfer level: Needs assistance Equipment used: None Transfers: Sit to/from Stand, Bed to chair/wheelchair/BSC Sit to Stand: Supervision     Step pivot transfers: Contact guard assist            Balance     Sitting balance-Leahy Scale: Good       Standing balance-Leahy Scale: Fair              ADL either performed or assessed with clinical judgement   ADL Overall ADL's : Needs assistance/impaired Eating/Feeding: Independent;Sitting   Grooming: Set up;Sitting           Upper Body Dressing : Set up;Sitting   Lower Body Dressing: Supervision/safety;Set up;Sitting/lateral leans Lower Body Dressing Details (indicate cue type and reason): He implemented the figure 4 technique to doff then donn his socks seated EOB. Toilet Transfer: Supervision/safety;Ambulation;Contact guard assist   Toileting- Clothing Manipulation and Hygiene: Contact guard assist;Sit to/from stand Toileting - Clothing Manipulation Details (indicate cue type and reason): at bathroom level, based on clinical judgement              Pertinent Vitals/Pain Pain Assessment Pain Assessment: 0-10 Pain Score: 5  Pain Location: chest and chronic low back pain Pain Intervention(s): Monitored during session, Repositioned     Extremity/Trunk Assessment Upper Extremity Assessment Upper Extremity Assessment: Overall WFL for tasks assessed;Right hand dominant   Lower Extremity Assessment Lower Extremity Assessment: Overall WFL for tasks assessed       Communication Communication Communication: No apparent  difficulties   Cognition Arousal: Alert Behavior During Therapy: WFL for tasks assessed/performed Cognition: No apparent impairments             OT - Cognition Comments: Oriented x4          Following commands: Intact        Cueing  General Comments   Cueing Techniques: Verbal cues              Home Living Family/patient expects to be discharged to:: Private residence Living Arrangements: Spouse/significant other Available Help at Discharge: Family Type of Home: House Home Access: Stairs to enter Secretary/administrator of Steps: 2 Entrance Stairs-Rails: None Home Layout: One level     Bathroom Shower/Tub: Tub/shower unit         Home Equipment: None          Prior Functioning/Environment Prior Level of Function : Independent/Modified Independent;Driving             Mobility Comments: Independent with ambulation. ADLs Comments: Independent with ADLs, driving, and sharing the household cleaning with his spouse.    OT Problem List: Pain;Cardiopulmonary status limiting activity;Decreased activity tolerance;Decreased knowledge of use of DME or AE   OT Treatment/Interventions: Self-care/ADL training;Therapeutic exercise;Therapeutic activities;Energy conservation;Patient/family education;DME and/or AE instruction;Balance training      OT Goals(Current goals can be found in the care plan section)   Acute Rehab OT Goals OT Goal Formulation: With patient Time For Goal Achievement: 04/27/24 Potential to Achieve Goals: Good ADL Goals Pt Will Perform Grooming: with modified independence;standing Pt Will Perform Lower Body Dressing: with modified independence;sit to/from stand;sitting/lateral leans Pt Will Transfer to Toilet: with modified independence;ambulating Pt Will Perform Toileting - Clothing Manipulation and hygiene: with modified independence;sit to/from stand   OT Frequency:  Min 2X/week       AM-PAC OT 6 Clicks Daily Activity     Outcome Measure Help from another person eating meals?: None Help from another person taking care of personal grooming?: A Little Help from another person toileting, which includes using toliet, bedpan, or urinal?: A Little Help from another  person bathing (including washing, rinsing, drying)?: A Little Help from another person to put on and taking off regular upper body clothing?: None Help from another person to put on and taking off regular lower body clothing?: A Little 6 Click Score: 20   End of Session Equipment Utilized During Treatment: Oxygen  Nurse Communication: Other (comment) (nurse cleared the pt for therapy participation)  Activity Tolerance: Patient tolerated treatment well Patient left: in chair;with call bell/phone within reach;Other (comment) (respiratory therapist present in the room)  OT Visit Diagnosis: Muscle weakness (generalized) (M62.81);Pain Pain - part of body:  (chest and chronic back pain)                Time: 8444-8385 OT Time Calculation (min): 19 min Charges:  OT General Charges $OT Visit: 1 Visit OT Evaluation $OT Eval Moderate Complexity: 1 Mod     Christyan Reger J Harris, OTR/L 04/13/2024, 5:20 PM

## 2024-04-14 DIAGNOSIS — J9601 Acute respiratory failure with hypoxia: Secondary | ICD-10-CM | POA: Diagnosis not present

## 2024-04-14 LAB — COMPREHENSIVE METABOLIC PANEL WITH GFR
ALT: 9 U/L (ref 0–44)
AST: <10 U/L — ABNORMAL LOW (ref 15–41)
Albumin: 3.3 g/dL — ABNORMAL LOW (ref 3.5–5.0)
Alkaline Phosphatase: 97 U/L (ref 38–126)
Anion gap: 10 (ref 5–15)
BUN: 19 mg/dL (ref 8–23)
CO2: 27 mmol/L (ref 22–32)
Calcium: 9.1 mg/dL (ref 8.9–10.3)
Chloride: 99 mmol/L (ref 98–111)
Creatinine, Ser: 0.86 mg/dL (ref 0.61–1.24)
GFR, Estimated: >60 mL/min (ref >60)
Glucose, Bld: 108 mg/dL — ABNORMAL HIGH (ref 70–99)
Potassium: 4.3 mmol/L (ref 3.5–5.1)
Sodium: 136 mmol/L (ref 135–145)
Total Bilirubin: 0.4 mg/dL (ref 0.0–1.2)
Total Protein: 6.5 g/dL (ref 6.5–8.1)

## 2024-04-14 LAB — CBC WITH DIFFERENTIAL/PLATELET
Abs Immature Granulocytes: 0.05 K/uL (ref 0.00–0.07)
Basophils Absolute: 0 K/uL (ref 0.0–0.1)
Basophils Relative: 0 %
Eosinophils Absolute: 0.1 K/uL (ref 0.0–0.5)
Eosinophils Relative: 1 %
HCT: 38.3 % — ABNORMAL LOW (ref 39.0–52.0)
Hemoglobin: 12.9 g/dL — ABNORMAL LOW (ref 13.0–17.0)
Immature Granulocytes: 0 %
Lymphocytes Relative: 11 %
Lymphs Abs: 1.5 K/uL (ref 0.7–4.0)
MCH: 30.8 pg (ref 26.0–34.0)
MCHC: 33.7 g/dL (ref 30.0–36.0)
MCV: 91.4 fL (ref 80.0–100.0)
Monocytes Absolute: 1 K/uL (ref 0.1–1.0)
Monocytes Relative: 8 %
Neutro Abs: 10.7 K/uL — ABNORMAL HIGH (ref 1.7–7.7)
Neutrophils Relative %: 80 %
Platelets: 414 K/uL — ABNORMAL HIGH (ref 150–400)
RBC: 4.19 MIL/uL — ABNORMAL LOW (ref 4.22–5.81)
RDW: 11.9 % (ref 11.5–15.5)
WBC: 13.3 K/uL — ABNORMAL HIGH (ref 4.0–10.5)
nRBC: 0 % (ref 0.0–0.2)

## 2024-04-14 LAB — GLUCOSE, CAPILLARY
Glucose-Capillary: 55 mg/dL — ABNORMAL LOW (ref 70–99)
Glucose-Capillary: 56 mg/dL — ABNORMAL LOW (ref 70–99)
Glucose-Capillary: 140 mg/dL — ABNORMAL HIGH (ref 70–99)
Glucose-Capillary: 91 mg/dL (ref 70–99)
Glucose-Capillary: 320 mg/dL — ABNORMAL HIGH (ref 70–99)
Glucose-Capillary: 359 mg/dL — ABNORMAL HIGH (ref 70–99)
Glucose-Capillary: 404 mg/dL — ABNORMAL HIGH (ref 70–99)
Glucose-Capillary: 246 mg/dL — ABNORMAL HIGH (ref 70–99)

## 2024-04-14 LAB — MAGNESIUM: Magnesium: 2.3 mg/dL (ref 1.7–2.4)

## 2024-04-14 MED ORDER — INSULIN ASPART 100 UNIT/ML IJ SOLN
10.0000 [IU] | Freq: Once | INTRAMUSCULAR | Status: AC
  Administered 2024-04-14: 10 [IU] via SUBCUTANEOUS

## 2024-04-14 MED ORDER — INSULIN GLARGINE 100 UNIT/ML ~~LOC~~ SOLN
20.0000 [IU] | Freq: Every day | SUBCUTANEOUS | Status: DC
  Administered 2024-04-14 – 2024-04-18 (×5): 20 [IU] via SUBCUTANEOUS
  Filled 2024-04-14 (×5): qty 0.2

## 2024-04-14 MED ORDER — FUROSEMIDE 10 MG/ML IJ SOLN
40.0000 mg | Freq: Once | INTRAMUSCULAR | Status: AC
  Administered 2024-04-14: 40 mg via INTRAVENOUS
  Filled 2024-04-14: qty 4

## 2024-04-14 MED ORDER — ALBUMIN HUMAN 25 % IV SOLN
25.0000 g | Freq: Once | INTRAVENOUS | Status: AC
  Administered 2024-04-14: 25 g via INTRAVENOUS
  Filled 2024-04-14: qty 100

## 2024-04-14 MED ORDER — DEXTROSE 50 % IV SOLN
INTRAVENOUS | Status: AC
  Filled 2024-04-14: qty 50

## 2024-04-14 NOTE — Plan of Care (Signed)

## 2024-04-14 NOTE — Progress Notes (Signed)
 RT NOTE:  RT turned pt HHNFC down from 30L to 20L and monitored sats and pt condition for a few minutes, pt saturations remained at 95% with the change. RT switched pt from [facility] to  [place] High Flow at 12L and then monitored pt sats and condition. Pt saturations have remained 95-97% since being placed on the [place]ter high flow. Pt states the change feels better. HHFNC remains at pt bedside on stand by. RN aware.

## 2024-04-14 NOTE — Progress Notes (Signed)
 PROGRESS NOTE    James [name]  FMW:[id number] DOB: 09-26-1955 DOA: 04/10/2024 PCP: Nichole Senior, MD    Brief Narrative:  45 anxiety depression, asthma COPD, coronary artery disease, type 1 diabetes on insulin , diabetic retinopathy nephrolithiasis, hypothyroidism hyperlipidemia, multiple recurrent pneumonia presented to ER with progressive worsening shortness of breath associated with productive cough, pleuritic chest pain, wheezing fatigue persistent despite outpatient treatment with antibiotics and steroids.  In the emergency room, WBC count 8.3.  COVID, influenza and RSV negative.  Potassium 3.3.  Chest x-ray and CT angiogram with middle and the right lower lobe pulmonary consolidation consistent with pneumonia.  Initially on 6 L of oxygen .  Admitted with pulmonary consultation.  Subjective: Patient seen and examined.  Today he looks comfortable.  Patient tells me that he slept well last night.  Cough has improved now.  Assessment & Plan:   Community-acquired pneumonia, right middle and lower lobe pneumonia failed outpatient therapy Acute hypoxemic respiratory failure secondary to COPD exacerbation due to pneumonia.  bronchodilator therapy, oral steroids, inhalational steroids, scheduled and as needed bronchodilators, deep breathing exercises, incentive spirometry, chest physiotherapy. Blood culture is negative so far.  Sputum cultures with mixed organism.  Likely respiratory flora. Remains on IV Zosyn  and azithromycin .  Will continue. Supplemental oxygen  to keep saturations more than 90%. Appreciate pulmonary input. Continue mobilizing.  On scheduled Mucinex , cough medications. Patient with significant aspiration.  Currently declines tube feeding or consideration for G-tube.  Will need extreme aspiration precautions.  Hypokalemia: Adequate.  Type 1 diabetes on insulin , uncontrolled with hyperglycemia secondary to steroid use: Hypoglycemia with poor oral intake.  Recurrent hypoglycemia  despite decreasing insulin  doses due to poor oral intake. Lantus  40-decreased to 30 today. Sliding scale insulin . Discontinue prandial insulin  today until he is oral intake is stabilized. Encourage nutrition.    BPH with urinary retention due to acute illness: Continue Foley catheter.  Will give voiding trial only when he is able to mobilize around independently.  Continue finasteride  and Flomax .  May go home with catheter with outpatient voiding trial if not successful.  Chronic medical issues including Hyperlipidemia, on a statin to continue Depression and insomnia, on trazodone .  Inadequate symptom relief.  Changed to Ativan  per the hospital. History of coronary artery disease, on aspirin  and simvastatin .  Stable. GERD, on Protonix . Hypothyroidism, on Synthroid .     DVT prophylaxis: enoxaparin  (LOVENOX ) injection 40 mg Start: 04/10/24 2200   Code Status: Full code Family Communication: None today. Disposition Plan: Status is: Inpatient Remains inpatient appropriate because: Severe significant symptoms, on high flow oxygen .     Consultants:  PCCM  Procedures:  None  Antimicrobials:  Rocephin  9/23-9/25 Azithromycin  9/23- IV Zosyn  9/25----     Objective: Vitals:   04/14/24 0810 04/14/24 1000 04/14/24 1100 04/14/24 1200  BP:    (!) 91/57  Pulse: 73 87 90 91  Resp: 11 (!) 27 (!) 21 (!) 23  Temp: 98.4 F (36.9 C)     TempSrc: Oral     SpO2: 96% 91% 95% 90%    Intake/Output Summary (Last 24 hours) at 04/14/2024 1245 Last data filed at 04/14/2024 1018 Gross per 24 hour  Intake 590.12 ml  Output 1645 ml  Net -1054.88 ml   There were no vitals filed for this visit.  Examination:  General exam: Looks comfortable today.  Able to talk in complete sentences. Respiratory system: Poor inspiratory effort.  No added sound.  Poor bilateral air entry. SpO2: 90 % O2 Flow Rate (L/min): (S) 14  L/min FiO2 (%): 45 %  Cardiovascular system: S1 & S2 heard, RRR. No JVD,  murmurs, rubs, gallops or clicks. No pedal edema. Gastrointestinal system: Abdomen is nondistended, soft and nontender. No organomegaly or masses felt. Normal bowel sounds heard. Foley catheter with clear urine. Central nervous system: Alert and oriented. No focal neurological deficits. Extremities: Symmetric 5 x 5 power. Skin: No rashes, lesions or ulcers    Data Reviewed: I have personally reviewed following labs and imaging studies  CBC: Recent Labs  Lab 04/10/24 1040 04/11/24 0320 04/12/24 0320 04/14/24 0247  WBC 8.3 9.1 12.3* 13.3*  NEUTROABS 5.8  --  9.9* 10.7*  HGB 13.0 12.1* 11.9* 12.9*  HCT 39.3 35.6* 34.9* 38.3*  MCV 90.1 90.8 91.1 91.4  PLT 431* 441* 451* 414*   Basic Metabolic Panel: Recent Labs  Lab 04/10/24 1040 04/11/24 0320 04/12/24 0320 04/14/24 0247  NA 134* 134* 140 136  K 3.3* 5.8* 3.3* 4.3  CL 98 101 104 99  CO2 20* 21* 22 27  GLUCOSE 253* 232* 102* 108*  BUN 14 14 17 19   CREATININE 0.80 0.74 0.77 0.86  CALCIUM 8.9 9.0 8.8* 9.1  MG 2.0  --  2.2 2.3  PHOS 2.0*  --  4.2  --    GFR: Estimated Creatinine Clearance: 81.1 mL/min (by C-G formula based on SCr of 0.86 mg/dL). Liver Function Tests: Recent Labs  Lab 04/11/24 0320 04/12/24 0320 04/14/24 0247  AST <10* 21 <10*  ALT 7 9 9   ALKPHOS 99 92 97  BILITOT 0.4 0.3 0.4  PROT 6.5 6.2* 6.5  ALBUMIN 3.2* 3.2* 3.3*   No results for input(s): LIPASE, AMYLASE in the last 168 hours. No results for input(s): AMMONIA in the last 168 hours. Coagulation Profile: No results for input(s): INR, PROTIME in the last 168 hours. Cardiac Enzymes: No results for input(s): CKTOTAL, CKMB, CKMBINDEX, TROPONINI in the last 168 hours. BNP (last 3 results) No results for input(s): PROBNP in the last 8760 hours. HbA1C: No results for input(s): HGBA1C in the last 72 hours. CBG: Recent Labs  Lab 04/14/24 0606 04/14/24 0623 04/14/24 0646 04/14/24 0815 04/14/24 1210  GLUCAP 55* 56*  140* 91 320*   Lipid Profile: No results for input(s): CHOL, HDL, LDLCALC, TRIG, CHOLHDL, LDLDIRECT in the last 72 hours. Thyroid  Function Tests: No results for input(s): TSH, T4TOTAL, FREET4, T3FREE, THYROIDAB in the last 72 hours. Anemia Panel: No results for input(s): VITAMINB12, FOLATE, FERRITIN, TIBC, IRON, RETICCTPCT in the last 72 hours. Sepsis Labs: No results for input(s): PROCALCITON, LATICACIDVEN in the last 168 hours.  Recent Results (from the past 240 hours)  Resp panel by RT-PCR (RSV, Flu A&B, Covid) Anterior Nasal Swab     Status: None   Collection Time: 04/10/24 10:31 AM   Specimen: Anterior Nasal Swab  Result Value Ref Range Status   SARS Coronavirus 2 by RT PCR NEGATIVE NEGATIVE Final    Comment: (NOTE) SARS-CoV-2 target nucleic acids are NOT DETECTED.  The SARS-CoV-2 RNA is generally detectable in upper respiratory specimens during the acute phase of infection. The lowest concentration of SARS-CoV-2 viral copies this assay can detect is 138 copies/mL. A negative result does not preclude SARS-Cov-2 infection and should not be used as the sole basis for treatment or other patient management decisions. A negative result may occur with  improper specimen collection/handling, submission of specimen other than nasopharyngeal swab, presence of viral mutation(s) within the areas targeted by this assay, and inadequate number of viral copies(<138 copies/mL).  A negative result must be combined with clinical observations, patient history, and epidemiological information. The expected result is Negative.  Fact Sheet for Patients:  BloggerCourse.com  Fact Sheet for Healthcare Providers:  SeriousBroker.it  This test is no t yet approved or cleared by the [place]  FDA and  has been authorized for detection and/or diagnosis of SARS-CoV-2 by FDA under an Emergency Use Authorization  (EUA). This EUA will remain  in effect (meaning this test can be used) for the duration of the COVID-19 declaration under Section 564(b)(1) of the Act, 21 U.S.C.section 360bbb-3(b)(1), unless the authorization is terminated  or revoked sooner.       Influenza A by PCR NEGATIVE NEGATIVE Final   Influenza B by PCR NEGATIVE NEGATIVE Final    Comment: (NOTE) The Xpert Xpress SARS-CoV-2/FLU/RSV plus assay is intended as an aid in the diagnosis of influenza from Nasopharyngeal swab specimens and should not be used as a sole basis for treatment. Nasal washings and aspirates are unacceptable for Xpert Xpress SARS-CoV-2/FLU/RSV testing.  Fact Sheet for Patients: BloggerCourse.com  Fact Sheet for Healthcare Providers: SeriousBroker.it  This test is not yet approved or cleared by the [place]  FDA and has been authorized for detection and/or diagnosis of SARS-CoV-2 by FDA under an Emergency Use Authorization (EUA). This EUA will remain in effect (meaning this test can be used) for the duration of the COVID-19 declaration under Section 564(b)(1) of the Act, 21 U.S.C. section 360bbb-3(b)(1), unless the authorization is terminated or revoked.     Resp Syncytial Virus by PCR NEGATIVE NEGATIVE Final    Comment: (NOTE) Fact Sheet for Patients: BloggerCourse.com  Fact Sheet for Healthcare Providers: SeriousBroker.it  This test is not yet approved or cleared by the [place]  FDA and has been authorized for detection and/or diagnosis of SARS-CoV-2 by FDA under an Emergency Use Authorization (EUA). This EUA will remain in effect (meaning this test can be used) for the duration of the COVID-19 declaration under Section 564(b)(1) of the Act, 21 U.S.C. section 360bbb-3(b)(1), unless the authorization is terminated or revoked.  Performed at [facility], [address] [place], [place] [address]   MRSA Next Gen by PCR, Nasal     Status: None   Collection Time: 04/10/24  3:11 PM   Specimen: Nasal Mucosa; Nasal Swab  Result Value Ref Range Status   MRSA by PCR Next Gen NOT DETECTED NOT DETECTED Final    Comment: (NOTE) The GeneXpert MRSA Assay (FDA approved for NASAL specimens only), is one component of a comprehensive MRSA colonization surveillance program. It is not intended to diagnose MRSA infection nor to guide or monitor treatment for MRSA infections. Test performance is not FDA approved in patients less than 58 years old. Performed at [facility], [address], [place], [place] [address]   Respiratory (~20 pathogens) panel by PCR     Status: None   Collection Time: 04/10/24  6:32 PM   Specimen: Nasopharyngeal Swab; Respiratory  Result Value Ref Range Status   Adenovirus NOT DETECTED NOT DETECTED Final   Coronavirus 229E NOT DETECTED NOT DETECTED Final    Comment: (NOTE) The Coronavirus on the Respiratory Panel, DOES NOT test for the novel  Coronavirus (2019 nCoV)    Coronavirus HKU1 NOT DETECTED NOT DETECTED Final   Coronavirus NL63 NOT DETECTED NOT DETECTED Final   Coronavirus OC43 NOT DETECTED NOT DETECTED Final   Metapneumovirus NOT DETECTED NOT DETECTED Final   Rhinovirus / Enterovirus NOT DETECTED NOT  DETECTED Final   Influenza A NOT DETECTED NOT DETECTED Final   Influenza B NOT DETECTED NOT DETECTED Final   Parainfluenza Virus 1 NOT DETECTED NOT DETECTED Final   Parainfluenza Virus 2 NOT DETECTED NOT DETECTED Final   Parainfluenza Virus 3 NOT DETECTED NOT DETECTED Final   Parainfluenza Virus 4 NOT DETECTED NOT DETECTED Final   Respiratory Syncytial Virus NOT DETECTED NOT DETECTED Final   Bordetella pertussis NOT DETECTED NOT DETECTED Final   Bordetella Parapertussis NOT DETECTED NOT DETECTED Final   Chlamydophila pneumoniae NOT DETECTED NOT DETECTED Final   Mycoplasma pneumoniae NOT DETECTED NOT DETECTED  Final    Comment: Performed at [facility], [address], [place], [place] [address]  Expectorated Sputum Assessment w Gram Stain, Rflx to Resp Cult     Status: None   Collection Time: 04/11/24 12:59 AM   Specimen: Expectorated Sputum  Result Value Ref Range Status   Specimen Description EXPECTORATED SPUTUM  Final   Special Requests EXPECTORATED SPUTUM  Final   Sputum evaluation   Final    THIS SPECIMEN IS ACCEPTABLE FOR SPUTUM CULTURE Performed at [facility], [address], [place], [place] [address]    Report Status 04/11/2024 FINAL  Final  Culture, Respiratory w Gram Stain     Status: None   Collection Time: 04/11/24 12:59 AM  Result Value Ref Range Status   Specimen Description   Final    EXPECTORATED SPUTUM Performed at [facility], [address], [place], [place] [address]    Special Requests   Final    EXPECTORATED SPUTUM Reflexed from [id number] Performed at [facility], [address], [place], [place] [address]    Gram Stain   Final    FEW WBC PRESENT, PREDOMINANTLY PMN FEW GRAM POSITIVE COCCI FEW GRAM NEGATIVE RODS    Culture   Final    FEW Normal respiratory flora-no Staph aureus or Pseudomonas seen Performed at [facility], [address] [place], [place] [address]    Report Status 04/13/2024 FINAL  Final         Radiology Studies: DG CHEST PORT 1 VIEW Result Date: 04/13/2024 CLINICAL DATA:  Pneumonia EXAM: PORTABLE CHEST 1 VIEW COMPARISON:  Chest radiographs April 11, 2024 FINDINGS: The heart size and mediastinal contours are within normal limits. Heterogeneous opacities involving the right middle and lower lobes more prominent in right perifissural region. Right basilar atelectasis/consolidation is less conspicuous on current exam and improved. Mild blunting of right costophrenic angle. No effusion on the left. Status post ACDF. The visualized skeletal structures are unremarkable.  IMPRESSION: Consolidative right pulmonary opacities now more conspicuous in right perifissural. Electronically Signed   By: Duwaine Severs M.D.   On: 04/13/2024 14:44         Scheduled Meds:  arformoterol   15 mcg Nebulization Q12H   aspirin  EC  81 mg Oral QHS   budesonide  (PULMICORT ) nebulizer solution  0.5 mg Nebulization BID   Chlorhexidine  Gluconate Cloth  6 each Topical Daily   dextrose        enoxaparin  (LOVENOX ) injection  40 mg Subcutaneous Q24H   feeding supplement  237 mL Oral BID BM   finasteride   5 mg Oral QHS   fluticasone   2 spray Each Nare BID   guaiFENesin   1,200 mg Oral BID   insulin  aspart  0-5 Units Subcutaneous QHS   insulin  aspart  0-9 Units Subcutaneous TID WC   insulin  glargine  20 Units  Subcutaneous Daily   levothyroxine   100 mcg Oral QHS   loratadine   10 mg Oral QHS   metoCLOPramide   5 mg Oral BID AC   pantoprazole   40 mg Oral QHS   potassium chloride   40 mEq Oral Daily   predniSONE   40 mg Oral Q breakfast   revefenacin   175 mcg Nebulization Daily   simvastatin   40 mg Oral Daily   tamsulosin   0.4 mg Oral QHS   Continuous Infusions:  piperacillin -tazobactam (ZOSYN )  IV 3.375 g (04/14/24 1012)     LOS: 4 days    Time spent: 52 minutes    Renato Applebaum, MD [facility]

## 2024-04-14 NOTE — Progress Notes (Signed)
 NAME:  James [name], MRN:  [id number], DOB:  11/25/1955, LOS: 4 ADMISSION DATE:  04/10/2024, CONSULTATION DATE:  9/23 REFERRING MD:  Dr. Celinda, CHIEF COMPLAINT:  PNA; acute respiratory failure w/ hypoxia   History of Present Illness:  Patient is a 68 yo M w/ pertinent PMH asthma, multiple episodes of pna (seen by Dr. Jude outpt), anxiety, depression, T1DM, HLD, hypothyroidism, GERD, CKD, seizures presents to [facility] on 9/23 w/ hypoxic respiratory failure and pna.  Patient has hx of recurrent pna. Recently treated in 01/2024 w/ clinda and doxy w/ course of prednisone . States has cough in morning feels like he may be aspirating. Denies fever. Having worsening sob over the past month. On 9/23 checked pulse ox at home w/ sats in 70s. EMS called and placed on o2. Gave nebs and steroids. Transported to [facility] ED. Placed on salter [place]. Afebrile and wbc 8. Covid/flu/rsv and mrsa pcr negative. CXR w/ RLL pna. CTA chest negative for PE; RML and RLL consolidation. Given rocephin zenovia. PCCM consulted.  Pertinent  Medical History   Past Medical History:  Diagnosis Date   Anxiety    Asthma    ASTHMATIC BRONCHITIS, ACUTE 10/25/2008   Bladder neck obstruction    CARPAL TUNNEL SYNDROME, BILATERAL 07/31/2007   issues resolved, no surgery   Cervical disc disease    CORONARY ARTERY DISEASE 04/03/2007   Depression    DIABETES MELLITUS, TYPE I 04/03/2007   Diabetic retinopathy associated with diabetes mellitus due to underlying condition (HCC) 04/03/2007   DM W/EYE MANIFESTATIONS, TYPE I, UNCONTROLLED 04/04/2007   DM W/RENAL MNFST, TYPE I, UNCONTROLLED 04/04/2007   ED (erectile dysfunction)    History of kidney stones    HYPERLIPIDEMIA 04/04/2007   Hypothyroidism    Pneumonia    several times and again today (02/13/2104)   Renal insufficiency    Seizures (HCC)    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 Bumped up to 70% fio2 on 35 l/m 9/26 Remains on HHFNC at 40L, ongoing issues with coughing spells but improved  Interim History / Subjective:  Feels better, less cough, O2 weaning  Objective    Blood pressure 135/61, pulse 67, temperature 98.4 F (36.9 C), temperature source Oral, resp. rate 18, SpO2 93%.    FiO2 (%):  [40 %-60 %] 45 %   Intake/Output Summary (Last 24 hours) at 04/14/2024 0806 Last data filed at 04/14/2024 9370 Gross per 24 hour  Intake 710.12 ml  Output 2125 ml  Net -1414.88 ml   There were no vitals filed for this visit.  Examination: General: Acute on chronically ill appearing middle aged male sitting up in bed, in NAD HEENT: [place]/AT HHFNC in place , MM pink/moist, PERRL,  Neuro: Alert and oriented x3, non-focal  CV: warm no edema  PULM: NWOB on O2 GI: ND Skin: no rashes or lesions  Resolved problem list   Assessment and Plan   Acute respiratory failure w/ hypoxia RML/RLL PNA -patient states he has been having concerns of difficulty swallowing and aspirating ever since his spinal surgeries. Has not been able to be seen by SLP yet  Hx of asthma -rvp legionella and strep negative -exp sputum with OP  flora P: Continue HHFNC , wean for goal O2 sat 90% Antibiotics escalated to Zosyn  9/25, continue plan total 7 days of abx LAM LABA ICS, PRN SABA/SAMA Aggressive pulmonary hygiene  Mobilize as able  Complete course 5 days prednisone  40 mg S/p aggressive diuresis, start albumin infusion with low Bps in afternoon, can give additional bolus if needed based on response As needed cough suppressants   Critical care time: NA   Donnice JONELLE Beals, MD [facility] Personal contact information can be found on Amion  If no contact or response  made please call [phone] 04/14/2024, 8:06 AM

## 2024-04-15 DIAGNOSIS — J9601 Acute respiratory failure with hypoxia: Secondary | ICD-10-CM | POA: Diagnosis not present

## 2024-04-15 LAB — GLUCOSE, CAPILLARY
Glucose-Capillary: 254 mg/dL — ABNORMAL HIGH (ref 70–99)
Glucose-Capillary: 259 mg/dL — ABNORMAL HIGH (ref 70–99)
Glucose-Capillary: 300 mg/dL — ABNORMAL HIGH (ref 70–99)
Glucose-Capillary: 265 mg/dL — ABNORMAL HIGH (ref 70–99)

## 2024-04-15 MED ORDER — BETHANECHOL CHLORIDE 10 MG PO TABS: 10.0000 mg | ORAL_TABLET | Freq: Three times a day (TID) | ORAL | Status: DC

## 2024-04-15 MED ORDER — TAMSULOSIN HCL 0.4 MG PO CAPS
0.8000 mg | ORAL_CAPSULE | Freq: Every day | ORAL | Status: DC
  Administered 2024-04-15 – 2024-04-17 (×3): 0.8 mg via ORAL
  Filled 2024-04-15 (×3): qty 2

## 2024-04-15 MED ORDER — INSULIN ASPART 100 UNIT/ML IJ SOLN
4.0000 [IU] | Freq: Three times a day (TID) | INTRAMUSCULAR | Status: DC
  Administered 2024-04-15 – 2024-04-18 (×8): 4 [IU] via SUBCUTANEOUS

## 2024-04-15 NOTE — Plan of Care (Signed)

## 2024-04-15 NOTE — Progress Notes (Signed)
 PROGRESS NOTE    James [name]  FMW:[id number] DOB: [birth year] DOA: 04/10/2024 PCP: Nichole Senior, MD    Brief Narrative:  50 anxiety depression, asthma COPD, coronary artery disease, type 1 diabetes on insulin , diabetic retinopathy nephrolithiasis, hypothyroidism hyperlipidemia, multiple recurrent pneumonia presented to ER with progressive worsening shortness of breath associated with productive cough, pleuritic chest pain, wheezing fatigue persistent despite outpatient treatment with antibiotics and steroids.  In the emergency room, WBC count 8.3.  COVID, influenza and RSV negative.  Potassium 3.3.  Chest x-ray and CT angiogram with middle and the right lower lobe pulmonary consolidation consistent with pneumonia.  Initially on 6 L of oxygen .  Admitted with pulmonary consultation.  Subjective: Patient seen and examined.  Today he looks more interactive and comfortable.  Currently on 6 to 8 L of oxygen .  Anticipating to walk today.  Cough and chest pain is better.  Afebrile.  Assessment & Plan:   Community-acquired pneumonia, right middle and lower lobe pneumonia failed outpatient therapy Acute hypoxemic respiratory failure secondary to COPD exacerbation due to pneumonia.  bronchodilator therapy, oral steroids, inhalational steroids, scheduled and as needed bronchodilators, deep breathing exercises, incentive spirometry, chest physiotherapy. Blood culture is negative so far.  Sputum cultures with mixed organism.  Likely respiratory flora. Remains on IV Zosyn .  Completed azithromycin  for 5 days.  Supplemental oxygen  to keep saturations more than 90%. Appreciate pulmonary input. Continue mobilizing.  On scheduled Mucinex , cough medications. Patient with significant aspiration.  Speech therapy following.  Anticipating modified barium swallow.  Hypokalemia: Replaced and adequate.  Type 1 diabetes on insulin , uncontrolled with hyperglycemia secondary to steroid use: Hypoglycemia with poor  oral intake.   Now appetite is improving.  Had episodes of hypoglycemia.  Will avoid high dose insulin . Keep on current 20 initial long-acting insulin .  Will add prandial insulin  today.  Keep on sliding scale.  BPH with urinary retention due to acute illness: Continue Foley catheter.  Will give voiding trial only when he is able to mobilize around independently.  Continue finasteride  and Flomax .  May go home with catheter with outpatient voiding trial if not successful.  Chronic medical issues including Hyperlipidemia, on a statin to continue Depression and insomnia, on trazodone .  Inadequate symptom relief.  Changed to Ativan  per the hospital. History of coronary artery disease, on aspirin  and simvastatin .  Stable. GERD, on Protonix . Hypothyroidism, on Synthroid .     DVT prophylaxis: enoxaparin  (LOVENOX ) injection 40 mg Start: 04/10/24 2200   Code Status: Full code Family Communication: None today. Disposition Plan: Status is: Inpatient Remains inpatient appropriate because: Severe significant symptoms, on high flow oxygen .     Consultants:  PCCM  Procedures:  None  Antimicrobials:  Rocephin  9/23-9/25 Azithromycin  9/23-9/28 IV Zosyn  9/25----     Objective: Vitals:   04/15/24 0904 04/15/24 0909 04/15/24 0915 04/15/24 1000  BP:   119/61 (!) 116/54  Pulse:   72 72  Resp:   (!) 21 20  Temp:      TempSrc:      SpO2: 94% 94% 95% 97%    Intake/Output Summary (Last 24 hours) at 04/15/2024 1058 Last data filed at 04/15/2024 1017 Gross per 24 hour  Intake 773.63 ml  Output 1125 ml  Net -351.37 ml   There were no vitals filed for this visit.  Examination:  General exam: Looks fairly comfortable.  Able to talk in complete sentences. Respiratory system: Poor inspiratory effort.  No added sound.   SpO2: 97 % O2 Flow Rate (L/min): 8  L/min (per sats) FiO2 (%): 45 %  Cardiovascular system: S1 & S2 heard, RRR. No JVD, murmurs, rubs, gallops or clicks. No pedal  edema. Gastrointestinal system: Abdomen is nondistended, soft and nontender. No organomegaly or masses felt. Normal bowel sounds heard. Foley catheter with clear urine. Central nervous system: Alert and oriented. No focal neurological deficits. Extremities: Symmetric 5 x 5 power. Skin: No rashes, lesions or ulcers    Data Reviewed: I have personally reviewed following labs and imaging studies  CBC: Recent Labs  Lab 04/10/24 1040 04/11/24 0320 04/12/24 0320 04/14/24 0247  WBC 8.3 9.1 12.3* 13.3*  NEUTROABS 5.8  --  9.9* 10.7*  HGB 13.0 12.1* 11.9* 12.9*  HCT 39.3 35.6* 34.9* 38.3*  MCV 90.1 90.8 91.1 91.4  PLT 431* 441* 451* 414*   Basic Metabolic Panel: Recent Labs  Lab 04/10/24 1040 04/11/24 0320 04/12/24 0320 04/14/24 0247  NA 134* 134* 140 136  K 3.3* 5.8* 3.3* 4.3  CL 98 101 104 99  CO2 20* 21* 22 27  GLUCOSE 253* 232* 102* 108*  BUN 14 14 17 19   CREATININE 0.80 0.74 0.77 0.86  CALCIUM 8.9 9.0 8.8* 9.1  MG 2.0  --  2.2 2.3  PHOS 2.0*  --  4.2  --    GFR: Estimated Creatinine Clearance: 81.1 mL/min (by C-G formula based on SCr of 0.86 mg/dL). Liver Function Tests: Recent Labs  Lab 04/11/24 0320 04/12/24 0320 04/14/24 0247  AST <10* 21 <10*  ALT 7 9 9   ALKPHOS 99 92 97  BILITOT 0.4 0.3 0.4  PROT 6.5 6.2* 6.5  ALBUMIN 3.2* 3.2* 3.3*   No results for input(s): LIPASE, AMYLASE in the last 168 hours. No results for input(s): AMMONIA in the last 168 hours. Coagulation Profile: No results for input(s): INR, PROTIME in the last 168 hours. Cardiac Enzymes: No results for input(s): CKTOTAL, CKMB, CKMBINDEX, TROPONINI in the last 168 hours. BNP (last 3 results) No results for input(s): PROBNP in the last 8760 hours. HbA1C: No results for input(s): HGBA1C in the last 72 hours. CBG: Recent Labs  Lab 04/14/24 1210 04/14/24 1605 04/14/24 1703 04/14/24 2128 04/15/24 0836  GLUCAP 320* 359* 404* 246* 254*   Lipid Profile: No  results for input(s): CHOL, HDL, LDLCALC, TRIG, CHOLHDL, LDLDIRECT in the last 72 hours. Thyroid  Function Tests: No results for input(s): TSH, T4TOTAL, FREET4, T3FREE, THYROIDAB in the last 72 hours. Anemia Panel: No results for input(s): VITAMINB12, FOLATE, FERRITIN, TIBC, IRON, RETICCTPCT in the last 72 hours. Sepsis Labs: No results for input(s): PROCALCITON, LATICACIDVEN in the last 168 hours.  Recent Results (from the past 240 hours)  Resp panel by RT-PCR (RSV, Flu A&B, Covid) Anterior Nasal Swab     Status: None   Collection Time: 04/10/24 10:31 AM   Specimen: Anterior Nasal Swab  Result Value Ref Range Status   SARS Coronavirus 2 by RT PCR NEGATIVE NEGATIVE Final    Comment: (NOTE) SARS-CoV-2 target nucleic acids are NOT DETECTED.  The SARS-CoV-2 RNA is generally detectable in upper respiratory specimens during the acute phase of infection. The lowest concentration of SARS-CoV-2 viral copies this assay can detect is 138 copies/mL. A negative result does not preclude SARS-Cov-2 infection and should not be used as the sole basis for treatment or other patient management decisions. A negative result may occur with  improper specimen collection/handling, submission of specimen other than nasopharyngeal swab, presence of viral mutation(s) within the areas targeted by this assay, and inadequate number of viral  copies(<138 copies/mL). A negative result must be combined with clinical observations, patient history, and epidemiological information. The expected result is Negative.  Fact Sheet for Patients:  BloggerCourse.com  Fact Sheet for Healthcare Providers:  SeriousBroker.it  This test is no t yet approved or cleared by the [place]  FDA and  has been authorized for detection and/or diagnosis of SARS-CoV-2 by FDA under an Emergency Use Authorization (EUA). This EUA will remain  in  effect (meaning this test can be used) for the duration of the COVID-19 declaration under Section 564(b)(1) of the Act, 21 U.S.C.section 360bbb-3(b)(1), unless the authorization is terminated  or revoked sooner.       Influenza A by PCR NEGATIVE NEGATIVE Final   Influenza B by PCR NEGATIVE NEGATIVE Final    Comment: (NOTE) The Xpert Xpress SARS-CoV-2/FLU/RSV plus assay is intended as an aid in the diagnosis of influenza from Nasopharyngeal swab specimens and should not be used as a sole basis for treatment. Nasal washings and aspirates are unacceptable for Xpert Xpress SARS-CoV-2/FLU/RSV testing.  Fact Sheet for Patients: BloggerCourse.com  Fact Sheet for Healthcare Providers: SeriousBroker.it  This test is not yet approved or cleared by the [place]  FDA and has been authorized for detection and/or diagnosis of SARS-CoV-2 by FDA under an Emergency Use Authorization (EUA). This EUA will remain in effect (meaning this test can be used) for the duration of the COVID-19 declaration under Section 564(b)(1) of the Act, 21 U.S.C. section 360bbb-3(b)(1), unless the authorization is terminated or revoked.     Resp Syncytial Virus by PCR NEGATIVE NEGATIVE Final    Comment: (NOTE) Fact Sheet for Patients: BloggerCourse.com  Fact Sheet for Healthcare Providers: SeriousBroker.it  This test is not yet approved or cleared by the [place]  FDA and has been authorized for detection and/or diagnosis of SARS-CoV-2 by FDA under an Emergency Use Authorization (EUA). This EUA will remain in effect (meaning this test can be used) for the duration of the COVID-19 declaration under Section 564(b)(1) of the Act, 21 U.S.C. section 360bbb-3(b)(1), unless the authorization is terminated or revoked.  Performed at [facility], [address] [place], [place] [address]    MRSA Next Gen by PCR, Nasal     Status: None   Collection Time: 04/10/24  3:11 PM   Specimen: Nasal Mucosa; Nasal Swab  Result Value Ref Range Status   MRSA by PCR Next Gen NOT DETECTED NOT DETECTED Final    Comment: (NOTE) The GeneXpert MRSA Assay (FDA approved for NASAL specimens only), is one component of a comprehensive MRSA colonization surveillance program. It is not intended to diagnose MRSA infection nor to guide or monitor treatment for MRSA infections. Test performance is not FDA approved in patients less than 73 years old. Performed at [facility], [address], [place], [place] [address]   Respiratory (~20 pathogens) panel by PCR     Status: None   Collection Time: 04/10/24  6:32 PM   Specimen: Nasopharyngeal Swab; Respiratory  Result Value Ref Range Status   Adenovirus NOT DETECTED NOT DETECTED Final   Coronavirus 229E NOT DETECTED NOT DETECTED Final    Comment: (NOTE) The Coronavirus on the Respiratory Panel, DOES NOT test for the novel  Coronavirus (2019 nCoV)    Coronavirus HKU1 NOT DETECTED NOT DETECTED Final   Coronavirus NL63 NOT DETECTED NOT DETECTED Final   Coronavirus OC43 NOT DETECTED NOT DETECTED Final   Metapneumovirus NOT DETECTED NOT DETECTED Final   Rhinovirus / Enterovirus NOT  DETECTED NOT DETECTED Final   Influenza A NOT DETECTED NOT DETECTED Final   Influenza B NOT DETECTED NOT DETECTED Final   Parainfluenza Virus 1 NOT DETECTED NOT DETECTED Final   Parainfluenza Virus 2 NOT DETECTED NOT DETECTED Final   Parainfluenza Virus 3 NOT DETECTED NOT DETECTED Final   Parainfluenza Virus 4 NOT DETECTED NOT DETECTED Final   Respiratory Syncytial Virus NOT DETECTED NOT DETECTED Final   Bordetella pertussis NOT DETECTED NOT DETECTED Final   Bordetella Parapertussis NOT DETECTED NOT DETECTED Final   Chlamydophila pneumoniae NOT DETECTED NOT DETECTED Final   Mycoplasma pneumoniae NOT DETECTED NOT DETECTED Final    Comment: Performed at  [facility], [address], [place], [place] [address]  Expectorated Sputum Assessment w Gram Stain, Rflx to Resp Cult     Status: None   Collection Time: 04/11/24 12:59 AM   Specimen: Expectorated Sputum  Result Value Ref Range Status   Specimen Description EXPECTORATED SPUTUM  Final   Special Requests EXPECTORATED SPUTUM  Final   Sputum evaluation   Final    THIS SPECIMEN IS ACCEPTABLE FOR SPUTUM CULTURE Performed at [facility], [address] [place], [place] [address]    Report Status 04/11/2024 FINAL  Final  Culture, Respiratory w Gram Stain     Status: None   Collection Time: 04/11/24 12:59 AM  Result Value Ref Range Status   Specimen Description   Final    EXPECTORATED SPUTUM Performed at [facility], [address] [place], [place] [address]    Special Requests   Final    EXPECTORATED SPUTUM Reflexed from [id number] Performed at [facility], [address] [place], [place] [address]    Gram Stain   Final    FEW WBC PRESENT, PREDOMINANTLY PMN FEW GRAM POSITIVE COCCI FEW GRAM NEGATIVE RODS    Culture   Final    FEW Normal respiratory flora-no Staph aureus or Pseudomonas seen Performed at [facility], [address] [place], [place] [address]    Report Status 04/13/2024 FINAL  Final         Radiology Studies: No results found.        Scheduled Meds:  arformoterol   15 mcg Nebulization Q12H   aspirin  EC  81 mg Oral QHS   budesonide  (PULMICORT ) nebulizer solution  0.5 mg Nebulization BID   Chlorhexidine  Gluconate Cloth  6 each Topical Daily   enoxaparin  (LOVENOX ) injection  40 mg Subcutaneous Q24H   feeding supplement  237 mL Oral BID BM   finasteride   5 mg Oral QHS   fluticasone   2 spray Each Nare BID   guaiFENesin   1,200 mg Oral BID   insulin  aspart  0-5 Units Subcutaneous QHS   insulin  aspart  0-9 Units Subcutaneous TID WC   insulin  aspart  4 Units Subcutaneous TID WC    insulin  glargine  20 Units Subcutaneous Daily   levothyroxine   100 mcg Oral QHS   loratadine   10 mg Oral QHS   metoCLOPramide   5 mg Oral BID AC   pantoprazole   40 mg Oral QHS   potassium chloride   40 mEq Oral Daily   revefenacin   175 mcg Nebulization Daily   simvastatin   40 mg Oral Daily   tamsulosin   0.4 mg Oral QHS   Continuous Infusions:  piperacillin -tazobactam (ZOSYN )  IV 3.375 g (04/15/24 1004)     LOS: 5 days    Time spent: 52 minutes    Safia Panzer  [place], MD [facility]

## 2024-04-15 NOTE — Progress Notes (Signed)
 NAME:  James [name], MRN:  [id number], DOB:  09-27-1955, LOS: 5 ADMISSION DATE:  04/10/2024, CONSULTATION DATE:  9/23 REFERRING MD:  Dr. Celinda, CHIEF COMPLAINT:  PNA; acute respiratory failure w/ hypoxia   History of Present Illness:  Patient is a 68 yo M w/ pertinent PMH asthma, multiple episodes of pna (seen by Dr. Jude outpt), anxiety, depression, T1DM, HLD, hypothyroidism, GERD, CKD, seizures presents to [facility] on 9/23 w/ hypoxic respiratory failure and pna.  Patient has hx of recurrent pna. Recently treated in 01/2024 w/ clinda and doxy w/ course of prednisone . States has cough in morning feels like he may be aspirating. Denies fever. Having worsening sob over the past month. On 9/23 checked pulse ox at home w/ sats in 70s. EMS called and placed on o2. Gave nebs and steroids. Transported to [facility] ED. Placed on [facility]. Afebrile and wbc 8. Covid/flu/rsv and mrsa pcr negative. CXR w/ RLL pna. CTA chest negative for PE; RML and RLL consolidation. Given rocephin zenovia. PCCM consulted.  Pertinent  Medical History   Past Medical History:  Diagnosis Date   Anxiety    Asthma    ASTHMATIC BRONCHITIS, ACUTE 10/25/2008   Bladder neck obstruction    CARPAL TUNNEL SYNDROME, BILATERAL 07/31/2007   issues resolved, no surgery   Cervical disc disease    CORONARY ARTERY DISEASE 04/03/2007   Depression    DIABETES MELLITUS, TYPE I 04/03/2007   Diabetic retinopathy associated with diabetes mellitus due to underlying condition (HCC) 04/03/2007   DM W/EYE MANIFESTATIONS, TYPE I, UNCONTROLLED 04/04/2007   DM W/RENAL MNFST, TYPE I, UNCONTROLLED 04/04/2007   ED (erectile dysfunction)    History of kidney stones    HYPERLIPIDEMIA 04/04/2007   Hypothyroidism    Pneumonia    several times and again today (02/13/2104)   Renal insufficiency    Seizures (HCC)    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 Bumped up to 70% fio2 on 35 l/m 9/26 Remains on [facility] at 40L, ongoing issues with coughing spells but improved 9/27 weaned to [facility] from [facility]  Interim History / Subjective:  O2 getting better  Objective    Blood pressure (!) 146/62, pulse 66, temperature 97.7 F (36.5 C), temperature source Oral, resp. rate (!) 27, SpO2 95%.        Intake/Output Summary (Last 24 hours) at 04/15/2024 0806 Last data filed at 04/14/2024 1915 Gross per 24 hour  Intake 676.44 ml  Output 1225 ml  Net -548.56 ml   There were no vitals filed for this visit.  Examination: General: Chronically ill appearing middle aged male sitting up in bed, in NAD HEENT: [place]/AT [place] in place , MM pink/moist, PERRL,  Neuro: Alert and oriented x3, non-focal  CV: warm no edema  PULM: NWOB on O2 GI: ND Skin: no rashes or lesions  Resolved problem list   Assessment and Plan   Acute respiratory failure w/ hypoxia RML/RLL PNA -patient states he has been having concerns of difficulty swallowing and aspirating ever since his spinal surgeries. Has not been able to be seen by SLP yet  Hx of asthma -rvp legionella and strep negative -exp sputum with OP flora P: Continue  O2, wean for goal O2 sat 90% Antibiotics escalated to Zosyn  9/25, continue plan total 7 days of abx LAM LABA ICS, PRN SABA/SAMA Aggressive pulmonary hygiene  Mobilize as able  Completed course 5 days prednisone  40 mg Hold further diuresis As needed cough suppressants  Needs better BP control per primary  Critical care time: NA   Donnice JONELLE Beals, MD [facility] Personal contact information can be found on Amion  If no contact or response made please call [phone] 04/15/2024, 8:06 AM

## 2024-04-15 NOTE — Progress Notes (Signed)
 SLP Cancellation Note  Patient Details Name: James [name] MRN: [id number] DOB: [birth year]   Cancelled treatment:       Reason Eval/Treat Not Completed: Note pt is now on 8L via [facility] but radiology's staffing does not support completion of MBS today. SLP will f/u next date.    Damien Blumenthal, M.A., CCC-SLP Speech Language Pathology, Acute Rehabilitation Services  Secure Chat preferred [phone]  04/15/2024, 10:32 AM

## 2024-04-16 ENCOUNTER — Inpatient Hospital Stay (HOSPITAL_COMMUNITY)

## 2024-04-16 DIAGNOSIS — T17908A Unspecified foreign body in respiratory tract, part unspecified causing other injury, initial encounter: Secondary | ICD-10-CM

## 2024-04-16 DIAGNOSIS — J9601 Acute respiratory failure with hypoxia: Secondary | ICD-10-CM | POA: Diagnosis not present

## 2024-04-16 LAB — GLUCOSE, CAPILLARY
Glucose-Capillary: 274 mg/dL — ABNORMAL HIGH (ref 70–99)
Glucose-Capillary: 283 mg/dL — ABNORMAL HIGH (ref 70–99)
Glucose-Capillary: 186 mg/dL — ABNORMAL HIGH (ref 70–99)
Glucose-Capillary: 148 mg/dL — ABNORMAL HIGH (ref 70–99)

## 2024-04-16 MED ORDER — HYDRALAZINE HCL 20 MG/ML IJ SOLN
10.0000 mg | Freq: Four times a day (QID) | INTRAMUSCULAR | Status: DC | PRN
Start: 2024-04-16 — End: 2024-04-18
  Filled 2024-04-16: qty 1

## 2024-04-16 MED ORDER — DIPHENHYDRAMINE HCL 25 MG PO CAPS
25.0000 mg | ORAL_CAPSULE | Freq: Four times a day (QID) | ORAL | Status: DC | PRN
  Administered 2024-04-16 – 2024-04-17 (×2): 25 mg via ORAL
  Filled 2024-04-16 (×3): qty 1

## 2024-04-16 NOTE — Progress Notes (Signed)
 PROGRESS NOTE    James [name]  FMW:[id number] DOB: [birth year] DOA: 04/10/2024 PCP: Nichole Senior, MD    Brief Narrative:  6 anxiety depression, asthma COPD, coronary artery disease, type 1 diabetes on insulin , diabetic retinopathy nephrolithiasis, hypothyroidism hyperlipidemia, multiple recurrent pneumonia presented to ER with progressive worsening shortness of breath associated with productive cough, pleuritic chest pain, wheezing fatigue persistent despite outpatient treatment with antibiotics and steroids.  In the emergency room, WBC count 8.3.  COVID, influenza and RSV negative.  Potassium 3.3.  Chest x-ray and CT angiogram with middle and the right lower lobe pulmonary consolidation consistent with pneumonia.  Initially on 6 L of oxygen .  Admitted with pulmonary consultation. Patient had developed more respiratory distress, was on high flow oxygen  and remained in the stepdown unit. 9/29, on 4 L oxygen .  Clinically improving.  Subjective: Patient and examined along with PCCM.  Slept well.  Some dry cough present.  Per swallow evaluation today.  Really does not like to go home with oxygen .  Assessment & Plan:   Community-acquired pneumonia, right middle and lower lobe pneumonia failed outpatient therapy Acute hypoxemic respiratory failure secondary to COPD exacerbation due to pneumonia.  Bronchodilator therapy, tapering of steroids.  Chest physiotherapy.   Completed 5 days of azithromycin .   Will complete 6 days of Zosyn  9/30.   Mobilize around.   Modified barium swallow today.   Aspiration precautions.  Monitor for ambulatory oxygen  need.   Anticipate home on prolonged antibiotic therapy and oxygen .  Hypokalemia: Replaced and adequate.  Type 1 diabetes on insulin , uncontrolled with hyperglycemia secondary to steroid use: Hypoglycemia with poor oral intake.   Now appetite is improving.  Had episodes of hypoglycemia.  Will avoid high dose insulin . Keep on current 20 initial  long-acting insulin . prandial insulin  today.  Keep on sliding scale.  BPH with urinary retention due to acute illness: Continue Foley catheter.  Will give voiding trial only when he is able to mobilize around independently.  Continue finasteride  and Flomax .  May go home with catheter with outpatient voiding trial if not successful.  Chronic medical issues including Hyperlipidemia, on a statin to continue Depression and insomnia, on trazodone .  Inadequate symptom relief.  Changed to Ativan  per the hospital. History of coronary artery disease, on aspirin  and simvastatin .  Stable. GERD, on Protonix . Hypothyroidism, on Synthroid .     DVT prophylaxis: enoxaparin  (LOVENOX ) injection 40 mg Start: 04/10/24 2200   Code Status: Full code Family Communication: None today. Disposition Plan: Status is: Inpatient Remains inpatient appropriate because: Severe significant symptoms, on IV antibiotics.     Consultants:  PCCM  Procedures:  None  Antimicrobials:  Rocephin  9/23-9/25 Azithromycin  9/23-9/28 IV Zosyn  9/25----     Objective: Vitals:   04/16/24 0730 04/16/24 0800 04/16/24 0843 04/16/24 0943  BP:  (!) 144/62    Pulse:  64    Resp:  (!) 23    Temp: 97.6 F (36.4 C)     TempSrc: Oral     SpO2:  94% 96%   Height:    5' 9 (1.753 m)    Intake/Output Summary (Last 24 hours) at 04/16/2024 1020 Last data filed at 04/16/2024 0400 Gross per 24 hour  Intake 131.03 ml  Output --  Net 131.03 ml   There were no vitals filed for this visit.  Examination:  General: Looks fairly comfortable.  Pleasant and interactive. Cardiovascular: S1-S2 normal.  Regular rate rhythm. Respiratory: Occasional crackles right posterior lung field.  Good air entry. Gastrointestinal: Soft.  Nontender.  Bowel sound present. Ext: No swelling or edema. Neuro: Alert awake and oriented.    Data Reviewed: I have personally reviewed following labs and imaging studies  CBC: Recent Labs  Lab  04/10/24 1040 04/11/24 0320 04/12/24 0320 04/14/24 0247  WBC 8.3 9.1 12.3* 13.3*  NEUTROABS 5.8  --  9.9* 10.7*  HGB 13.0 12.1* 11.9* 12.9*  HCT 39.3 35.6* 34.9* 38.3*  MCV 90.1 90.8 91.1 91.4  PLT 431* 441* 451* 414*   Basic Metabolic Panel: Recent Labs  Lab 04/10/24 1040 04/11/24 0320 04/12/24 0320 04/14/24 0247  NA 134* 134* 140 136  K 3.3* 5.8* 3.3* 4.3  CL 98 101 104 99  CO2 20* 21* 22 27  GLUCOSE 253* 232* 102* 108*  BUN 14 14 17 19   CREATININE 0.80 0.74 0.77 0.86  CALCIUM 8.9 9.0 8.8* 9.1  MG 2.0  --  2.2 2.3  PHOS 2.0*  --  4.2  --    GFR: Estimated Creatinine Clearance: 81.1 mL/min (by C-G formula based on SCr of 0.86 mg/dL). Liver Function Tests: Recent Labs  Lab 04/11/24 0320 04/12/24 0320 04/14/24 0247  AST <10* 21 <10*  ALT 7 9 9   ALKPHOS 99 92 97  BILITOT 0.4 0.3 0.4  PROT 6.5 6.2* 6.5  ALBUMIN 3.2* 3.2* 3.3*   No results for input(s): LIPASE, AMYLASE in the last 168 hours. No results for input(s): AMMONIA in the last 168 hours. Coagulation Profile: No results for input(s): INR, PROTIME in the last 168 hours. Cardiac Enzymes: No results for input(s): CKTOTAL, CKMB, CKMBINDEX, TROPONINI in the last 168 hours. BNP (last 3 results) No results for input(s): PROBNP in the last 8760 hours. HbA1C: No results for input(s): HGBA1C in the last 72 hours. CBG: Recent Labs  Lab 04/15/24 0836 04/15/24 1122 04/15/24 1723 04/15/24 2157 04/16/24 0731  GLUCAP 254* 259* 300* 265* 274*   Lipid Profile: No results for input(s): CHOL, HDL, LDLCALC, TRIG, CHOLHDL, LDLDIRECT in the last 72 hours. Thyroid  Function Tests: No results for input(s): TSH, T4TOTAL, FREET4, T3FREE, THYROIDAB in the last 72 hours. Anemia Panel: No results for input(s): VITAMINB12, FOLATE, FERRITIN, TIBC, IRON, RETICCTPCT in the last 72 hours. Sepsis Labs: No results for input(s): PROCALCITON, LATICACIDVEN in the last 168  hours.  Recent Results (from the past 240 hours)  Resp panel by RT-PCR (RSV, Flu A&B, Covid) Anterior Nasal Swab     Status: None   Collection Time: 04/10/24 10:31 AM   Specimen: Anterior Nasal Swab  Result Value Ref Range Status   SARS Coronavirus 2 by RT PCR NEGATIVE NEGATIVE Final    Comment: (NOTE) SARS-CoV-2 target nucleic acids are NOT DETECTED.  The SARS-CoV-2 RNA is generally detectable in upper respiratory specimens during the acute phase of infection. The lowest concentration of SARS-CoV-2 viral copies this assay can detect is 138 copies/mL. A negative result does not preclude SARS-Cov-2 infection and should not be used as the sole basis for treatment or other patient management decisions. A negative result may occur with  improper specimen collection/handling, submission of specimen other than nasopharyngeal swab, presence of viral mutation(s) within the areas targeted by this assay, and inadequate number of viral copies(<138 copies/mL). A negative result must be combined with clinical observations, patient history, and epidemiological information. The expected result is Negative.  Fact Sheet for Patients:  BloggerCourse.com  Fact Sheet for Healthcare Providers:  SeriousBroker.it  This test is no t yet approved or cleared by the [place]  FDA and  has  been authorized for detection and/or diagnosis of SARS-CoV-2 by FDA under an Emergency Use Authorization (EUA). This EUA will remain  in effect (meaning this test can be used) for the duration of the COVID-19 declaration under Section 564(b)(1) of the Act, 21 U.S.C.section 360bbb-3(b)(1), unless the authorization is terminated  or revoked sooner.       Influenza A by PCR NEGATIVE NEGATIVE Final   Influenza B by PCR NEGATIVE NEGATIVE Final    Comment: (NOTE) The Xpert Xpress SARS-CoV-2/FLU/RSV plus assay is intended as an aid in the diagnosis of influenza from  Nasopharyngeal swab specimens and should not be used as a sole basis for treatment. Nasal washings and aspirates are unacceptable for Xpert Xpress SARS-CoV-2/FLU/RSV testing.  Fact Sheet for Patients: BloggerCourse.com  Fact Sheet for Healthcare Providers: SeriousBroker.it  This test is not yet approved or cleared by the [place]  FDA and has been authorized for detection and/or diagnosis of SARS-CoV-2 by FDA under an Emergency Use Authorization (EUA). This EUA will remain in effect (meaning this test can be used) for the duration of the COVID-19 declaration under Section 564(b)(1) of the Act, 21 U.S.C. section 360bbb-3(b)(1), unless the authorization is terminated or revoked.     Resp Syncytial Virus by PCR NEGATIVE NEGATIVE Final    Comment: (NOTE) Fact Sheet for Patients: BloggerCourse.com  Fact Sheet for Healthcare Providers: SeriousBroker.it  This test is not yet approved or cleared by the [place]  FDA and has been authorized for detection and/or diagnosis of SARS-CoV-2 by FDA under an Emergency Use Authorization (EUA). This EUA will remain in effect (meaning this test can be used) for the duration of the COVID-19 declaration under Section 564(b)(1) of the Act, 21 U.S.C. section 360bbb-3(b)(1), unless the authorization is terminated or revoked.  Performed at [facility], [address] [place], [place] [address]   MRSA Next Gen by PCR, Nasal     Status: None   Collection Time: 04/10/24  3:11 PM   Specimen: Nasal Mucosa; Nasal Swab  Result Value Ref Range Status   MRSA by PCR Next Gen NOT DETECTED NOT DETECTED Final    Comment: (NOTE) The GeneXpert MRSA Assay (FDA approved for NASAL specimens only), is one component of a comprehensive MRSA colonization surveillance program. It is not intended to diagnose MRSA infection nor to guide or  monitor treatment for MRSA infections. Test performance is not FDA approved in patients less than 5 years old. Performed at [facility], [address] [place], [place] [address]   Respiratory (~20 pathogens) panel by PCR     Status: None   Collection Time: 04/10/24  6:32 PM   Specimen: Nasopharyngeal Swab; Respiratory  Result Value Ref Range Status   Adenovirus NOT DETECTED NOT DETECTED Final   Coronavirus 229E NOT DETECTED NOT DETECTED Final    Comment: (NOTE) The Coronavirus on the Respiratory Panel, DOES NOT test for the novel  Coronavirus (2019 nCoV)    Coronavirus HKU1 NOT DETECTED NOT DETECTED Final   Coronavirus NL63 NOT DETECTED NOT DETECTED Final   Coronavirus OC43 NOT DETECTED NOT DETECTED Final   Metapneumovirus NOT DETECTED NOT DETECTED Final   Rhinovirus / Enterovirus NOT DETECTED NOT DETECTED Final   Influenza A NOT DETECTED NOT DETECTED Final   Influenza B NOT DETECTED NOT DETECTED Final   Parainfluenza Virus 1 NOT DETECTED NOT DETECTED Final   Parainfluenza Virus 2 NOT DETECTED NOT DETECTED Final   Parainfluenza Virus 3 NOT DETECTED NOT DETECTED Final  Parainfluenza Virus 4 NOT DETECTED NOT DETECTED Final   Respiratory Syncytial Virus NOT DETECTED NOT DETECTED Final   Bordetella pertussis NOT DETECTED NOT DETECTED Final   Bordetella Parapertussis NOT DETECTED NOT DETECTED Final   Chlamydophila pneumoniae NOT DETECTED NOT DETECTED Final   Mycoplasma pneumoniae NOT DETECTED NOT DETECTED Final    Comment: Performed at [facility], [address], [place], [place] [address]  Expectorated Sputum Assessment w Gram Stain, Rflx to Resp Cult     Status: None   Collection Time: 04/11/24 12:59 AM   Specimen: Expectorated Sputum  Result Value Ref Range Status   Specimen Description EXPECTORATED SPUTUM  Final   Special Requests EXPECTORATED SPUTUM  Final   Sputum evaluation   Final    THIS SPECIMEN IS ACCEPTABLE FOR SPUTUM CULTURE Performed at  [facility], [address] [place], [place] [address]    Report Status 04/11/2024 FINAL  Final  Culture, Respiratory w Gram Stain     Status: None   Collection Time: 04/11/24 12:59 AM  Result Value Ref Range Status   Specimen Description   Final    EXPECTORATED SPUTUM Performed at [facility], [address], [place], [place] [address]    Special Requests   Final    EXPECTORATED SPUTUM Reflexed from [id number] Performed at [facility], [address], [place], [place] [address]    Gram Stain   Final    FEW WBC PRESENT, PREDOMINANTLY PMN FEW GRAM POSITIVE COCCI FEW GRAM NEGATIVE RODS    Culture   Final    FEW Normal respiratory flora-no Staph aureus or Pseudomonas seen Performed at [facility], [address] [place], [place] [address]    Report Status 04/13/2024 FINAL  Final         Radiology Studies: No results found.        Scheduled Meds:  arformoterol   15 mcg Nebulization Q12H   aspirin  EC  81 mg Oral QHS   budesonide  (PULMICORT ) nebulizer solution  0.5 mg Nebulization BID   Chlorhexidine  Gluconate Cloth  6 each Topical Daily   enoxaparin  (LOVENOX ) injection  40 mg Subcutaneous Q24H   feeding supplement  237 mL Oral BID BM   finasteride   5 mg Oral QHS   fluticasone   2 spray Each Nare BID   guaiFENesin   1,200 mg Oral BID   insulin  aspart  0-5 Units Subcutaneous QHS   insulin  aspart  0-9 Units Subcutaneous TID WC   insulin  aspart  4 Units Subcutaneous TID WC   insulin  glargine  20 Units Subcutaneous Daily   levothyroxine   100 mcg Oral QHS   loratadine   10 mg Oral QHS   metoCLOPramide   5 mg Oral BID AC   pantoprazole   40 mg Oral QHS   potassium chloride   40 mEq Oral Daily   revefenacin   175 mcg Nebulization Daily   simvastatin   40 mg Oral Daily   tamsulosin   0.8 mg Oral QHS   Continuous Infusions:  piperacillin -tazobactam (ZOSYN )  IV 3.375 g (04/16/24 0919)     LOS: 6 days    Time  spent: 40 minutes    Renato Applebaum, MD [facility]

## 2024-04-16 NOTE — TOC Progression Note (Signed)
 Transition of Care [facility]) - Progression Note    Patient Details  Name: MARVELOUS BOUWENS MRN: [id number] Date of Birth: Apr 23, 1956  Transition of [facility]) CM/SW Contact  Jon ONEIDA Anon, RN Phone Number: 04/16/2024, 2:16 PM  Clinical Narrative:    Pt is requiring 4L O2, not baseline. Trying to wean pt O2 to RA prior to DC. PT/OT recommending HHPT/OT at DC. NCM sent referral to Cindie with [facility], and she states [facility] can offer HHPT/OT services at time of DC. Will need HH PT/OT orders at DC. IP CM continuing to follow.     Expected Discharge Plan: Home w Home Health Services Barriers to Discharge: Continued Medical Work up, Other (must enter comment) (Pt requiring incresed flow rate of O2)               Expected Discharge Plan and Services In-house Referral: NA Discharge Planning Services: CM Consult Post Acute Care Choice: Home Health Living arrangements for the past 2 months: Single Family Home                 DME Arranged: N/A DME Agency: NA                   Social Drivers of Health (SDOH) Interventions SDOH Screenings   Food Insecurity: No Food Insecurity (04/10/2024)  Housing: Unknown (04/12/2024)  Transportation Needs: No Transportation Needs (04/10/2024)  Utilities: Not At Risk (04/10/2024)  Social Connections: Moderately Integrated (04/10/2024)  Tobacco Use: Medium Risk (04/10/2024)    Readmission Risk Interventions    04/12/2024    3:45 PM  Readmission Risk Prevention Plan  Transportation Screening Complete  PCP or Specialist Appt within 5-7 Days Complete  Home Care Screening Complete  Medication Review (RN CM) Complete

## 2024-04-16 NOTE — Plan of Care (Signed)
  Problem: Education: Goal: Ability to describe self-care measures that may prevent or decrease complications (Diabetes Survival Skills Education) will improve Outcome: Progressing   Problem: Fluid Volume: Goal: Ability to maintain a balanced intake and output will improve Outcome: Progressing   Problem: Health Behavior/Discharge Planning: Goal: Ability to identify and utilize available resources and services will improve Outcome: Progressing Goal: Ability to manage health-related needs will improve Outcome: Progressing   

## 2024-04-16 NOTE — Progress Notes (Cosign Needed Addendum)
 SATURATION QUALIFICATIONS: (This note is used to comply with regulatory documentation for home oxygen )  Patient Saturations on Room Air at Rest = 91%  Patient Saturations on Room Air while Ambulating = n/a  Patient Saturations on 3 Liters of oxygen  while Ambulating = 87%  Please briefly explain why patient needs home oxygen :
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
## 2024-04-16 NOTE — Progress Notes (Signed)
 eLink Physician-Brief Progress Note Patient Name: James [name] DOB: Oct 21, 1955 MRN: [id number]   Date of Service  04/16/2024  HPI/Events of Note  Bedside RN notifying us  that SBP has been >160 for last few checks.  Patient seen resting, not in distress HR 64  eICU Interventions  Ordered hydralazine  10 mg prn for SBP > 160     Intervention Category Intermediate Interventions: Hypertension - evaluation and management  Damien DASEN Zya Finkle 04/16/2024, 3:28 AM

## 2024-04-16 NOTE — Progress Notes (Signed)
 Physical Therapy Treatment Patient Details Name: James [name] MRN: [id number] DOB: 04/10/1956 Today's Date: 04/16/2024   History of Present Illness James [name] is a 68 y.o. male presents with SOB and cough; admitted 9/23 with hypoxic respiratory failure and pna.[name]:jwkpzub depression, asthma COPD, coronary artery disease, type 1 diabetes on insulin , diabetic retinopathy nephrolithiasis, hypothyroidism, hyperlipidemia, multiple recurrent pneumonia    PT Comments  The patient is eager to ambulate. Patient ambulated x~ 18' on 2 L with SPO2 drop to 85%. Increased to 3 L with drop to 87% briefly.  Back to 92 % after rest in room.  Patient ambulated total of 200' with no device.Continue PT.   If plan is discharge home, recommend the following: Assistance with cooking/housework;Assist for transportation;Help with stairs or ramp for entrance   Can travel by private vehicle        Equipment Recommendations  None recommended by PT    Recommendations for Other Services       Precautions / Restrictions Precautions Precaution/Restrictions Comments: monito sats, trying to wean off     Mobility  Bed Mobility Overal bed mobility: Independent                  Transfers   Equipment used: None Transfers: Sit to/from Stand Sit to Stand: Supervision                Ambulation/Gait Ambulation/Gait assistance: Contact guard assist Gait Distance (Feet): 200 Feet Assistive device: None Gait Pattern/deviations: Step-through pattern Gait velocity: decr     General Gait Details: gait steady   Stairs             Wheelchair Mobility     Tilt Bed    Modified Rankin (Stroke Patients Only)       Balance Overall balance assessment: Mild deficits observed, not formally tested                                          Communication Communication Communication: No apparent difficulties  Cognition Arousal: Alert Behavior During Therapy: WFL for tasks  assessed/performed   PT - Cognitive impairments: Orientation                                Cueing    Exercises      General Comments        Pertinent Vitals/Pain Pain Assessment Pain Assessment: Faces Faces Pain Scale: Hurts little more Pain Location: back Pain Descriptors / Indicators: Aching Pain Intervention(s): Monitored during session    Home Living                          Prior Function            PT Goals (current goals can now be found in the care plan section) Progress towards PT goals: Progressing toward goals    Frequency    Min 3X/week      PT Plan      Co-evaluation              AM-PAC PT 6 Clicks Mobility   Outcome Measure  Help needed turning from your back to your side while in a flat bed without using bedrails?: None Help needed moving from lying on your back to sitting on the side of a flat  bed without using bedrails?: None Help needed moving to and from a bed to a chair (including a wheelchair)?: A Little Help needed standing up from a chair using your arms (e.g., wheelchair or bedside chair)?: A Little Help needed to walk in hospital room?: A Little Help needed climbing 3-5 steps with a railing? : A Little 6 Click Score: 20    End of Session Equipment Utilized During Treatment: Oxygen ;Gait belt Activity Tolerance: Patient tolerated treatment well Patient left: in bed;with call bell/phone within reach Nurse Communication: Mobility status PT Visit Diagnosis: Unsteadiness on feet (R26.81);Difficulty in walking, not elsewhere classified (R26.2)     Time: 8398-8387 PT Time Calculation (min) (ACUTE ONLY): 11 min  Charges:    $Gait Training: 8-22 mins PT General Charges $$ ACUTE PT VISIT: 1 Visit                     James [name] PT Acute Rehabilitation Services Office [phone]   James [name] 04/16/2024, 4:41 PM

## 2024-04-16 NOTE — Progress Notes (Signed)
 NAME:  James [name], MRN:  [id number], DOB:  [birth year], LOS: 6 ADMISSION DATE:  04/10/2024, CONSULTATION DATE:  9/23 REFERRING MD:  Dr. Celinda, CHIEF COMPLAINT:  PNA; acute respiratory failure w/ hypoxia   History of Present Illness:  Patient is a 68 yo M w/ pertinent PMH asthma, multiple episodes of pna (seen by Dr. Jude outpt), anxiety, depression, T1DM, HLD, hypothyroidism, GERD, CKD, seizures presents to [facility] on 9/23 w/ hypoxic respiratory failure and pna.  Patient has hx of recurrent pna. Recently treated in 01/2024 w/ clinda and doxy w/ course of prednisone . States has cough in morning feels like he may be aspirating. Denies fever. Having worsening sob over the past month. On 9/23 checked pulse ox at home w/ sats in 70s. EMS called and placed on o2. Gave nebs and steroids. Transported to [facility] ED. Placed on salter [place]. Afebrile and wbc 8. Covid/flu/rsv and mrsa pcr negative. CXR w/ RLL pna. CTA chest negative for PE; RML and RLL consolidation. Given rocephin zenovia. PCCM consulted.  Pertinent  Medical History   Past Medical History:  Diagnosis Date   Anxiety    Asthma    ASTHMATIC BRONCHITIS, ACUTE 10/25/2008   Bladder neck obstruction    CARPAL TUNNEL SYNDROME, BILATERAL 07/31/2007   issues resolved, no surgery   Cervical disc disease    CORONARY ARTERY DISEASE 04/03/2007   Depression    DIABETES MELLITUS, TYPE I 04/03/2007   Diabetic retinopathy associated with diabetes mellitus due to underlying condition (HCC) 04/03/2007   DM W/EYE MANIFESTATIONS, TYPE I, UNCONTROLLED 04/04/2007   DM W/RENAL MNFST, TYPE I, UNCONTROLLED 04/04/2007   ED (erectile dysfunction)    History of kidney stones    HYPERLIPIDEMIA 04/04/2007   Hypothyroidism    Pneumonia    several times and again today (02/13/2104)   Renal insufficiency    Seizures (HCC)    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 Bumped up to 70% fio2 on 35 l/m 9/26 Remains on [facility] at 40L, ongoing issues with coughing spells but improved 9/27 weaned to [facility] from [facility]  Interim History / Subjective:  Feels better Cough remains dry Afebrile Down to 4 L nasal cannula  Objective    Blood pressure (!) 144/62, pulse 64, temperature 97.6 F (36.4 C), temperature source Oral, resp. rate (!) 23, SpO2 96%.        Intake/Output Summary (Last 24 hours) at 04/16/2024 0857 Last data filed at 04/16/2024 0400 Gross per 24 hour  Intake 371.03 ml  Output 50 ml  Net 321.03 ml   There were no vitals filed for this visit.  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 he has seen swallow therapist in the past, not compliant with food consistency recommendations  Hx of asthma -rvp legionella and strep negative -exp sputum with OP flora P: Continue O2, wean for goal O2 sat 90% Continue Zosyn  while inpatient will need 10 to 14 days of antibiotics total given slow recovery LAM LABA ICS, PRN SABA/SAMA Aggressive pulmonary hygiene -use flutter valve, Mucinex  as outpatient, saline nebs Mobilize as able  Completed course 5 days prednisone  40 mg As needed cough suppressants    Await for swallow evaluation further recommendations today Remains to be seen whether he will need oxygen  on  discharge, will arrange pulmonary office follow-up in 2 weeks PCCM will be available as needed    Kalyiah Saintil V. Jude, MD [facility] Personal contact information can be found on Amion  If no contact or response made please call [phone] 04/16/2024, 8:57 AM

## 2024-04-17 DIAGNOSIS — T17908A Unspecified foreign body in respiratory tract, part unspecified causing other injury, initial encounter: Secondary | ICD-10-CM | POA: Diagnosis not present

## 2024-04-17 DIAGNOSIS — J9601 Acute respiratory failure with hypoxia: Secondary | ICD-10-CM | POA: Diagnosis not present

## 2024-04-17 LAB — GLUCOSE, CAPILLARY
Glucose-Capillary: 158 mg/dL — ABNORMAL HIGH (ref 70–99)
Glucose-Capillary: 154 mg/dL — ABNORMAL HIGH (ref 70–99)
Glucose-Capillary: 263 mg/dL — ABNORMAL HIGH (ref 70–99)
Glucose-Capillary: 193 mg/dL — ABNORMAL HIGH (ref 70–99)
Glucose-Capillary: 219 mg/dL — ABNORMAL HIGH (ref 70–99)

## 2024-04-17 MED ORDER — METHOCARBAMOL 500 MG PO TABS
500.0000 mg | ORAL_TABLET | Freq: Four times a day (QID) | ORAL | Status: DC | PRN
  Administered 2024-04-17 – 2024-04-18 (×2): 500 mg via ORAL
  Filled 2024-04-17 (×2): qty 1

## 2024-04-17 MED ORDER — DIPHENHYDRAMINE-ZINC ACETATE 2-0.1 % EX CREA
TOPICAL_CREAM | Freq: Three times a day (TID) | CUTANEOUS | Status: DC | PRN
  Filled 2024-04-17: qty 28

## 2024-04-17 NOTE — Progress Notes (Signed)
 Mobility Specialist - Progress Note   [place] 3 L Pre-mobility: 102 BPM HR,  92% SpO2 During mobility: 120 BPM HR, 89% SpO2 Post-mobility: 95 BPM HR, 92% SpO2    04/17/24 1053  Mobility  Activity Ambulated with assistance  Level of Assistance Contact guard assist, steadying assist  Assistive Device None  Distance Ambulated (ft) 65 ft  Activity Response Tolerated well  Mobility Referral Yes  Mobility visit 1 Mobility  Mobility Specialist Start Time (ACUTE ONLY) 1020  Mobility Specialist Stop Time (ACUTE ONLY) 1035  Mobility Specialist Time Calculation (min) (ACUTE ONLY) 15 min   Pt was received in bed and agreed to mobility. Slight drowsiness during ambulation. Returned to chair with all needs met. Left with wife in room.  [employer] - Mobility Specialist

## 2024-04-17 NOTE — Plan of Care (Signed)
  Problem: Education: Goal: Ability to describe self-care measures that may prevent or decrease complications (Diabetes Survival Skills Education) will improve Outcome: Progressing   Problem: Coping: Goal: Ability to adjust to condition or change in health will improve Outcome: Progressing   Problem: Fluid Volume: Goal: Ability to maintain a balanced intake and output will improve Outcome: Progressing   Problem: Nutritional: Goal: Maintenance of adequate nutrition will improve Outcome: Progressing   Problem: Tissue Perfusion: Goal: Adequacy of tissue perfusion will improve Outcome: Progressing   Problem: Activity: Goal: Risk for activity intolerance will decrease Outcome: Progressing

## 2024-04-17 NOTE — TOC Progression Note (Addendum)
 Transition of Care [facility]) - Progression Note    Patient Details  Name: James [name] MRN: [id number] Date of Birth: 10/12/1955  [facility] CM/SW Contact  Jon ONEIDA Anon, RN Phone Number: 04/17/2024, 1:37 PM  Clinical Narrative:    Pt needing Home O2 at discharge. Orders for Home O2 sent to Jermaine with [employer] and travel tank will be delivered to pt room prior to DC. Pt has HHPT set up with [facility]. Pt will need HH orders placed at DC. IP CM continuing to follow.      Expected Discharge Plan: Home w Home Health Services Barriers to Discharge: Continued Medical Work up, Other (must enter comment) (Pt requiring incresed flow rate of O2)               Expected Discharge Plan and Services In-house Referral: NA Discharge Planning Services: CM Consult Post Acute Care Choice: Durable Medical Equipment, Home Health Living arrangements for the past 2 months: Single Family Home                 DME Arranged: Oxygen  DME Agency: [employer] Date DME Agency Contacted: 04/17/24 Time DME Agency Contacted: [id number] Representative spoke with at DME Agency: Jermaine with [employer]             Social Drivers of Health (SDOH) Interventions SDOH Screenings   Food Insecurity: No Food Insecurity (04/10/2024)  Housing: Unknown (04/12/2024)  Transportation Needs: No Transportation Needs (04/10/2024)  Utilities: Not At Risk (04/10/2024)  Social Connections: Moderately Integrated (04/10/2024)  Tobacco Use: Medium Risk (04/10/2024)    Readmission Risk Interventions    04/12/2024    3:45 PM  Readmission Risk Prevention Plan  Transportation Screening Complete  PCP or Specialist Appt within 5-7 Days Complete  Home Care Screening Complete  Medication Review (RN CM) Complete
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
## 2024-04-18 ENCOUNTER — Other Ambulatory Visit (HOSPITAL_COMMUNITY): Payer: Self-pay

## 2024-04-18 ENCOUNTER — Other Ambulatory Visit (HOSPITAL_BASED_OUTPATIENT_CLINIC_OR_DEPARTMENT_OTHER): Payer: Self-pay

## 2024-04-18 LAB — GLUCOSE, CAPILLARY: Glucose-Capillary: 228 mg/dL — ABNORMAL HIGH (ref 70–99)

## 2024-04-18 MED ORDER — METHOCARBAMOL 500 MG PO TABS
500.0000 mg | ORAL_TABLET | Freq: Four times a day (QID) | ORAL | 0 refills | Status: AC | PRN
  Filled 2024-04-18: qty 20, 5d supply, fill #0

## 2024-04-18 MED ORDER — ALBUTEROL SULFATE (2.5 MG/3ML) 0.083% IN NEBU
3.0000 mL | INHALATION_SOLUTION | Freq: Four times a day (QID) | RESPIRATORY_TRACT | 3 refills | Status: DC | PRN
  Filled 2024-04-18: qty 180, 15d supply, fill #0

## 2024-04-18 MED ORDER — PROMETHAZINE-DM 6.25-15 MG/5ML PO SYRP
5.0000 mL | ORAL_SOLUTION | Freq: Four times a day (QID) | ORAL | 0 refills | Status: DC | PRN
  Filled 2024-04-18: qty 180, 9d supply, fill #0

## 2024-04-18 MED ORDER — LEVOFLOXACIN 750 MG PO TABS
750.0000 mg | ORAL_TABLET | Freq: Every day | ORAL | 0 refills | Status: AC
Start: 2024-04-18 — End: 2024-04-24
  Filled 2024-04-18: qty 6, 6d supply, fill #0

## 2024-04-18 MED ORDER — GUAIFENESIN ER 600 MG PO TB12
1200.0000 mg | ORAL_TABLET | Freq: Two times a day (BID) | ORAL | 0 refills | Status: AC
  Filled 2024-04-18: qty 20, 5d supply, fill #0
  Filled 2024-04-25: qty 20, 5d supply, fill #1
  Filled 2024-05-01 – 2024-05-02 (×2): qty 20, 5d supply, fill #2

## 2024-04-18 NOTE — Progress Notes (Signed)
 Occupational Therapy Treatment Patient Details Name: James [name] MRN: [id number] DOB: [birth year] Today's Date: 04/18/2024   History of present illness Patient is a 68 y.o. male presents with SOB and cough; admitted 9/23 with hypoxic respiratory failure and pna. PMHx includes anxiety depression, asthma, COPD, coronary artery disease, type 1 diabetes on insulin , diabetic retinopathy nephrolithiasis, hypothyroidism, hyperlipidemia, multiple recurrent pneumonia   OT comments  Arrived to find pt in high fowlers and using urinal bottle to void bladder with Ind, including managing garments.  Pt reported mild pain in thoracolumbar region which is chronic.  Patient declined offer of participating in grooming & hygiene activities but was agreeable to focus on activities that support pulmonary function.  While patient participated in use of incentive spirometer and flutter valve, provided education regarding EC strategies, particularly modifying self-care tasks to decrease incidences of trunk flexion in setting of COPD.  Patient verbalized understanding.  Patient then transitioned to sitting EOB with Ind and no use of AD.  Guided patient in overhead stretch to promote increased rib cage volume in support of lung capacity; patient reported that stretch felt good.  Advised patient to use that stretch first thing upon awakening and before bed for optimal benefit.  Proceeded to guide pt through scapular protraction stretch as well as cervical stretches in 4 planes to increase ROM and decrease pain.  Patient return demonstrated all with good form.  Patient is being discharged from OT services, having met goals adequately to support return home, where home health OT and oxygen  services are anticipated.  OT signing off.      If plan is discharge home, recommend the following:  Assistance with cooking/housework;Help with stairs or ramp for entrance   Equipment Recommendations  Tub/shower seat       Precautions /  Restrictions Precautions Precautions: Fall (Monitor SpO2) Restrictions Weight Bearing Restrictions Per Provider Order: No       Mobility Bed Mobility Overal bed mobility: Independent Bed Mobility: Supine to Sit    Supine to sit: Independent   Patient Response: Cooperative  Transfers     Balance Overall balance assessment: Modified Independent Sitting-balance support: No upper extremity supported, Feet supported Sitting balance-Leahy Scale: Good Sitting balance - Comments: Patient maintained sitting EOB without UE support while participating in stretching exercises.     ADL either performed or assessed with clinical judgement   ADL   Eating/Feeding:   Grooming:   Upper Body Bathing:   Lower Body Bathing:  Lower Body Dressing:   Toilet Transfer: Supervision/safety;Ambulation;Contact guard assist Toileting- Clothing Manipulation and Hygiene: Bed level;Modified independent Toileting - Clothing Manipulation Details (indicate cue type and reason): Patient managed gown and urinal bottle while voiding bladder at bed level Tub/ Shower Transfer:  Functional mobility during ADLs: Contact guard assist;Rolling walker (2 wheels) General ADL Comments: Provided education regarding energy conservation strategies for ADL activities.  Patient verbalized understanding and provided adequate teach-back.  Handout provided and reviewed; patient verbalized understanding.  Patient reported no difficulties with self-care tasks encountered this date.    Extremity/Trunk Assessment Upper Extremity Assessment Upper Extremity Assessment: Defer to OT evaluation      Vision No change from baseline.  Perception   Praxis   Communication Communication Communication: No apparent difficulties   Cognition Arousal: Alert Behavior During Therapy: WFL for tasks assessed/performed Cognition: No apparent impairments   OT - Cognition Comments: Oriented x4   Following commands: Intact        Cueing    Cueing Techniques: Verbal cues  Pertinent Vitals/ Pain       Pain Assessment Pain Assessment: 0-10 Pain Score: 2  Pain Location: Chronic thoracolumbar pain Pain Descriptors / Indicators: Aching Pain Intervention(s): Monitored during session, Other (comment) (Stretching)      Progress Toward Goals  OT Goals(current goals can now be found in the care plan section)  Progress towards OT goals: Goals met/education completed, patient discharged from OT      AM-PAC OT 6 Clicks Daily Activity     Outcome Measure   Help from another person eating meals?: None Help from another person taking care of personal grooming?: A Little Help from another person toileting, which includes using toliet, bedpan, or urinal?: A Little Help from another person bathing (including washing, rinsing, drying)?: A Little Help from another person to put on and taking off regular upper body clothing?: A Little Help from another person to put on and taking off regular lower body clothing?: A Little 6 Click Score: 19    End of Session Equipment Utilized During Treatment: Oxygen   OT Visit Diagnosis: Muscle weakness (generalized) (M62.81);Pain Pain - part of body:  (Cervical and throacolumbar spine)   Activity Tolerance Patient tolerated treatment well   Patient Left with call bell/phone within reach;in bed (Seated EOB)   Nurse Communication          Time: 8851-8790 OT Time Calculation (min): 21 min  Charges: OT General Charges $OT Visit: 1 Visit OT Treatments $Therapeutic Exercise: 8-22 mins    Belvie KATHEE Bud, MS, OTR/L 04/18/2024, 3:46 PM

## 2024-04-18 NOTE — Discharge Summary (Signed)
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 F (36.7 C)  SpO2: 94% 98%   Vitals:   04/18/24 0525 04/18/24 0828 04/18/24 0835 04/18/24 0932  BP: 111/68   113/64  Pulse: 80   77  Resp: 16   16  Temp: 97.7 F (36.5 C)   98 F (36.7 C)  TempSrc: Oral   Oral  SpO2: 93% 94% 94% 98%  Weight:      Height:        General: Pt is alert, awake, not in acute distress Cardiovascular: RRR, S1/S2 +, no rubs, no gallops Respiratory: CTA bilaterally, no wheezing, no rhonchi, on 3 L oxygen . Abdominal: Soft, NT, ND, bowel sounds + Extremities: no edema, no cyanosis    The results of significant diagnostics from this hospitalization (including imaging, microbiology, ancillary and laboratory) are listed below for reference.     Microbiology: Recent Results (from the past 240 hours)  Resp panel by RT-PCR (RSV, Flu A&B, Covid) Anterior Nasal Swab     Status: None   Collection Time: 04/10/24 10:31 AM   Specimen: Anterior Nasal Swab  Result Value Ref Range Status   SARS Coronavirus 2 by RT PCR NEGATIVE NEGATIVE Final    Comment: (NOTE) SARS-CoV-2 target nucleic acids are NOT DETECTED.  The SARS-CoV-2 RNA is generally detectable in upper  respiratory specimens during the acute phase of infection. The lowest concentration of SARS-CoV-2 viral copies this assay can detect is 138 copies/mL. A negative result does not preclude SARS-Cov-2 infection and should not be used as the sole basis for treatment or other patient management decisions. A negative result may occur with  improper specimen collection/handling, submission of specimen other than nasopharyngeal swab, presence of viral mutation(s) within the areas targeted by this assay, and inadequate number of viral copies(<138 copies/mL). A negative result must be combined with clinical observations, patient history, and epidemiological information. The expected result is Negative.  Fact Sheet for Patients:  BloggerCourse.com  Fact Sheet for Healthcare Providers:  SeriousBroker.it  This test is no t yet approved or cleared by the [place]  FDA and  has been authorized for detection and/or diagnosis of SARS-CoV-2 by FDA under an Emergency Use Authorization (EUA).  This EUA will remain  in effect (meaning this test can be used) for the duration of the COVID-19 declaration under Section 564(b)(1) of the Act, 21 U.S.C.section 360bbb-3(b)(1), unless the authorization is terminated  or revoked sooner.       Influenza A by PCR NEGATIVE NEGATIVE Final   Influenza B by PCR NEGATIVE NEGATIVE Final    Comment: (NOTE) The Xpert Xpress SARS-CoV-2/FLU/RSV plus assay is intended as an aid in the diagnosis of influenza from Nasopharyngeal swab specimens and should not be used as a sole basis for treatment. Nasal washings and aspirates are unacceptable for Xpert Xpress SARS-CoV-2/FLU/RSV testing.  Fact Sheet for Patients: BloggerCourse.com  Fact Sheet for Healthcare Providers: SeriousBroker.it  This test is not yet approved or cleared by the [place]  FDA and has been  authorized for detection and/or diagnosis of SARS-CoV-2 by FDA under an Emergency Use Authorization (EUA). This EUA will remain in effect (meaning this test can be used) for the duration of the COVID-19 declaration under Section 564(b)(1) of the Act, 21 U.S.C. section 360bbb-3(b)(1), unless the authorization is terminated or revoked.     Resp Syncytial Virus by PCR NEGATIVE NEGATIVE Final    Comment: (NOTE) Fact Sheet for Patients: BloggerCourse.com  Fact Sheet for Healthcare Providers: SeriousBroker.it  This test is not yet approved or cleared by the [place]  FDA and has been authorized for detection and/or diagnosis of SARS-CoV-2 by FDA under an Emergency Use Authorization (EUA). This EUA will remain in effect (meaning this test can be used) for the duration of the COVID-19 declaration under Section 564(b)(1) of the Act, 21 U.S.C. section 360bbb-3(b)(1), unless the authorization is terminated or revoked.  Performed at [facility], [address] [place], [place] [address]   MRSA Next Gen by PCR, Nasal     Status: None   Collection Time: 04/10/24  3:11 PM   Specimen: Nasal Mucosa; Nasal Swab  Result Value Ref Range Status   MRSA by PCR Next Gen NOT DETECTED NOT DETECTED Final    Comment: (NOTE) The GeneXpert MRSA Assay (FDA approved for NASAL specimens only), is one component of a comprehensive MRSA colonization surveillance program. It is not intended to diagnose MRSA infection nor to guide or monitor treatment for MRSA infections. Test performance is not FDA approved in patients less than 64 years old. Performed at [facility], [address] [place], [place] [address]   Respiratory (~20 pathogens) panel by PCR     Status: None   Collection Time: 04/10/24  6:32 PM   Specimen: Nasopharyngeal Swab; Respiratory  Result Value Ref Range Status   Adenovirus NOT DETECTED NOT  DETECTED Final   Coronavirus 229E NOT DETECTED NOT DETECTED Final    Comment: (NOTE) The Coronavirus on the Respiratory Panel, DOES NOT test for the novel  Coronavirus (2019 nCoV)    Coronavirus HKU1 NOT DETECTED NOT DETECTED Final   Coronavirus NL63 NOT DETECTED NOT DETECTED Final   Coronavirus OC43 NOT DETECTED NOT DETECTED Final   Metapneumovirus NOT DETECTED NOT DETECTED Final   Rhinovirus / Enterovirus NOT DETECTED NOT DETECTED Final   Influenza A NOT DETECTED NOT DETECTED Final   Influenza B NOT DETECTED NOT DETECTED Final   Parainfluenza Virus 1 NOT DETECTED NOT DETECTED Final   Parainfluenza Virus 2 NOT DETECTED NOT DETECTED Final   Parainfluenza Virus 3 NOT DETECTED NOT DETECTED Final   Parainfluenza Virus 4 NOT DETECTED NOT DETECTED Final   Respiratory Syncytial Virus NOT DETECTED NOT  DETECTED Final   Bordetella pertussis NOT DETECTED NOT DETECTED Final   Bordetella Parapertussis NOT DETECTED NOT DETECTED Final   Chlamydophila pneumoniae NOT DETECTED NOT DETECTED Final   Mycoplasma pneumoniae NOT DETECTED NOT DETECTED Final    Comment: Performed at [facility], [address] [place], [place] [address]  Expectorated Sputum Assessment w Gram Stain, Rflx to Resp Cult     Status: None   Collection Time: 04/11/24 12:59 AM   Specimen: Expectorated Sputum  Result Value Ref Range Status   Specimen Description EXPECTORATED SPUTUM  Final   Special Requests EXPECTORATED SPUTUM  Final   Sputum evaluation   Final    THIS SPECIMEN IS ACCEPTABLE FOR SPUTUM CULTURE Performed at [facility], [address] [place], [place] [address]    Report Status 04/11/2024 FINAL  Final  Culture, Respiratory w Gram Stain     Status: None   Collection Time: 04/11/24 12:59 AM  Result Value Ref Range Status   Specimen Description   Final    EXPECTORATED SPUTUM Performed at [facility], [address], [place], [place] [address]    Special Requests    Final    EXPECTORATED SPUTUM Reflexed from [id number] Performed at [facility], [address], [place], [place] [address]    Gram Stain   Final    FEW WBC PRESENT, PREDOMINANTLY PMN FEW GRAM POSITIVE COCCI FEW GRAM NEGATIVE RODS    Culture   Final    FEW Normal respiratory flora-no Staph aureus or Pseudomonas seen Performed at [facility], [address], [place], [place] [address]    Report Status 04/13/2024 FINAL  Final     Labs: BNP (last 3 results) No results for input(s): BNP in the last 8760 hours. Basic Metabolic Panel: Recent Labs  Lab 04/12/24 0320 04/14/24 0247  NA 140 136  K 3.3* 4.3  CL 104 99  CO2 22 27  GLUCOSE 102* 108*  BUN 17 19  CREATININE 0.77 0.86  CALCIUM 8.8* 9.1  MG 2.2 2.3  PHOS 4.2  --    Liver Function Tests: Recent Labs  Lab 04/12/24 0320 04/14/24 0247  AST 21 <10*  ALT 9 9  ALKPHOS 92 97  BILITOT 0.3 0.4  PROT 6.2* 6.5  ALBUMIN 3.2* 3.3*   No results for input(s): LIPASE, AMYLASE in the last 168 hours. No results for input(s): AMMONIA in the last 168 hours. CBC: Recent Labs  Lab 04/12/24 0320 04/14/24 0247  WBC 12.3* 13.3*  NEUTROABS 9.9* 10.7*  HGB 11.9* 12.9*  HCT 34.9* 38.3*  MCV 91.1 91.4  PLT 451* 414*   Cardiac Enzymes: No results for input(s): CKTOTAL, CKMB, CKMBINDEX, TROPONINI in the last 168 hours. BNP: Invalid input(s): POCBNP CBG: Recent Labs  Lab 04/17/24 0934 04/17/24 1137 04/17/24 1703 04/17/24 2043 04/18/24 0721  GLUCAP 154* 263* 193* 219* 228*   D-Dimer No results for input(s): DDIMER in the last 72 hours. Hgb A1c No results for input(s): HGBA1C in the last 72 hours. Lipid Profile No results for input(s): CHOL, HDL, LDLCALC, TRIG, CHOLHDL, LDLDIRECT in the last 72 hours. Thyroid  function studies No results for input(s): TSH, T4TOTAL, T3FREE, THYROIDAB in the last 72 hours.  Invalid input(s): FREET3 Anemia work up No  results for input(s): VITAMINB12, FOLATE, FERRITIN, TIBC, IRON, RETICCTPCT in the last 72 hours. Urinalysis    Component Value Date/Time   COLORURINE YELLOW 04/30/2023 0633   APPEARANCEUR CLEAR 04/30/2023 9366  LABSPEC 1.025 04/30/2023 0633   PHURINE 6.0 04/30/2023 0633   GLUCOSEU >=500 (A) 04/30/2023 0633   GLUCOSEU 500 (?) 05/08/2010 1434   HGBUR NEGATIVE 04/30/2023 0633   BILIRUBINUR NEGATIVE 04/30/2023 0633   KETONESUR 20 (A) 04/30/2023 0633   PROTEINUR NEGATIVE 04/30/2023 0633   UROBILINOGEN 0.2 04/11/2013 0008   NITRITE NEGATIVE 04/30/2023 0633   LEUKOCYTESUR NEGATIVE 04/30/2023 0633   Sepsis Labs Recent Labs  Lab 04/12/24 0320 04/14/24 0247  WBC 12.3* 13.3*   Microbiology Recent Results (from the past 240 hours)  Resp panel by RT-PCR (RSV, Flu A&B, Covid) Anterior Nasal Swab     Status: None   Collection Time: 04/10/24 10:31 AM   Specimen: Anterior Nasal Swab  Result Value Ref Range Status   SARS Coronavirus 2 by RT PCR NEGATIVE NEGATIVE Final    Comment: (NOTE) SARS-CoV-2 target nucleic acids are NOT DETECTED.  The SARS-CoV-2 RNA is generally detectable in upper respiratory specimens during the acute phase of infection. The lowest concentration of SARS-CoV-2 viral copies this assay can detect is 138 copies/mL. A negative result does not preclude SARS-Cov-2 infection and should not be used as the sole basis for treatment or other patient management decisions. A negative result may occur with  improper specimen collection/handling, submission of specimen other than nasopharyngeal swab, presence of viral mutation(s) within the areas targeted by this assay, and inadequate number of viral copies(<138 copies/mL). A negative result must be combined with clinical observations, patient history, and epidemiological information. The expected result is Negative.  Fact Sheet for Patients:  BloggerCourse.com  Fact Sheet for Healthcare  Providers:  SeriousBroker.it  This test is no t yet approved or cleared by the [place]  FDA and  has been authorized for detection and/or diagnosis of SARS-CoV-2 by FDA under an Emergency Use Authorization (EUA). This EUA will remain  in effect (meaning this test can be used) for the duration of the COVID-19 declaration under Section 564(b)(1) of the Act, 21 U.S.C.section 360bbb-3(b)(1), unless the authorization is terminated  or revoked sooner.       Influenza A by PCR NEGATIVE NEGATIVE Final   Influenza B by PCR NEGATIVE NEGATIVE Final    Comment: (NOTE) The Xpert Xpress SARS-CoV-2/FLU/RSV plus assay is intended as an aid in the diagnosis of influenza from Nasopharyngeal swab specimens and should not be used as a sole basis for treatment. Nasal washings and aspirates are unacceptable for Xpert Xpress SARS-CoV-2/FLU/RSV testing.  Fact Sheet for Patients: BloggerCourse.com  Fact Sheet for Healthcare Providers: SeriousBroker.it  This test is not yet approved or cleared by the [place]  FDA and has been authorized for detection and/or diagnosis of SARS-CoV-2 by FDA under an Emergency Use Authorization (EUA). This EUA will remain in effect (meaning this test can be used) for the duration of the COVID-19 declaration under Section 564(b)(1) of the Act, 21 U.S.C. section 360bbb-3(b)(1), unless the authorization is terminated or revoked.     Resp Syncytial Virus by PCR NEGATIVE NEGATIVE Final    Comment: (NOTE) Fact Sheet for Patients: BloggerCourse.com  Fact Sheet for Healthcare Providers: SeriousBroker.it  This test is not yet approved or cleared by the [place]  FDA and has been authorized for detection and/or diagnosis of SARS-CoV-2 by FDA under an Emergency Use Authorization (EUA). This EUA will remain in effect (meaning this test can be  used) for the duration of the COVID-19 declaration under Section 564(b)(1) of the Act, 21 U.S.C. section 360bbb-3(b)(1), unless the authorization is terminated or revoked.  Performed  at [facility], [address] [place], [place] [address]   MRSA Next Gen by PCR, Nasal     Status: None   Collection Time: 04/10/24  3:11 PM   Specimen: Nasal Mucosa; Nasal Swab  Result Value Ref Range Status   MRSA by PCR Next Gen NOT DETECTED NOT DETECTED Final    Comment: (NOTE) The GeneXpert MRSA Assay (FDA approved for NASAL specimens only), is one component of a comprehensive MRSA colonization surveillance program. It is not intended to diagnose MRSA infection nor to guide or monitor treatment for MRSA infections. Test performance is not FDA approved in patients less than 55 years old. Performed at [facility], [address] [place], [place] [address]   Respiratory (~20 pathogens) panel by PCR     Status: None   Collection Time: 04/10/24  6:32 PM   Specimen: Nasopharyngeal Swab; Respiratory  Result Value Ref Range Status   Adenovirus NOT DETECTED NOT DETECTED Final   Coronavirus 229E NOT DETECTED NOT DETECTED Final    Comment: (NOTE) The Coronavirus on the Respiratory Panel, DOES NOT test for the novel  Coronavirus (2019 nCoV)    Coronavirus HKU1 NOT DETECTED NOT DETECTED Final   Coronavirus NL63 NOT DETECTED NOT DETECTED Final   Coronavirus OC43 NOT DETECTED NOT DETECTED Final   Metapneumovirus NOT DETECTED NOT DETECTED Final   Rhinovirus / Enterovirus NOT DETECTED NOT DETECTED Final   Influenza A NOT DETECTED NOT DETECTED Final   Influenza B NOT DETECTED NOT DETECTED Final   Parainfluenza Virus 1 NOT DETECTED NOT DETECTED Final   Parainfluenza Virus 2 NOT DETECTED NOT DETECTED Final   Parainfluenza Virus 3 NOT DETECTED NOT DETECTED Final   Parainfluenza Virus 4 NOT DETECTED NOT DETECTED Final   Respiratory Syncytial Virus NOT DETECTED NOT  DETECTED Final   Bordetella pertussis NOT DETECTED NOT DETECTED Final   Bordetella Parapertussis NOT DETECTED NOT DETECTED Final   Chlamydophila pneumoniae NOT DETECTED NOT DETECTED Final   Mycoplasma pneumoniae NOT DETECTED NOT DETECTED Final    Comment: Performed at [facility], [address], [place], [place] [address]  Expectorated Sputum Assessment w Gram Stain, Rflx to Resp Cult     Status: None   Collection Time: 04/11/24 12:59 AM   Specimen: Expectorated Sputum  Result Value Ref Range Status   Specimen Description EXPECTORATED SPUTUM  Final   Special Requests EXPECTORATED SPUTUM  Final   Sputum evaluation   Final    THIS SPECIMEN IS ACCEPTABLE FOR SPUTUM CULTURE Performed at [facility], [address] [place], [place] [address]    Report Status 04/11/2024 FINAL  Final  Culture, Respiratory w Gram Stain     Status: None   Collection Time: 04/11/24 12:59 AM  Result Value Ref Range Status   Specimen Description   Final    EXPECTORATED SPUTUM Performed at [facility], [address] [place], [place] [address]    Special Requests   Final    EXPECTORATED SPUTUM Reflexed from [id number] Performed at [facility], [address], [place], [place] [address]    Gram Stain   Final    FEW WBC PRESENT, PREDOMINANTLY PMN FEW GRAM POSITIVE COCCI FEW GRAM NEGATIVE RODS    Culture   Final    FEW Normal respiratory flora-no Staph aureus or Pseudomonas seen Performed at [facility], [address] [place], [place] [address]    Report Status 04/13/2024 FINAL  Final     Time coordinating discharge: 45 minutes  SIGNED:   Renato Applebaum, MD  [facility] 04/18/2024, 12:27 PM

## 2024-04-18 NOTE — Progress Notes (Signed)
 Discharge medications delivered to patient at bedside in a secure bag.

## 2024-04-19 ENCOUNTER — Other Ambulatory Visit (HOSPITAL_COMMUNITY): Payer: Self-pay

## 2024-04-24 ENCOUNTER — Other Ambulatory Visit (HOSPITAL_COMMUNITY): Payer: Self-pay

## 2024-04-24 MED ORDER — IPRATROPIUM-ALBUTEROL 0.5-2.5 (3) MG/3ML IN SOLN
3.0000 mL | Freq: Four times a day (QID) | RESPIRATORY_TRACT | 1 refills | Status: DC
  Filled 2024-04-24 – 2024-05-02 (×6): qty 270, 23d supply, fill #0

## 2024-04-24 MED ORDER — BUDESONIDE-FORMOTEROL FUMARATE 160-4.5 MCG/ACT IN AERO
2.0000 | INHALATION_SPRAY | Freq: Two times a day (BID) | RESPIRATORY_TRACT | 3 refills | Status: DC
  Filled 2024-04-24 – 2024-05-02 (×3): qty 10.2, 30d supply, fill #0

## 2024-04-25 ENCOUNTER — Other Ambulatory Visit (HOSPITAL_BASED_OUTPATIENT_CLINIC_OR_DEPARTMENT_OTHER): Payer: Self-pay

## 2024-04-25 ENCOUNTER — Other Ambulatory Visit (HOSPITAL_COMMUNITY): Payer: Self-pay

## 2024-04-25 MED ORDER — IPRATROPIUM-ALBUTEROL 0.5-2.5 (3) MG/3ML IN SOLN
3.0000 mL | Freq: Four times a day (QID) | RESPIRATORY_TRACT | 1 refills | Status: DC
  Filled 2024-04-25: qty 270, 23d supply, fill #0
  Filled 2024-04-25: qty 90, 8d supply, fill #0
  Filled 2024-04-26 – 2024-07-24 (×6): qty 90, 8d supply, fill #1

## 2024-04-26 ENCOUNTER — Other Ambulatory Visit (HOSPITAL_COMMUNITY): Payer: Self-pay

## 2024-04-27 ENCOUNTER — Other Ambulatory Visit (HOSPITAL_COMMUNITY): Payer: Self-pay

## 2024-04-27 ENCOUNTER — Ambulatory Visit (HOSPITAL_BASED_OUTPATIENT_CLINIC_OR_DEPARTMENT_OTHER): Payer: Self-pay

## 2024-04-27 NOTE — Telephone Encounter (Signed)
 FYI Only or Action Required?: Action required by provider: clinical question for provider and update on patient condition.  Patient is followed in Pulmonology for Asthma, Bronchitis, last seen on 04/03/2024 by Malachy Comer GAILS, NP.  Called Nurse Triage reporting Cough.  Symptoms began a week ago.  Interventions attempted: Increased fluids/rest.  Symptoms are: unchanged.  Triage Disposition: See PCP When Office is Open (Within 3 Days)  Patient/caregiver understands and will follow disposition?: Unsure   Copied from CRM [id number]. Topic: Clinical - Red Word Triage >> Apr 27, 2024  3:14 PM Whitney O wrote: [facility] that prompted transfer to Nurse Triage: pneumonia short of breath cough and bad wheezing [facility] nurse was alarmed and told me to call my pcp and my pcp told me to call my pulmonary doctor . Needing some prednisone  called in before the weekend and he see dr jude in [place]  Reason for Disposition  Cough has been present for > 3 weeks  Answer Assessment - Initial Assessment Questions Patient is requesting a prednisone  taper to be sent in.   1. ONSET: When did the cough begin?      A week ago  2. SEVERITY: How bad is the cough today?      Intermittent  3. SPUTUM: Describe the color of your sputum (e.g., none, dry cough; clear, white, yellow, green)     No color to the sputum.  4. HEMOPTYSIS: Are you coughing up any blood? If Yes, ask: How much? (e.g., flecks, streaks, tablespoons, etc.)     No  5. DIFFICULTY BREATHING: Are you having difficulty breathing? If Yes, ask: How bad is it? (e.g., mild, moderate, severe)      Mild to Moderate  6. FEVER: Do you have a fever? If Yes, ask: What is your temperature, how was it measured, and when did it start?     No  7. CARDIAC HISTORY: Do you have any history of heart disease? (e.g., heart attack, congestive heart failure)      CAD, PVD  8. LUNG HISTORY: Do you have any history of lung disease?   (e.g., pulmonary embolus, asthma, emphysema)     Asthma  9. PE RISK FACTORS: Do you have a history of blood clots? (or: recent major surgery, recent prolonged travel, bedridden)     No  10. OTHER SYMPTOMS: Do you have any other symptoms? (e.g., runny nose, wheezing, chest pain)       Wheezing, recently discharged from the hospital  12. TRAVEL: Have you traveled out of the country in the last month? (e.g., travel history, exposures)       Recently discharged from the hospital.  Protocols used: Cough - Acute Productive-A-AH

## 2024-04-28 ENCOUNTER — Other Ambulatory Visit (HOSPITAL_COMMUNITY): Payer: Self-pay

## 2024-04-30 ENCOUNTER — Other Ambulatory Visit (HOSPITAL_COMMUNITY): Payer: Self-pay

## 2024-04-30 NOTE — Telephone Encounter (Signed)
 Please advise on Prednisone , We did receive this message after we had left on Friday

## 2024-05-01 ENCOUNTER — Other Ambulatory Visit (HOSPITAL_COMMUNITY): Payer: Self-pay

## 2024-05-01 ENCOUNTER — Other Ambulatory Visit: Payer: Self-pay

## 2024-05-02 ENCOUNTER — Other Ambulatory Visit (HOSPITAL_COMMUNITY): Payer: Self-pay

## 2024-05-02 MED ORDER — HYDROCODONE-ACETAMINOPHEN 5-325 MG PO TABS
1.0000 | ORAL_TABLET | Freq: Three times a day (TID) | ORAL | 0 refills | Status: DC | PRN
  Filled 2024-05-02: qty 60, 20d supply, fill #0

## 2024-05-02 MED ORDER — PREDNISONE 10 MG PO TABS
ORAL_TABLET | ORAL | 0 refills | Status: DC
  Filled 2024-05-02: qty 40, 16d supply, fill #0

## 2024-05-02 NOTE — Telephone Encounter (Signed)
Rx sent pt notified 

## 2024-05-03 ENCOUNTER — Other Ambulatory Visit (HOSPITAL_COMMUNITY): Payer: Self-pay

## 2024-05-03 ENCOUNTER — Encounter (HOSPITAL_BASED_OUTPATIENT_CLINIC_OR_DEPARTMENT_OTHER): Payer: Self-pay | Admitting: Pulmonary Disease

## 2024-05-03 ENCOUNTER — Ambulatory Visit (HOSPITAL_BASED_OUTPATIENT_CLINIC_OR_DEPARTMENT_OTHER)

## 2024-05-03 ENCOUNTER — Ambulatory Visit (HOSPITAL_BASED_OUTPATIENT_CLINIC_OR_DEPARTMENT_OTHER): Admitting: Pulmonary Disease

## 2024-05-03 VITALS — BP 139/86 | HR 106 | Ht 69.0 in | Wt 161.3 lb

## 2024-05-03 DIAGNOSIS — J189 Pneumonia, unspecified organism: Secondary | ICD-10-CM

## 2024-05-03 DIAGNOSIS — J9611 Chronic respiratory failure with hypoxia: Secondary | ICD-10-CM

## 2024-05-03 DIAGNOSIS — J479 Bronchiectasis, uncomplicated: Secondary | ICD-10-CM

## 2024-05-03 DIAGNOSIS — J45998 Other asthma: Secondary | ICD-10-CM

## 2024-05-03 DIAGNOSIS — J45901 Unspecified asthma with (acute) exacerbation: Secondary | ICD-10-CM

## 2024-05-03 DIAGNOSIS — J9601 Acute respiratory failure with hypoxia: Secondary | ICD-10-CM

## 2024-05-03 DIAGNOSIS — J4489 Other specified chronic obstructive pulmonary disease: Secondary | ICD-10-CM | POA: Diagnosis not present

## 2024-05-03 DIAGNOSIS — J4541 Moderate persistent asthma with (acute) exacerbation: Secondary | ICD-10-CM

## 2024-05-03 MED ORDER — FLUTICASONE-SALMETEROL 250-50 MCG/ACT IN AEPB
1.0000 | INHALATION_SPRAY | Freq: Two times a day (BID) | RESPIRATORY_TRACT | 11 refills | Status: AC
  Filled 2024-05-03: qty 60, 30d supply, fill #0
  Filled 2024-05-31 – 2024-07-04 (×4): qty 60, 30d supply, fill #1

## 2024-05-03 MED ORDER — METHYLPREDNISOLONE ACETATE 80 MG/ML IJ SUSP
80.0000 mg | Freq: Once | INTRAMUSCULAR | Status: AC
  Administered 2024-05-03: 80 mg via INTRAMUSCULAR

## 2024-05-03 NOTE — Addendum Note (Signed)
 Addended by: TRUDY WARREN CROME on: 05/03/2024 12:03 PM   Modules accepted: Orders

## 2024-05-03 NOTE — Patient Instructions (Signed)
  VISIT SUMMARY: During your visit today, we discussed your worsening respiratory symptoms, including wheezing, coughing, and difficulty breathing. We reviewed your recent treatments and medications, and made adjustments to help manage your conditions more effectively.  YOUR PLAN: -ASTHMA WITH ACUTE EXACERBATION: Asthma is a condition where your airways become inflamed and narrow, making it hard to breathe. You are experiencing an acute exacerbation, which means your symptoms have suddenly worsened. We will give you a Medrol  injection today and prescribe prednisone  to help reduce inflammation. You should start using Advair  or Wixela as your maintenance inhaler and use Duoneb every 6 hours until your symptoms improve. We will also do a chest x-ray to make sure your pneumonia is getting better. Please schedule a follow-up appointment within a week.  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 Bronchiectasis is a condition where the airways in your lungs are damaged and widened, leading to mucus build-up. Chronic bronchitis is long-term inflammation of the airways. We will switch your maintenance inhaler to Advair  or Wixela due to cost issues with your current medications. Use Duoneb as needed to control your symptoms.  -RECENT SEVERE PNEUMONIA, NOW IMPROVING: Pneumonia is an infection that inflames the air sacs in your lungs. You had a severe case that required hospitalization, but you are now improving. We will do a chest x-ray to confirm that your pneumonia is getting better.  INSTRUCTIONS: Please schedule a follow-up appointment within a week. We will also need to do a chest x-ray to confirm the improvement of your pneumonia.                      Contains text generated by Abridge.                                 Contains text generated by  Abridge.

## 2024-05-03 NOTE — Progress Notes (Signed)
 Subjective:    Patient ID: James [name], male    DOB: [birth year], 68 y.o.   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 CTA negative for PE    Meds-unable to get Dulera  due to insurance issues, on Advair , Symbicort  was  expensive  Pred tapers 07/2023, 01/2024  03/2024 hosp w/ hypoxic resp failure 2/2 to RML/RLL pna , dc'd on O2  Discussed the use of AI scribe software for clinical note transcription with the patient, who gave verbal consent to proceed.  History of Present Illness James [name] is a 68 year old male with chronic bronchitis, bronchiectasis, and recurrent aspiration pneumonia who presents with worsening respiratory symptoms. He is accompanied by his wife.  He experiences persistent wheezing, coughing, and dyspnea, particularly with exertion. Oxygen  saturation levels drop to 88% with exertion despite being on 3 liters of oxygen . He describes episodes of dyspnea and significant coughing with clear, opaque sputum.  He received a steroid injection last week, providing temporary relief, and uses an inhaler with some benefit. He completed a six-Feigel course of Levaquin  post-hospital discharge. He is currently using albuterol  inhalant and has been prescribed prednisone ,  which he has not yet started. He is not using Trelegy due to cost and is considering alternative medications like Advair  or Wixela.  He reports a lack of appetite and difficulty breathing, stating 'I can't breathe, I'm not getting any air,' despite oxygen  saturation readings of 93%. No recent aspiration episodes have occurred.    pneumonia short of breath cough and bad wheezing [facility] nurse was alarmed and told me to call  pcp and  pcp t call my pulmonary doctor    Significant tests/ events reviewed   LDCT 06/2023 >> RADS 2 LDCT 06/2020 >> 3 mm nodules   01/2023 Swallow eval >> pharyngeal dysphagia due to incomplete epiglottic inversion over the laryngeal vestibule as well as impaired timing of laryngeal vestibule closure ,Presence of ADCF hardware may contribute .A mendelsohn maneuver - in which pt elevates larynx and maintains it in that position to lengthen duration of LVC - led to decreased quantity of aspiration   LDCT chest 06/2022 RADS 2, 5 mm nodules   HRCT chest 11/2019 bilateral lower lobe bronchiectasis, no ILD, New airspace consolidation in the left lower lobe, Tracheobronchomalacia.     Ct chest 01/2014 - left volume loss and consolidation left lower lobe-rib fractures   Esophagram 10/31/19 >> Frank tracheal aspiration of thick barium during the pharyngeal phase of swallowing, causing cough response ,Widely patent distal esophageal mucosal ring   Spirometry 2012 >> FEV1 1.94 (52%) PFTs 05/2015 ratio 74, FEV1 72%, no BD response, mild restriction   PFTs 09/2018 -ratio 75, FEV1 70%, FVC 70%, no bronchodilator response  Review of Systems  neg for any significant sore throat, dysphagia, itching,  sneezing, nasal congestion or excess/ purulent secretions, fever, chills, sweats, unintended wt loss, pleuritic or exertional cp, hempoptysis, orthopnea pnd or change in chronic leg swelling. Also denies presyncope, palpitations, heartburn, abdominal pain, nausea, vomiting, diarrhea or  change in bowel or urinary habits, dysuria,hematuria, rash, arthralgias, visual complaints, headache, numbness weakness or ataxia.      Objective:   Physical Exam  Gen. Pleasant, well-nourished, in no distress, normal affect ENT - no pallor,icterus, no post nasal drip Neck: No JVD, no thyromegaly, no carotid bruits Lungs: no use of accessory muscles, no dullness to percussion, BL diffuserhonchi  Cardiovascular: Rhythm regular, heart sounds  normal, no murmurs or gallops, no peripheral edema Abdomen: soft and non-tender, no hepatosplenomegaly, BS normal. Musculoskeletal: No deformities, no cyanosis or clubbing Neuro:  alert, non focal       Assessment & Plan:   Assessment and Plan Assessment & Plan Asthma with acute exacerbation Acute exacerbation characterized by wheezing, bronchospasm, dyspnea on exertion, and episodes of breathlessness. Recent improvement with inhaler use. Differential includes anxiety contributing to symptoms. Plan to confirm improvement of pneumonia to rule out its contribution to symptoms. - Administer Medrol  120 mg IM injection - Prescribe prednisone  40 mg daily with tapering schedule - Order chest x-ray to confirm pneumonia improvement - Use Advair  or Wixela as maintenance inhaler - Use Duoneb every 6 hours until symptoms improve, then reduce frequency - Schedule follow-up appointment within a week  Chronic obstructive pulmonary disease with bronchiectasis and chronic bronchitis Chronic obstructive pulmonary disease with bronchiectasis and chronic bronchitis. Symptoms include chronic cough with clear sputum and wheezing. Recent exacerbation likely due to asthma exacerbation. Maintenance therapy adjustment needed due to cost issues with current medications. - Switch maintenance inhaler to Advair  or Wixela due to cost issues with Symbicort  and Trelegy - Use Duoneb as needed for symptom control  Recent severe pneumonia, now improving Recent severe pneumonia  requiring hospitalization and high flow oxygen . Currently improving with treatment, but recent exacerbation of asthma symptoms. Plan to confirm improvement with chest x-ray. - Order chest x-ray to confirm improvement of pneumonia Acute resp failure with hypoxia - continue 3L O2 for now, reassess once exac resolved

## 2024-05-04 ENCOUNTER — Other Ambulatory Visit (HOSPITAL_COMMUNITY): Payer: Self-pay

## 2024-05-07 ENCOUNTER — Other Ambulatory Visit: Payer: Self-pay | Admitting: Nurse Practitioner

## 2024-05-07 ENCOUNTER — Other Ambulatory Visit (HOSPITAL_COMMUNITY): Payer: Self-pay

## 2024-05-07 DIAGNOSIS — J069 Acute upper respiratory infection, unspecified: Secondary | ICD-10-CM

## 2024-05-08 ENCOUNTER — Ambulatory Visit (INDEPENDENT_AMBULATORY_CARE_PROVIDER_SITE_OTHER)

## 2024-05-08 ENCOUNTER — Other Ambulatory Visit (HOSPITAL_COMMUNITY): Payer: Self-pay

## 2024-05-08 ENCOUNTER — Encounter (HOSPITAL_BASED_OUTPATIENT_CLINIC_OR_DEPARTMENT_OTHER): Payer: Self-pay

## 2024-05-08 VITALS — BP 137/74 | HR 90 | Ht 69.0 in | Wt 166.0 lb

## 2024-05-08 DIAGNOSIS — J069 Acute upper respiratory infection, unspecified: Secondary | ICD-10-CM | POA: Diagnosis not present

## 2024-05-08 DIAGNOSIS — J4489 Other specified chronic obstructive pulmonary disease: Secondary | ICD-10-CM | POA: Diagnosis not present

## 2024-05-08 DIAGNOSIS — J9611 Chronic respiratory failure with hypoxia: Secondary | ICD-10-CM | POA: Diagnosis not present

## 2024-05-08 DIAGNOSIS — J189 Pneumonia, unspecified organism: Secondary | ICD-10-CM

## 2024-05-08 MED ORDER — PROMETHAZINE-DM 6.25-15 MG/5ML PO SYRP
5.0000 mL | ORAL_SOLUTION | Freq: Four times a day (QID) | ORAL | 0 refills | Status: DC | PRN
  Filled 2024-05-08: qty 180, 9d supply, fill #0

## 2024-05-08 NOTE — Assessment & Plan Note (Signed)
-    Slowly improving on steroid taper -  Resume Advair  -  change Duonebs from Q6h  to twice daily -  use albuterol  every 4-6h as needed -  Continue incentive spirometry -  Will need PFTs to further quantify lung function and follow disease process; active order on chart

## 2024-05-08 NOTE — Patient Instructions (Addendum)
 Continue Advair  twice daily.  Continue Albuterol  every 4-6 hours as needed for shortness of breath.  Change Duonebs to twice daily; may stop these in favor of using Albuterol .  Follow up with Dr. Jude with PFTs in 2-3 months.

## 2024-05-08 NOTE — Assessment & Plan Note (Signed)
-    Near complete resolution on Chest XR

## 2024-05-08 NOTE — Progress Notes (Signed)
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 Electronically Signed   By: Toribio Agreste M.D.   On: 04/11/2024 08:54   CT Angio Chest PE W/Cm &/Or Wo Cm Result Date: 04/10/2024 CLINICAL DATA:  Pulmonary embolism (PE) suspected, high prob reports of SHOB and cough. Denies fevers. Pts RA sat 80%. Pt placed on 15L high flow. EXAM: CT ANGIOGRAPHY CHEST WITH CONTRAST TECHNIQUE: Multidetector CT imaging of the chest was performed using the standard protocol during bolus administration of intravenous contrast. Multiplanar CT image reconstructions and MIPs were obtained to evaluate the vascular anatomy. RADIATION DOSE REDUCTION: This exam was performed according to the departmental dose-optimization program which includes automated exposure control, adjustment of the mA and/or kV according to patient size and/or use of iterative reconstruction technique. CONTRAST:  75mL OMNIPAQUE  IOHEXOL  350 MG/ML SOLN COMPARISON:  CHEST XR, 04/10/2024. CT CHEST, 07/18/2023. CTA PE, 06/04/2022. FINDINGS: Suboptimal evaluation, secondary to motion degradation. Cardiovascular: Satisfactory opacification of the pulmonary arteries to the segmental level. No segmental or larger pulmonary embolus. Normal heart size. No pericardial effusion. Severe burden of calcified multivessel coronary atherosclerosis, greatest  within the LAD. Mediastinum/Nodes: No enlarged mediastinal, hilar, or axillary lymph nodes. Thyroid  gland, trachea, and esophagus demonstrate no significant findings. Lungs/Pleura: Bronchovascular pulmonary opacities involving the middle and RIGHT lower lobes. The LEFT lung is clear. No pleural effusion or pneumothorax. Upper Abdomen: No acute abnormality.  Musculoskeletal: No acute chest wall abnormality. No acute osseous findings. Review of the MIP images confirms the above findings. IMPRESSION: 1. No segmental or larger pulmonary embolus. 2. Middle and RIGHT lower lobe pulmonary consolidation, consistent with pneumonia. Electronically Signed   By: Thom Hall M.D.   On: 04/10/2024 12:49   DG Chest Port 1 View Result Date: 04/10/2024 CLINICAL DATA:  Shortness of breath. EXAM: PORTABLE CHEST 1 VIEW COMPARISON:  02/13/2024 FINDINGS: The cardiac silhouette, mediastinal and hilar contours are within normal limits and stable. Significant airspace opacity in the right lower lobe consistent with pneumonia. There is an associated small parapneumonic effusion. The left lung is clear. The bony structures are intact. IMPRESSION: Right lower lobe pneumonia with small parapneumonic effusion. Electronically Signed   By: MYRTIS Stammer M.D.   On: 04/10/2024 11:10    methylPREDNISolone  acetate (DEPO-MEDROL ) injection 80 mg     Date Action Dose Route User   05/03/2024 1202 Given 80 mg Intramuscular (Right Quadriceps) Trudy Warren CROME, CMA          Latest Ref Rng & Units 09/18/2018   10:21 AM 05/22/2015   10:01 AM  PFT Results  FVC-Pre L 3.17  3.41   FVC-Predicted Pre % 70  74   FVC-Post L 3.35  3.28   FVC-Predicted Post % 74  71   Pre FEV1/FVC % % 75  74   Post FEV1/FCV % % 76  77   FEV1-Pre L 2.39  2.52   FEV1-Predicted Pre % 70  72   FEV1-Post L 2.56  2.53   DLCO uncorrected ml/min/mmHg  22.74   DLCO UNC% %  73   DLVA Predicted %  97   TLC L  5.50   TLC % Predicted %  80   RV % Predicted %  113      Lab Results  Component Value Date   NITRICOXIDE 11 01/26/2017     Assessment & Plan:  James [name] is a 68 year old male with emphysema, bronchiectasis, and asthma who presents with persistent wheezing and shortness of breath. He was referred by Dr. Alva for close follow up of his respiratory symptoms.  Assessment & Plan Community acquired pneumonia of right lower lobe of lung -  Near complete resolution on Chest XR Chronic respiratory failure with hypoxia (HCC) -  Continue supplemental oxygen  for now; patient wishes to wean off of this but remains on a prednisone  taper presently -  Consider follow up walk test once illness resolved Chronic asthmatic bronchitis (HCC) -  Slowly improving on steroid taper -  Resume Advair  -  change Duonebs from Q6h  to twice daily -  use albuterol  every 4-6h as needed -  Continue incentive spirometry -  Will need PFTs to further quantify lung function and follow disease process; active order on chart   Return in about 6 weeks (around 06/19/2024) for PFT.  Candis Dandy, PA-C 05/08/2024

## 2024-05-10 ENCOUNTER — Ambulatory Visit: Payer: Self-pay | Admitting: Pulmonary Disease

## 2024-05-10 ENCOUNTER — Other Ambulatory Visit (HOSPITAL_COMMUNITY): Payer: Self-pay

## 2024-05-14 ENCOUNTER — Other Ambulatory Visit (HOSPITAL_COMMUNITY): Payer: Self-pay

## 2024-05-14 MED ORDER — METOCLOPRAMIDE HCL 5 MG PO TABS
5.0000 mg | ORAL_TABLET | Freq: Two times a day (BID) | ORAL | 4 refills | Status: AC
  Filled 2024-05-14: qty 180, 90d supply, fill #0

## 2024-05-21 ENCOUNTER — Other Ambulatory Visit (HOSPITAL_BASED_OUTPATIENT_CLINIC_OR_DEPARTMENT_OTHER): Payer: Self-pay

## 2024-05-21 MED ORDER — COMIRNATY 30 MCG/0.3ML IM SUSY
0.3000 mL | PREFILLED_SYRINGE | Freq: Once | INTRAMUSCULAR | 0 refills | Status: AC
  Filled 2024-05-21: qty 0.3, 1d supply, fill #0

## 2024-05-25 ENCOUNTER — Other Ambulatory Visit (HOSPITAL_COMMUNITY): Payer: Self-pay

## 2024-06-01 ENCOUNTER — Other Ambulatory Visit (HOSPITAL_COMMUNITY): Payer: Self-pay

## 2024-06-01 MED ORDER — HYDROCODONE-ACETAMINOPHEN 5-325 MG PO TABS
1.0000 | ORAL_TABLET | Freq: Three times a day (TID) | ORAL | 0 refills | Status: DC | PRN
  Filled 2024-06-01: qty 60, 20d supply, fill #0

## 2024-06-02 ENCOUNTER — Other Ambulatory Visit (HOSPITAL_COMMUNITY): Payer: Self-pay

## 2024-06-06 ENCOUNTER — Other Ambulatory Visit (HOSPITAL_COMMUNITY): Payer: Self-pay

## 2024-06-06 ENCOUNTER — Other Ambulatory Visit: Payer: Self-pay

## 2024-06-07 ENCOUNTER — Other Ambulatory Visit (HOSPITAL_COMMUNITY): Payer: Self-pay

## 2024-06-12 ENCOUNTER — Other Ambulatory Visit (HOSPITAL_BASED_OUTPATIENT_CLINIC_OR_DEPARTMENT_OTHER): Payer: Self-pay | Admitting: Pulmonary Disease

## 2024-06-12 ENCOUNTER — Other Ambulatory Visit: Payer: Self-pay

## 2024-06-12 ENCOUNTER — Other Ambulatory Visit (HOSPITAL_COMMUNITY): Payer: Self-pay

## 2024-06-12 MED ORDER — ALBUTEROL SULFATE HFA 108 (90 BASE) MCG/ACT IN AERS
1.0000 | INHALATION_SPRAY | RESPIRATORY_TRACT | 3 refills | Status: DC | PRN
  Filled 2024-06-12: qty 6.7, 33d supply, fill #0
  Filled 2024-06-13: qty 6.7, fill #0

## 2024-06-12 MED ORDER — ALBUTEROL SULFATE HFA 108 (90 BASE) MCG/ACT IN AERS
2.0000 | INHALATION_SPRAY | Freq: Four times a day (QID) | RESPIRATORY_TRACT | 0 refills | Status: DC | PRN
  Filled 2024-06-12 (×2): qty 6.7, 28d supply, fill #0

## 2024-06-13 ENCOUNTER — Other Ambulatory Visit (HOSPITAL_COMMUNITY): Payer: Self-pay

## 2024-06-15 ENCOUNTER — Other Ambulatory Visit (HOSPITAL_COMMUNITY): Payer: Self-pay

## 2024-06-16 ENCOUNTER — Other Ambulatory Visit (HOSPITAL_COMMUNITY): Payer: Self-pay

## 2024-06-16 ENCOUNTER — Other Ambulatory Visit (HOSPITAL_BASED_OUTPATIENT_CLINIC_OR_DEPARTMENT_OTHER): Payer: Self-pay

## 2024-06-16 DIAGNOSIS — J069 Acute upper respiratory infection, unspecified: Secondary | ICD-10-CM

## 2024-06-17 ENCOUNTER — Other Ambulatory Visit (HOSPITAL_COMMUNITY): Payer: Self-pay

## 2024-06-18 ENCOUNTER — Other Ambulatory Visit: Payer: Self-pay

## 2024-06-18 ENCOUNTER — Other Ambulatory Visit (HOSPITAL_COMMUNITY): Payer: Self-pay

## 2024-06-18 MED ORDER — PROMETHAZINE-DM 6.25-15 MG/5ML PO SYRP
5.0000 mL | ORAL_SOLUTION | Freq: Four times a day (QID) | ORAL | 0 refills | Status: DC | PRN
  Filled 2024-06-18: qty 180, 9d supply, fill #0

## 2024-06-23 ENCOUNTER — Other Ambulatory Visit (HOSPITAL_COMMUNITY): Payer: Self-pay

## 2024-06-24 ENCOUNTER — Other Ambulatory Visit (HOSPITAL_COMMUNITY): Payer: Self-pay

## 2024-06-24 MED ORDER — PROMETHAZINE HCL 25 MG PO TABS
25.0000 mg | ORAL_TABLET | Freq: Three times a day (TID) | ORAL | 3 refills | Status: AC | PRN
  Filled 2024-06-24: qty 90, 30d supply, fill #0

## 2024-06-25 ENCOUNTER — Ambulatory Visit (INDEPENDENT_AMBULATORY_CARE_PROVIDER_SITE_OTHER)

## 2024-06-25 ENCOUNTER — Encounter (HOSPITAL_BASED_OUTPATIENT_CLINIC_OR_DEPARTMENT_OTHER): Payer: Self-pay

## 2024-06-25 VITALS — BP 120/75 | HR 107 | Temp 97.7°F | Ht 69.0 in | Wt 157.0 lb

## 2024-06-25 DIAGNOSIS — J439 Emphysema, unspecified: Secondary | ICD-10-CM

## 2024-06-25 DIAGNOSIS — Z87891 Personal history of nicotine dependence: Secondary | ICD-10-CM

## 2024-06-25 DIAGNOSIS — R0602 Shortness of breath: Secondary | ICD-10-CM

## 2024-06-25 DIAGNOSIS — J45998 Other asthma: Secondary | ICD-10-CM

## 2024-06-25 DIAGNOSIS — R5081 Fever presenting with conditions classified elsewhere: Secondary | ICD-10-CM

## 2024-06-25 DIAGNOSIS — M546 Pain in thoracic spine: Secondary | ICD-10-CM

## 2024-06-25 DIAGNOSIS — M549 Dorsalgia, unspecified: Secondary | ICD-10-CM

## 2024-06-25 DIAGNOSIS — J9611 Chronic respiratory failure with hypoxia: Secondary | ICD-10-CM

## 2024-06-25 DIAGNOSIS — R1312 Dysphagia, oropharyngeal phase: Secondary | ICD-10-CM

## 2024-06-25 DIAGNOSIS — J42 Unspecified chronic bronchitis: Secondary | ICD-10-CM

## 2024-06-25 DIAGNOSIS — R634 Abnormal weight loss: Secondary | ICD-10-CM

## 2024-06-25 DIAGNOSIS — J4489 Other specified chronic obstructive pulmonary disease: Secondary | ICD-10-CM

## 2024-06-25 DIAGNOSIS — J479 Bronchiectasis, uncomplicated: Secondary | ICD-10-CM

## 2024-06-25 LAB — PULMONARY FUNCTION TEST
DL/VA % pred: 85 %
DL/VA: 3.48 ml/min/mmHg/L
DLCO unc % pred: 52 %
DLCO unc: 13.26 ml/min/mmHg
FEF 25-75 Post: 1.02 L/s
FEF 25-75 Pre: 0.73 L/s
FEF2575-%Change-Post: 40 %
FEF2575-%Pred-Post: 41 %
FEF2575-%Pred-Pre: 29 %
FEV1-%Change-Post: 13 %
FEV1-%Pred-Post: 44 %
FEV1-%Pred-Pre: 39 %
FEV1-Post: 1.42 L
FEV1-Pre: 1.25 L
FEV1FVC-%Change-Post: 1 %
FEV1FVC-%Pred-Pre: 84 %
FEV6-%Change-Post: 11 %
FEV6-%Pred-Post: 54 %
FEV6-%Pred-Pre: 49 %
FEV6-Post: 2.23 L
FEV6-Pre: 2 L
FEV6FVC-%Change-Post: 0 %
FEV6FVC-%Pred-Post: 105 %
FEV6FVC-%Pred-Pre: 105 %
FVC-%Change-Post: 11 %
FVC-%Pred-Post: 51 %
FVC-%Pred-Pre: 46 %
FVC-Post: 2.23 L
FVC-Pre: 2 L
Post FEV1/FVC ratio: 64 %
Post FEV6/FVC ratio: 100 %
Pre FEV1/FVC ratio: 63 %
Pre FEV6/FVC Ratio: 100 %
RV % pred: 147 %
RV: 3.5 L
TLC % pred: 81 %
TLC: 5.53 L

## 2024-06-25 LAB — CBC WITH DIFFERENTIAL/PLATELET
Basophils Absolute: 0.1 x10E3/uL (ref 0.0–0.2)
Basos: 1 %
EOS (ABSOLUTE): 0.3 x10E3/uL (ref 0.0–0.4)
Eos: 2 %
Hematocrit: 40.7 % (ref 37.5–51.0)
Hemoglobin: 13.4 g/dL (ref 13.0–17.7)
Immature Grans (Abs): 0.1 x10E3/uL (ref 0.0–0.1)
Immature Granulocytes: 1 %
Lymphocytes Absolute: 1 x10E3/uL (ref 0.7–3.1)
Lymphs: 7 %
MCH: 30.5 pg (ref 26.6–33.0)
MCHC: 32.9 g/dL (ref 31.5–35.7)
MCV: 93 fL (ref 79–97)
Monocytes Absolute: 0.7 x10E3/uL (ref 0.1–0.9)
Monocytes: 5 %
Neutrophils Absolute: 12.7 x10E3/uL — ABNORMAL HIGH (ref 1.4–7.0)
Neutrophils: 84 %
Platelets: 568 x10E3/uL — ABNORMAL HIGH (ref 150–450)
RBC: 4.4 x10E6/uL (ref 4.14–5.80)
RDW: 12.2 % (ref 11.6–15.4)
WBC: 14.8 x10E3/uL — ABNORMAL HIGH (ref 3.4–10.8)

## 2024-06-25 LAB — COMPREHENSIVE METABOLIC PANEL WITH GFR
ALT: 14 IU/L (ref 0–44)
AST: 11 IU/L (ref 0–40)
Albumin: 3.7 g/dL — ABNORMAL LOW (ref 3.9–4.9)
Alkaline Phosphatase: 135 IU/L — ABNORMAL HIGH (ref 47–123)
BUN/Creatinine Ratio: 20 (ref 10–24)
BUN: 18 mg/dL (ref 8–27)
Bilirubin Total: 0.3 mg/dL (ref 0.0–1.2)
CO2: 20 mmol/L (ref 20–29)
Calcium: 9.8 mg/dL (ref 8.6–10.2)
Chloride: 97 mmol/L (ref 96–106)
Creatinine, Ser: 0.89 mg/dL (ref 0.76–1.27)
Globulin, Total: 3.5 g/dL (ref 1.5–4.5)
Glucose: 186 mg/dL — ABNORMAL HIGH (ref 70–99)
Potassium: 5.1 mmol/L (ref 3.5–5.2)
Sodium: 137 mmol/L (ref 134–144)
Total Protein: 7.2 g/dL (ref 6.0–8.5)
eGFR: 93 mL/min/1.73 (ref >59)

## 2024-06-25 NOTE — Patient Instructions (Signed)
 Full PFT performed today.

## 2024-06-25 NOTE — Progress Notes (Signed)
 Full PFT performed today.

## 2024-06-25 NOTE — Patient Instructions (Signed)
 Complete Chest XR and lab work as ordered; will call with results- further workup pending these results.  Follow up in 4-6 weeks; sooner pending results.

## 2024-06-25 NOTE — Progress Notes (Signed)
 @Patient  ID: James [name], male    DOB: [birth year], 68 y.o.   MRN: [id number]  Chief Complaint  Patient presents with   Follow-up    Referring provider: Nichole Senior, MD  HPI: Discussed the use of AI scribe software for clinical note transcription with the patient, who gave verbal consent to proceed.  History of Present Illness James [name] is a 68 year old male who presents with nausea, back pain, and weight loss.  He experiences persistent nausea and a significant decrease in appetite, leading to a weight loss from 166 pounds to 157 pounds since October 21st. Prior to his pneumonia illness in September, he reports weighing 184 pounds. He uses Phenergan  for nausea relief.  He reports right upper back pain that feels deep and extends into the chest, ongoing for approximately three weeks. The pain is severe, especially when coughing, which he tries to avoid due to the intensity of the pain. He uses Tylenol  and ibuprofen  for pain management after exhausting his supply of chronic pain medication.  He experiences low energy levels, spending most of his time in a recliner chair and only getting up to use the bathroom. He feels weak and has low-grade fevers, with a temperature of 100.40F noted despite taking Tylenol  an hour prior. He feels hot and cold but denies any sweats.  He was hospitalized in September for pneumonia, with imaging showing right middle and lower lobe lung infection. He experiences shortness of breath, which fluctuates in severity, and has become dependent on oxygen  since his last hospitalization. During a recent trip, his oxygen  saturation remained above 90% without supplemental oxygen , but he resumed using it upon returning home.  He underwent a swallow function test, which showed mild oropharyngeal dysphagia. His urine is typically yellow. He continues to take Advair  twice daily and uses albuterol  more frequently, primarily the inhaler rather than the nebulizer.   Last OV  05/08/2024: James [name] is a 68 year old male with emphysema, bronchiectasis, and asthma who presents with persistent wheezing and shortness of breath. He was referred by Dr. Alva for close follow up of his respiratory symptoms.  He is accompanied by his wife.    He experiences persistent wheezing and shortness of breath despite using prescribed medications. Oxygen  saturation levels range from 91% to 95%, yet he still feels short of breath, even when speaking in full sentences. DuoNeb causes significant jitteriness and anxiety, described as feeling like his 'skin was crawling'.   He has a history of pneumonia and was recently discharged from the hospital. This recent illness was particularly severe, requiring home oxygen  therapy, which he has never needed before. He is unsure of the prognosis regarding discontinuing oxygen  use. He has not had a pulmonary function test since 2020, with previous tests not completed due to illness and scheduling conflicts.   He has a history of asthma and has been told he has bronchiectasis; emphysema was discussed as a possible diagnosis. He has had cervical fusion surgeries, which have affected his swallowing and led to some aspiration. A barium swallow test was performed during his recent hospitalization.   He is currently on a tapering dose of prednisone  and uses Advair  twice daily; he reports that he is out of Advair  and has not been using it. He has DuoNeb and albuterol  for nebulizer treatments.   He has not yet received a COVID booster. He received a flu shot during his recent hospitalization. He is concerned about the timing of his next  pneumonia and its potential severity.   He is coughing up less sputum than before, with the sputum being mostly clear or white, and no longer green or bloody. He is not yet able to return to his usual outdoor and active lifestyle, but he is making slow progress.         TEST/EVENTS : Chest XR 05/03/2024:  IMPRESSION: Interval  improvement and near complete resolution of consolidation within the right lung most compatible with resolving pneumonia.  COMPARISON: Chest radiograph 04/13/2024   Allergies  Allergen Reactions   Augmentin  [Amoxicillin -Pot Clavulanate] Nausea And Vomiting   Ivp Dye [Iodinated Contrast Media] Other (See Comments)    Dizziness/sweating ALSO   Lexapro [Escitalopram Oxalate] Itching    Immunization History  Administered Date(s) Administered   Fluad Quad(high Dose 65+) 04/15/2022   INFLUENZA, HIGH DOSE SEASONAL PF 04/13/2024   Influenza Split 04/19/2011, 05/22/2012, 04/17/2013, 04/18/2017, 03/28/2019   Influenza Whole 07/31/2007, 04/15/2009, 05/08/2010   Influenza, Quadrivalent, Recombinant, Inj, Pf 03/22/2018, 04/28/2020, 03/25/2021   Influenza,inj,Quad PF,6+ Mos 03/19/2015, 05/19/2016   Influenza-Unspecified 03/19/2015, 04/12/2017   PFIZER(Purple Top)SARS-COV-2 Vaccination 09/21/2019, 10/12/2019, 04/13/2020, 10/24/2020, 03/25/2021   PNEUMOCOCCAL CONJUGATE-20 05/01/2023   Pfizer Covid-19 Vaccine Bivalent Booster 71yrs & up 04/09/2021, 12/28/2021   Pfizer(Comirnaty )Fall Seasonal Vaccine 12 years and older 05/21/2024   Pneumococcal Polysaccharide-23 04/19/2003, 09/24/2011, 04/11/2019   Respiratory Syncytial Virus Vaccine ,Recomb Aduvanted(Arexvy ) 06/04/2022   Td 12/22/2000   Tdap 09/24/2011, 08/19/2013   Zoster Recombinant(Shingrix) 09/27/2019, 12/20/2019   Zoster, Live 09/27/2019, 12/20/2019    Past Medical History:  Diagnosis Date   Anxiety    Asthma    ASTHMATIC BRONCHITIS, ACUTE 10/25/2008   Bladder neck obstruction    CARPAL TUNNEL SYNDROME, BILATERAL 07/31/2007   issues resolved, no surgery   Cervical disc disease    CORONARY ARTERY DISEASE 04/03/2007   Depression    DIABETES MELLITUS, TYPE I 04/03/2007   Diabetic retinopathy associated with diabetes mellitus due to underlying condition (HCC) 04/03/2007   DM W/EYE MANIFESTATIONS, TYPE I, UNCONTROLLED 04/04/2007   DM  W/RENAL MNFST, TYPE I, UNCONTROLLED 04/04/2007   ED (erectile dysfunction)    History of kidney stones    HYPERLIPIDEMIA 04/04/2007   Hypothyroidism    Pneumonia    several times and again today (02/13/2104)   Renal insufficiency    Seizures (HCC)    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; none in the last couple years (02/12/2014)   Spinal stenosis     Tobacco History: Social History   Tobacco Use  Smoking Status Former   Current packs/Plasencia: 0.00   Average packs/Scheid: 1 pack/Kinch for 40.0 years (40.0 ttl pk-yrs)   Types: Cigarettes   Start date: 08/20/1979   Quit date: 08/20/2019   Years since quitting: 4.8   Passive exposure: Past  Smokeless Tobacco Never  Tobacco Comments   Successfully quit smoking 08/20/2019   Counseling given: Not Answered Tobacco comments: Successfully quit smoking 08/20/2019   Outpatient Medications Prior to Visit  Medication Sig Dispense Refill   acetaminophen  (TYLENOL ) 500 MG tablet Take 1,000 mg by mouth every 6 (six) hours as needed for mild pain or headache.     albuterol  (PROVENTIL ) (2.5 MG/3ML) 0.083% nebulizer solution Take 3 mLs by nebulization every 6 (six) hours as needed. 180 mL 3   albuterol  (VENTOLIN  HFA) 108 (90 Base) MCG/ACT inhaler INHALE 2 PUFFS BY MOUTH INTO THE LUNGS EVERY 6 HOURS AS NEEDED FOR WHEEZING OR SHORTNESS OF BREATH. 6.7 g 0   albuterol  (VENTOLIN  HFA) 108 (90 Base)  MCG/ACT inhaler Inhale 1 puff into the lungs every 4 (four) hours as needed. 6.7 g 3   aspirin  EC 81 MG tablet Take 1 tablet (81 mg total) by mouth daily. Swallow whole. (Patient taking differently: Take 81 mg by mouth at bedtime. Swallow whole.) 30 tablet 11   cetirizine (ZYRTEC) 10 MG tablet Take 10 mg by mouth at bedtime.     Continuous Blood Gluc Sensor (FREESTYLE LIBRE 3 SENSOR) MISC Change sensor every 14 days to monitor blood glucose continously 2 each 6   Continuous Glucose Sensor (FREESTYLE LIBRE 3 PLUS SENSOR) MISC Inject 1 Device into the skin See admin  instructions. Place a new sensor into the skin every 15 days     finasteride  (PROSCAR ) 5 MG tablet Take 1 tablet (5 mg total) by mouth daily. (Patient taking differently: Take 5 mg by mouth at bedtime.) 90 tablet 3   fluticasone -salmeterol (ADVAIR ) 250-50 MCG/ACT AEPB Inhale 1 puff into the lungs every 12 (twelve) hours. 60 each 11   Glucagon  (GVOKE HYPOPEN  2-PACK) 1 MG/0.2ML SOAJ Inject under the skin as directed for severe hypoglycemia. 0.4 mL 5   Glucagon  (GVOKE HYPOPEN  2-PACK) 1 MG/0.2ML SOAJ Use as directed under the skin for severe hypoglycemia. 0.4 mL 5   glucose blood (CONTOUR NEXT TEST) test strip USE 8 TIMES DAILY AS DIRECTED TO MONITOR BLOOD GLUCOSE 600 strip 4   guaiFENesin  (MUCINEX ) 600 MG 12 hr tablet Take 2 tablets (1,200 mg total) by mouth 2 (two) times daily. 120 tablet 0   HYDROcodone -acetaminophen  (NORCO/VICODIN) 5-325 MG tablet Take 1 tablet by mouth 3 (three) times daily as needed. 60 tablet 0   ibuprofen  (ADVIL ) 200 MG tablet Take 800 mg by mouth every 8 (eight) hours as needed (for pain).     insulin  aspart (NOVOLOG  FLEXPEN) 100 UNIT/ML FlexPen Inject up to 100 units subcutaneously per [place] as directed (ICR 20, ISF 20, target 120) 90 mL 3   insulin  glargine (LANTUS  SOLOSTAR) 100 UNIT/ML Solostar Pen Inject 25-30 Units into the skin daily as directed (Patient taking differently: Inject 27 Units into the skin daily before breakfast.) 45 mL 3   Insulin  Pen Needle (TECHLITE PEN NEEDLES) 31G X 8 MM MISC Use 1 pen needle 4-5 times daily as directed. 500 each 3   Insulin  Pen Needle 31G X 8 MM MISC Use 1 pen needle subcutaneously 4 - 5 times daily as directed. 500 each 3   Insulin  Syringe-Needle U-100 31G X 5/16 0.3 ML MISC use to inject insulin  6 to 8 times daily 750 each 3   ipratropium-albuterol  (DUONEB) 0.5-2.5 (3) MG/3ML SOLN Inhale 3 mLs into the lungs every 6 (six) hours as needed with nebulizer. 270 mL 1   ipratropium-albuterol  (DUONEB) 0.5-2.5 (3) MG/3ML SOLN Inhale 1 vial (3  mLs) every 6 (six) hours. 270 mL 1   levothyroxine  (SYNTHROID ) 100 MCG tablet Take 1 tablet (100 mcg total) by mouth daily. (Patient taking differently: Take 100 mcg by mouth at bedtime.) 90 tablet 4   metoCLOPramide  (REGLAN ) 5 MG tablet Take 1 tablet (5 mg total) by mouth 2 (two) times daily before a meal. 180 tablet 4   pantoprazole  (PROTONIX ) 40 MG tablet Take 1 tablet by mouth daily as directed. (Patient taking differently: Take 40 mg by mouth at bedtime.) 90 tablet 3   predniSONE  (DELTASONE ) 10 MG tablet Take 4 tabs  daily with food x 4 days, then 3 tabs daily x 4 days, then 2 tabs daily x 4 days, then 1 tab  daily x4 days then stop. 40 tablet 0   promethazine  (PHENERGAN ) 25 MG tablet Take 1 tablet (25 mg total) by mouth 3 (three) times daily as needed. 90 tablet 3   promethazine -dextromethorphan  (PROMETHAZINE -DM) 6.25-15 MG/5ML syrup Take 5 mLs by mouth 4 (four) times daily as needed for cough. 180 mL 0   simvastatin  (ZOCOR ) 40 MG tablet Take 1 tablet (40 mg total) by mouth daily. (Patient taking differently: Take 40 mg by mouth at bedtime.) 90 tablet 3   sodium chloride  (OCEAN) 0.65 % SOLN nasal spray Place 1 spray into both nostrils as needed for congestion.     tamsulosin  (FLOMAX ) 0.4 MG CAPS capsule Take 1 capsule (0.4 mg total) by mouth daily. (Patient taking differently: Take 0.4 mg by mouth at bedtime.) 90 capsule 3   traZODone  (DESYREL ) 150 MG tablet Take 1 tablet (150 mg total) by mouth at bedtime as needed for sleep (Patient taking differently: Take 150 mg by mouth See admin instructions. Take 150 mg by mouth at bedtime) 90 tablet 4   No facility-administered medications prior to visit.     Review of Systems: as per HPI  Constitutional:   No  weight loss, night sweats,  Fevers, chills, fatigue, or  lassitude.  HEENT:   No headaches,  Difficulty swallowing,  Tooth/dental problems, or  Sore throat,                No sneezing, itching, ear ache, nasal congestion, post nasal drip,    CV:  No chest pain,  Orthopnea, PND, swelling in lower extremities, anasarca, dizziness, palpitations, syncope.   GI  No heartburn, indigestion, abdominal pain, nausea, vomiting, diarrhea, change in bowel habits, loss of appetite, bloody stools.   Resp: No shortness of breath with exertion or at rest.  No excess mucus, no productive cough,  No non-productive cough,  No coughing up of blood.  No change in color of mucus.  No wheezing.  No chest wall deformity  Skin: no rash or lesions.  GU: no dysuria, change in color of urine, no urgency or frequency.  No flank pain, no hematuria   MS:  No joint pain or swelling.  No decreased range of motion.  No back pain.    Physical Exam  BP 120/75   Pulse (!) 107   Temp 97.7 F (36.5 C)   Ht 5' 9 (1.753 m)   Wt 157 lb (71.2 kg)   SpO2 95%   BMI 23.18 kg/m   GEN: A/Ox3; pleasant , Appears tired; wearing oxygen  via nasal cannula    HEENT:  [place]/AT,  EACs-clear, TMs-wnl, NOSE-clear, THROAT-clear, no lesions, no postnasal drip or exudate noted.   NECK:  Supple w/ fair ROM; no JVD; normal carotid impulses w/o bruits; no thyromegaly or nodules palpated; no lymphadenopathy.    RESP  Clear  P & A; w/o, wheezes/ rales/ or rhonchi. no accessory muscle use, no dullness to percussion  CARD:  RRR, no m/r/g, no peripheral edema, pulses intact, no cyanosis or clubbing.  GI:   Soft & nt; nml bowel sounds; no organomegaly or masses detected.   Musco: Warm bil, no deformities or joint swelling noted.   Neuro: alert, no focal deficits noted.    Skin: Warm, no lesions or rashes    Lab Results:  CBC    Component Value Date/Time   WBC 13.3 (H) 04/14/2024 0247   RBC 4.19 (L) 04/14/2024 0247   HGB 12.9 (L) 04/14/2024 0247   HGB 14.9 07/31/2021 1120  HCT 38.3 (L) 04/14/2024 0247   HCT 42.3 07/31/2021 1120   PLT 414 (H) 04/14/2024 0247   PLT 266 04/22/2020 0847   MCV 91.4 04/14/2024 0247   MCV 91 04/22/2020 0847   MCH 30.8 04/14/2024 0247    MCHC 33.7 04/14/2024 0247   RDW 11.9 04/14/2024 0247   RDW 12.7 04/22/2020 0847   LYMPHSABS 1.5 04/14/2024 0247   MONOABS 1.0 04/14/2024 0247   EOSABS 0.1 04/14/2024 0247   BASOSABS 0.0 04/14/2024 0247    BMET    Component Value Date/Time   NA 136 04/14/2024 0247   NA 137 07/31/2021 1120   K 4.3 04/14/2024 0247   CL 99 04/14/2024 0247   CO2 27 04/14/2024 0247   GLUCOSE 108 (H) 04/14/2024 0247   BUN 19 04/14/2024 0247   BUN 17 07/31/2021 1120   CREATININE 0.86 04/14/2024 0247   CALCIUM 9.1 04/14/2024 0247   GFRNONAA >60 04/14/2024 0247   GFRAA >60 12/02/2019 0912    BNP    Component Value Date/Time   BNP 48.7 06/30/2019 0619    ProBNP    Component Value Date/Time   PROBNP 42 07/31/2021 1120    Imaging: No results found.  methylPREDNISolone  acetate (DEPO-MEDROL ) injection 80 mg     Date Action Dose Route User   05/03/2024 1202 Given 80 mg Intramuscular (Right Quadriceps) Trudy Warren CROME, CMA          Latest Ref Rng & Units 06/25/2024   10:59 AM 09/18/2018   10:21 AM 05/22/2015   10:01 AM  PFT Results  FVC-Pre L 2.00  P 3.17  3.41   FVC-Predicted Pre % 46  P 70  74   FVC-Post L 2.23  P 3.35  3.28   FVC-Predicted Post % 51  P 74  71   Pre FEV1/FVC % % 63  P 75  74   Post FEV1/FCV % % 64  P 76  77   FEV1-Pre L 1.25  P 2.39  2.52   FEV1-Predicted Pre % 39  P 70  72   FEV1-Post L 1.42  P 2.56  2.53   DLCO uncorrected ml/min/mmHg 13.26  P  22.74   DLCO UNC% % 52  P  73   DLVA Predicted % 85  P  97   TLC L 5.53  P  5.50   TLC % Predicted % 81  P  80   RV % Predicted % 147  P  113     P Preliminary result    Lab Results  Component Value Date   NITRICOXIDE 11 01/26/2017     Assessment & Plan:   Assessment & Plan Shortness of breath  Asthma, persistent controlled  Chronic respiratory failure with hypoxia (HCC)  Right-sided thoracic back pain, unspecified chronicity  Assessment and Plan Assessment & Plan Chronic respiratory failure with  hypoxia due to emphysema, bronchiectasis, and chronic asthmatic bronchitis Chronic respiratory failure with hypoxia, dependent on oxygen  therapy post-hospitalization. Oxygen  saturation stable with supplemental oxygen . Lung function tests show reduced lung flows and gas exchange, possibly due to acute process or disease progression. - Continue oxygen  therapy for comfort and hypoxia management.  Thoracic back pain Deep, right upper back pain extending into the chest, exacerbated by coughing.  - Ordered chest x-ray to evaluate for underlying causes of back pain. - Consider CT scan if x-ray is inconclusive.  Unintentional weight loss, nausea, decreased appetite, and generalized weakness Significant weight loss, nausea, decreased appetite, and generalized weakness  over the past few weeks. Possible contributing factors include recent illness, medication side effects, or underlying infection. - Ordered lab work to investigate potential causes of weight loss and weakness; Check CBC, CMP, UA, and sputum C&S  Shortness of breath Intermittent shortness of breath, improved with oxygen  therapy. Possible exacerbation of underlying respiratory conditions or new infection. - Continue current respiratory medications including Advair  and albuterol  as needed. - Monitor respiratory status and adjust oxygen  therapy as necessary.  Low grade fever Intermittent low grade fever, possibly related to infection or inflammatory process. - Ordered lab work to assess for infection or other causes of fever.  Mild oropharyngeal dysphagia Recent swallow function test showing mild oral pharyngeal dysphagia. Potential risk for aspiration, which could contribute to recurrent pneumonia. - Continue to monitor for signs of aspiration and adjust care plan as needed.   Return in about 6 weeks (around 08/06/2024).  Candis Dandy, PA-C 06/25/2024

## 2024-06-26 ENCOUNTER — Other Ambulatory Visit (HOSPITAL_BASED_OUTPATIENT_CLINIC_OR_DEPARTMENT_OTHER): Payer: Self-pay

## 2024-06-26 ENCOUNTER — Other Ambulatory Visit: Payer: Self-pay

## 2024-06-26 ENCOUNTER — Other Ambulatory Visit (HOSPITAL_COMMUNITY): Payer: Self-pay

## 2024-06-26 DIAGNOSIS — J189 Pneumonia, unspecified organism: Secondary | ICD-10-CM

## 2024-06-26 MED ORDER — LEVOFLOXACIN 750 MG PO TABS
750.0000 mg | ORAL_TABLET | Freq: Every day | ORAL | 0 refills | Status: DC
  Filled 2024-06-26: qty 7, 7d supply, fill #0

## 2024-06-26 NOTE — Progress Notes (Signed)
 Chest XR reviewed:  demonstrates RUL pneumonia CBC with leukocytosis.  Discussed over the phone with patient and his wife.  Plan for oral Levaquin  (refused Augmentin  due to prior issues with intolerance due to GI side effects) and close follow up in 4-6 weeks.   Given recurrence of pneumonia despite adequate treatment, plan for sputum C&S to r/o resistant or unexpected bacteria; order placed.  May need Chest CT for follow up imaging to r/o postobstructive cause for recurrence.  Candis Dandy, PA-C

## 2024-06-26 NOTE — Addendum Note (Signed)
 Addended by: Osric Klopf on: 06/26/2024 12:22 PM   Modules accepted: Orders

## 2024-06-28 ENCOUNTER — Other Ambulatory Visit (HOSPITAL_COMMUNITY): Payer: Self-pay

## 2024-06-29 ENCOUNTER — Other Ambulatory Visit (HOSPITAL_COMMUNITY): Payer: Self-pay

## 2024-06-29 MED ORDER — HYDROCODONE-ACETAMINOPHEN 5-325 MG PO TABS
1.0000 | ORAL_TABLET | Freq: Three times a day (TID) | ORAL | 0 refills | Status: DC
  Filled 2024-06-29: qty 60, 20d supply, fill #0

## 2024-07-02 ENCOUNTER — Encounter (HOSPITAL_BASED_OUTPATIENT_CLINIC_OR_DEPARTMENT_OTHER): Payer: Self-pay

## 2024-07-03 ENCOUNTER — Other Ambulatory Visit (HOSPITAL_BASED_OUTPATIENT_CLINIC_OR_DEPARTMENT_OTHER): Payer: Self-pay | Admitting: Pulmonary Disease

## 2024-07-03 ENCOUNTER — Other Ambulatory Visit (HOSPITAL_COMMUNITY): Payer: Self-pay

## 2024-07-03 MED ORDER — ALBUTEROL SULFATE HFA 108 (90 BASE) MCG/ACT IN AERS: 2.0000 | INHALATION_SPRAY | Freq: Four times a day (QID) | RESPIRATORY_TRACT | 3 refills | Status: AC | PRN

## 2024-07-03 NOTE — Telephone Encounter (Signed)
 Please advise

## 2024-07-04 NOTE — Telephone Encounter (Signed)
 FYI

## 2024-07-05 ENCOUNTER — Other Ambulatory Visit (HOSPITAL_COMMUNITY): Payer: Self-pay

## 2024-07-06 ENCOUNTER — Other Ambulatory Visit (HOSPITAL_COMMUNITY): Payer: Self-pay

## 2024-07-07 ENCOUNTER — Other Ambulatory Visit (HOSPITAL_COMMUNITY): Payer: Self-pay

## 2024-07-09 ENCOUNTER — Other Ambulatory Visit (HOSPITAL_COMMUNITY): Payer: Self-pay

## 2024-07-11 ENCOUNTER — Encounter (HOSPITAL_BASED_OUTPATIENT_CLINIC_OR_DEPARTMENT_OTHER): Payer: Self-pay

## 2024-07-11 ENCOUNTER — Other Ambulatory Visit (HOSPITAL_COMMUNITY): Payer: Self-pay

## 2024-07-13 ENCOUNTER — Other Ambulatory Visit (HOSPITAL_COMMUNITY): Payer: Self-pay

## 2024-07-18 ENCOUNTER — Inpatient Hospital Stay (HOSPITAL_COMMUNITY)
Admission: EM | Admit: 2024-07-18 | Discharge: 2024-07-24 | DRG: 871 | Disposition: A | Discharge status: Home / Self Care | Attending: Internal Medicine | Admitting: Internal Medicine

## 2024-07-18 ENCOUNTER — Other Ambulatory Visit: Payer: Self-pay

## 2024-07-18 ENCOUNTER — Other Ambulatory Visit (HOSPITAL_COMMUNITY): Payer: Self-pay

## 2024-07-18 ENCOUNTER — Encounter (HOSPITAL_COMMUNITY): Payer: Self-pay

## 2024-07-18 ENCOUNTER — Ambulatory Visit: Payer: Self-pay

## 2024-07-18 ENCOUNTER — Emergency Department (HOSPITAL_COMMUNITY)

## 2024-07-18 DIAGNOSIS — E1043 Type 1 diabetes mellitus with diabetic autonomic (poly)neuropathy: Secondary | ICD-10-CM | POA: Diagnosis present

## 2024-07-18 DIAGNOSIS — Z79890 Hormone replacement therapy (postmenopausal): Secondary | ICD-10-CM

## 2024-07-18 DIAGNOSIS — D649 Anemia, unspecified: Secondary | ICD-10-CM | POA: Diagnosis present

## 2024-07-18 DIAGNOSIS — A419 Sepsis, unspecified organism: Principal | ICD-10-CM | POA: Diagnosis present

## 2024-07-18 DIAGNOSIS — R110 Nausea: Secondary | ICD-10-CM | POA: Diagnosis present

## 2024-07-18 DIAGNOSIS — J9611 Chronic respiratory failure with hypoxia: Secondary | ICD-10-CM | POA: Diagnosis present

## 2024-07-18 DIAGNOSIS — N200 Calculus of kidney: Secondary | ICD-10-CM | POA: Diagnosis present

## 2024-07-18 DIAGNOSIS — E785 Hyperlipidemia, unspecified: Secondary | ICD-10-CM | POA: Diagnosis present

## 2024-07-18 DIAGNOSIS — E1059 Type 1 diabetes mellitus with other circulatory complications: Secondary | ICD-10-CM | POA: Diagnosis present

## 2024-07-18 DIAGNOSIS — K5900 Constipation, unspecified: Secondary | ICD-10-CM | POA: Diagnosis present

## 2024-07-18 DIAGNOSIS — I152 Hypertension secondary to endocrine disorders: Secondary | ICD-10-CM | POA: Diagnosis present

## 2024-07-18 DIAGNOSIS — N401 Enlarged prostate with lower urinary tract symptoms: Secondary | ICD-10-CM | POA: Diagnosis present

## 2024-07-18 DIAGNOSIS — E10319 Type 1 diabetes mellitus with unspecified diabetic retinopathy without macular edema: Secondary | ICD-10-CM | POA: Diagnosis present

## 2024-07-18 DIAGNOSIS — Z79891 Long term (current) use of opiate analgesic: Secondary | ICD-10-CM

## 2024-07-18 DIAGNOSIS — Z91041 Radiographic dye allergy status: Secondary | ICD-10-CM

## 2024-07-18 DIAGNOSIS — M542 Cervicalgia: Secondary | ICD-10-CM | POA: Diagnosis present

## 2024-07-18 DIAGNOSIS — J4489 Other specified chronic obstructive pulmonary disease: Secondary | ICD-10-CM | POA: Diagnosis present

## 2024-07-18 DIAGNOSIS — K3184 Gastroparesis: Secondary | ICD-10-CM | POA: Diagnosis present

## 2024-07-18 DIAGNOSIS — J189 Pneumonia, unspecified organism: Secondary | ICD-10-CM | POA: Diagnosis present

## 2024-07-18 DIAGNOSIS — Z1152 Encounter for screening for COVID-19: Secondary | ICD-10-CM

## 2024-07-18 DIAGNOSIS — Z7951 Long term (current) use of inhaled steroids: Secondary | ICD-10-CM

## 2024-07-18 DIAGNOSIS — G894 Chronic pain syndrome: Secondary | ICD-10-CM | POA: Diagnosis present

## 2024-07-18 DIAGNOSIS — F419 Anxiety disorder, unspecified: Secondary | ICD-10-CM | POA: Diagnosis present

## 2024-07-18 DIAGNOSIS — R634 Abnormal weight loss: Secondary | ICD-10-CM

## 2024-07-18 DIAGNOSIS — Z87891 Personal history of nicotine dependence: Secondary | ICD-10-CM

## 2024-07-18 DIAGNOSIS — R630 Anorexia: Secondary | ICD-10-CM

## 2024-07-18 DIAGNOSIS — M546 Pain in thoracic spine: Secondary | ICD-10-CM

## 2024-07-18 DIAGNOSIS — Z79899 Other long term (current) drug therapy: Secondary | ICD-10-CM

## 2024-07-18 DIAGNOSIS — M48062 Spinal stenosis, lumbar region with neurogenic claudication: Secondary | ICD-10-CM

## 2024-07-18 DIAGNOSIS — Z87442 Personal history of urinary calculi: Secondary | ICD-10-CM

## 2024-07-18 DIAGNOSIS — E43 Unspecified severe protein-calorie malnutrition: Secondary | ICD-10-CM | POA: Diagnosis present

## 2024-07-18 DIAGNOSIS — E039 Hypothyroidism, unspecified: Secondary | ICD-10-CM | POA: Diagnosis present

## 2024-07-18 DIAGNOSIS — Z833 Family history of diabetes mellitus: Secondary | ICD-10-CM

## 2024-07-18 DIAGNOSIS — J470 Bronchiectasis with acute lower respiratory infection: Secondary | ICD-10-CM | POA: Diagnosis present

## 2024-07-18 DIAGNOSIS — J449 Chronic obstructive pulmonary disease, unspecified: Secondary | ICD-10-CM | POA: Diagnosis present

## 2024-07-18 DIAGNOSIS — Z794 Long term (current) use of insulin: Secondary | ICD-10-CM

## 2024-07-18 DIAGNOSIS — E109 Type 1 diabetes mellitus without complications: Secondary | ICD-10-CM | POA: Diagnosis present

## 2024-07-18 DIAGNOSIS — I2510 Atherosclerotic heart disease of native coronary artery without angina pectoris: Secondary | ICD-10-CM | POA: Diagnosis present

## 2024-07-18 DIAGNOSIS — Z981 Arthrodesis status: Secondary | ICD-10-CM

## 2024-07-18 DIAGNOSIS — N400 Enlarged prostate without lower urinary tract symptoms: Secondary | ICD-10-CM | POA: Diagnosis present

## 2024-07-18 DIAGNOSIS — R6881 Early satiety: Secondary | ICD-10-CM | POA: Diagnosis present

## 2024-07-18 DIAGNOSIS — E1159 Type 2 diabetes mellitus with other circulatory complications: Secondary | ICD-10-CM | POA: Diagnosis present

## 2024-07-18 DIAGNOSIS — J440 Chronic obstructive pulmonary disease with acute lower respiratory infection: Secondary | ICD-10-CM | POA: Diagnosis present

## 2024-07-18 DIAGNOSIS — Z6822 Body mass index (BMI) 22.0-22.9, adult: Secondary | ICD-10-CM

## 2024-07-18 DIAGNOSIS — Z823 Family history of stroke: Secondary | ICD-10-CM

## 2024-07-18 DIAGNOSIS — F32A Depression, unspecified: Secondary | ICD-10-CM | POA: Diagnosis present

## 2024-07-18 DIAGNOSIS — Z7982 Long term (current) use of aspirin: Secondary | ICD-10-CM

## 2024-07-18 DIAGNOSIS — M5010 Cervical disc disorder with radiculopathy, unspecified cervical region: Secondary | ICD-10-CM

## 2024-07-18 DIAGNOSIS — Z9981 Dependence on supplemental oxygen: Secondary | ICD-10-CM | POA: Diagnosis not present

## 2024-07-18 DIAGNOSIS — Z8249 Family history of ischemic heart disease and other diseases of the circulatory system: Secondary | ICD-10-CM

## 2024-07-18 DIAGNOSIS — Z888 Allergy status to other drugs, medicaments and biological substances status: Secondary | ICD-10-CM

## 2024-07-18 DIAGNOSIS — Z880 Allergy status to penicillin: Secondary | ICD-10-CM

## 2024-07-18 LAB — PRO BRAIN NATRIURETIC PEPTIDE: Pro Brain Natriuretic Peptide: 190 pg/mL

## 2024-07-18 LAB — CBC WITH DIFFERENTIAL/PLATELET
Abs Immature Granulocytes: 0.06 K/uL (ref 0.00–0.07)
Basophils Absolute: 0.1 K/uL (ref 0.0–0.1)
Basophils Relative: 1 %
Eosinophils Absolute: 0.3 K/uL (ref 0.0–0.5)
Eosinophils Relative: 2 %
HCT: 35.8 % — ABNORMAL LOW (ref 39.0–52.0)
Hemoglobin: 12.1 g/dL — ABNORMAL LOW (ref 13.0–17.0)
Immature Granulocytes: 0 %
Lymphocytes Relative: 6 %
Lymphs Abs: 1 K/uL (ref 0.7–4.0)
MCH: 30.5 pg (ref 26.0–34.0)
MCHC: 33.8 g/dL (ref 30.0–36.0)
MCV: 90.2 fL (ref 80.0–100.0)
Monocytes Absolute: 0.8 K/uL (ref 0.1–1.0)
Monocytes Relative: 5 %
Neutro Abs: 14.4 K/uL — ABNORMAL HIGH (ref 1.7–7.7)
Neutrophils Relative %: 86 %
Platelets: 489 K/uL — ABNORMAL HIGH (ref 150–400)
RBC: 3.97 MIL/uL — ABNORMAL LOW (ref 4.22–5.81)
RDW: 12.2 % (ref 11.5–15.5)
WBC: 16.6 K/uL — ABNORMAL HIGH (ref 4.0–10.5)
nRBC: 0 % (ref 0.0–0.2)

## 2024-07-18 LAB — URINALYSIS, ROUTINE W REFLEX MICROSCOPIC
Bacteria, UA: NONE SEEN
Bilirubin Urine: NEGATIVE
Glucose, UA: 50 mg/dL — AB
Ketones, ur: 20 mg/dL — AB
Leukocytes,Ua: NEGATIVE
Nitrite: NEGATIVE
Protein, ur: 30 mg/dL — AB
Specific Gravity, Urine: 1.02 (ref 1.005–1.030)
pH: 5 (ref 5.0–8.0)

## 2024-07-18 LAB — PROTIME-INR
INR: 1.1 (ref 0.8–1.2)
Prothrombin Time: 14.7 s (ref 11.4–15.2)

## 2024-07-18 LAB — COMPREHENSIVE METABOLIC PANEL WITH GFR
ALT: 6 U/L (ref 0–44)
AST: 12 U/L — ABNORMAL LOW (ref 15–41)
Albumin: 3.6 g/dL (ref 3.5–5.0)
Alkaline Phosphatase: 171 U/L — ABNORMAL HIGH (ref 38–126)
Anion gap: 14 (ref 5–15)
BUN: 15 mg/dL (ref 8–23)
CO2: 23 mmol/L (ref 22–32)
Calcium: 9.6 mg/dL (ref 8.9–10.3)
Chloride: 94 mmol/L — ABNORMAL LOW (ref 98–111)
Creatinine, Ser: 0.8 mg/dL (ref 0.61–1.24)
GFR, Estimated: >60 mL/min
Glucose, Bld: 240 mg/dL — ABNORMAL HIGH (ref 70–99)
Potassium: 4.5 mmol/L (ref 3.5–5.1)
Sodium: 132 mmol/L — ABNORMAL LOW (ref 135–145)
Total Bilirubin: 0.6 mg/dL (ref 0.0–1.2)
Total Protein: 8.2 g/dL — ABNORMAL HIGH (ref 6.5–8.1)

## 2024-07-18 LAB — TROPONIN T, HIGH SENSITIVITY: Troponin T High Sensitivity: <15 ng/L (ref 0–19)

## 2024-07-18 LAB — RESP PANEL BY RT-PCR (RSV, FLU A&B, COVID)  RVPGX2
Influenza A by PCR: NEGATIVE
Influenza B by PCR: NEGATIVE
Resp Syncytial Virus by PCR: NEGATIVE
SARS Coronavirus 2 by RT PCR: NEGATIVE

## 2024-07-18 LAB — I-STAT CG4 LACTIC ACID, ED: Lactic Acid, Venous: 1.7 mmol/L (ref 0.5–1.9)

## 2024-07-18 LAB — CBG MONITORING, ED: Glucose-Capillary: 158 mg/dL — ABNORMAL HIGH (ref 70–99)

## 2024-07-18 MED ORDER — INSULIN GLARGINE-YFGN 100 UNIT/ML ~~LOC~~ SOLN: 20.0000 [IU] | Freq: Every day | SUBCUTANEOUS | Status: DC

## 2024-07-18 MED ORDER — LEVOTHYROXINE SODIUM 100 MCG PO TABS
100.0000 ug | ORAL_TABLET | Freq: Every day | ORAL | Status: DC
  Administered 2024-07-18 – 2024-07-23 (×6): 100 ug via ORAL
  Filled 2024-07-18 (×6): qty 1

## 2024-07-18 MED ORDER — SODIUM CHLORIDE 0.9 % IV SOLN
2.0000 g | Freq: Once | INTRAVENOUS | Status: DC
  Filled 2024-07-18: qty 20

## 2024-07-18 MED ORDER — BUDESONIDE 0.25 MG/2ML IN SUSP
0.2500 mg | Freq: Two times a day (BID) | RESPIRATORY_TRACT | Status: DC
  Administered 2024-07-18 – 2024-07-24 (×12): 0.25 mg via RESPIRATORY_TRACT
  Filled 2024-07-18 (×12): qty 2

## 2024-07-18 MED ORDER — SODIUM CHLORIDE 0.9 % IV SOLN
500.0000 mg | INTRAVENOUS | Status: AC
  Administered 2024-07-19 – 2024-07-23 (×5): 500 mg via INTRAVENOUS
  Filled 2024-07-18 (×6): qty 5

## 2024-07-18 MED ORDER — SENNOSIDES-DOCUSATE SODIUM 8.6-50 MG PO TABS: 1.0000 | ORAL_TABLET | Freq: Every evening | ORAL | Status: DC | PRN

## 2024-07-18 MED ORDER — ARFORMOTEROL TARTRATE 15 MCG/2ML IN NEBU
15.0000 ug | INHALATION_SOLUTION | Freq: Two times a day (BID) | RESPIRATORY_TRACT | Status: DC
  Administered 2024-07-18 – 2024-07-24 (×12): 15 ug via RESPIRATORY_TRACT
  Filled 2024-07-18 (×12): qty 2

## 2024-07-18 MED ORDER — FINASTERIDE 5 MG PO TABS
5.0000 mg | ORAL_TABLET | Freq: Every day | ORAL | Status: DC
  Administered 2024-07-18 – 2024-07-23 (×6): 5 mg via ORAL
  Filled 2024-07-18 (×6): qty 1

## 2024-07-18 MED ORDER — PANTOPRAZOLE SODIUM 40 MG PO TBEC
40.0000 mg | DELAYED_RELEASE_TABLET | Freq: Every day | ORAL | Status: DC
  Administered 2024-07-18 – 2024-07-23 (×6): 40 mg via ORAL
  Filled 2024-07-18 (×6): qty 1

## 2024-07-18 MED ORDER — SODIUM CHLORIDE 0.9 % IV SOLN
1.0000 g | Freq: Once | INTRAVENOUS | Status: AC
  Administered 2024-07-18: 1 g via INTRAVENOUS
  Filled 2024-07-18: qty 10

## 2024-07-18 MED ORDER — TRAZODONE HCL 50 MG PO TABS
150.0000 mg | ORAL_TABLET | Freq: Every day | ORAL | Status: DC
  Administered 2024-07-18 – 2024-07-23 (×6): 150 mg via ORAL
  Filled 2024-07-18 (×5): qty 1
  Filled 2024-07-18: qty 2

## 2024-07-18 MED ORDER — ACETAMINOPHEN 650 MG RE SUPP: 650.0000 mg | Freq: Four times a day (QID) | RECTAL | Status: DC | PRN

## 2024-07-18 MED ORDER — ONDANSETRON HCL 4 MG/2ML IJ SOLN
4.0000 mg | Freq: Once | INTRAMUSCULAR | Status: AC
  Administered 2024-07-18: 4 mg via INTRAVENOUS
  Filled 2024-07-18: qty 2

## 2024-07-18 MED ORDER — ONDANSETRON HCL 4 MG/2ML IJ SOLN
4.0000 mg | Freq: Four times a day (QID) | INTRAMUSCULAR | Status: DC | PRN
  Administered 2024-07-19 – 2024-07-24 (×6): 4 mg via INTRAVENOUS
  Filled 2024-07-18 (×6): qty 2

## 2024-07-18 MED ORDER — FENTANYL CITRATE (PF) 50 MCG/ML IJ SOSY
50.0000 ug | PREFILLED_SYRINGE | Freq: Once | INTRAMUSCULAR | Status: AC
  Administered 2024-07-18: 50 ug via INTRAVENOUS
  Filled 2024-07-18: qty 1

## 2024-07-18 MED ORDER — TAMSULOSIN HCL 0.4 MG PO CAPS
0.4000 mg | ORAL_CAPSULE | Freq: Every day | ORAL | Status: DC
  Administered 2024-07-18 – 2024-07-23 (×6): 0.4 mg via ORAL
  Filled 2024-07-18 (×6): qty 1

## 2024-07-18 MED ORDER — INSULIN GLARGINE 100 UNIT/ML ~~LOC~~ SOLN
20.0000 [IU] | Freq: Every day | SUBCUTANEOUS | Status: DC
  Administered 2024-07-19 – 2024-07-21 (×3): 20 [IU] via SUBCUTANEOUS
  Filled 2024-07-18 (×3): qty 0.2

## 2024-07-18 MED ORDER — SODIUM CHLORIDE 0.9% FLUSH
3.0000 mL | Freq: Two times a day (BID) | INTRAVENOUS | Status: DC
  Administered 2024-07-19 – 2024-07-23 (×10): 3 mL via INTRAVENOUS

## 2024-07-18 MED ORDER — SODIUM CHLORIDE 0.9 % IV SOLN: 500.0000 mg | Freq: Once | INTRAVENOUS | Status: DC

## 2024-07-18 MED ORDER — SODIUM CHLORIDE 0.9 % IV SOLN
500.0000 mg | Freq: Once | INTRAVENOUS | Status: AC
  Administered 2024-07-18: 500 mg via INTRAVENOUS
  Filled 2024-07-18: qty 5

## 2024-07-18 MED ORDER — GUAIFENESIN ER 600 MG PO TB12
600.0000 mg | ORAL_TABLET | Freq: Two times a day (BID) | ORAL | Status: DC
  Administered 2024-07-18 – 2024-07-24 (×12): 600 mg via ORAL
  Filled 2024-07-18 (×12): qty 1

## 2024-07-18 MED ORDER — BISACODYL 5 MG PO TBEC: 5.0000 mg | DELAYED_RELEASE_TABLET | Freq: Every day | ORAL | Status: DC | PRN

## 2024-07-18 MED ORDER — SODIUM CHLORIDE 0.9 % IV SOLN: 2.0000 g | INTRAVENOUS | Status: DC

## 2024-07-18 MED ORDER — ACETAMINOPHEN 325 MG PO TABS: 650.0000 mg | ORAL_TABLET | Freq: Four times a day (QID) | ORAL | Status: DC | PRN

## 2024-07-18 MED ORDER — ENOXAPARIN SODIUM 40 MG/0.4ML IJ SOSY
40.0000 mg | PREFILLED_SYRINGE | INTRAMUSCULAR | Status: DC
  Administered 2024-07-19 – 2024-07-24 (×6): 40 mg via SUBCUTANEOUS
  Filled 2024-07-18 (×6): qty 0.4

## 2024-07-18 MED ORDER — LACTATED RINGERS IV BOLUS
1150.0000 mL | Freq: Once | INTRAVENOUS | Status: AC
  Administered 2024-07-18: 1150 mL via INTRAVENOUS

## 2024-07-18 MED ORDER — HYDROCODONE-ACETAMINOPHEN 5-325 MG PO TABS: 1.0000 | ORAL_TABLET | Freq: Three times a day (TID) | ORAL | Status: DC

## 2024-07-18 MED ORDER — INSULIN ASPART 100 UNIT/ML IJ SOLN
0.0000 [IU] | Freq: Three times a day (TID) | INTRAMUSCULAR | Status: DC
  Administered 2024-07-19: 1 [IU] via SUBCUTANEOUS
  Administered 2024-07-19: 3 [IU] via SUBCUTANEOUS
  Administered 2024-07-20 (×2): 2 [IU] via SUBCUTANEOUS
  Administered 2024-07-21: 5 [IU] via SUBCUTANEOUS
  Administered 2024-07-21: 3 [IU] via SUBCUTANEOUS
  Administered 2024-07-21: 2 [IU] via SUBCUTANEOUS
  Administered 2024-07-22: 5 [IU] via SUBCUTANEOUS
  Administered 2024-07-22: 3 [IU] via SUBCUTANEOUS
  Administered 2024-07-23: 2 [IU] via SUBCUTANEOUS
  Administered 2024-07-23 – 2024-07-24 (×2): 1 [IU] via SUBCUTANEOUS
  Administered 2024-07-24: 2 [IU] via SUBCUTANEOUS
  Filled 2024-07-18: qty 5
  Filled 2024-07-18 (×3): qty 2
  Filled 2024-07-18: qty 3
  Filled 2024-07-18: qty 2
  Filled 2024-07-18 (×3): qty 1
  Filled 2024-07-18: qty 5

## 2024-07-18 MED ORDER — ASPIRIN 81 MG PO TBEC
81.0000 mg | DELAYED_RELEASE_TABLET | Freq: Every day | ORAL | Status: DC
  Administered 2024-07-19 – 2024-07-23 (×5): 81 mg via ORAL
  Filled 2024-07-18 (×4): qty 1

## 2024-07-18 MED ORDER — INSULIN ASPART 100 UNIT/ML IJ SOLN
0.0000 [IU] | Freq: Every day | INTRAMUSCULAR | Status: DC
  Administered 2024-07-20: 5 [IU] via SUBCUTANEOUS
  Administered 2024-07-21: 2 [IU] via SUBCUTANEOUS
  Filled 2024-07-18: qty 2
  Filled 2024-07-18: qty 5

## 2024-07-18 MED ORDER — LACTATED RINGERS IV SOLN: INTRAVENOUS | Status: AC

## 2024-07-18 MED ORDER — HYDROCODONE-ACETAMINOPHEN 5-325 MG PO TABS
1.0000 | ORAL_TABLET | Freq: Three times a day (TID) | ORAL | Status: DC | PRN
  Administered 2024-07-19 (×2): 1 via ORAL
  Filled 2024-07-18 (×2): qty 1

## 2024-07-18 MED ORDER — PIPERACILLIN-TAZOBACTAM 3.375 G IVPB
3.3750 g | Freq: Three times a day (TID) | INTRAVENOUS | Status: DC
  Administered 2024-07-19 – 2024-07-24 (×17): 3.375 g via INTRAVENOUS
  Filled 2024-07-18 (×17): qty 50

## 2024-07-18 MED ORDER — SIMVASTATIN 20 MG PO TABS
40.0000 mg | ORAL_TABLET | Freq: Every day | ORAL | Status: DC
  Administered 2024-07-19 – 2024-07-23 (×6): 40 mg via ORAL
  Filled 2024-07-18: qty 2
  Filled 2024-07-18: qty 4
  Filled 2024-07-18 (×4): qty 2

## 2024-07-18 MED ORDER — ONDANSETRON HCL 4 MG PO TABS
4.0000 mg | ORAL_TABLET | Freq: Four times a day (QID) | ORAL | Status: DC | PRN
  Administered 2024-07-19: 4 mg via ORAL
  Filled 2024-07-18: qty 1

## 2024-07-18 MED ORDER — IPRATROPIUM-ALBUTEROL 0.5-2.5 (3) MG/3ML IN SOLN
3.0000 mL | Freq: Once | RESPIRATORY_TRACT | Status: AC
  Administered 2024-07-18: 3 mL via RESPIRATORY_TRACT
  Filled 2024-07-18: qty 3

## 2024-07-18 MED ORDER — IPRATROPIUM-ALBUTEROL 0.5-2.5 (3) MG/3ML IN SOLN
3.0000 mL | Freq: Four times a day (QID) | RESPIRATORY_TRACT | Status: DC | PRN
  Filled 2024-07-18: qty 3

## 2024-07-18 NOTE — H&P (Signed)
 " History and Physical    Presten Joost Dorer FMW:[id number] DOB: 16-Sep-1955 DOA: 07/18/2024  PCP: Nichole Senior, MD  Patient coming from: Home  I have personally briefly reviewed patient's old medical records in [facility]  Chief Complaint: Shortness of breath, cough  HPI: Dahmir Epperly Waltner is a 68 y.o. male with medical history significant for COPD/chronic asthmatic bronchitis and bronchiectasis with chronic respiratory failure with hypoxia on 2 L O2 [place], CAD, T1DM, HTN, HLD, hypothyroidism, BPH, depression/anxiety, chronic pain who presented to the ED for evaluation of shortness of breath.  Patient has a history of recurrent pneumonia as well as aspiration since prior cervical fusion.  He was last admitted September 2025 with acute hypoxic respiratory failure secondary to right-sided pneumonia and COPD exacerbation.  Since then he has been requiring 2 L supplemental O2 via [place] at baseline.  He had initial improvement for the first 2 weeks after discharge but since then has had progressively worsening dyspnea on exertion and cough.  He was seen in his pulmonology office earlier this month and diagnosed with right upper lobe pneumonia seen on chest x-ray 06/25/2024.  He completed in 1 week course of Levaquin .  He has not had any significant improvement.  He is still easily short of breath with minimal exertion.  He has frequent cough, mostly productive of clear sputum and occasionally sees follow-up green sputum.  He has been generally fatigued.  He reports frequent nausea without emesis.  Appetite has been low.  He reports approximately 35 pound weight loss since September.  He has occasional chills without fever or diaphoresis.  He has been having intermittent right upper chest pain, worse with deep inspiration.  ED Course  Labs/Imaging on admission: I have personally reviewed following labs and imaging studies.  Initial vitals showed BP 116/61, pulse 109, RR 22, temp 98.2 F, SpO2 95% on 2 L  supplemental O2 via [place].  Labs showed WBC 16.6, hemoglobin 12.1, platelets 49, proBNP 190, sodium 132, potassium 4.5, bicarb 23, BUN 15, creatinine 0.80, serum glucose 240, lactic acid 1.7.  Blood cultures in process.  SARS-CoV-2, influenza, RSV PCR negative.  Portable chest x-ray showed worsening right upper lobe pneumonia when compared to previous CXR 06/25/2024.  Patient was given 1150 mL LR bolus, IV ceftriaxone  and azithromycin , DuoNeb treatment.  The hospitalist service was consulted for admission.  Review of Systems: All systems reviewed and are negative except as documented in history of present illness above.   Past Medical History:  Diagnosis Date   Anxiety    Asthma    ASTHMATIC BRONCHITIS, ACUTE 10/25/2008   Bladder neck obstruction    CARPAL TUNNEL SYNDROME, BILATERAL 07/31/2007   issues resolved, no surgery   Cervical disc disease    CORONARY ARTERY DISEASE 04/03/2007   Depression    DIABETES MELLITUS, TYPE I 04/03/2007   Diabetic retinopathy associated with diabetes mellitus due to underlying condition (HCC) 04/03/2007   DM W/EYE MANIFESTATIONS, TYPE I, UNCONTROLLED 04/04/2007   DM W/RENAL MNFST, TYPE I, UNCONTROLLED 04/04/2007   ED (erectile dysfunction)    History of kidney stones    HYPERLIPIDEMIA 04/04/2007   Hypothyroidism    Pneumonia    several times and again today (02/13/2104)   Renal insufficiency    Seizures (HCC)    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; none in the last couple years (02/12/2014)   Spinal stenosis     Past Surgical History:  Procedure Laterality Date   ANTERIOR CERVICAL DECOMP/DISCECTOMY FUSION  2000  couple screws and a plate   ANTERIOR CERVICAL DECOMP/DISCECTOMY FUSION N/A 06/24/2016   Procedure: ANTERIOR CERVICAL DECOMPRESSION/DISCECTOMY FUSION CERVICAL FOUR - CERVICAL FIVE, CERVICAL FIVE - CERVICAL SIX; REMOVAL TETHER CERVICAL PLATE;  Surgeon: Lamar Peaches, MD;  Location: [facility] OR;  Service: Neurosurgery;  Laterality: N/A;   ANTERIOR CERVICAL DECOMPRESSION/DISCECTOMY FUSION CERVICAL FOUR - CERVICAL FIVE, CERVICAL FIVE - CERVICAL SIX; REMOVAL TETHER CERVICAL PLATE   APPENDECTOMY     BACK SURGERY     CARDIAC CATHETERIZATION  1990's   CATARACT EXTRACTION W/ INTRAOCULAR LENS  IMPLANT, BILATERAL Bilateral    CYSTOSCOPY WITH RETROGRADE PYELOGRAM, URETEROSCOPY AND STENT PLACEMENT Bilateral 04/06/2013   Procedure: BILATERAL CYSTOSCOPY WITH RETROGRADE PYELOGRAMS, STENT PLACEMENTS AND LEFT URETEROSCOPY AND STONE REMOVAL;  Surgeon: Ricardo Likens, MD;  Location: [facility];  Service: Urology;  Laterality: Bilateral;   CYSTOSCOPY WITH STENT PLACEMENT Right 04/12/2013   Procedure: CYSTOSCOPY WITH STENT PLACEMENT;  Surgeon: Ricardo Likens, MD;  Location: [facility];  Service: Urology;  Laterality: Right;   CYSTOSCOPY/RETROGRADE/URETEROSCOPY Bilateral 04/12/2013   Procedure: CYSTOSCOPY/RETROGRADE/URETEROSCOPY;  Surgeon: Ricardo Likens, MD;  Location: [facility];  Service: Urology;  Laterality: Bilateral;  RIGHT RETROGRADE    EXCISION MASS LOWER EXTREMETIES Left 03/01/2024   Procedure: EXCISION MASS LOWER EXTREMITIES;  Surgeon: Teresa Lonni HERO, MD;  Location: [facility];  Service: General;  Laterality: Left;  REMOVAL OF LEFT INNER THIGH MASS   HOLMIUM LASER APPLICATION Left 04/06/2013   Procedure: HOLMIUM LASER APPLICATION;  Surgeon: Ricardo Likens, MD;  Location: [facility];  Service: Urology;  Laterality: Left;   LEFT HEART CATH AND CORONARY ANGIOGRAPHY N/A 04/29/2020   Procedure: LEFT HEART CATH AND CORONARY ANGIOGRAPHY;  Surgeon: Elmira Newman PARAS, MD;  Location: [facility];  Service: Cardiovascular;  Laterality: N/A;   LUMBAR LAMINECTOMY/DECOMPRESSION MICRODISCECTOMY N/A 04/20/2019   Procedure: Lumbar microdisectomy and decompression L5-S1 left;  Surgeon: Heide Ingle, MD;  Location: [facility];  Service: Orthopedics;  Laterality: N/A;    LYMPH NODE DISSECTION  ~ 1960   groin   stress cardiolite  09/06/2002   TONSILLECTOMY      VITRECTOMY Bilateral     Social History: Social History[1]  Allergies[2]  Family History  Problem Relation Age of Onset   Stroke Father        strong FH cerebrovascular disease   Diabetes Brother    Heart disease Brother        CHF   Cancer Mother    Colon cancer Neg Hx      Prior to Admission medications  Medication Sig Start Date End Date Taking? Authorizing Provider  levofloxacin  (LEVAQUIN ) 750 MG tablet Take 1 tablet (750 mg total) by mouth daily. 06/26/24   Charley Conger, PA-C  acetaminophen  (TYLENOL ) 500 MG tablet Take 1,000 mg by mouth every 6 (six) hours as needed for mild pain or headache.    [provider]  albuterol  (PROVENTIL ) (2.5 MG/3ML) 0.083% nebulizer solution Take 3 mLs by nebulization every 6 (six) hours as needed. 04/18/24   Raenelle Coria, MD  albuterol  (VENTOLIN  HFA) 108 (90 Base) MCG/ACT inhaler Inhale 1 puff into the lungs every 4 (four) hours as needed. 06/12/24     albuterol  (VENTOLIN  HFA) 108 (90 Base) MCG/ACT inhaler Inhale 2 puffs into the lungs every 6 (six) hours as needed for wheezing or shortness of breath. 07/03/24   Jude Harden GAILS, MD  aspirin  EC 81 MG tablet Take 1 tablet (81 mg total) by mouth daily. Swallow whole. Patient taking differently: Take 81 mg  by mouth at bedtime. Swallow whole. 04/16/20   Tolia, Sunit, DO  cetirizine (ZYRTEC) 10 MG tablet Take 10 mg by mouth at bedtime.    [provider]  Continuous Blood Gluc Sensor (FREESTYLE LIBRE 3 SENSOR) MISC Change sensor every 14 days to monitor blood glucose continously 04/08/22     Continuous Glucose Sensor (FREESTYLE LIBRE 3 PLUS SENSOR) MISC Inject 1 Device into the skin See admin instructions. Place a new sensor into the skin every 15 days    [provider]  finasteride  (PROSCAR ) 5 MG tablet Take 1 tablet (5 mg total) by mouth daily. Patient taking differently: Take 5 mg by mouth at bedtime. 03/23/24     fluticasone -salmeterol (ADVAIR ) 250-50 MCG/ACT AEPB Inhale 1  puff into the lungs every 12 (twelve) hours. 05/03/24   Jude Harden GAILS, MD  Glucagon  (GVOKE HYPOPEN  2-PACK) 1 MG/0.2ML SOAJ Inject under the skin as directed for severe hypoglycemia. 06/17/22     Glucagon  (GVOKE HYPOPEN  2-PACK) 1 MG/0.2ML SOAJ Use as directed under the skin for severe hypoglycemia. 05/31/23     glucose blood (CONTOUR NEXT TEST) test strip USE 8 TIMES DAILY AS DIRECTED TO MONITOR BLOOD GLUCOSE 04/23/22     guaiFENesin  (MUCINEX ) 600 MG 12 hr tablet Take 2 tablets (1,200 mg total) by mouth 2 (two) times daily. 04/18/24   Ghimire, Kuber, MD  HYDROcodone -acetaminophen  (NORCO/VICODIN) 5-325 MG tablet Take 1 tablet by mouth 3 (three) times daily as needed 06/29/24     ibuprofen  (ADVIL ) 200 MG tablet Take 800 mg by mouth every 8 (eight) hours as needed (for pain).    [provider]  insulin  aspart (NOVOLOG  FLEXPEN) 100 UNIT/ML FlexPen Inject up to 100 units subcutaneously per [place] as directed (ICR 20, ISF 20, target 120) 04/02/24     insulin  glargine (LANTUS  SOLOSTAR) 100 UNIT/ML Solostar Pen Inject 25-30 Units into the skin daily as directed Patient taking differently: Inject 27 Units into the skin daily before breakfast. 10/20/23     Insulin  Pen Needle (TECHLITE PEN NEEDLES) 31G X 8 MM MISC Use 1 pen needle 4-5 times daily as directed. 11/30/22     Insulin  Pen Needle 31G X 8 MM MISC Use 1 pen needle subcutaneously 4 - 5 times daily as directed. 09/07/21     Insulin  Syringe-Needle U-100 31G X 5/16 0.3 ML MISC use to inject insulin  6 to 8 times daily 12/07/23   Nichole Senior, MD  ipratropium-albuterol  (DUONEB) 0.5-2.5 (3) MG/3ML SOLN Inhale 3 mLs into the lungs every 6 (six) hours as needed with nebulizer. 04/24/24     ipratropium-albuterol  (DUONEB) 0.5-2.5 (3) MG/3ML SOLN Inhale 1 vial (3 mLs) every 6 (six) hours. 04/25/24     levothyroxine  (SYNTHROID ) 100 MCG tablet Take 1 tablet (100 mcg total) by mouth daily. Patient taking differently: Take 100 mcg by mouth at bedtime. 03/22/24      metoCLOPramide  (REGLAN ) 5 MG tablet Take 1 tablet (5 mg total) by mouth 2 (two) times daily before a meal. 05/14/24     pantoprazole  (PROTONIX ) 40 MG tablet Take 1 tablet by mouth daily as directed. Patient taking differently: Take 40 mg by mouth at bedtime. 02/21/24     predniSONE  (DELTASONE ) 10 MG tablet Take 4 tabs  daily with food x 4 days, then 3 tabs daily x 4 days, then 2 tabs daily x 4 days, then 1 tab daily x4 days then stop. 05/02/24   Jude Harden GAILS, MD  promethazine  (PHENERGAN ) 25 MG tablet Take 1 tablet (25 mg  total) by mouth 3 (three) times daily as needed. 06/23/24     promethazine -dextromethorphan  (PROMETHAZINE -DM) 6.25-15 MG/5ML syrup Take 5 mLs by mouth 4 (four) times daily as needed for cough. 06/18/24   Charley Conger, PA-C  simvastatin  (ZOCOR ) 40 MG tablet Take 1 tablet (40 mg total) by mouth daily. Patient taking differently: Take 40 mg by mouth at bedtime. 07/27/23     sodium chloride  (OCEAN) 0.65 % SOLN nasal spray Place 1 spray into both nostrils as needed for congestion.    [provider]  tamsulosin  (FLOMAX ) 0.4 MG CAPS capsule Take 1 capsule (0.4 mg total) by mouth daily. Patient taking differently: Take 0.4 mg by mouth at bedtime. 03/20/24     traZODone  (DESYREL ) 150 MG tablet Take 1 tablet (150 mg total) by mouth at bedtime as needed for sleep Patient taking differently: Take 150 mg by mouth See admin instructions. Take 150 mg by mouth at bedtime 02/19/24       Physical Exam: Vitals:   07/18/24 1941 07/18/24 1942 07/18/24 1955  BP: 116/61    Pulse: (!) 108 (!) 108   Resp: (!) 22 (!) 22   Temp: 98.2 F (36.8 C)    SpO2: 95% 95%   Weight:   70.3 kg  Height:   5' 9 (1.753 m)   Constitutional: Resting in bed, NAD, calm, comfortable Eyes: EOMI, lids and conjunctivae normal ENMT: Mucous membranes are moist. Posterior pharynx clear of any exudate or lesions.poor dentition.  Neck: normal, supple, no masses. Respiratory: Distant breath sounds, largely clear to  auscultation bilaterally, no wheezing, no crackles. Normal respiratory effort while on 2 L O2 [place]. No accessory muscle use.  Cardiovascular: Regular rate and rhythm, no murmurs / rubs / gallops. No extremity edema. 2+ pedal pulses. Abdomen: no tenderness, no masses palpated. Musculoskeletal: no clubbing / cyanosis. No joint deformity upper and lower extremities. Good ROM, no contractures. Normal muscle tone.  Skin: no rashes, lesions, ulcers. No induration Neurologic: Sensation intact. Strength 5/5 in all 4.  Psychiatric: Normal judgment and insight. Alert and oriented x 3. Normal mood.   EKG: Personally reviewed. Sinus tachycardia, rate 110, no acute ischemic changes.  Similar to previous.  Assessment/Plan Principal Problem:   Sepsis due to pneumonia [facility]) Active Problems:   HLD (hyperlipidemia)   CAD (coronary artery disease)   Chronic asthmatic bronchitis (HCC)   DM type 1 (diabetes mellitus, type 1) (HCC)   Community acquired pneumonia of right upper lobe of lung   Benign prostatic hyperplasia with lower urinary tract symptoms   Hypothyroidism   COPD (chronic obstructive pulmonary disease) (HCC)   Hypertension associated with diabetes (HCC)   Chronic respiratory failure with hypoxia (HCC)   Shoichi Mielke Rish is a 68 y.o. male with medical history significant for COPD/chronic asthmatic bronchitis and bronchiectasis with chronic respiratory failure with hypoxia on 2 L O2 [place], CAD, T1DM, HTN, HLD, hypothyroidism, BPH, depression/anxiety, chronic pain who is admitted with RUL CAP failing outpatient antibiotics.  Assessment and Plan: Sepsis due to community acquired pneumonia of the right upper lobe of lung: Patient presenting with leukocytosis, tachycardia, tachypnea.  CXR shows worsening RUL pneumonia, failing outpatient course of Levaquin .  Currently saturating well on home 2 L O2 via [place].  He has history of aspiration risk since prior cervical spine fusion. - Broaden antibiotics to IV Zosyn ,  continue IV azithromycin  - Follow blood cultures, check sputum culture - Check strep pneumonia and Legionella urine antigens - Continue supplemental oxygen  - Continue IV fluid  hydration overnight - Incentive spirometer, flutter valve, Mucinex  - SLP eval  COPD/chronic asthmatic bronchitis/bronchiectasis: Does not appear to have acute COPD/asthma exacerbation but at risk with persistent pneumonia. - Brovana /Pulmicort  B, DuoNebs as needed - Continue 2 L of O2 via [place] - Continue antibiotics and pulmonary hygiene as above  Chronic respiratory failure with hypoxia: Currently stable on home 2 L supplemental O2 [place].  Type 1 diabetes: Since appetite is been slow will place on reduced basal Semglee  20 units daily and sensitive SSI.  Adjust as necessary.  Coronary artery disease: Continue aspirin  and simvastatin .  Hypertension: BP stable, does not appear to be on antihypertensives as an outpatient.  Hyperlipidemia: Continue simvastatin .  Hypothyroidism: Continue Synthroid .  Depression/anxiety: Continue trazodone .  BPH: Continue Flomax  and Proscar .  Chronic pain syndrome: Continue home Norco 5-325 mg 3 times daily prn.   DVT prophylaxis: enoxaparin  (LOVENOX ) injection 40 mg Start: 07/19/24 1000 Code Status: Full code, confirmed with patient on admission Family Communication: Spouse at bedside Disposition Plan: From home and likely discharge to home pending clinical progress Consults called: None Severity of Illness: The appropriate patient status for this patient is INPATIENT. Inpatient status is judged to be reasonable and necessary in order to provide the required intensity of service to ensure the patient's safety. The patient's presenting symptoms, physical exam findings, and initial radiographic and laboratory data in the context of their chronic comorbidities is felt to place them at high risk for further clinical deterioration. Furthermore, it is not anticipated that the  patient will be medically stable for discharge from the hospital within 2 midnights of admission.   * I certify that at the point of admission it is my clinical judgment that the patient will require inpatient hospital care spanning beyond 2 midnights from the point of admission due to high intensity of service, high risk for further deterioration and high frequency of surveillance required.DEWAINE Jorie Blanch MD [facility]  If 7PM-7AM, please contact night-coverage [email or web address]  07/18/2024, 10:54 PM      [1]  Social History Tobacco Use   Smoking status: Former    Current packs/[place]: 0.00    Average packs/[place]: 1 pack/[place] for 40.0 years (40.0 ttl pk-yrs)    Types: Cigarettes    Start date: 08/20/1979    Quit date: 08/20/2019    Years since quitting: 4.9    Passive exposure: Past   Smokeless tobacco: Never   Tobacco comments:    Successfully quit smoking 08/20/2019  Vaping Use   Vaping status: Never Used  Substance Use Topics   Alcohol  use: No   Drug use: No  [2]  Allergies Allergen Reactions   Augmentin  [Amoxicillin -Pot Clavulanate] Nausea And Vomiting   Ivp Dye [Iodinated Contrast Media] Other (See Comments)    Dizziness/sweating ALSO   Lexapro [Escitalopram Oxalate] Itching   "

## 2024-07-18 NOTE — ED Triage Notes (Signed)
 Shortness of breath for the past few days. Had pneumonia back in September and has needed supplemental oxygen  afterwards.   Support person in room reports poor oral intake, weight loss and generalized weakness.

## 2024-07-18 NOTE — Hospital Course (Signed)
 James [name] is a 68 y.o. male with medical history significant for COPD/chronic asthmatic bronchitis and bronchiectasis with chronic respiratory failure with hypoxia on 2 L O2 [place], CAD, T1DM, HTN, HLD, hypothyroidism, BPH, depression/anxiety, chronic pain who is admitted with RUL CAP failing outpatient antibiotics.

## 2024-07-18 NOTE — ED Provider Notes (Signed)
 " [place] [facility] AT [facility] Provider Note   CSN: [id number] Arrival date & time: 07/18/24  8077     Patient presents with: Shortness of Breath   James [name] is a 68 y.o. male.   68 year old male presents for evaluation of shortness of breath.  Since he had pneumonia few months ago has been on oxygen  at home.  Uses on 2 L has increased to 2.5 L.  Was recently diagnosed with another pneumonia earlier this month and has finished a course of Levaquin  but states over the last 4 days he has been more short of breath, coughing more and been more fatigued.  Denies any chest pain or fevers.  Denies any other symptoms or concerns.   Shortness of Breath Associated symptoms: no abdominal pain, no chest pain, no cough, no ear pain, no fever, no rash, no sore throat and no vomiting        Prior to Admission medications  Medication Sig Start Date End Date Taking? Authorizing Provider  acetaminophen  (TYLENOL ) 500 MG tablet Take 1,000 mg by mouth every 6 (six) hours as needed for mild pain or headache.   Yes [provider]  albuterol  (PROVENTIL ) (2.5 MG/3ML) 0.083% nebulizer solution Take 3 mLs by nebulization every 6 (six) hours as needed. 04/18/24  Yes Raenelle Coria, MD  albuterol  (VENTOLIN  HFA) 108 (90 Base) MCG/ACT inhaler Inhale 1 puff into the lungs every 4 (four) hours as needed. 06/12/24  Yes   aspirin  EC 81 MG tablet Take 1 tablet (81 mg total) by mouth daily. Swallow whole. Patient taking differently: Take 81 mg by mouth at bedtime. Swallow whole. 04/16/20  Yes Tolia, Sunit, DO  cetirizine (ZYRTEC) 10 MG tablet Take 10 mg by mouth at bedtime.   Yes [provider]  Continuous Blood Gluc Sensor (FREESTYLE LIBRE 3 SENSOR) MISC Change sensor every 14 days to monitor blood glucose continously 04/08/22  Yes   Continuous Glucose Sensor (FREESTYLE LIBRE 3 PLUS SENSOR) MISC Inject 1 Device into the skin See admin instructions. Place a new sensor into  the skin every 15 days   Yes [provider]  finasteride  (PROSCAR ) 5 MG tablet Take 1 tablet (5 mg total) by mouth daily. Patient taking differently: Take 5 mg by mouth at bedtime. 03/23/24  Yes   fluticasone -salmeterol (ADVAIR ) 250-50 MCG/ACT AEPB Inhale 1 puff into the lungs every 12 (twelve) hours. 05/03/24  Yes Jude Harden GAILS, MD  Glucagon  (GVOKE HYPOPEN  2-PACK) 1 MG/0.2ML SOAJ Inject under the skin as directed for severe hypoglycemia. 06/17/22  Yes   glucose blood (CONTOUR NEXT TEST) test strip USE 8 TIMES DAILY AS DIRECTED TO MONITOR BLOOD GLUCOSE 04/23/22  Yes   guaiFENesin  (MUCINEX ) 600 MG 12 hr tablet Take 2 tablets (1,200 mg total) by mouth 2 (two) times daily. 04/18/24  Yes Raenelle Coria, MD  HYDROcodone -acetaminophen  (NORCO/VICODIN) 5-325 MG tablet Take 1 tablet by mouth 3 (three) times daily as needed 06/29/24  Yes   ibuprofen  (ADVIL ) 200 MG tablet Take 800 mg by mouth every 8 (eight) hours as needed (for pain).   Yes [provider]  insulin  aspart (NOVOLOG  FLEXPEN) 100 UNIT/ML FlexPen Inject up to 100 units subcutaneously per Dutta as directed (ICR 20, ISF 20, target 120) 04/02/24  Yes   insulin  glargine (LANTUS  SOLOSTAR) 100 UNIT/ML Solostar Pen Inject 25-30 Units into the skin daily as directed Patient taking differently: Inject 27 Units into the skin daily before breakfast. 10/20/23  Yes   Insulin   Pen Needle (TECHLITE PEN NEEDLES) 31G X 8 MM MISC Use 1 pen needle 4-5 times daily as directed. 11/30/22  Yes   Insulin  Pen Needle 31G X 8 MM MISC Use 1 pen needle subcutaneously 4 - 5 times daily as directed. 09/07/21  Yes   Insulin  Syringe-Needle U-100 31G X 5/16 0.3 ML MISC use to inject insulin  6 to 8 times daily 12/07/23  Yes Nichole, Garnette, MD  ipratropium-albuterol  (DUONEB) 0.5-2.5 (3) MG/3ML SOLN Inhale 3 mLs into the lungs every 6 (six) hours as needed with nebulizer. 04/24/24  Yes   levofloxacin  (LEVAQUIN ) 750 MG tablet Take 1 tablet (750 mg total) by mouth  daily. Patient not taking: Reported on 07/18/2024 06/26/24   Charley Conger, PA-C  levothyroxine  (SYNTHROID ) 100 MCG tablet Take 1 tablet (100 mcg total) by mouth daily. Patient taking differently: Take 100 mcg by mouth at bedtime. 03/22/24  Yes   metoCLOPramide  (REGLAN ) 5 MG tablet Take 1 tablet (5 mg total) by mouth 2 (two) times daily before a meal. 05/14/24  Yes   pantoprazole  (PROTONIX ) 40 MG tablet Take 1 tablet by mouth daily as directed. Patient taking differently: Take 40 mg by mouth at bedtime. 02/21/24  Yes   promethazine  (PHENERGAN ) 25 MG tablet Take 1 tablet (25 mg total) by mouth 3 (three) times daily as needed. 06/23/24  Yes   promethazine -dextromethorphan  (PROMETHAZINE -DM) 6.25-15 MG/5ML syrup Take 5 mLs by mouth 4 (four) times daily as needed for cough. 06/18/24  Yes Charley Conger, PA-C  simvastatin  (ZOCOR ) 40 MG tablet Take 1 tablet (40 mg total) by mouth daily. Patient taking differently: Take 40 mg by mouth at bedtime. 07/27/23  Yes   tamsulosin  (FLOMAX ) 0.4 MG CAPS capsule Take 1 capsule (0.4 mg total) by mouth daily. Patient taking differently: Take 0.4 mg by mouth at bedtime. 03/20/24  Yes   traZODone  (DESYREL ) 150 MG tablet Take 1 tablet (150 mg total) by mouth at bedtime as needed for sleep Patient taking differently: Take 150 mg by mouth See admin instructions. Take 150 mg by mouth at bedtime 02/19/24  Yes   albuterol  (VENTOLIN  HFA) 108 (90 Base) MCG/ACT inhaler Inhale 2 puffs into the lungs every 6 (six) hours as needed for wheezing or shortness of breath. 07/03/24   Jude Harden GAILS, MD  Glucagon  (GVOKE HYPOPEN  2-PACK) 1 MG/0.2ML SOAJ Use as directed under the skin for severe hypoglycemia. Patient not taking: Reported on 07/18/2024 05/31/23     ipratropium-albuterol  (DUONEB) 0.5-2.5 (3) MG/3ML SOLN Inhale 1 vial (3 mLs) every 6 (six) hours. Patient not taking: Reported on 07/18/2024 04/25/24     predniSONE  (DELTASONE ) 10 MG tablet Take 4 tabs  daily with food x 4 days, then 3 tabs daily  x 4 days, then 2 tabs daily x 4 days, then 1 tab daily x4 days then stop. Patient not taking: Reported on 07/18/2024 05/02/24   Jude Harden GAILS, MD  sodium chloride  (OCEAN) 0.65 % SOLN nasal spray Place 1 spray into both nostrils as needed for congestion.    [provider]    Allergies: Augmentin  [amoxicillin -pot clavulanate], Ivp dye [iodinated contrast media], and Lexapro [escitalopram oxalate]    Review of Systems  Constitutional:  Negative for chills and fever.  HENT:  Negative for ear pain and sore throat.   Eyes:  Negative for pain and visual disturbance.  Respiratory:  Positive for shortness of breath. Negative for cough.   Cardiovascular:  Negative for chest pain and palpitations.  Gastrointestinal:  Negative for abdominal pain  and vomiting.  Genitourinary:  Negative for dysuria and hematuria.  Musculoskeletal:  Negative for arthralgias and back pain.  Skin:  Negative for color change and rash.  Neurological:  Negative for seizures and syncope.  All other systems reviewed and are negative.   Updated Vital Signs BP 116/61   Pulse (!) 108   Temp 98.2 F (36.8 C)   Resp (!) 22   Ht 5' 9 (1.753 m)   Wt 70.3 kg   SpO2 95%   BMI 22.89 kg/m   Physical Exam Vitals and nursing note reviewed.  Constitutional:      General: He is not in acute distress.    Appearance: He is well-developed. He is ill-appearing.  HENT:     Head: Normocephalic and atraumatic.  Eyes:     Conjunctiva/sclera: Conjunctivae normal.  Cardiovascular:     Rate and Rhythm: Regular rhythm. Tachycardia present.     Heart sounds: No murmur heard. Pulmonary:     Effort: Pulmonary effort is normal. No respiratory distress.     Breath sounds: Rhonchi present.  Abdominal:     Palpations: Abdomen is soft.     Tenderness: There is no abdominal tenderness.  Musculoskeletal:        General: No swelling.     Cervical back: Neck supple.  Skin:    General: Skin is warm and dry.     Capillary  Refill: Capillary refill takes less than 2 seconds.  Neurological:     Mental Status: He is alert.  Psychiatric:        Mood and Affect: Mood normal.     (all labs ordered are listed, but only abnormal results are displayed) Labs Reviewed  COMPREHENSIVE METABOLIC PANEL WITH GFR - Abnormal; Notable for the following components:      Result Value   Sodium 132 (*)    Chloride 94 (*)    Glucose, Bld 240 (*)    Total Protein 8.2 (*)    AST 12 (*)    Alkaline Phosphatase 171 (*)    All other components within normal limits  CBC WITH DIFFERENTIAL/PLATELET - Abnormal; Notable for the following components:   WBC 16.6 (*)    RBC 3.97 (*)    Hemoglobin 12.1 (*)    HCT 35.8 (*)    Platelets 489 (*)    Neutro Abs 14.4 (*)    All other components within normal limits  URINALYSIS, ROUTINE W REFLEX MICROSCOPIC - Abnormal; Notable for the following components:   Glucose, UA 50 (*)    Hgb urine dipstick SMALL (*)    Ketones, ur 20 (*)    Protein, ur 30 (*)    All other components within normal limits  CBG MONITORING, ED - Abnormal; Notable for the following components:   Glucose-Capillary 158 (*)    All other components within normal limits  RESP PANEL BY RT-PCR (RSV, FLU A&B, COVID)  RVPGX2  CULTURE, BLOOD (ROUTINE X 2)  CULTURE, BLOOD (ROUTINE X 2)  EXPECTORATED SPUTUM ASSESSMENT W GRAM STAIN, RFLX TO RESP C  PRO BRAIN NATRIURETIC PEPTIDE  PROTIME-INR  HIV ANTIBODY (ROUTINE TESTING W REFLEX)  STREP PNEUMONIAE URINARY ANTIGEN  LEGIONELLA PNEUMOPHILA SEROGP 1 UR AG  CBC  BASIC METABOLIC PANEL WITH GFR  I-STAT CG4 LACTIC ACID, ED  TROPONIN T, HIGH SENSITIVITY    EKG: EKG Interpretation Date/Time:  Wednesday July 18 2024 19:41:36 EST Ventricular Rate:  110 PR Interval:  153 QRS Duration:  90 QT Interval:  321 QTC Calculation:  435 R Axis:   94  Text Interpretation: Sinus tachycardia Right atrial enlargement Right axis deviation Compared with prior EKG from 04/10/2024  Confirmed by Gennaro Bouchard ([id number]) on 07/18/2024 8:01:29 PM  Radiology: [facility] Chest Port 1 View Result Date: 07/18/2024 EXAM: 1 VIEW(S) XRAY OF THE CHEST 07/18/2024 08:03:00 PM COMPARISON: 06/25/2024 CLINICAL HISTORY: Cough, Dyspnea, recent pneumonia. FINDINGS: LUNGS AND PLEURA: Progression of right upper lobe airspace opacity consistent with worsening pneumonia. Follow up to resolution recommended. No pleural effusion. No pneumothorax. HEART AND MEDIASTINUM: No acute abnormality of the cardiac and mediastinal silhouettes. BONES AND SOFT TISSUES: No acute osseous abnormality. IMPRESSION: 1. Worsening right upper lobe pneumonia. Follow-up to resolution recommended. Electronically signed by: Vanetta Chou MD 07/18/2024 08:41 PM EST RP Workstation: [id number]     .Critical Care  Performed by: Gennaro Bouchard CROME, DO Authorized by: Gennaro Bouchard CROME, DO   Critical care provider statement:    Critical care time (minutes):  30   Critical care time was exclusive of:  Separately billable procedures and treating other patients and teaching time   Critical care was necessary to treat or prevent imminent or life-threatening deterioration of the following conditions:  Respiratory failure and sepsis   Critical care was time spent personally by me on the following activities:  Development of treatment plan with patient or surrogate, discussions with consultants, evaluation of patient's response to treatment, examination of patient, ordering and review of laboratory studies, ordering and review of radiographic studies, ordering and performing treatments and interventions, pulse oximetry, re-evaluation of patient's condition and review of old charts   Care discussed with: admitting provider      Medications Ordered in the ED  enoxaparin  (LOVENOX ) injection 40 mg (has no administration in time range)  sodium chloride  flush (NS) 0.9 % injection 3 mL (has no administration in time range)  lactated ringers   infusion (has no administration in time range)  azithromycin  (ZITHROMAX ) 500 mg in sodium chloride  0.9 % 250 mL IVPB (has no administration in time range)  acetaminophen  (TYLENOL ) tablet 650 mg (has no administration in time range)    Or  acetaminophen  (TYLENOL ) suppository 650 mg (has no administration in time range)  ondansetron  (ZOFRAN ) tablet 4 mg (has no administration in time range)    Or  ondansetron  (ZOFRAN ) injection 4 mg (has no administration in time range)  senna-docusate (Senokot-S) tablet 1 tablet (has no administration in time range)  bisacodyl  (DULCOLAX) EC tablet 5 mg (has no administration in time range)  guaiFENesin  (MUCINEX ) 12 hr tablet 600 mg (has no administration in time range)  piperacillin -tazobactam (ZOSYN ) IVPB 3.375 g (has no administration in time range)  aspirin  EC tablet 81 mg (has no administration in time range)  arformoterol  (BROVANA ) nebulizer solution 15 mcg (has no administration in time range)  budesonide  (PULMICORT ) nebulizer solution 0.25 mg (has no administration in time range)  finasteride  (PROSCAR ) tablet 5 mg (has no administration in time range)  ipratropium-albuterol  (DUONEB) 0.5-2.5 (3) MG/3ML nebulizer solution 3 mL (has no administration in time range)  levothyroxine  (SYNTHROID ) tablet 100 mcg (has no administration in time range)  pantoprazole  (PROTONIX ) EC tablet 40 mg (has no administration in time range)  simvastatin  (ZOCOR ) tablet 40 mg (has no administration in time range)  tamsulosin  (FLOMAX ) capsule 0.4 mg (has no administration in time range)  traZODone  (DESYREL ) tablet 150 mg (has no administration in time range)  insulin  aspart (novoLOG ) injection 0-9 Units (has no administration in time range)  insulin  aspart (novoLOG ) injection 0-5 Units (  Subcutaneous Not Given 07/18/24 2304)  HYDROcodone -acetaminophen  (NORCO/VICODIN) 5-325 MG per tablet 1 tablet (has no administration in time range)  insulin  glargine (LANTUS ) injection 20 Units  (has no administration in time range)  ipratropium-albuterol  (DUONEB) 0.5-2.5 (3) MG/3ML nebulizer solution 3 mL (3 mLs Nebulization Given 07/18/24 2037)  cefTRIAXone  (ROCEPHIN ) 1 g in sodium chloride  0.9 % 100 mL IVPB (0 g Intravenous Stopped 07/18/24 2101)  azithromycin  (ZITHROMAX ) 500 mg in sodium chloride  0.9 % 250 mL IVPB (0 mg Intravenous Stopped 07/18/24 2223)  fentaNYL  (SUBLIMAZE ) injection 50 mcg (50 mcg Intravenous Given 07/18/24 2209)  ondansetron  (ZOFRAN ) injection 4 mg (4 mg Intravenous Given 07/18/24 2208)  lactated ringers  bolus 1,150 mL (1,150 mLs Intravenous New Bag/Given 07/18/24 2209)                                    Medical Decision Making Cardiac monitor interpretation: Sinus rhythm, no ectopy  Patient here for shortness of breath and found to have worsening pneumonia.  Was recently on Levaquin  without improvement in his symptoms.  Will start him on Rocephin  and azithromycin .  Given 30 cc/kg IV fluid bolus for sepsis as he is tachycardic, tachypneic and has a leukocytosis.  He was also given pain medication for back pain and nausea meds as well.  Discussed patient's case with hospitalist and patient will be admitted for further workup and management.  Patient evaluated and are agreeable with the plan.  Problems Addressed: Acute thoracic back pain, unspecified back pain laterality: acute illness or injury Community acquired pneumonia of right lung, unspecified part of lung: acute illness or injury that poses a threat to life or bodily functions Nausea: acute illness or injury Sepsis, due to unspecified organism, unspecified whether acute organ dysfunction present [facility]): acute illness or injury that poses a threat to life or bodily functions  Amount and/or Complexity of Data Reviewed External Data Reviewed: notes.    Details: Prior outpatient records reviewed and patient was diagnosed with pneumonia on 12-8 and started on Levaquin  Labs: ordered. Decision-making details  documented in ED Course.    Details: Done reviewed by me and patient does have a leukocytosis Radiology: ordered and independent interpretation performed. Decision-making details documented in ED Course.    Details: Ordered and interpreted by me independently of radiology Chest x-ray shows worsening right-sided infiltrate ECG/medicine tests: ordered and independent interpretation performed. Decision-making details documented in ED Course.    Details: Ordered and interpreted by me in the absence of cardiology and shows sinus tachycardia, no STEMI, or significant change when compared to prior EKG Discussion of management or test interpretation with external provider(s): Dr. York spoke with him on the phone regarding the patient's case and he will admit the patient for further workup and management  Risk OTC drugs. Prescription drug management. Drug therapy requiring intensive monitoring for toxicity. Decision regarding hospitalization. Risk Details: CRITICAL CARE Performed by: Duwaine LITTIE Fusi   Total critical care time: 30 minutes  Critical care time was exclusive of separately billable procedures and treating other patients.  Critical care was necessary to treat or prevent imminent or life-threatening deterioration.  Critical care was time spent personally by me on the following activities: development of treatment plan with patient and/or surrogate as well as nursing, discussions with consultants, evaluation of patient's response to treatment, examination of patient, obtaining history from patient or surrogate, ordering and performing treatments and interventions, ordering and review of laboratory studies,  ordering and review of radiographic studies, pulse oximetry and re-evaluation of patient's condition.   Critical Care Total time providing critical care: 30 minutes     Final diagnoses:  Sepsis, due to unspecified organism, unspecified whether acute organ  dysfunction present [facility])  Community acquired pneumonia of right lung, unspecified part of lung  Acute thoracic back pain, unspecified back pain laterality  Nausea    ED Discharge Orders     None          Gennaro Duwaine CROME, DO 07/18/24 2336  "

## 2024-07-18 NOTE — Progress Notes (Signed)
 Elink monitoring for the code sepsis protocol.

## 2024-07-18 NOTE — ED Provider Triage Note (Signed)
 Emergency Medicine Provider Triage Evaluation Note  James [name] , a 68 y.o. male  was evaluated in triage.  Pt complains of worsening shortness of breath, cough, congestion, generalized malaise and fatigue.  Patient also notes he has been experiencing back pain.  He was recently diagnosed with pneumonia a few weeks ago and was placed on Levaquin  with some improvement in his symptoms.  There has been no abdominal pain, nausea, vomiting, diarrhea..  Review of Systems  Positive: Shortness of breath, cough, congestion, generalized malaise and fatigue, back pain Negative: Abdominal pain, nausea, vomiting, diarrhea  Physical Exam  BP 116/61   Pulse (!) 108   Temp 98.2 F (36.8 C)   Resp (!) 22   SpO2 95%  Gen:   Awake, no distress   Resp:  Normal effort, wheezing and Rales MSK:   Moves extremities without difficulty  Other:    Medical Decision Making  Medically screening exam initiated at 7:54 PM.  Appropriate orders placed.  James [name] was informed that the remainder of the evaluation will be completed by another provider, this initial triage assessment does not replace that evaluation, and the importance of remaining in the ED until their evaluation is complete.  Sepsis workup initiated.  Will hold IV fluids at this point given the patient's rales on exam to ensure the patient is not fluid overloaded as well.  No peripheral edema noted.  Awaiting bed in the back.   Daralene Lonni BIRCH, PA-C 07/18/24 1955

## 2024-07-18 NOTE — Telephone Encounter (Signed)
 FYI Only or Action Required?: Action required by provider: request for appointment.  Patient was last seen in primary care on .  Called Nurse Triage reporting Cough.  Symptoms began several weeks ago.  Interventions attempted: Rest, hydration, or home remedies.  Symptoms are: gradually worsening. Productive cough worsening, tan mucus. Wheezing.   Triage Disposition: See Physician Within 24 Hours  Patient/caregiver understands and will follow disposition?: Yes E2C2 Pulmonary Triage - Initial Assessment Questions Chief Complaint (e.g., cough, sob, wheezing, fever, chills, sweat or additional symptoms) *Go to specific symptom protocol after initial questions. Productive cough with tan mucus  How long have symptoms been present? Several weeks, getting worse  Have you tested for COVID or Flu? Note: If not, ask patient if a home test can be taken. If so, instruct patient to call back for positive results. No  MEDICINES:   Have you used any OTC meds to help with symptoms? No If yes, ask What medications?   Have you used your inhalers/maintenance medication? Yes If yes, What medications?   If inhaler, ask How many puffs and how often? Note: Review instructions on medication in the chart.   OXYGEN : Do you wear supplemental oxygen ? Yes If yes, How many liters are you supposed to use? 2  Do you monitor your oxygen  levels? Yes If yes, What is your reading (oxygen  level) today? 90's  What is your usual oxygen  saturation reading?  (Note: Pulmonary O2 sats should be 90% or greater) 90's       Copied from CRM #[id number]. Topic: Clinical - Red Word Triage >> Jul 18, 2024  8:56 AM Russell PARAS wrote: Red Word that prompted transfer to Nurse Triage:   Ill intermittently for months w/pneumonia Still have chest congestion Productive cough, phlegm is tannish colored  Wheezing intermittently, rattling  Pt of [place]- has seen Charley on several  occasions([facility]) Reason for Disposition  [1] Continuous (nonstop) coughing interferes with work or school AND [2] no improvement using cough treatment per Care Advice  Answer Assessment - Initial Assessment Questions 1. ONSET: When did the cough begin?      Several weeks 2. SEVERITY: How bad is the cough today?      severe 3. SPUTUM: Describe the color of your sputum (e.g., none, dry cough; clear, white, yellow, green)     tan 4. HEMOPTYSIS: Are you coughing up any blood? If Yes, ask: How much? (e.g., flecks, streaks, tablespoons, etc.)     no 5. DIFFICULTY BREATHING: Are you having difficulty breathing? If Yes, ask: How bad is it? (e.g., mild, moderate, severe)      yes 6. FEVER: Do you have a fever? If Yes, ask: What is your temperature, how was it measured, and when did it start?     no 7. CARDIAC HISTORY: Do you have any history of heart disease? (e.g., heart attack, congestive heart failure)      yes 8. LUNG HISTORY: Do you have any history of lung disease?  (e.g., pulmonary embolus, asthma, emphysema)     yes 9. PE RISK FACTORS: Do you have a history of blood clots? (or: recent major surgery, recent prolonged travel, bedridden)     no 10. OTHER SYMPTOMS: Do you have any other symptoms? (e.g., runny nose, wheezing, chest pain)       wheezing 11. PREGNANCY: Is there any chance you are pregnant? When was your last menstrual period?       N/a 12. TRAVEL: Have you traveled out of the country in the last  month? (e.g., travel history, exposures)       no  Protocols used: Cough - Acute Productive-A-AH

## 2024-07-19 ENCOUNTER — Inpatient Hospital Stay (HOSPITAL_COMMUNITY)

## 2024-07-19 LAB — BASIC METABOLIC PANEL WITH GFR
Anion gap: 11 (ref 5–15)
BUN: 13 mg/dL (ref 8–23)
CO2: 25 mmol/L (ref 22–32)
Calcium: 8.8 mg/dL — ABNORMAL LOW (ref 8.9–10.3)
Chloride: 97 mmol/L — ABNORMAL LOW (ref 98–111)
Creatinine, Ser: 0.68 mg/dL (ref 0.61–1.24)
GFR, Estimated: >60 mL/min
Glucose, Bld: 149 mg/dL — ABNORMAL HIGH (ref 70–99)
Potassium: 4.3 mmol/L (ref 3.5–5.1)
Sodium: 132 mmol/L — ABNORMAL LOW (ref 135–145)

## 2024-07-19 LAB — CBC
HCT: 29.3 % — ABNORMAL LOW (ref 39.0–52.0)
Hemoglobin: 9.8 g/dL — ABNORMAL LOW (ref 13.0–17.0)
MCH: 30.3 pg (ref 26.0–34.0)
MCHC: 33.4 g/dL (ref 30.0–36.0)
MCV: 90.7 fL (ref 80.0–100.0)
Platelets: 371 K/uL (ref 150–400)
RBC: 3.23 MIL/uL — ABNORMAL LOW (ref 4.22–5.81)
RDW: 12.3 % (ref 11.5–15.5)
WBC: 13.4 K/uL — ABNORMAL HIGH (ref 4.0–10.5)
nRBC: 0 % (ref 0.0–0.2)

## 2024-07-19 LAB — GLUCOSE, CAPILLARY
Glucose-Capillary: 210 mg/dL — ABNORMAL HIGH (ref 70–99)
Glucose-Capillary: 154 mg/dL — ABNORMAL HIGH (ref 70–99)

## 2024-07-19 LAB — CBG MONITORING, ED
Glucose-Capillary: 113 mg/dL — ABNORMAL HIGH (ref 70–99)
Glucose-Capillary: 150 mg/dL — ABNORMAL HIGH (ref 70–99)

## 2024-07-19 LAB — EXPECTORATED SPUTUM ASSESSMENT W GRAM STAIN, RFLX TO RESP C

## 2024-07-19 LAB — STREP PNEUMONIAE URINARY ANTIGEN: Strep Pneumo Urinary Antigen: NEGATIVE

## 2024-07-19 LAB — MRSA NEXT GEN BY PCR, NASAL: MRSA by PCR Next Gen: NOT DETECTED

## 2024-07-19 LAB — HIV ANTIBODY (ROUTINE TESTING W REFLEX): HIV Screen 4th Generation wRfx: NONREACTIVE

## 2024-07-19 MED ORDER — IPRATROPIUM-ALBUTEROL 0.5-2.5 (3) MG/3ML IN SOLN
3.0000 mL | Freq: Four times a day (QID) | RESPIRATORY_TRACT | Status: DC
  Administered 2024-07-19 – 2024-07-21 (×7): 3 mL via RESPIRATORY_TRACT
  Filled 2024-07-19 (×7): qty 3

## 2024-07-19 MED ORDER — ENSURE PLUS HIGH PROTEIN PO LIQD
237.0000 mL | Freq: Two times a day (BID) | ORAL | Status: DC
  Administered 2024-07-20 – 2024-07-23 (×5): 237 mL via ORAL

## 2024-07-19 MED ORDER — MELATONIN 3 MG PO TABS: 3.0000 mg | ORAL_TABLET | Freq: Every evening | ORAL | Status: DC | PRN

## 2024-07-19 MED ORDER — SODIUM CHLORIDE 3 % IN NEBU
4.0000 mL | INHALATION_SOLUTION | Freq: Every day | RESPIRATORY_TRACT | Status: AC
  Administered 2024-07-19 – 2024-07-21 (×3): 4 mL via RESPIRATORY_TRACT
  Filled 2024-07-19 (×3): qty 4

## 2024-07-19 MED ORDER — ACETAMINOPHEN 500 MG PO TABS
1000.0000 mg | ORAL_TABLET | Freq: Three times a day (TID) | ORAL | Status: DC
  Filled 2024-07-19 (×3): qty 2

## 2024-07-19 MED ORDER — ORAL CARE MOUTH RINSE: 15.0000 mL | OROMUCOSAL | Status: DC | PRN

## 2024-07-19 MED ORDER — HYDROMORPHONE HCL 1 MG/ML IJ SOLN
0.5000 mg | INTRAMUSCULAR | Status: DC | PRN
  Administered 2024-07-20 – 2024-07-24 (×11): 0.5 mg via INTRAVENOUS
  Filled 2024-07-19 (×11): qty 0.5

## 2024-07-19 MED ORDER — HYDROCODONE-ACETAMINOPHEN 5-325 MG PO TABS
1.0000 | ORAL_TABLET | Freq: Three times a day (TID) | ORAL | Status: DC
  Administered 2024-07-19 – 2024-07-24 (×14): 1 via ORAL
  Filled 2024-07-19 (×15): qty 1

## 2024-07-19 NOTE — ED Notes (Signed)
 SLP finished at [facility]. Zosyn  started. Up to floor. Rates pain increasing, 7/10, back and chest.

## 2024-07-19 NOTE — Plan of Care (Signed)
  Problem: Fluid Volume: Goal: Ability to maintain a balanced intake and output will improve Outcome: Progressing   Problem: Metabolic: Goal: Ability to maintain appropriate glucose levels will improve Outcome: Progressing   Problem: Education: Goal: Knowledge of General Education information will improve Description: Including pain rating scale, medication(s)/side effects and non-pharmacologic comfort measures Outcome: Progressing   Problem: Clinical Measurements: Goal: Cardiovascular complication will be avoided Outcome: Progressing
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
## 2024-07-19 NOTE — Progress Notes (Addendum)
 " PROGRESS NOTE    James [name]  FMW:[id number] DOB: 06/24/1956 DOA: 07/18/2024 PCP: Nichole Senior, MD   Brief Narrative: 69 y.o. male with medical history significant for COPD/chronic asthmatic bronchitis and bronchiectasis with chronic respiratory failure with hypoxia on 2 L O2 [place], CAD, T1DM, HTN, HLD, hypothyroidism, BPH, depression/anxiety, chronic pain who presented to the ED for evaluation of shortness of breath.   Patient has a history of recurrent pneumonia as well as aspiration since prior cervical fusion.  He was last admitted September 2025 with acute hypoxic respiratory failure secondary to right-sided pneumonia and COPD exacerbation.  Since then he has been requiring 2 L supplemental O2 via [place] at baseline.  He had initial improvement for the first 2 weeks after discharge but since then has had progressively worsening dyspnea on exertion and cough.   He was seen in his pulmonology office earlier this month and diagnosed with right upper lobe pneumonia seen on chest x-ray 06/25/2024.  He completed in 1 week course of Levaquin .  He has not had any significant improvement.  He is still easily short of breath with minimal exertion.  He has frequent cough, mostly productive of clear sputum and occasionally sees follow-up green sputum.  He has been generally fatigued.  He reports frequent nausea without emesis.  Appetite has been low.  He reports approximately 35 pound weight loss since September.  He has occasional chills without fever or diaphoresis.  He has been having intermittent right upper chest pain, worse with deep inspiration.   ED Course  Labs/Imaging on admission: I have personally reviewed following labs and imaging studies.   Initial vitals showed BP 116/61, pulse 109, RR 22, temp 98.2 F, SpO2 95% on 2 L supplemental O2 via [place].   Labs showed WBC 16.6, hemoglobin 12.1, platelets 49, proBNP 190, sodium 132, potassium 4.5, bicarb 23, BUN 15, creatinine 0.80, serum glucose 240, lactic  acid 1.7.   Blood cultures in process.  SARS-CoV-2, influenza, RSV PCR negative.   Portable chest x-ray showed worsening right upper lobe pneumonia when compared to previous CXR 06/25/2024.   Patient was given 1150 mL LR bolus, IV ceftriaxone  and azithromycin , DuoNeb treatment.  The hospitalist service was consulted for admission.  Assessment & Plan:   Principal Problem:   Sepsis due to pneumonia [facility]) Active Problems:   HLD (hyperlipidemia)   CAD (coronary artery disease)   Chronic asthmatic bronchitis (HCC)   DM type 1 (diabetes mellitus, type 1) (HCC)   Community acquired pneumonia of right upper lobe of lung   Benign prostatic hyperplasia with lower urinary tract symptoms   Hypothyroidism   COPD (chronic obstructive pulmonary disease) (HCC)   Hypertension associated with diabetes (HCC)   Chronic respiratory failure with hypoxia (HCC)   Sepsis due to community acquired pneumonia of the right upper lobe of lung: Patient admitted with failed outpatient antibiotic treatment for pneumonia.  Since September when he was hospitalized he was discharged on oxygen  and remained on oxygen .  However he also remains short of breath with coughing.  He was treated with Levaquin  as an outpatient without improvement.  He was found to be tachycardic tachypneic with leukocytosis with endorgan damage such as right upper lobe pneumonia.  He has been started on Zosyn  and azithromycin  which we will continue.  Influenza RSV and COVID, strep pneumo antigen negative.  Encouraged him to use incentive spirometer.  He has no nausea or vomiting will DC IV fluids.  WBC trending down. CT chest 07/19/2024 Right  upper lobe patchy consolidative airspace opacities, most consistent with pneumonia. Upper and lower lobe mucous plugging, including within the distal right main bronchus. Recommend follow-up CT in 3 months to evaluate for complete resolution.Enlarged mediastinal lymph nodes, including a 1.3 cm precarinal lymph  node. No gross hilar adenopathy with limited evaluation on this noncontrast study.Nonobstructive bilateral nephrolithiasis. Will start him on hypertonic saline, encourage incentive spirometer and flutter valve, DuoNebs 4 times daily and chest vest therapy by RT.   COPD/chronic asthmatic bronchitis/bronchiectasis: Does not appear to have acute COPD/asthma exacerbation but at risk with persistent pneumonia.Brovana /Pulmicort  B, DuoNebs as needed Continue 2 L of O2 via [place] Continue antibiotics and pulmonary hygiene as above   Chronic respiratory failure with hypoxia: Currently stable on home 2 L supplemental O2 [place].   Type 1 diabetes:CBG (last 3)  Recent Labs    07/18/24 2302 07/19/24 0814  GLUCAP 158* 113*  On Semglee  20 units with sensitive SSI.  Coronary artery disease: Continue aspirin  and simvastatin .   Hypertension: BP stable, does not appear to be on antihypertensives as an outpatient.   Hyperlipidemia: Continue simvastatin .   Hypothyroidism: Continue Synthroid .   Depression/anxiety: Continue trazodone .   BPH: Continue Flomax  and Proscar .   Chronic pain syndrome: Continue home Norco 5-325 mg 3 times daily prn.   Estimated body mass index is 22.89 kg/m as calculated from the following:   Height as of this encounter: 5' 9 (1.753 m).   Weight as of this encounter: 70.3 kg.  DVT prophylaxis: lovenox  Code Status: full Family Communication: none Disposition Plan:  Status is: Inpatient   Consultants:  none  Procedures: none Antimicrobials: Anti-infectives (From admission, onward)    Start     Dose/Rate Route Frequency Ordered Stop   07/19/24 2200  azithromycin  (ZITHROMAX ) 500 mg in sodium chloride  0.9 % 250 mL IVPB        500 mg 250 mL/hr over 60 Minutes Intravenous Every 24 hours 07/18/24 2236 07/24/24 2159   07/19/24 2000  cefTRIAXone  (ROCEPHIN ) 2 g in sodium chloride  0.9 % 100 mL IVPB  Status:  Discontinued        2 g 200 mL/hr over 30 Minutes  Intravenous Every 24 hours 07/18/24 2236 07/18/24 2239   07/18/24 2300  piperacillin -tazobactam (ZOSYN ) IVPB 3.375 g        3.375 g 12.5 mL/hr over 240 Minutes Intravenous Every 8 hours 07/18/24 2246     07/18/24 2015  cefTRIAXone  (ROCEPHIN ) 2 g in sodium chloride  0.9 % 100 mL IVPB  Status:  Discontinued        2 g 200 mL/hr over 30 Minutes Intravenous Once 07/18/24 2011 07/18/24 2015   07/18/24 2015  azithromycin  (ZITHROMAX ) 500 mg in sodium chloride  0.9 % 250 mL IVPB  Status:  Discontinued        500 mg 250 mL/hr over 60 Minutes Intravenous  Once 07/18/24 2011 07/18/24 2015   07/18/24 2000  cefTRIAXone  (ROCEPHIN ) 1 g in sodium chloride  0.9 % 100 mL IVPB        1 g 200 mL/hr over 30 Minutes Intravenous  Once 07/18/24 1953 07/18/24 2101   07/18/24 2000  azithromycin  (ZITHROMAX ) 500 mg in sodium chloride  0.9 % 250 mL IVPB        500 mg 250 mL/hr over 60 Minutes Intravenous  Once 07/18/24 1953 07/18/24 2223        Subjective: Resting in bed reports she is feeling better than yesterday but continues to cough with shortness of breath Remains on 3  L of oxygen  saturating 97%  Objective: Vitals:   07/19/24 0715 07/19/24 0730 07/19/24 0800 07/19/24 0830  BP:  (!) 127/58 (!) 149/57 128/63  Pulse: 72 71 78 73  Resp:      Temp:      TempSrc:      SpO2: 98% 97% 97% 97%  Weight:      Height:        Intake/Output Summary (Last 24 hours) at 07/19/2024 0844 Last data filed at 07/19/2024 0837 Gross per 24 hour  Intake 2225.51 ml  Output --  Net 2225.51 ml   Filed Weights   07/18/24 1955  Weight: 70.3 kg    Examination:  General exam: Appears calm and comfortable  Respiratory system: Rhonchi right more than left. Respiratory effort normal. Cardiovascular system: Regular Gastrointestinal system: Abdomen is nondistended, soft and nontender. No organomegaly or masses felt. Normal bowel sounds heard. Central nervous system: Alert and oriented. No focal neurological  deficits. Extremities: No edema  Data Reviewed: I have personally reviewed following labs and imaging studies  CBC: Recent Labs  Lab 07/18/24 2030 07/19/24 0259  WBC 16.6* 13.4*  NEUTROABS 14.4*  --   HGB 12.1* 9.8*  HCT 35.8* 29.3*  MCV 90.2 90.7  PLT 489* 371   Basic Metabolic Panel: Recent Labs  Lab 07/18/24 2030 07/19/24 0259  NA 132* 132*  K 4.5 4.3  CL 94* 97*  CO2 23 25  GLUCOSE 240* 149*  BUN 15 13  CREATININE 0.80 0.68  CALCIUM 9.6 8.8*   GFR: Estimated Creatinine Clearance: 86.7 mL/min (by C-G formula based on SCr of 0.68 mg/dL). Liver Function Tests: Recent Labs  Lab 07/18/24 2030  AST 12*  ALT 6  ALKPHOS 171*  BILITOT 0.6  PROT 8.2*  ALBUMIN  3.6   No results for input(s): LIPASE, AMYLASE in the last 168 hours. No results for input(s): AMMONIA in the last 168 hours. Coagulation Profile: Recent Labs  Lab 07/18/24 2030  INR 1.1   Cardiac Enzymes: No results for input(s): CKTOTAL, CKMB, CKMBINDEX, TROPONINI in the last 168 hours. BNP (last 3 results) Recent Labs    07/18/24 2030  PROBNP 190.0   HbA1C: No results for input(s): HGBA1C in the last 72 hours. CBG: Recent Labs  Lab 07/18/24 2302 07/19/24 0814  GLUCAP 158* 113*   Lipid Profile: No results for input(s): CHOL, HDL, LDLCALC, TRIG, CHOLHDL, LDLDIRECT in the last 72 hours. Thyroid  Function Tests: No results for input(s): TSH, T4TOTAL, FREET4, T3FREE, THYROIDAB in the last 72 hours. Anemia Panel: No results for input(s): VITAMINB12, FOLATE, FERRITIN, TIBC, IRON, RETICCTPCT in the last 72 hours. Sepsis Labs: Recent Labs  Lab 07/18/24 2037  LATICACIDVEN 1.7    Recent Results (from the past 240 hours)  Culture, blood (routine x 2)     Status: None (Preliminary result)   Collection Time: 07/18/24  8:30 PM   Specimen: BLOOD LEFT ARM  Result Value Ref Range Status   Specimen Description   Final    BLOOD LEFT ARM Performed  at [facility], [address], [place], [place] [address]    Special Requests   Final    BOTTLES DRAWN AEROBIC AND ANAEROBIC Blood Culture adequate volume Performed at [facility], [address] [place], [place] [address]    Culture   Final    NO GROWTH < 12 HOURS Performed at [facility], [address] [place], [place] [address]    Report Status PENDING  Incomplete  Resp panel by RT-PCR (RSV, Flu A&B, Covid) Anterior Nasal Swab     Status: None   Collection Time: 07/18/24  8:37 PM   Specimen: Anterior Nasal Swab  Result Value Ref Range Status   SARS Coronavirus 2 by RT PCR NEGATIVE NEGATIVE Final    Comment: (NOTE) SARS-CoV-2 target nucleic acids are NOT DETECTED.  The SARS-CoV-2 RNA is generally detectable in upper respiratory specimens during the acute phase of infection. The lowest concentration of SARS-CoV-2 viral copies this assay can detect is 138 copies/mL. A negative result does not preclude SARS-Cov-2 infection and should not be used as the sole basis for treatment or other patient management decisions. A negative result may occur with  improper specimen collection/handling, submission of specimen other than nasopharyngeal swab, presence of viral mutation(s) within the areas targeted by this assay, and inadequate number of viral copies(<138 copies/mL). A negative result must be combined with clinical observations, patient history, and epidemiological information. The expected result is Negative.  Fact Sheet for Patients:  bloggercourse.com  Fact Sheet for Healthcare Providers:  seriousbroker.it  This test is no t yet approved or cleared by the [place]  FDA and  has been authorized for detection and/or diagnosis of SARS-CoV-2 by FDA under an Emergency Use Authorization (EUA). This EUA will remain  in effect (meaning this test can be used) for the duration of the COVID-19  declaration under Section 564(b)(1) of the Act, 21 U.S.C.section 360bbb-3(b)(1), unless the authorization is terminated  or revoked sooner.       Influenza A by PCR NEGATIVE NEGATIVE Final   Influenza B by PCR NEGATIVE NEGATIVE Final    Comment: (NOTE) The Xpert Xpress SARS-CoV-2/FLU/RSV plus assay is intended as an aid in the diagnosis of influenza from Nasopharyngeal swab specimens and should not be used as a sole basis for treatment. Nasal washings and aspirates are unacceptable for Xpert Xpress SARS-CoV-2/FLU/RSV testing.  Fact Sheet for Patients: bloggercourse.com  Fact Sheet for Healthcare Providers: seriousbroker.it  This test is not yet approved or cleared by the [place]  FDA and has been authorized for detection and/or diagnosis of SARS-CoV-2 by FDA under an Emergency Use Authorization (EUA). This EUA will remain in effect (meaning this test can be used) for the duration of the COVID-19 declaration under Section 564(b)(1) of the Act, 21 U.S.C. section 360bbb-3(b)(1), unless the authorization is terminated or revoked.     Resp Syncytial Virus by PCR NEGATIVE NEGATIVE Final    Comment: (NOTE) Fact Sheet for Patients: bloggercourse.com  Fact Sheet for Healthcare Providers: seriousbroker.it  This test is not yet approved or cleared by the [place]  FDA and has been authorized for detection and/or diagnosis of SARS-CoV-2 by FDA under an Emergency Use Authorization (EUA). This EUA will remain in effect (meaning this test can be used) for the duration of the COVID-19 declaration under Section 564(b)(1) of the Act, 21 U.S.C. section 360bbb-3(b)(1), unless the authorization is terminated or revoked.  Performed at [facility], [address] [place], [place] [address]          Radiology Studies: [facility] Chest Port 1 View Result Date:  07/18/2024 EXAM: 1 VIEW(S) XRAY OF THE CHEST 07/18/2024 08:03:00 PM COMPARISON: 06/25/2024 CLINICAL HISTORY: Cough, Dyspnea, recent pneumonia. FINDINGS: LUNGS AND PLEURA: Progression of right upper lobe airspace opacity consistent with worsening pneumonia. Follow up to resolution recommended. No pleural effusion. No pneumothorax. HEART AND MEDIASTINUM: No acute abnormality of the cardiac and mediastinal silhouettes. BONES AND SOFT TISSUES: No acute osseous  abnormality. IMPRESSION: 1. Worsening right upper lobe pneumonia. Follow-up to resolution recommended. Electronically signed by: Vanetta Chou MD 07/18/2024 08:41 PM EST RP Workstation: [id number]     Scheduled Meds:  arformoterol   15 mcg Nebulization BID   aspirin  EC  81 mg Oral QHS   budesonide  (PULMICORT ) nebulizer solution  0.25 mg Nebulization BID   enoxaparin  (LOVENOX ) injection  40 mg Subcutaneous Q24H   finasteride   5 mg Oral QHS   guaiFENesin   600 mg Oral BID   insulin  aspart  0-5 Units Subcutaneous QHS   insulin  aspart  0-9 Units Subcutaneous TID WC   insulin  glargine  20 Units Subcutaneous Daily   levothyroxine   100 mcg Oral QHS   pantoprazole   40 mg Oral QHS   simvastatin   40 mg Oral QHS   sodium chloride  flush  3 mL Intravenous Q12H   tamsulosin   0.4 mg Oral QHS   traZODone   150 mg Oral QHS   Continuous Infusions:  azithromycin      piperacillin -tazobactam (ZOSYN )  IV 3.375 g (07/19/24 0617)     LOS: 1 James [name]     James KANDICE Hoots, MD  07/19/2024, 8:44 AM   "

## 2024-07-20 DIAGNOSIS — A419 Sepsis, unspecified organism: Secondary | ICD-10-CM | POA: Diagnosis not present

## 2024-07-20 DIAGNOSIS — J189 Pneumonia, unspecified organism: Secondary | ICD-10-CM | POA: Diagnosis not present

## 2024-07-20 LAB — GLUCOSE, CAPILLARY
Glucose-Capillary: 109 mg/dL — ABNORMAL HIGH (ref 70–99)
Glucose-Capillary: 171 mg/dL — ABNORMAL HIGH (ref 70–99)
Glucose-Capillary: 155 mg/dL — ABNORMAL HIGH (ref 70–99)
Glucose-Capillary: 157 mg/dL — ABNORMAL HIGH (ref 70–99)
Glucose-Capillary: 368 mg/dL — ABNORMAL HIGH (ref 70–99)

## 2024-07-20 MED ORDER — GUAIFENESIN-DM 100-10 MG/5ML PO SYRP
5.0000 mL | ORAL_SOLUTION | ORAL | Status: DC | PRN
  Administered 2024-07-20 – 2024-07-21 (×3): 5 mL via ORAL
  Filled 2024-07-20 (×3): qty 10

## 2024-07-20 NOTE — Progress Notes (Signed)
 PT Cancellation Note  Patient Details Name: James [name] MRN: [id number] DOB: 05/20/1956   Cancelled Treatment:    Reason Eval/Treat Not Completed: Other (comment). PT arrived 1214 and another care provider present. PT to return as schedule allows. PT to continue to follow acutely.   [place], PT Acute Rehab   [place] VEAR Drone 07/20/2024, 3:03 PM

## 2024-07-20 NOTE — Evaluation (Signed)
 Occupational Therapy Evaluation Patient Details Name: James [name] Abt MRN: [id number] DOB: 10/28/1955 Today's Date: 07/20/2024   History of Present Illness   James [name] is a 69 y.o. male who presented to the ED for evaluation of shortness of breath. Recent recurrent pneumonia as well as aspiration since prior cervical fusion.  He was last admitted September 2025 with acute hypoxic respiratory failure secondary to right-sided pneumonia and COPD exacerbation.  Since then he has been requiring 2 L supplemental O2 via [place] at baseline.  He had initial improvement for the first 2 weeks after discharge but since then has had progressively worsening dyspnea on exertion and cough. Dx with sepsis with R upper lobe pneumonia seen on chest x-ray 06/25/2024.   PMH: COPD/chronic asthmatic bronchitis and bronchiectasis with chronic respiratory failure with hypoxia on 2 L O2 [place], CAD, T1DM, HTN, HLD, hypothyroidism, BPH, depression/anxiety, chronic pain     Clinical Impressions PTA, patient lives at home with wife and was indep without AD/DME for B/IADL's and mobility with 2 ltrs supplemental O2 at baseline. Currently, patient presents with deficits outlined below (see OT Problem List for details) most significantly pain (baseline back/neck), decreased activity tolerance for BADL's (relatively mod I) and functional mobility performance (Supervision for O2 consistency with amb lines/tubing). OT instructed in basic breathing integration, pacing, positioning and energy conservation principles and provided handout for carryover with recommendation for shower seat upon discharge. No other OT follow up post hospitalization anticipated. Patient requires continued Acute care hospital level OT services to progress safety and functional performance and allow for discharge.       If plan is discharge home, recommend the following:   A little help with walking and/or transfers;A little help with bathing/dressing/bathroom;Assist for  transportation;Help with stairs or ramp for entrance     Functional Status Assessment   Patient has had a recent decline in their functional status and demonstrates the ability to make significant improvements in function in a reasonable and predictable amount of time.     Equipment Recommendations   Tub/shower seat;Other (comment) (patient provided with handout for obtaining shower seat)      Precautions/Restrictions   Precautions Precautions: None Precaution/Restrictions Comments: watch O2 sats Restrictions Weight Bearing Restrictions Per Provider Order: No     Mobility Bed Mobility Overal bed mobility: Modified Independent                  Transfers Overall transfer level: Needs assistance Equipment used: None Transfers: Sit to/from Stand, Bed to chair/wheelchair/BSC Sit to Stand: Supervision     Step pivot transfers: Supervision     General transfer comment: min cues for pacing and tubing/lines mngt      Balance Overall balance assessment: No apparent balance deficits (not formally assessed)                                         ADL either performed or assessed with clinical judgement   ADL Overall ADL's : Modified independent                                       General ADL Comments: with cues for breathing, pacing and O2 consistency     Vision Baseline Vision/History: 0 No visual deficits  Pertinent Vitals/Pain Pain Assessment Pain Assessment: 0-10 Pain Location: back and neck Pain Descriptors / Indicators: Aching Pain Intervention(s): Monitored during session, Repositioned, Premedicated before session     Extremity/Trunk Assessment Upper Extremity Assessment Upper Extremity Assessment: Right hand dominant;Overall WFL for tasks assessed   Lower Extremity Assessment Lower Extremity Assessment: Defer to PT evaluation       Communication Communication Communication: No apparent  difficulties   Cognition Arousal: Alert Behavior During Therapy: WFL for tasks assessed/performed Cognition: No apparent impairments                               Following commands: Intact       Cueing  General Comments   Cueing Techniques: Verbal cues  Remained on 2 ltrs entire visit as his baseline O2 needs and sats 94-95% via [place], cues for breathing integ and pacing as well as ECT principles handout introduced   Exercises Exercises: Other exercises (reinforced Flutter 10 reps q hr and breathing integration)        Home Living Family/patient expects to be discharged to:: Private residence Living Arrangements: Spouse/significant other Available Help at Discharge: Family Type of Home: House Home Access: Stairs to enter Secretary/administrator of Steps: 2 Entrance Stairs-Rails: None Home Layout: One level     Bathroom Shower/Tub: Chief Strategy Officer: Standard     Home Equipment: None   Additional Comments: 2 ltrs O2 at baseline      Prior Functioning/Environment Prior Level of Function : Independent/Modified Independent;Driving             Mobility Comments: Independent with ambulation. ADLs Comments: Independent with ADLs, driving, and sharing the household cleaning with his spouse.    OT Problem List: Pain;Cardiopulmonary status limiting activity   OT Treatment/Interventions: Self-care/ADL training;Therapeutic exercise;Energy conservation;DME and/or AE instruction;Therapeutic activities;Patient/family education      OT Goals(Current goals can be found in the care plan section)   Acute Rehab OT Goals Patient Stated Goal: to feel better OT Goal Formulation: With patient Time For Goal Achievement: 08/03/24 Potential to Achieve Goals: Good ADL Goals Pt Will Transfer to Toilet: Independently;regular height toilet Pt Will Perform Toileting - Clothing Manipulation and hygiene: Independently;sit to/from stand Pt Will Perform  Tub/Shower Transfer: Tub transfer;ambulating;shower seat Pt/caregiver will Perform Home Exercise Program: Independently;With written HEP provided Additional ADL Goal #1: Patient will teach back 5/5 ECT strategies with B/IADL's and mobility indep   OT Frequency:  Min 2X/week       AM-PAC OT 6 Clicks Daily Activity     Outcome Measure Help from another person eating meals?: None Help from another person taking care of personal grooming?: None Help from another person toileting, which includes using toliet, bedpan, or urinal?: None Help from another person bathing (including washing, rinsing, drying)?: None Help from another person to put on and taking off regular upper body clothing?: None Help from another person to put on and taking off regular lower body clothing?: None 6 Click Score: 24   End of Session Equipment Utilized During Treatment: Gait belt;Oxygen  Nurse Communication: Mobility status  Activity Tolerance: Patient limited by pain Patient left: in bed;with call bell/phone within reach  OT Visit Diagnosis: Pain Pain - part of body:  (back and neck)                Time: 9153-9079 OT Time Calculation (min): 34 min Charges:  OT General Charges $OT Visit: 1 Visit OT  Evaluation $OT Eval Low Complexity: 1 Low OT Treatments $Therapeutic Activity: 8-22 mins Pieter Fooks OT/L Acute Rehabilitation Department  [phone]  07/20/2024, 9:36 AM

## 2024-07-20 NOTE — Progress Notes (Signed)
 " PROGRESS NOTE    James [name]  FMW:[id number] DOB: 10-Oct-1955 DOA: 07/18/2024 PCP: Nichole Senior, MD   Brief Narrative: 69 y.o. male with medical history significant for COPD/chronic asthmatic bronchitis and bronchiectasis with chronic respiratory failure with hypoxia on 2 L O2 [place], CAD, T1DM, HTN, HLD, hypothyroidism, BPH, depression/anxiety, chronic pain who presented to the ED for evaluation of shortness of breath.   Patient has a history of recurrent pneumonia as well as aspiration since prior cervical fusion.  He was last admitted September 2025 with acute hypoxic respiratory failure secondary to right-sided pneumonia and COPD exacerbation.  Since then he has been requiring 2 L supplemental O2 via [place] at baseline.  He had initial improvement for the first 2 weeks after discharge but since then has had progressively worsening dyspnea on exertion and cough.   He was seen in his pulmonology office earlier this month and diagnosed with right upper lobe pneumonia seen on chest x-ray 06/25/2024.  He completed in 1 week course of Levaquin .  He has not had any significant improvement.  He is still easily short of breath with minimal exertion.  He has frequent cough, mostly productive of clear sputum and occasionally sees follow-up green sputum.  He has been generally fatigued.  He reports frequent nausea without emesis.  Appetite has been low.  He reports approximately 35 pound weight loss since September.  He has occasional chills without fever or diaphoresis.  He has been having intermittent right upper chest pain, worse with deep inspiration.  Blood cultures in process.  SARS-CoV-2, influenza, RSV PCR negative.  Portable chest x-ray showed worsening right upper lobe pneumonia when compared to previous CXR 06/25/2024.  Assessment & Plan:   Principal Problem:   Sepsis due to pneumonia [facility]) Active Problems:   HLD (hyperlipidemia)   CAD (coronary artery disease)   Chronic asthmatic bronchitis (HCC)    DM type 1 (diabetes mellitus, type 1) (HCC)   Community acquired pneumonia of right upper lobe of lung   Benign prostatic hyperplasia with lower urinary tract symptoms   Hypothyroidism   COPD (chronic obstructive pulmonary disease) (HCC)   Hypertension associated with diabetes (HCC)   Chronic respiratory failure with hypoxia (HCC)   Sepsis due to community acquired pneumonia of the right upper lobe of lung: Patient admitted with failed outpatient antibiotic treatment for pneumonia.  Since September when he was hospitalized he was discharged on oxygen  and remained on oxygen .  However he also remains short of breath with coughing.  He was treated with Levaquin  as an outpatient without improvement.  He was found to be tachycardic tachypneic with leukocytosis with endorgan damage such as right upper lobe pneumonia.  He has been started on Zosyn  and azithromycin  which we will continue.  Influenza RSV and COVID, strep pneumo antigen negative.  Encouraged him to use incentive spirometer.  He has no nausea or vomiting will DC IV fluids.  WBC trending down. CT chest 07/19/2024 Right upper lobe patchy consolidative airspace opacities, most consistent with pneumonia. Upper and lower lobe mucous plugging, including within the distal right main bronchus. Recommend follow-up CT in 3 months to evaluate for complete resolution.Enlarged mediastinal lymph nodes, including a 1.3 cm precarinal lymph node. No gross hilar adenopathy with limited evaluation on this noncontrast study.Nonobstructive bilateral nephrolithiasis. Continue  hypertonic saline, encourage incentive spirometer and flutter valve, DuoNebs 4 times daily and chest vest therapy by RT.   COPD/chronic asthmatic bronchitis/bronchiectasis: Does not appear to have acute COPD/asthma exacerbation but at risk  with persistent pneumonia.Brovana /Pulmicort  B, DuoNebs as needed Continue 2 L of O2 via [place] Continue antibiotics and pulmonary hygiene as above   Chronic  respiratory failure with hypoxia: Currently stable on home 2 L supplemental O2 [place].   Type 1 diabetes:CBG (last 3)  Recent Labs    07/19/24 1644 07/19/24 2034 07/20/24 0734  GLUCAP 210* 154* 109*  On Semglee  20 units with sensitive SSI.  Coronary artery disease: Continue aspirin  and simvastatin .   Hypertension: BP stable, does not appear to be on antihypertensives as an outpatient.   Hyperlipidemia: Continue simvastatin .   Hypothyroidism: Continue Synthroid .   Depression/anxiety: Continue trazodone .   BPH: Continue Flomax  and Proscar .   Chronic pain syndrome: Continue home Norco 5-325 mg 3 times daily prn.   Estimated body mass index is 22.89 kg/m as calculated from the following:   Height as of this encounter: 5' 9 (1.753 m).   Weight as of this encounter: 70.3 kg.  DVT prophylaxis: lovenox  Code Status: full Family Communication: none Disposition Plan:  Status is: Inpatient   Consultants:  none  Procedures: none Antimicrobials: Anti-infectives (From admission, onward)    Start     Dose/Rate Route Frequency Ordered Stop   07/19/24 2200  azithromycin  (ZITHROMAX ) 500 mg in sodium chloride  0.9 % 250 mL IVPB        500 mg 250 mL/hr over 60 Minutes Intravenous Every 24 hours 07/18/24 2236 07/24/24 2159   07/19/24 2000  cefTRIAXone  (ROCEPHIN ) 2 g in sodium chloride  0.9 % 100 mL IVPB  Status:  Discontinued        2 g 200 mL/hr over 30 Minutes Intravenous Every 24 hours 07/18/24 2236 07/18/24 2239   07/18/24 2300  piperacillin -tazobactam (ZOSYN ) IVPB 3.375 g        3.375 g 12.5 mL/hr over 240 Minutes Intravenous Every 8 hours 07/18/24 2246     07/18/24 2015  cefTRIAXone  (ROCEPHIN ) 2 g in sodium chloride  0.9 % 100 mL IVPB  Status:  Discontinued        2 g 200 mL/hr over 30 Minutes Intravenous Once 07/18/24 2011 07/18/24 2015   07/18/24 2015  azithromycin  (ZITHROMAX ) 500 mg in sodium chloride  0.9 % 250 mL IVPB  Status:  Discontinued        500 mg 250 mL/hr over  60 Minutes Intravenous  Once 07/18/24 2011 07/18/24 2015   07/18/24 2000  cefTRIAXone  (ROCEPHIN ) 1 g in sodium chloride  0.9 % 100 mL IVPB        1 g 200 mL/hr over 30 Minutes Intravenous  Once 07/18/24 1953 07/18/24 2101   07/18/24 2000  azithromycin  (ZITHROMAX ) 500 mg in sodium chloride  0.9 % 250 mL IVPB        500 mg 250 mL/hr over 60 Minutes Intravenous  Once 07/18/24 1953 07/18/24 2223        Subjective:  Feeling a little better than yesterday still short of breath and coughing started to bring up some phlegm since starting hypertonic saline and chest PT. Encouraged him to get out of bed and ambulate. Objective: Vitals:   07/19/24 2234 07/20/24 0229 07/20/24 0629 07/20/24 0901  BP: (!) 130/59 (!) 127/56 129/76   Pulse: 90 74 87   Resp:      Temp: 99.6 F (37.6 C) 98.3 F (36.8 C) 97.6 F (36.4 C)   TempSrc: Oral Oral    SpO2: 93% 92% 91% 94%  Weight:      Height:        Intake/Output Summary (Last 24  hours) at 07/20/2024 1009 Last data filed at 07/20/2024 0844 Gross per 24 hour  Intake 410.14 ml  Output 400 ml  Net 10.14 ml   Filed Weights   07/18/24 1955  Weight: 70.3 kg    Examination:  General exam: Appears calm and comfortable  Respiratory system: Rhonchi right more than left. Respiratory effort normal. Cardiovascular system: Regular Gastrointestinal system: Abdomen is nondistended, soft and nontender. No organomegaly or masses felt. Normal bowel sounds heard. Central nervous system: Alert and oriented. No focal neurological deficits. Extremities: No edema  Data Reviewed: I have personally reviewed following labs and imaging studies  CBC: Recent Labs  Lab 07/18/24 2030 07/19/24 0259  WBC 16.6* 13.4*  NEUTROABS 14.4*  --   HGB 12.1* 9.8*  HCT 35.8* 29.3*  MCV 90.2 90.7  PLT 489* 371   Basic Metabolic Panel: Recent Labs  Lab 07/18/24 2030 07/19/24 0259  NA 132* 132*  K 4.5 4.3  CL 94* 97*  CO2 23 25  GLUCOSE 240* 149*  BUN 15 13   CREATININE 0.80 0.68  CALCIUM 9.6 8.8*   GFR: Estimated Creatinine Clearance: 86.7 mL/min (by C-G formula based on SCr of 0.68 mg/dL). Liver Function Tests: Recent Labs  Lab 07/18/24 2030  AST 12*  ALT 6  ALKPHOS 171*  BILITOT 0.6  PROT 8.2*  ALBUMIN  3.6   No results for input(s): LIPASE, AMYLASE in the last 168 hours. No results for input(s): AMMONIA in the last 168 hours. Coagulation Profile: Recent Labs  Lab 07/18/24 2030  INR 1.1   Cardiac Enzymes: No results for input(s): CKTOTAL, CKMB, CKMBINDEX, TROPONINI in the last 168 hours. BNP (last 3 results) Recent Labs    07/18/24 2030  PROBNP 190.0   HbA1C: No results for input(s): HGBA1C in the last 72 hours. CBG: Recent Labs  Lab 07/19/24 0814 07/19/24 1152 07/19/24 1644 07/19/24 2034 07/20/24 0734  GLUCAP 113* 150* 210* 154* 109*   Lipid Profile: No results for input(s): CHOL, HDL, LDLCALC, TRIG, CHOLHDL, LDLDIRECT in the last 72 hours. Thyroid  Function Tests: No results for input(s): TSH, T4TOTAL, FREET4, T3FREE, THYROIDAB in the last 72 hours. Anemia Panel: No results for input(s): VITAMINB12, FOLATE, FERRITIN, TIBC, IRON, RETICCTPCT in the last 72 hours. Sepsis Labs: Recent Labs  Lab 07/18/24 2037  LATICACIDVEN 1.7    Recent Results (from the past 240 hours)  Culture, blood (routine x 2)     Status: None (Preliminary result)   Collection Time: 07/18/24  8:30 PM   Specimen: BLOOD LEFT ARM  Result Value Ref Range Status   Specimen Description   Final    BLOOD LEFT ARM Performed at [facility], [address] [place], [place] [address]    Special Requests   Final    BOTTLES DRAWN AEROBIC AND ANAEROBIC Blood Culture adequate volume Performed at [facility], [address], [place], [place] [address]    Culture   Final    NO GROWTH 1 [place] Performed at [facility], [address], [place], [place] [address]     Report Status PENDING  Incomplete  Resp panel by RT-PCR (RSV, Flu A&B, Covid) Anterior Nasal Swab     Status: None   Collection Time: 07/18/24  8:37 PM   Specimen: Anterior Nasal Swab  Result Value Ref Range Status   SARS Coronavirus 2 by RT PCR NEGATIVE NEGATIVE Final    Comment: (NOTE) SARS-CoV-2 target nucleic acids are NOT DETECTED.  The SARS-CoV-2 RNA is  generally detectable in upper respiratory specimens during the acute phase of infection. The lowest concentration of SARS-CoV-2 viral copies this assay can detect is 138 copies/mL. A negative result does not preclude SARS-Cov-2 infection and should not be used as the sole basis for treatment or other patient management decisions. A negative result may occur with  improper specimen collection/handling, submission of specimen other than nasopharyngeal swab, presence of viral mutation(s) within the areas targeted by this assay, and inadequate number of viral copies(<138 copies/mL). A negative result must be combined with clinical observations, patient history, and epidemiological information. The expected result is Negative.  Fact Sheet for Patients:  bloggercourse.com  Fact Sheet for Healthcare Providers:  seriousbroker.it  This test is no t yet approved or cleared by the [place]  FDA and  has been authorized for detection and/or diagnosis of SARS-CoV-2 by FDA under an Emergency Use Authorization (EUA). This EUA will remain  in effect (meaning this test can be used) for the duration of the COVID-19 declaration under Section 564(b)(1) of the Act, 21 U.S.C.section 360bbb-3(b)(1), unless the authorization is terminated  or revoked sooner.       Influenza A by PCR NEGATIVE NEGATIVE Final   Influenza B by PCR NEGATIVE NEGATIVE Final    Comment: (NOTE) The Xpert Xpress SARS-CoV-2/FLU/RSV plus assay is intended as an aid in the diagnosis of influenza from Nasopharyngeal swab  specimens and should not be used as a sole basis for treatment. Nasal washings and aspirates are unacceptable for Xpert Xpress SARS-CoV-2/FLU/RSV testing.  Fact Sheet for Patients: bloggercourse.com  Fact Sheet for Healthcare Providers: seriousbroker.it  This test is not yet approved or cleared by the [place]  FDA and has been authorized for detection and/or diagnosis of SARS-CoV-2 by FDA under an Emergency Use Authorization (EUA). This EUA will remain in effect (meaning this test can be used) for the duration of the COVID-19 declaration under Section 564(b)(1) of the Act, 21 U.S.C. section 360bbb-3(b)(1), unless the authorization is terminated or revoked.     Resp Syncytial Virus by PCR NEGATIVE NEGATIVE Final    Comment: (NOTE) Fact Sheet for Patients: bloggercourse.com  Fact Sheet for Healthcare Providers: seriousbroker.it  This test is not yet approved or cleared by the [place]  FDA and has been authorized for detection and/or diagnosis of SARS-CoV-2 by FDA under an Emergency Use Authorization (EUA). This EUA will remain in effect (meaning this test can be used) for the duration of the COVID-19 declaration under Section 564(b)(1) of the Act, 21 U.S.C. section 360bbb-3(b)(1), unless the authorization is terminated or revoked.  Performed at [facility], [address], [place], [place] [address]   Expectorated Sputum Assessment w Gram Stain, Rflx to Resp Cult     Status: None   Collection Time: 07/19/24  2:00 PM   Specimen: Expectorated Sputum  Result Value Ref Range Status   Specimen Description EXPECTORATED SPUTUM  Final   Special Requests NONE  Final   Sputum evaluation   Final    THIS SPECIMEN IS ACCEPTABLE FOR SPUTUM CULTURE Performed at [facility], [address] [place], [place] [address]    Report Status  07/19/2024 FINAL  Final  Culture, Respiratory w Gram Stain     Status: None (Preliminary result)   Collection Time: 07/19/24  2:00 PM  Result Value Ref Range Status   Specimen Description   Final    EXPECTORATED SPUTUM Performed at [facility], [address], [place], [place] [address]  Special Requests   Final    NONE Reflexed from [id number] Performed at [facility], [address] [place], [place] [address]    Gram Stain   Final    ABUNDANT WBC PRESENT, PREDOMINANTLY PMN MODERATE GRAM POSITIVE COCCI RARE GRAM POSITIVE RODS Performed at [facility], [address], [place], [place] [address]    Culture PENDING  Incomplete   Report Status PENDING  Incomplete  MRSA Next Gen by PCR, Nasal     Status: None   Collection Time: 07/19/24  3:00 PM   Specimen: Nasal Mucosa; Nasal Swab  Result Value Ref Range Status   MRSA by PCR Next Gen NOT DETECTED NOT DETECTED Final    Comment: (NOTE) The GeneXpert MRSA Assay (FDA approved for NASAL specimens only), is one component of a comprehensive MRSA colonization surveillance program. It is not intended to diagnose MRSA infection nor to guide or monitor treatment for MRSA infections. Test performance is not FDA approved in patients less than 58 years old. Performed at [facility], [address] [place], [place] [address]   Culture, blood (routine x 2)     Status: None (Preliminary result)   Collection Time: 07/19/24  3:22 PM   Specimen: BLOOD RIGHT ARM  Result Value Ref Range Status   Specimen Description   Final    BLOOD RIGHT ARM Performed at [facility], [address], [place], [place] [address]    Special Requests   Final    BOTTLES DRAWN AEROBIC ONLY Blood Culture adequate volume Performed at [facility], [address], [place], [place] [address]    Culture   Final    NO GROWTH < 12 HOURS Performed at [facility], [address], [place], [place] [address]    Report Status PENDING  Incomplete         Radiology Studies: CT CHEST WO CONTRAST Result Date: 07/19/2024 EXAM: CT CHEST WITHOUT CONTRAST 07/19/2024 09:55:19 AM TECHNIQUE: CT of the chest was performed without the administration of intravenous contrast. Multiplanar reformatted images are provided for review. Automated exposure control, iterative reconstruction, and/or weight based adjustment of the mA/kV was utilized to reduce the radiation dose to as low as reasonably achievable. COMPARISON: None available. CLINICAL HISTORY: Pneumonia, complication suspected, xray done; recurrent RUL pneumonia. FINDINGS: MEDIASTINUM: Heart: At least 3-vessel coronary artery calcifications. Aortic valve leaflet calcification. Pericardium is unremarkable. The central airways demonstrate mucous plugging noted within the distal bronchus. Mild-to-moderate atherosclerotic plaque. LYMPH NODES: Enlarged mediastinal lymph nodes, including a 1.3 cm precarinal lymph node. No gross hilar adenopathy with markedly limited evaluation on this noncontrast study and associated adjacent lung disease. No axillary lymphadenopathy. LUNGS AND PLEURA: Right upper lobe patchy consolidative airspace opacities. Upper and lower lobe mucous plugging. No pulmonary edema. No pleural effusion or pneumothorax. SOFT TISSUES/BONES: Old healed bilateral posterior rib fractures. No acute abnormality of the bones or soft tissues. UPPER ABDOMEN: Limited images of the upper abdomen demonstrate a tiny hiatal hernia. Bilateral nephrolithiasis with a 7 mm left stone and punctate right stones noted. IMPRESSION: 1. Right upper lobe patchy consolidative airspace opacities, most consistent with pneumonia. Upper and lower lobe mucous plugging, including within the distal right main bronchus. Recommend follow-up CT in 3 months to evaluate for complete resolution. 2. Enlarged mediastinal lymph nodes, including a 1.3 cm  precarinal lymph node. No gross hilar adenopathy with limited evaluation on this noncontrast study. 3. Nonobstructive bilateral nephrolithiasis. Electronically signed  by: Morgane Naveau MD 07/19/2024 11:15 AM EST RP Workstation: [id number]   DG Chest Port 1 View Result Date: 07/18/2024 EXAM: 1 VIEW(S) XRAY OF THE CHEST 07/18/2024 08:03:00 PM COMPARISON: 06/25/2024 CLINICAL HISTORY: Cough, Dyspnea, recent pneumonia. FINDINGS: LUNGS AND PLEURA: Progression of right upper lobe airspace opacity consistent with worsening pneumonia. Follow up to resolution recommended. No pleural effusion. No pneumothorax. HEART AND MEDIASTINUM: No acute abnormality of the cardiac and mediastinal silhouettes. BONES AND SOFT TISSUES: No acute osseous abnormality. IMPRESSION: 1. Worsening right upper lobe pneumonia. Follow-up to resolution recommended. Electronically signed by: Arash Radparvar MD 07/18/2024 08:41 PM EST RP Workstation: [id number]     Scheduled Meds:  acetaminophen   1,000 mg Oral TID   arformoterol   15 mcg Nebulization BID   aspirin  EC  81 mg Oral QHS   budesonide  (PULMICORT ) nebulizer solution  0.25 mg Nebulization BID   enoxaparin  (LOVENOX ) injection  40 mg Subcutaneous Q24H   feeding supplement  237 mL Oral BID BM   finasteride   5 mg Oral QHS   guaiFENesin   600 mg Oral BID   HYDROcodone -acetaminophen   1 tablet Oral TID   insulin  aspart  0-5 Units Subcutaneous QHS   insulin  aspart  0-9 Units Subcutaneous TID WC   insulin  glargine  20 Units Subcutaneous Daily   ipratropium-albuterol   3 mL Nebulization QID   levothyroxine   100 mcg Oral QHS   pantoprazole   40 mg Oral QHS   simvastatin   40 mg Oral QHS   sodium chloride  flush  3 mL Intravenous Q12H   sodium chloride  HYPERTONIC  4 mL Nebulization Daily   tamsulosin   0.4 mg Oral QHS   traZODone   150 mg Oral QHS   Continuous Infusions:  azithromycin  Stopped (07/19/24 2245)   piperacillin -tazobactam (ZOSYN )  IV 3.375 g (07/20/24 0611)     LOS: 2  days     Almarie KANDICE Hoots, MD  07/20/2024, 10:09 AM   "
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
## 2024-07-21 DIAGNOSIS — J189 Pneumonia, unspecified organism: Secondary | ICD-10-CM | POA: Diagnosis not present

## 2024-07-21 DIAGNOSIS — A419 Sepsis, unspecified organism: Secondary | ICD-10-CM | POA: Diagnosis not present

## 2024-07-21 LAB — LEGIONELLA PNEUMOPHILA SEROGP 1 UR AG: L. pneumophila Serogp 1 Ur Ag: NEGATIVE

## 2024-07-21 LAB — GLUCOSE, CAPILLARY
Glucose-Capillary: 368 mg/dL — ABNORMAL HIGH (ref 70–99)
Glucose-Capillary: 255 mg/dL — ABNORMAL HIGH (ref 70–99)
Glucose-Capillary: 233 mg/dL — ABNORMAL HIGH (ref 70–99)
Glucose-Capillary: 133 mg/dL — ABNORMAL HIGH (ref 70–99)
Glucose-Capillary: 217 mg/dL — ABNORMAL HIGH (ref 70–99)

## 2024-07-21 MED ORDER — INSULIN GLARGINE 100 UNIT/ML ~~LOC~~ SOLN
26.0000 [IU] | Freq: Every day | SUBCUTANEOUS | Status: DC
  Filled 2024-07-21: qty 0.26

## 2024-07-21 MED ORDER — INSULIN GLARGINE 100 UNIT/ML ~~LOC~~ SOLN
26.0000 [IU] | Freq: Every day | SUBCUTANEOUS | Status: DC
  Administered 2024-07-22 – 2024-07-24 (×3): 26 [IU] via SUBCUTANEOUS
  Filled 2024-07-21 (×3): qty 0.26

## 2024-07-21 MED ORDER — INSULIN ASPART 100 UNIT/ML IJ SOLN
6.0000 [IU] | Freq: Three times a day (TID) | INTRAMUSCULAR | Status: DC
  Administered 2024-07-21 – 2024-07-22 (×2): 6 [IU] via SUBCUTANEOUS
  Administered 2024-07-23: 3 [IU] via SUBCUTANEOUS
  Administered 2024-07-24: 6 [IU] via SUBCUTANEOUS
  Filled 2024-07-21 (×7): qty 6

## 2024-07-21 MED ORDER — IPRATROPIUM-ALBUTEROL 0.5-2.5 (3) MG/3ML IN SOLN
3.0000 mL | Freq: Three times a day (TID) | RESPIRATORY_TRACT | Status: DC
  Administered 2024-07-21 – 2024-07-24 (×9): 3 mL via RESPIRATORY_TRACT
  Filled 2024-07-21 (×9): qty 3

## 2024-07-21 MED ORDER — SODIUM CHLORIDE 0.9 % IV SOLN
12.5000 mg | Freq: Four times a day (QID) | INTRAVENOUS | Status: DC | PRN
  Administered 2024-07-22 (×2): 12.5 mg via INTRAVENOUS
  Filled 2024-07-21 (×2): qty 12.5
  Filled 2024-07-21: qty 0.5

## 2024-07-21 MED ORDER — INSULIN GLARGINE 100 UNIT/ML ~~LOC~~ SOLN
6.0000 [IU] | SUBCUTANEOUS | Status: AC
  Administered 2024-07-21: 6 [IU] via SUBCUTANEOUS
  Filled 2024-07-21: qty 0.06

## 2024-07-21 MED ORDER — INSULIN ASPART 100 UNIT/ML IJ SOLN
5.0000 [IU] | Freq: Once | INTRAMUSCULAR | Status: AC
  Administered 2024-07-21: 5 [IU] via SUBCUTANEOUS
  Filled 2024-07-21: qty 5

## 2024-07-21 NOTE — Progress Notes (Signed)
 " PROGRESS NOTE    Codi Kertz [place]  FMW:[id number] DOB: 1956/03/30 DOA: 07/18/2024 PCP: Nichole Senior, MD   Brief Narrative: 69 y.o. male with medical history significant for COPD/chronic asthmatic bronchitis and bronchiectasis with chronic respiratory failure with hypoxia on 2 L O2 [place], CAD, T1DM, HTN, HLD, hypothyroidism, BPH, depression/anxiety, chronic pain who presented to the ED for evaluation of shortness of breath.   Patient has a history of recurrent pneumonia as well as aspiration since prior cervical fusion.  He was last admitted September 2025 with acute hypoxic respiratory failure secondary to right-sided pneumonia and COPD exacerbation.  Since then he has been requiring 2 L supplemental O2 via [place] at baseline.  He had initial improvement for the first 2 weeks after discharge but since then has had progressively worsening dyspnea on exertion and cough.   He was seen in his pulmonology office earlier this month and diagnosed with right upper lobe pneumonia seen on chest x-ray 06/25/2024.  He completed in 1 week course of Levaquin .  He has not had any significant improvement.  He is still easily short of breath with minimal exertion.  He has frequent cough, mostly productive of clear sputum and occasionally sees follow-up green sputum.  He has been generally fatigued.  He reports frequent nausea without emesis.  Appetite has been low.  He reports approximately 35 pound weight loss since September.  He has occasional chills without fever or diaphoresis.  He has been having intermittent right upper chest pain, worse with deep inspiration.  Blood cultures in process.  SARS-CoV-2, influenza, RSV PCR negative.  Portable chest x-ray showed worsening right upper lobe pneumonia when compared to previous CXR 06/25/2024.  Assessment & Plan:   Principal Problem:   Sepsis due to pneumonia [facility]) Active Problems:   HLD (hyperlipidemia)   CAD (coronary artery disease)   Chronic asthmatic bronchitis (HCC)    DM type 1 (diabetes mellitus, type 1) (HCC)   Community acquired pneumonia of right upper lobe of lung   Benign prostatic hyperplasia with lower urinary tract symptoms   Hypothyroidism   COPD (chronic obstructive pulmonary disease) (HCC)   Hypertension associated with diabetes (HCC)   Chronic respiratory failure with hypoxia (HCC)   Sepsis due to community acquired pneumonia of the right upper lobe of lung: Patient admitted with failed outpatient antibiotic treatment for pneumonia.  Since September when he was hospitalized he was discharged on oxygen  and remained on oxygen .  However he also remains short of breath with coughing.  He was treated with Levaquin  as an outpatient without improvement.  He was found to be tachycardic tachypneic with leukocytosis with endorgan damage such as right upper lobe pneumonia.  He has been started on Zosyn  and azithromycin  which we will continue.  Influenza RSV and COVID, strep pneumo antigen negative.  Encouraged him to use incentive spirometer.  Encourage out of bed ambulate.  Discussed with nursing staff.  WBCs trending down follow-up labs tomorrow. CT chest 07/19/2024 Right upper lobe patchy consolidative airspace opacities, most consistent with pneumonia. Upper and lower lobe mucous plugging, including within the distal right main bronchus. Recommend follow-up CT in 3 months to evaluate for complete resolution.Enlarged mediastinal lymph nodes, including a 1.3 cm precarinal lymph node. No gross hilar adenopathy with limited evaluation on this noncontrast study.Nonobstructive bilateral nephrolithiasis. Continue  hypertonic saline nebs ,encourage incentive spirometer and flutter valve, DuoNebs 4 times daily and chest vest therapy by RT.   COPD/chronic asthmatic bronchitis/bronchiectasis: Does not appear to have acute COPD/asthma  exacerbation on 3 L of oxygen . Continue antibiotics and pulmonary hygiene as above   Chronic respiratory failure with  hypoxia: Currently stable on home 2 L supplemental O2 [place].   Type 1 diabetes:CBG (last 3)  Recent Labs    07/20/24 2256 07/21/24 0240 07/21/24 0759  GLUCAP 368* 368* 255*  On Semglee  20 units with sensitive SSI.  Coronary artery disease: Continue aspirin  and simvastatin .   Hypertension: BP stable, does not appear to be on antihypertensives as an outpatient.   Hyperlipidemia: Continue simvastatin .   Hypothyroidism: Continue Synthroid .   Depression/anxiety: Continue trazodone .   BPH: Continue Flomax  and Proscar .   Chronic pain syndrome: Continue home Norco 5-325 mg 3 times daily prn.   Estimated body mass index is 22.89 kg/m as calculated from the following:   Height as of this encounter: 5' 9 (1.753 m).   Weight as of this encounter: 70.3 kg.  DVT prophylaxis: lovenox  Code Status: full Family Communication: none Disposition Plan:  Status is: Inpatient   Consultants:  none  Procedures: none Antimicrobials: Anti-infectives (From admission, onward)    Start     Dose/Rate Route Frequency Ordered Stop   07/19/24 2200  azithromycin  (ZITHROMAX ) 500 mg in sodium chloride  0.9 % 250 mL IVPB        500 mg 250 mL/hr over 60 Minutes Intravenous Every 24 hours 07/18/24 2236 07/24/24 2159   07/19/24 2000  cefTRIAXone  (ROCEPHIN ) 2 g in sodium chloride  0.9 % 100 mL IVPB  Status:  Discontinued        2 g 200 mL/hr over 30 Minutes Intravenous Every 24 hours 07/18/24 2236 07/18/24 2239   07/18/24 2300  piperacillin -tazobactam (ZOSYN ) IVPB 3.375 g        3.375 g 12.5 mL/hr over 240 Minutes Intravenous Every 8 hours 07/18/24 2246     07/18/24 2015  cefTRIAXone  (ROCEPHIN ) 2 g in sodium chloride  0.9 % 100 mL IVPB  Status:  Discontinued        2 g 200 mL/hr over 30 Minutes Intravenous Once 07/18/24 2011 07/18/24 2015   07/18/24 2015  azithromycin  (ZITHROMAX ) 500 mg in sodium chloride  0.9 % 250 mL IVPB  Status:  Discontinued        500 mg 250 mL/hr over 60 Minutes Intravenous   Once 07/18/24 2011 07/18/24 2015   07/18/24 2000  cefTRIAXone  (ROCEPHIN ) 1 g in sodium chloride  0.9 % 100 mL IVPB        1 g 200 mL/hr over 30 Minutes Intravenous  Once 07/18/24 1953 07/18/24 2101   07/18/24 2000  azithromycin  (ZITHROMAX ) 500 mg in sodium chloride  0.9 % 250 mL IVPB        500 mg 250 mL/hr over 60 Minutes Intravenous  Once 07/18/24 1953 07/18/24 2223        Subjective:  Feeling a little better than yesterday still short of breath and coughing started to bring up some phlegm since starting hypertonic saline and chest PT. Encouraged him to get out of bed and ambulate. Objective: Vitals:   07/20/24 2006 07/21/24 0424 07/21/24 0724 07/21/24 0809  BP:  (!) 129/100    Pulse:  97    Resp:      Temp:  98.8 F (37.1 C)  98.5 F (36.9 C)  TempSrc:  Oral  Oral  SpO2: 96% 94% 95%   Weight:      Height:        Intake/Output Summary (Last 24 hours) at 07/21/2024 0945 Last data filed at 07/21/2024 0602 Gross per  24 hour  Intake 1234.45 ml  Output 950 ml  Net 284.45 ml   Filed Weights   07/18/24 1955  Weight: 70.3 kg    Examination:  General exam: Appears calm and comfortable  Respiratory system: Rhonchi right more than left. Respiratory effort normal. Cardiovascular system: Regular Gastrointestinal system: Abdomen is nondistended, soft and nontender. No organomegaly or masses felt. Normal bowel sounds heard. Central nervous system: Alert and oriented. No focal neurological deficits. Extremities: No edema  Data Reviewed: I have personally reviewed following labs and imaging studies  CBC: Recent Labs  Lab 07/18/24 2030 07/19/24 0259  WBC 16.6* 13.4*  NEUTROABS 14.4*  --   HGB 12.1* 9.8*  HCT 35.8* 29.3*  MCV 90.2 90.7  PLT 489* 371   Basic Metabolic Panel: Recent Labs  Lab 07/18/24 2030 07/19/24 0259  NA 132* 132*  K 4.5 4.3  CL 94* 97*  CO2 23 25  GLUCOSE 240* 149*  BUN 15 13  CREATININE 0.80 0.68  CALCIUM 9.6 8.8*   GFR: Estimated  Creatinine Clearance: 86.7 mL/min (by C-G formula based on SCr of 0.68 mg/dL). Liver Function Tests: Recent Labs  Lab 07/18/24 2030  AST 12*  ALT 6  ALKPHOS 171*  BILITOT 0.6  PROT 8.2*  ALBUMIN  3.6   No results for input(s): LIPASE, AMYLASE in the last 168 hours. No results for input(s): AMMONIA in the last 168 hours. Coagulation Profile: Recent Labs  Lab 07/18/24 2030  INR 1.1   Cardiac Enzymes: No results for input(s): CKTOTAL, CKMB, CKMBINDEX, TROPONINI in the last 168 hours. BNP (last 3 results) Recent Labs    07/18/24 2030  PROBNP 190.0   HbA1C: No results for input(s): HGBA1C in the last 72 hours. CBG: Recent Labs  Lab 07/20/24 1637 07/20/24 2008 07/20/24 2256 07/21/24 0240 07/21/24 0759  GLUCAP 155* 157* 368* 368* 255*   Lipid Profile: No results for input(s): CHOL, HDL, LDLCALC, TRIG, CHOLHDL, LDLDIRECT in the last 72 hours. Thyroid  Function Tests: No results for input(s): TSH, T4TOTAL, FREET4, T3FREE, THYROIDAB in the last 72 hours. Anemia Panel: No results for input(s): VITAMINB12, FOLATE, FERRITIN, TIBC, IRON, RETICCTPCT in the last 72 hours. Sepsis Labs: Recent Labs  Lab 07/18/24 2037  LATICACIDVEN 1.7    Recent Results (from the past 240 hours)  Culture, blood (routine x 2)     Status: None (Preliminary result)   Collection Time: 07/18/24  8:30 PM   Specimen: BLOOD LEFT ARM  Result Value Ref Range Status   Specimen Description   Final    BLOOD LEFT ARM Performed at [facility], [address] [place], [place] [address]    Special Requests   Final    BOTTLES DRAWN AEROBIC AND ANAEROBIC Blood Culture adequate volume Performed at [facility], [address], [place], [place] [address]    Culture   Final    NO GROWTH 1 [place] Performed at [facility], [address] [place], [place] [address]    Report Status PENDING  Incomplete  Resp panel by RT-PCR  (RSV, Flu A&B, Covid) Anterior Nasal Swab     Status: None   Collection Time: 07/18/24  8:37 PM   Specimen: Anterior Nasal Swab  Result Value Ref Range Status   SARS Coronavirus 2 by RT PCR NEGATIVE NEGATIVE Final    Comment: (NOTE) SARS-CoV-2 target nucleic acids are NOT DETECTED.  The SARS-CoV-2 RNA is generally detectable in upper respiratory specimens during the acute phase of infection.  The lowest concentration of SARS-CoV-2 viral copies this assay can detect is 138 copies/mL. A negative result does not preclude SARS-Cov-2 infection and should not be used as the sole basis for treatment or other patient management decisions. A negative result may occur with  improper specimen collection/handling, submission of specimen other than nasopharyngeal swab, presence of viral mutation(s) within the areas targeted by this assay, and inadequate number of viral copies(<138 copies/mL). A negative result must be combined with clinical observations, patient history, and epidemiological information. The expected result is Negative.  Fact Sheet for Patients:  bloggercourse.com  Fact Sheet for Healthcare Providers:  seriousbroker.it  This test is no t yet approved or cleared by the [place]  FDA and  has been authorized for detection and/or diagnosis of SARS-CoV-2 by FDA under an Emergency Use Authorization (EUA). This EUA will remain  in effect (meaning this test can be used) for the duration of the COVID-19 declaration under Section 564(b)(1) of the Act, 21 U.S.C.section 360bbb-3(b)(1), unless the authorization is terminated  or revoked sooner.       Influenza A by PCR NEGATIVE NEGATIVE Final   Influenza B by PCR NEGATIVE NEGATIVE Final    Comment: (NOTE) The Xpert Xpress SARS-CoV-2/FLU/RSV plus assay is intended as an aid in the diagnosis of influenza from Nasopharyngeal swab specimens and should not be used as a sole basis for  treatment. Nasal washings and aspirates are unacceptable for Xpert Xpress SARS-CoV-2/FLU/RSV testing.  Fact Sheet for Patients: bloggercourse.com  Fact Sheet for Healthcare Providers: seriousbroker.it  This test is not yet approved or cleared by the [place]  FDA and has been authorized for detection and/or diagnosis of SARS-CoV-2 by FDA under an Emergency Use Authorization (EUA). This EUA will remain in effect (meaning this test can be used) for the duration of the COVID-19 declaration under Section 564(b)(1) of the Act, 21 U.S.C. section 360bbb-3(b)(1), unless the authorization is terminated or revoked.     Resp Syncytial Virus by PCR NEGATIVE NEGATIVE Final    Comment: (NOTE) Fact Sheet for Patients: bloggercourse.com  Fact Sheet for Healthcare Providers: seriousbroker.it  This test is not yet approved or cleared by the [place]  FDA and has been authorized for detection and/or diagnosis of SARS-CoV-2 by FDA under an Emergency Use Authorization (EUA). This EUA will remain in effect (meaning this test can be used) for the duration of the COVID-19 declaration under Section 564(b)(1) of the Act, 21 U.S.C. section 360bbb-3(b)(1), unless the authorization is terminated or revoked.  Performed at [facility], [address] [place], [place] [address]   Expectorated Sputum Assessment w Gram Stain, Rflx to Resp Cult     Status: None   Collection Time: 07/19/24  2:00 PM   Specimen: Expectorated Sputum  Result Value Ref Range Status   Specimen Description EXPECTORATED SPUTUM  Final   Special Requests NONE  Final   Sputum evaluation   Final    THIS SPECIMEN IS ACCEPTABLE FOR SPUTUM CULTURE Performed at [facility], [address], [place], [place] [address]    Report Status 07/19/2024 FINAL  Final  Culture, Respiratory w Gram Stain      Status: None (Preliminary result)   Collection Time: 07/19/24  2:00 PM  Result Value Ref Range Status   Specimen Description   Final    EXPECTORATED SPUTUM Performed at [facility], [address] [place], [place] [address]    Special Requests   Final    NONE Reflexed from  [id number] Performed at [facility], [address], [place], [place] [address]    Gram Stain   Final    ABUNDANT WBC PRESENT, PREDOMINANTLY PMN MODERATE GRAM POSITIVE COCCI RARE GRAM POSITIVE RODS    Culture   Final    NO GROWTH < 12 HOURS Performed at [facility], [address], [place], [place] [address]    Report Status PENDING  Incomplete  MRSA Next Gen by PCR, Nasal     Status: None   Collection Time: 07/19/24  3:00 PM   Specimen: Nasal Mucosa; Nasal Swab  Result Value Ref Range Status   MRSA by PCR Next Gen NOT DETECTED NOT DETECTED Final    Comment: (NOTE) The GeneXpert MRSA Assay (FDA approved for NASAL specimens only), is one component of a comprehensive MRSA colonization surveillance program. It is not intended to diagnose MRSA infection nor to guide or monitor treatment for MRSA infections. Test performance is not FDA approved in patients less than 34 years old. Performed at [facility], [address], [place], [place] [address]   Culture, blood (routine x 2)     Status: None (Preliminary result)   Collection Time: 07/19/24  3:22 PM   Specimen: BLOOD RIGHT ARM  Result Value Ref Range Status   Specimen Description   Final    BLOOD RIGHT ARM Performed at [facility], [address] [place], [place] [address]    Special Requests   Final    BOTTLES DRAWN AEROBIC ONLY Blood Culture adequate volume Performed at [facility], [address], [place], [place] [address]    Culture   Final    NO GROWTH < 12 HOURS Performed at [facility], [address] [place], [place] [address]     Report Status PENDING  Incomplete         Radiology Studies: CT CHEST WO CONTRAST Result Date: 07/19/2024 EXAM: CT CHEST WITHOUT CONTRAST 07/19/2024 09:55:19 AM TECHNIQUE: CT of the chest was performed without the administration of intravenous contrast. Multiplanar reformatted images are provided for review. Automated exposure control, iterative reconstruction, and/or weight based adjustment of the mA/kV was utilized to reduce the radiation dose to as low as reasonably achievable. COMPARISON: None available. CLINICAL HISTORY: Pneumonia, complication suspected, xray done; recurrent RUL pneumonia. FINDINGS: MEDIASTINUM: Heart: At least 3-vessel coronary artery calcifications. Aortic valve leaflet calcification. Pericardium is unremarkable. The central airways demonstrate mucous plugging noted within the distal bronchus. Mild-to-moderate atherosclerotic plaque. LYMPH NODES: Enlarged mediastinal lymph nodes, including a 1.3 cm precarinal lymph node. No gross hilar adenopathy with markedly limited evaluation on this noncontrast study and associated adjacent lung disease. No axillary lymphadenopathy. LUNGS AND PLEURA: Right upper lobe patchy consolidative airspace opacities. Upper and lower lobe mucous plugging. No pulmonary edema. No pleural effusion or pneumothorax. SOFT TISSUES/BONES: Old healed bilateral posterior rib fractures. No acute abnormality of the bones or soft tissues. UPPER ABDOMEN: Limited images of the upper abdomen demonstrate a tiny hiatal hernia. Bilateral nephrolithiasis with a 7 mm left stone and punctate right stones noted. IMPRESSION: 1. Right upper lobe patchy consolidative airspace opacities, most consistent with pneumonia. Upper and lower lobe mucous plugging, including within the distal right main bronchus. Recommend follow-up CT in 3 months to evaluate for complete resolution. 2. Enlarged mediastinal lymph nodes, including a 1.3 cm precarinal lymph node. No gross hilar adenopathy  with limited evaluation on this noncontrast study. 3. Nonobstructive bilateral nephrolithiasis. Electronically signed by: Morgane  Naveau MD 07/19/2024 11:15 AM EST RP Workstation: [id number]     Scheduled Meds:  arformoterol   15 mcg Nebulization BID   aspirin  EC  81 mg Oral QHS   budesonide  (PULMICORT ) nebulizer solution  0.25 mg Nebulization BID   enoxaparin  (LOVENOX ) injection  40 mg Subcutaneous Q24H   feeding supplement  237 mL Oral BID BM   finasteride   5 mg Oral QHS   guaiFENesin   600 mg Oral BID   HYDROcodone -acetaminophen   1 tablet Oral TID   insulin  aspart  0-5 Units Subcutaneous QHS   insulin  aspart  0-9 Units Subcutaneous TID WC   insulin  glargine  20 Units Subcutaneous Daily   ipratropium-albuterol   3 mL Nebulization QID   levothyroxine   100 mcg Oral QHS   pantoprazole   40 mg Oral QHS   simvastatin   40 mg Oral QHS   sodium chloride  flush  3 mL Intravenous Q12H   tamsulosin   0.4 mg Oral QHS   traZODone   150 mg Oral QHS   Continuous Infusions:  azithromycin  500 mg (07/20/24 2107)   piperacillin -tazobactam (ZOSYN )  IV 3.375 g (07/21/24 0602)     LOS: 3 days     Almarie KANDICE Hoots, MD  07/21/2024, 9:45 AM   "

## 2024-07-21 NOTE — Evaluation (Signed)
 Physical Therapy Evaluation Patient Details Name: James [name] MRN: [id number] DOB: 1956-03-02 Today's Date: 07/21/2024  History of Present Illness  James [name] is a 69 y.o. male who presented to the ED for evaluation of shortness of breath. Recent recurrent pneumonia as well as aspiration since prior cervical fusion.  He was last admitted September 2025 with acute hypoxic respiratory failure secondary to right-sided pneumonia and COPD exacerbation.  Since then he has been requiring 2 L supplemental O2 via [place] at baseline.  He had initial improvement for the first 2 weeks after discharge but since then has had progressively worsening dyspnea on exertion and cough. Dx with sepsis with R upper lobe pneumonia seen on chest x-ray 06/25/2024.   PMH: COPD/chronic asthmatic bronchitis and bronchiectasis with chronic respiratory failure with hypoxia on 2 L O2 [name], CAD, T1DM, HTN, HLD, hypothyroidism, BPH, depression/anxiety, chronic pain  Clinical Impression  Pt admitted with above diagnosis. PTA, pt reports ind without AD, shares household chores with spouse, has 2 steps to enter home, pt reports has lost strength and decreased activity tolerance over the past couple of months and hopeful to regain strength and improve activity level. On eval, pt on 3L O2 upon arrival, amb 60 ft with no AD, supv for safety with therapist managing O2 tubing and IV pole, SpO2 93% with activity and 94% at rest. Pt returns to supine in bed upon return to room, educated on time up in recliner as able and pt verbalizes understanding. Recommend OPPT for strength and high level balance training to improve pt's strength and endurance. Pt currently with functional limitations due to the deficits listed below (see PT Problem List). Pt will benefit from acute skilled PT to increase their independence and safety with mobility to allow discharge.           If plan is discharge home, recommend the following: Assistance with cooking/housework;Assist  for transportation;Help with stairs or ramp for entrance   Can travel by private vehicle        Equipment Recommendations None recommended by PT  Recommendations for Other Services       Functional Status Assessment Patient has had a recent decline in their functional status and demonstrates the ability to make significant improvements in function in a reasonable and predictable amount of time.     Precautions / Restrictions Precautions Precautions: None Precaution/Restrictions Comments: watch O2 sats Restrictions Weight Bearing Restrictions Per Provider Order: No      Mobility  Bed Mobility Overal bed mobility: Modified Independent             General bed mobility comments: light use of bedrails to assist trunk into sitting    Transfers Overall transfer level: Needs assistance Equipment used: None Transfers: Sit to/from Stand Sit to Stand: Supervision           General transfer comment: supv for safety with O2 tubing    Ambulation/Gait Ambulation/Gait assistance: Supervision Gait Distance (Feet): 60 Feet Assistive device: None Gait Pattern/deviations: Step-through pattern, Decreased stride length       General Gait Details: step through gait pattern, good bil foot clearance, decreased cadence but functional, decreased bil arm swing, no overt LOB with head turns or direction changes, on 3L with SpO2 93%, therapist managing o2 tank and IV pole  Stairs            Wheelchair Mobility     Tilt Bed    Modified Rankin (Stroke Patients Only)       Balance  Overall balance assessment: No apparent balance deficits (not formally assessed)                                           Pertinent Vitals/Pain Pain Assessment Pain Assessment: Faces Faces Pain Scale: Hurts a little bit Pain Location: back and neck Pain Descriptors / Indicators: Aching (chronic) Pain Intervention(s): Limited activity within patient's tolerance,  Monitored during session, Repositioned    Home Living Family/patient expects to be discharged to:: Private residence Living Arrangements: Spouse/significant other Available Help at Discharge: Family Type of Home: House Home Access: Stairs to enter Entrance Stairs-Rails: None Entrance Stairs-Number of Steps: 2   Home Layout: One level Home Equipment: None Additional Comments: 2 ltrs O2 at baseline    Prior Function Prior Level of Function : Independent/Modified Independent;Driving             Mobility Comments: Independent with ambulation. ADLs Comments: Independent with ADLs, driving, and sharing the household cleaning with his spouse.     Extremity/Trunk Assessment   Upper Extremity Assessment Upper Extremity Assessment: Defer to OT evaluation    Lower Extremity Assessment Lower Extremity Assessment: Overall WFL for tasks assessed (denies numbness/tingling in bil feet)    Cervical / Trunk Assessment Cervical / Trunk Assessment: Normal  Communication   Communication Communication: No apparent difficulties    Cognition Arousal: Alert Behavior During Therapy: WFL for tasks assessed/performed   PT - Cognitive impairments: No apparent impairments                         Following commands: Intact       Cueing Cueing Techniques: Verbal cues     General Comments General comments (skin integrity, edema, etc.): Pt on 3L entire eval, 93% with ambulation and 94% at rest; verbal cuse for pursed lip breathing    Exercises     Assessment/Plan    PT Assessment Patient needs continued PT services  PT Problem List Decreased activity tolerance;Cardiopulmonary status limiting activity       PT Treatment Interventions DME instruction;Gait training;Stair training;Functional mobility training;Therapeutic activities;Therapeutic exercise;Patient/family education    PT Goals (Current goals can be found in the Care Plan section)  Acute Rehab PT Goals Patient  Stated Goal: regain strength and activity level PT Goal Formulation: With patient Time For Goal Achievement: 08/04/24 Potential to Achieve Goals: Good    Frequency Min 2X/week     Co-evaluation               AM-PAC PT 6 Clicks Mobility  Outcome Measure Help needed turning from your back to your side while in a flat bed without using bedrails?: None Help needed moving from lying on your back to sitting on the side of a flat bed without using bedrails?: None Help needed moving to and from a bed to a chair (including a wheelchair)?: A Little Help needed standing up from a chair using your arms (e.g., wheelchair or bedside chair)?: A Little Help needed to walk in hospital room?: A Little Help needed climbing 3-5 steps with a railing? : A Little 6 Click Score: 20    End of Session Equipment Utilized During Treatment: Gait belt;Oxygen  Activity Tolerance: Patient tolerated treatment well Patient left: in bed;with call bell/phone within reach;with bed alarm set Nurse Communication: Mobility status;Other (comment) (SpO2) PT Visit Diagnosis: Other abnormalities of gait and mobility (R26.89)  Time: 9056-8986 PT Time Calculation (min) (ACUTE ONLY): 30 min   Charges:   PT Evaluation $PT Eval Low Complexity: 1 Low PT Treatments $Gait Training: 8-22 mins PT General Charges $$ ACUTE PT VISIT: 1 Visit         Tori David Towson PT, DPT 07/21/2024, 10:23 AM

## 2024-07-22 ENCOUNTER — Inpatient Hospital Stay (HOSPITAL_COMMUNITY)

## 2024-07-22 DIAGNOSIS — R6881 Early satiety: Secondary | ICD-10-CM

## 2024-07-22 DIAGNOSIS — R630 Anorexia: Secondary | ICD-10-CM

## 2024-07-22 DIAGNOSIS — R110 Nausea: Secondary | ICD-10-CM | POA: Diagnosis not present

## 2024-07-22 DIAGNOSIS — J189 Pneumonia, unspecified organism: Secondary | ICD-10-CM

## 2024-07-22 DIAGNOSIS — R634 Abnormal weight loss: Secondary | ICD-10-CM

## 2024-07-22 DIAGNOSIS — A419 Sepsis, unspecified organism: Secondary | ICD-10-CM | POA: Diagnosis not present

## 2024-07-22 LAB — BASIC METABOLIC PANEL WITH GFR
Anion gap: 13 (ref 5–15)
BUN: 8 mg/dL (ref 8–23)
CO2: 26 mmol/L (ref 22–32)
Calcium: 9.4 mg/dL (ref 8.9–10.3)
Chloride: 98 mmol/L (ref 98–111)
Creatinine, Ser: 0.7 mg/dL (ref 0.61–1.24)
GFR, Estimated: >60 mL/min
Glucose, Bld: 236 mg/dL — ABNORMAL HIGH (ref 70–99)
Potassium: 4.8 mmol/L (ref 3.5–5.1)
Sodium: 137 mmol/L (ref 135–145)

## 2024-07-22 LAB — CBC
HCT: 31.3 % — ABNORMAL LOW (ref 39.0–52.0)
Hemoglobin: 10.4 g/dL — ABNORMAL LOW (ref 13.0–17.0)
MCH: 29.8 pg (ref 26.0–34.0)
MCHC: 33.2 g/dL (ref 30.0–36.0)
MCV: 89.7 fL (ref 80.0–100.0)
Platelets: 365 K/uL (ref 150–400)
RBC: 3.49 MIL/uL — ABNORMAL LOW (ref 4.22–5.81)
RDW: 12.1 % (ref 11.5–15.5)
WBC: 12.2 K/uL — ABNORMAL HIGH (ref 4.0–10.5)
nRBC: 0 % (ref 0.0–0.2)

## 2024-07-22 LAB — GLUCOSE, CAPILLARY
Glucose-Capillary: 271 mg/dL — ABNORMAL HIGH (ref 70–99)
Glucose-Capillary: 246 mg/dL — ABNORMAL HIGH (ref 70–99)
Glucose-Capillary: 114 mg/dL — ABNORMAL HIGH (ref 70–99)
Glucose-Capillary: 104 mg/dL — ABNORMAL HIGH (ref 70–99)
Glucose-Capillary: 168 mg/dL — ABNORMAL HIGH (ref 70–99)

## 2024-07-22 MED ORDER — BISACODYL 10 MG RE SUPP: 10.0000 mg | Freq: Once | RECTAL | Status: DC

## 2024-07-22 MED ORDER — METOCLOPRAMIDE HCL 5 MG/ML IJ SOLN
5.0000 mg | Freq: Three times a day (TID) | INTRAMUSCULAR | Status: DC
  Administered 2024-07-22 – 2024-07-24 (×6): 5 mg via INTRAVENOUS
  Filled 2024-07-22 (×7): qty 2

## 2024-07-22 MED ORDER — BISACODYL 5 MG PO TBEC
10.0000 mg | DELAYED_RELEASE_TABLET | Freq: Every day | ORAL | Status: DC
  Administered 2024-07-23 – 2024-07-24 (×2): 10 mg via ORAL
  Filled 2024-07-22 (×2): qty 2

## 2024-07-22 MED ORDER — POLYETHYLENE GLYCOL 3350 17 G PO PACK
17.0000 g | PACK | Freq: Two times a day (BID) | ORAL | Status: DC
  Administered 2024-07-22 – 2024-07-24 (×4): 17 g via ORAL
  Filled 2024-07-22 (×4): qty 1

## 2024-07-22 MED ORDER — SENNOSIDES-DOCUSATE SODIUM 8.6-50 MG PO TABS
3.0000 | ORAL_TABLET | Freq: Every day | ORAL | Status: DC
  Administered 2024-07-22 – 2024-07-23 (×2): 3 via ORAL
  Filled 2024-07-22 (×3): qty 3

## 2024-07-22 NOTE — Progress Notes (Signed)
 Mobility Specialist - Progress Note  ([place] 2L) Pre-mobility: 113 bpm HR, 92% SpO2 During mobility: 123 bpm HR, 87% SpO2 Post-mobility: 117 bpm HR, 90% SPO2   07/22/24 1105  Mobility  Activity Ambulated with assistance  Level of Assistance Standby assist, set-up cues, supervision of patient - no hands on  Assistive Device Other (Comment) (Hallway Rail)  Distance Ambulated (ft) 100 ft  Range of Motion/Exercises Active  Activity Response Tolerated fair  Mobility visit 1 Mobility  Mobility Specialist Start Time (ACUTE ONLY) 1050  Mobility Specialist Stop Time (ACUTE ONLY) 1105  Mobility Specialist Time Calculation (min) (ACUTE ONLY) 15 min   Pt was found in bed requesting assistance to bathroom. Agreeable to mobilize afterwards. Pt had x1 seated rest break after bathroom use. Grew fatigued with session and stated felling nauseous at EOS. Was left in bed with all needs met. Call bell in reach and RN notified.   Erminio Leos,  Mobility Specialist Can be reached via Secure Chat

## 2024-07-22 NOTE — Progress Notes (Signed)
 Unable to perform CPT via BED option, not available on this particular bed.  Pt had a spontaneous NPC during neb tx. Will continue to monitor.

## 2024-07-22 NOTE — Consult Note (Signed)
 "   Consultation  Referring Provider:    Almarie Hoots, MD Primary Care Physician:  Nichole Senior, MD Primary Gastroenterologist:     [facility] (previously Dr. Debrah)    Reason for Consultation:     Nausea, weight loss         HPI:   James [name] is a 69 y.o. male with a history of COPD/chronic asthmatic bronchitis and bronchiectasis with chronic respiratory failure with hypoxia on 2 L O2 [place], CAD, T1DM, HTN, HLD, hypothyroidism, BPH, depression/anxiety, chronic pain, admitted on 07/18/2024 with RUL pneumonia and sepsis.  Has a history of recurrent pneumonia and aspiration since prior cervical fusion and has been on 2 L supplemental O2 since hospital admission in 03/2024.  Pulmonary function improving with antibiotics, I-S, nebulizer treatments, and chest vest therapy by RT.  At baseline 2-3 L supplemental O2 now.  GI service consulted for chronic nausea without emesis.  Patient has had reduced appetite and lost 35# since September.  No abdominal pain.  Primarily daily nausea, but does also endorse early satiety and reduced appetite overall.  Nausea improves with Phenergan .  No dysphagia, odynophagia.  No overt bleeding or change in bowel habits.    Does have a prior history of oropharyngeal dysphagia and history of aspiration on MBS on 02/14/2023 and 10/2019.  MBS during hospital admission on 04/16/2024, and swallow function appears somewhat improved compared with 01/2023.  Was evaluated by SLP on 07/20/2024.  No overt signs of aspiration or dysphagia on exam and recommended continued regular diet and thin liquids with use of aspiration precautions, mobility, oral hygiene.  Patient declined repeat MBS.  Reviewed CT chest from this admission and other than tiny hiatal hernia, normal upper abdomen.  No prior EGD.  Last colonoscopy was 12/2010 and normal.  -Charted weight on this admission 70.3 kg - 04/03/2024: 76.7 kg - 02/13/2024: 78 kg - 08/01/2023: 80.3 kg - 03/29/2023: 79.6 kg  Past Medical  History:  Diagnosis Date   Anxiety    Asthma    ASTHMATIC BRONCHITIS, ACUTE 10/25/2008   Bladder neck obstruction    CARPAL TUNNEL SYNDROME, BILATERAL 07/31/2007   issues resolved, no surgery   Cervical disc disease    CORONARY ARTERY DISEASE 04/03/2007   Depression    DIABETES MELLITUS, TYPE I 04/03/2007   Diabetic retinopathy associated with diabetes mellitus due to underlying condition (HCC) 04/03/2007   DM W/EYE MANIFESTATIONS, TYPE I, UNCONTROLLED 04/04/2007   DM W/RENAL MNFST, TYPE I, UNCONTROLLED 04/04/2007   ED (erectile dysfunction)    History of kidney stones    HYPERLIPIDEMIA 04/04/2007   Hypothyroidism    Pneumonia    several times and again today (02/13/2104)   Renal insufficiency    Seizures (HCC)    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; none in the last couple years (02/12/2014)   Spinal stenosis     Past Surgical History:  Procedure Laterality Date   ANTERIOR CERVICAL DECOMP/DISCECTOMY FUSION  2000   couple screws and a plate   ANTERIOR CERVICAL DECOMP/DISCECTOMY FUSION N/A 06/24/2016   Procedure: ANTERIOR CERVICAL DECOMPRESSION/DISCECTOMY FUSION CERVICAL FOUR - CERVICAL FIVE, CERVICAL FIVE - CERVICAL SIX; REMOVAL TETHER CERVICAL PLATE;  Surgeon: Lamar Peaches, MD;  Location: [facility];  Service: Neurosurgery;  Laterality: N/A;  ANTERIOR CERVICAL DECOMPRESSION/DISCECTOMY FUSION CERVICAL FOUR - CERVICAL FIVE, CERVICAL FIVE - CERVICAL SIX; REMOVAL TETHER CERVICAL PLATE   APPENDECTOMY     BACK SURGERY     CARDIAC CATHETERIZATION  1990's   CATARACT EXTRACTION W/  INTRAOCULAR LENS  IMPLANT, BILATERAL Bilateral    CYSTOSCOPY WITH RETROGRADE PYELOGRAM, URETEROSCOPY AND STENT PLACEMENT Bilateral 04/06/2013   Procedure: BILATERAL CYSTOSCOPY WITH RETROGRADE PYELOGRAMS, STENT PLACEMENTS AND LEFT URETEROSCOPY AND STONE REMOVAL;  Surgeon: Ricardo Likens, MD;  Location: [facility];  Service: Urology;  Laterality: Bilateral;   CYSTOSCOPY WITH STENT PLACEMENT Right 04/12/2013    Procedure: CYSTOSCOPY WITH STENT PLACEMENT;  Surgeon: Ricardo Likens, MD;  Location: [facility];  Service: Urology;  Laterality: Right;   CYSTOSCOPY/RETROGRADE/URETEROSCOPY Bilateral 04/12/2013   Procedure: CYSTOSCOPY/RETROGRADE/URETEROSCOPY;  Surgeon: Ricardo Likens, MD;  Location: [facility];  Service: Urology;  Laterality: Bilateral;  RIGHT RETROGRADE    EXCISION MASS LOWER EXTREMETIES Left 03/01/2024   Procedure: EXCISION MASS LOWER EXTREMITIES;  Surgeon: Teresa Lonni HERO, MD;  Location: [facility];  Service: General;  Laterality: Left;  REMOVAL OF LEFT INNER THIGH MASS   HOLMIUM LASER APPLICATION Left 04/06/2013   Procedure: HOLMIUM LASER APPLICATION;  Surgeon: Ricardo Likens, MD;  Location: [facility];  Service: Urology;  Laterality: Left;   LEFT HEART CATH AND CORONARY ANGIOGRAPHY N/A 04/29/2020   Procedure: LEFT HEART CATH AND CORONARY ANGIOGRAPHY;  Surgeon: Elmira Newman PARAS, MD;  Location: [facility];  Service: Cardiovascular;  Laterality: N/A;   LUMBAR LAMINECTOMY/DECOMPRESSION MICRODISCECTOMY N/A 04/20/2019   Procedure: Lumbar microdisectomy and decompression L5-S1 left;  Surgeon: Heide Ingle, MD;  Location: [facility];  Service: Orthopedics;  Laterality: N/A;    LYMPH NODE DISSECTION  ~ 1960   groin   stress cardiolite  09/06/2002   TONSILLECTOMY     VITRECTOMY Bilateral     Family History  Problem Relation Age of Onset   Stroke Father        strong FH cerebrovascular disease   Diabetes Brother    Heart disease Brother        CHF   Cancer Mother    Colon cancer Neg Hx      Social History[1]  Prior to Admission medications  Medication Sig Start Date End Date Taking? Authorizing Provider  acetaminophen  (TYLENOL ) 500 MG tablet Take 1,000 mg by mouth every 6 (six) hours as needed for mild pain or headache.   Yes [provider]  albuterol  (PROVENTIL ) (2.5 MG/3ML) 0.083% nebulizer solution Take 3 mLs by nebulization every 6 (six) hours as needed. 04/18/24  Yes  Raenelle Coria, MD  albuterol  (VENTOLIN  HFA) 108 (90 Base) MCG/ACT inhaler Inhale 1 puff into the lungs every 4 (four) hours as needed. 06/12/24  Yes   aspirin  EC 81 MG tablet Take 1 tablet (81 mg total) by mouth daily. Swallow whole. Patient taking differently: Take 81 mg by mouth at bedtime. Swallow whole. 04/16/20  Yes Tolia, Sunit, DO  cetirizine (ZYRTEC) 10 MG tablet Take 10 mg by mouth at bedtime.   Yes [provider]  Continuous Blood Gluc Sensor (FREESTYLE LIBRE 3 SENSOR) MISC Change sensor every 14 days to monitor blood glucose continously 04/08/22  Yes   Continuous Glucose Sensor (FREESTYLE LIBRE 3 PLUS SENSOR) MISC Inject 1 Device into the skin See admin instructions. Place a new sensor into the skin every 15 days   Yes [provider]  finasteride  (PROSCAR ) 5 MG tablet Take 1 tablet (5 mg total) by mouth daily. Patient taking differently: Take 5 mg by mouth at bedtime. 03/23/24  Yes   fluticasone -salmeterol (ADVAIR ) 250-50 MCG/ACT AEPB Inhale 1 puff into the lungs every 12 (twelve) hours. 05/03/24  Yes Jude Harden GAILS, MD  Glucagon  (GVOKE HYPOPEN  2-PACK) 1 MG/0.2ML  SOAJ Inject under the skin as directed for severe hypoglycemia. 06/17/22  Yes   glucose blood (CONTOUR NEXT TEST) test strip USE 8 TIMES DAILY AS DIRECTED TO MONITOR BLOOD GLUCOSE 04/23/22  Yes   guaiFENesin  (MUCINEX ) 600 MG 12 hr tablet Take 2 tablets (1,200 mg total) by mouth 2 (two) times daily. 04/18/24  Yes Raenelle Coria, MD  HYDROcodone -acetaminophen  (NORCO/VICODIN) 5-325 MG tablet Take 1 tablet by mouth 3 (three) times daily as needed 06/29/24  Yes   ibuprofen  (ADVIL ) 200 MG tablet Take 800 mg by mouth every 8 (eight) hours as needed (for pain).   Yes [provider]  insulin  aspart (NOVOLOG  FLEXPEN) 100 UNIT/ML FlexPen Inject up to 100 units subcutaneously per [place] as directed (ICR 20, ISF 20, target 120) 04/02/24  Yes   insulin  glargine (LANTUS  SOLOSTAR) 100 UNIT/ML Solostar Pen Inject 25-30 Units  into the skin daily as directed Patient taking differently: Inject 27 Units into the skin daily before breakfast. 10/20/23  Yes   Insulin  Pen Needle (TECHLITE PEN NEEDLES) 31G X 8 MM MISC Use 1 pen needle 4-5 times daily as directed. 11/30/22  Yes   Insulin  Pen Needle 31G X 8 MM MISC Use 1 pen needle subcutaneously 4 - 5 times daily as directed. 09/07/21  Yes   Insulin  Syringe-Needle U-100 31G X 5/16 0.3 ML MISC use to inject insulin  6 to 8 times daily 12/07/23  Yes Nichole, Garnette, MD  ipratropium-albuterol  (DUONEB) 0.5-2.5 (3) MG/3ML SOLN Inhale 3 mLs into the lungs every 6 (six) hours as needed with nebulizer. 04/24/24  Yes   levofloxacin  (LEVAQUIN ) 750 MG tablet Take 1 tablet (750 mg total) by mouth daily. Patient not taking: Reported on 07/18/2024 06/26/24   Charley Conger, PA-C  levothyroxine  (SYNTHROID ) 100 MCG tablet Take 1 tablet (100 mcg total) by mouth daily. Patient taking differently: Take 100 mcg by mouth at bedtime. 03/22/24  Yes   metoCLOPramide  (REGLAN ) 5 MG tablet Take 1 tablet (5 mg total) by mouth 2 (two) times daily before a meal. 05/14/24  Yes   pantoprazole  (PROTONIX ) 40 MG tablet Take 1 tablet by mouth daily as directed. Patient taking differently: Take 40 mg by mouth at bedtime. 02/21/24  Yes   promethazine  (PHENERGAN ) 25 MG tablet Take 1 tablet (25 mg total) by mouth 3 (three) times daily as needed. 06/23/24  Yes   promethazine -dextromethorphan  (PROMETHAZINE -DM) 6.25-15 MG/5ML syrup Take 5 mLs by mouth 4 (four) times daily as needed for cough. 06/18/24  Yes Charley Conger, PA-C  simvastatin  (ZOCOR ) 40 MG tablet Take 1 tablet (40 mg total) by mouth daily. Patient taking differently: Take 40 mg by mouth at bedtime. 07/27/23  Yes   tamsulosin  (FLOMAX ) 0.4 MG CAPS capsule Take 1 capsule (0.4 mg total) by mouth daily. Patient taking differently: Take 0.4 mg by mouth at bedtime. 03/20/24  Yes   traZODone  (DESYREL ) 150 MG tablet Take 1 tablet (150 mg total) by mouth at bedtime as needed for  sleep Patient taking differently: Take 150 mg by mouth See admin instructions. Take 150 mg by mouth at bedtime 02/19/24  Yes   albuterol  (VENTOLIN  HFA) 108 (90 Base) MCG/ACT inhaler Inhale 2 puffs into the lungs every 6 (six) hours as needed for wheezing or shortness of breath. 07/03/24   Jude Harden GAILS, MD  Glucagon  (GVOKE HYPOPEN  2-PACK) 1 MG/0.2ML SOAJ Use as directed under the skin for severe hypoglycemia. Patient not taking: Reported on 07/18/2024 05/31/23     ipratropium-albuterol  (DUONEB) 0.5-2.5 (3) MG/3ML SOLN Inhale  1 vial (3 mLs) every 6 (six) hours. Patient not taking: Reported on 07/18/2024 04/25/24     predniSONE  (DELTASONE ) 10 MG tablet Take 4 tabs  daily with food x 4 days, then 3 tabs daily x 4 days, then 2 tabs daily x 4 days, then 1 tab daily x4 days then stop. Patient not taking: Reported on 07/18/2024 05/02/24   Jude Harden GAILS, MD  sodium chloride  (OCEAN) 0.65 % SOLN nasal spray Place 1 spray into both nostrils as needed for congestion.    [provider]    Current Facility-Administered Medications  Medication Dose Route Frequency Provider Last Rate Last Admin   acetaminophen  (TYLENOL ) tablet 650 mg  650 mg Oral Q6H PRN Patel, [name] R, MD       Or   acetaminophen  (TYLENOL ) suppository 650 mg  650 mg Rectal Q6H PRN Patel, [name] R, MD       arformoterol  (BROVANA ) nebulizer solution 15 mcg  15 mcg Nebulization BID Patel, [name] R, MD   15 mcg at 07/22/24 9096   aspirin  EC tablet 81 mg  81 mg Oral QHS Patel, [name] R, MD   81 mg at 07/21/24 2151   azithromycin  (ZITHROMAX ) 500 mg in sodium chloride  0.9 % 250 mL IVPB  500 mg Intravenous Q24H Patel, [name] R, MD 250 mL/hr at 07/21/24 2158 500 mg at 07/21/24 2158   bisacodyl  (DULCOLAX) EC tablet 5 mg  5 mg Oral Daily PRN Patel, [name] R, MD       budesonide  (PULMICORT ) nebulizer solution 0.25 mg  0.25 mg Nebulization BID Patel, [name] R, MD   0.25 mg at 07/22/24 0905   enoxaparin  (LOVENOX ) injection 40 mg  40 mg  Subcutaneous Q24H Patel, [name] R, MD   40 mg at 07/21/24 9147   feeding supplement (ENSURE PLUS HIGH PROTEIN) liquid 237 mL  237 mL Oral BID BM Will Almarie MATSU, MD   237 mL at 07/22/24 0856   finasteride  (PROSCAR ) tablet 5 mg  5 mg Oral QHS Patel, [name] R, MD   5 mg at 07/21/24 2151   guaiFENesin  (MUCINEX ) 12 hr tablet 600 mg  600 mg Oral BID Patel, [name] R, MD   600 mg at 07/21/24 2151   guaiFENesin -dextromethorphan  (ROBITUSSIN DM) 100-10 MG/5ML syrup 5 mL  5 mL Oral Q4H PRN Will Almarie MATSU, MD   5 mL at 07/21/24 0654   HYDROcodone -acetaminophen  (NORCO/VICODIN) 5-325 MG per tablet 1 tablet  1 tablet Oral TID Will Almarie MATSU, MD   1 tablet at 07/21/24 2150   HYDROmorphone  (DILAUDID ) injection 0.5 mg  0.5 mg Intravenous Q4H PRN Mathews, Elizabeth G, MD   0.5 mg at 07/22/24 0420   insulin  aspart (novoLOG ) injection 0-5 Units  0-5 Units Subcutaneous QHS Patel, [name] R, MD   2 Units at 07/21/24 2152   insulin  aspart (novoLOG ) injection 0-9 Units  0-9 Units Subcutaneous TID WC Patel, [name] R, MD   5 Units at 07/22/24 0856   insulin  aspart (novoLOG ) injection 6 Units  6 Units Subcutaneous TID WC Will Almarie MATSU, MD   6 Units at 07/21/24 1228   insulin  glargine (LANTUS ) injection 26 Units  26 Units Subcutaneous Daily Will Almarie MATSU, MD       ipratropium-albuterol  (DUONEB) 0.5-2.5 (3) MG/3ML nebulizer solution 3 mL  3 mL Inhalation Q6H PRN Tobie Jorie SAUNDERS, MD       ipratropium-albuterol  (DUONEB) 0.5-2.5 (3) MG/3ML nebulizer solution 3 mL  3 mL Nebulization TID Will Almarie MATSU, MD  3 mL at 07/22/24 0902   levothyroxine  (SYNTHROID ) tablet 100 mcg  100 mcg Oral QHS Patel, [name] R, MD   100 mcg at 07/21/24 2152   melatonin tablet 3 mg  3 mg Oral QHS PRN Patel, [name] R, MD       ondansetron  (ZOFRAN ) tablet 4 mg  4 mg Oral Q6H PRN Patel, [name] R, MD   4 mg at 07/19/24 0005   Or   ondansetron  (ZOFRAN ) injection 4 mg  4 mg Intravenous Q6H PRN Patel, [name] R, MD   4 mg at  07/21/24 1227   Oral care mouth rinse  15 mL Mouth Rinse PRN Will Almarie MATSU, MD       pantoprazole  (PROTONIX ) EC tablet 40 mg  40 mg Oral QHS Patel, [name] R, MD   40 mg at 07/21/24 2150   piperacillin -tazobactam (ZOSYN ) IVPB 3.375 g  3.375 g Intravenous Q8H Poindexter, Leann T, RPH 12.5 mL/hr at 07/22/24 0622 3.375 g at 07/22/24 0622   promethazine  (PHENERGAN ) 12.5 mg in sodium chloride  0.9 % 50 mL IVPB  12.5 mg Intravenous Q6H PRN Will Almarie MATSU, MD 150 mL/hr at 07/22/24 0427 12.5 mg at 07/22/24 0427   senna-docusate (Senokot-S) tablet 1 tablet  1 tablet Oral QHS PRN Tobie Jorie SAUNDERS, MD       simvastatin  (ZOCOR ) tablet 40 mg  40 mg Oral QHS Tobie Jorie R, MD   40 mg at 07/21/24 2151   sodium chloride  flush (NS) 0.9 % injection 3 mL  3 mL Intravenous Q12H Tobie Jorie R, MD   3 mL at 07/21/24 2152   tamsulosin  (FLOMAX ) capsule 0.4 mg  0.4 mg Oral QHS Patel, [name] R, MD   0.4 mg at 07/21/24 2151   traZODone  (DESYREL ) tablet 150 mg  150 mg Oral QHS Patel, [name] R, MD   150 mg at 07/21/24 2151    Allergies as of 07/18/2024 - Review Complete 07/18/2024  Allergen Reaction Noted   Augmentin  [amoxicillin -pot clavulanate] Nausea And Vomiting 01/21/2016   Ivp dye [iodinated contrast media] Other (See Comments) 02/01/2016   Lexapro [escitalopram oxalate] Itching 04/11/2013     Review of Systems:    As per HPI, otherwise negative    Physical Exam:  Vital signs in last 24 hours: Temp:  [97.7 F (36.5 C)-98.3 F (36.8 C)] 97.7 F (36.5 C) (01/04 0412) Pulse Rate:  [83-96] 96 (01/04 0412) Resp:  [19-20] 20 (01/04 0412) BP: (146-152)/(63-64) 152/64 (01/04 0412) SpO2:  [94 %-98 %] 94 % (01/04 0902) FiO2 (%):  [28 %] 28 % (01/04 0902) Last BM Date : 07/19/24 General:   Pleasant male in NAD.  Nasal cannula in place Lungs: Coarse breath sounds bilaterally.   Heart:  Regular rate and rhythm; no MRG Abdomen:  Soft, nondistended, nontender. Normal bowel sounds. No appreciable  masses or hepatomegaly.  Psych:  Alert and cooperative. Normal affect.  LAB RESULTS: Recent Labs    07/22/24 0435  WBC 12.2*  HGB 10.4*  HCT 31.3*  PLT 365   BMET Recent Labs    07/22/24 0435  NA 137  K 4.8  CL 98  CO2 26  GLUCOSE 236*  BUN 8  CREATININE 0.70  CALCIUM 9.4   LFT No results for input(s): PROT, ALBUMIN , AST, ALT, ALKPHOS, BILITOT, BILIDIR, IBILI in the last 72 hours. PT/INR No results for input(s): LABPROT, INR in the last 72 hours.  STUDIES: No results found.   PREVIOUS ENDOSCOPIES:            -  12/2010: Colonoscopy: Normal.  Random biopsies negative for microscopic colitis   Impression / Plan:   1) Chronic nausea 2) Weight loss 3) Early satiety 4) Decreased appetite Patient has multiple potential etiologies for chronic nausea, to include chronic pain medications, multiple recent hospitalizations with overall declining pulmonary function, polypharmacy.  However, with his early satiety and unintentional weight loss, certainly warrants further evaluation. - CT abdomen/pelvis today - Depending on CT findings, likely plan for EGD later during this hospital course when pulmonary status allows - Continue Phenergan   5) CAP 6) COPD 7) Chronic respiratory failure with hypoxia - Management per primary Hospitalist service - Any endoscopic evaluation would need to be done in the hospital setting given degree of underlying pulmonary disease  Dr. Abran will assume his ongoing inpatient GI care starting tomorrow, 07/23/2024.   LOS: 4 days   Sandor LULLA Flatter  07/22/2024, 10:22 AM  [facility]  A total of 75 minutes of time was spent on this encounter, including in depth chart review, independent review of results as outlined above, communicating results with the patient directly, face-to-face time with the patient, coordinating care, and ordering studies and medications as appropriate, and documentation.       [1]  Social  History Tobacco Use   Smoking status: Former    Current packs/[place]: 0.00    Average packs/[place]: 1 pack/Nephew for 40.0 years (40.0 ttl pk-yrs)    Types: Cigarettes    Start date: 08/20/1979    Quit date: 08/20/2019    Years since quitting: 4.9    Passive exposure: Past   Smokeless tobacco: Never   Tobacco comments:    Successfully quit smoking 08/20/2019  Vaping Use   Vaping status: Never Used  Substance Use Topics   Alcohol  use: No   Drug use: No   "

## 2024-07-22 NOTE — Progress Notes (Signed)
 " PROGRESS NOTE    James [name]  FMW:[id number] DOB: 1956-02-07 DOA: 07/18/2024 PCP: Nichole Senior, MD   Brief Narrative: 69 y.o. male with medical history significant for COPD/chronic asthmatic bronchitis and bronchiectasis with chronic respiratory failure with hypoxia on 2 L O2 [place], CAD, T1DM, HTN, HLD, hypothyroidism, BPH, depression/anxiety, chronic pain who presented to the ED for evaluation of shortness of breath.   Patient has a history of recurrent pneumonia as well as aspiration since prior cervical fusion.  He was last admitted September 2025 with acute hypoxic respiratory failure secondary to right-sided pneumonia and COPD exacerbation.  Since then he has been requiring 2 L supplemental O2 via [place] at baseline.  He had initial improvement for the first 2 weeks after discharge but since then has had progressively worsening dyspnea on exertion and cough.   He was seen in his pulmonology office earlier this month and diagnosed with right upper lobe pneumonia seen on chest x-ray 06/25/2024.  He completed in 1 week course of Levaquin .  He has not had any significant improvement.  He is still easily short of breath with minimal exertion.  He has frequent cough, mostly productive of clear sputum and occasionally sees follow-up green sputum.  He has been generally fatigued.  He reports frequent nausea without emesis.  Appetite has been low.  He reports approximately 35 pound weight loss since September.  He has occasional chills without fever or diaphoresis.  He has been having intermittent right upper chest pain, worse with deep inspiration.  Blood cultures in process.  SARS-CoV-2, influenza, RSV PCR negative.  Portable chest x-ray showed worsening right upper lobe pneumonia when compared to previous CXR 06/25/2024.  Assessment & Plan:   Principal Problem:   Sepsis due to pneumonia [facility]) Active Problems:   HLD (hyperlipidemia)   CAD (coronary artery disease)   Chronic asthmatic bronchitis (HCC)    DM type 1 (diabetes mellitus, type 1) (HCC)   Community acquired pneumonia of right upper lobe of lung   Benign prostatic hyperplasia with lower urinary tract symptoms   Hypothyroidism   COPD (chronic obstructive pulmonary disease) (HCC)   Hypertension associated with diabetes (HCC)   Chronic respiratory failure with hypoxia (HCC)   Nausea   Loss of weight   Early satiety   Decreased appetite   Community acquired pneumonia of right lung   Sepsis due to community acquired pneumonia of the right upper lobe of lung: Patient admitted with failed outpatient antibiotic treatment for pneumonia.  Since September when he was hospitalized he was discharged on oxygen  and remained on oxygen .  However he also remains short of breath with coughing.  He was treated with Levaquin  as an outpatient without improvement.  He was found to be tachycardic tachypneic with leukocytosis with endorgan damage such as right upper lobe pneumonia.  He has been started on Zosyn  and azithromycin  which we will continue.  Influenza RSV and COVID, strep pneumo antigen negative.  Encouraged him to use incentive spirometer.  Encourage out of bed ambulate.  Discussed with nursing staff.  WBCs trending down follow-up labs tomorrow. CT chest 07/19/2024 Right upper lobe patchy consolidative airspace opacities, most consistent with pneumonia. Upper and lower lobe mucous plugging, including within the distal right main bronchus. Recommend follow-up CT in 3 months to evaluate for complete resolution.Enlarged mediastinal lymph nodes, including a 1.3 cm precarinal lymph node. No gross hilar adenopathy with limited evaluation on this noncontrast study.Nonobstructive bilateral nephrolithiasis. Continue  hypertonic saline nebs ,encourage incentive spirometer and  flutter valve, DuoNebs 4 times daily and chest vest therapy by RT.  Nausea patient has been complaining of persistent nausea early satiety and 35 pound weight loss in the last 4 months.   Appreciate Dr. San seeing him.  A CT of the abdomen was ordered but unfortunately due to iodine allergy we did not give him any p.o. or IV contrast.  Possible EGD next week.   COPD/chronic asthmatic bronchitis/bronchiectasis: Does not appear to have acute COPD/asthma exacerbation on 3 L of oxygen . Continue antibiotics and pulmonary hygiene as above   Chronic respiratory failure with hypoxia: Currently stable on home 2 L supplemental O2 [place].   Type 1 diabetes:CBG (last 3)  Recent Labs    07/21/24 2118 07/22/24 0807 07/22/24 1159  GLUCAP 217* 271* 246*  On Semglee  20 units with sensitive SSI.  Coronary artery disease: Continue aspirin  and simvastatin .   Hypertension: BP stable, does not appear to be on antihypertensives as an outpatient.   Hyperlipidemia: Continue simvastatin .   Hypothyroidism: Continue Synthroid .   Depression/anxiety: Continue trazodone .   BPH: Continue Flomax  and Proscar .   Chronic pain syndrome: Continue home Norco 5-325 mg 3 times daily prn.   Estimated body mass index is 22.89 kg/m as calculated from the following:   Height as of this encounter: 5' 9 (1.753 m).   Weight as of this encounter: 70.3 kg.  DVT prophylaxis: lovenox  Code Status: full Family Communication: none Disposition Plan:  Status is: Inpatient   Consultants:  none  Procedures: none Antimicrobials: Anti-infectives (From admission, onward)    Start     Dose/Rate Route Frequency Ordered Stop   07/19/24 2200  azithromycin  (ZITHROMAX ) 500 mg in sodium chloride  0.9 % 250 mL IVPB        500 mg 250 mL/hr over 60 Minutes Intravenous Every 24 hours 07/18/24 2236 07/24/24 2159   07/19/24 2000  cefTRIAXone  (ROCEPHIN ) 2 g in sodium chloride  0.9 % 100 mL IVPB  Status:  Discontinued        2 g 200 mL/hr over 30 Minutes Intravenous Every 24 hours 07/18/24 2236 07/18/24 2239   07/18/24 2300  piperacillin -tazobactam (ZOSYN ) IVPB 3.375 g        3.375 g 12.5 mL/hr over 240  Minutes Intravenous Every 8 hours 07/18/24 2246     07/18/24 2015  cefTRIAXone  (ROCEPHIN ) 2 g in sodium chloride  0.9 % 100 mL IVPB  Status:  Discontinued        2 g 200 mL/hr over 30 Minutes Intravenous Once 07/18/24 2011 07/18/24 2015   07/18/24 2015  azithromycin  (ZITHROMAX ) 500 mg in sodium chloride  0.9 % 250 mL IVPB  Status:  Discontinued        500 mg 250 mL/hr over 60 Minutes Intravenous  Once 07/18/24 2011 07/18/24 2015   07/18/24 2000  cefTRIAXone  (ROCEPHIN ) 1 g in sodium chloride  0.9 % 100 mL IVPB        1 g 200 mL/hr over 30 Minutes Intravenous  Once 07/18/24 1953 07/18/24 2101   07/18/24 2000  azithromycin  (ZITHROMAX ) 500 mg in sodium chloride  0.9 % 250 mL IVPB        500 mg 250 mL/hr over 60 Minutes Intravenous  Once 07/18/24 1953 07/18/24 2223        Subjective: Breathing and cough improved since yesterday.  Reports of 35 pound weight loss and persistent nausea and lack of appetite and cannot tolerate more than 2 bites of food  Objective: Vitals:   07/21/24 1341 07/21/24 2026 07/22/24 0412 07/22/24  0902  BP: (!) 146/63  (!) 152/64   Pulse: 83  96   Resp: 19  20   Temp: 98.3 F (36.8 C)  97.7 F (36.5 C)   TempSrc: Oral  Oral   SpO2: 97% 98% 95% 94%  Weight:      Height:        Intake/Output Summary (Last 24 hours) at 07/22/2024 1215 Last data filed at 07/22/2024 9377 Gross per 24 hour  Intake 936.5 ml  Output --  Net 936.5 ml   Filed Weights   07/18/24 1955  Weight: 70.3 kg    Examination:  General exam: Appears calm and comfortable  Respiratory system: Rhonchi right more than left. Respiratory effort normal. Cardiovascular system: Regular Gastrointestinal system: Abdomen is nondistended, soft and nontender. No organomegaly or masses felt. Normal bowel sounds heard. Central nervous system: Alert and oriented. No focal neurological deficits. Extremities: No edema  Data Reviewed: I have personally reviewed following labs and imaging  studies  CBC: Recent Labs  Lab 07/18/24 2030 07/19/24 0259 07/22/24 0435  WBC 16.6* 13.4* 12.2*  NEUTROABS 14.4*  --   --   HGB 12.1* 9.8* 10.4*  HCT 35.8* 29.3* 31.3*  MCV 90.2 90.7 89.7  PLT 489* 371 365   Basic Metabolic Panel: Recent Labs  Lab 07/18/24 2030 07/19/24 0259 07/22/24 0435  NA 132* 132* 137  K 4.5 4.3 4.8  CL 94* 97* 98  CO2 23 25 26   GLUCOSE 240* 149* 236*  BUN 15 13 8   CREATININE 0.80 0.68 0.70  CALCIUM 9.6 8.8* 9.4   GFR: Estimated Creatinine Clearance: 86.7 mL/min (by C-G formula based on SCr of 0.7 mg/dL). Liver Function Tests: Recent Labs  Lab 07/18/24 2030  AST 12*  ALT 6  ALKPHOS 171*  BILITOT 0.6  PROT 8.2*  ALBUMIN  3.6   No results for input(s): LIPASE, AMYLASE in the last 168 hours. No results for input(s): AMMONIA in the last 168 hours. Coagulation Profile: Recent Labs  Lab 07/18/24 2030  INR 1.1   Cardiac Enzymes: No results for input(s): CKTOTAL, CKMB, CKMBINDEX, TROPONINI in the last 168 hours. BNP (last 3 results) Recent Labs    07/18/24 2030  PROBNP 190.0   HbA1C: No results for input(s): HGBA1C in the last 72 hours. CBG: Recent Labs  Lab 07/21/24 1127 07/21/24 1654 07/21/24 2118 07/22/24 0807 07/22/24 1159  GLUCAP 233* 133* 217* 271* 246*   Lipid Profile: No results for input(s): CHOL, HDL, LDLCALC, TRIG, CHOLHDL, LDLDIRECT in the last 72 hours. Thyroid  Function Tests: No results for input(s): TSH, T4TOTAL, FREET4, T3FREE, THYROIDAB in the last 72 hours. Anemia Panel: No results for input(s): VITAMINB12, FOLATE, FERRITIN, TIBC, IRON, RETICCTPCT in the last 72 hours. Sepsis Labs: Recent Labs  Lab 07/18/24 2037  LATICACIDVEN 1.7    Recent Results (from the past 240 hours)  Culture, blood (routine x 2)     Status: None (Preliminary result)   Collection Time: 07/18/24  8:30 PM   Specimen: BLOOD LEFT ARM  Result Value Ref Range Status   Specimen  Description   Final    BLOOD LEFT ARM Performed at [facility], [address], [place], [place] [address]    Special Requests   Final    BOTTLES DRAWN AEROBIC AND ANAEROBIC Blood Culture adequate volume Performed at [facility], [address], [place], [place] [address]    Culture   Final    NO GROWTH 3 DAYS Performed at [facility], [address] [place], [place] [address]    Report Status PENDING  Incomplete  Resp panel by RT-PCR (RSV, Flu A&B, Covid) Anterior Nasal Swab     Status: None   Collection Time: 07/18/24  8:37 PM   Specimen: Anterior Nasal Swab  Result Value Ref Range Status   SARS Coronavirus 2 by RT PCR NEGATIVE NEGATIVE Final    Comment: (NOTE) SARS-CoV-2 target nucleic acids are NOT DETECTED.  The SARS-CoV-2 RNA is generally detectable in upper respiratory specimens during the acute phase of infection. The lowest concentration of SARS-CoV-2 viral copies this assay can detect is 138 copies/mL. A negative result does not preclude SARS-Cov-2 infection and should not be used as the sole basis for treatment or other patient management decisions. A negative result may occur with  improper specimen collection/handling, submission of specimen other than nasopharyngeal swab, presence of viral mutation(s) within the areas targeted by this assay, and inadequate number of viral copies(<138 copies/mL). A negative result must be combined with clinical observations, patient history, and epidemiological information. The expected result is Negative.  Fact Sheet for Patients:  bloggercourse.com  Fact Sheet for Healthcare Providers:  seriousbroker.it  This test is no t yet approved or cleared by the [place]  FDA and  has been authorized for detection and/or diagnosis of SARS-CoV-2 by FDA under an Emergency Use Authorization (EUA). This EUA will remain  in effect (meaning this test can  be used) for the duration of the COVID-19 declaration under Section 564(b)(1) of the Act, 21 U.S.C.section 360bbb-3(b)(1), unless the authorization is terminated  or revoked sooner.       Influenza A by PCR NEGATIVE NEGATIVE Final   Influenza B by PCR NEGATIVE NEGATIVE Final    Comment: (NOTE) The Xpert Xpress SARS-CoV-2/FLU/RSV plus assay is intended as an aid in the diagnosis of influenza from Nasopharyngeal swab specimens and should not be used as a sole basis for treatment. Nasal washings and aspirates are unacceptable for Xpert Xpress SARS-CoV-2/FLU/RSV testing.  Fact Sheet for Patients: bloggercourse.com  Fact Sheet for Healthcare Providers: seriousbroker.it  This test is not yet approved or cleared by the [place]  FDA and has been authorized for detection and/or diagnosis of SARS-CoV-2 by FDA under an Emergency Use Authorization (EUA). This EUA will remain in effect (meaning this test can be used) for the duration of the COVID-19 declaration under Section 564(b)(1) of the Act, 21 U.S.C. section 360bbb-3(b)(1), unless the authorization is terminated or revoked.     Resp Syncytial Virus by PCR NEGATIVE NEGATIVE Final    Comment: (NOTE) Fact Sheet for Patients: bloggercourse.com  Fact Sheet for Healthcare Providers: seriousbroker.it  This test is not yet approved or cleared by the [place]  FDA and has been authorized for detection and/or diagnosis of SARS-CoV-2 by FDA under an Emergency Use Authorization (EUA). This EUA will remain in effect (meaning this test can be used) for the duration of the COVID-19 declaration under Section 564(b)(1) of the Act, 21 U.S.C. section 360bbb-3(b)(1), unless the authorization is terminated or revoked.  Performed at [facility], [address], [place], [place] [address]   Expectorated Sputum Assessment  w Gram Stain, Rflx to Resp Cult     Status: None   Collection Time: 07/19/24  2:00 PM   Specimen: Expectorated Sputum  Result Value Ref Range Status   Specimen Description EXPECTORATED SPUTUM  Final   Special Requests NONE  Final   Sputum evaluation   Final    THIS  SPECIMEN IS ACCEPTABLE FOR SPUTUM CULTURE Performed at [facility], [address], [place], [place] [address]    Report Status 07/19/2024 FINAL  Final  Culture, Respiratory w Gram Stain     Status: None (Preliminary result)   Collection Time: 07/19/24  2:00 PM  Result Value Ref Range Status   Specimen Description   Final    EXPECTORATED SPUTUM Performed at [facility], [address], [place], [place] [address]    Special Requests   Final    NONE Reflexed from [phone] Performed at [facility], [address], [place], [place] [address]    Gram Stain   Final    ABUNDANT WBC PRESENT, PREDOMINANTLY PMN MODERATE GRAM POSITIVE COCCI RARE GRAM POSITIVE RODS    Culture   Final    MODERATE Normal respiratory flora-no Staph aureus or Pseudomonas seen Performed at [facility], [address] [place], [place] [address]    Report Status PENDING  Incomplete  MRSA Next Gen by PCR, Nasal     Status: None   Collection Time: 07/19/24  3:00 PM   Specimen: Nasal Mucosa; Nasal Swab  Result Value Ref Range Status   MRSA by PCR Next Gen NOT DETECTED NOT DETECTED Final    Comment: (NOTE) The GeneXpert MRSA Assay (FDA approved for NASAL specimens only), is one component of a comprehensive MRSA colonization surveillance program. It is not intended to diagnose MRSA infection nor to guide or monitor treatment for MRSA infections. Test performance is not FDA approved in patients less than 83 years old. Performed at [facility], [address], [place], [place] [address]   Culture, blood (routine x 2)     Status: None (Preliminary result)   Collection  Time: 07/19/24  3:22 PM   Specimen: BLOOD RIGHT ARM  Result Value Ref Range Status   Specimen Description   Final    BLOOD RIGHT ARM Performed at [facility], [address], [place], [place] [address]    Special Requests   Final    BOTTLES DRAWN AEROBIC ONLY Blood Culture adequate volume Performed at [facility], [address] [place], [place] [address]    Culture   Final    NO GROWTH 3 DAYS Performed at [facility], [address], [place], [place] [address]    Report Status PENDING  Incomplete         Radiology Studies: No results found.    Scheduled Meds:  arformoterol   15 mcg Nebulization BID   aspirin  EC  81 mg Oral QHS   budesonide  (PULMICORT ) nebulizer solution  0.25 mg Nebulization BID   enoxaparin  (LOVENOX ) injection  40 mg Subcutaneous Q24H   feeding supplement  237 mL Oral BID BM   finasteride   5 mg Oral QHS   guaiFENesin   600 mg Oral BID   HYDROcodone -acetaminophen   1 tablet Oral TID   insulin  aspart  0-5 Units Subcutaneous QHS   insulin  aspart  0-9 Units Subcutaneous TID WC   insulin  aspart  6 Units Subcutaneous TID WC   insulin  glargine  26 Units Subcutaneous Daily   ipratropium-albuterol   3 mL Nebulization TID   levothyroxine   100 mcg Oral QHS   pantoprazole   40 mg Oral QHS   simvastatin   40 mg Oral QHS   sodium chloride  flush  3 mL Intravenous Q12H   tamsulosin   0.4 mg Oral QHS   traZODone   150 mg Oral  QHS   Continuous Infusions:  azithromycin  500 mg (07/21/24 2158)   piperacillin -tazobactam (ZOSYN )  IV 3.375 g (07/22/24 0622)   promethazine  (PHENERGAN ) injection (IM or IVPB) 12.5 mg (07/22/24 1145)     LOS: 4 days     Almarie KANDICE Hoots, MD  07/22/2024, 12:15 PM   "

## 2024-07-23 ENCOUNTER — Ambulatory Visit (HOSPITAL_BASED_OUTPATIENT_CLINIC_OR_DEPARTMENT_OTHER)

## 2024-07-23 ENCOUNTER — Other Ambulatory Visit (HOSPITAL_COMMUNITY): Payer: Self-pay

## 2024-07-23 DIAGNOSIS — R110 Nausea: Secondary | ICD-10-CM | POA: Diagnosis not present

## 2024-07-23 DIAGNOSIS — K5900 Constipation, unspecified: Secondary | ICD-10-CM

## 2024-07-23 DIAGNOSIS — A419 Sepsis, unspecified organism: Secondary | ICD-10-CM | POA: Diagnosis not present

## 2024-07-23 DIAGNOSIS — J189 Pneumonia, unspecified organism: Secondary | ICD-10-CM | POA: Diagnosis not present

## 2024-07-23 DIAGNOSIS — D649 Anemia, unspecified: Secondary | ICD-10-CM

## 2024-07-23 DIAGNOSIS — R6881 Early satiety: Secondary | ICD-10-CM | POA: Diagnosis not present

## 2024-07-23 LAB — CBC
HCT: 29.3 % — ABNORMAL LOW (ref 39.0–52.0)
Hemoglobin: 10.1 g/dL — ABNORMAL LOW (ref 13.0–17.0)
MCH: 30.5 pg (ref 26.0–34.0)
MCHC: 34.5 g/dL (ref 30.0–36.0)
MCV: 88.5 fL (ref 80.0–100.0)
Platelets: 357 K/uL (ref 150–400)
RBC: 3.31 MIL/uL — ABNORMAL LOW (ref 4.22–5.81)
RDW: 12.1 % (ref 11.5–15.5)
WBC: 9 K/uL (ref 4.0–10.5)
nRBC: 0 % (ref 0.0–0.2)

## 2024-07-23 LAB — BASIC METABOLIC PANEL WITH GFR
Anion gap: 10 (ref 5–15)
BUN: 9 mg/dL (ref 8–23)
CO2: 28 mmol/L (ref 22–32)
Calcium: 8.9 mg/dL (ref 8.9–10.3)
Chloride: 98 mmol/L (ref 98–111)
Creatinine, Ser: 0.62 mg/dL (ref 0.61–1.24)
GFR, Estimated: >60 mL/min
Glucose, Bld: 224 mg/dL — ABNORMAL HIGH (ref 70–99)
Potassium: 4 mmol/L (ref 3.5–5.1)
Sodium: 136 mmol/L (ref 135–145)

## 2024-07-23 LAB — GLUCOSE, CAPILLARY
Glucose-Capillary: 143 mg/dL — ABNORMAL HIGH (ref 70–99)
Glucose-Capillary: 189 mg/dL — ABNORMAL HIGH (ref 70–99)
Glucose-Capillary: 118 mg/dL — ABNORMAL HIGH (ref 70–99)
Glucose-Capillary: 120 mg/dL — ABNORMAL HIGH (ref 70–99)

## 2024-07-23 MED ORDER — DIPHENHYDRAMINE-ZINC ACETATE 2-0.1 % EX CREA: TOPICAL_CREAM | Freq: Three times a day (TID) | CUTANEOUS | Status: DC | PRN

## 2024-07-23 NOTE — Progress Notes (Signed)
 Initial Nutrition Assessment  DOCUMENTATION CODES:   Severe malnutrition in context of chronic illness  INTERVENTION:  - Liberalize diet to Carb Modified to avoid restricting patient's intake.  - Ensure Plus High Protein po BID, each supplement provides 350 kcal and 20 grams of protein. - Encourage intake at all meals and of supplements.  - Multivitamin with minerals daily.  - Monitor weight trends.   NUTRITION DIAGNOSIS:   Severe Malnutrition related to chronic illness as evidenced by mild fat depletion, severe muscle depletion, percent weight loss (10.7% in 5 months).  GOAL:   Patient will meet greater than or equal to 90% of their needs  MONITOR:   PO intake, Supplement acceptance, Weight trends  REASON FOR ASSESSMENT:   Malnutrition Screening Tool    ASSESSMENT:   69 y.o. male with PMH significant for COPD/chronic asthmatic bronchitis and bronchiectasis with chronic respiratory failure with hypoxia on 2 L O2 [place], CAD, T1DM, HTN, HLD, hypothyroidism, BPH, depression/anxiety, chronic pain who presented for SOB. Admitted for sepsis d/t CAP.  Patient reports a UBW of 184# he weighed at his heaviest and weight loss over the past 4 months due to lack of appetite.  Per EMR, patient weighed at 173# in August and now weighed at 155#. This is an 18# or 10.7% weight loss in 5 months, which is significant for the time frame.   Prior to 4 months ago, patient reports eating well with 3 meals a [place]. Over the past 4 months, he has decreased to only 2 meals a [place]. Appetite has been very poor. He also noted intermittent swallowing difficulty, however denies he has had any issues during admission.  Patient reports he hasn't been eating well this admission due to ongoing issues as above. Documented to be consuming 0-40% of meals. Patient is also noted to have a history of gastroparesis and is currently dealing with ongoing nausea which hinders intake. He is currently on a heavy bowel regimen  and reglan  as well as prn phenergan .   Thankfully, he reports he still orders 3 meals a [place] and eats what he can of them. Encouraged patient to continue to do this. He is also noted to have been on a heart healthy/carb modified diet. Will liberalize to carb modified to avoid restricting intake given poor PO.   Patient ordered Ensure BID however he states he hasn't really received any since admission. Per the MAR, he has been receiving Ensure and accepting them 1-2/Uber. He notes he only likes chocolate and unfortunately there is no chocolate available at this time. Encouraged patient to try and consume them to support oral intake.   Medications reviewed and include: Dulcolax, Reglan , Protonix , Miralax , Senokot, Phenergan  prn  Labs reviewed:  HA1C 6.7  Blood Glucose 104-271 x24 hours  NUTRITION - FOCUSED PHYSICAL EXAM:  Flowsheet Row Most Recent Value  Orbital Region Mild depletion  Upper Arm Region Severe depletion  Thoracic and Lumbar Region Mild depletion  Buccal Region Mild depletion  Temple Region Moderate depletion  Clavicle Bone Region Moderate depletion  Clavicle and Acromion Bone Region Moderate depletion  Scapular Bone Region Unable to assess  Dorsal Hand Moderate depletion  Patellar Region Severe depletion  Anterior Thigh Region Severe depletion  Posterior Calf Region Severe depletion  Edema (RD Assessment) None  Hair Reviewed  Eyes Reviewed  Mouth Reviewed  Skin Reviewed  Nails Reviewed    Diet Order:   Diet Order             Diet Carb  Modified Room service appropriate? Yes  Diet effective now                   EDUCATION NEEDS:  Education needs have been addressed  Skin:  Skin Assessment: Reviewed RN Assessment  Last BM:  1/1  Height:  Ht Readings from Last 1 Encounters:  07/19/24 5' 9 (1.753 m)   Weight:  Wt Readings from Last 1 Encounters:  07/18/24 70.3 kg    BMI:  Body mass index is 22.89 kg/m.  Estimated Nutritional Needs:  Kcal:   1900-2100 kcals Protein:  90-105 grams Fluid:  >/= 1.9L    Trude Ned RD, LDN Contact via Secure Chat.

## 2024-07-23 NOTE — Progress Notes (Signed)
 PT Cancellation Note  Patient Details Name: James [name] Peed MRN: [id number] DOB: 03/30/1956   Cancelled Treatment:    Reason Eval/Treat Not Completed: Other (comment). Pt supine in bed, spouse at bedside reports pt just washed up. Spouse, who is an [employer] reports pt desat to 89% on RA, spouse returned O2 and Spo2 96%. Pt reports fatigued from wash up, requesting therapist check back at later time. Will check back as schedule permits.    Metta Ave PT, DPT 07/23/2024, 2:12 PM

## 2024-07-23 NOTE — Progress Notes (Addendum)
 "    [facility] Progress Note  CC: Chronic, weight loss  Subjective: Patient continues to have a decreased appetite with early satiety.  Nausea is currently well-controlled as long as he receives Promethazine .  No vomiting.  He endorsed having nausea for the past 2 to 3 years which has worsened over the past 4 to 6 months.  No abdominal pain.  He chronically takes hydrocodone  twice daily at home for chronic neck and back pain.  Wife at the bedside.   Objective:  Vital signs in last 24 hours: Temp:  [97.7 F (36.5 C)-98.4 F (36.9 C)] 97.7 F (36.5 C) (01/05 1324) Pulse Rate:  [64-86] 64 (01/05 1324) Resp:  [16-19] 16 (01/05 1324) BP: (107-157)/(58-69) 123/67 (01/05 1324) SpO2:  [94 %-97 %] 97 % (01/05 1324) FiO2 (%):  [28 %] 28 % (01/04 1424) Last BM Date : 07/19/24 General: Alert chronically ill-appearing 69 year old male in no acute distress. Heart: Regular rate and rhythm, no murmurs. Pulm: Breath sounds coarse with scattered wheezes, patient developed significant coughing with near bronchospasms during exam which quickly subsided. Patient endorsed producing green sputum.  On oxygen  2 L nasal cannula. Abdomen: Soft, nondistended.  Nontender.  Positive bowel sounds to all 4 quadrants.  No palpable mass. Extremities: No lower extremity edema. Neurologic:  Alert and oriented x 4. Grossly normal neurologically. Psych:  Alert and cooperative. Normal mood and affect.  Intake/Output from previous [facility]: 01/04 0701 - 01/05 0700 In: 590 [P.O.:240; IV Piggyback:350] Out: -  Intake/Output this shift: No intake/output data recorded.  Lab Results: Recent Labs    07/22/24 0435 07/23/24 1108  WBC 12.2* 9.0  HGB 10.4* 10.1*  HCT 31.3* 29.3*  PLT 365 357   BMET Recent Labs    07/22/24 0435 07/23/24 1108  NA 137 136  K 4.8 4.0  CL 98 98  CO2 26 28  GLUCOSE 236* 224*  BUN 8 9  CREATININE 0.70 0.62  CALCIUM 9.4 8.9   LFT No results for input(s): PROT,  ALBUMIN , AST, ALT, ALKPHOS, BILITOT, BILIDIR, IBILI in the last 72 hours. PT/INR No results for input(s): LABPROT, INR in the last 72 hours. Hepatitis Panel No results for input(s): HEPBSAG, HCVAB, HEPAIGM, HEPBIGM in the last 72 hours.  CT ABDOMEN PELVIS WO CONTRAST Result Date: 07/22/2024 CLINICAL DATA:  Weight loss and nausea EXAM: CT ABDOMEN AND PELVIS WITHOUT CONTRAST TECHNIQUE: Multidetector CT imaging of the abdomen and pelvis was performed following the standard protocol without IV contrast. RADIATION DOSE REDUCTION: This exam was performed according to the departmental dose-optimization program which includes automated exposure control, adjustment of the mA and/or kV according to patient size and/or use of iterative reconstruction technique. COMPARISON:  CT chest dated 07/19/2024, CT abdomen and pelvis dated 12/14/2012 FINDINGS: Lower chest: Increased right lower lobe tree-in-bud nodules. Partially imaged right lower lobe subsegmental mucous plugging. Left lower lobe linear atelectasis/scarring. Increased small right pleural effusion. Partially imaged heart size is normal. Hepatobiliary: No focal hepatic lesions. No intra or extrahepatic biliary ductal dilation. Normal gallbladder. Pancreas: No focal lesions or main ductal dilation. Spleen: Normal in size.  Scattered calcified splenic granulomata. Adrenals/Urinary Tract: No adrenal nodules. Duplex left kidney. No suspicious renal masses by noncontrast technique. No hydronephrosis. Bilateral nonobstructing stones measuring up to 4 mm in the left upper lobe. No focal bladder wall thickening. Stomach/Bowel: Normal appearance of the stomach. No evidence of bowel wall thickening, distention, or inflammatory changes. Moderate volume stool throughout the colon. Colonic diverticulosis without acute diverticulitis. Appendix is  not discretely seen. Vascular/Lymphatic: Aortic atherosclerosis. No enlarged abdominal or pelvic lymph nodes.  Reproductive: Prostate is unremarkable. Other: No free fluid, fluid collection, or free air. Musculoskeletal: No acute or abnormal lytic or blastic osseous lesions. Multilevel degenerative changes of the partially imaged thoracic and lumbar spine. Subcutaneous emphysema and soft tissue stranding in the left anterolateral lower abdominal wall, likely injection related. IMPRESSION: 1. No acute abdominopelvic abnormality. 2. Increased right lower lobe tree-in-bud nodules with partially imaged right lower lobe subsegmental mucous plugging, likely infectious/inflammatory. 3. Increased small right pleural effusion. 4. Findings of constipation with moderate volume stool throughout the colon. 5. Bilateral nonobstructing renal stones measuring up to 4 mm in the left upper lobe. 6. Colonic diverticulosis without acute diverticulitis. 7.  Aortic Atherosclerosis (ICD10-I70.0). Electronically Signed   By: Limin  Xu M.D.   On: 07/22/2024 12:52    Patient Profile: James [name] is a 70 y.o. male with a history of COPD/chronic asthmatic bronchitis and bronchiectasis with chronic respiratory failure with hypoxia on 2 L O2 [place], CAD, T1DM, HTN, HLD, hypothyroidism, BPH, depression/anxiety, chronic pain, admitted on 07/18/2024 with RUL pneumonia and sepsis.  Has a history of recurrent pneumonia and aspiration since prior cervical fusion and has been on 2 L supplemental O2 since hospital admission in 03/2024.   Assessment / Plan:  69 year old male with COPD/chronic asthmatic bronchitis and bronchiectasis with chronic respiratory failure with hypoxia on home oxygen  admitted 12/31 with sepsis due to community acquired pneumonia of the right upper lobe of lung.  On Azithromycin  IV and neb tx. WBC 9.0.  - Management per the medical service  Early satiety, chronic nausea without emesis and unintentional 35 lb weight loss x 3 to 4 months.  Reported history of chronic gastroparesis in setting of DM type I and chronic opioid use. CTAP  without contrast 1/4  did not identify any acute intra-abdominal/pelvic findings other than significant stool burden. - Question possible EGD when respiratory status stable, timing to be determined. Await further recommendations per Dr. Abran  - Continue modified carb diet  - Continue Metoclopramide  5 mg IV every 8 hours - Continue Promethazine  12.5 mg IV every 6 hours as needed - Continue Ondansetron  4 mg p.o. or IV every 4 hours as needed  Prior history of oropharyngeal dysphagia and history of aspiration on MBS on 02/14/2023 and 10/2019.  MBS during hospital admission on 04/16/2024, and swallow function appears somewhat improved compared with 01/2023.  Was evaluated by SLP on 07/20/2024.  No overt signs of aspiration or dysphagia on exam and recommended continued regular diet and thin liquids with use of aspiration precautions, mobility, oral hygiene.  Patient declined repeat MBS.   Acute on chronic normocytic anemia.  No overt GI bleeding. Hg 12.1 -> 9.8 -> 10.4 -> today 10.1.   DM type I on insulin . HgA1c 6.7 on 02/23/2024.  Constipation. CTAP showed significant stool burden.  On Dulcolax 10mg  po once daily. Received Dulcolax suppository x 1 on  1/4. - Miralax  once daily, may increase to twice daily - TSH in am   Chronic neck and back pain on Hydrocodone -Acetaminophen  3 times daily and Dilaudid  IV every 4 hours as needed  Principal Problem:   Sepsis due to pneumonia (HCC) Active Problems:   HLD (hyperlipidemia)   CAD (coronary artery disease)   Chronic asthmatic bronchitis (HCC)   DM type 1 (diabetes mellitus, type 1) (HCC)   Community acquired pneumonia of right upper lobe of lung   Benign prostatic hyperplasia with  lower urinary tract symptoms   Hypothyroidism   COPD (chronic obstructive pulmonary disease) (HCC)   Hypertension associated with diabetes (HCC)   Chronic respiratory failure with hypoxia (HCC)   Nausea   Loss of weight   Early satiety   Decreased appetite   Community  acquired pneumonia of right lung     LOS: 5 days   Elida HERO Kennedy-Smith  07/23/2024, 2:47 PM  GI ATTENDING  Interval history and data reviewed.  Case discussed with Dr. San.  Agree with interval progress note as outlined.  CT scan without significant abnormalities. This patient's longstanding chronic nausea secondary to acute on chronic illness and/or medications.  Narcotics can be particular problematic in this regard. Treatment for this problem is treating the underlying acute problems, empiric PPI, and antinausea medicines as needed.  Unfortunately, it is unlikely that his chronic problems with nausea are going to resolve. Upper endoscopy is high risk and highly likely to add nothing.  Thus, no plans for upper endoscopy. We are signing off for now, but available if needed.  He can see Dr. San as an outpatient if needed.  Norleen SAILOR. Abran Raddle., M.D. [facility] Gastroenterology    "

## 2024-07-23 NOTE — Progress Notes (Signed)
 Mobility Specialist - Progress Note  ([place] 2L) Pre-mobility: 93% SpO2 During mobility: 91% SpO2 Post-mobility: 93% SPO2   07/23/24 1546  Mobility  Activity Ambulated independently  Level of Assistance Modified independent, requires aide device or extra time  Assistive Device None  Distance Ambulated (ft) 350 ft  Range of Motion/Exercises Active  Activity Response Tolerated well  Mobility visit 1 Mobility  Mobility Specialist Start Time (ACUTE ONLY) 1530  Mobility Specialist Stop Time (ACUTE ONLY) 1546  Mobility Specialist Time Calculation (min) (ACUTE ONLY) 16 min   Pt was found in bed and agreeable to mobilize. No complaints. At EOS returned to bed with all needs met. Call bell in reach and wife in room.   Erminio Leos,  Mobility Specialist Can be reached via Secure Chat

## 2024-07-23 NOTE — Progress Notes (Signed)
 " PROGRESS NOTE    James [name]  FMW:[id number] DOB: Jul 15, 1956 DOA: 07/18/2024 PCP: Nichole Senior, MD   Brief Narrative: 69 y.o. male with medical history significant for COPD/chronic asthmatic bronchitis and bronchiectasis with chronic respiratory failure with hypoxia on 2 L O2 [place], CAD, T1DM, HTN, HLD, hypothyroidism, BPH, depression/anxiety, chronic pain who presented to the ED for evaluation of shortness of breath.   Patient has a history of recurrent pneumonia as well as aspiration since prior cervical fusion.  He was last admitted September 2025 with acute hypoxic respiratory failure secondary to right-sided pneumonia and COPD exacerbation.  Since then he has been requiring 2 L supplemental O2 via [place] at baseline.  He had initial improvement for the first 2 weeks after discharge but since then has had progressively worsening dyspnea on exertion and cough.   He was seen in his pulmonology office earlier this month and diagnosed with right upper lobe pneumonia seen on chest x-ray 06/25/2024.  He completed in 1 week course of Levaquin .  He has not had any significant improvement.  He is still easily short of breath with minimal exertion.  He has frequent cough, mostly productive of clear sputum and occasionally sees follow-up green sputum.  He has been generally fatigued.  He reports frequent nausea without emesis.  Appetite has been low.  He reports approximately 35 pound weight loss since September.  He has occasional chills without fever or diaphoresis.  He has been having intermittent right upper chest pain, worse with deep inspiration.  Blood cultures in process.  SARS-CoV-2, influenza, RSV PCR negative.  Portable chest x-ray showed worsening right upper lobe pneumonia when compared to previous CXR 06/25/2024.  Assessment & Plan:   Principal Problem:   Sepsis due to pneumonia [facility]) Active Problems:   HLD (hyperlipidemia)   CAD (coronary artery disease)   Chronic asthmatic bronchitis (HCC)    DM type 1 (diabetes mellitus, type 1) (HCC)   Community acquired pneumonia of right upper lobe of lung   Benign prostatic hyperplasia with lower urinary tract symptoms   Hypothyroidism   COPD (chronic obstructive pulmonary disease) (HCC)   Hypertension associated with diabetes (HCC)   Chronic respiratory failure with hypoxia (HCC)   Nausea   Loss of weight   Early satiety   Decreased appetite   Community acquired pneumonia of right lung   Sepsis due to community acquired pneumonia of the right upper lobe of lung: Patient admitted with failed outpatient antibiotic treatment for pneumonia.  Since September when he was hospitalized he was discharged on oxygen  and remained on oxygen .  However he also remains short of breath with coughing.  He was treated with Levaquin  as an outpatient without improvement.  He was found to be tachycardic tachypneic with leukocytosis with endorgan damage such as right upper lobe pneumonia.  He has been started on Zosyn  and azithromycin  with improvement in his symptoms oxygenation and leukocytosis.   Influenza RSV and COVID, strep pneumo antigen negative.  He was treated with hypertonic saline nebs for 3 days and he was able to bring up more phlegm with improvement in his breathing.  No DuoNebs and chest vest therapy.  Encouraged him to use incentive spirometer.   Encourage out of bed ambulate. CT chest 07/19/2024 Right upper lobe patchy consolidative airspace opacities, most consistent with pneumonia. Upper and lower lobe mucous plugging, including within the distal right main bronchus. Recommend follow-up CT in 3 months to evaluate for complete resolution.Enlarged mediastinal lymph nodes, including a 1.3  cm precarinal lymph node. No gross hilar adenopathy with limited evaluation on this noncontrast study.Nonobstructive bilateral nephrolithiasis.  Nausea in the setting of chronic gastroparesis/chronic opioid use -patient has been complaining of persistent nausea  early satiety and 35 pound weight loss in the last 4 months.  Appreciate Dr. San seeing him.  Patient has a history of chronic gastroparesis for over 20 years but this is gotten worse recently in the setting of narcotic use.   CT of the abdomen did not reveal any acute findings other than a lot of stool burden.  Stool softeners laxatives started.  COPD/chronic asthmatic bronchitis/bronchiectasis: Does not appear to have acute COPD/asthma exacerbation on 3 L of oxygen . Continue antibiotics and pulmonary hygiene as above   Chronic respiratory failure with hypoxia: Currently stable on home 2 L supplemental O2 [place].   Type 1 diabetes:CBG (last 3)  Recent Labs    07/22/24 1754 07/22/24 2136 07/23/24 0732  GLUCAP 104* 168* 143*  On Semglee  20 units with sensitive SSI.  Coronary artery disease: Continue aspirin  and simvastatin .   Hypertension: Blood pressure 157/69 not on any antihypertensives prior to admission, continue as needed hydralazine .    Hyperlipidemia: Continue simvastatin .   Hypothyroidism: Continue Synthroid .   Depression/anxiety: Continue trazodone .   BPH: Continue Flomax  and Proscar .   Chronic pain syndrome: Continue home Norco 5-325 mg 3 times daily prn.   Estimated body mass index is 22.89 kg/m as calculated from the following:   Height as of this encounter: 5' 9 (1.753 m).   Weight as of this encounter: 70.3 kg.  DVT prophylaxis: lovenox  Code Status: full Family Communication: none Disposition Plan:  Status is: Inpatient   Consultants:  none  Procedures: none Antimicrobials: Anti-infectives (From admission, onward)    Start     Dose/Rate Route Frequency Ordered Stop   07/19/24 2200  azithromycin  (ZITHROMAX ) 500 mg in sodium chloride  0.9 % 250 mL IVPB        500 mg 250 mL/hr over 60 Minutes Intravenous Every 24 hours 07/18/24 2236 07/24/24 2159   07/19/24 2000  cefTRIAXone  (ROCEPHIN ) 2 g in sodium chloride  0.9 % 100 mL IVPB  Status:   Discontinued        2 g 200 mL/hr over 30 Minutes Intravenous Every 24 hours 07/18/24 2236 07/18/24 2239   07/18/24 2300  piperacillin -tazobactam (ZOSYN ) IVPB 3.375 g        3.375 g 12.5 mL/hr over 240 Minutes Intravenous Every 8 hours 07/18/24 2246     07/18/24 2015  cefTRIAXone  (ROCEPHIN ) 2 g in sodium chloride  0.9 % 100 mL IVPB  Status:  Discontinued        2 g 200 mL/hr over 30 Minutes Intravenous Once 07/18/24 2011 07/18/24 2015   07/18/24 2015  azithromycin  (ZITHROMAX ) 500 mg in sodium chloride  0.9 % 250 mL IVPB  Status:  Discontinued        500 mg 250 mL/hr over 60 Minutes Intravenous  Once 07/18/24 2011 07/18/24 2015   07/18/24 2000  cefTRIAXone  (ROCEPHIN ) 1 g in sodium chloride  0.9 % 100 mL IVPB        1 g 200 mL/hr over 30 Minutes Intravenous  Once 07/18/24 1953 07/18/24 2101   07/18/24 2000  azithromycin  (ZITHROMAX ) 500 mg in sodium chloride  0.9 % 250 mL IVPB        500 mg 250 mL/hr over 60 Minutes Intravenous  Once 07/18/24 1953 07/18/24 2223        Subjective:  Feels better still nauseous breathing better  plan to ambulate   objective: Vitals:   07/22/24 1424 07/22/24 1957 07/23/24 0411 07/23/24 0950  BP:  137/65 (!) 157/69   Pulse:  81 71   Resp:  19 17   Temp:  97.8 F (36.6 C) 97.7 F (36.5 C)   TempSrc:  Oral Oral   SpO2: 94% 97% 94% 95%  Weight:      Height:        Intake/Output Summary (Last 24 hours) at 07/23/2024 1013 Last data filed at 07/23/2024 0543 Gross per 24 hour  Intake 590 ml  Output --  Net 590 ml   Filed Weights   07/18/24 1955  Weight: 70.3 kg    Examination:  General exam: Appears calm and comfortable  Respiratory system: Rhonchi right more than left. Respiratory effort normal. Cardiovascular system: Regular Gastrointestinal system: Abdomen is nondistended, soft and nontender. No organomegaly or masses felt. Normal bowel sounds heard. Central nervous system: Alert and oriented. No focal neurological deficits. Extremities: No  edema  Data Reviewed: I have personally reviewed following labs and imaging studies  CBC: Recent Labs  Lab 07/18/24 2030 07/19/24 0259 07/22/24 0435  WBC 16.6* 13.4* 12.2*  NEUTROABS 14.4*  --   --   HGB 12.1* 9.8* 10.4*  HCT 35.8* 29.3* 31.3*  MCV 90.2 90.7 89.7  PLT 489* 371 365   Basic Metabolic Panel: Recent Labs  Lab 07/18/24 2030 07/19/24 0259 07/22/24 0435  NA 132* 132* 137  K 4.5 4.3 4.8  CL 94* 97* 98  CO2 23 25 26   GLUCOSE 240* 149* 236*  BUN 15 13 8   CREATININE 0.80 0.68 0.70  CALCIUM 9.6 8.8* 9.4   GFR: Estimated Creatinine Clearance: 86.7 mL/min (by C-G formula based on SCr of 0.7 mg/dL). Liver Function Tests: Recent Labs  Lab 07/18/24 2030  AST 12*  ALT 6  ALKPHOS 171*  BILITOT 0.6  PROT 8.2*  ALBUMIN  3.6   No results for input(s): LIPASE, AMYLASE in the last 168 hours. No results for input(s): AMMONIA in the last 168 hours. Coagulation Profile: Recent Labs  Lab 07/18/24 2030  INR 1.1   Cardiac Enzymes: No results for input(s): CKTOTAL, CKMB, CKMBINDEX, TROPONINI in the last 168 hours. BNP (last 3 results) Recent Labs    07/18/24 2030  PROBNP 190.0   HbA1C: No results for input(s): HGBA1C in the last 72 hours. CBG: Recent Labs  Lab 07/22/24 1159 07/22/24 1702 07/22/24 1754 07/22/24 2136 07/23/24 0732  GLUCAP 246* 114* 104* 168* 143*   Lipid Profile: No results for input(s): CHOL, HDL, LDLCALC, TRIG, CHOLHDL, LDLDIRECT in the last 72 hours. Thyroid  Function Tests: No results for input(s): TSH, T4TOTAL, FREET4, T3FREE, THYROIDAB in the last 72 hours. Anemia Panel: No results for input(s): VITAMINB12, FOLATE, FERRITIN, TIBC, IRON, RETICCTPCT in the last 72 hours. Sepsis Labs: Recent Labs  Lab 07/18/24 2037  LATICACIDVEN 1.7    Recent Results (from the past 240 hours)  Culture, blood (routine x 2)     Status: None (Preliminary result)   Collection Time: 07/18/24  8:30 PM    Specimen: BLOOD LEFT ARM  Result Value Ref Range Status   Specimen Description   Final    BLOOD LEFT ARM Performed at [facility], [address], [place], [place] [address]    Special Requests   Final    BOTTLES DRAWN AEROBIC AND ANAEROBIC Blood Culture adequate volume Performed at [place] [facility], [address], [place], [place] [address]  Culture   Final    NO GROWTH 4 DAYS Performed at [facility], [address], [place], [place] [address]    Report Status PENDING  Incomplete  Resp panel by RT-PCR (RSV, Flu A&B, Covid) Anterior Nasal Swab     Status: None   Collection Time: 07/18/24  8:37 PM   Specimen: Anterior Nasal Swab  Result Value Ref Range Status   SARS Coronavirus 2 by RT PCR NEGATIVE NEGATIVE Final    Comment: (NOTE) SARS-CoV-2 target nucleic acids are NOT DETECTED.  The SARS-CoV-2 RNA is generally detectable in upper respiratory specimens during the acute phase of infection. The lowest concentration of SARS-CoV-2 viral copies this assay can detect is 138 copies/mL. A negative result does not preclude SARS-Cov-2 infection and should not be used as the sole basis for treatment or other patient management decisions. A negative result may occur with  improper specimen collection/handling, submission of specimen other than nasopharyngeal swab, presence of viral mutation(s) within the areas targeted by this assay, and inadequate number of viral copies(<138 copies/mL). A negative result must be combined with clinical observations, patient history, and epidemiological information. The expected result is Negative.  Fact Sheet for Patients:  bloggercourse.com  Fact Sheet for Healthcare Providers:  seriousbroker.it  This test is no t yet approved or cleared by the [place]  FDA and  has been authorized for detection and/or diagnosis of SARS-CoV-2 by FDA under an Emergency Use  Authorization (EUA). This EUA will remain  in effect (meaning this test can be used) for the duration of the COVID-19 declaration under Section 564(b)(1) of the Act, 21 U.S.C.section 360bbb-3(b)(1), unless the authorization is terminated  or revoked sooner.       Influenza A by PCR NEGATIVE NEGATIVE Final   Influenza B by PCR NEGATIVE NEGATIVE Final    Comment: (NOTE) The Xpert Xpress SARS-CoV-2/FLU/RSV plus assay is intended as an aid in the diagnosis of influenza from Nasopharyngeal swab specimens and should not be used as a sole basis for treatment. Nasal washings and aspirates are unacceptable for Xpert Xpress SARS-CoV-2/FLU/RSV testing.  Fact Sheet for Patients: bloggercourse.com  Fact Sheet for Healthcare Providers: seriousbroker.it  This test is not yet approved or cleared by the [place]  FDA and has been authorized for detection and/or diagnosis of SARS-CoV-2 by FDA under an Emergency Use Authorization (EUA). This EUA will remain in effect (meaning this test can be used) for the duration of the COVID-19 declaration under Section 564(b)(1) of the Act, 21 U.S.C. section 360bbb-3(b)(1), unless the authorization is terminated or revoked.     Resp Syncytial Virus by PCR NEGATIVE NEGATIVE Final    Comment: (NOTE) Fact Sheet for Patients: bloggercourse.com  Fact Sheet for Healthcare Providers: seriousbroker.it  This test is not yet approved or cleared by the [place]  FDA and has been authorized for detection and/or diagnosis of SARS-CoV-2 by FDA under an Emergency Use Authorization (EUA). This EUA will remain in effect (meaning this test can be used) for the duration of the COVID-19 declaration under Section 564(b)(1) of the Act, 21 U.S.C. section 360bbb-3(b)(1), unless the authorization is terminated or revoked.  Performed at [facility],  [address] [place], [place] [address]   Expectorated Sputum Assessment w Gram Stain, Rflx to Resp Cult     Status: None   Collection Time: 07/19/24  2:00 PM   Specimen: Expectorated Sputum  Result Value Ref Range Status   Specimen Description EXPECTORATED SPUTUM  Final   Special  Requests NONE  Final   Sputum evaluation   Final    THIS SPECIMEN IS ACCEPTABLE FOR SPUTUM CULTURE Performed at [facility], [address], [place], [place] [address]    Report Status 07/19/2024 FINAL  Final  Culture, Respiratory w Gram Stain     Status: None (Preliminary result)   Collection Time: 07/19/24  2:00 PM  Result Value Ref Range Status   Specimen Description   Final    EXPECTORATED SPUTUM Performed at [facility], [address], [place], [place] [address]    Special Requests   Final    NONE Reflexed from [id number] Performed at [facility], [address], [place], [place] [address]    Gram Stain   Final    ABUNDANT WBC PRESENT, PREDOMINANTLY PMN MODERATE GRAM POSITIVE COCCI RARE GRAM POSITIVE RODS    Culture   Final    RARE PSEUDOMONAS AERUGINOSA CULTURE REINCUBATED FOR BETTER GROWTH Performed at [facility], [address], [place], [place] [address]    Report Status PENDING  Incomplete  MRSA Next Gen by PCR, Nasal     Status: None   Collection Time: 07/19/24  3:00 PM   Specimen: Nasal Mucosa; Nasal Swab  Result Value Ref Range Status   MRSA by PCR Next Gen NOT DETECTED NOT DETECTED Final    Comment: (NOTE) The GeneXpert MRSA Assay (FDA approved for NASAL specimens only), is one component of a comprehensive MRSA colonization surveillance program. It is not intended to diagnose MRSA infection nor to guide or monitor treatment for MRSA infections. Test performance is not FDA approved in patients less than 78 years old. Performed at [facility], [address] [place], [place] [address]    Culture, blood (routine x 2)     Status: None (Preliminary result)   Collection Time: 07/19/24  3:22 PM   Specimen: BLOOD RIGHT ARM  Result Value Ref Range Status   Specimen Description   Final    BLOOD RIGHT ARM Performed at [facility], [address], [place], [place] [address]    Special Requests   Final    BOTTLES DRAWN AEROBIC ONLY Blood Culture adequate volume Performed at [facility], [address] [place], [place] [address]    Culture   Final    NO GROWTH 4 DAYS Performed at [facility], [address], [place], [place] [address]    Report Status PENDING  Incomplete         Radiology Studies: CT ABDOMEN PELVIS WO CONTRAST Result Date: 07/22/2024 CLINICAL DATA:  Weight loss and nausea EXAM: CT ABDOMEN AND PELVIS WITHOUT CONTRAST TECHNIQUE: Multidetector CT imaging of the abdomen and pelvis was performed following the standard protocol without IV contrast. RADIATION DOSE REDUCTION: This exam was performed according to the departmental dose-optimization program which includes automated exposure control, adjustment of the mA and/or kV according to patient size and/or use of iterative reconstruction technique. COMPARISON:  CT chest dated 07/19/2024, CT abdomen and pelvis dated 12/14/2012 FINDINGS: Lower chest: Increased right lower lobe tree-in-bud nodules. Partially imaged right lower lobe subsegmental mucous plugging. Left lower lobe linear atelectasis/scarring. Increased small right pleural effusion. Partially imaged heart size is normal. Hepatobiliary: No focal hepatic lesions. No intra or extrahepatic biliary ductal dilation. Normal gallbladder. Pancreas: No focal lesions or main ductal dilation. Spleen: Normal in size.  Scattered calcified splenic granulomata. Adrenals/Urinary Tract: No adrenal nodules. Duplex left kidney. No suspicious  renal masses by noncontrast technique. No hydronephrosis. Bilateral nonobstructing stones  measuring up to 4 mm in the left upper lobe. No focal bladder wall thickening. Stomach/Bowel: Normal appearance of the stomach. No evidence of bowel wall thickening, distention, or inflammatory changes. Moderate volume stool throughout the colon. Colonic diverticulosis without acute diverticulitis. Appendix is not discretely seen. Vascular/Lymphatic: Aortic atherosclerosis. No enlarged abdominal or pelvic lymph nodes. Reproductive: Prostate is unremarkable. Other: No free fluid, fluid collection, or free air. Musculoskeletal: No acute or abnormal lytic or blastic osseous lesions. Multilevel degenerative changes of the partially imaged thoracic and lumbar spine. Subcutaneous emphysema and soft tissue stranding in the left anterolateral lower abdominal wall, likely injection related. IMPRESSION: 1. No acute abdominopelvic abnormality. 2. Increased right lower lobe tree-in-bud nodules with partially imaged right lower lobe subsegmental mucous plugging, likely infectious/inflammatory. 3. Increased small right pleural effusion. 4. Findings of constipation with moderate volume stool throughout the colon. 5. Bilateral nonobstructing renal stones measuring up to 4 mm in the left upper lobe. 6. Colonic diverticulosis without acute diverticulitis. 7.  Aortic Atherosclerosis (ICD10-I70.0). Electronically Signed   By: Limin  Xu M.D.   On: 07/22/2024 12:52      Scheduled Meds:  arformoterol   15 mcg Nebulization BID   aspirin  EC  81 mg Oral QHS   bisacodyl   10 mg Oral Daily   bisacodyl   10 mg Rectal Once   budesonide  (PULMICORT ) nebulizer solution  0.25 mg Nebulization BID   enoxaparin  (LOVENOX ) injection  40 mg Subcutaneous Q24H   feeding supplement  237 mL Oral BID BM   finasteride   5 mg Oral QHS   guaiFENesin   600 mg Oral BID   HYDROcodone -acetaminophen   1 tablet Oral TID   insulin  aspart  0-5 Units Subcutaneous QHS   insulin  aspart  0-9 Units Subcutaneous TID WC   insulin  aspart  6 Units Subcutaneous TID WC    insulin  glargine  26 Units Subcutaneous Daily   ipratropium-albuterol   3 mL Nebulization TID   levothyroxine   100 mcg Oral QHS   metoCLOPramide  (REGLAN ) injection  5 mg Intravenous Q8H   pantoprazole   40 mg Oral QHS   polyethylene glycol  17 g Oral BID   senna-docusate  3 tablet Oral QHS   simvastatin   40 mg Oral QHS   sodium chloride  flush  3 mL Intravenous Q12H   tamsulosin   0.4 mg Oral QHS   traZODone   150 mg Oral QHS   Continuous Infusions:  azithromycin  500 mg (07/22/24 2100)   piperacillin -tazobactam (ZOSYN )  IV 3.375 g (07/23/24 0543)   promethazine  (PHENERGAN ) injection (IM or IVPB) 12.5 mg (07/22/24 1145)     LOS: 5 days     Almarie KANDICE Hoots, MD  07/23/2024, 10:13 AM   "
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
## 2024-07-24 ENCOUNTER — Other Ambulatory Visit (HOSPITAL_COMMUNITY): Payer: Self-pay

## 2024-07-24 LAB — GLUCOSE, CAPILLARY
Glucose-Capillary: 154 mg/dL — ABNORMAL HIGH (ref 70–99)
Glucose-Capillary: 148 mg/dL — ABNORMAL HIGH (ref 70–99)

## 2024-07-24 LAB — CULTURE, BLOOD (ROUTINE X 2)
Culture: NO GROWTH
Culture: NO GROWTH
Special Requests: ADEQUATE
Special Requests: ADEQUATE

## 2024-07-24 LAB — CORTISOL-AM, BLOOD: Cortisol - AM: 5.2 ug/dL — ABNORMAL LOW (ref 6.7–22.6)

## 2024-07-24 LAB — CULTURE, RESPIRATORY W GRAM STAIN: Culture: NORMAL

## 2024-07-24 LAB — TSH: TSH: 1.34 u[IU]/mL (ref 0.350–4.500)

## 2024-07-24 MED ORDER — ACETAMINOPHEN 325 MG PO TABS: 650.0000 mg | ORAL_TABLET | Freq: Four times a day (QID) | ORAL | Status: DC | PRN

## 2024-07-24 MED ORDER — ONDANSETRON HCL 4 MG PO TABS
4.0000 mg | ORAL_TABLET | Freq: Four times a day (QID) | ORAL | 0 refills | Status: AC | PRN
  Filled 2024-07-24: qty 20, 5d supply, fill #0

## 2024-07-24 MED ORDER — BISACODYL 10 MG RE SUPP: 10.0000 mg | Freq: Once | RECTAL | Status: DC

## 2024-07-24 MED ORDER — BISACODYL 10 MG RE SUPP
10.0000 mg | Freq: Every day | RECTAL | 0 refills | Status: AC | PRN
  Filled 2024-07-24: qty 12, 12d supply, fill #0

## 2024-07-24 MED ORDER — SENNOSIDES-DOCUSATE SODIUM 8.6-50 MG PO TABS: 3.0000 | ORAL_TABLET | Freq: Every day | ORAL | Status: AC

## 2024-07-24 MED ORDER — POLYETHYLENE GLYCOL 3350 17 GM/SCOOP PO POWD
17.0000 g | Freq: Two times a day (BID) | ORAL | 0 refills | Status: AC
  Filled 2024-07-24: qty 238, 7d supply, fill #0

## 2024-07-24 MED ORDER — FLEET ENEMA RE ENEM
1.0000 | ENEMA | Freq: Once | RECTAL | Status: AC
  Administered 2024-07-24: 1 via RECTAL
  Filled 2024-07-24: qty 1

## 2024-07-24 NOTE — Progress Notes (Signed)
 Discharge meds in a secure bag delivered to patient by this RN.  Wife enroute

## 2024-07-24 NOTE — Progress Notes (Signed)
 Duoneb box in a secure bag for discharge delivered to patient in room by this RN

## 2024-07-24 NOTE — Progress Notes (Signed)
 Patient reported that he had no BM in 6-7 days. States this is his norm and has been for a while. MD aware - order given for Fleets enema. Enema administered with no results. MD then order suppository but patient refused, states he will continue taking miralax  and senna at home and will try suppositories if needed later on. Patient feels comfortable and wishes to go home, MD aware. AVS reviewed. Instructed to follow up with PCP and pulmonology.

## 2024-07-24 NOTE — Plan of Care (Signed)
" °  Problem: Coping: Goal: Ability to adjust to condition or change in health will improve Outcome: Progressing   Problem: Fluid Volume: Goal: Ability to maintain a balanced intake and output will improve Outcome: Progressing   Problem: Metabolic: Goal: Ability to maintain appropriate glucose levels will improve Outcome: Progressing   Problem: Clinical Measurements: Goal: Diagnostic test results will improve Outcome: Progressing Goal: Signs and symptoms of infection will decrease Outcome: Progressing   Problem: Respiratory: Goal: Ability to maintain adequate ventilation will improve Outcome: Progressing   "

## 2024-07-24 NOTE — Care Management Important Message (Signed)
 Important Message  Patient Details IM Letter given. Name: James [name] MRN: [id number] Date of Birth: [birth year]   Important Message Given:  Yes - Medicare IM     Melba Ates 07/24/2024, 12:37 PM

## 2024-07-24 NOTE — Progress Notes (Signed)
 SATURATION QUALIFICATIONS: (This note is used to comply with regulatory documentation for home oxygen )  Patient Saturations on Room Air at Rest = 87%  Patient Saturations on Room Air while Ambulating = 87%  Patient Saturations on 3 Liters of oxygen  while Ambulating = 92%  Please briefly explain why patient needs home oxygen : 3L O2 needed while ambulating to maintain O2 sat of at least 92%

## 2024-07-24 NOTE — TOC Transition Note (Signed)
 Transition of Care [facility]) - Discharge Note   Patient Details  Name: James [name] MRN: [id number] Date of Birth: 1956-06-18  [facility] CM/SW Contact:  Tawni CHRISTELLA Eva, LCSW Phone Number: 07/24/2024, 11:30 AM   Clinical Narrative:     CSW spoke with the pt to discuss the recommendation for outpatient PT. The pt is agreeable, and a referral was sent. The pt reports being on home O2 at baseline through [facility]. The pt reports he will not need a portable tank, as his wife will bring a tank to the hospital upon discharge. No further ICM needs at this time. ICM to sign off.  Final next level of care: Home/Self Care Barriers to Discharge: Barriers Resolved   Patient Goals and CMS Choice Patient states their goals for this hospitalization and ongoing recovery are:: to get home          Discharge Placement                    Patient and family notified of of transfer: 07/24/24  Discharge Plan and Services Additional resources added to the After Visit Summary for                                       Social Drivers of Health (SDOH) Interventions SDOH Screenings   Food Insecurity: No Food Insecurity (07/19/2024)  Housing: Unknown (07/19/2024)  Transportation Needs: No Transportation Needs (07/19/2024)  Utilities: Not At Risk (07/19/2024)  Social Connections: Socially Integrated (07/19/2024)  Tobacco Use: Medium Risk (07/18/2024)     Readmission Risk Interventions    04/12/2024    3:45 PM  Readmission Risk Prevention Plan  Transportation Screening Complete  PCP or Specialist Appt within 5-7 Days Complete  Home Care Screening Complete  Medication Review (RN CM) Complete

## 2024-07-24 NOTE — Progress Notes (Signed)
 Discharge meds in a secure bag delivered to patient by this RN

## 2024-07-27 NOTE — Discharge Summary (Signed)
 Physician Discharge Summary  James [name] FMW:[id number] DOB: 19-Oct-1955 DOA: 07/18/2024  PCP: Nichole Senior, MD  Admit date: 07/18/2024 Discharge date: 07/27/2024  Admitted From: home Disposition: home Recommendations for Outpatient Follow-up:  Follow up with PCP in 1-2 weeks Please obtain BMP/CBC in one week  Home Health:none Equipment/Devices:oxygen  3 L Discharge Condition stable CODE STATUS:full Diet recommendation: cardiac Brief/Interim Summary: 69 y.o. male with medical history significant for COPD/chronic asthmatic bronchitis and bronchiectasis with chronic respiratory failure with hypoxia on 2 L O2 [place], CAD, T1DM, HTN, HLD, hypothyroidism, BPH, depression/anxiety, chronic pain who presented to the ED for evaluation of shortness of breath.   Patient has a history of recurrent pneumonia as well as aspiration since prior cervical fusion.  He was last admitted September 2025 with acute hypoxic respiratory failure secondary to right-sided pneumonia and COPD exacerbation.  Since then he has been requiring 2 L supplemental O2 via [place] at baseline.  He had initial improvement for the first 2 weeks after discharge but since then has had progressively worsening dyspnea on exertion and cough.   He was seen in his pulmonology office earlier this month and diagnosed with right upper lobe pneumonia seen on chest x-ray 06/25/2024.  He completed in 1 week course of Levaquin .  He has not had any significant improvement.  He is still easily short of breath with minimal exertion.  He has frequent cough, mostly productive of clear sputum and occasionally sees follow-up green sputum.  He has been generally fatigued.  He reports frequent nausea without emesis.  Appetite has been low.  He reports approximately 35 pound weight loss since September.  He has occasional chills without fever or diaphoresis.  He has been having intermittent right upper chest pain, worse with deep inspiration.  Blood cultures no growth   SARS-CoV-2, influenza, RSV PCR negative.  Portable chest x-ray showed worsening right upper lobe pneumonia when compared to previous CXR 06/25/2024.  Discharge Diagnoses:  Principal Problem:   Sepsis due to pneumonia [facility]) Active Problems:   HLD (hyperlipidemia)   CAD (coronary artery disease)   Chronic asthmatic bronchitis (HCC)   DM type 1 (diabetes mellitus, type 1) (HCC)   Community acquired pneumonia of right upper lobe of lung   Benign prostatic hyperplasia with lower urinary tract symptoms   Hypothyroidism   COPD (chronic obstructive pulmonary disease) (HCC)   Hypertension associated with diabetes (HCC)   Chronic respiratory failure with hypoxia (HCC)   Nausea   Loss of weight   Early satiety   Decreased appetite   Community acquired pneumonia of right lung  Sepsis due to community acquired pneumonia of the right upper lobe of lung: Patient admitted with failed outpatient antibiotic treatment for pneumonia.  Since September when he was hospitalized he was discharged on oxygen  and remained on oxygen .  However he also remained short of breath with coughing.  He was treated with Levaquin  as an outpatient without improvement.  He was found to be tachycardic tachypneic with leukocytosis with endorgan damage such as right upper lobe pneumonia.  He has been started on Zosyn  and azithromycin  with improvement in his symptoms oxygenation and leukocytosis.   Influenza RSV and COVID, strep pneumo antigen negative.  He was treated with hypertonic saline nebs for 3 days and he was able to bring up more phlegm with improvement in his breathing.  No DuoNebs and chest vest therapy.  Encouraged him to use incentive spirometer.  CT chest 07/19/2024 Right upper lobe patchy consolidative airspace opacities, most consistent  with pneumonia. Upper and lower lobe mucous plugging, including within the distal right main bronchus. Recommend follow-up CT in 3 months to evaluate for complete resolution.Enlarged  mediastinal lymph nodes, including a 1.3 cm precarinal lymph node. No gross hilar adenopathy with limited evaluation on this noncontrast study.Nonobstructive bilateral nephrolithiasis.   Nausea in the setting of chronic gastroparesis/chronic opioid use -patient has been complaining of persistent nausea early satiety and 35 pound weight loss in the last 4 months.  Appreciate Dr. San seeing him.  Patient has a history of chronic gastroparesis for over 20 years but this is gotten worse recently in the setting of narcotic use.   CT of the abdomen did not reveal any acute findings other than a lot of stool burden.  Stool softeners laxatives started.   COPD/chronic asthmatic bronchitis/bronchiectasis: Does not appear to have acute COPD/asthma exacerbation on 3 L of oxygen .    Type 1 diabetes:CBG (last 3)  Recent Labs (last 2 labs)       Recent Labs    07/22/24 1754 07/22/24 2136 07/23/24 0732  GLUCAP 104* 168* 143*    Continue Semglee   Coronary artery disease: Continue aspirin  and simvastatin .   Hypertension: continue home meds  Hyperlipidemia: Continue simvastatin .   Hypothyroidism: Continue Synthroid .   Depression/anxiety: Continue trazodone .   BPH: Continue Flomax  and Proscar .   Chronic pain syndrome: Continue home Norco 5-325 mg 3 times daily prn.  Nutrition Problem: Severe Malnutrition Etiology: chronic illness    Signs/Symptoms: mild fat depletion, severe muscle depletion, percent weight loss (10.7% in 5 months) Percent weight loss: 10.7 % (in 5 months)     Interventions: Refer to RD note for recommendations, Ensure Enlive (each supplement provides 350kcal and 20 grams of protein), MVI, Liberalize Diet  Estimated body mass index is 22.89 kg/m as calculated from the following:   Height as of this encounter: 5' 9 (1.753 m).   Weight as of this encounter: 70.3 kg.  Discharge Instructions  Discharge Instructions     Ambulatory referral to Physical  Therapy   Complete by: As directed    Increase activity slowly   Complete by: As directed    Increase activity slowly   Complete by: As directed    Increase activity slowly   Complete by: As directed       Allergies as of 07/24/2024       Reactions   Augmentin  [amoxicillin -pot Clavulanate] Nausea And Vomiting   Ivp Dye [iodinated Contrast Media] Other (See Comments)   Dizziness/sweating ALSO   Lexapro [escitalopram Oxalate] Itching        Medication List     STOP taking these medications    ibuprofen  200 MG tablet Commonly known as: ADVIL    levofloxacin  750 MG tablet Commonly known as: LEVAQUIN    predniSONE  10 MG tablet Commonly known as: DELTASONE    promethazine -dextromethorphan  6.25-15 MG/5ML syrup Commonly known as: PROMETHAZINE -DM       TAKE these medications    acetaminophen  500 MG tablet Commonly known as: TYLENOL  Take 1,000 mg by mouth every 6 (six) hours as needed for mild pain or headache.   albuterol  (2.5 MG/3ML) 0.083% nebulizer solution Commonly known as: PROVENTIL  Take 3 mLs by nebulization every 6 (six) hours as needed. What changed: Another medication with the same name was removed. Continue taking this medication, and follow the directions you see here.   albuterol  108 (90 Base) MCG/ACT inhaler Commonly known as: VENTOLIN  HFA Inhale 2 puffs into the lungs every 6 (six) hours as needed for  wheezing or shortness of breath. What changed: Another medication with the same name was removed. Continue taking this medication, and follow the directions you see here.   aspirin  EC 81 MG tablet Take 1 tablet (81 mg total) by mouth daily. Swallow whole. What changed: when to take this   BD Insulin  Syringe Ultrafine 31G X 5/16 0.3 ML Misc Generic drug: Insulin  Syringe-Needle U-100 use to inject insulin  6 to 8 times daily   cetirizine 10 MG tablet Commonly known as: ZYRTEC Take 10 mg by mouth at bedtime.   Contour Next Test test strip Generic  drug: glucose blood USE 8 TIMES DAILY AS DIRECTED TO MONITOR BLOOD GLUCOSE   finasteride  5 MG tablet Commonly known as: PROSCAR  Take 1 tablet (5 mg total) by mouth daily. What changed: when to take this   fluticasone -salmeterol 250-50 MCG/ACT Aepb Commonly known as: ADVAIR  Inhale 1 puff into the lungs every 12 (twelve) hours.   FreeStyle Libre 3 Plus Sensor Misc Inject 1 Device into the skin See admin instructions. Place a new sensor into the skin every 15 days   FreeStyle Libre 3 Sensor Misc Change sensor every 14 days to monitor blood glucose continously   Gentle Laxative 10 MG suppository Generic drug: bisacodyl  Place 1 suppository (10 mg total) rectally daily as needed for moderate constipation.   guaiFENesin  600 MG 12 hr tablet Commonly known as: MUCINEX  Take 2 tablets (1,200 mg total) by mouth 2 (two) times daily.   Gvoke HypoPen  2-Pack 1 MG/0.2ML Soaj Generic drug: Glucagon  Inject under the skin as directed for severe hypoglycemia.   Gvoke HypoPen  2-Pack 1 MG/0.2ML Soaj Generic drug: Glucagon  Use as directed under the skin for severe hypoglycemia.   HYDROcodone -acetaminophen  5-325 MG tablet Commonly known as: NORCO/VICODIN Take 1 tablet by mouth 3 (three) times daily as needed   Insulin  Aspart FlexPen 100 UNIT/ML Commonly known as: NOVOLOG  Inject up to 100 units subcutaneously per [place] as directed (ICR 20, ISF 20, target 120)   ipratropium-albuterol  0.5-2.5 (3) MG/3ML Soln Commonly known as: DUONEB Inhale 3 mLs into the lungs every 6 (six) hours as needed with nebulizer. What changed: Another medication with the same name was removed. Continue taking this medication, and follow the directions you see here.   Lantus  SoloStar 100 UNIT/ML Solostar Pen Generic drug: insulin  glargine Inject 25-30 Units into the skin daily as directed What changed:  how much to take when to take this   levothyroxine  100 MCG tablet Commonly known as: SYNTHROID  Take 1 tablet (100  mcg total) by mouth daily. What changed: when to take this   metoCLOPramide  5 MG tablet Commonly known as: REGLAN  Take 1 tablet (5 mg total) by mouth 2 (two) times daily before a meal.   ondansetron  4 MG tablet Commonly known as: ZOFRAN  Take 1 tablet (4 mg total) by mouth every 6 (six) hours as needed for nausea.   pantoprazole  40 MG tablet Commonly known as: PROTONIX  Take 1 tablet by mouth daily as directed. What changed: when to take this   polyethylene glycol powder 17 GM/SCOOP powder Commonly known as: GLYCOLAX /MIRALAX  Take 17 g by mouth 2 (two) times daily. Dissolve 1 capful (17g) in 4-8 ounces of liquid and take by mouth two times daily.   promethazine  25 MG tablet Commonly known as: PHENERGAN  Take 1 tablet (25 mg total) by mouth 3 (three) times daily as needed.   senna-docusate 8.6-50 MG tablet Commonly known as: Senokot-S Take 3 tablets by mouth at bedtime.   simvastatin  40  MG tablet Commonly known as: ZOCOR  Take 1 tablet (40 mg total) by mouth daily. What changed: when to take this   sodium chloride  0.65 % Soln nasal spray Commonly known as: OCEAN Place 1 spray into both nostrils as needed for congestion.   tamsulosin  0.4 MG Caps capsule Commonly known as: FLOMAX  Take 1 capsule (0.4 mg total) by mouth daily. What changed: when to take this   TechLite Pen Needles 31G X 8 MM Misc Generic drug: Insulin  Pen Needle Use 1 pen needle subcutaneously 4 - 5 times daily as directed.   B-D ULTRAFINE III SHORT PEN 31G X 8 MM Misc Generic drug: Insulin  Pen Needle Use 1 pen needle 4-5 times daily as directed.   traZODone  150 MG tablet Commonly known as: DESYREL  Take 1 tablet (150 mg total) by mouth at bedtime as needed for sleep What changed:  when to take this additional instructions        Allergies[1]  Consultations:GI  Procedures/Studies: CT ABDOMEN PELVIS WO CONTRAST Result Date: 07/22/2024 CLINICAL DATA:  Weight loss and nausea EXAM: CT ABDOMEN AND  PELVIS WITHOUT CONTRAST TECHNIQUE: Multidetector CT imaging of the abdomen and pelvis was performed following the standard protocol without IV contrast. RADIATION DOSE REDUCTION: This exam was performed according to the departmental dose-optimization program which includes automated exposure control, adjustment of the mA and/or kV according to patient size and/or use of iterative reconstruction technique. COMPARISON:  CT chest dated 07/19/2024, CT abdomen and pelvis dated 12/14/2012 FINDINGS: Lower chest: Increased right lower lobe tree-in-bud nodules. Partially imaged right lower lobe subsegmental mucous plugging. Left lower lobe linear atelectasis/scarring. Increased small right pleural effusion. Partially imaged heart size is normal. Hepatobiliary: No focal hepatic lesions. No intra or extrahepatic biliary ductal dilation. Normal gallbladder. Pancreas: No focal lesions or main ductal dilation. Spleen: Normal in size.  Scattered calcified splenic granulomata. Adrenals/Urinary Tract: No adrenal nodules. Duplex left kidney. No suspicious renal masses by noncontrast technique. No hydronephrosis. Bilateral nonobstructing stones measuring up to 4 mm in the left upper lobe. No focal bladder wall thickening. Stomach/Bowel: Normal appearance of the stomach. No evidence of bowel wall thickening, distention, or inflammatory changes. Moderate volume stool throughout the colon. Colonic diverticulosis without acute diverticulitis. Appendix is not discretely seen. Vascular/Lymphatic: Aortic atherosclerosis. No enlarged abdominal or pelvic lymph nodes. Reproductive: Prostate is unremarkable. Other: No free fluid, fluid collection, or free air. Musculoskeletal: No acute or abnormal lytic or blastic osseous lesions. Multilevel degenerative changes of the partially imaged thoracic and lumbar spine. Subcutaneous emphysema and soft tissue stranding in the left anterolateral lower abdominal wall, likely injection related. IMPRESSION:  1. No acute abdominopelvic abnormality. 2. Increased right lower lobe tree-in-bud nodules with partially imaged right lower lobe subsegmental mucous plugging, likely infectious/inflammatory. 3. Increased small right pleural effusion. 4. Findings of constipation with moderate volume stool throughout the colon. 5. Bilateral nonobstructing renal stones measuring up to 4 mm in the left upper lobe. 6. Colonic diverticulosis without acute diverticulitis. 7.  Aortic Atherosclerosis (ICD10-I70.0). Electronically Signed   By: Limin  Xu M.D.   On: 07/22/2024 12:52   CT CHEST WO CONTRAST Result Date: 07/19/2024 EXAM: CT CHEST WITHOUT CONTRAST 07/19/2024 09:55:19 AM TECHNIQUE: CT of the chest was performed without the administration of intravenous contrast. Multiplanar reformatted images are provided for review. Automated exposure control, iterative reconstruction, and/or weight based adjustment of the mA/kV was utilized to reduce the radiation dose to as low as reasonably achievable. COMPARISON: None available. CLINICAL HISTORY: Pneumonia, complication suspected, xray done;  recurrent RUL pneumonia. FINDINGS: MEDIASTINUM: Heart: At least 3-vessel coronary artery calcifications. Aortic valve leaflet calcification. Pericardium is unremarkable. The central airways demonstrate mucous plugging noted within the distal bronchus. Mild-to-moderate atherosclerotic plaque. LYMPH NODES: Enlarged mediastinal lymph nodes, including a 1.3 cm precarinal lymph node. No gross hilar adenopathy with markedly limited evaluation on this noncontrast study and associated adjacent lung disease. No axillary lymphadenopathy. LUNGS AND PLEURA: Right upper lobe patchy consolidative airspace opacities. Upper and lower lobe mucous plugging. No pulmonary edema. No pleural effusion or pneumothorax. SOFT TISSUES/BONES: Old healed bilateral posterior rib fractures. No acute abnormality of the bones or soft tissues. UPPER ABDOMEN: Limited images of the upper  abdomen demonstrate a tiny hiatal hernia. Bilateral nephrolithiasis with a 7 mm left stone and punctate right stones noted. IMPRESSION: 1. Right upper lobe patchy consolidative airspace opacities, most consistent with pneumonia. Upper and lower lobe mucous plugging, including within the distal right main bronchus. Recommend follow-up CT in 3 months to evaluate for complete resolution. 2. Enlarged mediastinal lymph nodes, including a 1.3 cm precarinal lymph node. No gross hilar adenopathy with limited evaluation on this noncontrast study. 3. Nonobstructive bilateral nephrolithiasis. Electronically signed by: Morgane Naveau MD 07/19/2024 11:15 AM EST RP Workstation: [id number]   DG Chest Port 1 View Result Date: 07/18/2024 EXAM: 1 VIEW(S) XRAY OF THE CHEST 07/18/2024 08:03:00 PM COMPARISON: 06/25/2024 CLINICAL HISTORY: Cough, Dyspnea, recent pneumonia. FINDINGS: LUNGS AND PLEURA: Progression of right upper lobe airspace opacity consistent with worsening pneumonia. Follow up to resolution recommended. No pleural effusion. No pneumothorax. HEART AND MEDIASTINUM: No acute abnormality of the cardiac and mediastinal silhouettes. BONES AND SOFT TISSUES: No acute osseous abnormality. IMPRESSION: 1. Worsening right upper lobe pneumonia. Follow-up to resolution recommended. Electronically signed by: Vanetta Chou MD 07/18/2024 08:41 PM EST RP Workstation: [id number]   (Echo, Carotid, EGD, Colonoscopy, ERCP)    Subjective:  AWAKE ALERT no new c/o Discharge Exam: Vitals:   07/24/24 1317 07/24/24 1426  BP: (!) 146/77   Pulse: 78   Resp: 16   Temp: 97.8 F (36.6 C)   SpO2: 99% 95%   Vitals:   07/24/24 0640 07/24/24 0900 07/24/24 1317 07/24/24 1426  BP: (!) 155/74  (!) 146/77   Pulse: 71  78   Resp:   16   Temp:   97.8 F (36.6 C)   TempSrc:   Oral   SpO2: 98% 96% 99% 95%  Weight:      Height:        General: Pt is alert, awake, not in acute distress Cardiovascular: RRR, S1/S2 +, no rubs, no  gallops Respiratory: CTA bilaterally, no wheezing, no rhonchi Abdominal: Soft, NT, ND, bowel sounds + Extremities: no edema, no cyanosis    The results of significant diagnostics from this hospitalization (including imaging, microbiology, ancillary and laboratory) are listed below for reference.     Microbiology: Recent Results (from the past 240 hours)  Culture, blood (routine x 2)     Status: None   Collection Time: 07/18/24  8:30 PM   Specimen: BLOOD LEFT ARM  Result Value Ref Range Status   Specimen Description   Final    BLOOD LEFT ARM Performed at [facility], [address], [place], [place] [address]    Special Requests   Final    BOTTLES DRAWN AEROBIC AND ANAEROBIC Blood Culture adequate volume Performed at [facility], [address], [place], [place] [address]    Culture   Final    NO  GROWTH 5 DAYS Performed at [facility], [address] [place], [place] [address]    Report Status 07/24/2024 FINAL  Final  Resp panel by RT-PCR (RSV, Flu A&B, Covid) Anterior Nasal Swab     Status: None   Collection Time: 07/18/24  8:37 PM   Specimen: Anterior Nasal Swab  Result Value Ref Range Status   SARS Coronavirus 2 by RT PCR NEGATIVE NEGATIVE Final    Comment: (NOTE) SARS-CoV-2 target nucleic acids are NOT DETECTED.  The SARS-CoV-2 RNA is generally detectable in upper respiratory specimens during the acute phase of infection. The lowest concentration of SARS-CoV-2 viral copies this assay can detect is 138 copies/mL. A negative result does not preclude SARS-Cov-2 infection and should not be used as the sole basis for treatment or other patient management decisions. A negative result may occur with  improper specimen collection/handling, submission of specimen other than nasopharyngeal swab, presence of viral mutation(s) within the areas targeted by this assay, and inadequate number of viral copies(<138 copies/mL). A negative result must  be combined with clinical observations, patient history, and epidemiological information. The expected result is Negative.  Fact Sheet for Patients:  bloggercourse.com  Fact Sheet for Healthcare Providers:  seriousbroker.it  This test is no t yet approved or cleared by the [place]  FDA and  has been authorized for detection and/or diagnosis of SARS-CoV-2 by FDA under an Emergency Use Authorization (EUA). This EUA will remain  in effect (meaning this test can be used) for the duration of the COVID-19 declaration under Section 564(b)(1) of the Act, 21 U.S.C.section 360bbb-3(b)(1), unless the authorization is terminated  or revoked sooner.       Influenza A by PCR NEGATIVE NEGATIVE Final   Influenza B by PCR NEGATIVE NEGATIVE Final    Comment: (NOTE) The Xpert Xpress SARS-CoV-2/FLU/RSV plus assay is intended as an aid in the diagnosis of influenza from Nasopharyngeal swab specimens and should not be used as a sole basis for treatment. Nasal washings and aspirates are unacceptable for Xpert Xpress SARS-CoV-2/FLU/RSV testing.  Fact Sheet for Patients: bloggercourse.com  Fact Sheet for Healthcare Providers: seriousbroker.it  This test is not yet approved or cleared by the [place]  FDA and has been authorized for detection and/or diagnosis of SARS-CoV-2 by FDA under an Emergency Use Authorization (EUA). This EUA will remain in effect (meaning this test can be used) for the duration of the COVID-19 declaration under Section 564(b)(1) of the Act, 21 U.S.C. section 360bbb-3(b)(1), unless the authorization is terminated or revoked.     Resp Syncytial Virus by PCR NEGATIVE NEGATIVE Final    Comment: (NOTE) Fact Sheet for Patients: bloggercourse.com  Fact Sheet for Healthcare Providers: seriousbroker.it  This test is not  yet approved or cleared by the [place]  FDA and has been authorized for detection and/or diagnosis of SARS-CoV-2 by FDA under an Emergency Use Authorization (EUA). This EUA will remain in effect (meaning this test can be used) for the duration of the COVID-19 declaration under Section 564(b)(1) of the Act, 21 U.S.C. section 360bbb-3(b)(1), unless the authorization is terminated or revoked.  Performed at [facility], [address], [place], [place] [address]   Expectorated Sputum Assessment w Gram Stain, Rflx to Resp Cult     Status: None   Collection Time: 07/19/24  2:00 PM   Specimen: Expectorated Sputum  Result Value Ref Range Status   Specimen Description EXPECTORATED SPUTUM  Final   Special Requests NONE  Final   Sputum  evaluation   Final    THIS SPECIMEN IS ACCEPTABLE FOR SPUTUM CULTURE Performed at [facility], [address], [place], [place] [address]    Report Status 07/19/2024 FINAL  Final  Culture, Respiratory w Gram Stain     Status: None   Collection Time: 07/19/24  2:00 PM  Result Value Ref Range Status   Specimen Description   Final    EXPECTORATED SPUTUM Performed at [facility], [address], [place], [place] [address]    Special Requests   Final    NONE Reflexed from [id number] Performed at [facility], [address], [place], [place] [address]    Gram Stain   Final    ABUNDANT WBC PRESENT, PREDOMINANTLY PMN MODERATE GRAM POSITIVE COCCI RARE GRAM POSITIVE RODS Performed at [facility], [address] [place], [place] [address]    Culture RARE PSEUDOMONAS AERUGINOSA  Final   Report Status 07/24/2024 FINAL  Final   Organism ID, Bacteria PSEUDOMONAS AERUGINOSA  Final      Susceptibility   Pseudomonas aeruginosa - MIC*    MEROPENEM 1 SENSITIVE Sensitive     CIPROFLOXACIN  <=0.06 SENSITIVE Sensitive     IMIPENEM 1 SENSITIVE Sensitive     PIP/TAZO Value in next row  Sensitive      8 SENSITIVEThis is a modified FDA-approved test that has been validated and its performance characteristics determined by the reporting laboratory.  This laboratory is certified under the Clinical Laboratory Improvement Amendments CLIA as qualified to perform high complexity clinical laboratory testing.    CEFEPIME Value in next row Sensitive      8 SENSITIVEThis is a modified FDA-approved test that has been validated and its performance characteristics determined by the reporting laboratory.  This laboratory is certified under the Clinical Laboratory Improvement Amendments CLIA as qualified to perform high complexity clinical laboratory testing.    CEFTAZIDIME/AVIBACTAM Value in next row Sensitive      8 SENSITIVEThis is a modified FDA-approved test that has been validated and its performance characteristics determined by the reporting laboratory.  This laboratory is certified under the Clinical Laboratory Improvement Amendments CLIA as qualified to perform high complexity clinical laboratory testing.    CEFTOLOZANE/TAZOBACTAM Value in next row Sensitive      8 SENSITIVEThis is a modified FDA-approved test that has been validated and its performance characteristics determined by the reporting laboratory.  This laboratory is certified under the Clinical Laboratory Improvement Amendments CLIA as qualified to perform high complexity clinical laboratory testing.    TOBRAMYCIN Value in next row Sensitive      8 SENSITIVEThis is a modified FDA-approved test that has been validated and its performance characteristics determined by the reporting laboratory.  This laboratory is certified under the Clinical Laboratory Improvement Amendments CLIA as qualified to perform high complexity clinical laboratory testing.    CEFTAZIDIME Value in next row Sensitive      8 SENSITIVEThis is a modified FDA-approved test that has been validated and its performance characteristics determined by the reporting  laboratory.  This laboratory is certified under the Clinical Laboratory Improvement Amendments CLIA as qualified to perform high complexity clinical laboratory testing.    * RARE PSEUDOMONAS AERUGINOSA  MRSA Next Gen by PCR, Nasal     Status: None   Collection Time: 07/19/24  3:00 PM   Specimen: Nasal Mucosa; Nasal Swab  Result Value Ref Range Status   MRSA by PCR Next Gen NOT DETECTED NOT DETECTED Final  Comment: (NOTE) The GeneXpert MRSA Assay (FDA approved for NASAL specimens only), is one component of a comprehensive MRSA colonization surveillance program. It is not intended to diagnose MRSA infection nor to guide or monitor treatment for MRSA infections. Test performance is not FDA approved in patients less than 15 years old. Performed at [facility], [address], [place], [place] [address]   Culture, blood (routine x 2)     Status: None   Collection Time: 07/19/24  3:22 PM   Specimen: BLOOD RIGHT ARM  Result Value Ref Range Status   Specimen Description   Final    BLOOD RIGHT ARM Performed at [facility], [address], [place], [place] [address]    Special Requests   Final    BOTTLES DRAWN AEROBIC ONLY Blood Culture adequate volume Performed at [facility], [address], [place], [place] [address]    Culture   Final    NO GROWTH 5 DAYS Performed at [facility], [address] [place], [place] [address]    Report Status 07/24/2024 FINAL  Final     Labs: BNP (last 3 results) No results for input(s): BNP in the last 8760 hours. Basic Metabolic Panel: Recent Labs  Lab 07/22/24 0435 07/23/24 1108  NA 137 136  K 4.8 4.0  CL 98 98  CO2 26 28  GLUCOSE 236* 224*  BUN 8 9  CREATININE 0.70 0.62  CALCIUM 9.4 8.9   Liver Function Tests: No results for input(s): AST, ALT, ALKPHOS, BILITOT, PROT, ALBUMIN  in the last 168 hours. No results for input(s): LIPASE, AMYLASE in the last  168 hours. No results for input(s): AMMONIA in the last 168 hours. CBC: Recent Labs  Lab 07/22/24 0435 07/23/24 1108  WBC 12.2* 9.0  HGB 10.4* 10.1*  HCT 31.3* 29.3*  MCV 89.7 88.5  PLT 365 357   Cardiac Enzymes: No results for input(s): CKTOTAL, CKMB, CKMBINDEX, TROPONINI in the last 168 hours. BNP: Invalid input(s): POCBNP CBG: Recent Labs  Lab 07/23/24 1147 07/23/24 1634 07/23/24 2059 07/24/24 0741 07/24/24 1157  GLUCAP 189* 118* 120* 154* 148*   D-Dimer No results for input(s): DDIMER in the last 72 hours. Hgb A1c No results for input(s): HGBA1C in the last 72 hours. Lipid Profile No results for input(s): CHOL, HDL, LDLCALC, TRIG, CHOLHDL, LDLDIRECT in the last 72 hours. Thyroid  function studies No results for input(s): TSH, T4TOTAL, T3FREE, THYROIDAB in the last 72 hours.  Invalid input(s): FREET3 Anemia work up No results for input(s): VITAMINB12, FOLATE, FERRITIN, TIBC, IRON, RETICCTPCT in the last 72 hours. Urinalysis    Component Value Date/Time   COLORURINE YELLOW 07/18/2024 2324   APPEARANCEUR CLEAR 07/18/2024 2324   LABSPEC 1.020 07/18/2024 2324   PHURINE 5.0 07/18/2024 2324   GLUCOSEU 50 (A) 07/18/2024 2324   GLUCOSEU 500 (?) 05/08/2010 1434   HGBUR SMALL (A) 07/18/2024 2324   BILIRUBINUR NEGATIVE 07/18/2024 2324   KETONESUR 20 (A) 07/18/2024 2324   PROTEINUR 30 (A) 07/18/2024 2324   UROBILINOGEN 0.2 04/11/2013 0008   NITRITE NEGATIVE 07/18/2024 2324   LEUKOCYTESUR NEGATIVE 07/18/2024 2324   Sepsis Labs Recent Labs  Lab 07/22/24 0435 07/23/24 1108  WBC 12.2* 9.0   Microbiology Recent Results (from the past 240 hours)  Culture, blood (routine x 2)     Status: None   Collection Time: 07/18/24  8:30 PM   Specimen: BLOOD LEFT ARM  Result Value Ref Range Status   Specimen Description  Final    BLOOD LEFT ARM Performed at [facility], [address], [place], [place] [address]     Special Requests   Final    BOTTLES DRAWN AEROBIC AND ANAEROBIC Blood Culture adequate volume Performed at [facility], [address] [place], [place] [address]    Culture   Final    NO GROWTH 5 DAYS Performed at [facility], [address] [place], [place] [address]    Report Status 07/24/2024 FINAL  Final  Resp panel by RT-PCR (RSV, Flu A&B, Covid) Anterior Nasal Swab     Status: None   Collection Time: 07/18/24  8:37 PM   Specimen: Anterior Nasal Swab  Result Value Ref Range Status   SARS Coronavirus 2 by RT PCR NEGATIVE NEGATIVE Final    Comment: (NOTE) SARS-CoV-2 target nucleic acids are NOT DETECTED.  The SARS-CoV-2 RNA is generally detectable in upper respiratory specimens during the acute phase of infection. The lowest concentration of SARS-CoV-2 viral copies this assay can detect is 138 copies/mL. A negative result does not preclude SARS-Cov-2 infection and should not be used as the sole basis for treatment or other patient management decisions. A negative result may occur with  improper specimen collection/handling, submission of specimen other than nasopharyngeal swab, presence of viral mutation(s) within the areas targeted by this assay, and inadequate number of viral copies(<138 copies/mL). A negative result must be combined with clinical observations, patient history, and epidemiological information. The expected result is Negative.  Fact Sheet for Patients:  bloggercourse.com  Fact Sheet for Healthcare Providers:  seriousbroker.it  This test is no t yet approved or cleared by the [place]  FDA and  has been authorized for detection and/or diagnosis of SARS-CoV-2 by FDA under an Emergency Use Authorization (EUA). This EUA will remain  in effect (meaning this test can be used) for the duration of the COVID-19 declaration under Section 564(b)(1) of the Act, 21 U.S.C.section  360bbb-3(b)(1), unless the authorization is terminated  or revoked sooner.       Influenza A by PCR NEGATIVE NEGATIVE Final   Influenza B by PCR NEGATIVE NEGATIVE Final    Comment: (NOTE) The Xpert Xpress SARS-CoV-2/FLU/RSV plus assay is intended as an aid in the diagnosis of influenza from Nasopharyngeal swab specimens and should not be used as a sole basis for treatment. Nasal washings and aspirates are unacceptable for Xpert Xpress SARS-CoV-2/FLU/RSV testing.  Fact Sheet for Patients: bloggercourse.com  Fact Sheet for Healthcare Providers: seriousbroker.it  This test is not yet approved or cleared by the [place]  FDA and has been authorized for detection and/or diagnosis of SARS-CoV-2 by FDA under an Emergency Use Authorization (EUA). This EUA will remain in effect (meaning this test can be used) for the duration of the COVID-19 declaration under Section 564(b)(1) of the Act, 21 U.S.C. section 360bbb-3(b)(1), unless the authorization is terminated or revoked.     Resp Syncytial Virus by PCR NEGATIVE NEGATIVE Final    Comment: (NOTE) Fact Sheet for Patients: bloggercourse.com  Fact Sheet for Healthcare Providers: seriousbroker.it  This test is not yet approved or cleared by the [place]  FDA and has been authorized for detection and/or diagnosis of SARS-CoV-2 by FDA under an Emergency Use Authorization (EUA). This EUA will remain in effect (meaning this test can be used) for the duration of the COVID-19 declaration under Section 564(b)(1) of the Act, 21 U.S.C. section 360bbb-3(b)(1), unless the authorization is terminated or revoked.  Performed at [facility], [address], [place], [place] [address]   Expectorated Sputum Assessment w Gram Stain, Rflx to Resp Cult     Status: None   Collection Time: 07/19/24  2:00 PM   Specimen:  Expectorated Sputum  Result Value Ref Range Status   Specimen Description EXPECTORATED SPUTUM  Final   Special Requests NONE  Final   Sputum evaluation   Final    THIS SPECIMEN IS ACCEPTABLE FOR SPUTUM CULTURE Performed at [facility], [address], [place], [place] [address]    Report Status 07/19/2024 FINAL  Final  Culture, Respiratory w Gram Stain     Status: None   Collection Time: 07/19/24  2:00 PM  Result Value Ref Range Status   Specimen Description   Final    EXPECTORATED SPUTUM Performed at [facility], [address], [place], [place] [address]    Special Requests   Final    NONE Reflexed from [id number] Performed at [facility], [address] [place], [place] [address]    Gram Stain   Final    ABUNDANT WBC PRESENT, PREDOMINANTLY PMN MODERATE GRAM POSITIVE COCCI RARE GRAM POSITIVE RODS Performed at [facility], [address], [place], [place] [address]    Culture RARE PSEUDOMONAS AERUGINOSA  Final   Report Status 07/24/2024 FINAL  Final   Organism ID, Bacteria PSEUDOMONAS AERUGINOSA  Final      Susceptibility   Pseudomonas aeruginosa - MIC*    MEROPENEM 1 SENSITIVE Sensitive     CIPROFLOXACIN  <=0.06 SENSITIVE Sensitive     IMIPENEM 1 SENSITIVE Sensitive     PIP/TAZO Value in next row Sensitive      8 SENSITIVEThis is a modified FDA-approved test that has been validated and its performance characteristics determined by the reporting laboratory.  This laboratory is certified under the Clinical Laboratory Improvement Amendments CLIA as qualified to perform high complexity clinical laboratory testing.    CEFEPIME Value in next row Sensitive      8 SENSITIVEThis is a modified FDA-approved test that has been validated and its performance characteristics determined by the reporting laboratory.  This laboratory is certified under the Clinical Laboratory Improvement Amendments CLIA as qualified to perform high  complexity clinical laboratory testing.    CEFTAZIDIME/AVIBACTAM Value in next row Sensitive      8 SENSITIVEThis is a modified FDA-approved test that has been validated and its performance characteristics determined by the reporting laboratory.  This laboratory is certified under the Clinical Laboratory Improvement Amendments CLIA as qualified to perform high complexity clinical laboratory testing.    CEFTOLOZANE/TAZOBACTAM Value in next row Sensitive      8 SENSITIVEThis is a modified FDA-approved test that has been validated and its performance characteristics determined by the reporting laboratory.  This laboratory is certified under the Clinical Laboratory Improvement Amendments CLIA as qualified to perform high complexity clinical laboratory testing.    TOBRAMYCIN Value in next row Sensitive      8 SENSITIVEThis is a modified FDA-approved test that has been validated and its performance characteristics determined by the reporting laboratory.  This laboratory is certified under the Clinical Laboratory Improvement Amendments CLIA as qualified to perform high complexity clinical laboratory testing.    CEFTAZIDIME Value in next row Sensitive      8 SENSITIVEThis is a modified FDA-approved test that has been validated and its performance characteristics determined by the reporting laboratory.  This laboratory is certified under the Clinical Laboratory Improvement Amendments  CLIA as qualified to perform high complexity clinical laboratory testing.    * RARE PSEUDOMONAS AERUGINOSA  MRSA Next Gen by PCR, Nasal     Status: None   Collection Time: 07/19/24  3:00 PM   Specimen: Nasal Mucosa; Nasal Swab  Result Value Ref Range Status   MRSA by PCR Next Gen NOT DETECTED NOT DETECTED Final    Comment: (NOTE) The GeneXpert MRSA Assay (FDA approved for NASAL specimens only), is one component of a comprehensive MRSA colonization surveillance program. It is not intended to diagnose MRSA infection nor to  guide or monitor treatment for MRSA infections. Test performance is not FDA approved in patients less than 34 years old. Performed at [facility], [address], [place], [place] [address]   Culture, blood (routine x 2)     Status: None   Collection Time: 07/19/24  3:22 PM   Specimen: BLOOD RIGHT ARM  Result Value Ref Range Status   Specimen Description   Final    BLOOD RIGHT ARM Performed at [facility], [address] [place], [place] [address]    Special Requests   Final    BOTTLES DRAWN AEROBIC ONLY Blood Culture adequate volume Performed at [facility], [address], [place], [place] [address]    Culture   Final    NO GROWTH 5 DAYS Performed at [facility], [address] [place], [place] [address]    Report Status 07/24/2024 FINAL  Final     Time coordinating discharge: 77 MIN  SIGNED:   Almarie KANDICE Hoots, MD  [facility] 07/27/2024, 1:15 PM     [1]  Allergies Allergen Reactions   Augmentin  [Amoxicillin -Pot Clavulanate] Nausea And Vomiting   Ivp Dye [Iodinated Contrast Media] Other (See Comments)    Dizziness/sweating ALSO   Lexapro [Escitalopram Oxalate] Itching

## 2024-08-01 ENCOUNTER — Other Ambulatory Visit (HOSPITAL_COMMUNITY): Payer: Self-pay

## 2024-08-02 ENCOUNTER — Other Ambulatory Visit (HOSPITAL_COMMUNITY): Payer: Self-pay

## 2024-08-02 MED ORDER — HYDROCODONE-ACETAMINOPHEN 5-325 MG PO TABS
1.0000 | ORAL_TABLET | Freq: Three times a day (TID) | ORAL | 0 refills | Status: AC | PRN
  Filled 2024-08-02: qty 60, 20d supply, fill #0

## 2024-08-07 ENCOUNTER — Encounter (HOSPITAL_BASED_OUTPATIENT_CLINIC_OR_DEPARTMENT_OTHER): Payer: Self-pay

## 2024-08-07 ENCOUNTER — Ambulatory Visit (HOSPITAL_BASED_OUTPATIENT_CLINIC_OR_DEPARTMENT_OTHER)

## 2024-08-07 ENCOUNTER — Other Ambulatory Visit (HOSPITAL_COMMUNITY): Payer: Self-pay

## 2024-08-07 VITALS — BP 98/64 | HR 88 | Ht 69.0 in | Wt 148.0 lb

## 2024-08-07 DIAGNOSIS — R630 Anorexia: Secondary | ICD-10-CM | POA: Diagnosis not present

## 2024-08-07 DIAGNOSIS — R634 Abnormal weight loss: Secondary | ICD-10-CM

## 2024-08-07 DIAGNOSIS — J189 Pneumonia, unspecified organism: Secondary | ICD-10-CM

## 2024-08-07 DIAGNOSIS — J45998 Other asthma: Secondary | ICD-10-CM

## 2024-08-07 DIAGNOSIS — Z87891 Personal history of nicotine dependence: Secondary | ICD-10-CM | POA: Diagnosis not present

## 2024-08-07 DIAGNOSIS — J9611 Chronic respiratory failure with hypoxia: Secondary | ICD-10-CM | POA: Diagnosis not present

## 2024-08-07 DIAGNOSIS — J151 Pneumonia due to Pseudomonas: Secondary | ICD-10-CM

## 2024-08-07 MED ORDER — ALBUTEROL SULFATE (2.5 MG/3ML) 0.083% IN NEBU
3.0000 mL | INHALATION_SOLUTION | Freq: Four times a day (QID) | RESPIRATORY_TRACT | 3 refills | Status: DC | PRN
  Filled 2024-08-07: qty 180, 15d supply, fill #0

## 2024-08-07 MED ORDER — SODIUM CHLORIDE 3 % IN NEBU
INHALATION_SOLUTION | RESPIRATORY_TRACT | 12 refills | Status: DC | PRN
  Filled 2024-08-07: qty 750, 25d supply, fill #0

## 2024-08-07 NOTE — Patient Instructions (Signed)
 Resume Advair ; use twice daily.  Resume Mucinex  twice daily.  Activity encouraged as tolerated.  Twice daily: Use Albuterol  nebulizer followed by 3% saline neb followed by Flutter valve. Cough and deep breath after using nebs and flutter valve.  Do this for two weeks then begin to wean down to Albuterol  as needed.  Okay to stop saline nebs at that point.   Follow up Chest CT in April 2026.   Return to clinic after this is completed.

## 2024-08-07 NOTE — Progress Notes (Signed)
 "  @Patient  ID: James [name], male    DOB: 04/30/1956, 69 y.o.   MRN: [id number]  Chief Complaint  Patient presents with   Follow-up    Referring provider: Nichole Senior, MD  HPI: Discussed the use of AI scribe software for clinical note transcription with the patient, who gave verbal consent to proceed.  History of Present Illness James [name] is a 69 year old male with recurrent pneumonia who presents for hospital follow up after an admission for sepsis 2nd to pneumonia.  Sputum cultures in the hospital revealed Pseudomonas aeruginosa sensitive to cipro  and Zosyn .   He has a history of recurrent pneumonia, with the most recent episode resulting in a [facility] hospitalization. Initial treatment with Levaquin  provided minimal improvement, and a chest x-ray during hospitalization showed worsening pneumonia. He received multiple antibiotics, including Zosyn , and a sputum sample identified Pseudomonas as the causative organism.  Since discharge, he experiences persistent weakness, a weak voice, and fluctuating oxygen  levels. He sometimes uses supplemental oxygen , which he finds beneficial, despite oxygen  saturation levels being between 92-94% on room air. Significant fatigue is present, requiring rest after minimal activity such as showering.  He has not been using his Advair  or nebulizer treatments regularly since discharge. Albuterol  is used infrequently, mainly during walks or when experiencing shortness of breath. The nebulizer has not been used since leaving the hospital.  Advair  has also not been used since his hospitalization.  He has a persistent cough with clear sputum, occasionally yellowish-green, but no hemoptysis. Fatigue is more prominent than shortness of breath, with no fevers, chills, or sweats. His weight has decreased from 166 pounds in October to 148 pounds currently, with poor appetite and early satiety.  Appetite has slightly improved since hospital discharge, with less frequent  nausea.  He experiences back and chest pain, affecting his ability to cough effectively. He recently received a back injection for spinal issues; he reports improvement in back pain after this.  Hospitalization 07/18/2024-07/27/2024: 69 y.o. male with medical history significant for COPD/chronic asthmatic bronchitis and bronchiectasis with chronic respiratory failure with hypoxia on 2 L O2 [place], CAD, T1DM, HTN, HLD, hypothyroidism, BPH, depression/anxiety, chronic pain who presented to the ED for evaluation of shortness of breath.   Patient has a history of recurrent pneumonia as well as aspiration since prior cervical fusion.  He was last admitted September 2025 with acute hypoxic respiratory failure secondary to right-sided pneumonia and COPD exacerbation.  Since then he has been requiring 2 L supplemental O2 via [place] at baseline.  He had initial improvement for the first 2 weeks after discharge but since then has had progressively worsening dyspnea on exertion and cough.   He was seen in his pulmonology office earlier this month and diagnosed with right upper lobe pneumonia seen on chest x-ray 06/25/2024.  He completed in 1 week course of Levaquin .  He has not had any significant improvement.  He is still easily short of breath with minimal exertion.  He has frequent cough, mostly productive of clear sputum and occasionally sees follow-up green sputum.  He has been generally fatigued.  He reports frequent nausea without emesis.  Appetite has been low.  He reports approximately 35 pound weight loss since September.  He has occasional chills without fever or diaphoresis.  He has been having intermittent right upper chest pain, worse with deep inspiration.  Blood cultures no growth  SARS-CoV-2, influenza, RSV PCR negative.  Portable chest x-ray showed worsening right upper lobe pneumonia when  compared to previous CXR 06/25/2024.   TEST/EVENTS : CT chest 07/19/2024 Right upper lobe patchy consolidative airspace  opacities, most consistent with pneumonia. Upper and lower lobe mucous plugging, including within the distal right main bronchus. Recommend follow-up CT in 3 months to evaluate for complete resolution.Enlarged mediastinal lymph nodes, including a 1.3 cm precarinal lymph node. No gross hilar adenopathy with limited evaluation on this noncontrast study.Nonobstructive bilateral nephrolithiasis.   Chest XR 05/03/2024:  IMPRESSION: Interval improvement and near complete resolution of consolidation within the right lung most compatible with resolving pneumonia.  COMPARISON: Chest radiograph 04/13/2024   Allergies[1]  Immunization History  Administered Date(s) Administered   Fluad Quad(high Dose 65+) 04/15/2022   INFLUENZA, HIGH DOSE SEASONAL PF 04/13/2024   Influenza Split 04/19/2011, 05/22/2012, 04/17/2013, 04/18/2017, 03/28/2019   Influenza Whole 07/31/2007, 04/15/2009, 05/08/2010   Influenza, Quadrivalent, Recombinant, Inj, Pf 03/22/2018, 04/28/2020, 03/25/2021   Influenza,inj,Quad PF,6+ Mos 03/19/2015, 05/19/2016   Influenza-Unspecified 03/19/2015, 04/12/2017   PFIZER(Purple Top)SARS-COV-2 Vaccination 09/21/2019, 10/12/2019, 04/13/2020, 10/24/2020, 03/25/2021   PNEUMOCOCCAL CONJUGATE-20 05/01/2023   Pfizer Covid-19 Vaccine Bivalent Booster 22yrs & up 04/09/2021, 12/28/2021   Pfizer(Comirnaty )Fall Seasonal Vaccine 12 years and older 05/21/2024   Pneumococcal Polysaccharide-23 04/19/2003, 09/24/2011, 04/11/2019   Respiratory Syncytial Virus Vaccine ,Recomb Aduvanted(Arexvy ) 06/04/2022   Td 12/22/2000   Tdap 09/24/2011, 08/19/2013   Zoster Recombinant(Shingrix) 09/27/2019, 12/20/2019   Zoster, Live 09/27/2019, 12/20/2019    Past Medical History:  Diagnosis Date   Anxiety    Asthma    ASTHMATIC BRONCHITIS, ACUTE 10/25/2008   Bladder neck obstruction    CARPAL TUNNEL SYNDROME, BILATERAL 07/31/2007   issues resolved, no surgery   Cervical disc disease    CORONARY ARTERY DISEASE  04/03/2007   Depression    DIABETES MELLITUS, TYPE I 04/03/2007   Diabetic retinopathy associated with diabetes mellitus due to underlying condition (HCC) 04/03/2007   DM W/EYE MANIFESTATIONS, TYPE I, UNCONTROLLED 04/04/2007   DM W/RENAL MNFST, TYPE I, UNCONTROLLED 04/04/2007   ED (erectile dysfunction)    History of kidney stones    HYPERLIPIDEMIA 04/04/2007   Hypothyroidism    Pneumonia    several times and again today (02/13/2104)   Renal insufficiency    Seizures (HCC)    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     albuterol  (VENTOLIN  HFA) 108 (90 Base) MCG/ACT inhaler Inhale 2 puffs into the lungs every 6 (six) hours as needed for wheezing or shortness of breath. 6.7 g 3   aspirin  EC 81 MG tablet Take 1 tablet (81 mg total) by mouth daily. Swallow whole. (Patient taking differently: Take 81 mg by mouth at bedtime. Swallow whole.) 30 tablet 11   bisacodyl  (DULCOLAX) 10 MG suppository Place 1 suppository (10 mg total) rectally daily as needed for moderate constipation. 12 suppository 0   cetirizine (ZYRTEC) 10 MG tablet Take 10 mg by mouth at bedtime.     Continuous Blood Gluc Sensor (FREESTYLE LIBRE 3 SENSOR) MISC Change sensor every 14 days to monitor blood glucose continously 2 each 6   Continuous Glucose Sensor (FREESTYLE LIBRE 3 PLUS SENSOR) MISC Inject 1 Device into the skin See admin instructions. Place  a new sensor into the skin every 15 days     finasteride  (PROSCAR ) 5 MG tablet Take 1 tablet (5 mg total) by mouth daily. (Patient taking differently: Take 5 mg by mouth at bedtime.) 90 tablet 3    fluticasone -salmeterol (ADVAIR ) 250-50 MCG/ACT AEPB Inhale 1 puff into the lungs every 12 (twelve) hours. 60 each 11   Glucagon  (GVOKE HYPOPEN  2-PACK) 1 MG/0.2ML SOAJ Inject under the skin as directed for severe hypoglycemia. 0.4 mL 5   Glucagon  (GVOKE HYPOPEN  2-PACK) 1 MG/0.2ML SOAJ Use as directed under the skin for severe hypoglycemia. 0.4 mL 5   glucose blood (CONTOUR NEXT TEST) test strip USE 8 TIMES DAILY AS DIRECTED TO MONITOR BLOOD GLUCOSE 600 strip 4   guaiFENesin  (MUCINEX ) 600 MG 12 hr tablet Take 2 tablets (1,200 mg total) by mouth 2 (two) times daily. 120 tablet 0   HYDROcodone -acetaminophen  (NORCO/VICODIN) 5-325 MG tablet Take 1 tablet by mouth 3 (three) times daily as needed. 60 tablet 0   insulin  aspart (NOVOLOG  FLEXPEN) 100 UNIT/ML FlexPen Inject up to 100 units subcutaneously per Iacobucci as directed (ICR 20, ISF 20, target 120) 90 mL 3   insulin  glargine (LANTUS  SOLOSTAR) 100 UNIT/ML Solostar Pen Inject 25-30 Units into the skin daily as directed (Patient taking differently: Inject 27 Units into the skin daily before breakfast.) 45 mL 3   Insulin  Pen Needle (TECHLITE PEN NEEDLES) 31G X 8 MM MISC Use 1 pen needle 4-5 times daily as directed. 500 each 3   Insulin  Pen Needle 31G X 8 MM MISC Use 1 pen needle subcutaneously 4 - 5 times daily as directed. 500 each 3   Insulin  Syringe-Needle U-100 31G X 5/16 0.3 ML MISC use to inject insulin  6 to 8 times daily 750 each 3   ipratropium-albuterol  (DUONEB) 0.5-2.5 (3) MG/3ML SOLN Inhale 3 mLs into the lungs every 6 (six) hours as needed with nebulizer. 270 mL 1   levothyroxine  (SYNTHROID ) 100 MCG tablet Take 1 tablet (100 mcg total) by mouth daily. (Patient taking differently: Take 100 mcg by mouth at bedtime.) 90 tablet 4   metoCLOPramide  (REGLAN ) 5 MG tablet Take 1 tablet (5 mg total) by mouth 2 (two) times daily before a meal. 180 tablet 4   ondansetron  (ZOFRAN ) 4 MG tablet Take 1 tablet (4 mg total) by mouth every 6 (six) hours as needed for  nausea. 20 tablet 0   pantoprazole  (PROTONIX ) 40 MG tablet Take 1 tablet by mouth daily as directed. (Patient taking differently: Take 40 mg by mouth at bedtime.) 90 tablet 3   polyethylene glycol powder (GLYCOLAX /MIRALAX ) 17 GM/SCOOP powder Take 17 g by mouth 2 (two) times daily. Dissolve 1 capful (17g) in 4-8 ounces of liquid and take by mouth two times daily. 238 g 0   promethazine  (PHENERGAN ) 25 MG tablet Take 1 tablet (25 mg total) by mouth 3 (three) times daily as needed. 90 tablet 3   senna-docusate (SENOKOT-S) 8.6-50 MG tablet Take 3 tablets by mouth at bedtime.     simvastatin  (ZOCOR ) 40 MG tablet Take 1 tablet (40 mg total) by mouth daily. (Patient taking differently: Take 40 mg by mouth at bedtime.) 90 tablet 3   sodium chloride  (OCEAN) 0.65 % SOLN nasal spray Place 1 spray into both nostrils as needed for congestion.     tamsulosin  (FLOMAX ) 0.4 MG CAPS capsule Take 1 capsule (0.4 mg total) by mouth daily. (Patient taking differently: Take 0.4 mg by mouth at bedtime.) 90 capsule 3   traZODone  (  DESYREL ) 150 MG tablet Take 1 tablet (150 mg total) by mouth at bedtime as needed for sleep (Patient taking differently: Take 150 mg by mouth See admin instructions. Take 150 mg by mouth at bedtime) 90 tablet 4   albuterol  (PROVENTIL ) (2.5 MG/3ML) 0.083% nebulizer solution Take 3 mLs by nebulization every 6 (six) hours as needed. 180 mL 3   No facility-administered medications prior to visit.     Review of Systems: as per hpi  Constitutional:   No  weight loss, night sweats,  Fevers, chills, fatigue, or  lassitude.  HEENT:   No headaches,  Difficulty swallowing,  Tooth/dental problems, or  Sore throat,                No sneezing, itching, ear ache, nasal congestion, post nasal drip,   CV:  No chest pain,  Orthopnea, PND, swelling in lower extremities, anasarca, dizziness, palpitations, syncope.   GI  No heartburn, indigestion, abdominal pain, nausea, vomiting, diarrhea, change in bowel  habits, loss of appetite, bloody stools.   Resp: No shortness of breath with exertion or at rest.  No excess mucus, no productive cough,  No non-productive cough,  No coughing up of blood.  No change in color of mucus.  No wheezing.  No chest wall deformity  Skin: no rash or lesions.  GU: no dysuria, change in color of urine, no urgency or frequency.  No flank pain, no hematuria   MS:  No joint pain or swelling.  No decreased range of motion.  No back pain.    Physical Exam  BP 98/64   Pulse 88   Ht 5' 9 (1.753 m)   Wt 148 lb (67.1 kg)   SpO2 94%   BMI 21.86 kg/m   GEN: A/Ox3; pleasant , NAD, well nourished    HEENT:  [place]/AT,  EACs-clear, TMs-wnl, NOSE-clear, THROAT-clear, no lesions, no postnasal drip or exudate noted.   NECK:  Supple w/ fair ROM; no JVD; normal carotid impulses w/o bruits; no thyromegaly or nodules palpated; no lymphadenopathy.    RESP  Scattered rhonchi; clears with cough  P & A; w/o, wheezes/ rales. no accessory muscle use, no dullness to percussion  CARD:  RRR, no m/r/g, no peripheral edema, pulses intact, no cyanosis or clubbing.  GI:   Soft & nt; nml bowel sounds; no organomegaly or masses detected.   Musco: Warm bil, no deformities or joint swelling noted.   Neuro: alert, no focal deficits noted.    Skin: Warm, no lesions or rashes    Lab Results:  CBC    Component Value Date/Time   WBC 9.0 07/23/2024 1108   RBC 3.31 (L) 07/23/2024 1108   HGB 10.1 (L) 07/23/2024 1108   HGB 13.4 06/25/2024 1343   HCT 29.3 (L) 07/23/2024 1108   HCT 40.7 06/25/2024 1343   PLT 357 07/23/2024 1108   PLT 568 (H) 06/25/2024 1343   MCV 88.5 07/23/2024 1108   MCV 93 06/25/2024 1343   MCH 30.5 07/23/2024 1108   MCHC 34.5 07/23/2024 1108   RDW 12.1 07/23/2024 1108   RDW 12.2 06/25/2024 1343   LYMPHSABS 1.0 07/18/2024 2030   LYMPHSABS 1.0 06/25/2024 1343   MONOABS 0.8 07/18/2024 2030   EOSABS 0.3 07/18/2024 2030   EOSABS 0.3 06/25/2024 1343   BASOSABS 0.1  07/18/2024 2030   BASOSABS 0.1 06/25/2024 1343    BMET    Component Value Date/Time   NA 136 07/23/2024 1108   NA 137 06/25/2024 1343  K 4.0 07/23/2024 1108   CL 98 07/23/2024 1108   CO2 28 07/23/2024 1108   GLUCOSE 224 (H) 07/23/2024 1108   BUN 9 07/23/2024 1108   BUN 18 06/25/2024 1343   CREATININE 0.62 07/23/2024 1108   CALCIUM 8.9 07/23/2024 1108   GFRNONAA >60 07/23/2024 1108   GFRAA >60 12/02/2019 0912    BNP    Component Value Date/Time   BNP 48.7 06/30/2019 0619    ProBNP    Component Value Date/Time   PROBNP 190.0 07/18/2024 2030   PROBNP 42 07/31/2021 1120    Imaging: CT ABDOMEN PELVIS WO CONTRAST Result Date: 07/22/2024 CLINICAL DATA:  Weight loss and nausea EXAM: CT ABDOMEN AND PELVIS WITHOUT CONTRAST TECHNIQUE: Multidetector CT imaging of the abdomen and pelvis was performed following the standard protocol without IV contrast. RADIATION DOSE REDUCTION: This exam was performed according to the departmental dose-optimization program which includes automated exposure control, adjustment of the mA and/or kV according to patient size and/or use of iterative reconstruction technique. COMPARISON:  CT chest dated 07/19/2024, CT abdomen and pelvis dated 12/14/2012 FINDINGS: Lower chest: Increased right lower lobe tree-in-bud nodules. Partially imaged right lower lobe subsegmental mucous plugging. Left lower lobe linear atelectasis/scarring. Increased small right pleural effusion. Partially imaged heart size is normal. Hepatobiliary: No focal hepatic lesions. No intra or extrahepatic biliary ductal dilation. Normal gallbladder. Pancreas: No focal lesions or main ductal dilation. Spleen: Normal in size.  Scattered calcified splenic granulomata. Adrenals/Urinary Tract: No adrenal nodules. Duplex left kidney. No suspicious renal masses by noncontrast technique. No hydronephrosis. Bilateral nonobstructing stones measuring up to 4 mm in the left upper lobe. No focal bladder wall  thickening. Stomach/Bowel: Normal appearance of the stomach. No evidence of bowel wall thickening, distention, or inflammatory changes. Moderate volume stool throughout the colon. Colonic diverticulosis without acute diverticulitis. Appendix is not discretely seen. Vascular/Lymphatic: Aortic atherosclerosis. No enlarged abdominal or pelvic lymph nodes. Reproductive: Prostate is unremarkable. Other: No free fluid, fluid collection, or free air. Musculoskeletal: No acute or abnormal lytic or blastic osseous lesions. Multilevel degenerative changes of the partially imaged thoracic and lumbar spine. Subcutaneous emphysema and soft tissue stranding in the left anterolateral lower abdominal wall, likely injection related. IMPRESSION: 1. No acute abdominopelvic abnormality. 2. Increased right lower lobe tree-in-bud nodules with partially imaged right lower lobe subsegmental mucous plugging, likely infectious/inflammatory. 3. Increased small right pleural effusion. 4. Findings of constipation with moderate volume stool throughout the colon. 5. Bilateral nonobstructing renal stones measuring up to 4 mm in the left upper lobe. 6. Colonic diverticulosis without acute diverticulitis. 7.  Aortic Atherosclerosis (ICD10-I70.0). Electronically Signed   By: Limin  Xu M.D.   On: 07/22/2024 12:52   CT CHEST WO CONTRAST Result Date: 07/19/2024 EXAM: CT CHEST WITHOUT CONTRAST 07/19/2024 09:55:19 AM TECHNIQUE: CT of the chest was performed without the administration of intravenous contrast. Multiplanar reformatted images are provided for review. Automated exposure control, iterative reconstruction, and/or weight based adjustment of the mA/kV was utilized to reduce the radiation dose to as low as reasonably achievable. COMPARISON: None available. CLINICAL HISTORY: Pneumonia, complication suspected, xray done; recurrent RUL pneumonia. FINDINGS: MEDIASTINUM: Heart: At least 3-vessel coronary artery calcifications. Aortic valve leaflet  calcification. Pericardium is unremarkable. The central airways demonstrate mucous plugging noted within the distal bronchus. Mild-to-moderate atherosclerotic plaque. LYMPH NODES: Enlarged mediastinal lymph nodes, including a 1.3 cm precarinal lymph node. No gross hilar adenopathy with markedly limited evaluation on this noncontrast study and associated adjacent lung disease. No axillary lymphadenopathy. LUNGS  AND PLEURA: Right upper lobe patchy consolidative airspace opacities. Upper and lower lobe mucous plugging. No pulmonary edema. No pleural effusion or pneumothorax. SOFT TISSUES/BONES: Old healed bilateral posterior rib fractures. No acute abnormality of the bones or soft tissues. UPPER ABDOMEN: Limited images of the upper abdomen demonstrate a tiny hiatal hernia. Bilateral nephrolithiasis with a 7 mm left stone and punctate right stones noted. IMPRESSION: 1. Right upper lobe patchy consolidative airspace opacities, most consistent with pneumonia. Upper and lower lobe mucous plugging, including within the distal right main bronchus. Recommend follow-up CT in 3 months to evaluate for complete resolution. 2. Enlarged mediastinal lymph nodes, including a 1.3 cm precarinal lymph node. No gross hilar adenopathy with limited evaluation on this noncontrast study. 3. Nonobstructive bilateral nephrolithiasis. Electronically signed by: Morgane Naveau MD 07/19/2024 11:15 AM EST RP Workstation: [id number]   DG Chest Port 1 View Result Date: 07/18/2024 EXAM: 1 VIEW(S) XRAY OF THE CHEST 07/18/2024 08:03:00 PM COMPARISON: 06/25/2024 CLINICAL HISTORY: Cough, Dyspnea, recent pneumonia. FINDINGS: LUNGS AND PLEURA: Progression of right upper lobe airspace opacity consistent with worsening pneumonia. Follow up to resolution recommended. No pleural effusion. No pneumothorax. HEART AND MEDIASTINUM: No acute abnormality of the cardiac and mediastinal silhouettes. BONES AND SOFT TISSUES: No acute osseous abnormality. IMPRESSION:  1. Worsening right upper lobe pneumonia. Follow-up to resolution recommended. Electronically signed by: Vanetta Chou MD 07/18/2024 08:41 PM EST RP Workstation: [id number]    Administration History     None          Latest Ref Rng & Units 06/25/2024   10:59 AM 09/18/2018   10:21 AM 05/22/2015   10:01 AM  PFT Results  FVC-Pre L 2.00  3.17  3.41   FVC-Predicted Pre % 46  70  74   FVC-Post L 2.23  3.35  3.28   FVC-Predicted Post % 51  74  71   Pre FEV1/FVC % % 63  75  74   Post FEV1/FCV % % 64  76  77   FEV1-Pre L 1.25  2.39  2.52   FEV1-Predicted Pre % 39  70  72   FEV1-Post L 1.42  2.56  2.53   DLCO uncorrected ml/min/mmHg 13.26   22.74   DLCO UNC% % 52   73   DLVA Predicted % 85   97   TLC L 5.53   5.50   TLC % Predicted % 81   80   RV % Predicted % 147   113     Lab Results  Component Value Date   NITRICOXIDE 11 01/26/2017     Assessment & Plan:   Assessment & Plan Community acquired pneumonia of right middle lobe of lung  Asthma, persistent controlled  Community acquired pneumonia of right lower lobe of lung  Chronic respiratory failure with hypoxia (HCC)  Assessment and Plan Assessment & Plan Recurrent right upper lobe pneumonia due to Pseudomonas with mucus plugging and airway collapse CT scan from January 1st shows extensive pneumonia throughout the right lung with mucus plugging and airway collapse. Sputum culture isolated Pseudomonas, which was not fully eradicated by oral Levaquin , necessitating IV antibiotics (Zosyn ) during hospitalization. Persistent symptoms include fatigue, shortness of breath, and productive cough with clear sputum, occasionally yellowish-green. Differential includes biofilm formation or mucus plugging preventing complete resolution vs postobstructive pneumonia from endobronchial lesion.  - Bronchoscopy discussed, with risks including anesthesia complications and aspiration.  Bronchoscopy benefits would be isolate of deep sputum  culture, lavage to remove mucus plugging, and potential biopsy  if endobronchial lesion is present.  Patient prefers to pursue least invasive measures.   - Plan for aggressive pulmonary toilet: - Resume Advair  twice daily. - Use albuterol  nebulizer twice daily followed by 3% saline nebulizer. - Perform flutter valve technique with nebulizer treatments. - Use Mucinex  twice daily. - Repeat CT scan in April. - Reconsider bronchoscopy if symptoms persist or worsen.  Chronic respiratory failure with hypoxemia Chronic respiratory failure with hypoxemia, exacerbated by recurrent pneumonia. Oxygen  saturation fluctuates, with some improvement on supplemental oxygen . Symptoms include fatigue and shortness of breath, particularly with activity. Oxygen  use provides subjective comfort as well. - Continue supplemental oxygen  as needed for comfort and hypoxemia. - Monitor oxygen  saturation, especially during activity.  Unintentional weight loss and poor appetite Significant unintentional weight loss with poor appetite and early satiety. Weight loss likely multifactorial, related to recurrent pneumonia, hospitalization, and decreased oral intake. Some improvement in appetite and reduction in nausea since hospital discharge. - Monitor weight and nutritional intake. - Encourage PO protein intake.    Return in about 4 months (around 12/05/2024) for CT results.  RTC sooner if new or worsening symptoms.  Patient/wife are active in Mychart.  Candis Dandy, PA-C 08/07/2024      [1]  Allergies Allergen Reactions   Augmentin  [Amoxicillin -Pot Clavulanate] Nausea And Vomiting   Ivp Dye [Iodinated Contrast Media] Other (See Comments)    Dizziness/sweating ALSO   Lexapro [Escitalopram Oxalate] Itching  [2]  Social History Tobacco Use  Smoking Status Former   Current packs/[place]: 0.00   Average packs/[place]: 1 pack/[place] for 40.0 years (40.0 ttl pk-yrs)   Types: Cigarettes   Start date: 08/20/1979   Quit date:  08/20/2019   Years since quitting: 4.9   Passive exposure: Past  Smokeless Tobacco Never  Tobacco Comments   Successfully quit smoking 08/20/2019   "

## 2024-08-08 ENCOUNTER — Other Ambulatory Visit (HOSPITAL_COMMUNITY): Payer: Self-pay

## 2024-08-08 ENCOUNTER — Telehealth (HOSPITAL_BASED_OUTPATIENT_CLINIC_OR_DEPARTMENT_OTHER): Payer: Self-pay

## 2024-08-08 ENCOUNTER — Other Ambulatory Visit (HOSPITAL_BASED_OUTPATIENT_CLINIC_OR_DEPARTMENT_OTHER): Payer: Self-pay

## 2024-08-08 MED ORDER — LEVOFLOXACIN 750 MG PO TABS
750.0000 mg | ORAL_TABLET | Freq: Every day | ORAL | 0 refills | Status: DC
  Filled 2024-08-08: qty 14, 14d supply, fill #0

## 2024-08-08 NOTE — Progress Notes (Signed)
 PCP reports patient is worsening.  Given history of pan-sensitive Pseudomonas and prior response to Levaquin : sent in Rx for Levaquin  x 2 weeks.  Plan for follow up in 1-2 weeks.

## 2024-08-08 NOTE — Telephone Encounter (Signed)
 Message sent to patient:  Good afternoon James [name]-   I have sent in a prescription for Levaquin  for 14 days to your pharmacy.  Plan for follow up with our clinic in 1-2 weeks to reassess your condition.   Best,   Candis

## 2024-08-10 ENCOUNTER — Other Ambulatory Visit (HOSPITAL_COMMUNITY): Payer: Self-pay

## 2024-08-10 MED ORDER — LEVOFLOXACIN 500 MG PO TABS
500.0000 mg | ORAL_TABLET | Freq: Two times a day (BID) | ORAL | 0 refills | Status: DC
  Filled 2024-08-10 – 2024-08-11 (×2): qty 14, 7d supply, fill #0

## 2024-08-11 ENCOUNTER — Other Ambulatory Visit (HOSPITAL_COMMUNITY): Payer: Self-pay

## 2024-08-14 ENCOUNTER — Encounter (HOSPITAL_BASED_OUTPATIENT_CLINIC_OR_DEPARTMENT_OTHER): Payer: Self-pay | Admitting: Emergency Medicine

## 2024-08-14 ENCOUNTER — Encounter (HOSPITAL_BASED_OUTPATIENT_CLINIC_OR_DEPARTMENT_OTHER): Payer: Self-pay

## 2024-08-14 ENCOUNTER — Ambulatory Visit (INDEPENDENT_AMBULATORY_CARE_PROVIDER_SITE_OTHER)

## 2024-08-14 ENCOUNTER — Ambulatory Visit: Payer: Self-pay

## 2024-08-14 ENCOUNTER — Telehealth (HOSPITAL_BASED_OUTPATIENT_CLINIC_OR_DEPARTMENT_OTHER): Payer: Self-pay

## 2024-08-14 ENCOUNTER — Encounter (HOSPITAL_BASED_OUTPATIENT_CLINIC_OR_DEPARTMENT_OTHER): Payer: Self-pay | Admitting: *Deleted

## 2024-08-14 VITALS — BP 101/53 | HR 99 | Ht 69.0 in | Wt 147.0 lb

## 2024-08-14 DIAGNOSIS — J151 Pneumonia due to Pseudomonas: Secondary | ICD-10-CM | POA: Diagnosis not present

## 2024-08-14 DIAGNOSIS — J189 Pneumonia, unspecified organism: Secondary | ICD-10-CM | POA: Insufficient documentation

## 2024-08-14 DIAGNOSIS — R630 Anorexia: Secondary | ICD-10-CM

## 2024-08-14 DIAGNOSIS — J9611 Chronic respiratory failure with hypoxia: Secondary | ICD-10-CM | POA: Diagnosis not present

## 2024-08-14 DIAGNOSIS — J45998 Other asthma: Secondary | ICD-10-CM

## 2024-08-14 DIAGNOSIS — Z87891 Personal history of nicotine dependence: Secondary | ICD-10-CM | POA: Diagnosis not present

## 2024-08-14 NOTE — Telephone Encounter (Signed)
 Please schedule the following:   Provider performing procedure:  [place]rum; or first available Diagnosis: recurrent right upper lobe pneumonia Which side if for nodule / mass? N/a Procedure: bronch with ebus  Has patient been spoken to by Provider and given informed consent? yes Anesthesia: yes; general Do you need Fluro? yes Duration of procedure: <1 hour Date: 09/04/2024 Alternate Date: 09/03/2024  Time: AM/ PM first available Location: [facility] Does patient have OSA? no DM? yes Or Latex allergy? no Medication Restriction/ Anticoagulate/Antiplatelet: aspirin  Pre-op Labs Ordered:determined by Anesthesia Imaging request: n/a  (If, SuperDimension CT Chest, please have STAT courier sent to ENDO)

## 2024-08-14 NOTE — Progress Notes (Signed)
 Please schedule the following:   Provider performing procedure:  Byrum; or first available Diagnosis: recurrent right upper lobe pneumonia Which side if for nodule / mass? N/a Procedure: bronch with ebus  Has patient been spoken to by Provider and given informed consent? yes Anesthesia: yes; general Do you need Fluro? yes Duration of procedure: <1 hour Date: 09/04/2024 Alternate Date: 09/03/2024  Time: AM/ PM first available Location: [facility] Does patient have OSA? no DM? yes Or Latex allergy? no Medication Restriction/ Anticoagulate/Antiplatelet: aspirin  Pre-op Labs Ordered:determined by Anesthesia Imaging request: n/a  (If, SuperDimension CT Chest, please have STAT courier sent to ENDO)

## 2024-08-14 NOTE — Telephone Encounter (Signed)
 Letter finished and Tizza will let the patient know about the details. Rounting to  [facility] for authorization for case # [id number]

## 2024-08-14 NOTE — Telephone Encounter (Signed)
" °  Error, see nurse triage note in chart.      This encounter was created in error - please disregard. "

## 2024-08-14 NOTE — Telephone Encounter (Signed)
 Appt notes

## 2024-08-14 NOTE — Telephone Encounter (Signed)
 FYI Only or Action Required?: FYI only for provider: appointment scheduled on 08/14/24.  Patient is followed in Pulmonology for Pneumonia, last seen on 08/07/2024 by Charley Conger, PA-C.  Called Nurse Triage reporting Cough.  Symptoms began about a month ago.  Interventions attempted: Prescription medications: Promethazine  DM, Rescue inhaler, Nebulizer treatments, and Home oxygen  use.  Symptoms are: unchanged.  Triage Disposition: See PCP When Office is Open (Within 3 Days)  Patient/caregiver understands and will follow disposition?: Yes    E2C2 Pulmonary Triage - Initial Assessment Questions Chief Complaint (e.g., cough, sob, wheezing, fever, chills, sweat or additional symptoms) *Go to specific symptom protocol after initial questions. Cough  How long have symptoms been present? Ongoing since December  Have you tested for COVID or Flu? Note: If not, ask patient if a home test can be taken. If so, instruct patient to call back for positive results. No  MEDICINES:   Have you used any OTC meds to help with symptoms? No If yes, ask What medications? N/A  Have you used your inhalers/maintenance medication? Yes If yes, What medications? albuterol   If inhaler, ask How many puffs and how often? Note: Review instructions on medication in the chart. 2 puffs as needed, not every [place]  OXYGEN : Do you wear supplemental oxygen ? Yes If yes, How many liters are you supposed to use? 2-2.5 L or 3 L, not more  Do you monitor your oxygen  levels? Yes If yes, What is your reading (oxygen  level) today? 95-97 on oxygen   What is your usual oxygen  saturation reading?  (Note: Pulmonary O2 sats should be 90% or greater) 95%     Reason for Triage: Patient was seen in clinic last week and was told to follow up in 1-2 weeks. Patient has worsening cough and had burning in his chest and throat after a nebulizer treatment yesterday. Warm transfer to [facility]    Reason for  Disposition  Cough has been present for > 3 weeks  Answer Assessment - Initial Assessment Questions Patient's wife called and says that he's not doing well, still coughing more and is not complaining of burning in the chest and throat. She says she saw a Clinical Cytogeneticist message from [place] to f/u in 1-2 weeks, so that's why she's calling to schedule that visit today if possible. I asked about symptoms such as SOB, she denies him with SOB. She says he's wearing his oxygen  more that was ordered prn because he says it makes him feel better wearing it. She says he's using the promethazine  DM for cough. Oxygen  is around 2-3 L, not real sure but no more than 3 L, sats 95-97% on oxygen , unsure of sats off oxygen  but says her husband checks is frequently. She says he used the albuterol  nebulizer once on yesterday and it makes him feel jittery, HR increases when using it, so it's not used often. She says he uses his inhaler sometime, but not every [place]. Denies fever or other symptoms. She's concerned regarding the ongoing pneumonia. Advised visit available today at 3p, she agreed to that appointment with Conger once advised Dr. Jude doesn't have availability until March.    1. ONSET: When did the cough begin?      Ongoing since December  2. SEVERITY: How bad is the cough today?      Goes in waves, sometimes a few times in an hour  3. SPUTUM: Describe the color of your sputum (e.g., none, dry cough; clear, white, yellow, green)     Unknown  4. HEMOPTYSIS: Are you coughing up any blood? If Yes, ask: How much? (e.g., flecks, streaks, tablespoons, etc.)     Unknown  5. DIFFICULTY BREATHING: Are you having difficulty breathing? If Yes, ask: How bad is it? (e.g., mild, moderate, severe)      No  6. FEVER: Do you have a fever? If Yes, ask: What is your temperature, how was it measured, and when did it start?     No    8. LUNG HISTORY: Do you have any history of lung disease?  (e.g., pulmonary  embolus, asthma, emphysema)     Pneumonia ongoing since December    10. OTHER SYMPTOMS: Do you have any other symptoms? (e.g., runny nose, wheezing, chest pain)       Chest burning, throat burning    12. TRAVEL: Have you traveled out of the country in the last month? (e.g., travel history, exposures)       No  Protocols used: Cough - Acute Productive-A-AH

## 2024-08-14 NOTE — Progress Notes (Signed)
 "  @Patient  ID: James [name], male    DOB: March 24, 1956, 69 y.o.   MRN: [id number]  Chief Complaint  Patient presents with   Acute Visit    Referring provider: Nichole Senior, MD  HPI: Discussed the use of AI scribe software for clinical note transcription with the patient, who gave verbal consent to proceed.  History of Present Illness James [name] is a 69 year old male with PMH of asthma and recurrent pneumonia who presents with worsening respiratory symptoms and concerns about recent nebulizer treatment effects.  He was seen last week on January 20th, and a follow-up with his diabetic educator led to a chest x-ray due to concerns raised during the visit.  Last night he started nebulizer treatments with albuterol  and saline to assist with expectoration, which caused a severe burning sensation in his chest, shaking, and a feeling of rawness in his throat. These symptoms persisted for three to four hours, and he has not slept since the night before last. He feels extremely weak, dizzy, and has shoulder pain. He describes feeling 'like death warmed over' and experiences fluctuations in body temperature, feeling hot and cold without actual sweats.  His oxygen  saturation levels, when not using supplemental oxygen , range from 94% to 97% at home. He feels weak and dizzy, especially after activities such as showering, but no shortness of breath during these episodes and oxygen  levels have remained normal during these times. He has been coughing more frequently and was given cough medicine the previous night.  He has been on Levaquin  750 mg once daily for the last 4 days, prescribed last week after he had a Chest XR that showed worsening of his RUL pneumonia. He felt better since starting the antibiotics, but had some worsening overall in how he feels after the nebulizer last night.  He has a history of pneumonia and has been evaluated for swallowing issues in the past. He cannot sleep on his right side  due to pain and difficulty breathing, typically sleeping on his left side or back while in a recliner. He has experienced significant weight loss, lack of appetite, and nausea, which he attributes to his ongoing respiratory issues.  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 He has used a Flutter device in the past to help with mucus clearance but had not been using it recently.  He reports no night sweats, but has had weight loss and lack of appetite over the last 4-6 months. He has been bringing up green sputum intermittently.  LOV 08/07/2024: James [name] is a 69 year old male with recurrent pneumonia who presents for hospital follow up after an admission for sepsis 2nd to pneumonia.  Sputum cultures in the hospital revealed Pseudomonas aeruginosa sensitive to cipro  and Zosyn .    He has a history of recurrent pneumonia, with the most recent episode resulting in a [facility] hospitalization. Initial treatment with Levaquin  provided minimal improvement, and a chest x-ray during hospitalization showed worsening pneumonia. He received multiple antibiotics, including Zosyn , and a sputum sample identified Pseudomonas as the causative organism.   Since discharge, he experiences persistent weakness, a weak voice, and fluctuating oxygen  levels. He sometimes uses supplemental oxygen , which he finds beneficial, despite oxygen  saturation levels being between 92-94% on room air. Significant fatigue is present, requiring rest after minimal activity such as showering.   He has not been using  his Advair  or nebulizer treatments regularly since discharge. Albuterol  is used infrequently, mainly during walks or when experiencing shortness of breath. The nebulizer has not been used since leaving the hospital.  Advair  has also not been used since his hospitalization.   He has a persistent cough with clear sputum, occasionally  yellowish-green, but no hemoptysis. Fatigue is more prominent than shortness of breath, with no fevers, chills, or sweats. His weight has decreased from 166 pounds in October to 148 pounds currently, with poor appetite and early satiety.   Appetite has slightly improved since hospital discharge, with less frequent nausea.   He experiences back and chest pain, affecting his ability to cough effectively. He recently received a back injection for spinal issues; he reports improvement in back pain after this.   Hospitalization 07/18/2024-07/27/2024: 69 y.o. male with medical history significant for COPD/chronic asthmatic bronchitis and bronchiectasis with chronic respiratory failure with hypoxia on 2 L O2 [place], CAD, T1DM, HTN, HLD, hypothyroidism, BPH, depression/anxiety, chronic pain who presented to the ED for evaluation of shortness of breath.   Patient has a history of recurrent pneumonia as well as aspiration since prior cervical fusion.  He was last admitted September 2025 with acute hypoxic respiratory failure secondary to right-sided pneumonia and COPD exacerbation.  Since then he has been requiring 2 L supplemental O2 via [place] at baseline.  He had initial improvement for the first 2 weeks after discharge but since then has had progressively worsening dyspnea on exertion and cough.   He was seen in his pulmonology office earlier this month and diagnosed with right upper lobe pneumonia seen on chest x-ray 06/25/2024.  He completed in 1 week course of Levaquin .  He has not had any significant improvement.  He is still easily short of breath with minimal exertion.  He has frequent cough, mostly productive of clear sputum and occasionally sees follow-up green sputum.  He has been generally fatigued.  He reports frequent nausea without emesis.  Appetite has been low.  He reports approximately 35 pound weight loss since September.  He has occasional chills without fever or diaphoresis.  He has been having  intermittent right upper chest pain, worse with deep inspiration.  Blood cultures no growth  SARS-CoV-2, influenza, RSV PCR negative.  Portable chest x-ray showed worsening right upper lobe pneumonia when compared to previous CXR 06/25/2024.     TEST/EVENTS : CT chest 07/19/2024 Right upper lobe patchy consolidative airspace opacities, most consistent with pneumonia. Upper and lower lobe mucous plugging, including within the distal right main bronchus. Recommend follow-up CT in 3 months to evaluate for complete resolution.Enlarged mediastinal lymph nodes, including a 1.3 cm precarinal lymph node. No gross hilar adenopathy with limited evaluation on this noncontrast study.Nonobstructive bilateral nephrolithiasis.    Chest XR 05/03/2024:  IMPRESSION: Interval improvement and near complete resolution of consolidation within the right lung most compatible with resolving pneumonia.  COMPARISON: Chest radiograph 04/13/2024   Allergies[1]  Immunization History  Administered Date(s) Administered   Fluad Quad(high Dose 65+) 04/15/2022   INFLUENZA, HIGH DOSE SEASONAL PF 04/13/2024   Influenza Split 04/19/2011, 05/22/2012, 04/17/2013, 04/18/2017, 03/28/2019   Influenza Whole 07/31/2007, 04/15/2009, 05/08/2010   Influenza, Quadrivalent, Recombinant, Inj, Pf 03/22/2018, 04/28/2020, 03/25/2021   Influenza,inj,Quad PF,6+ Mos 03/19/2015, 05/19/2016   Influenza-Unspecified 03/19/2015, 04/12/2017   PFIZER(Purple Top)SARS-COV-2 Vaccination 09/21/2019, 10/12/2019, 04/13/2020, 10/24/2020, 03/25/2021   PNEUMOCOCCAL CONJUGATE-20 05/01/2023   Pfizer Covid-19 Vaccine Bivalent Booster 54yrs & up 04/09/2021, 12/28/2021   Pfizer(Comirnaty )Fall Seasonal Vaccine 12 years and older  05/21/2024   Pneumococcal Polysaccharide-23 04/19/2003, 09/24/2011, 04/11/2019   Respiratory Syncytial Virus Vaccine ,Recomb Aduvanted(Arexvy ) 06/04/2022   Td 12/22/2000   Tdap 09/24/2011, 08/19/2013   Zoster Recombinant(Shingrix)  09/27/2019, 12/20/2019   Zoster, Live 09/27/2019, 12/20/2019    Past Medical History:  Diagnosis Date   Anxiety    Asthma    ASTHMATIC BRONCHITIS, ACUTE 10/25/2008   Bladder neck obstruction    CARPAL TUNNEL SYNDROME, BILATERAL 07/31/2007   issues resolved, no surgery   Cervical disc disease    CORONARY ARTERY DISEASE 04/03/2007   Depression    DIABETES MELLITUS, TYPE I 04/03/2007   Diabetic retinopathy associated with diabetes mellitus due to underlying condition (HCC) 04/03/2007   DM W/EYE MANIFESTATIONS, TYPE I, UNCONTROLLED 04/04/2007   DM W/RENAL MNFST, TYPE I, UNCONTROLLED 04/04/2007   ED (erectile dysfunction)    History of kidney stones    HYPERLIPIDEMIA 04/04/2007   Hypothyroidism    Pneumonia    several times and again today (02/13/2104)   Renal insufficiency    Seizures (HCC)    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     albuterol  (PROVENTIL ) (2.5 MG/3ML) 0.083% nebulizer solution Take 3 mLs by nebulization every 6 (six) hours as needed. 180 mL 3   albuterol  (VENTOLIN  HFA) 108 (90 Base) MCG/ACT inhaler Inhale 2 puffs into the lungs every 6 (six) hours as needed for wheezing or shortness of breath. 6.7 g 3   aspirin  EC 81 MG tablet Take 1 tablet (81 mg total) by mouth daily. Swallow whole. (Patient taking differently: Take 81 mg by mouth at bedtime. Swallow whole.) 30 tablet 11   bisacodyl  (DULCOLAX) 10 MG suppository Place 1 suppository (10 mg total) rectally daily as needed for moderate constipation. 12 suppository 0   cetirizine (ZYRTEC) 10 MG tablet Take 10 mg by mouth at bedtime.      Continuous Blood Gluc Sensor (FREESTYLE LIBRE 3 SENSOR) MISC Change sensor every 14 days to monitor blood glucose continously 2 each 6   Continuous Glucose Sensor (FREESTYLE LIBRE 3 PLUS SENSOR) MISC Inject 1 Device into the skin See admin instructions. Place a new sensor into the skin every 15 days     finasteride  (PROSCAR ) 5 MG tablet Take 1 tablet (5 mg total) by mouth daily. (Patient taking differently: Take 5 mg by mouth at bedtime.) 90 tablet 3   fluticasone -salmeterol (ADVAIR ) 250-50 MCG/ACT AEPB Inhale 1 puff into the lungs every 12 (twelve) hours. 60 each 11   Glucagon  (GVOKE HYPOPEN  2-PACK) 1 MG/0.2ML SOAJ Inject under the skin as directed for severe hypoglycemia. 0.4 mL 5   Glucagon  (GVOKE HYPOPEN  2-PACK) 1 MG/0.2ML SOAJ Use as directed under the skin for severe hypoglycemia. 0.4 mL 5   glucose blood (CONTOUR NEXT TEST) test strip USE 8 TIMES DAILY AS DIRECTED TO MONITOR BLOOD GLUCOSE 600 strip 4   guaiFENesin  (MUCINEX ) 600 MG 12 hr tablet Take 2 tablets (1,200 mg total) by mouth 2 (two) times daily. 120 tablet 0   HYDROcodone -acetaminophen  (NORCO/VICODIN) 5-325 MG tablet Take 1 tablet by mouth 3 (three) times daily as needed. 60 tablet 0   insulin  aspart (NOVOLOG  FLEXPEN)  100 UNIT/ML FlexPen Inject up to 100 units subcutaneously per [place] as directed (ICR 20, ISF 20, target 120) 90 mL 3   insulin  glargine (LANTUS  SOLOSTAR) 100 UNIT/ML Solostar Pen Inject 25-30 Units into the skin daily as directed (Patient taking differently: Inject 27 Units into the skin daily before breakfast.) 45 mL 3   Insulin  Pen Needle (TECHLITE PEN NEEDLES) 31G X 8 MM MISC Use 1 pen needle 4-5 times daily as directed. 500 each 3   Insulin  Pen Needle 31G X 8 MM MISC Use 1 pen needle subcutaneously 4 - 5 times daily as directed. 500 each 3   Insulin  Syringe-Needle U-100 31G X 5/16 0.3 ML MISC use to inject insulin  6 to 8 times daily 750 each 3   ipratropium-albuterol  (DUONEB) 0.5-2.5 (3) MG/3ML SOLN Inhale 3 mLs into  the lungs every 6 (six) hours as needed with nebulizer. 270 mL 1   levofloxacin  (LEVAQUIN ) 500 MG tablet Take 1 tablet (500 mg total) by mouth 2 (two) times daily. 14 tablet 0   levofloxacin  (LEVAQUIN ) 750 MG tablet Take 1 tablet (750 mg total) by mouth daily for 14 days. 14 tablet 0   levothyroxine  (SYNTHROID ) 100 MCG tablet Take 1 tablet (100 mcg total) by mouth daily. (Patient taking differently: Take 100 mcg by mouth at bedtime.) 90 tablet 4   metoCLOPramide  (REGLAN ) 5 MG tablet Take 1 tablet (5 mg total) by mouth 2 (two) times daily before a meal. 180 tablet 4   ondansetron  (ZOFRAN ) 4 MG tablet Take 1 tablet (4 mg total) by mouth every 6 (six) hours as needed for nausea. 20 tablet 0   pantoprazole  (PROTONIX ) 40 MG tablet Take 1 tablet by mouth daily as directed. (Patient taking differently: Take 40 mg by mouth at bedtime.) 90 tablet 3   polyethylene glycol powder (GLYCOLAX /MIRALAX ) 17 GM/SCOOP powder Take 17 g by mouth 2 (two) times daily. Dissolve 1 capful (17g) in 4-8 ounces of liquid and take by mouth two times daily. 238 g 0   promethazine  (PHENERGAN ) 25 MG tablet Take 1 tablet (25 mg total) by mouth 3 (three) times daily as needed. 90 tablet 3   senna-docusate (SENOKOT-S) 8.6-50 MG tablet Take 3 tablets by mouth at bedtime.     simvastatin  (ZOCOR ) 40 MG tablet Take 1 tablet (40 mg total) by mouth daily. (Patient taking differently: Take 40 mg by mouth at bedtime.) 90 tablet 3   sodium chloride  (OCEAN) 0.65 % SOLN nasal spray Place 1 spray into both nostrils as needed for congestion.     sodium chloride  HYPERTONIC 3 % nebulizer solution Use in nebulizer as directed 750 mL 12   tamsulosin  (FLOMAX ) 0.4 MG CAPS capsule Take 1 capsule (0.4 mg total) by mouth daily. (Patient taking differently: Take 0.4 mg by mouth at bedtime.) 90 capsule 3   traZODone  (DESYREL ) 150 MG tablet Take 1 tablet (150 mg total) by mouth at bedtime as needed for sleep (Patient taking differently: Take 150 mg by mouth See  admin instructions. Take 150 mg by mouth at bedtime) 90 tablet 4   No facility-administered medications prior to visit.     Review of Systems:   Constitutional:   No  weight loss, night sweats,  Fevers, chills, fatigue, or  lassitude.  HEENT:   No headaches,  Difficulty swallowing,  Tooth/dental problems, or  Sore throat,                No sneezing, itching, ear ache, nasal congestion, post nasal drip,  CV:  No chest pain,  Orthopnea, PND, swelling in lower extremities, anasarca, dizziness, palpitations, syncope.   GI  No heartburn, indigestion, abdominal pain, nausea, vomiting, diarrhea, change in bowel habits, loss of appetite, bloody stools.   Resp: No shortness of breath with exertion or at rest.  No excess mucus, no productive cough,  No non-productive cough,  No coughing up of blood.  No change in color of mucus.  No wheezing.  No chest wall deformity  Skin: no rash or lesions.  GU: no dysuria, change in color of urine, no urgency or frequency.  No flank pain, no hematuria   MS:  No joint pain or swelling.  No decreased range of motion.  No back pain.    Physical Exam  BP (!) 101/53   Pulse 99   Ht 5' 9 (1.753 m)   Wt 147 lb (66.7 kg)   SpO2 97% Comment: 2 liters  BMI 21.71 kg/m   GEN: A/Ox3; pleasant , NAD, well nourished    HEENT:  [place]/AT,  EACs-clear, TMs-wnl, NOSE-clear, THROAT-clear, no lesions, no postnasal drip or exudate noted.   NECK:  Supple w/ fair ROM; no JVD; normal carotid impulses w/o bruits; no thyromegaly or nodules palpated; no lymphadenopathy.    RESP  Few rhonchi; improves with cough P & A; w/o, wheezes/ rales. no accessory muscle use, no dullness to percussion  CARD:  RRR, no m/r/g, no peripheral edema, pulses intact, no cyanosis or clubbing.  GI:   Soft & nt; nml bowel sounds; no organomegaly or masses detected.   Musco: Warm bil, no deformities or joint swelling noted.   Neuro: alert, no focal deficits noted.    Skin: Warm, no lesions  or rashes    Lab Results:  CBC    Component Value Date/Time   WBC 9.0 07/23/2024 1108   RBC 3.31 (L) 07/23/2024 1108   HGB 10.1 (L) 07/23/2024 1108   HGB 13.4 06/25/2024 1343   HCT 29.3 (L) 07/23/2024 1108   HCT 40.7 06/25/2024 1343   PLT 357 07/23/2024 1108   PLT 568 (H) 06/25/2024 1343   MCV 88.5 07/23/2024 1108   MCV 93 06/25/2024 1343   MCH 30.5 07/23/2024 1108   MCHC 34.5 07/23/2024 1108   RDW 12.1 07/23/2024 1108   RDW 12.2 06/25/2024 1343   LYMPHSABS 1.0 07/18/2024 2030   LYMPHSABS 1.0 06/25/2024 1343   MONOABS 0.8 07/18/2024 2030   EOSABS 0.3 07/18/2024 2030   EOSABS 0.3 06/25/2024 1343   BASOSABS 0.1 07/18/2024 2030   BASOSABS 0.1 06/25/2024 1343    BMET    Component Value Date/Time   NA 136 07/23/2024 1108   NA 137 06/25/2024 1343   K 4.0 07/23/2024 1108   CL 98 07/23/2024 1108   CO2 28 07/23/2024 1108   GLUCOSE 224 (H) 07/23/2024 1108   BUN 9 07/23/2024 1108   BUN 18 06/25/2024 1343   CREATININE 0.62 07/23/2024 1108   CALCIUM 8.9 07/23/2024 1108   GFRNONAA >60 07/23/2024 1108   GFRAA >60 12/02/2019 0912    BNP    Component Value Date/Time   BNP 48.7 06/30/2019 0619    ProBNP    Component Value Date/Time   PROBNP 190.0 07/18/2024 2030   PROBNP 42 07/31/2021 1120    Imaging: CT ABDOMEN PELVIS WO CONTRAST Result Date: 07/22/2024 CLINICAL DATA:  Weight loss and nausea EXAM: CT ABDOMEN AND PELVIS WITHOUT CONTRAST TECHNIQUE: Multidetector CT imaging of the abdomen and pelvis was performed following the standard  protocol without IV contrast. RADIATION DOSE REDUCTION: This exam was performed according to the departmental dose-optimization program which includes automated exposure control, adjustment of the mA and/or kV according to patient size and/or use of iterative reconstruction technique. COMPARISON:  CT chest dated 07/19/2024, CT abdomen and pelvis dated 12/14/2012 FINDINGS: Lower chest: Increased right lower lobe tree-in-bud nodules. Partially  imaged right lower lobe subsegmental mucous plugging. Left lower lobe linear atelectasis/scarring. Increased small right pleural effusion. Partially imaged heart size is normal. Hepatobiliary: No focal hepatic lesions. No intra or extrahepatic biliary ductal dilation. Normal gallbladder. Pancreas: No focal lesions or main ductal dilation. Spleen: Normal in size.  Scattered calcified splenic granulomata. Adrenals/Urinary Tract: No adrenal nodules. Duplex left kidney. No suspicious renal masses by noncontrast technique. No hydronephrosis. Bilateral nonobstructing stones measuring up to 4 mm in the left upper lobe. No focal bladder wall thickening. Stomach/Bowel: Normal appearance of the stomach. No evidence of bowel wall thickening, distention, or inflammatory changes. Moderate volume stool throughout the colon. Colonic diverticulosis without acute diverticulitis. Appendix is not discretely seen. Vascular/Lymphatic: Aortic atherosclerosis. No enlarged abdominal or pelvic lymph nodes. Reproductive: Prostate is unremarkable. Other: No free fluid, fluid collection, or free air. Musculoskeletal: No acute or abnormal lytic or blastic osseous lesions. Multilevel degenerative changes of the partially imaged thoracic and lumbar spine. Subcutaneous emphysema and soft tissue stranding in the left anterolateral lower abdominal wall, likely injection related. IMPRESSION: 1. No acute abdominopelvic abnormality. 2. Increased right lower lobe tree-in-bud nodules with partially imaged right lower lobe subsegmental mucous plugging, likely infectious/inflammatory. 3. Increased small right pleural effusion. 4. Findings of constipation with moderate volume stool throughout the colon. 5. Bilateral nonobstructing renal stones measuring up to 4 mm in the left upper lobe. 6. Colonic diverticulosis without acute diverticulitis. 7.  Aortic Atherosclerosis (ICD10-I70.0). Electronically Signed   By: Limin  Xu M.D.   On: 07/22/2024 12:52   CT  CHEST WO CONTRAST Result Date: 07/19/2024 EXAM: CT CHEST WITHOUT CONTRAST 07/19/2024 09:55:19 AM TECHNIQUE: CT of the chest was performed without the administration of intravenous contrast. Multiplanar reformatted images are provided for review. Automated exposure control, iterative reconstruction, and/or weight based adjustment of the mA/kV was utilized to reduce the radiation dose to as low as reasonably achievable. COMPARISON: None available. CLINICAL HISTORY: Pneumonia, complication suspected, xray done; recurrent RUL pneumonia. FINDINGS: MEDIASTINUM: Heart: At least 3-vessel coronary artery calcifications. Aortic valve leaflet calcification. Pericardium is unremarkable. The central airways demonstrate mucous plugging noted within the distal bronchus. Mild-to-moderate atherosclerotic plaque. LYMPH NODES: Enlarged mediastinal lymph nodes, including a 1.3 cm precarinal lymph node. No gross hilar adenopathy with markedly limited evaluation on this noncontrast study and associated adjacent lung disease. No axillary lymphadenopathy. LUNGS AND PLEURA: Right upper lobe patchy consolidative airspace opacities. Upper and lower lobe mucous plugging. No pulmonary edema. No pleural effusion or pneumothorax. SOFT TISSUES/BONES: Old healed bilateral posterior rib fractures. No acute abnormality of the bones or soft tissues. UPPER ABDOMEN: Limited images of the upper abdomen demonstrate a tiny hiatal hernia. Bilateral nephrolithiasis with a 7 mm left stone and punctate right stones noted. IMPRESSION: 1. Right upper lobe patchy consolidative airspace opacities, most consistent with pneumonia. Upper and lower lobe mucous plugging, including within the distal right main bronchus. Recommend follow-up CT in 3 months to evaluate for complete resolution. 2. Enlarged mediastinal lymph nodes, including a 1.3 cm precarinal lymph node. No gross hilar adenopathy with limited evaluation on this noncontrast study. 3. Nonobstructive  bilateral nephrolithiasis. Electronically signed by: Morgane Naveau MD  07/19/2024 11:15 AM EST RP Workstation: [id number]   DG Chest Port 1 View Result Date: 07/18/2024 EXAM: 1 VIEW(S) XRAY OF THE CHEST 07/18/2024 08:03:00 PM COMPARISON: 06/25/2024 CLINICAL HISTORY: Cough, Dyspnea, recent pneumonia. FINDINGS: LUNGS AND PLEURA: Progression of right upper lobe airspace opacity consistent with worsening pneumonia. Follow up to resolution recommended. No pleural effusion. No pneumothorax. HEART AND MEDIASTINUM: No acute abnormality of the cardiac and mediastinal silhouettes. BONES AND SOFT TISSUES: No acute osseous abnormality. IMPRESSION: 1. Worsening right upper lobe pneumonia. Follow-up to resolution recommended. Electronically signed by: Vanetta Chou MD 07/18/2024 08:41 PM EST RP Workstation: [id number]    Administration History     None          Latest Ref Rng & Units 06/25/2024   10:59 AM 09/18/2018   10:21 AM 05/22/2015   10:01 AM  PFT Results  FVC-Pre L 2.00  3.17  3.41   FVC-Predicted Pre % 46  70  74   FVC-Post L 2.23  3.35  3.28   FVC-Predicted Post % 51  74  71   Pre FEV1/FVC % % 63  75  74   Post FEV1/FCV % % 64  76  77   FEV1-Pre L 1.25  2.39  2.52   FEV1-Predicted Pre % 39  70  72   FEV1-Post L 1.42  2.56  2.53   DLCO uncorrected ml/min/mmHg 13.26   22.74   DLCO UNC% % 52   73   DLVA Predicted % 85   97   TLC L 5.53   5.50   TLC % Predicted % 81   80   RV % Predicted % 147   113     Lab Results  Component Value Date   NITRICOXIDE 11 01/26/2017     Assessment & Plan:   Assessment & Plan Recurrent pneumonia  Chronic respiratory failure with hypoxia (HCC)  Asthma, persistent controlled  Assessment and Plan Assessment & Plan Recurrent pneumonia of the right upper lobe due to Pseudomonas Recurrent pneumonia in the right upper lobe with Pseudomonas, previously pan-sensitive to antibiotics. Previous cultures showed sensitive Pseudomonas that did not  completely resolve with appropriate antibiotics, suggesting possible alternative pathogen or obstruction. Bronchoscopy considered for diagnostic and therapeutic purposes to remove mucus plugs,obtain cultures and biopsy any suspicious lesions. - Continue Levaquin  750 mg once daily for 14 days. - Discontinued albuterol  and saline nebulizer treatments due to adverse effects. - Use Flutter device twice daily to aid mucus clearance. - Increase fluid intake to thin secretions. - Scheduled bronchoscopy for diagnostic and therapeutic purposes.  Chronic respiratory failure with hypoxemia Chronic respiratory failure with hypoxemia, managed with supplemental oxygen . Oxygen  saturation remains in the 94-96% range without supplemental oxygen . Symptoms of weakness and dizziness not attributed to hypoxemia. - Continue supplemental oxygen  as needed.  Unintentional weight loss and poor appetite Unintentional weight loss and poor appetite, possibly related to recurrent pneumonia and medication side effects. Differential includes pneumonia-related symptoms,medication effects, or postobstructive lesion. No night sweats reported, reducing likelihood of tuberculosis.  Refused quantiferon gold testing.  No risk factors for TB identified. - Increase fluid intake to maintain hydration. - Monitor weight and appetite.    Return for after bronchoscopy.  Candis Dandy, PA-C 08/14/2024      [1]  Allergies Allergen Reactions   Augmentin  [Amoxicillin -Pot Clavulanate] Nausea And Vomiting   Ivp Dye [Iodinated Contrast Media] Other (See Comments)    Dizziness/sweating ALSO   Lexapro [Escitalopram Oxalate] Itching  [2]  Social History Tobacco  Use  Smoking Status Former   Current packs/[place]: 0.00   Average packs/Done: 1 pack/[place] for 40.0 years (40.0 ttl pk-yrs)   Types: Cigarettes   Start date: 08/20/1979   Quit date: 08/20/2019   Years since quitting: 4.9   Passive exposure: Past  Smokeless Tobacco Never  Tobacco  Comments   Successfully quit smoking 08/20/2019   "

## 2024-08-14 NOTE — Patient Instructions (Addendum)
 Stop scheduled Albuterol  and saline nebs.  Continue scheduled flutter device and Mucinex  twice daily for mucus clearance.  Complete bronchoscopy; plan for follow up one week after this is completed.  Continue taking Levaquin ; finish all medication.

## 2024-08-15 ENCOUNTER — Emergency Department (HOSPITAL_COMMUNITY)

## 2024-08-15 ENCOUNTER — Other Ambulatory Visit: Payer: Self-pay

## 2024-08-15 ENCOUNTER — Encounter (HOSPITAL_COMMUNITY): Payer: Self-pay | Admitting: *Deleted

## 2024-08-15 ENCOUNTER — Ambulatory Visit (HOSPITAL_BASED_OUTPATIENT_CLINIC_OR_DEPARTMENT_OTHER): Payer: Self-pay | Admitting: Pulmonary Disease

## 2024-08-15 ENCOUNTER — Inpatient Hospital Stay (HOSPITAL_COMMUNITY)
Admission: EM | Admit: 2024-08-15 | Discharge: 2024-08-21 | DRG: 166 | Disposition: A | Attending: Family Medicine | Admitting: Family Medicine

## 2024-08-15 DIAGNOSIS — J45901 Unspecified asthma with (acute) exacerbation: Secondary | ICD-10-CM | POA: Diagnosis present

## 2024-08-15 DIAGNOSIS — Z881 Allergy status to other antibiotic agents status: Secondary | ICD-10-CM

## 2024-08-15 DIAGNOSIS — Z8701 Personal history of pneumonia (recurrent): Secondary | ICD-10-CM

## 2024-08-15 DIAGNOSIS — J189 Pneumonia, unspecified organism: Principal | ICD-10-CM | POA: Diagnosis present

## 2024-08-15 DIAGNOSIS — Z9842 Cataract extraction status, left eye: Secondary | ICD-10-CM

## 2024-08-15 DIAGNOSIS — Z79899 Other long term (current) drug therapy: Secondary | ICD-10-CM

## 2024-08-15 DIAGNOSIS — Z9841 Cataract extraction status, right eye: Secondary | ICD-10-CM

## 2024-08-15 DIAGNOSIS — J9621 Acute and chronic respiratory failure with hypoxia: Secondary | ICD-10-CM | POA: Diagnosis not present

## 2024-08-15 DIAGNOSIS — J441 Chronic obstructive pulmonary disease with (acute) exacerbation: Secondary | ICD-10-CM | POA: Diagnosis present

## 2024-08-15 DIAGNOSIS — Z8249 Family history of ischemic heart disease and other diseases of the circulatory system: Secondary | ICD-10-CM

## 2024-08-15 DIAGNOSIS — A419 Sepsis, unspecified organism: Secondary | ICD-10-CM

## 2024-08-15 DIAGNOSIS — I2510 Atherosclerotic heart disease of native coronary artery without angina pectoris: Secondary | ICD-10-CM | POA: Diagnosis present

## 2024-08-15 DIAGNOSIS — Z794 Long term (current) use of insulin: Secondary | ICD-10-CM

## 2024-08-15 DIAGNOSIS — N400 Enlarged prostate without lower urinary tract symptoms: Secondary | ICD-10-CM | POA: Diagnosis present

## 2024-08-15 DIAGNOSIS — Z823 Family history of stroke: Secondary | ICD-10-CM

## 2024-08-15 DIAGNOSIS — Z833 Family history of diabetes mellitus: Secondary | ICD-10-CM

## 2024-08-15 DIAGNOSIS — E109 Type 1 diabetes mellitus without complications: Secondary | ICD-10-CM | POA: Diagnosis present

## 2024-08-15 DIAGNOSIS — J440 Chronic obstructive pulmonary disease with acute lower respiratory infection: Secondary | ICD-10-CM | POA: Diagnosis present

## 2024-08-15 DIAGNOSIS — E10319 Type 1 diabetes mellitus with unspecified diabetic retinopathy without macular edema: Secondary | ICD-10-CM | POA: Diagnosis present

## 2024-08-15 DIAGNOSIS — F419 Anxiety disorder, unspecified: Secondary | ICD-10-CM | POA: Diagnosis present

## 2024-08-15 DIAGNOSIS — N529 Male erectile dysfunction, unspecified: Secondary | ICD-10-CM | POA: Diagnosis present

## 2024-08-15 DIAGNOSIS — E1065 Type 1 diabetes mellitus with hyperglycemia: Secondary | ICD-10-CM | POA: Diagnosis not present

## 2024-08-15 DIAGNOSIS — Z961 Presence of intraocular lens: Secondary | ICD-10-CM | POA: Diagnosis present

## 2024-08-15 DIAGNOSIS — Z7982 Long term (current) use of aspirin: Secondary | ICD-10-CM

## 2024-08-15 DIAGNOSIS — Z888 Allergy status to other drugs, medicaments and biological substances status: Secondary | ICD-10-CM

## 2024-08-15 DIAGNOSIS — Z79890 Hormone replacement therapy (postmenopausal): Secondary | ICD-10-CM

## 2024-08-15 DIAGNOSIS — Z1152 Encounter for screening for COVID-19: Secondary | ICD-10-CM

## 2024-08-15 DIAGNOSIS — T380X5A Adverse effect of glucocorticoids and synthetic analogues, initial encounter: Secondary | ICD-10-CM | POA: Diagnosis present

## 2024-08-15 DIAGNOSIS — E039 Hypothyroidism, unspecified: Secondary | ICD-10-CM | POA: Diagnosis present

## 2024-08-15 DIAGNOSIS — E785 Hyperlipidemia, unspecified: Secondary | ICD-10-CM | POA: Diagnosis present

## 2024-08-15 DIAGNOSIS — Z87891 Personal history of nicotine dependence: Secondary | ICD-10-CM

## 2024-08-15 DIAGNOSIS — Z7951 Long term (current) use of inhaled steroids: Secondary | ICD-10-CM

## 2024-08-15 DIAGNOSIS — T17508A Unspecified foreign body in bronchus causing other injury, initial encounter: Secondary | ICD-10-CM

## 2024-08-15 DIAGNOSIS — Z91041 Radiographic dye allergy status: Secondary | ICD-10-CM

## 2024-08-15 DIAGNOSIS — F32A Depression, unspecified: Secondary | ICD-10-CM | POA: Diagnosis present

## 2024-08-15 LAB — BASIC METABOLIC PANEL WITH GFR
Anion gap: 13 (ref 5–15)
BUN: 14 mg/dL (ref 8–23)
CO2: 23 mmol/L (ref 22–32)
Calcium: 9.7 mg/dL (ref 8.9–10.3)
Chloride: 100 mmol/L (ref 98–111)
Creatinine, Ser: 0.74 mg/dL (ref 0.61–1.24)
GFR, Estimated: >60 mL/min
Glucose, Bld: 189 mg/dL — ABNORMAL HIGH (ref 70–99)
Potassium: 4 mmol/L (ref 3.5–5.1)
Sodium: 137 mmol/L (ref 135–145)

## 2024-08-15 LAB — CBC
HCT: 33.9 % — ABNORMAL LOW (ref 39.0–52.0)
Hemoglobin: 10.7 g/dL — ABNORMAL LOW (ref 13.0–17.0)
MCH: 29.4 pg (ref 26.0–34.0)
MCHC: 31.6 g/dL (ref 30.0–36.0)
MCV: 93.1 fL (ref 80.0–100.0)
Platelets: 422 10*3/uL — ABNORMAL HIGH (ref 150–400)
RBC: 3.64 MIL/uL — ABNORMAL LOW (ref 4.22–5.81)
RDW: 13.2 % (ref 11.5–15.5)
WBC: 11.3 10*3/uL — ABNORMAL HIGH (ref 4.0–10.5)
nRBC: 0 % (ref 0.0–0.2)

## 2024-08-15 LAB — RESP PANEL BY RT-PCR (RSV, FLU A&B, COVID)  RVPGX2
Influenza A by PCR: NEGATIVE
Influenza B by PCR: NEGATIVE
Resp Syncytial Virus by PCR: NEGATIVE
SARS Coronavirus 2 by RT PCR: NEGATIVE

## 2024-08-15 LAB — I-STAT CG4 LACTIC ACID, ED: Lactic Acid, Venous: 1.8 mmol/L (ref 0.5–1.9)

## 2024-08-15 LAB — TROPONIN T, HIGH SENSITIVITY
Troponin T High Sensitivity: 7 ng/L (ref 0–19)
Troponin T High Sensitivity: 8 ng/L (ref 0–19)

## 2024-08-15 LAB — CBG MONITORING, ED: Glucose-Capillary: 271 mg/dL — ABNORMAL HIGH (ref 70–99)

## 2024-08-15 MED ORDER — ASPIRIN 81 MG PO TBEC: 81.0000 mg | DELAYED_RELEASE_TABLET | Freq: Every day | ORAL | Status: DC

## 2024-08-15 MED ORDER — ENOXAPARIN SODIUM 40 MG/0.4ML IJ SOSY
40.0000 mg | PREFILLED_SYRINGE | INTRAMUSCULAR | Status: DC
  Administered 2024-08-16: 40 mg via SUBCUTANEOUS
  Filled 2024-08-15: qty 0.4

## 2024-08-15 MED ORDER — INSULIN GLARGINE-YFGN 100 UNIT/ML ~~LOC~~ SOLN
30.0000 [IU] | Freq: Every day | SUBCUTANEOUS | Status: DC
  Administered 2024-08-16: 30 [IU] via SUBCUTANEOUS
  Filled 2024-08-15 (×2): qty 0.3

## 2024-08-15 MED ORDER — SENNOSIDES-DOCUSATE SODIUM 8.6-50 MG PO TABS
3.0000 | ORAL_TABLET | Freq: Every day | ORAL | Status: DC
  Administered 2024-08-15 – 2024-08-18 (×3): 3 via ORAL
  Filled 2024-08-15 (×4): qty 3

## 2024-08-15 MED ORDER — SALINE SPRAY 0.65 % NA SOLN: 1.0000 | NASAL | Status: DC | PRN

## 2024-08-15 MED ORDER — FINASTERIDE 5 MG PO TABS
5.0000 mg | ORAL_TABLET | Freq: Every day | ORAL | Status: DC
  Administered 2024-08-15 – 2024-08-20 (×6): 5 mg via ORAL
  Filled 2024-08-15 (×5): qty 1

## 2024-08-15 MED ORDER — POLYETHYLENE GLYCOL 3350 17 G PO PACK
17.0000 g | PACK | Freq: Two times a day (BID) | ORAL | Status: DC
  Administered 2024-08-15 – 2024-08-21 (×4): 17 g via ORAL
  Filled 2024-08-15 (×9): qty 1

## 2024-08-15 MED ORDER — SODIUM CHLORIDE 0.9 % IV BOLUS
1000.0000 mL | Freq: Once | INTRAVENOUS | Status: AC
  Administered 2024-08-15: 1000 mL via INTRAVENOUS

## 2024-08-15 MED ORDER — SODIUM CHLORIDE 0.9 % IV SOLN
2.0000 g | Freq: Once | INTRAVENOUS | Status: AC
  Administered 2024-08-15: 2 g via INTRAVENOUS
  Filled 2024-08-15: qty 12.5

## 2024-08-15 MED ORDER — ONDANSETRON HCL 4 MG PO TABS
4.0000 mg | ORAL_TABLET | Freq: Four times a day (QID) | ORAL | Status: DC | PRN
  Administered 2024-08-16 – 2024-08-21 (×8): 4 mg via ORAL
  Filled 2024-08-15 (×8): qty 1

## 2024-08-15 MED ORDER — DIPHENHYDRAMINE HCL 50 MG/ML IJ SOLN: 50.0000 mg | Freq: Once | INTRAMUSCULAR | Status: AC

## 2024-08-15 MED ORDER — SODIUM CHLORIDE 0.9 % IV SOLN
3.0000 g | Freq: Four times a day (QID) | INTRAVENOUS | Status: DC
  Administered 2024-08-15 – 2024-08-16 (×3): 3 g via INTRAVENOUS
  Filled 2024-08-15 (×3): qty 8

## 2024-08-15 MED ORDER — METOCLOPRAMIDE HCL 5 MG PO TABS
5.0000 mg | ORAL_TABLET | Freq: Two times a day (BID) | ORAL | Status: DC
  Administered 2024-08-16 – 2024-08-21 (×10): 5 mg via ORAL
  Filled 2024-08-15 (×10): qty 1

## 2024-08-15 MED ORDER — INSULIN GLARGINE-YFGN 100 UNIT/ML ~~LOC~~ SOLN
30.0000 [IU] | Freq: Every day | SUBCUTANEOUS | Status: DC
  Filled 2024-08-15: qty 0.3

## 2024-08-15 MED ORDER — DIPHENHYDRAMINE HCL 50 MG/ML IJ SOLN
50.0000 mg | Freq: Once | INTRAMUSCULAR | Status: AC
  Administered 2024-08-15: 50 mg via INTRAVENOUS
  Filled 2024-08-15: qty 1

## 2024-08-15 MED ORDER — INSULIN ASPART 100 UNIT/ML IJ SOLN
0.0000 [IU] | Freq: Every day | INTRAMUSCULAR | Status: DC
  Administered 2024-08-15: 5 [IU] via SUBCUTANEOUS
  Administered 2024-08-17: 4 [IU] via SUBCUTANEOUS
  Administered 2024-08-19: 2 [IU] via SUBCUTANEOUS
  Filled 2024-08-15: qty 4
  Filled 2024-08-15: qty 2
  Filled 2024-08-15: qty 5

## 2024-08-15 MED ORDER — GUAIFENESIN-DM 100-10 MG/5ML PO SYRP
5.0000 mL | ORAL_SOLUTION | ORAL | Status: DC | PRN
  Administered 2024-08-16 (×2): 5 mL via ORAL
  Filled 2024-08-15: qty 10
  Filled 2024-08-15: qty 5

## 2024-08-15 MED ORDER — SIMVASTATIN 20 MG PO TABS
40.0000 mg | ORAL_TABLET | Freq: Every day | ORAL | Status: DC
  Administered 2024-08-15 – 2024-08-20 (×6): 40 mg via ORAL
  Filled 2024-08-15 (×6): qty 2

## 2024-08-15 MED ORDER — LORATADINE 10 MG PO TABS
10.0000 mg | ORAL_TABLET | Freq: Every day | ORAL | Status: DC
  Administered 2024-08-16 – 2024-08-21 (×5): 10 mg via ORAL
  Filled 2024-08-15 (×5): qty 1

## 2024-08-15 MED ORDER — VANCOMYCIN HCL 750 MG/150ML IV SOLN
750.0000 mg | Freq: Two times a day (BID) | INTRAVENOUS | Status: DC
  Administered 2024-08-16: 750 mg via INTRAVENOUS
  Filled 2024-08-15 (×2): qty 150

## 2024-08-15 MED ORDER — BUDESONIDE 0.25 MG/2ML IN SUSP
0.2500 mg | Freq: Two times a day (BID) | RESPIRATORY_TRACT | Status: DC
  Administered 2024-08-15 – 2024-08-21 (×12): 0.25 mg via RESPIRATORY_TRACT
  Filled 2024-08-15 (×12): qty 2

## 2024-08-15 MED ORDER — LEVOTHYROXINE SODIUM 100 MCG PO TABS
100.0000 ug | ORAL_TABLET | Freq: Every day | ORAL | Status: DC
  Administered 2024-08-15 – 2024-08-20 (×6): 100 ug via ORAL
  Filled 2024-08-15 (×6): qty 1

## 2024-08-15 MED ORDER — DIPHENHYDRAMINE HCL 25 MG PO CAPS
50.0000 mg | ORAL_CAPSULE | Freq: Once | ORAL | Status: AC
  Administered 2024-08-15: 50 mg via ORAL
  Filled 2024-08-15: qty 2

## 2024-08-15 MED ORDER — HYDROCODONE-ACETAMINOPHEN 5-325 MG PO TABS
1.0000 | ORAL_TABLET | Freq: Once | ORAL | Status: AC
  Administered 2024-08-15: 1 via ORAL
  Filled 2024-08-15: qty 1

## 2024-08-15 MED ORDER — TAMSULOSIN HCL 0.4 MG PO CAPS
0.4000 mg | ORAL_CAPSULE | Freq: Every day | ORAL | Status: DC
  Administered 2024-08-15 – 2024-08-20 (×6): 0.4 mg via ORAL
  Filled 2024-08-15 (×6): qty 1

## 2024-08-15 MED ORDER — BISACODYL 10 MG RE SUPP: 10.0000 mg | Freq: Every day | RECTAL | Status: DC | PRN

## 2024-08-15 MED ORDER — SODIUM CHLORIDE 3 % IN NEBU: 4.0000 mL | INHALATION_SOLUTION | RESPIRATORY_TRACT | Status: AC | PRN

## 2024-08-15 MED ORDER — SODIUM CHLORIDE 0.9 % IV SOLN: 250.0000 mL | INTRAVENOUS | Status: AC | PRN

## 2024-08-15 MED ORDER — ACETAMINOPHEN 325 MG PO TABS: 650.0000 mg | ORAL_TABLET | Freq: Four times a day (QID) | ORAL | Status: DC | PRN

## 2024-08-15 MED ORDER — ACETAMINOPHEN 500 MG PO TABS: 1000.0000 mg | ORAL_TABLET | Freq: Four times a day (QID) | ORAL | Status: DC | PRN

## 2024-08-15 MED ORDER — DIPHENHYDRAMINE HCL 25 MG PO CAPS: 50.0000 mg | ORAL_CAPSULE | Freq: Once | ORAL | Status: AC

## 2024-08-15 MED ORDER — VANCOMYCIN HCL IN DEXTROSE 1-5 GM/200ML-% IV SOLN
1000.0000 mg | Freq: Once | INTRAVENOUS | Status: AC
  Administered 2024-08-15: 1000 mg via INTRAVENOUS
  Filled 2024-08-15: qty 200

## 2024-08-15 MED ORDER — IOHEXOL 350 MG/ML SOLN
75.0000 mL | Freq: Once | INTRAVENOUS | Status: AC | PRN
  Administered 2024-08-15: 75 mL via INTRAVENOUS

## 2024-08-15 MED ORDER — INSULIN ASPART 100 UNIT/ML IJ SOLN
0.0000 [IU] | Freq: Three times a day (TID) | INTRAMUSCULAR | Status: DC
  Administered 2024-08-16 (×2): 5 [IU] via SUBCUTANEOUS
  Administered 2024-08-17: 8 [IU] via SUBCUTANEOUS
  Administered 2024-08-18: 5 [IU] via SUBCUTANEOUS
  Administered 2024-08-18: 11 [IU] via SUBCUTANEOUS
  Administered 2024-08-18: 3 [IU] via SUBCUTANEOUS
  Administered 2024-08-19 (×3): 11 [IU] via SUBCUTANEOUS
  Administered 2024-08-20: 3 [IU] via SUBCUTANEOUS
  Administered 2024-08-20: 5 [IU] via SUBCUTANEOUS
  Administered 2024-08-20: 11 [IU] via SUBCUTANEOUS
  Administered 2024-08-21: 5 [IU] via SUBCUTANEOUS
  Filled 2024-08-15: qty 8
  Filled 2024-08-15: qty 5
  Filled 2024-08-15: qty 11
  Filled 2024-08-15: qty 5
  Filled 2024-08-15 (×2): qty 11
  Filled 2024-08-15: qty 3
  Filled 2024-08-15 (×2): qty 5
  Filled 2024-08-15: qty 11
  Filled 2024-08-15: qty 5
  Filled 2024-08-15: qty 11
  Filled 2024-08-15: qty 5

## 2024-08-15 MED ORDER — SODIUM CHLORIDE 0.9% FLUSH: 3.0000 mL | INTRAVENOUS | Status: DC | PRN

## 2024-08-15 MED ORDER — IPRATROPIUM-ALBUTEROL 0.5-2.5 (3) MG/3ML IN SOLN
3.0000 mL | Freq: Once | RESPIRATORY_TRACT | Status: AC
  Administered 2024-08-15: 3 mL via RESPIRATORY_TRACT
  Filled 2024-08-15: qty 3

## 2024-08-15 MED ORDER — ALBUTEROL SULFATE (2.5 MG/3ML) 0.083% IN NEBU: 3.0000 mL | INHALATION_SOLUTION | RESPIRATORY_TRACT | Status: DC | PRN

## 2024-08-15 MED ORDER — PANTOPRAZOLE SODIUM 40 MG PO TBEC
40.0000 mg | DELAYED_RELEASE_TABLET | Freq: Every day | ORAL | Status: DC
  Administered 2024-08-15 – 2024-08-20 (×6): 40 mg via ORAL
  Filled 2024-08-15 (×6): qty 1

## 2024-08-15 MED ORDER — HYDROCODONE-ACETAMINOPHEN 5-325 MG PO TABS
1.0000 | ORAL_TABLET | Freq: Three times a day (TID) | ORAL | Status: DC | PRN
  Administered 2024-08-16 – 2024-08-21 (×8): 1 via ORAL
  Filled 2024-08-15 (×8): qty 1

## 2024-08-15 MED ORDER — SODIUM CHLORIDE 0.9% FLUSH
3.0000 mL | Freq: Two times a day (BID) | INTRAVENOUS | Status: DC
  Administered 2024-08-15 – 2024-08-20 (×10): 3 mL via INTRAVENOUS

## 2024-08-15 MED ORDER — ONDANSETRON HCL 4 MG/2ML IJ SOLN
4.0000 mg | Freq: Once | INTRAMUSCULAR | Status: AC
  Administered 2024-08-15: 4 mg via INTRAVENOUS
  Filled 2024-08-15: qty 2

## 2024-08-15 MED ORDER — TRAZODONE HCL 50 MG PO TABS
150.0000 mg | ORAL_TABLET | Freq: Every evening | ORAL | Status: DC | PRN
  Administered 2024-08-16 – 2024-08-20 (×6): 150 mg via ORAL
  Filled 2024-08-15 (×2): qty 3
  Filled 2024-08-15: qty 2
  Filled 2024-08-15 (×3): qty 3

## 2024-08-15 MED ORDER — ACETAMINOPHEN 650 MG RE SUPP: 650.0000 mg | Freq: Four times a day (QID) | RECTAL | Status: DC | PRN

## 2024-08-15 MED ORDER — GUAIFENESIN ER 600 MG PO TB12: 1200.0000 mg | ORAL_TABLET | Freq: Two times a day (BID) | ORAL | Status: DC

## 2024-08-15 MED ORDER — METHYLPREDNISOLONE SODIUM SUCC 40 MG IJ SOLR
40.0000 mg | Freq: Once | INTRAMUSCULAR | Status: AC
  Administered 2024-08-15: 40 mg via INTRAVENOUS
  Filled 2024-08-15: qty 1

## 2024-08-15 NOTE — Hospital Course (Signed)
 Patient is a 69 years old male with past medical history of anxiety, asthma, COPD depression, diabetes mellitus, hyperlipidemia, hypothyroidism, seizures presented to hospital with shortness of breath.  Of note patient was recently hospitalized in September 2025 for pneumonia and was on home oxygen  since then.  He was recently discharged from the hospital on 07/27/2024.  Due to increasing shortness of breath he had gone to his primary care provider when he was noted to have persistent pneumonia in the right upper lobe and was started on Levaquin  since last Friday.  Patient however did not improve and had ongoing dyspnea on exertion and required up to 3 L from baseline 2 L with productive cough and chest pain while coughing.  He also complained of back pain due to excessive cough.  Patient denies any nausea vomiting fever chills or rigor.  Denies any urinary urgency, frequency or dysuria.  Denies any dizziness lightheadedness or syncope.  In the ED patient was noted to be slightly tachycardic and tachypneic.  Had diffuse wheezing.  Initial labs were noted for WBC at 11.3.  Lactate of 1.8.  Influenza COVID and RSV was negative.  CT angiogram of the chest showed no evidence of pulm embolism but complete occlusion of the right upper lobe bronchus with significant progression of right upper lobe consolidation since prior CT scan finding suggestive of possible postobstructive pneumonia or malignancy.  Bronchoscopy was advised.  In the ED patient received Benadryl , DuoNebs, vancomycin  and cefepime , Benadryl , Solu-Medrol  40 mg x 1 and was considered for admission to the hospital for further evaluation and treatment.   Progressive shortness of breath and dyspnea.  No obvious respiratory distress.  On 3 nasal cannula oxygen .  Possibility of postobstructive pneumonia.  CT scan this time with progression of disease and has been having recurrent pneumonia.  Will get pulmonary evaluation.  Of note patient had plans for  outpatient bronchoscopy with EBUS with Dr. Shelah on 09/04/2024.  Continue vancomycin  and cefepime , add Flagyl..  Incentive spirometry chest therapy bronchodilators.  History of asthma COPD continue bronchodilators and steroids. On oxygen  at home.   Diabetes mellitus type 2.   We will put the patient on sliding scale insulin , Accu-Cheks, diabetic diet.  We will closely monitor.  Hold oral hypoglycemic now. Last known hemoglobin A1c of . Will obtain A1c  Hyperlipidemia. Continue Statins.   Hypothyroidism.  Continue Synthroid .

## 2024-08-15 NOTE — ED Triage Notes (Signed)
 Pt is here for evaluation of sob which has been ongoing since September but has been getting worse for the past 48 hours...SABRASABRA pt was hospitalized x2 since September for pneumonia and is on home o2 since after first hospitalization.  Pt has a productive cough.

## 2024-08-15 NOTE — ED Provider Notes (Signed)
 " [place] EMERGENCY DEPARTMENT AT [facility] Provider Note   CSN: [id number] Arrival date & time: 08/15/24  1348     Patient presents with: Shortness of Breath   James [name] is a 69 y.o. male.   69 year old male presenting with increased shortness of breath. Patient has had multiple hospitalizations for pneumonia since September, most recently was discharged from the hospital 1/9.  Recently restarted course of Levaquin  on Friday/Saturday, as he had been seen at his primary care provider office and there was a concern that his chest x-ray showed persistent pneumonia in the right upper lobe. Notes increasing shortness of breath at rest and with exertion and is now requiring 3 L of oxygen  at home, where he was previously on 2 L.  Notes mildly productive cough, chest pain associated with coughing, exacerbation of chronic back pain which he believes is secondary to recurrent coughing.  History of COPD. Endorses nausea, no vomiting or abdominal pain.    Shortness of Breath      Prior to Admission medications  Medication Sig Start Date End Date Taking? Authorizing Provider  acetaminophen  (TYLENOL ) 500 MG tablet Take 1,000 mg by mouth every 6 (six) hours as needed for mild pain or headache.    [provider]  albuterol  (PROVENTIL ) (2.5 MG/3ML) 0.083% nebulizer solution Take 3 mLs by nebulization every 6 (six) hours as needed. 08/07/24   Charley Conger, PA-C  albuterol  (VENTOLIN  HFA) [phone] Base) MCG/ACT inhaler Inhale 2 puffs into the lungs every 6 (six) hours as needed for wheezing or shortness of breath. 07/03/24   Jude Harden GAILS, MD  aspirin  EC 81 MG tablet Take 1 tablet (81 mg total) by mouth daily. Swallow whole. Patient taking differently: Take 81 mg by mouth at bedtime. Swallow whole. 04/16/20   Tolia, Sunit, DO  bisacodyl  (DULCOLAX) 10 MG suppository Place 1 suppository (10 mg total) rectally daily as needed for moderate constipation. 07/24/24   Will Almarie MATSU, MD   cetirizine (ZYRTEC) 10 MG tablet Take 10 mg by mouth at bedtime.    [provider]  Continuous Blood Gluc Sensor (FREESTYLE LIBRE 3 SENSOR) MISC Change sensor every 14 days to monitor blood glucose continously 04/08/22     Continuous Glucose Sensor (FREESTYLE LIBRE 3 PLUS SENSOR) MISC Inject 1 Device into the skin See admin instructions. Place a new sensor into the skin every 15 days    [provider]  finasteride  (PROSCAR ) 5 MG tablet Take 1 tablet (5 mg total) by mouth daily. Patient taking differently: Take 5 mg by mouth at bedtime. 03/23/24     fluticasone -salmeterol (ADVAIR ) 250-50 MCG/ACT AEPB Inhale 1 puff into the lungs every 12 (twelve) hours. 05/03/24   Jude Harden GAILS, MD  Glucagon  (GVOKE HYPOPEN  2-PACK) 1 MG/0.2ML SOAJ Inject under the skin as directed for severe hypoglycemia. 06/17/22     Glucagon  (GVOKE HYPOPEN  2-PACK) 1 MG/0.2ML SOAJ Use as directed under the skin for severe hypoglycemia. 05/31/23     glucose blood (CONTOUR NEXT TEST) test strip USE 8 TIMES DAILY AS DIRECTED TO MONITOR BLOOD GLUCOSE 04/23/22     guaiFENesin  (MUCINEX ) 600 MG 12 hr tablet Take 2 tablets (1,200 mg total) by mouth 2 (two) times daily. 04/18/24   Ghimire, Kuber, MD  HYDROcodone -acetaminophen  (NORCO/VICODIN) 5-325 MG tablet Take 1 tablet by mouth 3 (three) times daily as needed. 08/02/24     insulin  aspart (NOVOLOG  FLEXPEN) 100 UNIT/ML FlexPen Inject up to 100 units subcutaneously per [place] as directed (ICR  20, ISF 20, target 120) 04/02/24     insulin  glargine (LANTUS  SOLOSTAR) 100 UNIT/ML Solostar Pen Inject 25-30 Units into the skin daily as directed Patient taking differently: Inject 27 Units into the skin daily before breakfast. 10/20/23     Insulin  Pen Needle (TECHLITE PEN NEEDLES) 31G X 8 MM MISC Use 1 pen needle 4-5 times daily as directed. 11/30/22     Insulin  Pen Needle 31G X 8 MM MISC Use 1 pen needle subcutaneously 4 - 5 times daily as directed. 09/07/21     Insulin  Syringe-Needle U-100  31G X 5/16 0.3 ML MISC use to inject insulin  6 to 8 times daily 12/07/23   Nichole Senior, MD  ipratropium-albuterol  (DUONEB) 0.5-2.5 (3) MG/3ML SOLN Inhale 3 mLs into the lungs every 6 (six) hours as needed with nebulizer. 04/24/24     levofloxacin  (LEVAQUIN ) 500 MG tablet Take 1 tablet (500 mg total) by mouth 2 (two) times daily. 08/10/24     levofloxacin  (LEVAQUIN ) 750 MG tablet Take 1 tablet (750 mg total) by mouth daily for 14 days. 08/08/24 08/24/24  Charley Conger, PA-C  levothyroxine  (SYNTHROID ) 100 MCG tablet Take 1 tablet (100 mcg total) by mouth daily. Patient taking differently: Take 100 mcg by mouth at bedtime. 03/22/24     metoCLOPramide  (REGLAN ) 5 MG tablet Take 1 tablet (5 mg total) by mouth 2 (two) times daily before a meal. 05/14/24     ondansetron  (ZOFRAN ) 4 MG tablet Take 1 tablet (4 mg total) by mouth every 6 (six) hours as needed for nausea. 07/24/24   Will Almarie MATSU, MD  pantoprazole  (PROTONIX ) 40 MG tablet Take 1 tablet by mouth daily as directed. Patient taking differently: Take 40 mg by mouth at bedtime. 02/21/24     polyethylene glycol powder (GLYCOLAX /MIRALAX ) 17 GM/SCOOP powder Take 17 g by mouth 2 (two) times daily. Dissolve 1 capful (17g) in 4-8 ounces of liquid and take by mouth two times daily. 07/24/24   Will Almarie MATSU, MD  promethazine  (PHENERGAN ) 25 MG tablet Take 1 tablet (25 mg total) by mouth 3 (three) times daily as needed. 06/23/24     senna-docusate (SENOKOT-S) 8.6-50 MG tablet Take 3 tablets by mouth at bedtime. 07/24/24   Will Almarie MATSU, MD  simvastatin  (ZOCOR ) 40 MG tablet Take 1 tablet (40 mg total) by mouth daily. Patient taking differently: Take 40 mg by mouth at bedtime. 07/27/23     sodium chloride  (OCEAN) 0.65 % SOLN nasal spray Place 1 spray into both nostrils as needed for congestion.    [provider]  sodium chloride  HYPERTONIC 3 % nebulizer solution Use in nebulizer as directed 08/07/24   Charley Conger, PA-C  tamsulosin  (FLOMAX ) 0.4 MG  CAPS capsule Take 1 capsule (0.4 mg total) by mouth daily. Patient taking differently: Take 0.4 mg by mouth at bedtime. 03/20/24     traZODone  (DESYREL ) 150 MG tablet Take 1 tablet (150 mg total) by mouth at bedtime as needed for sleep Patient taking differently: Take 150 mg by mouth See admin instructions. Take 150 mg by mouth at bedtime 02/19/24       Allergies: Augmentin  [amoxicillin -pot clavulanate], Ivp dye [iodinated contrast media], and Lexapro [escitalopram oxalate]    Review of Systems  Respiratory:  Positive for shortness of breath.     Updated Vital Signs  Vitals:   08/15/24 1357 08/15/24 1400  BP: 127/76   Pulse: 76 (!) 118  Resp: (!) 22 (!) 9  Temp: 98 F (36.7 C)   TempSrc:  Oral   SpO2: 100% 97%     Physical Exam Vitals and nursing note reviewed.  Constitutional:      Appearance: He is ill-appearing.  HENT:     Head: Normocephalic and atraumatic.     Mouth/Throat:     Mouth: Mucous membranes are moist.  Eyes:     Extraocular Movements: Extraocular movements intact.     Pupils: Pupils are equal, round, and reactive to light.  Cardiovascular:     Rate and Rhythm: Regular rhythm. Tachycardia present.  Pulmonary:     Comments: Mildly tachypneic, Rales throughout but more pronounced in right lung fields, mild diffuse expiratory wheeze Speaking in full/clear sentences, no retractions On 3L [place] Abdominal:     Palpations: Abdomen is soft.     Tenderness: There is no abdominal tenderness. There is no guarding.  Musculoskeletal:     Cervical back: Normal range of motion.     Right lower leg: No edema.     Left lower leg: No edema.     Comments: Moves all extremities spontaneously without difficulty  Skin:    General: Skin is warm and dry.  Neurological:     General: No focal deficit present.     Mental Status: He is alert and oriented to person, place, and time.     (all labs ordered are listed, but only abnormal results are displayed) Labs Reviewed  BASIC  METABOLIC PANEL WITH GFR - Abnormal; Notable for the following components:      Result Value   Glucose, Bld 189 (*)    All other components within normal limits  CBC - Abnormal; Notable for the following components:   WBC 11.3 (*)    RBC 3.64 (*)    Hemoglobin 10.7 (*)    HCT 33.9 (*)    Platelets 422 (*)    All other components within normal limits  CBG MONITORING, ED - Abnormal; Notable for the following components:   Glucose-Capillary 271 (*)    All other components within normal limits  RESP PANEL BY RT-PCR (RSV, FLU A&B, COVID)  RVPGX2  CULTURE, BLOOD (ROUTINE X 2)  CULTURE, BLOOD (ROUTINE X 2)  BASIC METABOLIC PANEL WITH GFR  CBC  MAGNESIUM   HEMOGLOBIN A1C  I-STAT CG4 LACTIC ACID, ED  TROPONIN T, HIGH SENSITIVITY  TROPONIN T, HIGH SENSITIVITY    EKG: None  Radiology: CT Angio Chest PE W and/or Wo Contrast Result Date: 08/15/2024 CLINICAL DATA:  Shortness of breath. EXAM: CT ANGIOGRAPHY CHEST WITH CONTRAST TECHNIQUE: Multidetector CT imaging of the chest was performed using the standard protocol during bolus administration of intravenous contrast. Multiplanar CT image reconstructions and MIPs were obtained to evaluate the vascular anatomy. RADIATION DOSE REDUCTION: This exam was performed according to the departmental dose-optimization program which includes automated exposure control, adjustment of the mA and/or kV according to patient size and/or use of iterative reconstruction technique. CONTRAST:  75mL OMNIPAQUE  IOHEXOL  350 MG/ML SOLN COMPARISON:  Chest CT dated 07/19/2024. FINDINGS: Cardiovascular: There is no cardiomegaly or pericardial effusion. There is coronary vascular calcification. Mild atherosclerotic calcification of the thoracic aorta. No aneurysmal dilatation or dissection. The origins of the great vessels of the aortic arch appear patent. No pulmonary artery embolus identified. Mediastinum/Nodes: Mildly enlarged lymph node anterior to the carina measures 12 mm  short axis. Evaluation of the right hilar lymph nodes is limited due to consolidative changes of the right lung. Mildly enlarged right the esophageal lymph node measures 10 mm short axis. Subcarinal lymph node measures  approximately 9 mm short axis. The esophagus is grossly unremarkable. No mediastinal fluid collection. Lungs/Pleura: Large area of consolidation in the right upper lobe significantly progressed prior radiograph. There is diffuse interlobular septal thickening in the right apex. There is occlusion of the right upper lobe bronchus as well as mucous plugging in the right middle and right lower lobe bronchi. Trace right pleural effusion suspected. No pneumothorax. Upper Abdomen: No acute abnormality. Musculoskeletal: No acute osseous pathology. Review of the MIP images confirms the above findings. IMPRESSION: 1. No CT evidence of pulmonary artery embolus. 2. 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 Findings may represent worsening postobstructive pneumonia or a malignancy. Bronchoscopy may provide better evaluation. 3. Mildly enlarged mediastinal lymph nodes. 4.  Aortic Atherosclerosis (ICD10-I70.0). Electronically Signed   By: Vanetta Chou M.D.   On: 08/15/2024 20:57   DG Chest 2 View Result Date: 08/15/2024 EXAM: 2 VIEW(S) XRAY OF THE CHEST 08/15/2024 02:50:00 PM COMPARISON: 07/18/2024 CLINICAL HISTORY: Cough, shortness of breath. FINDINGS: LUNGS AND PLEURA: Interval increasing right upper lobe airspace disease with near complete opacification. Differential diagnosis includes pneumonia, atelectasis, or possibly a mass. Clinical correlation and further imaging may be warranted. No pleural effusion. No pneumothorax. HEART AND MEDIASTINUM: No acute abnormality of the cardiac and mediastinal silhouettes. BONES AND SOFT TISSUES: Partially visualized lower cervical fusion hardware. IMPRESSION: 1. Interval  increase in right upper lobe airspace opacity with near complete opacification, most consistent with pneumonia; differential includes lobar atelectasis from mucus plugging and an obstructing endobronchial lesion/malignancy. 2. Recommend follow-up chest radiograph in 6-8 weeks to document resolution and exclude an underlying obstructing lesion; consider CT chest with IV contrast if findings fail to improve as expected. Electronically signed by: Waddell Calk MD 08/15/2024 03:20 PM EST RP Workstation: [id number]     .Critical Care  Performed by: Glendia Rocky SAILOR, PA-C Authorized by: Glendia Rocky SAILOR, PA-C   Critical care provider statement:    Critical care time (minutes):  60   Critical care time was exclusive of:  Separately billable procedures and treating other patients and teaching time   Critical care was necessary to treat or prevent imminent or life-threatening deterioration of the following conditions:  Sepsis   Critical care was time spent personally by me on the following activities:  Ordering and performing treatments and interventions, development of treatment plan with patient or surrogate, ordering and review of laboratory studies, ordering and review of radiographic studies, evaluation of patient's response to treatment, re-evaluation of patient's condition, examination of patient, review of old charts and obtaining history from patient or surrogate   I assumed direction of critical care for this patient from another provider in my specialty: no     Care discussed with: admitting provider      Medications Ordered in the ED  diphenhydrAMINE  (BENADRYL ) capsule 50 mg (has no administration in time range)    Or  diphenhydrAMINE  (BENADRYL ) injection 50 mg (has no administration in time range)  methylPREDNISolone  sodium succinate (SOLU-MEDROL ) 40 mg/mL injection 40 mg (40 mg Intravenous Given 08/15/24 1555)  ipratropium-albuterol  (DUONEB) 0.5-2.5 (3) MG/3ML nebulizer solution 3 mL (3 mLs  Nebulization Given 08/15/24 1556)                                    Medical Decision Making This patient presents to the ED for concern of shortness  of breath, this involves an extensive number of treatment options, and is a complaint that carries with it a high risk of complications and morbidity.  The differential diagnosis includes worsening of pneumonia, pulmonary embolism, malignancy, COPD exacerbation, ACS   Co morbidities that complicate the patient evaluation  Currently undergoing treatment for pneumonia, on Levaquin .  History of COPD   Additional history obtained:  Additional history obtained from record review External records from outside source obtained and reviewed including recent pulmonology note, prior hospital discharge summary   Lab Tests:  I Ordered, and personally interpreted labs.  The pertinent results include: CBC notable for leukocytosis of 11.3, hemoglobin of 10.7 is largely stable from previous.  BMP unremarkable.  Respiratory panel negative.  Lactic 1.8.  Troponin of 7, repeat 8.    Imaging Studies ordered:  I ordered imaging studies including CXR, CTA PE study  I independently visualized and interpreted imaging which showed  - CTA PE study: 1. No CT evidence of pulmonary artery embolus. 2. 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 Findings may represent worsening postobstructive pneumonia or a malignancy. Bronchoscopy may provide better evaluation. 3. Mildly enlarged mediastinal lymph nodes. 4.  Aortic Atherosclerosis (ICD10-I70.0). - CXR: 1. Interval increase in right upper lobe airspace opacity with near complete opacification, most consistent with pneumonia; differential includes lobar atelectasis from mucus plugging and an obstructing endobronchial lesion/malignancy. 2. Recommend follow-up chest radiograph in 6-8 weeks to document resolution and exclude an  underlying obstructing lesion; consider CT chest with IV contrast if findings fail to improve as expected.  I agree with the radiologist interpretation    Consultations Obtained:  I requested consultation with the hospitalist,  and discussed lab and imaging findings as well as pertinent plan - they recommend: I spoke with Dr. Sonjia who agrees that this patient is appropriate for admission, plans to consult pulmonology team as patient is scheduled for bronchoscopy in February   Problem List / ED Course / Critical interventions / Medication management   I ordered medication including DuoNeb x 2 for wheezing/shortness of breath, Norco for chronic back pain, Zofran  for nausea, IV fluid bolus for tachycardia, Benadryl  and Solu-Medrol  for IV contrast allergy premedication, vancomycin  and cefepime  for pneumonia Reevaluation of the patient after these medicines showed that the patient improved I have reviewed the patients home medicines and have made adjustments as needed   Social Determinants of Health:  Former tobacco use   Test / Admission - Considered:   Patient meets SIRS criteria based on initial respiratory rate as well as tachycardia, will obtain lactic/blood cultures. Physical exam notable as above, patient does have rales throughout but worse in the right upper lung fields, diffuse expiratory wheeze noted as well.  He is maintaining his oxygen  saturation on his home 3L via [place].  Patient with known pneumonia, on Levaquin  at home currently and this is his second course of this antibiotic without much symptomatic improvement.  Will reassess patient's lung exam after DuoNeb/steroids. Patient notes symptomatic improvement after administration of DuoNeb x 2 as well as steroids.  Concern for underlying pulmonary embolism, as patient is high risk and was tachycardic on arrival, will proceed with CTA PE study of the chest.  Patient has history of contrast dye allergy, will require  premedication with steroids/Benadryl . Broad-spectrum IV antibiotics initiated for suspected hospital-acquired pneumonia given that he was discharged on 1/9, patient also given IV fluid boluses in the setting of  presumed sepsis secondary to pneumonia.  Patient given home dose of Norco for chronic back pain. Patient tolerated IV contrast well, see above for results of CTA, no pulmonary embolism noted however patient does appear to have complete occlusion of the bronchus of the right upper lobe with consolidation, concern for worsening pneumonia versus malignancy.  Patient has been scheduled for bronchoscopy in February, however he may benefit from this sooner given the worsening nature of his symptoms.  I discussed this with the hospitalist service who is in agreement that he is appropriate for admission, will likely plan to consult pulmonology team during his admission.  Patient and his wife are aware that he is being admitted, they are in agreement with this plan.  Amount and/or Complexity of Data Reviewed Labs: ordered. Radiology: ordered.  Risk Prescription drug management. Decision regarding hospitalization.        Final diagnoses:  Pneumonia of right upper lobe due to infectious organism  COPD exacerbation (HCC)  Sepsis, due to unspecified organism, unspecified whether acute organ dysfunction present [facility])    ED Discharge Orders     None          Glendia Rocky LOISE DEVONNA 08/15/24 2254    Ula Prentice SAUNDERS, MD 08/18/24 1117  "

## 2024-08-15 NOTE — ED Provider Triage Note (Signed)
 Emergency Medicine Provider Triage Evaluation Note  James [name] , a 69 y.o. male  was evaluated in triage.  Pt complains of increased shortness of breath.  Patient has had multiple hospitalizations for pneumonia since September, recently restarted course of Levaquin  on Friday/Saturday, notes increasing shortness of breath at rest and with exertion and is now requiring 3 L of oxygen  at home, where he was previously on 2 L.  Notes mildly productive cough, chest pain associated with coughing, exacerbation of chronic back pain which he believes is secondary to recurrent coughing.  Review of Systems  Positive: As above Negative: As above  Physical Exam  BP 127/76 (BP Location: Left Arm)   Pulse (!) 118   Temp 98 F (36.7 C) (Oral)   Resp (!) 9   SpO2 97%  Gen:   Awake, no distress   Resp:  Rales throughout, mild diffuse expiratory wheeze MSK:   Moves extremities without difficulty  Other:    Medical Decision Making  Medically screening exam initiated at 3:20 PM.  Appropriate orders placed.  Rex Oesterle Flannery was informed that the remainder of the evaluation will be completed by another provider, this initial triage assessment does not replace that evaluation, and the importance of remaining in the ED until their evaluation is complete.     Glendia Rocky SAILOR, [place] 08/15/24 1521

## 2024-08-15 NOTE — ED Notes (Addendum)
 unable to obtain 2nd set of cultures at this time. Arm band on IV per pt wife request

## 2024-08-15 NOTE — Telephone Encounter (Signed)
 FYI

## 2024-08-15 NOTE — Progress Notes (Signed)
 Pharmacy Antibiotic Note  James [name] is a 69 y.o. male admitted on 08/15/2024 with recurrent pneumonia.  Recent hospitalization in September and early January for pneumonia.  Patient received Vancomycin  1g IV and Cefepime  2g IV x 1 dose each in the ED.  Pharmacy has been consulted for Vancomycin  and Unasyn  dosing.  Plan: Unasyn  3g IV q6h Vancomycin  750 mg IV Q 12 hrs. Goal AUC 400-550.  Expected AUC: 460  SCr used: 0.8 (rounded up from 0.74) Follow renal function F/u culture results & sensitivities     Temp (24hrs), Avg:98.4 F (36.9 C), Min:98 F (36.7 C), Max:98.9 F (37.2 C)  Recent Labs  Lab 08/15/24 1413 08/15/24 1557  WBC 11.3*  --   CREATININE 0.74  --   LATICACIDVEN  --  1.8    Estimated Creatinine Clearance: 82.2 mL/min (by C-G formula based on SCr of 0.74 mg/dL).    Allergies[1]  Antimicrobials this admission: 1/28 Cefepime  x 1 1/28 Vancomycin  >>   1/29 Unasyn  >>  Dose adjustments this admission:    Microbiology results: 1/28 BCx:      Thank you for allowing pharmacy to be a part of this patients care.  Arvin Gauss, PharmD 08/15/2024 9:57 PM     [1]  Allergies Allergen Reactions   Augmentin  [Amoxicillin -Pot Clavulanate] Nausea And Vomiting   Ivp Dye [Iodinated Contrast Media] Other (See Comments)    Dizziness/sweating ALSO   Lexapro [Escitalopram Oxalate] Itching

## 2024-08-15 NOTE — Telephone Encounter (Signed)
 *Pt has his bronchoscopy ordered at [facility], but they prefer James [name] for ED or admission. Will go to [facility] if provider prefers. Wife can be reached at [phone].   FYI Only or Action Required?: FYI only for provider: ED advised.  Patient is followed in Pulmonology for chronic resp failure, last seen on 08/14/2024 by Charley Conger, PA-C.  Called Nurse Triage reporting Breathing Problem.  Symptoms began chronic, worsened overnight.  Interventions attempted: Prescription medications: albuterol .  Symptoms are: gradually worsening.  Triage Disposition: Call EMS 911 Now  Patient/caregiver understands and will follow disposition?: No, refuses disposition - going by POV  E2C2 Pulmonary Triage - Initial Assessment Questions Chief Complaint (e.g., cough, sob, wheezing, fever, chills, sweat or additional symptoms) *Go to specific symptom protocol after initial questions. SOB at rest.   How long have symptoms been present? Was seen for same 08/14/24, but significantly worse overnight and today  Have you tested for COVID or Flu? Note: If not, ask patient if a home test can be taken. If so, instruct patient to call back for positive results. No  MEDICINES:   Have you used any OTC meds to help with symptoms? No If yes, ask What medications? NA  Have you used your inhalers/maintenance medication? Yes If yes, What medications? Albuterol   If inhaler, ask How many puffs and how often? Note: Review instructions on medication in the chart. Albuterol  per sig: Inhale 2 puffs into the lungs every 6 (six) hours as needed for wheezing or shortness of breath.   OXYGEN : Do you wear supplemental oxygen ? Yes If yes, How many liters are you supposed to use? 2-3 L  Do you monitor your oxygen  levels? Yes If yes, What is your reading (oxygen  level) today? 92-93%  What is your usual oxygen  saturation reading?  (Note: Pulmonary O2 sats should be 90% or greater) 95-97%  Message  from Joesph PARAS sent at 08/15/2024 11:41 AM EST  Reason for Triage: Was directed by provider at end of visit yesterday to go to hospital is feeling worse.   Reason for Disposition  SEVERE difficulty breathing (e.g., struggling for each breath, speaks in single words)  Answer Assessment - Initial Assessment Questions 1. RESPIRATORY STATUS: Describe your breathing? (e.g., wheezing, shortness of breath, unable to speak, severe coughing)      *No Answer* 2. ONSET: When did this breathing problem begin?      *No Answer* 3. PATTERN Does the difficult breathing come and go, or has it been constant since it started?      *No Answer* 4. SEVERITY: How bad is your breathing? (e.g., mild, moderate, severe)      *No Answer* 5. RECURRENT SYMPTOM: Have you had difficulty breathing before? If Yes, ask: When was the last time? and What happened that time?      *No Answer* 6. CARDIAC HISTORY: Do you have any history of heart disease? (e.g., heart attack, angina, bypass surgery, angioplasty)      *No Answer* 7. LUNG HISTORY: Do you have any history of lung disease?  (e.g., pulmonary embolus, asthma, emphysema)     *No Answer* 8. CAUSE: What do you think is causing the breathing problem?      *No Answer* 9. OTHER SYMPTOMS: Do you have any other symptoms? (e.g., chest pain, cough, dizziness, fever, runny nose)     *No Answer* 10. O2 SATURATION MONITOR:  Do you use an oxygen  saturation monitor (pulse oximeter) at home? If Yes, ask: What is your reading (oxygen  level) today? What is  your usual oxygen  saturation reading? (e.g., 95%)       *No Answer* 11. PREGNANCY: Is there any chance you are pregnant? When was your last menstrual period?       *No Answer* 12. TRAVEL: Have you traveled out of the country in the last month? (e.g., travel history, exposures)       *No Answer*  Protocols used: Breathing Difficulty-A-AH

## 2024-08-15 NOTE — Sepsis Progress Note (Signed)
 Sepsis protocol is being followed by [facility].

## 2024-08-15 NOTE — H&P (Signed)
 " [facility] History and Physical  Astor Gentle Hillmann FMW:[id number] DOB: [birth year] DOA: 08/15/2024  Referring physician: ED  PCP: Nichole Senior, MD   Patient is coming from: Home  Chief Complaint: Cough and shortness of breath  HPI:  Patient is a 69 years old male with past medical history of anxiety, asthma, COPD depression, diabetes mellitus, hyperlipidemia, hypothyroidism, seizures presented to hospital with shortness of breath.  Of note patient was recently hospitalized in September 2025 for pneumonia and was on home oxygen  since then.  He was again admitted and discharged from the hospital on 07/24/2024 for the same pneumonia.  At this time, due to increasing shortness of breath he had gone to his primary care provider when he was noted to have persistent pneumonia in the right upper lobe and was started on Levaquin  since last Friday.  Patient however has not been improving and had ongoing dyspnea on exertion and required up to 3 L from baseline 2 L with productive cough and chest pain while coughing.  He also complained of back pain due to excessive cough.    Patient denies any nausea vomiting fever chills or rigor.  Denies any urinary urgency, frequency or dysuria.  Denies any dizziness lightheadedness or syncope.  In the ED patient was noted to be slightly tachycardic and tachypneic but no fever.  Had diffuse wheezing.  Initial labs were noted for WBC at 11.3.  Lactate of 1.8.  Influenza COVID and RSV was negative.  CT angiogram of the chest showed no evidence of pulm embolism but complete occlusion of the right upper lobe bronchus with significant progression of right upper lobe consolidation since prior CT scan finding suggestive of possible postobstructive pneumonia or malignancy,  Bronchoscopy was advised.  In the ED, patient received Benadryl , DuoNebs, vancomycin  and cefepime , Benadryl , Solu-Medrol  40 mg x 1 and was considered for admission to the hospital for further evaluation and  treatment.   Assessment and Plan Principal Problem:   Recurrent pneumonia Active Problems:   Asthma, chronic, unspecified asthma severity, with acute exacerbation   HLD (hyperlipidemia)   Long term (current) use of insulin  (HCC)   Pneumonia  Recurrent/unresolving pneumonia.   Presenting with progressive shortness of breath and dyspnea.  No obvious respiratory distress.  On 3 nasal cannula oxygen .  Possibility of postobstructive pneumonia due to persistent surgery from previous CT scan with some progression   Of note patient had seen pulmonary as outpatient had plans for outpatient bronchoscopy with EBUS with Dr. Shelah on 09/04/2024.  Received vancomycin  and cefepime  in the ED will continue with Vanco and Unasyn .    Continue incentive spirometry, chest therapy bronchodilators.  Patient would benefit from pulmonary evaluation this admission.  History of asthma COPD continue bronchodilators and steroids. On oxygen  at home.  Continue inhaled steroids as well.  Diabetes mellitus type 2 with hyperglycemia.   Continue Accu-Cheks, diabetic diet.  Hyperglycemic due to steroids as well.  Will provide sliding scale insulin , long-acting insulin -adequate insulin  coverage.  Hemoglobin A1c 5 months back was 6.7.  Will repeat A1c.  Hyperlipidemia. Continue Statins.   Hypothyroidism.  Continue Synthroid .    BPH.  Continue finasteride  and tamsulosin .  Debility weakness deconditioning.  Will get PT OT evaluation.  DVT Prophylaxis: Lovenox  subcu  Review of Systems:  All systems were reviewed and were negative unless otherwise mentioned in the HPI   Past Medical History:  Diagnosis Date   Anxiety    Asthma    ASTHMATIC BRONCHITIS, ACUTE 10/25/2008   Bladder  neck obstruction    CARPAL TUNNEL SYNDROME, BILATERAL 07/31/2007   issues resolved, no surgery   Cervical disc disease    CORONARY ARTERY DISEASE 04/03/2007   Depression    DIABETES MELLITUS, TYPE I 04/03/2007   Diabetic retinopathy  associated with diabetes mellitus due to underlying condition (HCC) 04/03/2007   DM W/EYE MANIFESTATIONS, TYPE I, UNCONTROLLED 04/04/2007   DM W/RENAL MNFST, TYPE I, UNCONTROLLED 04/04/2007   ED (erectile dysfunction)    History of kidney stones    HYPERLIPIDEMIA 04/04/2007   Hypothyroidism    Pneumonia    several times and again today (02/13/2104)   Renal insufficiency    Seizures (HCC)    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; none in the last couple years (02/12/2014)   Spinal stenosis    Past Surgical History:  Procedure Laterality Date   ANTERIOR CERVICAL DECOMP/DISCECTOMY FUSION  2000   couple screws and a plate   ANTERIOR CERVICAL DECOMP/DISCECTOMY FUSION N/A 06/24/2016   Procedure: ANTERIOR CERVICAL DECOMPRESSION/DISCECTOMY FUSION CERVICAL FOUR - CERVICAL FIVE, CERVICAL FIVE - CERVICAL SIX; REMOVAL TETHER CERVICAL PLATE;  Surgeon: Lamar Peaches, MD;  Location: [facility];  Service: Neurosurgery;  Laterality: N/A;  ANTERIOR CERVICAL DECOMPRESSION/DISCECTOMY FUSION CERVICAL FOUR - CERVICAL FIVE, CERVICAL FIVE - CERVICAL SIX; REMOVAL TETHER CERVICAL PLATE   APPENDECTOMY     BACK SURGERY     CARDIAC CATHETERIZATION  1990's   CATARACT EXTRACTION W/ INTRAOCULAR LENS  IMPLANT, BILATERAL Bilateral    CYSTOSCOPY WITH RETROGRADE PYELOGRAM, URETEROSCOPY AND STENT PLACEMENT Bilateral 04/06/2013   Procedure: BILATERAL CYSTOSCOPY WITH RETROGRADE PYELOGRAMS, STENT PLACEMENTS AND LEFT URETEROSCOPY AND STONE REMOVAL;  Surgeon: Ricardo Likens, MD;  Location: [facility];  Service: Urology;  Laterality: Bilateral;   CYSTOSCOPY WITH STENT PLACEMENT Right 04/12/2013   Procedure: CYSTOSCOPY WITH STENT PLACEMENT;  Surgeon: Ricardo Likens, MD;  Location: [facility];  Service: Urology;  Laterality: Right;   CYSTOSCOPY/RETROGRADE/URETEROSCOPY Bilateral 04/12/2013   Procedure: CYSTOSCOPY/RETROGRADE/URETEROSCOPY;  Surgeon: Ricardo Likens, MD;  Location: [facility];  Service: Urology;  Laterality: Bilateral;  RIGHT RETROGRADE     EXCISION MASS LOWER EXTREMETIES Left 03/01/2024   Procedure: EXCISION MASS LOWER EXTREMITIES;  Surgeon: Teresa Lonni HERO, MD;  Location: [facility];  Service: General;  Laterality: Left;  REMOVAL OF LEFT INNER THIGH MASS   HOLMIUM LASER APPLICATION Left 04/06/2013   Procedure: HOLMIUM LASER APPLICATION;  Surgeon: Ricardo Likens, MD;  Location: [facility];  Service: Urology;  Laterality: Left;   LEFT HEART CATH AND CORONARY ANGIOGRAPHY N/A 04/29/2020   Procedure: LEFT HEART CATH AND CORONARY ANGIOGRAPHY;  Surgeon: Elmira Newman PARAS, MD;  Location: [facility];  Service: Cardiovascular;  Laterality: N/A;   LUMBAR LAMINECTOMY/DECOMPRESSION MICRODISCECTOMY N/A 04/20/2019   Procedure: Lumbar microdisectomy and decompression L5-S1 left;  Surgeon: Heide Ingle, MD;  Location: [facility];  Service: Orthopedics;  Laterality: N/A;    LYMPH NODE DISSECTION  ~ 1960   groin   stress cardiolite  09/06/2002   TONSILLECTOMY     VITRECTOMY Bilateral     Social History:  reports that he quit smoking about 4 years ago. His smoking use included cigarettes. He started smoking about 45 years ago. He has a 40 pack-year smoking history. He has been exposed to tobacco smoke. He has never used smokeless tobacco. He reports that he does not drink alcohol  and does not use drugs.  Allergies[1]  Family History  Problem Relation Age of Onset   Stroke Father        strong FH  cerebrovascular disease   Diabetes Brother    Heart disease Brother        CHF   Cancer Mother    Colon cancer Neg Hx      Prior to Admission medications  Medication Sig Start Date End Date Taking? Authorizing Provider  acetaminophen  (TYLENOL ) 500 MG tablet Take 1,000 mg by mouth every 6 (six) hours as needed for mild pain or headache.    [provider]  albuterol  (PROVENTIL ) (2.5 MG/3ML) 0.083% nebulizer solution Take 3 mLs by nebulization every 6 (six) hours as needed. 08/07/24   Charley Conger, PA-C  albuterol  (VENTOLIN   HFA) 108 (90 Base) MCG/ACT inhaler Inhale 2 puffs into the lungs every 6 (six) hours as needed for wheezing or shortness of breath. 07/03/24   Jude Harden GAILS, MD  aspirin  EC 81 MG tablet Take 1 tablet (81 mg total) by mouth daily. Swallow whole. Patient taking differently: Take 81 mg by mouth at bedtime. Swallow whole. 04/16/20   Tolia, Sunit, DO  bisacodyl  (DULCOLAX) 10 MG suppository Place 1 suppository (10 mg total) rectally daily as needed for moderate constipation. 07/24/24   Will Almarie MATSU, MD  cetirizine (ZYRTEC) 10 MG tablet Take 10 mg by mouth at bedtime.    [provider]  Continuous Blood Gluc Sensor (FREESTYLE LIBRE 3 SENSOR) MISC Change sensor every 14 days to monitor blood glucose continously 04/08/22     Continuous Glucose Sensor (FREESTYLE LIBRE 3 PLUS SENSOR) MISC Inject 1 Device into the skin See admin instructions. Place a new sensor into the skin every 15 days    [provider]  finasteride  (PROSCAR ) 5 MG tablet Take 1 tablet (5 mg total) by mouth daily. Patient taking differently: Take 5 mg by mouth at bedtime. 03/23/24     fluticasone -salmeterol (ADVAIR ) 250-50 MCG/ACT AEPB Inhale 1 puff into the lungs every 12 (twelve) hours. 05/03/24   Jude Harden GAILS, MD  Glucagon  (GVOKE HYPOPEN  2-PACK) 1 MG/0.2ML SOAJ Inject under the skin as directed for severe hypoglycemia. 06/17/22     Glucagon  (GVOKE HYPOPEN  2-PACK) 1 MG/0.2ML SOAJ Use as directed under the skin for severe hypoglycemia. 05/31/23     glucose blood (CONTOUR NEXT TEST) test strip USE 8 TIMES DAILY AS DIRECTED TO MONITOR BLOOD GLUCOSE 04/23/22     guaiFENesin  (MUCINEX ) 600 MG 12 hr tablet Take 2 tablets (1,200 mg total) by mouth 2 (two) times daily. 04/18/24   Ghimire, Kuber, MD  HYDROcodone -acetaminophen  (NORCO/VICODIN) 5-325 MG tablet Take 1 tablet by mouth 3 (three) times daily as needed. 08/02/24     insulin  aspart (NOVOLOG  FLEXPEN) 100 UNIT/ML FlexPen Inject up to 100 units subcutaneously per Borgeson as  directed (ICR 20, ISF 20, target 120) 04/02/24     insulin  glargine (LANTUS  SOLOSTAR) 100 UNIT/ML Solostar Pen Inject 25-30 Units into the skin daily as directed Patient taking differently: Inject 27 Units into the skin daily before breakfast. 10/20/23     Insulin  Pen Needle (TECHLITE PEN NEEDLES) 31G X 8 MM MISC Use 1 pen needle 4-5 times daily as directed. 11/30/22     Insulin  Pen Needle 31G X 8 MM MISC Use 1 pen needle subcutaneously 4 - 5 times daily as directed. 09/07/21     Insulin  Syringe-Needle U-100 31G X 5/16 0.3 ML MISC use to inject insulin  6 to 8 times daily 12/07/23   Nichole Senior, MD  ipratropium-albuterol  (DUONEB) 0.5-2.5 (3) MG/3ML SOLN Inhale 3 mLs into the lungs every 6 (six) hours as needed with nebulizer. 04/24/24  levofloxacin  (LEVAQUIN ) 500 MG tablet Take 1 tablet (500 mg total) by mouth 2 (two) times daily. 08/10/24     levofloxacin  (LEVAQUIN ) 750 MG tablet Take 1 tablet (750 mg total) by mouth daily for 14 days. 08/08/24 08/24/24  Charley Conger, PA-C  levothyroxine  (SYNTHROID ) 100 MCG tablet Take 1 tablet (100 mcg total) by mouth daily. Patient taking differently: Take 100 mcg by mouth at bedtime. 03/22/24     metoCLOPramide  (REGLAN ) 5 MG tablet Take 1 tablet (5 mg total) by mouth 2 (two) times daily before a meal. 05/14/24     ondansetron  (ZOFRAN ) 4 MG tablet Take 1 tablet (4 mg total) by mouth every 6 (six) hours as needed for nausea. 07/24/24   Will Almarie MATSU, MD  pantoprazole  (PROTONIX ) 40 MG tablet Take 1 tablet by mouth daily as directed. Patient taking differently: Take 40 mg by mouth at bedtime. 02/21/24     polyethylene glycol powder (GLYCOLAX /MIRALAX ) 17 GM/SCOOP powder Take 17 g by mouth 2 (two) times daily. Dissolve 1 capful (17g) in 4-8 ounces of liquid and take by mouth two times daily. 07/24/24   Will Almarie MATSU, MD  promethazine  (PHENERGAN ) 25 MG tablet Take 1 tablet (25 mg total) by mouth 3 (three) times daily as needed. 06/23/24     senna-docusate (SENOKOT-S)  8.6-50 MG tablet Take 3 tablets by mouth at bedtime. 07/24/24   Will Almarie MATSU, MD  simvastatin  (ZOCOR ) 40 MG tablet Take 1 tablet (40 mg total) by mouth daily. Patient taking differently: Take 40 mg by mouth at bedtime. 07/27/23     sodium chloride  (OCEAN) 0.65 % SOLN nasal spray Place 1 spray into both nostrils as needed for congestion.    [provider]  sodium chloride  HYPERTONIC 3 % nebulizer solution Use in nebulizer as directed 08/07/24   Charley Conger, PA-C  tamsulosin  (FLOMAX ) 0.4 MG CAPS capsule Take 1 capsule (0.4 mg total) by mouth daily. Patient taking differently: Take 0.4 mg by mouth at bedtime. 03/20/24     traZODone  (DESYREL ) 150 MG tablet Take 1 tablet (150 mg total) by mouth at bedtime as needed for sleep Patient taking differently: Take 150 mg by mouth See admin instructions. Take 150 mg by mouth at bedtime 02/19/24       Physical Exam:  Vitals:   08/15/24 1930 08/15/24 2000 08/15/24 2015 08/15/24 2100  BP: 123/73 135/67 122/72 136/65  Pulse: 98 96 95 96  Resp: (!) 24 (!) 28 14 (!) 23  Temp:    98.9 F (37.2 C)  TempSrc:      SpO2: 98% 99% 98% 100%   Wt Readings from Last 3 Encounters:  08/14/24 66.7 kg  08/07/24 67.1 kg  07/18/24 70.3 kg   There is no height or weight on file to calculate BMI.  General:  Average built, not in obvious distress, on nasal cannula oxygen , appears weak HENT: Normocephalic, No scleral pallor or icterus noted. Oral mucosa is moist.  Chest: Coarse breath sounds noted with expiratory wheezing, CVS: S1 &S2 heard. No murmur.  Regular rate and rhythm. Abdomen: Soft, nontender, nondistended.  Bowel sounds are heard. No abdominal mass palpated Extremities: No cyanosis, clubbing or edema.  Peripheral pulses are palpable. Psych: Alert, awake and oriented, normal mood CNS:  No cranial nerve deficits.  Moves all extremities Skin: Warm and dry.  No rashes noted.  Labs on Admission:   CBC: Recent Labs  Lab 08/15/24 1413  WBC 11.3*   HGB 10.7*  HCT 33.9*  MCV 93.1  PLT 422*    Basic Metabolic Panel: Recent Labs  Lab 08/15/24 1413  NA 137  K 4.0  CL 100  CO2 23  GLUCOSE 189*  BUN 14  CREATININE 0.74  CALCIUM 9.7    Liver Function Tests: No results for input(s): AST, ALT, ALKPHOS, BILITOT, PROT, ALBUMIN  in the last 168 hours. No results for input(s): LIPASE, AMYLASE in the last 168 hours. No results for input(s): AMMONIA in the last 168 hours.  Cardiac Enzymes: No results for input(s): CKTOTAL, CKMB, CKMBINDEX, TROPONINI in the last 168 hours.  BNP (last 3 results) No results for input(s): BNP in the last 8760 hours.  ProBNP (last 3 results) Recent Labs    07/18/24 2030  PROBNP 190.0     Radiological Exams on Admission: CT Angio Chest PE W and/or Wo Contrast Result Date: 08/15/2024 CLINICAL DATA:  Shortness of breath. EXAM: CT ANGIOGRAPHY CHEST WITH CONTRAST TECHNIQUE: Multidetector CT imaging of the chest was performed using the standard protocol during bolus administration of intravenous contrast. Multiplanar CT image reconstructions and MIPs were obtained to evaluate the vascular anatomy. RADIATION DOSE REDUCTION: This exam was performed according to the departmental dose-optimization program which includes automated exposure control, adjustment of the mA and/or kV according to patient size and/or use of iterative reconstruction technique. CONTRAST:  75mL OMNIPAQUE  IOHEXOL  350 MG/ML SOLN COMPARISON:  Chest CT dated 07/19/2024. FINDINGS: Cardiovascular: There is no cardiomegaly or pericardial effusion. There is coronary vascular calcification. Mild atherosclerotic calcification of the thoracic aorta. No aneurysmal dilatation or dissection. The origins of the great vessels of the aortic arch appear patent. No pulmonary artery embolus identified. Mediastinum/Nodes: Mildly enlarged lymph node anterior to the carina measures 12 mm short axis. Evaluation of the right hilar lymph  nodes is limited due to consolidative changes of the right lung. Mildly enlarged right the esophageal lymph node measures 10 mm short axis. Subcarinal lymph node measures approximately 9 mm short axis. The esophagus is grossly unremarkable. No mediastinal fluid collection. Lungs/Pleura: Large area of consolidation in the right upper lobe significantly progressed prior radiograph. There is diffuse interlobular septal thickening in the right apex. There is occlusion of the right upper lobe bronchus as well as mucous plugging in the right middle and right lower lobe bronchi. Trace right pleural effusion suspected. No pneumothorax. Upper Abdomen: No acute abnormality. Musculoskeletal: No acute osseous pathology. Review of the MIP images confirms the above findings. IMPRESSION: 1. No CT evidence of pulmonary artery embolus. 2. 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 Findings may represent worsening postobstructive pneumonia or a malignancy. Bronchoscopy may provide better evaluation. 3. Mildly enlarged mediastinal lymph nodes. 4.  Aortic Atherosclerosis (ICD10-I70.0). Electronically Signed   By: Vanetta Chou M.D.   On: 08/15/2024 20:57   DG Chest 2 View Result Date: 08/15/2024 EXAM: 2 VIEW(S) XRAY OF THE CHEST 08/15/2024 02:50:00 PM COMPARISON: 07/18/2024 CLINICAL HISTORY: Cough, shortness of breath. FINDINGS: LUNGS AND PLEURA: Interval increasing right upper lobe airspace disease with near complete opacification. Differential diagnosis includes pneumonia, atelectasis, or possibly a mass. Clinical correlation and further imaging may be warranted. No pleural effusion. No pneumothorax. HEART AND MEDIASTINUM: No acute abnormality of the cardiac and mediastinal silhouettes. BONES AND SOFT TISSUES: Partially visualized lower cervical fusion hardware. IMPRESSION: 1. Interval increase in right upper lobe airspace opacity with  near complete opacification, most consistent with pneumonia; differential includes lobar atelectasis from mucus plugging and an obstructing endobronchial  lesion/malignancy. 2. Recommend follow-up chest radiograph in 6-8 weeks to document resolution and exclude an underlying obstructing lesion; consider CT chest with IV contrast if findings fail to improve as expected. Electronically signed by: Waddell Calk MD 08/15/2024 03:20 PM EST RP Workstation: [id number]    EKG: Personally reviewed by me which shows sinus tachycardia   Consultant: None  Code Status: Full code  Microbiology blood cultures  Antibiotics: Vancomycin  and Unasyn   Family Communication:  Patients' condition and plan of care including tests being ordered have been discussed with the patient and the patient's spouse at bedside who indicate understanding and agree with the plan.   Status is: Inpatient    Severity of Illness: The appropriate patient status for this patient is INPATIENT. Inpatient status is judged to be reasonable and necessary in order to provide the required intensity of service to ensure the patient's safety. The patient's presenting symptoms, physical exam findings, and initial radiographic and laboratory data in the context of their chronic comorbidities is felt to place them at high risk for further clinical deterioration. Furthermore, it is not anticipated that the patient will be medically stable for discharge from the hospital within 2 midnights of admission.   * I certify that at the point of admission it is my clinical judgment that the patient will require inpatient hospital care spanning beyond 2 midnights from the point of admission due to high intensity of service, high risk for further deterioration and high frequency of surveillance required.*  Signed, Vernal Alstrom, MD [facility] 08/15/2024        [1]  Allergies Allergen Reactions   Augmentin  [Amoxicillin -Pot Clavulanate]  Nausea And Vomiting   Ivp Dye [Iodinated Contrast Media] Other (See Comments)    Dizziness/sweating ALSO   Lexapro [Escitalopram Oxalate] Itching   "

## 2024-08-16 ENCOUNTER — Telehealth (HOSPITAL_BASED_OUTPATIENT_CLINIC_OR_DEPARTMENT_OTHER): Payer: Self-pay

## 2024-08-16 DIAGNOSIS — J189 Pneumonia, unspecified organism: Secondary | ICD-10-CM | POA: Diagnosis not present

## 2024-08-16 LAB — GLUCOSE, CAPILLARY
Glucose-Capillary: 69 mg/dL — ABNORMAL LOW (ref 70–99)
Glucose-Capillary: 99 mg/dL (ref 70–99)
Glucose-Capillary: 77 mg/dL (ref 70–99)
Glucose-Capillary: 60 mg/dL — ABNORMAL LOW (ref 70–99)

## 2024-08-16 LAB — BASIC METABOLIC PANEL WITH GFR
Anion gap: 10 (ref 5–15)
BUN: 15 mg/dL (ref 8–23)
CO2: 24 mmol/L (ref 22–32)
Calcium: 9.2 mg/dL (ref 8.9–10.3)
Chloride: 102 mmol/L (ref 98–111)
Creatinine, Ser: 0.69 mg/dL (ref 0.61–1.24)
GFR, Estimated: >60 mL/min
Glucose, Bld: 324 mg/dL — ABNORMAL HIGH (ref 70–99)
Potassium: 4.4 mmol/L (ref 3.5–5.1)
Sodium: 135 mmol/L (ref 135–145)

## 2024-08-16 LAB — CBC
HCT: 29 % — ABNORMAL LOW (ref 39.0–52.0)
Hemoglobin: 9.2 g/dL — ABNORMAL LOW (ref 13.0–17.0)
MCH: 29.3 pg (ref 26.0–34.0)
MCHC: 31.7 g/dL (ref 30.0–36.0)
MCV: 92.4 fL (ref 80.0–100.0)
Platelets: 376 10*3/uL (ref 150–400)
RBC: 3.14 MIL/uL — ABNORMAL LOW (ref 4.22–5.81)
RDW: 13.2 % (ref 11.5–15.5)
WBC: 8.3 10*3/uL (ref 4.0–10.5)
nRBC: 0 % (ref 0.0–0.2)

## 2024-08-16 LAB — MRSA NEXT GEN BY PCR, NASAL: MRSA by PCR Next Gen: NOT DETECTED

## 2024-08-16 LAB — HEMOGLOBIN A1C
Hgb A1c MFr Bld: 6.7 % — ABNORMAL HIGH (ref 4.8–5.6)
Mean Plasma Glucose: 145.59 mg/dL

## 2024-08-16 LAB — CBG MONITORING, ED: Glucose-Capillary: 224 mg/dL — ABNORMAL HIGH (ref 70–99)

## 2024-08-16 LAB — MAGNESIUM: Magnesium: 2 mg/dL (ref 1.7–2.4)

## 2024-08-16 MED ORDER — ENSURE PLUS HIGH PROTEIN PO LIQD
237.0000 mL | Freq: Two times a day (BID) | ORAL | Status: DC
  Administered 2024-08-16 – 2024-08-20 (×4): 237 mL via ORAL

## 2024-08-16 MED ORDER — LIDOCAINE 5 % EX PTCH
1.0000 | MEDICATED_PATCH | CUTANEOUS | Status: AC
  Administered 2024-08-16 – 2024-08-17 (×2): 1 via TRANSDERMAL
  Filled 2024-08-16 (×2): qty 1

## 2024-08-16 MED ORDER — HYDROMORPHONE HCL 1 MG/ML IJ SOLN
0.5000 mg | INTRAMUSCULAR | Status: AC | PRN
  Administered 2024-08-16 – 2024-08-17 (×2): 0.5 mg via INTRAVENOUS
  Filled 2024-08-16 (×3): qty 0.5

## 2024-08-16 MED ORDER — DEXTROSE 5 % IV SOLN: INTRAVENOUS | Status: DC

## 2024-08-16 MED ORDER — SACCHAROMYCES BOULARDII 250 MG PO CAPS
250.0000 mg | ORAL_CAPSULE | Freq: Two times a day (BID) | ORAL | Status: DC
  Administered 2024-08-16 – 2024-08-21 (×9): 250 mg via ORAL
  Filled 2024-08-16 (×10): qty 1

## 2024-08-16 MED ORDER — IPRATROPIUM-ALBUTEROL 0.5-2.5 (3) MG/3ML IN SOLN
3.0000 mL | Freq: Four times a day (QID) | RESPIRATORY_TRACT | Status: DC | PRN
  Administered 2024-08-16 – 2024-08-19 (×6): 3 mL via RESPIRATORY_TRACT
  Filled 2024-08-16 (×6): qty 3

## 2024-08-16 MED ORDER — ALPRAZOLAM 0.25 MG PO TABS
0.2500 mg | ORAL_TABLET | Freq: Once | ORAL | Status: AC
  Administered 2024-08-16: 0.25 mg via ORAL
  Filled 2024-08-16: qty 1

## 2024-08-16 MED ORDER — GUAIFENESIN-CODEINE 100-10 MG/5ML PO SOLN
5.0000 mL | ORAL | Status: DC | PRN
  Administered 2024-08-16: 5 mL via ORAL
  Filled 2024-08-16: qty 5

## 2024-08-16 MED ORDER — SODIUM CHLORIDE 0.9 % IV SOLN
2.0000 g | Freq: Three times a day (TID) | INTRAVENOUS | Status: DC
  Administered 2024-08-16 – 2024-08-21 (×15): 2 g via INTRAVENOUS
  Filled 2024-08-16 (×15): qty 12.5

## 2024-08-16 NOTE — Consult Note (Addendum)
 "  NAME:  James [name], MRN:  [id number], DOB:  1956/05/27, LOS: 1 ADMISSION DATE:  08/15/2024, CONSULTATION DATE: 08/16/2024 REFERRING MD: Dr. Mcarthur, CHIEF COMPLAINT: Postobstructive pneumonia  History of Present Illness:  Patient came in with cough, shortness of breath Was seen in the pulmonary clinic, scheduled for outpatient bronchoscopy Came in because his breathing felt a little bit worse than usual Has had multiple courses of antibiotics and treatment for his pneumonia During his last admission he was prescribed home oxygen  which he uses periodically  No hemoptysis, some chest discomfort from coughing he has some nausea, no abdominal pain, no vomiting  Pertinent  Medical History   Past Medical History:  Diagnosis Date   Anxiety    Asthma    ASTHMATIC BRONCHITIS, ACUTE 10/25/2008   Bladder neck obstruction    CARPAL TUNNEL SYNDROME, BILATERAL 07/31/2007   issues resolved, no surgery   Cervical disc disease    CORONARY ARTERY DISEASE 04/03/2007   Depression    DIABETES MELLITUS, TYPE I 04/03/2007   Diabetic retinopathy associated with diabetes mellitus due to underlying condition (HCC) 04/03/2007   DM W/EYE MANIFESTATIONS, TYPE I, UNCONTROLLED 04/04/2007   DM W/RENAL MNFST, TYPE I, UNCONTROLLED 04/04/2007   ED (erectile dysfunction)    History of kidney stones    HYPERLIPIDEMIA 04/04/2007   Hypothyroidism    Pneumonia    several times and again today (02/13/2104)   Renal insufficiency    Seizures (HCC)    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 rate (!) 27, SpO2 98%.        Intake/Output Summary (Last 24 hours) at 08/16/2024 1147 Last data filed at 08/15/2024 1808 Gross per 24 hour  Intake 2294.68 ml  Output --  Net 2294.68 ml   There were no vitals filed for this visit.  Examination: General: Elderly, does not appear to be in distress HENT: Moist oral mucosa Lungs: Rhonchi bilaterally Cardiovascular: S1-S2 appreciated Abdomen: Soft, bowel sounds appreciated Extremities: No clubbing, no edema Neuro: Awake alert oriented x 3 GU:   I reviewed last 24 h vitals and pain scores, last 48 h intake and output, last 24 h labs and trends, and last 24 h imaging results.  Chem-7 within normal limits, sugar is elevated white count of 8.3 hematocrit of 29,  Resolved problem list   Assessment and Plan   Postobstructive pneumonia - Already has a scheduled for 2/17 for outpatient bronchoscopy - Worsening symptoms - Was recently treated for pneumonia with a course of Levaquin   . Best antibiotic will be to have him on Augmentin  - GI side effects from Augmentin  can be ameliorated by making sure he takes it with meals, yogurt, probiotic - Ordered Florastor - 14 days of augmentin     Can tentatively plan for discharge 08/17/2024 on Augmentin     Labs   CBC: Recent Labs  Lab 08/15/24 1413 08/16/24 0402  WBC 11.3* 8.3  HGB 10.7* 9.2*  HCT 33.9* 29.0*  MCV 93.1 92.4  PLT 422* 376    Basic Metabolic Panel: Recent Labs  Lab 08/15/24 1413  08/16/24 0402  NA 137 135  K 4.0 4.4  CL 100 102  CO2 23 24  GLUCOSE 189* 324*  BUN 14 15  CREATININE 0.74 0.69  CALCIUM 9.7 9.2  MG  --  2.0   GFR: Estimated Creatinine Clearance: 82.2 mL/min (by C-G formula based on SCr of 0.69 mg/dL). Recent Labs  Lab 08/15/24 1413 08/15/24 1557 08/16/24 0402  WBC 11.3*  --  8.3  LATICACIDVEN  --  1.8  --     Liver Function Tests: No results for input(s): AST, ALT, ALKPHOS, BILITOT, PROT,  ALBUMIN  in the last 168 hours. No results for input(s): LIPASE, AMYLASE in the last 168 hours. No results for input(s): AMMONIA in the last 168 hours.  ABG    Component Value Date/Time   HCO3 22.0 04/11/2013 0749   TCO2 23 04/29/2020 1350   ACIDBASEDEF 3.1 (H) 04/11/2013 0749   O2SAT 51.4 04/11/2013 0749     Coagulation Profile: No results for input(s): INR, PROTIME in the last 168 hours.  Cardiac Enzymes: No results for input(s): CKTOTAL, CKMB, CKMBINDEX, TROPONINI in the last 168 hours.  HbA1C: Hgb A1c MFr Bld  Date/Time Value Ref Range Status  08/16/2024 04:02 AM 6.7 (H) 4.8 - 5.6 % Final    Comment:    (NOTE) Diagnosis of Diabetes The following HbA1c ranges recommended by the American Diabetes Association (ADA) may be used as an aid in the diagnosis of diabetes mellitus.  Hemoglobin             Suggested A1C NGSP%              Diagnosis  <5.7                   Non Diabetic  5.7-6.4                Pre-Diabetic  >6.4                   Diabetic  <7.0                   Glycemic control for                       adults with diabetes.    02/23/2024 08:24 AM 6.7 (H) 4.8 - 5.6 % Final    Comment:    (NOTE)         Prediabetes: 5.7 - 6.4         Diabetes: >6.4         Glycemic control for adults with diabetes: <7.0     CBG: Recent Labs  Lab 08/15/24 2212 08/16/24 0803  GLUCAP 271* 224*    Review of Systems:   Shortness of breath  Past Medical History:  He,  has a past medical history of Anxiety, Asthma, ASTHMATIC BRONCHITIS, ACUTE (10/25/2008), Bladder neck obstruction, CARPAL TUNNEL SYNDROME, BILATERAL (07/31/2007), Cervical disc disease, CORONARY ARTERY DISEASE (04/03/2007), Depression, DIABETES MELLITUS, TYPE I (04/03/2007), Diabetic retinopathy associated with diabetes mellitus due to underlying condition (HCC) (04/03/2007), DM W/EYE MANIFESTATIONS, TYPE I, UNCONTROLLED (04/04/2007), DM W/RENAL MNFST, TYPE I, UNCONTROLLED  (04/04/2007), ED (erectile dysfunction), History of kidney stones, HYPERLIPIDEMIA (04/04/2007), Hypothyroidism, Pneumonia, Renal insufficiency, Seizures (HCC), and Spinal stenosis.   Surgical History:   Past Surgical History:  Procedure Laterality Date   ANTERIOR CERVICAL DECOMP/DISCECTOMY FUSION  2000   couple screws and a plate   ANTERIOR CERVICAL DECOMP/DISCECTOMY FUSION N/A 06/24/2016   Procedure: ANTERIOR  CERVICAL DECOMPRESSION/DISCECTOMY FUSION CERVICAL FOUR - CERVICAL FIVE, CERVICAL FIVE - CERVICAL SIX; REMOVAL TETHER CERVICAL PLATE;  Surgeon: Lamar Peaches, MD;  Location: [facility] OR;  Service: Neurosurgery;  Laterality: N/A;  ANTERIOR CERVICAL DECOMPRESSION/DISCECTOMY FUSION CERVICAL FOUR - CERVICAL FIVE, CERVICAL FIVE - CERVICAL SIX; REMOVAL TETHER CERVICAL PLATE   APPENDECTOMY     BACK SURGERY     CARDIAC CATHETERIZATION  1990's   CATARACT EXTRACTION W/ INTRAOCULAR LENS  IMPLANT, BILATERAL Bilateral    CYSTOSCOPY WITH RETROGRADE PYELOGRAM, URETEROSCOPY AND STENT PLACEMENT Bilateral 04/06/2013   Procedure: BILATERAL CYSTOSCOPY WITH RETROGRADE PYELOGRAMS, STENT PLACEMENTS AND LEFT URETEROSCOPY AND STONE REMOVAL;  Surgeon: Ricardo Likens, MD;  Location: [facility];  Service: Urology;  Laterality: Bilateral;   CYSTOSCOPY WITH STENT PLACEMENT Right 04/12/2013   Procedure: CYSTOSCOPY WITH STENT PLACEMENT;  Surgeon: Ricardo Likens, MD;  Location: [facility];  Service: Urology;  Laterality: Right;   CYSTOSCOPY/RETROGRADE/URETEROSCOPY Bilateral 04/12/2013   Procedure: CYSTOSCOPY/RETROGRADE/URETEROSCOPY;  Surgeon: Ricardo Likens, MD;  Location: [facility];  Service: Urology;  Laterality: Bilateral;  RIGHT RETROGRADE    EXCISION MASS LOWER EXTREMETIES Left 03/01/2024   Procedure: EXCISION MASS LOWER EXTREMITIES;  Surgeon: Teresa Lonni HERO, MD;  Location: [facility];  Service: General;  Laterality: Left;  REMOVAL OF LEFT INNER THIGH MASS   HOLMIUM LASER APPLICATION Left 04/06/2013   Procedure: HOLMIUM LASER  APPLICATION;  Surgeon: Ricardo Likens, MD;  Location: [facility];  Service: Urology;  Laterality: Left;   LEFT HEART CATH AND CORONARY ANGIOGRAPHY N/A 04/29/2020   Procedure: LEFT HEART CATH AND CORONARY ANGIOGRAPHY;  Surgeon: Elmira Newman PARAS, MD;  Location: [facility];  Service: Cardiovascular;  Laterality: N/A;   LUMBAR LAMINECTOMY/DECOMPRESSION MICRODISCECTOMY N/A 04/20/2019   Procedure: Lumbar microdisectomy and decompression L5-S1 left;  Surgeon: Heide Ingle, MD;  Location: [facility];  Service: Orthopedics;  Laterality: N/A;    LYMPH NODE DISSECTION  ~ 1960   groin   stress cardiolite  09/06/2002   TONSILLECTOMY     VITRECTOMY Bilateral      Social History:   reports that he quit smoking about 4 years ago. His smoking use included cigarettes. He started smoking about 45 years ago. He has a 40 pack-year smoking history. He has been exposed to tobacco smoke. He has never used smokeless tobacco. He reports that he does not drink alcohol  and does not use drugs.   Family History:  His family history includes Cancer in his mother; Diabetes in his brother; Heart disease in his brother; Stroke in his father. There is no history of Colon cancer.   Allergies Allergies[1]   Jennet Epley, MD [place] [facility] Pager: See Amion      [1]  Allergies Allergen Reactions   Augmentin  [Amoxicillin -Pot Clavulanate] Nausea And Vomiting   Ivp Dye [Iodinated Contrast Media] Other (See Comments)    Dizziness/sweating ALSO   Lexapro [Escitalopram Oxalate] Itching   "

## 2024-08-16 NOTE — Inpatient Diabetes Management (Signed)
 Inpatient Diabetes Program Recommendations  AACE/ADA: New Consensus Statement on Inpatient Glycemic Control (2015)  Target Ranges:  Prepandial:   less than 140 mg/dL      Peak postprandial:   less than 180 mg/dL (1-2 hours)      Critically ill patients:  140 - 180 mg/dL   Lab Results  Component Value Date   GLUCAP 224 (H) 08/16/2024   HGBA1C 6.7 (H) 02/23/2024    Review of Glycemic Control  Latest Reference Range & Units 08/15/24 22:12 08/16/24 08:03  Glucose-Capillary 70 - 99 mg/dL 728 (H) 775 (H)  (H): Data is abnormally high  Diabetes history: DM1(does not make insulin .  Needs correction, basal and meal coverage)  Outpatient Diabetes medications:  Lantus  27 units every [place] Novolog  1 units for every 20 mg/dL > 879 mg/dL, TID + Novolog  1 units for every 20 carbs TID, Freestyle Libre 3 CGM  Current orders for Inpatient glycemic control:  Semgl;e 30 units every Pam Novolog  0-15 units TID and 0-5 units at bedtime  Inpatient Diabetes Recommendations:  Once eating, please consider:  Novolog  4 units TID if he consumes at least 50%  Met with patient in ED at bedside.  Confirmed T1DM and above insulins.  Last took Lantus  yesterday morning.  Received Solumedrol 125 mg yesterday at 13:55.    Will continue to follow while in patient.  Thank you, Wyvonna Pinal, MSN, CDCES Diabetes Coordinator Inpatient Diabetes Program [phone] (team pager from 8a-5p)

## 2024-08-16 NOTE — Telephone Encounter (Signed)
 Copied from CRM [id number]. Topic: Clinical - Medical Advice >> Aug 16, 2024 11:55 AM Leila C wrote: Reason for CRM: Patient's spouse Loa [phone] wants to speak with NT Karna, yesterday regarding with patient's plan of care and assistant with something, will not provided any other information. Informed [name], NT takes care of acute issues and reports back to provider, to discuss plan of care, [name] will need to speak with Dr. Cyndi nurse. [name] wants to speak with Dr. Cyndi nurse today, an urgent matter, to discuss patient's care that is not what was advised from PA, Gillis on 08/07/24 and 08/14/24 . Patient is at [facility]. Unable to reach [facility], the call went to [facility]. Please advise and call back.

## 2024-08-16 NOTE — Progress Notes (Signed)
" °  Case request for bronchoscopy was made  Scheduled for bronchoscopy at 1130 08/17/2024 for right upper lobe endobronchial lesion with adenopathy  "

## 2024-08-16 NOTE — Telephone Encounter (Signed)
 Spoke with pts spouse and she would like you to become involved in his care while he is a James [name] he is still currently in the ED waiting on a bed she is asking for his bronch to be done while he is inpatient and they are telling him they are going to DC tomorrow and to keep bronch appt for February date. She said Candis advised them on 1/27 that if he was admitted she should hard push for bronch to be done while in hospital.

## 2024-08-16 NOTE — Progress Notes (Signed)
 " PROGRESS NOTE    James [name]  FMW:[id number] DOB: 02/23/1956 DOA: 08/15/2024 PCP: Nichole Senior, MD   Brief Narrative:   Patient is a 69 years old male with past medical history of anxiety, asthma, COPD depression, diabetes mellitus, hyperlipidemia, hypothyroidism, seizures presented to hospital with shortness of breath.  Found to have recurrent pneumonia.  Was hospitalized in September and again in January 6th for pneumonia.   Unfortunately he is having recurrent symptoms.  Patient admitted for recurrent pneumonia with CT findings of complete occlusion of the right upper lobe bronchus with significant progression of the right upper lobe consolidation. 11.3, lactic acid 1.8, influenza, COVID-19 and RSV negative on admission.  On 3 L/min supplemental oxygen .  Assessment & Plan:   Principal Problem:   Recurrent pneumonia Active Problems:   Asthma, chronic, unspecified asthma severity, with acute exacerbation   HLD (hyperlipidemia)   Long term (current) use of insulin  (HCC)   Pneumonia   Recurrent/unresolving pneumonia.   Presenting with progressive shortness of breath and dyspnea.  No obvious respiratory distress.    CT chest 1/28: IMPRESSION: 1. No CT evidence of pulmonary artery embolus. 2. 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 Findings may represent worsening postobstructive pneumonia or a malignancy. Bronchoscopy may provide better evaluation. 3. Mildly enlarged mediastinal lymph nodes. 4.  Aortic Atherosclerosis (ICD10-I70.0).  - Pulmonology consulted today.  Planning for bronchoscopy 1/30. -Will continue antibiotics.  Switched to cefepime .  Follow-up on cultures.   History of asthma COPD continue bronchodilators and steroids nebulizer. On oxygen  at home.    Diabetes mellitus type 2 with hyperglycemia.   Continue Accu-Cheks, diabetic diet.  Hyperglycemic due to steroids as  well.  Will provide sliding scale insulin , long-acting insulin -adequate insulin  coverage.  Hemoglobin A1c 5 months back was 6.7.  Will repeat A1c.   Hyperlipidemia. Continue Statins.    Hypothyroidism.  Continue Synthroid .     BPH.  Continue finasteride  and tamsulosin .   Debility weakness deconditioning.  PT/OT evaluation DVT Prophylaxis: Lovenox  subcu      Consultants: Pulmonology  Procedures: Planning for bronc  Antimicrobials: Cefepime    Subjective:  Patient seen and examined at the bedside.  He reports a postnasal secretions.  Denies ongoing cough.  Denies fever.  Denies chest pain.  Was seen by pulmonology and plan for bronchoscopy on Friday.  Objective: Vitals:   08/16/24 0600 08/16/24 0822 08/16/24 1404 08/16/24 1426  BP: (!) 145/65 133/82 109/63 115/61  Pulse: 67 (!) 111 83 76  Resp: 19 (!) 27 (!) 24 16  Temp:  (!) 97.4 F (36.3 C) 98.1 F (36.7 C) 97.8 F (36.6 C)  TempSrc:  Oral Oral Oral  SpO2: 98% 98% 100% 98%    Intake/Output Summary (Last 24 hours) at 08/16/2024 1513 Last data filed at 08/15/2024 1808 Gross per 24 hour  Intake 2294.68 ml  Output --  Net 2294.68 ml   There were no vitals filed for this visit.  Examination:  General: Alert, oriented not in any acute distress Chest: Diminished bilaterally, bilateral crackles, no wheezing CVs: S1, S2, no murmur, regular rhythm Abdomen: Soft, nontender Extremities: No edema   Data Reviewed: I have personally reviewed following labs and imaging studies  CBC: Recent Labs  Lab 08/15/24 1413 08/16/24 0402  WBC 11.3* 8.3  HGB 10.7* 9.2*  HCT 33.9* 29.0*  MCV 93.1 92.4  PLT 422* 376   Basic Metabolic Panel: Recent Labs  Lab  08/15/24 1413 08/16/24 0402  NA 137 135  K 4.0 4.4  CL 100 102  CO2 23 24  GLUCOSE 189* 324*  BUN 14 15  CREATININE 0.74 0.69  CALCIUM 9.7 9.2  MG  --  2.0   GFR: Estimated Creatinine Clearance: 82.2 mL/min (by C-G formula based on SCr of 0.69 mg/dL). Liver  Function Tests: No results for input(s): AST, ALT, ALKPHOS, BILITOT, PROT, ALBUMIN  in the last 168 hours. No results for input(s): LIPASE, AMYLASE in the last 168 hours. No results for input(s): AMMONIA in the last 168 hours. Coagulation Profile: No results for input(s): INR, PROTIME in the last 168 hours. Cardiac Enzymes: No results for input(s): CKTOTAL, CKMB, CKMBINDEX, TROPONINI in the last 168 hours. BNP (last 3 results) Recent Labs    07/18/24 2030  PROBNP 190.0   HbA1C: Recent Labs    08/16/24 0402  HGBA1C 6.7*   CBG: Recent Labs  Lab 08/15/24 2212 08/16/24 0803  GLUCAP 271* 224*   Lipid Profile: No results for input(s): CHOL, HDL, LDLCALC, TRIG, CHOLHDL, LDLDIRECT in the last 72 hours. Thyroid  Function Tests: No results for input(s): TSH, T4TOTAL, FREET4, T3FREE, THYROIDAB in the last 72 hours. Anemia Panel: No results for input(s): VITAMINB12, FOLATE, FERRITIN, TIBC, IRON, RETICCTPCT in the last 72 hours. Sepsis Labs: Recent Labs  Lab 08/15/24 1557  LATICACIDVEN 1.8    Recent Results (from the past 240 hours)  Resp panel by RT-PCR (RSV, Flu A&B, Covid) Anterior Nasal Swab     Status: None   Collection Time: 08/15/24  2:13 PM   Specimen: Anterior Nasal Swab  Result Value Ref Range Status   SARS Coronavirus 2 by RT PCR NEGATIVE NEGATIVE Final    Comment: (NOTE) SARS-CoV-2 target nucleic acids are NOT DETECTED.  The SARS-CoV-2 RNA is generally detectable in upper respiratory specimens during the acute phase of infection. The lowest concentration of SARS-CoV-2 viral copies this assay can detect is 138 copies/mL. A negative result does not preclude SARS-Cov-2 infection and should not be used as the sole basis for treatment or other patient management decisions. A negative result may occur with  improper specimen collection/handling, submission of specimen other than nasopharyngeal swab,  presence of viral mutation(s) within the areas targeted by this assay, and inadequate number of viral copies(<138 copies/mL). A negative result must be combined with clinical observations, patient history, and epidemiological information. The expected result is Negative.  Fact Sheet for Patients:  bloggercourse.com  Fact Sheet for Healthcare Providers:  seriousbroker.it  This test is no t yet approved or cleared by the [place]  FDA and  has been authorized for detection and/or diagnosis of SARS-CoV-2 by FDA under an Emergency Use Authorization (EUA). This EUA will remain  in effect (meaning this test can be used) for the duration of the COVID-19 declaration under Section 564(b)(1) of the Act, 21 U.S.C.section 360bbb-3(b)(1), unless the authorization is terminated  or revoked sooner.       Influenza A by PCR NEGATIVE NEGATIVE Final   Influenza B by PCR NEGATIVE NEGATIVE Final    Comment: (NOTE) The Xpert Xpress SARS-CoV-2/FLU/RSV plus assay is intended as an aid in the diagnosis of influenza from Nasopharyngeal swab specimens and should not be used as a sole basis for treatment. Nasal washings and aspirates are unacceptable for Xpert Xpress SARS-CoV-2/FLU/RSV testing.  Fact Sheet for Patients: bloggercourse.com  Fact Sheet for Healthcare Providers: seriousbroker.it  This test is not yet approved or cleared by the [place]  FDA and has been  authorized for detection and/or diagnosis of SARS-CoV-2 by FDA under an Emergency Use Authorization (EUA). This EUA will remain in effect (meaning this test can be used) for the duration of the COVID-19 declaration under Section 564(b)(1) of the Act, 21 U.S.C. section 360bbb-3(b)(1), unless the authorization is terminated or revoked.     Resp Syncytial Virus by PCR NEGATIVE NEGATIVE Final    Comment: (NOTE) Fact Sheet for  Patients: bloggercourse.com  Fact Sheet for Healthcare Providers: seriousbroker.it  This test is not yet approved or cleared by the [place]  FDA and has been authorized for detection and/or diagnosis of SARS-CoV-2 by FDA under an Emergency Use Authorization (EUA). This EUA will remain in effect (meaning this test can be used) for the duration of the COVID-19 declaration under Section 564(b)(1) of the Act, 21 U.S.C. section 360bbb-3(b)(1), unless the authorization is terminated or revoked.  Performed at [facility], [address], [place], [place] [address]   Blood culture (routine x 2)     Status: None (Preliminary result)   Collection Time: 08/15/24  3:50 PM   Specimen: BLOOD  Result Value Ref Range Status   Specimen Description   Final    BLOOD LEFT ANTECUBITAL Performed at [facility], [address] [place], [place] [address]    Special Requests   Final    BOTTLES DRAWN AEROBIC AND ANAEROBIC Blood Culture adequate volume Performed at [facility], [address] [place], [place] [address]    Culture   Final    NO GROWTH < 12 HOURS Performed at [facility], [address] [place], [place] [address]    Report Status PENDING  Incomplete  Blood culture (routine x 2)     Status: None (Preliminary result)   Collection Time: 08/16/24  4:02 AM   Specimen: BLOOD RIGHT ARM  Result Value Ref Range Status   Specimen Description   Final    BLOOD RIGHT ARM Performed at [facility], [address], [place], [place] [address]    Special Requests   Final    BOTTLES DRAWN AEROBIC AND ANAEROBIC Blood Culture adequate volume Performed at [facility], [address], [place], [place] [address]    Culture PENDING  Incomplete   Report Status PENDING  Incomplete         Radiology Studies: CT Angio Chest PE W and/or Wo  Contrast Result Date: 08/15/2024 CLINICAL DATA:  Shortness of breath. EXAM: CT ANGIOGRAPHY CHEST WITH CONTRAST TECHNIQUE: Multidetector CT imaging of the chest was performed using the standard protocol during bolus administration of intravenous contrast. Multiplanar CT image reconstructions and MIPs were obtained to evaluate the vascular anatomy. RADIATION DOSE REDUCTION: This exam was performed according to the departmental dose-optimization program which includes automated exposure control, adjustment of the mA and/or kV according to patient size and/or use of iterative reconstruction technique. CONTRAST:  75mL OMNIPAQUE  IOHEXOL  350 MG/ML SOLN COMPARISON:  Chest CT dated 07/19/2024. FINDINGS: Cardiovascular: There is no cardiomegaly or pericardial effusion. There is coronary vascular calcification. Mild atherosclerotic calcification of the thoracic aorta. No aneurysmal dilatation or dissection. The origins of the great vessels of the aortic arch appear patent. No pulmonary artery embolus identified. Mediastinum/Nodes: Mildly enlarged lymph node anterior to the carina measures 12 mm short axis. Evaluation of the right hilar lymph nodes is limited due to consolidative changes of the right lung. Mildly enlarged right the esophageal lymph node measures 10 mm short axis. Subcarinal lymph  node measures approximately 9 mm short axis. The esophagus is grossly unremarkable. No mediastinal fluid collection. Lungs/Pleura: Large area of consolidation in the right upper lobe significantly progressed prior radiograph. There is diffuse interlobular septal thickening in the right apex. There is occlusion of the right upper lobe bronchus as well as mucous plugging in the right middle and right lower lobe bronchi. Trace right pleural effusion suspected. No pneumothorax. Upper Abdomen: No acute abnormality. Musculoskeletal: No acute osseous pathology. Review of the MIP images confirms the above findings. IMPRESSION: 1. No CT  evidence of pulmonary artery embolus. 2. 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 Findings may represent worsening postobstructive pneumonia or a malignancy. Bronchoscopy may provide better evaluation. 3. Mildly enlarged mediastinal lymph nodes. 4.  Aortic Atherosclerosis (ICD10-I70.0). Electronically Signed   By: Vanetta Chou M.D.   On: 08/15/2024 20:57   DG Chest 2 View Result Date: 08/15/2024 EXAM: 2 VIEW(S) XRAY OF THE CHEST 08/15/2024 02:50:00 PM COMPARISON: 07/18/2024 CLINICAL HISTORY: Cough, shortness of breath. FINDINGS: LUNGS AND PLEURA: Interval increasing right upper lobe airspace disease with near complete opacification. Differential diagnosis includes pneumonia, atelectasis, or possibly a mass. Clinical correlation and further imaging may be warranted. No pleural effusion. No pneumothorax. HEART AND MEDIASTINUM: No acute abnormality of the cardiac and mediastinal silhouettes. BONES AND SOFT TISSUES: Partially visualized lower cervical fusion hardware. IMPRESSION: 1. Interval increase in right upper lobe airspace opacity with near complete opacification, most consistent with pneumonia; differential includes lobar atelectasis from mucus plugging and an obstructing endobronchial lesion/malignancy. 2. Recommend follow-up chest radiograph in 6-8 weeks to document resolution and exclude an underlying obstructing lesion; consider CT chest with IV contrast if findings fail to improve as expected. Electronically signed by: Taylor Stroud MD 08/15/2024 03:20 PM EST RP Workstation: [id number]        Scheduled Meds:  budesonide  (PULMICORT ) nebulizer solution  0.25 mg Nebulization BID   enoxaparin  (LOVENOX ) injection  40 mg Subcutaneous Q24H   feeding supplement  237 mL Oral BID BM   finasteride   5 mg Oral QHS   insulin  aspart  0-15 Units Subcutaneous TID WC   insulin  aspart  0-5 Units Subcutaneous QHS    insulin  glargine-yfgn  30 Units Subcutaneous Daily   levothyroxine   100 mcg Oral QHS   loratadine   10 mg Oral Daily   metoCLOPramide   5 mg Oral BID AC   pantoprazole   40 mg Oral QHS   polyethylene glycol  17 g Oral BID   saccharomyces boulardii  250 mg Oral BID   senna-docusate  3 tablet Oral QHS   simvastatin   40 mg Oral QHS   sodium chloride  flush  3 mL Intravenous Q12H   tamsulosin   0.4 mg Oral QHS   Continuous Infusions:  sodium chloride      ceFEPime  (MAXIPIME ) IV 2 g (08/16/24 1449)          Derryl Duval, MD [facility] 08/16/2024, 3:13 PM   "

## 2024-08-16 NOTE — Plan of Care (Signed)
" °  Problem: Metabolic: Goal: Ability to maintain appropriate glucose levels will improve Outcome: Progressing   Problem: Activity: Goal: Ability to tolerate increased activity will improve Outcome: Progressing   Problem: Respiratory: Goal: Ability to maintain a clear airway will improve Outcome: Progressing   Problem: Activity: Goal: Risk for activity intolerance will decrease Outcome: Progressing   "

## 2024-08-16 NOTE — H&P (View-Only) (Signed)
" °  Case request for bronchoscopy was made  Scheduled for bronchoscopy at 1130 08/17/2024 for right upper lobe endobronchial lesion with adenopathy  "

## 2024-08-17 ENCOUNTER — Inpatient Hospital Stay (HOSPITAL_COMMUNITY): Admitting: Certified Registered Nurse Anesthetist

## 2024-08-17 ENCOUNTER — Encounter (HOSPITAL_COMMUNITY): Admission: EM | Disposition: A | Payer: Self-pay | Source: Home / Self Care | Attending: Family Medicine

## 2024-08-17 ENCOUNTER — Inpatient Hospital Stay (HOSPITAL_COMMUNITY)

## 2024-08-17 DIAGNOSIS — J189 Pneumonia, unspecified organism: Secondary | ICD-10-CM | POA: Diagnosis not present

## 2024-08-17 DIAGNOSIS — R590 Localized enlarged lymph nodes: Secondary | ICD-10-CM

## 2024-08-17 DIAGNOSIS — I10 Essential (primary) hypertension: Secondary | ICD-10-CM | POA: Diagnosis not present

## 2024-08-17 DIAGNOSIS — Z87891 Personal history of nicotine dependence: Secondary | ICD-10-CM | POA: Diagnosis not present

## 2024-08-17 DIAGNOSIS — T17508A Unspecified foreign body in bronchus causing other injury, initial encounter: Secondary | ICD-10-CM

## 2024-08-17 DIAGNOSIS — I2510 Atherosclerotic heart disease of native coronary artery without angina pectoris: Secondary | ICD-10-CM | POA: Diagnosis not present

## 2024-08-17 LAB — GLUCOSE, CAPILLARY
Glucose-Capillary: 132 mg/dL — ABNORMAL HIGH (ref 70–99)
Glucose-Capillary: 135 mg/dL — ABNORMAL HIGH (ref 70–99)
Glucose-Capillary: 160 mg/dL — ABNORMAL HIGH (ref 70–99)
Glucose-Capillary: 161 mg/dL — ABNORMAL HIGH (ref 70–99)
Glucose-Capillary: 213 mg/dL — ABNORMAL HIGH (ref 70–99)
Glucose-Capillary: 259 mg/dL — ABNORMAL HIGH (ref 70–99)
Glucose-Capillary: 313 mg/dL — ABNORMAL HIGH (ref 70–99)
Glucose-Capillary: 88 mg/dL (ref 70–99)

## 2024-08-17 MED ORDER — CHLORHEXIDINE GLUCONATE 0.12 % MT SOLN
15.0000 mL | Freq: Once | OROMUCOSAL | Status: AC
  Administered 2024-08-17: 15 mL via OROMUCOSAL

## 2024-08-17 MED ORDER — ONDANSETRON HCL 4 MG/2ML IJ SOLN
4.0000 mg | Freq: Four times a day (QID) | INTRAMUSCULAR | Status: AC | PRN
  Administered 2024-08-17 – 2024-08-19 (×3): 4 mg via INTRAVENOUS
  Filled 2024-08-17 (×3): qty 2

## 2024-08-17 MED ORDER — SUGAMMADEX SODIUM 200 MG/2ML IV SOLN
INTRAVENOUS | Status: DC | PRN
  Administered 2024-08-17: 200 mg via INTRAVENOUS

## 2024-08-17 MED ORDER — LORAZEPAM 0.5 MG PO TABS
0.5000 mg | ORAL_TABLET | Freq: Four times a day (QID) | ORAL | Status: DC | PRN
  Administered 2024-08-17 – 2024-08-21 (×11): 0.5 mg via ORAL
  Filled 2024-08-17 (×11): qty 1

## 2024-08-17 MED ORDER — PROPOFOL 10 MG/ML IV BOLUS
INTRAVENOUS | Status: DC | PRN
  Administered 2024-08-17: 150 mg via INTRAVENOUS

## 2024-08-17 MED ORDER — DEXAMETHASONE SODIUM PHOSPHATE 4 MG/ML IJ SOLN
INTRAMUSCULAR | Status: DC | PRN
  Administered 2024-08-17: 5 mg via INTRAVENOUS

## 2024-08-17 MED ORDER — SODIUM CHLORIDE 0.9 % IV SOLN
INTRAVENOUS | Status: AC | PRN
  Administered 2024-08-17: 500 mL via INTRAMUSCULAR

## 2024-08-17 MED ORDER — PHENYLEPHRINE 80 MCG/ML (10ML) SYRINGE FOR IV PUSH (FOR BLOOD PRESSURE SUPPORT)
PREFILLED_SYRINGE | INTRAVENOUS | Status: DC | PRN
  Administered 2024-08-17: 160 ug via INTRAVENOUS

## 2024-08-17 MED ORDER — ROCURONIUM BROMIDE 10 MG/ML (PF) SYRINGE
PREFILLED_SYRINGE | INTRAVENOUS | Status: DC | PRN
  Administered 2024-08-17: 40 mg via INTRAVENOUS

## 2024-08-17 MED ORDER — FENTANYL CITRATE (PF) 100 MCG/2ML IJ SOLN
INTRAMUSCULAR | Status: AC
  Filled 2024-08-17: qty 2

## 2024-08-17 MED ORDER — INSULIN GLARGINE-YFGN 100 UNIT/ML ~~LOC~~ SOLN
8.0000 [IU] | Freq: Every day | SUBCUTANEOUS | Status: DC
  Administered 2024-08-18 – 2024-08-20 (×3): 8 [IU] via SUBCUTANEOUS
  Filled 2024-08-17 (×4): qty 0.08

## 2024-08-17 MED ORDER — CHLORHEXIDINE GLUCONATE 0.12 % MT SOLN
OROMUCOSAL | Status: AC
  Filled 2024-08-17: qty 15

## 2024-08-17 MED ORDER — FENTANYL CITRATE (PF) 100 MCG/2ML IJ SOLN
INTRAMUSCULAR | Status: DC | PRN
  Administered 2024-08-17: 100 ug via INTRAVENOUS

## 2024-08-17 MED ORDER — ONDANSETRON HCL 4 MG/2ML IJ SOLN
INTRAMUSCULAR | Status: DC | PRN
  Administered 2024-08-17: 4 mg via INTRAVENOUS

## 2024-08-17 MED ORDER — HYDROMORPHONE HCL 1 MG/ML IJ SOLN
0.5000 mg | INTRAMUSCULAR | Status: DC | PRN
  Administered 2024-08-17 – 2024-08-20 (×13): 0.5 mg via INTRAVENOUS
  Filled 2024-08-17 (×12): qty 0.5

## 2024-08-17 NOTE — Anesthesia Procedure Notes (Signed)
 Procedure Name: Intubation Date/Time: 08/17/2024 12:37 PM  Performed by: Judythe Tanda Aran, CRNAPre-anesthesia Checklist: Patient identified, Emergency Drugs available, Suction available and Patient being monitored Patient Re-evaluated:Patient Re-evaluated prior to induction Oxygen  Delivery Method: Circle system utilized Preoxygenation: Pre-oxygenation with 100% oxygen  Induction Type: IV induction Ventilation: Mask ventilation without difficulty Laryngoscope Size: 2 and Miller Grade View: Grade I Tube type: Oral Tube size: 9.0 mm Number of attempts: 1 Airway Equipment and Method: Stylet Placement Confirmation: ETT inserted through vocal cords under direct vision, positive ETCO2 and breath sounds checked- equal and bilateral Secured at: 23 cm Tube secured with: Tape Dental Injury: Teeth and Oropharynx as per pre-operative assessment

## 2024-08-17 NOTE — Progress Notes (Signed)
 Patient off the floor , no CPT administered.

## 2024-08-17 NOTE — Anesthesia Postprocedure Evaluation (Signed)
"   Anesthesia Post Note  Patient: James [name]  Procedure(s) Performed: VIDEO BRONCHOSCOPY WITHOUT FLUORO (Bilateral) REMOVAL, FOREIGN BODY, BRONCHUS IRRIGATION, BRONCHUS BRONCHOSCOPY, WITH BIOPSY BRONCHOSCOPY, WITH BRUSH BIOPSY     Patient location during evaluation: PACU Anesthesia Type: General Level of consciousness: awake and alert Pain management: pain level controlled Vital Signs Assessment: post-procedure vital signs reviewed and stable Respiratory status: spontaneous breathing, nonlabored ventilation, respiratory function stable and patient connected to nasal cannula oxygen  Cardiovascular status: blood pressure returned to baseline and stable Postop Assessment: no apparent nausea or vomiting Anesthetic complications: no   No notable events documented.  Last Vitals:  Vitals:   08/17/24 1400 08/17/24 1410  BP: (!) 139/45 (!) 149/56  Pulse: (!) 108 (!) 101  Resp: (!) 27 (!) 30  Temp:    SpO2: 92% (!) 89%    Last Pain:  Vitals:   08/17/24 1354  TempSrc: Temporal  PainSc:                  [name]      "

## 2024-08-17 NOTE — Progress Notes (Signed)
" °   08/17/24 1602  TOC Brief Assessment  Insurance and Status Reviewed  Patient has primary care physician Yes Maryruth, Garnette, MD)  Home environment has been reviewed Home  Prior level of function: Independent  Prior/Current Home Services No current home services  Social Drivers of Health Review SDOH reviewed no interventions necessary  Readmission risk has been reviewed Yes  Transition of care needs no transition of care needs at this time    "

## 2024-08-17 NOTE — Transfer of Care (Signed)
 Immediate Anesthesia Transfer of Care Note  Patient: James [name]  Procedure(s) Performed: VIDEO BRONCHOSCOPY WITHOUT FLUORO (Bilateral) REMOVAL, FOREIGN BODY, BRONCHUS IRRIGATION, BRONCHUS BRONCHOSCOPY, WITH BIOPSY BRONCHOSCOPY, WITH BRUSH BIOPSY  Patient Location: Endoscopy Unit  Anesthesia Type:General  Level of Consciousness: awake  Airway & Oxygen  Therapy: Patient Spontanous Breathing and Patient connected to face mask  Post-op Assessment: Report given to RN and Post -op Vital signs reviewed and stable  Post vital signs: Reviewed and stable  Last Vitals:  Vitals Value Taken Time  BP    Temp    Pulse 113 08/17/24 13:55  Resp 38 08/17/24 13:55  SpO2 92 % 08/17/24 13:55  Vitals shown include unfiled device data.  Last Pain:  Vitals:   08/17/24 1150  TempSrc: Temporal  PainSc: 0-No pain         Complications: No notable events documented.

## 2024-08-17 NOTE — Op Note (Signed)
" °  Name:  James [name] MRN:  [id number] DOB:  09/04/1955  PROCEDURE NOTE  Procedure(s): Flexible bronchoscopy [phone]) Brushing (68376) of the RUL Bronchial alveolar lavage (68375) of the RUL  Endobronchial biopsy (68374) of the RUL Foreign body removal RUL  Indications:  RUL persistent consolidation, airwaynarrowing +mediastinal lymphadenopathy.  Consent:  Written informed consent was obtained prior to the procedure. The risks of the procedure including coughing, bleeding and the small chance of lung puncture requiring chest tube were discussed in great detail. The benefits & alternatives including serial follow up were also discussed.  Anesthesia:  General endotracheal.  Procedure summary:  Appropriate equipment was assembled.  The patient was  identified as [name]. Interim history obtained and brought to the endo room. Safety timeout was performed. The patient was placed supine on the operating table, airway established and general anesthesia administered by Anesthesia team.   After the appropriate level of anesthesia was assured, flexible video bronchoscope was lubricated and inserted through the endotracheal tube.    Airway examination was performed bilaterally to subsegmental level.  Copious purulent secretions were noted, which were suctioned. RUL bronchus was obstructed & foreign body noted to have dropped into br intermedius  Using a dornia basket, FB ws removed & identified as a pea. RUL mass was then biopsied & seemed to come out as food debris. Basket was again used to take this out in chunks over the next 30 mins with frequent lavage . Brushings also obtained from RUL & endobronchial biopsies . At the end, some part of RUL bronchus could be visualised  Flexible video bronchoscope was used again to perform random endobronchial mucosal biopsies.  After ensuring hemostasis , the bronchoscope was withdrawn.  The patient was extubated  and transferred to recovery. Post-procedure  chest x-ray was ordered.  Specimens sent: Bronchial alveolar lavage specimen of the RUL for  microbiology and cytology. Brushings for cytology Biopsy for path FB for path  Complications:  No immediate complications were noted.  Hemodynamic parameters and oxygenation remained stable throughout the procedure.  Estimated blood loss:  Less then 5 mL.        Harden Staff MD. [facility] Pager [phone] If no response call [phone]   08/17/2024 1:52 PM    "

## 2024-08-17 NOTE — Anesthesia Preprocedure Evaluation (Addendum)
"                                    Anesthesia Evaluation  Patient identified by MRN, date of birth, ID band Patient awake    Reviewed: Allergy & Precautions, H&P , NPO status , Patient's Chart, lab work & pertinent test results  Airway Mallampati: II   Neck ROM: full    Dental  (+) Poor Dentition, Chipped, Missing   Pulmonary asthma , former smoker   breath sounds clear to auscultation       Cardiovascular hypertension, + CAD and + Peripheral Vascular Disease   Rhythm:regular Rate:Normal  Non-obstructive CAD, medically managed.  Normal EF.  Echocardiogram 03/09/2022:  Normal LV systolic function with visual EF 55-60%. Left ventricle cavity  is normal in size. Normal left ventricular wall thickness. Normal global  wall motion. Doppler evidence of grade I (impaired) diastolic dysfunction,  normal LAP. Calculated EF 57%.  Structurally normal tricuspid valve.  Mild tricuspid regurgitation. No  evidence of pulmonary hypertension.  No significant change compared to prior.     Neuro/Psych Seizures -,  PSYCHIATRIC DISORDERS Anxiety Depression       GI/Hepatic ,GERD  ,,  Endo/Other  diabetes, Type 2Hypothyroidism    Renal/GU      Musculoskeletal   Abdominal   Peds  Hematology  (+) Blood dyscrasia, anemia   Anesthesia Other Findings   Reproductive/Obstetrics                              Anesthesia Physical Anesthesia Plan  ASA: 3  Anesthesia Plan: General   Post-op Pain Management: Minimal or no pain anticipated   Induction: Intravenous  PONV Risk Score and Plan: 2 and Ondansetron , Dexamethasone  and Treatment may vary due to age or medical condition  Airway Management Planned: Oral ETT  Additional Equipment: None  Intra-op Plan:   Post-operative Plan: Extubation in OR  Informed Consent: I have reviewed the patients History and Physical, chart, labs and discussed the procedure including the risks, benefits and  alternatives for the proposed anesthesia with the patient or authorized representative who has indicated his/her understanding and acceptance.     Dental advisory given  Plan Discussed with: CRNA, Anesthesiologist and Surgeon  Anesthesia Plan Comments: (  )         Anesthesia Quick Evaluation  "

## 2024-08-17 NOTE — Progress Notes (Signed)
 PT Cancellation Note  Patient Details Name: James [name] MRN: [id number] DOB: [birth year]   Cancelled Treatment:    Reason Eval/Treat Not Completed: Other (comment). Chart review completed, noted bronchoscopy scheduled for today 11:30. Will continue to follow for PT eval as schedule permits.   Tori Cyan Clippinger PT, DPT 08/17/24, 9:15 AM

## 2024-08-17 NOTE — Interval H&P Note (Signed)
 History and Physical Interval Note:  08/17/2024 12:22 PM  James [name]  has presented today for surgery, with the diagnosis of right upper lobe mass.  The various methods of treatment have been discussed with the patient and family. After consideration of risks, benefits and other options for treatment, the patient has consented to  Procedures: VIDEO BRONCHOSCOPY WITHOUT FLUORO (Bilateral) ENDOBRONCHIAL ULTRASOUND (EBUS) (Right) as a surgical intervention.  The patient's history has been reviewed, patient examined, no change in status, stable for surgery.  I have reviewed the patient's chart and labs.  Questions were answered to the patient's satisfaction.     Harden ROCKFORD Toinette Lackie

## 2024-08-17 NOTE — Plan of Care (Signed)
  Problem: Coping: Goal: Ability to adjust to condition or change in health will improve Outcome: Progressing   Problem: Skin Integrity: Goal: Risk for impaired skin integrity will decrease Outcome: Progressing   Problem: Tissue Perfusion: Goal: Adequacy of tissue perfusion will improve Outcome: Progressing

## 2024-08-18 ENCOUNTER — Encounter (HOSPITAL_COMMUNITY): Payer: Self-pay | Admitting: Pulmonary Disease

## 2024-08-18 DIAGNOSIS — J189 Pneumonia, unspecified organism: Secondary | ICD-10-CM | POA: Diagnosis not present

## 2024-08-18 LAB — BASIC METABOLIC PANEL WITH GFR
Anion gap: 9 (ref 5–15)
BUN: 15 mg/dL (ref 8–23)
CO2: 27 mmol/L (ref 22–32)
Calcium: 8.9 mg/dL (ref 8.9–10.3)
Chloride: 98 mmol/L (ref 98–111)
Creatinine, Ser: 0.72 mg/dL (ref 0.61–1.24)
GFR, Estimated: >60 mL/min
Glucose, Bld: 345 mg/dL — ABNORMAL HIGH (ref 70–99)
Potassium: 4.8 mmol/L (ref 3.5–5.1)
Sodium: 134 mmol/L — ABNORMAL LOW (ref 135–145)

## 2024-08-18 LAB — CBC WITH DIFFERENTIAL/PLATELET
Abs Immature Granulocytes: 0.07 10*3/uL (ref 0.00–0.07)
Basophils Absolute: 0 10*3/uL (ref 0.0–0.1)
Basophils Relative: 0 %
Eosinophils Absolute: 0 10*3/uL (ref 0.0–0.5)
Eosinophils Relative: 0 %
HCT: 29.1 % — ABNORMAL LOW (ref 39.0–52.0)
Hemoglobin: 9.7 g/dL — ABNORMAL LOW (ref 13.0–17.0)
Immature Granulocytes: 1 %
Lymphocytes Relative: 6 %
Lymphs Abs: 0.8 10*3/uL (ref 0.7–4.0)
MCH: 29.6 pg (ref 26.0–34.0)
MCHC: 33.3 g/dL (ref 30.0–36.0)
MCV: 88.7 fL (ref 80.0–100.0)
Monocytes Absolute: 0.6 10*3/uL (ref 0.1–1.0)
Monocytes Relative: 5 %
Neutro Abs: 12 10*3/uL — ABNORMAL HIGH (ref 1.7–7.7)
Neutrophils Relative %: 88 %
Platelets: 384 10*3/uL (ref 150–400)
RBC: 3.28 MIL/uL — ABNORMAL LOW (ref 4.22–5.81)
RDW: 12.5 % (ref 11.5–15.5)
WBC: 13.6 10*3/uL — ABNORMAL HIGH (ref 4.0–10.5)
nRBC: 0 % (ref 0.0–0.2)

## 2024-08-18 LAB — GLUCOSE, CAPILLARY
Glucose-Capillary: 326 mg/dL — ABNORMAL HIGH (ref 70–99)
Glucose-Capillary: 185 mg/dL — ABNORMAL HIGH (ref 70–99)
Glucose-Capillary: 214 mg/dL — ABNORMAL HIGH (ref 70–99)
Glucose-Capillary: 194 mg/dL — ABNORMAL HIGH (ref 70–99)

## 2024-08-18 MED ORDER — METHYLPREDNISOLONE SODIUM SUCC 40 MG IJ SOLR
40.0000 mg | Freq: Once | INTRAMUSCULAR | Status: AC
  Administered 2024-08-18: 40 mg via INTRAVENOUS
  Filled 2024-08-18: qty 1

## 2024-08-18 NOTE — Plan of Care (Signed)
" °  Problem: Fluid Volume: Goal: Ability to maintain a balanced intake and output will improve Outcome: Progressing   Problem: Metabolic: Goal: Ability to maintain appropriate glucose levels will improve Outcome: Progressing   Problem: Nutritional: Goal: Maintenance of adequate nutrition will improve Outcome: Progressing   Problem: Clinical Measurements: Goal: Ability to maintain clinical measurements within normal limits will improve Outcome: Progressing   Problem: Activity: Goal: Risk for activity intolerance will decrease Outcome: Progressing   Problem: Elimination: Goal: Will not experience complications related to bowel motility Outcome: Progressing Goal: Will not experience complications related to urinary retention Outcome: Progressing   Problem: Pain Managment: Goal: General experience of comfort will improve and/or be controlled Outcome: Progressing   Problem: Safety: Goal: Ability to remain free from injury will improve Outcome: Progressing   "

## 2024-08-18 NOTE — Plan of Care (Signed)

## 2024-08-18 NOTE — Evaluation (Signed)
 Physical Therapy Brief Evaluation and Discharge Note Patient Details Name: James [name] MRN: [id number] DOB: [birth year] Today's Date: 08/18/2024   History of Present Illness  69 yo with increased coughing and shortness of breath. Found to have un resolving pneumonia with complete occlusion of R upper bronchus, underwent bronchoscopy awaiting BAL cultures.PMH: COPD, asthma, DM. And this all began to worsen back in September per chart and pt report.  3 L O2 current home baseline  Clinical Impression  Pt very pleasant and receptive to try mobilizing. Pt has general weakness he is aware of due to limited mobility in past few weeks due to lung capacity. Pt was able to walk in hallway, but with very short distance really struggled with breathing and dyspnea 3-4/4 by the end. Worked on educating him about little intervals of mobility, seated rest breaks and pursed lip breathing and the concept behind this. This helped his O2 sats return to normal and breathing calm about about 2 minutes.   Educated to work large muscle groups even with static holds for knee extension 5 times each side, and over head arm movement for 10 seconds intervals. This program will continue with mobility specialist while here at hospital.   Would pt be candidate for cardia/ pulmonary rehab once home?   PT to sign off at this time. Continue mobility with nursing and mobility team.      PT Assessment All further PT needs can be met in the next venue of care (will have mobility team see pt while here for mobility and exercises)  Assistance Needed at Discharge  PRN    Equipment Recommendations None recommended by PT (possibly reassess for rollator? for rest breaks?)  Recommendations for Other Services       Precautions/Restrictions Precautions Precautions: None Restrictions Weight Bearing Restrictions Per Provider Order: No        Mobility  Bed Mobility Rolling: Modified independent (Device/Increase  time) Supine/Sidelying to sit: Modified independent (Device/Increased time) Sit to supine/sidelying: Contact guard assist    Transfers Overall transfer level: Needs assistance Equipment used: 1 person hand held assist Transfers: Sit to/from Stand Sit to Stand: Contact guard assist                Ambulation/Gait Ambulation/Gait assistance: Contact guard assist Gait Distance (Feet): 70 Feet Assistive device: 1 person hand held assist   Gait Speed: Below normal (slow) General Gait Details: hand held assist to stedy, assistnce with O2 tank and education with persed lip breathing for rest breaks.  Home Activity Instructions    Stairs            Modified Rankin (Stroke Patients Only)        Balance Overall balance assessment: Modified Independent, No apparent balance deficits (not formally assessed)                        Pertinent Vitals/Pain PT - Brief Vital Signs All Vital Signs Stable: Yes (on 3 L O2 at 96%, then with ambulation down to 90% and seated recover within 1 minute to 95% . Dyspnea 3/4- 4/4 after very short walk .) Pain Assessment Pain Assessment: No/denies pain     Home Living Family/patient expects to be discharged to:: Private residence Living Arrangements: Spouse/significant other Available Help at Discharge: Family Home Environment: Level entry   Home Equipment: None   Additional Comments: 2-3 L O2 at baseline since September admission    Prior Function Level of Independence: Independent Comments: limited recently  due to breathing capacity and getting weakner wtih limited ability to move about .    UE/LE Assessment   UE ROM/Strength/Tone/Coordination: WFL    LE ROM/Strength/Tone/Coordination: [facility]      Communication   Communication Communication: No apparent difficulties     Cognition Overall Cognitive Status: Appears within functional limits for tasks assessed/performed       General Comments      Exercises      Assessment/Plan    PT Problem List Decreased strength;Decreased activity tolerance       PT Visit Diagnosis Other abnormalities of gait and mobility (R26.89);Muscle weakness (generalized) (M62.81)    No Skilled PT     Co-evaluation                AMPAC 6 Clicks Help needed turning from your back to your side while in a flat bed without using bedrails?: None Help needed moving from lying on your back to sitting on the side of a flat bed without using bedrails?: None Help needed moving to and from a bed to a chair (including a wheelchair)?: None Help needed standing up from a chair using your arms (e.g., wheelchair or bedside chair)?: A Little Help needed to walk in hospital room?: A Little Help needed climbing 3-5 steps with a railing? : A Little 6 Click Score: 21      End of Session   Activity Tolerance: Treatment limited secondary to medical complications (Comment);Other (comment) (limited by lung breathing capacity) Patient left: in bed;with call bell/phone within reach;with bed alarm set Nurse Communication: Mobility status PT Visit Diagnosis: Other abnormalities of gait and mobility (R26.89);Muscle weakness (generalized) (M62.81)     Time: 8963-8894 PT Time Calculation (min) (ACUTE ONLY): 29 min  Charges:   PT Evaluation $PT Eval Low Complexity: 1 Low PT Treatments $Gait Training: 8-22 mins   James [name], PT, MPT Acute Rehabilitation Services Office: [phone] If a weekend: secure chat groups: [facility] PT, [facility] OT, [facility] SLP 08/18/2024   James [name]  08/18/2024, 1:44 PM

## 2024-08-18 NOTE — Progress Notes (Signed)
 SLP Cancellation Note  Patient Details Name: Favor Hackler Estell MRN: [id number] DOB: Mar 31, 1956   Cancelled treatment:       Reason Eval/Treat Not Completed: Other (comment)  Patient politely declined swallowing exam including potential MBS. He was most recently seen during admission earlier this month. At that time, no big changes were noted in his swallowing function at bedside by SLP, confirmed by patient and at that time he also declined MBS but was open to OP SLP services which he states he still is. Did explain that OP SLP may wish for MBS to be complete in order to most effectively guide treatment and patient verbalized understanding stating that he would consider it. He verbalizes continued use of safe swallowing precautions. Told patient that at any time during his stay he can request that SLP re-visit for eval as he wishes.   Rea Pass MA, CCC-SLP    Reuven Braver Meryl 08/18/2024, 4:21 PM

## 2024-08-19 DIAGNOSIS — J189 Pneumonia, unspecified organism: Secondary | ICD-10-CM | POA: Diagnosis not present

## 2024-08-19 LAB — CBC
HCT: 30.7 % — ABNORMAL LOW (ref 39.0–52.0)
Hemoglobin: 9.8 g/dL — ABNORMAL LOW (ref 13.0–17.0)
MCH: 28.9 pg (ref 26.0–34.0)
MCHC: 31.9 g/dL (ref 30.0–36.0)
MCV: 90.6 fL (ref 80.0–100.0)
Platelets: 371 10*3/uL (ref 150–400)
RBC: 3.39 MIL/uL — ABNORMAL LOW (ref 4.22–5.81)
RDW: 12.6 % (ref 11.5–15.5)
WBC: 7.6 10*3/uL (ref 4.0–10.5)
nRBC: 0 % (ref 0.0–0.2)

## 2024-08-19 LAB — GLUCOSE, CAPILLARY
Glucose-Capillary: 320 mg/dL — ABNORMAL HIGH (ref 70–99)
Glucose-Capillary: 314 mg/dL — ABNORMAL HIGH (ref 70–99)
Glucose-Capillary: 305 mg/dL — ABNORMAL HIGH (ref 70–99)
Glucose-Capillary: 317 mg/dL — ABNORMAL HIGH (ref 70–99)
Glucose-Capillary: 213 mg/dL — ABNORMAL HIGH (ref 70–99)

## 2024-08-19 NOTE — Plan of Care (Signed)
  Problem: Education: Goal: Ability to describe self-care measures that may prevent or decrease complications (Diabetes Survival Skills Education) will improve Outcome: Progressing Goal: Individualized Educational Video(s) Outcome: Progressing   Problem: Coping: Goal: Ability to adjust to condition or change in health will improve Outcome: Progressing   Problem: Fluid Volume: Goal: Ability to maintain a balanced intake and output will improve Outcome: Progressing   Problem: Health Behavior/Discharge Planning: Goal: Ability to identify and utilize available resources and services will improve Outcome: Progressing Goal: Ability to manage health-related needs will improve Outcome: Progressing   Problem: Metabolic: Goal: Ability to maintain appropriate glucose levels will improve Outcome: Progressing   Problem: Nutritional: Goal: Maintenance of adequate nutrition will improve Outcome: Progressing Goal: Progress toward achieving an optimal weight will improve Outcome: Progressing   Problem: Skin Integrity: Goal: Risk for impaired skin integrity will decrease Outcome: Progressing   Problem: Tissue Perfusion: Goal: Adequacy of tissue perfusion will improve Outcome: Progressing   Problem: Activity: Goal: Ability to tolerate increased activity will improve Outcome: Progressing   Problem: Clinical Measurements: Goal: Ability to maintain a body temperature in the normal range will improve Outcome: Progressing   Problem: Respiratory: Goal: Ability to maintain adequate ventilation will improve Outcome: Progressing Goal: Ability to maintain a clear airway will improve Outcome: Progressing   Problem: Education: Goal: Knowledge of General Education information will improve Description: Including pain rating scale, medication(s)/side effects and non-pharmacologic comfort measures Outcome: Progressing   Problem: Health Behavior/Discharge Planning: Goal: Ability to manage  health-related needs will improve Outcome: Progressing   Problem: Clinical Measurements: Goal: Ability to maintain clinical measurements within normal limits will improve Outcome: Progressing Goal: Will remain free from infection Outcome: Progressing Goal: Diagnostic test results will improve Outcome: Progressing Goal: Respiratory complications will improve Outcome: Progressing Goal: Cardiovascular complication will be avoided Outcome: Progressing   Problem: Activity: Goal: Risk for activity intolerance will decrease Outcome: Progressing   Problem: Nutrition: Goal: Adequate nutrition will be maintained Outcome: Progressing   Problem: Coping: Goal: Level of anxiety will decrease Outcome: Progressing   Problem: Elimination: Goal: Will not experience complications related to bowel motility Outcome: Progressing Goal: Will not experience complications related to urinary retention Outcome: Progressing   Problem: Pain Managment: Goal: General experience of comfort will improve and/or be controlled Outcome: Progressing   Problem: Safety: Goal: Ability to remain free from injury will improve Outcome: Progressing   Problem: Skin Integrity: Goal: Risk for impaired skin integrity will decrease Outcome: Progressing

## 2024-08-19 NOTE — Plan of Care (Signed)
  Problem: Pain Managment: Goal: General experience of comfort will improve and/or be controlled Outcome: Progressing   Problem: Safety: Goal: Ability to remain free from injury will improve Outcome: Progressing   Problem: Skin Integrity: Goal: Risk for impaired skin integrity will decrease Outcome: Progressing

## 2024-08-19 NOTE — Progress Notes (Signed)
 " PROGRESS NOTE    James [name]  FMW:[id number] DOB: 1955/08/29 DOA: 08/15/2024 PCP: Nichole Senior, MD   Brief Narrative:  This 69 years old male with PMH significant for anxiety, asthma, COPD,  depression, diabetes mellitus, hyperlipidemia, hypothyroidism, seizures presented to hospital with acute shortness of breath.  Found to have recurrent pneumonia. He was hospitalized in September and again in January 6th for same problem ( Recurrent pneumonia )   Unfortunately he is having recurrent symptoms.  Patient admitted for recurrent pneumonia with CT findings of complete occlusion of the right upper lobe bronchus with significant progression of the right upper lobe consolidation. Significant labs WBC 11.3, lactic acid 1.8, influenza, COVID-19 and RSV negative on admission. He was admitted for further evaluation. Pulmonology was consulted. On 3 L/min supplemental oxygen . He underwent bronchoscopy on 1/30. PCCM following   Assessment & Plan:   Principal Problem:   Recurrent pneumonia Active Problems:   Asthma, chronic, unspecified asthma severity, with acute exacerbation   HLD (hyperlipidemia)   DM type 1 (diabetes mellitus, type 1) (HCC)   Long term (current) use of insulin  (HCC)   Pneumonia   FB bronchus  Acute on chronic hypoxic respiratory failure: Recurrent / Un-resolving pneumonia: Patient presenting with progressive shortness of breath and dyspnea.  No obvious respiratory distress.   He required up to 5 L/ M of supplemental oxygen .  CTA chest on 1/28 showed no evidence of pulmonary embolism but showed complete occlusion of right upper lobe bronchus with significant progression of the right upper lobe consolidation since the prior CT.  Findings may represent worsening postobstructive pneumonia or malignancy.  Bronchoscopy may provide better evaluation.  -Pulmonology on board, bronchoscopy completed on  08/17/24.   -Copious purulent secretions were suctioned. RUL bronchus was obstructed  with foreign body. Foreign body removed. -Continue with IV cefepime .  Follow-up on cultures, -Continue Supportive care -SLP eval. -He is still requiring 4 to 5 L of supplemental oxygen  and sats 96%. -PCCM wanted to repeat chest x-ray tomorrow to see if consolidation is improving.   History of asthma / COPD: Continue bronchodilators and steroids nebulizer.  Continue supplemental oxygen  and wean as tolerated.   Diabetes mellitus type 1 with hyperglycemia: - Monitor blood sugars very closely.   - Decrease Lantus  to 8 units nightly due to low blood sugars.   - Historically patient reports brittle sugars.   Hyperlipidemia: Continue Simvastatin  40 mg daily.   Hypothyroidism: Continue Synthroid  100 mcg daily.   BPH: Continue finasteride  and tamsulosin .   Anxiety: Continue  Ativan  0.5 q8hr as needed    DVT prophylaxis: Lovenox  Code Status: Full code Family Communication: No family at bed side. Disposition Plan:     Status is: Inpatient Remains inpatient appropriate because: Patient admitted for recurrent ,  unresolving pneumonia found to have complete occlusion of the right left upper bronchus,  underwent bronchoscopy.  Is getting antibiotics,  awaiting BAL cultures.   Patient not medically ready for discharge.  Consultants:  Pulmonology  Procedures: Bronchoscopy  Antimicrobials: Anti-infectives (From admission, onward)    Start     Dose/Rate Route Frequency Ordered Stop   08/16/24 1400  ceFEPIme  (MAXIPIME ) 2 g in sodium chloride  0.9 % 100 mL IVPB        2 g 200 mL/hr over 30 Minutes Intravenous Every 8 hours 08/16/24 1246     08/16/24 0400  vancomycin  (VANCOREADY) IVPB 750 mg/150 mL  Status:  Discontinued        750 mg 150 mL/hr over  60 Minutes Intravenous Every 12 hours 08/15/24 2204 08/16/24 1246   08/15/24 2359  Ampicillin -Sulbactam (UNASYN ) 3 g in sodium chloride  0.9 % 100 mL IVPB  Status:  Discontinued        3 g 200 mL/hr over 30 Minutes Intravenous Every 6  hours 08/15/24 2156 08/16/24 1246   08/15/24 1615  vancomycin  (VANCOCIN ) IVPB 1000 mg/200 mL premix        1,000 mg 200 mL/hr over 60 Minutes Intravenous  Once 08/15/24 1609 08/15/24 1717   08/15/24 1615  ceFEPIme  (MAXIPIME ) 2 g in sodium chloride  0.9 % 100 mL IVPB        2 g 200 mL/hr over 30 Minutes Intravenous  Once 08/15/24 1609 08/15/24 1645      Subjective: Patient was seen and examined at bedside.  Overnight events noted. Patient reports feeling comfortable after having bronchoscopy.  Still has a lot of coughing. He still requires 4 L of supplemental oxygen ,  denies any chest pain.  Objective: Vitals:   08/18/24 1609 08/18/24 1939 08/19/24 0549 08/19/24 0842  BP:   (P) 135/71   Pulse:   (P) 80   Resp:    19  Temp:   (P) 98 F (36.7 C)   TempSrc:   (P) Oral   SpO2: 99% 99% (P) 99% 91%  Weight:      Height:        Intake/Output Summary (Last 24 hours) at 08/19/2024 1146 Last data filed at 08/19/2024 0923 Gross per 24 hour  Intake 41.17 ml  Output --  Net 41.17 ml   Filed Weights   08/17/24 1029 08/17/24 1150  Weight: 66 kg 66 kg    Examination:  General exam: Appears calm and comfortable, not in any acute distress. Respiratory system: CTA Bilaterally. Respiratory effort normal.  RR 14 Cardiovascular system: S1 & S2 heard, RRR. No JVD, murmurs, rubs, gallops or clicks.  Gastrointestinal system: Abdomen is non distended, soft and non tender. Normal bowel sounds heard. Central nervous system: Alert and oriented x 3. No focal neurological deficits. Extremities: No edema, no cyanosis, no clubbing. Skin: No rashes, lesions or ulcers Psychiatry: Judgement and insight appear normal. Mood & affect appropriate.   Data Reviewed: I have personally reviewed following labs and imaging studies  CBC: Recent Labs  Lab 08/15/24 1413 08/16/24 0402 08/18/24 0721 08/19/24 0841  WBC 11.3* 8.3 13.6* 7.6  NEUTROABS  --   --  12.0*  --   HGB 10.7* 9.2* 9.7* 9.8*  HCT 33.9*  29.0* 29.1* 30.7*  MCV 93.1 92.4 88.7 90.6  PLT 422* 376 384 371   Basic Metabolic Panel: Recent Labs  Lab 08/15/24 1413 08/16/24 0402 08/18/24 0721  NA 137 135 134*  K 4.0 4.4 4.8  CL 100 102 98  CO2 23 24 27   GLUCOSE 189* 324* 345*  BUN 14 15 15   CREATININE 0.74 0.69 0.72  CALCIUM 9.7 9.2 8.9  MG  --  2.0  --    GFR: Estimated Creatinine Clearance: 81.4 mL/min (by C-G formula based on SCr of 0.72 mg/dL). Liver Function Tests: No results for input(s): AST, ALT, ALKPHOS, BILITOT, PROT, ALBUMIN  in the last 168 hours. No results for input(s): LIPASE, AMYLASE in the last 168 hours. No results for input(s): AMMONIA in the last 168 hours. Coagulation Profile: No results for input(s): INR, PROTIME in the last 168 hours. Cardiac Enzymes: No results for input(s): CKTOTAL, CKMB, CKMBINDEX, TROPONINI in the last 168 hours. BNP (last 3 results) Recent Labs  07/18/24 2030  PROBNP 190.0   HbA1C: No results for input(s): HGBA1C in the last 72 hours.  CBG: Recent Labs  Lab 08/18/24 1632 08/18/24 2101 08/19/24 0751 08/19/24 0830 08/19/24 1135  GLUCAP 214* 194* 320* 314* 305*   Lipid Profile: No results for input(s): CHOL, HDL, LDLCALC, TRIG, CHOLHDL, LDLDIRECT in the last 72 hours. Thyroid  Function Tests: No results for input(s): TSH, T4TOTAL, FREET4, T3FREE, THYROIDAB in the last 72 hours. Anemia Panel: No results for input(s): VITAMINB12, FOLATE, FERRITIN, TIBC, IRON, RETICCTPCT in the last 72 hours. Sepsis Labs: Recent Labs  Lab 08/15/24 1557  LATICACIDVEN 1.8    Recent Results (from the past 240 hours)  Resp panel by RT-PCR (RSV, Flu A&B, Covid) Anterior Nasal Swab     Status: None   Collection Time: 08/15/24  2:13 PM   Specimen: Anterior Nasal Swab  Result Value Ref Range Status   SARS Coronavirus 2 by RT PCR NEGATIVE NEGATIVE Final    Comment: (NOTE) SARS-CoV-2 target nucleic acids are NOT  DETECTED.  The SARS-CoV-2 RNA is generally detectable in upper respiratory specimens during the acute phase of infection. The lowest concentration of SARS-CoV-2 viral copies this assay can detect is 138 copies/mL. A negative result does not preclude SARS-Cov-2 infection and should not be used as the sole basis for treatment or other patient management decisions. A negative result may occur with  improper specimen collection/handling, submission of specimen other than nasopharyngeal swab, presence of viral mutation(s) within the areas targeted by this assay, and inadequate number of viral copies(<138 copies/mL). A negative result must be combined with clinical observations, patient history, and epidemiological information. The expected result is Negative.  Fact Sheet for Patients:  bloggercourse.com  Fact Sheet for Healthcare Providers:  seriousbroker.it  This test is no t yet approved or cleared by the [place]  FDA and  has been authorized for detection and/or diagnosis of SARS-CoV-2 by FDA under an Emergency Use Authorization (EUA). This EUA will remain  in effect (meaning this test can be used) for the duration of the COVID-19 declaration under Section 564(b)(1) of the Act, 21 U.S.C.section 360bbb-3(b)(1), unless the authorization is terminated  or revoked sooner.       Influenza A by PCR NEGATIVE NEGATIVE Final   Influenza B by PCR NEGATIVE NEGATIVE Final    Comment: (NOTE) The Xpert Xpress SARS-CoV-2/FLU/RSV plus assay is intended as an aid in the diagnosis of influenza from Nasopharyngeal swab specimens and should not be used as a sole basis for treatment. Nasal washings and aspirates are unacceptable for Xpert Xpress SARS-CoV-2/FLU/RSV testing.  Fact Sheet for Patients: bloggercourse.com  Fact Sheet for Healthcare Providers: seriousbroker.it  This test is not yet  approved or cleared by the [place]  FDA and has been authorized for detection and/or diagnosis of SARS-CoV-2 by FDA under an Emergency Use Authorization (EUA). This EUA will remain in effect (meaning this test can be used) for the duration of the COVID-19 declaration under Section 564(b)(1) of the Act, 21 U.S.C. section 360bbb-3(b)(1), unless the authorization is terminated or revoked.     Resp Syncytial Virus by PCR NEGATIVE NEGATIVE Final    Comment: (NOTE) Fact Sheet for Patients: bloggercourse.com  Fact Sheet for Healthcare Providers: seriousbroker.it  This test is not yet approved or cleared by the [place]  FDA and has been authorized for detection and/or diagnosis of SARS-CoV-2 by FDA under an Emergency Use Authorization (EUA). This EUA will remain in effect (meaning this test can be used) for the duration  of the COVID-19 declaration under Section 564(b)(1) of the Act, 21 U.S.C. section 360bbb-3(b)(1), unless the authorization is terminated or revoked.  Performed at [facility], [address], [place], [place] [address]   Blood culture (routine x 2)     Status: None (Preliminary result)   Collection Time: 08/15/24  3:50 PM   Specimen: BLOOD  Result Value Ref Range Status   Specimen Description   Final    BLOOD LEFT ANTECUBITAL Performed at [facility], [address], [place], [place] [address]    Special Requests   Final    BOTTLES DRAWN AEROBIC AND ANAEROBIC Blood Culture adequate volume Performed at [facility], [address] [place], [place] [address]    Culture   Final    NO GROWTH 4 DAYS Performed at [facility], [address] [place], [place] [address]    Report Status PENDING  Incomplete  Blood culture (routine x 2)     Status: None (Preliminary result)   Collection Time: 08/16/24  4:02 AM   Specimen: BLOOD RIGHT ARM  Result  Value Ref Range Status   Specimen Description   Final    BLOOD RIGHT ARM Performed at [facility], [address], [place], [place] [address]    Special Requests   Final    BOTTLES DRAWN AEROBIC AND ANAEROBIC Blood Culture adequate volume Performed at [facility], [address] [place], [place] [address]    Culture   Final    NO GROWTH 3 DAYS Performed at [facility], [address], [place], [place] [address]    Report Status PENDING  Incomplete  MRSA Next Gen by PCR, Nasal     Status: None   Collection Time: 08/16/24  5:21 PM   Specimen: Nasal Mucosa; Nasal Swab  Result Value Ref Range Status   MRSA by PCR Next Gen NOT DETECTED NOT DETECTED Final    Comment: (NOTE) The GeneXpert MRSA Assay (FDA approved for NASAL specimens only), is one component of a comprehensive MRSA colonization surveillance program. It is not intended to diagnose MRSA infection nor to guide or monitor treatment for MRSA infections. Test performance is not FDA approved in patients less than 19 years old. Performed at [facility], [address] [place], [place] [address]   Culture, BAL-quantitative w Gram Stain     Status: None (Preliminary result)   Collection Time: 08/17/24  1:01 PM   Specimen: Bronchial Alveolar Lavage; Respiratory  Result Value Ref Range Status   Specimen Description   Final    BRONCHIAL ALVEOLAR LAVAGE Performed at [facility], [address], [place], [place] [address]    Special Requests   Final    NONE Performed at [facility], [address], [place], [place] [address]    Gram Stain   Final    MODERATE WBC PRESENT, PREDOMINANTLY PMN NO ORGANISMS SEEN    Culture   Final    CULTURE REINCUBATED FOR BETTER GROWTH Performed at [facility], [address], [place], [place] [address]    Report Status PENDING  Incomplete    Radiology Studies: DG Chest Port 1 View Result  Date: 08/17/2024 CLINICAL DATA:  Status post bronchoscopy and biopsy EXAM: PORTABLE CHEST 1 VIEW COMPARISON:  Chest radiograph dated 08/15/2024, CTA chest dated 08/15/2024 FINDINGS: Normal lung volumes. Persistent right upper lobe consolidation, increased in density compared to 08/15/2024. No  pleural effusion or pneumothorax. The heart size and mediastinal contours are within normal limits. Cervical spinal fixation hardware appears intact. IMPRESSION: Persistent right upper lobe consolidation, increased in density compared to 08/15/2024. No pneumothorax. Electronically Signed   By: Limin  Xu M.D.   On: 08/17/2024 15:25   Scheduled Meds:  budesonide  (PULMICORT ) nebulizer solution  0.25 mg Nebulization BID   feeding supplement  237 mL Oral BID BM   finasteride   5 mg Oral QHS   insulin  aspart  0-15 Units Subcutaneous TID WC   insulin  aspart  0-5 Units Subcutaneous QHS   insulin  glargine-yfgn  8 Units Subcutaneous Daily   levothyroxine   100 mcg Oral QHS   loratadine   10 mg Oral Daily   metoCLOPramide   5 mg Oral BID AC   pantoprazole   40 mg Oral QHS   polyethylene glycol  17 g Oral BID   saccharomyces boulardii  250 mg Oral BID   senna-docusate  3 tablet Oral QHS   simvastatin   40 mg Oral QHS   sodium chloride  flush  3 mL Intravenous Q12H   tamsulosin   0.4 mg Oral QHS   Continuous Infusions:  ceFEPime  (MAXIPIME ) IV 2 g (08/19/24 0548)     LOS: 4 days    Time spent: 35 mins    Darcel Dawley, MD [facility]   If 7PM-7AM, please contact night-coverage  "

## 2024-08-19 NOTE — Progress Notes (Signed)
 Mobility Specialist - Progress Note:  [place] 3[place] During mobility: 95% SPO2, ~103 bpm HR  08/19/24 1621  Mobility  Activity Dangled on edge of bed (Bed Exercises)  Range of Motion/Exercises Active  Activity Response Tolerated well  Mobility Referral Yes  Mobility visit 1 Mobility  Mobility Specialist Start Time (ACUTE ONLY) 1307  Mobility Specialist Stop Time (ACUTE ONLY) 1317  Mobility Specialist Time Calculation (min) (ACUTE ONLY) 10 min   Pt was received in bed and agreed to bed exercises: Seated BLE Exercises: 10 reps each  1) Ankle Pumps  2) Knee Extension  3) Marching    4) Hip Adduction (pillow squeezes)  Pt had no complaints/issues at the end of session. Returned to bed with all needs met. Call bell in reach.  [employer] - Mobility Specialist - Acute Rehabilitation Can be reached via [employer]

## 2024-08-20 ENCOUNTER — Inpatient Hospital Stay (HOSPITAL_COMMUNITY)

## 2024-08-20 ENCOUNTER — Encounter (HOSPITAL_BASED_OUTPATIENT_CLINIC_OR_DEPARTMENT_OTHER): Payer: Self-pay | Admitting: Pulmonary Disease

## 2024-08-20 DIAGNOSIS — J189 Pneumonia, unspecified organism: Secondary | ICD-10-CM | POA: Diagnosis not present

## 2024-08-20 LAB — ACID FAST SMEAR (AFB, MYCOBACTERIA): Acid Fast Smear: NEGATIVE

## 2024-08-20 LAB — CULTURE, BLOOD (ROUTINE X 2)
Culture: NO GROWTH
Special Requests: ADEQUATE

## 2024-08-20 LAB — GLUCOSE, CAPILLARY
Glucose-Capillary: 301 mg/dL — ABNORMAL HIGH (ref 70–99)
Glucose-Capillary: 209 mg/dL — ABNORMAL HIGH (ref 70–99)
Glucose-Capillary: 175 mg/dL — ABNORMAL HIGH (ref 70–99)
Glucose-Capillary: 151 mg/dL — ABNORMAL HIGH (ref 70–99)

## 2024-08-20 LAB — CULTURE, BAL-QUANTITATIVE W GRAM STAIN: Culture: 10000 — AB

## 2024-08-20 LAB — SURGICAL PATHOLOGY

## 2024-08-20 MED ORDER — SODIUM CHLORIDE 0.9 % IV SOLN: INTRAVENOUS | Status: DC

## 2024-08-20 MED ORDER — INSULIN GLARGINE-YFGN 100 UNIT/ML ~~LOC~~ SOLN
12.0000 [IU] | Freq: Every day | SUBCUTANEOUS | Status: DC
  Administered 2024-08-21: 12 [IU] via SUBCUTANEOUS
  Filled 2024-08-20: qty 0.12

## 2024-08-20 MED ORDER — HYDROMORPHONE HCL 1 MG/ML IJ SOLN
0.5000 mg | INTRAMUSCULAR | Status: DC | PRN
  Administered 2024-08-20 – 2024-08-21 (×4): 0.5 mg via INTRAVENOUS
  Filled 2024-08-20 (×4): qty 0.5

## 2024-08-20 NOTE — Telephone Encounter (Signed)
 Please advise.

## 2024-08-20 NOTE — Progress Notes (Signed)
 Mobility Specialist - Progress Note  ([place] 3L) Pre-mobility: 95 bpm HR, 95% SpO2 During mobility: 122 bpm HR, 94% SpO2 Post-mobility: 109 bpm HR, 96% SPO2   08/20/24 0958  Mobility  Activity Ambulated with assistance  Level of Assistance Standby assist, set-up cues, supervision of patient - no hands on  Assistive Device None  Distance Ambulated (ft) 180 ft  Range of Motion/Exercises Active  Activity Response Tolerated fair  Mobility visit 1 Mobility  Mobility Specialist Start Time (ACUTE ONLY) [id number]  Mobility Specialist Stop Time (ACUTE ONLY) [id number]  Mobility Specialist Time Calculation (min) (ACUTE ONLY) 10 min   Pt was found in bed and agreeable to mobilize. Grew fatigued with session and had wheezing towards EOS. At EOS returned to bed with al needs met. Call bell in reach.   Erminio Leos,  Mobility Specialist Can be reached via Secure Chat

## 2024-08-20 NOTE — Inpatient Diabetes Management (Signed)
 Inpatient Diabetes Program Recommendations  AACE/ADA: New Consensus Statement on Inpatient Glycemic Control (2015)  Target Ranges:  Prepandial:   less than 140 mg/dL      Peak postprandial:   less than 180 mg/dL (1-2 hours)      Critically ill patients:  140 - 180 mg/dL   Lab Results  Component Value Date   GLUCAP 301 (H) 08/20/2024   HGBA1C 6.7 (H) 08/16/2024    Review of Glycemic Control  Diabetes history: DM1 (does not make insulin , needs correction, basal and meal coverage) Outpatient Diabetes medications: Lantus  27 units daily, Novolog  1 unit for every 20 mg/dL > 879 mg/dL + 1 unit for every 20 g carbohydrate TID, FS Libre 3 CGM Current orders for Inpatient glycemic control: Semglee  8 units daily, Novolog  0-15 TID with meals and 0-5 HS  Inpatient Diabetes Program Recommendations:    Consider increasing Semglee  to 12 units daily  Consider Novolog  3-4 units TID with meals if eating > 50%.  Continue to follow.  Thank you. Shona Brandy, RD, LDN, CDCES Inpatient Diabetes Coordinator [phone]

## 2024-08-20 NOTE — Plan of Care (Signed)
" °  Problem: Skin Integrity: Goal: Risk for impaired skin integrity will decrease Outcome: Progressing   Problem: Activity: Goal: Ability to tolerate increased activity will improve Outcome: Progressing   Problem: Respiratory: Goal: Ability to maintain adequate ventilation will improve Outcome: Progressing   Problem: Pain Managment: Goal: General experience of comfort will improve and/or be controlled Outcome: Progressing   Problem: Safety: Goal: Ability to remain free from injury will improve Outcome: Progressing   "

## 2024-08-21 ENCOUNTER — Other Ambulatory Visit (HOSPITAL_COMMUNITY): Payer: Self-pay

## 2024-08-21 ENCOUNTER — Other Ambulatory Visit (HOSPITAL_BASED_OUTPATIENT_CLINIC_OR_DEPARTMENT_OTHER): Payer: Self-pay

## 2024-08-21 ENCOUNTER — Telehealth (HOSPITAL_COMMUNITY): Payer: Self-pay

## 2024-08-21 LAB — CULTURE, BLOOD (ROUTINE X 2)
Culture: NO GROWTH
Special Requests: ADEQUATE

## 2024-08-21 LAB — GLUCOSE, CAPILLARY: Glucose-Capillary: 228 mg/dL — ABNORMAL HIGH (ref 70–99)

## 2024-08-21 LAB — FUNGUS CULTURE WITH STAIN

## 2024-08-21 LAB — FUNGUS CULTURE RESULT

## 2024-08-21 MED ORDER — METRONIDAZOLE 500 MG PO TABS
500.0000 mg | ORAL_TABLET | Freq: Two times a day (BID) | ORAL | 0 refills | Status: AC
  Filled 2024-08-21: qty 20, 10d supply, fill #0

## 2024-08-21 MED ORDER — LORAZEPAM 0.5 MG PO TABS
0.5000 mg | ORAL_TABLET | Freq: Four times a day (QID) | ORAL | 0 refills | Status: AC | PRN
  Filled 2024-08-21: qty 8, 2d supply, fill #0

## 2024-08-21 MED ORDER — LINEZOLID 600 MG PO TABS
600.0000 mg | ORAL_TABLET | Freq: Two times a day (BID) | ORAL | 0 refills | Status: AC
  Filled 2024-08-21: qty 20, 10d supply, fill #0

## 2024-08-21 NOTE — Progress Notes (Signed)
 Discharge medications delivered to patient at the bedside in a secure bag.

## 2024-08-21 NOTE — Telephone Encounter (Signed)
 Pharmacy Patient Advocate Encounter  Insurance verification completed.    The patient is insured through [employer]. Patient has Medicare and is not eligible for a copay card, but may be able to apply for patient assistance or Medicare RX Payment Plan (Patient Must reach out to their plan, if eligible for payment plan), if available.    Ran test claim for Linezolid  600mg  tablet and the current 14 Noa co-pay is $61.21 due to deductible.   This test claim was processed through [facility]- copay amounts may vary at other pharmacies due to pharmacy/plan contracts, or as the patient moves through the different stages of their insurance plan.

## 2024-08-21 NOTE — Care Management Important Message (Signed)
 Important Message  Patient Details IM Letter given. Name: James [name] MRN: [id number] Date of Birth: [birth year]   Important Message Given:  Yes - Medicare IM     Melba Ates 08/21/2024, 10:13 AM

## 2024-08-22 LAB — CYTOLOGY - NON PAP

## 2024-08-22 NOTE — Telephone Encounter (Signed)
 Cancelled procedure.

## 2024-08-22 NOTE — Telephone Encounter (Signed)
 Please advise.

## 2024-08-23 NOTE — Telephone Encounter (Signed)
 FYI

## 2024-08-24 ENCOUNTER — Other Ambulatory Visit (HOSPITAL_COMMUNITY): Payer: Self-pay

## 2024-08-24 MED ORDER — PREDNISONE 10 MG PO TABS
ORAL_TABLET | ORAL | 0 refills | Status: AC
  Filled 2024-08-24: qty 30, 12d supply, fill #0

## 2024-09-04 ENCOUNTER — Inpatient Hospital Stay (HOSPITAL_BASED_OUTPATIENT_CLINIC_OR_DEPARTMENT_OTHER)

## 2024-09-04 ENCOUNTER — Encounter (HOSPITAL_COMMUNITY): Payer: Self-pay

## 2024-09-04 ENCOUNTER — Ambulatory Visit (HOSPITAL_COMMUNITY): Admit: 2024-09-04 | Admitting: Emergency Medicine

## 2024-09-04 DIAGNOSIS — J189 Pneumonia, unspecified organism: Secondary | ICD-10-CM | POA: Insufficient documentation

## 2024-09-11 ENCOUNTER — Ambulatory Visit (HOSPITAL_BASED_OUTPATIENT_CLINIC_OR_DEPARTMENT_OTHER)

## 2024-10-31 ENCOUNTER — Ambulatory Visit (HOSPITAL_BASED_OUTPATIENT_CLINIC_OR_DEPARTMENT_OTHER)

## 2024-12-04 ENCOUNTER — Ambulatory Visit (HOSPITAL_BASED_OUTPATIENT_CLINIC_OR_DEPARTMENT_OTHER)
